# Patient Record
Sex: Female | Born: 1935 | Race: White | Hispanic: No | Smoking: Never smoker
Health system: Southern US, Community
[De-identification: ages and names within clinical notes are randomized; demographics above are authoritative.]

## PROBLEM LIST (undated history)

## (undated) DIAGNOSIS — I8290 Acute embolism and thrombosis of unspecified vein: Secondary | ICD-10-CM

## (undated) DIAGNOSIS — I10 Essential (primary) hypertension: Secondary | ICD-10-CM

## (undated) DIAGNOSIS — T4145XA Adverse effect of unspecified anesthetic, initial encounter: Secondary | ICD-10-CM

## (undated) DIAGNOSIS — R112 Nausea with vomiting, unspecified: Secondary | ICD-10-CM

## (undated) DIAGNOSIS — K219 Gastro-esophageal reflux disease without esophagitis: Secondary | ICD-10-CM

## (undated) DIAGNOSIS — T8859XA Other complications of anesthesia, initial encounter: Secondary | ICD-10-CM

## (undated) DIAGNOSIS — M353 Polymyalgia rheumatica: Secondary | ICD-10-CM

## (undated) DIAGNOSIS — I2510 Atherosclerotic heart disease of native coronary artery without angina pectoris: Secondary | ICD-10-CM

## (undated) DIAGNOSIS — R1310 Dysphagia, unspecified: Secondary | ICD-10-CM

## (undated) DIAGNOSIS — G2581 Restless legs syndrome: Secondary | ICD-10-CM

## (undated) DIAGNOSIS — F0390 Unspecified dementia without behavioral disturbance: Secondary | ICD-10-CM

## (undated) DIAGNOSIS — Z86711 Personal history of pulmonary embolism: Secondary | ICD-10-CM

## (undated) DIAGNOSIS — R001 Bradycardia, unspecified: Secondary | ICD-10-CM

## (undated) DIAGNOSIS — C801 Malignant (primary) neoplasm, unspecified: Secondary | ICD-10-CM

## (undated) DIAGNOSIS — R0600 Dyspnea, unspecified: Secondary | ICD-10-CM

## (undated) DIAGNOSIS — F419 Anxiety disorder, unspecified: Secondary | ICD-10-CM

## (undated) DIAGNOSIS — Z9889 Other specified postprocedural states: Secondary | ICD-10-CM

## (undated) DIAGNOSIS — R29898 Other symptoms and signs involving the musculoskeletal system: Secondary | ICD-10-CM

## (undated) DIAGNOSIS — D649 Anemia, unspecified: Secondary | ICD-10-CM

## (undated) DIAGNOSIS — L02419 Cutaneous abscess of limb, unspecified: Secondary | ICD-10-CM

## (undated) DIAGNOSIS — I200 Unstable angina: Secondary | ICD-10-CM

## (undated) DIAGNOSIS — S32020A Wedge compression fracture of second lumbar vertebra, initial encounter for closed fracture: Secondary | ICD-10-CM

## (undated) HISTORY — DX: Dyspnea, unspecified: R06.00

## (undated) HISTORY — PX: INCISION AND DRAINAGE: SHX5863

## (undated) HISTORY — DX: Dysphagia, unspecified: R13.10

## (undated) HISTORY — DX: Cutaneous abscess of limb, unspecified: L02.419

## (undated) HISTORY — DX: Anemia, unspecified: D64.9

## (undated) HISTORY — DX: Bradycardia, unspecified: R00.1

## (undated) HISTORY — DX: Personal history of pulmonary embolism: Z86.711

## (undated) HISTORY — DX: Unstable angina: I20.0

## (undated) HISTORY — PX: OTHER SURGICAL HISTORY: SHX169

## (undated) HISTORY — PX: COLECTOMY: SHX59

## (undated) HISTORY — DX: Wedge compression fracture of second lumbar vertebra, initial encounter for closed fracture: S32.020A

## (undated) HISTORY — DX: Other symptoms and signs involving the musculoskeletal system: R29.898

## (undated) HISTORY — PX: BREAST LUMPECTOMY: SHX2

---

## 1998-09-23 ENCOUNTER — Ambulatory Visit (HOSPITAL_COMMUNITY): Admission: RE | Admit: 1998-09-23 | Discharge: 1998-09-23 | Payer: Self-pay | Admitting: *Deleted

## 1998-12-24 ENCOUNTER — Ambulatory Visit (HOSPITAL_COMMUNITY): Admission: RE | Admit: 1998-12-24 | Discharge: 1998-12-24 | Payer: Self-pay | Admitting: *Deleted

## 1999-01-10 ENCOUNTER — Emergency Department (HOSPITAL_COMMUNITY): Admission: EM | Admit: 1999-01-10 | Discharge: 1999-01-10 | Payer: Self-pay | Admitting: Emergency Medicine

## 1999-01-10 ENCOUNTER — Encounter: Payer: Self-pay | Admitting: Emergency Medicine

## 1999-11-11 ENCOUNTER — Other Ambulatory Visit: Admission: RE | Admit: 1999-11-11 | Discharge: 1999-11-11 | Payer: Self-pay | Admitting: *Deleted

## 2000-03-25 ENCOUNTER — Encounter: Payer: Self-pay | Admitting: Cardiology

## 2000-03-25 ENCOUNTER — Inpatient Hospital Stay (HOSPITAL_COMMUNITY): Admission: AD | Admit: 2000-03-25 | Discharge: 2000-03-30 | Payer: Self-pay | Admitting: Cardiology

## 2000-05-12 ENCOUNTER — Encounter: Payer: Self-pay | Admitting: Emergency Medicine

## 2000-05-12 ENCOUNTER — Emergency Department (HOSPITAL_COMMUNITY): Admission: EM | Admit: 2000-05-12 | Discharge: 2000-05-12 | Payer: Self-pay | Admitting: Emergency Medicine

## 2000-11-08 ENCOUNTER — Ambulatory Visit (HOSPITAL_COMMUNITY): Admission: RE | Admit: 2000-11-08 | Discharge: 2000-11-08 | Payer: Self-pay | Admitting: *Deleted

## 2000-11-08 ENCOUNTER — Encounter (INDEPENDENT_AMBULATORY_CARE_PROVIDER_SITE_OTHER): Payer: Self-pay | Admitting: *Deleted

## 2001-01-04 ENCOUNTER — Encounter: Payer: Self-pay | Admitting: Emergency Medicine

## 2001-01-04 ENCOUNTER — Inpatient Hospital Stay (HOSPITAL_COMMUNITY): Admission: EM | Admit: 2001-01-04 | Discharge: 2001-01-10 | Payer: Self-pay | Admitting: Emergency Medicine

## 2001-05-08 ENCOUNTER — Observation Stay (HOSPITAL_COMMUNITY): Admission: EM | Admit: 2001-05-08 | Discharge: 2001-05-10 | Payer: Self-pay | Admitting: *Deleted

## 2001-05-09 ENCOUNTER — Encounter: Payer: Self-pay | Admitting: Cardiology

## 2001-08-08 ENCOUNTER — Ambulatory Visit (HOSPITAL_COMMUNITY): Admission: RE | Admit: 2001-08-08 | Discharge: 2001-08-09 | Payer: Self-pay | Admitting: Cardiology

## 2001-08-08 ENCOUNTER — Encounter: Payer: Self-pay | Admitting: Cardiology

## 2003-02-18 ENCOUNTER — Inpatient Hospital Stay (HOSPITAL_COMMUNITY): Admission: AD | Admit: 2003-02-18 | Discharge: 2003-02-20 | Payer: Self-pay | Admitting: Internal Medicine

## 2003-02-18 ENCOUNTER — Encounter: Payer: Self-pay | Admitting: Internal Medicine

## 2003-02-28 ENCOUNTER — Observation Stay (HOSPITAL_COMMUNITY): Admission: AD | Admit: 2003-02-28 | Discharge: 2003-03-01 | Payer: Self-pay | Admitting: Cardiology

## 2003-02-28 ENCOUNTER — Encounter: Payer: Self-pay | Admitting: Cardiology

## 2003-03-01 ENCOUNTER — Encounter: Payer: Self-pay | Admitting: Cardiology

## 2003-03-20 ENCOUNTER — Inpatient Hospital Stay (HOSPITAL_COMMUNITY): Admission: RE | Admit: 2003-03-20 | Discharge: 2003-03-27 | Payer: Self-pay | Admitting: Internal Medicine

## 2003-03-20 ENCOUNTER — Encounter: Payer: Self-pay | Admitting: Internal Medicine

## 2003-10-10 ENCOUNTER — Ambulatory Visit (HOSPITAL_COMMUNITY): Admission: RE | Admit: 2003-10-10 | Discharge: 2003-10-10 | Payer: Self-pay | Admitting: Internal Medicine

## 2003-11-08 ENCOUNTER — Ambulatory Visit (HOSPITAL_COMMUNITY): Admission: RE | Admit: 2003-11-08 | Discharge: 2003-11-08 | Payer: Self-pay | Admitting: Internal Medicine

## 2003-11-26 ENCOUNTER — Ambulatory Visit (HOSPITAL_COMMUNITY): Admission: RE | Admit: 2003-11-26 | Discharge: 2003-11-26 | Payer: Self-pay | Admitting: *Deleted

## 2003-11-26 ENCOUNTER — Encounter (INDEPENDENT_AMBULATORY_CARE_PROVIDER_SITE_OTHER): Payer: Self-pay | Admitting: Specialist

## 2004-01-19 ENCOUNTER — Inpatient Hospital Stay (HOSPITAL_COMMUNITY): Admission: EM | Admit: 2004-01-19 | Discharge: 2004-01-22 | Payer: Self-pay | Admitting: Emergency Medicine

## 2004-03-28 ENCOUNTER — Observation Stay (HOSPITAL_COMMUNITY): Admission: EM | Admit: 2004-03-28 | Discharge: 2004-03-30 | Payer: Self-pay | Admitting: Emergency Medicine

## 2004-09-16 ENCOUNTER — Ambulatory Visit: Payer: Self-pay | Admitting: Cardiology

## 2005-01-06 ENCOUNTER — Encounter: Admission: RE | Admit: 2005-01-06 | Discharge: 2005-01-06 | Payer: Self-pay | Admitting: Surgery

## 2005-01-06 ENCOUNTER — Encounter (INDEPENDENT_AMBULATORY_CARE_PROVIDER_SITE_OTHER): Payer: Self-pay | Admitting: Specialist

## 2005-01-06 ENCOUNTER — Encounter (INDEPENDENT_AMBULATORY_CARE_PROVIDER_SITE_OTHER): Payer: Self-pay | Admitting: Diagnostic Radiology

## 2005-01-20 ENCOUNTER — Encounter: Admission: RE | Admit: 2005-01-20 | Discharge: 2005-01-20 | Payer: Self-pay | Admitting: Obstetrics and Gynecology

## 2005-01-29 ENCOUNTER — Ambulatory Visit: Payer: Self-pay | Admitting: Cardiology

## 2005-02-08 ENCOUNTER — Ambulatory Visit: Payer: Self-pay

## 2005-02-15 ENCOUNTER — Encounter: Admission: RE | Admit: 2005-02-15 | Discharge: 2005-02-15 | Payer: Self-pay | Admitting: Surgery

## 2005-02-15 ENCOUNTER — Ambulatory Visit (HOSPITAL_COMMUNITY): Admission: RE | Admit: 2005-02-15 | Discharge: 2005-02-16 | Payer: Self-pay | Admitting: Surgery

## 2005-02-15 ENCOUNTER — Encounter (INDEPENDENT_AMBULATORY_CARE_PROVIDER_SITE_OTHER): Payer: Self-pay | Admitting: *Deleted

## 2005-02-17 ENCOUNTER — Ambulatory Visit: Payer: Self-pay | Admitting: Oncology

## 2005-02-22 ENCOUNTER — Ambulatory Visit: Payer: Self-pay | Admitting: Cardiology

## 2005-02-24 ENCOUNTER — Emergency Department (HOSPITAL_COMMUNITY): Admission: EM | Admit: 2005-02-24 | Discharge: 2005-02-24 | Payer: Self-pay | Admitting: Emergency Medicine

## 2005-03-08 ENCOUNTER — Ambulatory Visit: Admission: RE | Admit: 2005-03-08 | Discharge: 2005-05-25 | Payer: Self-pay | Admitting: *Deleted

## 2005-04-13 ENCOUNTER — Ambulatory Visit: Payer: Self-pay | Admitting: Oncology

## 2005-07-01 ENCOUNTER — Ambulatory Visit: Payer: Self-pay | Admitting: Oncology

## 2005-07-08 ENCOUNTER — Ambulatory Visit: Payer: Self-pay | Admitting: Internal Medicine

## 2005-09-03 ENCOUNTER — Ambulatory Visit: Payer: Self-pay | Admitting: Cardiology

## 2005-11-10 ENCOUNTER — Ambulatory Visit: Payer: Self-pay | Admitting: *Deleted

## 2005-12-16 ENCOUNTER — Ambulatory Visit: Payer: Self-pay | Admitting: Cardiology

## 2005-12-21 ENCOUNTER — Ambulatory Visit: Payer: Self-pay | Admitting: Oncology

## 2006-02-03 ENCOUNTER — Ambulatory Visit (HOSPITAL_COMMUNITY): Admission: RE | Admit: 2006-02-03 | Discharge: 2006-02-03 | Payer: Self-pay | Admitting: *Deleted

## 2006-02-03 ENCOUNTER — Encounter (INDEPENDENT_AMBULATORY_CARE_PROVIDER_SITE_OTHER): Payer: Self-pay | Admitting: Specialist

## 2006-02-18 ENCOUNTER — Encounter: Payer: Self-pay | Admitting: Vascular Surgery

## 2006-02-18 ENCOUNTER — Ambulatory Visit (HOSPITAL_COMMUNITY): Admission: RE | Admit: 2006-02-18 | Discharge: 2006-02-18 | Payer: Self-pay | Admitting: Internal Medicine

## 2006-02-24 ENCOUNTER — Other Ambulatory Visit: Admission: RE | Admit: 2006-02-24 | Discharge: 2006-02-24 | Payer: Self-pay | Admitting: Cardiology

## 2006-03-01 ENCOUNTER — Emergency Department (HOSPITAL_COMMUNITY): Admission: EM | Admit: 2006-03-01 | Discharge: 2006-03-02 | Payer: Self-pay | Admitting: Emergency Medicine

## 2006-03-04 ENCOUNTER — Encounter: Admission: RE | Admit: 2006-03-04 | Discharge: 2006-03-04 | Payer: Self-pay | Admitting: Surgery

## 2006-04-18 ENCOUNTER — Ambulatory Visit: Payer: Self-pay | Admitting: Oncology

## 2006-04-21 ENCOUNTER — Ambulatory Visit (HOSPITAL_COMMUNITY): Admission: RE | Admit: 2006-04-21 | Discharge: 2006-04-21 | Payer: Self-pay | Admitting: *Deleted

## 2006-04-21 ENCOUNTER — Encounter (INDEPENDENT_AMBULATORY_CARE_PROVIDER_SITE_OTHER): Payer: Self-pay | Admitting: Specialist

## 2006-05-02 ENCOUNTER — Ambulatory Visit: Payer: Self-pay | Admitting: Cardiology

## 2006-05-18 LAB — CBC WITH DIFFERENTIAL/PLATELET
BASO%: 0.6 % (ref 0.0–2.0)
Basophils Absolute: 0 10*3/uL (ref 0.0–0.1)
Eosinophils Absolute: 0.1 10*3/uL (ref 0.0–0.5)
HCT: 39.1 % (ref 34.8–46.6)
HGB: 12.9 g/dL (ref 11.6–15.9)
MCHC: 33 g/dL (ref 32.0–36.0)
MONO#: 0.4 10*3/uL (ref 0.1–0.9)
NEUT#: 4 10*3/uL (ref 1.5–6.5)
NEUT%: 70.7 % (ref 39.6–76.8)
WBC: 5.7 10*3/uL (ref 3.9–10.0)
lymph#: 1.1 10*3/uL (ref 0.9–3.3)

## 2006-05-18 LAB — COMPREHENSIVE METABOLIC PANEL
ALT: 9 U/L (ref 0–40)
CO2: 23 mEq/L (ref 19–32)
Calcium: 9.3 mg/dL (ref 8.4–10.5)
Chloride: 102 mEq/L (ref 96–112)
Creatinine, Ser: 0.77 mg/dL (ref 0.40–1.20)
Total Protein: 6.6 g/dL (ref 6.0–8.3)

## 2006-05-18 LAB — CANCER ANTIGEN 27.29: CA 27.29: 23 U/mL (ref 0–39)

## 2006-07-15 ENCOUNTER — Encounter: Admission: RE | Admit: 2006-07-15 | Discharge: 2006-07-15 | Payer: Self-pay | Admitting: Internal Medicine

## 2006-09-01 ENCOUNTER — Encounter: Admission: RE | Admit: 2006-09-01 | Discharge: 2006-09-01 | Payer: Self-pay | Admitting: Internal Medicine

## 2006-09-12 ENCOUNTER — Ambulatory Visit: Payer: Self-pay | Admitting: Oncology

## 2006-09-14 LAB — CBC WITH DIFFERENTIAL/PLATELET
EOS%: 0.8 % (ref 0.0–7.0)
Eosinophils Absolute: 0.1 10*3/uL (ref 0.0–0.5)
MCV: 85.5 fL (ref 81.0–101.0)
MONO%: 2.7 % (ref 0.0–13.0)
NEUT#: 11.1 10*3/uL — ABNORMAL HIGH (ref 1.5–6.5)
RBC: 4.47 10*6/uL (ref 3.70–5.32)
RDW: 15.2 % — ABNORMAL HIGH (ref 11.3–14.5)
lymph#: 0.4 10*3/uL — ABNORMAL LOW (ref 0.9–3.3)

## 2006-09-20 LAB — ANA: Anti Nuclear Antibody(ANA): NEGATIVE

## 2006-09-20 LAB — COMPREHENSIVE METABOLIC PANEL
Alkaline Phosphatase: 42 U/L (ref 39–117)
BUN: 17 mg/dL (ref 6–23)
Creatinine, Ser: 0.87 mg/dL (ref 0.40–1.20)
Glucose, Bld: 116 mg/dL — ABNORMAL HIGH (ref 70–99)
Total Bilirubin: 0.5 mg/dL (ref 0.3–1.2)

## 2006-09-30 ENCOUNTER — Ambulatory Visit: Payer: Self-pay | Admitting: Cardiology

## 2006-11-28 ENCOUNTER — Encounter: Admission: RE | Admit: 2006-11-28 | Discharge: 2006-11-28 | Payer: Self-pay | Admitting: Internal Medicine

## 2007-01-09 ENCOUNTER — Ambulatory Visit: Payer: Self-pay | Admitting: Oncology

## 2007-01-19 LAB — CBC WITH DIFFERENTIAL/PLATELET
Basophils Absolute: 0 10*3/uL (ref 0.0–0.1)
EOS%: 0.1 % (ref 0.0–7.0)
HCT: 40.5 % (ref 34.8–46.6)
HGB: 13.4 g/dL (ref 11.6–15.9)
LYMPH%: 6.8 % — ABNORMAL LOW (ref 14.0–48.0)
MCH: 28.7 pg (ref 26.0–34.0)
MCHC: 33 g/dL (ref 32.0–36.0)
MONO#: 0.3 10*3/uL (ref 0.1–0.9)
NEUT%: 90.8 % — ABNORMAL HIGH (ref 39.6–76.8)
Platelets: 348 10*3/uL (ref 145–400)
lymph#: 0.8 10*3/uL — ABNORMAL LOW (ref 0.9–3.3)

## 2007-01-19 LAB — COMPREHENSIVE METABOLIC PANEL
BUN: 11 mg/dL (ref 6–23)
CO2: 23 mEq/L (ref 19–32)
Calcium: 9.6 mg/dL (ref 8.4–10.5)
Chloride: 96 mEq/L (ref 96–112)
Creatinine, Ser: 0.85 mg/dL (ref 0.40–1.20)
Total Bilirubin: 0.4 mg/dL (ref 0.3–1.2)

## 2007-01-19 LAB — LACTATE DEHYDROGENASE: LDH: 119 U/L (ref 94–250)

## 2007-04-27 ENCOUNTER — Ambulatory Visit: Payer: Self-pay | Admitting: Cardiology

## 2007-05-16 ENCOUNTER — Ambulatory Visit: Payer: Self-pay | Admitting: Oncology

## 2007-08-24 ENCOUNTER — Ambulatory Visit: Payer: Self-pay | Admitting: Cardiology

## 2007-09-03 ENCOUNTER — Emergency Department (HOSPITAL_COMMUNITY): Admission: EM | Admit: 2007-09-03 | Discharge: 2007-09-03 | Payer: Self-pay | Admitting: Emergency Medicine

## 2007-11-30 ENCOUNTER — Encounter: Admission: RE | Admit: 2007-11-30 | Discharge: 2007-11-30 | Payer: Self-pay | Admitting: Internal Medicine

## 2007-12-01 ENCOUNTER — Ambulatory Visit: Payer: Self-pay | Admitting: Cardiology

## 2007-12-04 ENCOUNTER — Ambulatory Visit: Payer: Self-pay | Admitting: Internal Medicine

## 2007-12-18 ENCOUNTER — Ambulatory Visit: Payer: Self-pay | Admitting: Cardiology

## 2007-12-28 ENCOUNTER — Ambulatory Visit (HOSPITAL_COMMUNITY): Admission: RE | Admit: 2007-12-28 | Discharge: 2007-12-28 | Payer: Self-pay | Admitting: *Deleted

## 2007-12-28 ENCOUNTER — Encounter (INDEPENDENT_AMBULATORY_CARE_PROVIDER_SITE_OTHER): Payer: Self-pay | Admitting: *Deleted

## 2008-04-08 ENCOUNTER — Ambulatory Visit: Payer: Self-pay | Admitting: Oncology

## 2008-04-10 LAB — CBC WITH DIFFERENTIAL/PLATELET
BASO%: 0.3 % (ref 0.0–2.0)
Basophils Absolute: 0 10*3/uL (ref 0.0–0.1)
Eosinophils Absolute: 0.1 10*3/uL (ref 0.0–0.5)
HCT: 41.2 % (ref 34.8–46.6)
HGB: 13.8 g/dL (ref 11.6–15.9)
LYMPH%: 17.9 % (ref 14.0–48.0)
MONO#: 0.6 10*3/uL (ref 0.1–0.9)
NEUT#: 7.2 10*3/uL — ABNORMAL HIGH (ref 1.5–6.5)
NEUT%: 74.6 % (ref 39.6–76.8)
Platelets: 261 10*3/uL (ref 145–400)
WBC: 9.7 10*3/uL (ref 3.9–10.0)
lymph#: 1.7 10*3/uL (ref 0.9–3.3)

## 2008-04-10 LAB — COMPREHENSIVE METABOLIC PANEL
ALT: 12 U/L (ref 0–35)
BUN: 12 mg/dL (ref 6–23)
CO2: 24 mEq/L (ref 19–32)
Calcium: 9.2 mg/dL (ref 8.4–10.5)
Chloride: 102 mEq/L (ref 96–112)
Creatinine, Ser: 0.89 mg/dL (ref 0.40–1.20)
Glucose, Bld: 105 mg/dL — ABNORMAL HIGH (ref 70–99)

## 2008-04-10 LAB — CANCER ANTIGEN 27.29: CA 27.29: 23 U/mL (ref 0–39)

## 2008-04-10 LAB — LACTATE DEHYDROGENASE: LDH: 142 U/L (ref 94–250)

## 2008-04-24 ENCOUNTER — Ambulatory Visit: Payer: Self-pay | Admitting: Cardiology

## 2008-07-30 ENCOUNTER — Ambulatory Visit: Payer: Self-pay | Admitting: Cardiology

## 2008-08-09 ENCOUNTER — Encounter: Admission: RE | Admit: 2008-08-09 | Discharge: 2008-08-09 | Payer: Self-pay | Admitting: Internal Medicine

## 2008-09-12 ENCOUNTER — Ambulatory Visit (HOSPITAL_COMMUNITY): Admission: RE | Admit: 2008-09-12 | Discharge: 2008-09-12 | Payer: Self-pay | Admitting: Internal Medicine

## 2008-10-01 ENCOUNTER — Ambulatory Visit: Payer: Self-pay | Admitting: Oncology

## 2008-11-18 ENCOUNTER — Ambulatory Visit: Payer: Self-pay | Admitting: Cardiology

## 2008-11-18 ENCOUNTER — Observation Stay (HOSPITAL_COMMUNITY): Admission: EM | Admit: 2008-11-18 | Discharge: 2008-11-20 | Payer: Self-pay | Admitting: Emergency Medicine

## 2008-12-11 ENCOUNTER — Ambulatory Visit: Payer: Self-pay | Admitting: Cardiology

## 2008-12-11 DIAGNOSIS — E785 Hyperlipidemia, unspecified: Secondary | ICD-10-CM

## 2008-12-11 DIAGNOSIS — I10 Essential (primary) hypertension: Secondary | ICD-10-CM | POA: Insufficient documentation

## 2008-12-17 ENCOUNTER — Ambulatory Visit: Payer: Self-pay

## 2009-01-01 ENCOUNTER — Ambulatory Visit: Payer: Self-pay | Admitting: Cardiology

## 2009-02-11 ENCOUNTER — Telehealth: Payer: Self-pay | Admitting: Cardiology

## 2009-04-15 ENCOUNTER — Emergency Department (HOSPITAL_COMMUNITY): Admission: EM | Admit: 2009-04-15 | Discharge: 2009-04-15 | Payer: Self-pay | Admitting: Emergency Medicine

## 2009-05-08 ENCOUNTER — Encounter: Admission: RE | Admit: 2009-05-08 | Discharge: 2009-05-08 | Payer: Self-pay | Admitting: Internal Medicine

## 2009-08-22 ENCOUNTER — Encounter (INDEPENDENT_AMBULATORY_CARE_PROVIDER_SITE_OTHER): Payer: Self-pay | Admitting: *Deleted

## 2009-08-27 ENCOUNTER — Ambulatory Visit: Payer: Self-pay | Admitting: Cardiology

## 2009-08-27 DIAGNOSIS — E7800 Pure hypercholesterolemia, unspecified: Secondary | ICD-10-CM | POA: Insufficient documentation

## 2009-09-05 ENCOUNTER — Encounter: Payer: Self-pay | Admitting: Cardiology

## 2010-09-28 ENCOUNTER — Ambulatory Visit: Payer: Self-pay | Admitting: Cardiology

## 2010-10-01 DIAGNOSIS — I8290 Acute embolism and thrombosis of unspecified vein: Secondary | ICD-10-CM

## 2010-10-01 HISTORY — DX: Acute embolism and thrombosis of unspecified vein: I82.90

## 2010-10-05 ENCOUNTER — Encounter: Admission: RE | Admit: 2010-10-05 | Discharge: 2010-10-05 | Payer: Self-pay | Admitting: Internal Medicine

## 2010-10-05 ENCOUNTER — Encounter: Payer: Self-pay | Admitting: Cardiology

## 2010-10-05 ENCOUNTER — Inpatient Hospital Stay (HOSPITAL_COMMUNITY)
Admission: EM | Admit: 2010-10-05 | Discharge: 2010-10-08 | Payer: Self-pay | Source: Home / Self Care | Attending: Internal Medicine | Admitting: Internal Medicine

## 2010-10-06 ENCOUNTER — Encounter (INDEPENDENT_AMBULATORY_CARE_PROVIDER_SITE_OTHER): Payer: Self-pay | Admitting: Internal Medicine

## 2010-11-21 ENCOUNTER — Encounter: Payer: Self-pay | Admitting: Internal Medicine

## 2010-11-22 ENCOUNTER — Encounter: Payer: Self-pay | Admitting: Oncology

## 2010-11-22 ENCOUNTER — Encounter: Payer: Self-pay | Admitting: Internal Medicine

## 2010-11-22 ENCOUNTER — Encounter: Payer: Self-pay | Admitting: *Deleted

## 2010-12-03 NOTE — Letter (Signed)
Summary: [facility]  [facility]   Imported By: Marylou Mccoy 10/09/2010 12:55:52  _____________________________________________________________________  External Attachment:    Type:   Image     Comment:   External Document

## 2010-12-03 NOTE — Assessment & Plan Note (Signed)
Summary: f1y   Visit Type:  Follow-up Primary Monseratt Ledin:  Renne Crigler  CC:  sob.  History of Present Illness: Doing well on cardiac basis. Denies any chest pain whatsoever.  Feels good overall.  Has taken Protonix in the past, but not recently.  Likes nexium   Problems Prior to Update: 1)  Hypercholesterolemia Iia  (ICD-272.0) 2)  Hyperlipidemia-mixed  (ICD-272.4) 3)  Hypertension, Benign  (ICD-401.1) 4)  Cad, Native Vessel  (ICD-414.01)  Current Medications (verified): 1)  Nitroglycerin 0.4 Mg Subl (Nitroglycerin) 2)  Nexium 40 Mg Cpdr (Esomeprazole Magnesium) .... Take 1 Capsule By Mouth Once A Day 3)  Ambien 5 Mg Tabs (Zolpidem Tartrate) .... 1/2 To 1 Tablet At Bedtime 4)  Plavix 75 Mg Tabs (Clopidogrel Bisulfate) .... Take One Tablet By Mouth Daily 5)  Aspirin 81 Mg Tbec (Aspirin) .... Take One Tablet By Mouth Daily 6)  Amlodipine Besylate 10 Mg Tabs (Amlodipine Besylate) .... Take One Daily 7)  Requip 0.5 Mg Tabs (Ropinirole Hcl) .... Take 1 Tab By Mouth At Bedtime 8)  Vitamin D 1000 Unit  Tabs (Cholecalciferol) .... Take 1 Tablet By Mouth Once A Day 9)  Prednisone 1 Mg Tabs (Prednisone) .... Take 1 Tablet By Mouth Two Times A Day 10)  Calcium Carbonate-Vitamin D 600-400 Mg-Unit  Tabs (Calcium Carbonate-Vitamin D) .... Take 1 Tablet By Mouth Once A Day 11)  Tramadol Hcl 50 Mg Tabs (Tramadol Hcl) .... As Needed 12)  Tylenol 325 Mg Tabs (Acetaminophen) .... As Needed 13)  Lipitor 20 Mg Tabs (Atorvastatin Calcium) .... Take One Every Other Night 14)  Losartan Potassium 100 Mg Tabs (Losartan Potassium) .... 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  Alcohol Use - no  Vital Signs:  Patient profile:   77 year old female Height:      61 inches Weight:      133 pounds BMI:     25.22 Pulse rate:   60 / minute Pulse rhythm:   regular BP sitting:   122 / 68  (left arm)  Vitals Entered By: Jacquelin Hawking, CMA (September 28, 2010 10:32 AM)  Physical Exam  General:  Well developed, well nourished, in no acute distress. Head:  normocephalic and atraumatic Eyes:  PERRLA/EOM intact; conjunctiva and lids normal. Lungs:  Clear bilaterally to auscultation and percussion. Heart:  PMI non displaced.  Normal S1 and S2.  Soft SEM.  No DM noted.   Pulses:  pulses normal in all 4 extremities Extremities:  No clubbing or cyanosis. Neurologic:  Alert and oriented x 3.   Impression & Recommendations:  Problem # 1:  CAD, NATIVE VESSEL (ICD-414.01) No symptoms.  Continues to do well.  Will replace Nexium with protonix for a trial given Plavix.  The following medications were removed from the medication list:    Imdur 60 Mg Xr24h-tab (Isosorbide mononitrate) .Marland Kitchen... Take 1 tablet by mouth once a day Her updated medication list for this problem includes:    Nitroglycerin 0.4 Mg Subl (Nitroglycerin)    Plavix 75 Mg Tabs (Clopidogrel bisulfate) .Marland Kitchen... Take one tablet by mouth daily    Aspirin 81 Mg Tbec  (Aspirin) .Marland Kitchen... Take one tablet by mouth daily    Amlodipine Besylate 10 Mg Tabs (Amlodipine besylate) .Marland Kitchen... Take one daily  Problem # 2:  HYPERLIPIDEMIA-MIXED (ICD-272.4) followed by Dr. Renne Crigler The following medications were removed from the medication list:    Zocor 40 Mg Tabs (Simvastatin) .Marland Kitchen... Take 1 tablet by mouth once a day Her updated medication list for this problem includes:    Lipitor 20 Mg Tabs (Atorvastatin calcium) .Marland Kitchen... Take one every other night  Problem # 3:  HYPERTENSION, BENIGN (ICD-401.1) controlled. Her updated medication list for this problem includes:    Aspirin 81 Mg Tbec (Aspirin) .Marland Kitchen... Take one tablet by mouth daily    Amlodipine Besylate 10 Mg Tabs (Amlodipine besylate) .Marland Kitchen... Take one daily    Losartan Potassium 100 Mg Tabs (Losartan potassium) .Marland Kitchen... Take 1/2 daily  Patient Instructions: 1)  Your physician has recommended you make the following change in your medication: STOP Nexium, START Pantoprazole 40mg  one tablet daily 2)  Your physician wants you to follow-up in:   1YEAR. You will receive a reminder letter in the mail two months in advance. If you don't receive a letter, please call our office to schedule the follow-up appointment. Prescriptions: PANTOPRAZOLE SODIUM 40 MG TBEC (PANTOPRAZOLE SODIUM) take one tablet by mouth once daily  #30 x 2   Entered by:   Julieta Gutting, RN, BSN   Authorized by:   Ronaldo Miyamoto, MD, [facility]   Signed by:   Julieta Gutting, RN, BSN on 09/28/2010   Method used:   Electronically to        [facility]  [address] [id number] (retail)       [address]       [place], [place]  [address]       Ph: [phone] or [phone]       Fax: [phone]   RxID:   [id number]

## 2010-12-03 NOTE — Letter (Signed)
Summary: [facility] Office Note  [facility] Office Note   Imported By: Roderic Ovens 04/21/2010 13:48:21  _____________________________________________________________________  External Attachment:    Type:   Image     Comment:   External Document

## 2011-01-12 LAB — CARDIAC PANEL(CRET KIN+CKTOT+MB+TROPI)
Relative Index: INVALID (ref 0.0–2.5)
Relative Index: INVALID (ref 0.0–2.5)
Total CK: 48 U/L (ref 7–177)
Troponin I: 0.03 ng/mL (ref 0.00–0.06)
Troponin I: 0.04 ng/mL (ref 0.00–0.06)

## 2011-01-12 LAB — DIFFERENTIAL
Basophils Absolute: 0 10*3/uL (ref 0.0–0.1)
Basophils Absolute: 0.1 10*3/uL (ref 0.0–0.1)
Eosinophils Relative: 0 % (ref 0–5)
Lymphocytes Relative: 27 % (ref 12–46)
Lymphs Abs: 1.2 10*3/uL (ref 0.7–4.0)
Monocytes Absolute: 0.7 10*3/uL (ref 0.1–1.0)
Monocytes Relative: 8 % (ref 3–12)
Neutro Abs: 5.6 10*3/uL (ref 1.7–7.7)
Neutro Abs: 7.9 10*3/uL — ABNORMAL HIGH (ref 1.7–7.7)

## 2011-01-12 LAB — CBC
HCT: 34.9 % — ABNORMAL LOW (ref 36.0–46.0)
HCT: 35 % — ABNORMAL LOW (ref 36.0–46.0)
HCT: 40.2 % (ref 36.0–46.0)
MCH: 28.7 pg (ref 26.0–34.0)
MCH: 28.9 pg (ref 26.0–34.0)
MCH: 29.6 pg (ref 26.0–34.0)
MCHC: 32.6 g/dL (ref 30.0–36.0)
MCHC: 32.6 g/dL (ref 30.0–36.0)
MCV: 87.5 fL (ref 78.0–100.0)
MCV: 88 fL (ref 78.0–100.0)
MCV: 88.2 fL (ref 78.0–100.0)
MCV: 88.8 fL (ref 78.0–100.0)
Platelets: 235 10*3/uL (ref 150–400)
Platelets: 239 10*3/uL (ref 150–400)
RBC: 3.99 MIL/uL (ref 3.87–5.11)
RBC: 4.09 MIL/uL (ref 3.87–5.11)
RDW: 13.7 % (ref 11.5–15.5)
RDW: 13.8 % (ref 11.5–15.5)
RDW: 13.9 % (ref 11.5–15.5)
WBC: 10 10*3/uL (ref 4.0–10.5)
WBC: 7.9 10*3/uL (ref 4.0–10.5)

## 2011-01-12 LAB — BASIC METABOLIC PANEL
BUN: 7 mg/dL (ref 6–23)
Chloride: 104 mEq/L (ref 96–112)
Creatinine, Ser: 0.86 mg/dL (ref 0.4–1.2)
Glucose, Bld: 110 mg/dL — ABNORMAL HIGH (ref 70–99)
Potassium: 3.8 mEq/L (ref 3.5–5.1)

## 2011-01-12 LAB — PROTHROMBIN GENE MUTATION

## 2011-01-12 LAB — PROTEIN S ACTIVITY: Protein S Activity: 96 % (ref 69–129)

## 2011-01-12 LAB — PROTIME-INR: INR: 1.21 (ref 0.00–1.49)

## 2011-01-12 LAB — COMPREHENSIVE METABOLIC PANEL
Albumin: 3.1 g/dL — ABNORMAL LOW (ref 3.5–5.2)
BUN: 7 mg/dL (ref 6–23)
Chloride: 109 mEq/L (ref 96–112)
Creatinine, Ser: 0.72 mg/dL (ref 0.4–1.2)
GFR calc non Af Amer: >60 mL/min (ref >60)
Total Bilirubin: 0.5 mg/dL (ref 0.3–1.2)

## 2011-01-12 LAB — CK TOTAL AND CKMB (NOT AT ARMC)
CK, MB: 0.9 ng/mL (ref 0.3–4.0)
Relative Index: INVALID (ref 0.0–2.5)
Total CK: 41 U/L (ref 7–177)

## 2011-01-12 LAB — LUPUS ANTICOAGULANT PANEL
PTT Lupus Anticoagulant: 168.2 secs — ABNORMAL HIGH (ref 30.0–45.6)
PTTLA 4:1 Mix: 138.8 secs — ABNORMAL HIGH (ref 30.0–45.6)
PTTLA Confirmation: 0.1 secs (ref <8.0)

## 2011-01-12 LAB — HEPARIN LEVEL (UNFRACTIONATED): Heparin Unfractionated: 0.65 IU/mL (ref 0.30–0.70)

## 2011-01-12 LAB — FACTOR 5 LEIDEN

## 2011-01-12 LAB — HOMOCYSTEINE: Homocysteine: 12.7 umol/L (ref 4.0–15.4)

## 2011-01-12 LAB — PROTEIN C ACTIVITY: Protein C Activity: 199 % — ABNORMAL HIGH (ref 75–133)

## 2011-02-08 LAB — DIFFERENTIAL
Eosinophils Relative: 2 % (ref 0–5)
Lymphocytes Relative: 24 % (ref 12–46)
Lymphs Abs: 1.5 10*3/uL (ref 0.7–4.0)
Monocytes Absolute: 0.4 10*3/uL (ref 0.1–1.0)
Monocytes Relative: 7 % (ref 3–12)
Neutro Abs: 4 10*3/uL (ref 1.7–7.7)

## 2011-02-08 LAB — CBC
HCT: 37.4 % (ref 36.0–46.0)
Hemoglobin: 12.5 g/dL (ref 12.0–15.0)
Platelets: 199 10*3/uL (ref 150–400)
WBC: 6.1 10*3/uL (ref 4.0–10.5)

## 2011-02-08 LAB — COMPREHENSIVE METABOLIC PANEL
AST: 15 U/L (ref 0–37)
Albumin: 3.5 g/dL (ref 3.5–5.2)
BUN: 9 mg/dL (ref 6–23)
Calcium: 8.7 mg/dL (ref 8.4–10.5)
Chloride: 107 mEq/L (ref 96–112)
Creatinine, Ser: 0.84 mg/dL (ref 0.4–1.2)
GFR calc Af Amer: >60 mL/min (ref >60)
Total Bilirubin: 0.4 mg/dL (ref 0.3–1.2)
Total Protein: 6 g/dL (ref 6.0–8.3)

## 2011-02-08 LAB — URINALYSIS, ROUTINE W REFLEX MICROSCOPIC
Glucose, UA: NEGATIVE mg/dL
Nitrite: NEGATIVE
Protein, ur: NEGATIVE mg/dL
pH: 7.5 (ref 5.0–8.0)

## 2011-02-08 LAB — CK TOTAL AND CKMB (NOT AT ARMC)
CK, MB: 0.8 ng/mL (ref 0.3–4.0)
Total CK: 45 U/L (ref 7–177)

## 2011-02-15 LAB — BASIC METABOLIC PANEL
BUN: 11 mg/dL (ref 6–23)
CO2: 23 mEq/L (ref 19–32)
GFR calc non Af Amer: >60 mL/min (ref >60)
Glucose, Bld: 126 mg/dL — ABNORMAL HIGH (ref 70–99)
Potassium: 4.1 mEq/L (ref 3.5–5.1)

## 2011-02-15 LAB — LIPID PANEL
HDL: 58 mg/dL (ref >39)
LDL Cholesterol: 105 mg/dL — ABNORMAL HIGH (ref 0–99)
Triglycerides: 55 mg/dL (ref <150)

## 2011-02-15 LAB — COMPREHENSIVE METABOLIC PANEL
ALT: 15 U/L (ref 0–35)
Albumin: 3.6 g/dL (ref 3.5–5.2)
Alkaline Phosphatase: 42 U/L (ref 39–117)
GFR calc Af Amer: >60 mL/min (ref >60)
Potassium: 3.1 mEq/L — ABNORMAL LOW (ref 3.5–5.1)
Sodium: 137 mEq/L (ref 135–145)
Total Protein: 6.6 g/dL (ref 6.0–8.3)

## 2011-02-15 LAB — CBC
Hemoglobin: 13.9 g/dL (ref 12.0–15.0)
MCHC: 33 g/dL (ref 30.0–36.0)
RBC: 4.9 MIL/uL (ref 3.87–5.11)
WBC: 7.4 10*3/uL (ref 4.0–10.5)

## 2011-02-15 LAB — DIFFERENTIAL
Basophils Relative: 0 % (ref 0–1)
Eosinophils Absolute: 0.1 10*3/uL (ref 0.0–0.7)
Lymphs Abs: 1.5 10*3/uL (ref 0.7–4.0)
Monocytes Absolute: 0.5 10*3/uL (ref 0.1–1.0)
Monocytes Relative: 6 % (ref 3–12)

## 2011-02-15 LAB — CK TOTAL AND CKMB (NOT AT ARMC)
CK, MB: 0.7 ng/mL (ref 0.3–4.0)
Total CK: 33 U/L (ref 7–177)

## 2011-02-15 LAB — CARDIAC PANEL(CRET KIN+CKTOT+MB+TROPI)
CK, MB: 1.1 ng/mL (ref 0.3–4.0)
Relative Index: INVALID (ref 0.0–2.5)
Total CK: 35 U/L (ref 7–177)
Total CK: 36 U/L (ref 7–177)

## 2011-02-15 LAB — APTT: aPTT: 24 seconds (ref 24–37)

## 2011-02-15 LAB — PROTIME-INR: Prothrombin Time: 12.7 seconds (ref 11.6–15.2)

## 2011-02-15 LAB — TROPONIN I: Troponin I: 0.01 ng/mL (ref 0.00–0.06)

## 2011-02-15 LAB — BRAIN NATRIURETIC PEPTIDE: Pro B Natriuretic peptide (BNP): 43 pg/mL (ref 0.0–100.0)

## 2011-02-18 ENCOUNTER — Emergency Department (HOSPITAL_COMMUNITY)
Admission: EM | Admit: 2011-02-18 | Discharge: 2011-02-18 | Disposition: A | Discharge status: Home / Self Care | Payer: Medicare Other | Attending: Emergency Medicine | Admitting: Emergency Medicine

## 2011-02-18 DIAGNOSIS — R110 Nausea: Secondary | ICD-10-CM | POA: Insufficient documentation

## 2011-02-18 DIAGNOSIS — R634 Abnormal weight loss: Secondary | ICD-10-CM | POA: Insufficient documentation

## 2011-02-18 DIAGNOSIS — I2510 Atherosclerotic heart disease of native coronary artery without angina pectoris: Secondary | ICD-10-CM | POA: Insufficient documentation

## 2011-02-18 DIAGNOSIS — I10 Essential (primary) hypertension: Secondary | ICD-10-CM | POA: Insufficient documentation

## 2011-02-18 DIAGNOSIS — I252 Old myocardial infarction: Secondary | ICD-10-CM | POA: Insufficient documentation

## 2011-02-18 DIAGNOSIS — E7800 Pure hypercholesterolemia, unspecified: Secondary | ICD-10-CM | POA: Insufficient documentation

## 2011-02-18 DIAGNOSIS — R5383 Other fatigue: Secondary | ICD-10-CM | POA: Insufficient documentation

## 2011-02-18 DIAGNOSIS — R197 Diarrhea, unspecified: Secondary | ICD-10-CM | POA: Insufficient documentation

## 2011-02-18 DIAGNOSIS — K5900 Constipation, unspecified: Secondary | ICD-10-CM | POA: Insufficient documentation

## 2011-02-18 DIAGNOSIS — R5381 Other malaise: Secondary | ICD-10-CM | POA: Insufficient documentation

## 2011-02-18 LAB — CBC
HCT: 37.6 % (ref 36.0–46.0)
Hemoglobin: 12.7 g/dL (ref 12.0–15.0)
MCH: 28.5 pg (ref 26.0–34.0)
MCHC: 33.8 g/dL (ref 30.0–36.0)
MCV: 84.5 fL (ref 78.0–100.0)
Platelets: 256 10*3/uL (ref 150–400)
RBC: 4.45 MIL/uL (ref 3.87–5.11)
RDW: 13.2 % (ref 11.5–15.5)
WBC: 6.9 10*3/uL (ref 4.0–10.5)

## 2011-02-18 LAB — COMPREHENSIVE METABOLIC PANEL WITH GFR
ALT: 12 U/L (ref 0–35)
AST: 17 U/L (ref 0–37)
Albumin: 3.7 g/dL (ref 3.5–5.2)
Alkaline Phosphatase: 40 U/L (ref 39–117)
BUN: 9 mg/dL (ref 6–23)
CO2: 24 meq/L (ref 19–32)
Calcium: 8.9 mg/dL (ref 8.4–10.5)
Chloride: 102 meq/L (ref 96–112)
Creatinine, Ser: 0.75 mg/dL (ref 0.4–1.2)
GFR calc non Af Amer: >60 mL/min
Glucose, Bld: 105 mg/dL — ABNORMAL HIGH (ref 70–99)
Potassium: 3.8 meq/L (ref 3.5–5.1)
Sodium: 133 meq/L — ABNORMAL LOW (ref 135–145)
Total Bilirubin: 0.4 mg/dL (ref 0.3–1.2)
Total Protein: 6.4 g/dL (ref 6.0–8.3)

## 2011-02-18 LAB — URINALYSIS, ROUTINE W REFLEX MICROSCOPIC
Glucose, UA: NEGATIVE mg/dL
Ketones, ur: NEGATIVE mg/dL
Leukocytes, UA: NEGATIVE
Nitrite: NEGATIVE
Protein, ur: NEGATIVE mg/dL
pH: 6.5 (ref 5.0–8.0)

## 2011-02-18 LAB — DIFFERENTIAL
Basophils Absolute: 0 10*3/uL (ref 0.0–0.1)
Basophils Relative: 1 % (ref 0–1)
Eosinophils Absolute: 0.1 10*3/uL (ref 0.0–0.7)
Eosinophils Relative: 1 % (ref 0–5)
Lymphocytes Relative: 16 % (ref 12–46)
Lymphs Abs: 1.1 10*3/uL (ref 0.7–4.0)
Monocytes Absolute: 0.5 10*3/uL (ref 0.1–1.0)
Monocytes Relative: 7 % (ref 3–12)
Neutro Abs: 5.2 10*3/uL (ref 1.7–7.7)
Neutrophils Relative %: 75 % (ref 43–77)

## 2011-02-18 LAB — URINE MICROSCOPIC-ADD ON

## 2011-02-18 LAB — PROTIME-INR: Prothrombin Time: 30.5 seconds — ABNORMAL HIGH (ref 11.6–15.2)

## 2011-03-16 NOTE — Assessment & Plan Note (Signed)
[facility]                            CARDIOLOGY OFFICE NOTE   PHOENIX, RIESEN                      MRN:          [id number]  DATE:12/11/2008                            DOB:          Mar 05, 1921    PRIMARY CARDIOLOGIST:  Madolyn Frieze. Jens Som, MD, [facility]   PRIMARY CARE Leor Whyte:  Arturo Morton. Riley Kill, MD, [facility]   HISTORY OF PRESENT ILLNESS:  This is a very pleasant 77 year old white  female patient of Dr. Bonnee Quin who was recently hospitalized for  dyspnea.  She had a CT angio for chest, January 18, showing no evidence  of pulmonary emboli or acute abnormality.  A 4% T9 compression fracture  was found without bony retropulsions.  The patient's metoprolol was  stopped because of a slow heart rate and weakness.  She says that has  gotten a little bit better.   Since the patient has been home from the hospital, she continues to have  dyspnea on exertion.  She says while trying to make dinner last night,  she became short of breath, and she has a tightening that begins in her  back and comes around to the front of her chest.  She also has mild  discomfort in her upper chest and left arm.  If she sits down, it goes  away within 5 minutes.  This started when her sister passed away at the  end of 10/19/23 whom she was very close to.  She has been under a lot of  stress, and since that time, this has progressively worsened.  She  denies any rest pain.  She says this occurs only when she tries to do  something.  She says she cannot make her bed, go for walks, or do any  physical exertion at this time.  Since the metoprolol was stopped, she  says the weakness has gotten a little bit better, but she says it did  help her relax when she took it in the evening, and she misses that  feeling because she is quite anxious over her sister's passing.   CURRENT MEDICATIONS:  1. Nexium 40 mg daily.  2. Zocor 40 mg daily.  3. Ambien 5 mg half to one q.h.s.  4. Plavix  75 mg daily.  5. Aspirin 81 mg daily.  6. Norvasc 5 mg daily.  7. Actonel 150 mg monthly.  8. Ropinirole HCL 0.5 mg daily.  9. Vitamin D 1000 mg daily.  10.Prednisone 3 mg daily.   PHYSICAL EXAMINATION:  GENERAL:  This is a pleasant young-looking 82-  year-old white female in no acute distress.  VITAL SIGNS:  Blood pressure 156/77, pulse 60, weight 140.  NECK:  Without any JVD, HJR, bruit, or thyroid enlargement.  LUNGS:  Decreased breath sounds but clear anterior posterior lateral.  HEART:  Regular rate and rhythm at 60 beats per minute.  Normal S1 and  S2.  No murmur, rub, bruit, thrill, or heave noted.  ABDOMEN:  Soft without organomegaly, masses, lesions, or abnormal  tenderness.  EXTREMITIES:  Trace ankle edema on the right, no edema on the left.  EKG:  Normal sinus rhythm.  No acute change.   IMPRESSION:  1. Dyspnea on exertion associated with chest and left arm pain      worrisome for ischemia.  2. Coronary artery disease status post 3-tandem stents in the proximal      and mid-right coronary artery in March 2002 followed by stent to      the ostium of the right coronary artery and mid-right coronary      artery in April 2004.  3. Prior stents to the left anterior descending artery in 1998.  4. History of pulmonary embolus.  Recent CT negative for pulmonary      embolism.  5. Hypertension.  6. Hyperlipidemia.  7. History of breast cancer.  8. Gastroesophageal reflux disease.  9. History of renal calculi.  10.History of colonic tumor in 1943 with ischemic bowel requiring      abdominal surgery.  11.History of nephrolithiasis status post removal of stone many years      ago.   PLAN:  I had a long discussion with the patient and her son concerning  her symptoms.  I am concerned that she is having ischemia and talked to  her about possible cardiac catheterization versus stress testing.  Given  her age, she is reluctant to proceed with admission to the hospital and   cardiac catheterization.  Because of this, I will start her on Imdur 30  mg daily and schedule her for an adenosine Cardiolite to rule out  ischemia.  I have asked her to call if she had any progression of her  symptoms or symptoms at rest.  If she has any prolonged pain, I have  asked her to go to the emergency room.  We will schedule her to see Dr.  Riley Kill back in followup.      Jacolyn Reedy, PA-C  Electronically Signed      Madolyn Frieze. Jens Som, MD, [facility]  Electronically Signed   ML/MedQ  DD: 12/11/2008  DT: 12/11/2008  Job #: [id number]

## 2011-03-16 NOTE — Op Note (Signed)
NAME:  [name] [name], [name] [name]               ACCOUNT NO.:  [id number]   MEDICAL RECORD NO.:  [id number]          PATIENT TYPE:  AMB   LOCATION:  ENDO                         FACILITY:  [facility]   PHYSICIAN:  Georgiana Spinner, M.D.    DATE OF BIRTH:  [birth year]   DATE OF PROCEDURE:  DATE OF DISCHARGE:                               OPERATIVE REPORT   PROCEDURE:  Colonoscopy.   INDICATIONS:  Colon polyps.   ANESTHESIA:  Fentanyl 125 mcg, Versed 12.5 mg.   PROCEDURE:  With the patient mildly sedated in the left lateral  decubitus position, the Pentax videoscopic colonoscope was inserted in  the rectum and passed under direct vision to the cecum identified by the  ileocecal valve and appendiceal orifice both of which were photographed.  In the cecum was a polyp which was photographed and removed using snare  cautery technique setting of 20/150 blended current and was removed  using a Roth retrieval which we then used to bring the polyp with the  endoscope all the way out. We then reinserted the colonoscope and then  again passed it under direct vision to view the cecum and ascending  colon. From this point, the colonoscope was then slowly withdrawn taking  circumferential views of the colonic mucosa stopping at the hepatic  flexure area where a small polyp was seen, photographed and removed  again using snare cautery technique with the same setting of 20/150  blended current. Two to three folds distal to this was another polyp  fairly large, somewhat flat but with some raised portions. This was  photographed and getting once into the correct positioning I was able to  remove most of the polyp using the snare cautery technique and  suctioning the polyp tissue into the scope for retrieval.  We then used  the ERBE argon photocoagulator to eradicate any residual polyp tissue.  The endoscope was then withdrawn all the way to the rectum which  appeared normal on direct and showed hemorrhoids on retroflexed  view.  The endoscope was straightened and withdrawn.  The patient's vital signs  and pulse oximeter remained stable.  The patient tolerated the procedure  well without apparent complications.   FINDINGS:  Multiple polyps involving the hepatic flexure area, cecum and  just distal to the hepatic flexure in the transverse colon.   PLAN:  Await biopsy reports.  The patient will call me for results and  follow-up with me as an outpatient.           ______________________________  Georgiana Spinner, M.D.     GMO/MEDQ  D:  12/28/2007  T:  12/29/2007  Job:  [id number]

## 2011-03-16 NOTE — Assessment & Plan Note (Signed)
[facility]                            CARDIOLOGY OFFICE NOTE   SANDEE, BERNATH                      MRN:          [id number]  DATE:07/30/2008                            DOB:          [birth year]    Ms. Cleere is in for followup.  She has not been having any exertional  chest discomfort.  She is accompanied by her son.  She does state that  she does get sweaty when she gets at a certain level of activity.  She  denies any chest tightness.  Her last catheterization in 2004 did not  demonstrate significant restenosis.  She has not had any other typical  type of chest tightness that she had prior to all of this, however.  She  has also been on prednisone, and has been in the process of tapering  from the prednisone dosing.   Her current medications include Nexium 40 mg daily, Zocor 40 mg daily,  Ambien 5 mg one-half tablet to one tablet nightly, Plavix 75 mg daily,  metoprolol 25 mg one-half daily, aspirin 81 mg daily, Norvasc 5 mg  daily, prednisone 6 mg daily, Actonel 150 mg per month, ropinirole 0.5  mg daily, and vitamin D.   On physical, she is alert and oriented.  The weight is 146, blood  pressure 128/80, the pulse is 52.  The lung fields are clear.  There is  a minimal systolic ejection murmur.  Extremities reveal trace edema  bilaterally with no preference.   The electrocardiogram demonstrates sinus bradycardia, otherwise  unremarkable.   IMPRESSION:  1. Coronary artery disease status post multiple percutaneous coronary      interventions.  2. Hypercholesterolemia, on lipid-lowering therapy.  3. Severe osteoporosis.  4. History of breast cancer.  5. Advanced age.   RECOMMENDATIONS:  We had a discussion regarding her symptoms.  We all  agreed it would not be appropriate at this point in time to pursue an  invasive strategy.  She has not been having any chest tightness, and she  was able to do most of her daily activities.  She does  not believe that  some of these symptoms are necessarily related to (old age).  If they  were to progress, it would be reasonable to consider re-evaluation, but  I would not be inclined to do this unless there would be a progression  in her symptoms at the present time.  I believe, her son agrees with  this strategy as well, as does the patient.  She will continue to follow  up with Dr. Renne Crigler for her laboratories and general medical problems.     Arturo Morton. Riley Kill, MD, [facility]  Electronically Signed   TDS/MedQ  DD: 07/30/2008  DT: 07/30/2008  Job #: [id number]   cc:   Soyla Murphy. Renne Crigler, M.D.

## 2011-03-16 NOTE — Assessment & Plan Note (Signed)
[facility]                            CARDIOLOGY OFFICE NOTE   [name] [name], [name] [name]                      MRN:          [id number]  DATE:04/24/2008                            DOB:          1921-03-12    [name] is in for followup.  She has continued to have some problems  with her back.  She has T12 vertebral collapse.  She also is on chronic  steroids now.  Her sugars have actually been pretty good, and Dr. Renne Crigler  called her and told her that her laboratory studies looked good.  She  denies any ongoing chest pain.  She does have some burning in the  midepigastric area that radiates up into the throat.  She has stopped  her proton pump inhibitor, which is Nexium, and we have told her that  perhaps the best option would be to have her take Protonix if she needs  something; she is concerned about the cost of this.   Today on examination the blood pressure is 160/80, pulse is 61.  The  lung fields are clear, and the cardiac rhythm is regular.   The EKG is entirely within normal limits.   Overall, the patient is stable.  I plan to see her back in followup in  about 3 to 4 months.  With regard to her current situation, she is  approaching her 87th birthday so a conservative course of management is  appropriate.  She will continue followup with Dr. Renne Crigler.     Arturo Morton. Riley Kill, MD, [facility]  Electronically Signed    TDS/MedQ  DD: 04/24/2008  DT: 04/25/2008  Job #: [id number]   cc:   Soyla Murphy. Renne Crigler, M.D.

## 2011-03-16 NOTE — Assessment & Plan Note (Signed)
[facility]                            CARDIOLOGY OFFICE NOTE   NEETU, CARROZZA                      MRN:          [id number]  DATE:08/24/2007                            DOB:          Sep 29, 1921    The patient is in for follow-up.  In general, she has been stable.  She  has not had significant shortness of breath that has been progressive.  She sometimes feels some hot flashes.  Her blood pressures at home have  been running in the 130-140 range.  She has also had her labs done in  Victoria D. Pharr, M.D.'s office and she has been told everything was  fine.   CURRENT MEDICATIONS:  1. Vitamin D 1.25 mg weekly.  2. Nexium 40 mg daily.  3. Zocor 40 mg daily.  4. Ambien 5 mg 1/2 to 1 q.h.s.  5. Plavix 75 mg daily.  6. Metoprolol 25 mg 1/2 tablet daily.  7. Aspirin 81 mg daily.  8. Norvasc 5 mg daily.  9. Prednisone 6 mg daily.   PHYSICAL EXAMINATION:  VITAL SIGNS:  Today blood pressure 170/84.  Both  she and her son assure me that it runs in the 130-140 range at home.  Pulse is 55.  LUNGS:  Lung fields are clear.  HEART:  There is a minimal systolic ejection murmur.  No significant  carotid bruits are noted.   The electrocardiogram demonstrates sinus bradycardia, otherwise within  normal limits.   IMPRESSION:  1. Coronary artery disease with prior percutaneous coronary      intervention, drug-eluting stents.  2. Treated hypertension.  3. Hypercholesterolemia.  4. History of polymyalgia rheumatica on prednisone therapy.  5. Advanced age.   PLAN:  1. Return to clinic in six months.  2. Continue current medical regimen.     Arturo Morton. Riley Kill, MD, [facility]  Electronically Signed   TDS/MedQ  DD: 08/24/2007  DT: 08/25/2007  Job #: [id number]   cc:   Soyla Murphy. Renne Crigler, M.D.

## 2011-03-16 NOTE — Letter (Signed)
December 01, 2007    Soyla Murphy. Renne Crigler, M.D.  [address]  [place], [place] [address]   RE:  [name], [name]  MRN:  [id number]  /  DOB:  July 18, 1921   Dear Dr. Renne Crigler:   I had the pleasure of seeing this patient, [name] [name], in the office  today in follow-up.  As you know, she has been a little short of breath.  The exact etiology of this has not been clear.  She had a thorough exam  in your office with a oxygen saturation that was normal and a chest x-  ray that was subsequently unremarkable.  She also returns here today and  had an EKG that was entirely normal.  She has had some allergies over  the past few days that have kept her from sleeping at night.  However,  over the last couple of nights, she has done better on therapy that she  prescribed.  She said you had told her that some of this could be  anxiety.  She is not been having any typical chest discomfort.  She has  had a little bit of stomach discomfort from time to time, perhaps a  little bit of discomfort in the back.   On her examination today, blood pressure was 134/66 with a pulse 60.  The lungs were entirely clear.  The cardiac rhythm was regular with a minimal systolic ejection murmur.  Extremities reveal no edema, and there is no obvious evidence of DVT   Her electrocardiogram demonstrates normal sinus rhythm that essentially  was within normal limits.   To summarize, there is no obvious clinical reason for her symptoms.  Nonetheless, given her age, there are a variety of things that  potentially could be operative.  As such, we are going to rule out  anemia, check a D-dimer to exclude pulmonary embolus, and also do a  basic metabolic profile.  She is scheduled to see you back in the office  next Tuesday or Wednesday, and hopefully in the interim, we will have  these tests returned.  I will send you a note when that is the case.  If  she were to continue to have symptoms, perhaps we will need to  digoxin  deeper, but given her age, probably less is better at this point in  time.  I did tell her that we would be happy to continue to follow her  closely until either resolution of symptoms or some obvious etiology  becomes apparent.  Thanks for allowing me to share in her care.    Sincerely,      Arturo Morton. Riley Kill, MD, [facility]  Electronically Signed    TDS/MedQ  DD: 12/01/2007  DT: 12/02/2007  Job #: [id number]

## 2011-03-16 NOTE — Letter (Signed)
December 18, 2007    Soyla Murphy. Renne Crigler, M.D.  [address]  [place], [place] [address]   RE:  [name], [name]  MRN:  [id number]  /  DOB:  23-May-1921   Dear Dr. Renne Crigler:   I had the pleasure of seeing [name] [name] in the office today in follow-  up.  As you know, she presented here recently with a fair amount of  moderate dyspnea.  Her oxygen saturation was normal and her chest x-ray  was relatively unremarkable.  She did, however, have an elevated D-  dimer, and she is a high-risk patient for pulmonary embolus with a prior  cancer, relative immobility, and advanced age.  As a result, of multi-  detector CT was done with CT angiography of the chest.  This revealed no  evidence of acute pulmonary thromboembolism.  There was an interval  development of a T12 compression deformity with 20% loss of height and  minimal retropulsion.  There was also a fluid-filled collection in the  left breast with a spiculated density and they recommended a mammogram,  although we are aware that the mammogram was done last month, and our  office has obviously communicated with yours about these findings.   Since last seen, she is getting along really quite well.  She denies any  chest pain or shortness of breath.   MEDICATIONS:  1. Nexium 40 mg daily.  2. Zocor 40 mg daily.  3. Ambien 5 mg one-half to one nightly.  4. Plavix 75 mg daily.  5. Metoprolol 25 mg one-half daily.  6. Aspirin 81 mg daily.  7. Norvasc 5 mg daily.  8. Prednisone 6 mg daily.  9. Ativan p.r.n. t.i.d.  10.Actonel __________ mg a day.  11.Ropinirole, which has recently been started for restless legs.   PHYSICAL:  The blood pressure is 164/74, the pulse is 66.  The lung fields are clear.  The cardiac rhythm is regular.   In reviewing this with her son, he thinks that the treatment for  restless legs and better sleep at night has substantially improved her  overall situation.  She is walking more now and is clearly  less short of  breath.  We will see her back in follow-up as needed, but I will be  happy to see her at any time.  Thanks for allowing me to share in her  care.    Sincerely,      Arturo Morton. Riley Kill, MD, [facility]  Electronically Signed    TDS/MedQ  DD: 12/18/2007  DT: 12/19/2007  Job #: [id number]

## 2011-03-16 NOTE — Discharge Summary (Signed)
[name] [name], [name] [name]               ACCOUNT NO.:  [id number]   MEDICAL RECORD NO.:  [id number]          PATIENT TYPE:  OBV   LOCATION:  3735                         FACILITY:  [facility]   PHYSICIAN:  Arturo Morton. Riley Kill, MD, FACCDATE OF BIRTH:  03[birth year]   DATE OF ADMISSION:  11/18/2008  DATE OF DISCHARGE:  11/20/2008                               DISCHARGE SUMMARY   PRIMARY CARDIOLOGIST:  Maisie Fus D. Riley Kill, MD, [facility]   PRIMARY CARE Scherry Laverne:  Soyla Murphy. Renne Crigler, MD   DISCHARGE DIAGNOSIS:  Dyspnea.   SECONDARY DIAGNOSES:  1. Coronary artery disease, status post previous percutaneous      intervention to the left anterior descending in 1988 and      subsequently to the right coronary artery in 2004.  2. Hyperlipidemia.  3. Hypertension.  4. Osteoporosis.  5. History of breast cancer.  6. History of pulmonary emboli.  7. Gastroesophageal reflux disease.  8. History of renal calculi.  9. 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  10.History of nephrolithiasis, status post stone removal many years      ago.   ALLERGIES:  PENICILLIN, SULFA, DARVOCET, PHENERGAN WITH CODEINE, and  COZAAR.   PROCEDURES:  CT angio of the chest performed November 18, 2008, showing  no evidence of pulmonary emboli or acute abnormality.  A 4% T9  compression fracture without bony retropulsion noted.   HISTORY OF PRESENT ILLNESS:  An 77 year old Caucasian female with prior  history of CAD who presented to the [facility] ED on November 18, 2008,  with a 2-day history of dyspnea on exertion as well as weakness.  She  also reported mild fleeting chest pain occurring on November 17, 2008.  In the emergency department, cardiac markers were normal and the patient  was admitted for evaluation.   HOSPITAL COURSE:  The patient ruled out for MI.  Given her history of  pulmonary embolism as well as an elevated D-dimer noted this admission  at 1.05, a CT of chest  performed and this showed no evidence of  pulmonary embolism or other acute abnormality.  [name] [name] ambulated on  November 19, 2008, as well as this morning and did have some dyspnea;  however, it was overall improved.  Orthostatics were performed and were  normal.  There is no clear objective evidence of ischemia.  Decision at  this time is to continue medical therapy rather than pursue ischemic  evaluation.  If she has recurrent or progressive symptoms, we will  reconsider ischemic evaluation at that time.  [name] [name] will be  discharged home today in good condition.   DISCHARGE LABORATORY DATA:  Hemoglobin 13.9, hematocrit 42.2, WBC 7.4,  platelets 218.  D-dimer 1.05.  Sodium 141, potassium 4.1, chloride 109,  CO2 of 23, BUN 11, creatinine 0.68, glucose 126, total bilirubin 0.7,  alkaline phosphatase 42.  AST 20, ALT 15, total protein 6.6, albumin  3.6, calcium 9.3, magnesium 2.0.  CK 36, MB 1.1, troponin I 0.01.  Total  cholesterol 174, triglycerides 55, HDL 50, LDL  105.   DISPOSITION:  The patient is being discharged home today in good  condition.   FOLLOWUP PLANS AND APPOINTMENTS:  We will arrange for followup with  Herma [name], physician assistant, on December 11, 2008, at 10:50 a.m.  She is asked to follow with Dr. Renne Crigler as previous scheduled.   DISCHARGE MEDICATIONS:  1. Nexium 40 mg daily.  2. Aspirin 81 mg daily.  3. Plavix 75 mg daily.  4. Ambien 10 mg nightly p.r.n.  5. Amlodipine 10 mg daily.  6. Prednisone 1 mg 3 tablets daily.  7. Actonel 150 mg q. month.  8. Vitamin D 1000 mg daily.  9. Calcium 600 mg daily.  10.Simvastatin 40 mg daily.  11.Ropinirole 0.5 mg nightly.  12.Lorazepam 0.5 mg half tablet p.r.n.  13.Tramadol 50 mg half tablet p.r.n.  14.Allegra 180 mg daily p.r.n.  15.Calcitonin-salmon one squirt in the nostril daily.  16.Nitroglycerin 0.4 mg p.r.n. chest pain.   OUTSTANDING LABORATORY STUDIES:  None.   DURATION OF DISCHARGE/ENCOUNTER:  Sixty  minutes including physician  time.      Nicolasa Ducking, ANP      Arturo Morton. Riley Kill, MD, [facility]  Electronically Signed    CB/MEDQ  D:  11/20/2008  T:  11/21/2008  Job:  [id number]   cc:   Soyla Murphy. Renne Crigler, M.D.

## 2011-03-16 NOTE — Letter (Signed)
April 27, 2007    Soyla Murphy. Renne Crigler, M.D.  [address]  [place], [place] [address]   RE:  ZULY, BELKIN  MRN:  [id number]  /  DOB:  Nov 08, 1920   Dear Dr. Renne Crigler:   I had the pleasure of seeing [name] today in the office in followup.  In generally, she has really been quite stable.  She has not been having  any ongoing chest pain.  She does have some sweating spells, but since I  last saw her she has had the diagnosis of polymyalgia rheumatica and  placed on prednisone.  She says her appetite has increased and in fact  her weight has gone from 137 to 143.   EXAM:  Today the blood pressure was 190/80, pulse was 58.  She says that  her blood pressure was normal at home earlier, but she only got 2 hours  of sleep last night.  LUNGS:  The lung fields are clear.  CARDIAC:  Rhythm was regular.  There were no murmurs.   ELECTROCARDIOGRAM:  Reveals sinus bradycardia.  Otherwise, normal.   I have asked her to follow her blood pressures and let you know about  them.  We will continue to recommend the current medical regimen.  I  will see her back in followup in about 3 to 6 months.  Should she have  any problems in the interim, she is to contact me.  It would be  important for her blood pressure to be under control.  Thanks for  allowing me to share in her care.    Sincerely,      Arturo Morton. Riley Kill, MD, [facility]  Electronically Signed    TDS/MedQ  DD: 04/27/2007  DT: 04/27/2007  Job #: [id number]

## 2011-03-16 NOTE — H&P (Signed)
NAME:  [name], [name]               ACCOUNT NO.:  [id number]   MEDICAL RECORD NO.:  [id number]          PATIENT TYPE:  OBV   LOCATION:  3735                         FACILITY:  [facility]   PHYSICIAN:  Rollene Rotunda, MD, FACCDATE OF BIRTH:  11-12-1920   DATE OF ADMISSION:  11/18/2008  DATE OF DISCHARGE:                              HISTORY & PHYSICAL   PRIMARY CARE PHYSICIAN:  Soyla Murphy. Renne Crigler, M.D.   CARDIOLOGIST:  Arturo Morton. Riley Kill, MD, [facility].   REASON FOR PRESENTATION:  Evaluate the patient with weakness and  shortness of breath.   HISTORY OF PRESENT ILLNESS:  The patient is a pleasant 77 year old white  female with a long history of coronary artery disease.  She has had  multiple percutaneous interventions.  She had been doing relatively well  she said for about the last 5 years.  In fact, that is the last time she  had a cardiac catheterization.  She gets around in her house and does  some light chores.  She lives with her son.  However, over the weekend,  she was noticing increasing dyspnea with mild exertion.  She was not  describing PND or orthopnea.  However, she felt like she was having more  trouble getting a deep breath.  She had some mild fleeting chest  discomfort yesterday, but does not report any of the previous substernal  chest discomfort that she had at the time of her coronary interventions.  She has not had any chest pressure, neck or arm discomfort.  She has not  noticed any palpitations, presyncope, or syncope.  However, she has been  weak.  She says she just felt like she has had weakness in her legs and  less exercise tolerance.  She has noticed some labile blood pressures as  well.  She said that one of her readings was actually systolics 190s.  She has not been having any fevers, chills, or cough.  She has not been  noticing any orthostatic symptoms.  Because of all of these, she was  advised to come to the emergency room.   PAST MEDICAL HISTORY:  Coronary  artery disease (PTCA of the LAD in 1988,  PTCA of the right coronary artery in 2004.  In April 2004, the left main  was normal.  The LAD had proximal and mid vessel stents which were  patent, there was 20% stenosis between stents, 30% in-stent restenosis  distal to the stent and also some 30% stenosis distal to the stents,  there was a small high diagonal which was covered by the proximal stent  and had 90% ostial stenosis, the circumflex had 30% proximal stenosis  and 40% stenosis at the takeoff of an obtuse marginal, the right  coronary artery had patent stents), hyperlipidemia, severe osteoporosis,  history of breast cancer, and pulmonary emboli.   PAST SURGICAL HISTORY:  Colonic tumor resected in 1943 and colon  resection and colostomy with reversal apparently for benign tumors.   ALLERGIES:  PENICILLIN, SULFA, DARVOCET, PHENERGAN, CODEINE, and COZAAR  (this causes dizziness).   MEDICATIONS:  Nexium, Zocor 40 mg daily, Ambien, Plavix  75 mg daily,  metoprolol 12.5 mg daily, aspirin 81 mg daily, Norvasc 5 mg daily,  prednisone, vitamins, ropinirole, and Actonel.   SOCIAL HISTORY:  The patient lives in [place] with her son.  She is  retired.  She does not smoke cigarettes or drink alcohol.   FAMILY HISTORY:  Contributory for mother dying of a myocardial function  at 72.  Father died with a stroke at 15.   REVIEW OF SYSTEMS:  Positive for recent GI upset and probably stomach  virus.  She has also been under stress as her sister died recently.  Otherwise, positive for headaches and occasional dizziness.  Negative  for all other systems.   PHYSICAL EXAMINATION:  GENERAL:  The patient is pleasant and in no  distress.  VITAL SIGNS:  Blood pressure 170/84, heart rate 62 and regular,  respiratory rate 16, afebrile, and 99% saturation on room air.  HEENT:  Eyelids unremarkable, pupils equal, round, and reactive to  light, fundi not visualized, oral mucosa unremarkable.  NECK:  No  jugular distention at 45 degrees, carotid upstroke brisk and  symmetric, no bruits, no thyromegaly.  LYMPHATICS:  No cervical, axillary, or inguinal adenopathy.  LUNGS:  Clear to auscultation bilaterally.  BACK:  No costovertebral angle tenderness.  CHEST:  Unremarkable.  HEART:  PMI not displaced or sustained, S1 and S2 within normal limits,  no S3, no S4, no clicks, no rubs, no murmurs.  ABDOMEN:  Obese, positive bowel sounds normal in frequency and pitch, no  bruits, no rebound, no guarding, no midline pulsatile mass,  hepatomegaly, or splenomegaly.  SKIN:  No rashes.  No nodules.  EXTREMITIES:  2+ pulse throughout, no edema, no cyanosis or clubbing.  NEUROLOGIC:  Oriented to person, place, and time, cranial nerves II-XII  grossly intact.   Chest x-ray pending.   Labs pending.   EKG, sinus bradycardia, rate 52, axis within normal limits, intervals  within normal limits, no acute ST-T wave changes.   ASSESSMENT AND PLAN:  1. Shortness of breath.  The patient's predominant complaint seems to      be shortness of breath.  She does seem a little bit dyspneic with      movement in the bed, but she is certainly in no distress.  At this      point, we will check a chest x-ray and a BNP level.  I will check a      D-dimer.  I have a low threshold for an echocardiogram.  Further      treatment and evaluation will be based on these results.  2. Chest pain.  This is not a primary complaint.  We will check      cardiac enzymes and repeat EKGs.  I will review her films with Dr.      Riley Kill.  However, at this point, I am not suggesting invasive      cardiac evaluation unless other findings are abnormal.  3. Weakness.  We will check a CBC, CMET, TSH, and orthostatic blood      pressures.  I will hold her beta-blocker as she is somewhat      bradycardic.  4. Hypertension.  We will continue the current medications with the      exception of the beta-blocker.  We will have to watch for any       labile blood pressures and make further adjustments to her medical      regimen as needed.  We could go up on  her Norvasc.      Rollene Rotunda, MD, [facility]  Electronically Signed     JH/MEDQ  D:  11/18/2008  T:  11/18/2008  Job:  [id number]   cc:   Soyla Murphy. Renne Crigler, M.D.

## 2011-03-19 NOTE — Cardiovascular Report (Signed)
[place]  Patient:    [name] [name], [name] [name]                      MRN: [id number] Proc. Date: 01/09/01 Adm. Date:  [id number] Attending:  Learta Codding CC:         Arturo Morton. Riley Kill, M.D. [facility]  Cardiopulmonary Laboratory   Cardiac Catheterization  CLINICAL HISTORY:  [name] is 77 years old, has had previous stents placed in the LAD in 1998 and was admitted recently with recurrent chest pain.  She was studied a few days ago by Dr. Riley Kill and intervention of the right coronary artery was planned today.  DESCRIPTION OF PROCEDURE:  The procedure was performed via the left femoral artery using an arterial sheath and 6 [place] JR4 guiding catheter with side holes.  We used a special Scimed wire with length markers to size the stent. The patient was given weight-adjusted heparin to prolong the ACT greater than 200 seconds and was given double bolus Integrilin and infusion.  We used We were able to cross the lesion in the ostium and proximal portion of the right coronary artery with the wire without difficulty.  Judging from the wire markers, we were unable to cover the ostium and other three lesions with one stent.  For this reason, we made a decision to balloon the distal lesion and hopefully stent the proximal three with one stent.  We initially went in with a 3.25 x 20 mm Quantum Ranger and performed three inflations of 10 atmospheres up to 30 seconds and crossed all four lesions.  This balloon was somewhat undersized so we went back in with a 3.75 x 20 mm Quantum Ranger and performed two inflations up to 5 atmospheres for 32 and 48 seconds on the distal (fourth) lesion.  This resulted in a small tear so we elected to stent this lesion with plans to cover the ostium in the other two lesions with one stent. We stented this with a 3.5 x 12 mm NIR deploying this with one inflation of 10 atmospheres for 37 seconds.  We then placed a 3.5 x 31 NIR in the  proximal vessel but found that it would not completely cover the ostium in the third lesion.  We elected to place it distal with the distal edge near the distal stent but not overlapping and we deployed this with two inflations at 12 and 10 atmospheres for 32 and 28 seconds.  We then stented the ostial lesion with a 3.5 x 9 mm NIR stent deploying this with one inflation at 15 atmospheres for 33 seconds.  This just barely overlapped the mid stent and was positioned to what we thought we ideally at the ostium.  We then went back in with a 2.75 Quantum Ranger and dilated within the proximal two stents covering the ostium performing three inflations to 15 atmospheres for 25-30 seconds.  Repeat diagnostic studies were then performed with the guiding catheter.  The patient tolerated the procedure well and left the laboratory in satisfactory condition.  RESULTS: Initially the stenosis in the ostium was 80% and then there were three tandem lesions extending from the proximal to the mid vessel of 80, 70, and 70%.  Following stenting each of these lesions improved to 0%.  This was intentional and there was no significant stenosis between the two stents.  CONCLUSIONS:  Successful placement of three tandem stents in the proximal and mid right coronary artery (mid  and distal stent not overlapping) with improvement in the distal stenosis from 70% to 0%, improvement in the proximal stenoses from 70% to 0% and 80% to 0% and improvement in the ostial stenosis from 80% to 0%.  DISPOSITION:  The patient was returned to the postangioplasty unit for further observation. DD:  01/09/01 TD:  01/10/01 Job: [id number]

## 2011-03-19 NOTE — Assessment & Plan Note (Signed)
[facility]                            CARDIOLOGY OFFICE NOTE   [name], [name]                      MRN:          [id number]  DATE:01/01/2009                            DOB:          09/28/1921    [name] [name] is in for followup.  In general, she has been pretty stable.  Her blood pressure has been up a little bit, but she is overall not had  any chest discomfort.  She was in the hospital in January 2010 and then  subsequently with a negative CT scan.  She had evidence of a compression  fracture, but no pulmonary emboli.  She has seen back by the PA, and she  had a normal myocardial perfusion imaging study at that time.  Ejection  fraction was normal.  She is now on some prednisone daily.   CURRENT MEDICATIONS:  1. Nexium 40 mg daily.  2. Zocor 40 mg daily.  3. Ambien at night.  4. Plavix 75 mg daily.  5. Aspirin 81 mg daily.  6. Norvasc 5 mg daily.  7. Actonel 150 monthly.  8. Ropinirole 0.5 daily.  9. Vitamin D.  10.Prednisone 3 mg daily.  11.Imdur 30 mg daily.  12.Calcium 600 mg daily.   Today the blood pressure is elevated at 194/72, when seen in the office  recently was 156/77, pulse is 60.  The lung fields are clear.  Her  cardiac exam is currently unremarkable.   [name] [name] electrocardiogram seen by Marcelino Duster was unremarkable and as  noted, her myocardial perfusion imaging studies were unremarkable as  well.   IMPRESSION:  1. Coronary artery disease, status post multiple percutaneous coronary      interventions.  2. Advanced age.  3. Hypercholesterolemia.  4. Hypertension, labile.  5. History of osteoporosis.  6. History of breast cancer.   PLAN:  I have asked her son to get more blood pressures for [place], so we  can monitor this.  If they do remain elevated, this might need to be  treated.  Otherwise, we would not change her medical  regimen as it has been variable in the past.  They will notify us if the  blood pressures  remain elevated.     Arturo Morton. Riley Kill, MD, [facility]  Electronically Signed    TDS/MedQ  DD: 01/12/2009  DT: 01/13/2009  Job #: [id number]   cc:   Soyla Murphy. Renne Crigler, M.D.

## 2011-03-19 NOTE — Cardiovascular Report (Signed)
NAME:  [name] [name], FATH                         ACCOUNT NO.:  [id number]   MEDICAL RECORD NO.:  [id number]                   PATIENT TYPE:  INP   LOCATION:  2018                                 FACILITY:  [facility]   PHYSICIAN:  Salvadore Farber, M.D.             DATE OF BIRTH:  [birth year]   DATE OF PROCEDURE:  03/01/2003  DATE OF DISCHARGE:  03/01/2003                              CARDIAC CATHETERIZATION   PROCEDURE:  Coronary angiography.   INDICATIONS FOR PROCEDURE:  The patient is a 77 year old lady status post  stenting of her RCA for in-stent restenosis performed by Dr. Juanda Chance on February 19, 2003.  She re-presented to our office yesterday with recurrent chest  discomfort with trivial exertion and subsequently at rest.  She was  hospitalized and ruled out for myocardial infarction.  She was then referred  for diagnostic angiography.   DIAGNOSTIC TECHNIQUE:  Informed consent was obtained.  Under 1% lidocaine  local anesthesia, a 6-French sheath was placed in the left femoral artery  using the modified Seldinger technique.  Diagnostic angiography was  performed using No-Torque right and JL4 catheters.  The patient tolerated  the procedure well and was transferred to the holding room in stable  condition.  Ventriculography was deferred due to recent ventriculography and  no change in coronary anatomy.   COMPLICATIONS:  None.   FINDINGS:  1. Left main:  Angiographically normal.  2. LAD:  The LAD is a moderate-sized vessel.  There are stents in the     proximal and mid vessel.  The proximal stent is widely patent.  There is     a 20% stenosis of the mid vessel between the 2 stents.  There is     approximately 30% in-stent restenosis in the more distal of the 2 stents.     There is a small high diagonal which is jailed by the more proximal stent     and has a 90% ostial stenosis.  This is unchanged compared to the study     of February 19, 2003.  3. Circumflex:  The circumflex is a  moderate-sized vessel, giving rise to 2     obtuse marginal branches.  There is a 30% stenosis of the proximal     vessel.  There is a 40% stenosis after the takeoff of the first obtuse     marginal.  4. RCA:  The RCA is a large dominant vessel.  The proximal and mid vessel     are lined with stents.  These stents are widely patent with TIMI-3 flow     to the distal vessel.    IMPRESSION/RECOMMENDATIONS:  Widely patent right coronary artery stents.  No  change in coronary anatomy compared with February 19, 2003.  I therefore  suspect noncardiac etiology to her chest pain.  Will plan on continued  medical therapy and further outpatient evaluation of her chest pain should  it recur.                                               Salvadore Farber, M.D.    WED/MEDQ  D:  03/01/2003  T:  03/02/2003  Job:  [id number]   cc:   Soyla Murphy. Renne Crigler, M.D.  [address]  [place]  [place] [address]  Fax: [phone]   Arturo Morton. Riley Kill, M.D.

## 2011-03-19 NOTE — Discharge Summary (Signed)
NAME:  [name] [name], [name] [name]                         ACCOUNT NO.:  [id number]   MEDICAL RECORD NO.:  [id number]                   PATIENT TYPE:  INP   LOCATION:  4731                                 FACILITY:  [facility]   PHYSICIAN:  Olga Millers, M.D. [facility]            DATE OF BIRTH:  04/09/1921   DATE OF ADMISSION:  01/19/2004  DATE OF DISCHARGE:  01/22/2004                                 DISCHARGE SUMMARY   PROCEDURES:  Lower extremity Dopplers.   HOSPITAL COURSE:  [name] [name] is an 77 year old female with a history of  coronary artery disease.  Her last percutaneous intervention was in April of  2004.  She had chest pain and abdominal discomfort which started two days  prior to admission after an increased exertion level.  It was on and off and  relieved with Tylenol, as well as Mylanta or Tums.  There was an epigastric  component as well as a chest pressure component.  She was admitted for  further evaluation and treatment.   Her enzymes were negative for MI, and she was evaluated by Dr. Riley Kill.  There was some concern because heart rate was dropping into the 40s on her  current beta blocker dosage and this was decreased.  Additionally, she said  that she felt like she was having increased epigastric symptoms because of  reflux disease and her proton-pump inhibitor was increased to b.i.d.  She  had a history of PE in May 2004 and had taken Coumadin for six months but is  not currently on Coumadin.  Lower extremity Dopplers were negative for DVT  and a D-dimer was within normal limits as well.   [name] [name] was seen by the mobility team and ambulated without any pain.  She  had an O2 saturation of 99% post ambulation of 400 feet with minimal  assistance.   Dr. Riley Kill evaluated [name] [name] on January 22, 2004, and felt like the most  likely etiology of her pain was GI.  There is a possibility of an anginal  component but hopefully the adjustment in her medication will help treat  this as  well.  Because she was ambulating without chest pain or shortness of  breath and her enzymes were negative for MI and her symptoms had resolved,  she was considered stable for discharge on January 22, 2004, with outpatient  followup arranged.   LABORATORY VALUES:  Hemoglobin 12.8, hematocrit 38.1, WBC 7.7, platelets  248.  Sodium 134, potassium 3.8, chloride 107, CO2 of 23, BUN 12, creatinine  0.9, glucose 108.  Other CMET values within normal limits.  Magnesium level  2.4, calcium 8.9.  D-dimer 0.24.  Total cholesterol 161, triglycerides 100,  HDL 43, LDL 98.  Fecal occult blood was negative.   Chest x-ray:  No focal infiltrate or CHF.  COPD with hyperinflation.  Heart  sounds within normal limits.  No active disease.   DISCHARGE CONDITION:  Improved.   DISCHARGE DIAGNOSES:  1. Chest pain, most likely etiology is gastrointestinal plus or minus     anginal symptoms as well.  2. Allergies to CODEINE, PENICILLIN, SULFA, PHENERGAN, and DARVOCET.  3. Bradycardia with a heart rate in the 40s at times, beta blocker     decreased.  4. Status post percutaneous intervention to left anterior descending in     1998.  5. Status post percutaneous intervention to the right coronary artery in     April 2004.  6. Status post percutaneous coronary intervention to right coronary artery     initially on August 08, 2001.  7. A 90% small diagonal __________ by the left anterior descending stent     seen on catheterization, April 2004.  8. Preserved left ventricular function, with an ejection fraction of 60% by     catheterization, 2004.  9. Hyperlipidemia.  10.      History of deep venous thrombosis to her right lower extremity and     pulmonary embolus in May 2004 with six-month administration of Coumadin.  11.      Status post gastroesophageal reflux disease, status post     esophagogastroduodenoscopy and colonoscopy.  12.      History of colon tumor and ischemic bowel, status post removal.  13.       History of nephrolithiasis.  14.      Dysphagia.   DISCHARGE INSTRUCTIONS:  1. Her activity level is to be as tolerated.  2. She is to stick to a low-fat, soft diet.  3. She is to see Dr. Chestine Spore, call for an appointment.  4. She is to follow up with Dr. Riley Kill and his P.A., appointment on April 8     at 11 a.m.   DISCHARGE MEDICATIONS:  1. Coated aspirin 81 mg every day.  2. Plavix 75 mg every day.  3. Metoprolol 12.5 mg b.i.d.  4. Micardis 80 mg every day.  5. Norvasc 10 mg every day.  6. Zocor 40 mg every day.  7. Nexium 40 mg twice daily.  8. Nitroglycerin p.r.n.  9. Ambien q.h.s. p.r.n.      Theodore Demark, P.A. [facility]                  Olga Millers, M.D. [facility]    RB/MEDQ  D:  01/22/2004  T:  01/23/2004  Job:  [id number]   cc:   Awanda Mink, M.D.   Arturo Morton. Riley Kill, M.D. [facility]

## 2011-03-19 NOTE — Consult Note (Signed)
NAME:  [name] [name], [name] [name]                         ACCOUNT NO.:  [id number]   MEDICAL RECORD NO.:  [id number]                   PATIENT TYPE:  INP   LOCATION:  5506                                 FACILITY:  [facility]   PHYSICIAN:  Veneda Melter, M.D.                   DATE OF BIRTH:  [birth year]   DATE OF CONSULTATION:  03/20/2003  DATE OF DISCHARGE:                                   CONSULTATION   CHIEF COMPLAINT:  Chest pain.   HISTORY:  The patient is a 77 year old white female with a known history of  coronary artery disease who has undergone multiple percutaneous  interventions who is admitted to [facility] with a one week history of  right leg discomfort and swelling.  The patient recently has undergone  percutaneous intervention on February 19, 2003 by Charlies Constable, M.D. with  rotational atherectomy, cutting balloon, and stent placement to the right  coronary artery.  This was for treatment of an instent restenosis.  She has  also had prior stent placements in the LAD.  She has had intermittent  episodes of chest pain prior to and following this intervention.  Relook  angiography on April 30 by Salvadore Farber, M.D. showed patency of the  stents and medical therapy was recommended.  She was discharged without  discomfort approximately two weeks ago.  For the past one week, however, she  has complained of right lower extremity discomfort.  This is described as a  cramping pain.  This subsequently developed into lower extremity edema with  mild erythema.  She has also noted some fatigue and dyspnea since Saturday  prior to admission which was four days ago.  She has had mild chest  discomfort which she attributes more to her dyspnea than to an actual pain.   REVIEW OF SYSTEMS:  Otherwise notable for arthralgias and diffuse myalgia  pain, but no constitutional symptoms.  No syncope or presyncope.  No  orthopnea or paroxysmal nocturnal dyspnea.   PAST MEDICAL HISTORY:  1. Coronary  artery disease status post percutaneous stent placement in the     LAD in 1998, percutaneous intervention with stent placement to the right     coronary artery on August 08, 2001.  She has had rotational atherectomy,     cutting balloon angioplasty, and drug eluding stent placements in the     right coronary artery on February 19, 2003 by Charlies Constable, M.D. with relook     angiography as noted 10 days later.  2. She has hypertension.  3. Dyslipidemia.  4. She has history of renal calculi.  5. History of colon tumor 1943.  6. She has had polypectomy.  7. History of gastroesophageal reflux disease.  8. Diverticulitis.   ALLERGIES:  COZAAR, PENICILLIN, DARVOCET, PHENERGAN, CODEINE.   CURRENT MEDICATIONS:  1. Plavix 75 mg daily.  2. Aspirin 325 daily.  3. Lovenox 60  mg q.12h.  4. Metoprolol 50 mg daily.  5. Coumadin.  6. Zocor 40 mg.  7. Ambien 5 mg q.h.s.  8. Nexium p.r.n.  9. Norvasc 5 mg daily.  10.      Micardis 80 mg daily.  11.      Multivitamin.   SOCIAL HISTORY:  She is widowed.  She lives in [place] with her son.  She  is a retired housewife.  She denies alcohol or tobacco use.   FAMILY HISTORY:  Mother died at the age of 29of myocardial infarction.  Father died age 85 with stroke.  She does have sisters with coronary  disease.   PHYSICAL EXAMINATION:  GENERAL:  She is a well-developed, well-nourished  white female in no acute distress.  VITAL SIGNS:  Temperature 97.7, pulse 50, blood pressure 154/75,  respirations 20, O2 saturation 98% on room air.  HEENT:  Pupils are equal, round, and reactive to light.  Extraocular  movements are intact.  Oropharynx shows no lesions.  NECK:  Supple.  No adenopathy.  HEART:  Regular rate without murmurs.  LUNGS:  Clear to auscultation.  ABDOMEN:  Soft, nontender.  EXTREMITIES:  There is 1+ right lower extremity edema.  Peripheral pulses  are diminished, but palpable.  NEUROLOGIC:  Motor strength is 5/5.  Sensory is intact to  touch.   LABORATORY DATA:  BUN 10, creatinine 0.6.  Hemoglobin 13.0, hematocrit 38.8,  white count 7.6.  Initial CK 41.  INR 0.9.   ASSESSMENT/PLAN:  The patient is a 77 year old female with coronary artery  disease, hypertension who presents with right lower extremity deep venous  thrombosis.  This started approximately one week following her  catheterization and temporal association suggests a correlation.  She has  also had some dyspnea of unclear etiology which is unlike her prior cardiac  pain and is worrisome for pulmonary embolus.  We agree with plans to proceed  to imaging of the pulmonary tree.  A spiral CT has been ordered.  Anticoagulation is also indicated with Coumadin.  Will recommend that the  Plavix be continued for an additional five month time due to the placement  of drug eluding stents.  Will also increase patient's Norvasc for improved  control of her blood pressure.  At this point patient's DVT  appears to be small and may be managed medically.  Should she have extensive  pulmonary embolus or propagation of the thromboses, placement of inferior  vena cava filter may be considered.  Thank you for calling.  We will follow  the patient weekly during her hospitalization.                                               Veneda Melter, M.D.    NG/MEDQ  D:  03/20/2003  T:  03/20/2003  Job:  [id number]   cc:   Soyla Murphy. Renne Crigler, M.D.  [address]  [place]  [place] [address]  Fax: [phone]   Arturo Morton. Riley Kill, M.D.

## 2011-03-19 NOTE — Procedures (Signed)
[place]. [facility]  Patient:    [name] [name], [name] [name]                      MRN: [id number] Proc. Date: 11/08/00 Adm. Date:  [id number] Attending:  Sabino Gasser                           Procedure Report  PROCEDURE:  Colonoscopy with polypectomy.  INDICATIONS:  Colon polyps.  ANESTHESIA:  Demerol 75 mg, Versed 7.5 mg.  DESCRIPTION OF PROCEDURE:  With patient mildly sedated in the left lateral decubitus position, the Olympus videoscopic colonoscope was inserted in the rectum and passed under direct vision to the cecum, identified by the ileocecal valve and appendiceal orifice, both of which were photographed.  In the appendiceal orifice was a polyp seen, photographed, and removed using snare cautery technique with a setting of 20/20 blended current.  It was suctioned into the endoscope on withdrawal.  The endoscope was then withdrawn, taking circumferential views of the remaining colonic mucosa until we reached the rectum, which appeared normal on direct view and showed internal hemorrhoids on retroflex view.  The endoscope was straightened and withdrawn. The patients vital signs and pulse oximetry remained stable.  The patient tolerated the procedure well without apparent complications.  FINDINGS:  Polyp of cecum and internal hemorrhoids, otherwise unremarkable examination.  PLAN:  The patient will call me for results of biopsy and follow up with me as an outpatient. DD:  11/08/00 TD:  11/08/00 Job: [id number]

## 2011-03-19 NOTE — Cardiovascular Report (Signed)
[place]. [facility]  Patient:    [name] [name], [name] [name]                      MRN: [id number] Proc. Date: 01/06/01 Adm. Date:  [id number] Attending:  Learta Codding CC:         Cardiac Catheterization Lab  Warrick Parisian, M.D.   Cardiac Catheterization  INDICATIONS:  Ms. Nickolson is a delightful 77 year old white female who I have followed for some time.  She has had hyperlipidemia.  She has also had known coronary disease with stents placed in the LAD.  She recently presented with dizziness, hypertension, and recurrent chest discomfort.  She was admitted and stabilized with negative enzymes.  The current study was done to assess coronary anatomy.  She had last catheterization done in May of last year.  PROCEDURES: 1. Left heart catheterization. 2. Selective coronary arteriography. 3. Selective left ventriculography.  DESCRIPTION OF PROCEDURE:  The procedure was performed from the right femoral artery using 6-French catheters.  She tolerated the procedure without complication.  She was taken to the holding area in satisfactory condition.  HEMODYNAMIC DATA: 1. Central aortic:  182/87. 2. Left ventricular:  176/9. 3. No aortic LV gradient.  COMPLICATIONS:  None.  ANGIOGRAPHIC DATA:  Left ventriculography reveals well-preserved global systolic function.  No segmental abnormalities contraction identified.  Ejection fraction was calculated at 58%.  1. The left main coronary artery was free of critical disease. 2. The left anterior descending artery coursed to the apex.  There was    evidence of 2 stents on plain fluoroscopy.  In the first stent, there was    about 40% narrowing which appeared to be smooth and not substantially    progressed.  In the second stent, there was perhaps about 50% narrowing at    the distal end of the stent.  The distal vessel demonstrated mild luminal    irregularities but no critical stenoses.  The first diagonal branch had  its    takeoff in the origin of the first stent and had about 90% narrowing.  The    second diagonal had a takeoff just prior to the second stent and had about    75% proximal narrowing. 3. The circumflex proper had about 30% narrowing at its ostium and about a    40-50% mid stenosis.  The large obtuse marginal branch had 40% narrowing in    the AV circumflex which supplied a fairly large second marginal branch, had    about 70% segmental plaquing just beyond the origin of the first marginal    branch. 4. The right coronary artery had a calcified ostium.  There is probably about    an 80% stenosis at the ostium.  This was followed by tandem stenoses of    80%, 70%, and 70% extending down to the mid portion of the mid vessel.  The    distal vessel was widely patent with a posterior descending and    posterolateral system, all of which were free of significant disease.  CONCLUSIONS: 1. Preserved left ventricular function. 2. No evidence of high grade in-stent restenosis at the previous sites of    percutaneous intervention. 3. Moderate disease of the circumflex system. 4. High grade disease of the proximal right coronary artery over a long area.  DISPOSITION:  Given her advanced age, I am not inclined to recommend revascularization surgery.  She is also not that symptomatic.  I am concerned about  the proximal right coronary artery, but percutaneous intervention would require a long area of stenting which would raise the risk of target vessel revascularization.  Based on this phenomenon, my concern about this vessel is as described.  I will talk over with her the various options, and we will decide together what the best approach at this point in time is.  I have reviewed the films with her son in the catheterization suite. DD:  01/06/01 TD:  01/06/01 Job: [id number]

## 2011-03-19 NOTE — Discharge Summary (Signed)
[place]. [facility]  Patient:    [name] [name], [name] [name]                      MRN: [id number] Adm. Date:  [id number] Disc. Date: 05/10/01 Attending:  Junious Silk Dictator:   Tereso Newcomer, P.A. CC:         Soyla Murphy. Renne Crigler, M.D.  [phone]   Discharge Summary  DATE OF BIRTH:  June 29, 1921.  DISCHARGE DIAGNOSES:  1. Sudden left upper extremity pain, mild anterior chest pain, and     shortness of breath etiology unclear.  2. Coronary artery disease.  3. Status post stent to the ostial/proximal RCA x 4 in March of 2002.     a. Residual 70% circumflex stenosis, 90% diagonal 1 stenosis and 75%        diagonal stenosis, ejection fraction 59%.  4. Gastroesophageal reflux disease.  5. Asthmatic bronchitis.  6. History of colon tumor in 1943.  7. Nephrolithiasis.  8. Osteopenia.  9. Hyperlipidemia. 10. Hypertension. 11. History of sinus bradycardia.  HOSPITAL COURSE:  This 77 year old female presented to the emergency room on May 08, 2001, with complaints of severe sudden left upper extremity pain after eating supper on the date of admission.  She had originally been seen by Dr. Riley Kill in the office for follow up and reported occasional left shoulder discomfort with exertion but no chest pain.  The patient took four sublingual nitroglycerin with some improvement.  She denied nausea, vomiting or diaphoresis.  She awoke around 3 to 4 in the morning, with persistent left arm pain.  She was seen by her primary care physician and was given nitroglycerin spray and aspirin.  PHYSICAL EXAMINATION:  In the emergency room:  VITAL SIGNS: Blood pressure was 167/84, pulse in the 50s, temperature 98.4, oxygen saturation 99% on room air.  NECK:  Without bruits.  HEART:  Regular rate and rhythm, normal S1, S2.  No murmurs.  LUNGS:  Clear to auscultation bilaterally.  ABDOMEN: Soft, nontender.  EXTREMITIES:  Without edema.  EKG:  Revealed sinus  bradycardia with heart rate 53, normal axis, no acute changes.  The patient was admitted and placed on heparin and IV nitroglycerin. It was felt her left upper extremity pain may be an anginal equivalent.  She subsequently ruled out for MI by enzymes.  She went for adenosine Cardiolite on May 09, 2001.  This was negative for ischemia with normal left ventricular wall motion and an EF of 78%.  Dr. Riley Kill saw the patient and ordered an C-spine x-ray as the patient continued to complain of left shoulder pain.  This revealed normal alignment and no acute abnormality.  She denied any further chest pain and on May 10, 2001, the patient was felt to be stable enough for discharge to home.  Upon admission Altace 2.5 mg q. day was added for better blood pressure control and a history of CAD.  Also she was placed on empiric protonix and this was continued at discharge.  FOLLOWUP:  1. She has been asked to followup with her primary care physician in the next     1-2 weeks.  2. She will come by our office next week for a BMP due to recent initiation     of Altace.  3. She will see Dr. Riley Kill on June 07, 2001, at 10:15 a.m.  LABORATORY DATA:  White blood cell count 5,100, hemoglobin 14.3, hematocrit 43.4, platelet count 236,000.  INR 1.0, sodium  138, potassium 4.1, chloride 103, CO2 23, glucose 98, BUN is 9, creatinine 0.7, total bilirubin 0.9, alk. phos. 56, SGOT 25, SGPT 22, total protein 6.6, albumin 3.7, calcium 9.4, cardiac enzymes negative x 3, total cholesterol 153, triglycerides 82, HDL 55, LDL 82.  DISCHARGE MEDICATIONS:  1. Altace 2.5 mg q.d.  2. Protonix 40 mg q.d.  3. Imdur 30 mg q.d.  4. Coated aspirin 325 mg q.d.  5. Zocor 40 mg q.h.s.  6. Plavix 75 mg q.d.  7. Lopressor 25 mg q.d.  8. Nitroglycerin p.r.n. chest pain.  ACTIVITY:  As tolerated.  DIET:  Low fat, low salt diet.  FOLLOWUP:  The patient as noted above is to come to our office next week for a BMP.  She is to  followup with Dr. Renne Crigler in 1-2 weeks and is to call for an appointment.  She is to see Dr. Riley Kill on June 07, 2001, at 10:15 a.m. DD:  05/10/01 TD:  05/10/01 Job: [id number]

## 2011-03-19 NOTE — H&P (Signed)
NAME:  [name] [name], [name] [name]                         ACCOUNT NO.:  [id number]   MEDICAL RECORD NO.:  [id number]                   PATIENT TYPE:  INP   LOCATION:  6531                                 FACILITY:  [facility]   PHYSICIAN:  Salvadore Farber, M.D.             DATE OF BIRTH:  Aug 29, 1921   DATE OF ADMISSION:  02/28/2003  DATE OF DISCHARGE:                                HISTORY & PHYSICAL   CHIEF COMPLAINT:  Chest pain.   HISTORY OF PRESENT ILLNESS:  The patient is a pleasant 77 year old female  followed in our office by Dr. Dietrich Pates and private patient of Dr. Merri Brunette.  She recently underwent cardiac catheterization on February 19, 2003,  performed by Dr. Charlies Constable, at which time she had two stents placed in  her right coronary artery secondary to in-stent restenosis.  Her ejection  fraction was 50% at that time.  She previously had right coronary artery  stenting x3 performed in 2002.  She also had proximal and mid distal LAD  stents in 1998.   The patient presents to the office today for evaluation of chest pain.  She  states that she developed chest pain yesterday while doing some activities  around the house.  The pain was relieved by rest and came on again with  activity.  It was very similar to her pain that she experienced prior to her  stent.  The pain has been associated with some diaphoretic spells and some  mild shortness of breath, but no nausea or vomiting.  She took one  nitroglycerin earlier today, which provided partial relief.  When in the  office today she still had 8/10 chest pain.  This was at approximately 4  o'clock.  The pain had come on today at around noon.  She had seen Dr. Renne Crigler  in the office today at around 11:30 a.m., at which time an EKG showed some  nonspecific ST changes.  A repeat EKG performed in our office was  essentially normal.  Following discussion with Dr. Samule Ohm a decision was  made to admit the patient to [facility] for  further evaluation of  her symptoms.   PAST MEDICAL HISTORY:  Please see cardiac history as noted above.  1. The patient has a history of elevated lipids.  2. History of hypertension.  3. History of gastroesophageal reflux disease.  4. She has renal calculi.  5. She has preserved LV function.  6. She has had multiple stents in the past.  7. She had anemia during this last admission.  8. She also had a right groin bruit post procedure.  An ultrasound was     negative for pseudoaneurysm or fistula.   ALLERGIES:  1. CODEINE.  2. PHENERGAN.  3. PENICILLIN.  4. DARVOCET.   CURRENT MEDICATIONS:  1. Aspirin 325 mg daily.  2. Zocor 40 mg daily.  3. Plavix 75 mg  daily.  4. Lopressor 25 mg b.i.d.  5. Norvasc 5 mg daily.  6. Micardis 80 mg daily.  7. Nitroglycerin p.r.n.  8. Ambien at bedtime p.r.n.   SOCIAL HISTORY:  The patient lives in [place] with one of her sons.  She  has three children.  She does not use alcohol or tobacco.   FAMILY HISTORY:  Her mother died at age 57 from an MI.  Her father died at  age 84 from a CVA.  She had nine brothers and sisters.  One sister has had  CABG.  One brother has had CABG.   REVIEW OF SYSTEMS:  Essentially negative except for as noted above, as well  as some mild arthritis and mild hearing loss.   PHYSICAL EXAMINATION:  GENERAL:  Reveals a pleasant 77 year old white female  in no acute distress.  VITAL SIGNS:  Blood pressure 142/74, pulse 65, weight approximately 140  pounds.  HEENT:  Unremarkable.  NECK:  Reveals no bruits, no jugular venous distention.  HEART:  Reveals regular rate and rhythm without murmur.  LUNGS:  Clear.  ABDOMEN:  Soft, slightly obese, nontender.  EXTREMITIES:  Reveal pulses intact with no edema.   LABORATORY DATA:  An EKG in the office today shows normal sinus rhythm, rate  65 beats per minute.  An EKG in Dr. Carolee Rota office earlier today showed  normal sinus rhythm, rate 52 beats per minute with slight ST  changes,  inferior as well as laterally.   IMPRESSION:  1. Recurrent chest pain.  2. History of coronary artery disease with recent stents x2 to the right     coronary artery performed February 19, 2003.  3. Preserved left ventricular function, ejection fraction approximately 60%.  4. Previous stents to the right coronary artery performed in 2002.  5. Previous stents to the left anterior descending performed in 1998.  6. History of elevated lipids.  7. History of hypertension.  8. Gastroesophageal reflux disease.  9. History of renal calculi.  10.      History of bradycardia.  11.      History of mild anemia.  12.      Right groin bruit without left groin bruit, no pseudoaneursym or     fistula by ultrasound during her last admission.  13.      History of osteoporosis.  14.      Gastroesophageal reflux disease.    PLAN:  The patient will be admitted to [facility] at this time.  She will be started on IV nitroglycerin and IV Lovenox to try to relieve her  pain.  She will no doubt have to undergo repeat cardiac catheterization to  evaluate for in-stent restenosis.        Delton See, P.A. [facility]                  Salvadore Farber, M.D.    DR/MEDQ  D:  02/28/2003  T:  02/28/2003  Job:  [id number]

## 2011-03-19 NOTE — H&P (Signed)
NAME:  [name] [name], [name] [name]                         ACCOUNT NO.:  [id number]   MEDICAL RECORD NO.:  [id number]                   PATIENT TYPE:  INP   LOCATION:  2019                                 FACILITY:  [facility]   PHYSICIAN:  Veneda Melter, M.D.                   DATE OF BIRTH:  1921-06-28   DATE OF ADMISSION:  03/27/2004  DATE OF DISCHARGE:                                HISTORY & PHYSICAL   Ms. Loud is an 77 year old white female with a known history of coronary  artery disease who presents with midsternal and upper epigastric discomfort  starting today.  The patient did not feel very well yesterday but denied any  discomfort, today has had progressive discomfort in the midsternum with some  radiation around the ribs to the back as well as in the upper mid  epigastrium.  She denies any significant shortness of breath.  She did note  some mild sensation of feeling jittery and has not had any palpitations.  She denies any fevers and complains of subjective chills.  No orthopnea,  paroxysmal nocturnal dyspnea.  She has intermittent lower extremity edema.  No change recently.  Review of symptoms is otherwise noncontributory.   PAST MEDICAL HISTORY:  Known for coronary artery disease, status post  percutaneous intervention of the LAD in 1988 and intervention of the right  coronary artery in April of 2004.  She has a history of DVT and pulmonary  embolus in May of 2005.  She uses Coumadin x6 months at the time she  presented with chest discomfort and shortness of breath.  She has had an EGD  and colonoscopy in January of 2005 with a history of benign polyps.  She had  a history of colon tumor resected in 1943 as well as an ischemic bowel.  She  recently presented to [facility] in March of 2005 with chest discomfort  and at that time cardiac markers were negative and her discomfort was  thought to be GI in source.  She has a history of well preserved LV  function.   ALLERGIES:  1.  PENICILLIN which causes a rash.  2. DARVOCET which causes jitteriness.  3. PHENERGAN causes nausea and vomiting.  4. CODEINE causes a rash.  5. SULFA.  6. An intolerance to COZAAR which caused dizziness.   CURRENT MEDICATIONS:  1. Plavix 75 mg per day.  2. Metoprolol 12.5 mg b.i.d.  3. Aspirin 325 q. day.  4. Micardis 80 mg per day.  5. Norvasc 10 mg q. day.  6. Tylenol as needed.  7. Ambien 5 mg q. day.  8. Nexium 20 mg per day.  9. Simvastatin 40 mg q.h.s.   SOCIAL HISTORY:  The patient lives in [place].  She is a widow.  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   FAMILY HISTORY:  Mother died at the  age of 59of a myocardial infarction.  Father died when he was 64 of a stroke.  She has a sister with coronary  disease.   PHYSICAL EXAMINATION:  GENERAL: A well-developed and well-nourished white  female, in no acute distress.  VITAL SIGNS: Temperature 97.7, pulse 67, respirations 18, blood pressure  116/78.  HEENT: Pupils are equally round and reactive to light.  Extraocular muscles  are intact.  Oropharynx showed no lesions.  NECK: Supple with no adenopathy and no bruits.  HEART EXAM: Reveals a regular rate without murmurs.  LUNGS: Showed dry crackles at the base.  ABDOMEN: Soft and subjectively tender in the mid epigastrium.  EXTREMITIES: No significant edema.  Peripheral pulses are 1+ and equal  bilaterally.  MOTOR: Strength is 5/5.  Sensory is intact to touch.   Chest x-ray shows no acute infiltrates or effusions.  ECG is normal sinus  rhythm at 68 with no ST-T wave changes.   LABORATORY DATA:  White count is 7.2, hemoglobin 12.7, hematocrit 38.1,  platelets 243, sodium is 134, potassium 3.9, chloride 102, bicarb 24, BUN is  9, creatinine 0.8, glucose is 103, troponin-I is less than 0.05, INR is 0.9,  D-dimer is 0.56.   ASSESSMENT/PLAN:  Ms. Terris is an 77 year old female with a known history of  coronary artery disease,  pulmonary embolus and dyspepsia who presents with  midsternal upper epigastric discomfort.  The patient feels that this is  similar to a prior cardiac pain.  Given two presentations for chest  discomfort within the past two months, I believe it would be prudent to  admit to the hospital for medical stabilization and to rule out an acute  myocardial infarction with serial enzymes and EKGs.  It will be prudent to  proceed with cardiac catheterization to reassess her coronary arteries  should she have progression of the disease.  Will plan to control her  hemodynamics by advancing her beta blocker and other antihypertensives as  tolerated.  We will go ahead and start her on an IV nitroglycerin drip.  I  will plan on obtaining a CT scan of the chest to rule out aortic dissection  given her complaints of radiation of pain to the back, though this appears  to be unlikely.  The plan was discussed with the patient in detail who  understands and wishes to proceed.  The risks, benefits and alternatives of  cardiac catheterization were discussed with the patient.                                                Veneda Melter, M.D.    Melton Alar  D:  03/27/2004  T:  03/29/2004  Job:  [id number]   cc:   Arturo Morton. Riley Kill, M.D. [facility]   Zollie Beckers D. Renne Crigler, M.D.  [address]  [place]  [place] [address]  Fax: [phone]

## 2011-03-19 NOTE — Op Note (Signed)
NAME:  NAVEEN, LORUSSO               ACCOUNT NO.:  [id number]   MEDICAL RECORD NO.:  [id number]          PATIENT TYPE:  OIB   LOCATION:  5729                         FACILITY:  [facility]   PHYSICIAN:  Sandria Bales. Ezzard Standing, M.D.  DATE OF BIRTH:  05/02/1921   DATE OF PROCEDURE:  02/15/2005  DATE OF DISCHARGE:                                 OPERATIVE REPORT   PREOPERATIVE DIAGNOSIS:  Left breast carcinoma at the 12 o'clock position.   POSTOPERATIVE DIAGNOSIS:  Left breast carcinoma at the 12 o'clock position  with negative left axillary sentinel lymph node.   PROCEDURE:  Needle localization left partial mastectomy (lumpectomy) with  left sentinel lymph node excision, injection of Isosulfan Blue.   SURGEON:  Sandria Bales. Ezzard Standing, M.D.   ANESTHESIA:  General endotracheal anesthesia.   ESTIMATED BLOOD LOSS:  Minimal.   INDICATIONS FOR PROCEDURE:  Ms. Brierley is an 77 year old white female who is  a patient of Dr. Merri Brunette and Dr. Bonnee Quin who has been found to have  a carcinoma of her left breast at about the 12 o'clock position.  This tumor  measures approximately 7 cm on ultrasound and mammography.  Core biopsies  revealed an invasive carcinoma which was ER 3%, PR 3%, with final pending.  A discussion was carried out with the patient about the options for  treatment which include mastectomy versus lumpectomy and axillary node  dissection.  She has elected to proceed with lumpectomy and will plan a  sentinel lymph node on her.  She has seen Dr. Riley Kill who has cleared her  for surgery.  She had a Doppler of her lower extremities which was reported  as negative for a history of DVT.  The indications and potential  complications of the procedure were explained to the patient.   DESCRIPTION OF PROCEDURE:  The patient had the radioactive sulfa colloid  injected about two hours before the procedure.  She presented to the  operating room where she underwent a general endotracheal anesthetic.   Her  left breast was infiltrated with a little less than 1 mL of isosulfan blue  in the periareolar space.  I marked the left axilla where I thought the  counts were high from a probable positive lymph node.  Her left breast and  axilla were prepped with Betadine solution and sterilely draped.  I draped  the Neoprobe and then identified the lymph node in the left axilla.   I made an incsion in the left axilla at the posterior margin of the  pectoralis major muscle.  I identified the sentinel lymph node with counts  of 590.  It was also blue, so it was a hot, blue lymph node.  The background  was about 10-15.  Reports of this by Dr. Berneta Levins was negative for  malignant cells.  I stopped my axillary dissection at this point.   I then turned my attention to the left breast cancer.  She had a needle wire  coming out of her left breast which had [facility].  This wire came out at  about the 2 o'clock position and was aimed towards the  12 o'clock position.  I took a core of breast tissue about 3-4 cm in  diameter down close to the chest wall.  I sent this for specimen mammogram  which confirmed the tumor had been resected.  I did mark the specimen  cranial, medial, caudal and lateral with markers and skin marked the  anterior surface.  The wound was then irrigated.  The subcutaneous tissues  were closed with interrupted 3-0 Vicryl sutures.  The skin was closed with 5-  0 Vicryl sutures.   The wound was painted with tincture of Benzoin and Steri-Strips applied.  The patient tolerated the procedure well.  Final pathology is pending at the  time of this dictation.      DHN/MEDQ  D:  02/15/2005  T:  02/15/2005  Job:  [id number]   cc:   Soyla Murphy. Renne Crigler, M.D.  [address]  [place]  [place] [address]  Fax: [phone]   Arturo Morton. Riley Kill, M.D. [facility]

## 2011-03-19 NOTE — Cardiovascular Report (Signed)
NAME:  [name] [name], [name] [name]                         ACCOUNT NO.:  [id number]   MEDICAL RECORD NO.:  [id number]                   PATIENT TYPE:  INP   LOCATION:  2024                                 FACILITY:  [facility]   PHYSICIAN:  Charlies Constable, M.D.                  DATE OF BIRTH:  16-Aug-1921   DATE OF PROCEDURE:  02/19/2003  DATE OF DISCHARGE:                              CARDIAC CATHETERIZATION   PROCEDURE:  Cardiac catheterization and percutaneous coronary intervention.   INDICATIONS:  The patient is 77 years old and has had multiple prior  percutaneous interventions, including stents in the ostium and mid left  anterior descending artery, and 4 overlapping stents from the ostium to the  mid right coronary artery.  Her last intervention was cutting balloon  angioplasty and stenting of the ostium of the right coronary artery on  August 08, 2001, with a ZETA stent by Dr. Arturo Morton. Stuckey.  She had an  episode of severe chest and back pain on Easter, and since that time has  felt weak, and has had dyspnea with exertion.  She was seen by Dr. Renne Crigler and  sent to the hospital for admission and further evaluation.   PROCEDURE:  The procedure was performed via the right femoral artery using  an arterial sheath and 6-French preformed coronary catheters.  A front wall  arterial puncture was performed and Omnipaque contrast was used.  After  completion of the diagnostic study, we made the decision to proceed with  intervention on the functionally and totally occluded right coronary artery.   The patient was given Angiomax bolus and infusion, and had already been on  Plavix.  We used a 7-French JR4 guiding catheter with side holes.  We were  able to cross the lesion with a Rota Floppy wire without difficulty.  We  went in first with a 1.5 burr and performed 3 runs at approximately 160,000  RPMs for 10 seconds each.  We then upgraded to a 1.75 burr and performed 3  runs at approximately 160,000  RPMs for 10 seconds each.  We then used a 2.5  x 10-mm cutting balloon and dilated the lesion in the mid vessel which was  within the stents with 2 inflations up to 10 atmospheres for 30 seconds.  We  then dilated the lesion in the ostium of the right coronary artery within  the stent with 3 inflations up to 10 atmospheres for 30 seconds.  We then  deployed a 2.5 x 20-mm Taxus stent in the lesion in the mid vessel which was  within the stent.  The new stent extended out beyond the last stent.  We  deployed this with 1 inflation of 14 atmospheres for 35 seconds.  We then  deployed a 2.5 x 18-mm CYPHER stent, positioning the proximal edge just at  the ostium.  We deployed this with 2 inflations up to  18 atmospheres for 30  seconds.  We the post dilated with a 2.75 x 15-mm Quantum Maverick with the  balloon partially outside the ostium and we inflated this to 18 atmospheres  for 30 seconds.  Repeat diagnostic studies were then performed with the  guiding catheter.  The patient tolerated the procedure well and left the  laboratory in satisfactory condition.   RESULTS:  1. The left main coronary artery:  The left main coronary artery was free of     significant disease.  2. Left anterior descending:  The left anterior descending artery gave rise     to a septal perforator and 3 diagonal branches.  There was zero percent     stenosis at the stent in the ostium and zero percent stenosis at the     stent in the mid vessel.  3. Circumflex artery:  The circumflex artery gave rise to a marginal branch     and a posterolateral branch.  This vessel was free of significant     disease.  4. Right coronary artery:  The right coronary is a moderately large vessel     that had a 99% stenosis at the ostium, and there was TIMI-1 flow     distally.  There was also an 80% narrowing in the distal portion of the 4     overlapping stents.  There were some collaterals to the posterior     stenting and  posterolateral branch from the left coronary system.   LEFT VENTRICULOGRAM:  The left ventriculogram was performed in the RAO  projection and showed good wall motion with no area of hypokinesis.  The  estimated ejection fraction was 60%.   INTERVENTION:  Following rotational atherectomy, cutting balloon  angioplasty, and stenting of the ostial lesion, this improved from 99% to  20%, and the flow improved from TIMI-1 to TIMI-3 flow.  Despite very high  pressures with a noncompliant balloon, despite the use of rotational  atherectomy and cutting balloon angioplasty, it was difficult to expand this  area further due to 2 overlapping stents and residual scar tissue within the  stents.  The lesion in the mid vessel improved from 80% to 10% with cutting  balloon angioplasty and stenting.   PRESSURES:  The aortic pressure was 178/76, with a mean of 116.  Left  ventricular pressure was 178/19.   CONCLUSIONS:  1. Coronary artery disease, status post previous percutaneous coronary     interventions as described above with zero percent stenosis at the stent     sites in the ostium and mid left anterior descending artery, no     significant obstruction in the circumflex artery, 99% in-stent restenosis     at the ostium of the right coronary artery with TIMI-1 flow, and 80% in-     stent restenosis in the mid right coronary artery, with normal left     ventricular function.  2. Successful rotational atherectomy, cutting balloon angioplasty, and     stenting of the lesion in the ostium of the in-stent restenosis in the     ostium of the right coronary artery with improvement in stented narrowing     from 99% to 20% using a CYPHER stent.  3. Successful cutting balloon angioplasty and stenting for in-stent     restenosis for the lesion in the mid right coronary artery with     improvement in stented narrowing from 80% to 10% with a Taxus stent.  DISPOSITION:  The patient  remains for further  observation.                                               Charlies Constable, M.D.    BB/MEDQ  D:  02/19/2003  T:  02/20/2003  Job:  [id number]   cc:   Soyla Murphy. Renne Crigler, M.D.  [address]  [place]  [place] [address]  Fax: [phone]   Cardiopulmonary Lab

## 2011-03-19 NOTE — Assessment & Plan Note (Signed)
[facility]                            CARDIOLOGY OFFICE NOTE   [name], [name]                      MRN:          [id number]  DATE:09/30/2006                            DOB:          07/20/1921    [name] [name] is in for a followup visit.  She is stable.  She has not been  having any chest pain.  Her back has been bothering her a lot with pain  radiating down into the groin.  She has been on doses of steroids for  the past few weeks.  She does not really like taking these.  She denies  chest pain.   EXAMINATION:  The blood pressure is 162/78.  The pulse is 58.  LUNGS:  Fields are clear.  The jugular veins are not distended.  The PMI is not displaced.  There  is a normal 1st and 2nd heart sound without murmur, rub or gallop.  There is no extremity edema noted.   Electrocardiogram demonstrates sinus bradycardia, otherwise within  normal limits.   IMPRESSION:  1. Coronary artery disease status post multivessel percutaneous      coronary intervention.  2. Hypercholesterolemia, on lipid lowering therapy.  3. Hypertension.  4. Advanced age.   PLAN:  1. Return to clinic in 6 months.  2. Continue current medical regimen.   Of note, we examined the patient's groin as well because of the location  of the symptoms, and the pulse was fine.  There was no bruit noted.     Arturo Morton. Riley Kill, MD, [facility]  Electronically Signed    TDS/MedQ  DD: 09/30/2006  DT: 10/01/2006  Job #: [id number]

## 2011-03-19 NOTE — H&P (Signed)
NAME:  [name] [name], [name] [name]                         ACCOUNT NO.:  [id number]   MEDICAL RECORD NO.:  [id number]                   PATIENT TYPE:  OUT   LOCATION:  CARD                                 FACILITY:  [facility]   PHYSICIAN:  Soyla Murphy. Renne Crigler, M.D.               DATE OF BIRTH:  05/21/1921   DATE OF ADMISSION:  03/20/2003  DATE OF DISCHARGE:                                HISTORY & PHYSICAL   CHIEF COMPLAINT:  Blood clot.   HISTORY OF PRESENT ILLNESS:  This 77 year old female patient presented to  the office yesterday to see Dr. Renne Crigler complaining of some pain in her right  lower leg with swelling.  She was sent for a Doppler ultrasound.  The venous  Doppler today was completed at [facility] and showed evidence of a  DVT in the right leg, visualized extending through the popliteal and femoral  veins.  Left lower extremity was normal.  She is here for treatment of this  DVT.  The patient was just recently in the hospital, twice in April until  the care of her cardiologist, Arturo Morton. Riley Kill, M.D.  Initially on April  19, she was admitted for chest pain.  She had PTCA and numeral 2 stents  replaced in her right coronary artery this date.  She was then readmitted on  April 29 and had another catheterization on April 30 for continued chest  pain. Catheterization was normal at that time and she was discharged home.  She reports that she has had some right lower leg pain every since her first  discharge on April 21 from the hospital. This pain has intensified during  that last week and is localized to the right medial thigh, groin, and behind  the right medial knee.  It is aggravated with weightbearing. She also  continues to have primarily exertional episodes of chest heaviness, left-  sided chest discomfort with dyspnea and sweating.  She has no personal  history of a DVT.  Her brother died of a sudden DVT with pulmonary embolism  and her father possibly had a DVT and embolus as  well.  She has a sister who  is on Coumadin for other reasons.   REVIEW OF SYSTEMS:  The patient denies fever, chills, nausea, vomiting,  diarrhea, abdominal pain, hematuria, melena, hematochezia, dysuria, rash, or  unusual joint pains.  She has had some pain at the site of the previous  incision in her abdomen.  She occasionally has some left flank pain  radiating down her lower back into her legs. She has no history of recent  peptic ulcer disease, GI bleed, or stroke.  Review of systems is otherwise  negative.   CURRENT MEDICATIONS:  1. Metoprolol 50 mg daily.  2. Aspirin 325 mg daily.  3. Plavix 75 mg daily.  4. Zocor 40 mg daily.  5. Ambien 5 mg q.h.s.  6. Tylenol as needed.  7. Multivitamin daily.  8. Nexium 40 mg p.r.n.  9. Allegra 180 mg daily.  10.      Norvasc 5 mg daily.  11.      Micardis 80 mg daily.   ALLERGIES:  PENICILLIN causes rash, CODEINE causes rash, PHENERGAN causes  nausea and vomiting, COZAAR causes dizziness, ASPIRIN causes tingling but  she is currently taking this at the present time.   PAST MEDICAL HISTORY:  Recurrent coronary artery disease, hypertension,  hyperlipidemia, osteoarthritis, diverticulosis, bronchitis, GERD, history of  bradycardia, history of mild anemia, osteoporosis.   PAST SURGICAL HISTORY:  Colon resection and colostomy with reversal for a  benign tumor in the 1940's.  2002, three stents in the right coronary  artery.  April of 2004, numeral 2 stents in the right coronary artery for  stent restenosis.  Previous stenting of the left anterior descending artery  in 1998.   CONSULTATIONS:  Arturo Morton. Riley Kill, M.D., cardiology.   SOCIAL HISTORY:  The patient is widowed and lives with one of her sons. She  has three children living.  Nonsmoker.  No use of alcohol.   FAMILY HISTORY:  Mother died at 6 from a myocardial infarction.  Father  died at 61 from a stroke, possible history of PE. She has nine brothers and  sisters, one  sister with bypass and a brother with bypass, also has a  brother who died abruptly of a pulmonary embolism.   PHYSICAL EXAMINATION:  VITAL SIGNS:  Age 77, weight 137.5 pounds, blood  pressure 136/88, temperature 97.4, pulse 80 and regular, oxygen is 98% on  room air.  GENERAL:  Pleasant, alert, elderly female in no acute distress accompanied  by her daughter.  HEENT:  Oropharynx is unremarkable with good dentition, no dentures present  or other appliances.  Uvula is midline.  PERRLA.  Extraocular movements  intact.  Hearing grossly intact.  NECK:  Supple without lymphadenopathy, thyromegaly, or mass.  CHEST:  Clear to auscultation bilaterally. No wheezes, rales, rhonchi, or  crackles.  HEART:  Regular rate and rhythm without murmurs, rubs, or gallops.  ABDOMEN:  Mildly obese and soft, nondistended and nontender without  organomegaly, mass, or rebound. She has a central scar with some  contractures in the central abdomen.  RECTAL:  Normal rectal tone with soft, hemoccult negative stool.  EXTREMITIES:  She has superficial varicosities noted in both lower  extremities.  Distal pulses 2+ bilaterally.  She has mild puffiness of the  right lower extremity with focal tenderness extending from the right medial  popliteal fossa up the course of the right medial groin. No evidence of  superficial phlebitis, erythema, or rash. Homan's sign is negative.  GENITOURINARY:  Deferred as not pertinent to present illness.  NEUROLOGY:  Gait is grossly stable and she is alert and oriented to date,  place, and time with normal and appropriate affect.   ASSESSMENT:  1. Right lower extremity popliteal and femoral deep venous thrombosis -     suspicious for pulmonary embolism with recent chest pain and dyspnea.  2. Coronary artery disease with recent restenting of two previous stenotic     stents in the right coronary artery with catheterization on April 30     being normal.  3. Hypertension. 4.  Hyperlipidemia.  5. Osteoporosis.  6. Gastroesophageal reflux disease.   PLAN:  The patient will be admitted to [facility] to Dr.  Renne Crigler.  Obtain a stat spiral chest CT scan with contrast and initiate  Lovenox per pharmacy protocol after a stat PT and PTT.  Check one set of  cardiac enzymes and troponin.  Place on cardiac monitor.  Chest x-ray, CMP,  and CBC also ordered.  Monitor continuous oxygen saturation.  Elevate lower  extremities with warm compresses to the right thigh and popliteal space. Bed  rest  only.  Dr. Rosalyn Charters office is also notified for their formal consultation  given her continued chest pain.  Of note, EKG in the office was stable and  unchanged since April 29.  Continue current medications.  The patient is  discharged from the office in stable condition under the care of her  daughter for immediate transport to the hospital.     Tarri Fuller, P.A.                     Soyla Murphy. Renne Crigler, M.D.    Dorene Grebe  D:  03/20/2003  T:  03/20/2003  Job:  [id number]

## 2011-03-19 NOTE — Op Note (Signed)
NAME:  [name] [name], [name] [name]                         ACCOUNT NO.:  [id number]   MEDICAL RECORD NO.:  [id number]                   PATIENT TYPE:  AMB   LOCATION:  ENDO                                 FACILITY:  [facility]   PHYSICIAN:  Georgiana Spinner, M.D.                 DATE OF BIRTH:  Apr 08, 1921   DATE OF PROCEDURE:  11/26/2003  DATE OF DISCHARGE:                                 OPERATIVE REPORT   PROCEDURE PERFORMED:  Colonoscopy.   ENDOSCOPIST:  Georgiana Spinner, M.D.   INDICATIONS FOR PROCEDURE:  Colon cancer screening.   ANESTHESIA:  None further given.   DESCRIPTION OF PROCEDURE:  With the patient mildly sedated in the left  lateral decubitus position, the Olympus video colonoscope was inserted in  the rectum and passed under direct vision to the cecum, identified by the  ileocecal valve and appendiceal orifice, both of which were photographed.  From this point the colonoscope was slowly withdrawn taking circumferential  views of the entire colonic mucosa stopping in the rectum which appeared  normal on direct except for a small polyp that was removed using hot biopsy  forceps technique at a setting of 20/200.  Then I placed the endoscope on  retroflex to view the anal canal from above and internal hemorrhoid was seen  and photographed.  The endoscope was straightened and withdrawn.  The  patient's vital signs and pulse oximeter remained stable.  The patient  tolerated the procedure well without apparent complications.   FINDINGS:  Small polyp of rectum, internal hemorrhoids.  Otherwise  unremarkable examination.   PLAN:  Await biopsy report.  Patient will call me for results and follow up  with me as an outpatient.                                               Georgiana Spinner, M.D.    GMO/MEDQ  D:  11/26/2003  T:  11/26/2003  Job:  [id number]

## 2011-03-19 NOTE — Cardiovascular Report (Signed)
[place]  Patient:    [name] [name], [name] [name] Visit Number: [id number] MRN: [id number]          Service Type: CAT Location: CCUB 2901 01 Attending Physician:  Ronaldo Miyamoto Dictated by:   Arturo Morton Riley Kill, M.D. [facility] Proc. Date: 08/08/01 Admit Date:  08/08/2001 Discharge Date: 08/09/2001                          Cardiac Catheterization  INDICATIONS:  [name] [name] is a 77 year old well known to me.  She has a history of prior percutaneous intervention with stenting of the left anterior descending artery and then more recently in March, underwent stenting of the right coronary artery by Dr. Juanda Chance.  This covered an entire ostium all the way down to the mid vessel, and she had been treated conservatively prior to this because of the length of the disease.  She has had some recurrent symptoms recently, so we decided to go ahead and proceed with percutaneous study.  PROCEDURE: 1. Left heart catheterization. 2. Selective coronary arteriography. 3. Selective left ventriculography. 4. PTCA and stenting of the right coronary artery and PTCA of the mid right    coronary.  DESCRIPTION OF PROCEDURE:  The patient was brought to the cath lab and prepped and draped in the usual fashion.  Through an anterior puncture the right femoral artery was easily entered and a 6 French sheath was placed.  Views of the coronary arteries and ventriculography were performed without complication.  Because of the patients hypertension she was given two doses of labetalol 20 mg x 2 and also given intravenous Vasotec 1.25 mg.  She was also given boluses of nitroglycerin, with this she briefly dropped her pressure into the 80s and 90s but this rapidly came back up into the 140 range with some fluids.  With this we then began the procedure.  Heparin was given according to protocol and we elected not to use a IIb/IIIa inhibitor because of its restenosis, and also because the  patient was on Plavix.  A JR4 guiding catheter was utilized and ACT was found to be appropriate.  We tried to control the blood pressure throughout the procedure.  A Luque  wire was passed into the artery and subsequently 2.75 and 3.0 cutting balloons were utilized to dilate not only the mid vessel but also the ostium.  There was a fair amount of elastic recoil at the ostium despite balloon dilatation and we therefore went up with a 3.5 Quantum Ranger in balloon to fairly high pressures.  With this there was very little improvement in the overall appearance of the artery and we elected to stent this vessel.  A 3.5 x 13 Zeta stent was placed at the ostium after careful placement.  There was marked improvement in the appearance of the artery.  We elected not to perform radiation therapy in part because both lesions were fairly focal, and more so also with the placement of a new stent.  She was taken to the holding area in satisfactory condition.  HEMODYNAMIC DATA: 1. Central aortic pressure 186/88 with a mean of 124. 2. LV pressure 202/15 with an LVEDP of 18. 3. No aortic left ventricular gradient was noted.  ANGIOGRAPHIC DATA: 1. The left main coronary artery was free of critical disease. 2. The left anterior descending artery demonstrates about a 30-40% area of    narrowing in the proximal vessel at the proximal stent site.  There is a    tiny little diagonal branch that has 70% narrowing.  There was about 40%    segmental narrowing in the mid vessel at the site of the previous mid    stent.  This was between two diagonal branches.  The second diagonal branch    has about 40% narrowing.  There are multiple areas of mild diffuse    luminal irregularity throughout the LAD, but without critical narrowing. 3. The circumflex provides a marginal and an AV circumflex.  There is about    40% narrowing prior to the bifurcation.  The proximal marginal has about    40% narrowing and the AV  portion 70%. 4. The right coronary artery is a dominant vessel.  It is stented from the    ostium to the mid vessel.  There was a 95% in stent restenosis extending    over about a 10-12 mm area.  This was initially dilated and then    subsequently as noted in the operative note, stented with reduction to 0%    residual, 80% mid stenosis after use of the cutting balloon, was reduced to    about 20% with an excellent angiographic appearance.  The PDA and    posterolateral system had mild luminal irregularities, but without critical    lesions.  LEFT VENTRICULOGRAPHY DATA:  The ventriculogram demonstrates greater than 70% ejection fraction.  No segmental abnormalities or contraction are identified.  CONCLUSIONS: 1. Preserved left ventricular function. 2. Percutaneous repeat dilatation of the ostium and mid portion of the right    coronary arteries at the previous stent sites with subsequent restenting    of the ostium and the right coronary artery.  DISPOSITION:  The patient will be treated medically.  Hopefully her symptoms will be improved.  Will continue to follow her closely. Dictated by:   Arturo Morton Riley Kill, M.D. [facility] Attending Physician:  Ronaldo Miyamoto DD:  08/08/01 TD:  08/08/01 Job: [id number]

## 2011-03-19 NOTE — Discharge Summary (Signed)
[place]  Patient:    [name] [name], [name] [name]                      MRN: [id number] Adm. Date:  [id number] Disc. Date: 01/10/01 Attending:  Learta Codding Dictator:   Delton See, P.A. CC:         Soyla Murphy. Renne Crigler, M.D.   Discharge Summary  HISTORY OF PRESENT ILLNESS:  This is a 77 year old female who was admitted to [facility] with the complaint of chest pain. Her blood pressure was noted to be elevated at the time of admission.  PAST MEDICAL HISTORY: 1. Significant for hypertension. 2. Hyperlipidemia. 3. Coronary artery disease, with a cardiac catheterization in May 2001. 4. A previous PTCA and stent of the LAD in 1998.  ALLERGIES:  PENICILLIN, SULFA, AND DARVOCET, PHENERGAN.  SOCIAL HISTORY:  The patient is widowed.  She lives with her son.  She does not use alcohol or tobacco.  HOSPITAL COURSE:  As noted, this patient was admitted to [facility] through the emergency room with chest pain and severe hypertension, associated with a headache and intermittent blurred vision.  Cardiac enzymes were negative.  As noted, she has known coronary artery disease.  Apparently Dr. Arturo Morton. Stuckey had recommended a PTCA of the RCA in the past, with possible percutaneous intervention on the circumflex coronary artery as well. The patient was scheduled for a cardiac catheterization this admission.  The patients medications were adjusted for her hypertension.  She was also put on a nitroglycerin drip.  On January 06, 2001, the patient underwent a cardiac catheterization performed by Dr. Riley Kill.  The RCA had an 80% proximal lesion, an 80% mid-proximal, 70% mid, and a 70% mid.  The LAD had a 40% proximal lesion.  The first diagonal had a 90% lesion.  The second diagonal had a 75% lesion.  There was a mid-50% lesion.  The circumflex had a 50% lesion, as well as a more distal to mid-70% and 40% lesions.  Please  see Dr. Louretta Shorten complete dictated report for the full details of the  cardiac catheterization.  The ejection fraction was estimated to be 58%.  It was felt that the patient might be a candidate for a coronary artery bypass grafting surgery; however, she apparently was not interested in this option. A decision was made to proceed with percutaneous intervention of the RCA.  On January 09, 2001, the patient underwent a PTCA and stenting of the RCA x 4, reducing two 80% lesions and two 70% lesions to 0%.  The patient tolerated this well.  DISPOSITION:  Arrangements were made to discharge the patient in an improved condition.  Continuous Plavix therapy was recommended.  LABORATORY DATA:  A basic metabolic panel on January 10, 2001, was within normal limits, except for a sodium of 131, BUN 13, creatinine 0.8, potassium 4.0.  A CBC on March 12th revealed a hemoglobin of 11.8, hematocrit 36.2, wbcs 9000, platelets 222,000.  An electrocardiogram showed a normal sinus rhythm, and interpreted as a normal electrocardiogram.  The rate was 63 beats per minute.  The patient had a CT scan of her head without contrast at the time of admission, secondary to severe headache, associated with her hypertension. This showed no acute intracranial abnormalities.  DISCHARGE MEDICATIONS: 1. Plavix 75 mg q.d. 2. Zocor 40 mg q. bedtime. 3. Lopressor 25 mg b.i.d. 4. Enteric-coated aspirin 325 mg q.d. 5. Altace 5  mg b.i.d. 6. Pindolol 5 mg b.i.d. 7. Ambien as previously taken. 8. Imdur 30 mg q.d. 9. Nitroglycerin p.r.n. chest pain.  INSTRUCTIONS:  The patient was told to avoid any strenuous activity or driving for two days.  She is told to call the office if she has any increased pain, swelling, or bleeding from her groin.  DIET:  She is to be on a low-salt, low-fat diet.  FOLLOWUP:  The patient was told to follow up with Dr. Riley Kill on January 23, 2001, at 10 a.m., with Dr. Soyla Murphy. Pharr as needed or as  scheduled.  DISCHARGE DIAGNOSES: 1. Chest pain and hypertension, myocardial infarction ruled out. 2. Hypokalemia, treated and resolved. 3. Coronary artery disease with previous interventions, as noted above. 4. Status post percutaneous transluminal coronary angioplasty and    stenting of the right coronary artery x 4, performed on January 09, 2001. 5. Ejection fraction of 58%. 6. Hypertension. 7. History of supraventricular tachycardia, with an episode on January 05, 2001,    as well as January 08, 2001. DD:  01/10/01 TD:  01/10/01 Job: [id number]

## 2011-03-19 NOTE — Op Note (Signed)
NAME:  [name] [name], [name] [name]               ACCOUNT NO.:  [id number]   MEDICAL RECORD NO.:  [id number]          PATIENT TYPE:  AMB   LOCATION:  ENDO                         FACILITY:  [facility]   PHYSICIAN:  Georgiana Spinner, M.D.    DATE OF BIRTH:  August 03, 1921   DATE OF PROCEDURE:  02/03/2006  DATE OF DISCHARGE:                                 OPERATIVE REPORT   PROCEDURE:  Colonoscopy with polypectomy and eradication of tumor.   ANESTHESIA:  Fentanyl 100 mcg, Versed 10 mg.   PROCEDURE:  With the patient mildly sedated in the left lateral decubitus  position, the Olympus videoscopic colonoscope was inserted into the rectum  and passed under direct vision. With pressure applied, we reached the cecum  identified by the ileocecal valve and appendiceal orifice.  In the cecum was  a polyp that was photographed and subsequently first I tried to remove a  prominent portion of this with a snare, but the snare went right through it.  It was very soft.  There was a small amount of bleeding but it stopped on  its own.  We suctioned this portion of the polyp into the trap and then used  Argon photocoagulator to eradicate the remainder of the polyp which was  fairly extensive but relatively flat.  This went rather easily, we felt and,  therefore, from this point the colonoscope was slowly withdrawn taking  circumferential views of colonic mucosa stopping only in the rectum which  appeared normal in direct and showed hemorrhoids on retroflexed view. The  endoscope was straightened and withdrawn.  The patient's vital signs and  pulse oximeter remained stable.  The patient tolerated procedure well  without apparent complications.   FINDINGS:  Polyp, fairly large, of the cecum eradicated and removed.   PLAN:  Await biopsy reports.  The patient will call me for results and  follow-up with me as an outpatient.  Will probably do colonoscopy in 4-6  weeks just to make sure we have total eradication.     ______________________________  Georgiana Spinner, M.D.     GMO/MEDQ  D:  02/03/2006  T:  02/03/2006  Job:  [id number]

## 2011-03-19 NOTE — Op Note (Signed)
NAME:  [name] [name], [name] [name]               ACCOUNT NO.:  [id number]   MEDICAL RECORD NO.:  [id number]          PATIENT TYPE:  AMB   LOCATION:  ENDO                         FACILITY:  [facility]   PHYSICIAN:  Georgiana Spinner, M.D.    DATE OF BIRTH:  Aug 31, 1921   DATE OF PROCEDURE:  04/21/2006  DATE OF DISCHARGE:                                 OPERATIVE REPORT   PROCEDURE PERFORMED:  Colonoscopy.   INDICATIONS FOR PROCEDURE:  Colon polyp, rectal bleeding.   ANESTHESIA:  Demerol 50 mg, Versed 3 mg.   ENDOSCOPIST:  Georgiana Spinner, M.D.   DESCRIPTION OF PROCEDURE:  With the patient mildly sedated in the left  lateral decubitus position, the Olympus video colonoscope was inserted into  the rectum and passed under direct vision to the cecum identified by  ileocecal valve and appendiceal orifice, and the cecum with the previously  noted polyp that we felt we had eradicated and there had been some regrowth,  so this polyp was snared using snare cautery technique setting 20/200  blended current and the periphery of the polypectomy site was also further  eradicated using hot biopsy forceps just to touch this area.  Once we were  satisfied that there appeared to be no further residual polypoid tissue, the  colonoscope was slowly withdrawn, taking circumferential views of the  remaining colonic mucosa stopping at the hepatic flexure where a polyp  hidden behind a fold was seen, photographed and removed first using a hot  biopsy forceps technique and then eradicated with ERBE argon  photocoagulator.  In retroflex view I could not see any polyp behind this  fold and so we used the argon photocoagulator the remainder of the tissue.  From this point the colonoscope was slowly withdrawn taking circumferential  views of the remaining colonic mucosa.  The patient's vital signs and pulse  oximeter remained stable.  The patient tolerated the procedure well without  apparent complications.   FINDINGS:  Polyps as  described above in the cecum and hepatic flexure area  eradicated hopefully.   PLAN:  Await biopsy report.  The patient will call me for results and follow  up with me as an outpatient.           ______________________________  Georgiana Spinner, M.D.     GMO/MEDQ  D:  04/21/2006  T:  04/21/2006  Job:  [id number]

## 2011-03-19 NOTE — Discharge Summary (Signed)
NAME:  [name] [name], [name] [name]                         ACCOUNT NO.:  [id number]   MEDICAL RECORD NO.:  [id number]                   PATIENT TYPE:  INP   LOCATION:  4711                                 FACILITY:  [facility]   PHYSICIAN:  Soyla Murphy. Renne Crigler, M.D.               DATE OF BIRTH:  1921-07-31   DATE OF ADMISSION:  03/20/2003  DATE OF DISCHARGE:  03/27/2003                                 DISCHARGE SUMMARY   LABORATORY DATA:  EKG sinus bradycardia, rate 53. Radiology report on Mar 20, 2003, chest CT scan with contrast, bilateral segmental pulmonary emboli,  small  pericardial effusion, coronary calcification. Chest x-ray, chronic  obstructive pulmonary disease with suspicion for bronchiectasis, right lower  lobe, cardiomegaly.   On Mar 26, 2003, INR was 2.0. CBC normal. Initial white count was 7.6 with  hemoglobin  13.0. Initial sodium 133, otherwise CMET normal. CK, CK-MB,  troponin I normal.   HOSPITAL COURSE:  Please see admission history and physical for details of  Ms. Six's  presentation. Briefly, she had continue chest pain following a  recent cardiac catheterization as well as pain in her right lower leg with  swelling. She was admitted and found  to have pulmonary emboli and treated  with Lovenox plus Coumadin. Her swelling and pain decreased in her legs. Her  Imdur was discontinued without complications. On Mar 27, 2003, she remained  stable with a therapeutic INR and was discharged to home.   IMPRESSION:  1. Pulmonary embolus, deep venous thrombosis.  2. Coronary artery disease, status post percutaneous transluminal coronary     angioplasty of the left anterior descending artery in 1998, stent     placement to the right coronary artery in October 2002 and drug-eluding     stent placements right coronary artery on February 19, 2003, by Dr. Juanda Chance     with relook angiography 10 days later.  3. Hypertension.  4. Hypercholesteremia.  5. History of renal calculi.  6. Colon  tumor in 1943.  7. Gastroesophageal reflux disease.  8. History of diverticulosis.  9. Osteoporosis.  10.      History of mild anemia.  11.      Mild hyponatremia.   ALLERGIES:  1. PENICILLIN RASH.  2. CODEINE RASH.  3. PHENERGAN NAUSEA AND VOMITING.  4. COZAAR DIZZINESS.  5. ASPIRIN TINGLING (BUT SHE TAKES THAT AND TOLERATES THAT).   DISCHARGE MEDICATIONS:  1. Zocor 40 mg p.o. q. p.m.  2. Multivitamin 1 p.o. every day.  3. Plavix 75 mg p.o. every day.  4. Lopressor 25 mg p.o. b.i.d.  5. Norvasc 10 mg p.o. daily.  6. Protonix 40 mg p.o. every day.  7. Coumadin as directed.  8. Tylenol p.r.n. pain.  9. Micardis 80 mg p.o. every day.   FOLLOW UP:  She will be followed  with the Coumadin clinic at [facility] and see me  in follow up  in 2 weeks in the office.                                               Soyla Murphy. Renne Crigler, M.D.    WDP/MEDQ  D:  04/20/2003  T:  04/22/2003  Job:  [id number]   cc:   Veneda Melter, M.D.    cc:   Veneda Melter, M.D.

## 2011-03-19 NOTE — Discharge Summary (Signed)
[place]. [facility]  Patient:    [name] [name], [name] [name] Visit Number: [id number] MRN: [id number]          Service Type: CAT Location: CCUB 2901 01 Attending Physician:  Ronaldo Miyamoto Dictated by:   Brita Romp, P.A.C. Admit Date:  08/08/2001 Discharge Date: 08/09/2001   CC:         Soyla Murphy. Renne Crigler, M.D.   Discharge Summary  DISCHARGE DIAGNOSES: 1. Coronary artery disease, status post cardiac catheterization with    percutaneous coronary intervention. 2. Hypertension. 3. Hyperlipidemia.  HOSPITAL COURSE:  Ms. Cieslinski is a 77 year old female well-known to Maisie Fus D. Riley Kill, M.D.  She had had two prior coronary interventions. She had been having increasing spells, recurrent substernal chest pain and Dr. Riley Kill felt it was prudent to take the patient back to the cath lab.  CATHETERIZATION RESULTS: 1. Left ventricle:  Ejection fraction greater than 70%, normal wall motion,    no mitral regurgitation. 2. Left anterior descending coronary artery:  There was 30 to 40% restenosis    in the proximal stent and 40% restenosis in the stent in the mid vessel.    There was also approximately 40% stenosis at the takeoff of the second    diagonal. 3. Circumflex system: There was a 40% lesion in the mid vessel.  This was    followed by approximately a 40% lesion just distal to the takeoff of the    obtuse marginal branch. There was also a 70% lesion in the proximal portion    of the OM. 4. Right coronary artery:  There was a 95% lesion in the proximal vessel.    Using a cutting balloon and a stent, this was reduced down to a 0%    residual.  In the mid vessel, there was an 80% lesion which, through the    use of a cutting balloon, was reduced down to less than 20% residual.  On August 09, 2001, the patient was again seen by Dr. Riley Kill.  He noted a blood pressure was 85 systolic.  The IV nitroglycerin was discontinued and he ordered a bolus of 500 mL of  ___________ over an hour.  After the fluid bolus, she was ambulated and did well.  As a result, he felt she was stable for discharge.  DISCHARGE MEDICATIONS: 1. Cozaar 50 mg q.d. 2. Enteric coated aspirin 325 mg q.d. 3. Zocor 40 mg q.h.s. 4. Plavix 75 mg q.d. 5. Lopressor 25 mg q.d. 6. Imdur one half of 30 mg tablet q.d. 7. Sublingual nitroglycerin as needed.  DISCHARGE INSTRUCTIONS: 1. The patient was advised to avoid driving, heavy lifting or tub baths    for two days. 2. She was instructed to follow a low fat, low cholesterol diet. 3. She was advised to watch her cath site for any pain, bleeding or swelling    and to call the [place] for any of these problems. 4. She is to follow up with a P.A. visit for groin check on August 25, 2001,    at 10:15. 5. She is to follow up with Dr. Riley Kill in approximately six weeks, the    office is to call. 6. She is to follow up with Soyla Murphy. Renne Crigler, M.D., as needed or scheduled.  A 2-view chest x-ray showed mildly tortuous thoracic aorta; however, there was no evidence for acute disease.  Electrocardiogram revealed sinus bradycardia at approximately 49, PR interval 136, QRS 78, QTC 408, axis of 36. Dictated by:  [employer], P.A.C. Attending Physician:  Ronaldo Miyamoto DD:  08/09/01 TD:  08/09/01 Job: [id number]

## 2011-03-19 NOTE — Discharge Summary (Signed)
NAME:  [name] [name], [name] [name]                         ACCOUNT NO.:  [id number]   MEDICAL RECORD NO.:  [id number]                   PATIENT TYPE:  INP   LOCATION:  6531                                 FACILITY:  [facility]   PHYSICIAN:  Pricilla Riffle, M.D. [facility]             DATE OF BIRTH:  08/07/1921   DATE OF ADMISSION:  02/18/2003  DATE OF DISCHARGE:  02/20/2003                           DISCHARGE SUMMARY - REFERRING   PROCEDURES:  1. Cardiac catheterization.  2. Coronary arteriogram.  3. Left ventriculogram.  4. Taxus stent x1.  5. CYPHER stent x1.   HOSPITAL COURSE:  [name] [name] is a 77 year old female with a history of  coronary artery disease including a prior stent to the LAD in 1998 and 3  stents to the RCA in 2002.  She was admitted on 02/18/2003 for dyspnea on  exertion as well as a history of chest and back pain at [place].   Her enzymes were negative for MI and she was scheduled for a cardiac  catheterization which was performed on 02/19/2003.  Cardiac catheterization  showed a normal left main, and no instant restenosis in either the proximal  or the mid-distal stent of the LAD.  The circumflex system had no critical  disease.  The RCA, however, had a 99% proximal and 80% midstenosis within  the prior stent.  There was TIMI 1 flow.  Her EF was normal at 60% with no  notable wall motion abnormalities.  She had PTCA and a CYPHER stent as well  as a Taxus stent to the lesions reducing the 99% lesion to 20% and the 80%  lesion to 10%.  Postprocedure there was TIMI 3 flow. She tolerated the  procedure well.   [name] [name] was instructed to continue taking her Plavix for 6 months.  She  had a right groin bruit postprocedure and she had an ultrasound performed to  evaluate for pseudoaneurysm or fistula. There was no evidence of  pseudoaneurysm or AV fistula.   On 02/20/2003 [name] [name] was ambulating without chest pain or shortness of  breath. She was considered stable for discharge on  02/20/2003 with  outpatient follow up arranged.   X-RAY AND LABORATORY DATA:  Sodium 138, potassium 3.8, chloride 107, CO2 24,  BUN 12, creatinine 0.7, glucose 99.  Hemoglobin 11.9, hematocrit 35.3, WBC  7.0, platelets 203.   Chest x-ray:  Chronic changes with no active disease.   DISCHARGE CONDITION:  Improved.   DISCHARGE DIAGNOSES:  1. Unstable anginal pain, status post percutaneous transluminal coronary     angiography and CYPHER stent as well as Taxus stent to the right coronary     artery this admission for in-stent restenosis.  2. History of stent to the right coronary artery x3 in 2002.  3. Status post stents to the proximal and mid-distal left anterior     descending artery in 1998.  4. Preserved left ventricular function with  an ejection fraction of 50% on     catheterization this admission.  5. Anemia secondary to blood loss which is stable.  6. Hyperlipidemia.  7. Right groin bruit without a left groin bruit, no pseudoaneurysm or     fistula by ultrasound.  8. Long standing history of sinus bradycardia, heart rate drops into the 40s     at times.   DISCHARGE INSTRUCTIONS:  1. Her activity level is to include no driving, sexual or strenuous activity     for 2 days.  2. She is to stick to a low fat diet.  3. She is to call the office for problems with the catheterization site.  4. She is to follow up with Dr. Renne Crigler as scheduled.  5. She is to follow up with Dr. Riley Kill on May 20 at 9:45.   DISCHARGE MEDICATIONS:  1. Aspirin 325 mg daily.  2. Zocor 40 mg daily.  3. Plavix 75 mg daily.  4. Imdur is on hold for now.  5. Lopressor 50 mg 1/2 tab b.i.d., patient is to decrease dosage for side     effects including of dizziness or bradycardia.  6. Norvasc 5 mg daily.  7. Micardis 80 mg daily  8. Nitroglycerin p.r.n.  9. Ambien p.r.n. q.h.s.     Lavella Hammock, P.A. [facility]                  Pricilla Riffle, M.D. [facility]    RG/MEDQ  D:  02/20/2003  T:  02/21/2003  Job:   [id number]   cc:   Soyla Murphy. Renne Crigler, M.D.  [address]  [place]  [place] [address]  Fax: [phone]   Arturo Morton. Riley Kill, M.D.

## 2011-03-19 NOTE — H&P (Signed)
NAME:  [name] [name], TURNER                         ACCOUNT NO.:  [id number]   MEDICAL RECORD NO.:  [id number]                   PATIENT TYPE:  INP   LOCATION:  4731                                 FACILITY:  [facility]   PHYSICIAN:  Olga Millers, M.D. [facility]            DATE OF BIRTH:  Jul 11, 1921   DATE OF ADMISSION:  01/19/2004  DATE OF DISCHARGE:                                HISTORY & PHYSICAL   ADDENDUM:   MEDICATIONS:  1. Plavix 75 mg p.o. daily.  2. Metoprolol 25 mg b.i.d.  3. Aspirin 325 mg p.o. daily.  4. Micardis 80 mg p.o. daily.  5. Norvasc 10 mg p.o. daily.  6. Simvastatin 40 mg p.o. q.h.s.  7. Tylenol p.r.n.  8. Ambien 5 mg p.o. q.h.s.  9. Nexium 40 mg p.o. daily.   ALLERGIES:  1. PENICILLIN which causes a rash.  2. DARVOCET which causes jitteriness.  3. PHENERGAN which causes nausea, vomiting.  4. CODEINE which causes a rash.  5. SULFA, unknown allergy.  6. There is also a history of intolerance to COZAAR which caused dizziness,     however, the patient currently denies this and has tolerated Micardis     without difficulty.      [name].                  Olga Millers, M.D. [facility]    CAF/MEDQ  D:  01/19/2004  T:  01/20/2004  Job:  [id number]

## 2011-03-19 NOTE — Discharge Summary (Signed)
NAME:  [name] [name], [name] [name]                         ACCOUNT NO.:  [id number]   MEDICAL RECORD NO.:  [id number]                   PATIENT TYPE:  INP   LOCATION:  2018                                 FACILITY:  [facility]   PHYSICIAN:  Salvadore Farber, M.D.             DATE OF BIRTH:  11-Oct-1921   DATE OF ADMISSION:  02/28/2003  DATE OF DISCHARGE:  03/01/2003                           DISCHARGE SUMMARY - REFERRING   BRIEF HISTORY:  This is a 77 year old female patient of Dr. Tawni Levy  and private patient of Dr. Merri Brunette.  The patient recently underwent a  cardiac catheterization on 02/19/03 performed by Dr. Juanda Chance, and subsequently  had two stents placed in the right coronary artery for InStent restenosis.  Her ejection fraction was 50% at that time.  She previously had right  coronary artery stents x3 performed in 2002.  She also had proximal and mid-  distal LAD stents placed in 1998.   The patient presented to the [facility] cardiology office on 02/28/03 with chest  pain similar to the pain she had experienced prior to her stents.  She was  admitted for further evaluation.   PAST MEDICAL HISTORY:  Please see cardiac history as noted above.  The  patient also has a history of hypertension, elevated lipids,  gastroesophageal reflux disease, renal calculi, and preserved LV function,  multiple stents as noted, history of anemia, and she had a right groin bruit  following her most recent heart catheterization, and ultrasound was negative  for pseudoaneurysm or fistula.   ALLERGIES:  1. CODEINE.  2. PHENERGAN.  3. PENICILLIN.  4. DARVOCET.   SOCIAL HISTORY:  The patient lives in [place] with one of her sons.  She  has three children.  She does not use alcohol or tobacco.   FAMILY HISTORY:  The patient's mother died at age 67 from a myocardial  infarction.  Her father died at age 5 from a CVA.  She has nine brothers  and sisters; one sister with CABG, one brother had CABG.   HOSPITAL COURSE:  As noted, this patient was admitted to [facility]  for further evaluation of chest pain.  Cardiac enzymes were found to be  negative.  The patient underwent cardiac catheterization on 03/01/03 where  stents were found to be widely patent.  There was no other significant  change in her coronary anatomy.  Arrangements were made to discharge the  patient home later that same evening.   LABORATORY DATA:  The CBC on the day of admission revealed hemoglobin of  13.4, hematocrit 40.9, WBC 6.4, platelets 233,000.  A chemistry profile  revealed BUN 11, creatinine 0.7, potassium 4.1, glucose 85, and INR of 0.8.  A PTT was 27, and cardiac enzymes were negative x3.  The chest x-ray showed  no active disease, and EKG on 03/01/03 showed sinus bradycardia at a rate of  57 beats per minute, but  was otherwise interpreted as a normal EKG.   DISCHARGE MEDICATIONS:  The patient was told to continue her same  medications as prior to admission.  This included aspirin 325 mg daily,  Zocor 40 mg daily, Plavix 75 mg daily, Lopressor 25 mg b.i.d., Norvasc 5 mg  daily, Micardis 80 mg daily, nitroglycerin p.r.n., Ambien at bedtime p.r.n.   DISCHARGE INSTRUCTIONS:  The patient was told to avoid any strenuous  activity or driving for two days.  She was told not to lift more than 10  pounds for one week.  She was told to call the [place] office if she had any  increased pain, swelling, or bleeding from her groin.  She was told to call  the [place] office on Monday for a followup appointment with Dr. Riley Kill in  2-3 weeks.  She was to follow up with Dr. Renne Crigler as needed or as scheduled.   DIET:  She is to be on a low-salt, low-fat diet.   PROBLEM LIST AT THE TIME OF DISCHARGE:  1. Recurrent chest pain, myocardial infarction ruled out.  2. Cardiac catheterization performed on 03/01/03 revealing no significant     change in her coronary anatomy.  3. History of stents x2 to the right coronary  artery performed 02/19/03.  4. Preserved left ventricular function with ejection fraction of 60%.  5. Previous stents to the right coronary artery performed in 2002.  6. Previous stents to the left anterior descending performed in 1998.  7. History of elevated lipids.  8. History of hypertension.  9. Gastroesophageal reflux disease.  10.      History of renal calculi.  11.      History of bradycardia.  12.      History of mild anemia.  13.      Right groin bruit with negative ultrasound at her last admission.  14.      Osteoporosis.  15.      Gastroesophageal reflux disease.     Delton See, P.A. [facility]                  Salvadore Farber, M.D.    DR/MEDQ  D:  03/01/2003  T:  03/01/2003  Job:  [id number]   cc:   Soyla Murphy. Renne Crigler, M.D.  [address]  [place]  [place] [address]  Fax: [phone]

## 2011-03-19 NOTE — Op Note (Signed)
NAME:  [name] [name], [name] [name]                         ACCOUNT NO.:  [id number]   MEDICAL RECORD NO.:  [id number]                   PATIENT TYPE:  AMB   LOCATION:  ENDO                                 FACILITY:  [facility]   PHYSICIAN:  Georgiana Spinner, M.D.                 DATE OF BIRTH:  Jul 26, 1921   DATE OF PROCEDURE:  11/26/2003  DATE OF DISCHARGE:                                 OPERATIVE REPORT   PROCEDURE PERFORMED:  Upper endoscopy.   ENDOSCOPIST:  Georgiana Spinner, M.D.   INDICATIONS FOR PROCEDURE:  Abdominal pain.   ANESTHESIA:  Demerol 50 mg, Versed 5 mg.   DESCRIPTION OF PROCEDURE:  With the patient mildly sedated in the left  lateral decubitus position, the Olympus video endoscope was inserted in the  mouth and passed under direct vision through the esophagus which appeared  normal.  We entered into the stomach.  The fundus, body appeared normal.  The antrum showed some erythematous changes in the prepyloric area.  They  were photographed and biopsied. The duodenal bulb and second portion of the  duodenum all appeared normal.  From this point, the endoscope was slowly  withdrawn taking circumferential views of the duodenal mucosa until the  endoscope was pulled back into the stomach and placed on retroflexion to  view the stomach from below.  The endoscope was then straightened and  withdrawn taking circumferential views of the remaining gastric and  esophageal mucosa.  The patient's vital signs and pulse oximeter remained  stable.  The patient tolerated the procedure well without apparent  complications.   FINDINGS:  Erythema of antrum biopsied.  Await biopsy report.  Patient will  call me for results and follow up as an outpatient.  Proceed to colonoscopy.   PLAN:                                               Georgiana Spinner, M.D.    GMO/MEDQ  D:  11/26/2003  T:  11/26/2003  Job:  [id number]

## 2011-03-19 NOTE — Discharge Summary (Signed)
[place]. [facility]  Patient:    [name] [name], [name] [name]                      MRN: [id number] Adm. Date:  [id number] Disc. Date: 03/30/00 Attending:  Ronaldo Miyamoto Dictator:   Leonides Cave, P.A. CC:         Arturo Morton. Riley Kill, M.D. [facility]                           Discharge Summary  DATE OF BIRTH:  [birth year]  DISCHARGE DIAGNOSES: 1. Coronary artery disease without any percutaneous interventions.  Cardiac    catheterization on Mar 28, 2000. 2. Preserved left ventricular function. 3. Hypertension.  BRIEF HISTORY:  The patient is a pleasant 77 year old female who has a history of coronary artery disease.  The patient saw Dr. Bonnee Quin in the [facility] Cardiology office on Mar 25, 2000 for workup of 24 hours of chest discomfort. There was chest discomfort associated with shortness of breath.  She has also recently complained of some pain in her legs.  The patients last catheterization was in 1998.  The patient was admitted from the office to [facility] for set up of cardiac catheterization for recurrent unstable pectoris.  HOSPITAL COURSE:  The patient was also complaining of some bilateral lower extremity pains.  She had a lower extremity venous Doppler done on Mar 25, 2000, which revealed no evidence of thrombosis.  The patient was kept stable over the weekend and was taken to the catheterization lab on Mar 28, 2000 by Dr. Bonnee Quin.  Please see dictated report for full details; however, Dr. Louretta Shorten overall impression was that the LAD stent sites were patent with disease of the first and second diagonal.  The A-V circumflex had progression of disease after a large obtuse marginal branch and there was progressive disease of the RCA.  Dr. Riley Kill originally highly considered PTRA of the RCA with possible PCI of the circumflex artery.  On the following day, Mar 29, 2000, Dr. Riley Kill reviewed the films with Dr. Charlies Constable.  Based on the patients  age, CABG was not felt a good option.  The options included PTRA with long stent with a moderate increase of restenosis or trial medical treatment with the increasing of Norvasc and adding Plavix with her medical regimen.  The doctor talked with the patient and her two sons and the later option was agreed upon.  Therefore, she was discharged home on the following day, Mar 30, 2000, in improved condition.  DISCHARGE MEDICATIONS: 1. Imdur 30 mg q.d. (this is a new medication). 2. Norvasc 10 mg q.d. (this is an increase from 5 mg q.d.). 3. Protonix 40 mg q.d. 4. Zocor 40 mg q.d. 5. Lopressor 25 b.i.d. 6. Coated aspirin 325 q.d. 7. Plavix 75 mg q.d. (this is a new prescription). 8. Nitroglycerin 0.4 p.r.n. for chest pain.  DISCHARGE INSTRUCTIONS:  The patient was instructed to undergo no heavy lifting, driving, or sexual activity for two days.  She was told to adhere to a low fat/low cholesterol diet.  She was told that if bleeding or swelling occurred at the catheterization site she was to call the [facility] Cardiology office immediately.  FOLLOW-UP:  She will have a close follow-up with Dr. Riley Kill and/or his P.A. in the [facility] on Thursday, April 07, 2000 at 4:30 p.m. DD:  03/30/00 TD:  03/30/00 Job: [id number]

## 2011-03-19 NOTE — Discharge Summary (Signed)
NAME:  [name] [name], [name] [name]                         ACCOUNT NO.:  [id number]   MEDICAL RECORD NO.:  [id number]                   PATIENT TYPE:  INP   LOCATION:  2019                                 FACILITY:  [facility]   PHYSICIAN:  Arturo Morton. Riley Kill, M.D. [facility]         DATE OF BIRTH:  1921-08-31   DATE OF ADMISSION:  03/27/2004  DATE OF DISCHARGE:  03/30/2004                           DISCHARGE SUMMARY - REFERRING   DISCHARGE DIAGNOSES:  1. Chest pain felt to be noncardiac.  2. Known coronary artery disease status post percutaneous coronary     intervention to the left anterior descending in 1988, status post     percutaneous coronary intervention to the right coronary artery in 2004.  3. History of pulmonary embolus, deep vein thrombosis; she was kept on     Coumadin for six months.  4. Hyperlipidemia, treated.   HOSPITAL COURSE:  [name] is a 77 year old female who was admitted on Mar 27, 2004, with substernal chest pain.  She does have a known history of  coronary artery disease as mentioned above.  The patient was reluctant to  have cardiac catheterization during this hospitalization and a decision was  made to perform Adenosine Cardiolite.  This test turned out to be normal  with an EF of 68%.  There were no areas of inducible ischemia.   On Mar 30, 2004, the patient was ready for discharge to home.   LABORATORY DATA:  Lab work also revealed cardiac enzymes that were normal.  Potassium 3.9, BUN 6, creatinine 0.8.  Hemoglobin 11.6, hematocrit 34.5 and  platelets of 233,000.  D-diamer was 0.58.  A CT of the chest revealed no  evidence of any acute abnormality with the exception of basilar atelectasis.   DISCHARGE MEDICATIONS:  She was discharged to home on the following  medications:  1. Plavix 75 mg a day.  2. Metoprolol 12.5 mg b.i.d.  3. Aspirin daily.  4. Micardis 80 mg daily.  5. Norvasc 10 mg a day.  6. Tylenol as needed.  7. Ambien 5 mg p.o. q.h.s.  8. Nexium daily.  9.  Zocor 40 mg a day.   ACTIVITY:  As tolerated.   DIET:  Low fat diet.   DISCHARGE INSTRUCTIONS:  Call for questions or concerns at [phone].  The  patient will receive a phone call from my office for followup.      Guy Franco, P.A. [facility]                      Thomas D. Riley Kill, M.D. [facility]    LB/MEDQ  D:  06/02/2004  T:  06/02/2004  Job:  [id number]   cc:   [place] Bing, M.D.

## 2011-03-19 NOTE — Cardiovascular Report (Signed)
[place]. [facility]  Patient:    [name] [name], [name] [name]                        MRN: [id number] Proc. Date: 03/28/00 Attending:  Arturo Morton. Riley Kill, M.D. [facility] CC:         Arturo Morton. Riley Kill, M.D. [facility]             Warrick Parisian, M.D.             CV Laboratory                        Cardiac Catheterization  INDICATIONS:  The patient is a very pleasant 77 year old white female, well known to me with underlying coronary artery disease.  She has had prior stenting of the left anterior descending artery.  She also has known hypercholesterolemia.  She has developed recent recurrent chest discomfort and the current study was done to access coronary anatomy.  PROCEDURES: 1. Left heart catheterization. 2. Selective coronary angiography. 3. Selective left ventriculography. 4. Distal aortography.  DESCRIPTION OF PROCEDURE:  The procedure was performed from the right femoral artery using 6 French catheters.  She tolerated the procedure without complication.  Intracoronary nitroglycerin was administered.  The patient was also given labetalol for hypertension at the completion of the procedure.  HEMODYNAMICS:  The central aortic pressure was 192/79. LV pressure 185/23. There was no gradient on pullback across the aortic valve.  ANGIOGRAPHIC DATA:  The left main coronary artery was free of critical disease.  The left anterior descending artery on plane fluoroscopy demonstrates evidence of two stents.  The first stent has no more than about 30% narrowing to at most 40% in eccentric views.  Likewise, there is about 40% narrowing in the second stent.  In the origin of the first stent is a diagonal branch that has about 70% ostial disease and is moderate in size.  Prior to the second stent is the second diagonal.  This is somewhat small and has about 80-90% narrowing in its opening.  Beyond the second stent is an area of focal stenosis at the distal most aspect of the  stent that measures about 50% in luminal reduction. The distal LAD demonstrates mild luminal irregularity but no critical lesions.  The circumflex provides the origin of two major marginal branches.  At the origin of the circumflex is about 30% narrowing followed by a focal stenosis of about 50% in the midportion prior to the bifurcation.  This appears largely unchanged from the previous study.  It is a modestly calcified lesion.  The origin of the circumflex marginal has about 40% narrowing.  The AV circumflex has about 70% to at most 80% narrowing beyond the origin of the large marginal branch and supplies the second marginal branch.  The right coronary artery has significant calcification at its ostium.  The catheter dampens in the origin and there is probably about 70-80% stenosis. There are second stenoses of 80%, 50% and 70% in tandem fashion involving the proximal to midportion of the right coronary.  The distal vessel provides a posterior descending and several posterolateral branches, all of which are free of critical disease.  LEFT VENTRICULOGRAPHY:  Ventriculography in the RAO projection reveals preserved global systolic function.  No significant mitral regurgitation is noted.  Ejection fraction is 79%.  DISTAL AORTOGRAM:  The distal aortogram demonstrates a relatively smooth appearing distal aorta.  There is bilateral renal arteries  which are widely patent.  The iliacs also appear to be patent.  CONCLUSIONS: 1. Preserved left ventricular function. 2. No evidence of significant restenosis of the previous left anterior    descending stent sites. 3. Involvement of the small diagonals as noted above. 4. Progression of disease at the arteriovenous circumflex. 5. Progression of disease in the right coronary.  DISPOSITION:  The patient will be treated.  We will discuss the options.  My leaning at the present time is to consider percutaneous rotational atherectomy of the right  coronary to involve the ostium and mid vessel.  We will then likely stent this vessel.  It will have at least a moderate chance of restenosis.  The AV circumflex may be approached and I will discuss this with my colleagues. DD:  03/28/00 TD:  03/29/00 Job: [id number]

## 2011-03-19 NOTE — H&P (Signed)
NAME:  [name] [name], [name] [name]                         ACCOUNT NO.:  [id number]   MEDICAL RECORD NO.:  [id number]                   PATIENT TYPE:  INP   LOCATION:  4731                                 FACILITY:  [facility]   PHYSICIAN:  Olga Millers, M.D. [facility]            DATE OF BIRTH:  04-06-1921   DATE OF ADMISSION:  01/19/2004  DATE OF DISCHARGE:                                HISTORY & PHYSICAL   PRIMARY CARDIOLOGIST:  Shawnie Pons, M.D.   CHIEF COMPLAINT:  Chest pain.   HPI:  Patient is an 77 year old female with known coronary disease status  post PCI of LAD in 1988 and PCI of her RCA last on February 19, 2003, with drug  eluding stent x2, now admitted with chest pain.  Patient reports chest pain  and abdominal discomfort since Friday evening beginning after she began  cleaning her bathroom.  She reports it has been off and on for the past  couple of days and occurring every three hours as well.  The pain has been  relieved with Tylenol and Mylanta or Tums.  Her pain is mostly epigastric.  She denies any increasing shortness of breath, no diaphoresis, no  palpitations, no radiation.  No nausea, vomiting or syncope.  Today the pain  worsened to approximately 7/10 and was still located in her epigastric area.  She denies any radiation but she had some nausea but no vomiting.  She also  noted that the pain worsened with exertion.  She took one sublingual  nitroglycerin around 5 p.m. for relief of her pain and then came to the  emergency room for further evaluation.  She reports that this pain is  slightly different from her prior angina in that it does not radiate;  however, her typical angina is mostly epigastric.  Her last episode of pain  prior to this Friday was back in April of 2004.  In the emergency room her  pain recurred two times and she received nitroglycerin on two separate  occasions for relief of her pain.   PAST MEDICAL HISTORY:  1. CAD status post PCI of her LAD in 1998.  2. PCI of her RCA on August 08, 2001.  3. Status post rotational atherectomy with a cutting balloon and drug-     eluting stent x2 on February 19, 2003 with a TAXUS 2.5 x 20 mm and a CYPHER     2.5 x 18 mm stent in her RCA.  She had a re-look cardiac catheterization     on March 01, 2003, which reveals normal left main LAD, a patent stent     with 20% stenosis between the two stents.  She had 30% InStent stenosis     in the distal stent.  There was a small diagonal which was jailed by the     proximal stent with a 90% proximal lesion.  Left circumflex had 30%     proximal  and 40% mid, right coronary artery was noted to be large.  She     had a proximal and mid stent which were widely patent.  Her last EF was     noted to be 60% by a cardiac catheterization on February 19, 2003.  4. Hyperlipidemia.  5. 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  She was     treated with Coumadin for six months.  6. Abdominal pain and gastroesophageal reflux disease status post an EGD on     November 26, 2003, which revealed mild erythema, otherwise normal.     Colonoscopy was done on November 26, 2003, which revealed a small polyp     and internal hemorrhoids, otherwise was normal.  She also has a history     of colon tumor back in 1943 and also had ischemic bowel at that time and     is status post abdominal surgery.  She also has a history of     nephrolithiasis status post stone removal, open surgery, many years ago.   SOCIAL HISTORY:  She lives in [place].  She is widowed.  Occupation:  She  is a retired housewife.  No tobacco, no alcohol, no other drugs, not on  medication.  She eats a soft diet due to some dysphagia and odynophagia.  She does not follow any particular exercise routine.   FAMILY HISTORY:  Mom died at the age of 59 from an MI.  Father died at the  age of 57 from CVA.  She has a sibling, a sister, with CAD.   REVIEW OF SYSTEMS:  No fevers, chills or sweats.  No weight  changes.  No  adenopathy.  She has visual and hearing loss for which she wears glasses.  She had a dental crown that fell out recently.  She denies any new rashes or  skin lesions.  CARDIOPULMONARY:  Per HPI.  She also denies any PND,  orthopnea, no lower extremity edema, no palpitations, no syncope, no cough  or wheezing.  She denies any urinary symptoms, no dysuria or hematuria.  She  has arthralgias of her back and knees, otherwise no joint swelling.  No  vomiting, no diarrhea, no bright red blood per rectum, no melena, no  hematemesis.  She does have dysphagia and odynophagia which has been  ongoing.  Also has GERD and abdominal pain.  She needs to change her diet to  a soft diet and to have the EGD per above.  She denies any polyuria or  dysuria.  All other systems are negative.   PHYSICAL EXAM:  Temperature 97.8, pulse is 71, respiratory rate is 20, blood  pressure 165/85.  Sating 98% on room air.  GENERAL:  She is a pleasant elderly female comfortable, in no acute  distress.  HEENT:  Normocephalic, atraumatic.  Pupils nonreactive to light.  Extraocular muscles intact.  Sclera is white, conjunctiva is pink,  oropharynx is clear.  NECK:  Is supple.  Full range of motion, no bruits.  JVP is approximately 7  cm.  She has no lymphadenopathy.  CARDIOVASCULAR:  Normal S1, split S2.  Regular rate and rhythm.  No S3 or  S4.  No murmurs.  The PMI is nondisplaced.  Pulses are 2+ without bruit.  LUNGS:  Clear bilaterally.  ABDOMEN:  Soft, positive bowel sounds.  No tenderness, no rebound, no  hepatosplenomegaly.  She has multiple scars which are well healed.  RECTAL:  Guaiac  negative.  NEURO:  She is alert and oriented x3.  Cranial nerves are intact except for  hearing being decreased.  Her strength is 5/5 upper extremities and lower  extremities bilaterally.  She has normal sensation.   Chest x-ray shows no acute airspace disease.  EKG:  Rate 61, sinus rhythm, axis is normal, PR interval  is 146, QRS is 88, QTC is 411.  She has no Q-  waves.   LABORATORY DATA:  Creatinine of 0.7, potassium 4.1, total bilirubin 0.9, AST  23, ALT 14, alkaline phosphatase 57, amylase is 27.  __________ cardiac  enzymes with an MB of 1.3, troponin of less than 0.5.  Other labs pending.   ASSESSMENT AND PLAN:  Patient is an 77 year old female with known coronary  disease who is admitted with chest pain and unstable angina.  1. Coronary artery disease.  I will admit her to telemetry, take a look at     cardiac enzymes.  If she is having recurrent pain, I will start her on a     nitroglycerin drip and Lovenox.  I will continue her aspirin, ARB, beta-     blocker, statin and Plavix.  She will likely need a re-look cardiac     catheterization tomorrow.  2. Hypertension.  The blood pressure is elevated.  Will give her home meds     and titrate them as possible.  I am starting her on a nitroglycerin drip     which also will control this.  3. Gastroesophageal reflux disease.  I will continue her on proton pump     inhibitor.  4. Abdominal pain.  I suspect this is her angina equivalent and working up     for coronary disease as above.  I will continue her proton pump inhibitor     for possible gastroesophageal reflux disease, checking amylase and lipase     to evaluate for possible pancreatitis.      [name].                  Olga Millers, M.D. [facility]    CAF/MEDQ  D:  01/19/2004  T:  01/20/2004  Job:  [id number]

## 2011-07-11 ENCOUNTER — Emergency Department (HOSPITAL_COMMUNITY)
Admission: EM | Admit: 2011-07-11 | Discharge: 2011-07-11 | Disposition: A | Discharge status: Home / Self Care | Payer: Medicare Other | Attending: Emergency Medicine | Admitting: Emergency Medicine

## 2011-07-11 ENCOUNTER — Emergency Department (HOSPITAL_COMMUNITY): Payer: Medicare Other

## 2011-07-11 DIAGNOSIS — K219 Gastro-esophageal reflux disease without esophagitis: Secondary | ICD-10-CM | POA: Insufficient documentation

## 2011-07-11 DIAGNOSIS — I2510 Atherosclerotic heart disease of native coronary artery without angina pectoris: Secondary | ICD-10-CM | POA: Insufficient documentation

## 2011-07-11 DIAGNOSIS — Z86718 Personal history of other venous thrombosis and embolism: Secondary | ICD-10-CM | POA: Insufficient documentation

## 2011-07-11 DIAGNOSIS — R1013 Epigastric pain: Secondary | ICD-10-CM | POA: Insufficient documentation

## 2011-07-11 DIAGNOSIS — I10 Essential (primary) hypertension: Secondary | ICD-10-CM | POA: Insufficient documentation

## 2011-07-11 DIAGNOSIS — R110 Nausea: Secondary | ICD-10-CM | POA: Insufficient documentation

## 2011-07-11 DIAGNOSIS — R748 Abnormal levels of other serum enzymes: Secondary | ICD-10-CM | POA: Insufficient documentation

## 2011-07-11 DIAGNOSIS — Z86711 Personal history of pulmonary embolism: Secondary | ICD-10-CM | POA: Insufficient documentation

## 2011-07-11 DIAGNOSIS — E7800 Pure hypercholesterolemia, unspecified: Secondary | ICD-10-CM | POA: Insufficient documentation

## 2011-07-11 DIAGNOSIS — I252 Old myocardial infarction: Secondary | ICD-10-CM | POA: Insufficient documentation

## 2011-07-11 LAB — URINALYSIS, ROUTINE W REFLEX MICROSCOPIC
Bilirubin Urine: NEGATIVE
Glucose, UA: NEGATIVE mg/dL
Ketones, ur: NEGATIVE mg/dL
Leukocytes, UA: NEGATIVE
Protein, ur: NEGATIVE mg/dL
pH: 7 (ref 5.0–8.0)

## 2011-07-11 LAB — DIFFERENTIAL
Basophils Absolute: 0 10*3/uL (ref 0.0–0.1)
Eosinophils Relative: 4 % (ref 0–5)
Lymphocytes Relative: 12 % (ref 12–46)
Lymphs Abs: 0.9 10*3/uL (ref 0.7–4.0)
Neutro Abs: 6.1 10*3/uL (ref 1.7–7.7)

## 2011-07-11 LAB — COMPREHENSIVE METABOLIC PANEL
Albumin: 3.5 g/dL (ref 3.5–5.2)
Alkaline Phosphatase: 170 U/L — ABNORMAL HIGH (ref 39–117)
BUN: 7 mg/dL (ref 6–23)
CO2: 24 mEq/L (ref 19–32)
Chloride: 97 mEq/L (ref 96–112)
Creatinine, Ser: 0.51 mg/dL (ref 0.50–1.10)
GFR calc Af Amer: >60 mL/min (ref >60)
GFR calc non Af Amer: >60 mL/min (ref >60)
Glucose, Bld: 110 mg/dL — ABNORMAL HIGH (ref 70–99)
Potassium: 3.2 mEq/L — ABNORMAL LOW (ref 3.5–5.1)
Total Bilirubin: 0.6 mg/dL (ref 0.3–1.2)

## 2011-07-11 LAB — POCT I-STAT TROPONIN I

## 2011-07-11 LAB — CBC
HCT: 37.7 % (ref 36.0–46.0)
Hemoglobin: 12.6 g/dL (ref 12.0–15.0)
MCV: 81.8 fL (ref 78.0–100.0)
RDW: 13.8 % (ref 11.5–15.5)
WBC: 8.1 10*3/uL (ref 4.0–10.5)

## 2011-07-11 LAB — PROTIME-INR: Prothrombin Time: 19 seconds — ABNORMAL HIGH (ref 11.6–15.2)

## 2011-07-11 LAB — LIPASE, BLOOD: Lipase: 26 U/L (ref 11–59)

## 2011-07-13 LAB — URINE CULTURE: Culture  Setup Time: 201209092007

## 2011-08-10 LAB — URINALYSIS, ROUTINE W REFLEX MICROSCOPIC
Bilirubin Urine: NEGATIVE
Ketones, ur: NEGATIVE
Specific Gravity, Urine: 1.003 — ABNORMAL LOW
Urobilinogen, UA: 0.2

## 2011-08-10 LAB — URINE MICROSCOPIC-ADD ON

## 2011-08-27 ENCOUNTER — Emergency Department (HOSPITAL_COMMUNITY)
Admission: EM | Admit: 2011-08-27 | Discharge: 2011-08-28 | Disposition: A | Discharge status: Home / Self Care | Payer: Medicare Other | Attending: Emergency Medicine | Admitting: Emergency Medicine

## 2011-08-27 ENCOUNTER — Emergency Department (HOSPITAL_COMMUNITY): Payer: Medicare Other

## 2011-08-27 DIAGNOSIS — Z86711 Personal history of pulmonary embolism: Secondary | ICD-10-CM | POA: Insufficient documentation

## 2011-08-27 DIAGNOSIS — I10 Essential (primary) hypertension: Secondary | ICD-10-CM | POA: Insufficient documentation

## 2011-08-27 DIAGNOSIS — I252 Old myocardial infarction: Secondary | ICD-10-CM | POA: Insufficient documentation

## 2011-08-27 DIAGNOSIS — G9389 Other specified disorders of brain: Secondary | ICD-10-CM | POA: Insufficient documentation

## 2011-08-27 DIAGNOSIS — M353 Polymyalgia rheumatica: Secondary | ICD-10-CM | POA: Insufficient documentation

## 2011-08-27 DIAGNOSIS — I2510 Atherosclerotic heart disease of native coronary artery without angina pectoris: Secondary | ICD-10-CM | POA: Insufficient documentation

## 2011-08-27 DIAGNOSIS — R5381 Other malaise: Secondary | ICD-10-CM | POA: Insufficient documentation

## 2011-08-27 DIAGNOSIS — E7800 Pure hypercholesterolemia, unspecified: Secondary | ICD-10-CM | POA: Insufficient documentation

## 2011-08-27 DIAGNOSIS — R5383 Other fatigue: Secondary | ICD-10-CM | POA: Insufficient documentation

## 2011-08-27 DIAGNOSIS — Z86718 Personal history of other venous thrombosis and embolism: Secondary | ICD-10-CM | POA: Insufficient documentation

## 2011-08-27 DIAGNOSIS — G319 Degenerative disease of nervous system, unspecified: Secondary | ICD-10-CM | POA: Insufficient documentation

## 2011-08-27 DIAGNOSIS — R42 Dizziness and giddiness: Secondary | ICD-10-CM | POA: Insufficient documentation

## 2011-08-27 DIAGNOSIS — N390 Urinary tract infection, site not specified: Secondary | ICD-10-CM | POA: Insufficient documentation

## 2011-08-27 LAB — GLUCOSE, CAPILLARY: Glucose-Capillary: 163 mg/dL — ABNORMAL HIGH (ref 70–99)

## 2011-08-27 LAB — URINALYSIS, ROUTINE W REFLEX MICROSCOPIC
Nitrite: POSITIVE — AB
Specific Gravity, Urine: 1.018 (ref 1.005–1.030)
Urobilinogen, UA: 0.2 mg/dL (ref 0.0–1.0)
pH: 6.5 (ref 5.0–8.0)

## 2011-08-27 LAB — URINE MICROSCOPIC-ADD ON

## 2011-08-28 LAB — COMPREHENSIVE METABOLIC PANEL
Alkaline Phosphatase: 75 U/L (ref 39–117)
BUN: 11 mg/dL (ref 6–23)
Calcium: 9.1 mg/dL (ref 8.4–10.5)
Creatinine, Ser: 0.71 mg/dL (ref 0.50–1.10)
GFR calc Af Amer: 87 mL/min — ABNORMAL LOW (ref >90)
Glucose, Bld: 148 mg/dL — ABNORMAL HIGH (ref 70–99)
Potassium: 3.7 mEq/L (ref 3.5–5.1)
Total Protein: 6.1 g/dL (ref 6.0–8.3)

## 2011-08-28 LAB — DIFFERENTIAL
Basophils Relative: 0 % (ref 0–1)
Eosinophils Absolute: 0 10*3/uL (ref 0.0–0.7)
Eosinophils Relative: 0 % (ref 0–5)
Lymphs Abs: 1 10*3/uL (ref 0.7–4.0)
Monocytes Absolute: 1.2 10*3/uL — ABNORMAL HIGH (ref 0.1–1.0)
Monocytes Relative: 7 % (ref 3–12)

## 2011-08-28 LAB — CBC
MCH: 27 pg (ref 26.0–34.0)
MCHC: 33 g/dL (ref 30.0–36.0)
MCV: 81.9 fL (ref 78.0–100.0)
Platelets: 304 10*3/uL (ref 150–400)
RDW: 14.5 % (ref 11.5–15.5)

## 2011-08-28 LAB — CK TOTAL AND CKMB (NOT AT ARMC)
CK, MB: 1.7 ng/mL (ref 0.3–4.0)
Total CK: 81 U/L (ref 7–177)

## 2012-02-02 ENCOUNTER — Emergency Department (HOSPITAL_COMMUNITY): Payer: Medicare Other

## 2012-02-02 ENCOUNTER — Other Ambulatory Visit: Payer: Self-pay

## 2012-02-02 ENCOUNTER — Inpatient Hospital Stay (HOSPITAL_COMMUNITY)
Admission: EM | Admit: 2012-02-02 | Discharge: 2012-02-04 | DRG: 690 | Disposition: A | Discharge status: Home / Self Care | Payer: Medicare Other | Attending: Internal Medicine | Admitting: Internal Medicine

## 2012-02-02 DIAGNOSIS — Z7901 Long term (current) use of anticoagulants: Secondary | ICD-10-CM

## 2012-02-02 DIAGNOSIS — I82509 Chronic embolism and thrombosis of unspecified deep veins of unspecified lower extremity: Secondary | ICD-10-CM | POA: Diagnosis present

## 2012-02-02 DIAGNOSIS — Z9861 Coronary angioplasty status: Secondary | ICD-10-CM

## 2012-02-02 DIAGNOSIS — I10 Essential (primary) hypertension: Secondary | ICD-10-CM

## 2012-02-02 DIAGNOSIS — Z853 Personal history of malignant neoplasm of breast: Secondary | ICD-10-CM

## 2012-02-02 DIAGNOSIS — I2510 Atherosclerotic heart disease of native coronary artery without angina pectoris: Secondary | ICD-10-CM | POA: Diagnosis present

## 2012-02-02 DIAGNOSIS — N390 Urinary tract infection, site not specified: Principal | ICD-10-CM | POA: Diagnosis present

## 2012-02-02 DIAGNOSIS — I8290 Acute embolism and thrombosis of unspecified vein: Secondary | ICD-10-CM | POA: Diagnosis present

## 2012-02-02 DIAGNOSIS — I2782 Chronic pulmonary embolism: Secondary | ICD-10-CM | POA: Diagnosis present

## 2012-02-02 DIAGNOSIS — R0600 Dyspnea, unspecified: Secondary | ICD-10-CM | POA: Diagnosis present

## 2012-02-02 HISTORY — DX: Essential (primary) hypertension: I10

## 2012-02-02 HISTORY — DX: Restless legs syndrome: G25.81

## 2012-02-02 HISTORY — DX: Polymyalgia rheumatica: M35.3

## 2012-02-02 HISTORY — DX: Acute embolism and thrombosis of unspecified vein: I82.90

## 2012-02-02 HISTORY — DX: Atherosclerotic heart disease of native coronary artery without angina pectoris: I25.10

## 2012-02-02 LAB — CBC
MCH: 24.2 pg — ABNORMAL LOW (ref 26.0–34.0)
MCHC: 31.6 g/dL (ref 30.0–36.0)
MCV: 76.7 fL — ABNORMAL LOW (ref 78.0–100.0)
Platelets: 268 10*3/uL (ref 150–400)
RBC: 4.21 MIL/uL (ref 3.87–5.11)

## 2012-02-02 LAB — DIFFERENTIAL
Basophils Relative: 0 % (ref 0–1)
Eosinophils Absolute: 0.1 10*3/uL (ref 0.0–0.7)
Eosinophils Relative: 0 % (ref 0–5)
Lymphs Abs: 1.5 10*3/uL (ref 0.7–4.0)

## 2012-02-02 LAB — COMPREHENSIVE METABOLIC PANEL
ALT: 9 U/L (ref 0–35)
Albumin: 3.5 g/dL (ref 3.5–5.2)
BUN: 13 mg/dL (ref 6–23)
Calcium: 9.1 mg/dL (ref 8.4–10.5)
GFR calc Af Amer: 85 mL/min — ABNORMAL LOW (ref >90)
Glucose, Bld: 91 mg/dL (ref 70–99)
Sodium: 133 mEq/L — ABNORMAL LOW (ref 135–145)
Total Protein: 6.3 g/dL (ref 6.0–8.3)

## 2012-02-02 LAB — URINALYSIS, ROUTINE W REFLEX MICROSCOPIC
Nitrite: POSITIVE — AB
Specific Gravity, Urine: 1.012 (ref 1.005–1.030)
pH: 6 (ref 5.0–8.0)

## 2012-02-02 LAB — PROTIME-INR: INR: 2.45 — ABNORMAL HIGH (ref 0.00–1.49)

## 2012-02-02 LAB — URINE MICROSCOPIC-ADD ON

## 2012-02-02 LAB — APTT: aPTT: 34 seconds (ref 24–37)

## 2012-02-02 MED ORDER — LEVOFLOXACIN IN D5W 500 MG/100ML IV SOLN
500.0000 mg | INTRAVENOUS | Status: AC
  Administered 2012-02-02: 500 mg via INTRAVENOUS
  Filled 2012-02-02: qty 100

## 2012-02-02 MED ORDER — ALBUTEROL SULFATE (5 MG/ML) 0.5% IN NEBU
5.0000 mg | INHALATION_SOLUTION | Freq: Once | RESPIRATORY_TRACT | Status: AC
  Administered 2012-02-02: 5 mg via RESPIRATORY_TRACT
  Filled 2012-02-02: qty 1

## 2012-02-02 MED ORDER — ASPIRIN 325 MG PO TABS: 325.0000 mg | ORAL_TABLET | Freq: Every day | ORAL | Status: DC

## 2012-02-02 MED ORDER — METHYLPREDNISOLONE SODIUM SUCC 125 MG IJ SOLR
125.0000 mg | Freq: Once | INTRAMUSCULAR | Status: AC
  Administered 2012-02-02: 125 mg via INTRAVENOUS
  Filled 2012-02-02: qty 2

## 2012-02-02 MED ORDER — LEVOFLOXACIN IN D5W 500 MG/100ML IV SOLN: 500.0000 mg | INTRAVENOUS | Status: DC

## 2012-02-02 MED ORDER — IOHEXOL 350 MG/ML SOLN
100.0000 mL | Freq: Once | INTRAVENOUS | Status: AC | PRN
  Administered 2012-02-02: 100 mL via INTRAVENOUS

## 2012-02-02 NOTE — ED Provider Notes (Signed)
History     CSN: [id number]  Arrival date & time 02/02/12  1552   First MD Initiated Contact with Patient 02/02/12 1553      Chief Complaint  Patient presents with  . Chest Pain    (Consider location/radiation/quality/duration/timing/severity/associated sxs/prior treatment) HPI Pt p/w chest tightness and sob with wheezing since yesterday. Chest pain tightness has improved significantly. No fever, chills. + bl calf tenderness. Pt is supra therapeutic on coumadin with last INR >5. Has hx of prev PE.   No past medical history on file.  No past surgical history on file.  No family history on file.  History  Substance Use Topics  . Smoking status: Not on file  . Smokeless tobacco: Not on file  . Alcohol Use: Not on file    OB History    No data available      Review of Systems  Constitutional: Positive for fatigue. Negative for fever and chills.  Respiratory: Positive for cough, chest tightness, shortness of breath and wheezing.   Cardiovascular: Positive for chest pain. Negative for palpitations and leg swelling.  Gastrointestinal: Positive for nausea and abdominal pain. Negative for vomiting.  Skin: Negative for rash and wound.  Neurological: Negative for dizziness and headaches.    Allergies  Amoxicillin; Codeine phosphate; Promethazine hcl; Propoxacet-n; and Sulfamethoxazole  Home Medications   Current Outpatient Rx  Name Route Sig Dispense Refill  . AMLODIPINE BESYLATE 10 MG PO TABS Oral Take 10 mg by mouth daily.    . ATORVASTATIN CALCIUM 20 MG PO TABS Oral Take 10 mg by mouth daily.    Marland Kitchen CLOPIDOGREL BISULFATE 75 MG PO TABS Oral Take 75 mg by mouth daily.    . DONEPEZIL HCL 5 MG PO TABS Oral Take 5 mg by mouth every morning.    Marland Kitchen LORAZEPAM 0.5 MG PO TABS Oral Take 0.25 mg by mouth every 8 (eight) hours. For anxiety    . POTASSIUM CHLORIDE CRYS ER 20 MEQ PO TBCR Oral Take 20 mEq by mouth daily.    Marland Kitchen PREDNISONE 1 MG PO TABS Oral Take 2 mg by mouth daily.    Marland Kitchen  ROPINIROLE HCL 1 MG PO TABS Oral Take 1 mg by mouth at bedtime.    Marland Kitchen ZALEPLON 10 MG PO CAPS Oral Take 10 mg by mouth at bedtime.    Marland Kitchen NITROFURANTOIN MACROCRYSTAL 100 MG PO CAPS Oral Take 100 mg by mouth 2 (two) times daily. For seven days starting 01/21/12      BP 164/68  Pulse 62  Temp(Src) 98.6 F (37 C) (Oral)  Resp 20  SpO2 98%  Physical Exam  Nursing note and vitals reviewed. Constitutional: She is oriented to person, place, and time. She appears well-developed and well-nourished. No distress.  HENT:  Head: Normocephalic and atraumatic.  Mouth/Throat: Oropharynx is clear and moist.  Eyes: EOM are normal. Pupils are equal, round, and reactive to light.  Neck: Normal range of motion. Neck supple.  Cardiovascular: Normal rate and regular rhythm.   Pulmonary/Chest: Effort normal. No respiratory distress. She has wheezes (diffuse exp wheezing). She has no rales.  Abdominal: Soft. Bowel sounds are normal. There is no tenderness. There is no rebound and no guarding.  Musculoskeletal: Normal range of motion. She exhibits tenderness (Bl calf tenderness, 2+ DP pulses). She exhibits no edema.  Neurological: She is alert and oriented to person, place, and time.       5/5 motor, sensation intect  Skin: Skin is warm and dry. No rash  noted. No erythema.  Psychiatric: She has a normal mood and affect. Her behavior is normal.    ED Course  Procedures (including critical care time)  Labs Reviewed  CBC - Abnormal; Notable for the following:    WBC 13.4 (*)    Hemoglobin 10.2 (*)    HCT 32.3 (*)    MCV 76.7 (*)    MCH 24.2 (*)    RDW 16.2 (*)    All other components within normal limits  DIFFERENTIAL - Abnormal; Notable for the following:    Neutrophils Relative 81 (*)    Neutro Abs 10.9 (*)    Lymphocytes Relative 11 (*)    All other components within normal limits  COMPREHENSIVE METABOLIC PANEL - Abnormal; Notable for the following:    Sodium 133 (*)    Total Bilirubin 0.2 (*)     GFR calc non Af Amer 73 (*)    GFR calc Af Amer 85 (*)    All other components within normal limits  URINALYSIS, ROUTINE W REFLEX MICROSCOPIC - Abnormal; Notable for the following:    APPearance HAZY (*)    Hgb urine dipstick MODERATE (*)    Nitrite POSITIVE (*)    Leukocytes, UA TRACE (*)    All other components within normal limits  PROTIME-INR - Abnormal; Notable for the following:    Prothrombin Time 27.0 (*)    INR 2.45 (*)    All other components within normal limits  URINE MICROSCOPIC-ADD ON - Abnormal; Notable for the following:    Bacteria, UA MANY (*)    All other components within normal limits  APTT  CARDIAC PANEL(CRET KIN+CKTOT+MB+TROPI)  TROPONIN I  URINE CULTURE   Dg Chest 2 View  02/02/2012  *RADIOLOGY REPORT*  Clinical Data: Mid chest pain radiating to left shoulder left arm, shortness of breath, nausea, history coronary artery disease post stenting  CHEST - 2 VIEW  Comparison: 08/28/2011  Findings: Enlargement of cardiac silhouette. Calcified tortuous aorta. Pulmonary vascularity normal. Chronic elevation of right diaphragm. Minimal right basilar atelectasis. Remaining lungs clear. No pleural effusion or pneumothorax. Bones appear demineralized with chronic compression deformities of mid lower thoracic vertebrae.  IMPRESSION: Enlargement of cardiac silhouette. Chronic elevation of right diaphragm with right basilar atelectasis. No acute abnormalities. Chronic thoracic spine compression deformities.  Original Report Authenticated By: Lollie Marrow, M.D.   Ct Angio Chest W/cm &/or Wo Cm  02/02/2012  *RADIOLOGY REPORT*  Clinical Data: Chest pain, shortness of breath, nausea.  CT ANGIOGRAPHY CHEST  Technique:  Multidetector CT imaging of the chest using the standard protocol during bolus administration of intravenous contrast. Multiplanar reconstructed images including MIPs were obtained and reviewed to evaluate the vascular anatomy.  Contrast:  100 ml Omnipaque 300  Comparison:  11/18/2008  Findings: Technically adequate study with good opacification of the central and segmental pulmonary arteries.  No focal filling defects.  No evidence of significant pulmonary embolus.  Normal caliber thoracic aorta with mild calcification.  Calcification of the coronary arteries.  Visualized portions of the upper abdominal organs are unremarkable.  No pleural effusions.  No significant lymphadenopathy in the chest.  The esophagus is decompressed. Visualization of lung fields is limited due to respiratory motion artifact but there is no evidence of significant consolidation or interstitial disease.  Mild dependent atelectasis in the bases. The airways appear patent.  No pneumothorax.  Degenerative changes in the thoracic spine.  Compression fractures of T9, representing about 50% loss of height and of T12, representing about  25% loss of height.  These are stable since the previous study.  IMPRESSION: No evidence of significant pulmonary embolus.  Original Report Authenticated By: Marlon Pel, M.D.     1. UTI (urinary tract infection)   2. Dyspnea      Date: 02/02/2012  Rate: 60  Rhythm: normal sinus rhythm  QRS Axis: normal  Intervals: normal  ST/T Wave abnormalities: normal  Conduction Disutrbances:none  Narrative Interpretation:   Old EKG Reviewed: unchanged    MDM  Pt state she is breathing easier. No wheezing. VSS. [facility] to admit        Loren Racer, MD 02/02/12 2330

## 2012-02-02 NOTE — ED Notes (Signed)
Pt resting quietly with no s/s of any pain or distress observed. Pt denies any pain or complaints, lights dimmed for comfort measures. Plan of care is updated with verbal understanding, will continue to monitor pt. Pt is awaiting admission stay.

## 2012-02-02 NOTE — ED Notes (Signed)
Pt brought in per [name] c/o CP mid chest non radiating, pt c/o SOB, Nausea, pt denies vomiting, & diarrhea, Pt received 324 ASA & 2 SL nitro in route

## 2012-02-02 NOTE — ED Notes (Signed)
Please call son Gerlene Burdock, son if plan of care changes [phone]

## 2012-02-02 NOTE — ED Notes (Signed)
Pt seen yesterday by PCP, pt was instructed to discontinue Coumadin 2.5mg  tab once daily d/t to INR 5/01, pt has hx of pulmonary emboli last year

## 2012-02-03 ENCOUNTER — Encounter (HOSPITAL_COMMUNITY): Payer: Self-pay | Admitting: Internal Medicine

## 2012-02-03 DIAGNOSIS — I10 Essential (primary) hypertension: Secondary | ICD-10-CM | POA: Diagnosis present

## 2012-02-03 DIAGNOSIS — I2510 Atherosclerotic heart disease of native coronary artery without angina pectoris: Secondary | ICD-10-CM | POA: Diagnosis present

## 2012-02-03 DIAGNOSIS — R0600 Dyspnea, unspecified: Secondary | ICD-10-CM

## 2012-02-03 DIAGNOSIS — N390 Urinary tract infection, site not specified: Secondary | ICD-10-CM | POA: Diagnosis present

## 2012-02-03 HISTORY — DX: Dyspnea, unspecified: R06.00

## 2012-02-03 LAB — GLUCOSE, CAPILLARY
Glucose-Capillary: 177 mg/dL — ABNORMAL HIGH (ref 70–99)
Glucose-Capillary: 103 mg/dL — ABNORMAL HIGH (ref 70–99)

## 2012-02-03 LAB — PROTIME-INR
INR: 1.84 — ABNORMAL HIGH (ref 0.00–1.49)
Prothrombin Time: 21.6 s — ABNORMAL HIGH (ref 11.6–15.2)

## 2012-02-03 LAB — CBC
HCT: 32.7 % — ABNORMAL LOW (ref 36.0–46.0)
Hemoglobin: 10.4 g/dL — ABNORMAL LOW (ref 12.0–15.0)
MCH: 24.3 pg — ABNORMAL LOW (ref 26.0–34.0)
MCV: 76.4 fL — ABNORMAL LOW (ref 78.0–100.0)
RBC: 4.28 MIL/uL (ref 3.87–5.11)

## 2012-02-03 LAB — BASIC METABOLIC PANEL
CO2: 19 mEq/L (ref 19–32)
Glucose, Bld: 145 mg/dL — ABNORMAL HIGH (ref 70–99)
Potassium: 4.1 mEq/L (ref 3.5–5.1)
Sodium: 134 mEq/L — ABNORMAL LOW (ref 135–145)

## 2012-02-03 MED ORDER — DONEPEZIL HCL 5 MG PO TABS
5.0000 mg | ORAL_TABLET | Freq: Every morning | ORAL | Status: DC
  Administered 2012-02-03 – 2012-02-04 (×2): 5 mg via ORAL
  Filled 2012-02-03 (×2): qty 1

## 2012-02-03 MED ORDER — WARFARIN - PHARMACIST DOSING INPATIENT
Freq: Every day | Status: DC
  Administered 2012-02-03: 18:00:00

## 2012-02-03 MED ORDER — DOCUSATE SODIUM 100 MG PO CAPS
100.0000 mg | ORAL_CAPSULE | Freq: Two times a day (BID) | ORAL | Status: DC
  Administered 2012-02-03 – 2012-02-04 (×3): 100 mg via ORAL
  Filled 2012-02-03 (×4): qty 1

## 2012-02-03 MED ORDER — AMLODIPINE BESYLATE 10 MG PO TABS
10.0000 mg | ORAL_TABLET | Freq: Every day | ORAL | Status: DC
  Administered 2012-02-03 – 2012-02-04 (×2): 10 mg via ORAL
  Filled 2012-02-03 (×3): qty 1

## 2012-02-03 MED ORDER — WARFARIN SODIUM 5 MG PO TABS
5.0000 mg | ORAL_TABLET | Freq: Once | ORAL | Status: AC
  Administered 2012-02-03: 5 mg via ORAL
  Filled 2012-02-03: qty 1

## 2012-02-03 MED ORDER — ONDANSETRON HCL 4 MG/2ML IJ SOLN
4.0000 mg | Freq: Four times a day (QID) | INTRAMUSCULAR | Status: DC | PRN
  Filled 2012-02-03: qty 2

## 2012-02-03 MED ORDER — ACETAMINOPHEN 325 MG PO TABS
650.0000 mg | ORAL_TABLET | Freq: Four times a day (QID) | ORAL | Status: DC | PRN
  Administered 2012-02-03 (×2): 650 mg via ORAL
  Filled 2012-02-03 (×2): qty 2

## 2012-02-03 MED ORDER — COUMADIN BOOK
1.0000 | Freq: Once | Status: AC
  Administered 2012-02-03: 1
  Filled 2012-02-03: qty 1

## 2012-02-03 MED ORDER — ACETAMINOPHEN 650 MG RE SUPP: 650.0000 mg | Freq: Four times a day (QID) | RECTAL | Status: DC | PRN

## 2012-02-03 MED ORDER — ONDANSETRON HCL 4 MG PO TABS: 4.0000 mg | ORAL_TABLET | Freq: Four times a day (QID) | ORAL | Status: DC | PRN

## 2012-02-03 MED ORDER — SIMVASTATIN 40 MG PO TABS: 40.0000 mg | ORAL_TABLET | Freq: Every day | ORAL | Status: DC

## 2012-02-03 MED ORDER — WARFARIN VIDEO: 1.0000 | Freq: Once | Status: DC

## 2012-02-03 MED ORDER — AMLODIPINE BESYLATE 10 MG PO TABS
10.0000 mg | ORAL_TABLET | Freq: Every day | ORAL | Status: DC
  Filled 2012-02-03: qty 1

## 2012-02-03 MED ORDER — PREDNISONE 1 MG PO TABS
2.0000 mg | ORAL_TABLET | Freq: Every day | ORAL | Status: DC
  Administered 2012-02-03 – 2012-02-04 (×2): 2 mg via ORAL
  Filled 2012-02-03 (×2): qty 2

## 2012-02-03 MED ORDER — SODIUM CHLORIDE 0.9 % IV SOLN
Freq: Once | INTRAVENOUS | Status: AC
  Administered 2012-02-03: 01:00:00 via INTRAVENOUS

## 2012-02-03 MED ORDER — SENNA 8.6 MG PO TABS
1.0000 | ORAL_TABLET | Freq: Two times a day (BID) | ORAL | Status: DC
  Administered 2012-02-03 – 2012-02-04 (×3): 8.6 mg via ORAL
  Filled 2012-02-03 (×4): qty 1

## 2012-02-03 MED ORDER — ATORVASTATIN CALCIUM 20 MG PO TABS
20.0000 mg | ORAL_TABLET | Freq: Every day | ORAL | Status: DC
  Administered 2012-02-03: 20 mg via ORAL
  Filled 2012-02-03 (×2): qty 1

## 2012-02-03 MED ORDER — CLOPIDOGREL BISULFATE 75 MG PO TABS
75.0000 mg | ORAL_TABLET | Freq: Every day | ORAL | Status: DC
Start: 2012-02-03 — End: 2012-02-04
  Administered 2012-02-03 – 2012-02-04 (×2): 75 mg via ORAL
  Filled 2012-02-03 (×3): qty 1

## 2012-02-03 MED ORDER — CIPROFLOXACIN IN D5W 200 MG/100ML IV SOLN
200.0000 mg | Freq: Two times a day (BID) | INTRAVENOUS | Status: DC
  Administered 2012-02-03 – 2012-02-04 (×4): 200 mg via INTRAVENOUS
  Filled 2012-02-03 (×5): qty 100

## 2012-02-03 MED ORDER — ROPINIROLE HCL 1 MG PO TABS
1.0000 mg | ORAL_TABLET | Freq: Every day | ORAL | Status: DC
  Administered 2012-02-03: 1 mg via ORAL
  Filled 2012-02-03 (×2): qty 1

## 2012-02-03 MED ORDER — TRAZODONE 25 MG HALF TABLET
25.0000 mg | ORAL_TABLET | Freq: Every evening | ORAL | Status: DC | PRN
  Administered 2012-02-03: 25 mg via ORAL
  Filled 2012-02-03: qty 1

## 2012-02-03 NOTE — Evaluation (Addendum)
Physical Therapy Evaluation Patient Details Name: [name] [name] MRN: [id number] DOB: 12-30-1920 Today's Date: 02/03/2012  Problem List:  Patient Active Problem List  Diagnoses  . HYPERCHOLESTEROLEMIA  IIA  . HYPERLIPIDEMIA-MIXED  . HYPERTENSION, BENIGN  . CAD (coronary artery disease)  . VTE (venous thromboembolism)  . HTN (hypertension)  . Dyspnea  . UTI (urinary tract infection)    Past Medical History:  Past Medical History  Diagnosis Date  . CAD (coronary artery disease)     S/p PTCA / stenting, unclear when/where  . 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 Started coumadin  . Breast CA 2005-2006    S/p lumpectomy and radiation  . HTN (hypertension)   . Restless leg syndrome   . PMR (polymyalgia rheumatica)    Past Surgical History: History reviewed. No pertinent past surgical history.  PT Assessment/Plan/Recommendation PT Assessment Clinical Impression Statement: Pt is an 78 y/o female admitted with UTI. Pt' s HR signicantly increases with minimal activity.  O2 sats on Room air greater than 94 throughout session. RR rate incrases with activity.  Pt may benefit from consideration of out-patient cardio-pulmonary rehab progam to address activity tolerance.    PT Recommendation/Assessment: Patient will need skilled PT in the acute care venue PT Problem List: Decreased activity tolerance;Cardiopulmonary status limiting activity;Decreased knowledge of use of DME Barriers to Discharge: None PT Therapy Diagnosis : Difficulty walking PT Plan PT Frequency: Min 2X/week PT Treatment/Interventions: Gait training;Therapeutic activities;DME instruction;Patient/family education PT Recommendation Recommendations for Other Services: Other (comment) (out patient cardio-pulmonary rehab) Follow Up Recommendations: No PT follow up Equipment Recommended: None recommended by PT PT Goals  Acute Rehab PT Goals PT Goal Formulation: With patient Time For Goal Achievement: 7  days Pt will Ambulate: 51 - 150 feet;with modified independence;with least restrictive assistive device PT Goal: Ambulate - Progress: Goal set today  PT Evaluation Precautions/Restrictions  Restrictions Weight Bearing Restrictions: No Prior Functioning  Home Living Lives With: Sheran Spine Help From: Family Type of Home: House Home Layout: One level Home Access: Level entry Bathroom Shower/Tub: Tub/shower unit;Curtain Firefighter: Standard Bathroom Accessibility: Yes How Accessible: Accessible via walker Home Adaptive Equipment: Shower chair with back;Grab bars in shower;Straight cane;Walker - rolling Prior Function Level of Independence: Independent with basic ADLs;Independent with gait;Independent with transfers;Independent with homemaking with ambulation;Requires assistive device for independence Driving: Yes Vocation: Retired Leisure: Hobbies-no Cognition Cognition Arousal/Alertness: Awake/alert Overall Cognitive Status: Appears within functional limits for tasks assessed Sensation/Coordination Sensation Light Touch: Appears Intact Proprioception: Appears Intact Coordination Gross Motor Movements are Fluid and Coordinated: Yes Fine Motor Movements are Fluid and Coordinated: Yes Extremity Assessment RUE Assessment RUE Assessment: Within Functional Limits LUE Assessment LUE Assessment: Within Functional Limits RLE Assessment RLE Assessment: Within Functional Limits LLE Assessment LLE Assessment: Within Functional Limits Mobility (including Balance) Bed Mobility Bed Mobility: Yes Supine to Sit: 7: Independent;HOB flat Sit to Supine: 7: Independent;HOB flat Transfers Transfers: Yes Sit to Stand: 7: Independent;From bed;With upper extremity assist Stand to Sit: 7: Independent;To chair/3-in-1;With upper extremity assist Ambulation/Gait Ambulation/Gait: Yes Ambulation/Gait Assistance: 6: Modified independent (Device/Increase time) Ambulation Distance (Feet):  100 Feet Assistive device: Rolling walker Gait Pattern: Within Functional Limits Gait velocity: WFL Stairs: No Wheelchair Mobility Wheelchair Mobility: No  Posture/Postural Control Posture/Postural Control: No significant limitations Balance Balance Assessed: No Exercise    End of Session PT - End of Session Equipment Utilized During Treatment: Gait belt Activity Tolerance: Patient limited by fatigue (HR increased to from 64-94 with bed mobilty 107 with  gait) Patient left: in chair;with call bell in reach Nurse Communication: Mobility status for transfers;Mobility status for ambulation General Behavior During Session: [facility] for tasks performed Cognition: [facility] for tasks performed  Mahaley Schwering 02/03/2012, 12:56 PM Yaiza Palazzola L. Beonca Gibb DPT [phone]

## 2012-02-03 NOTE — Progress Notes (Signed)
DAILY PROGRESS NOTE                              [facility]  SUBJECTIVE: Feels better, denies shortness of breath. She is on oxygen we'll try to wean her off.  OBJECTIVE: BP 179/74  Pulse 59  Temp(Src) 98.1 F (36.7 C) (Oral)  Resp 18  Ht 5\' 2"  (1.575 m)  Wt 65.9 kg (145 lb 4.5 oz)  BMI 26.57 kg/m2  SpO2 98%  Intake/Output Summary (Last 24 hours) at 02/03/12 0851 Last data filed at 02/03/12 0500  Gross per 24 hour  Intake    220 ml  Output    400 ml  Net   -180 ml                      Weight change:  Physical Exam: General: Alert and awake oriented x3 not in any acute distress. HEENT: anicteric sclera, pupils equal reactive to light and accommodation CVS: S1-S2 heard, no murmur rubs or gallops Chest: clear to auscultation bilaterally, no wheezing rales or rhonchi Abdomen:  normal bowel sounds, soft, nontender, nondistended, no organomegaly Neuro: Cranial nerves II-XII intact, no focal neurological deficits Extremities: no cyanosis, no clubbing or edema noted bilaterally   Lab Results:  Basename 02/03/12 0510 02/02/12 1623  NA 134* 133*  K 4.1 4.2  CL 99 100  CO2 19 23  GLUCOSE 145* 91  BUN 11 13  CREATININE 0.68 0.76  CALCIUM 9.4 9.1  MG -- --  PHOS -- --    Basename 02/02/12 1623  AST 13  ALT 9  ALKPHOS 49  BILITOT 0.2*  PROT 6.3  ALBUMIN 3.5   No results found for this basename: LIPASE:2,AMYLASE:2 in the last 72 hours  Basename 02/03/12 0510 02/02/12 1623  WBC 18.0* 13.4*  NEUTROABS -- 10.9*  HGB 10.4* 10.2*  HCT 32.7* 32.3*  MCV 76.4* 76.7*  PLT 303 268    Basename 02/02/12 2130 02/02/12 1624  CKTOTAL -- 127  CKMB -- 1.4  CKMBINDEX -- --  TROPONINI <0.30 <0.30   Micro Results: No results found for this or any previous visit (from the past 240 hour(s)).  Studies/Results: Dg Chest 2 View  02/02/2012  *RADIOLOGY REPORT*  Clinical Data: Mid chest pain radiating to left shoulder left arm, shortness of  breath, nausea, history coronary artery disease post stenting  CHEST - 2 VIEW  Comparison: 08/28/2011  Findings: Enlargement of cardiac silhouette. Calcified tortuous aorta. Pulmonary vascularity normal. Chronic elevation of right diaphragm. Minimal right basilar atelectasis. Remaining lungs clear. No pleural effusion or pneumothorax. Bones appear demineralized with chronic compression deformities of mid lower thoracic vertebrae.  IMPRESSION: Enlargement of cardiac silhouette. Chronic elevation of right diaphragm with right basilar atelectasis. No acute abnormalities. Chronic thoracic spine compression deformities.  Original Report Authenticated By: Lollie Marrow, M.D.   Ct Angio Chest W/cm &/or Wo Cm  02/02/2012  *RADIOLOGY REPORT*  Clinical Data: Chest pain, shortness of breath, nausea.  CT ANGIOGRAPHY CHEST  Technique:  Multidetector CT imaging of the chest using the standard protocol during bolus administration of intravenous contrast. Multiplanar reconstructed images including MIPs were obtained and reviewed to evaluate the vascular anatomy.  Contrast:  100 ml Omnipaque 300  Comparison: 11/18/2008  Findings: Technically adequate study with good opacification of the central and segmental pulmonary arteries.  No focal filling defects.  No evidence of significant pulmonary embolus.  Normal  caliber thoracic aorta with mild calcification.  Calcification of the coronary arteries.  Visualized portions of the upper abdominal organs are unremarkable.  No pleural effusions.  No significant lymphadenopathy in the chest.  The esophagus is decompressed. Visualization of lung fields is limited due to respiratory motion artifact but there is no evidence of significant consolidation or interstitial disease.  Mild dependent atelectasis in the bases. The airways appear patent.  No pneumothorax.  Degenerative changes in the thoracic spine.  Compression fractures of T9, representing about 50% loss of height and of T12,  representing about 25% loss of height.  These are stable since the previous study.  IMPRESSION: No evidence of significant pulmonary embolus.  Original Report Authenticated By: Marlon Pel, M.D.   Medications: Scheduled Meds:   . sodium chloride   Intravenous Once  . albuterol  5 mg Nebulization Once  . amLODipine  10 mg Oral Daily  . atorvastatin  20 mg Oral q1800  . ciprofloxacin  200 mg Intravenous Q12H  . clopidogrel  75 mg Oral Q breakfast  . docusate sodium  100 mg Oral BID  . donepezil  5 mg Oral q morning - 10a  . levofloxacin (LEVAQUIN) IV  500 mg Intravenous To Major  . methylPREDNISolone (SOLU-MEDROL) injection  125 mg Intravenous Once  . predniSONE  2 mg Oral Daily  . rOPINIRole  1 mg Oral QHS  . senna  1 tablet Oral BID  . Warfarin - Pharmacist Dosing Inpatient   Does not apply q1800  . DISCONTD: amLODipine  10 mg Oral Daily  . DISCONTD: aspirin  325 mg Oral Daily  . DISCONTD: levofloxacin (LEVAQUIN) IV  500 mg Intravenous Q24H  . DISCONTD: simvastatin  40 mg Oral Daily   Continuous Infusions:  PRN Meds:.acetaminophen, acetaminophen, iohexol, ondansetron (ZOFRAN) IV, ondansetron  ASSESSMENT & PLAN: Principal Problem:  *UTI (urinary tract infection) Active Problems:  CAD (coronary artery disease)  VTE (venous thromboembolism)  HTN (hypertension)  Dyspnea   UTI -Urine culture obtained, patient is ciprofloxacin. -Patient was on nitrofurantoin, is clear she has recurrent UTIs as she did not know that. We'll adjust antibiotics according to cultures.  Shortness of breath -No evidence of ACS so far, and chest x-ray and CT angiogram showed no pneumonia or acute PE. -This is might be systemic manifestation of her UTI. Will monitor closely.  PMR -Patient is chronic prednisone, continue. -If patient develops hypertension, she might need stress dose of steroids.  Dementia  -Seems pretty mild continue preadmission Aricept.  HTN/CAD -No active issue  currently, negative cardiac enzymes and EKG. -Continue preadmission amlodipine, Plavix and statins.  History of PE -Continue Coumadin, INR is therapeutic.   LOS: 1 day   Paityn Balsam A 02/03/2012, 8:51 AM

## 2012-02-03 NOTE — Progress Notes (Signed)
ANTICOAGULATION CONSULT NOTE - Follow Up Consult  Pharmacy Consult for : Coumdin Indication: History of PE  Allergies  Allergen Reactions  . Amoxicillin     REACTION: unspecified  . Codeine Phosphate     REACTION: unspecified  . Promethazine Hcl     REACTION: unspecified  . Propoxacet-N     REACTION: unspecified  . Sulfamethoxazole     REACTION: unspecified    Patient Measurements: Height: 5\' 2"  (157.5 cm) Weight: 145 lb 4.5 oz (65.9 kg) IBW/kg (Calculated) : 50.1    Vital Signs: Temp: 97.6 F (36.4 C) (04/04 1436) Temp src: Oral (04/04 1436) BP: 142/73 mmHg (04/04 1436) Pulse Rate: 74  (04/04 1436)  Labs:  Basename 02/03/12 1242 02/03/12 0510 02/02/12 2130 02/02/12 1624 02/02/12 1623  HGB -- 10.4* -- -- 10.2*  HCT -- 32.7* -- -- 32.3*  PLT -- 303 -- -- 268  APTT -- -- -- -- 34  LABPROT 21.6* -- -- -- 27.0*  INR 1.84* -- -- -- 2.45*  HEPARINUNFRC -- -- -- -- --  CREATININE -- 0.68 -- -- 0.76  CKTOTAL -- -- -- 127 --  CKMB -- -- -- 1.4 --  TROPONINI -- -- <0.30 <0.30 --   Estimated Creatinine Clearance: 43.3 ml/min (by C-G formula based on Cr of 0.68).  Medications:     sodium chloride  Intravenous Once  albuterol 5 mg Nebulization Once  amLODipine 10 mg Oral Daily  atorvastatin 20 mg Oral q1800  ciprofloxacin 200 mg Intravenous Q12H  clopidogrel 75 mg Oral Q breakfast  docusate sodium 100 mg Oral BID  donepezil 5 mg Oral q morning - 10a  levofloxacin (LEVAQUIN) IV 500 mg Intravenous To Major  methylPREDNISolone (SOLU-MEDROL) injection 125 mg Intravenous Once  predniSONE 2 mg Oral Daily  rOPINIRole 1 mg Oral QHS  senna 1 tablet Oral BID  Warfarin - Pharmacist Dosing Inpatient  Does not apply q1800  DISCONTD: amLODipine 10 mg Oral Daily  DISCONTD: aspirin 325 mg Oral Daily  DISCONTD: levofloxacin (LEVAQUIN) IV 500 mg Intravenous Q24H  DISCONTD: simvastatin 40 mg Oral Daily    Assessment:  78 yo female with history of PE to resume Coumadin  INR  SUBtherapeutic after no Coumadin yesterday.  Reported that MD told patient to not take Coumadin yesterday "because the INR was ~ 5."  Home dose reported as Coumadin 2.5 mg daily.  Goal of Therapy:   INR 2-3   Plan:   Coumadin 5 mg today.  Daily PT/INR.  Praneel Haisley, Elisha Headland, Pharm.D. 02/03/2012 4:04 PM

## 2012-02-03 NOTE — Progress Notes (Signed)
Utilization review completed. [name] [name] 02/03/2012 

## 2012-02-03 NOTE — Progress Notes (Signed)
   CARE MANAGEMENT NOTE 02/03/2012  Patient:  [name] [name], [name] [name]   Account Number:  [id number]  Date Initiated:  02/03/2012  Documentation initiated by:  Letha Cape  Subjective/Objective Assessment:   dx uti,  admit- lives with son.     Action/Plan:   pt eval- no pt follow up.   Anticipated DC Date:  02/06/2012   Anticipated DC Plan:  HOME/SELF CARE      DC Planning Services  CM consult      Choice offered to / List presented to:             Status of service:  In process, will continue to follow Medicare Important Message given?   (If response is "NO", the following Medicare IM given date fields will be blank) Date Medicare IM given:   Date Additional Medicare IM given:    Discharge Disposition:    Per UR Regulation:    If discussed at Long Length of Stay Meetings, dates discussed:    Comments:  02/03/12 16:25 Letha Cape RN, BSN [phone] patient lives with son, per physical therapy patient has no pt needs, he has rolling walker, straight cane and shower chair at home.  Patient has medication coverage and transportation.

## 2012-02-03 NOTE — H&P (Signed)
PCP:  [name], MD, MD  Dr. Riley Kill, cardiology  Chief Complaint:  Dyspnea   HPI: [phone] with h/o CAD s/p PTCA, h/o DVT/PE, HTN, h/o breast ca s/p lumpectomy and radiation, and PMR  presents with dyspnea on exertion, fatigue and found to have WBC 13, UTI.   Pt last seen in ED 08/2011 with fatigue and lightheadedness, found to have UTI and discharged on  ABx.   Pt is reliable historian, relates that she was in usual state of health until Tuesday  night/Wednesday when she noted that she got very SOB and fatigued while walking to the bathroom  from her bedroom which is quite short a walk and usually not fatiguing to her. Dyspnea was mostly  with exertion and not at rest, and no associated chest pain at all. She relates an episode of  feeling very sweaty but no outright fevers, chills, rigors. She had an episode of nausea over the  past day but no vomiting, no other GI issues of diarrhea, abd pain. She denies any dysuria or any  other urinary issue.   In the ED vitals were stable. Labs showed hypoNa 133 otherwise chem, LFT's normal. Cardiac enzymes  negative x2. WBC elevated to 13.4 with 81% neutros, Hgb stable. INR 2.4. UA with positive  nitrite/trace LE/3-6 WBC/many bacteria, rare squamous. UCx pending. CXR showed chronically  elevated R hemidiaphragm, c'megaly, nothing acute. Followed by CTA chest that was negative for  acute. Pt was given 125 solumedrol, 500 IV levofloxacin, albuterol.   ROS as above, otherwise basically negative.   Past Medical History  Diagnosis Date  . CAD (coronary artery disease)     S/p PTCA / stenting, unclear when/where  . 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 Started coumadin  . Breast CA 2005-2006    S/p lumpectomy and radiation  . HTN (hypertension)   . Restless leg syndrome   . PMR (polymyalgia rheumatica)     No past surgical history on file.  Medications:  HOME MEDS: Pt not able to reconcile meds. She is able  to say she's on coumadin though, which is not on this list.  Prior to Admission medications   Medication Sig Start Date End Date Taking? Authorizing Provider  amLODipine (NORVASC) 10 MG tablet Take 10 mg by mouth daily.   Yes Historical Provider, MD  atorvastatin (LIPITOR) 20 MG tablet Take 10 mg by mouth daily.   Yes Historical Provider, MD  clopidogrel (PLAVIX) 75 MG tablet Take 75 mg by mouth daily.   Yes Historical Provider, MD  donepezil (ARICEPT) 5 MG tablet Take 5 mg by mouth every morning.   Yes Historical Provider, MD  LORazepam (ATIVAN) 0.5 MG tablet Take 0.25 mg by mouth every 8 (eight) hours. For anxiety   Yes Historical Provider, MD  potassium chloride SA (K-DUR,KLOR-CON) 20 MEQ tablet Take 20 mEq by mouth daily.   Yes Historical Provider, MD  predniSONE (DELTASONE) 1 MG tablet Take 2 mg by mouth daily.   Yes Historical Provider, MD  rOPINIRole (REQUIP) 1 MG tablet Take 1 mg by mouth at bedtime.   Yes Historical Provider, MD  zaleplon (SONATA) 10 MG capsule Take 10 mg by mouth at bedtime.   Yes Historical Provider, MD  nitrofurantoin (MACRODANTIN) 100 MG capsule Take 100 mg by mouth 2 (two) times daily. For seven days starting 01/21/12    Historical Provider, MD    Allergies:  Allergies  Allergen Reactions  . Amoxicillin     REACTION:  unspecified  . Codeine Phosphate     REACTION: unspecified  . Promethazine Hcl     REACTION: unspecified  . Propoxacet-N     REACTION: unspecified  . Sulfamethoxazole     REACTION: unspecified    Social History:   does not have a smoking history on file. She does not have any smokeless tobacco history on file. Her alcohol and drug histories not on file. She lives at home with her son and is still ambulatory daily with a cane or walker. She denies any history of smoking.   Family History: No family history on file.  Physical Exam: Filed Vitals:   02/02/12 1813 02/02/12 2314 02/02/12 2345 02/03/12 0040  BP: 167/62 164/68 166/67 159/73    Pulse: 69 62 65 64  Temp: 99 F (37.2 C) 98.6 F (37 C)  98.1 F (36.7 C)  TempSrc: Oral Oral  Oral  Resp: 26 20 22 14   SpO2: 100% 98% 99% 98%   Blood pressure 159/73, pulse 64, temperature 98.1 F (36.7 C), temperature source Oral, resp. rate 14, SpO2 98.00%. Gen: Elderly F, not frail appearing, laying in ED stretcher sleeping and awakens to voice easily,  is alert, attentive, and able to relate history quite well. No distress, breathing comfortably, no  icnreased WOB or accessory muscle use.  HEENT: Pupils round and minimally reactive, constricted ~1-62mm, EOMI, sclera clear. Mouth moist  and overall normal appearing, some missing teeth and some poor dentition.  Lungs: CTAB no w/c/r, good air movement, overall this is a normal exam Heart: Regular, not tachycardic, very slight systolic murmur at BUSB's, very slight Abd: Minimally distended, but soft, not tender, not rigid, not peritoneal, no facial grimacing or  subjective pain to palption even over her bladder Extrem: warm, perfusing well, good bulk/tone, normal exam. No BLE edema noted. Radials palpable Neuro: Alert, attentive, covnersant, approparite. Cn 2-12 intact, no slurred speech or drooping.  moves extremities, can sit up in the bed on her own. Grossly non-focal   Labs & Imaging Results for orders placed during the hospital encounter of 02/02/12 (from the past 48 hour(s))  CBC     Status: Abnormal   Collection Time   02/02/12  4:23 PM      Component Value Range Comment   WBC 13.4 (*) 4.0 - 10.5 (K/uL)    RBC 4.21  3.87 - 5.11 (MIL/uL)    Hemoglobin 10.2 (*) 12.0 - 15.0 (g/dL)    HCT 40.9 (*) 81.1 - 46.0 (%)    MCV 76.7 (*) 78.0 - 100.0 (fL)    MCH 24.2 (*) 26.0 - 34.0 (pg)    MCHC 31.6  30.0 - 36.0 (g/dL)    RDW 91.4 (*) 78.2 - 15.5 (%)    Platelets 268  150 - 400 (K/uL)   DIFFERENTIAL     Status: Abnormal   Collection Time   02/02/12  4:23 PM      Component Value Range Comment   Neutrophils Relative 81 (*) 43 - 77  (%)    Neutro Abs 10.9 (*) 1.7 - 7.7 (K/uL)    Lymphocytes Relative 11 (*) 12 - 46 (%)    Lymphs Abs 1.5  0.7 - 4.0 (K/uL)    Monocytes Relative 7  3 - 12 (%)    Monocytes Absolute 0.9  0.1 - 1.0 (K/uL)    Eosinophils Relative 0  0 - 5 (%)    Eosinophils Absolute 0.1  0.0 - 0.7 (K/uL)    Basophils Relative 0  0 - 1 (%)    Basophils Absolute 0.0  0.0 - 0.1 (K/uL)   COMPREHENSIVE METABOLIC PANEL     Status: Abnormal   Collection Time   02/02/12  4:23 PM      Component Value Range Comment   Sodium 133 (*) 135 - 145 (mEq/L)    Potassium 4.2  3.5 - 5.1 (mEq/L)    Chloride 100  96 - 112 (mEq/L)    CO2 23  19 - 32 (mEq/L)    Glucose, Bld 91  70 - 99 (mg/dL)    BUN 13  6 - 23 (mg/dL)    Creatinine, Ser 1.61  0.50 - 1.10 (mg/dL)    Calcium 9.1  8.4 - 10.5 (mg/dL)    Total Protein 6.3  6.0 - 8.3 (g/dL)    Albumin 3.5  3.5 - 5.2 (g/dL)    AST 13  0 - 37 (U/L)    ALT 9  0 - 35 (U/L)    Alkaline Phosphatase 49  39 - 117 (U/L)    Total Bilirubin 0.2 (*) 0.3 - 1.2 (mg/dL)    GFR calc non Af Amer 73 (*) >90 (mL/min)    GFR calc Af Amer 85 (*) >90 (mL/min)   PROTIME-INR     Status: Abnormal   Collection Time   02/02/12  4:23 PM      Component Value Range Comment   Prothrombin Time 27.0 (*) 11.6 - 15.2 (seconds)    INR 2.45 (*) 0.00 - 1.49    APTT     Status: Normal   Collection Time   02/02/12  4:23 PM      Component Value Range Comment   aPTT 34  24 - 37 (seconds)   CARDIAC PANEL(CRET KIN+CKTOT+MB+TROPI)     Status: Normal   Collection Time   02/02/12  4:24 PM      Component Value Range Comment   Total CK 127  7 - 177 (U/L)    CK, MB 1.4  0.3 - 4.0 (ng/mL)    Troponin I <0.30  <0.30 (ng/mL)    Relative Index 1.1  0.0 - 2.5    URINALYSIS, ROUTINE W REFLEX MICROSCOPIC     Status: Abnormal   Collection Time   02/02/12  5:48 PM      Component Value Range Comment   Color, Urine YELLOW  YELLOW     APPearance HAZY (*) CLEAR     Specific Gravity, Urine 1.012  1.005 - 1.030     pH 6.0  5.0 - 8.0      Glucose, UA NEGATIVE  NEGATIVE (mg/dL)    Hgb urine dipstick MODERATE (*) NEGATIVE     Bilirubin Urine NEGATIVE  NEGATIVE     Ketones, ur NEGATIVE  NEGATIVE (mg/dL)    Protein, ur NEGATIVE  NEGATIVE (mg/dL)    Urobilinogen, UA 0.2  0.0 - 1.0 (mg/dL)    Nitrite POSITIVE (*) NEGATIVE     Leukocytes, UA TRACE (*) NEGATIVE    URINE MICROSCOPIC-ADD ON     Status: Abnormal   Collection Time   02/02/12  5:48 PM      Component Value Range Comment   Squamous Epithelial / LPF RARE  RARE     WBC, UA 3-6  <3 (WBC/hpf)    RBC / HPF 3-6  <3 (RBC/hpf)    Bacteria, UA MANY (*) RARE     Urine-Other RARE YEAST     TROPONIN I     Status: Normal  Collection Time   02/02/12  9:30 PM      Component Value Range Comment   Troponin I <0.30  <0.30 (ng/mL)    Dg Chest 2 View  02/02/2012  *RADIOLOGY REPORT*  Clinical Data: Mid chest pain radiating to left shoulder left arm, shortness of breath, nausea, history coronary artery disease post stenting  CHEST - 2 VIEW  Comparison: 08/28/2011  Findings: Enlargement of cardiac silhouette. Calcified tortuous aorta. Pulmonary vascularity normal. Chronic elevation of right diaphragm. Minimal right basilar atelectasis. Remaining lungs clear. No pleural effusion or pneumothorax. Bones appear demineralized with chronic compression deformities of mid lower thoracic vertebrae.  IMPRESSION: Enlargement of cardiac silhouette. Chronic elevation of right diaphragm with right basilar atelectasis. No acute abnormalities. Chronic thoracic spine compression deformities.  Original Report Authenticated By: Lollie Marrow, M.D.   Ct Angio Chest W/cm &/or Wo Cm  02/02/2012  *RADIOLOGY REPORT*  Clinical Data: Chest pain, shortness of breath, nausea.  CT ANGIOGRAPHY CHEST  Technique:  Multidetector CT imaging of the chest using the standard protocol during bolus administration of intravenous contrast. Multiplanar reconstructed images including MIPs were obtained and reviewed to evaluate the  vascular anatomy.  Contrast:  100 ml Omnipaque 300  Comparison: 11/18/2008  Findings: Technically adequate study with good opacification of the central and segmental pulmonary arteries.  No focal filling defects.  No evidence of significant pulmonary embolus.  Normal caliber thoracic aorta with mild calcification.  Calcification of the coronary arteries.  Visualized portions of the upper abdominal organs are unremarkable.  No pleural effusions.  No significant lymphadenopathy in the chest.  The esophagus is decompressed. Visualization of lung fields is limited due to respiratory motion artifact but there is no evidence of significant consolidation or interstitial disease.  Mild dependent atelectasis in the bases. The airways appear patent.  No pneumothorax.  Degenerative changes in the thoracic spine.  Compression fractures of T9, representing about 50% loss of height and of T12, representing about 25% loss of height.  These are stable since the previous study.  IMPRESSION: No evidence of significant pulmonary embolus.  Original Report Authenticated By: Marlon Pel, M.D.    ECG: NSR 60 bpm, normal axis, normal P and PR, narrow QRS, no Q waves, no ST deviations, normal T  waves. This is a completely normal ECG.     Impression Present on Admission:  .Dyspnea .UTI (urinary tract infection) .CAD (coronary artery disease) .HTN (hypertension) .VTE (venous thromboembolism)  89yoF with h/o CAD s/p PTCA, h/o DVT/PE, HTN, h/o breast ca s/p lumpectomy and radiation, and PMR  presents with dyspnea on exertion, fatigue and found to have WBC 13, UTI.   1. Leukocytosis, UTI: Pt doesn't appear that ill, and hemodynamics are stable. Prior UCx is not  helpful.  - Empiric Cipro for now, follow up culture. Maintenance IVF's.  - Unclear why pt was taking nitrofurantoin (recent Dx UTI? Didn't mention this to me). Holding.   2. SOB: This is not impressive to me and I suspect a non-specific manifestation of  systemic  illness/UTI. CTA negative, ECG very non-ischemic, lungs clear, and asymptomatic at present. Just  monitor for now, will not treat for COPD  3. CAD: Not active issue. Continue home amlodipine, plavix, statin 4. H/o PMR: continue home prednisone 5. Dementia: continue home aricept  6. RLS: continue home requip 7. Needs medication reconciliation, coumadin is not listed. Pt not really able to reconcile that  well.  8. History of DVT / PE: have ordered for continued coumadin  per pharmacy, but need to find dose.   Regular, MC team 3 Full code, discussed with pt   Other plans as per orders.  Niyonna Betsill 02/03/2012, 12:52 AM

## 2012-02-03 NOTE — Progress Notes (Signed)
ANTICOAGULATION CONSULT NOTE - Initial Consult  Pharmacy Consult for Coumadin Indication: h/o PE  Allergies  Allergen Reactions  . Amoxicillin     REACTION: unspecified  . Codeine Phosphate     REACTION: unspecified  . Promethazine Hcl     REACTION: unspecified  . Propoxacet-N     REACTION: unspecified  . Sulfamethoxazole     REACTION: unspecified   Vital Signs: Temp: 98.1 F (36.7 C) (04/04 0040) Temp src: Oral (04/04 0040) BP: 159/73 mmHg (04/04 0040) Pulse Rate: 64  (04/04 0040)  Labs:  Basename 02/02/12 2130 02/02/12 1624 02/02/12 1623  HGB -- -- 10.2*  HCT -- -- 32.3*  PLT -- -- 268  APTT -- -- 34  LABPROT -- -- 27.0*  INR -- -- 2.45*  HEPARINUNFRC -- -- --  CREATININE -- -- 0.76  CKTOTAL -- 127 --  CKMB -- 1.4 --  TROPONINI <0.30 <0.30 --   The CrCl is unknown because both a height and weight (above a minimum accepted value) are required for this calculation.  Medical History: Past Medical History  Diagnosis Date  . CAD (coronary artery disease)     S/p PTCA / stenting, unclear when/where  . 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 Started coumadin  . Breast CA 2005-2006    S/p lumpectomy and radiation  . HTN (hypertension)   . Restless leg syndrome   . PMR (polymyalgia rheumatica)     Medications:  Prescriptions prior to admission  Medication Sig Dispense Refill  . amLODipine (NORVASC) 10 MG tablet Take 10 mg by mouth daily.      Marland Kitchen atorvastatin (LIPITOR) 20 MG tablet Take 10 mg by mouth daily.      . clopidogrel (PLAVIX) 75 MG tablet Take 75 mg by mouth daily.      Marland Kitchen donepezil (ARICEPT) 5 MG tablet Take 5 mg by mouth every morning.      Marland Kitchen LORazepam (ATIVAN) 0.5 MG tablet Take 0.25 mg by mouth every 8 (eight) hours. For anxiety      . potassium chloride SA (K-DUR,KLOR-CON) 20 MEQ tablet Take 20 mEq by mouth daily.      . predniSONE (DELTASONE) 1 MG tablet Take 2 mg by mouth daily.      Marland Kitchen rOPINIRole (REQUIP) 1 MG tablet Take 1 mg by  mouth at bedtime.      . zaleplon (SONATA) 10 MG capsule Take 10 mg by mouth at bedtime.      . nitrofurantoin (MACRODANTIN) 100 MG capsule Take 100 mg by mouth 2 (two) times daily. For seven days starting 01/21/12        Assessment: 78 yo female with history of PE to resume Coumadin.  Current regimen unknown.  Goal of Therapy:  INR 2-3   Plan:  F/U daily INR and home regimen in am.  Eddie Candle 02/03/2012,12:59 AM

## 2012-02-03 NOTE — ED Notes (Signed)
Admitting MD at bedside, pt awaiting inpt beds assignment.  

## 2012-02-04 LAB — URINE CULTURE

## 2012-02-04 LAB — BASIC METABOLIC PANEL
Chloride: 104 mEq/L (ref 96–112)
GFR calc Af Amer: 86 mL/min — ABNORMAL LOW (ref >90)
GFR calc non Af Amer: 74 mL/min — ABNORMAL LOW (ref >90)
Potassium: 3.6 mEq/L (ref 3.5–5.1)
Sodium: 137 mEq/L (ref 135–145)

## 2012-02-04 LAB — PROTIME-INR: Prothrombin Time: 22.2 seconds — ABNORMAL HIGH (ref 11.6–15.2)

## 2012-02-04 MED ORDER — CIPROFLOXACIN HCL 500 MG PO TABS: 500.0000 mg | ORAL_TABLET | Freq: Two times a day (BID) | ORAL | Status: AC

## 2012-02-04 MED ORDER — HYDRALAZINE HCL 20 MG/ML IJ SOLN
10.0000 mg | Freq: Four times a day (QID) | INTRAMUSCULAR | Status: DC | PRN
  Administered 2012-02-04: 10 mg via INTRAVENOUS
  Filled 2012-02-04: qty 0.5

## 2012-02-04 MED ORDER — LORAZEPAM 0.5 MG PO TABS
0.2500 mg | ORAL_TABLET | Freq: Three times a day (TID) | ORAL | Status: DC
  Administered 2012-02-04 (×2): 0.25 mg via ORAL
  Filled 2012-02-04 (×2): qty 1

## 2012-02-04 NOTE — Progress Notes (Signed)
Pt discharged today with son. IV site removed, discharge instructions reviewed and script for cipro given to patient. [name] [name]

## 2012-02-04 NOTE — Discharge Summary (Addendum)
HOSPITAL DISCHARGE SUMMARY  [name] [name]  MRN: [id number]  DOB:10-23-1921  Date of Admission: 02/02/2012 Date of Discharge: 02/04/2012         LOS: 2 days   Attending Physician:  Clydia Llano A  Patient's PCP:  [name], MD, MD  Consults: None  Discharge Diagnosis: UTI  Present on Admission:  .Dyspnea .UTI (urinary tract infection) .CAD (coronary artery disease) .HTN (hypertension) .VTE (venous thromboembolism)   Medication List  As of 02/04/2012 11:20 AM   STOP taking these medications         nitrofurantoin 100 MG capsule         TAKE these medications         amLODipine 10 MG tablet   Commonly known as: NORVASC   Take 10 mg by mouth daily.      atorvastatin 20 MG tablet   Commonly known as: LIPITOR   Take 20 mg by mouth daily.      ciprofloxacin 500 MG tablet   Commonly known as: CIPRO   Take 1 tablet (500 mg total) by mouth 2 (two) times daily.      clopidogrel 75 MG tablet   Commonly known as: PLAVIX   Take 75 mg by mouth daily.      donepezil 5 MG tablet   Commonly known as: ARICEPT   Take 5 mg by mouth every morning.      LORazepam 0.5 MG tablet   Commonly known as: ATIVAN   Take 0.25 mg by mouth every 8 (eight) hours. For anxiety      potassium chloride SA 20 MEQ tablet   Commonly known as: K-DUR,KLOR-CON   Take 20 mEq by mouth daily.      predniSONE 1 MG tablet   Commonly known as: DELTASONE   Take 2 mg by mouth daily.      rOPINIRole 1 MG tablet   Commonly known as: REQUIP   Take 1 mg by mouth at bedtime.      warfarin 2.5 MG tablet   Commonly known as: COUMADIN   Take 2.5 mg by mouth daily.      zaleplon 10 MG capsule   Commonly known as: SONATA   Take 10 mg by mouth at bedtime.             Brief Admission History: 89yoF with h/o CAD s/p PTCA, h/o DVT/PE, HTN, h/o breast ca s/p lumpectomy and radiation, and PMR  presents with dyspnea on exertion, fatigue and found to have WBC 13, UTI.  Pt last seen in ED  08/2011 with fatigue and lightheadedness, found to have UTI and discharged on  ABx.  Pt is reliable historian, relates that she was in usual state of health until Tuesday  night/Wednesday when she noted that she got very SOB and fatigued while walking to the bathroom  from her bedroom which is quite short a walk and usually not fatiguing to her. Dyspnea was mostly  with exertion and not at rest, and no associated chest pain at all. She relates an episode of  feeling very sweaty but no outright fevers, chills, rigors. She had an episode of nausea over the  past day but no vomiting, no other GI issues of diarrhea, abd pain. She denies any dysuria or any  other urinary issue.  In the ED vitals were stable. Labs showed hypoNa 133 otherwise chem, LFT's normal. Cardiac enzymes  negative x2. WBC elevated to 13.4 with 81% neutros, Hgb stable. INR 2.4. UA with positive  nitrite/trace LE/3-6 WBC/many bacteria, rare squamous. UCx pending. CXR showed chronically  elevated R hemidiaphragm, c'megaly, nothing acute. Followed by CTA chest that was negative for  acute. Pt was given 125 solumedrol, 500 IV levofloxacin, albuterol.   Hospital Course: Present on Admission:  .Dyspnea .UTI (urinary tract infection) .CAD (coronary artery disease) .HTN (hypertension) .VTE (venous thromboembolism)  1. UTI: At the time of initial evaluation in the emergency department urinalysis was consistent with UA. Patient started on ciprofloxacin. And soon the culture grew Klebsiella pneumoniae which is susceptible to ciprofloxacin so patient to be discharged on oral Cipro for 5 more days.  2. Shortness of breath: This is might be secondary to systemic effect of her UTI. Patient mentioned that this is especially happens with exertion. In the hospital here patient workup was negative for acute coronary syndrome with 3 sets of cardiac enzymes and negative EKG. Chest x-ray was negative for fluid overload and pneumonia. Because of  patient history of DVT/PE and she came in with subtherapeutic INR, CT angiogram of the chest was ordered and showed no evidence of acute PE. Patient needs followup with primary care physician for further evaluation.  3. History of PE/DVT: Patient is on Coumadin, INR today is 1.9. Patient will continue Coumadin in the same dose. Patient sees her primary care physician in one week.  4. PMR: Patient is on prednisone, preadmission dose is continued throughout the hospital stay.  5. Dementia: Patient lives with her son who called her at home, patient evaluated by PT/OT and they recommended no followup.  6. Hypertension: Her blood pressure was in the high side in the hospital. Patient is on amlodipine 10 mg that was continued. Blood pressure medication were not changed during this hospitalization and had blood pressure , felt to be a reaction to stress and concurrent infection. Patient to followup with her primary care physician within one week.  Dayof Discharge BP 171/70  Pulse 79  Temp(Src) 97.5 F (36.4 C) (Oral)  Resp 20  Ht 5\' 2"  (1.575 m)  Wt 65.9 kg (145 lb 4.5 oz)  BMI 26.57 kg/m2  SpO2 100% Physical Exam: GEN: No acute distress, cooperative with exam PSYCH: alert and oriented x4; does not appear anxious does not appear depressed; affect is normal  HEENT: Mucous membranes pink and anicteric;  Mouth: without oral thrush or lesions Eyes: PERRLA; EOM intact;  Neck: no cervical lymphadenopathy nor thyromegaly or carotid bruit; no JVD;  CHEST WALL: No tenderness, symmetrical to breathing bilaterally CHEST: Normal respiration, clear to auscultation bilaterally  HEART: Regular rate and rhythm; no murmurs, rubs or gallops, S1 and S2 heard  BACK: No kyphosis or scoliosis; no CVA tenderness  ABDOMEN:  soft non-tender; no masses, no organomegaly, normal abdominal bowel sounds; no pannus; no intertriginous candida.  EXTREMITIES: No bone or joint deformity; no edema; no ulcerations.  PULSES: 2+  and symmetric, neurovascularity is intact SKIN: Normal hydration no rash or ulceration, no flushing or suspicious lesions  CNS: Cranial nerves 2-12 grossly intact no focal neurologic deficit, coordination is intact gait not tested    Results for orders placed during the hospital encounter of 02/02/12 (from the past 24 hour(s))  GLUCOSE, CAPILLARY     Status: Abnormal   Collection Time   02/03/12 12:19 PM      Component Value Range   Glucose-Capillary 103 (*) 70 - 99 (mg/dL)  PROTIME-INR     Status: Abnormal   Collection Time   02/03/12 12:42 PM      Component  Value Range   Prothrombin Time 21.6 (*) 11.6 - 15.2 (seconds)   INR 1.84 (*) 0.00 - 1.49   PROTIME-INR     Status: Abnormal   Collection Time   02/04/12  5:22 AM      Component Value Range   Prothrombin Time 22.2 (*) 11.6 - 15.2 (seconds)   INR 1.91 (*) 0.00 - 1.49   BASIC METABOLIC PANEL     Status: Abnormal   Collection Time   02/04/12  5:22 AM      Component Value Range   Sodium 137  135 - 145 (mEq/L)   Potassium 3.6  3.5 - 5.1 (mEq/L)   Chloride 104  96 - 112 (mEq/L)   CO2 22  19 - 32 (mEq/L)   Glucose, Bld 122 (*) 70 - 99 (mg/dL)   BUN 20  6 - 23 (mg/dL)   Creatinine, Ser 1.61  0.50 - 1.10 (mg/dL)   Calcium 8.7  8.4 - 09.6 (mg/dL)   GFR calc non Af Amer 74 (*) >90 (mL/min)   GFR calc Af Amer 86 (*) >90 (mL/min)  GLUCOSE, CAPILLARY     Status: Abnormal   Collection Time   02/04/12  8:11 AM      Component Value Range   Glucose-Capillary 110 (*) 70 - 99 (mg/dL)    Disposition: Home   Follow-up Appts: Discharge Orders    Future Orders Please Complete By Expires   Diet - low sodium heart healthy      Increase activity slowly         Follow-up Information    Follow up with [name], MD. Schedule an appointment as soon as possible for a visit in 1 week.   Contact information:   [address], [address] [facility] [place] [address] [phone]          I  spent 40 minutes completing paperwork and coordinating discharge efforts.  SignedClydia Llano A 02/04/2012, 11:20 AM

## 2012-02-04 NOTE — Progress Notes (Signed)
   CARE MANAGEMENT NOTE 02/04/2012  Patient:  [name] [name], [name] [name]   Account Number:  [id number]  Date Initiated:  02/03/2012  Documentation initiated by:  Letha Cape  Subjective/Objective Assessment:   dx uti,  admit- lives with son.     Action/Plan:   pt eval- no pt follow up.   Anticipated DC Date:  02/04/2012   Anticipated DC Plan:  HOME/SELF CARE      DC Planning Services  CM consult      Choice offered to / List presented to:             Status of service:  Completed, signed off Medicare Important Message given?   (If response is "NO", the following Medicare IM given date fields will be blank) Date Medicare IM given:   Date Additional Medicare IM given:    Discharge Disposition:  HOME/SELF CARE  Per UR Regulation:    If discussed at Long Length of Stay Meetings, dates discussed:    Comments:  02/04/12 14:36 Letha Cape RN, BSN [phone] patient for discharge today.  02/03/12 16:25 Letha Cape RN, BSN [phone] patient lives with son, per physical therapy patient has no pt needs, he has rolling walker, straight cane and shower chair at home.  Patient has medication coverage and transportation.

## 2012-03-02 ENCOUNTER — Observation Stay (HOSPITAL_COMMUNITY)
Admission: EM | Admit: 2012-03-02 | Discharge: 2012-03-05 | DRG: 303 | Disposition: A | Discharge status: Home / Self Care | Payer: Medicare Other | Attending: Cardiology | Admitting: Cardiology

## 2012-03-02 ENCOUNTER — Encounter (HOSPITAL_COMMUNITY): Payer: Self-pay | Admitting: Emergency Medicine

## 2012-03-02 ENCOUNTER — Emergency Department (HOSPITAL_COMMUNITY): Payer: Medicare Other

## 2012-03-02 ENCOUNTER — Ambulatory Visit: Payer: Medicare Other | Admitting: Cardiology

## 2012-03-02 DIAGNOSIS — I200 Unstable angina: Secondary | ICD-10-CM

## 2012-03-02 DIAGNOSIS — I8290 Acute embolism and thrombosis of unspecified vein: Secondary | ICD-10-CM | POA: Diagnosis present

## 2012-03-02 DIAGNOSIS — G2581 Restless legs syndrome: Secondary | ICD-10-CM | POA: Insufficient documentation

## 2012-03-02 DIAGNOSIS — D62 Acute posthemorrhagic anemia: Secondary | ICD-10-CM | POA: Diagnosis present

## 2012-03-02 DIAGNOSIS — I2510 Atherosclerotic heart disease of native coronary artery without angina pectoris: Principal | ICD-10-CM | POA: Insufficient documentation

## 2012-03-02 DIAGNOSIS — R0602 Shortness of breath: Secondary | ICD-10-CM | POA: Insufficient documentation

## 2012-03-02 DIAGNOSIS — D649 Anemia, unspecified: Secondary | ICD-10-CM

## 2012-03-02 DIAGNOSIS — Z9861 Coronary angioplasty status: Secondary | ICD-10-CM | POA: Insufficient documentation

## 2012-03-02 DIAGNOSIS — Z79899 Other long term (current) drug therapy: Secondary | ICD-10-CM | POA: Insufficient documentation

## 2012-03-02 DIAGNOSIS — M353 Polymyalgia rheumatica: Secondary | ICD-10-CM | POA: Insufficient documentation

## 2012-03-02 DIAGNOSIS — Z86718 Personal history of other venous thrombosis and embolism: Secondary | ICD-10-CM | POA: Insufficient documentation

## 2012-03-02 DIAGNOSIS — I10 Essential (primary) hypertension: Secondary | ICD-10-CM | POA: Diagnosis present

## 2012-03-02 DIAGNOSIS — Z7901 Long term (current) use of anticoagulants: Secondary | ICD-10-CM | POA: Insufficient documentation

## 2012-03-02 DIAGNOSIS — E7800 Pure hypercholesterolemia, unspecified: Secondary | ICD-10-CM | POA: Insufficient documentation

## 2012-03-02 DIAGNOSIS — Z853 Personal history of malignant neoplasm of breast: Secondary | ICD-10-CM | POA: Insufficient documentation

## 2012-03-02 HISTORY — DX: Anemia, unspecified: D64.9

## 2012-03-02 HISTORY — DX: Unstable angina: I20.0

## 2012-03-02 LAB — PRO B NATRIURETIC PEPTIDE: Pro B Natriuretic peptide (BNP): 105.4 pg/mL (ref 0–450)

## 2012-03-02 LAB — COMPREHENSIVE METABOLIC PANEL
ALT: 10 U/L (ref 0–35)
AST: 13 U/L (ref 0–37)
Albumin: 3.7 g/dL (ref 3.5–5.2)
Alkaline Phosphatase: 57 U/L (ref 39–117)
Chloride: 104 mEq/L (ref 96–112)
Potassium: 3.9 mEq/L (ref 3.5–5.1)
Sodium: 138 mEq/L (ref 135–145)
Total Protein: 6.6 g/dL (ref 6.0–8.3)

## 2012-03-02 LAB — URINALYSIS, ROUTINE W REFLEX MICROSCOPIC
Glucose, UA: NEGATIVE mg/dL
Protein, ur: NEGATIVE mg/dL
Specific Gravity, Urine: 1.009 (ref 1.005–1.030)
pH: 6 (ref 5.0–8.0)

## 2012-03-02 LAB — POCT I-STAT TROPONIN I
Troponin i, poc: 0 ng/mL (ref 0.00–0.08)
Troponin i, poc: 0.01 ng/mL (ref 0.00–0.08)
Troponin i, poc: 0 ng/mL (ref 0.00–0.08)

## 2012-03-02 LAB — CBC
Hemoglobin: 10 g/dL — ABNORMAL LOW (ref 12.0–15.0)
MCHC: 31 g/dL (ref 30.0–36.0)
RDW: 16.9 % — ABNORMAL HIGH (ref 11.5–15.5)
WBC: 8.7 10*3/uL (ref 4.0–10.5)

## 2012-03-02 LAB — URINE MICROSCOPIC-ADD ON

## 2012-03-02 LAB — CARDIAC PANEL(CRET KIN+CKTOT+MB+TROPI): CK, MB: 2 ng/mL (ref 0.3–4.0)

## 2012-03-02 MED ORDER — POTASSIUM CHLORIDE CRYS ER 20 MEQ PO TBCR
20.0000 meq | EXTENDED_RELEASE_TABLET | Freq: Every day | ORAL | Status: DC
  Administered 2012-03-03 – 2012-03-05 (×3): 20 meq via ORAL
  Filled 2012-03-02 (×4): qty 1

## 2012-03-02 MED ORDER — SODIUM CHLORIDE 0.9 % IJ SOLN
3.0000 mL | Freq: Two times a day (BID) | INTRAMUSCULAR | Status: DC
  Administered 2012-03-03 – 2012-03-05 (×5): 3 mL via INTRAVENOUS

## 2012-03-02 MED ORDER — AMLODIPINE BESYLATE 10 MG PO TABS
10.0000 mg | ORAL_TABLET | Freq: Every day | ORAL | Status: DC
  Administered 2012-03-02 – 2012-03-05 (×4): 10 mg via ORAL
  Filled 2012-03-02 (×4): qty 1

## 2012-03-02 MED ORDER — ATORVASTATIN CALCIUM 20 MG PO TABS
20.0000 mg | ORAL_TABLET | Freq: Every day | ORAL | Status: DC
  Administered 2012-03-02 – 2012-03-05 (×4): 20 mg via ORAL
  Filled 2012-03-02 (×4): qty 1

## 2012-03-02 MED ORDER — SODIUM CHLORIDE 0.9 % IV SOLN: 250.0000 mL | INTRAVENOUS | Status: DC | PRN

## 2012-03-02 MED ORDER — ASPIRIN 300 MG RE SUPP
300.0000 mg | RECTAL | Status: AC
  Filled 2012-03-02: qty 1

## 2012-03-02 MED ORDER — NITROGLYCERIN 0.4 MG SL SUBL: 0.4000 mg | SUBLINGUAL_TABLET | SUBLINGUAL | Status: DC | PRN

## 2012-03-02 MED ORDER — SIMVASTATIN 40 MG PO TABS: 40.0000 mg | ORAL_TABLET | Freq: Every day | ORAL | Status: DC

## 2012-03-02 MED ORDER — ZOLPIDEM TARTRATE 5 MG PO TABS
5.0000 mg | ORAL_TABLET | Freq: Every evening | ORAL | Status: DC | PRN
  Administered 2012-03-02 – 2012-03-04 (×3): 5 mg via ORAL
  Filled 2012-03-02 (×3): qty 1

## 2012-03-02 MED ORDER — ONDANSETRON HCL 4 MG/2ML IJ SOLN: 4.0000 mg | Freq: Four times a day (QID) | INTRAMUSCULAR | Status: DC | PRN

## 2012-03-02 MED ORDER — ADULT MULTIVITAMIN W/MINERALS CH
1.0000 | ORAL_TABLET | Freq: Every day | ORAL | Status: DC
  Administered 2012-03-03 – 2012-03-05 (×3): 1 via ORAL
  Filled 2012-03-02 (×3): qty 1

## 2012-03-02 MED ORDER — LORAZEPAM 0.5 MG PO TABS: 0.2500 mg | ORAL_TABLET | Freq: Three times a day (TID) | ORAL | Status: DC | PRN

## 2012-03-02 MED ORDER — ROPINIROLE HCL 1 MG PO TABS
1.0000 mg | ORAL_TABLET | Freq: Every day | ORAL | Status: DC
  Administered 2012-03-02 – 2012-03-04 (×3): 1 mg via ORAL
  Filled 2012-03-02 (×4): qty 1

## 2012-03-02 MED ORDER — SODIUM CHLORIDE 0.9 % IV SOLN
1000.0000 mL | INTRAVENOUS | Status: DC
  Administered 2012-03-02: 1000 mL via INTRAVENOUS

## 2012-03-02 MED ORDER — CLOPIDOGREL BISULFATE 75 MG PO TABS
75.0000 mg | ORAL_TABLET | Freq: Every day | ORAL | Status: DC
  Administered 2012-03-03 – 2012-03-05 (×3): 75 mg via ORAL
  Filled 2012-03-02 (×3): qty 1

## 2012-03-02 MED ORDER — DONEPEZIL HCL 5 MG PO TABS
5.0000 mg | ORAL_TABLET | Freq: Every day | ORAL | Status: DC
  Administered 2012-03-03 – 2012-03-05 (×3): 5 mg via ORAL
  Filled 2012-03-02 (×3): qty 1

## 2012-03-02 MED ORDER — VITAMIN D3 25 MCG (1000 UNIT) PO TABS
2000.0000 [IU] | ORAL_TABLET | Freq: Every day | ORAL | Status: DC
  Administered 2012-03-03 – 2012-03-05 (×3): 2000 [IU] via ORAL
  Filled 2012-03-02 (×3): qty 2

## 2012-03-02 MED ORDER — ASPIRIN 81 MG PO CHEW
324.0000 mg | CHEWABLE_TABLET | ORAL | Status: AC
  Administered 2012-03-02: 324 mg via ORAL
  Filled 2012-03-02: qty 4

## 2012-03-02 MED ORDER — ACETAMINOPHEN 325 MG PO TABS
650.0000 mg | ORAL_TABLET | ORAL | Status: DC | PRN
  Administered 2012-03-02: 325 mg via ORAL
  Administered 2012-03-03: 650 mg via ORAL
  Filled 2012-03-02: qty 2
  Filled 2012-03-02: qty 1

## 2012-03-02 MED ORDER — PREDNISONE 1 MG PO TABS
2.0000 mg | ORAL_TABLET | Freq: Every day | ORAL | Status: DC
  Administered 2012-03-02 – 2012-03-05 (×4): 2 mg via ORAL
  Filled 2012-03-02 (×4): qty 2

## 2012-03-02 MED ORDER — ALPRAZOLAM 0.25 MG PO TABS: 0.2500 mg | ORAL_TABLET | Freq: Two times a day (BID) | ORAL | Status: DC | PRN

## 2012-03-02 MED ORDER — HEPARIN (PORCINE) IN NACL 100-0.45 UNIT/ML-% IJ SOLN
750.0000 [IU]/h | INTRAMUSCULAR | Status: DC
  Administered 2012-03-02: 750 [IU]/h via INTRAVENOUS
  Filled 2012-03-02: qty 250

## 2012-03-02 MED ORDER — ZOLPIDEM TARTRATE 5 MG PO TABS: 5.0000 mg | ORAL_TABLET | Freq: Every evening | ORAL | Status: DC | PRN

## 2012-03-02 MED ORDER — SODIUM CHLORIDE 0.9 % IJ SOLN: 3.0000 mL | INTRAMUSCULAR | Status: DC | PRN

## 2012-03-02 NOTE — ED Notes (Signed)
Per EMS, pt with onset of SOB last PM.  States pt said episode resolved.  This morning, approx 0700,  SOB began again.  Pt thought would resolve, but didn't.  Pt called EMS.

## 2012-03-02 NOTE — H&P (Signed)
History and Physical  Patient ID: [name] [name] Patient ID: [name] [name] MRN: [id number], DOB/AGE: [birth year] 78 y.o. Date of Encounter: 03/02/2012  Primary Physician: [name], MD, MD Primary Cardiologist:   Chief Complaint: Chest pain  HPI: [name] [name] is an 78 year old female with an extensive cardiac history. [name] was awakened by substernal chest pain at 3 AM. It reached a 7 or 8/10. [name] also had some shortness of breath with it. [name] is not sure about diaphoresis but her son says [name] was pale. [name] denies nausea or vomiting. The pain was at the lower edge of her sternum and mid epigastric area. [name] said it felt like a tonic of weight sitting on her chest. [name] said it was her usual angina. [name] did not have any nitroglycerin at home and took no meds for it. When her symptoms did not resolve, [name] came to the emergency room. In the emergency room, [name] is currently pain-free. [name] does not remember any recent episodes of chest pain. Her son is with her and he states that her activity level has significantly decreased over the last few weeks to months. He says [name] gets dyspnea on exertion with minimal activity. [name] is no longer doing any housework at all. Whenever [name] walks room to room, [name] has to sit down and rest afterwards. Currently in the emergency room on oxygen, [name] is resting comfortably.   Past Medical History  Diagnosis Date  . CAD (coronary artery disease)     S/p PTCA / stenting, unclear when/where, her son, 8 stents total, last one in 2004   . 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 Started coumadin  . Breast CA 2005-2006    S/p lumpectomy and radiation  . HTN (hypertension)   . Restless leg syndrome   . PMR (polymyalgia rheumatica)      Surgical History:  Past Surgical History  Procedure Date  . Cardiac catheterizations  Last one in 2004  . Colon surgery   . Breast surgery      I have reviewed the patient's current medications. Scheduled Meds:    Continuous Infusions:   . sodium chloride 1,000 mL (03/02/12 1540)   PRN Meds:.nitroGLYCERIN  Allergies:  Allergies  Allergen Reactions  . Amoxicillin     REACTION: unspecified  . Codeine Phosphate     REACTION: unspecified  . Promethazine Hcl     REACTION: unspecified  . Propoxyphene-Acetaminophen     REACTION: unspecified  . Sulfamethoxazole     REACTION: unspecified   Current Outpatient Prescriptions on File Prior to Encounter  Medication Sig Dispense Refill  . CALCIUM PO Take 1 tablet by mouth daily.      Marland Kitchen LORazepam (ATIVAN) 0.5 MG tablet Take 0.25 mg by mouth every 8 (eight) hours. For anxiety      . rOPINIRole (REQUIP) 1 MG tablet Take 1 mg by mouth at bedtime.      . zaleplon (SONATA) 10 MG capsule Take 10 mg by mouth at bedtime.      Marland Kitchen amLODipine (NORVASC) 10 MG tablet Take 10 mg by mouth daily.      Marland Kitchen atorvastatin (LIPITOR) 20 MG tablet Take 20 mg by mouth daily.      . clopidogrel (PLAVIX) 75 MG tablet Take 75 mg by mouth daily.      Marland Kitchen donepezil (ARICEPT) 5 MG tablet Take 5 mg by mouth every morning.      . potassium chloride SA (K-DUR,KLOR-CON) 20 MEQ tablet Take  20 mEq by mouth daily.      . predniSONE (DELTASONE) 1 MG tablet Take 2 mg by mouth daily.        History   Social History  . Marital Status: Widowed    Spouse Name: N/A    Number of Children: N/A  . Years of Education: N/A   Occupational History  .  retired    Social History Main Topics  . Smoking status: Never Smoker   . Smokeless tobacco: Never Used  . Alcohol Use:   . Drug Use:   . Sexually Active:    Other Topics Concern  . Not on file   Social History Narrative   [name] lives at home with her son and is still ambulatory daily with a cane or walker. [name] denies any history of smoking.      Family history: Both parents are deceased. Mother died at 86 from a myocardial infarction. Father  died at 10 from a stroke, possible history of PE. [name] [name], one sister  with bypass and a brother with bypass, also has a brother who died abruptly of a pulmonary embolism.   Review of Systems: [name] has significant dyspnea on exertion but no shortness of breath at rest except during the chest pain. [name] denies orthopnea, PND, or lower extremity edema. [name] does not remember any recent chest pain with exertion. [name] denies any reflux symptoms. [name] had a very small amount of bright red blood per rectum with her last bowel movement but states [name] has a history of hemorrhoids. [name] had a recent UTI that was treated but recurrent. After the last round of antibiotics, [name] had a yeast infection which was also treated. These issues finally resolved a week or more ago.  Full 14-point review of systems otherwise negative except as noted above.  Physical Exam: Blood pressure 172/62, pulse 63, temperature 98.6 F (37 C), temperature source Oral, resp. rate 16, height 5' 1.5" (1.562 m), weight 147 lb (66.679 kg), SpO2 100.00%. General: Well developed, well nourished, elderly female in no acute distress. Head: Normocephalic, atraumatic, sclera non-icteric, no xanthomas, nares are without discharge. Dentition: Poor  Neck: No carotid bruits. JVD not elevated. No thyromegally Lungs: Good expansion bilaterally. without wheezes or rhonchi. Few Rales bases Heart: Regular rate and rhythm with S1 S2.  No S3 or S4.  No murmur, no rubs, or gallops appreciated. Abdomen: Soft, non-tender, non-distended with normoactive bowel sounds. No hepatomegaly. No rebound/guarding. No obvious abdominal masses. Msk:  Strength and tone appear normal weak for age. No joint deformities or effusions, no spine or costo-vertebral angle tenderness. Extremities: No clubbing or cyanosis. No edema.  Distal pedal pulses are 2+ in 4 extrem Neuro: Alert and oriented X 3. Moves all extremities spontaneously. No focal deficits noted. Psych:  Responds to questions appropriately with a normal affect. Skin: No rashes or lesions  noted  Labs:   Lab Results  Component Value Date   WBC 8.7 03/02/2012   HGB 10.0* 03/02/2012   HCT 32.3* 03/02/2012   MCV 76.9* 03/02/2012   PLT 267 03/02/2012    Basename 03/02/12 1422  INR 1.97*    Lab 03/02/12 1422  NA 138  K 3.9  CL 104  CO2 23  BUN 13  CREATININE 0.78  CALCIUM 9.4  PROT 6.6  BILITOT 0.3  ALKPHOS 57  ALT 10  AST 13  GLUCOSE 96    Basename 03/02/12 1419  TROPIPOC 0.00    Pro B  Natriuretic peptide (BNP)  Date/Time Value Range Status  03/02/2012  2:23 PM 105.4  0-450 (pg/mL) Final  10/07/2010  5:46 PM <30.0  0.0-100.0 (pg/mL) Final     Radiology/Studies:  Dg Chest Portable 1 View 03/02/2012  *RADIOLOGY REPORT*  Clinical Data: Short of breath.  Chest discomfort.  Coronary artery disease.  PORTABLE CHEST - 1 VIEW  Comparison: 02/02/2012  Findings: Chronic elevation of right hemidiaphragm is stable.  Both lungs are clear.  No evidence of pleural effusion.  Heart size mediastinal contours are within normal limits.  IMPRESSION: No active disease.  Original Report Authenticated By: Danae Orleans, M.D.     Cardiac Cath: 02/19/2003 RESULTS:  1. The left main coronary artery: The left main coronary artery was free of  significant disease.  2. Left anterior descending: The left anterior descending artery gave rise  to a septal perforator and 3 diagonal branches. There was zero percent  stenosis at the stent in the ostium and zero percent stenosis at the  stent in the mid vessel.  3. Circumflex artery: The circumflex artery gave rise to a marginal branch  and a posterolateral branch. This vessel was free of significant disease.  4. Right coronary artery: The right coronary is a moderately large vessel  that had a 99% stenosis at the ostium, and there was TIMI-1 flow  distally. There was also an 80% narrowing in the distal portion of the 4  overlapping stents. There were some collaterals to the posterior  stenting and posterolateral branch from the left coronary  system.  LEFT VENTRICULOGRAM: The left ventriculogram was performed in the RAO  projection and showed good wall motion with no area of hypokinesis. The  estimated ejection fraction was 60%. INTERVENTION: Following rotational atherectomy, cutting balloon  angioplasty, and stenting of the ostial lesion, this improved from 99% to  20%, and the flow improved from TIMI-1 to TIMI-3 flow. Despite very high  pressures with a noncompliant balloon, despite the use of rotational  atherectomy and cutting balloon angioplasty, it was difficult to expand this  area further due to 2 overlapping stents and residual scar tissue within the  stents. The lesion in the mid vessel improved from 80% to 10% with cutting  balloon angioplasty and stenting.  Echo: 10/06/2010 Study Conclusions - Left ventricle: The cavity size was normal. Wall thickness was normal. Systolic function was normal. The estimated ejection fraction was in the range of 60% to 65%. Wall motion was normal; there were no regional wall motion abnormalities. Doppler parameters are consistent with abnormal left ventricular relaxation (grade 1 diastolic dysfunction). - Aortic valve: Trivial regurgitation. - Left atrium: The atrium was mildly dilated. Impressions: - The right ventricular systolic pressure was increased consistent with mild pulmonary hypertension.  ECG: 02-Mar-2012 13:09:34 SINUS RHYTHM ~ normal P axis, V-rate 50- 99 Vent. rate 57 BPM PR interval 152 ms QRS duration 78 ms QT/QTc 436/424 ms P-R-T axes 48 58 64  ASSESSMENT AND PLAN:  Principal Problem:  *Angina pectoris, unstable - admit, cycle cardiac enzymes, add heparin since INR less than 2.0, and IV nitroglycerin if chest pain recurs. Consider cardiac catheterization for further evaluation if [name] rules in for an MI, otherwise per M.D.  Active Problems:  HYPERCHOLESTEROLEMIA  IIA - check a lipid profile and continue Lipitor  HYPERTENSION, BENIGN  - continue home  medications History of DVT/PE - if cardiac cath as needed, hold Coumadin Anemia - her MCV is low, guaiac stools and check an iron profile History of  UTIs - her urine has many bacteria, check a urine culture, antibiotics per M.D.  Signed, Bjorn Loser Barrett PA-C 03/02/2012, 4:57 PM   History reviewed with the patient, no changes to be made. The patient has CAD as described.  [name] has had a slow functional decline.  [name] woke this am with chest pain similar to previous "heart pain".  However, this is very difficult for her to qualify or quantify.  This lasted all morning and went away on its own. Now pain free.  No objective evidence of ischemia. The patient exam reveals Lungs:  Clear, COR:  No rub,  Abd:  Positive bowel sounds, no rebound no guarding.  All available labs, radiology testing, previous records reviewed. Agree with documented assessment and plan.  I have discussed this with the patient and her sons.  [name] would want conservative therapy.   Fayrene Fearing [facility]  7:54 AM 03/02/12

## 2012-03-02 NOTE — ED Provider Notes (Addendum)
History     CSN: [id number]  Arrival date & time 03/02/12  1306   First MD Initiated Contact with Patient 03/02/12 1307      Chief Complaint  Patient presents with  . Shortness of Breath    (Consider location/radiation/quality/duration/timing/severity/associated sxs/prior treatment) HPI Comments: Patient is a 78 year old woman who said that this morning she couldn't breathe. She has hurting in her chest and in the pit of her stomach. She took no medicine for this there is been no fever she's had a slight cough. Review of her prior charts shows she has had prior coronary artery disease with cardiac stents. She's also had pulmonary embolism in December of 2011, 4 cheese on Coumadin. She has a history of breast cancer, status post thrombectomy and radiation. She also has polymyalgia rheumatica.  Patient is a 78 y.o. female presenting with shortness of breath. The history is provided by medical records. No language interpreter was used.  Shortness of Breath  The current episode started today. The problem occurs continuously. The problem has been unchanged. The problem is moderate. The symptoms are relieved by nothing. The symptoms are aggravated by nothing. Associated symptoms include chest pain, cough and shortness of breath. She has had no prior steroid use. She has had prior hospitalizations. Recently, medical care has been given by a specialist and at this facility. Services Performed: Prior treatment for shortness of breath and a UTI and afebrile 2013.    Past Medical History  Diagnosis Date  . CAD (coronary artery disease)     S/p PTCA / stenting, unclear when/where  . 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 Started coumadin  . Breast CA 2005-2006    S/p lumpectomy and radiation  . HTN (hypertension)   . Restless leg syndrome   . PMR (polymyalgia rheumatica)     Past Surgical History  Procedure Date  . Cardiac surgery   . Colon surgery   . Breast surgery     No  family history on file.  History  Substance Use Topics  . Smoking status: Never Smoker   . Smokeless tobacco: Never Used  . Alcohol Use:     OB History    Grav Para Term Preterm Abortions TAB SAB Ect Mult Living                  Review of Systems  Constitutional: Negative.   HENT: Negative.   Eyes: Negative.   Respiratory: Positive for cough and shortness of breath.   Cardiovascular: Positive for chest pain.  Gastrointestinal: Negative.   Genitourinary: Negative.   Musculoskeletal: Negative.   Neurological: Negative.   Psychiatric/Behavioral: Negative.     Allergies  Amoxicillin; Codeine phosphate; Promethazine hcl; Propoxyphene-acetaminophen; and Sulfamethoxazole  Home Medications   Current Outpatient Rx  Name Route Sig Dispense Refill  . AMLODIPINE BESYLATE 10 MG PO TABS Oral Take 10 mg by mouth daily.    . ATORVASTATIN CALCIUM 20 MG PO TABS Oral Take 20 mg by mouth daily.    Marland Kitchen CLOPIDOGREL BISULFATE 75 MG PO TABS Oral Take 75 mg by mouth daily.    . DONEPEZIL HCL 5 MG PO TABS Oral Take 5 mg by mouth every morning.    Marland Kitchen LORAZEPAM 0.5 MG PO TABS Oral Take 0.25 mg by mouth every 8 (eight) hours. For anxiety    . POTASSIUM CHLORIDE CRYS ER 20 MEQ PO TBCR Oral Take 20 mEq by mouth daily.    Marland Kitchen PREDNISONE 1 MG PO TABS  Oral Take 2 mg by mouth daily.    Marland Kitchen ROPINIROLE HCL 1 MG PO TABS Oral Take 1 mg by mouth at bedtime.    . WARFARIN SODIUM 2.5 MG PO TABS Oral Take 2.5 mg by mouth daily.    Marland Kitchen ZALEPLON 10 MG PO CAPS Oral Take 10 mg by mouth at bedtime.      BP 168/67  Pulse 56  Temp(Src) 98.6 F (37 C) (Oral)  Resp 20  SpO2 100%  Physical Exam  Nursing note and vitals reviewed. Constitutional: She is oriented to person, place, and time. She appears well-developed and well-nourished. No distress.  HENT:  Head: Normocephalic and atraumatic.  Right Ear: External ear normal.  Left Ear: External ear normal.  Mouth/Throat: Oropharynx is clear and moist.  Eyes:  Conjunctivae and EOM are normal. Pupils are equal, round, and reactive to light.  Neck: Normal range of motion. Neck supple.  Cardiovascular: Normal rate, regular rhythm and normal heart sounds.   Pulmonary/Chest: Effort normal and breath sounds normal.  Abdominal: Soft. Bowel sounds are normal.  Musculoskeletal: Normal range of motion. She exhibits no edema and no tenderness.  Neurological: She is alert and oriented to person, place, and time.       No sensory or motor deficits.  Skin: Skin is warm and dry.  Psychiatric: She has a normal mood and affect. Her behavior is normal.    ED Course  Procedures (including critical care time)   Labs Reviewed  CBC  COMPREHENSIVE METABOLIC PANEL  PROTIME-INR  APTT  URINALYSIS, ROUTINE W REFLEX MICROSCOPIC  PRO B NATRIURETIC PEPTIDE   1:58 PM Pt seen --> physical exam performed.  Lab workup for chest pain and shortness of breath ordered.  Old Charts reviewed.    4:22 PM Results for orders placed during the hospital encounter of 03/02/12  CBC      Component Value Range   WBC 8.7  4.0 - 10.5 (K/uL)   RBC 4.20  3.87 - 5.11 (MIL/uL)   Hemoglobin 10.0 (*) 12.0 - 15.0 (g/dL)   HCT 45.4 (*) 09.8 - 46.0 (%)   MCV 76.9 (*) 78.0 - 100.0 (fL)   MCH 23.8 (*) 26.0 - 34.0 (pg)   MCHC 31.0  30.0 - 36.0 (g/dL)   RDW 11.9 (*) 14.7 - 15.5 (%)   Platelets 267  150 - 400 (K/uL)  COMPREHENSIVE METABOLIC PANEL      Component Value Range   Sodium 138  135 - 145 (mEq/L)   Potassium 3.9  3.5 - 5.1 (mEq/L)   Chloride 104  96 - 112 (mEq/L)   CO2 23  19 - 32 (mEq/L)   Glucose, Bld 96  70 - 99 (mg/dL)   BUN 13  6 - 23 (mg/dL)   Creatinine, Ser 8.29  0.50 - 1.10 (mg/dL)   Calcium 9.4  8.4 - 56.2 (mg/dL)   Total Protein 6.6  6.0 - 8.3 (g/dL)   Albumin 3.7  3.5 - 5.2 (g/dL)   AST 13  0 - 37 (U/L)   ALT 10  0 - 35 (U/L)   Alkaline Phosphatase 57  39 - 117 (U/L)   Total Bilirubin 0.3  0.3 - 1.2 (mg/dL)   GFR calc non Af Amer 73 (*) >90 (mL/min)   GFR calc  Af Amer 84 (*) >90 (mL/min)  PROTIME-INR      Component Value Range   Prothrombin Time 22.8 (*) 11.6 - 15.2 (seconds)   INR 1.97 (*) 0.00 - 1.49  APTT      Component Value Range   aPTT 31  24 - 37 (seconds)  URINALYSIS, ROUTINE W REFLEX MICROSCOPIC      Component Value Range   Color, Urine YELLOW  YELLOW    APPearance CLEAR  CLEAR    Specific Gravity, Urine 1.009  1.005 - 1.030    pH 6.0  5.0 - 8.0    Glucose, UA NEGATIVE  NEGATIVE (mg/dL)   Hgb urine dipstick SMALL (*) NEGATIVE    Bilirubin Urine NEGATIVE  NEGATIVE    Ketones, ur NEGATIVE  NEGATIVE (mg/dL)   Protein, ur NEGATIVE  NEGATIVE (mg/dL)   Urobilinogen, UA 0.2  0.0 - 1.0 (mg/dL)   Nitrite POSITIVE (*) NEGATIVE    Leukocytes, UA SMALL (*) NEGATIVE   PRO B NATRIURETIC PEPTIDE      Component Value Range   Pro B Natriuretic peptide (BNP) 105.4  0 - 450 (pg/mL)  POCT I-STAT TROPONIN I      Component Value Range   Troponin i, poc 0.00  0.00 - 0.08 (ng/mL)   Comment 3           URINE MICROSCOPIC-ADD ON      Component Value Range   Squamous Epithelial / LPF RARE  RARE    WBC, UA 3-6  <3 (WBC/hpf)   RBC / HPF 0-2  <3 (RBC/hpf)   Bacteria, UA MANY (*) RARE     Dg Chest Portable 1 View  03/02/2012  *RADIOLOGY REPORT*  Clinical Data: Short of breath.  Chest discomfort.  Coronary artery disease.  PORTABLE CHEST - 1 VIEW  Comparison: 02/02/2012  Findings: Chronic elevation of right hemidiaphragm is stable.  Both lungs are clear.  No evidence of pleural effusion.  Heart size mediastinal contours are within normal limits.  IMPRESSION: No active disease.  Original Report Authenticated By: Danae Orleans, M.D.    Lab workup is essentially negative. Call to [facility] to see her, as she has known coronary artery disease with prior stenting, followed by Dr. Shawnie Pons.  5:17 PM Pt seen by Theodore Demark, PA-C for [facility]; spoke to Rollene Rotunda, M.D., who will be down to see pt.  1. Shortness of breath   2.  Chest pain            Carleene Cooper III, MD 03/02/12 1625  Carleene Cooper III, MD 03/02/12 [phone]

## 2012-03-02 NOTE — ED Notes (Signed)
[id number] Ready

## 2012-03-02 NOTE — Progress Notes (Signed)
ANTICOAGULATION CONSULT NOTE - Initial Consult  Pharmacy Consult for Heparin Indication: Hx DVT/PE; [place]; Subtherapeutic INR  Allergies  Allergen Reactions  . Amoxicillin     REACTION: unspecified  . Codeine Phosphate     REACTION: unspecified  . Promethazine Hcl     REACTION: unspecified  . Propoxyphene-Acetaminophen     REACTION: unspecified  . Sulfamethoxazole     REACTION: unspecified    Patient Measurements: Height: 5' 1.5" (156.2 cm) Weight: 147 lb (66.679 kg) IBW/kg (Calculated) : 48.95  Heparin Dosing Weight: 62.9kg  Vital Signs: Temp: 98.6 F (37 C) (05/02 1310) Temp src: Oral (05/02 1310) BP: 187/73 mmHg (05/02 1800) Pulse Rate: 88  (05/02 1800)  Labs:  Basename 03/02/12 1422  HGB 10.0*  HCT 32.3*  PLT 267  APTT 31  LABPROT 22.8*  INR 1.97*  HEPARINUNFRC --  CREATININE 0.78  CKTOTAL --  CKMB --  TROPONINI --   Estimated Creatinine Clearance: 43 ml/min (by C-G formula based on Cr of 0.78).  Medical History: Past Medical History  Diagnosis Date  . CAD (coronary artery disease)     S/p PTCA / stenting, unclear when/where  . 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 Started coumadin  . Breast CA 2005-2006    S/p lumpectomy and radiation  . HTN (hypertension)   . Restless leg syndrome   . PMR (polymyalgia rheumatica)     Medications:  See med rec  Assessment: 89yof to start heparin for hx PE/DVT and [place] with subtherapeutic INR (1.97). Patient is on Coumadin PTA but is currently on hold for possible cath. Patient states she has no current bleeding. With elevated INR, will not give heparin bolus. - Hg 10, Plts 267 - Heparin dosing weight: 62.9 kg  Goal of Therapy:  Heparin level 0.3-0.7 units/ml   Plan:  1. Heparin drip 750 units/hr (7.5 ml/hr) 2. Check heparin level 8 hours after heparin initiation 3. Daily heparin level and CBC 4. Follow-up plans for Coumadin restart  Cleon Dew [phone] 03/02/2012,8:03  PM

## 2012-03-03 DIAGNOSIS — R0602 Shortness of breath: Secondary | ICD-10-CM

## 2012-03-03 DIAGNOSIS — D649 Anemia, unspecified: Secondary | ICD-10-CM

## 2012-03-03 DIAGNOSIS — R079 Chest pain, unspecified: Secondary | ICD-10-CM

## 2012-03-03 LAB — COMPREHENSIVE METABOLIC PANEL
ALT: 10 U/L (ref 0–35)
AST: 16 U/L (ref 0–37)
Albumin: 3.6 g/dL (ref 3.5–5.2)
Alkaline Phosphatase: 58 U/L (ref 39–117)
BUN: 10 mg/dL (ref 6–23)
CO2: 22 mEq/L (ref 19–32)
Calcium: 9 mg/dL (ref 8.4–10.5)
Chloride: 104 mEq/L (ref 96–112)
Creatinine, Ser: 0.76 mg/dL (ref 0.50–1.10)
GFR calc Af Amer: 85 mL/min — ABNORMAL LOW (ref >90)
GFR calc non Af Amer: 73 mL/min — ABNORMAL LOW (ref >90)
Glucose, Bld: 107 mg/dL — ABNORMAL HIGH (ref 70–99)
Potassium: 4.2 mEq/L (ref 3.5–5.1)
Sodium: 138 mEq/L (ref 135–145)
Total Bilirubin: 0.4 mg/dL (ref 0.3–1.2)
Total Protein: 6.5 g/dL (ref 6.0–8.3)

## 2012-03-03 LAB — CBC
Platelets: 264 10*3/uL (ref 150–400)
RDW: 16.8 % — ABNORMAL HIGH (ref 11.5–15.5)
WBC: 7.8 10*3/uL (ref 4.0–10.5)

## 2012-03-03 LAB — CARDIAC PANEL(CRET KIN+CKTOT+MB+TROPI)
CK, MB: 1.9 ng/mL (ref 0.3–4.0)
CK, MB: 2.2 ng/mL (ref 0.3–4.0)
Relative Index: 0.6 (ref 0.0–2.5)
Total CK: 339 U/L — ABNORMAL HIGH (ref 7–177)
Troponin I: <0.3 ng/mL (ref <0.30)

## 2012-03-03 LAB — PROTIME-INR
INR: 1.49 (ref 0.00–1.49)
Prothrombin Time: 18.3 seconds — ABNORMAL HIGH (ref 11.6–15.2)

## 2012-03-03 LAB — IRON AND TIBC
Iron: 28 ug/dL — ABNORMAL LOW (ref 42–135)
Saturation Ratios: 7 % — ABNORMAL LOW (ref 20–55)
TIBC: 386 ug/dL (ref 250–470)
UIBC: 358 ug/dL (ref 125–400)

## 2012-03-03 LAB — HEPARIN LEVEL (UNFRACTIONATED): Heparin Unfractionated: 0.32 IU/mL (ref 0.30–0.70)

## 2012-03-03 LAB — LIPID PANEL
Cholesterol: 165 mg/dL (ref 0–200)
VLDL: 18 mg/dL (ref 0–40)

## 2012-03-03 MED ORDER — WARFARIN SODIUM 2.5 MG PO TABS
2.5000 mg | ORAL_TABLET | Freq: Once | ORAL | Status: AC
  Administered 2012-03-03: 2.5 mg via ORAL
  Filled 2012-03-03: qty 1

## 2012-03-03 MED ORDER — WARFARIN - PHARMACIST DOSING INPATIENT: Freq: Every day | Status: DC

## 2012-03-03 MED ORDER — ENOXAPARIN SODIUM 40 MG/0.4ML ~~LOC~~ SOLN
40.0000 mg | SUBCUTANEOUS | Status: DC
  Administered 2012-03-03 – 2012-03-05 (×3): 40 mg via SUBCUTANEOUS
  Filled 2012-03-03 (×3): qty 0.4

## 2012-03-03 NOTE — Progress Notes (Signed)
Pt was complaining of chest tightness around 2040. An Ekg was done and showed NSR with PACs. VS were done & charted. Pt was given 1 sublingual nitro which relieved her pain. MD on call was notified. wil continue to monitor the pt. [name] [name]

## 2012-03-03 NOTE — Progress Notes (Signed)
Subjective:  No further pain.  Has had some for about two weeks or so.  Had recent CT neg for PE.  No current pain.    Objective:  Vital Signs in the last 24 hours: Temp:  [98.4 F (36.9 C)-98.6 F (37 C)] 98.5 F (36.9 C) (05/03 0500) Pulse Rate:  [56-88] 69  (05/03 0500) Resp:  [12-20] 18  (05/03 0500) BP: (128-190)/(60-80) 158/76 mmHg (05/03 0500) SpO2:  [97 %-100 %] 100 % (05/03 0500) FiO2 (%):  [28 %] 28 % (05/02 2019) Weight:  [143 lb 8 oz (65.091 kg)-147 lb (66.679 kg)] 143 lb 8 oz (65.091 kg) (05/02 2100)  Intake/Output from previous day: 05/02 0701 - 05/03 0700 In: 255 [P.O.:255] Out: 450 [Urine:450]   Physical Exam: General: Well developed, well nourished, in no acute distress. Head:  Normocephalic and atraumatic. Lungs: Clear to auscultation and percussion. Heart: Normal S1 and S2.  No murmur, rubs or gallops.  Pulses: Pulses normal in all 4 extremities. Extremities: No clubbing or cyanosis. No edema. Neurologic: Alert and oriented x 3.    Lab Results:  Basename 03/03/12 0251 03/02/12 1422  WBC 7.8 8.7  HGB 10.3* 10.0*  PLT 264 267    Basename 03/03/12 0251 03/02/12 1422  NA 138 138  K 4.2 3.9  CL 104 104  CO2 22 23  GLUCOSE 107* 96  BUN 10 13  CREATININE 0.76 0.78    Basename 03/03/12 0251 03/02/12 2031  TROPONINI <0.30 <0.30   Hepatic Function Panel  Basename 03/03/12 0251  PROT 6.5  ALBUMIN 3.6  AST 16  ALT 10  ALKPHOS 58  BILITOT 0.4  BILIDIR --  IBILI --    Basename 03/03/12 0251  CHOL 165   No results found for this basename: PROTIME in the last 72 hours  Imaging: Dg Chest Portable 1 View  03/02/2012  *RADIOLOGY REPORT*  Clinical Data: Short of breath.  Chest discomfort.  Coronary artery disease.  PORTABLE CHEST - 1 VIEW  Comparison: 02/02/2012  Findings: Chronic elevation of right hemidiaphragm is stable.  Both lungs are clear.  No evidence of pleural effusion.  Heart size mediastinal contours are within normal limits.   IMPRESSION: No active disease.  Original Report Authenticated By: Danae Orleans, M.D.    EKG:  No acute changes.    Cardiac Studies:  Enzymes neg so far.    Assessment/Plan:  Patient Active Hospital Problem List: Angina pectoris, unstable (03/02/2012)   Assessment: may or may not be, but certainly has the substrate   Plan: with neg enzymes, dc heparin since she has the picture of microcytic anemia.  Monitor for now.  Will discuss next step with her sons.   Anemia (03/02/2012)   Assessment: microcytic indices, has been progressive   Plan: dc heparin, check iron levels, heme all stools.    Would impact plan if stools pos.        [name] Pons, MD, [facility], FSCAI 03/03/2012, 8:49 AM

## 2012-03-03 NOTE — Progress Notes (Signed)
Called MD on call about pt having an order for NS 2 178ml/hr md on call stated that was suppose to be a bolus that was to be given in the ED. Pt currently on Heparin at 7.5 and NS at kvo (58m/hr). Sanda Linger

## 2012-03-03 NOTE — Progress Notes (Signed)
ANTICOAGULATION CONSULT NOTE - Initial Consult  Pharmacy Consult for Heparin Indication: Hx DVT/PE; [place]; Subtherapeutic INR  Allergies  Allergen Reactions  . Amoxicillin     REACTION: unspecified  . Codeine Phosphate     REACTION: unspecified  . Promethazine Hcl     REACTION: unspecified  . Propoxyphene-Acetaminophen     REACTION: unspecified  . Sulfamethoxazole     REACTION: unspecified    Patient Measurements: Height: 5' 1.5" (156.2 cm) Weight: 143 lb 8 oz (65.091 kg) IBW/kg (Calculated) : 48.95  Heparin Dosing Weight: 62.9kg  Vital Signs: Temp: 98.4 F (36.9 C) (05/02 2100) Temp src: Oral (05/02 2035) BP: 142/80 mmHg (05/02 2202) Pulse Rate: 57  (05/02 2100)  Labs:  Basename 03/03/12 0251 03/02/12 2031 03/02/12 1422  HGB 10.3* -- 10.0*  HCT 32.3* -- 32.3*  PLT 264 -- 267  APTT -- -- 31  LABPROT -- -- 22.8*  INR -- -- 1.97*  HEPARINUNFRC 0.32 -- --  CREATININE 0.76 -- 0.78  CKTOTAL 339* 357* --  CKMB 1.9 2.0 --  TROPONINI <0.30 <0.30 --   Estimated Creatinine Clearance: 42.5 ml/min (by C-G formula based on Cr of 0.76).  Assessment: 78 yo female with h/o PE/DVT, chest pain today with subtherapeutic INR for Heparin Goal of Therapy:  Heparin level 0.3-0.7 units/ml   Plan:  Continue Heparin at current rate  Recheck level in 6 hrs to verify F/U plan  Eddie Candle 03/03/2012,4:06 AM

## 2012-03-03 NOTE — Progress Notes (Signed)
ANTICOAGULATION CONSULT NOTE - Follow Up Consult  Pharmacy Consult for Coumadin  Indication: DVT/PE - Hx 2011  Allergies  Allergen Reactions  . Amoxicillin     REACTION: unspecified  . Codeine Phosphate     REACTION: unspecified  . Promethazine Hcl     REACTION: unspecified  . Propoxyphene-Acetaminophen     REACTION: unspecified  . Sulfamethoxazole     REACTION: unspecified    Patient Measurements: Height: 5' 1.5" (156.2 cm) Weight: 143 lb 8 oz (65.091 kg) IBW/kg (Calculated) : 48.95    Vital Signs: Temp: 98.5 F (36.9 C) (05/03 0500) BP: 158/76 mmHg (05/03 0500) Pulse Rate: 69  (05/03 0500)  Labs:  Basename 03/03/12 1050 03/03/12 1028 03/03/12 0251 03/02/12 2031 03/02/12 1422  HGB -- -- 10.3* -- 10.0*  HCT -- -- 32.3* -- 32.3*  PLT -- -- 264 -- 267  APTT -- -- -- -- 31  LABPROT -- 18.3* -- -- 22.8*  INR -- 1.49 -- -- 1.97*  HEPARINUNFRC -- -- 0.32 -- --  CREATININE -- -- 0.76 -- 0.78  CKTOTAL 340* -- 339* 357* --  CKMB 2.2 -- 1.9 2.0 --  TROPONINI <0.30 -- <0.30 <0.30 --   Estimated Creatinine Clearance: 42.5 ml/min (by C-G formula based on Cr of 0.76).  Assessment: 69 yof admitted for CP/SOB with extensive CAD hx and Hx of PE/DVT - admit INR 1.97 recent CT neg PE. Cardiac enzymes negative so far - heparin drip d/c with anemia - changed to dvt px enoxaparin and restart warfarin.  Current INR 1.49 No coumadin given last pm last dose per pt 5/1. No bleeding noted but heme stool to be collected.     Goal of Therapy:  INR 2-3   Plan:  Coumadin 2.5mg  x1  - may need boost but will adjust tomorrow based off heme stool results Daily INR  Leota Sauers Pharm.D. CPP, BCPS Clinical Pharmacist [phone] 03/03/2012 11:52 AM

## 2012-03-04 LAB — BASIC METABOLIC PANEL
Calcium: 9 mg/dL (ref 8.4–10.5)
GFR calc non Af Amer: 59 mL/min — ABNORMAL LOW (ref >90)
Glucose, Bld: 90 mg/dL (ref 70–99)
Sodium: 136 mEq/L (ref 135–145)

## 2012-03-04 LAB — PROTIME-INR: INR: 1.52 — ABNORMAL HIGH (ref 0.00–1.49)

## 2012-03-04 LAB — CBC
Hemoglobin: 9.4 g/dL — ABNORMAL LOW (ref 12.0–15.0)
MCH: 24.3 pg — ABNORMAL LOW (ref 26.0–34.0)
MCHC: 31 g/dL (ref 30.0–36.0)
RDW: 17 % — ABNORMAL HIGH (ref 11.5–15.5)

## 2012-03-04 MED ORDER — WARFARIN SODIUM 4 MG PO TABS
4.0000 mg | ORAL_TABLET | Freq: Once | ORAL | Status: AC
  Administered 2012-03-04: 4 mg via ORAL
  Filled 2012-03-04: qty 1

## 2012-03-04 MED ORDER — SENNOSIDES-DOCUSATE SODIUM 8.6-50 MG PO TABS
1.0000 | ORAL_TABLET | Freq: Two times a day (BID) | ORAL | Status: DC
  Administered 2012-03-04 – 2012-03-05 (×3): 1 via ORAL
  Filled 2012-03-04 (×4): qty 1

## 2012-03-04 NOTE — Progress Notes (Signed)
Patient ID: [name] [name], female   DOB: October 26, 1921, 78 y.o.   MRN: [id number]   Patient Name: [name] [name] Date of Encounter: 03/04/2012    SUBJECTIVE  No CP. Wants to go home. Sitting in chair. No BM yet to check for blood. Hgb down from 10.3 to 9.4 this am. Coumadin dosed today and IV Heparin stopped yesterday.  CURRENT MEDS    . amLODipine  10 mg Oral Daily  . atorvastatin  20 mg Oral q1800  . cholecalciferol  2,000 Units Oral Daily  . clopidogrel  75 mg Oral Daily  . donepezil  5 mg Oral Daily  . enoxaparin (LOVENOX) injection  40 mg Subcutaneous Q24H  . mulitivitamin with minerals  1 tablet Oral Daily  . potassium chloride SA  20 mEq Oral Daily  . predniSONE  2 mg Oral Daily  . rOPINIRole  1 mg Oral QHS  . sodium chloride  3 mL Intravenous Q12H  . warfarin  2.5 mg Oral ONCE-1800  . Warfarin - Pharmacist Dosing Inpatient   Does not apply q1800    OBJECTIVE  Filed Vitals:   03/03/12 0500 03/03/12 1400 03/03/12 2100 03/04/12 0600  BP: 158/76 137/68 151/56 148/63  Pulse: 69 58 64 57  Temp: 98.5 F (36.9 C) 97.8 F (36.6 C) 98.5 F (36.9 C) 98 F (36.7 C)  TempSrc:  Oral    Resp: 18 18 14 16   Height:      Weight:      SpO2: 100% 99% 100% 100%    Intake/Output Summary (Last 24 hours) at 03/04/12 1116 Last data filed at 03/04/12 1000  Gross per 24 hour  Intake    360 ml  Output    250 ml  Net    110 ml   Filed Weights   03/02/12 1434 03/02/12 2100  Weight: 147 lb (66.679 kg) 143 lb 8 oz (65.091 kg)    PHYSICAL EXAM  General: Pleasant, NAD. Neuro: Alert and oriented X 3. Moves all extremities spontaneously. Psych: Normal affect. HEENT:  Normal  Neck: Supple without bruits or JVD. Lungs:  Resp regular and unlabored, CTA. Heart: IRR no s3, s4, or murmurs. Abdomen: Soft, non-tender, non-distended, BS + x 4.  Extremities: No clubbing, cyanosis or edema. DP/PT/Radials 2+ and equal bilaterally.  Accessory Clinical Findings  CBC  Basename 03/04/12  0615 03/03/12 0251  WBC 7.4 7.8  NEUTROABS -- --  HGB 9.4* 10.3*  HCT 30.3* 32.3*  MCV 78.3 77.1*  PLT 278 264   Basic Metabolic Panel  Basename 03/04/12 0615 03/03/12 0251  NA 136 138  K 3.8 4.2  CL 102 104  CO2 24 22  GLUCOSE 90 107*  BUN 21 10  CREATININE 0.85 0.76  CALCIUM 9.0 9.0  MG -- --  PHOS -- --   Liver Function Tests  Basename 03/03/12 0251 03/02/12 1422  AST 16 13  ALT 10 10  ALKPHOS 58 57  BILITOT 0.4 0.3  PROT 6.5 6.6  ALBUMIN 3.6 3.7   No results found for this basename: LIPASE:2,AMYLASE:2 in the last 72 hours Cardiac Enzymes  Basename 03/03/12 1050 03/03/12 0251 03/02/12 2031  CKTOTAL 340* 339* 357*  CKMB 2.2 1.9 2.0  CKMBINDEX -- -- --  TROPONINI <0.30 <0.30 <0.30   BNP No components found with this basename: POCBNP:3 D-Dimer  Basename 03/03/12 1028  DDIMER 0.31   Hemoglobin A1C No results found for this basename: HGBA1C in the last 72 hours Fasting Lipid Panel  Basename 03/03/12  0251  CHOL 165  HDL 68  LDLCALC 79  TRIG 90  CHOLHDL 2.4  LDLDIRECT --   Thyroid Function Tests No results found for this basename: TSH,T4TOTAL,FREET3,T3FREE,THYROIDAB in the last 72 hours  TELE  CAF  ECG    Radiology/Studies  Dg Chest Portable 1 View  03/02/2012  *RADIOLOGY REPORT*  Clinical Data: Short of breath.  Chest discomfort.  Coronary artery disease.  PORTABLE CHEST - 1 VIEW  Comparison: 02/02/2012  Findings: Chronic elevation of right hemidiaphragm is stable.  Both lungs are clear.  No evidence of pleural effusion.  Heart size mediastinal contours are within normal limits.  IMPRESSION: No active disease.  Original Report Authenticated By: Danae Orleans, M.D.    ASSESSMENT AND PLAN  Principal Problem:  *Angina pectoris, unstable Active Problems:  HYPERCHOLESTEROLEMIA  IIA  HYPERTENSION, BENIGN  CAD (coronary artery disease)  VTE (venous thromboembolism)  UTI (urinary tract infection)  Anemia    Will increase activity and check  next stool for blood. Will recheck Hgb in the am. Coumadin restarted. No sure of definitive plan with CAD till we determine if bleeding.  Signed, Valera Castle MD

## 2012-03-04 NOTE — Progress Notes (Signed)
ANTICOAGULATION CONSULT NOTE - Follow Up Consult  Pharmacy Consult for Coumadin  Indication: DVT/PE - Hx 2011  Allergies  Allergen Reactions  . Amoxicillin     REACTION: unspecified  . Codeine Phosphate     REACTION: unspecified  . Promethazine Hcl     REACTION: unspecified  . Propoxyphene-Acetaminophen     REACTION: unspecified  . Sulfamethoxazole     REACTION: unspecified    Patient Measurements: Height: 5' 1.5" (156.2 cm) Weight: 143 lb 8 oz (65.091 kg) IBW/kg (Calculated) : 48.95    Vital Signs: Temp: 98 F (36.7 C) (05/04 0600) BP: 148/63 mmHg (05/04 0600) Pulse Rate: 57  (05/04 0600)  Labs:  Basename 03/04/12 0615 03/03/12 1050 03/03/12 1028 03/03/12 0251 03/02/12 2031 03/02/12 1422  HGB 9.4* -- -- 10.3* -- --  HCT 30.3* -- -- 32.3* -- 32.3*  PLT 278 -- -- 264 -- 267  APTT -- -- -- -- -- 31  LABPROT 18.6* -- 18.3* -- -- 22.8*  INR 1.52* -- 1.49 -- -- 1.97*  HEPARINUNFRC -- -- -- 0.32 -- --  CREATININE 0.85 -- -- 0.76 -- 0.78  CKTOTAL -- 340* -- 339* 357* --  CKMB -- 2.2 -- 1.9 2.0 --  TROPONINI -- <0.30 -- <0.30 <0.30 --   Estimated Creatinine Clearance: 40 ml/min (by C-G formula based on Cr of 0.85).  Assessment: 71 yof admitted for CP/SOB with extensive CAD hx and Hx of PE/DVT - admit INR 1.97 recent CT neg PE. Cardiac enzymes negative so far - heparin drip d/c d/t anemia - changed to dvt px enoxaparin and restart warfarin.  Current INR 1.52. No bleeding noted but heme stool to be collected, slight drop in H/H today.     Goal of Therapy:  INR 2-3   Plan:  Coumadin 4mg  x1   Daily INR  Leota Sauers Pharm.D. CPP, BCPS Clinical Pharmacist [phone] 03/04/2012 10:25 AM

## 2012-03-05 ENCOUNTER — Encounter (HOSPITAL_COMMUNITY): Payer: Self-pay | Admitting: Nurse Practitioner

## 2012-03-05 DIAGNOSIS — G2581 Restless legs syndrome: Secondary | ICD-10-CM | POA: Insufficient documentation

## 2012-03-05 DIAGNOSIS — M353 Polymyalgia rheumatica: Secondary | ICD-10-CM | POA: Insufficient documentation

## 2012-03-05 LAB — CBC
HCT: 30.3 % — ABNORMAL LOW (ref 36.0–46.0)
MCV: 78.9 fL (ref 78.0–100.0)
RBC: 3.84 MIL/uL — ABNORMAL LOW (ref 3.87–5.11)
RDW: 17 % — ABNORMAL HIGH (ref 11.5–15.5)
WBC: 8.8 10*3/uL (ref 4.0–10.5)

## 2012-03-05 MED ORDER — WARFARIN SODIUM 2.5 MG PO TABS: ORAL_TABLET | ORAL | Status: DC

## 2012-03-05 MED ORDER — NITROGLYCERIN 0.4 MG SL SUBL: 0.4000 mg | SUBLINGUAL_TABLET | SUBLINGUAL | Status: DC | PRN

## 2012-03-05 MED ORDER — WARFARIN SODIUM 5 MG PO TABS
5.0000 mg | ORAL_TABLET | Freq: Once | ORAL | Status: AC
  Administered 2012-03-05: 5 mg via ORAL
  Filled 2012-03-05: qty 1

## 2012-03-05 MED ORDER — FERROUS SULFATE 325 (65 FE) MG PO TBEC: 325.0000 mg | DELAYED_RELEASE_TABLET | Freq: Every day | ORAL | Status: DC

## 2012-03-05 NOTE — Progress Notes (Signed)
Patient ID: JAXSYN CATALFAMO, female   DOB: [birth year], 78 y.o.   MRN: [id number]   Patient Name: [name] [name] Date of Encounter: 03/05/2012    SUBJECTIVE  No further chest pain. Wants to go home. Stool was guaiac negative. No CBC this morning. INR subtherapeutic but was on admission.  CURRENT MEDS    . amLODipine  10 mg Oral Daily  . atorvastatin  20 mg Oral q1800  . cholecalciferol  2,000 Units Oral Daily  . clopidogrel  75 mg Oral Daily  . donepezil  5 mg Oral Daily  . enoxaparin (LOVENOX) injection  40 mg Subcutaneous Q24H  . mulitivitamin with minerals  1 tablet Oral Daily  . potassium chloride SA  20 mEq Oral Daily  . predniSONE  2 mg Oral Daily  . rOPINIRole  1 mg Oral QHS  . senna-docusate  1 tablet Oral BID  . sodium chloride  3 mL Intravenous Q12H  . warfarin  4 mg Oral ONCE-1800  . Warfarin - Pharmacist Dosing Inpatient   Does not apply q1800    OBJECTIVE  Filed Vitals:   03/04/12 0600 03/04/12 1400 03/04/12 2100 03/05/12 0600  BP: 148/63 124/64 169/79 160/72  Pulse: 57 58 78 65  Temp: 98 F (36.7 C) 97.8 F (36.6 C) 98.4 F (36.9 C) 98.4 F (36.9 C)  TempSrc:  Oral    Resp: 16 18 16 16   Height:      Weight:    148 lb 2.4 oz (67.2 kg)  SpO2: 100% 99% 96% 98%    Intake/Output Summary (Last 24 hours) at 03/05/12 0917 Last data filed at 03/04/12 2137  Gross per 24 hour  Intake    483 ml  Output    550 ml  Net    -67 ml   Filed Weights   03/02/12 1434 03/02/12 2100 03/05/12 0600  Weight: 147 lb (66.679 kg) 143 lb 8 oz (65.091 kg) 148 lb 2.4 oz (67.2 kg)    PHYSICAL EXAM  General: Pleasant, NAD. Neuro: Alert and oriented X 3. Moves all extremities spontaneously. Psych: Normal affect. HEENT:  Normal  Neck: Supple without bruits or JVD. Lungs:  Resp regular and unlabored, CTA. Heart: RRR no s3, s4, or murmurs. Abdomen: Soft, non-tender, non-distended, BS + x 4.  Extremities: No clubbing, cyanosis or edema. DP/PT/Radials 2+ and equal  bilaterally.  Accessory Clinical Findings  CBC  Basename 03/04/12 0615 03/03/12 0251  WBC 7.4 7.8  NEUTROABS -- --  HGB 9.4* 10.3*  HCT 30.3* 32.3*  MCV 78.3 77.1*  PLT 278 264   Basic Metabolic Panel  Basename 03/04/12 0615 03/03/12 0251  NA 136 138  K 3.8 4.2  CL 102 104  CO2 24 22  GLUCOSE 90 107*  BUN 21 10  CREATININE 0.85 0.76  CALCIUM 9.0 9.0  MG -- --  PHOS -- --   Liver Function Tests  Basename 03/03/12 0251 03/02/12 1422  AST 16 13  ALT 10 10  ALKPHOS 58 57  BILITOT 0.4 0.3  PROT 6.5 6.6  ALBUMIN 3.6 3.7   No results found for this basename: LIPASE:2,AMYLASE:2 in the last 72 hours Cardiac Enzymes  Basename 03/03/12 1050 03/03/12 0251 03/02/12 2031  CKTOTAL 340* 339* 357*  CKMB 2.2 1.9 2.0  CKMBINDEX -- -- --  TROPONINI <0.30 <0.30 <0.30   BNP No components found with this basename: POCBNP:3 D-Dimer  Basename 03/03/12 1028  DDIMER 0.31   Hemoglobin A1C No results found for this basename: HGBA1C  in the last 72 hours Fasting Lipid Panel  Basename 03/03/12 0251  CHOL 165  HDL 68  LDLCALC 79  TRIG 90  CHOLHDL 2.4  LDLDIRECT --   Thyroid Function Tests No results found for this basename: TSH,T4TOTAL,FREET3,T3FREE,THYROIDAB in the last 72 hours  TELE  CAF  ECG    Radiology/Studies  Dg Chest Portable 1 View  03/02/2012  *RADIOLOGY REPORT*  Clinical Data: Short of breath.  Chest discomfort.  Coronary artery disease.  PORTABLE CHEST - 1 VIEW  Comparison: 02/02/2012  Findings: Chronic elevation of right hemidiaphragm is stable.  Both lungs are clear.  No evidence of pleural effusion.  Heart size mediastinal contours are within normal limits.  IMPRESSION: No active disease.  Original Report Authenticated By: Danae Orleans, M.D.    ASSESSMENT AND PLAN  Principal Problem:  *Angina pectoris, unstable Active Problems:  HYPERCHOLESTEROLEMIA  IIA  HYPERTENSION, BENIGN  CAD (coronary artery disease)  VTE (venous thromboembolism)  UTI  (urinary tract infection)  Anemia    She was guaiac-negative. INR is subtherapeutic but was on admission. We'll check CBC and have Dr. Vassie Loll dose her Coumadin. We will try to get her home this afternoon if her hemoglobin is stable. Close followup with Dr. Tedra Senegal. Medical therapy for now.  Signed, Valera Castle MD

## 2012-03-05 NOTE — Progress Notes (Signed)
ANTICOAGULATION CONSULT NOTE - Follow Up Consult  Pharmacy Consult for Coumadin  Indication: DVT/PE - Hx 2011  Allergies  Allergen Reactions  . Amoxicillin     REACTION: unspecified  . Codeine Phosphate     REACTION: unspecified  . Promethazine Hcl     REACTION: unspecified  . Propoxyphene-Acetaminophen     REACTION: unspecified  . Sulfamethoxazole     REACTION: unspecified    Patient Measurements: Height: 5' 1.5" (156.2 cm) Weight: 148 lb 2.4 oz (67.2 kg) IBW/kg (Calculated) : 48.95    Vital Signs: Temp: 98.4 F (36.9 C) (05/05 0600) BP: 145/75 mmHg (05/05 1022) Pulse Rate: 65  (05/05 0600)  Labs:  Basename 03/05/12 1103 03/05/12 0625 03/04/12 0615 03/03/12 1050 03/03/12 1028 03/03/12 0251 03/02/12 2031 03/02/12 1422  HGB 9.3* -- 9.4* -- -- -- -- --  HCT 30.3* -- 30.3* -- -- 32.3* -- --  PLT 263 -- 278 -- -- 264 -- --  APTT -- -- -- -- -- -- -- 31  LABPROT -- 18.5* 18.6* -- 18.3* -- -- --  INR -- 1.51* 1.52* -- 1.49 -- -- --  HEPARINUNFRC -- -- -- -- -- 0.32 -- --  CREATININE -- -- 0.85 -- -- 0.76 -- 0.78  CKTOTAL -- -- -- 340* -- 339* 357* --  CKMB -- -- -- 2.2 -- 1.9 2.0 --  TROPONINI -- -- -- <0.30 -- <0.30 <0.30 --   Estimated Creatinine Clearance: 40.7 ml/min (by C-G formula based on Cr of 0.85).  Assessment: 89 yof admitted for CP/SOB with extensive CAD hx and Hx of PE/DVT - admit INR 1.97 recent CT neg PE. Cardiac enzymes negative so far - heparin drip d/c d/t anemia - changed to dvt px enoxaparin and restart warfarin.  Current INR 1.51. No bleeding noted, heme stool negative, CBC stable.     Goal of Therapy:  INR 2-3   Plan:  Coumadin 5mg  x1   Daily INR  Leota Sauers Pharm.D. CPP, BCPS Clinical Pharmacist [phone] 03/05/2012 12:09 PM

## 2012-03-05 NOTE — Discharge Summary (Signed)
Patient ID: [name] [name],  MRN: [id number], DOB/AGE: February 21, 1921 79 y.o.  Admit date: 03/02/2012 Discharge date: 03/05/2012  Primary Care Provider: [name], MD Primary Cardiologist: T. Riley Kill, MD  Discharge Diagnoses Principal Problem:  *Angina pectoris, unstable Active Problems:  HYPERCHOLESTEROLEMIA  IIA  HYPERTENSION, BENIGN  CAD (coronary artery disease)  VTE (venous thromboembolism)  Anemia  PMR (polymyalgia rheumatica)  Restless leg syndrome   Allergies Allergies  Allergen Reactions  . Amoxicillin     REACTION: unspecified  . Codeine Phosphate     REACTION: unspecified  . Promethazine Hcl     REACTION: unspecified  . Propoxyphene-Acetaminophen     REACTION: unspecified  . Sulfamethoxazole     REACTION: unspecified    Procedures  None  History of Present Illness  78 y/o female with h/o CAD.  She was in her usoh until 3 AM on the day of admission when she was awakened with chest pain and dyspnea similar to her usual anginal episodes.  She presented to the [facility] ED where she became pain free.  ECG showed no acute changes and she was admitted for further evaluation.  Hospital Course  Pt r/o for MI.  She had no further chest pain.  In the absence of objective evidence of ischemia, we are not pursuing ischemic evaluation at this time (pt has expressed her wishes for conservative mgmt).    Her INR was subtherapeutic on admission and initially, she was placed on heparin.  This was subsequently discontinued secondary to normocytic anemia (and negative CE).  Coumadin was continued and dose adjusted.  Iron studies were revealed low Ferritin and serum Iron.  Stools were guaiac negative and hemoglobin has been stable.  We've initiated oral iron therapy and recommend outpt internal medicine follow-up.  She will be discharged home today in good condition.  Discharge Vitals Blood pressure 149/76, pulse 66, temperature 98.6 F (37 C), temperature source Oral,  resp. rate 20, height 5' 1.5" (1.562 m), weight 148 lb 2.4 oz (67.2 kg), SpO2 99.00%.  Filed Weights   03/02/12 1434 03/02/12 2100 03/05/12 0600  Weight: 147 lb (66.679 kg) 143 lb 8 oz (65.091 kg) 148 lb 2.4 oz (67.2 kg)    Labs  CBC  Basename 03/05/12 1103 03/04/12 0615  WBC 8.8 7.4  NEUTROABS -- --  HGB 9.3* 9.4*  HCT 30.3* 30.3*  MCV 78.9 78.3  PLT 263 278   Basic Metabolic Panel  Basename 03/04/12 0615 03/03/12 0251  NA 136 138  K 3.8 4.2  CL 102 104  CO2 24 22  GLUCOSE 90 107*  BUN 21 10  CREATININE 0.85 0.76  CALCIUM 9.0 9.0  MG -- --  PHOS -- --   Liver Function Tests  Basename 03/03/12 0251  AST 16  ALT 10  ALKPHOS 58  BILITOT 0.4  PROT 6.5  ALBUMIN 3.6   Cardiac Enzymes  Basename 03/03/12 1050 03/03/12 0251 03/02/12 2031  CKTOTAL 340* 339* 357*  CKMB 2.2 1.9 2.0  CKMBINDEX -- -- --  TROPONINI <0.30 <0.30 <0.30   D-Dimer  Basename 03/03/12 1028  DDIMER 0.31   Fasting Lipid Panel  Basename 03/03/12 0251  CHOL 165  HDL 68  LDLCALC 79  TRIG 90  CHOLHDL 2.4  LDLDIRECT --   Disposition  Pt is being discharged home today in good condition.  Follow-up Plans & Appointments  Follow-up Information    Follow up with Shawnie Pons, MD in 2 weeks. (we will arrange)    Contact information:  [address] [place] [address] [phone]       Follow up with [name], MD. (as scheduled.  You will need your coumadin level checked in 1 wk.)    Contact information:   [address], [address] [facility] [place] [address] [phone]          Discharge Medications  Medication List  As of 03/05/2012  5:01 PM   TAKE these medications         amLODipine 10 MG tablet   Commonly known as: NORVASC   Take 10 mg by mouth daily.      atorvastatin 20 MG tablet   Commonly known as: LIPITOR   Take 20 mg by mouth daily.      CALCIUM PO     Take 1 tablet by mouth daily.      cholecalciferol 1000 UNITS tablet   Commonly known as: VITAMIN D   Take 2,000 Units by mouth daily.      clopidogrel 75 MG tablet   Commonly known as: PLAVIX   Take 75 mg by mouth daily.      donepezil 5 MG tablet   Commonly known as: ARICEPT   Take 5 mg by mouth every morning.      ferrous sulfate 325 (65 FE) MG EC tablet   Take 1 tablet (325 mg total) by mouth daily with breakfast.      LORazepam 0.5 MG tablet   Commonly known as: ATIVAN   Take 0.25 mg by mouth every 8 (eight) hours as needed. Prn anxiety      mulitivitamin with minerals Tabs   Take 1 tablet by mouth daily.      nitroGLYCERIN 0.4 MG SL tablet   Commonly known as: NITROSTAT   Place 1 tablet (0.4 mg total) under the tongue every 5 (five) minutes x 3 doses as needed for chest pain.      potassium chloride SA 20 MEQ tablet   Commonly known as: K-DUR,KLOR-CON   Take 20 mEq by mouth daily.      predniSONE 1 MG tablet   Commonly known as: DELTASONE   Take 2 mg by mouth daily.      rOPINIRole 1 MG tablet   Commonly known as: REQUIP   Take 1 mg by mouth at bedtime.      warfarin 2.5 MG tablet   Commonly known as: COUMADIN   2 tabs tonight than 1 tab daily - follow-up INR on Wednesday      zaleplon 10 MG capsule   Commonly known as: SONATA   Take 10 mg by mouth at bedtime.           Outstanding Labs/Studies  F/U INR with PCP mid-week.  Duration of Discharge Encounter   Greater than 30 minutes including physician time.  Signed, Nicolasa Ducking NP 03/05/2012, 5:01 PM   Jesse Sans. Daleen Squibb, MD, [facility] Pager:  [phone]

## 2012-03-06 ENCOUNTER — Telehealth: Payer: Self-pay | Admitting: Physician Assistant

## 2012-03-06 NOTE — Telephone Encounter (Signed)
Received a note on abnormal labs, urine culture was positive for > 100,000 colonies of gram negative rods. Called pt home and spoke with son. Pt has had recurrent UTI and has been on abx twice recently. Advised him she had one now and got pharmacy number to call in abx. Pt son requested [facility] [phone]. Called in 7 day supply Cipro.

## 2012-03-07 ENCOUNTER — Encounter: Payer: Self-pay | Admitting: Physician Assistant

## 2012-03-07 LAB — URINE CULTURE: Culture  Setup Time: 201305051707

## 2012-03-12 ENCOUNTER — Emergency Department (HOSPITAL_COMMUNITY): Payer: Medicare Other

## 2012-03-12 ENCOUNTER — Encounter (HOSPITAL_COMMUNITY): Payer: Self-pay | Admitting: Physical Medicine and Rehabilitation

## 2012-03-12 ENCOUNTER — Emergency Department (HOSPITAL_COMMUNITY)
Admission: EM | Admit: 2012-03-12 | Discharge: 2012-03-12 | Disposition: A | Discharge status: Home / Self Care | Payer: Medicare Other | Attending: Emergency Medicine | Admitting: Emergency Medicine

## 2012-03-12 DIAGNOSIS — I2510 Atherosclerotic heart disease of native coronary artery without angina pectoris: Secondary | ICD-10-CM | POA: Insufficient documentation

## 2012-03-12 DIAGNOSIS — I10 Essential (primary) hypertension: Secondary | ICD-10-CM | POA: Insufficient documentation

## 2012-03-12 DIAGNOSIS — Z86718 Personal history of other venous thrombosis and embolism: Secondary | ICD-10-CM | POA: Insufficient documentation

## 2012-03-12 DIAGNOSIS — R079 Chest pain, unspecified: Secondary | ICD-10-CM

## 2012-03-12 DIAGNOSIS — Z853 Personal history of malignant neoplasm of breast: Secondary | ICD-10-CM | POA: Insufficient documentation

## 2012-03-12 DIAGNOSIS — R0602 Shortness of breath: Secondary | ICD-10-CM | POA: Insufficient documentation

## 2012-03-12 DIAGNOSIS — R0600 Dyspnea, unspecified: Secondary | ICD-10-CM | POA: Diagnosis present

## 2012-03-12 LAB — CBC
HCT: 32.5 % — ABNORMAL LOW (ref 36.0–46.0)
Hemoglobin: 10.2 g/dL — ABNORMAL LOW (ref 12.0–15.0)
MCH: 24.7 pg — ABNORMAL LOW (ref 26.0–34.0)
MCHC: 31.4 g/dL (ref 30.0–36.0)
RDW: 17.9 % — ABNORMAL HIGH (ref 11.5–15.5)

## 2012-03-12 LAB — PROTIME-INR
INR: 2.21 — ABNORMAL HIGH (ref 0.00–1.49)
Prothrombin Time: 24.9 seconds — ABNORMAL HIGH (ref 11.6–15.2)

## 2012-03-12 LAB — DIFFERENTIAL
Basophils Absolute: 0 10*3/uL (ref 0.0–0.1)
Basophils Relative: 0 % (ref 0–1)
Eosinophils Absolute: 0.1 10*3/uL (ref 0.0–0.7)
Eosinophils Relative: 1 % (ref 0–5)
Lymphocytes Relative: 15 % (ref 12–46)
Lymphs Abs: 1 10*3/uL (ref 0.7–4.0)
Monocytes Absolute: 0.7 10*3/uL (ref 0.1–1.0)
Monocytes Relative: 9 % (ref 3–12)
Neutro Abs: 5.2 10*3/uL (ref 1.7–7.7)
Neutrophils Relative %: 74 % (ref 43–77)

## 2012-03-12 LAB — COMPREHENSIVE METABOLIC PANEL
ALT: 14 U/L (ref 0–35)
AST: 19 U/L (ref 0–37)
Albumin: 3.3 g/dL — ABNORMAL LOW (ref 3.5–5.2)
Alkaline Phosphatase: 52 U/L (ref 39–117)
BUN: 15 mg/dL (ref 6–23)
CO2: 17 mEq/L — ABNORMAL LOW (ref 19–32)
Calcium: 9 mg/dL (ref 8.4–10.5)
Chloride: 104 mEq/L (ref 96–112)
Creatinine, Ser: 1 mg/dL (ref 0.50–1.10)
GFR calc Af Amer: 57 mL/min — ABNORMAL LOW (ref >90)
GFR calc non Af Amer: 49 mL/min — ABNORMAL LOW (ref >90)
Glucose, Bld: 95 mg/dL (ref 70–99)
Potassium: 4.1 mEq/L (ref 3.5–5.1)
Sodium: 136 mEq/L (ref 135–145)
Total Bilirubin: 0.1 mg/dL — ABNORMAL LOW (ref 0.3–1.2)
Total Protein: 6.4 g/dL (ref 6.0–8.3)

## 2012-03-12 LAB — POCT I-STAT TROPONIN I: Troponin i, poc: 0 ng/mL (ref 0.00–0.08)

## 2012-03-12 LAB — D-DIMER, QUANTITATIVE: D-Dimer, Quant: <0.22 ug/mL-FEU (ref 0.00–0.48)

## 2012-03-12 LAB — PRO B NATRIURETIC PEPTIDE: Pro B Natriuretic peptide (BNP): 101.6 pg/mL (ref 0–450)

## 2012-03-12 MED ORDER — SODIUM CHLORIDE 0.9 % IV SOLN: Freq: Once | INTRAVENOUS | Status: DC

## 2012-03-12 NOTE — ED Provider Notes (Signed)
Cardiology consultation is appreciated. Her cardiologist does not feel her pain is cardiac in origin. They are discharging her with a prescription for nitroglycerin.  [name] Booze, MD 03/12/12 [phone]

## 2012-03-12 NOTE — ED Notes (Signed)
DJ, EMT and myself undressed pt, placed in gown, on monitor, continuous pulse oximetry and blood pressure cuff; warm blankets given

## 2012-03-12 NOTE — ED Provider Notes (Signed)
History     CSN: [id number]  Arrival date & time 03/12/12  1344   First MD Initiated Contact with Patient 03/12/12 1350      Chief Complaint  Patient presents with  . Shortness of Breath    (Consider location/radiation/quality/duration/timing/severity/associated sxs/prior treatment) Patient is a 78 y.o. female presenting with shortness of breath. The history is provided by the patient.  Shortness of Breath  Associated symptoms include shortness of breath.   or shortness of breath and epigastric discomfort which started this morning at rest. Patient recently admitted for anginal symptoms and this is similar. Patient called EMS and was given nitroglycerin and her pain is greatly improved. She denies any nausea or diaphoresis. No substernal chest pressure or heaviness. No recent illnesses.  Past Medical History  Diagnosis Date  . CAD (coronary artery disease)     S/p PTCA / stenting, unclear when/where - on chronic plavix  . 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 Started coumadin  . Breast CA 2005-2006    S/p lumpectomy and radiation  . HTN (hypertension)   . Restless leg syndrome   . PMR (polymyalgia rheumatica)   . Angina pectoris     Past Surgical History  Procedure Date  . Cardiac surgery   . Colon surgery   . Breast surgery     No family history on file.  History  Substance Use Topics  . Smoking status: Never Smoker   . Smokeless tobacco: Never Used  . Alcohol Use: No    OB History    Grav Para Term Preterm Abortions TAB SAB Ect Mult Living                  Review of Systems  Respiratory: Positive for shortness of breath.   All other systems reviewed and are negative.    Allergies  Amoxicillin; Codeine phosphate; Darvocet; Promethazine hcl; Sulfamethoxazole; and Penicillins  Home Medications   Current Outpatient Rx  Name Route Sig Dispense Refill  . AMLODIPINE BESYLATE 10 MG PO TABS Oral Take 10 mg by mouth daily.    . ATORVASTATIN  CALCIUM 20 MG PO TABS Oral Take 20 mg by mouth daily.    Marland Kitchen CALCIUM PO Oral Take 1 tablet by mouth daily.    Marland Kitchen VITAMIN D 1000 UNITS PO TABS Oral Take 2,000 Units by mouth daily.    Marland Kitchen CLOPIDOGREL BISULFATE 75 MG PO TABS Oral Take 75 mg by mouth daily.    . DONEPEZIL HCL 5 MG PO TABS Oral Take 5 mg by mouth every morning.    Marland Kitchen FERROUS SULFATE 325 (65 FE) MG PO TBEC Oral Take 1 tablet (325 mg total) by mouth daily with breakfast. 90 tablet 11  . LORAZEPAM 0.5 MG PO TABS Oral Take 0.25 mg by mouth every 8 (eight) hours as needed. Prn anxiety    . ADULT MULTIVITAMIN W/MINERALS CH Oral Take 1 tablet by mouth daily.    Marland Kitchen NITROGLYCERIN 0.4 MG SL SUBL Sublingual Place 1 tablet (0.4 mg total) under the tongue every 5 (five) minutes x 3 doses as needed for chest pain. 25 tablet 3  . POTASSIUM CHLORIDE CRYS ER 20 MEQ PO TBCR Oral Take 20 mEq by mouth daily.    Marland Kitchen PREDNISONE 1 MG PO TABS Oral Take 2 mg by mouth daily.    Marland Kitchen ROPINIROLE HCL 1 MG PO TABS Oral Take 1 mg by mouth at bedtime.    . WARFARIN SODIUM 2.5 MG PO TABS  2 tabs tonight than 1 tab daily - follow-up INR on Wednesday    . ZALEPLON 10 MG PO CAPS Oral Take 10 mg by mouth at bedtime.      BP 129/66  Pulse 56  Temp(Src) 97.9 F (36.6 C) (Oral)  Resp 16  SpO2 97%  Physical Exam  Nursing note and vitals reviewed. Constitutional: She is oriented to person, place, and time. Vital signs are normal. She appears well-developed and well-nourished.  Non-toxic appearance. No distress.  HENT:  Head: Normocephalic and atraumatic.  Eyes: Conjunctivae, EOM and lids are normal. Pupils are equal, round, and reactive to light.  Neck: Normal range of motion. Neck supple. No tracheal deviation present. No mass present.  Cardiovascular: Normal rate, regular rhythm and normal heart sounds.  Exam reveals no gallop.   No murmur heard. Pulmonary/Chest: Effort normal and breath sounds normal. No stridor. No respiratory distress. She has no decreased breath  sounds. She has no wheezes. She has no rhonchi. She has no rales.  Abdominal: Soft. Normal appearance and bowel sounds are normal. She exhibits no distension. There is no tenderness. There is no rebound and no CVA tenderness.  Musculoskeletal: Normal range of motion. She exhibits no edema and no tenderness.  Neurological: She is alert and oriented to person, place, and time. She has normal strength. No cranial nerve deficit or sensory deficit. GCS eye subscore is 4. GCS verbal subscore is 5. GCS motor subscore is 6.  Skin: Skin is warm and dry. No abrasion and no rash noted.  Psychiatric: She has a normal mood and affect. Her speech is normal and behavior is normal.    ED Course  Procedures (including critical care time)   Labs Reviewed  CBC  DIFFERENTIAL  COMPREHENSIVE METABOLIC PANEL  PRO B NATRIURETIC PEPTIDE  D-DIMER, QUANTITATIVE  PROTIME-INR   No results found.   No diagnosis found.    MDM   Date: 03/12/2012  Rate: 57  Rhythm: normal sinus rhythm  QRS Axis: normal  Intervals: normal  ST/T Wave abnormalities: normal  Conduction Disutrbances:none  Narrative Interpretation:   Old EKG Reviewed: unchanged   Pt to be seen by cardiology       Toy Baker, MD 03/12/12 1601

## 2012-03-12 NOTE — Discharge Instructions (Signed)
Follow the instructions given to you by the cardiologist.

## 2012-03-12 NOTE — ED Notes (Signed)
Unsuccessful piv sticks - iv team paged.

## 2012-03-12 NOTE — ED Notes (Signed)
Pt presents to department via [facility] for evaluation of SOB. Was previously seen for same and diagnosed with angina, prescribed nitro, but pt states she did not take any at home today. Onset of symptoms this morning @ 5:00am. Respirations unlabored upon arrival to ED. She is conscious alert and oriented x4. Received 324 ASA, 1 sublingual nitro per EMS. NSR on EKG. No signs of acute distress at the time.

## 2012-03-12 NOTE — Consult Note (Signed)
CARDIOLOGY CONSULT NOTE  Patient ID: [name] [name] MRN: [id number] DOB/AGE: May 20, 1921 78 y.o.  Admit date: 03/12/2012 Referring Physician: ER physician Primary Glenice Bow, MD, MD Primary Cardiologist: Riley [name] Reason for Consultation:Dyspnea Active Problems:  HYPERTENSION, BENIGN  CAD (coronary artery disease)  Dyspnea  HPI:  78 y/o patient of [name] [name] with know history of CAD, unstable angina, hypertension, DVT on coumadin, who was recently admitted and discharged for similar symptoms one week ago. She was ruled out for MI. She was found to be anemic and initiated on iron therapy. She awoke around 5 am with shortness of breath and stomach pressure. She said her breathing was shallow. No diaphoresis, dizziness or chest pain. She states it lasted about 2 hours and didn't feel better so she called EMS.  They gave her a NTG sublingual and she felt better immediately. We are asked for further recommendations. She states that she did not have NTG at home. Review of labs shows improvement in Hgb to 10.2 from discharge Hgb 9.3. Potassium was normal, Creatinine 1.0. BNP 101.6. CXR negative for CHF or acute abnormality. She is resting comfortably without complaint. She denies sleep apnea or constipation.   Review of systems complete and found to be negative unless listed above   Past Medical History  Diagnosis Date  . CAD (coronary artery disease)     S/p PTCA / stenting, unclear when/where - on chronic plavix  . 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 Started coumadin  . Breast CA 2005-2006    S/p lumpectomy and radiation  . HTN (hypertension)   . Restless leg syndrome   . PMR (polymyalgia rheumatica)   . Angina pectoris     No family history on file.  History   Social History  . Marital Status: Widowed    Spouse Name: N/A    Number of Children: N/A  . Years of Education: N/A   Occupational History  . Not on file.   Social History Main Topics  .  Smoking status: Never Smoker   . Smokeless tobacco: Never Used  . Alcohol Use: No  . Drug Use: No  . Sexually Active:    Other Topics Concern  . Not on file   Social History Narrative   She lives at home with her son and is still ambulatory daily with a cane or walker. She denies any history of smoking. Marland Kitchen.Mother died at 2 from a myocardial infarction.  Father died at 37 from a stroke, possible history of PE. She has nine brothers and sisters, one sister with bypass and a brother with bypass, also has a brother who died abruptly of a pulmonary embolism.    Past Surgical History  Procedure Date  . Cardiac surgery   . Colon surgery   . Breast surgery       Physical Exam: Blood pressure 149/63, pulse 54, temperature 98 F (36.7 C), temperature source Oral, resp. rate 23, SpO2 98.00%.   General: Well developed, well nourished, in no acute distress Head: Eyes PERRLA, No xanthomas.   Normal cephalic and atramatic  Lungs: Clear bilaterally to auscultation and percussion. Heart: HRRR S1 S2, without MRG.  Pulses are 2+ & equal.            No carotid bruit. No JVD.  No abdominal bruits. No femoral bruits. Abdomen: Bowel sounds are positive, abdomen soft and non-tender without masses or  Hernia's noted. Msk:  Back normal, normal gait. Normal strength and tone for age. Extremities: No clubbing, cyanosis or edema.  DP +1 Neuro: Alert and oriented X 3. Psych:  Good affect, responds appropriately  Labs:   Lab Results  Component Value Date   WBC 7.0 03/12/2012   HGB 10.2* 03/12/2012   HCT 32.5* 03/12/2012   MCV 78.7 03/12/2012   PLT 274 03/12/2012    Lab 03/12/12 1355  NA 136  K 4.1  CL 104  CO2 17*  BUN 15  CREATININE 1.00  CALCIUM 9.0  PROT 6.4  BILITOT 0.1*  ALKPHOS 52  ALT 14  AST 19  GLUCOSE 95   Lab Results  Component Value Date   CKTOTAL 340* 03/03/2012   CKMB 2.2 03/03/2012   TROPONINI <0.30 03/03/2012    Lab Results  Component Value Date   CHOL 165  03/03/2012   CHOL  Value: 174        ATP III CLASSIFICATION:  <200     mg/dL   Desirable  161-096  mg/dL   Borderline High  >=045    mg/dL   High        02/07/8118   Lab Results  Component Value Date   HDL 68 03/03/2012   HDL 58 11/19/2008   Lab Results  Component Value Date   LDLCALC 79 03/03/2012   LDLCALC  Value: 105        Total Cholesterol/HDL:CHD Risk Coronary Heart Disease Risk Table                     Men   Women  1/2 Average Risk   3.4   3.3  Average Risk       5.0   4.4  2 X Average Risk   9.6   7.1  3 X Average Risk  23.4   11.0        Use the calculated Patient Ratio above and the CHD Risk Table to determine the patient's CHD Risk.        ATP III CLASSIFICATION (LDL):  <100     mg/dL   Optimal  147-829  mg/dL   Near or Above                    Optimal  130-159  mg/dL   Borderline  562-130  mg/dL   High  >865     mg/dL   Very High* 7/84/6962   Lab Results  Component Value Date   TRIG 90 03/03/2012   TRIG 55 11/19/2008   Lab Results  Component Value Date   CHOLHDL 2.4 03/03/2012   CHOLHDL 3.0 11/19/2008   No results found for this basename: LDLDIRECT   Echocardiogram 10/06/2010 Left ventricle: The cavity size was normal. Wall thickness was normal. Systolic function was normal. The estimated ejection fraction was in the range of 60% to 65%. Wall motion was normal; there were no regional wall motion abnormalities. Doppler parameters are consistent with abnormal left ventricular relaxation (grade 1 diastolic dysfunction). - Aortic valve: Trivial regurgitation. - Left atrium: The atrium was mildly dilated.     Radiology: Dg Chest Port 1 View  03/12/2012  *RADIOLOGY REPORT*  Clinical Data: Chest pain and shortness of breath.  PORTABLE CHEST - 1 VIEW  Comparison: 03/02/2012  Findings: Stable elevation of the right hemidiaphragm.  The lungs are clear.  No edema, pneumothorax, infiltrate or pleural fluid identified.  Heart size is normal.  IMPRESSION: No  active disease.  Original Report  Authenticated By: Reola Calkins, M.D.   EKG:NSR no evidence of acute ischemia heart rate 54 bpm.  ASSESSMENT AND PLAN:  1. Shortness of breath: No evidence of cardiac etiology for symptoms. CXR clear. Complete resolution of symptoms with NTG. She has negative troponin, BNP 101. D-Dimer 0.22  Will provide her with Rx for NTG and have her follow-up with [name] [name] as OP. May need to consider repeating echo as OP as she has not had one since 2011 for reassessment of LV fx.   2. Hypertension: Moderately controlled on amlodipine 10 mg. Continue current medications.  3. Bradycardia: Heart rate in the 50's without rate reducing medications. Will monitor this. Doubt this is etiology of her symptoms.  4. Abdominal pressure: She denies constipation or nausea. Bowel sounds are normal without pain elicited on palpation. She is on iron replacement. Recommend stool softner OTC.   Signed: Bettey Mare. Lyman Bishop NP [name] 03/12/2012, 4:24 PM Co-Sign MD  History reviewed with the patient, no changes to be made. Atypical epigastric discomfort.  Mild SOB.  No objective evidence of syncope.  Currently asymptomatic.   The patient exam reveals Lungs:  Clear,  COR RRR no rub, ABD Positive bowel sounds, no rebound no guarding.  All available labs, radiology testing, previous records reviewed. Agree with documented assessment and plan  OK to discharge to home.  Discussed with son and patient.  They would like conservative therapy if there are no acute changes in her condition.[name] Fearing Kmya [name]  5:37 PM  03/12/2012

## 2012-03-21 ENCOUNTER — Ambulatory Visit (INDEPENDENT_AMBULATORY_CARE_PROVIDER_SITE_OTHER): Payer: Medicare Other | Admitting: Cardiology

## 2012-03-21 ENCOUNTER — Encounter: Payer: Self-pay | Admitting: Cardiology

## 2012-03-21 VITALS — BP 133/76 | HR 60 | Resp 18 | Ht 63.0 in | Wt 152.1 lb

## 2012-03-21 DIAGNOSIS — I2510 Atherosclerotic heart disease of native coronary artery without angina pectoris: Secondary | ICD-10-CM

## 2012-03-21 DIAGNOSIS — E785 Hyperlipidemia, unspecified: Secondary | ICD-10-CM

## 2012-03-21 DIAGNOSIS — I10 Essential (primary) hypertension: Secondary | ICD-10-CM

## 2012-03-21 NOTE — Progress Notes (Signed)
HPI The patient presents for followup after 2 recent hospitalizations. Is admitted with chest pain ruled out. She was managed conservatively. She came back a few days later for an ER visit for chest discomfort and dyspnea. However, this was easily treated with sublingual nitroglycerin and there was no objective evidence of ischemia or heart failure. She was discharged from the emergency room. Since then she has been doing well and she denies any new shortness of breath, PND or orthopnea. She has had no chest pressure, neck or arm discomfort. She has had no palpitations, presyncope or syncope. She ambulates with her walker.  Allergies  Allergen Reactions  . Amoxicillin Other (See Comments)    Doesn't remember reaction  . Codeine Phosphate Nausea And Vomiting  . Darvocet (Propoxyphene-Acetaminophen) Nausea And Vomiting  . Promethazine Hcl Nausea And Vomiting  . Sulfamethoxazole Other (See Comments)    Pt doesn't remember reaction  . Penicillins Rash    Current Outpatient Prescriptions  Medication Sig Dispense Refill  . acetaminophen (TYLENOL) 325 MG tablet Take 650 mg by mouth every 6 (six) hours as needed.      Marland Kitchen amLODipine (NORVASC) 10 MG tablet Take 10 mg by mouth daily.      Marland Kitchen atorvastatin (LIPITOR) 20 MG tablet Take 20 mg by mouth daily.      Marland Kitchen CALCIUM PO Take 1 tablet by mouth daily.      . cholecalciferol (VITAMIN D) 1000 UNITS tablet Take 2,000 Units by mouth daily.      . clopidogrel (PLAVIX) 75 MG tablet Take 75 mg by mouth daily.      Marland Kitchen donepezil (ARICEPT) 5 MG tablet Take 5 mg by mouth every morning.      . ferrous sulfate 325 (65 FE) MG EC tablet Take 1 tablet (325 mg total) by mouth daily with breakfast.  90 tablet  11  . LORazepam (ATIVAN) 0.5 MG tablet Take 0.25 mg by mouth 3 (three) times daily as needed. Prn anxiety      . Multiple Vitamin (MULITIVITAMIN WITH MINERALS) TABS Take 1 tablet by mouth daily.      . nitroGLYCERIN (NITROSTAT) 0.4 MG SL tablet Place 1 tablet  (0.4 mg total) under the tongue every 5 (five) minutes x 3 doses as needed for chest pain.  25 tablet  3  . potassium chloride SA (K-DUR,KLOR-CON) 20 MEQ tablet Take 20 mEq by mouth daily.      . predniSONE (DELTASONE) 1 MG tablet Take 2 mg by mouth daily.      Marland Kitchen rOPINIRole (REQUIP) 1 MG tablet Take 1 mg by mouth at bedtime.      . traMADol (ULTRAM) 50 MG tablet Take 25 mg by mouth every 6 (six) hours as needed.      . warfarin (COUMADIN) 2.5 MG tablet Take 2.5 mg by mouth daily.      . zaleplon (SONATA) 10 MG capsule Take 10 mg by mouth at bedtime.      Marland Kitchen NEXIUM 40 MG capsule         Past Medical History  Diagnosis Date  . CAD (coronary artery disease)     S/p PTCA / stenting (last cath 2004, multiple LAD stents, 2 stents in the right coronary artery all patent)  . 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 Started coumadin  . Breast CA 2005-2006    S/p lumpectomy and radiation  . HTN (hypertension)   . Restless leg syndrome   . PMR (polymyalgia rheumatica)  Past Surgical History  Procedure Date  . Partial gastrectomy   . Colectomy   . Breast lumpectomy     ROS:  As stated in the HPI and negative for all other systems.  PHYSICAL EXAM BP 133/76  Pulse 60  Resp 18  Ht 5\' 3"  (1.6 m)  Wt 152 lb 1.9 oz (69.001 kg)  BMI 26.95 kg/m2 GENERAL:  Well appearing HEENT:  Pupils equal round and reactive, fundi not visualized, oral mucosa unremarkable NECK:  No jugular venous distention, waveform within normal limits, carotid upstroke brisk and symmetric, no bruits, no thyromegaly LYMPHATICS:  No cervical, inguinal adenopathy LUNGS:  Clear to auscultation bilaterally BACK:  No CVA tenderness CHEST:  Unremarkable HEART:  PMI not displaced or sustained,S1 and S2 within normal limits, no S3, no S4, no clicks, no rubs, no murmurs ABD:  Flat, positive bowel sounds normal in frequency in pitch, no bruits, no rebound, no guarding, no midline pulsatile mass, no hepatomegaly, no  splenomegaly EXT:  2 plus pulses throughout, no edema, no cyanosis no clubbing SKIN:  No rashes no nodules NEURO:  Cranial nerves II through XII grossly intact, motor grossly intact throughout PSYCH:  Cognitively intact, oriented to person place and time   ASSESSMENT AND PLAN

## 2012-03-21 NOTE — Assessment & Plan Note (Addendum)
The patient has no new sypmtoms.  No further cardiovascular testing is indicated.  We will continue with aggressive risk reduction and meds as listed. I have extensively reviewed previous records. The patient is new to me and I see that she's been on Coumadin Plavix and aspirin for a long period of time. The Coumadin has been since DVT and pulmonary embolism 2011. This was a recurrent DVT. She has many stents in her coronary arteries. Therefore, I think she needs both Coumadin and Plavix. However, she should discontinue the aspirin.

## 2012-03-21 NOTE — Patient Instructions (Signed)
The current medical regimen is effective;  continue present plan and medications.  Follow up in 4 months with Dr Hochrein 

## 2012-03-21 NOTE — Assessment & Plan Note (Signed)
Her lipids were at target during a recent hospitalization she will continue with current medications.

## 2012-03-21 NOTE — Assessment & Plan Note (Signed)
The blood pressure is at target. No change in medications is indicated. We will continue with therapeutic lifestyle changes (TLC).  

## 2012-05-25 ENCOUNTER — Encounter (HOSPITAL_COMMUNITY): Payer: Self-pay

## 2012-05-25 ENCOUNTER — Emergency Department (HOSPITAL_COMMUNITY)
Admission: EM | Admit: 2012-05-25 | Discharge: 2012-05-25 | Disposition: A | Discharge status: Home Health | Payer: Medicare Other | Attending: Emergency Medicine | Admitting: Emergency Medicine

## 2012-05-25 ENCOUNTER — Emergency Department (HOSPITAL_COMMUNITY): Payer: Medicare Other

## 2012-05-25 DIAGNOSIS — Z7901 Long term (current) use of anticoagulants: Secondary | ICD-10-CM | POA: Insufficient documentation

## 2012-05-25 DIAGNOSIS — I10 Essential (primary) hypertension: Secondary | ICD-10-CM | POA: Insufficient documentation

## 2012-05-25 DIAGNOSIS — I2510 Atherosclerotic heart disease of native coronary artery without angina pectoris: Secondary | ICD-10-CM | POA: Insufficient documentation

## 2012-05-25 DIAGNOSIS — N390 Urinary tract infection, site not specified: Secondary | ICD-10-CM

## 2012-05-25 DIAGNOSIS — Z853 Personal history of malignant neoplasm of breast: Secondary | ICD-10-CM | POA: Insufficient documentation

## 2012-05-25 DIAGNOSIS — Z79899 Other long term (current) drug therapy: Secondary | ICD-10-CM | POA: Insufficient documentation

## 2012-05-25 LAB — URINALYSIS, ROUTINE W REFLEX MICROSCOPIC
Glucose, UA: NEGATIVE mg/dL
Protein, ur: NEGATIVE mg/dL
Specific Gravity, Urine: 1.016 (ref 1.005–1.030)
pH: 5.5 (ref 5.0–8.0)

## 2012-05-25 LAB — CBC WITH DIFFERENTIAL/PLATELET
Basophils Absolute: 0 10*3/uL (ref 0.0–0.1)
HCT: 37.8 % (ref 36.0–46.0)
Lymphocytes Relative: 14 % (ref 12–46)
Monocytes Absolute: 0.6 10*3/uL (ref 0.1–1.0)
Neutro Abs: 6.3 10*3/uL (ref 1.7–7.7)
Platelets: 261 10*3/uL (ref 150–400)
RDW: 16.9 % — ABNORMAL HIGH (ref 11.5–15.5)
WBC: 8.1 10*3/uL (ref 4.0–10.5)

## 2012-05-25 LAB — PROTIME-INR
INR: 3.01 — ABNORMAL HIGH (ref 0.00–1.49)
Prothrombin Time: 31.7 seconds — ABNORMAL HIGH (ref 11.6–15.2)

## 2012-05-25 LAB — COMPREHENSIVE METABOLIC PANEL
Albumin: 3.4 g/dL — ABNORMAL LOW (ref 3.5–5.2)
BUN: 12 mg/dL (ref 6–23)
Calcium: 8.9 mg/dL (ref 8.4–10.5)
Creatinine, Ser: 0.79 mg/dL (ref 0.50–1.10)
Total Bilirubin: 0.1 mg/dL — ABNORMAL LOW (ref 0.3–1.2)
Total Protein: 6.4 g/dL (ref 6.0–8.3)

## 2012-05-25 MED ORDER — SODIUM CHLORIDE 0.9 % IV BOLUS (SEPSIS)
500.0000 mL | Freq: Once | INTRAVENOUS | Status: AC
  Administered 2012-05-25: 500 mL via INTRAVENOUS

## 2012-05-25 MED ORDER — DEXTROSE 5 % IV SOLN
1.0000 g | Freq: Once | INTRAVENOUS | Status: AC
  Administered 2012-05-25: 1 g via INTRAVENOUS
  Filled 2012-05-25: qty 10

## 2012-05-25 MED ORDER — NITROFURANTOIN MONOHYD MACRO 100 MG PO CAPS: 100.0000 mg | ORAL_CAPSULE | Freq: Two times a day (BID) | ORAL | Status: AC

## 2012-05-25 NOTE — ED Notes (Signed)
Pt denies pain.

## 2012-05-25 NOTE — ED Provider Notes (Signed)
History     CSN: [id number]  Arrival date & time 05/25/12  1521   First MD Initiated Contact with Patient 05/25/12 1528      Chief Complaint  Patient presents with  . Weakness     HPI Ambulance was sent for transport with chief complaint of being weakness.  A most reports when arrived patient was found to have a systolic blood pressure in the mid 80s.  Patient denies chest pain that has a significant cardiac history.  Patient currently has no complaints. Past Medical History  Diagnosis Date  . CAD (coronary artery disease)     S/p PTCA / stenting (last cath 2004, multiple LAD stents, 2 stents in the right coronary artery all patent)  . 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 Started coumadin  . Breast CA 2005-2006    S/p lumpectomy and radiation  . HTN (hypertension)   . Restless leg syndrome   . PMR (polymyalgia rheumatica)     Past Surgical History  Procedure Date  . Partial gastrectomy   . Colectomy   . Breast lumpectomy   . Stent     cardiac x 8 stents.    No family history on file.  History  Substance Use Topics  . Smoking status: Never Smoker   . Smokeless tobacco: Never Used  . Alcohol Use: No    OB History    Grav Para Term Preterm Abortions TAB SAB Ect Mult Living                  Review of Systems  All other systems reviewed and are negative.    Allergies  Codeine phosphate; Darvocet; Promethazine hcl; Sulfamethoxazole; Amoxicillin; and Penicillins  Home Medications   Current Outpatient Rx  Name Route Sig Dispense Refill  . ACETAMINOPHEN 500 MG PO TABS Oral Take 500 mg by mouth every 6 (six) hours as needed. For pain    . AMLODIPINE BESYLATE 10 MG PO TABS Oral Take 10 mg by mouth daily.    . ATORVASTATIN CALCIUM 20 MG PO TABS Oral Take 10 mg by mouth at bedtime.     Marland Kitchen VITAMIN D 1000 UNITS PO TABS Oral Take 2,000 Units by mouth daily.    Marland Kitchen CLOPIDOGREL BISULFATE 75 MG PO TABS Oral Take 75 mg by mouth daily.    . DONEPEZIL HCL  10 MG PO TABS Oral Take 10 mg by mouth every morning.    Marland Kitchen FERROUS SULFATE 325 (65 FE) MG PO TBEC Oral Take 325 mg by mouth daily with breakfast.    . LORAZEPAM 0.5 MG PO TABS Oral Take 0.25 mg by mouth 3 (three) times daily as needed. for anxiety    . ADULT MULTIVITAMIN W/MINERALS CH Oral Take 1 tablet by mouth daily.    Marland Kitchen NEXIUM 40 MG PO CPDR Oral Take 40 mg by mouth daily before breakfast.     . NITROGLYCERIN 0.4 MG SL SUBL Sublingual Place 0.4 mg under the tongue every 5 (five) minutes x 3 doses as needed. For chest pain    . POTASSIUM CHLORIDE CRYS ER 20 MEQ PO TBCR Oral Take 20 mEq by mouth daily.    Marland Kitchen PREDNISONE 1 MG PO TABS Oral Take 2 mg by mouth daily.    Marland Kitchen ROPINIROLE HCL 1 MG PO TABS Oral Take 0.5-1 mg by mouth 4 (four) times daily. Takes 1mg  midday, late afternoon take 0.5mg  then 1-3 hours before bedtime take 0.5mg  & then at bedtime take 1mg     .  TRAMADOL HCL 50 MG PO TABS Oral Take 25 mg by mouth every 6 (six) hours as needed. For pain    . WARFARIN SODIUM 2.5 MG PO TABS Oral Take 2.5 mg by mouth every evening.     Marland Kitchen ZALEPLON 10 MG PO CAPS Oral Take 10 mg by mouth at bedtime.    Marland Kitchen NITROFURANTOIN MONOHYD MACRO 100 MG PO CAPS Oral Take 1 capsule (100 mg total) by mouth 2 (two) times daily. 14 capsule 0    BP 143/64  Pulse 55  Temp 97.7 F (36.5 C) (Oral)  Resp 16  SpO2 99%  Physical Exam  Nursing note and vitals reviewed. Constitutional: She is oriented to person, place, and time. She appears well-developed. No distress.  HENT:  Head: Normocephalic and atraumatic.  Eyes: Pupils are equal, round, and reactive to light.  Neck: Normal range of motion.  Cardiovascular: Normal rate and intact distal pulses.        Sinus bradycardia Rate = 51 QTC = 41 Axis = normal No significant change when compared to previous tracing of May 2013  Pulmonary/Chest: No respiratory distress. She has no wheezes. She has no rales.  Abdominal: Normal appearance. She exhibits no distension and no  mass. There is no tenderness. There is no rebound.  Musculoskeletal: Normal range of motion.  Neurological: She is alert and oriented to person, place, and time. No cranial nerve deficit.  Skin: Skin is warm and dry. No rash noted.  Psychiatric: She has a normal mood and affect. Her behavior is normal.    ED Course  Procedures (including critical care time)  Labs Reviewed  COMPREHENSIVE METABOLIC PANEL - Abnormal; Notable for the following:    Glucose, Bld 126 (*)     Albumin 3.4 (*)     Total Bilirubin 0.1 (*)     GFR calc non Af Amer 72 (*)     GFR calc Af Amer 84 (*)     All other components within normal limits  URINALYSIS, ROUTINE W REFLEX MICROSCOPIC - Abnormal; Notable for the following:    APPearance CLOUDY (*)     Hgb urine dipstick MODERATE (*)     Nitrite POSITIVE (*)     Leukocytes, UA MODERATE (*)     All other components within normal limits  CBC WITH DIFFERENTIAL - Abnormal; Notable for the following:    RDW 16.9 (*)     Neutrophils Relative 78 (*)     All other components within normal limits  PROTIME-INR - Abnormal; Notable for the following:    Prothrombin Time 31.7 (*)     INR 3.01 (*)     All other components within normal limits  URINE MICROSCOPIC-ADD ON - Abnormal; Notable for the following:    Bacteria, UA MANY (*)     Casts HYALINE CASTS (*)     All other components within normal limits  URINE CULTURE  LACTIC ACID, PLASMA   No results found.   1. Urinary tract infection       MDM         Nelia Shi, MD 05/28/12 2250

## 2012-05-25 NOTE — ED Notes (Signed)
Patient transported to X-ray 

## 2012-05-25 NOTE — ED Notes (Signed)
Pt urinated in bed pan with assistance, no In/out Cath done

## 2012-05-25 NOTE — ED Notes (Signed)
Per EMS, pt was getting sponged off by niece and started feeling weak. Also reports nausea and loose stools for the past 24 hours. But patient informed EMS she had part of her colon removed. Does have dementia and is poor historian. Unable to pinpoint why she is here. 88 palpated BP, 52 SB on monitor. 24g to the Left Hand with 800 ml NS to count. Possibly lives with family.

## 2012-05-28 LAB — URINE CULTURE: Colony Count: >=100000

## 2012-05-29 NOTE — ED Notes (Signed)
+  Urine. Patient treated with Macrobid. Sensitive to same. Per protocol MD. °

## 2012-06-09 ENCOUNTER — Inpatient Hospital Stay (HOSPITAL_COMMUNITY)
Admission: EM | Admit: 2012-06-09 | Discharge: 2012-06-14 | DRG: 603 | Disposition: A | Discharge status: Skilled Nursing Facility | Payer: Medicare Other | Attending: Internal Medicine | Admitting: Internal Medicine

## 2012-06-09 ENCOUNTER — Inpatient Hospital Stay (HOSPITAL_COMMUNITY): Payer: Medicare Other

## 2012-06-09 ENCOUNTER — Encounter (HOSPITAL_COMMUNITY): Payer: Self-pay | Admitting: *Deleted

## 2012-06-09 ENCOUNTER — Emergency Department (HOSPITAL_COMMUNITY): Payer: Medicare Other

## 2012-06-09 DIAGNOSIS — R109 Unspecified abdominal pain: Secondary | ICD-10-CM

## 2012-06-09 DIAGNOSIS — I2510 Atherosclerotic heart disease of native coronary artery without angina pectoris: Secondary | ICD-10-CM | POA: Diagnosis present

## 2012-06-09 DIAGNOSIS — L0390 Cellulitis, unspecified: Secondary | ICD-10-CM | POA: Diagnosis present

## 2012-06-09 DIAGNOSIS — D649 Anemia, unspecified: Secondary | ICD-10-CM

## 2012-06-09 DIAGNOSIS — L0291 Cutaneous abscess, unspecified: Secondary | ICD-10-CM

## 2012-06-09 DIAGNOSIS — I10 Essential (primary) hypertension: Secondary | ICD-10-CM

## 2012-06-09 DIAGNOSIS — E7800 Pure hypercholesterolemia, unspecified: Secondary | ICD-10-CM

## 2012-06-09 DIAGNOSIS — R079 Chest pain, unspecified: Secondary | ICD-10-CM | POA: Diagnosis present

## 2012-06-09 DIAGNOSIS — IMO0002 Reserved for concepts with insufficient information to code with codable children: Principal | ICD-10-CM

## 2012-06-09 DIAGNOSIS — N390 Urinary tract infection, site not specified: Secondary | ICD-10-CM

## 2012-06-09 DIAGNOSIS — R001 Bradycardia, unspecified: Secondary | ICD-10-CM

## 2012-06-09 DIAGNOSIS — G2581 Restless legs syndrome: Secondary | ICD-10-CM

## 2012-06-09 DIAGNOSIS — R29898 Other symptoms and signs involving the musculoskeletal system: Secondary | ICD-10-CM

## 2012-06-09 DIAGNOSIS — I8290 Acute embolism and thrombosis of unspecified vein: Secondary | ICD-10-CM

## 2012-06-09 DIAGNOSIS — Z9861 Coronary angioplasty status: Secondary | ICD-10-CM

## 2012-06-09 DIAGNOSIS — M353 Polymyalgia rheumatica: Secondary | ICD-10-CM

## 2012-06-09 DIAGNOSIS — F411 Generalized anxiety disorder: Secondary | ICD-10-CM | POA: Diagnosis present

## 2012-06-09 DIAGNOSIS — E785 Hyperlipidemia, unspecified: Secondary | ICD-10-CM

## 2012-06-09 DIAGNOSIS — L03119 Cellulitis of unspecified part of limb: Secondary | ICD-10-CM

## 2012-06-09 DIAGNOSIS — Z853 Personal history of malignant neoplasm of breast: Secondary | ICD-10-CM

## 2012-06-09 DIAGNOSIS — R0600 Dyspnea, unspecified: Secondary | ICD-10-CM

## 2012-06-09 DIAGNOSIS — I498 Other specified cardiac arrhythmias: Secondary | ICD-10-CM | POA: Diagnosis present

## 2012-06-09 DIAGNOSIS — Z86718 Personal history of other venous thrombosis and embolism: Secondary | ICD-10-CM

## 2012-06-09 DIAGNOSIS — I200 Unstable angina: Secondary | ICD-10-CM

## 2012-06-09 HISTORY — DX: Other symptoms and signs involving the musculoskeletal system: R29.898

## 2012-06-09 LAB — CARDIAC PANEL(CRET KIN+CKTOT+MB+TROPI)
CK, MB: 2.1 ng/mL (ref 0.3–4.0)
CK, MB: 1.9 ng/mL (ref 0.3–4.0)
CK, MB: 2.2 ng/mL (ref 0.3–4.0)
Relative Index: 0.7 (ref 0.0–2.5)
Total CK: 298 U/L — ABNORMAL HIGH (ref 7–177)
Troponin I: <0.3 ng/mL (ref <0.30)
Troponin I: <0.3 ng/mL (ref <0.30)

## 2012-06-09 LAB — BASIC METABOLIC PANEL
BUN: 11 mg/dL (ref 6–23)
Calcium: 9 mg/dL (ref 8.4–10.5)
Chloride: 105 mEq/L (ref 96–112)
Creatinine, Ser: 0.81 mg/dL (ref 0.50–1.10)
GFR calc Af Amer: 73 mL/min — ABNORMAL LOW (ref >90)
GFR calc non Af Amer: 63 mL/min — ABNORMAL LOW (ref >90)

## 2012-06-09 LAB — URINE MICROSCOPIC-ADD ON

## 2012-06-09 LAB — URINALYSIS, ROUTINE W REFLEX MICROSCOPIC
Glucose, UA: NEGATIVE mg/dL
Ketones, ur: NEGATIVE mg/dL
pH: 6 (ref 5.0–8.0)

## 2012-06-09 LAB — PROCALCITONIN: Procalcitonin: <0.1 ng/mL

## 2012-06-09 LAB — CBC
HCT: 36 % (ref 36.0–46.0)
MCH: 27.4 pg (ref 26.0–34.0)
MCHC: 32.2 g/dL (ref 30.0–36.0)
MCV: 84.9 fL (ref 78.0–100.0)
RDW: 16.1 % — ABNORMAL HIGH (ref 11.5–15.5)

## 2012-06-09 LAB — POCT I-STAT TROPONIN I: Troponin i, poc: 0 ng/mL (ref 0.00–0.08)

## 2012-06-09 MED ORDER — WARFARIN SODIUM 2.5 MG PO TABS
2.5000 mg | ORAL_TABLET | Freq: Every day | ORAL | Status: DC
  Administered 2012-06-09: 2.5 mg via ORAL
  Filled 2012-06-09: qty 1

## 2012-06-09 MED ORDER — TRAMADOL HCL 50 MG PO TABS
25.0000 mg | ORAL_TABLET | Freq: Four times a day (QID) | ORAL | Status: DC | PRN
  Filled 2012-06-09: qty 1

## 2012-06-09 MED ORDER — FERROUS SULFATE 325 (65 FE) MG PO TBEC: 325.0000 mg | DELAYED_RELEASE_TABLET | Freq: Every day | ORAL | Status: DC

## 2012-06-09 MED ORDER — ACETAMINOPHEN 500 MG PO TABS
500.0000 mg | ORAL_TABLET | Freq: Four times a day (QID) | ORAL | Status: DC | PRN
  Administered 2012-06-09 – 2012-06-12 (×5): 500 mg via ORAL
  Filled 2012-06-09 (×5): qty 1

## 2012-06-09 MED ORDER — SODIUM CHLORIDE 0.9 % IV SOLN
INTRAVENOUS | Status: DC
  Administered 2012-06-09: 13:00:00 via INTRAVENOUS

## 2012-06-09 MED ORDER — NITROGLYCERIN 2 % TD OINT
1.0000 [in_us] | TOPICAL_OINTMENT | Freq: Once | TRANSDERMAL | Status: AC
  Administered 2012-06-09: 1 [in_us] via TOPICAL
  Filled 2012-06-09: qty 1

## 2012-06-09 MED ORDER — ROPINIROLE HCL 1 MG PO TABS
1.0000 mg | ORAL_TABLET | ORAL | Status: DC
  Administered 2012-06-10 – 2012-06-14 (×8): 1 mg via ORAL
  Filled 2012-06-09 (×13): qty 1

## 2012-06-09 MED ORDER — SODIUM CHLORIDE 0.9 % IJ SOLN
3.0000 mL | Freq: Two times a day (BID) | INTRAMUSCULAR | Status: DC
  Administered 2012-06-10 – 2012-06-13 (×5): 3 mL via INTRAVENOUS

## 2012-06-09 MED ORDER — PREDNISONE 1 MG PO TABS
2.0000 mg | ORAL_TABLET | Freq: Every day | ORAL | Status: DC
  Administered 2012-06-10 – 2012-06-14 (×5): 2 mg via ORAL
  Filled 2012-06-09 (×5): qty 2

## 2012-06-09 MED ORDER — ONDANSETRON HCL 4 MG PO TABS: 4.0000 mg | ORAL_TABLET | Freq: Four times a day (QID) | ORAL | Status: DC | PRN

## 2012-06-09 MED ORDER — NITROGLYCERIN 0.4 MG SL SUBL
0.4000 mg | SUBLINGUAL_TABLET | SUBLINGUAL | Status: DC | PRN
  Administered 2012-06-09: 0.4 mg via SUBLINGUAL

## 2012-06-09 MED ORDER — VANCOMYCIN HCL IN DEXTROSE 1-5 GM/200ML-% IV SOLN
1000.0000 mg | Freq: Once | INTRAVENOUS | Status: AC
  Administered 2012-06-09: 1000 mg via INTRAVENOUS
  Filled 2012-06-09: qty 200

## 2012-06-09 MED ORDER — AMLODIPINE BESYLATE 10 MG PO TABS
10.0000 mg | ORAL_TABLET | Freq: Every day | ORAL | Status: DC
  Administered 2012-06-10 – 2012-06-14 (×5): 10 mg via ORAL
  Filled 2012-06-09 (×5): qty 1

## 2012-06-09 MED ORDER — ONDANSETRON HCL 4 MG/2ML IJ SOLN: 4.0000 mg | Freq: Four times a day (QID) | INTRAMUSCULAR | Status: DC | PRN

## 2012-06-09 MED ORDER — DEXTROSE 5 % IV SOLN
1.0000 g | INTRAVENOUS | Status: DC
  Administered 2012-06-09 – 2012-06-11 (×3): 1 g via INTRAVENOUS
  Filled 2012-06-09 (×4): qty 10

## 2012-06-09 MED ORDER — ATORVASTATIN CALCIUM 10 MG PO TABS
10.0000 mg | ORAL_TABLET | Freq: Every day | ORAL | Status: DC
  Administered 2012-06-09 – 2012-06-13 (×6): 10 mg via ORAL
  Filled 2012-06-09 (×7): qty 1

## 2012-06-09 MED ORDER — CLOPIDOGREL BISULFATE 75 MG PO TABS
75.0000 mg | ORAL_TABLET | Freq: Every day | ORAL | Status: DC
  Administered 2012-06-10 – 2012-06-14 (×5): 75 mg via ORAL
  Filled 2012-06-09 (×5): qty 1

## 2012-06-09 MED ORDER — PHYTONADIONE 5 MG PO TABS
5.0000 mg | ORAL_TABLET | Freq: Once | ORAL | Status: AC
  Administered 2012-06-09: 5 mg via ORAL
  Filled 2012-06-09: qty 1

## 2012-06-09 MED ORDER — GI COCKTAIL ~~LOC~~
30.0000 mL | Freq: Once | ORAL | Status: AC
  Administered 2012-06-09: 30 mL via ORAL
  Filled 2012-06-09: qty 30

## 2012-06-09 MED ORDER — LORAZEPAM 0.5 MG PO TABS
0.2500 mg | ORAL_TABLET | Freq: Three times a day (TID) | ORAL | Status: DC | PRN
  Administered 2012-06-10 – 2012-06-14 (×4): 0.25 mg via ORAL
  Filled 2012-06-09 (×4): qty 1

## 2012-06-09 MED ORDER — IOHEXOL 300 MG/ML  SOLN
100.0000 mL | Freq: Once | INTRAMUSCULAR | Status: AC | PRN
  Administered 2012-06-09: 100 mL via INTRAVENOUS

## 2012-06-09 MED ORDER — WARFARIN - PHARMACIST DOSING INPATIENT
Freq: Every day | Status: DC
  Administered 2012-06-12: 18:00:00

## 2012-06-09 MED ORDER — VANCOMYCIN HCL 1000 MG IV SOLR
750.0000 mg | INTRAVENOUS | Status: DC
  Administered 2012-06-10 – 2012-06-12 (×3): 750 mg via INTRAVENOUS
  Filled 2012-06-09 (×4): qty 750

## 2012-06-09 MED ORDER — ROPINIROLE HCL 0.5 MG PO TABS
0.5000 mg | ORAL_TABLET | ORAL | Status: DC
  Administered 2012-06-09 – 2012-06-13 (×8): 0.5 mg via ORAL
  Filled 2012-06-09 (×13): qty 1

## 2012-06-09 MED ORDER — ZOLPIDEM TARTRATE 5 MG PO TABS
5.0000 mg | ORAL_TABLET | Freq: Every evening | ORAL | Status: DC | PRN
  Administered 2012-06-09: 5 mg via ORAL
  Filled 2012-06-09 (×2): qty 1

## 2012-06-09 MED ORDER — DONEPEZIL HCL 10 MG PO TABS
10.0000 mg | ORAL_TABLET | Freq: Every morning | ORAL | Status: DC
  Administered 2012-06-10 – 2012-06-14 (×5): 10 mg via ORAL
  Filled 2012-06-09 (×5): qty 1

## 2012-06-09 MED ORDER — POTASSIUM CHLORIDE CRYS ER 20 MEQ PO TBCR
20.0000 meq | EXTENDED_RELEASE_TABLET | Freq: Every day | ORAL | Status: DC
  Administered 2012-06-10 – 2012-06-14 (×5): 20 meq via ORAL
  Filled 2012-06-09 (×5): qty 1

## 2012-06-09 MED ORDER — FAMOTIDINE IN NACL 20-0.9 MG/50ML-% IV SOLN
20.0000 mg | Freq: Once | INTRAVENOUS | Status: AC
  Administered 2012-06-09: 20 mg via INTRAVENOUS
  Filled 2012-06-09: qty 50

## 2012-06-09 MED ORDER — ROPINIROLE HCL 0.5 MG PO TABS: 0.5000 mg | ORAL_TABLET | Freq: Four times a day (QID) | ORAL | Status: DC

## 2012-06-09 MED ORDER — SODIUM CHLORIDE 0.9 % IV SOLN
INTRAVENOUS | Status: AC
  Administered 2012-06-09: 18:00:00 via INTRAVENOUS

## 2012-06-09 MED ORDER — VITAMIN D3 25 MCG (1000 UNIT) PO TABS
2000.0000 [IU] | ORAL_TABLET | Freq: Every day | ORAL | Status: DC
  Administered 2012-06-10 – 2012-06-14 (×5): 2000 [IU] via ORAL
  Filled 2012-06-09 (×5): qty 2

## 2012-06-09 MED ORDER — ONDANSETRON HCL 4 MG/2ML IJ SOLN: 4.0000 mg | Freq: Three times a day (TID) | INTRAMUSCULAR | Status: AC | PRN

## 2012-06-09 MED ORDER — PANTOPRAZOLE SODIUM 40 MG PO TBEC
40.0000 mg | DELAYED_RELEASE_TABLET | Freq: Every day | ORAL | Status: DC
  Administered 2012-06-10 – 2012-06-14 (×5): 40 mg via ORAL
  Filled 2012-06-09 (×4): qty 1

## 2012-06-09 MED ORDER — GI COCKTAIL ~~LOC~~
30.0000 mL | Freq: Two times a day (BID) | ORAL | Status: DC | PRN
  Administered 2012-06-12: 30 mL via ORAL
  Filled 2012-06-09 (×2): qty 30

## 2012-06-09 MED ORDER — FERROUS SULFATE 325 (65 FE) MG PO TABS
325.0000 mg | ORAL_TABLET | Freq: Every day | ORAL | Status: DC
  Administered 2012-06-10 – 2012-06-14 (×5): 325 mg via ORAL
  Filled 2012-06-09 (×6): qty 1

## 2012-06-09 MED ORDER — ADULT MULTIVITAMIN W/MINERALS CH
1.0000 | ORAL_TABLET | Freq: Every day | ORAL | Status: DC
  Administered 2012-06-10 – 2012-06-14 (×5): 1 via ORAL
  Filled 2012-06-09 (×5): qty 1

## 2012-06-09 NOTE — ED Notes (Signed)
Per EMS pt from home with c/o weakness since yesterday. Also c/o chest pain to left side of chest described as heaviness. Given 4 baby aspirin, 1 nitro, CP relieved 3/10. Also c/o boil under left arm. Dx with UTI- taking doxycycline. Denies nausea/vomiting. IV 20G RAC. VSS. +Cardiac Hx- multiple stent placement. EKG NSR.

## 2012-06-09 NOTE — Progress Notes (Signed)
ANTIBIOTIC CONSULT NOTE - INITIAL  Pharmacy Consult for vanc/coumadin Indication: Cellulitis, DVT/PE  Allergies  Allergen Reactions  . Codeine Phosphate Nausea And Vomiting  . Darvocet (Propoxyphene-Acetaminophen) Nausea And Vomiting  . Promethazine Hcl Nausea And Vomiting  . Sulfamethoxazole Other (See Comments)    Pt doesn't remember reaction  . Amoxicillin Nausea And Vomiting and Rash  . Penicillins Nausea And Vomiting and Rash   Vital Signs: Temp: 98.1 F (36.7 C) (08/09 1036) Temp src: Oral (08/09 1036) BP: 148/53 mmHg (08/09 1345) Pulse Rate: 55  (08/09 1345) Intake/Output from previous day:   Intake/Output from this shift:    Labs:  Basename 06/09/12 1045  WBC 10.0  HGB 11.6*  PLT 230  LABCREA --  CREATININE 0.81   The CrCl is unknown because both a height and weight (above a minimum accepted value) are required for this calculation.  Microbiology: Recent Results (from the past 720 hour(s))  URINE CULTURE     Status: Normal   Collection Time   05/25/12  4:19 PM      Component Value Range Status Comment   Specimen Description URINE, CLEAN CATCH   Final    Special Requests ADDED 05/25/12 1746   Final    Culture  Setup Time 05/26/2012 00:59   Final    Colony Count >=100,000 COLONIES/ML   Final    Culture KLEBSIELLA PNEUMONIAE   Final    Report Status 05/28/2012 FINAL   Final    Organism ID, Bacteria KLEBSIELLA PNEUMONIAE   Final     Medical History: Past Medical History  Diagnosis Date  . CAD (coronary artery disease)     S/p PTCA / stenting (last cath 2004, multiple LAD stents, 2 stents in the right coronary artery all patent)  . 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 Started coumadin  . Breast CA 2005-2006    S/p lumpectomy and radiation  . HTN (hypertension)   . Restless leg syndrome   . PMR (polymyalgia rheumatica)     Medications:  Scheduled:    . sodium chloride   Intravenous STAT  . amLODipine  10 mg Oral Daily  .  atorvastatin  10 mg Oral QHS  . cefTRIAXone (ROCEPHIN)  IV  1 g Intravenous Q24H  . cholecalciferol  2,000 Units Oral Daily  . clopidogrel  75 mg Oral Daily  . donepezil  10 mg Oral q morning - 10a  . famotidine  20 mg Intravenous Once  . ferrous sulfate  325 mg Oral Q breakfast  . gi cocktail  30 mL Oral Once  . multivitamin with minerals  1 tablet Oral Daily  . nitroGLYCERIN  1 inch Topical Once  . pantoprazole  40 mg Oral Daily  . potassium chloride SA  20 mEq Oral Daily  . predniSONE  2 mg Oral Daily  . rOPINIRole  0.5-1 mg Oral QID  . sodium chloride  3 mL Intravenous Q12H  . vancomycin  1,000 mg Intravenous Once  . warfarin  2.5 mg Oral QPM   Assessment: 78 yo who was admitted for CP and cellulitis. She has had long standing CAD and no further workup per cards. In the ED she was also found to have cellulitis in her axillary region. Her INR is therapeutic today.   Goal of Therapy:  Vancomycin trough level 10-15 mcg/ml INR = 2-3  Plan:  Will cont coumadin 2.5mg  PO qday Vanc 750mg  IV q24 Daily INR  Ulyses Southward [place] 06/09/2012,3:31 PM

## 2012-06-09 NOTE — Consult Note (Signed)
[name] [name] [birth year]  [id number].   Primary Care MD: Dr. Merri Brunette Requesting MD: Dr. Marlin Canary  Chief Complaint/Reason for Consult: Left axillary abscess HPI: This is an 79yo WF who was admitted today with chest pain today.  Her complaint was epigastric and sub-xiphoid discomfort.  She was also noted to have some cellulitis and probable abscess in her left axilla.  She said it started earlier this week and has worsened.  She admits to pain tracking down into her left breast as well.  We were asked to see her for further evaluation.  She does have a h/o breast cancer on this left side in 2004.  Review of Systems: Please see HPI, otherwise all other systems are negative.  FH: Noncontributory  Past Medical History  Diagnosis Date  . CAD (coronary artery disease)     S/p PTCA / stenting (last cath 2004, multiple LAD stents, 2 stents in the right coronary artery all patent)  . 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 Started coumadin  . Breast CA 2005-2006    S/p lumpectomy and radiation  . HTN (hypertension)   . Restless leg syndrome   . PMR (polymyalgia rheumatica)     Past Surgical History  Procedure Date  . Colectomy   . Breast lumpectomy   . Stent     cardiac x 8 stents.    Social History:  reports that she has never smoked. She has never used smokeless tobacco. She reports that she does not drink alcohol or use illicit drugs.  Allergies:  Allergies  Allergen Reactions  . Codeine Phosphate Nausea And Vomiting  . Darvocet (Propoxyphene-Acetaminophen) Nausea And Vomiting  . Promethazine Hcl Nausea And Vomiting  . Sulfamethoxazole Other (See Comments)    Pt doesn't remember reaction  . Amoxicillin Nausea And Vomiting and Rash  . Penicillins Nausea And Vomiting and Rash    Medications Prior to Admission  Medication Sig Dispense Refill  . acetaminophen (TYLENOL) 500 MG tablet Take 500 mg by mouth every 6 (six) hours as needed. For pain      .  amLODipine (NORVASC) 10 MG tablet Take 10 mg by mouth daily.      Marland Kitchen atorvastatin (LIPITOR) 20 MG tablet Take 10 mg by mouth at bedtime.       . cholecalciferol (VITAMIN D) 1000 UNITS tablet Take 2,000 Units by mouth daily.      . clopidogrel (PLAVIX) 75 MG tablet Take 75 mg by mouth daily.      Marland Kitchen donepezil (ARICEPT) 10 MG tablet Take 10 mg by mouth every morning.      Marland Kitchen doxycycline (VIBRA-TABS) 100 MG tablet Take 100 mg by mouth daily.      . ferrous sulfate 325 (65 FE) MG EC tablet Take 325 mg by mouth daily with breakfast.      . LORazepam (ATIVAN) 0.5 MG tablet Take 0.25 mg by mouth 3 (three) times daily as needed. for anxiety      . Multiple Vitamin (MULITIVITAMIN WITH MINERALS) TABS Take 1 tablet by mouth daily.      Marland Kitchen NEXIUM 40 MG capsule Take 40 mg by mouth daily before breakfast.       . nitroGLYCERIN (NITROSTAT) 0.4 MG SL tablet Place 0.4 mg under the tongue every 5 (five) minutes x 3 doses as needed. For chest pain      . potassium chloride SA (K-DUR,KLOR-CON) 20 MEQ tablet Take 20 mEq by mouth daily.      Marland Kitchen  predniSONE (DELTASONE) 1 MG tablet Take 2 mg by mouth daily.      Marland Kitchen rOPINIRole (REQUIP) 1 MG tablet Take 0.5-1 mg by mouth 4 (four) times daily. Takes 1mg  midday, late afternoon take 0.5mg  then 1-3 hours before bedtime take 0.5mg  & then at bedtime take 1mg       . traMADol (ULTRAM) 50 MG tablet Take 25 mg by mouth every 6 (six) hours as needed. For pain      . warfarin (COUMADIN) 2.5 MG tablet Take 2.5 mg by mouth every evening.       . zaleplon (SONATA) 10 MG capsule Take 10 mg by mouth at bedtime.        Blood pressure 157/74, pulse 65, temperature 97.8 F (36.6 C), temperature source Oral, resp. rate 18, height 5\' 1"  (1.549 m), weight 148 lb 14.4 oz (67.541 kg), SpO2 96.00%. Physical Exam: General: pleasant, WD, WN white female who is laying in bed in NAD Heart: regular, rate, and rhythm.  Normal s1,s2. No obvious murmurs, gallops, or rubs noted.  Palpable radial and pedal  pulses bilaterally Lungs: CTAB, no wheezes, rhonchi, or rales noted.  Respiratory effort nonlabored  Chest: her left axilla reveals a small area of cellulitis and small amount of fluctuence.  She also has several "lumps" that are large in size noted that are possibly lymph nodes.  No drainage is noted in her axilla.  Her left breast has some deformities secondary to prior lumpectomy as well as aerolar changes.  She has some firmness at the top of her breast, which is tender to palpation, but mildly.  No definite breast masses palpable.  Right breast and axilla are normal. Abd: soft, NT, ND, +BS, no masses, hernias, or organomegaly, she does have multiple scars from prior abdominal surgery. Psych: A&Ox3 with an appropriate affect.    Results for orders placed during the hospital encounter of 06/09/12 (from the past 48 hour(s))  CBC     Status: Abnormal   Collection Time   06/09/12 10:45 AM      Component Value Range Comment   WBC 10.0  4.0 - 10.5 K/uL    RBC 4.24  3.87 - 5.11 MIL/uL    Hemoglobin 11.6 (*) 12.0 - 15.0 g/dL    HCT 16.1  09.6 - 04.5 %    MCV 84.9  78.0 - 100.0 fL    MCH 27.4  26.0 - 34.0 pg    MCHC 32.2  30.0 - 36.0 g/dL    RDW 40.9 (*) 81.1 - 15.5 %    Platelets 230  150 - 400 K/uL   BASIC METABOLIC PANEL     Status: Abnormal   Collection Time   06/09/12 10:45 AM      Component Value Range Comment   Sodium 137  135 - 145 mEq/L    Potassium 3.8  3.5 - 5.1 mEq/L    Chloride 105  96 - 112 mEq/L    CO2 22  19 - 32 mEq/L    Glucose, Bld 120 (*) 70 - 99 mg/dL    BUN 11  6 - 23 mg/dL    Creatinine, Ser 9.14  0.50 - 1.10 mg/dL    Calcium 9.0  8.4 - 78.2 mg/dL    GFR calc non Af Amer 63 (*) >90 mL/min    GFR calc Af Amer 73 (*) >90 mL/min   PROTIME-INR     Status: Abnormal   Collection Time   06/09/12 10:45 AM  Component Value Range Comment   Prothrombin Time 30.3 (*) 11.6 - 15.2 seconds    INR 2.84 (*) 0.00 - 1.49   PRO B NATRIURETIC PEPTIDE     Status: Normal    Collection Time   06/09/12 10:54 AM      Component Value Range Comment   Pro B Natriuretic peptide (BNP) 245.0  0 - 450 pg/mL   LACTIC ACID, PLASMA     Status: Abnormal   Collection Time   06/09/12 10:57 AM      Component Value Range Comment   Lactic Acid, Venous 2.4 (*) 0.5 - 2.2 mmol/L   PROCALCITONIN     Status: Normal   Collection Time   06/09/12 10:58 AM      Component Value Range Comment   Procalcitonin <0.10     POCT I-STAT TROPONIN I     Status: Normal   Collection Time   06/09/12 11:07 AM      Component Value Range Comment   Troponin i, poc 0.00  0.00 - 0.08 ng/mL    Comment 3            URINALYSIS, ROUTINE W REFLEX MICROSCOPIC     Status: Abnormal   Collection Time   06/09/12 12:12 PM      Component Value Range Comment   Color, Urine YELLOW  YELLOW    APPearance CLEAR  CLEAR    Specific Gravity, Urine 1.022  1.005 - 1.030    pH 6.0  5.0 - 8.0    Glucose, UA NEGATIVE  NEGATIVE mg/dL    Hgb urine dipstick MODERATE (*) NEGATIVE    Bilirubin Urine NEGATIVE  NEGATIVE    Ketones, ur NEGATIVE  NEGATIVE mg/dL    Protein, ur NEGATIVE  NEGATIVE mg/dL    Urobilinogen, UA 0.2  0.0 - 1.0 mg/dL    Nitrite NEGATIVE  NEGATIVE    Leukocytes, UA TRACE (*) NEGATIVE   URINE MICROSCOPIC-ADD ON     Status: Abnormal   Collection Time   06/09/12 12:12 PM      Component Value Range Comment   Squamous Epithelial / LPF FEW (*) RARE    WBC, UA 0-2  <3 WBC/hpf    RBC / HPF 0-2  <3 RBC/hpf    Bacteria, UA FEW (*) RARE    Casts HYALINE CASTS (*) NEGATIVE    Crystals CA OXALATE CRYSTALS (*) NEGATIVE    Urine-Other MUCOUS PRESENT     CARDIAC PANEL(CRET KIN+CKTOT+MB+TROPI)     Status: Abnormal   Collection Time   06/09/12  2:18 PM      Component Value Range Comment   Total CK 318 (*) 7 - 177 U/L    CK, MB 2.1  0.3 - 4.0 ng/mL    Troponin I <0.30  <0.30 ng/mL    Relative Index 0.7  0.0 - 2.5    Chest Portable 1 View  06/09/2012  *RADIOLOGY REPORT*  Clinical Data: Chest pain, shortness of breath.   PORTABLE CHEST - 1 VIEW  Comparison: 05/25/2012  Findings: Stable elevation of the right hemidiaphragm.  Heart is borderline in size.  Lungs are clear.  No effusions or acute bony abnormality.  IMPRESSION: No acute cardiopulmonary disease.  Original Report Authenticated By: Cyndie Chime, M.D.    Assessment/Plan 1. Left axillary cellulitis and abscess with possible lymphadenopathy.  I don't think that this is recurrent cancer. 2. Left breast tenderness, likely secondary to # 1 3. Chest pain 4. H/o CAD, on plavix 5.  H/o PE, on coumadin. 6. H/o left breast cancer - lumpectomy 01/2005 by Dr. Algis Downs. [name] [name].  Plan: 1. Will need to d/w Dr. Ezzard Standing.  It does appear that she has an abscess in the posterior portion of her axilla.  She also has possible lymphadenopathy, however, I want to d/w Dr. Ezzard Standing whether she needs a CT scan to distinguish possible abscess vs LAD vs both.  She has a h/o breast cancer on that side with radiation.  The firmness on this side may be secondary to her radiation, or may represent a new finding.   She is also on Plavix and Coumadin.  She will need both of these held and the coumadin reversed prior to any type of surgical procedure.  Thank you for this consultation.  We will follow with you.  OSBORNE,KELLY E 06/09/2012, 4:07 PM   PCP - Dr. Renne Crigler  Cardiology - Dr. Riley Kill I did her left breast lumpectomy and left axillary SLNBx - 02/04/2005.  She can not remember who her treating oncologist were. PT/INR - 30/2.8 - 06/09/2012 7+ cm mass in the left axilla that is tender.  CT scan will clarify some anatomy. I spoke with Dr. Benjamine Mola about reversing the coumandin.  Then she will need an I&D, either at the bedside or in the OR.  Ovidio Kin, MD, [facility] Surgery Pager: [phone] Office phone:  [phone]

## 2012-06-09 NOTE — H&P (Signed)
[facility] History and Physical  [name] [name] ZOX:[id number] DOB: 1920/11/21 DOA: 06/09/2012  Referring physician: er PCP: [name], MD   Chief Complaint: chest pain  HPI:  78 yo female with h/o CAD, VTE, BRCA, Polymyalgia rheumatica. Having recurrent complaints of chest pain vs epigastric pain. [facility] (Dr. Antoine Poche) is very familiar with the patient and has indicated (03/21/12 office visit note) that no further cardiology work up is indicated - she needs to be on both plavix and coumadin for history of many stents in her arteries and DVT/PE in 2011.   The patient reported that this am she had shortness of breath and heaviness in her left chest. When she tried to stand her legs were too weak and she had to sit back down. Chest/epigastric pain relieved with GI cocktail.    Recently found to have UTI and placed on doxy  She was not found to be orthostatic in the ED.   Further the EDP noted cellulitis (not hydradenitis) in her axillary region- patient states it has been worsening over the last few weeks, +chills, no fevers  C/o anxiety   Review of Systems:  All systems reviewed, negative unless stated above   Past Medical History  Diagnosis Date  . CAD (coronary artery disease)     S/p PTCA / stenting (last cath 2004, multiple LAD stents, 2 stents in the right coronary artery all patent)  . 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 Started coumadin  . Breast CA 2005-2006    S/p lumpectomy and radiation  . HTN (hypertension)   . Restless leg syndrome   . PMR (polymyalgia rheumatica)    Past Surgical History  Procedure Date  . Partial gastrectomy   . Colectomy   . Breast lumpectomy   . Stent     cardiac x 8 stents.   Social History:  reports that she has never smoked. She has never used smokeless tobacco. She reports that she does not drink alcohol or use illicit drugs. Lives at home but has a son and niece who check in on  her  Allergies  Allergen Reactions  . Codeine Phosphate Nausea And Vomiting  . Darvocet (Propoxyphene-Acetaminophen) Nausea And Vomiting  . Promethazine Hcl Nausea And Vomiting  . Sulfamethoxazole Other (See Comments)    Pt doesn't remember reaction  . Amoxicillin Nausea And Vomiting and Rash  . Penicillins Nausea And Vomiting and Rash    Family hx of CAD  Prior to Admission medications   Medication Sig Start Date End Date Taking? Authorizing Provider  acetaminophen (TYLENOL) 500 MG tablet Take 500 mg by mouth every 6 (six) hours as needed. For pain   Yes Historical Provider, MD  amLODipine (NORVASC) 10 MG tablet Take 10 mg by mouth daily.   Yes Historical Provider, MD  atorvastatin (LIPITOR) 20 MG tablet Take 10 mg by mouth at bedtime.    Yes Historical Provider, MD  cholecalciferol (VITAMIN D) 1000 UNITS tablet Take 2,000 Units by mouth daily.   Yes Historical Provider, MD  clopidogrel (PLAVIX) 75 MG tablet Take 75 mg by mouth daily.   Yes Historical Provider, MD  donepezil (ARICEPT) 10 MG tablet Take 10 mg by mouth every morning.   Yes Historical Provider, MD  doxycycline (VIBRA-TABS) 100 MG tablet Take 100 mg by mouth daily.   Yes Historical Provider, MD  ferrous sulfate 325 (65 FE) MG EC tablet Take 325 mg by mouth daily with breakfast. 03/05/12 03/05/13 Yes Ok Anis,  NP  LORazepam (ATIVAN) 0.5 MG tablet Take 0.25 mg by mouth 3 (three) times daily as needed. for anxiety   Yes Historical Provider, MD  Multiple Vitamin (MULITIVITAMIN WITH MINERALS) TABS Take 1 tablet by mouth daily.   Yes Historical Provider, MD  NEXIUM 40 MG capsule Take 40 mg by mouth daily before breakfast.  03/05/12  Yes Historical Provider, MD  nitroGLYCERIN (NITROSTAT) 0.4 MG SL tablet Place 0.4 mg under the tongue every 5 (five) minutes x 3 doses as needed. For chest pain 03/05/12 03/05/13 Yes Ok Anis, NP  potassium chloride SA (K-DUR,KLOR-CON) 20 MEQ tablet Take 20 mEq by mouth daily.   Yes  Historical Provider, MD  predniSONE (DELTASONE) 1 MG tablet Take 2 mg by mouth daily.   Yes Historical Provider, MD  rOPINIRole (REQUIP) 1 MG tablet Take 0.5-1 mg by mouth 4 (four) times daily. Takes 1mg  midday, late afternoon take 0.5mg  then 1-3 hours before bedtime take 0.5mg  & then at bedtime take 1mg    Yes Historical Provider, MD  traMADol (ULTRAM) 50 MG tablet Take 25 mg by mouth every 6 (six) hours as needed. For pain   Yes Historical Provider, MD  warfarin (COUMADIN) 2.5 MG tablet Take 2.5 mg by mouth every evening.    Yes Historical Provider, MD  zaleplon (SONATA) 10 MG capsule Take 10 mg by mouth at bedtime.   Yes Historical Provider, MD   Physical Exam: Filed Vitals:   06/09/12 1203 06/09/12 1204 06/09/12 1224 06/09/12 1230  BP: 131/62 134/64  140/53  Pulse: 60 71  53  Temp:      TempSrc:      Resp:    18  SpO2:   100% 100%     General:  A+Ox3, NAD, pleasant and cooperative  Eyes: WNL  ENT: WNL  Neck: supple  Cardiovascular: rrr  Respiratory: clear anterior, no wheezing  Abdomen: +BS, soft, NT/ND  Skin: L axilla abscess/lymph node enlarged with red non-blancing rash, painful to touch; L breast has areas of scar around the nipple causing dentations (patient states she has had surgery there in the past)  Musculoskeletal: generalized lower extremity weakness  Psychiatric: mood and affect normal  Neurologic: no focal deficits  Labs on Admission:  Basic Metabolic Panel:  Lab 06/09/12 4098  NA 137  K 3.8  CL 105  CO2 22  GLUCOSE 120*  BUN 11  CREATININE 0.81  CALCIUM 9.0  MG --  PHOS --   Liver Function Tests: No results found for this basename: AST:5,ALT:5,ALKPHOS:5,BILITOT:5,PROT:5,ALBUMIN:5 in the last 168 hours No results found for this basename: LIPASE:5,AMYLASE:5 in the last 168 hours No results found for this basename: AMMONIA:5 in the last 168 hours CBC:  Lab 06/09/12 1045  WBC 10.0  NEUTROABS --  HGB 11.6*  HCT 36.0  MCV 84.9  PLT 230    Cardiac Enzymes: No results found for this basename: CKTOTAL:5,CKMB:5,CKMBINDEX:5,TROPONINI:5 in the last 168 hours  BNP (last 3 results)  Basename 06/09/12 1054 03/12/12 1351 03/02/12 1423  PROBNP 245.0 101.6 105.4   CBG: No results found for this basename: GLUCAP:5 in the last 168 hours  Radiological Exams on Admission: Chest Portable 1 View  06/09/2012  *RADIOLOGY REPORT*  Clinical Data: Chest pain, shortness of breath.  PORTABLE CHEST - 1 VIEW  Comparison: 05/25/2012  Findings: Stable elevation of the right hemidiaphragm.  Heart is borderline in size.  Lungs are clear.  No effusions or acute bony abnormality.  IMPRESSION: No acute cardiopulmonary disease.  Original Report  Authenticated By: Cyndie Chime, M.D.      Assessment/Plan Active Problems:  VTE (venous thromboembolism)  HTN (hypertension)  Chest pain  Cellulitis  Weakness of both legs   1. UTI- partially treated with doxy- see if culture grows anything 2. Chest pain/epigastric pain- cycle CE, tele, at last visit to cardiologist he noted that there was nothing else to be done, needs to continue coumadin and plavix pain better with GI cockatil 3. Axilla abscess/cellulitis- tender, ? I/D- will ask surgery to see (complicating factor on coumadin), vanc/rocephin- ID dosing 4. Weakness- PT eval- SNF vs home with home health  Code Status: full Family Communication: patient at bedside Disposition Plan: home when better vs SNF  Time spent: 70 min  Ronit Marczak [facility] Pager [phone]  If 7PM-7AM, please contact night-coverage [email or web address] Password [email or web address] 06/09/2012, 2:30 PM

## 2012-06-09 NOTE — ED Notes (Signed)
[name] [name] (son) [phone]

## 2012-06-09 NOTE — Progress Notes (Signed)
Disposition Note  [name] [name], is a 78 y.o. female,   MRN: [id number]  -  DOB - 1921/05/03  Outpatient Primary MD for the patient is [name], MD   Blood pressure 140/53, pulse 53, temperature 98.1 F (36.7 C), temperature source Oral, resp. rate 18, SpO2 100.00%.  Active Problems:  Chest pain  Cellulitis  VTE (venous thromboembolism)  HTN (hypertension)  Weakness of both legs    78 yo female with h/o CAD, VTE, BRCA, Polymyalgia rheumatica.  Having recurrent complaints of chest pain vs epigastric pain.  [facility] (Dr. Antoine Poche) is very familiar with the patient and has indicated (03/21/12 office visit note) that no further cardiology work up is indicated - she needs to be on both plavix and coumadin for history of many stents in her arteries and DVT/PE in 2011.   The patient reported that this am she had shortness of breath and heaviness in her left chest.  When she tried to stand her legs were too weak and she had to sit back down.    She was not found to be orthostatic in the ED.  Further the EDP noted cellulitis (not hydradenitis) in her axillary region.    EKG shows Normal Sinus Huston Foley, I have ordered a set of cardiac enzymes.  I have not examined the patient or her arm pits.  I have requested a tele bed.   Algis Downs, PA-C [facility] Pager: [phone]

## 2012-06-09 NOTE — ED Provider Notes (Signed)
History     CSN: [id number]  Arrival date & time 06/09/12  1024   First MD Initiated Contact with Patient 06/09/12 1056      Chief Complaint  Patient presents with  . Chest Pain  . Weakness     HPI Pt was seen at 1125.  Per pt and her son, c/o gradual onset and improvement of one episode of left sided chest "pain" and SOB that began this morning PTA when she tried to stand from sitting position.  Describes the pain as "heaviness."  Was associated with generalized weakness, described as "my legs felt too weak and I had to sit down again."  Pt was given ASA and ntg SL by EMS en route with improvement in symptoms.  Denies palpitations, no back pain, no N/V/D, no abd pain, no fevers, no fall/injury.    Cards:  Dr. Riley Kill Past Medical History  Diagnosis Date  . CAD (coronary artery disease)     S/p PTCA / stenting (last cath 2004, multiple LAD stents, 2 stents in the right coronary artery all patent)  . 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 Started coumadin  . Breast CA 2005-2006    S/p lumpectomy and radiation  . HTN (hypertension)   . Restless leg syndrome   . PMR (polymyalgia rheumatica)     Past Surgical History  Procedure Date  . Partial gastrectomy   . Colectomy   . Breast lumpectomy   . Stent     cardiac x 8 stents.    History  Substance Use Topics  . Smoking status: Never Smoker   . Smokeless tobacco: Never Used  . Alcohol Use: No    Review of Systems ROS: Statement: All systems negative except as marked or noted in the HPI; Constitutional: Negative for fever and chills. ; ; Eyes: Negative for eye pain, redness and discharge. ; ; ENMT: Negative for ear pain, hoarseness, nasal congestion, sinus pressure and sore throat. ; ; Cardiovascular: +CP, SOB. Negative for palpitations, diaphoresis, and peripheral edema. ; ; Respiratory: Negative for cough, wheezing and stridor. ; ; Gastrointestinal: +nausea. Negative for vomiting, diarrhea, abdominal pain,  blood in stool, hematemesis, jaundice and rectal bleeding. . ; ; Genitourinary: Negative for dysuria, flank pain and hematuria. ; ; Musculoskeletal: Negative for back pain and neck pain. Negative for swelling and trauma.; ; Skin: +rash. Negative for pruritus, abrasions, blisters, bruising and skin lesion.; ; Neuro: +generalized weakness. Negative for headache, lightheadedness and neck stiffness. Negative for altered level of consciousness , altered mental status, extremity weakness, paresthesias, involuntary movement, seizure and syncope.      Allergies  Codeine phosphate; Darvocet; Promethazine hcl; Sulfamethoxazole; Amoxicillin; and Penicillins  Home Medications   Current Outpatient Rx  Name Route Sig Dispense Refill  . ACETAMINOPHEN 500 MG PO TABS Oral Take 500 mg by mouth every 6 (six) hours as needed. For pain    . AMLODIPINE BESYLATE 10 MG PO TABS Oral Take 10 mg by mouth daily.    . ATORVASTATIN CALCIUM 20 MG PO TABS Oral Take 10 mg by mouth at bedtime.     Marland Kitchen VITAMIN D 1000 UNITS PO TABS Oral Take 2,000 Units by mouth daily.    Marland Kitchen CLOPIDOGREL BISULFATE 75 MG PO TABS Oral Take 75 mg by mouth daily.    . DONEPEZIL HCL 10 MG PO TABS Oral Take 10 mg by mouth every morning.    Marland Kitchen FERROUS SULFATE 325 (65 FE) MG PO TBEC Oral Take 325  mg by mouth daily with breakfast.    . LORAZEPAM 0.5 MG PO TABS Oral Take 0.25 mg by mouth 3 (three) times daily as needed. for anxiety    . ADULT MULTIVITAMIN W/MINERALS CH Oral Take 1 tablet by mouth daily.    Marland Kitchen NEXIUM 40 MG PO CPDR Oral Take 40 mg by mouth daily before breakfast.     . NITROGLYCERIN 0.4 MG SL SUBL Sublingual Place 0.4 mg under the tongue every 5 (five) minutes x 3 doses as needed. For chest pain    . POTASSIUM CHLORIDE CRYS ER 20 MEQ PO TBCR Oral Take 20 mEq by mouth daily.    Marland Kitchen PREDNISONE 1 MG PO TABS Oral Take 2 mg by mouth daily.    Marland Kitchen ROPINIROLE HCL 1 MG PO TABS Oral Take 0.5-1 mg by mouth 4 (four) times daily. Takes 1mg  midday, late  afternoon take 0.5mg  then 1-3 hours before bedtime take 0.5mg  & then at bedtime take 1mg     . TRAMADOL HCL 50 MG PO TABS Oral Take 25 mg by mouth every 6 (six) hours as needed. For pain    . WARFARIN SODIUM 2.5 MG PO TABS Oral Take 2.5 mg by mouth every evening.     Marland Kitchen ZALEPLON 10 MG PO CAPS Oral Take 10 mg by mouth at bedtime.      BP 136/57  Pulse 62  Temp 98.1 F (36.7 C) (Oral)  Resp 16  SpO2 100%  Physical Exam 1130: Physical examination:  Nursing notes reviewed; Vital signs and O2 SAT reviewed;  Constitutional: Well developed, Well nourished, Well hydrated, In no acute distress; Head:  Normocephalic, atraumatic; Eyes: EOMI, PERRL, No scleral icterus; ENMT: Mouth and pharynx normal, Mucous membranes moist; Neck: Supple, Full range of motion, No lymphadenopathy; Cardiovascular: Regular rate and rhythm, No gallop; Respiratory: Breath sounds clear & equal bilaterally, No wheezes.  Speaking full sentences with ease, Normal respiratory effort/excursion; Chest: Nontender, Movement normal; Abdomen: Soft, +mild tenderness mid-epigastric area to palp.  No rebound or guarding. Nondistended, Normal bowel sounds;; Extremities: Pulses normal, No tenderness, 1+ bilat pedal edema, No calf asymmetry.; Neuro: AA&Ox3, Major CN grossly intact.  Speech clear. No facial droop. No gross focal motor or sensory deficits in extremities.; Skin: Color normal, Warm, Dry; +bilat axillary areas with scattered areas of erythema and induration of various sizes, left more than right with mild localized TTP, no drainage, no open wounds, no central pointing areas.   ED Course  Procedures  1135:  Pt states her chest pain and SOB are starting to return.  Will dose ntg SL.     1300:  Pt states her CP and SOB are completely resolved after ntg SL.  Pt now c/o mid-epigastric area "hurts."  Pt has had 2 recent hospital admits this year CP symptoms, as well as a recent ED visit CP.  Cards MD was consulted during the ED visit, felt  her symptoms were atypical for cardiac CP (pain located her her mid-epigastric area) and she was discharged.  Will tx pt with GI cocktail and pepcid for abd pain.  Not orthostatic on VS.  Bilat axillas lesions appear cellulitic at this time, no clear abscess right axilla, left post axillary cellulitic area with mild fluctuance; will not attempt I&D in ED due to multiple mitigating factors (hx breast CA with radiation on left side, on coumadin and plavix).  Pt's son now at bedside, states she was eval by her PMD this past week for same, rx doxycycline but is  only taking it once per day.  Will dose IV vancomycin.  Dx testing d/w pt and family.  Questions answered.  Verb understanding, agreeable to admit.    1310:   T/C to [facility] Clerance Lav, case discussed, including:  HPI, pertinent PM/SHx, VS/PE, dx testing, ED course and treatment:  Agreeable to admit, requests to write temporary orders, obtain tele bed to team 10/Dr. Rito Ehrlich.    MDM  MDM Reviewed: previous chart, nursing note and vitals Reviewed previous: ECG Interpretation: ECG, labs and x-ray    Date: 06/09/2012  Rate: 57  Rhythm: normal sinus rhythm  QRS Axis: normal  Intervals: normal  ST/T Wave abnormalities: normal  Conduction Disutrbances:none  Narrative Interpretation:   Old EKG Reviewed: unchanged; no significant changes from previous EKG dated 05/25/2012 ([facility]).    Results for orders placed during the hospital encounter of 06/09/12  CBC      Component Value Range   WBC 10.0  4.0 - 10.5 K/uL   RBC 4.24  3.87 - 5.11 MIL/uL   Hemoglobin 11.6 (*) 12.0 - 15.0 g/dL   HCT 40.9  81.1 - 91.4 %   MCV 84.9  78.0 - 100.0 fL   MCH 27.4  26.0 - 34.0 pg   MCHC 32.2  30.0 - 36.0 g/dL   RDW 78.2 (*) 95.6 - 21.3 %   Platelets 230  150 - 400 K/uL  BASIC METABOLIC PANEL      Component Value Range   Sodium 137  135 - 145 mEq/L   Potassium 3.8  3.5 - 5.1 mEq/L   Chloride 105  96 - 112 mEq/L   CO2 22  19 - 32 mEq/L   Glucose, Bld 120 (*)  70 - 99 mg/dL   BUN 11  6 - 23 mg/dL   Creatinine, Ser 0.86  0.50 - 1.10 mg/dL   Calcium 9.0  8.4 - 57.8 mg/dL   GFR calc non Af Amer 63 (*) >90 mL/min   GFR calc Af Amer 73 (*) >90 mL/min  PRO B NATRIURETIC PEPTIDE      Component Value Range   Pro B Natriuretic peptide (BNP) 245.0  0 - 450 pg/mL  URINALYSIS, ROUTINE W REFLEX MICROSCOPIC      Component Value Range   Color, Urine YELLOW  YELLOW   APPearance CLEAR  CLEAR   Specific Gravity, Urine 1.022  1.005 - 1.030   pH 6.0  5.0 - 8.0   Glucose, UA NEGATIVE  NEGATIVE mg/dL   Hgb urine dipstick MODERATE (*) NEGATIVE   Bilirubin Urine NEGATIVE  NEGATIVE   Ketones, ur NEGATIVE  NEGATIVE mg/dL   Protein, ur NEGATIVE  NEGATIVE mg/dL   Urobilinogen, UA 0.2  0.0 - 1.0 mg/dL   Nitrite NEGATIVE  NEGATIVE   Leukocytes, UA TRACE (*) NEGATIVE  LACTIC ACID, PLASMA      Component Value Range   Lactic Acid, Venous 2.4 (*) 0.5 - 2.2 mmol/L  PROCALCITONIN      Component Value Range   Procalcitonin <0.10    POCT I-STAT TROPONIN I      Component Value Range   Troponin i, poc 0.00  0.00 - 0.08 ng/mL   Comment 3           URINE MICROSCOPIC-ADD ON      Component Value Range   Squamous Epithelial / LPF FEW (*) RARE   WBC, UA 0-2  <3 WBC/hpf   RBC / HPF 0-2  <3 RBC/hpf   Bacteria, UA FEW (*)  RARE   Casts HYALINE CASTS (*) NEGATIVE   Crystals CA OXALATE CRYSTALS (*) NEGATIVE   Urine-Other MUCOUS PRESENT      Chest Portable 1 View 06/09/2012  *RADIOLOGY REPORT*  Clinical Data: Chest pain, shortness of breath.  PORTABLE CHEST - 1 VIEW  Comparison: 05/25/2012  Findings: Stable elevation of the right hemidiaphragm.  Heart is borderline in size.  Lungs are clear.  No effusions or acute bony abnormality.  IMPRESSION: No acute cardiopulmonary disease.  Original Report Authenticated By: Cyndie Chime, M.D.           Laray Anger, DO 06/10/12 1417

## 2012-06-10 LAB — COMPREHENSIVE METABOLIC PANEL
ALT: 10 U/L (ref 0–35)
AST: 14 U/L (ref 0–37)
Albumin: 2.7 g/dL — ABNORMAL LOW (ref 3.5–5.2)
Alkaline Phosphatase: 47 U/L (ref 39–117)
Calcium: 8.6 mg/dL (ref 8.4–10.5)
Potassium: 3.9 mEq/L (ref 3.5–5.1)
Sodium: 142 mEq/L (ref 135–145)
Total Protein: 5.9 g/dL — ABNORMAL LOW (ref 6.0–8.3)

## 2012-06-10 LAB — CARDIAC PANEL(CRET KIN+CKTOT+MB+TROPI)
CK, MB: 1.7 ng/mL (ref 0.3–4.0)
Total CK: 294 U/L — ABNORMAL HIGH (ref 7–177)
Troponin I: <0.3 ng/mL (ref <0.30)

## 2012-06-10 LAB — PROTIME-INR
INR: 1.27 (ref 0.00–1.49)
Prothrombin Time: 16.2 seconds — ABNORMAL HIGH (ref 11.6–15.2)

## 2012-06-10 LAB — ABO/RH: ABO/RH(D): A NEG

## 2012-06-10 MED ORDER — HYDRALAZINE HCL 20 MG/ML IJ SOLN
INTRAMUSCULAR | Status: AC
  Filled 2012-06-10: qty 1

## 2012-06-10 MED ORDER — CHOLESTYRAMINE LIGHT 4 G PO PACK
4.0000 g | PACK | Freq: Two times a day (BID) | ORAL | Status: DC
  Administered 2012-06-10 – 2012-06-13 (×6): 4 g via ORAL
  Filled 2012-06-10 (×9): qty 1

## 2012-06-10 MED ORDER — HYDRALAZINE HCL 20 MG/ML IJ SOLN
10.0000 mg | Freq: Four times a day (QID) | INTRAMUSCULAR | Status: DC | PRN
  Administered 2012-06-10 – 2012-06-12 (×2): 10 mg via INTRAVENOUS
  Filled 2012-06-10: qty 1

## 2012-06-10 MED ORDER — ZALEPLON 10 MG PO CAPS
10.0000 mg | ORAL_CAPSULE | Freq: Every day | ORAL | Status: DC
  Administered 2012-06-10 – 2012-06-12 (×3): 10 mg via ORAL

## 2012-06-10 NOTE — Progress Notes (Signed)
[facility] PROGRESS NOTE  [name] [name] WUJ:[id number] DOB: 02-26-1921 DOA: 06/09/2012 PCP: [name], MD  Assessment/Plan: Active Problems:  VTE (venous thromboembolism)  HTN (hypertension)  Chest pain  Cellulitis  Weakness of both legs  1. UTI- partially treated with doxy- see if culture grows anything (on abx for abscess) 2. Chest pain/epigastric pain- negative CE, tele, at last visit to cardiologist he noted that there was nothing else to be done, needs to continue coumadin and plavix, pain better with GI cockatil 3. Axilla abscess/cellulitis and R labial abscess- tender, ? Need for I/D- appreciate surgery's assistance (complicating factor is that patient is on coumadin- given vit K last PM and FFP this AM, will recheck INR in this PM and AM), vanc/rocephin-  4. Weakness- PT eval- SNF vs home with home health- may improve once infectious process treated    Code Status: full Family Communication: son at bedside Disposition Plan: home vs SNF- await PT eval    HPI/Subjective: Patient c/o right labial abscess as well- says getting bigger No CP, no SOB Son thinks she is de-conditioned  Objective: Filed Vitals:   06/09/12 1345 06/09/12 1400 06/09/12 2100 06/10/12 0500  BP: 148/53 157/74 165/80 152/86  Pulse: 55 65 71 61  Temp:  97.8 F (36.6 C) 98 F (36.7 C) 98.4 F (36.9 C)  TempSrc:   Oral Oral  Resp: 17 18 18 18   Height:  5\' 1"  (1.549 m)    Weight:  67.541 kg (148 lb 14.4 oz)    SpO2: 99% 96% 98% 97%   No intake or output data in the 24 hours ending 06/10/12 0851  Exam:   General:  Pleasant and cooperative  Cardiovascular: rrr  Respiratory: clear anteriorly  Abdomen: +BS, soft, NT/ND  SKin: L arm pit with area of abscess and R labia has smaller area of possible abscess  Data Reviewed: Basic Metabolic Panel:  Lab 06/10/12 9562 06/09/12 1045  NA 142 137  K 3.9 3.8  CL 108 105  CO2 23 22  GLUCOSE 98 120*  BUN 8 11  CREATININE  0.67 0.81  CALCIUM 8.6 9.0  MG -- --  PHOS -- --   Liver Function Tests:  Lab 06/10/12 0535  AST 14  ALT 10  ALKPHOS 47  BILITOT 0.2*  PROT 5.9*  ALBUMIN 2.7*   No results found for this basename: LIPASE:5,AMYLASE:5 in the last 168 hours No results found for this basename: AMMONIA:5 in the last 168 hours CBC:  Lab 06/09/12 1045  WBC 10.0  NEUTROABS --  HGB 11.6*  HCT 36.0  MCV 84.9  PLT 230   Cardiac Enzymes:  Lab 06/10/12 0726 06/09/12 2300 06/09/12 1555 06/09/12 1418  CKTOTAL 294* 298* 306* 318*  CKMB 1.7 2.2 1.9 2.1  CKMBINDEX -- -- -- --  TROPONINI <0.30 <0.30 <0.30 <0.30   BNP (last 3 results)  Basename 06/09/12 1054 03/12/12 1351 03/02/12 1423  PROBNP 245.0 101.6 105.4   CBG: No results found for this basename: GLUCAP:5 in the last 168 hours  No results found for this or any previous visit (from the past 240 hour(s)).   Studies: Dg Chest 2 View  05/25/2012  *RADIOLOGY REPORT*  Clinical Data: Weakness.  Hypotension.  CHEST - 2 VIEW  Comparison: 03/12/2012.  Findings: Coronary artery stent is noted on the lateral view. Linear scarring or atelectasis is present at the left lung base which appears similar to prior.  Elevation of the right hemidiaphragm appears chronic.  There is no  airspace disease.  No effusion.  Cardiopericardial silhouette is within normal limits. Chronic T9 and T12 compression fractures evident on the lateral view.  IMPRESSION: No active cardiopulmonary disease.  No interval change from prior.  Original Report Authenticated By: Andreas Newport, M.D.   Ct Chest W Contrast  06/09/2012  *RADIOLOGY REPORT*  Clinical Data: Chest pain, epigastric pain  CT CHEST WITH CONTRAST  Technique:  Multidetector CT imaging of the chest was performed following the standard protocol during bolus administration of intravenous contrast.  Contrast: OMNIPAQUE IOHEXOL 300 MG/ML  SOLN  Comparison: CT 02/02/2012  Findings: No axillary or supraclavicular  lymphadenopathy.  No mediastinal or hilar lymphadenopathy.  No pericardial fluid. Coronary stents are noted.  Esophagus is normal.  Review of the lung parenchyma demonstrates linear atelectasis at the right lung base.  No pleural fluid, pneumonia, or pneumothorax. Airways are normal.  Limited view of the upper abdomen is unremarkable.  Limited view of the skeleton again demonstrates mild compression fractures of the lower thoracic spine pain, unchanged from prior.  IMPRESSION:  1.  No acute thoracic findings. 2.  Chronic compression fracture lower thoracic spine.  Original Report Authenticated By: Genevive Bi, M.D.   Chest Portable 1 View  06/09/2012  *RADIOLOGY REPORT*  Clinical Data: Chest pain, shortness of breath.  PORTABLE CHEST - 1 VIEW  Comparison: 05/25/2012  Findings: Stable elevation of the right hemidiaphragm.  Heart is borderline in size.  Lungs are clear.  No effusions or acute bony abnormality.  IMPRESSION: No acute cardiopulmonary disease.  Original Report Authenticated By: Cyndie Chime, M.D.    Scheduled Meds:   . sodium chloride   Intravenous STAT  . amLODipine  10 mg Oral Daily  . atorvastatin  10 mg Oral QHS  . cefTRIAXone (ROCEPHIN)  IV  1 g Intravenous Q24H  . cholecalciferol  2,000 Units Oral Daily  . clopidogrel  75 mg Oral Daily  . donepezil  10 mg Oral q morning - 10a  . famotidine  20 mg Intravenous Once  . ferrous sulfate  325 mg Oral Q breakfast  . gi cocktail  30 mL Oral Once  . multivitamin with minerals  1 tablet Oral Daily  . nitroGLYCERIN  1 inch Topical Once  . pantoprazole  40 mg Oral Daily  . phytonadione  5 mg Oral Once  . potassium chloride SA  20 mEq Oral Daily  . predniSONE  2 mg Oral Daily  . rOPINIRole  0.5 mg Oral Custom  . rOPINIRole  1 mg Oral Custom  . sodium chloride  3 mL Intravenous Q12H  . vancomycin  750 mg Intravenous Q24H  . vancomycin  1,000 mg Intravenous Once  . Warfarin - Pharmacist Dosing Inpatient   Does not apply q1800  .  DISCONTD: ferrous sulfate  325 mg Oral Q breakfast  . DISCONTD: rOPINIRole  0.5-1 mg Oral QID  . DISCONTD: warfarin  2.5 mg Oral q1800   Continuous Infusions:   . DISCONTD: sodium chloride 75 mL/hr at 06/09/12 1317    Active Problems:  VTE (venous thromboembolism)  HTN (hypertension)  Chest pain  Cellulitis  Weakness of both legs    Time spent: 35    Marlin Canary  [facility] Pager [phone] 06/10/2012, 8:51 AM  LOS: 1 day

## 2012-06-10 NOTE — Evaluation (Signed)
Physical Therapy Evaluation Patient Details Name: [name] [name] MRN: [id number] DOB: [birth year] Today's Date: 06/10/2012 Time: 6213-0865 PT Time Calculation (min): 21 min  PT Assessment / Plan / Recommendation Clinical Impression  Patient s/p L axillary abscess, UTI and CP.  Does fairly well with the RW.  Able to get up and down from toilet with min guard assist.  Will need HH aide and HHPT f/u.      PT Assessment  Patient needs continued PT services    Follow Up Recommendations  Home health PT;Supervision/Assistance - 24 hour (HH aide)    Barriers to Discharge        Equipment Recommendations  None recommended by PT    Recommendations for Other Services     Frequency Min 3X/week    Precautions / Restrictions Precautions Precautions: Fall Restrictions Weight Bearing Restrictions: No   Pertinent Vitals/Pain VSS,Some pain      Mobility  Bed Mobility Bed Mobility: Rolling Left;Left Sidelying to Sit;Sitting - Scoot to Edge of Bed Rolling Left: 4: Min guard Left Sidelying to Sit: 4: Min assist;HOB elevated Sitting - Scoot to [place] of Bed: 4: Min guard Details for Bed Mobility Assistance: cues for technique Transfers Transfers: Sit to Stand;Stand to Sit Sit to Stand: With upper extremity assist;From bed;4: Min guard Stand to Sit: 4: Min guard;With upper extremity assist;With armrests;To chair/3-in-1 Details for Transfer Assistance: cues for hand placement Ambulation/Gait Ambulation/Gait Assistance: 4: Min guard Ambulation Distance (Feet): 100 Feet Assistive device: Rolling walker Ambulation/Gait Assistance Details: Patient needed occasional cues to stay close to RW and steer RW.  Overall safe with RW.   Gait Pattern: Step-through pattern;Decreased stride length;Shuffle;Trunk flexed Stairs: No Wheelchair Mobility Wheelchair Mobility: No         PT Diagnosis: Generalized weakness  PT Problem List: Decreased activity tolerance;Decreased balance;Decreased  mobility;Decreased safety awareness;Decreased knowledge of use of DME;Decreased knowledge of precautions PT Treatment Interventions: DME instruction;Gait training;Functional mobility training;Therapeutic activities;Therapeutic exercise;Balance training;Patient/family education   PT Goals Acute Rehab PT Goals PT Goal Formulation: With patient Time For Goal Achievement: 06/17/12 Potential to Achieve Goals: Good Pt will go Supine/Side to Sit: Independently PT Goal: Supine/Side to Sit - Progress: Goal set today Pt will Sit at Edge of Bed: Independently;3-5 min;with no upper extremity support PT Goal: Sit at Edge Of Bed - Progress: Goal set today Pt will go Sit to Stand: Independently;with upper extremity assist PT Goal: Sit to Stand - Progress: Goal set today Pt will Transfer Bed to Chair/Chair to Bed: Independently PT Transfer Goal: Bed to Chair/Chair to Bed - Progress: Goal set today Pt will Ambulate: 51 - 150 feet;with modified independence;with least restrictive assistive device PT Goal: Ambulate - Progress: Goal set today Pt will Go Up / Down Stairs: 1-2 stairs;with supervision;with least restrictive assistive device PT Goal: Up/Down Stairs - Progress: Goal set today  Visit Information  Last PT Received On: 06/10/12 Assistance Needed: +1    Subjective Data  Subjective: "I feel better." Patient Stated Goal: To be able to do my housework.   Prior Functioning  Home Living Lives With: Son Available Help at Discharge: Family;Available 24 hours/day (niece cleans) Type of Home: House Home Access: Stairs to enter [employer] of Steps: 1 Home Layout: One level Bathroom Shower/Tub: Engineer, manufacturing systems: Standard Home Adaptive Equipment: Walker - rolling;Hand-held shower hose;Shower chair with back;Bedside commode/3-in-1;Straight cane Prior Function Level of Independence: Independent with assistive device(s);Needs assistance Needs Assistance:  Bathing;Dressing;Feeding;Grooming;Toileting;Meal Prep;Light Housekeeping Bath: Moderate Dressing: Moderate Feeding: Supervision/set-up Grooming: Supervision/set-up  Toileting: Supervision/set-up Meal Prep: Total Light Housekeeping: Total Able to Take Stairs?: No (Needs assist for more than 1) Driving: No Vocation: Retired Musician: No difficulties Dominant Hand: Right    Cognition  Overall Cognitive Status: Appears within functional limits for tasks assessed/performed Arousal/Alertness: Awake/alert Orientation Level: Appears intact for tasks assessed Behavior During Session: [facility] for tasks performed    Extremity/Trunk Assessment Right Upper Extremity Assessment RUE ROM/Strength/Tone: WFL for tasks assessed RUE Sensation: WFL - Light Touch RUE Coordination: WFL - gross/fine motor Left Upper Extremity Assessment LUE ROM/Strength/Tone: WFL for tasks assessed LUE Sensation: WFL - Light Touch LUE Coordination: WFL - gross/fine motor Right Lower Extremity Assessment RLE ROM/Strength/Tone: WFL for tasks assessed RLE Sensation: WFL - Light Touch RLE Coordination: WFL - gross/fine motor Left Lower Extremity Assessment LLE ROM/Strength/Tone: WFL for tasks assessed LLE Sensation: WFL - Light Touch LLE Coordination: WFL - gross/fine motor Trunk Assessment Trunk Assessment: Normal   Balance Static Standing Balance Static Standing - Balance Support: Bilateral upper extremity supported;During functional activity Static Standing - Level of Assistance: 5: Stand by assistance Static Standing - Comment/# of Minutes: 2 minutes High Level Balance High Level Balance Activites: Direction changes;Turns High Level Balance Comments: Needs cues for RW with direction changes and turns  End of Session PT - End of Session Equipment Utilized During Treatment: Gait belt Activity Tolerance: Patient tolerated treatment well Patient left: in chair;with call bell/phone within  reach Nurse Communication: Mobility status       [name],[name] Pfund 06/10/2012, 1:50 PM  [facility] [phone] (pager)

## 2012-06-10 NOTE — Progress Notes (Signed)
  Subjective: No real complaints left arm sore but a little better  Objective: Vital signs in last 24 hours: Temp:  [97.8 F (36.6 C)-98.4 F (36.9 C)] 98.4 F (36.9 C) (08/10 0500) Pulse Rate:  [53-85] 85  (08/10 1020) Resp:  [17-18] 18  (08/10 0500) BP: (131-168)/(53-86) 168/81 mmHg (08/10 1020) SpO2:  [96 %-100 %] 97 % (08/10 0500) Weight:  [148 lb 14.4 oz (67.541 kg)] 148 lb 14.4 oz (67.541 kg) (08/09 1400) Last BM Date: 06/10/12  Intake/Output from previous day:   Intake/Output this shift: Total I/O In: 360 [P.O.:360] Out: -   left axilla with 4x3 cm area of induration, tenderness and erythema  Lab Results:   Basename 06/09/12 1045  WBC 10.0  HGB 11.6*  HCT 36.0  PLT 230   BMET  Basename 06/10/12 0535 06/09/12 1045  NA 142 137  K 3.9 3.8  CL 108 105  CO2 23 22  GLUCOSE 98 120*  BUN 8 11  CREATININE 0.67 0.81  CALCIUM 8.6 9.0   PT/INR  Basename 06/10/12 0535 06/09/12 1045  LABPROT 31.4* 30.3*  INR 2.97* 2.84*     Assessment/Plan: Left axillary abscess  inr 2.97 today, this is localized abscess that I think can likely get drained at bedside when inr better.  She is also on plavix so inr really needs to be more normal.  Will continue to follow Chest ct fine.  This is superficial abscess  LOS: 1 day    [facility] 06/10/2012

## 2012-06-10 NOTE — Progress Notes (Signed)
[name] [name] and son Gerlene Burdock) would both prefer, if at all possible, for [name] [name] to be d/c to SNF.  [name] states that his mother can not do anything for herself.  She MUST have assistance with all ADL's.  He does not think that [facility] [name] [name]/aide will be sufficient enough to get [name] [name] back to baseline.  If [name] [name] d/c home, she will be in the bed 90% of day.  [name] [name] was able to do most ADL;s about 1 yr ago.  Will notify Dr.Vann via text page.

## 2012-06-10 NOTE — Progress Notes (Signed)
ANTIBIOTIC CONSULT NOTE - INITIAL  Pharmacy Consult for vanc/coumadin Indication: Cellulitis, DVT/PE  Allergies  Allergen Reactions  . Codeine Phosphate Nausea And Vomiting  . Darvocet (Propoxyphene-Acetaminophen) Nausea And Vomiting  . Promethazine Hcl Nausea And Vomiting  . Sulfamethoxazole Other (See Comments)    Pt doesn't remember reaction  . Amoxicillin Nausea And Vomiting and Rash  . Penicillins Nausea And Vomiting and Rash   Vital Signs: Temp: 98.4 F (36.9 C) (08/10 0500) Temp src: Oral (08/10 0500) BP: 152/86 mmHg (08/10 0500) Pulse Rate: 61  (08/10 0500) Intake/Output from previous day:   Intake/Output from this shift:    Labs:  Basename 06/10/12 0535 06/09/12 1045  WBC -- 10.0  HGB -- 11.6*  PLT -- 230  LABCREA -- --  CREATININE 0.67 0.81   Estimated Creatinine Clearance: 42.7 ml/min (by C-G formula based on Cr of 0.67).  Microbiology: Recent Results (from the past 720 hour(s))  URINE CULTURE     Status: Normal   Collection Time   05/25/12  4:19 PM      Component Value Range Status Comment   Specimen Description URINE, CLEAN CATCH   Final    Special Requests ADDED 05/25/12 1746   Final    Culture  Setup Time 05/26/2012 00:59   Final    Colony Count >=100,000 COLONIES/ML   Final    Culture KLEBSIELLA PNEUMONIAE   Final    Report Status 05/28/2012 FINAL   Final    Organism ID, Bacteria KLEBSIELLA PNEUMONIAE   Final     Medical History: Past Medical History  Diagnosis Date  . CAD (coronary artery disease)     S/p PTCA / stenting (last cath 2004, multiple LAD stents, 2 stents in the right coronary artery all patent)  . 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 Started coumadin  . Breast CA 2005-2006    S/p lumpectomy and radiation  . HTN (hypertension)   . Restless leg syndrome   . PMR (polymyalgia rheumatica)     Medications:  Scheduled:     . sodium chloride   Intravenous STAT  . amLODipine  10 mg Oral Daily  . atorvastatin  10  mg Oral QHS  . cefTRIAXone (ROCEPHIN)  IV  1 g Intravenous Q24H  . cholecalciferol  2,000 Units Oral Daily  . clopidogrel  75 mg Oral Daily  . donepezil  10 mg Oral q morning - 10a  . famotidine  20 mg Intravenous Once  . ferrous sulfate  325 mg Oral Q breakfast  . gi cocktail  30 mL Oral Once  . multivitamin with minerals  1 tablet Oral Daily  . nitroGLYCERIN  1 inch Topical Once  . pantoprazole  40 mg Oral Daily  . phytonadione  5 mg Oral Once  . potassium chloride SA  20 mEq Oral Daily  . predniSONE  2 mg Oral Daily  . rOPINIRole  0.5 mg Oral Custom  . rOPINIRole  1 mg Oral Custom  . sodium chloride  3 mL Intravenous Q12H  . vancomycin  750 mg Intravenous Q24H  . vancomycin  1,000 mg Intravenous Once  . Warfarin - Pharmacist Dosing Inpatient   Does not apply q1800  . DISCONTD: ferrous sulfate  325 mg Oral Q breakfast  . DISCONTD: rOPINIRole  0.5-1 mg Oral QID  . DISCONTD: warfarin  2.5 mg Oral q1800   Assessment: 78 yo who was admitted for CP and cellulitis. She has had long standing CAD and no further  workup per cards. In the ED she was also found to have cellulitis in her axillary region. Now coumadin is on hold for likely I&D. She did get 5mg  of vit k last night. Expect INR to decrease tomorrow.   Goal of Therapy:  Vancomycin trough level 10-15 mcg/ml INR = 2-3  Plan:  Hold coumadin Vanc 750mg  IV q24 Daily INR  Ulyses Southward Pala 06/10/2012,8:13 AM

## 2012-06-11 DIAGNOSIS — L0291 Cutaneous abscess, unspecified: Secondary | ICD-10-CM

## 2012-06-11 DIAGNOSIS — R29898 Other symptoms and signs involving the musculoskeletal system: Secondary | ICD-10-CM

## 2012-06-11 DIAGNOSIS — I10 Essential (primary) hypertension: Secondary | ICD-10-CM

## 2012-06-11 LAB — PROTIME-INR: Prothrombin Time: 16.2 seconds — ABNORMAL HIGH (ref 11.6–15.2)

## 2012-06-11 LAB — PREPARE FRESH FROZEN PLASMA: Unit division: 0

## 2012-06-11 MED ORDER — HOME MED STORE IN PYXIS: 1.0000 | Freq: Every evening | Status: DC | PRN

## 2012-06-11 NOTE — Progress Notes (Signed)
[facility] PROGRESS NOTE  LESSLY STIGLER ZDG:[id number] DOB: 1920-12-28 DOA: 06/09/2012 PCP: [name], MD  Assessment/Plan: Active Problems:  VTE (venous thromboembolism)  HTN (hypertension)  Chest pain  Cellulitis  Weakness of both legs  1. UTI- partially treated with doxy- see if culture grows anything (on abx for abscess) 2. Chest pain/epigastric pain- negative CE, tele, at last visit to cardiologist he noted that there was nothing else to be done, needs to continue coumadin and plavix, pain better with GI cockatil 3. Axilla abscess/cellulitis and R labial abscess- tender, ? Need for I/D- appreciate surgery's assistance (complicating factor is that patient is on coumadin- given vit K and FFP), vanc/rocephin- I&D tomm in OR 4. Weakness- PT eval- SNF vs home with home health- may improve once infectious process treated    Code Status: full Family Communication: son at bedside Disposition Plan:  Home for SNF    HPI/Subjective: Patient c/o right labial abscess as well- says getting bigger did not get sleeping pill til late last night so tired this AM   Objective: Filed Vitals:   06/10/12 2324 06/10/12 2353 06/11/12 0550 06/11/12 0908  BP: 188/84 130/65 151/84 159/77  Pulse:  84 68 69  Temp:   98.8 F (37.1 C)   TempSrc:   Oral   Resp:   18   Height:      Weight:      SpO2:   96% 99%    Intake/Output Summary (Last 24 hours) at 06/11/12 1223 Last data filed at 06/11/12 0813  Gross per 24 hour  Intake 1390.5 ml  Output      0 ml  Net 1390.5 ml    Exam:   General:  Pleasant and cooperative  Cardiovascular: rrr  Respiratory: clear anteriorly  Abdomen: +BS, soft, NT/ND  SKin: L arm pit with area of abscess and R labia has smaller area of possible abscess  Data Reviewed: Basic Metabolic Panel:  Lab 06/10/12 5956 06/09/12 1045  NA 142 137  K 3.9 3.8  CL 108 105  CO2 23 22  GLUCOSE 98 120*  BUN 8 11  CREATININE 0.67 0.81  CALCIUM  8.6 9.0  MG -- --  PHOS -- --   Liver Function Tests:  Lab 06/10/12 0535  AST 14  ALT 10  ALKPHOS 47  BILITOT 0.2*  PROT 5.9*  ALBUMIN 2.7*   No results found for this basename: LIPASE:5,AMYLASE:5 in the last 168 hours No results found for this basename: AMMONIA:5 in the last 168 hours CBC:  Lab 06/09/12 1045  WBC 10.0  NEUTROABS --  HGB 11.6*  HCT 36.0  MCV 84.9  PLT 230   Cardiac Enzymes:  Lab 06/10/12 0726 06/09/12 2300 06/09/12 1555 06/09/12 1418  CKTOTAL 294* 298* 306* 318*  CKMB 1.7 2.2 1.9 2.1  CKMBINDEX -- -- -- --  TROPONINI <0.30 <0.30 <0.30 <0.30   BNP (last 3 results)  Basename 06/09/12 1054 03/12/12 1351 03/02/12 1423  PROBNP 245.0 101.6 105.4   CBG: No results found for this basename: GLUCAP:5 in the last 168 hours  Recent Results (from the past 240 hour(s))  URINE CULTURE     Status: Normal (Preliminary result)   Collection Time   06/09/12 12:12 PM      Component Value Range Status Comment   Specimen Description URINE, CLEAN CATCH   Final    Special Requests NONE   Final    Culture  Setup Time 06/10/2012 01:58   Final    Colony  Count PENDING   Incomplete    Culture Culture reincubated for better growth   Final    Report Status PENDING   Incomplete   MRSA PCR SCREENING     Status: Normal   Collection Time   06/10/12  8:07 PM      Component Value Range Status Comment   MRSA by PCR NEGATIVE  NEGATIVE Final      Studies: Dg Chest 2 View  05/25/2012  *RADIOLOGY REPORT*  Clinical Data: Weakness.  Hypotension.  CHEST - 2 VIEW  Comparison: 03/12/2012.  Findings: Coronary artery stent is noted on the lateral view. Linear scarring or atelectasis is present at the left lung base which appears similar to prior.  Elevation of the right hemidiaphragm appears chronic.  There is no airspace disease.  No effusion.  Cardiopericardial silhouette is within normal limits. Chronic T9 and T12 compression fractures evident on the lateral view.  IMPRESSION: No active  cardiopulmonary disease.  No interval change from prior.  Original Report Authenticated By: Andreas Newport, M.D.   Ct Chest W Contrast  06/09/2012  *RADIOLOGY REPORT*  Clinical Data: Chest pain, epigastric pain  CT CHEST WITH CONTRAST  Technique:  Multidetector CT imaging of the chest was performed following the standard protocol during bolus administration of intravenous contrast.  Contrast: OMNIPAQUE IOHEXOL 300 MG/ML  SOLN  Comparison: CT 02/02/2012  Findings: No axillary or supraclavicular lymphadenopathy.  No mediastinal or hilar lymphadenopathy.  No pericardial fluid. Coronary stents are noted.  Esophagus is normal.  Review of the lung parenchyma demonstrates linear atelectasis at the right lung base.  No pleural fluid, pneumonia, or pneumothorax. Airways are normal.  Limited view of the upper abdomen is unremarkable.  Limited view of the skeleton again demonstrates mild compression fractures of the lower thoracic spine pain, unchanged from prior.  IMPRESSION:  1.  No acute thoracic findings. 2.  Chronic compression fracture lower thoracic spine.  Original Report Authenticated By: Genevive Bi, M.D.   Chest Portable 1 View  06/09/2012  *RADIOLOGY REPORT*  Clinical Data: Chest pain, shortness of breath.  PORTABLE CHEST - 1 VIEW  Comparison: 05/25/2012  Findings: Stable elevation of the right hemidiaphragm.  Heart is borderline in size.  Lungs are clear.  No effusions or acute bony abnormality.  IMPRESSION: No acute cardiopulmonary disease.  Original Report Authenticated By: Cyndie Chime, M.D.    Scheduled Meds:    . amLODipine  10 mg Oral Daily  . atorvastatin  10 mg Oral QHS  . cefTRIAXone (ROCEPHIN)  IV  1 g Intravenous Q24H  . cholecalciferol  2,000 Units Oral Daily  . cholestyramine light  4 g Oral BID  . clopidogrel  75 mg Oral Daily  . donepezil  10 mg Oral q morning - 10a  . ferrous sulfate  325 mg Oral Q breakfast  . hydrALAZINE      . multivitamin with minerals  1 tablet  Oral Daily  . pantoprazole  40 mg Oral Daily  . potassium chloride SA  20 mEq Oral Daily  . predniSONE  2 mg Oral Daily  . rOPINIRole  0.5 mg Oral Custom  . rOPINIRole  1 mg Oral Custom  . sodium chloride  3 mL Intravenous Q12H  . vancomycin  750 mg Intravenous Q24H  . Warfarin - Pharmacist Dosing Inpatient   Does not apply q1800  . zaleplon  10 mg Oral QHS   Continuous Infusions:   Active Problems:  VTE (venous thromboembolism)  HTN (hypertension)  Chest pain  Cellulitis  Weakness of both legs    Time spent: 35    Marlin Canary  [facility] Pager [phone] 06/11/2012, 12:23 PM  LOS: 2 days

## 2012-06-11 NOTE — Progress Notes (Addendum)
Clinical Social Work Department CLINICAL SOCIAL WORK PLACEMENT NOTE 06/11/2012  Patient:  [name] [name], [name] [name]  Account Number:  [id number] Admit date:  06/09/2012  Clinical Social Worker: Lia Foyer ,  Date/time:  06/11/2012 12:00 M  Clinical Social Work is seeking post-discharge placement for this patient at the following level of care:   SKILLED NURSING   (*CSW will update this form in Epic as items are completed)   06/11/2012  Patient/family provided with [facility] Department of Clinical Social Work's list of facilities offering this level of care within the geographic area requested by the patient (or if unable, by the patient's family).  06/11/2012  Patient/family informed of their freedom to choose among providers that offer the needed level of care, that participate in Medicare, Medicaid or managed care program needed by the patient, have an available bed and are willing to accept the patient.  06/11/2012  Patient/family informed of [facility]' ownership interest in [facility], as well as of the fact that they are under no obligation to receive care at this facility.  08/11/2013PASARR submitted to EDS on  08/11/2013PASARR number received from EDS on   8/11/2013FL2 transmitted to all facilities in geographic area requested by pt/family on   FL2 transmitted to all facilities within larger geographic area on   Patient informed that his/her managed care company has contracts with or will negotiate with  certain facilities, including the following:     Patient/family informed of bed offers received:   Patient chooses bed at  Physician recommends and patient chooses bed at    Patient to be transferred to  on   Patient to be transferred to facility by   The following physician request were entered in Epic: *[facility] in [place] is number 1 choice.  Lia Foyer, LCSWA [facility] Clinical Social Worker Contact #: [phone] (weekend)

## 2012-06-11 NOTE — Progress Notes (Signed)
Pt has no VTE, coumadin on hold for I&D in am.  NP paged to confirm no VTE.  No new orders.  Will continue to monitor.

## 2012-06-11 NOTE — Progress Notes (Signed)
ANTICOAGULATION CONSULT NOTE - Follow Up Consult  Pharmacy Consult for coumadin Indication: DVT/PE  Allergies  Allergen Reactions  . Codeine Phosphate Nausea And Vomiting  . Darvocet (Propoxyphene-Acetaminophen) Nausea And Vomiting  . Promethazine Hcl Nausea And Vomiting  . Sulfamethoxazole Other (See Comments)    Pt doesn't remember reaction  . Amoxicillin Nausea And Vomiting and Rash  . Penicillins Nausea And Vomiting and Rash    Patient Measurements: Height: 5\' 1"  (154.9 cm) Weight: 148 lb 14.4 oz (67.541 kg) IBW/kg (Calculated) : 47.8    Vital Signs: Temp: 98.8 F (37.1 C) (08/11 0550) Temp src: Oral (08/11 0550) BP: 159/77 mmHg (08/11 0908) Pulse Rate: 69  (08/11 0908)  Labs:  [name] [name] 06/11/12 0556 06/10/12 2021 06/10/12 0726 06/10/12 0535 06/09/12 2300 06/09/12 1555 06/09/12 1045  HGB -- -- -- -- -- -- 11.6*  HCT -- -- -- -- -- -- 36.0  PLT -- -- -- -- -- -- 230  APTT -- -- -- -- -- -- --  LABPROT 16.2* 16.2* -- 31.4* -- -- --  INR 1.27 1.27 -- 2.97* -- -- --  HEPARINUNFRC -- -- -- -- -- -- --  CREATININE -- -- -- 0.67 -- -- 0.81  CKTOTAL -- -- 294* -- 298* 306* --  CKMB -- -- 1.7 -- 2.2 1.9 --  TROPONINI -- -- <0.30 -- <0.30 <0.30 --    Estimated Creatinine Clearance: 42.7 ml/min (by C-G formula based on Cr of 0.67).   Medications:  Scheduled:    . amLODipine  10 mg Oral Daily  . atorvastatin  10 mg Oral QHS  . cefTRIAXone (ROCEPHIN)  IV  1 g Intravenous Q24H  . cholecalciferol  2,000 Units Oral Daily  . cholestyramine light  4 g Oral BID  . clopidogrel  75 mg Oral Daily  . donepezil  10 mg Oral q morning - 10a  . ferrous sulfate  325 mg Oral Q breakfast  . hydrALAZINE      . multivitamin with minerals  1 tablet Oral Daily  . pantoprazole  40 mg Oral Daily  . potassium chloride SA  20 mEq Oral Daily  . predniSONE  2 mg Oral Daily  . rOPINIRole  0.5 mg Oral Custom  . rOPINIRole  1 mg Oral Custom  . sodium chloride  3 mL Intravenous Q12H  .  vancomycin  750 mg Intravenous Q24H  . Warfarin - Pharmacist Dosing Inpatient   Does not apply q1800  . zaleplon  10 mg Oral QHS    Assessment:  78 y.o female admitted for CP and found to have cellulitis on coumadin and vancomycin. Coumadin is currently on hold for planned I&D. INR is 1.27 after vit k 5mg  and FFP.   Goal of Therapy:  INR 2-3 Monitor platelets by anticoagulation protocol: Yes   Plan:  Continue to hold coumadin F/u plans for I&D F/u daily INR  Bola A. Wandra Feinstein D Clinical Pharmacist Pager:[phone] Phone [phone] 06/11/2012 9:16 AM

## 2012-06-11 NOTE — Progress Notes (Signed)
Patient ID: [name] [name], female   DOB: 08/11/1921, 78 y.o.   MRN: [id number] Patient is eating lunch INR 1.27 Has some discomfort to palpation in the Left axilla Area of swelling/ induration about 6 cm across  NPO p MN Will plan on I&D in OR tomorrow.  Wilmon Arms. Corliss Skains, MD, [facility] Surgery  06/11/2012 11:56 AM

## 2012-06-11 NOTE — Progress Notes (Signed)
Pt BP 188/84 with HR 80's, INR decreased 1.27 after FFP, NP with [facility] paged and notified.  PRN hydralazine ordered, no new orders received for VTE regarding decreased INR.  Will continue to monitor.

## 2012-06-11 NOTE — Progress Notes (Signed)
Clinical Social Work Department BRIEF PSYCHOSOCIAL ASSESSMENT 06/11/2012  Patient:  [name] [name], [name] [name]     Account Number:  [id number]     Admit date:  06/09/2012  Clinical Social Worker: Lia Foyer, Theresia Majors ,  Date/Time:  06/11/2012 09:45 AM  Referred by:  Physician  Date Referred:  06/10/2012 Referred for  SNF Placement   Interview type:  Family  PSYCHOSOCIAL DATA Living Status:  WITH ADULT CHILDREN   Primary support name:  [name] [name] Primary support relationship to patient:  CHILD, ADULT Degree of support available:   Vested, active in communication with treatment team. Participated in psychosocial assessment.    CURRENT CONCERNS Current Concerns  Post-Acute Placement    SOCIAL WORK ASSESSMENT / PLAN CSW consulted by physician for a skilled nursing placement. CSW contacted the patient's HCPOA, due to patient's memory impairment. CSW provided supportive counseling to the son. The son enaged appropriately and stated he thought the SNF placement was the best decision for the patient. The CSW informed the son of the SNF process and stated that a weekday CSW would follow up with him on bed offers.   Assessment/plan status:  Information/Referral to [facility]  PATIENT'S/FAMILY'S RESPONSE TO PLAN OF CARE: Son was agreeable and thanked CSW for faciliating SNF placement process.    Lia Foyer, LCSWA [facility] Clinical Social Worker Contact #: [phone] (weekend)

## 2012-06-12 ENCOUNTER — Encounter (HOSPITAL_COMMUNITY): Payer: Self-pay | Admitting: Anesthesiology

## 2012-06-12 ENCOUNTER — Inpatient Hospital Stay (HOSPITAL_COMMUNITY): Payer: Medicare Other | Admitting: Anesthesiology

## 2012-06-12 ENCOUNTER — Encounter (HOSPITAL_COMMUNITY)
Admission: EM | Disposition: A | Discharge status: Skilled Nursing Facility | Payer: Self-pay | Source: Home / Self Care | Attending: Internal Medicine

## 2012-06-12 DIAGNOSIS — I2510 Atherosclerotic heart disease of native coronary artery without angina pectoris: Secondary | ICD-10-CM

## 2012-06-12 SURGERY — INCISION AND DRAINAGE, ABSCESS
Anesthesia: General | Wound class: Dirty or Infected

## 2012-06-12 MED ORDER — WARFARIN SODIUM 2.5 MG PO TABS
2.5000 mg | ORAL_TABLET | Freq: Once | ORAL | Status: DC
  Filled 2012-06-12: qty 1

## 2012-06-12 MED ORDER — HYDROMORPHONE HCL PF 1 MG/ML IJ SOLN
INTRAMUSCULAR | Status: AC
  Filled 2012-06-12: qty 1

## 2012-06-12 MED ORDER — PROPOFOL 10 MG/ML IV EMUL
INTRAVENOUS | Status: DC | PRN
  Administered 2012-06-12: 50 mg via INTRAVENOUS

## 2012-06-12 MED ORDER — HYDROMORPHONE HCL PF 1 MG/ML IJ SOLN
INTRAMUSCULAR | Status: AC
  Administered 2012-06-12: 0.5 mg via INTRAVENOUS
  Filled 2012-06-12: qty 1

## 2012-06-12 MED ORDER — FENTANYL CITRATE 0.05 MG/ML IJ SOLN
INTRAMUSCULAR | Status: DC | PRN
  Administered 2012-06-12: 50 ug via INTRAVENOUS

## 2012-06-12 MED ORDER — HYDROMORPHONE HCL PF 1 MG/ML IJ SOLN
0.2500 mg | INTRAMUSCULAR | Status: DC | PRN
  Administered 2012-06-12 (×4): 0.5 mg via INTRAVENOUS

## 2012-06-12 MED ORDER — ONDANSETRON HCL 4 MG/2ML IJ SOLN
INTRAMUSCULAR | Status: DC | PRN
  Administered 2012-06-12: 4 mg via INTRAVENOUS

## 2012-06-12 MED ORDER — WARFARIN SODIUM 5 MG PO TABS
5.0000 mg | ORAL_TABLET | Freq: Once | ORAL | Status: AC
  Administered 2012-06-12: 5 mg via ORAL
  Filled 2012-06-12: qty 1

## 2012-06-12 MED ORDER — LIDOCAINE HCL (CARDIAC) 20 MG/ML IV SOLN
INTRAVENOUS | Status: DC | PRN
  Administered 2012-06-12: 80 mg via INTRAVENOUS

## 2012-06-12 MED ORDER — LACTATED RINGERS IV SOLN
INTRAVENOUS | Status: DC | PRN
  Administered 2012-06-12: 10:00:00 via INTRAVENOUS

## 2012-06-12 MED ORDER — BUPIVACAINE-EPINEPHRINE PF 0.25-1:200000 % IJ SOLN
INTRAMUSCULAR | Status: AC
  Filled 2012-06-12: qty 30

## 2012-06-12 MED ORDER — ONDANSETRON HCL 4 MG/2ML IJ SOLN: 4.0000 mg | Freq: Once | INTRAMUSCULAR | Status: DC | PRN

## 2012-06-12 MED ORDER — HYDRALAZINE HCL 20 MG/ML IJ SOLN: 10.0000 mg | Freq: Once | INTRAMUSCULAR | Status: DC

## 2012-06-12 MED ORDER — BUPIVACAINE-EPINEPHRINE 0.25% -1:200000 IJ SOLN
INTRAMUSCULAR | Status: DC | PRN
  Administered 2012-06-12: 10 mL

## 2012-06-12 SURGICAL SUPPLY — 33 items
BANDAGE GAUZE ELAST BULKY 4 IN (GAUZE/BANDAGES/DRESSINGS) ×1 IMPLANT
CANISTER SUCTION 2500CC (MISCELLANEOUS) ×2 IMPLANT
CLOTH BEACON ORANGE TIMEOUT ST (SAFETY) ×2 IMPLANT
COVER MAYO STAND STRL (DRAPES) ×1 IMPLANT
COVER SURGICAL LIGHT HANDLE (MISCELLANEOUS) ×3 IMPLANT
DRAPE LAPAROSCOPIC ABDOMINAL (DRAPES) ×2 IMPLANT
DRAPE UTILITY 15X26 W/TAPE STR (DRAPE) ×4 IMPLANT
DRSG PAD ABDOMINAL 8X10 ST (GAUZE/BANDAGES/DRESSINGS) IMPLANT
ELECT CAUTERY BLADE 6.4 (BLADE) ×2 IMPLANT
ELECT REM PT RETURN 9FT ADLT (ELECTROSURGICAL) ×2
ELECTRODE REM PT RTRN 9FT ADLT (ELECTROSURGICAL) ×1 IMPLANT
GLOVE EUDERMIC 7 POWDERFREE (GLOVE) ×2 IMPLANT
GOWN STRL NON-REIN LRG LVL3 (GOWN DISPOSABLE) ×2 IMPLANT
GOWN STRL REIN XL XLG (GOWN DISPOSABLE) ×2 IMPLANT
KIT BASIN OR (CUSTOM PROCEDURE TRAY) ×2 IMPLANT
KIT ROOM TURNOVER OR (KITS) ×2 IMPLANT
LEGGING LITHOTOMY PAIR STRL (DRAPES) ×1 IMPLANT
NDL 18GX1X1/2 (RX/OR ONLY) (NEEDLE) IMPLANT
NDL HYPO 25GX1X1/2 BEV (NEEDLE) IMPLANT
NEEDLE 18GX1X1/2 (RX/OR ONLY) (NEEDLE) ×2 IMPLANT
NEEDLE HYPO 25GX1X1/2 BEV (NEEDLE) ×2 IMPLANT
NS IRRIG 1000ML POUR BTL (IV SOLUTION) ×2 IMPLANT
PACK GENERAL/GYN (CUSTOM PROCEDURE TRAY) ×2 IMPLANT
PAD ARMBOARD 7.5X6 YLW CONV (MISCELLANEOUS) ×4 IMPLANT
SPECIMEN JAR SMALL (MISCELLANEOUS) IMPLANT
SPONGE GAUZE 4X4 12PLY (GAUZE/BANDAGES/DRESSINGS) ×1 IMPLANT
SWAB COLLECTION DEVICE MRSA (MISCELLANEOUS) ×1 IMPLANT
SYR CONTROL 10ML LL (SYRINGE) ×1 IMPLANT
SYRINGE 10CC LL (SYRINGE) ×1 IMPLANT
TAPE CLOTH SURG 6X10 WHT LF (GAUZE/BANDAGES/DRESSINGS) ×1 IMPLANT
TOWEL OR 17X24 6PK STRL BLUE (TOWEL DISPOSABLE) ×2 IMPLANT
TOWEL OR 17X26 10 PK STRL BLUE (TOWEL DISPOSABLE) ×2 IMPLANT
TUBE ANAEROBIC SPECIMEN COL (MISCELLANEOUS) ×1 IMPLANT

## 2012-06-12 NOTE — Anesthesia Postprocedure Evaluation (Signed)
  Anesthesia Post-op Note  Patient: [name] [name]  Procedure(s) Performed: Procedure(s) (LRB): INCISION AND DRAINAGE ABSCESS (N/A)  Patient Location: PACU  Anesthesia Type: General  Level of Consciousness: awake, alert  and sedated  Airway and Oxygen Therapy: Patient Spontanous Breathing and Patient connected to nasal cannula oxygen  Post-op Pain: mild  Post-op Assessment: Post-op Vital signs reviewed  Post-op Vital Signs: Reviewed  Complications: No apparent anesthesia complications

## 2012-06-12 NOTE — Anesthesia Preprocedure Evaluation (Addendum)
Anesthesia Evaluation  Patient identified by MRN, date of birth, ID band Patient awake    Reviewed: Allergy & Precautions, H&P , NPO status , Patient's Chart, lab work & pertinent test results  Airway Mallampati: II TM Distance: >3 FB Neck ROM: Full    Dental  (+) Teeth Intact, Dental Advisory Given and Missing   Pulmonary  breath sounds clear to auscultation  Pulmonary exam normal       Cardiovascular hypertension, Pt. on medications + angina with exertion + CAD Rhythm:Regular Rate:Normal  Multiple stents, location unclear.   Neuro/Psych    GI/Hepatic negative GI ROS, Neg liver ROS,   Endo/Other  negative endocrine ROS  Renal/GU negative Renal ROS     Musculoskeletal   Abdominal (+) + obese,  Abdomen: soft.    Peds  Hematology negative hematology ROS (+)   Anesthesia Other Findings   Reproductive/Obstetrics                          Anesthesia Physical Anesthesia Plan  ASA: III  Anesthesia Plan: General   Post-op Pain Management:    Induction: Intravenous  Airway Management Planned: LMA  Additional Equipment:   Intra-op Plan:   Post-operative Plan: Extubation in OR  Informed Consent: I have reviewed the patients History and Physical, chart, labs and discussed the procedure including the risks, benefits and alternatives for the proposed anesthesia with the patient or authorized representative who has indicated his/her understanding and acceptance.   Dental advisory given  Plan Discussed with: CRNA, Anesthesiologist and Surgeon  Anesthesia Plan Comments:        Anesthesia Quick Evaluation

## 2012-06-12 NOTE — Progress Notes (Signed)
  Subjective: No complaints  Objective: Vital signs in last 24 hours: Temp:  [97.5 F (36.4 C)-98.5 F (36.9 C)] 98.5 F (36.9 C) (08/12 0619) Pulse Rate:  [52-69] 52  (08/12 0619) Resp:  [18] 18  (08/12 0619) BP: (128-159)/(73-77) 128/73 mmHg (08/12 0619) SpO2:  [97 %-99 %] 98 % (08/12 0619) Last BM Date: 06/11/12  Intake/Output from previous day: 08/11 0701 - 08/12 0700 In: 630 [P.O.:480; IV Piggyback:150] Out: -  Intake/Output this shift:    Breasts: left axillary abscess  Lab Results:   Basename 06/09/12 1045  WBC 10.0  HGB 11.6*  HCT 36.0  PLT 230   BMET  Basename 06/10/12 0535 06/09/12 1045  NA 142 137  K 3.9 3.8  CL 108 105  CO2 23 22  GLUCOSE 98 120*  BUN 8 11  CREATININE 0.67 0.81  CALCIUM 8.6 9.0   PT/INR  Basename 06/11/12 0556 06/10/12 2021  LABPROT 16.2* 16.2*  INR 1.27 1.27   ABG No results found for this basename: PHART:2,PCO2:2,PO2:2,HCO3:2 in the last 72 hours  Studies/Results: No results found.  Anti-infectives: Anti-infectives     Start     Dose/Rate Route Frequency Ordered Stop   06/10/12 2200   vancomycin (VANCOCIN) 750 mg in sodium chloride 0.9 % 150 mL IVPB        750 mg 150 mL/hr over 60 Minutes Intravenous Every 24 hours 06/09/12 1549     06/09/12 1700   cefTRIAXone (ROCEPHIN) 1 g in dextrose 5 % 50 mL IVPB        1 g 100 mL/hr over 30 Minutes Intravenous Every 24 hours 06/09/12 1525     06/09/12 1345   vancomycin (VANCOCIN) IVPB 1000 mg/200 mL premix        1,000 mg 200 mL/hr over 60 Minutes Intravenous  Once 06/09/12 1338 06/09/12 2303          Assessment/Plan: s/p Procedure(s) (LRB): INCISION AND DRAINAGE ABSCESS (N/A) Plan for I+D today.  Will also look at labial area Risks and benefits of surgery discussed with pt and she understands and wishes to proceed  LOS: 3 days    TOTH III,Hannan Hutmacher S 06/12/2012

## 2012-06-12 NOTE — Progress Notes (Signed)
[facility] PROGRESS NOTE  [name] [name] JYN:[id number] DOB: April 11, 1921 DOA: 06/09/2012 PCP: [name], MD  Assessment/Plan: Active Problems:  VTE (venous thromboembolism)  HTN (hypertension)  Chest pain  Cellulitis  Weakness of both legs  Abscess  1. UTI- partially treated with doxy- see if culture grows anything (on abx for abscess) 2. Chest pain/epigastric pain- negative CE, tele, at last visit to cardiologist he noted that there was nothing else to be done, needs to continue coumadin and plavix, pain better with GI cockatil 3. Axilla abscess/cellulitis and R labial abscess- tender, ? Need for I/D- appreciate surgery's assistance (complicating factor is that patient is on coumadin- given vit K and FFP), vanc for I&D today in OR- hope to start back coumadin very soon after 4. Weakness- PT eval- SNF     Code Status: full Family Communication: son at bedside Disposition Plan:  SNF    HPI/Subjective: Patient with no new c/o today, no SOB, no CP, no fever, no chills Sat up yesterday   Objective: Filed Vitals:   06/11/12 0908 06/11/12 1400 06/11/12 2100 06/12/12 0619  BP: 159/77 150/76 133/76 128/73  Pulse: 69 62 56 52  Temp:  97.5 F (36.4 C) 98.4 F (36.9 C) 98.5 F (36.9 C)  TempSrc:  Oral Oral Oral  Resp:  18 18 18   Height:      Weight:      SpO2: 99% 97% 98% 98%    Intake/Output Summary (Last 24 hours) at 06/12/12 0848 Last data filed at 06/11/12 2123  Gross per 24 hour  Intake    390 ml  Output      0 ml  Net    390 ml    Exam:   General:  Pleasant and cooperative  Cardiovascular: rrr  Respiratory: clear anteriorly  Abdomen: +BS, soft, NT/ND  SKin: L arm pit with area of abscess and R labia has smaller area of possible abscess  Data Reviewed: Basic Metabolic Panel:  Lab 06/10/12 8657 06/09/12 1045  NA 142 137  K 3.9 3.8  CL 108 105  CO2 23 22  GLUCOSE 98 120*  BUN 8 11  CREATININE 0.67 0.81  CALCIUM 8.6 9.0  MG --  --  PHOS -- --   Liver Function Tests:  Lab 06/10/12 0535  AST 14  ALT 10  ALKPHOS 47  BILITOT 0.2*  PROT 5.9*  ALBUMIN 2.7*   No results found for this basename: LIPASE:5,AMYLASE:5 in the last 168 hours No results found for this basename: AMMONIA:5 in the last 168 hours CBC:  Lab 06/09/12 1045  WBC 10.0  NEUTROABS --  HGB 11.6*  HCT 36.0  MCV 84.9  PLT 230   Cardiac Enzymes:  Lab 06/10/12 0726 06/09/12 2300 06/09/12 1555 06/09/12 1418  CKTOTAL 294* 298* 306* 318*  CKMB 1.7 2.2 1.9 2.1  CKMBINDEX -- -- -- --  TROPONINI <0.30 <0.30 <0.30 <0.30   BNP (last 3 results)  Basename 06/09/12 1054 03/12/12 1351 03/02/12 1423  PROBNP 245.0 101.6 105.4   CBG: No results found for this basename: GLUCAP:5 in the last 168 hours  Recent Results (from the past 240 hour(s))  URINE CULTURE     Status: Normal (Preliminary result)   Collection Time   06/09/12 12:12 PM      Component Value Range Status Comment   Specimen Description URINE, CLEAN CATCH   Final    Special Requests NONE   Final    Culture  Setup Time 06/10/2012 01:58  Final    Colony Count PENDING   Incomplete    Culture Culture reincubated for better growth   Final    Report Status PENDING   Incomplete   MRSA PCR SCREENING     Status: Normal   Collection Time   06/10/12  8:07 PM      Component Value Range Status Comment   MRSA by PCR NEGATIVE  NEGATIVE Final      Studies: Dg Chest 2 View  05/25/2012  *RADIOLOGY REPORT*  Clinical Data: Weakness.  Hypotension.  CHEST - 2 VIEW  Comparison: 03/12/2012.  Findings: Coronary artery stent is noted on the lateral view. Linear scarring or atelectasis is present at the left lung base which appears similar to prior.  Elevation of the right hemidiaphragm appears chronic.  There is no airspace disease.  No effusion.  Cardiopericardial silhouette is within normal limits. Chronic T9 and T12 compression fractures evident on the lateral view.  IMPRESSION: No active cardiopulmonary  disease.  No interval change from prior.  Original Report Authenticated By: Andreas Newport, M.D.   Ct Chest W Contrast  06/09/2012  *RADIOLOGY REPORT*  Clinical Data: Chest pain, epigastric pain  CT CHEST WITH CONTRAST  Technique:  Multidetector CT imaging of the chest was performed following the standard protocol during bolus administration of intravenous contrast.  Contrast: OMNIPAQUE IOHEXOL 300 MG/ML  SOLN  Comparison: CT 02/02/2012  Findings: No axillary or supraclavicular lymphadenopathy.  No mediastinal or hilar lymphadenopathy.  No pericardial fluid. Coronary stents are noted.  Esophagus is normal.  Review of the lung parenchyma demonstrates linear atelectasis at the right lung base.  No pleural fluid, pneumonia, or pneumothorax. Airways are normal.  Limited view of the upper abdomen is unremarkable.  Limited view of the skeleton again demonstrates mild compression fractures of the lower thoracic spine pain, unchanged from prior.  IMPRESSION:  1.  No acute thoracic findings. 2.  Chronic compression fracture lower thoracic spine.  Original Report Authenticated By: Genevive Bi, M.D.   Chest Portable 1 View  06/09/2012  *RADIOLOGY REPORT*  Clinical Data: Chest pain, shortness of breath.  PORTABLE CHEST - 1 VIEW  Comparison: 05/25/2012  Findings: Stable elevation of the right hemidiaphragm.  Heart is borderline in size.  Lungs are clear.  No effusions or acute bony abnormality.  IMPRESSION: No acute cardiopulmonary disease.  Original Report Authenticated By: Cyndie Chime, M.D.    Scheduled Meds:    . amLODipine  10 mg Oral Daily  . atorvastatin  10 mg Oral QHS  . cefTRIAXone (ROCEPHIN)  IV  1 g Intravenous Q24H  . cholecalciferol  2,000 Units Oral Daily  . cholestyramine light  4 g Oral BID  . clopidogrel  75 mg Oral Daily  . donepezil  10 mg Oral q morning - 10a  . ferrous sulfate  325 mg Oral Q breakfast  . hydrALAZINE      . multivitamin with minerals  1 tablet Oral Daily  .  pantoprazole  40 mg Oral Daily  . potassium chloride SA  20 mEq Oral Daily  . predniSONE  2 mg Oral Daily  . rOPINIRole  0.5 mg Oral Custom  . rOPINIRole  1 mg Oral Custom  . sodium chloride  3 mL Intravenous Q12H  . vancomycin  750 mg Intravenous Q24H  . Warfarin - Pharmacist Dosing Inpatient   Does not apply q1800  . zaleplon  10 mg Oral QHS   Continuous Infusions:   Active Problems:  VTE (venous  thromboembolism)  HTN (hypertension)  Chest pain  Cellulitis  Weakness of both legs  Abscess    Time spent: 25    Marlin Canary  [facility] Pager [phone] 06/12/2012, 8:48 AM  LOS: 3 days

## 2012-06-12 NOTE — Progress Notes (Signed)
ANTICOAGULATION CONSULT NOTE - Follow Up Consult  Pharmacy Consult for coumadin Indication: DVT/PE  Allergies  Allergen Reactions  . Codeine Phosphate Nausea And Vomiting  . Darvocet (Propoxyphene-Acetaminophen) Nausea And Vomiting  . Promethazine Hcl Nausea And Vomiting  . Sulfamethoxazole Other (See Comments)    Pt doesn't remember reaction  . Amoxicillin Nausea And Vomiting and Rash  . Penicillins Nausea And Vomiting and Rash    Patient Measurements: Height: 5\' 1"  (154.9 cm) Weight: 148 lb 14.4 oz (67.541 kg) IBW/kg (Calculated) : 47.8    Vital Signs: Temp: 97.5 F (36.4 C) (08/12 1340) Temp src: Oral (08/12 0619) BP: 196/77 mmHg (08/12 1609) Pulse Rate: 66  (08/12 1609)  Labs:  Alvira Philips 06/11/12 0556 06/10/12 2021 06/10/12 0726 06/10/12 0535 06/09/12 2300  HGB -- -- -- -- --  HCT -- -- -- -- --  PLT -- -- -- -- --  APTT -- -- -- -- --  LABPROT 16.2* 16.2* -- 31.4* --  INR 1.27 1.27 -- 2.97* --  HEPARINUNFRC -- -- -- -- --  CREATININE -- -- -- 0.67 --  CKTOTAL -- -- 294* -- 298*  CKMB -- -- 1.7 -- 2.2  TROPONINI -- -- <0.30 -- <0.30    Estimated Creatinine Clearance: 42.7 ml/min (by C-G formula based on Cr of 0.67).   Medications:  Scheduled:     . amLODipine  10 mg Oral Daily  . atorvastatin  10 mg Oral QHS  . cholecalciferol  2,000 Units Oral Daily  . cholestyramine light  4 g Oral BID  . clopidogrel  75 mg Oral Daily  . donepezil  10 mg Oral q morning - 10a  . ferrous sulfate  325 mg Oral Q breakfast  . HYDROmorphone      . multivitamin with minerals  1 tablet Oral Daily  . pantoprazole  40 mg Oral Daily  . potassium chloride SA  20 mEq Oral Daily  . predniSONE  2 mg Oral Daily  . rOPINIRole  0.5 mg Oral Custom  . rOPINIRole  1 mg Oral Custom  . sodium chloride  3 mL Intravenous Q12H  . vancomycin  750 mg Intravenous Q24H  . warfarin  2.5 mg Oral ONCE-1800  . Warfarin - Pharmacist Dosing Inpatient   Does not apply q1800  . zaleplon  10 mg  Oral QHS  . DISCONTD: cefTRIAXone (ROCEPHIN)  IV  1 g Intravenous Q24H    Assessment:  78 yo female admitted for CP and found to have cellulitis/abscesses on vancomycin, and ceftriaxone.  On Coumadin for history of PE/DVT in 2011 and was held 8/10-8/11 for I&D today.  Of note, patient received Vit K 5 mg po and FFP for reversal.  INR was 1.27 yesterday.  Now to resume Coumadin post-op.     Goal of Therapy:  INR 2-3 Monitor platelets by anticoagulation protocol: Yes   Plan:  1.  Coumadin 5 mg po x 1 tonight (to try to overcome Vit K resistance), home dose was 2.5 mg po daily (and was therapeutic on it) 2.  Daily PT/INR   Rolland Porter, Pharm.D., BCPS Clinical Pharmacist Pager: [phone] 06/12/2012 4:15 PM

## 2012-06-12 NOTE — Clinical Social Work Placement (Addendum)
     Clinical Social Work Department CLINICAL SOCIAL WORK PLACEMENT NOTE 06/14/2012  Patient:  [name] [name], [name] [name]  Account Number:  [id number] Admit date:  06/09/2012  Clinical Social Worker:  Doree Albee  Date/time:  06/11/2012 04:00 PM  Clinical Social Work is seeking post-discharge placement for this patient at the following level of care:   SKILLED NURSING   (*CSW will update this form in Epic as items are completed)   06/11/2012  Patient/family provided with [facility] Department of Clinical Social Works list of facilities offering this level of care within the geographic area requested by the patient (or if unable, by the patients family).  06/11/2012  Patient/family informed of their freedom to choose among providers that offer the needed level of care, that participate in Medicare, Medicaid or managed care program needed by the patient, have an available bed and are willing to accept the patient.  06/11/2012  Patient/family informed of [facility] ownership interest in [facility], as well as of the fact that they are under no obligation to receive care at this facility.  PASARR submitted to EDS on 06/11/2012 PASARR number received from EDS on 06/11/2012  FL2 transmitted to all facilities in geographic area requested by pt/family on  06/11/2012 FL2 transmitted to all facilities within larger geographic area on   Patient informed that his/her managed care company has contracts with or will negotiate with  certain facilities, including the following:     Patient/family informed of bed offers received:  06/12/2012 Patient chooses bed at [facility] Physician recommends and patient chooses bed at    Patient to be transferred to [facility] on  06/14/2012 Patient to be transferred to facility by [facility]  The following physician request were entered in Epic:   Additional Comments:

## 2012-06-12 NOTE — Progress Notes (Signed)
CSW spoke with [facility] who gave patient authorization for short term rehab. CSW spoke with pt son who agreed with plan for short term rehab. CSW provided pt bed offers and pt son chose [facility]. CSW left message for [facility] to confirm bed availability. .Clinical social worker continuing to follow pt to assist with pt dc plans and further csw needs.   Catha Gosselin, Theresia Majors  [phone] .06/12/2012 1601pm

## 2012-06-12 NOTE — Progress Notes (Signed)
Utilization review completed.  

## 2012-06-12 NOTE — Transfer of Care (Signed)
Immediate Anesthesia Transfer of Care Note  Patient: [name] [name]  Procedure(s) Performed: Procedure(s) (LRB): INCISION AND DRAINAGE ABSCESS (N/A)  Patient Location: PACU  Anesthesia Type: General  Level of Consciousness: awake, alert  and oriented  Airway & Oxygen Therapy: Patient Spontanous Breathing and Patient connected to face mask oxygen  Post-op Assessment: Report given to PACU RN, Post -op Vital signs reviewed and stable, Patient moving all extremities and Patient moving all extremities X 4  Post vital signs: Reviewed and stable  Complications: No apparent anesthesia complications

## 2012-06-12 NOTE — Progress Notes (Signed)
PT CANCELLATION NOTE:  06/12/2012  PT cancelled secondary to pt unavailable (surgery).  Acute PT will follow-up tomorrow if pt still is appropriate.   Najmo Pardue L. Caera Enwright DPT [phone]

## 2012-06-12 NOTE — Plan of Care (Signed)
Problem: Phase I Progression Outcomes Goal: Aspirin unless contraindicated Outcome: Not Applicable Date Met:  06/12/12 On coumadin and plavix

## 2012-06-12 NOTE — Op Note (Signed)
06/09/2012 - 06/12/2012  11:06 AM  PATIENT:  [name] [name]  78 y.o. female  PRE-OPERATIVE DIAGNOSIS:  labial and axillary abscess  POST-OPERATIVE DIAGNOSIS:  labial and axillary abscess  PROCEDURE:  Procedure(s) (LRB): INCISION AND DRAINAGE ABSCESS (N/A)  SURGEON:  Surgeon(s) and Role:    * Robyne Askew, MD - Primary  PHYSICIAN ASSISTANT:   ASSISTANTS: none   ANESTHESIA:   general  EBL:  Total I/O In: 450 [I.V.:450] Out: -   BLOOD ADMINISTERED:none  DRAINS: none   LOCAL MEDICATIONS USED:  MARCAINE     SPECIMEN:  No Specimen  DISPOSITION OF SPECIMEN:  N/A  COUNTS:  YES  TOURNIQUET:  * No tourniquets in log *  DICTATION: .Dragon Dictation After informed consent was obtained the patient was brought to the operating room and placed in the supine position on the operating room table. The left axilla was prepped with ChloraPrep, allowed to dry, and draped in the usual sterile manner. The left axilla was then infiltrated with 4% Marcaine with epinephrine. There was a large area of induration and fullness in the left axilla. An incision was made into this area with a 15 blade knife. The incision was carried through the skin and subcutaneous tissue sharply with the electrocautery until the abscess cavity was entered. Cultures were obtained. The joint fluid was evacuated. Hemostasis was achieved using the Bovie electrocautery. Finger dissection was used to break up any loculations. Once the cavity was completely drained it was then packed with moistened Kerlix gauze. Sterile dressings were applied. The patient tolerated the procedure well. At the end of the case all needle sponge and instrument counts were correct. The patient was then awakened and taken to recovery in stable condition.  PLAN OF CARE: Admit to inpatient   PATIENT DISPOSITION:  PACU - hemodynamically stable.   Delay start of Pharmacological VTE agent (>24hrs) due to surgical blood loss or risk of bleeding:  no

## 2012-06-12 NOTE — Progress Notes (Signed)
Per discussion with pt insurance, pt is under review for skilled nursing authorization for short term rehab. CSW will provide bed offers to pt once we have received authorization for skilled nursing for rehab. .Clinical social worker continuing to follow pt to assist with pt dc plans and further csw needs.   Catha Gosselin, Theresia Majors  [phone] .06/12/2012 1508pm

## 2012-06-13 DIAGNOSIS — I498 Other specified cardiac arrhythmias: Secondary | ICD-10-CM

## 2012-06-13 DIAGNOSIS — R001 Bradycardia, unspecified: Secondary | ICD-10-CM

## 2012-06-13 HISTORY — DX: Bradycardia, unspecified: R00.1

## 2012-06-13 LAB — BASIC METABOLIC PANEL
BUN: 11 mg/dL (ref 6–23)
Calcium: 9.4 mg/dL (ref 8.4–10.5)
GFR calc Af Amer: 89 mL/min — ABNORMAL LOW (ref >90)
GFR calc non Af Amer: 77 mL/min — ABNORMAL LOW (ref >90)
Potassium: 4.2 mEq/L (ref 3.5–5.1)
Sodium: 137 mEq/L (ref 135–145)

## 2012-06-13 MED ORDER — WARFARIN SODIUM 5 MG PO TABS
5.0000 mg | ORAL_TABLET | Freq: Once | ORAL | Status: AC
  Administered 2012-06-13: 5 mg via ORAL
  Filled 2012-06-13: qty 1

## 2012-06-13 MED ORDER — CLINDAMYCIN HCL 300 MG PO CAPS
600.0000 mg | ORAL_CAPSULE | Freq: Three times a day (TID) | ORAL | Status: DC
  Administered 2012-06-13 – 2012-06-14 (×3): 600 mg via ORAL
  Filled 2012-06-13 (×6): qty 2

## 2012-06-13 NOTE — Progress Notes (Addendum)
[facility] PROGRESS NOTE  [name] [name]:[id number] DOB: 12-Sep-1921 DOA: 06/09/2012 PCP: [name], MD  Assessment/Plan: Active Problems:  HTN (hypertension)  Chest pain  Cellulitis  Weakness of both legs  Abscess  Bradycardia  1. UTI-  Strep (has been on doxy (outpatient), rocephin/vanc here 2. Chest pain/epigastric pain- negative CE, tele, at last visit to cardiologist he noted that there was nothing else to be done, needs to continue coumadin and plavix, pain better with GI cockatil 3. Axilla abscess/cellulitis and R labial abscess- tender, s/p I&D, change to PO clinda and monitor overnight, back on coumadin yesterday with goal of 2-3, wound care per surgery 4. Weakness- PT eval- SNF  5. Bradycardia at night- no rate lowering medications, has cardiologist (stuckey)-- if continues to have weakness after rehab may need to follow up for further evaluation 6. Dementia- at baseline 7. PMR- steroids    Code Status: full Family Communication: LM for son [name] Disposition Plan:  SNF (wed?)    HPI/Subjective: Had nausea after surgery- better today Ate breakfast No CP, no SOB   Objective: Filed Vitals:   06/12/12 2020 06/12/12 2100 06/13/12 0434 06/13/12 0500  BP: 136/69 148/63 156/66 154/51  Pulse: 58 57 56 57  Temp: 98.1 F (36.7 C) 97.8 F (36.6 C) 97.9 F (36.6 C) 98.3 F (36.8 C)  TempSrc:  Axillary  Oral  Resp: 18  19   Height:      Weight:      SpO2: 95% 99% 98% 97%    Intake/Output Summary (Last 24 hours) at 06/13/12 1023 Last data filed at 06/12/12 1045  Gross per 24 hour  Intake    450 ml  Output      0 ml  Net    450 ml    Exam:   General:  Pleasant and cooperative  Cardiovascular: rrr  Respiratory: clear anteriorly  Abdomen: +BS, soft, NT/ND  SKin: L arm pit with area of abscess and R labia has smaller area of possible abscess  Data Reviewed: Basic Metabolic Panel:  Lab 06/13/12 8119 06/10/12 0535 06/09/12 1045    NA 137 142 137  K 4.2 3.9 3.8  CL 104 108 105  CO2 25 23 22   GLUCOSE 91 98 120*  BUN 11 8 11   CREATININE 0.66 0.67 0.81  CALCIUM 9.4 8.6 9.0  MG -- -- --  PHOS -- -- --   Liver Function Tests:  Lab 06/10/12 0535  AST 14  ALT 10  ALKPHOS 47  BILITOT 0.2*  PROT 5.9*  ALBUMIN 2.7*   No results found for this basename: LIPASE:5,AMYLASE:5 in the last 168 hours No results found for this basename: AMMONIA:5 in the last 168 hours CBC:  Lab 06/09/12 1045  WBC 10.0  NEUTROABS --  HGB 11.6*  HCT 36.0  MCV 84.9  PLT 230   Cardiac Enzymes:  Lab 06/10/12 0726 06/09/12 2300 06/09/12 1555 06/09/12 1418  CKTOTAL 294* 298* 306* 318*  CKMB 1.7 2.2 1.9 2.1  CKMBINDEX -- -- -- --  TROPONINI <0.30 <0.30 <0.30 <0.30   BNP (last 3 results)  Basename 06/09/12 1054 03/12/12 1351 03/02/12 1423  PROBNP 245.0 101.6 105.4   CBG: No results found for this basename: GLUCAP:5 in the last 168 hours  Recent Results (from the past 240 hour(s))  URINE CULTURE     Status: Normal (Preliminary result)   Collection Time   06/09/12 12:12 PM      Component Value Range Status Comment   Specimen  Description URINE, CLEAN CATCH   Final    Special Requests NONE   Final    Culture  Setup Time 06/10/2012 01:58   Final    Colony Count >=100,000 COLONIES/ML   Final    Culture STREPTOCOCCUS GROUP D;high probability for S.bovis   Final    Report Status PENDING   Incomplete   MRSA PCR SCREENING     Status: Normal   Collection Time   06/10/12  8:07 PM      Component Value Range Status Comment   MRSA by PCR NEGATIVE  NEGATIVE Final   CULTURE, ROUTINE-ABSCESS     Status: Normal (Preliminary result)   Collection Time   06/12/12 10:53 AM      Component Value Range Status Comment   Specimen Description ABSCESS LEFT AXILLA   Final    Special Requests PT ON VANCO FLAGYL AND ROCEPHIN   Final    Gram Stain     Final    Value: NO WBC SEEN     NO SQUAMOUS EPITHELIAL CELLS SEEN     RARE GRAM POSITIVE COCCI IN  PAIRS   Culture PENDING   Incomplete    Report Status PENDING   Incomplete      Studies: Dg Chest 2 View  05/25/2012  *RADIOLOGY REPORT*  Clinical Data: Weakness.  Hypotension.  CHEST - 2 VIEW  Comparison: 03/12/2012.  Findings: Coronary artery stent is noted on the lateral view. Linear scarring or atelectasis is present at the left lung base which appears similar to prior.  Elevation of the right hemidiaphragm appears chronic.  There is no airspace disease.  No effusion.  Cardiopericardial silhouette is within normal limits. Chronic T9 and T12 compression fractures evident on the lateral view.  IMPRESSION: No active cardiopulmonary disease.  No interval change from prior.  Original Report Authenticated By: Andreas Newport, M.D.   Ct Chest W Contrast  06/09/2012  *RADIOLOGY REPORT*  Clinical Data: Chest pain, epigastric pain  CT CHEST WITH CONTRAST  Technique:  Multidetector CT imaging of the chest was performed following the standard protocol during bolus administration of intravenous contrast.  Contrast: OMNIPAQUE IOHEXOL 300 MG/ML  SOLN  Comparison: CT 02/02/2012  Findings: No axillary or supraclavicular lymphadenopathy.  No mediastinal or hilar lymphadenopathy.  No pericardial fluid. Coronary stents are noted.  Esophagus is normal.  Review of the lung parenchyma demonstrates linear atelectasis at the right lung base.  No pleural fluid, pneumonia, or pneumothorax. Airways are normal.  Limited view of the upper abdomen is unremarkable.  Limited view of the skeleton again demonstrates mild compression fractures of the lower thoracic spine pain, unchanged from prior.  IMPRESSION:  1.  No acute thoracic findings. 2.  Chronic compression fracture lower thoracic spine.  Original Report Authenticated By: Genevive Bi, M.D.   Chest Portable 1 View  06/09/2012  *RADIOLOGY REPORT*  Clinical Data: Chest pain, shortness of breath.  PORTABLE CHEST - 1 VIEW  Comparison: 05/25/2012  Findings: Stable elevation  of the right hemidiaphragm.  Heart is borderline in size.  Lungs are clear.  No effusions or acute bony abnormality.  IMPRESSION: No acute cardiopulmonary disease.  Original Report Authenticated By: Cyndie Chime, M.D.    Scheduled Meds:    . amLODipine  10 mg Oral Daily  . atorvastatin  10 mg Oral QHS  . cholecalciferol  2,000 Units Oral Daily  . cholestyramine light  4 g Oral BID  . clindamycin  600 mg Oral Q8H  . clopidogrel  75 mg Oral Daily  . donepezil  10 mg Oral q morning - 10a  . ferrous sulfate  325 mg Oral Q breakfast  . HYDROmorphone      . multivitamin with minerals  1 tablet Oral Daily  . pantoprazole  40 mg Oral Daily  . potassium chloride SA  20 mEq Oral Daily  . predniSONE  2 mg Oral Daily  . rOPINIRole  0.5 mg Oral Custom  . rOPINIRole  1 mg Oral Custom  . sodium chloride  3 mL Intravenous Q12H  . warfarin  5 mg Oral ONCE-1800  . Warfarin - Pharmacist Dosing Inpatient   Does not apply q1800  . zaleplon  10 mg Oral QHS  . DISCONTD: cefTRIAXone (ROCEPHIN)  IV  1 g Intravenous Q24H  . DISCONTD: hydrALAZINE  10 mg Intravenous Once  . DISCONTD: vancomycin  750 mg Intravenous Q24H  . DISCONTD: warfarin  2.5 mg Oral ONCE-1800   Continuous Infusions:   Active Problems:  HTN (hypertension)  Chest pain  Cellulitis  Weakness of both legs  Abscess  Bradycardia    Time spent: 25    Marlin Canary  [facility] Pager [phone] 06/13/2012, 10:23 AM  LOS: 4 days

## 2012-06-13 NOTE — Progress Notes (Signed)
Patient's HR dropping into the 30's in 40's while sleeping. Patient asymptomatic and resting. Vitals T 97.9 P 56 R19 BP 156/66 02 97 2L N/C. EKG showed Sinus bradycardia w/ sinus arrhythmia. Dr. On call notified and made aware. Will continue to monitor patient.

## 2012-06-13 NOTE — Progress Notes (Signed)
ANTICOAGULATION CONSULT NOTE - Follow Up Consult  Pharmacy Consult for coumadin Indication: DVT/PE  Allergies  Allergen Reactions  . Codeine Phosphate Nausea And Vomiting  . Darvocet (Propoxyphene-Acetaminophen) Nausea And Vomiting  . Promethazine Hcl Nausea And Vomiting  . Sulfamethoxazole Other (See Comments)    Pt doesn't remember reaction  . Amoxicillin Nausea And Vomiting and Rash  . Penicillins Nausea And Vomiting and Rash    Patient Measurements: Height: 5\' 1"  (154.9 cm) Weight: 148 lb 14.4 oz (67.541 kg) IBW/kg (Calculated) : 47.8    Vital Signs: Temp: 98.3 F (36.8 C) (08/13 0500) Temp src: Oral (08/13 0500) BP: 154/51 mmHg (08/13 0500) Pulse Rate: 57  (08/13 0500)  Labs:  [name] [name] 06/13/12 0520 06/11/12 0556 06/10/12 2021  HGB -- -- --  HCT -- -- --  PLT -- -- --  APTT -- -- --  LABPROT 15.0 16.2* 16.2*  INR 1.16 1.27 1.27  HEPARINUNFRC -- -- --  CREATININE 0.66 -- --  CKTOTAL -- -- --  CKMB -- -- --  TROPONINI -- -- --    Estimated Creatinine Clearance: 42.7 ml/min (by C-G formula based on Cr of 0.66).   Medications:  Scheduled:     . amLODipine  10 mg Oral Daily  . atorvastatin  10 mg Oral QHS  . cholecalciferol  2,000 Units Oral Daily  . cholestyramine light  4 g Oral BID  . clindamycin  600 mg Oral Q8H  . clopidogrel  75 mg Oral Daily  . donepezil  10 mg Oral q morning - 10a  . ferrous sulfate  325 mg Oral Q breakfast  . HYDROmorphone      . multivitamin with minerals  1 tablet Oral Daily  . pantoprazole  40 mg Oral Daily  . potassium chloride SA  20 mEq Oral Daily  . predniSONE  2 mg Oral Daily  . rOPINIRole  0.5 mg Oral Custom  . rOPINIRole  1 mg Oral Custom  . sodium chloride  3 mL Intravenous Q12H  . warfarin  5 mg Oral ONCE-1800  . Warfarin - Pharmacist Dosing Inpatient   Does not apply q1800  . zaleplon  10 mg Oral QHS  . DISCONTD: cefTRIAXone (ROCEPHIN)  IV  1 g Intravenous Q24H  . DISCONTD: hydrALAZINE  10 mg Intravenous  Once  . DISCONTD: vancomycin  750 mg Intravenous Q24H  . DISCONTD: warfarin  2.5 mg Oral ONCE-1800    Assessment:  78 yo female admitted for CP and found to have cellulitis/abscesses status post I&D on 8/12.  On Coumadin for history of PE/DVT in 2011 and was held 8/10-8/11 for I&D 8/12.  Of note, patient received Vit K 5 mg po and FFP for reversal.  INR sub-therapeutic at 1.16.  Coumadin resumed last PM.  Vancomycin and ceftriaxone discontinued, clindamycin started for MRSA from outpatient culture (in chart).     Goal of Therapy:  INR 2-3 Monitor platelets by anticoagulation protocol: Yes   Plan:  1.  Repeat Coumadin 5 mg po x 1 tonight (to try to overcome Vit K resistance), home dose was 2.5 mg po daily (and was therapeutic on it) 2.  Daily PT/INR   Rolland Porter, Pharm.D., BCPS Clinical Pharmacist Pager: [phone] 06/13/2012 10:28 AM

## 2012-06-13 NOTE — Progress Notes (Signed)
1 Day Post-Op  Subjective: Resting comfortably this morning, states that she feels better this morning, no c/o offered.  Objective: Vital signs in last 24 hours: Temp:  [97.5 F (36.4 C)-98.3 F (36.8 C)] 98.3 F (36.8 C) (08/13 0500) Pulse Rate:  [49-66] 57  (08/13 0500) Resp:  [11-20] 19  (08/13 0434) BP: (136-196)/(51-77) 154/51 mmHg (08/13 0500) SpO2:  [94 %-100 %] 97 % (08/13 0500) Last BM Date: 06/11/12  Intake/Output from previous day: 08/12 0701 - 08/13 0700 In: 450 [I.V.:450] Out: -  Intake/Output this shift:    General appearance: alert, cooperative, appears stated age and no distress Left axilla area: dressing C/D/I minimal serous drainage on bandage, area of erythema appears to be retreating from pre-op, area of induration is reduced compared to pre-op.  VSS, Afebrile, no recent CBC.   Lab Results:  No results found for this basename: WBC:2,HGB:2,HCT:2,PLT:2 in the last 72 hours BMET  [facility] 06/13/12 0520  NA 137  K 4.2  CL 104  CO2 25  GLUCOSE 91  BUN 11  CREATININE 0.66  CALCIUM 9.4   PT/INR  Basename 06/13/12 0520 06/11/12 0556  LABPROT 15.0 16.2*  INR 1.16 1.27   ABG No results found for this basename: PHART:2,PCO2:2,PO2:2,HCO3:2 in the last 72 hours  Studies/Results: No results found.  Anti-infectives: Anti-infectives     Start     Dose/Rate Route Frequency Ordered Stop   06/13/12 1100   clindamycin (CLEOCIN) capsule 600 mg        600 mg Oral 3 times per day 06/13/12 1005     06/10/12 2200   vancomycin (VANCOCIN) 750 mg in sodium chloride 0.9 % 150 mL IVPB  Status:  Discontinued        750 mg 150 mL/hr over 60 Minutes Intravenous Every 24 hours 06/09/12 1549 06/13/12 1007   06/09/12 1700   cefTRIAXone (ROCEPHIN) 1 g in dextrose 5 % 50 mL IVPB  Status:  Discontinued        1 g 100 mL/hr over 30 Minutes Intravenous Every 24 hours 06/09/12 1525 06/12/12 1613   06/09/12 1345   vancomycin (VANCOCIN) IVPB 1000 mg/200 mL premix        1,000 mg 200 mL/hr over 60 Minutes Intravenous  Once 06/09/12 1338 06/09/12 2303          Assessment/Plan: s/p Procedure(s) (LRB): INCISION AND DRAINAGE ABSCESS (N/A)  Continue wound care Continue ABX for now Will recheck CBC in am    LOS: 4 days    [name] [name] 06/13/2012

## 2012-06-13 NOTE — Progress Notes (Signed)
Physical Therapy Treatment Patient Details Name: [name] [name] MRN: [id number] DOB: [birth year] Today's Date: 06/13/2012 Time: 4098-1191 PT Time Calculation (min): 21 min  PT Assessment / Plan / Recommendation Comments on Treatment Session  78 y.o. female admitted to [facility] for ;eft axillary abcess now s/p draining and debridement.  She is progressing well with her ambulation, but doesn't feel that she can take care of herself at home and would like to persue rehab before discharge home.  I asked if she had ever been to a rehab center before and she said "no".      Follow Up Recommendations  Skilled nursing facility    Barriers to Discharge        Equipment Recommendations  None recommended by PT    Recommendations for Other Services    Frequency Min 3X/week   Plan Discharge plan needs to be updated;Frequency remains appropriate    Precautions / Restrictions Precautions Precautions: Fall Restrictions Weight Bearing Restrictions: No   Pertinent Vitals/Pain No reports    Mobility  Bed Mobility Rolling Left: 6: Modified independent (Device/Increase time);With rail Left Sidelying to Sit: 6: Modified independent (Device/Increase time);With rails;HOB flat Sitting - Scoot to Edge of Bed: 6: Modified independent (Device/Increase time);With rail Details for Bed Mobility Assistance: reliance on rail to get to EOB Transfers Sit to Stand: 4: Min guard Stand to Sit: 4: Min guard Details for Transfer Assistance: min guard assist for safety secondary to using momentum to get to standing and uncontrolled descent to sit.   Ambulation/Gait Ambulation/Gait Assistance: 4: Min guard Ambulation Distance (Feet): 200 Feet Assistive device: Rolling walker Ambulation/Gait Assistance Details: min guard assistance due to slow gait speed and quickly fatigued.  Gait Pattern: Step-through pattern;Shuffle;Trunk flexed Gait velocity: 1.51 ft/sec (<1.8 ft/sec puts her at risk for recurrent falls)      Exercises General Exercises - Lower Extremity Long Arc Quad: AROM;Both;10 reps;Seated Hip ABduction/ADduction: AROM;Both;10 reps;Seated (adduction against pillow for resistance) Hip Flexion/Marching: AROM;10 reps;Both;Seated Toe Raises: AROM;Both;10 reps;Seated Heel Raises: AROM;Both;10 reps;Seated    PT Goals Acute Rehab PT Goals PT Goal: Supine/Side to Sit - Progress: Progressing toward goal PT Goal: Sit to Stand - Progress: Progressing toward goal PT Goal: Ambulate - Progress: Progressing toward goal  Visit Information  Last PT Received On: 06/13/12 Assistance Needed: +1    Subjective Data  Subjective: Pt reports that she feels pretty good.     Cognition  Overall Cognitive Status: Appears within functional limits for tasks assessed/performed    Balance     End of Session PT - End of Session Activity Tolerance: Patient limited by fatigue Patient left: in chair;with call bell/phone within reach   GP     Ziah Turvey B. Jeanpierre Thebeau, PT, DPT [phone]   06/13/2012, 5:19 PM

## 2012-06-13 NOTE — Progress Notes (Signed)
Patient complaining of a burning sensation in her chest after eating around 8 pm.  Vital signs T98.1 P 58 R18 BP 136/69 02 95 2L N/C. EKG showed NSR.  Patient given GI cocktail which relieved discomfort. Dr. On call notified and made aware. Will continue to monitor patient.

## 2012-06-14 DIAGNOSIS — R0609 Other forms of dyspnea: Secondary | ICD-10-CM

## 2012-06-14 LAB — PROTIME-INR: Prothrombin Time: 22.6 seconds — ABNORMAL HIGH (ref 11.6–15.2)

## 2012-06-14 LAB — URINE CULTURE: Colony Count: >=100000

## 2012-06-14 LAB — CULTURE, ROUTINE-ABSCESS: Gram Stain: NONE SEEN

## 2012-06-14 LAB — CBC
Hemoglobin: 10.6 g/dL — ABNORMAL LOW (ref 12.0–15.0)
MCH: 27.7 pg (ref 26.0–34.0)
MCHC: 32.1 g/dL (ref 30.0–36.0)
MCV: 86.4 fL (ref 78.0–100.0)

## 2012-06-14 MED ORDER — DOXYCYCLINE HYCLATE 100 MG PO TABS
100.0000 mg | ORAL_TABLET | Freq: Two times a day (BID) | ORAL | Status: DC
  Administered 2012-06-14: 100 mg via ORAL
  Filled 2012-06-14 (×2): qty 1

## 2012-06-14 MED ORDER — CHOLESTYRAMINE LIGHT 4 G PO PACK: 4.0000 g | PACK | Freq: Every day | ORAL | Status: DC | PRN

## 2012-06-14 MED ORDER — FLEET ENEMA 7-19 GM/118ML RE ENEM
1.0000 | ENEMA | Freq: Once | RECTAL | Status: AC
  Administered 2012-06-14: 1 via RECTAL
  Filled 2012-06-14: qty 1

## 2012-06-14 MED ORDER — DOXYCYCLINE HYCLATE 100 MG PO TABS: 100.0000 mg | ORAL_TABLET | Freq: Two times a day (BID) | ORAL | Status: AC

## 2012-06-14 MED ORDER — LORAZEPAM 0.5 MG PO TABS: 0.2500 mg | ORAL_TABLET | Freq: Three times a day (TID) | ORAL | Status: DC | PRN

## 2012-06-14 MED ORDER — ROPINIROLE HCL 0.5 MG PO TABS: 0.5000 mg | ORAL_TABLET | Freq: Two times a day (BID) | ORAL | Status: DC

## 2012-06-14 MED ORDER — ZALEPLON 10 MG PO CAPS: 10.0000 mg | ORAL_CAPSULE | Freq: Every day | ORAL | Status: DC

## 2012-06-14 MED ORDER — WARFARIN SODIUM 2.5 MG PO TABS
2.5000 mg | ORAL_TABLET | Freq: Once | ORAL | Status: DC
  Filled 2012-06-14: qty 1

## 2012-06-14 MED ORDER — CHOLESTYRAMINE LIGHT 4 G PO PACK
4.0000 g | PACK | Freq: Every day | ORAL | Status: DC | PRN
  Filled 2012-06-14: qty 1

## 2012-06-14 MED ORDER — ROPINIROLE HCL 1 MG PO TABS: 1.0000 mg | ORAL_TABLET | Freq: Two times a day (BID) | ORAL | Status: DC

## 2012-06-14 NOTE — Progress Notes (Addendum)
Physical Therapy Treatment Patient Details Name: [name] [name] MRN: [id number] DOB: Mar 08, 1921 Today's Date: 06/14/2012 Time: 4098-1191 PT Time Calculation (min): 29 min  PT Assessment / Plan / Recommendation Comments on Treatment Session  78 y.o. female admitted to [facility] for ;eft axillary abcess now s/p draining and debridement. She is progressing slower today secondary to fatigue from not sleeping well last night.      Follow Up Recommendations  Skilled nursing facility    Barriers to Discharge        Equipment Recommendations  None recommended by PT    Recommendations for Other Services    Frequency Min 3X/week   Plan Frequency remains appropriate;Discharge plan remains appropriate    Precautions / Restrictions Precautions Precautions: Fall   Pertinent Vitals/Pain 2/10 left axilla pain    Mobility  Bed Mobility Left Sidelying to Sit: 6: Modified independent (Device/Increase time);HOB elevated;With rails Sitting - Scoot to Edge of Bed: 6: Modified independent (Device/Increase time);With rail Details for Bed Mobility Assistance: relied on railing again to get to sitting Transfers Transfers: Sit to Stand;Stand to Sit;Stand Pivot Transfers Sit to Stand: 4: Min guard;With armrests;From bed;From chair/3-in-1;With upper extremity assist Stand to Sit: 4: Min guard;With armrests;With upper extremity assist;To chair/3-in-1;To bed Stand Pivot Transfers: 4: Min guard;From elevated surface Details for Transfer Assistance: min guard assist for safety secondary to balance deficits Ambulation/Gait Ambulation/Gait Assistance: 5: Supervision Ambulation Distance (Feet): 200 Feet Assistive device: Rolling walker Ambulation/Gait Assistance Details: supervision for safety secondary to slow gait speed Gait Pattern: Step-through pattern;Shuffle;Trunk flexed    Exercises General Exercises - Lower Extremity Long Arc Quad: AROM;Both;10 reps;Seated Hip ABduction/ADduction: AROM;Both;10  reps;Seated Hip Flexion/Marching: AROM;10 reps;Both;Seated Toe Raises: AROM;Both;10 reps;Seated Heel Raises: AROM;Both;10 reps;Seated     PT Goals Acute Rehab PT Goals PT Goal: Supine/Side to Sit - Progress: Progressing toward goal PT Goal: Sit to Stand - Progress: Progressing toward goal PT Transfer Goal: Bed to Chair/Chair to Bed - Progress: Progressing toward goal PT Goal: Ambulate - Progress: Progressing toward goal  Visit Information  Last PT Received On: 06/14/12 Assistance Needed: +1    Subjective Data  Subjective: PT reports that she did not sleep well last night.  RN reported that she did not get her sleeping pill.     Cognition  Overall Cognitive Status: Appears within functional limits for tasks assessed/performed       End of Session PT - End of Session Activity Tolerance: Patient limited by fatigue Patient left: in bed;with call bell/phone within reach   GP     Jaquail Mclees B. Eriberto Felch, PT, DPT [phone]   06/14/2012, 11:45 AM

## 2012-06-14 NOTE — Progress Notes (Signed)
[name] [name] from [facility] lab called to verify Abscess Culture from 06/12/12 was positive for MRSA.

## 2012-06-14 NOTE — Progress Notes (Signed)
.  Clinical social worker assisted with patient discharge to skilled nursing facility, [facility]. Pt had bm and able to discharge at this time. Pt son present in pt room. .Patient transportation provided by [employer] [facility] with patient chart copy. .No further Clinical Social Work needs, signing off.   Catha Gosselin, Theresia Majors  [phone] .06/14/2012 1516pm

## 2012-06-14 NOTE — Progress Notes (Signed)
Patient ID: [name] [name], female   DOB: 15-Jul-1921, 78 y.o.   MRN: [id number] 2 Days Post-Op  Subjective: Resting comfortably this morning, states that she feels better this morning, no c/o offered.  Objective: Vital signs in last 24 hours: Temp:  [98.4 F (36.9 C)] 98.4 F (36.9 C) (08/14 0500) Pulse Rate:  [59-62] 62  (08/14 0500) Resp:  [18] 18  (08/14 0500) BP: (134-155)/(59-75) 134/75 mmHg (08/14 0500) SpO2:  [97 %-100 %] 97 % (08/14 0500) Last BM Date: 06/13/12  Intake/Output from previous day: 08/13 0701 - 08/14 0700 In: 840 [P.O.:840] Out: -  Intake/Output this shift:    General appearance: alert, cooperative, appears stated age and no distress Left axilla area: dressing C/D/I area of erythema appears to be continuing its retreat from pre-op, area of induration is also reduced compared to pre-op.  VSS, Afebrile, WBC wnl.  Lab Results:   [facility] 06/14/12 0627  WBC 7.0  HGB 10.6*  HCT 33.0*  PLT 291   BMET  Basename 06/13/12 0520  NA 137  K 4.2  CL 104  CO2 25  GLUCOSE 91  BUN 11  CREATININE 0.66  CALCIUM 9.4   PT/INR  Basename 06/14/12 0627 06/13/12 0520  LABPROT 22.6* 15.0  INR 1.95* 1.16   ABG No results found for this basename: PHART:2,PCO2:2,PO2:2,HCO3:2 in the last 72 hours  Studies/Results: No results found.  Anti-infectives: Anti-infectives     Start     Dose/Rate Route Frequency Ordered Stop   06/13/12 1100   clindamycin (CLEOCIN) capsule 600 mg        600 mg Oral 3 times per day 06/13/12 1005     06/10/12 2200   vancomycin (VANCOCIN) 750 mg in sodium chloride 0.9 % 150 mL IVPB  Status:  Discontinued        750 mg 150 mL/hr over 60 Minutes Intravenous Every 24 hours 06/09/12 1549 06/13/12 1007   06/09/12 1700   cefTRIAXone (ROCEPHIN) 1 g in dextrose 5 % 50 mL IVPB  Status:  Discontinued        1 g 100 mL/hr over 30 Minutes Intravenous Every 24 hours 06/09/12 1525 06/12/12 1613   06/09/12 1345   vancomycin (VANCOCIN) IVPB 1000  mg/200 mL premix        1,000 mg 200 mL/hr over 60 Minutes Intravenous  Once 06/09/12 1338 06/09/12 2303          Assessment/Plan: s/p Procedure(s) (LRB): INCISION AND DRAINAGE ABSCESS (N/A)  Continue wound care Continue ABX for now Clear for discharge to SNF from surgical standpoint as they can do daily dressing changes there. She will need f/u with our office in 2 weeks time once she is discharged.   LOS: 5 days    Stevee Valenta 06/14/2012

## 2012-06-14 NOTE — Progress Notes (Signed)
ANTICOAGULATION CONSULT NOTE - Follow Up Consult  Pharmacy Consult for coumadin Indication: DVT/PE  Allergies  Allergen Reactions  . Codeine Phosphate Nausea And Vomiting  . Darvocet (Propoxyphene-Acetaminophen) Nausea And Vomiting  . Promethazine Hcl Nausea And Vomiting  . Sulfamethoxazole Other (See Comments)    Pt doesn't remember reaction  . Amoxicillin Nausea And Vomiting and Rash  . Penicillins Nausea And Vomiting and Rash    Patient Measurements: Height: 5\' 1"  (154.9 cm) Weight: 148 lb 14.4 oz (67.541 kg) IBW/kg (Calculated) : 47.8    Vital Signs: Temp: 98.4 F (36.9 C) (08/14 0500) Temp src: Oral (08/14 0500) BP: 101/59 mmHg (08/14 0912) Pulse Rate: 59  (08/14 0912)  Labs:  Basename 06/14/12 0627 06/13/12 0520  HGB 10.6* --  HCT 33.0* --  PLT 291 --  APTT -- --  LABPROT 22.6* 15.0  INR 1.95* 1.16  HEPARINUNFRC -- --  CREATININE -- 0.66  CKTOTAL -- --  CKMB -- --  TROPONINI -- --    Estimated Creatinine Clearance: 42.7 ml/min (by C-G formula based on Cr of 0.66).   Medications:  Scheduled:     . amLODipine  10 mg Oral Daily  . atorvastatin  10 mg Oral QHS  . cholecalciferol  2,000 Units Oral Daily  . cholestyramine light  4 g Oral BID  . clopidogrel  75 mg Oral Daily  . donepezil  10 mg Oral q morning - 10a  . doxycycline  100 mg Oral Q12H  . ferrous sulfate  325 mg Oral Q breakfast  . multivitamin with minerals  1 tablet Oral Daily  . pantoprazole  40 mg Oral Daily  . potassium chloride SA  20 mEq Oral Daily  . predniSONE  2 mg Oral Daily  . rOPINIRole  0.5 mg Oral Custom  . rOPINIRole  1 mg Oral Custom  . sodium chloride  3 mL Intravenous Q12H  . warfarin  5 mg Oral ONCE-1800  . Warfarin - Pharmacist Dosing Inpatient   Does not apply q1800  . zaleplon  10 mg Oral QHS  . DISCONTD: clindamycin  600 mg Oral Q8H    Assessment:  78 yo female admitted for CP and found to have cellulitis/abscesses status post I&D on 8/12.  On Coumadin for  history of PE/DVT in 2011 and was held 8/10-8/11 for I&D 8/12.  Of note, patient received Vit K 5 mg po and FFP for reversal.  INR remains sub-therapeutic at 1.95 (but jump from 1.16).  Has received double her Coumadin dose x 2 days to overcome Vit K resistance.  Patient now on doxy for MRSA in wound.     Goal of Therapy:  INR 2-3 Monitor platelets by anticoagulation protocol: Yes   Plan:  1.  Decrease Coumadin to 2.5 mg po x 1 tonight (home dose was 2.5 mg po daily and was therapeutic on it) 2.  Daily PT/INR   Rolland Porter, Pharm.D., BCPS Clinical Pharmacist Pager: [phone] 06/14/2012 10:59 AM

## 2012-06-14 NOTE — Progress Notes (Signed)
Pt anticipated to discharge to [facility] today. CSW submitted d/c and avs to facility. Pt chart copy in [place]. Pt dc pending bm after enema. Pt facility and pt son aware. .Clinical social worker continuing to follow pt to assist with pt dc plans and further csw needs.   Catha Gosselin, LCSWA  [phone] 06/14/2012 1428pm

## 2012-06-14 NOTE — Discharge Summary (Signed)
Physician Discharge Summary  Patient ID: [name] [name] MRN: [id number] DOB/AGE: 1921-09-26 78 y.o.  Admit date: 06/09/2012 Discharge date: 06/14/2012  Primary Care Physician:  [name], MD  Discharge Diagnoses:     Axilla abscess/cellulitis and R labial abscess, culture positive for MRSA  .Chest pain   bradycardia  . generalized weakness  . Dementia  . HTN (hypertension)  PMR on steroids Strept  UTI  Consults:  General surgery, Dr Carolynne Edouard   Discharge Medications: Medication List  As of 06/14/2012 11:43 AM   STOP taking these medications         amLODipine 10 MG tablet         TAKE these medications         acetaminophen 500 MG tablet   Commonly known as: TYLENOL   Take 500 mg by mouth every 6 (six) hours as needed. For pain      atorvastatin 20 MG tablet   Commonly known as: LIPITOR   Take 10 mg by mouth at bedtime.      cholecalciferol 1000 UNITS tablet   Commonly known as: VITAMIN D   Take 2,000 Units by mouth daily.      cholestyramine light 4 G packet   Commonly known as: PREVALITE   Take 1 packet (4 g total) by mouth daily as needed (loose stools).      clopidogrel 75 MG tablet   Commonly known as: PLAVIX   Take 75 mg by mouth daily.      donepezil 10 MG tablet   Commonly known as: ARICEPT   Take 10 mg by mouth every morning.      doxycycline 100 MG tablet   Commonly known as: VIBRA-TABS   Take 1 tablet (100 mg total) by mouth every 12 (twelve) hours. X 2 weeks, complete on 06/29/12      ferrous sulfate 325 (65 FE) MG EC tablet   Take 325 mg by mouth daily with breakfast.      LORazepam 0.5 MG tablet   Commonly known as: ATIVAN   Take 0.5 tablets (0.25 mg total) by mouth 3 (three) times daily as needed for anxiety. for anxiety      multivitamin with minerals Tabs   Take 1 tablet by mouth daily.      NEXIUM 40 MG capsule   Generic drug: esomeprazole   Take 40 mg by mouth daily before breakfast.      nitroGLYCERIN 0.4 MG SL tablet     Commonly known as: NITROSTAT   Place 0.4 mg under the tongue every 5 (five) minutes x 3 doses as needed. For chest pain      potassium chloride SA 20 MEQ tablet   Commonly known as: K-DUR,KLOR-CON   Take 20 mEq by mouth daily.      predniSONE 1 MG tablet   Commonly known as: DELTASONE   Take 2 mg by mouth daily.      rOPINIRole 1 MG tablet   Commonly known as: REQUIP   Take 1 tablet (1 mg total) by mouth 2 (two) times daily. AM and QHS      rOPINIRole 0.5 MG tablet   Commonly known as: REQUIP   Take 1 tablet (0.5 mg total) by mouth 2 (two) times daily. Late afternoon and 3 hours before bed time      traMADol 50 MG tablet   Commonly known as: ULTRAM   Take 25 mg by mouth every 6 (six) hours as needed. For pain  warfarin 2.5 MG tablet   Commonly known as: COUMADIN   Take 2.5 mg by mouth every evening.      zaleplon 10 MG capsule   Commonly known as: SONATA   Take 10 mg by mouth at bedtime.             Brief H and P: For complete details please refer to admission H and P, but in brief patient is 78 year old female with history of coronary disease, VTE, PMR presented with recurrent complaints of chest pain versus epigastric pain. [facility] (Dr. Antoine Poche) is very familiar with the patient and has indicated (03/21/12 office visit note) that no further cardiology work up is indicated - she needs to be on both plavix and coumadin for history of many stents in her arteries and DVT/PE in 2011. On the morning of admission, patient reported that she had shortness of breath and heaviness in her left chest. When she tried to stand her legs were too weak and she had to sit back down. Chest and epigastric pain was relieved with GI cocktail. Patient was recently found to have UTI and placed on doxycycline.  She was not found to be orthostatic in the ED.  Further the EDP noted cellulitis (not hydradenitis) in her axillary region- patient states it has been worsening over the last  few weeks, +chills, no fevers   Hospital Course:   1. UTI- Streptococcus, patient received Rocephin IV for 3 days.   2. Chest pain/epigastric pain- patient was admitted to telemetry, serial cardiac enzymes were obtained which were negative. Patient's last visit to cardiologist, Dr Antoine Poche on 03/21/12, recommended no further cardiovascular testing and needs to continue coumadin and plavix. Pain was better with GI cockatil.  3. Axilla abscess/cellulitis and Rt labial abscess-general surgery was consulted and patient underwent I&D, she was placed on IV vancomycin, wound culture showed MRSA. On followup today patient is cleared from surgical standpoint to be discharged. She is to continue daily dressing changes and followup with Dr. Carolynne Edouard in 2 weeks. Per the culture and sensitivities and discussion with infectious disease, Dr. Algis Liming patient was transitioned to oral doxycycline twice a day for 2 weeks.  4. Generalized Weakness- physical therapy evaluation was done and recommended skilled nursing facility  5. Bradycardia at night- patient is not on rate lowering medications, has cardiologist (Dr Riley Kill Dr Antoine Poche) if needs evaluation outpatient  6. Dementia- at baseline 7. PMR- continue steroids   Day of Discharge BP 101/59  Pulse 59  Temp 98.4 F (36.9 C) (Oral)  Resp 18  Ht 5\' 1"  (1.549 m)  Wt 67.541 kg (148 lb 14.4 oz)  BMI 28.13 kg/m2  SpO2 97%  Physical Exam: General: Alert and awake oriented x3 not in any acute distress. HEENT: anicteric sclera, pupils reactive to light and accommodation CVS: S1-S2 clear no murmur rubs or gallops Chest: clear to auscultation bilaterally, no wheezing rales or rhonchi Abdomen: soft nontender, nondistended, normal bowel sounds, no organomegaly Extremities: no cyanosis, clubbing or edema noted bilaterally, dressing in the left axillary area, area of erythema and induration reduced compared to pre-op Neuro: Cranial nerves II-XII intact, no focal  neurological deficits   The results of significant diagnostics from this hospitalization (including imaging, microbiology, ancillary and laboratory) are listed below for reference.    LAB RESULTS: Basic Metabolic Panel:  Lab 06/13/12 1610 06/10/12 0535  NA 137 142  K 4.2 3.9  CL 104 108  CO2 25 23  GLUCOSE 91 98  BUN 11 8  CREATININE 0.66 0.67  CALCIUM 9.4 8.6  MG -- --  PHOS -- --   Liver Function Tests:  Lab 06/10/12 0535  AST 14  ALT 10  ALKPHOS 47  BILITOT 0.2*  PROT 5.9*  ALBUMIN 2.7*   CBC:  Lab 06/14/12 0627 06/09/12 1045  WBC 7.0 10.0  NEUTROABS -- --  HGB 10.6* 11.6*  HCT 33.0* 36.0  MCV 86.4 --  PLT 291 230   Cardiac Enzymes:  Lab 06/10/12 0726 06/09/12 2300  CKTOTAL 294* 298*  CKMB 1.7 2.2  CKMBINDEX -- --  TROPONINI <0.30 <0.30   BNP: No components found with this basename: POCBNP:2 CBG: No results found for this basename: GLUCAP:2 in the last 168 hours  Significant Diagnostic Studies:  Ct Chest W Contrast  06/09/2012  *RADIOLOGY REPORT*  Clinical Data: Chest pain, epigastric pain  CT CHEST WITH CONTRAST  Technique:  Multidetector CT imaging of the chest was performed following the standard protocol during bolus administration of intravenous contrast.  Contrast: OMNIPAQUE IOHEXOL 300 MG/ML  SOLN  Comparison: CT 02/02/2012  Findings: No axillary or supraclavicular lymphadenopathy.  No mediastinal or hilar lymphadenopathy.  No pericardial fluid. Coronary stents are noted.  Esophagus is normal.  Review of the lung parenchyma demonstrates linear atelectasis at the right lung base.  No pleural fluid, pneumonia, or pneumothorax. Airways are normal.  Limited view of the upper abdomen is unremarkable.  Limited view of the skeleton again demonstrates mild compression fractures of the lower thoracic spine pain, unchanged from prior.  IMPRESSION:  1.  No acute thoracic findings. 2.  Chronic compression fracture lower thoracic spine.  Original Report  Authenticated By: Genevive Bi, M.D.   Chest Portable 1 View  06/09/2012  *RADIOLOGY REPORT*  Clinical Data: Chest pain, shortness of breath.  PORTABLE CHEST - 1 VIEW  Comparison: 05/25/2012  Findings: Stable elevation of the right hemidiaphragm.  Heart is borderline in size.  Lungs are clear.  No effusions or acute bony abnormality.  IMPRESSION: No acute cardiopulmonary disease.  Original Report Authenticated By: Cyndie Chime, M.D.     Disposition and Follow-up: Discharge Orders    Future Orders Please Complete By Expires   Diet - low sodium heart healthy      Increase activity slowly      Discharge wound care:      Comments:   Dressing changes daily, Pack with NS kerlix wet to dry.       DISPOSITION: SNF DIET: Heart healthy  ACTIVITY: As tolerated   DISCHARGE FOLLOW-UP Follow-up Information    Follow up with TOTH III,PAUL S, MD in 2 weeks. ( with her axilla wound.)    Contact information:   [address] [place] [address] [phone]       Follow up with [name], MD. Schedule an appointment as soon as possible for a visit in 10 days. (for hospital follow-up)    Contact information:   [address] [place] [address] [phone]          Time spent on Discharge: 45 minutes  Signed:   RAI,RIPUDEEP M.D. [facility] 06/14/2012, 11:43 AM Pager: [phone]  If 7PM-7AM, please contact night-coverage [email or web address] Password [email or web address]

## 2012-06-15 ENCOUNTER — Telehealth (INDEPENDENT_AMBULATORY_CARE_PROVIDER_SITE_OTHER): Payer: Self-pay | Admitting: General Surgery

## 2012-06-15 NOTE — Telephone Encounter (Signed)
Spoke with Atanza and let her know that [name] [name] first PO appt with Dr. Carolynne Edouard will be on 8/27 at 10:00.

## 2012-06-15 NOTE — Telephone Encounter (Signed)
Message copied by Littie Deeds on Thu Jun 15, 2012  8:15 AM ------      Message from: Cathi Roan      Created: Wed Jun 14, 2012  4:13 PM      Regarding: Est.Patient New Problem       [phone] Atanza with [facility]. Dr. Carolynne Edouard saw in ER for Lt Axilla wound and wanted to see her in Office in 2 weeks.

## 2012-06-17 LAB — ANAEROBIC CULTURE

## 2012-06-27 ENCOUNTER — Ambulatory Visit (INDEPENDENT_AMBULATORY_CARE_PROVIDER_SITE_OTHER): Payer: Medicare Other | Admitting: General Surgery

## 2012-06-27 ENCOUNTER — Encounter (INDEPENDENT_AMBULATORY_CARE_PROVIDER_SITE_OTHER): Payer: Self-pay | Admitting: General Surgery

## 2012-06-27 VITALS — BP 158/70 | HR 62 | Temp 97.6°F | Resp 14 | Ht 62.0 in | Wt 147.4 lb

## 2012-06-27 DIAGNOSIS — IMO0002 Reserved for concepts with insufficient information to code with codable children: Secondary | ICD-10-CM

## 2012-06-27 DIAGNOSIS — L02412 Cutaneous abscess of left axilla: Secondary | ICD-10-CM

## 2012-06-27 NOTE — Patient Instructions (Signed)
Remove dressing, shower daily, and replace packing

## 2012-06-27 NOTE — Progress Notes (Signed)
Subjective:     Patient ID: [name] [name], female   DOB: [birth year], 78 y.o.   MRN: [id number]  HPI The patient is an 78 year old white female who is several weeks out from an incision and drainage of a left axillary abscess. She is now to rehabilitation facility. She is getting her dressing changed daily. She denies any pain in the left axilla.  Review of Systems     Objective:   Physical Exam On exam the left axillary wound is very clean with good granulation tissue. It is much more shallow than it was in the hospital. We repacked the wound today and she tolerated this well.    Assessment:     Status post incision and drainage of left axillary abscess    Plan:     At this point I would like him to remove the dressing and get her in the shower daily to scrub the area. I would like him to continue daily dressing changes. We will plan to see her  in one month to check her progress

## 2012-06-30 ENCOUNTER — Encounter (INDEPENDENT_AMBULATORY_CARE_PROVIDER_SITE_OTHER): Payer: Medicare Other | Admitting: General Surgery

## 2012-07-04 ENCOUNTER — Ambulatory Visit: Payer: Medicare Other | Admitting: Cardiology

## 2012-07-25 ENCOUNTER — Ambulatory Visit (INDEPENDENT_AMBULATORY_CARE_PROVIDER_SITE_OTHER): Payer: Medicare Other | Admitting: General Surgery

## 2012-07-25 ENCOUNTER — Encounter (INDEPENDENT_AMBULATORY_CARE_PROVIDER_SITE_OTHER): Payer: Self-pay | Admitting: General Surgery

## 2012-07-25 VITALS — BP 132/78 | HR 68 | Temp 97.1°F | Resp 16 | Ht 62.0 in | Wt 149.2 lb

## 2012-07-25 DIAGNOSIS — L02412 Cutaneous abscess of left axilla: Secondary | ICD-10-CM

## 2012-07-25 DIAGNOSIS — IMO0002 Reserved for concepts with insufficient information to code with codable children: Secondary | ICD-10-CM

## 2012-07-25 NOTE — Progress Notes (Signed)
Subjective:     Patient ID: [name] [name], female   DOB: 1920-12-23, 78 y.o.   MRN: [id number]  HPI The patient is an 78 year old white female who is several weeks out from an incision and drainage of a left axillary abscess. She has no complaints today.  Review of Systems     Objective:   Physical Exam On exam the abscess in the left axilla is completely healed. There is no sign of infection.    Assessment:     Status post incision and drainage of left axillary abscess    Plan:     At this point she can return to all her normal activities without any restrictions. We will plan to see her back on a when necessary basis.

## 2012-07-25 NOTE — Patient Instructions (Signed)
May return to all normal activities 

## 2012-07-31 ENCOUNTER — Encounter: Payer: Self-pay | Admitting: Cardiology

## 2012-07-31 ENCOUNTER — Ambulatory Visit (INDEPENDENT_AMBULATORY_CARE_PROVIDER_SITE_OTHER): Payer: Medicare Other | Admitting: Cardiology

## 2012-07-31 VITALS — BP 115/70 | HR 59 | Ht 62.0 in | Wt 149.0 lb

## 2012-07-31 DIAGNOSIS — I2510 Atherosclerotic heart disease of native coronary artery without angina pectoris: Secondary | ICD-10-CM

## 2012-07-31 NOTE — Progress Notes (Signed)
HPI The patient presents for followup of CAD.  She's not having a particularly good day having not slept well last night. She does report occasional chest discomfort. She has days where she has weakness. She doesn't report needing to take any nitroglycerin however. She was hospitalized with some abscesses recently. She had these treated surgically. She required antibiotics. I reviewed these hospital records and note there was some chest pain. However, this was felt to be GI and we were not called. She denies any PND or orthopnea. She has had no presyncope or syncope. She is weak and gets around with a walker. She lives with her son.  Allergies  Allergen Reactions  . Codeine Phosphate Nausea And Vomiting  . Darvocet (Propoxyphene-Acetaminophen) Nausea And Vomiting  . Promethazine Hcl Nausea And Vomiting  . Sulfamethoxazole Other (See Comments)    Pt doesn't remember reaction  . Amoxicillin Nausea And Vomiting and Rash  . Penicillins Nausea And Vomiting and Rash    Current Outpatient Prescriptions  Medication Sig Dispense Refill  . acetaminophen (TYLENOL) 500 MG tablet Take 500 mg by mouth every 6 (six) hours as needed. For pain      . AMLODIPINE BESYLATE PO Take 10 mg by mouth daily.      Marland Kitchen atorvastatin (LIPITOR) 20 MG tablet Take 10 mg by mouth at bedtime. 1/2 tab daily      . benzonatate (TESSALON) 100 MG capsule Take 100 mg by mouth 3 (three) times daily as needed.      . cholecalciferol (VITAMIN D) 1000 UNITS tablet Take 2,000 Units by mouth daily.      . clopidogrel (PLAVIX) 75 MG tablet Take 75 mg by mouth daily.      Marland Kitchen donepezil (ARICEPT) 10 MG tablet Take 10 mg by mouth every morning.      . ferrous sulfate 325 (65 FE) MG EC tablet Take 325 mg by mouth daily with breakfast.      . LORazepam (ATIVAN) 0.5 MG tablet Take 0.5 tablets (0.25 mg total) by mouth 3 (three) times daily as needed for anxiety. for anxiety  30 tablet  0  . Multiple Vitamin (MULITIVITAMIN WITH MINERALS) TABS  Take 1 tablet by mouth daily.      Marland Kitchen NEXIUM 40 MG capsule Take 40 mg by mouth daily before breakfast.       . nitroGLYCERIN (NITROSTAT) 0.4 MG SL tablet Place 0.4 mg under the tongue every 5 (five) minutes x 3 doses as needed. For chest pain      . potassium chloride SA (K-DUR,KLOR-CON) 20 MEQ tablet Take 20 mEq by mouth daily.      . predniSONE (DELTASONE) 1 MG tablet Take 2 mg by mouth daily.      Marland Kitchen rOPINIRole (REQUIP) 0.5 MG tablet Take 1 tablet (0.5 mg total) by mouth 2 (two) times daily. Late afternoon and 3 hours before bed time      . traMADol (ULTRAM) 50 MG tablet Take 25 mg by mouth every 6 (six) hours as needed. For pain      . warfarin (COUMADIN) 2.5 MG tablet Take 2.5 mg by mouth every evening.       . zaleplon (SONATA) 10 MG capsule Take 1 capsule (10 mg total) by mouth at bedtime. Facility MD to refill  10 capsule  0  . DISCONTD: rOPINIRole (REQUIP) 1 MG tablet Take 1 tablet (1 mg total) by mouth 2 (two) times daily. AM and QHS        Past  Medical History  Diagnosis Date  . CAD (coronary artery disease)     S/p PTCA / stenting (last cath 2004, multiple LAD stents, 2 stents in the right coronary artery all patent)  . 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 Started coumadin  . Breast CA 2005-2006    S/p lumpectomy and radiation  . HTN (hypertension)   . Restless leg syndrome   . PMR (polymyalgia rheumatica)   . Axillary abscess     Past Surgical History  Procedure Date  . Colectomy   . Breast lumpectomy   . Stent     cardiac x 8 stents.    ROS:  As stated in the HPI and negative for all other systems.  PHYSICAL EXAM BP 115/70  Pulse 59  Ht 5\' 2"  (1.575 m)  Wt 67.586 kg (149 lb)  BMI 27.25 kg/m2 PHYSICAL EXAM GEN:  No distress, but frail appearing NECK:  No jugular venous distention at 90 degrees, waveform within normal limits, carotid upstroke brisk and symmetric, no bruits, no thyromegaly LYMPHATICS:  No cervical adenopathy LUNGS:  Clear to  auscultation bilaterally BACK:  No CVA tenderness CHEST:  Unremarkable HEART:  S1 and S2 within normal limits, no S3, no S4, no clicks, no rubs, no murmurs ABD:  Positive bowel sounds normal in frequency in pitch, no bruits, no rebound, no guarding, unable to assess midline mass or bruit with the patient seated. EXT:  2 plus pulses throughout, moderate edema, no cyanosis no clubbing SKIN:  No rashes no nodules NEURO:  Cranial nerves II through XII grossly intact, motor grossly intact throughout PSYCH:  Cognitively intact, oriented to person place and time    ASSESSMENT AND PLAN   CAD (coronary artery disease) -  The patient has no new sypmtoms. She can stop her Plavix. However, no further cardiovascular testing is suggested. Given her age and comorbidities conservative management is su I ggested.   WARFARIN - Had a long discussion with her son about this. I reviewed her hospital notes from 2011 when she had her pulmonary emboli. This was a massive pulmonary emboli by report and so I think I would continue the warfarin indefinitely understanding the risks benefits. However, I will stop the Plavix as above. Her son will let you know if she ever has any bleeding issues.   HYPERTENSION, BENIGN -  The blood pressure is at target. No change in medications is indicated. We will continue with therapeutic lifestyle changes (TLC).

## 2012-07-31 NOTE — Patient Instructions (Addendum)
Please stop your Plavix. Continue all other medications as listed.  Follow up in 1 year with Dr Hochrein.  You will receive a letter in the mail 2 months before you are due.  Please call us when you receive this letter to schedule your follow up appointment.  

## 2012-08-24 ENCOUNTER — Encounter (HOSPITAL_COMMUNITY): Payer: Self-pay | Admitting: *Deleted

## 2012-08-24 ENCOUNTER — Emergency Department (HOSPITAL_COMMUNITY): Payer: Medicare Other

## 2012-08-24 ENCOUNTER — Observation Stay (HOSPITAL_COMMUNITY)
Admission: EM | Admit: 2012-08-24 | Discharge: 2012-08-25 | Disposition: A | Discharge status: Home / Self Care | Payer: Medicare Other | Attending: Internal Medicine | Admitting: Internal Medicine

## 2012-08-24 DIAGNOSIS — N390 Urinary tract infection, site not specified: Secondary | ICD-10-CM

## 2012-08-24 DIAGNOSIS — Z79899 Other long term (current) drug therapy: Secondary | ICD-10-CM | POA: Insufficient documentation

## 2012-08-24 DIAGNOSIS — Z7902 Long term (current) use of antithrombotics/antiplatelets: Secondary | ICD-10-CM | POA: Insufficient documentation

## 2012-08-24 DIAGNOSIS — D649 Anemia, unspecified: Secondary | ICD-10-CM

## 2012-08-24 DIAGNOSIS — I2510 Atherosclerotic heart disease of native coronary artery without angina pectoris: Secondary | ICD-10-CM | POA: Insufficient documentation

## 2012-08-24 DIAGNOSIS — R0600 Dyspnea, unspecified: Secondary | ICD-10-CM

## 2012-08-24 DIAGNOSIS — L0390 Cellulitis, unspecified: Secondary | ICD-10-CM

## 2012-08-24 DIAGNOSIS — R55 Syncope and collapse: Principal | ICD-10-CM | POA: Insufficient documentation

## 2012-08-24 DIAGNOSIS — L0291 Cutaneous abscess, unspecified: Secondary | ICD-10-CM

## 2012-08-24 DIAGNOSIS — Z853 Personal history of malignant neoplasm of breast: Secondary | ICD-10-CM | POA: Insufficient documentation

## 2012-08-24 DIAGNOSIS — R0602 Shortness of breath: Secondary | ICD-10-CM | POA: Insufficient documentation

## 2012-08-24 DIAGNOSIS — R079 Chest pain, unspecified: Secondary | ICD-10-CM

## 2012-08-24 DIAGNOSIS — R29898 Other symptoms and signs involving the musculoskeletal system: Secondary | ICD-10-CM

## 2012-08-24 DIAGNOSIS — I200 Unstable angina: Secondary | ICD-10-CM

## 2012-08-24 DIAGNOSIS — IMO0002 Reserved for concepts with insufficient information to code with codable children: Secondary | ICD-10-CM | POA: Insufficient documentation

## 2012-08-24 DIAGNOSIS — Z86718 Personal history of other venous thrombosis and embolism: Secondary | ICD-10-CM | POA: Insufficient documentation

## 2012-08-24 DIAGNOSIS — M353 Polymyalgia rheumatica: Secondary | ICD-10-CM

## 2012-08-24 DIAGNOSIS — I10 Essential (primary) hypertension: Secondary | ICD-10-CM | POA: Insufficient documentation

## 2012-08-24 DIAGNOSIS — R001 Bradycardia, unspecified: Secondary | ICD-10-CM

## 2012-08-24 DIAGNOSIS — R531 Weakness: Secondary | ICD-10-CM

## 2012-08-24 DIAGNOSIS — L02412 Cutaneous abscess of left axilla: Secondary | ICD-10-CM

## 2012-08-24 DIAGNOSIS — R110 Nausea: Secondary | ICD-10-CM | POA: Insufficient documentation

## 2012-08-24 DIAGNOSIS — R262 Difficulty in walking, not elsewhere classified: Secondary | ICD-10-CM | POA: Insufficient documentation

## 2012-08-24 DIAGNOSIS — E7800 Pure hypercholesterolemia, unspecified: Secondary | ICD-10-CM

## 2012-08-24 DIAGNOSIS — I8290 Acute embolism and thrombosis of unspecified vein: Secondary | ICD-10-CM

## 2012-08-24 DIAGNOSIS — Z9861 Coronary angioplasty status: Secondary | ICD-10-CM | POA: Insufficient documentation

## 2012-08-24 DIAGNOSIS — G2581 Restless legs syndrome: Secondary | ICD-10-CM

## 2012-08-24 DIAGNOSIS — E785 Hyperlipidemia, unspecified: Secondary | ICD-10-CM

## 2012-08-24 DIAGNOSIS — Z923 Personal history of irradiation: Secondary | ICD-10-CM | POA: Insufficient documentation

## 2012-08-24 DIAGNOSIS — Z7901 Long term (current) use of anticoagulants: Secondary | ICD-10-CM | POA: Insufficient documentation

## 2012-08-24 DIAGNOSIS — I959 Hypotension, unspecified: Secondary | ICD-10-CM

## 2012-08-24 LAB — COMPREHENSIVE METABOLIC PANEL
BUN: 16 mg/dL (ref 6–23)
CO2: 22 mEq/L (ref 19–32)
Chloride: 108 mEq/L (ref 96–112)
Creatinine, Ser: 0.86 mg/dL (ref 0.50–1.10)
GFR calc non Af Amer: 58 mL/min — ABNORMAL LOW (ref >90)
Total Bilirubin: 0.2 mg/dL — ABNORMAL LOW (ref 0.3–1.2)

## 2012-08-24 LAB — URINALYSIS, ROUTINE W REFLEX MICROSCOPIC
Bilirubin Urine: NEGATIVE
Specific Gravity, Urine: 1.019 (ref 1.005–1.030)
Urobilinogen, UA: 0.2 mg/dL (ref 0.0–1.0)

## 2012-08-24 LAB — GRAM STAIN

## 2012-08-24 LAB — CBC WITH DIFFERENTIAL/PLATELET
HCT: 38.7 % (ref 36.0–46.0)
Hemoglobin: 12.3 g/dL (ref 12.0–15.0)
Lymphocytes Relative: 13 % (ref 12–46)
Monocytes Absolute: 0.4 10*3/uL (ref 0.1–1.0)
Monocytes Relative: 6 % (ref 3–12)
Neutro Abs: 6 10*3/uL (ref 1.7–7.7)
WBC: 7.4 10*3/uL (ref 4.0–10.5)

## 2012-08-24 LAB — URINE MICROSCOPIC-ADD ON

## 2012-08-24 LAB — PROTIME-INR
INR: 2.51 — ABNORMAL HIGH (ref 0.00–1.49)
Prothrombin Time: 25.9 seconds — ABNORMAL HIGH (ref 11.6–15.2)

## 2012-08-24 MED ORDER — NON FORMULARY: 10.0000 mg | Freq: Every evening | Status: DC | PRN

## 2012-08-24 MED ORDER — ZOLPIDEM TARTRATE 5 MG PO TABS
5.0000 mg | ORAL_TABLET | Freq: Every evening | ORAL | Status: DC | PRN
  Administered 2012-08-24: 5 mg via ORAL
  Filled 2012-08-24: qty 1

## 2012-08-24 MED ORDER — DONEPEZIL HCL 10 MG PO TABS
10.0000 mg | ORAL_TABLET | Freq: Every morning | ORAL | Status: DC
  Administered 2012-08-25: 10 mg via ORAL
  Filled 2012-08-24: qty 1

## 2012-08-24 MED ORDER — DEXTROSE 5 % IV SOLN
1.0000 g | Freq: Once | INTRAVENOUS | Status: AC
  Administered 2012-08-24: 1 g via INTRAVENOUS
  Filled 2012-08-24: qty 10

## 2012-08-24 MED ORDER — ONDANSETRON HCL 4 MG/2ML IJ SOLN: 4.0000 mg | Freq: Four times a day (QID) | INTRAMUSCULAR | Status: DC | PRN

## 2012-08-24 MED ORDER — ONDANSETRON HCL 4 MG PO TABS
4.0000 mg | ORAL_TABLET | Freq: Four times a day (QID) | ORAL | Status: DC | PRN
  Filled 2012-08-24: qty 0.5

## 2012-08-24 MED ORDER — ACETAMINOPHEN 325 MG PO TABS
650.0000 mg | ORAL_TABLET | Freq: Four times a day (QID) | ORAL | Status: DC | PRN
  Administered 2012-08-25: 650 mg via ORAL
  Filled 2012-08-24: qty 2

## 2012-08-24 MED ORDER — SODIUM CHLORIDE 0.9 % IJ SOLN
3.0000 mL | Freq: Two times a day (BID) | INTRAMUSCULAR | Status: DC
  Administered 2012-08-24: 3 mL via INTRAVENOUS

## 2012-08-24 MED ORDER — DEXTROSE 5 % IV SOLN
1.0000 g | INTRAVENOUS | Status: DC
  Filled 2012-08-24: qty 10

## 2012-08-24 MED ORDER — WARFARIN SODIUM 2.5 MG PO TABS
2.5000 mg | ORAL_TABLET | Freq: Every evening | ORAL | Status: DC
  Administered 2012-08-24: 2.5 mg via ORAL
  Filled 2012-08-24 (×2): qty 1

## 2012-08-24 MED ORDER — PANTOPRAZOLE SODIUM 40 MG PO TBEC
40.0000 mg | DELAYED_RELEASE_TABLET | Freq: Every day | ORAL | Status: DC
  Administered 2012-08-24 – 2012-08-25 (×2): 40 mg via ORAL
  Filled 2012-08-24 (×2): qty 1

## 2012-08-24 MED ORDER — WARFARIN - PHYSICIAN DOSING INPATIENT: Freq: Every day | Status: DC

## 2012-08-24 MED ORDER — SODIUM CHLORIDE 0.9 % IV SOLN
INTRAVENOUS | Status: AC
  Administered 2012-08-24: 21:00:00 via INTRAVENOUS

## 2012-08-24 MED ORDER — SODIUM CHLORIDE 0.9 % IV BOLUS (SEPSIS)
500.0000 mL | Freq: Once | INTRAVENOUS | Status: AC
  Administered 2012-08-24: 500 mL via INTRAVENOUS

## 2012-08-24 MED ORDER — SODIUM CHLORIDE 0.9 % IV SOLN
INTRAVENOUS | Status: DC
  Administered 2012-08-24: 21:00:00 via INTRAVENOUS

## 2012-08-24 MED ORDER — ACETAMINOPHEN 650 MG RE SUPP: 650.0000 mg | Freq: Four times a day (QID) | RECTAL | Status: DC | PRN

## 2012-08-24 NOTE — ED Provider Notes (Signed)
History     CSN: [id number]  Arrival date & time 08/24/12  1346   First MD Initiated Contact with Patient 08/24/12 1506      Chief Complaint  Patient presents with  . Weakness  . Nausea    (Consider location/radiation/quality/duration/timing/severity/associated sxs/prior treatment) Patient is a 78 y.o. female presenting with syncope. The history is provided by the patient and a relative. No language interpreter was used.  Loss of Consciousness This is a recurrent problem. The current episode started today. The problem occurs rarely. The problem has been resolved. Associated symptoms include diaphoresis and fatigue. Pertinent negatives include no abdominal pain, arthralgias, chest pain, chills, congestion, coughing, fever, headaches, nausea, neck pain, vertigo, vomiting or weakness. Nothing aggravates the symptoms. She has tried nothing for the symptoms. The treatment provided no relief.    Past Medical History  Diagnosis Date  . CAD (coronary artery disease)     S/p PTCA / stenting (last cath 2004, multiple LAD stents, 2 stents in the right coronary artery all patent)  . 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 Started coumadin  . Breast CA 2005-2006    S/p lumpectomy and radiation  . HTN (hypertension)   . Restless leg syndrome   . PMR (polymyalgia rheumatica)   . Axillary abscess     Past Surgical History  Procedure Date  . Colectomy   . Breast lumpectomy   . Stent     cardiac x 8 stents.    No family history on file.  History  Substance Use Topics  . Smoking status: Never Smoker   . Smokeless tobacco: Never Used  . Alcohol Use: No    OB History    Grav Para Term Preterm Abortions TAB SAB Ect Mult Living                  Review of Systems  Constitutional: Positive for diaphoresis and fatigue. Negative for fever, chills, activity change and appetite change.  HENT: Negative for congestion, rhinorrhea, neck pain, neck stiffness and sinus  pressure.   Eyes: Negative for discharge and visual disturbance.  Respiratory: Positive for shortness of breath. Negative for cough, chest tightness, wheezing and stridor.   Cardiovascular: Positive for syncope. Negative for chest pain and leg swelling.  Gastrointestinal: Negative for nausea, vomiting, abdominal pain, diarrhea and abdominal distention.  Genitourinary: Negative for decreased urine volume and difficulty urinating.  Musculoskeletal: Negative for back pain and arthralgias.  Skin: Positive for pallor. Negative for color change.  Neurological: Positive for syncope and light-headedness. Negative for vertigo, weakness and headaches.  Psychiatric/Behavioral: Negative for behavioral problems and agitation.  All other systems reviewed and are negative.    Allergies  Codeine phosphate; Darvocet; Promethazine hcl; Sulfamethoxazole; Amoxicillin; and Penicillins  Home Medications   Current Outpatient Rx  Name Route Sig Dispense Refill  . ACETAMINOPHEN 500 MG PO TABS Oral Take 500 mg by mouth every 6 (six) hours as needed. For pain    . AMLODIPINE BESYLATE 10 MG PO TABS Oral Take 10 mg by mouth daily.    . ATORVASTATIN CALCIUM 20 MG PO TABS Oral Take 10 mg by mouth at bedtime. 1/2 tab daily    . BENZONATATE 100 MG PO CAPS Oral Take 100 mg by mouth 3 (three) times daily as needed.    Marland Kitchen VITAMIN D 1000 UNITS PO TABS Oral Take 2,000 Units by mouth daily.    . DONEPEZIL HCL 10 MG PO TABS Oral Take 10 mg by mouth  every morning.    Marland Kitchen FERROUS SULFATE 325 (65 FE) MG PO TBEC Oral Take 325 mg by mouth daily with breakfast.    . LORAZEPAM 0.5 MG PO TABS Oral Take 0.5 tablets (0.25 mg total) by mouth 3 (three) times daily as needed for anxiety. for anxiety 30 tablet 0  . LOSARTAN POTASSIUM 50 MG PO TABS Oral Take 50 mg by mouth daily.    Marland Kitchen MELATONIN 3 MG PO TABS Oral Take 3 mg by mouth daily.    . ADULT MULTIVITAMIN W/MINERALS CH Oral Take 1 tablet by mouth daily.    Marland Kitchen NEXIUM 40 MG PO CPDR Oral  Take 40 mg by mouth daily before breakfast.     . NITROFURANTOIN MACROCRYSTAL 100 MG PO CAPS Oral Take 100 mg by mouth 2 (two) times daily.    Marland Kitchen NITROGLYCERIN 0.4 MG SL SUBL Sublingual Place 0.4 mg under the tongue every 5 (five) minutes x 3 doses as needed. For chest pain    . POTASSIUM CHLORIDE CRYS ER 20 MEQ PO TBCR Oral Take 20 mEq by mouth daily.    Marland Kitchen PREDNISONE 1 MG PO TABS Oral Take 2 mg by mouth daily.    Marland Kitchen ROPINIROLE HCL 0.5 MG PO TABS Oral Take 1 tablet (0.5 mg total) by mouth 2 (two) times daily. Late afternoon and 3 hours before bed time    . TRAMADOL HCL 50 MG PO TABS Oral Take 25 mg by mouth every 6 (six) hours as needed. For pain    . WARFARIN SODIUM 2.5 MG PO TABS Oral Take 2.5 mg by mouth every evening.     Marland Kitchen ZALEPLON 10 MG PO CAPS Oral Take 1 capsule (10 mg total) by mouth at bedtime. Facility MD to refill 10 capsule 0    BP 149/54  Temp 98.2 F (36.8 C) (Oral)  Resp 18  SpO2 98%  Physical Exam  Nursing note and vitals reviewed. Constitutional: She is oriented to person, place, and time. She appears well-developed and well-nourished. No distress.  HENT:  Head: Normocephalic and atraumatic.  Mouth/Throat: No oropharyngeal exudate.  Eyes: EOM are normal. Pupils are equal, round, and reactive to light. Right eye exhibits no discharge. Left eye exhibits no discharge.  Neck: Normal range of motion. Neck supple. No JVD present.  Cardiovascular: Normal rate, regular rhythm and normal heart sounds.   Pulmonary/Chest: Effort normal and breath sounds normal. No stridor. No respiratory distress. She exhibits no tenderness.  Abdominal: Soft. Bowel sounds are normal. She exhibits no distension. There is no tenderness. There is no guarding.  Musculoskeletal: Normal range of motion. She exhibits no edema and no tenderness.  Neurological: She is alert and oriented to person, place, and time. No cranial nerve deficit. She exhibits normal muscle tone.  Skin: Skin is warm and dry. No  rash noted. She is not diaphoretic.  Psychiatric: She has a normal mood and affect. Her behavior is normal. Judgment and thought content normal.    ED Course  Procedures (including critical care time)   Labs Reviewed  CBC WITH DIFFERENTIAL  COMPREHENSIVE METABOLIC PANEL  PROTIME-INR  TROPONIN I  URINALYSIS, ROUTINE W REFLEX MICROSCOPIC  GRAM STAIN  URINE CULTURE   No results found.   No diagnosis found.   Stat Gram stain (Final result)   Component (Lab Inquiry)      Result Time  Specimen Description  Special Requests  Gram Stain  Report Status    08/24/12 1747  URINE, CLEAN CATCH  NONE  CYTOSPIN PREP WBC PRESENT,BOTH PMN AND MONONUCLEAR POSITIVE FOR GRAM NEGATIVE RODS NO YEAST OR FUNGAL ELEMENTS SEEN  08/24/2012 FINAL         Urine microscopic-add on (Final result)  Abnormal  Component (Lab Inquiry)      Result Time  Squamous Epithelial / LPF  WBC U  RBC / HPF  BACTERIA    08/24/12 1715  MANY (A)  21-50  0-2  MANY (A)         Urinalysis, Routine w reflex microscopic (Final result)  Abnormal  Component (Lab Inquiry)      Result Time  Color, Urine  APPearance  Specific Gravity, Urine  pH  GLUCOSE U    08/24/12 1714  YELLOW  HAZY (A)  1.019  5.0  NEGATIVE           Result Time  Hgb urine dipstick  BILI UR  Ketones, ur  Protein, ur  Urobilinogen, UA    08/24/12 1714  TRACE (A)  NEGATIVE  NEGATIVE  NEGATIVE  0.2           Result Time  Nitrite  LEUKOCYTES    08/24/12 1714  NEGATIVE  MODERATE (A)         Comprehensive metabolic panel (Final result)  Abnormal  Component (Lab Inquiry)      Result Time  NA  K  CL  CO2  GLUCOSE    08/24/12 1619  140  4.3  108  22  156 (H)           Result Time  BUN  Creatinine, Ser  CALCIUM  PROTEIN  Albumin    08/24/12 1619  16  0.86  9.3  6.6  3.3 (L)           Result Time  AST  ALT  ALK PHOS  BILI TOTL  GFR calc non Af Amer    08/24/12 1619  14  12  54  0.2 (L)  58 (L)           Result Time  GFR calc Af Amer     08/24/12 1619  67 (L) The eGFR has been calculated using the CKD EPI equation. This calculation has not been validated in all clinical situations. eGFR's persistently <90 mL/min signify possible Chronic Kidney Disease.         Troponin I (Final result)   Component (Lab Inquiry)      Result Time  TROPONIN I    08/24/12 1618  <0.30 Due to the release kinetics of cTnI, a negative result within the first hours of the onset of symptoms does not rule out myocardial infarction with certainty. If myocardial infarction is still suspected, repeat the test at appropriate intervals.         Protime-INR (Final result)  Abnormal  Component (Lab Inquiry)      Result Time  Prothrombin Time  INR    08/24/12 1604  25.9 (H)  2.51 (H)         CBC with Differential (Final result)  Abnormal  Component (Lab Inquiry)      Result Time  WBC  RBC  HGB  HCT  MCV    08/24/12 1554  7.4  4.32  12.3  38.7  89.6           Result Time  MCH  MCHC  RDW  PLT  NEUTRO PCT    08/24/12 1554  28.5  31.8  14.5  272  80 (H)  Result Time  AB NEUTRO  LYMPHO PCT  AB LYM  MONO PCT  MONO ABS    08/24/12 1554  6.0  13  1.0  6  0.4           Result Time  EOS PCT  EOSINO ABS  BASOS PCT  BASOS ABS    08/24/12 1554  1  0.1  0  0.0          Imaging Results         DG Chest 2 View (Final result)   Result time:08/24/12 1606    Final result by Rad Results In Interface (08/24/12 16:06:58)    Narrative:   *RADIOLOGY REPORT*  Clinical Data: Shortness of breath, hypotension, weakness, UTI  CHEST - 2 VIEW  Comparison: 06/09/2012; 05/25/2012; chest CT - 06/09/2012  Findings:  Grossly unchanged cardiac silhouette and mediastinal contours. There is persistent mild elevation of the right hemidiaphragm. Linear heterogeneous opacities within the right middle lobe are favored to represent atelectasis. No focal parenchymal opacities. No pleural effusion or pneumothorax. Grossly unchanged bones including mid  and lower thoracic spine compression deformities.  IMPRESSION: No acute cardiopulmonary disease.   Original Report Authenticated By: Waynard Reeds, M.D.      MDM  3:50 PM pt p/w episode of light headedness, 20 min of confusion and decreased responsiveness at home. Son checked BP which was noted to be 59/49 on auto cuff at home. Pt then retunred back to baseline, now has no complaints. Episode of hypotension is concerning in the pt w/ hx CAD w/ stents x8, PE, also on macrobid x2 days for UTI. ddx incl ACS, PE, infectious process such as UTI or PNA, orthostatic hypotension, or vasovagal response. Will cehck labs, EKG, CXR, UA. Await INR to see if she needs PE study. nml VS now.  W/u c/w uti. Admitted to medicine in stable condition        Warrick Parisian, MD 08/24/12 [phone]

## 2012-08-24 NOTE — ED Notes (Signed)
Paged IV Team.  

## 2012-08-24 NOTE — ED Notes (Signed)
PA at bedside.

## 2012-08-24 NOTE — ED Notes (Signed)
Per ems patient with syncopal episode today, patient states weakness at home with family taking bp with resulting 60's systolic reading, reported paleness and weakness, patient now answering questions appropriately, with skin color wnl, patient with bp upon arrival of 149/54

## 2012-08-24 NOTE — ED Notes (Addendum)
IV team at bedside 

## 2012-08-24 NOTE — ED Notes (Addendum)
IV team unable to start line. Md Ambrose Mantle made aware.

## 2012-08-24 NOTE — H&P (Signed)
[facility] History and Physical  [name] [name] ZOX:[id number] DOB: [birth year] DOA: 08/24/2012  Referring physician: Dr. Ambrose Mantle, ER physician PCP: [name], MD  Specialists: None  Chief Complaint: Feels like she was going to pass out  HPI: [name] [name] is a 78 y.o. female  With past medical history of recurrent UTIs and hypertension who for the last day has been feeling a little bit rough. Today she felt very lightheaded and fell she was almost going to pass out. Patient's son who lives with her, had her blood pressure checked and found to be low with a systolic reportedly of 59. EMS was called and patient was transported to the emergency room. Emergency room she was noted to have lower blood pressures in the 80s. Labs were checked and she was found have a normal white blood cell count with shift. She was found to have a large urinary tract infection. Patient was given IV fluids and IV antibiotics and hospitals were called for further evaluation and admission  Review of Systems: Patient was doing okay when I saw her. She complained of fatigue. She denies any headaches, vision changes, dysphasia, chest pain, palpitations, shortness of breath, wheeze, cough, abdominal pain, hematuria, dysuria, constipation, diarrhea, focal extremity numbness or weakness or pain. Patient states she is hungry. She feels generally weak. Otherwise has no complaints.  Past Medical History  Diagnosis Date  . CAD (coronary artery disease)     S/p PTCA / stenting (last cath 2004, multiple LAD stents, 2 stents in the right coronary artery all patent)  . 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 Started coumadin  . Breast CA 2005-2006    S/p lumpectomy and radiation  . HTN (hypertension)   . Restless leg syndrome   . PMR (polymyalgia rheumatica)   . Axillary abscess    Past Surgical History  Procedure Date  . Colectomy   . Breast lumpectomy   . Stent     cardiac x 8 stents.    Social History:  reports that she has never smoked. She has never used smokeless tobacco. She reports that she does not drink alcohol or use illicit drugs. Patient normally lives at home with her son. She is able to Cardizem most activities of daily living  Allergies  Allergen Reactions  . Codeine Phosphate Nausea And Vomiting  . Darvocet (Propoxyphene-Acetaminophen) Nausea And Vomiting  . Promethazine Hcl Nausea And Vomiting  . Sulfamethoxazole Other (See Comments)    Pt doesn't remember reaction  . Amoxicillin Nausea And Vomiting and Rash  . Penicillins Nausea And Vomiting and Rash    Family history: Hypertension  Prior to Admission medications   Medication Sig Start Date End Date Taking? Authorizing Provider  acetaminophen (TYLENOL) 500 MG tablet Take 500 mg by mouth every 6 (six) hours as needed. For pain   Yes Historical Provider, MD  amLODipine (NORVASC) 10 MG tablet Take 10 mg by mouth daily.   Yes Historical Provider, MD  atorvastatin (LIPITOR) 20 MG tablet Take 10 mg by mouth at bedtime. 1/2 tab daily   Yes Historical Provider, MD  benzonatate (TESSALON) 100 MG capsule Take 100 mg by mouth 3 (three) times daily as needed.   Yes Historical Provider, MD  cholecalciferol (VITAMIN D) 1000 UNITS tablet Take 2,000 Units by mouth daily.   Yes Historical Provider, MD  donepezil (ARICEPT) 10 MG tablet Take 10 mg by mouth every morning.   Yes Historical Provider, MD  ferrous sulfate 325 (65 FE)  MG EC tablet Take 325 mg by mouth daily with breakfast. 03/05/12 03/05/13 Yes Ok Anis, NP  LORazepam (ATIVAN) 0.5 MG tablet Take 0.5 tablets (0.25 mg total) by mouth 3 (three) times daily as needed for anxiety. for anxiety 06/14/12  Yes Ripudeep Jenna Luo, MD  losartan (COZAAR) 50 MG tablet Take 50 mg by mouth daily.   Yes Historical Provider, MD  Melatonin 3 MG TABS Take 3 mg by mouth daily.   Yes Historical Provider, MD  Multiple Vitamin (MULITIVITAMIN WITH MINERALS) TABS Take 1 tablet by  mouth daily.   Yes Historical Provider, MD  NEXIUM 40 MG capsule Take 40 mg by mouth daily before breakfast.  03/05/12  Yes Historical Provider, MD  nitrofurantoin (MACRODANTIN) 100 MG capsule Take 100 mg by mouth 2 (two) times daily.   Yes Historical Provider, MD  nitroGLYCERIN (NITROSTAT) 0.4 MG SL tablet Place 0.4 mg under the tongue every 5 (five) minutes x 3 doses as needed. For chest pain 03/05/12 03/05/13 Yes Ok Anis, NP  potassium chloride SA (K-DUR,KLOR-CON) 20 MEQ tablet Take 20 mEq by mouth daily.   Yes Historical Provider, MD  predniSONE (DELTASONE) 1 MG tablet Take 2 mg by mouth daily.   Yes Historical Provider, MD  rOPINIRole (REQUIP) 0.5 MG tablet Take 1 tablet (0.5 mg total) by mouth 2 (two) times daily. Late afternoon and 3 hours before bed time 06/14/12 06/14/13 Yes Ripudeep Jenna Luo, MD  traMADol (ULTRAM) 50 MG tablet Take 25 mg by mouth every 6 (six) hours as needed. For pain   Yes Historical Provider, MD  warfarin (COUMADIN) 2.5 MG tablet Take 2.5 mg by mouth every evening.    Yes Historical Provider, MD  zaleplon (SONATA) 10 MG capsule Take 1 capsule (10 mg total) by mouth at bedtime. Facility MD to refill 06/14/12  Yes Ripudeep Jenna Luo, MD   Physical Exam: Filed Vitals:   08/24/12 1645 08/24/12 1647 08/24/12 1648 08/24/12 1650  BP: 142/62 142/62 118/89 121/62  Pulse: 55 58 66 71  Temp:      TempSrc:      Resp: 17     SpO2: 100% 100%       General:  Alert and oriented x3, no acute distress, fatigued, looks about stated age  Eyes: Sclera nonicteric, extraocular movements are intact  ENT: Normocephalic and atraumatic, mucous members are slightly dry  Neck: Supple, no appreciable thyromegaly  Cardiovascular: Regular rate and rhythm, S1-S2  Respiratory: Clear to auscultation bilaterally  Abdomen: Soft, nontender, nondistended, positive bowel sounds  Skin: No obvious skin breaks, tears or lesions  Musculoskeletal: No clubbing or cyanosis or edema  Psychiatric:  Patient is appropriate, no evidence of psychoses  Neurologic: No obvious focal deficits  Labs on Admission:  Basic Metabolic Panel:  Lab 08/24/12 7654  NA 140  K 4.3  CL 108  CO2 22  GLUCOSE 156*  BUN 16  CREATININE 0.86  CALCIUM 9.3  MG --  PHOS --   Liver Function Tests:  Lab 08/24/12 1545  AST 14  ALT 12  ALKPHOS 54  BILITOT 0.2*  PROT 6.6  ALBUMIN 3.3*   CBC:  Lab 08/24/12 1545  WBC 7.4  NEUTROABS 6.0  HGB 12.3  HCT 38.7  MCV 89.6  PLT 272   Cardiac Enzymes:  Lab 08/24/12 1540  CKTOTAL --  CKMB --  CKMBINDEX --  TROPONINI <0.30    BNP (last 3 results)  Basename 06/09/12 1054 03/12/12 1351 03/02/12 1423  PROBNP 245.0  101.6 105.4    Radiological Exams on Admission: Dg Chest 2 View  08/24/2012  IMPRESSION: No acute cardiopulmonary disease.   Original Report Authenticated By: Waynard Reeds, M.D.     EKG: Independently reviewed. Normal sinus rhythm. No evidence of ST elevations, depressions or T wave inversions  Assessment/Plan Principal Problem:  *Near syncope: Most likely cause is dehydration from urinary tract infection. Nevertheless we'll observe overnight on telemetry. Otherwise looked to be stable. Up with assistance.: Stable.  Active Problems:  HYPERLIPIDEMIA-MIXED: Stable.   CAD (coronary artery disease): Stable.   HTN (hypertension): Holding blood pressure medications until her blood pressure is a bit more steady.   UTI (urinary tract infection): Continue IV Rocephin.   Restless leg syndrome: Holding neuropathic medications given concerns for lower blood pressure.   Hypotension: Secondary to UTI and and treated with IV fluids.  Code Status: Full code  Family Communication: Care plan discussed with patient. Will try to reach out to son by telephone Disposition Plan: Suspect patient can likely go home tomorrow.  Time spent: 30 minutes  Hollice Espy [facility] Pager [phone]  If 7PM-7AM, please contact  night-coverage [email or web address] Password [facility] 08/24/2012, 6:46 PM

## 2012-08-24 NOTE — ED Notes (Addendum)
Son requesting a call when pt transferred to floor. [name]: [phone]

## 2012-08-24 NOTE — ED Notes (Signed)
Patient states weakness at home, patient family states patient got increasingly weak today leading of episode where family had to catch patient.  Patient's family reported home bp machine reading in 50's and 60's systolic.  Patient more alert and able to answer questions appropriately at time of arrival, VSS at time of arrival

## 2012-08-25 DIAGNOSIS — N390 Urinary tract infection, site not specified: Secondary | ICD-10-CM

## 2012-08-25 LAB — CBC
Hemoglobin: 12.2 g/dL (ref 12.0–15.0)
MCHC: 32.2 g/dL (ref 30.0–36.0)
Platelets: 271 10*3/uL (ref 150–400)
RDW: 14.6 % (ref 11.5–15.5)

## 2012-08-25 LAB — BASIC METABOLIC PANEL
BUN: 12 mg/dL (ref 6–23)
Calcium: 8.8 mg/dL (ref 8.4–10.5)
GFR calc Af Amer: 86 mL/min — ABNORMAL LOW (ref >90)
GFR calc non Af Amer: 74 mL/min — ABNORMAL LOW (ref >90)
Potassium: 3.9 mEq/L (ref 3.5–5.1)

## 2012-08-25 LAB — PROTIME-INR
INR: 2.56 — ABNORMAL HIGH (ref 0.00–1.49)
Prothrombin Time: 26.3 seconds — ABNORMAL HIGH (ref 11.6–15.2)

## 2012-08-25 MED ORDER — LORAZEPAM 0.5 MG PO TABS: 0.2500 mg | ORAL_TABLET | Freq: Three times a day (TID) | ORAL | Status: DC | PRN

## 2012-08-25 MED ORDER — TRAMADOL HCL 50 MG PO TABS
25.0000 mg | ORAL_TABLET | Freq: Once | ORAL | Status: DC
  Filled 2012-08-25: qty 1

## 2012-08-25 MED ORDER — PREDNISONE 1 MG PO TABS
2.0000 mg | ORAL_TABLET | Freq: Every day | ORAL | Status: DC
  Filled 2012-08-25: qty 2

## 2012-08-25 MED ORDER — LOSARTAN POTASSIUM 25 MG PO TABS
25.0000 mg | ORAL_TABLET | Freq: Every day | ORAL | Status: DC
  Administered 2012-08-25: 25 mg via ORAL
  Filled 2012-08-25: qty 1

## 2012-08-25 MED ORDER — FERROUS SULFATE 325 (65 FE) MG PO TBEC
325.0000 mg | DELAYED_RELEASE_TABLET | Freq: Every day | ORAL | Status: DC
  Filled 2012-08-25: qty 1

## 2012-08-25 MED ORDER — CIPROFLOXACIN HCL 500 MG PO TABS: 500.0000 mg | ORAL_TABLET | Freq: Two times a day (BID) | ORAL | Status: DC

## 2012-08-25 MED ORDER — ROPINIROLE HCL 0.5 MG PO TABS
0.5000 mg | ORAL_TABLET | Freq: Two times a day (BID) | ORAL | Status: DC
  Administered 2012-08-25: 0.5 mg via ORAL
  Filled 2012-08-25 (×2): qty 1

## 2012-08-25 MED ORDER — AMLODIPINE BESYLATE 5 MG PO TABS
5.0000 mg | ORAL_TABLET | Freq: Every day | ORAL | Status: DC
  Administered 2012-08-25: 5 mg via ORAL
  Filled 2012-08-25: qty 1

## 2012-08-25 NOTE — Progress Notes (Signed)
PATIENT DETAILS Name: [name] [name] Age: 78 y.o. Sex: female Date of Birth: [birth year] Admit Date: 08/24/2012 Admitting Physician Hollice Espy, MD GEX:[id number],LKGMWN DAVIDSON, MD  Subjective: Feels much bette -"when can I go home?". Ambulated in the hallway with a walker  Assessment/Plan: Principal Problem:  *Near syncope -2/2 to transient hypotension -resolved with correction of hypotension  Active Problems: UTI -recurrent issue -Urine cx-pending-however may be sterile-as patient was already on Nitrofurantoin for 2 days as outpatient -c/w Rocephin -possible d/c once family comes later today-will then transition to Cipro  Hypotension -multifactorial-UTI/Anti-hypertensives -resolved with IVF -have restarted anti-hypertensives today-monitor  PMR -on chronic steroids-resume  HTN -restart Amlodipine and Losartan-at half dose-today   HYPERLIPIDEMIA-MIXED -resume Lipitor on d/c  CAD -stable  H/o VTE -INR therapeutic -coumadin dosed by pharmacy  Chronic Diarrhea -at baseline  Restless Leg Syndrome -c/w Requip  Dementia -at baseline  Disposition: Remain inpatient-await arrival of family-possible d/c later today  DVT Prophylaxis: Prophylactic Lovenox or Heparin  Code Status: Full code   Procedures:  None  CONSULTS:  None  PHYSICAL EXAM: Vital signs in last 24 hours: Filed Vitals:   08/24/12 2039 08/25/12 0006 08/25/12 0556 08/25/12 1214  BP: 168/74 146/73 145/66 150/85  Pulse: 60  58   Temp: 97.6 F (36.4 C)  97.7 F (36.5 C)   TempSrc: Oral  Oral   Resp: 18  20   Height: 5\' 2"  (1.575 m)     Weight: 66.9 kg (147 lb 7.8 oz)     SpO2: 97%  98%     Weight change:  Body mass index is 26.98 kg/(m^2).   Gen Exam: Awake and alert with clear speech.   Neck: Supple, No JVD.   Chest: B/L Clear.   CVS: S1 S2 Regular, no murmurs.  Abdomen: soft, BS +, non tender, non distended.  Extremities: no edema, lower extremities warm to  touch. Neurologic: Non Focal.   Skin: No Rash.  Wounds: N/A.    Intake/Output from previous day: No intake or output data in the 24 hours ending 08/25/12 1254   LAB RESULTS: CBC  Lab 08/25/12 0440 08/24/12 1545  WBC 7.3 7.4  HGB 12.2 12.3  HCT 37.9 38.7  PLT 271 272  MCV 89.0 89.6  MCH 28.6 28.5  MCHC 32.2 31.8  RDW 14.6 14.5  LYMPHSABS -- 1.0  MONOABS -- 0.4  EOSABS -- 0.1  BASOSABS -- 0.0  BANDABS -- --    Chemistries   Lab 08/25/12 0440 08/24/12 1545  NA 138 140  K 3.9 4.3  CL 107 108  CO2 21 22  GLUCOSE 83 156*  BUN 12 16  CREATININE 0.72 0.86  CALCIUM 8.8 9.3  MG -- --    CBG: No results found for this basename: GLUCAP:5 in the last 168 hours  GFR Estimated Creatinine Clearance: 42.7 ml/min (by C-G formula based on Cr of 0.72).  Coagulation profile  Lab 08/25/12 0440 08/24/12 1545  INR 2.56* 2.51*  PROTIME -- --    Cardiac Enzymes  Lab 08/24/12 1540  CKMB --  TROPONINI <0.30  MYOGLOBIN --    No components found with this basename: POCBNP:3 No results found for this basename: DDIMER:2 in the last 72 hours No results found for this basename: HGBA1C:2 in the last 72 hours No results found for this basename: CHOL:2,HDL:2,LDLCALC:2,TRIG:2,CHOLHDL:2,LDLDIRECT:2 in the last 72 hours No results found for this basename: TSH,T4TOTAL,FREET3,T3FREE,THYROIDAB in the last 72 hours No results found for this basename: VITAMINB12:2,FOLATE:2,FERRITIN:2,TIBC:2,IRON:2,RETICCTPCT:2 in the last 72  hours No results found for this basename: LIPASE:2,AMYLASE:2 in the last 72 hours  Urine Studies No results found for this basename: UACOL:2,UAPR:2,USPG:2,UPH:2,UTP:2,UGL:2,UKET:2,UBIL:2,UHGB:2,UNIT:2,UROB:2,ULEU:2,UEPI:2,UWBC:2,URBC:2,UBAC:2,CAST:2,CRYS:2,UCOM:2,BILUA:2 in the last 72 hours  MICROBIOLOGY: Recent Results (from the past 240 hour(s))  GRAM STAIN     Status: Normal   Collection Time   08/24/12  4:59 PM      Component Value Range Status Comment    Specimen Description URINE, CLEAN CATCH   Final    Special Requests NONE   Final    Gram Stain     Final    Value: CYTOSPIN PREP     WBC PRESENT,BOTH PMN AND MONONUCLEAR     POSITIVE FOR GRAM NEGATIVE RODS     NO YEAST OR FUNGAL ELEMENTS SEEN   Report Status 08/24/2012 FINAL   Final     RADIOLOGY STUDIES/RESULTS: Dg Chest 2 View  08/24/2012  *RADIOLOGY REPORT*  Clinical Data: Shortness of breath, hypotension, weakness, UTI  CHEST - 2 VIEW  Comparison: 06/09/2012; 05/25/2012; chest CT - 06/09/2012  Findings:  Grossly unchanged cardiac silhouette and mediastinal contours. There is persistent mild elevation of the right hemidiaphragm. Linear heterogeneous opacities within the right middle lobe are favored to represent atelectasis.  No focal parenchymal opacities. No pleural effusion or pneumothorax. Grossly unchanged bones including mid and lower thoracic spine compression deformities.  IMPRESSION: No acute cardiopulmonary disease.   Original Report Authenticated By: Waynard Reeds, M.D.     MEDICATIONS: Scheduled Meds:   . sodium chloride   Intravenous STAT  . amLODipine  5 mg Oral Daily  . cefTRIAXone (ROCEPHIN)  IV  1 g Intravenous Once  . cefTRIAXone (ROCEPHIN)  IV  1 g Intravenous Q24H  . donepezil  10 mg Oral q morning - 10a  . ferrous sulfate  325 mg Oral Q breakfast  . losartan  25 mg Oral Daily  . pantoprazole  40 mg Oral Daily  . predniSONE  2 mg Oral Daily  . rOPINIRole  0.5 mg Oral BID  . sodium chloride  500 mL Intravenous Once  . sodium chloride  3 mL Intravenous Q12H  . traMADol  25 mg Oral Once  . warfarin  2.5 mg Oral QPM  . Warfarin - Physician Dosing Inpatient   Does not apply q1800   Continuous Infusions:   . DISCONTD: sodium chloride 100 mL/hr at 08/24/12 2032   PRN Meds:.acetaminophen, acetaminophen, LORazepam, ondansetron (ZOFRAN) IV, ondansetron, zolpidem, DISCONTD: NON FORMULARY 10 mg  Antibiotics: Anti-infectives     Start     Dose/Rate Route  Frequency Ordered Stop   08/25/12 1730   cefTRIAXone (ROCEPHIN) 1 g in dextrose 5 % 50 mL IVPB        1 g 100 mL/hr over 30 Minutes Intravenous Every 24 hours 08/24/12 2029     08/24/12 1730   cefTRIAXone (ROCEPHIN) 1 g in dextrose 5 % 50 mL IVPB        1 g 100 mL/hr over 30 Minutes Intravenous  Once 08/24/12 1728 08/24/12 2034           Jeoffrey Massed, MD  [facility] Pager:[phone]  If 7PM-7AM, please contact night-coverage [email or web address] Password [email or web address] 08/25/2012, 12:54 PM   LOS: 1 day

## 2012-08-25 NOTE — Progress Notes (Signed)
NURSING PROGRESS NOTE  [name] [name] [id number] Discharge Data: 08/25/2012 4:27 PM Attending Provider: No att. providers found JXB:JYNWG,NFAOZH [name] Palma, MD     [name] [name] to be D/C'd Home per MD order.  Discussed with the patient the After Visit Summary and all questions fully answered. All IV's discontinued with no bleeding noted. All belongings returned to patient for patient to take home.   Last Vital Signs:  Blood pressure 150/85, pulse 58, temperature 97.7 F (36.5 C), temperature source Oral, resp. rate 20, height 5\' 2"  (1.575 m), weight 66.9 kg (147 lb 7.8 oz), SpO2 98.00%.  Discharge Medication List   Medication List     As of 08/25/2012  4:27 PM    STOP taking these medications         nitrofurantoin 100 MG capsule   Commonly known as: MACRODANTIN      TAKE these medications         acetaminophen 500 MG tablet   Commonly known as: TYLENOL   Take 500 mg by mouth every 6 (six) hours as needed. For pain      amLODipine 10 MG tablet   Commonly known as: NORVASC   Take 10 mg by mouth daily.      atorvastatin 20 MG tablet   Commonly known as: LIPITOR   Take 10 mg by mouth at bedtime. 1/2 tab daily      benzonatate 100 MG capsule   Commonly known as: TESSALON   Take 100 mg by mouth 3 (three) times daily as needed.      cholecalciferol 1000 UNITS tablet   Commonly known as: VITAMIN D   Take 2,000 Units by mouth daily.      ciprofloxacin 500 MG tablet   Commonly known as: CIPRO   Take 1 tablet (500 mg total) by mouth 2 (two) times daily.      donepezil 10 MG tablet   Commonly known as: ARICEPT   Take 10 mg by mouth every morning.      ferrous sulfate 325 (65 FE) MG EC tablet   Take 325 mg by mouth daily with breakfast.      LORazepam 0.5 MG tablet   Commonly known as: ATIVAN   Take 0.5 tablets (0.25 mg total) by mouth 3 (three) times daily as needed for anxiety. for anxiety      losartan 50 MG tablet   Commonly known as: COZAAR   Take 50 mg by  mouth daily.      Melatonin 3 MG Tabs   Take 3 mg by mouth daily.      multivitamin with minerals Tabs   Take 1 tablet by mouth daily.      NEXIUM 40 MG capsule   Generic drug: esomeprazole   Take 40 mg by mouth daily before breakfast.      nitroGLYCERIN 0.4 MG SL tablet   Commonly known as: NITROSTAT   Place 0.4 mg under the tongue every 5 (five) minutes x 3 doses as needed. For chest pain      potassium chloride SA 20 MEQ tablet   Commonly known as: K-DUR,KLOR-CON   Take 20 mEq by mouth daily.      predniSONE 1 MG tablet   Commonly known as: DELTASONE   Take 2 mg by mouth daily.      rOPINIRole 0.5 MG tablet   Commonly known as: REQUIP   Take 1 tablet (0.5 mg total) by mouth 2 (two) times daily. Late afternoon and 3 hours  before bed time      traMADol 50 MG tablet   Commonly known as: ULTRAM   Take 25 mg by mouth every 6 (six) hours as needed. For pain      warfarin 2.5 MG tablet   Commonly known as: COUMADIN   Take 2.5 mg by mouth every evening.      zaleplon 10 MG capsule   Commonly known as: SONATA   Take 1 capsule (10 mg total) by mouth at bedtime. Facility MD to refill        Harlon Flor, Elmarie Mainland, RN

## 2012-08-25 NOTE — ED Provider Notes (Signed)
I have supervised the resident on the management of this patient and agree with the note above. I personally interviewed and examined the patient and my addendum is below.   [name] [name] is a 78 y.o. female hx of CAD with stents, HTN, recurrent UTI here with syncope. She was eating lunch and passed out. She was hypotensive as per family at the time. She had recurrent episodes this year and was diagnosed with UTI with the previous episode. Patient's mental status is at baseline and there were no head injury. Neuro exam non focal. Patient is not hypotensive or febrile in the ED. WBC nl, CMP unremarkable. UA + UTI. She was given rocephin and admitted for UTI.    Richardean Canal, MD 08/25/12 [phone]

## 2012-08-25 NOTE — Progress Notes (Signed)
[name] [name] [id number] Code Status: Full Admission Data: 08/25/2012 12:13 AM Attending Provider:  Ghimire HYQ:MVHQI,ONGEXB DAVIDSON, MD Consults/ Treatment Team:    [name] [name] is a 78 y.o. female patient admitted from ED awake, alert - oriented  X 3 - no acute distress noted.  VSS - Blood pressure 168/74, pulse 60, temperature 97.6 F (36.4 C), temperature source Oral, resp. rate 18, height 5\' 2"  (1.575 m), weight 66.9 kg (147 lb 7.8 oz), SpO2 97.00%.  no c/o shortness of breath, no c/o chest pain. Cardiac tele # [phone], in place, cardiac monitor yields:normal sinus rhythm. IV Fluids:  IV in place, occlusive dsg intact without redness, IV cath upper arm right, condition patent and no redness normal saline.  Allergies:   Allergies  Allergen Reactions  . Codeine Phosphate Nausea And Vomiting  . Darvocet (Propoxyphene-Acetaminophen) Nausea And Vomiting  . Promethazine Hcl Nausea And Vomiting  . Sulfamethoxazole Other (See Comments)    Pt doesn't remember reaction  . Amoxicillin Nausea And Vomiting and Rash  . Penicillins Nausea And Vomiting and Rash     Past Medical History  Diagnosis Date  . CAD (coronary artery disease)     S/p PTCA / stenting (last cath 2004, multiple LAD stents, 2 stents in the right coronary artery all patent)  . 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 Started coumadin  . Breast CA 2005-2006    S/p lumpectomy and radiation  . HTN (hypertension)   . Restless leg syndrome   . PMR (polymyalgia rheumatica)   . Axillary abscess    Medications Prior to Admission  Medication Sig Dispense Refill  . acetaminophen (TYLENOL) 500 MG tablet Take 500 mg by mouth every 6 (six) hours as needed. For pain      . amLODipine (NORVASC) 10 MG tablet Take 10 mg by mouth daily.      Marland Kitchen atorvastatin (LIPITOR) 20 MG tablet Take 10 mg by mouth at bedtime. 1/2 tab daily      . benzonatate (TESSALON) 100 MG capsule Take 100 mg by mouth 3 (three) times daily as  needed.      . cholecalciferol (VITAMIN D) 1000 UNITS tablet Take 2,000 Units by mouth daily.      Marland Kitchen donepezil (ARICEPT) 10 MG tablet Take 10 mg by mouth every morning.      . ferrous sulfate 325 (65 FE) MG EC tablet Take 325 mg by mouth daily with breakfast.      . LORazepam (ATIVAN) 0.5 MG tablet Take 0.5 tablets (0.25 mg total) by mouth 3 (three) times daily as needed for anxiety. for anxiety  30 tablet  0  . losartan (COZAAR) 50 MG tablet Take 50 mg by mouth daily.      . Melatonin 3 MG TABS Take 3 mg by mouth daily.      . Multiple Vitamin (MULITIVITAMIN WITH MINERALS) TABS Take 1 tablet by mouth daily.      Marland Kitchen NEXIUM 40 MG capsule Take 40 mg by mouth daily before breakfast.       . nitrofurantoin (MACRODANTIN) 100 MG capsule Take 100 mg by mouth 2 (two) times daily.      . nitroGLYCERIN (NITROSTAT) 0.4 MG SL tablet Place 0.4 mg under the tongue every 5 (five) minutes x 3 doses as needed. For chest pain      . potassium chloride SA (K-DUR,KLOR-CON) 20 MEQ tablet Take 20 mEq by mouth daily.      . predniSONE (DELTASONE) 1 MG  tablet Take 2 mg by mouth daily.      Marland Kitchen rOPINIRole (REQUIP) 0.5 MG tablet Take 1 tablet (0.5 mg total) by mouth 2 (two) times daily. Late afternoon and 3 hours before bed time      . traMADol (ULTRAM) 50 MG tablet Take 25 mg by mouth every 6 (six) hours as needed. For pain      . warfarin (COUMADIN) 2.5 MG tablet Take 2.5 mg by mouth every evening.       . zaleplon (SONATA) 10 MG capsule Take 1 capsule (10 mg total) by mouth at bedtime. Facility MD to refill  10 capsule  0   History:  obtained from the patient. Tobacco/alcohol: denied none  Orientation to room, and floor completed with information packet given to patient/family.  Patient declined safety video at this time.  Admission INP armband ID verified with patient/family, and in place.   SR up x 2, fall assessment complete, with patient and family able to verbalize understanding of risk associated with falls, and  verbalized understanding to call nsg before up out of bed.  Call light within reach, patient able to voice, and demonstrate understanding.  Skin, clean-dry- intact without evidence of skin tears.  Bruising throughout body d/t being on coumadin.  No evidence of skin break down noted on exam.     Will cont to eval and treat per MD orders.  Orvan Seen, RN 08/25/2012 12:13 AM

## 2012-08-25 NOTE — Discharge Summary (Signed)
PNoneNoneATIENT DETAILS Name: [name] [name] Age: 78 y.o. Sex: female Date of Birth: Jul 17, 1921 MRN: [id number]. Admit Date: 08/24/2012 Admitting Physician: Hollice Espy, MD AOZ:HYQMV,HQIONG DAVIDSON, MD  Recommendations for Outpatient Follow-up:  1. Follow up urine culture results 2. Watch INR while on Ciprofloxacin  PRIMARY DISCHARGE DIAGNOSIS:  Principal Problem:  *Near syncope Active Problems:  HYPERLIPIDEMIA-MIXED  CAD (coronary artery disease)  HTN (hypertension)  UTI (urinary tract infection)  Restless leg syndrome  Hypotension      PAST MEDICAL HISTORY: Past Medical History  Diagnosis Date  . CAD (coronary artery disease)     S/p PTCA / stenting (last cath 2004, multiple LAD stents, 2 stents in the right coronary artery all patent)  . 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 Started coumadin  . Breast CA 2005-2006    S/p lumpectomy and radiation  . HTN (hypertension)   . Restless leg syndrome   . PMR (polymyalgia rheumatica)   . Axillary abscess     DISCHARGE MEDICATIONS:   Medication List     As of 08/25/2012  2:42 PM    STOP taking these medications         nitrofurantoin 100 MG capsule   Commonly known as: MACRODANTIN      TAKE these medications         acetaminophen 500 MG tablet   Commonly known as: TYLENOL   Take 500 mg by mouth every 6 (six) hours as needed. For pain      amLODipine 10 MG tablet   Commonly known as: NORVASC   Take 10 mg by mouth daily.      atorvastatin 20 MG tablet   Commonly known as: LIPITOR   Take 10 mg by mouth at bedtime. 1/2 tab daily      benzonatate 100 MG capsule   Commonly known as: TESSALON   Take 100 mg by mouth 3 (three) times daily as needed.      cholecalciferol 1000 UNITS tablet   Commonly known as: VITAMIN D   Take 2,000 Units by mouth daily.      ciprofloxacin 500 MG tablet   Commonly known as: CIPRO   Take 1 tablet (500 mg total) by mouth 2 (two) times daily.     donepezil 10 MG tablet   Commonly known as: ARICEPT   Take 10 mg by mouth every morning.      ferrous sulfate 325 (65 FE) MG EC tablet   Take 325 mg by mouth daily with breakfast.      LORazepam 0.5 MG tablet   Commonly known as: ATIVAN   Take 0.5 tablets (0.25 mg total) by mouth 3 (three) times daily as needed for anxiety. for anxiety      losartan 50 MG tablet   Commonly known as: COZAAR   Take 50 mg by mouth daily.      Melatonin 3 MG Tabs   Take 3 mg by mouth daily.      multivitamin with minerals Tabs   Take 1 tablet by mouth daily.      NEXIUM 40 MG capsule   Generic drug: esomeprazole   Take 40 mg by mouth daily before breakfast.      nitroGLYCERIN 0.4 MG SL tablet   Commonly known as: NITROSTAT   Place 0.4 mg under the tongue every 5 (five) minutes x 3 doses as needed. For chest pain      potassium chloride SA 20 MEQ tablet   Commonly known  as: K-DUR,KLOR-CON   Take 20 mEq by mouth daily.      predniSONE 1 MG tablet   Commonly known as: DELTASONE   Take 2 mg by mouth daily.      rOPINIRole 0.5 MG tablet   Commonly known as: REQUIP   Take 1 tablet (0.5 mg total) by mouth 2 (two) times daily. Late afternoon and 3 hours before bed time      traMADol 50 MG tablet   Commonly known as: ULTRAM   Take 25 mg by mouth every 6 (six) hours as needed. For pain      warfarin 2.5 MG tablet   Commonly known as: COUMADIN   Take 2.5 mg by mouth every evening.      zaleplon 10 MG capsule   Commonly known as: SONATA   Take 1 capsule (10 mg total) by mouth at bedtime. Facility MD to refill         BRIEF HPI:  See H&P, Labs, Consult and Test reports for all details in brief, patient was admitted for a presyncopal episode. She was found to have a systolic blood pressure of 59 by EMS. She was then admitted to the hospital for further evaluation and treatment. For further details please see the history and physical that was done on admission.  CONSULTATIONS:    None  PERTINENT RADIOLOGIC STUDIES: Dg Chest 2 View  08/24/2012  *RADIOLOGY REPORT*  Clinical Data: Shortness of breath, hypotension, weakness, UTI  CHEST - 2 VIEW  Comparison: 06/09/2012; 05/25/2012; chest CT - 06/09/2012  Findings:  Grossly unchanged cardiac silhouette and mediastinal contours. There is persistent mild elevation of the right hemidiaphragm. Linear heterogeneous opacities within the right middle lobe are favored to represent atelectasis.  No focal parenchymal opacities. No pleural effusion or pneumothorax. Grossly unchanged bones including mid and lower thoracic spine compression deformities.  IMPRESSION: No acute cardiopulmonary disease.   Original Report Authenticated By: Waynard Reeds, M.D.      PERTINENT LAB RESULTS: CBC:  Basename 08/25/12 0440 08/24/12 1545  WBC 7.3 7.4  HGB 12.2 12.3  HCT 37.9 38.7  PLT 271 272   CMET CMP     Component Value Date/Time   NA 138 08/25/2012 0440   K 3.9 08/25/2012 0440   CL 107 08/25/2012 0440   CO2 21 08/25/2012 0440   GLUCOSE 83 08/25/2012 0440   BUN 12 08/25/2012 0440   CREATININE 0.72 08/25/2012 0440   CALCIUM 8.8 08/25/2012 0440   PROT 6.6 08/24/2012 1545   ALBUMIN 3.3* 08/24/2012 1545   AST 14 08/24/2012 1545   ALT 12 08/24/2012 1545   ALKPHOS 54 08/24/2012 1545   BILITOT 0.2* 08/24/2012 1545   GFRNONAA 74* 08/25/2012 0440   GFRAA 86* 08/25/2012 0440    GFR Estimated Creatinine Clearance: 42.7 ml/min (by C-G formula based on Cr of 0.72). No results found for this basename: LIPASE:2,AMYLASE:2 in the last 72 hours  Basename 08/24/12 1540  CKTOTAL --  CKMB --  CKMBINDEX --  TROPONINI <0.30   No components found with this basename: POCBNP:3 No results found for this basename: DDIMER:2 in the last 72 hours No results found for this basename: HGBA1C:2 in the last 72 hours No results found for this basename: CHOL:2,HDL:2,LDLCALC:2,TRIG:2,CHOLHDL:2,LDLDIRECT:2 in the last 72 hours No results found for this  basename: TSH,T4TOTAL,FREET3,T3FREE,THYROIDAB in the last 72 hours No results found for this basename: VITAMINB12:2,FOLATE:2,FERRITIN:2,TIBC:2,IRON:2,RETICCTPCT:2 in the last 72 hours Coags:  Basename 08/25/12 0440 08/24/12 1545  INR 2.56* 2.51*  Microbiology: Recent Results (from the past 240 hour(s))  GRAM STAIN     Status: Normal   Collection Time   08/24/12  4:59 PM      Component Value Range Status Comment   Specimen Description URINE, CLEAN CATCH   Final    Special Requests NONE   Final    Gram Stain     Final    Value: CYTOSPIN PREP     WBC PRESENT,BOTH PMN AND MONONUCLEAR     POSITIVE FOR GRAM NEGATIVE RODS     NO YEAST OR FUNGAL ELEMENTS SEEN   Report Status 08/24/2012 FINAL   Final   URINE CULTURE     Status: Normal (Preliminary result)   Collection Time   08/24/12  4:59 PM      Component Value Range Status Comment   Specimen Description URINE, CLEAN CATCH   Final    Special Requests NONE   Final    Culture  Setup Time 08/24/2012 17:13   Final    Colony Count 75,000 COLONIES/ML   Final    Culture GRAM NEGATIVE RODS   Final    Report Status PENDING   Incomplete      BRIEF HOSPITAL COURSE:   Principal Problem: Near syncope  -2/2 to transient hypotension  -resolved with correction of hypotension  Active Problems:   UTI  -recurrent issue  -Urine cx-pending-however may be sterile-as patient was already on Nitrofurantoin for 2 days as outpatient -will discharge on Cipro -patient to follow up Urine cx results at PCP's office on Monday  Hypotension  -multifactorial-UTI/Anti-hypertensives  -resolved with IVF  -have restarted anti-hypertensives today-monitor  PMR  -on chronic steroids-resume   HTN  -restart Amlodipine and Losartan on discharge  HYPERLIPIDEMIA-MIXED  -resume Lipitor on d/c   CAD  -stable   H/o VTE  -INR therapeutic -on coumadin -have asked son Gerlene Burdock to see if patient can get a INR check this coming Monday-10/28-as patient will be  on Cipro which can increase the INR. If INR increased on Monday-then coumadin dosing will need to be adjusted. He claimed understanding  Chronic Diarrhea  -at baseline   Restless Leg Syndrome  -c/w Requip   TODAY-DAY OF DISCHARGE:  Subjective:   Zion Lint today has no headache,no chest abdominal pain,no new weakness tingling or numbness, feels much better wants to go home today.   Objective:   Blood pressure 150/85, pulse 58, temperature 97.7 F (36.5 C), temperature source Oral, resp. rate 20, height 5\' 2"  (1.575 m), weight 66.9 kg (147 lb 7.8 oz), SpO2 98.00%. No intake or output data in the 24 hours ending 08/25/12 1442  Exam Awake Alert, Oriented *3, No new F.N deficits, Normal affect [place].AT,PERRAL Supple Neck,No JVD, No cervical lymphadenopathy appriciated.  Symmetrical Chest wall movement, Good air movement bilaterally, CTAB RRR,No Gallops,Rubs or new Murmurs, No Parasternal Heave +ve B.Sounds, Abd Soft, Non tender, No organomegaly appriciated, No rebound -guarding or rigidity. No Cyanosis, Clubbing or edema, No new Rash or bruise  DISCHARGE CONDITION: Stable  DISPOSITION: SNF ALF  HOME  DISCHARGE INSTRUCTIONS:    Activity:  As tolerated with Full fall precautions use walker/cane & assistance as needed  Diet recommendation: Heart Healthy diet       Follow-up Information    Follow up with [name], MD. Schedule an appointment as soon as possible for a visit in 5 days.   Contact information:   [address] [place] [address] [phone]  Total Time spent on discharge equals 45 minutes.  SignedJeoffrey Massed 08/25/2012 2:42 PM

## 2012-08-25 NOTE — Evaluation (Signed)
Physical Therapy Evaluation Patient Details Name: [name] [name] MRN: [id number] DOB: [birth year] Today's Date: 08/25/2012 Time: 1220-1238 PT Time Calculation (min): 18 min  PT Assessment / Plan / Recommendation Clinical Impression  78 yo adm with hypotension, presyncope and found to have UTI. Pt moving well and states she is at her baseline.    PT Assessment  Patent does not need any further PT services    Follow Up Recommendations  No PT follow up;Supervision for mobility/OOB    Does the patient have the potential to tolerate intense rehabilitation      Barriers to Discharge        Equipment Recommendations  None recommended by PT    Recommendations for Other Services     Frequency      Precautions / Restrictions Precautions Precautions: None Restrictions Weight Bearing Restrictions: No   Pertinent Vitals/Pain Denied pain BP sitting 141/74 BP after walk 152/78      Mobility  Bed Mobility Bed Mobility: Not assessed Transfers Transfers: Sit to Stand;Stand to Sit Sit to Stand: 5: Supervision;With upper extremity assist;With armrests;From chair/3-in-1 Stand to Sit: 5: Supervision;With upper extremity assist;With armrests;To chair/3-in-1 Details for Transfer Assistance: supervision for safe use of RW/hand placement Ambulation/Gait Ambulation/Gait Assistance: 5: Supervision Ambulation Distance (Feet): 100 Feet Assistive device: Rolling walker Ambulation/Gait Assistance Details: steady with RW; slight flexed posture; slow cadence Gait Pattern: Step-through pattern;Trunk flexed    Shoulder Instructions     Exercises     PT Diagnosis:    PT Problem List:   PT Treatment Interventions:     PT Goals    Visit Information  Last PT Received On: 08/25/12 Assistance Needed: +1    Subjective Data  Subjective: Pt reports no dizziness while walking Patient Stated Goal: return home today   Prior Functioning  Home Living Lives With: Son;Other (Comment) (niece  helps 8a-2p ) Available Help at Discharge: Family Type of Home: House Home Access: Stairs to enter Secretary/administrator of Steps: 1 Entrance Stairs-Rails: None Home Layout: One level Bathroom Shower/Tub: Forensic scientist: Standard Bathroom Accessibility: Yes How Accessible: Accessible via walker Home Adaptive Equipment: Grab bars around toilet;Hand-held shower hose;Straight cane;Tub transfer bench;Walker - rolling;Bedside commode/3-in-1 Prior Function Level of Independence: Needs assistance Needs Assistance: Bathing;Meal Prep;Light Housekeeping (stairs, ) Communication Communication: HOH    Cognition  Overall Cognitive Status: Appears within functional limits for tasks assessed/performed Arousal/Alertness: Awake/alert Orientation Level: Oriented X4 / Intact Behavior During Session: [facility] for tasks performed    Extremity/Trunk Assessment Right Lower Extremity Assessment RLE ROM/Strength/Tone: [facility] for tasks assessed Left Lower Extremity Assessment LLE ROM/Strength/Tone: WFL for tasks assessed   Balance    End of Session PT - End of Session Equipment Utilized During Treatment: Gait belt Activity Tolerance: Patient tolerated treatment well Patient left: in chair;with call bell/phone within reach Nurse Communication: Mobility status  GP Functional Assessment Tool Used: clinical judgement Functional Limitation: Mobility: Walking and moving around Mobility: Walking and Moving Around Current Status (W0981): At least 1 percent but less than 20 percent impaired, limited or restricted Mobility: Walking and Moving Around Goal Status [phone]): At least 1 percent but less than 20 percent impaired, limited or restricted Mobility: Walking and Moving Around Discharge Status [phone]): At least 1 percent but less than 20 percent impaired, limited or restricted   [name] [name] 08/25/2012, 12:48 PM  Pager [phone]

## 2012-08-25 NOTE — Care Management Note (Unsigned)
    Page 1 of 1   08/25/2012     12:10:45 PM   CARE MANAGEMENT NOTE 08/25/2012  Patient:  [name] [name], [name] [name]   Account Number:  [id number]  Date Initiated:  08/25/2012  Documentation initiated by:  Letha Cape  Subjective/Objective Assessment:   dx near syncope  admit as observation- lives with son. Patient's neice , [name] [name] is with her from 8am to 2 pm.     Action/Plan:   Anticipated DC Date:  08/25/2012   Anticipated DC Plan:  HOME/SELF CARE      DC Planning Services  CM consult      Choice offered to / List presented to:             Status of service:  Completed, signed off Medicare Important Message given?   (If response is "NO", the following Medicare IM given date fields will be blank) Date Medicare IM given:   Date Additional Medicare IM given:    Discharge Disposition:  HOME/SELF CARE  Per UR Regulation:  Reviewed for med. necessity/level of care/duration of stay  If discussed at Long Length of Stay Meetings, dates discussed:    Comments:  08/25/12 12:04 Letha Cape RN, BSN [phone] patient lives with son, she has a neice, [name] [name] who is with her from 8 am to 2pm and helps her with cooking  and bathing.  Patient states she has a rolling walker that she uses at home and a cane.  Patient has medication coverage and transportation.

## 2012-08-26 LAB — URINE CULTURE

## 2012-09-27 ENCOUNTER — Encounter (HOSPITAL_COMMUNITY): Payer: Self-pay | Admitting: Surgery

## 2012-09-27 ENCOUNTER — Inpatient Hospital Stay (HOSPITAL_COMMUNITY)
Admission: AD | Admit: 2012-09-27 | Discharge: 2012-10-01 | DRG: 603 | Disposition: A | Discharge status: Home Health | Payer: Medicare Other | Source: Ambulatory Visit | Attending: General Surgery | Admitting: General Surgery

## 2012-09-27 ENCOUNTER — Ambulatory Visit (INDEPENDENT_AMBULATORY_CARE_PROVIDER_SITE_OTHER): Payer: Medicare Other | Admitting: Surgery

## 2012-09-27 ENCOUNTER — Encounter (INDEPENDENT_AMBULATORY_CARE_PROVIDER_SITE_OTHER): Payer: Self-pay | Admitting: Surgery

## 2012-09-27 VITALS — BP 130/84 | HR 70 | Temp 97.0°F | Ht 62.0 in | Wt 153.2 lb

## 2012-09-27 DIAGNOSIS — A4902 Methicillin resistant Staphylococcus aureus infection, unspecified site: Secondary | ICD-10-CM | POA: Diagnosis present

## 2012-09-27 DIAGNOSIS — E782 Mixed hyperlipidemia: Secondary | ICD-10-CM | POA: Diagnosis present

## 2012-09-27 DIAGNOSIS — M353 Polymyalgia rheumatica: Secondary | ICD-10-CM | POA: Diagnosis present

## 2012-09-27 DIAGNOSIS — L02412 Cutaneous abscess of left axilla: Secondary | ICD-10-CM

## 2012-09-27 DIAGNOSIS — I2510 Atherosclerotic heart disease of native coronary artery without angina pectoris: Secondary | ICD-10-CM | POA: Diagnosis present

## 2012-09-27 DIAGNOSIS — G2581 Restless legs syndrome: Secondary | ICD-10-CM | POA: Diagnosis present

## 2012-09-27 DIAGNOSIS — I10 Essential (primary) hypertension: Secondary | ICD-10-CM | POA: Diagnosis present

## 2012-09-27 DIAGNOSIS — IMO0002 Reserved for concepts with insufficient information to code with codable children: Principal | ICD-10-CM | POA: Diagnosis present

## 2012-09-27 DIAGNOSIS — L02419 Cutaneous abscess of limb, unspecified: Secondary | ICD-10-CM | POA: Insufficient documentation

## 2012-09-27 DIAGNOSIS — I498 Other specified cardiac arrhythmias: Secondary | ICD-10-CM | POA: Diagnosis not present

## 2012-09-27 DIAGNOSIS — E871 Hypo-osmolality and hyponatremia: Secondary | ICD-10-CM | POA: Diagnosis not present

## 2012-09-27 DIAGNOSIS — R350 Frequency of micturition: Secondary | ICD-10-CM | POA: Diagnosis not present

## 2012-09-27 HISTORY — DX: Anxiety disorder, unspecified: F41.9

## 2012-09-27 HISTORY — DX: Unspecified dementia, unspecified severity, without behavioral disturbance, psychotic disturbance, mood disturbance, and anxiety: F03.90

## 2012-09-27 LAB — CBC
HCT: 38 % (ref 36.0–46.0)
Hemoglobin: 12.1 g/dL (ref 12.0–15.0)
MCHC: 31.8 g/dL (ref 30.0–36.0)
MCV: 88.6 fL (ref 78.0–100.0)

## 2012-09-27 LAB — BASIC METABOLIC PANEL
BUN: 17 mg/dL (ref 6–23)
CO2: 22 mEq/L (ref 19–32)
Chloride: 101 mEq/L (ref 96–112)
Creatinine, Ser: 0.79 mg/dL (ref 0.50–1.10)
GFR calc Af Amer: 83 mL/min — ABNORMAL LOW (ref >90)
Glucose, Bld: 100 mg/dL — ABNORMAL HIGH (ref 70–99)
Potassium: 4.3 mEq/L (ref 3.5–5.1)

## 2012-09-27 LAB — PROTIME-INR: INR: 2.43 — ABNORMAL HIGH (ref 0.00–1.49)

## 2012-09-27 MED ORDER — ONDANSETRON HCL 4 MG/2ML IJ SOLN: 4.0000 mg | Freq: Four times a day (QID) | INTRAMUSCULAR | Status: DC | PRN

## 2012-09-27 MED ORDER — LACTATED RINGERS IV SOLN: INTRAVENOUS | Status: DC

## 2012-09-27 MED ORDER — CIPROFLOXACIN IN D5W 400 MG/200ML IV SOLN: 400.0000 mg | Freq: Two times a day (BID) | INTRAVENOUS | Status: DC

## 2012-09-27 MED ORDER — MUPIROCIN 2 % EX OINT
1.0000 "application " | TOPICAL_OINTMENT | Freq: Two times a day (BID) | CUTANEOUS | Status: DC
  Administered 2012-09-28 – 2012-10-01 (×8): 1 via NASAL
  Filled 2012-09-27: qty 22

## 2012-09-27 MED ORDER — CHLORHEXIDINE GLUCONATE CLOTH 2 % EX PADS
6.0000 | MEDICATED_PAD | Freq: Every day | CUTANEOUS | Status: DC
  Administered 2012-09-28 – 2012-10-01 (×4): 6 via TOPICAL

## 2012-09-27 MED ORDER — CIPROFLOXACIN IN D5W 400 MG/200ML IV SOLN
400.0000 mg | Freq: Two times a day (BID) | INTRAVENOUS | Status: DC
  Administered 2012-09-27 – 2012-10-01 (×8): 400 mg via INTRAVENOUS
  Filled 2012-09-27 (×9): qty 200

## 2012-09-27 NOTE — Progress Notes (Signed)
This is a patient that is status post drainage of a left axillary abscess by Dr. Carolynne Edouard 2 months ago. The patient has a very extensive past medical history and is anticoagulated. She required hospitalization for that surgery.  She presents with several days of worsening swelling in both axilla. The left axilla has a large central abscess with several surrounding smaller abscesses. She has at least 2 spots in the right axilla that are worrisome. She has been on her Coumadin daily. Her last INR was greater than 2.5. The patient remains afebrile. Both areas are fairly uncomfortable. She is accompanied by her son.  Filed Vitals:   09/27/12 1547  BP: 130/84  Pulse: 70  Temp: 97 F (36.1 C)    In the right axilla there is a central 1.5-2 cm protruding abscess. There is some overlying skin necrosis. No drainage at this time. Inferiorly there is a smaller 7 mm subcutaneous abscess that is mildly tender. In the left axilla there is a central 2-2.5 cm protruding abscess with some overlying skin necrosis. There are at least 4 surrounding 1 cm abscesses that are also tender to palpation.  Impression: Multiple bilateral axillary abscesses Multiple medical comorbidities Fully anticoagulated  Plan: Will admit patient directly to the hospital. We will ask triad hospitalist to consult to manage her multiple medical issues. We will check a stat INR. She will need incision and drainage of these abscesses either tonight or tomorrow. This will need to be done under anesthesia in the operating room.  I spoke with Dr. Andrey Campanile who is the [facility] D[facility] this week.  Wilmon Arms. Corliss Skains, MD, [facility]  09/27/2012 5:26 PM

## 2012-09-27 NOTE — H&P (Signed)
**Note [name]-Identified via Obfuscation** DEMICA [name] is an 78 y.o. female.   Chief Complaint: Bilateral axillary abscesses HPI: 78 yo female with extensive PMH with previous left axillary abscesses presents with bilateral axillary abscesses - multiple.  She is fully anticoagulated and has been taking her Coumadin.  These abscesses have been present for several days.  Past Medical History  Diagnosis Date  . CAD (coronary artery disease)     S/p PTCA / stenting (last cath 2004, multiple LAD stents, 2 stents in the right coronary artery all patent)  . 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 Started coumadin  . Breast CA 2005-2006    S/p lumpectomy and radiation  . HTN (hypertension)   . Restless leg syndrome   . PMR (polymyalgia rheumatica)   . Axillary abscess     Past Surgical History  Procedure Date  . Colectomy   . Breast lumpectomy   . Stent     cardiac x 8 stents.    History reviewed. No pertinent family history. Social History:  reports that she has never smoked. She has never used smokeless tobacco. She reports that she does not drink alcohol or use illicit drugs.  Allergies:  Allergies  Allergen Reactions  . Codeine Phosphate Nausea And Vomiting  . Darvocet (Propoxyphene-Acetaminophen) Nausea And Vomiting  . Promethazine Hcl Nausea And Vomiting  . Sulfamethoxazole Other (See Comments)    Pt doesn't remember reaction  . Amoxicillin Nausea And Vomiting and Rash  . Penicillins Nausea And Vomiting and Rash   MEDS:  See chart  ROS  Blood pressure 130/84, pulse 70, temperature 97 F (36.1 C), temperature source Temporal, height 5\' 2"  (1.575 m), weight 153 lb 3.2 oz (69.491 kg), SpO2 96.00%. Physical Exam  Elderly female in NAD Multiple abscesses in left axilla - largest is 2 cm across; several other 1 cm abscesses Right axilla - 1.5 cm abscess;1 cm abscess Assessment/Plan Multiple axillary abscesses Anticoagulated Multiple comorbidities   Admit to [facility] - [facility] Hospital[facility] consult to  manage medical problems Check INR To surgery tonight or tomorrow for I&D of abscesses.   [name] Beaston K. 09/27/2012, 4:08 PM

## 2012-09-27 NOTE — Progress Notes (Signed)
CRITICAL VALUE ALERT  Critical value received: Positive MRSA  Date of notification:  09/27/2012  Time of notification:  2110  Critical value read back:yes  Nurse who received alert:  Marcelyn Bruins  Provider on call was not notified there are already standing orders for MRSA positive swabs. Will continue to monitor.

## 2012-09-28 ENCOUNTER — Encounter (HOSPITAL_COMMUNITY): Payer: Self-pay | Admitting: Certified Registered Nurse Anesthetist

## 2012-09-28 ENCOUNTER — Encounter (HOSPITAL_COMMUNITY): Admission: AD | Disposition: A | Discharge status: Home Health | Payer: Self-pay | Source: Ambulatory Visit

## 2012-09-28 ENCOUNTER — Observation Stay (HOSPITAL_COMMUNITY): Payer: Medicare Other | Admitting: Certified Registered Nurse Anesthetist

## 2012-09-28 DIAGNOSIS — IMO0002 Reserved for concepts with insufficient information to code with codable children: Secondary | ICD-10-CM

## 2012-09-28 HISTORY — PX: INCISION AND DRAINAGE ABSCESS: SHX5864

## 2012-09-28 LAB — SURGICAL PCR SCREEN
MRSA, PCR: POSITIVE — AB
Staphylococcus aureus: POSITIVE — AB

## 2012-09-28 LAB — PROTIME-INR: INR: 2.56 — ABNORMAL HIGH (ref 0.00–1.49)

## 2012-09-28 SURGERY — INCISION AND DRAINAGE, ABSCESS
Anesthesia: General | Site: Axilla | Laterality: Bilateral | Wound class: Dirty or Infected

## 2012-09-28 SURGERY — CANCELLED PROCEDURE

## 2012-09-28 MED ORDER — ONDANSETRON HCL 4 MG/2ML IJ SOLN: 4.0000 mg | Freq: Four times a day (QID) | INTRAMUSCULAR | Status: DC | PRN

## 2012-09-28 MED ORDER — OXYCODONE HCL 5 MG PO TABS
5.0000 mg | ORAL_TABLET | ORAL | Status: DC | PRN
  Administered 2012-09-29: 5 mg via ORAL
  Filled 2012-09-28: qty 1

## 2012-09-28 MED ORDER — BUPIVACAINE-EPINEPHRINE PF 0.25-1:200000 % IJ SOLN
INTRAMUSCULAR | Status: AC
  Filled 2012-09-28: qty 30

## 2012-09-28 MED ORDER — VITAMIN K1 10 MG/ML IJ SOLN
10.0000 mg | Freq: Once | INTRAVENOUS | Status: AC
  Administered 2012-09-28: 10 mg via INTRAVENOUS
  Filled 2012-09-28: qty 1

## 2012-09-28 MED ORDER — VANCOMYCIN HCL IN DEXTROSE 1-5 GM/200ML-% IV SOLN
1000.0000 mg | INTRAVENOUS | Status: DC
  Administered 2012-09-28 – 2012-09-30 (×3): 1000 mg via INTRAVENOUS
  Filled 2012-09-28 (×4): qty 200

## 2012-09-28 MED ORDER — ROPINIROLE HCL 0.5 MG PO TABS
0.5000 mg | ORAL_TABLET | Freq: Two times a day (BID) | ORAL | Status: DC
  Administered 2012-09-28 – 2012-10-01 (×6): 0.5 mg via ORAL
  Filled 2012-09-28 (×7): qty 1

## 2012-09-28 MED ORDER — DONEPEZIL HCL 10 MG PO TABS
10.0000 mg | ORAL_TABLET | Freq: Every morning | ORAL | Status: DC
  Administered 2012-09-29 – 2012-10-01 (×3): 10 mg via ORAL
  Filled 2012-09-28 (×3): qty 1

## 2012-09-28 MED ORDER — GLYCOPYRROLATE 0.2 MG/ML IJ SOLN
INTRAMUSCULAR | Status: DC | PRN
  Administered 2012-09-28: 0.4 mg via INTRAVENOUS

## 2012-09-28 MED ORDER — TRAMADOL HCL 50 MG PO TABS
25.0000 mg | ORAL_TABLET | Freq: Four times a day (QID) | ORAL | Status: DC | PRN
  Administered 2012-09-28: 25 mg via ORAL
  Filled 2012-09-28: qty 1

## 2012-09-28 MED ORDER — LACTATED RINGERS IV SOLN
INTRAVENOUS | Status: DC | PRN
  Administered 2012-09-28: 15:00:00 via INTRAVENOUS

## 2012-09-28 MED ORDER — FENTANYL CITRATE 0.05 MG/ML IJ SOLN: 25.0000 ug | INTRAMUSCULAR | Status: DC | PRN

## 2012-09-28 MED ORDER — FENTANYL CITRATE 0.05 MG/ML IJ SOLN
INTRAMUSCULAR | Status: DC | PRN
  Administered 2012-09-28 (×2): 50 ug via INTRAVENOUS

## 2012-09-28 MED ORDER — BUPIVACAINE-EPINEPHRINE 0.25% -1:200000 IJ SOLN
INTRAMUSCULAR | Status: DC | PRN
  Administered 2012-09-28: 10 mL

## 2012-09-28 MED ORDER — ARTIFICIAL TEARS OP OINT
TOPICAL_OINTMENT | OPHTHALMIC | Status: DC | PRN
  Administered 2012-09-28: 1 via OPHTHALMIC

## 2012-09-28 MED ORDER — ATORVASTATIN CALCIUM 10 MG PO TABS
10.0000 mg | ORAL_TABLET | Freq: Every day | ORAL | Status: DC
  Administered 2012-09-28 – 2012-10-01 (×4): 10 mg via ORAL
  Filled 2012-09-28 (×4): qty 1

## 2012-09-28 MED ORDER — ONDANSETRON HCL 4 MG/2ML IJ SOLN
INTRAMUSCULAR | Status: DC | PRN
  Administered 2012-09-28: 4 mg via INTRAVENOUS

## 2012-09-28 MED ORDER — LABETALOL HCL 5 MG/ML IV SOLN
INTRAVENOUS | Status: DC | PRN
  Administered 2012-09-28: 10 mg via INTRAVENOUS

## 2012-09-28 MED ORDER — PROPOFOL 10 MG/ML IV BOLUS
INTRAVENOUS | Status: DC | PRN
  Administered 2012-09-28: 100 mg via INTRAVENOUS

## 2012-09-28 MED ORDER — LIDOCAINE HCL (CARDIAC) 20 MG/ML IV SOLN
INTRAVENOUS | Status: DC | PRN
  Administered 2012-09-28: 50 mg via INTRAVENOUS

## 2012-09-28 MED ORDER — AMLODIPINE BESYLATE 10 MG PO TABS
10.0000 mg | ORAL_TABLET | Freq: Every day | ORAL | Status: DC
  Administered 2012-09-28 – 2012-10-01 (×4): 10 mg via ORAL
  Filled 2012-09-28 (×4): qty 1

## 2012-09-28 MED ORDER — PREDNISONE 1 MG PO TABS
2.0000 mg | ORAL_TABLET | Freq: Every day | ORAL | Status: DC
  Administered 2012-09-28 – 2012-10-01 (×4): 2 mg via ORAL
  Filled 2012-09-28 (×4): qty 2

## 2012-09-28 MED ORDER — LORAZEPAM 0.5 MG PO TABS
0.2500 mg | ORAL_TABLET | Freq: Three times a day (TID) | ORAL | Status: DC | PRN
  Administered 2012-09-28: 0.25 mg via ORAL
  Filled 2012-09-28 (×2): qty 1

## 2012-09-28 MED ORDER — LOSARTAN POTASSIUM 50 MG PO TABS
50.0000 mg | ORAL_TABLET | Freq: Every day | ORAL | Status: DC
  Administered 2012-09-28 – 2012-10-01 (×4): 50 mg via ORAL
  Filled 2012-09-28 (×4): qty 1

## 2012-09-28 MED ORDER — MIRABEGRON ER 25 MG PO TB24
25.0000 mg | ORAL_TABLET | Freq: Every day | ORAL | Status: DC
  Administered 2012-09-28 – 2012-10-01 (×4): 25 mg via ORAL
  Filled 2012-09-28 (×4): qty 1

## 2012-09-28 MED ORDER — NITROGLYCERIN 0.4 MG SL SUBL: 0.4000 mg | SUBLINGUAL_TABLET | SUBLINGUAL | Status: DC | PRN

## 2012-09-28 MED ORDER — ACETAMINOPHEN 500 MG PO TABS
500.0000 mg | ORAL_TABLET | Freq: Four times a day (QID) | ORAL | Status: DC | PRN
  Filled 2012-09-28: qty 1

## 2012-09-28 MED ORDER — 0.9 % SODIUM CHLORIDE (POUR BTL) OPTIME
TOPICAL | Status: DC | PRN
  Administered 2012-09-28: 1000 mL

## 2012-09-28 MED ORDER — POTASSIUM CHLORIDE CRYS ER 20 MEQ PO TBCR
20.0000 meq | EXTENDED_RELEASE_TABLET | Freq: Every day | ORAL | Status: DC
  Administered 2012-09-28 – 2012-10-01 (×4): 20 meq via ORAL
  Filled 2012-09-28 (×4): qty 1

## 2012-09-28 MED ORDER — ZOLPIDEM TARTRATE 5 MG PO TABS
5.0000 mg | ORAL_TABLET | Freq: Every evening | ORAL | Status: DC | PRN
  Administered 2012-09-29 – 2012-09-30 (×2): 5 mg via ORAL
  Filled 2012-09-28 (×2): qty 1

## 2012-09-28 SURGICAL SUPPLY — 35 items
ADH SKN CLS APL DERMABOND .7 (GAUZE/BANDAGES/DRESSINGS)
CANISTER SUCTION 2500CC (MISCELLANEOUS) ×3 IMPLANT
CHLORAPREP W/TINT 26ML (MISCELLANEOUS) ×1 IMPLANT
CLOTH BEACON ORANGE TIMEOUT ST (SAFETY) ×3 IMPLANT
CONT SPEC 4OZ CLIKSEAL STRL BL (MISCELLANEOUS) IMPLANT
COVER SURGICAL LIGHT HANDLE (MISCELLANEOUS) ×3 IMPLANT
DECANTER SPIKE VIAL GLASS SM (MISCELLANEOUS) ×1 IMPLANT
DERMABOND ADVANCED (GAUZE/BANDAGES/DRESSINGS)
DERMABOND ADVANCED .7 DNX12 (GAUZE/BANDAGES/DRESSINGS) ×1 IMPLANT
DRAPE PED LAPAROTOMY (DRAPES) ×3 IMPLANT
DRAPE UTILITY 15X26 W/TAPE STR (DRAPE) ×10 IMPLANT
ELECT CAUTERY BLADE 6.4 (BLADE) ×5 IMPLANT
ELECT REM PT RETURN 9FT ADLT (ELECTROSURGICAL) ×3
ELECTRODE REM PT RTRN 9FT ADLT (ELECTROSURGICAL) ×2 IMPLANT
GAUZE PACKING IODOFORM 1/4X5 (PACKING) ×2 IMPLANT
GLOVE BIO SURGEON STRL SZ8 (GLOVE) ×3 IMPLANT
GLOVE BIOGEL PI IND STRL 8 (GLOVE) ×2 IMPLANT
GLOVE BIOGEL PI INDICATOR 8 (GLOVE) ×1
GLOVE ECLIPSE 7.0 STRL STRAW (GLOVE) ×2 IMPLANT
GOWN PREVENTION PLUS XLARGE (GOWN DISPOSABLE) ×3 IMPLANT
GOWN STRL NON-REIN LRG LVL3 (GOWN DISPOSABLE) ×3 IMPLANT
KIT BASIN OR (CUSTOM PROCEDURE TRAY) ×3 IMPLANT
KIT ROOM TURNOVER OR (KITS) ×3 IMPLANT
NDL HYPO 25GX1X1/2 BEV (NEEDLE) ×1 IMPLANT
NEEDLE HYPO 25GX1X1/2 BEV (NEEDLE) IMPLANT
NS IRRIG 1000ML POUR BTL (IV SOLUTION) ×3 IMPLANT
PACK GENERAL/GYN (CUSTOM PROCEDURE TRAY) ×3 IMPLANT
PAD ARMBOARD 7.5X6 YLW CONV (MISCELLANEOUS) ×3 IMPLANT
SPONGE GAUZE 4X4 12PLY (GAUZE/BANDAGES/DRESSINGS) ×2 IMPLANT
SUT MNCRL AB 4-0 PS2 18 (SUTURE) ×3 IMPLANT
SUT VIC AB 3-0 SH 27 (SUTURE) ×3
SUT VIC AB 3-0 SH 27X BRD (SUTURE) ×2 IMPLANT
SYR CONTROL 10ML LL (SYRINGE) ×3 IMPLANT
TOWEL OR 17X24 6PK STRL BLUE (TOWEL DISPOSABLE) ×3 IMPLANT
TOWEL OR 17X26 10 PK STRL BLUE (TOWEL DISPOSABLE) ×3 IMPLANT

## 2012-09-28 NOTE — Op Note (Signed)
09/27/2012 - 09/28/2012  3:59 PM  PATIENT:  [name] [name]  78 y.o. female  PRE-OPERATIVE DIAGNOSIS:  bilateral axillary abcesses  POST-OPERATIVE DIAGNOSIS:  bilateral axillary abcesses  PROCEDURE:  Procedure(s): INCISION AND DRAINAGE BILATERAL AXILLARY ABSCESSES  SURGEON:  Surgeon(s): Liz Malady, MD  PHYSICIAN ASSISTANT:   ASSISTANTS: none   ANESTHESIA:   local and general  EBL:  Total I/O In: 636 [Blood:636] Out: -   BLOOD ADMINISTERED:completed Second unit of FFP  DRAINS: none   SPECIMEN:  No Specimen  DISPOSITION OF SPECIMEN:  N/A  COUNTS:  YES  DICTATION: .Dragon Dictation  Patient was admitted with bilateral axillary abscesses yesterday evening. She is on Coumadin. Her anticoagulation was reversed with FFP and vitamin K. She is brought for incision and drainage of bilateral axillary abscesses. Informed consent was obtained from her family. She received intravenous antibiotics. She is brought to the operating room. General anesthesia with laryngeal mask airway was administered by the anesthesia staff. Bilateral axilla were prepped and draped in a sterile fashion. Time out procedure was done. Quarter percent Marcaine with epinephrine was first injected around the abscesses on the left side. She had 4 areas in her left axilla. The largest area was draining. It material. This was sent for culture. The area was excised and loculations were broken up. Cautery was used to get good hemostasis. 3 other areas were incised and drained on the left side. Each one was cleaned out and cauterized for good hemostasis. Attention was directed to the right axilla. Local anesthetic was injected. 2 areas of abscess were present. One was excised and loculations underneath the broken up and cleaned out. The second area was  Incised. Both wounds were cleaned out and cauterized for good hemostasis. Iodoform was packed in the larger areas. Other areas were covered with gauze. All counts were  correct. Patient tolerated procedure well without apparent complication was taken recovery in stable condition.  PATIENT DISPOSITION:  PACU - hemodynamically stable.   Delay start of Pharmacological VTE agent (>24hrs) due to surgical blood loss or risk of bleeding:  no  Violeta Gelinas, MD, MPH, FACS Pager: [phone]  11/28/20133:59 PM

## 2012-09-28 NOTE — Progress Notes (Signed)
Patient to OR

## 2012-09-28 NOTE — Preoperative (Signed)
Beta Blockers   Reason not to administer Beta Blockers:Not Applicable 

## 2012-09-28 NOTE — Transfer of Care (Signed)
Immediate Anesthesia Transfer of Care Note  Patient: [name] [name]  Procedure(s) Performed: Procedure(s) (LRB) with comments: AXILLARY LYMPH NODE DISSECTION (Bilateral) - irrigation and debridement of bilateral axillary abcesses INCISION AND DRAINAGE ABSCESS (Bilateral)  Patient Location: PACU  Anesthesia Type:General  Level of Consciousness: awake and alert   Airway & Oxygen Therapy: Patient Spontanous Breathing and Patient connected to nasal cannula oxygen  Post-op Assessment: Report given to PACU RN, Post -op Vital signs reviewed and stable, Patient moving all extremities and Patient able to stick tongue midline  Post vital signs: Reviewed and stable  Complications: No apparent anesthesia complications

## 2012-09-28 NOTE — Progress Notes (Signed)
Pt asked for her at home,PO, med to take to help her sleep, nurse educated pt on meaning of NPO and possible surgery in the morning.Pt continues to ask. Will continue to monitor.

## 2012-09-28 NOTE — Anesthesia Postprocedure Evaluation (Signed)
  Anesthesia Post-op Note  Patient: [name] [name]  Procedure(s) Performed: Procedure(s) (LRB) with comments: INCISION AND DRAINAGE ABSCESS (Bilateral)  Patient Location: PACU  Anesthesia Type:General  Level of Consciousness: awake and alert   Airway and Oxygen Therapy: Patient Spontanous Breathing and Patient connected to nasal cannula oxygen  Post-op Pain: none  Post-op Assessment: Post-op Vital signs reviewed, Patient's Cardiovascular Status Stable, Respiratory Function Stable, Patent Airway, No signs of Nausea or vomiting and Pain level controlled  Post-op Vital Signs: Reviewed and stable  Complications: No apparent anesthesia complications

## 2012-09-28 NOTE — Progress Notes (Signed)
ANTIBIOTIC CONSULT NOTE - INITIAL  Pharmacy Consult for vancomycin Indication: axillary abscess  Allergies  Allergen Reactions  . Codeine Phosphate Nausea And Vomiting  . Darvocet (Propoxyphene-Acetaminophen) Nausea And Vomiting  . Promethazine Hcl Nausea And Vomiting  . Sulfamethoxazole Other (See Comments)    Pt doesn't remember reaction  . Amoxicillin Nausea And Vomiting and Rash  . Penicillins Nausea And Vomiting and Rash    Patient Measurements: Height: 5\' 2"  (157.5 cm) Weight: 153 lb 3.2 oz (69.491 kg) IBW/kg (Calculated) : 50.1    Vital Signs: Temp: 98 F (36.7 C) (11/28 1200) Temp src: Oral (11/28 1200) BP: 158/62 mmHg (11/28 1200) Pulse Rate: 63  (11/28 1200) Intake/Output from previous day:   Intake/Output from this shift: Total I/O In: 12.5 [Blood:12.5] Out: -   Labs:  [facility] 09/27/12 1759  WBC 8.9  HGB 12.1  PLT 245  LABCREA --  CREATININE 0.79   Estimated Creatinine Clearance: 43.6 ml/min (by C-G formula based on Cr of 0.79). No results found for this basename: VANCOTROUGH:2,VANCOPEAK:2,VANCORANDOM:2,GENTTROUGH:2,GENTPEAK:2,GENTRANDOM:2,TOBRATROUGH:2,TOBRAPEAK:2,TOBRARND:2,AMIKACINPEAK:2,AMIKACINTROU:2,AMIKACIN:2, in the last 72 hours   Microbiology: Recent Results (from the past 720 hour(s))  MRSA PCR SCREENING     Status: Abnormal   Collection Time   09/27/12  7:42 PM      Component Value Range Status Comment   MRSA by PCR POSITIVE (*) NEGATIVE Final   SURGICAL PCR SCREEN     Status: Abnormal   Collection Time   09/28/12  3:09 AM      Component Value Range Status Comment   MRSA, PCR POSITIVE (*) NEGATIVE Final    Staphylococcus aureus POSITIVE (*) NEGATIVE Final     Medical History: Past Medical History  Diagnosis Date  . CAD (coronary artery disease)     S/p PTCA / stenting (last cath 2004, multiple LAD stents, 2 stents in the right coronary artery all patent)  . 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 Started coumadin    . Breast CA 2005-2006    S/p lumpectomy and radiation  . HTN (hypertension)   . Restless leg syndrome   . PMR (polymyalgia rheumatica)   . Axillary abscess   . Anxiety   . Dementia    Assessment: 78 year old female with axillary abscess, started on cipro this morning, orders to add vancomycin. She does have excellent renal function for her age but will need to dose adjust. No fevers noted and wbc is normal at 8.9.  Goal of Therapy:  Vancomycin trough level 10-15 mcg/ml  Plan:  Measure antibiotic drug levels at steady state Follow up culture results Vancomycin 1000mg  IV q24  Severiano Gilbert 09/28/2012,12:58 PM

## 2012-09-28 NOTE — Progress Notes (Signed)
Patient back from OR.

## 2012-09-28 NOTE — Anesthesia Preprocedure Evaluation (Signed)
Anesthesia Evaluation  Patient identified by MRN, date of birth, ID band Patient awake    Reviewed: Allergy & Precautions, H&P , NPO status , Patient's Chart, lab work & pertinent test results  Airway Mallampati: II  Neck ROM: full    Dental   Pulmonary shortness of breath,          Cardiovascular hypertension, + angina + CAD and + Cardiac Stents     Neuro/Psych Anxiety    GI/Hepatic   Endo/Other    Renal/GU      Musculoskeletal   Abdominal   Peds  Hematology   Anesthesia Other Findings   Reproductive/Obstetrics                           Anesthesia Physical Anesthesia Plan  ASA: III  Anesthesia Plan: General   Post-op Pain Management:    Induction: Intravenous  Airway Management Planned: LMA  Additional Equipment:   Intra-op Plan:   Post-operative Plan:   Informed Consent: I have reviewed the patients History and Physical, chart, labs and discussed the procedure including the risks, benefits and alternatives for the proposed anesthesia with the patient or authorized representative who has indicated his/her understanding and acceptance.     Plan Discussed with: CRNA and Surgeon  Anesthesia Plan Comments:         Anesthesia Quick Evaluation

## 2012-09-28 NOTE — Progress Notes (Signed)
  Subjective: thirsty  Objective: Vital signs in last 24 hours: Temp:  [97 F (36.1 C)-98.3 F (36.8 C)] 98.3 F (36.8 C) (11/28 0641) Pulse Rate:  [57-70] 57  (11/28 0641) Resp:  [18] 18  (11/28 0641) BP: (130-153)/(59-84) 136/59 mmHg (11/28 0641) SpO2:  [95 %-97 %] 95 % (11/28 0641) Weight:  [69.491 kg (153 lb 3.2 oz)] 69.491 kg (153 lb 3.2 oz) (11/27 1700) Last BM Date: 09/26/12  Intake/Output from previous day:   Intake/Output this shift:    B axillary abscess L>R  Lab Results:   [facility] 09/27/12 1759  WBC 8.9  HGB 12.1  HCT 38.0  PLT 245   BMET  Basename 09/27/12 1759  NA 135  K 4.3  CL 101  CO2 22  GLUCOSE 100*  BUN 17  CREATININE 0.79  CALCIUM 9.6   PT/INR  Basename 09/28/12 0700 09/27/12 1759  LABPROT 26.3* 25.3*  INR 2.56* 2.43*   ABG No results found for this basename: PHART:2,PCO2:2,PO2:2,HCO3:2 in the last 72 hours  Studies/Results: No results found.  Anti-infectives: Anti-infectives     Start     Dose/Rate Route Frequency Ordered Stop   09/27/12 1800   ciprofloxacin (CIPRO) IVPB 400 mg        400 mg 200 mL/hr over 60 Minutes Intravenous Every 12 hours 09/27/12 1700            Assessment/Plan: s/p * No surgery found * B axillary abscess and anticoagulated - FFP and vitamin K, then will take to OR today for I&D - consent obtained from family  LOS: 1 day    Jearld Hemp E 09/28/2012

## 2012-09-29 LAB — URINALYSIS, ROUTINE W REFLEX MICROSCOPIC
Bilirubin Urine: NEGATIVE
Hgb urine dipstick: NEGATIVE
Ketones, ur: NEGATIVE mg/dL
Nitrite: NEGATIVE
Protein, ur: NEGATIVE mg/dL
Urobilinogen, UA: 0.2 mg/dL (ref 0.0–1.0)

## 2012-09-29 LAB — BASIC METABOLIC PANEL
Chloride: 95 mEq/L — ABNORMAL LOW (ref 96–112)
Creatinine, Ser: 0.85 mg/dL (ref 0.50–1.10)
GFR calc Af Amer: 68 mL/min — ABNORMAL LOW (ref >90)
Potassium: 4 mEq/L (ref 3.5–5.1)
Sodium: 131 mEq/L — ABNORMAL LOW (ref 135–145)

## 2012-09-29 LAB — PREPARE FRESH FROZEN PLASMA: Unit division: 0

## 2012-09-29 LAB — MAGNESIUM: Magnesium: 1.7 mg/dL (ref 1.5–2.5)

## 2012-09-29 MED ORDER — TRAMADOL HCL 50 MG PO TABS
25.0000 mg | ORAL_TABLET | Freq: Four times a day (QID) | ORAL | Status: DC | PRN
  Administered 2012-10-01: 50 mg via ORAL
  Filled 2012-09-29 (×2): qty 1

## 2012-09-29 MED ORDER — WARFARIN SODIUM 4 MG PO TABS
4.0000 mg | ORAL_TABLET | Freq: Once | ORAL | Status: AC
  Administered 2012-09-29: 4 mg via ORAL
  Filled 2012-09-29: qty 1

## 2012-09-29 MED ORDER — WARFARIN - PHARMACIST DOSING INPATIENT
Freq: Every day | Status: DC
Start: 2012-09-29 — End: 2012-10-01

## 2012-09-29 NOTE — Progress Notes (Signed)
Reena Borromeo M. Reghan Thul, MD, FACS General, Bariatric, & Minimally Invasive Surgery [facility]  

## 2012-09-29 NOTE — Progress Notes (Signed)
ANTICOAGULATION CONSULT NOTE - Initial Consult  Pharmacy Consult for warfarin Indication: History of DVT/PE  Allergies  Allergen Reactions  . Codeine Phosphate Nausea And Vomiting  . Darvocet (Propoxyphene-Acetaminophen) Nausea And Vomiting  . Promethazine Hcl Nausea And Vomiting  . Sulfamethoxazole Other (See Comments)    Pt doesn't remember reaction  . Amoxicillin Nausea And Vomiting and Rash  . Penicillins Nausea And Vomiting and Rash    Patient Measurements: Height: 5\' 2"  (157.5 cm) Weight: 153 lb 3.2 oz (69.491 kg) IBW/kg (Calculated) : 50.1    Vital Signs: Temp: 98.7 F (37.1 C) (11/29 0422) Temp src: Oral (11/29 0422) BP: 148/61 mmHg (11/29 0631) Pulse Rate: 54  (11/29 0900)  Labs:  Basename 09/28/12 0700 09/27/12 1759  HGB -- 12.1  HCT -- 38.0  PLT -- 245  APTT -- --  LABPROT 26.3* 25.3*  INR 2.56* 2.43*  HEPARINUNFRC -- --  CREATININE -- 0.79  CKTOTAL -- --  CKMB -- --  TROPONINI -- --    Estimated Creatinine Clearance: 43.6 ml/min (by C-G formula based on Cr of 0.79).   Medical History: Past Medical History  Diagnosis Date  . CAD (coronary artery disease)     S/p PTCA / stenting (last cath 2004, multiple LAD stents, 2 stents in the right coronary artery all patent)  . 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 Started coumadin  . Breast CA 2005-2006    S/p lumpectomy and radiation  . HTN (hypertension)   . Restless leg syndrome   . PMR (polymyalgia rheumatica)   . Axillary abscess   . Anxiety   . Dementia     Medications:  Warfarin 2.5mg  daily pta  Assessment: 78 year old female s/p I&D of bilateral axillary abscesses. 10mg  IV vitamin k given yesterday for INR reversal prior to procedure. No post-op bleeding complications noted. No INR draw this am but more than likely is low given vitamin k administration. Will give slightly larger than home dose tonight and check daily INR starting Saturday.  Goal of Therapy:  INR  2-3 Monitor platelets by anticoagulation protocol: Yes Vancomycin trough 10-20   Plan:  Warfarin 4mg  tonight Daily INR Will continue vancomycin 1g q24 hours and cipro 400mg  bid Follow up change to po abx when appropriate  Severiano Gilbert 09/29/2012,10:46 AM

## 2012-09-29 NOTE — Progress Notes (Signed)
MD, patient is having a lot of urinary frequency and urgency.  There are no orders for U/A or culture.  Please address.

## 2012-09-29 NOTE — Progress Notes (Signed)
1 Day Post-Op  Subjective: Reports some nausea after getting pain pill  Objective: Vital signs in last 24 hours: Temp:  [97.3 F (36.3 C)-99.6 F (37.6 C)] 98.7 F (37.1 C) (11/29 0422) Pulse Rate:  [54-106] 54  (11/29 0900) Resp:  [9-20] 18  (11/29 0422) BP: (148-175)/(47-80) 148/61 mmHg (11/29 0631) SpO2:  [92 %-100 %] 96 % (11/29 0900) Last BM Date: 09/28/12  Intake/Output from previous day: 11/28 0701 - 11/29 0700 In: 1796 [P.O.:240; I.V.:600; Blood:956] Out: 710 [Urine:710] Intake/Output this shift:    Alert, ox3 (nov, 2013) cta  Reg B/l axilla - dressing intact  Lab Results:   [facility] 09/27/12 1759  WBC 8.9  HGB 12.1  HCT 38.0  PLT 245   BMET  Basename 09/27/12 1759  NA 135  K 4.3  CL 101  CO2 22  GLUCOSE 100*  BUN 17  CREATININE 0.79  CALCIUM 9.6   PT/INR  Basename 09/28/12 0700 09/27/12 1759  LABPROT 26.3* 25.3*  INR 2.56* 2.43*   ABG No results found for this basename: PHART:2,PCO2:2,PO2:2,HCO3:2 in the last 72 hours  Studies/Results: No results found.  Anti-infectives: Anti-infectives     Start     Dose/Rate Route Frequency Ordered Stop   09/28/12 1600   vancomycin (VANCOCIN) IVPB 1000 mg/200 mL premix        1,000 mg 200 mL/hr over 60 Minutes Intravenous Every 24 hours 09/28/12 1304     09/27/12 1800   ciprofloxacin (CIPRO) IVPB 400 mg        400 mg 200 mL/hr over 60 Minutes Intravenous Every 12 hours 09/27/12 1700            Assessment/Plan: s/p Procedure(s) (LRB) with comments: INCISION AND DRAINAGE ABSCESS (Bilateral) Restart coumadin Cont iv abx F/u cultures OOB as tolerated  Mary Sella. Andrey Campanile, MD, FACS General, Bariatric, & Minimally Invasive Surgery [facility], [place]   LOS: 2 days    Atilano Ina 09/29/2012

## 2012-09-29 NOTE — Progress Notes (Signed)
1 Day Post-Op  Subjective: Patient feeling okay, felt nauseated and SOB from pain pill, pt requests to not take any narcotics.  Pt eating well.  Pt c/o of frequent urination to there RN overnight.    Objective: Vital signs in last 24 hours: Temp:  [97.3 F (36.3 C)-99.6 F (37.6 C)] 98.7 F (37.1 C) (11/29 0422) Pulse Rate:  [54-106] 54  (11/29 0900) Resp:  [9-20] 18  (11/29 0422) BP: (148-175)/(47-80) 148/61 mmHg (11/29 0631) SpO2:  [92 %-100 %] 96 % (11/29 0900) Last BM Date: 09/28/12  Intake/Output from previous day: 11/28 0701 - 11/29 0700 In: 1796 [P.O.:240; I.V.:600; Blood:956] Out: 710 [Urine:710] Intake/Output this shift:    PE: Gen:  Alert, NAD, pleasant B/L axilla: some gray necrotic tissue debrided at bedside, tissue pink, minimal bleeding, surrounding tissue still quite indurated and erythematous  Lab Results:   [facility] 09/27/12 1759  WBC 8.9  HGB 12.1  HCT 38.0  PLT 245   BMET  Basename 09/27/12 1759  NA 135  K 4.3  CL 101  CO2 22  GLUCOSE 100*  BUN 17  CREATININE 0.79  CALCIUM 9.6   PT/INR  Basename 09/28/12 0700 09/27/12 1759  LABPROT 26.3* 25.3*  INR 2.56* 2.43*   CMP     Component Value Date/Time   NA 135 09/27/2012 1759   K 4.3 09/27/2012 1759   CL 101 09/27/2012 1759   CO2 22 09/27/2012 1759   GLUCOSE 100* 09/27/2012 1759   BUN 17 09/27/2012 1759   CREATININE 0.79 09/27/2012 1759   CALCIUM 9.6 09/27/2012 1759   PROT 6.6 08/24/2012 1545   ALBUMIN 3.3* 08/24/2012 1545   AST 14 08/24/2012 1545   ALT 12 08/24/2012 1545   ALKPHOS 54 08/24/2012 1545   BILITOT 0.2* 08/24/2012 1545   GFRNONAA 72* 09/27/2012 1759   GFRAA 83* 09/27/2012 1759   Lipase     Component Value Date/Time   LIPASE 26 07/11/2011 0741       Studies/Results: No results found.  Anti-infectives: Anti-infectives     Start     Dose/Rate Route Frequency Ordered Stop   09/28/12 1600   vancomycin (VANCOCIN) IVPB 1000 mg/200 mL premix        1,000 mg 200  mL/hr over 60 Minutes Intravenous Every 24 hours 09/28/12 1304     09/27/12 1800   ciprofloxacin (CIPRO) IVPB 400 mg        400 mg 200 mL/hr over 60 Minutes Intravenous Every 12 hours 09/27/12 1700             Assessment/Plan 78 y/o female POD #1 s/p INCISION AND DRAINAGE BILATERAL AXILLARY ABSCESSES 1.  Cont pain control with tylenol and ultram, pt gets SOB with narcotics 2.  BP has been up yesterday, already restarted home meds, may be due to pain, may need medicine consult if not improved 3.  Urinary frequency-will get UA/culture 4.  Cont antibiotics vanc and cipro-MRSA positive 5.  Ambulate OOB and IS 6.  VTE-INR 2.56 coumadin restarted per pharmacy      LOS: 2 days    DORT, [facility] 09/29/2012, 10:27 AM Pager: [phone]

## 2012-09-30 LAB — URINE CULTURE: Culture: NO GROWTH

## 2012-09-30 LAB — BASIC METABOLIC PANEL
BUN: 13 mg/dL (ref 6–23)
Calcium: 9.1 mg/dL (ref 8.4–10.5)
GFR calc Af Amer: 65 mL/min — ABNORMAL LOW (ref >90)
GFR calc non Af Amer: 56 mL/min — ABNORMAL LOW (ref >90)
Glucose, Bld: 112 mg/dL — ABNORMAL HIGH (ref 70–99)
Sodium: 131 mEq/L — ABNORMAL LOW (ref 135–145)

## 2012-09-30 LAB — PROTIME-INR: Prothrombin Time: 13.5 seconds (ref 11.6–15.2)

## 2012-09-30 MED ORDER — WARFARIN SODIUM 4 MG PO TABS
4.0000 mg | ORAL_TABLET | Freq: Once | ORAL | Status: AC
  Administered 2012-09-30: 4 mg via ORAL
  Filled 2012-09-30: qty 1

## 2012-09-30 NOTE — Progress Notes (Signed)
2 Days Post-Op  Subjective: Pt feeling great today.  Pt notes she wasn't feeling well yesterday, but is much better today.  Pt denies any SOB, CP, abdominal pain.  Pain is well controlled in her b/l axilla's.  Pt is tolerating her diet and urinating regularly.  Pt had a BM a couple of days ago, but not recently.    Objective: Vital signs in last 24 hours: Temp:  [97.7 F (36.5 C)-98.6 F (37 C)] 98.5 F (36.9 C) (11/30 1100) Pulse Rate:  [50-60] 60  (11/30 1100) Resp:  [18-19] 18  (11/30 1100) BP: (143-166)/(63-70) 143/65 mmHg (11/30 1100) SpO2:  [93 %-96 %] 95 % (11/30 1100) Weight:  [155 lb 10.3 oz (70.6 kg)] 155 lb 10.3 oz (70.6 kg) (11/30 1100) Last BM Date: 09/28/12  Intake/Output from previous day: 11/29 0701 - 11/30 0700 In: 1600 [IV Piggyback:1600] Out: 850 [Urine:850] Intake/Output this shift: Total I/O In: -  Out: 700 [Urine:700]  PE: Gen:  Alert, NAD, pleasant B/L axilla:  some gray necrotic tissue debrided at bedside, tissue pink, minimal bleeding, surrounding tissue much less indurated and erythematous, no purulent drainage   Lab Results:   [facility] 09/27/12 1759  WBC 8.9  HGB 12.1  HCT 38.0  PLT 245   BMET  Basename 09/29/12 1325 09/27/12 1759  NA 131* 135  K 4.0 4.3  CL 95* 101  CO2 27 22  GLUCOSE 168* 100*  BUN 13 17  CREATININE 0.85 0.79  CALCIUM 9.4 9.6   PT/INR  Basename 09/30/12 0635 09/28/12 0700  LABPROT 13.5 26.3*  INR 1.04 2.56*   CMP     Component Value Date/Time   NA 131* 09/29/2012 1325   K 4.0 09/29/2012 1325   CL 95* 09/29/2012 1325   CO2 27 09/29/2012 1325   GLUCOSE 168* 09/29/2012 1325   BUN 13 09/29/2012 1325   CREATININE 0.85 09/29/2012 1325   CALCIUM 9.4 09/29/2012 1325   PROT 6.6 08/24/2012 1545   ALBUMIN 3.3* 08/24/2012 1545   AST 14 08/24/2012 1545   ALT 12 08/24/2012 1545   ALKPHOS 54 08/24/2012 1545   BILITOT 0.2* 08/24/2012 1545   GFRNONAA 59* 09/29/2012 1325   GFRAA 68* 09/29/2012 1325   Lipase       Component Value Date/Time   LIPASE 26 07/11/2011 0741       Studies/Results: No results found.  Anti-infectives: Anti-infectives     Start     Dose/Rate Route Frequency Ordered Stop   09/28/12 1600   vancomycin (VANCOCIN) IVPB 1000 mg/200 mL premix        1,000 mg 200 mL/hr over 60 Minutes Intravenous Every 24 hours 09/28/12 1304     09/27/12 1800   ciprofloxacin (CIPRO) IVPB 400 mg        400 mg 200 mL/hr over 60 Minutes Intravenous Every 12 hours 09/27/12 1700             Assessment/Plan 78 y/o female POD #1 s/p INCISION AND DRAINAGE BILATERAL AXILLARY ABSCESSES  1. Cont pain control with tylenol and ultram 2. BP and pain improved today, will continue to monitor, low threshold for cardiology consult 3. Urinary frequency-UA normal, pending culture 4. Cont antibiotics vanc and cipro-MRSA positive  5. Ambulate OOB and IS-will order PT/OT given her problems with mobility even at home, may need placement prior to going home. 6.  Hyponatremia-asymptomatic, will reorder for today, not on IVF, will give salt tabs if not improved  Discussed care with son over the  phone    LOS: 3 days    DORT, Elenie Coven 09/30/2012, 11:15 AM Pager: [phone]

## 2012-09-30 NOTE — Progress Notes (Signed)
Feeling much better today Will see how she does with PT Home with son vs SNF Patient examined and I agree with the assessment and plan  Violeta Gelinas, MD, MPH, FACS Pager: [phone]  09/30/2012 2:24 PM

## 2012-09-30 NOTE — Progress Notes (Signed)
ANTICOAGULATION CONSULT NOTE - Follow Up Consult  Pharmacy Consult for coumadin Indication: History of DVT/PE  Allergies  Allergen Reactions  . Codeine Phosphate Nausea And Vomiting  . Darvocet (Propoxyphene-Acetaminophen) Nausea And Vomiting  . Promethazine Hcl Nausea And Vomiting  . Sulfamethoxazole Other (See Comments)    Pt doesn't remember reaction  . Amoxicillin Nausea And Vomiting and Rash  . Penicillins Nausea And Vomiting and Rash    Patient Measurements: Height: 5\' 2"  (157.5 cm) Weight: 153 lb 3.2 oz (69.491 kg) IBW/kg (Calculated) : 50.1    Vital Signs: Temp: 98.2 F (36.8 C) (11/30 0600) BP: 144/66 mmHg (11/30 0600) Pulse Rate: 55  (11/30 0600)  Labs:  Basename 09/30/12 0635 09/29/12 1325 09/28/12 0700 09/27/12 1759  HGB -- -- -- 12.1  HCT -- -- -- 38.0  PLT -- -- -- 245  APTT -- -- -- --  LABPROT 13.5 -- 26.3* 25.3*  INR 1.04 -- 2.56* 2.43*  HEPARINUNFRC -- -- -- --  CREATININE -- 0.85 -- 0.79  CKTOTAL -- -- -- --  CKMB -- -- -- --  TROPONINI -- -- -- --    Estimated Creatinine Clearance: 41 ml/min (by C-G formula based on Cr of 0.85).   Medications:  Scheduled:    . amLODipine  10 mg Oral Daily  . atorvastatin  10 mg Oral Daily  . Chlorhexidine Gluconate Cloth  6 each Topical Q0600  . ciprofloxacin  400 mg Intravenous Q12H  . donepezil  10 mg Oral q morning - 10a  . losartan  50 mg Oral Daily  . mirabegron ER  25 mg Oral Daily  . mupirocin ointment  1 application Nasal BID  . potassium chloride SA  20 mEq Oral Daily  . predniSONE  2 mg Oral Daily  . rOPINIRole  0.5 mg Oral BID  . vancomycin  1,000 mg Intravenous Q24H  . [COMPLETED] warfarin  4 mg Oral ONCE-1800  . Warfarin - Pharmacist Dosing Inpatient   Does not apply q1800    Assessment: 78 year old female s/p I&D 11/28 of bilateral axillary abscesses. 10mg  IV vitamin k and 2 U FFP given 11/28 for INR reversal prior to procedure. Coumadin restarted 11/29,  INR this AM is 1.04. Will  give slightly larger than home dose again tonight due to vitamin K administration on 11/28. PTA coumadin dose was 2.5 mg daily. INR on admit was therapeutic at 2.43.  Goal of Therapy:  INR 2-3 Monitor platelets by anticoagulation protocol: Yes   Plan:  1. Coumadin 4 mg x1 2. F/U INR in AM 3. Monitor for s/sx of bleeding  Bola A. Wandra Feinstein D Clinical Pharmacist Pager:[phone] Phone [phone] 09/30/2012 9:57 AM

## 2012-10-01 LAB — CULTURE, ROUTINE-ABSCESS: Gram Stain: NONE SEEN

## 2012-10-01 MED ORDER — TRAMADOL HCL 50 MG PO TABS: 25.0000 mg | ORAL_TABLET | Freq: Three times a day (TID) | ORAL | Status: DC | PRN

## 2012-10-01 MED ORDER — SODIUM CHLORIDE 0.9 % IV SOLN
750.0000 mg | INTRAVENOUS | Status: DC
  Filled 2012-10-01: qty 750

## 2012-10-01 MED ORDER — CLINDAMYCIN HCL 300 MG PO CAPS
300.0000 mg | ORAL_CAPSULE | Freq: Three times a day (TID) | ORAL | Status: DC
  Administered 2012-10-01: 300 mg via ORAL
  Filled 2012-10-01 (×3): qty 1

## 2012-10-01 MED ORDER — CLINDAMYCIN HCL 300 MG PO CAPS: 300.0000 mg | ORAL_CAPSULE | Freq: Three times a day (TID) | ORAL | Status: DC

## 2012-10-01 MED ORDER — WARFARIN SODIUM 4 MG PO TABS
4.0000 mg | ORAL_TABLET | Freq: Once | ORAL | Status: DC
  Filled 2012-10-01: qty 1

## 2012-10-01 NOTE — Progress Notes (Signed)
Physical Therapy Evaluation Patient Details Name: [name] [name] MRN: [id number] DOB: [birth year] Today's Date: 10/01/2012 Time: 4098-1191 PT Time Calculation (min): 28 min  PT Assessment / Plan / Recommendation Clinical Impression  78 yo female admitted with bil axillary abscesses (+MRSA), now s/p I&Ds bilaterally; Presents overall moving well with use of RW, and cues for technique; Will benefit from likely one more PT visit for stairs if pt remains in hospital    PT Assessment  Patient needs continued PT services    Follow Up Recommendations  Home health PT;Other (comment);Supervision/Assistance - 24 hour (HHRN to monitor wounds/healing)    Does the patient have the potential to tolerate intense rehabilitation      Barriers to Discharge None      Equipment Recommendations  None recommended by PT    Recommendations for Other Services OT consult   Frequency Min 3X/week    Precautions / Restrictions Precautions Precautions: Fall Precaution Comments: Fall risk is minimized when pt uses RW Restrictions Weight Bearing Restrictions: No   Pertinent Vitals/Pain 3-4/10 Left axilla; Reports is sore; RN notified      Mobility  Bed Mobility Bed Mobility: Supine to Sit Supine to Sit: 4: Min guard;With rails (HOB slightly elevated) Details for Bed Mobility Assistance: Pretty smooth transition; No physical assist required Transfers Transfers: Sit to Stand;Stand to Sit Sit to Stand: 4: Min guard;With upper extremity assist;From bed Stand to Sit: 4: Min guard;To chair/3-in-1;With armrests Details for Transfer Assistance: Noted initial difficulty with sit to stand, but with cues to scoot forward and push from seated surface, pt was able to stand without physical assist Ambulation/Gait Ambulation/Gait Assistance: 4: Min guard (without physical contact) Ambulation Distance (Feet): 200 Feet Assistive device: Rolling walker Ambulation/Gait Assistance Details: Cues for RW management and  proximity; Otherwise, able to ambulate household distances well, without noted loss of balance Gait Pattern: Step-through pattern    Shoulder Instructions     Exercises     PT Diagnosis: Generalized weakness;Acute pain  PT Problem List: Decreased strength;Decreased range of motion;Decreased balance;Decreased knowledge of use of DME;Pain PT Treatment Interventions: DME instruction;Gait training;Stair training;Functional mobility training;Therapeutic activities;Therapeutic exercise;Patient/family education   PT Goals Acute Rehab PT Goals PT Goal Formulation: With patient Time For Goal Achievement: 10/15/12 Potential to Achieve Goals: Good Pt will go Supine/Side to Sit: with modified independence PT Goal: Supine/Side to Sit - Progress: Goal set today Pt will go Sit to Supine/Side: with modified independence PT Goal: Sit to Supine/Side - Progress: Goal set today Pt will go Sit to Stand: with modified independence PT Goal: Sit to Stand - Progress: Goal set today Pt will go Stand to Sit: with modified independence PT Goal: Stand to Sit - Progress: Goal set today Pt will Ambulate: >150 feet;with modified independence;with rolling walker PT Goal: Ambulate - Progress: Goal set today Pt will Go Up / Down Stairs: 1-2 stairs;with modified independence;with rolling walker;with least restrictive assistive device PT Goal: Up/Down Stairs - Progress: Goal set today  Visit Information  Last PT Received On: 10/01/12 Assistance Needed: +1    Subjective Data  Subjective: Agreeable to OOB; Would really like to go home Patient Stated Goal: Home   Prior Functioning  Home Living Lives With: Son Available Help at Discharge: Family;Available 24 hours/day Type of Home: House Home Access: Stairs to enter [employer] of Steps: 1 (back door) Entrance Stairs-Rails: None Home Layout: One level Bathroom Shower/Tub: Nurse, adult Accessibility: Yes Home Adaptive Equipment: Walker -  rolling;Shower chair with back;Hand-held shower  hose Prior Function Level of Independence: Independent with assistive device(s) (household) Able to Take Stairs?: Yes Driving: No Comments: reports she and her son help each other Communication Communication: No difficulties;HOH (occasionally having to repeat questions) Dominant Hand: Right    Cognition  Overall Cognitive Status: Appears within functional limits for tasks assessed/performed Arousal/Alertness: Awake/alert Orientation Level: Appears intact for tasks assessed Behavior During Session: [facility] for tasks performed    Extremity/Trunk Assessment Right Upper Extremity Assessment RUE ROM/Strength/Tone: [facility] for tasks assessed (Able to reach to back of her head for combing) Left Upper Extremity Assessment LUE ROM/Strength/Tone: WFL for tasks assessed (Able to reach back of head for combing, but more sore) Right Lower Extremity Assessment RLE ROM/Strength/Tone: Deficits RLE ROM/Strength/Tone Deficits: somewhat generally weak, with dependence and UE support for sit to/from stand Left Lower Extremity Assessment LLE ROM/Strength/Tone: Deficits LLE ROM/Strength/Tone Deficits: somewhat generally weak, with dependence and UE support for sit to/from stand Trunk Assessment Trunk Assessment: Normal   Balance    End of Session PT - End of Session Activity Tolerance: Patient tolerated treatment well Patient left: in chair;with call bell/phone within reach Nurse Communication: Mobility status  GP     Van Clines [facility] [place], [place] [phone]  10/01/2012, 9:50 AM

## 2012-10-01 NOTE — Discharge Summary (Signed)
Physician Discharge Summary  Patient ID: MAHATHI POKORNEY MRN: [id number] DOB/AGE: May 06, 1921 78 y.o.  Admit date: 09/27/2012 Discharge date: 10/01/2012  Admitting Diagnosis: B/L multiple axillary abscesses  Discharge Diagnosis Patient Active Problem List   Diagnosis Date Noted  . Axillary abscess - bilateral, multiple 09/27/2012  . Hypotension 08/24/2012  . Near syncope 08/24/2012  . Abscess of axilla, left 06/27/2012  . Bradycardia 06/13/2012  . Abscess 06/11/2012  . Chest pain 06/09/2012  . Cellulitis 06/09/2012  . Weakness of both legs 06/09/2012  . PMR (polymyalgia rheumatica)   . Restless leg syndrome   . Angina pectoris, unstable 03/02/2012  . Anemia 03/02/2012  . Dyspnea 02/03/2012  . UTI (urinary tract infection) 02/03/2012  . CAD (coronary artery disease)   . HTN (hypertension)   . VTE (venous thromboembolism) 10/01/2010  . HYPERCHOLESTEROLEMIA  IIA 08/27/2009  . HYPERLIPIDEMIA-MIXED 12/11/2008  . HYPERTENSION, BENIGN 12/11/2008    Consultants None  Imaging: No results found.  Procedures Dr. Laurell Josephs Thompson-INCISION AND DRAINAGE BILATERAL AXILLARY ABSCESSES   Hospital Course:  78 y/o female who presented as a direct admission to [facility] by Dr. Corliss Skains.  Workup showed Leukocytosis and b/l axillary abscesses.  The patient had to receive FFP and Vit K in order to reverse her anticoagulation.  Patient was admitted and underwent procedure listed above.  Tolerated procedure well and was transferred to the floor.  She did experience some bradycardia and hypertension on POD #1 which resolved on POD #2.  She was restarted on her coumadin.  Diet was advanced as tolerated.  On POD #3, the patient was voiding well, tolerating diet, ambulating well, pain well controlled, vital signs stable, incisions/packing c/d/i and felt stable for discharge home with home health care for PT and wound care.  Patient will follow up in our office ([facility] clinic) in 2-3 weeks  and knows to call with  questions or concerns.  Physical Exam: General:  Alert, NAD, pleasant, comfortable B/L axilla:  tissue pink, minimal bleeding, surrounding tissue much less indurated and erythematous, no purulent drainage     Medication List     As of 10/01/2012 12:53 PM    TAKE these medications         acetaminophen 500 MG tablet   Commonly known as: TYLENOL   Take 500 mg by mouth every 6 (six) hours as needed. For pain      amLODipine 10 MG tablet   Commonly known as: NORVASC   Take 10 mg by mouth daily.      atorvastatin 20 MG tablet   Commonly known as: LIPITOR   Take 10 mg by mouth daily.      benzonatate 100 MG capsule   Commonly known as: TESSALON   Take 100 mg by mouth 3 (three) times daily as needed. For cough      cholecalciferol 1000 UNITS tablet   Commonly known as: VITAMIN D   Take 2,000 Units by mouth daily.      cholestyramine light 4 G packet   Commonly known as: PREVALITE   Take 4 g by mouth daily as needed. For loose stools      clindamycin 300 MG capsule   Commonly known as: CLEOCIN   Take 1 capsule (300 mg total) by mouth 3 (three) times daily.      donepezil 10 MG tablet   Commonly known as: ARICEPT   Take 10 mg by mouth every morning.      ferrous sulfate 325 (65 FE) MG EC tablet  Take 325 mg by mouth daily with breakfast.      LORazepam 0.5 MG tablet   Commonly known as: ATIVAN   Take 0.5 tablets (0.25 mg total) by mouth 3 (three) times daily as needed for anxiety. for anxiety      losartan 50 MG tablet   Commonly known as: COZAAR   Take 50 mg by mouth daily.      multivitamin with minerals Tabs   Take 1 tablet by mouth daily.      MYRBETRIQ 25 MG Tb24   Generic drug: mirabegron ER   Take 25 mg by mouth daily.      NEXIUM 40 MG capsule   Generic drug: esomeprazole   Take 40 mg by mouth daily before breakfast.      nitroGLYCERIN 0.4 MG SL tablet   Commonly known as: NITROSTAT   Place 0.4 mg under the tongue every 5 (five) minutes x 3 doses  as needed. For chest pain      potassium chloride SA 20 MEQ tablet   Commonly known as: K-DUR,KLOR-CON   Take 20 mEq by mouth daily.      predniSONE 1 MG tablet   Commonly known as: DELTASONE   Take 2 mg by mouth daily.      rOPINIRole 0.5 MG tablet   Commonly known as: REQUIP   Take 1 tablet (0.5 mg total) by mouth 2 (two) times daily. Late afternoon and 3 hours before bed time      traMADol 50 MG tablet   Commonly known as: ULTRAM   Take 25 mg by mouth every 6 (six) hours as needed. For pain      traMADol 50 MG tablet   Commonly known as: ULTRAM   Take 0.5-1 tablets (25-50 mg total) by mouth every 8 (eight) hours as needed (Pain).      warfarin 2.5 MG tablet   Commonly known as: COUMADIN   Take 2.5 mg by mouth every evening.      zaleplon 10 MG capsule   Commonly known as: SONATA   Take 1 capsule (10 mg total) by mouth at bedtime. Facility MD to refill          Follow-up Information    Follow up with [facility]. Schedule an appointment as soon as possible for a visit in 2 months. (call to ask for an appt in 2-3 weeks)    Contact information:   [address]   [place] [address] [phone]       Follow up with [name], MD. (follow up with your primary care regarding your hypertension)    Contact information:   [address] [place] [address] [phone]       Follow up with [facility]. [facility] RN dressings changes)    Contact information:   [phone]         Signed: [facility] [phone]  10/01/2012, 11:05 AM

## 2012-10-01 NOTE — Progress Notes (Signed)
10/01/12 patient to go home today. IV sites removed. Discharge instructions reviewed with patient. Case Manager setting up HomeCare RN and OT.

## 2012-10-01 NOTE — Discharge Summary (Signed)
Ready for discharge

## 2012-10-01 NOTE — Progress Notes (Signed)
   CARE MANAGEMENT NOTE 10/01/2012  Patient:  EVIANA, SIBILIA   Account Number:  [id number]  Date Initiated:  10/01/2012  Documentation initiated by:  [facility]  Subjective/Objective Assessment:   INCISION AND DRAINAGE BILATERAL AXILLARY ABSCESSES     Action/Plan:   lives at home with son   Anticipated DC Date:  10/01/2012   Anticipated DC Plan:  HOME W HOME HEALTH SERVICES      DC Planning Services  CM consult      [facility] Choice  HOME HEALTH   Choice offered to / List presented to:  C-1 Patient        HH arranged  HH-1 RN      [facility] agency  [facility]   Status of service:  Completed, signed off Medicare Important Message given?   (If response is "NO", the following Medicare IM given date fields will be blank) Date Medicare IM given:   Date Additional Medicare IM given:    Discharge Disposition:  HOME W HOME HEALTH SERVICES  Per UR Regulation:    If discussed at Long Length of Stay Meetings, dates discussed:    Comments:  10/01/2012 1245 NCM spoke to pt and offered choice for [facility]. Agreeable to [facility]. NCM contacted [facility] confirming HH RN available for soc tomorrow for dressing changes. Spoke to [facility] rep and they will have [facility] RN soc 12/2 for dressing changes for this pt.   Pt states her son will be there to learn how to do dressing change. NCM spoke to Enaya, Howze POA, # [phone]. States he is her caregiver and will be available to change dressings on the days [facility] does not come out. Explained they will come on 12/2 and Wed/Friday. NCM put [facility] contact info on dc instructions. [name] Donning RN CCM Case Mgmt phone [phone]

## 2012-10-01 NOTE — Progress Notes (Signed)
ANTIBIOTIC CONSULT NOTE - FOLLOW UP  Pharmacy Consult for vancomycin/ coumadin Indication: MRSA+ axillary abcess/ H/o of DVT/PE  Allergies  Allergen Reactions  . Codeine Phosphate Nausea And Vomiting  . Darvocet (Propoxyphene-Acetaminophen) Nausea And Vomiting  . Promethazine Hcl Nausea And Vomiting  . Sulfamethoxazole Other (See Comments)    Pt doesn't remember reaction  . Amoxicillin Nausea And Vomiting and Rash  . Penicillins Nausea And Vomiting and Rash    Patient Measurements: Height: 5\' 2"  (157.5 cm) Weight: 153 lb 14.1 oz (69.8 kg) IBW/kg (Calculated) : 50.1    Vital Signs: Temp: 97.7 F (36.5 C) (12/01 0600) Temp src: Oral (12/01 0600) BP: 144/61 mmHg (12/01 0600) Pulse Rate: 54  (12/01 0600) Intake/Output from previous day: 11/30 0701 - 12/01 0700 In: 600 [IV Piggyback:600] Out: 700 [Urine:700] Intake/Output from this shift:    Labs:  Basename 09/30/12 1134 09/29/12 1325  WBC -- --  HGB -- --  PLT -- --  LABCREA -- --  CREATININE 0.89 0.85   Estimated Creatinine Clearance: 39.2 ml/min (by C-G formula based on Cr of 0.89). No results found for this basename: VANCOTROUGH:2,VANCOPEAK:2,VANCORANDOM:2,GENTTROUGH:2,GENTPEAK:2,GENTRANDOM:2,TOBRATROUGH:2,TOBRAPEAK:2,TOBRARND:2,AMIKACINPEAK:2,AMIKACINTROU:2,AMIKACIN:2, in the last 72 hours   Microbiology: Recent Results (from the past 720 hour(s))  MRSA PCR SCREENING     Status: Abnormal   Collection Time   09/27/12  7:42 PM      Component Value Range Status Comment   MRSA by PCR POSITIVE (*) NEGATIVE Final   SURGICAL PCR SCREEN     Status: Abnormal   Collection Time   09/28/12  3:09 AM      Component Value Range Status Comment   MRSA, PCR POSITIVE (*) NEGATIVE Final    Staphylococcus aureus POSITIVE (*) NEGATIVE Final   CULTURE, ROUTINE-ABSCESS     Status: Normal   Collection Time   09/28/12  3:53 PM      Component Value Range Status Comment   Specimen Description ABSCESS AXILLA LEFT   Final    Special Requests NONE   Final    Gram Stain     Final    Value: NO WBC SEEN     NO SQUAMOUS EPITHELIAL CELLS SEEN     FEW GRAM POSITIVE COCCI IN CLUSTERS     IN PAIRS   Culture     Final    Value: MODERATE METHICILLIN RESISTANT STAPHYLOCOCCUS AUREUS     Note: RIFAMPIN AND GENTAMICIN SHOULD NOT BE USED AS SINGLE DRUGS FOR TREATMENT OF STAPH INFECTIONS. This organism DOES NOT demonstrate inducible Clindamycin resistance in vitro. CRITICAL RESULT CALLED TO, READ BACK BY AND VERIFIED WITH: ROBIN Z 12/01 @      830 BY REAMM   Report Status 10/01/2012 FINAL   Final    Organism ID, Bacteria METHICILLIN RESISTANT STAPHYLOCOCCUS AUREUS   Final   URINE CULTURE     Status: Normal   Collection Time   09/29/12  4:52 PM      Component Value Range Status Comment   Specimen Description URINE, CLEAN CATCH   Final    Special Requests NONE   Final    Culture  Setup Time 09/29/2012 17:35   Final    Colony Count NO GROWTH   Final    Culture NO GROWTH   Final    Report Status 09/30/2012 FINAL   Final     Anti-infectives     Start     Dose/Rate Route Frequency Ordered Stop   09/28/12 1600   vancomycin (VANCOCIN) IVPB 1000 mg/200 mL  premix        1,000 mg 200 mL/hr over 60 Minutes Intravenous Every 24 hours 09/28/12 1304     09/27/12 1800   ciprofloxacin (CIPRO) IVPB 400 mg        400 mg 200 mL/hr over 60 Minutes Intravenous Every 12 hours 09/27/12 1700            Assessment: 78 year old female s/p I&D 11/28 of bilateral axillary abscesses. 10mg  IV vitamin k and 2 U FFP given 11/28 for INR reversal prior to procedure. Coumadin restarted 11/29, INR this AM is 1.20. Will give slightly larger than home dose again tonight due to vitamin K administration on 11/28. PTA coumadin dose was 2.5 mg daily. INR on admit was therapeutic at 2.43.  Axillary abcess MRSA+ (resistant to erythro,penicillin,oxacillin, pan sensitive), WBC 8.9 (11/27)  Afeb.Rx consult for vancomycin. Scr stable, est crcl ~  41ml/min  Goal of Therapy:  Vancomycin trough level 10-15 mcg/ml INR 2-3  Plan:  1. Decrease vancomycin dose to 750 mg Iv q24h based on renal fxn 2. Vancomycin trough at steady state 3. Will give Coumadin 4 mg x 1  2. F/U INR in AM and s/sx  of bleeding   Bola A. Wandra Feinstein D Clinical Pharmacist Pager:[phone] Phone [phone] 10/01/2012 10:37 AM

## 2012-10-02 ENCOUNTER — Encounter (HOSPITAL_COMMUNITY): Payer: Self-pay | Admitting: General Surgery

## 2012-10-02 ENCOUNTER — Emergency Department (HOSPITAL_COMMUNITY)
Admission: EM | Admit: 2012-10-02 | Discharge: 2012-10-03 | Disposition: A | Discharge status: Home / Self Care | Payer: Medicare Other | Attending: Emergency Medicine | Admitting: Emergency Medicine

## 2012-10-02 ENCOUNTER — Emergency Department (HOSPITAL_COMMUNITY): Payer: Medicare Other

## 2012-10-02 DIAGNOSIS — I10 Essential (primary) hypertension: Secondary | ICD-10-CM | POA: Insufficient documentation

## 2012-10-02 DIAGNOSIS — G2581 Restless legs syndrome: Secondary | ICD-10-CM | POA: Insufficient documentation

## 2012-10-02 DIAGNOSIS — Z9889 Other specified postprocedural states: Secondary | ICD-10-CM | POA: Insufficient documentation

## 2012-10-02 DIAGNOSIS — K5900 Constipation, unspecified: Secondary | ICD-10-CM | POA: Insufficient documentation

## 2012-10-02 DIAGNOSIS — Z86718 Personal history of other venous thrombosis and embolism: Secondary | ICD-10-CM | POA: Insufficient documentation

## 2012-10-02 DIAGNOSIS — I2510 Atherosclerotic heart disease of native coronary artery without angina pectoris: Secondary | ICD-10-CM | POA: Insufficient documentation

## 2012-10-02 DIAGNOSIS — Z79899 Other long term (current) drug therapy: Secondary | ICD-10-CM | POA: Insufficient documentation

## 2012-10-02 DIAGNOSIS — Z853 Personal history of malignant neoplasm of breast: Secondary | ICD-10-CM | POA: Insufficient documentation

## 2012-10-02 DIAGNOSIS — F0280 Dementia in other diseases classified elsewhere without behavioral disturbance: Secondary | ICD-10-CM | POA: Insufficient documentation

## 2012-10-02 DIAGNOSIS — Z7901 Long term (current) use of anticoagulants: Secondary | ICD-10-CM | POA: Insufficient documentation

## 2012-10-02 DIAGNOSIS — K6289 Other specified diseases of anus and rectum: Secondary | ICD-10-CM | POA: Insufficient documentation

## 2012-10-02 DIAGNOSIS — F411 Generalized anxiety disorder: Secondary | ICD-10-CM | POA: Insufficient documentation

## 2012-10-02 HISTORY — DX: Malignant (primary) neoplasm, unspecified: C80.1

## 2012-10-02 MED ORDER — BISACODYL 10 MG RE SUPP
10.0000 mg | Freq: Once | RECTAL | Status: AC
  Administered 2012-10-02: 10 mg via RECTAL
  Filled 2012-10-02: qty 1

## 2012-10-02 NOTE — ED Notes (Signed)
Per EMS pt has been constipated x3 days.  Has hx of bowel obstruction. C/o rectal pain. Active bowel sounds. Pt denies CP SOB. Pt has boils under arm + for MRSA. Vital signs  BP 156/80 HR 82 R 18

## 2012-10-02 NOTE — ED Provider Notes (Signed)
History     CSN: [id number]  Arrival date & time 10/02/12  2051   First MD Initiated Contact with Patient 10/02/12 2100      Chief Complaint  Patient presents with  . Constipation  . Rectal Pain    (Consider location/radiation/quality/duration/timing/severity/associated sxs/prior treatment) HPI Comments: [name] HINCHEY is a 78 y.o. Female here for evaluation of abdominal pain, decreased appetite, and no bowel movement for 4 days. She was discharged from the hospital yesterday after an admission for axillary I&D. Her appetite has been poor for several days. Her son, is with her and does most of the history. There's been no noted fever, chills, cough, shortness of breath, or chest pain. No vomiting. She has had a history of constipation and fecal impaction, but none recently. There are no known modifying factors.  Patient is a 78 y.o. female presenting with constipation. The history is provided by the patient.  Constipation     Past Medical History  Diagnosis Date  . CAD (coronary artery disease)     S/p PTCA / stenting (last cath 2004, multiple LAD stents, 2 stents in the right coronary artery all patent)  . 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 Started coumadin  . HTN (hypertension)   . Restless leg syndrome   . PMR (polymyalgia rheumatica)   . Axillary abscess   . Anxiety   . Dementia   . Cancer     Past Surgical History  Procedure Date  . Colectomy   . Breast lumpectomy   . Stent     cardiac x 8 stents.  . Incision and drainage     bilateral axillary, non specific staff  . Incision and drainage abscess 09/28/2012    Procedure: INCISION AND DRAINAGE ABSCESS;  Surgeon: Liz Malady, MD;  Location: [facility] OR;  Service: General;  Laterality: Bilateral;    No family history on file.  History  Substance Use Topics  . Smoking status: Never Smoker   . Smokeless tobacco: Never Used  . Alcohol Use: No    OB History    Grav Para Term Preterm Abortions  TAB SAB Ect Mult Living                  Review of Systems  Gastrointestinal: Positive for constipation.  All other systems reviewed and are negative.    Allergies  Codeine phosphate; Darvocet; Promethazine hcl; Sulfamethoxazole; Amoxicillin; and Penicillins  Home Medications   Current Outpatient Rx  Name  Route  Sig  Dispense  Refill  . ACETAMINOPHEN 500 MG PO TABS   Oral   Take 500 mg by mouth every 6 (six) hours as needed. For pain         . AMLODIPINE BESYLATE 10 MG PO TABS   Oral   Take 10 mg by mouth daily.         . ATORVASTATIN CALCIUM 20 MG PO TABS   Oral   Take 10 mg by mouth daily.         Marland Kitchen BENZONATATE 100 MG PO CAPS   Oral   Take 100 mg by mouth 3 (three) times daily as needed. For cough         . VITAMIN D 1000 UNITS PO TABS   Oral   Take 2,000 Units by mouth daily.         . CHOLESTYRAMINE LIGHT 4 G PO PACK   Oral   Take 4 g by mouth daily as needed.  For loose stools         . CLINDAMYCIN HCL 300 MG PO CAPS   Oral   Take 1 capsule (300 mg total) by mouth 3 (three) times daily.   30 capsule   0   . DONEPEZIL HCL 10 MG PO TABS   Oral   Take 10 mg by mouth every morning.         Marland Kitchen FERROUS SULFATE 325 (65 FE) MG PO TBEC   Oral   Take 325 mg by mouth daily with breakfast.         . LORAZEPAM 0.5 MG PO TABS   Oral   Take 0.5 tablets (0.25 mg total) by mouth 3 (three) times daily as needed for anxiety. for anxiety   30 tablet   0   . LOSARTAN POTASSIUM 50 MG PO TABS   Oral   Take 50 mg by mouth daily.         Marland Kitchen MIRABEGRON ER 25 MG PO TB24   Oral   Take 25 mg by mouth daily.         Marland Kitchen NEXIUM 40 MG PO CPDR   Oral   Take 40 mg by mouth daily before breakfast.          . NITROGLYCERIN 0.4 MG SL SUBL   Sublingual   Place 0.4 mg under the tongue every 5 (five) minutes x 3 doses as needed. For chest pain         . CHILDRENS CHEWABLE MULTI VITS PO   Oral   Take 1 tablet by mouth daily.         Marland Kitchen POTASSIUM  CHLORIDE CRYS ER 20 MEQ PO TBCR   Oral   Take 20 mEq by mouth daily.         Marland Kitchen PREDNISONE 1 MG PO TABS   Oral   Take 2 mg by mouth daily.         Marland Kitchen ROPINIROLE HCL 1 MG PO TABS   Oral   Take 0.5-1 mg by mouth 3 (three) times daily. 1 tab at noon, 1/2 tab late afternoon, 1/2 tab one-three hours before bed         . TRAMADOL HCL 50 MG PO TABS   Oral   Take 25 mg by mouth every 6 (six) hours as needed. For pain         . WARFARIN SODIUM 2.5 MG PO TABS   Oral   Take 2.5 mg by mouth every evening.          Marland Kitchen ZALEPLON 10 MG PO CAPS   Oral   Take 1 capsule (10 mg total) by mouth at bedtime. Facility MD to refill   10 capsule   0     BP 185/81  Pulse 75  Temp 98.1 F (36.7 C) (Oral)  Resp 18  SpO2 97%  Physical Exam  Nursing note and vitals reviewed. Constitutional: She appears well-developed and well-nourished.  HENT:  Head: Normocephalic and atraumatic.  Eyes: Conjunctivae normal and EOM are normal. Pupils are equal, round, and reactive to light.  Neck: Normal range of motion and phonation normal. Neck supple.  Cardiovascular: Normal rate, regular rhythm and intact distal pulses.   Pulmonary/Chest: Effort normal and breath sounds normal. She exhibits no tenderness.  Abdominal: Soft. She exhibits distension. She exhibits no mass. There is tenderness (diffuse, mild). There is no rebound and no guarding.  Genitourinary:       Firm [place] stool in rectum,  no fecal impaction  Musculoskeletal: Normal range of motion.  Neurological: She is alert. She has normal strength. No cranial nerve deficit. She exhibits normal muscle tone. Coordination normal.  Skin: Skin is warm and dry.  Psychiatric: She has a normal mood and affect. Her behavior is normal.    ED Course  Procedures (including critical care time)   Patient's pain worsened while in the ED. She was treated with IV fluids, and IV, morphine. She received a soapsuds enema. After the enema; she had a large volume  stool and felt better.   02:05- abdomen is soft with minimal tenderness. Oral fluid trial  03:00- the patient has had difficulty swallowing for 3 months. Her PCP has considered getting a swallowing evaluation done. The symptoms wax and wane. She is not on a dysphasia diet. She was able to tolerate some oral fluids without vomiting in the emergency department. According to the son, she is at her baseline relative to swallowing, now. The patient recently had a comprehensive evaluation by physical therapy in the hospital. The last note is dated 09/30/12. They recommended outpatient occupational therapy assessment and treatment. The patient is currently getting in home health services. I have written an order for in home physical and occupational therapy assessment and treatment. The son understands, and is in agreement with this plan.    Labs Reviewed  BASIC METABOLIC PANEL - Abnormal; Notable for the following:    Glucose, Bld 110 (*)     GFR calc non Af Amer 58 (*)     GFR calc Af Amer 67 (*)     All other components within normal limits  OCCULT BLOOD, POC DEVICE  CBC WITH DIFFERENTIAL   Dg Abd Acute W/chest  10/03/2012  *RADIOLOGY REPORT*  Clinical Data: Abdominal pain and distention.  Constipation. Anorexia.  ACUTE ABDOMEN SERIES (ABDOMEN 2 VIEW & CHEST 1 VIEW)  Comparison: Chest radiograph on 08/24/2012  Findings: Scattered bowel air fluid levels are seen within both small bowel and colon.  No evidence of dilated bowel loops.  No evidence of free intraperitoneal air.  No radiopaque calculi identified.  Elevation of right hemidiaphragm is stable.  Mild bibasilar scarring also unchanged.  No evidence of acute infiltrate or edema. No evidence of pleural effusion.  Heart size is within normal limits.  No mass or lymphadenopathy identified.  IMPRESSION:  1.  Nonspecific, nonobstructive bowel gas pattern. 2.  No active cardiopulmonary disease.   Original Report Authenticated By: Myles Rosenthal, M.D.       1. Constipation       MDM  Abdominal discomfort, and decreased feeling, secondary to constipation. No evidence for  bowel obstruction. Patient is improved, with treatment in the emergency department.     Plan: Home Medications-  usual plus, Colace; Home Treatments- rest, fluids, and fiber; Recommended follow up- PCP, when necessary         Flint Melter, MD 10/03/12 [phone]

## 2012-10-03 LAB — CBC WITH DIFFERENTIAL/PLATELET
Basophils Absolute: 0 10*3/uL (ref 0.0–0.1)
Basophils Relative: 0 % (ref 0–1)
HCT: 38.9 % (ref 36.0–46.0)
Hemoglobin: 12.8 g/dL (ref 12.0–15.0)
Lymphocytes Relative: 20 % (ref 12–46)
Lymphs Abs: 2.1 10*3/uL (ref 0.7–4.0)
MCH: 28.8 pg (ref 26.0–34.0)
MCV: 87.6 fL (ref 78.0–100.0)
RBC: 4.44 MIL/uL (ref 3.87–5.11)
WBC: 10.5 10*3/uL (ref 4.0–10.5)

## 2012-10-03 LAB — BASIC METABOLIC PANEL
Chloride: 100 mEq/L (ref 96–112)
Creatinine, Ser: 0.86 mg/dL (ref 0.50–1.10)
GFR calc Af Amer: 67 mL/min — ABNORMAL LOW (ref >90)
GFR calc non Af Amer: 58 mL/min — ABNORMAL LOW (ref >90)
Potassium: 4.2 mEq/L (ref 3.5–5.1)

## 2012-10-03 MED ORDER — SODIUM CHLORIDE 0.9 % IV SOLN
INTRAVENOUS | Status: DC
  Administered 2012-10-03: 01:00:00 via INTRAVENOUS

## 2012-10-03 MED ORDER — ONDANSETRON HCL 4 MG/2ML IJ SOLN
4.0000 mg | Freq: Once | INTRAMUSCULAR | Status: AC
  Administered 2012-10-03: 4 mg via INTRAVENOUS
  Filled 2012-10-03: qty 2

## 2012-10-03 MED ORDER — MORPHINE SULFATE 2 MG/ML IJ SOLN
2.0000 mg | Freq: Once | INTRAMUSCULAR | Status: AC
  Administered 2012-10-03: 2 mg via INTRAVENOUS
  Filled 2012-10-03: qty 1

## 2012-10-05 ENCOUNTER — Emergency Department (HOSPITAL_COMMUNITY)
Admission: EM | Admit: 2012-10-05 | Discharge: 2012-10-05 | Disposition: A | Discharge status: Home / Self Care | Payer: Medicare Other | Attending: Emergency Medicine | Admitting: Emergency Medicine

## 2012-10-05 ENCOUNTER — Encounter (HOSPITAL_COMMUNITY): Payer: Self-pay | Admitting: *Deleted

## 2012-10-05 DIAGNOSIS — F0390 Unspecified dementia without behavioral disturbance: Secondary | ICD-10-CM | POA: Insufficient documentation

## 2012-10-05 DIAGNOSIS — I10 Essential (primary) hypertension: Secondary | ICD-10-CM | POA: Insufficient documentation

## 2012-10-05 DIAGNOSIS — Z79899 Other long term (current) drug therapy: Secondary | ICD-10-CM | POA: Insufficient documentation

## 2012-10-05 DIAGNOSIS — R1110 Vomiting, unspecified: Secondary | ICD-10-CM | POA: Insufficient documentation

## 2012-10-05 DIAGNOSIS — R1310 Dysphagia, unspecified: Secondary | ICD-10-CM | POA: Insufficient documentation

## 2012-10-05 DIAGNOSIS — Z859 Personal history of malignant neoplasm, unspecified: Secondary | ICD-10-CM | POA: Insufficient documentation

## 2012-10-05 DIAGNOSIS — Z86718 Personal history of other venous thrombosis and embolism: Secondary | ICD-10-CM | POA: Insufficient documentation

## 2012-10-05 DIAGNOSIS — G2581 Restless legs syndrome: Secondary | ICD-10-CM | POA: Insufficient documentation

## 2012-10-05 DIAGNOSIS — IMO0002 Reserved for concepts with insufficient information to code with codable children: Secondary | ICD-10-CM | POA: Insufficient documentation

## 2012-10-05 DIAGNOSIS — I2510 Atherosclerotic heart disease of native coronary artery without angina pectoris: Secondary | ICD-10-CM | POA: Insufficient documentation

## 2012-10-05 DIAGNOSIS — Z7901 Long term (current) use of anticoagulants: Secondary | ICD-10-CM | POA: Insufficient documentation

## 2012-10-05 DIAGNOSIS — F411 Generalized anxiety disorder: Secondary | ICD-10-CM | POA: Insufficient documentation

## 2012-10-05 DIAGNOSIS — M353 Polymyalgia rheumatica: Secondary | ICD-10-CM | POA: Insufficient documentation

## 2012-10-05 LAB — COMPREHENSIVE METABOLIC PANEL
AST: 18 U/L (ref 0–37)
Albumin: 3.6 g/dL (ref 3.5–5.2)
Chloride: 104 mEq/L (ref 96–112)
Creatinine, Ser: 0.84 mg/dL (ref 0.50–1.10)
Potassium: 4 mEq/L (ref 3.5–5.1)
Total Bilirubin: 0.2 mg/dL — ABNORMAL LOW (ref 0.3–1.2)

## 2012-10-05 LAB — CBC WITH DIFFERENTIAL/PLATELET
Basophils Absolute: 0.1 10*3/uL (ref 0.0–0.1)
Basophils Relative: 1 % (ref 0–1)
MCHC: 32.9 g/dL (ref 30.0–36.0)
Monocytes Absolute: 0.5 10*3/uL (ref 0.1–1.0)
Neutro Abs: 3.9 10*3/uL (ref 1.7–7.7)
Neutrophils Relative %: 59 % (ref 43–77)
Platelets: 256 10*3/uL (ref 150–400)
RDW: 13.9 % (ref 11.5–15.5)

## 2012-10-05 MED ORDER — GLUCAGON HCL (RDNA) 1 MG IJ SOLR
1.0000 mg | Freq: Once | INTRAMUSCULAR | Status: AC
  Administered 2012-10-05: 1 mg via INTRAVENOUS
  Filled 2012-10-05: qty 1

## 2012-10-05 MED ORDER — SODIUM CHLORIDE 0.9 % IV BOLUS (SEPSIS)
250.0000 mL | Freq: Once | INTRAVENOUS | Status: AC
  Administered 2012-10-05: 250 mL via INTRAVENOUS

## 2012-10-05 NOTE — ED Notes (Signed)
MD at bedside. 

## 2012-10-05 NOTE — ED Notes (Signed)
Pt A.O. X 4. NAD. Respirations even and regular. Able to swallow secretions with no difficulty. Denies pain. Denies N/V/D/C. Denies SOB. Son at bed side. Verbalized understanding of need to make a follow up appointment tomorrow morning at 0830 with the GI specialist. No prescriptions  given. No further needs at this time.

## 2012-10-05 NOTE — ED Notes (Signed)
The pt was discussed with dr ghim and due to the lab results from Monday he does not feel that any need to be drawn until she is seen by the edp.

## 2012-10-05 NOTE — ED Notes (Signed)
The pt is handling her saliva with no difficulty.  She has not had her antibiotics today according to the son because she cannot swallow.

## 2012-10-05 NOTE — ED Notes (Signed)
The pt has not been able to swallow for one week.  She has not been eating etc.  She was just here Monday night for abd pain and distention and not swallowing.  The family wants her admitted.  She was also just d-cd from this hospital one week ago

## 2012-10-05 NOTE — ED Provider Notes (Signed)
History     CSN: [id number]  Arrival date & time 10/05/12  1728   First MD Initiated Contact with Patient 10/05/12 2016      Chief Complaint  Patient presents with  . unable to swallow     (Consider location/radiation/quality/duration/timing/severity/associated sxs/prior treatment) Patient is a 78 y.o. female presenting with vomiting. The history is provided by the patient (the pt has been having problems swallowing for one week.  it is getting worse.  she can only swallow liquids). No language interpreter was used.  Emesis  This is a chronic problem. The current episode started more than 2 days ago. The problem occurs continuously. The problem has not changed since onset.Vomiting appearance: nothing. There has been no fever. Pertinent negatives include no abdominal pain, no chills, no cough, no diarrhea and no headaches.    Past Medical History  Diagnosis Date  . CAD (coronary artery disease)     S/p PTCA / stenting (last cath 2004, multiple LAD stents, 2 stents in the right coronary artery all patent)  . 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 Started coumadin  . HTN (hypertension)   . Restless leg syndrome   . PMR (polymyalgia rheumatica)   . Axillary abscess   . Anxiety   . Dementia   . Cancer     Past Surgical History  Procedure Date  . Colectomy   . Breast lumpectomy   . Stent     cardiac x 8 stents.  . Incision and drainage     bilateral axillary, non specific staff  . Incision and drainage abscess 09/28/2012    Procedure: INCISION AND DRAINAGE ABSCESS;  Surgeon: Liz Malady, MD;  Location: [facility] OR;  Service: General;  Laterality: Bilateral;    No family history on file.  History  Substance Use Topics  . Smoking status: Never Smoker   . Smokeless tobacco: Never Used  . Alcohol Use: No    OB History    Grav Para Term Preterm Abortions TAB SAB Ect Mult Living                  Review of Systems  Constitutional: Negative for chills and  fatigue.  HENT: Negative for congestion, sinus pressure and ear discharge.        Problems swallowing  Eyes: Negative for discharge.  Respiratory: Negative for cough.   Cardiovascular: Negative for chest pain.  Gastrointestinal: Positive for vomiting. Negative for abdominal pain and diarrhea.  Genitourinary: Negative for frequency and hematuria.  Musculoskeletal: Negative for back pain.  Skin: Negative for rash.  Neurological: Negative for seizures and headaches.  Hematological: Negative.   Psychiatric/Behavioral: Negative for hallucinations.    Allergies  Codeine phosphate; Darvocet; Promethazine hcl; Sulfamethoxazole; Amoxicillin; and Penicillins  Home Medications   Current Outpatient Rx  Name  Route  Sig  Dispense  Refill  . ACETAMINOPHEN 500 MG PO TABS   Oral   Take 500 mg by mouth every 6 (six) hours as needed. For pain         . AMLODIPINE BESYLATE 10 MG PO TABS   Oral   Take 10 mg by mouth every morning.          . ATORVASTATIN CALCIUM 20 MG PO TABS   Oral   Take 10 mg by mouth every evening.          Marland Kitchen BENZONATATE 100 MG PO CAPS   Oral   Take 100 mg by mouth 3 (three) times  daily as needed. For cough         . VITAMIN D 1000 UNITS PO TABS   Oral   Take 2,000 Units by mouth daily.         . CHOLESTYRAMINE LIGHT 4 G PO PACK   Oral   Take 4 g by mouth daily as needed. For loose stools         . CLINDAMYCIN HCL 300 MG PO CAPS   Oral   Take 1 capsule (300 mg total) by mouth 3 (three) times daily.   30 capsule   0   . DONEPEZIL HCL 10 MG PO TABS   Oral   Take 10 mg by mouth every morning.         Marland Kitchen FERROUS SULFATE 325 (65 FE) MG PO TBEC   Oral   Take 325 mg by mouth daily with breakfast.         . LORAZEPAM 0.5 MG PO TABS   Oral   Take 0.25 mg by mouth 3 (three) times daily as needed. for anxiety         . LOSARTAN POTASSIUM 50 MG PO TABS   Oral   Take 50 mg by mouth at bedtime.          Marland Kitchen MIRABEGRON ER 25 MG PO TB24   Oral    Take 25 mg by mouth every evening.          Marland Kitchen NEXIUM 40 MG PO CPDR   Oral   Take 40 mg by mouth daily before breakfast.          . NITROGLYCERIN 0.4 MG SL SUBL   Sublingual   Place 0.4 mg under the tongue every 5 (five) minutes x 3 doses as needed. For chest pain         . CHILDRENS CHEWABLE MULTI VITS PO   Oral   Take 1 tablet by mouth daily.         Marland Kitchen POTASSIUM CHLORIDE CRYS ER 20 MEQ PO TBCR   Oral   Take 20 mEq by mouth daily.         Marland Kitchen PREDNISONE 1 MG PO TABS   Oral   Take 2 mg by mouth every morning.          Marland Kitchen ROPINIROLE HCL 1 MG PO TABS   Oral   Take 0.5-1 mg by mouth 3 (three) times daily. 1 tab at noon, 1/2 tab late afternoon, 1/2 tab one-three hours before bed         . TRAMADOL HCL 50 MG PO TABS   Oral   Take 25 mg by mouth every 6 (six) hours as needed. For pain         . WARFARIN SODIUM 2.5 MG PO TABS   Oral   Take 2.5 mg by mouth every evening.          Marland Kitchen ZALEPLON 10 MG PO CAPS   Oral   Take 1 capsule (10 mg total) by mouth at bedtime. Facility MD to refill   10 capsule   0     BP 143/52  Pulse 62  Temp 98.2 F (36.8 C) (Oral)  Resp 20  SpO2 98%  Physical Exam  Constitutional: She is oriented to person, place, and time. She appears well-developed.  HENT:  Head: Normocephalic and atraumatic.       Pt can swallow water only.  She has pain swallowing water  Eyes: Conjunctivae normal and EOM are normal.  No scleral icterus.  Neck: Neck supple. No thyromegaly present.  Cardiovascular: Normal rate and regular rhythm.  Exam reveals no gallop and no friction rub.   No murmur heard. Pulmonary/Chest: No stridor. She has no wheezes. She has no rales. She exhibits no tenderness.  Abdominal: She exhibits no distension. There is no tenderness. There is no rebound.  Musculoskeletal: Normal range of motion. She exhibits no edema.  Lymphadenopathy:    She has no cervical adenopathy.  Neurological: She is oriented to person, place, and  time. Coordination normal.  Skin: No rash noted. No erythema.  Psychiatric: She has a normal mood and affect. Her behavior is normal.    ED Course  Procedures (including critical care time)  Labs Reviewed  COMPREHENSIVE METABOLIC PANEL - Abnormal; Notable for the following:    Glucose, Bld 101 (*)     Total Bilirubin 0.2 (*)     GFR calc non Af Amer 60 (*)     GFR calc Af Amer 69 (*)     All other components within normal limits  CBC WITH DIFFERENTIAL   No results found.   1. Swallowing difficulty     I spoke with gi Dr. Marina Goodell.  He stated to have the pts son call the office at 830am and they will try to get her seen tomorrow am in the office  MDM          Benny Lennert, MD 10/05/12 2225

## 2012-10-06 ENCOUNTER — Encounter (HOSPITAL_COMMUNITY): Payer: Self-pay | Admitting: General Practice

## 2012-10-06 ENCOUNTER — Encounter: Payer: Self-pay | Admitting: Nurse Practitioner

## 2012-10-06 ENCOUNTER — Telehealth: Payer: Self-pay | Admitting: Internal Medicine

## 2012-10-06 ENCOUNTER — Inpatient Hospital Stay (HOSPITAL_COMMUNITY)
Admission: AD | Admit: 2012-10-06 | Discharge: 2012-10-11 | DRG: 392 | Disposition: A | Discharge status: Skilled Nursing Facility | Payer: Medicare Other | Source: Ambulatory Visit | Attending: Internal Medicine | Admitting: Internal Medicine

## 2012-10-06 ENCOUNTER — Ambulatory Visit (INDEPENDENT_AMBULATORY_CARE_PROVIDER_SITE_OTHER): Payer: Medicare Other | Admitting: Nurse Practitioner

## 2012-10-06 VITALS — BP 118/62 | HR 64 | Ht 62.0 in | Wt 148.0 lb

## 2012-10-06 DIAGNOSIS — Z881 Allergy status to other antibiotic agents status: Secondary | ICD-10-CM

## 2012-10-06 DIAGNOSIS — R1310 Dysphagia, unspecified: Secondary | ICD-10-CM

## 2012-10-06 DIAGNOSIS — Z792 Long term (current) use of antibiotics: Secondary | ICD-10-CM

## 2012-10-06 DIAGNOSIS — L02412 Cutaneous abscess of left axilla: Secondary | ICD-10-CM

## 2012-10-06 DIAGNOSIS — I10 Essential (primary) hypertension: Secondary | ICD-10-CM

## 2012-10-06 DIAGNOSIS — Z79899 Other long term (current) drug therapy: Secondary | ICD-10-CM

## 2012-10-06 DIAGNOSIS — Z882 Allergy status to sulfonamides status: Secondary | ICD-10-CM

## 2012-10-06 DIAGNOSIS — D649 Anemia, unspecified: Secondary | ICD-10-CM

## 2012-10-06 DIAGNOSIS — I2510 Atherosclerotic heart disease of native coronary artery without angina pectoris: Secondary | ICD-10-CM

## 2012-10-06 DIAGNOSIS — Z86718 Personal history of other venous thrombosis and embolism: Secondary | ICD-10-CM

## 2012-10-06 DIAGNOSIS — Z880 Allergy status to penicillin: Secondary | ICD-10-CM

## 2012-10-06 DIAGNOSIS — F0390 Unspecified dementia without behavioral disturbance: Secondary | ICD-10-CM

## 2012-10-06 DIAGNOSIS — IMO0002 Reserved for concepts with insufficient information to code with codable children: Secondary | ICD-10-CM | POA: Diagnosis present

## 2012-10-06 DIAGNOSIS — I749 Embolism and thrombosis of unspecified artery: Secondary | ICD-10-CM

## 2012-10-06 DIAGNOSIS — E785 Hyperlipidemia, unspecified: Secondary | ICD-10-CM | POA: Diagnosis present

## 2012-10-06 DIAGNOSIS — L0291 Cutaneous abscess, unspecified: Secondary | ICD-10-CM

## 2012-10-06 DIAGNOSIS — Z86711 Personal history of pulmonary embolism: Secondary | ICD-10-CM

## 2012-10-06 DIAGNOSIS — Z888 Allergy status to other drugs, medicaments and biological substances status: Secondary | ICD-10-CM

## 2012-10-06 DIAGNOSIS — Z860100 Personal history of colon polyps, unspecified: Secondary | ICD-10-CM

## 2012-10-06 DIAGNOSIS — M353 Polymyalgia rheumatica: Secondary | ICD-10-CM | POA: Diagnosis present

## 2012-10-06 DIAGNOSIS — Z7901 Long term (current) use of anticoagulants: Secondary | ICD-10-CM

## 2012-10-06 DIAGNOSIS — A4902 Methicillin resistant Staphylococcus aureus infection, unspecified site: Secondary | ICD-10-CM | POA: Diagnosis present

## 2012-10-06 DIAGNOSIS — F411 Generalized anxiety disorder: Secondary | ICD-10-CM | POA: Diagnosis present

## 2012-10-06 DIAGNOSIS — K222 Esophageal obstruction: Principal | ICD-10-CM | POA: Diagnosis present

## 2012-10-06 DIAGNOSIS — I8290 Acute embolism and thrombosis of unspecified vein: Secondary | ICD-10-CM

## 2012-10-06 DIAGNOSIS — K219 Gastro-esophageal reflux disease without esophagitis: Secondary | ICD-10-CM | POA: Diagnosis present

## 2012-10-06 DIAGNOSIS — Z885 Allergy status to narcotic agent status: Secondary | ICD-10-CM

## 2012-10-06 DIAGNOSIS — Z853 Personal history of malignant neoplasm of breast: Secondary | ICD-10-CM

## 2012-10-06 DIAGNOSIS — Z9861 Coronary angioplasty status: Secondary | ICD-10-CM

## 2012-10-06 DIAGNOSIS — G2581 Restless legs syndrome: Secondary | ICD-10-CM | POA: Diagnosis present

## 2012-10-06 DIAGNOSIS — R197 Diarrhea, unspecified: Secondary | ICD-10-CM | POA: Diagnosis present

## 2012-10-06 HISTORY — DX: Other specified postprocedural states: Z98.890

## 2012-10-06 HISTORY — DX: Other complications of anesthesia, initial encounter: T88.59XA

## 2012-10-06 HISTORY — DX: Adverse effect of unspecified anesthetic, initial encounter: T41.45XA

## 2012-10-06 HISTORY — DX: Gastro-esophageal reflux disease without esophagitis: K21.9

## 2012-10-06 HISTORY — DX: Dysphagia, unspecified: R13.10

## 2012-10-06 HISTORY — DX: Nausea with vomiting, unspecified: R11.2

## 2012-10-06 LAB — PROTIME-INR
INR: 1.59 — ABNORMAL HIGH (ref 0.00–1.49)
Prothrombin Time: 18.5 seconds — ABNORMAL HIGH (ref 11.6–15.2)

## 2012-10-06 LAB — CBC
MCH: 28.3 pg (ref 26.0–34.0)
MCV: 86.6 fL (ref 78.0–100.0)
Platelets: 254 10*3/uL (ref 150–400)
RBC: 4.56 MIL/uL (ref 3.87–5.11)

## 2012-10-06 MED ORDER — ZOLPIDEM TARTRATE 5 MG PO TABS
5.0000 mg | ORAL_TABLET | Freq: Every evening | ORAL | Status: DC | PRN
  Administered 2012-10-06 – 2012-10-10 (×5): 5 mg via ORAL
  Filled 2012-10-06 (×5): qty 1

## 2012-10-06 MED ORDER — ENOXAPARIN SODIUM 30 MG/0.3ML ~~LOC~~ SOLN
30.0000 mg | SUBCUTANEOUS | Status: DC
  Filled 2012-10-06: qty 0.3

## 2012-10-06 MED ORDER — SACCHAROMYCES BOULARDII 250 MG PO CAPS
250.0000 mg | ORAL_CAPSULE | Freq: Two times a day (BID) | ORAL | Status: DC
  Administered 2012-10-06 – 2012-10-11 (×10): 250 mg via ORAL
  Filled 2012-10-06 (×11): qty 1

## 2012-10-06 MED ORDER — NITROGLYCERIN 0.4 MG SL SUBL: 0.4000 mg | SUBLINGUAL_TABLET | SUBLINGUAL | Status: DC | PRN

## 2012-10-06 MED ORDER — ROPINIROLE HCL 1 MG PO TABS
1.0000 mg | ORAL_TABLET | Freq: Every day | ORAL | Status: DC
  Administered 2012-10-07 – 2012-10-11 (×4): 1 mg via ORAL
  Filled 2012-10-06 (×5): qty 1

## 2012-10-06 MED ORDER — AMLODIPINE BESYLATE 10 MG PO TABS
10.0000 mg | ORAL_TABLET | Freq: Every morning | ORAL | Status: DC
  Administered 2012-10-07 – 2012-10-11 (×5): 10 mg via ORAL
  Filled 2012-10-06 (×5): qty 1

## 2012-10-06 MED ORDER — ROPINIROLE HCL 0.5 MG PO TABS: 0.5000 mg | ORAL_TABLET | Freq: Three times a day (TID) | ORAL | Status: DC

## 2012-10-06 MED ORDER — ATORVASTATIN CALCIUM 10 MG PO TABS
10.0000 mg | ORAL_TABLET | Freq: Every day | ORAL | Status: DC
  Administered 2012-10-06 – 2012-10-10 (×5): 10 mg via ORAL
  Filled 2012-10-06 (×6): qty 1

## 2012-10-06 MED ORDER — ROPINIROLE HCL 0.5 MG PO TABS
0.5000 mg | ORAL_TABLET | ORAL | Status: DC
  Administered 2012-10-07 – 2012-10-10 (×8): 0.5 mg via ORAL
  Filled 2012-10-06 (×11): qty 1

## 2012-10-06 MED ORDER — ACETAMINOPHEN 500 MG PO TABS
500.0000 mg | ORAL_TABLET | Freq: Four times a day (QID) | ORAL | Status: DC | PRN
  Administered 2012-10-08 (×2): 500 mg via ORAL
  Filled 2012-10-06 (×3): qty 1

## 2012-10-06 MED ORDER — PANTOPRAZOLE SODIUM 40 MG IV SOLR
40.0000 mg | Freq: Every day | INTRAVENOUS | Status: DC
  Administered 2012-10-06 – 2012-10-08 (×3): 40 mg via INTRAVENOUS
  Filled 2012-10-06 (×4): qty 40

## 2012-10-06 MED ORDER — ENOXAPARIN SODIUM 80 MG/0.8ML ~~LOC~~ SOLN
70.0000 mg | Freq: Two times a day (BID) | SUBCUTANEOUS | Status: AC
  Administered 2012-10-06 – 2012-10-09 (×6): 70 mg via SUBCUTANEOUS
  Filled 2012-10-06 (×11): qty 0.8

## 2012-10-06 MED ORDER — DONEPEZIL HCL 10 MG PO TABS
10.0000 mg | ORAL_TABLET | Freq: Every morning | ORAL | Status: DC
  Administered 2012-10-07 – 2012-10-11 (×5): 10 mg via ORAL
  Filled 2012-10-06 (×5): qty 1

## 2012-10-06 MED ORDER — SODIUM CHLORIDE 0.9 % IV SOLN
INTRAVENOUS | Status: DC
  Administered 2012-10-06: 21:00:00 via INTRAVENOUS
  Administered 2012-10-07: 1000 mL via INTRAVENOUS
  Administered 2012-10-08: 500 mL via INTRAVENOUS
  Administered 2012-10-08: 02:00:00 via INTRAVENOUS
  Administered 2012-10-10: 500 mL via INTRAVENOUS

## 2012-10-06 MED ORDER — TRAMADOL HCL 50 MG PO TABS
25.0000 mg | ORAL_TABLET | Freq: Four times a day (QID) | ORAL | Status: DC | PRN
  Filled 2012-10-06: qty 1

## 2012-10-06 MED ORDER — LORAZEPAM 0.5 MG PO TABS: 0.2500 mg | ORAL_TABLET | Freq: Four times a day (QID) | ORAL | Status: DC | PRN

## 2012-10-06 MED ORDER — CLINDAMYCIN PHOSPHATE 300 MG/50ML IV SOLN
300.0000 mg | Freq: Three times a day (TID) | INTRAVENOUS | Status: DC
  Administered 2012-10-06 – 2012-10-09 (×9): 300 mg via INTRAVENOUS
  Filled 2012-10-06 (×11): qty 50

## 2012-10-06 NOTE — Progress Notes (Signed)
1800 Dressing changed to bilateral axillary right arm with 2x1cm and left axilla 1x.5 cm per MD. Patient arrived to floor from home with son. Nontelemetry. Patient watched safety video.

## 2012-10-06 NOTE — Telephone Encounter (Signed)
Dr. Marina Goodell please advise, do you know about this pt? Do you want him to see a midlevel?

## 2012-10-06 NOTE — H&P (Addendum)
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 Through her hospitalization she had worsening of her dysphagia, she was noticed to have trouble swallowing liquids at times, subsequently she was discharged home on Sunday and since then couldn't even take her pills. In addition she developed constipation and abdominal distention and went back to the ER on Tuesday where she was given a suppository and enema followed by a large bowel movement then sent back home. Due to progressive nature of her dysphagia her son consulted with the primary care physician and he brought her back to the emergency room yesterday and the ED P. referred the patient to GI for an appointment today, she was seen by Willette Cluster, NP with [facility], she was subsequently sent to the hospital as a direct admit. In terms of her dysphagia patient feels like fluid and pills and sometimes liquids get stuck in her throat, and now she has no appetite as well, this was originally noticed about 4-5 months ago, cyanosis or little bit of improvement in September but then since November her swallowing has gradually worsened. Patient's son recalls having an endoscopy remotely, for unclear indication   Allergies:   Allergies  Allergen Reactions  . Codeine Phosphate Nausea And Vomiting  . Darvocet (Propoxyphene-Acetaminophen) Nausea And Vomiting  . Promethazine Hcl Nausea And Vomiting  . Sulfamethoxazole Other (See Comments)    Pt doesn't remember reaction  . Amoxicillin Nausea And Vomiting and Rash  . Penicillins Nausea And Vomiting and Rash      Past Medical History  Diagnosis Date  . CAD  (coronary artery disease)     S/p PTCA / stenting (last cath 2004, multiple LAD stents, 2 stents in the right coronary artery all patent)  . 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 Started coumadin  . HTN (hypertension)   . Restless leg syndrome   . PMR (polymyalgia rheumatica)   . Axillary abscess   . Anxiety   . Dementia   . Cancer     Past Surgical History  Procedure Date  . Colectomy   . Breast lumpectomy   . Stent     cardiac x 8 stents.  . Incision and drainage     bilateral axillary, non specific staff  . Incision and drainage abscess 09/28/2012    Procedure: INCISION AND DRAINAGE ABSCESS;  Surgeon: Liz Malady, MD;  Location: [facility] OR;  Service: General;  Laterality: Bilateral;    Prior to Admission medications   Medication Sig Start Date End Date Taking? Authorizing Provider  acetaminophen (TYLENOL) 500 MG tablet Take 500 mg by mouth every 6 (six) hours as needed. For pain    Historical Provider, MD  amLODipine (NORVASC) 10 MG tablet Take 10 mg by mouth every morning.     Historical Provider, MD  atorvastatin (LIPITOR) 20 MG tablet Take 10 mg by mouth every evening.     Historical Provider, MD  cholecalciferol (VITAMIN D) 1000 UNITS tablet Take 2,000 Units by mouth daily.    Historical Provider, MD  clindamycin (CLEOCIN) 300 MG capsule Take 1 capsule (300 mg total) by mouth 3 (three) times  daily. 10/01/12   Megan Dort, PA-C  donepezil (ARICEPT) 10 MG tablet Take 10 mg by mouth every morning.    Historical Provider, MD  ferrous sulfate 325 (65 FE) MG EC tablet Take 325 mg by mouth daily with breakfast. 03/05/12 03/05/13  Ok Anis, NP  LORazepam (ATIVAN) 0.5 MG tablet Take 0.25 mg by mouth 3 (three) times daily as needed. for anxiety 06/14/12   Ripudeep Jenna Luo, MD  losartan (COZAAR) 50 MG tablet Take 50 mg by mouth at bedtime.     Historical Provider, MD  mirabegron ER (MYRBETRIQ) 25 MG TB24 Take 25 mg by mouth every evening.     Historical Provider, MD   NEXIUM 40 MG capsule Take 40 mg by mouth daily before breakfast.  03/05/12   Historical Provider, MD  nitroGLYCERIN (NITROSTAT) 0.4 MG SL tablet Place 0.4 mg under the tongue every 5 (five) minutes x 3 doses as needed. For chest pain 03/05/12 03/05/13  Ok Anis, NP  Pediatric Multiple Vit-C-FA (CHILDRENS CHEWABLE MULTI VITS PO) Take 1 tablet by mouth daily.    Historical Provider, MD  potassium chloride SA (K-DUR,KLOR-CON) 20 MEQ tablet Take 20 mEq by mouth daily.    Historical Provider, MD  predniSONE (DELTASONE) 1 MG tablet Take 2 mg by mouth every morning.     Historical Provider, MD  rOPINIRole (REQUIP) 1 MG tablet Take 0.5-1 mg by mouth 3 (three) times daily. 1 tab at noon, 1/2 tab late afternoon, 1/2 tab one-three hours before bed    Historical Provider, MD  traMADol (ULTRAM) 50 MG tablet Take 25 mg by mouth every 6 (six) hours as needed. For pain    Historical Provider, MD  warfarin (COUMADIN) 2.5 MG tablet Take 2.5 mg by mouth every evening.     Historical Provider, MD  zaleplon (SONATA) 10 MG capsule Take 1 capsule (10 mg total) by mouth at bedtime. Facility MD to refill 06/14/12   Ripudeep Jenna Luo, MD    Social History:  reports that she has never smoked. She has never used smokeless tobacco. She reports that she does not drink alcohol or use illicit drugs.  No family history on file.  Review of Systems:  Constitutional: Denies fever, chills, diaphoresis, appetite change and fatigue.  HEENT: Denies photophobia, eye pain, redness, hearing loss, ear pain, congestion, sore throat, rhinorrhea, sneezing, mouth sores, trouble swallowing, neck pain, neck stiffness and tinnitus.   Respiratory: Denies SOB, DOE, cough, chest tightness,  and wheezing.   Cardiovascular: Denies chest pain, palpitations and leg swelling.  Gastrointestinal: Denies nausea, vomiting, abdominal pain, diarrhea, constipation, blood in stool and abdominal distention.  Genitourinary: Denies dysuria, urgency, frequency,  hematuria, flank pain and difficulty urinating.  Musculoskeletal: Denies myalgias, back pain, joint swelling, arthralgias and gait problem.  Skin: Denies pallor, rash and wound.  Neurological: Denies dizziness, seizures, syncope, weakness, light-headedness, numbness and headaches.  Hematological: Denies adenopathy. Easy bruising, personal or family bleeding history  Psychiatric/Behavioral: Denies suicidal ideation, mood changes, confusion, nervousness, sleep disturbance and agitation   Physical Exam: There were no vitals taken for this visit. General: Pleasant, white female alert awake oriented x3 in no acute distress  Head: Normocephalic and atraumatic  Eyes: sclerae anicteric,conjunctive pale  Ears: Normal auditory acuity  Mouth: No obvious Candida  Neck: Supple, no masses.  Lungs: Clear throughout to auscultation  Heart: Regular rate and rhythm  Abdomen: Limited exam, patient in a wheelchair Soft, nontender, non distended. Normal bowel sounds  Musculoskeletal: Symmetrical with no gross  deformities  Extremities: No edema  Neurological: Alert, grossly nonfocal  Cervical Nodes: No significant cervical adenopathy  Psychological: Alert and cooperative.     Labs on Admission:  Results for orders placed during the hospital encounter of 10/05/12 (from the past 48 hour(s))  CBC WITH DIFFERENTIAL     Status: Normal   Collection Time   10/05/12  8:36 PM      Component Value Range Comment   WBC 6.5  4.0 - 10.5 K/uL    RBC 4.28  3.87 - 5.11 MIL/uL    Hemoglobin 12.4  12.0 - 15.0 g/dL    HCT 40.9  81.1 - 91.4 %    MCV 88.1  78.0 - 100.0 fL    MCH 29.0  26.0 - 34.0 pg    MCHC 32.9  30.0 - 36.0 g/dL    RDW 78.2  95.6 - 21.3 %    Platelets 256  150 - 400 K/uL    Neutrophils Relative 59  43 - 77 %    Neutro Abs 3.9  1.7 - 7.7 K/uL    Lymphocytes Relative 31  12 - 46 %    Lymphs Abs 2.0  0.7 - 4.0 K/uL    Monocytes Relative 7  3 - 12 %    Monocytes Absolute 0.5  0.1 - 1.0 K/uL     Eosinophils Relative 2  0 - 5 %    Eosinophils Absolute 0.1  0.0 - 0.7 K/uL    Basophils Relative 1  0 - 1 %    Basophils Absolute 0.1  0.0 - 0.1 K/uL   COMPREHENSIVE METABOLIC PANEL     Status: Abnormal   Collection Time   10/05/12  8:36 PM      Component Value Range Comment   Sodium 139  135 - 145 mEq/L    Potassium 4.0  3.5 - 5.1 mEq/L    Chloride 104  96 - 112 mEq/L    CO2 23  19 - 32 mEq/L    Glucose, Bld 101 (*) 70 - 99 mg/dL    BUN 7  6 - 23 mg/dL    Creatinine, Ser 0.86  0.50 - 1.10 mg/dL    Calcium 9.5  8.4 - 57.8 mg/dL    Total Protein 6.7  6.0 - 8.3 g/dL    Albumin 3.6  3.5 - 5.2 g/dL    AST 18  0 - 37 U/L    ALT 10  0 - 35 U/L    Alkaline Phosphatase 59  39 - 117 U/L    Total Bilirubin 0.2 (*) 0.3 - 1.2 mg/dL    GFR calc non Af Amer 60 (*) >90 mL/min    GFR calc Af Amer 69 (*) >90 mL/min     Radiological Exams on Admission: No results found.  Assessment/Plan  1. Progressive dysphagia The patient admitted for further workup Will check barium swallow [facility] to see patient in a.m., to make decision regarding EGD Will also get speech therapy evaluation Gentle IV fluids, IV PPI I will keep her on liquids tonight since she tolerates it better I will hold her Coumadin, and put her on full dose Lovenox which can be stopped 12 hours before any planned procedure  2. MRSA  Axillary abscess: continue clindamycin, will change to IV, was supposed to take this for 10 days from discharge, start date 12/1., Wound care  3.  Dementia: Stable  4. history of DVT in 2004 and PE in 2011: We'll stop  Coumadin, start therapeutic Lovenox per pharmacy which will need to be held 12 hours before planned procedures   5. history of CAD: Not active issue at current time  DVT prophylaxis: On therapeutic anticoagulation  CODE STATUS discussed with son full Family communication: Discussed with son [name] [name] and bedside, telephone # [phone] Disposition inpatient   Time Spent  on Admission:  [facility] Pager: [phone] 10/06/2012, 4:44 PM

## 2012-10-06 NOTE — Telephone Encounter (Signed)
Pts son called and is very upset. Pt has been seen in the ER and has been in the hospital several times. They have complained to PCP and ER docs but state her problems swallowing have not been addressed. Pt scheduled to see Willette Cluster NP today at 2pm. Pt aware of appt date and time.

## 2012-10-06 NOTE — Progress Notes (Signed)
Reviewed, I have seen the pt. Will admit for hydration and diagnostic work up.

## 2012-10-06 NOTE — Progress Notes (Addendum)
ANTICOAGULATION CONSULT NOTE - Initial Consult  Pharmacy Consult for Lovenox (while Coumadin on hold) Indication: Hx of DVT (2004) and PE (2011)   Allergies  Allergen Reactions  . Codeine Phosphate Nausea And Vomiting  . Darvocet (Propoxyphene-Acetaminophen) Nausea And Vomiting  . Promethazine Hcl Nausea And Vomiting  . Sulfamethoxazole Other (See Comments)    Pt doesn't remember reaction  . Amoxicillin Nausea And Vomiting and Rash  . Penicillins Nausea And Vomiting and Rash  Patient Measurements: Weight 67.1 kg on 10/06/12 Height 157 cm on 10/06/12 Vital Signs: BP: 118/62 mmHg (12/06 1359) Pulse Rate: 64  (12/06 1359) Labs:  [facility] 10/05/12 2036  HGB 12.4  HCT 37.7  PLT 256  APTT --  LABPROT --  INR --  HEPARINUNFRC --  CREATININE 0.84  CKTOTAL --  CKMB --  TROPONINI --   The CrCl is unknown because both a height and weight (above a minimum accepted value) are required for this calculation.  Medical History: Past Medical History  Diagnosis Date  . CAD (coronary artery disease)     S/p PTCA / stenting (last cath 2004, multiple LAD stents, 2 stents in the right coronary artery all patent)  . 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 Started coumadin  . HTN (hypertension)   . Restless leg syndrome   . PMR (polymyalgia rheumatica)   . Axillary abscess   . Anxiety   . Dementia   . Cancer   . Complication of anesthesia   . PONV (postoperative nausea and vomiting)   . Shortness of breath   . GERD (gastroesophageal reflux disease)    Medications:  Scheduled:    . amLODipine  10 mg Oral q morning - 10a  . atorvastatin  10 mg Oral q1800  . clindamycin (CLEOCIN) IV  300 mg Intravenous Q8H  . donepezil  10 mg Oral q morning - 10a  . enoxaparin (LOVENOX) injection  30 mg Subcutaneous Q24H  . pantoprazole (PROTONIX) IV  40 mg Intravenous QHS  . rOPINIRole  0.5 mg Oral Custom  . rOPINIRole  1 mg Oral Q1200  . saccharomyces boulardii  250 mg Oral BID  .  [DISCONTINUED] rOPINIRole  0.5-1 mg Oral TID    Assessment: 89 YOF on chronic Coumadin for history of DVT in 2004 and PE in 2011 to start treatment dose Lovenox per pharmacy dosing while Coumadin is on hold for possible procedures with plans to hold Lovenox 12 hours prior to any needed procedures. Lovenox 30mg  SQ daily ordered- Called Dr. Jomarie Longs. She gave verbal orders to discontinue and I confirmed with RN that this had not been given.   INR on 10/01/12 was 1.20. Today it is 1.59.  SCr on 12/5 was 0.84. EstCrCl~67mL/min. CBC is wnl.   Goal of Therapy:  Anti-Xa level 0.6-1.2 units/ml 4hrs after LMWH dose given if appropriate Monitor platelets by anticoagulation protocol: Yes   Plan:  1. Lovenox 70mg  SQ q12h.  2. Follow-up renal function and CBC q72hrs.  3. Follow-up plans for procedures- note IM physicians plans to hold 12 hours prior to procedures.   [name] [name] 10/06/2012,5:53 PM

## 2012-10-06 NOTE — Consult Note (Signed)
[facility] Consultation  Referring Provider:  [facility]  Primary Care Physician:  [name], MD Primary Gastroenterologist:  Lina Sar, MD Reason for Consultation:     Dysphagia     10/06/2012  [name] [name]  [id number]  1921-06-29   HISTORY OF PRESENT ILLNESS:  Patient is an 78 year old female, new to this practice, referred for evaluation of dysphasia. Patient was formerly followed by Dr. Sabino Gasser for a history of colon polyps.  Patient has dementia, her son is here and provides the history. The son is an excellent historian and gives a several months history of intermittent solid food dysphagia. Patient was to be referred here for workup but other health issues arose. Patient was hospitalized late last month with bilateral axillary abscesses requiring incision and drainage. According to the son, patient's dysphasia progressed while hospitalized. She then began having problems swallowing liquids. appear She was in the hospital recently for bilateral axillary abscesses requiring surgery. During that admission, her dysphasia progressed. Patient was discharged home but according to the son was unable to swallow even liquids. In addition, she had developed constipation not having had a BM in several days. Patient was discharged home 10/01/12. The following day she developed lower abdominal pain, went back to the ED where she was given IV fluids, IV morphine and a soaps suds enema. Acute abdominal series was negative. Following the enema, patient had a large bowel movement and was discharged home. The patient's dysphasia was discussed in the ED . According to the ED note, patient was able to tolerate some oral fluids in the ED. Patient was taken back to the emergency department yesterday with swallowing difficulty. The emergency room physician spoke to our physician on call last night, patient was made an appointment with me today in the office. Patient has been unable  to swallow her medications, including Coumadin for 2 days. No odynophagia. Food, pills and even liquids feel like they're getting stuck in her throat. Son reports a 5 pound weight loss over the last several days.   Past Medical History   Diagnosis  Date   .  CAD (coronary artery disease)      S/p PTCA / stenting (last cath 2004, multiple LAD stents, 2 stents in the right coronary artery all patent)   .  VTE (venous thromboembolism)  10/2010     DVT and PE. Started coumadin   .  HTN (hypertension)    .  Restless leg syndrome    .  PMR (polymyalgia rheumatica)    .  Axillary abscess    .  Anxiety    .  Dementia    .  Cancer     Past Surgical History   Procedure  Date   .  Colectomy    .  Breast lumpectomy    .  Stent      cardiac x 8 stents.   .  Incision and drainage      bilateral axillary, non specific staff   .  Incision and drainage abscess  09/28/2012     Procedure: INCISION AND DRAINAGE ABSCESS; Surgeon: Liz Malady, MD; Location: [facility] OR; Service: General; Laterality: Bilateral;    reports that she has never smoked. She has never used smokeless tobacco. She reports that she does not drink alcohol or use illicit drugs.  family history is not on file.  Allergies   Allergen  Reactions   .  Codeine Phosphate  Nausea And Vomiting   .  Darvocet (Propoxyphene-Acetaminophen)  Nausea And Vomiting   .  Promethazine Hcl  Nausea And Vomiting   .  Sulfamethoxazole  Other (See Comments)     Pt doesn't remember reaction   .  Amoxicillin  Nausea And Vomiting and Rash   .  Penicillins  Nausea And Vomiting and Rash    Outpatient Encounter Prescriptions as of 10/06/2012   Medication  Sig  Dispense  Refill   .  acetaminophen (TYLENOL) 500 MG tablet  Take 500 mg by mouth every 6 (six) hours as needed. For pain     .  amLODipine (NORVASC) 10 MG tablet  Take 10 mg by mouth every morning.     Marland Kitchen  atorvastatin (LIPITOR) 20 MG tablet  Take 10 mg by mouth every evening.     .  cholecalciferol  (VITAMIN D) 1000 UNITS tablet  Take 2,000 Units by mouth daily.     .  clindamycin (CLEOCIN) 300 MG capsule  Take 1 capsule (300 mg total) by mouth 3 (three) times daily.  30 capsule  0   .  donepezil (ARICEPT) 10 MG tablet  Take 10 mg by mouth every morning.     .  ferrous sulfate 325 (65 FE) MG EC tablet  Take 325 mg by mouth daily with breakfast.     .  LORazepam (ATIVAN) 0.5 MG tablet  Take 0.25 mg by mouth 3 (three) times daily as needed. for anxiety     .  losartan (COZAAR) 50 MG tablet  Take 50 mg by mouth at bedtime.     .  mirabegron ER (MYRBETRIQ) 25 MG TB24  Take 25 mg by mouth every evening.     Marland Kitchen  NEXIUM 40 MG capsule  Take 40 mg by mouth daily before breakfast.     .  nitroGLYCERIN (NITROSTAT) 0.4 MG SL tablet  Place 0.4 mg under the tongue every 5 (five) minutes x 3 doses as needed. For chest pain     .  Pediatric Multiple Vit-C-FA (CHILDRENS CHEWABLE MULTI VITS PO)  Take 1 tablet by mouth daily.     .  potassium chloride SA (K-DUR,KLOR-CON) 20 MEQ tablet  Take 20 mEq by mouth daily.     .  predniSONE (DELTASONE) 1 MG tablet  Take 2 mg by mouth every morning.     Marland Kitchen  rOPINIRole (REQUIP) 1 MG tablet  Take 0.5-1 mg by mouth 3 (three) times daily. 1 tab at noon, 1/2 tab late afternoon, 1/2 tab one-three hours before bed     .  traMADol (ULTRAM) 50 MG tablet  Take 25 mg by mouth every 6 (six) hours as needed. For pain     .  warfarin (COUMADIN) 2.5 MG tablet  Take 2.5 mg by mouth every evening.     .  zaleplon (SONATA) 10 MG capsule  Take 1 capsule (10 mg total) by mouth at bedtime. Facility MD to refill  10 capsule  0   .  [DISCONTINUED] benzonatate (TESSALON) 100 MG capsule  Take 100 mg by mouth 3 (three) times daily as needed. For cough     .  [DISCONTINUED] cholestyramine light (PREVALITE) 4 G packet  Take 4 g by mouth daily as needed. For loose stools      Facility-Administered Encounter Medications as of 10/06/2012   Medication  Dose  Route  Frequency  Provider  Last Rate  Last  Dose   .  [COMPLETED] glucagon (GLUCAGEN) injection 1 mg  1 mg  Intravenous  Once  Benny Lennert, MD   1 mg at 10/05/12 2109   .  [COMPLETED] sodium chloride 0.9 % bolus 250 mL  250 mL  Intravenous  Once  Benny Lennert, MD   250 mL at 10/05/12 2102    REVIEW OF SYSTEMS : Unreliable, patient has dementia. Her son provides history   PHYSICAL EXAM:  BP 118/62  Pulse 64  Ht 5\' 2"  (1.575 m)  Wt 148 lb (67.132 kg)  BMI 27.07 kg/m2  General: Pleasant, white female in a wheelchair in no acute distress  Head: Normocephalic and atraumatic  Eyes: sclerae anicteric,conjunctive pale  Ears: Normal auditory acuity  Mouth: No obvious candida  Neck: Supple, no masses.  Lungs: Clear throughout to auscultation  Heart: Regular rate and rhythm  Abdomen: Limited exam, patient in a wheelchair Soft, nontender, non distended. Normal bowel sounds  Musculoskeletal: Symmetrical with no gross deformities  Extremities: No edema  Neurological: Alert, grossly nonfocal  Cervical Nodes: No significant cervical adenopathy  Psychological: Alert and cooperative.   ASSESSMENT AND PLAN:  1. several month history of solid food dysphagia, progressive over the last several days. Now having difficulty swallowing liquids. Patient has not taken her home medications, including Coumadin, in 2 days. Son concerned about patient's progressive weakness and risk for dehydration secondary to decreased oral intake. Patient should be admitted for further workup and evaluation. I spoke with [facility], Dr. Lavera Guise who was kind enough to admit the patient. He will order a barium swallow today for further evaluation. Further recommendations will be made pending barium swallow results. Ideally, patient could remain off Coumadin in case EGD is warranted Perhaps a shorter acting anticoagulant could be used .   2. History of tubulovillous adenomatous colon polyps previously followed by Dr. Virginia Rochester. It appears her last colonoscopy was February  2009 with findings of multiple polyps involving the hepatic flexure, cecum, and transverse colon. Pathology was compatible with tubular adenomas and tubulovillous adenomatous polyps.   3. CAD, history of cardiac stents. Patient was on Plavix but apparently changed to Coumadin a year ago.   4. recent history of bilateral axillary abscesses, status post incision and drainage. She is on Cleocin.   5. dementia, on Aricept.   6. ? History of colectomy (remote) per son. I don't see any documentation of this and no mention of this in colonoscopy reports  I have seen the pt and spoke with her son. Gradual onset predominantly solid food dysphagia suggestive of a fixed obstruction. Since she is on Coumadin, will order Ba esophagram first then decide if EGD warranted.

## 2012-10-06 NOTE — Progress Notes (Signed)
10/06/2012 [name] [name] [id number] 11-Nov-1920   HISTORY OF PRESENT ILLNESS:  Patient is an 78 year old female, new to this practice, referred for evaluation of dysphasia. Patient was formerly followed by Dr. Sabino Gasser for a history of colon polyps.  Patient has dementia, her son is here and provides the history. The son is an excellent historian and gives a several months history of intermittent solid food dysphagia. Patient was to be referred here for workup but other health issues arose. Patient was hospitalized late last month with bilateral axillary abscesses requiring incision and drainage. According to the son, patient's dysphasia progressed while hospitalized. She then began having problems swallowing liquids. appear She was in the hospital recently for bilateral axillary abscesses requiring surgery. During that admission, her dysphasia progressed. Patient was discharged home but according to the son was unable to swallow even liquids. In addition, she had developed constipation not having had a BM in several days. Patient was discharged home 10/01/12. The following day she developed lower abdominal pain, went back to the ED where she was given IV fluids, IV morphine and a soaps suds enema. Acute abdominal series was negative. Following the enema, patient had a large bowel movement and was discharged home. The patient's dysphasia was discussed in the ED . According to the ED note, patient was able to tolerate some oral fluids in the ED. Patient was taken back to the emergency department yesterday with swallowing difficulty.   The emergency room physician spoke to our physician on call last night, patient was made an appointment with me today in the office.  Patient has been unable to swallow her medications, including Coumadin for 2 days. No odynophagia. Food, pills and even liquids feel like they're getting stuck in her throat. Son reports a 5 pound weight loss over the last several days.                                   Past Medical History  Diagnosis Date  . CAD (coronary artery disease)     S/p PTCA / stenting (last cath 2004, multiple LAD stents, 2 stents in the right coronary artery all patent)  . 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 Started coumadin  . HTN (hypertension)   . Restless leg syndrome   . PMR (polymyalgia rheumatica)   . Axillary abscess   . Anxiety   . Dementia   . Cancer    Past Surgical History  Procedure Date  . Colectomy   . Breast lumpectomy   . Stent     cardiac x 8 stents.  . Incision and drainage     bilateral axillary, non specific staff  . Incision and drainage abscess 09/28/2012    Procedure: INCISION AND DRAINAGE ABSCESS;  Surgeon: Liz Malady, MD;  Location: [facility] OR;  Service: General;  Laterality: Bilateral;    reports that she has never smoked. She has never used smokeless tobacco. She reports that she does not drink alcohol or use illicit drugs. family history is not on file. Allergies  Allergen Reactions  . Codeine Phosphate Nausea And Vomiting  . Darvocet (Propoxyphene-Acetaminophen) Nausea And Vomiting  . Promethazine Hcl Nausea And Vomiting  . Sulfamethoxazole Other (See Comments)    Pt doesn't remember reaction  . Amoxicillin Nausea And Vomiting and Rash  . Penicillins Nausea And Vomiting and Rash      Outpatient Encounter Prescriptions as of  10/06/2012  Medication Sig Dispense Refill  . acetaminophen (TYLENOL) 500 MG tablet Take 500 mg by mouth every 6 (six) hours as needed. For pain      . amLODipine (NORVASC) 10 MG tablet Take 10 mg by mouth every morning.       Marland Kitchen atorvastatin (LIPITOR) 20 MG tablet Take 10 mg by mouth every evening.       . cholecalciferol (VITAMIN D) 1000 UNITS tablet Take 2,000 Units by mouth daily.      . clindamycin (CLEOCIN) 300 MG capsule Take 1 capsule (300 mg total) by mouth 3 (three) times daily.  30 capsule  0  . donepezil (ARICEPT) 10 MG tablet Take 10 mg by mouth  every morning.      . ferrous sulfate 325 (65 FE) MG EC tablet Take 325 mg by mouth daily with breakfast.      . LORazepam (ATIVAN) 0.5 MG tablet Take 0.25 mg by mouth 3 (three) times daily as needed. for anxiety      . losartan (COZAAR) 50 MG tablet Take 50 mg by mouth at bedtime.       . mirabegron ER (MYRBETRIQ) 25 MG TB24 Take 25 mg by mouth every evening.       Marland Kitchen NEXIUM 40 MG capsule Take 40 mg by mouth daily before breakfast.       . nitroGLYCERIN (NITROSTAT) 0.4 MG SL tablet Place 0.4 mg under the tongue every 5 (five) minutes x 3 doses as needed. For chest pain      . Pediatric Multiple Vit-C-FA (CHILDRENS CHEWABLE MULTI VITS PO) Take 1 tablet by mouth daily.      . potassium chloride SA (K-DUR,KLOR-CON) 20 MEQ tablet Take 20 mEq by mouth daily.      . predniSONE (DELTASONE) 1 MG tablet Take 2 mg by mouth every morning.       Marland Kitchen rOPINIRole (REQUIP) 1 MG tablet Take 0.5-1 mg by mouth 3 (three) times daily. 1 tab at noon, 1/2 tab late afternoon, 1/2 tab one-three hours before bed      . traMADol (ULTRAM) 50 MG tablet Take 25 mg by mouth every 6 (six) hours as needed. For pain      . warfarin (COUMADIN) 2.5 MG tablet Take 2.5 mg by mouth every evening.       . zaleplon (SONATA) 10 MG capsule Take 1 capsule (10 mg total) by mouth at bedtime. Facility MD to refill  10 capsule  0  . [DISCONTINUED] benzonatate (TESSALON) 100 MG capsule Take 100 mg by mouth 3 (three) times daily as needed. For cough      . [DISCONTINUED] cholestyramine light (PREVALITE) 4 G packet Take 4 g by mouth daily as needed. For loose stools       Facility-Administered Encounter Medications as of 10/06/2012  Medication Dose Route Frequency Provider Last Rate Last Dose  . [COMPLETED] glucagon (GLUCAGEN) injection 1 mg  1 mg Intravenous Once Benny Lennert, MD   1 mg at 10/05/12 2109  . [COMPLETED] sodium chloride 0.9 % bolus 250 mL  250 mL Intravenous Once Benny Lennert, MD   250 mL at 10/05/12 2102     REVIEW OF  SYSTEMS  : Unreliable, patient has dementia. Her son provides history   PHYSICAL EXAM: BP 118/62  Pulse 64  Ht 5\' 2"  (1.575 m)  Wt 148 lb (67.132 kg)  BMI 27.07 kg/m2 General: Pleasant, white female in a wheelchair in no acute distress Head: Normocephalic and atraumatic Eyes:  sclerae anicteric,conjunctive pale Ears: Normal auditory acuity Mouth: No obvious Candida Neck: Supple, no masses.  Lungs: Clear throughout to auscultation Heart: Regular rate and rhythm Abdomen: Limited exam, patient in a wheelchair Soft, nontender, non distended. Normal bowel sounds Musculoskeletal: Symmetrical with no gross deformities  Extremities: No edema  Neurological: Alert, grossly nonfocal Cervical Nodes:  No significant cervical adenopathy Psychological:  Alert and cooperative.   ASSESSMENT AND PLAN:   1. several month history of solid food dysphagia, progressive over the last several days. Now having difficulty swallowing liquids. Patient has not taken her home medications, including Coumadin, in 2 days. Son concerned about patient's progressive weakness and risk for dehydration secondary to decreased oral intake. Patient should be admitted for further workup and evaluation. I spoke with [facility] hospital[facility], Dr. Lavera Guise who was kind enough to admit the patient. He will order a barium swallow today for further evaluation. Further recommendations will be made pending barium swallow results. Ideally, patient could remain off Coumadin in case EGD is warranted  Perhaps a shorter acting anticoagulant could be used .  2. History of tubulovillous adenomatous colon polyps previously followed by Dr. Virginia Rochester. It appears her last colonoscopy was February 2009 with findings of multiple polyps involving the hepatic flexure, cecum, and transverse colon. Pathology was compatible with tubular adenomas and tubulovillous adenomatous polyps.  3. CAD, history of cardiac stents. Patient was on Plavix but apparently changed to  Coumadin a year ago.  4. recent history of bilateral axillary abscesses, status post incision and drainage. She is on Cleocin.  5. dementia, on Aricept.  6. ? History of colectomy (remote) per son. I don't see any documentation of this and no mention of this in colonoscopy reports.

## 2012-10-06 NOTE — Telephone Encounter (Signed)
Somebody with swallowing problems. Not known to me. See mid-level

## 2012-10-07 DIAGNOSIS — I10 Essential (primary) hypertension: Secondary | ICD-10-CM

## 2012-10-07 DIAGNOSIS — IMO0002 Reserved for concepts with insufficient information to code with codable children: Secondary | ICD-10-CM

## 2012-10-07 DIAGNOSIS — I2510 Atherosclerotic heart disease of native coronary artery without angina pectoris: Secondary | ICD-10-CM

## 2012-10-07 LAB — CBC
HCT: 34.9 % — ABNORMAL LOW (ref 36.0–46.0)
Hemoglobin: 11.1 g/dL — ABNORMAL LOW (ref 12.0–15.0)
MCV: 87.5 fL (ref 78.0–100.0)
RBC: 3.99 MIL/uL (ref 3.87–5.11)
RDW: 14.2 % (ref 11.5–15.5)
WBC: 6 10*3/uL (ref 4.0–10.5)

## 2012-10-07 LAB — COMPREHENSIVE METABOLIC PANEL
BUN: 7 mg/dL (ref 6–23)
CO2: 21 mEq/L (ref 19–32)
Chloride: 104 mEq/L (ref 96–112)
Creatinine, Ser: 0.93 mg/dL (ref 0.50–1.10)
GFR calc Af Amer: 61 mL/min — ABNORMAL LOW (ref >90)
GFR calc non Af Amer: 53 mL/min — ABNORMAL LOW (ref >90)
Glucose, Bld: 83 mg/dL (ref 70–99)
Total Bilirubin: 0.3 mg/dL (ref 0.3–1.2)

## 2012-10-07 LAB — PROTIME-INR
INR: 1.81 — ABNORMAL HIGH (ref 0.00–1.49)
Prothrombin Time: 20.3 seconds — ABNORMAL HIGH (ref 11.6–15.2)

## 2012-10-07 MED ORDER — PREDNISONE 1 MG PO TABS
2.0000 mg | ORAL_TABLET | Freq: Every morning | ORAL | Status: DC
  Administered 2012-10-07 – 2012-10-11 (×5): 2 mg via ORAL
  Filled 2012-10-07 (×5): qty 2

## 2012-10-07 MED ORDER — CHLORHEXIDINE GLUCONATE 0.12 % MT SOLN
15.0000 mL | Freq: Two times a day (BID) | OROMUCOSAL | Status: DC
  Administered 2012-10-07 – 2012-10-11 (×9): 15 mL via OROMUCOSAL
  Filled 2012-10-07 (×14): qty 15

## 2012-10-07 MED ORDER — BIOTENE DRY MOUTH MT LIQD
15.0000 mL | Freq: Two times a day (BID) | OROMUCOSAL | Status: DC
  Administered 2012-10-07 – 2012-10-10 (×6): 15 mL via OROMUCOSAL

## 2012-10-07 NOTE — Progress Notes (Addendum)
PATIENT DETAILS Name: [name] [name] Age: 78 y.o. Sex: female Date of Birth: 12/30/1920 Admit Date: 10/06/2012 Admitting Physician Zannie Cove, MD ZOX:[id number] DAVIDSON, MD  Subjective: Admitted with dysphagia to solids  Assessment/Plan: Active Problems: Dysphagia -mostly to solids -await Barium esophagogram -GI following  MRSA Axillary abscess -recent I&D to b/l axilla by [facility] -was discharged on Clindamycin-will continue with IV Clindamycin as NPO  VTE -on Lovenox-therapeutic -coumadin on hold -monitor INR  history of CAD -stable-Not active issue at current time  HTN -controlled with Amlodipine  PMR  -on chronic steroids-resume  Chronic Diarrhea  -at baseline  Restless Leg Syndrome  -c/w Requip   Dementia -stable -c/w Aricept  Dyslipidemia -c/w Statins  isposition: Remain inpatient  DVT Prophylaxis: On therapeutic Lovenox   Code Status: Full code   Procedures:  None  CONSULTS:  GI  PHYSICAL EXAM: Vital signs in last 24 hours: Filed Vitals:   10/06/12 1832 10/06/12 2220 10/07/12 0614 10/07/12 1007  BP:  139/62 137/81 148/60  Pulse:  80 84   Temp:  98 F (36.7 C) 98.1 F (36.7 C)   TempSrc:   Oral   Resp:  18 20   Height: 5\' 2"  (1.575 m)     Weight: 67.5 kg (148 lb 13 oz)     SpO2:  96% 98%     Weight change:  Body mass index is 27.22 kg/(m^2).   Gen Exam: Awake and alert with clear speech.   Neck: Supple, No JVD.   Chest: B/L Clear.   CVS: S1 S2 Regular, no murmurs.  Abdomen: soft, BS +, non tender, non distended.  Extremities: no edema, lower extremities warm to touch.B/L axillary area-no abscess-dressing dry Neurologic: Non Focal.   Skin: No Rash.   Wounds: N/A.   Intake/Output from previous day:  Intake/Output Summary (Last 24 hours) at 10/07/12 1141 Last data filed at 10/07/12 0900  Gross per 24 hour  Intake 1424.5 ml  Output    600 ml  Net  824.5 ml     LAB RESULTS: CBC  Lab 10/07/12 0555 10/06/12  1805 10/05/12 2036 10/03/12 0055  WBC 6.0 6.7 6.5 10.5  HGB 11.1* 12.9 12.4 12.8  HCT 34.9* 39.5 37.7 38.9  PLT 253 254 256 258  MCV 87.5 86.6 88.1 87.6  MCH 27.8 28.3 29.0 28.8  MCHC 31.8 32.7 32.9 32.9  RDW 14.2 14.0 13.9 13.8  LYMPHSABS -- -- 2.0 2.1  MONOABS -- -- 0.5 0.7  EOSABS -- -- 0.1 0.2  BASOSABS -- -- 0.1 0.0  BANDABS -- -- -- --    Chemistries   Lab 10/07/12 0555 10/06/12 1805 10/05/12 2036 10/03/12 0055  NA 137 -- 139 135  K 3.6 -- 4.0 4.2  CL 104 -- 104 100  CO2 21 -- 23 24  GLUCOSE 83 -- 101* 110*  BUN 7 -- 7 11  CREATININE 0.93 0.88 0.84 0.86  CALCIUM 8.4 -- 9.5 9.4  MG -- -- -- --    CBG: No results found for this basename: GLUCAP:5 in the last 168 hours  GFR Estimated Creatinine Clearance: 37 ml/min (by C-G formula based on Cr of 0.93).  Coagulation profile  Lab 10/07/12 0555 10/06/12 1805 10/01/12 0647  INR 1.81* 1.59* 1.20  PROTIME -- -- --    Cardiac Enzymes No results found for this basename: CK:3,CKMB:3,TROPONINI:3,MYOGLOBIN:3 in the last 168 hours  No components found with this basename: POCBNP:3 No results found for this basename: DDIMER:2 in the last 72 hours No  results found for this basename: HGBA1C:2 in the last 72 hours No results found for this basename: CHOL:2,HDL:2,LDLCALC:2,TRIG:2,CHOLHDL:2,LDLDIRECT:2 in the last 72 hours No results found for this basename: TSH,T4TOTAL,FREET3,T3FREE,THYROIDAB in the last 72 hours No results found for this basename: VITAMINB12:2,FOLATE:2,FERRITIN:2,TIBC:2,IRON:2,RETICCTPCT:2 in the last 72 hours No results found for this basename: LIPASE:2,AMYLASE:2 in the last 72 hours  Urine Studies No results found for this basename: UACOL:2,UAPR:2,USPG:2,UPH:2,UTP:2,UGL:2,UKET:2,UBIL:2,UHGB:2,UNIT:2,UROB:2,ULEU:2,UEPI:2,UWBC:2,URBC:2,UBAC:2,CAST:2,CRYS:2,UCOM:2,BILUA:2 in the last 72 hours  MICROBIOLOGY: Recent Results (from the past 240 hour(s))  MRSA PCR SCREENING     Status: Abnormal   Collection  Time   09/27/12  7:42 PM      Component Value Range Status Comment   MRSA by PCR POSITIVE (*) NEGATIVE Final   SURGICAL PCR SCREEN     Status: Abnormal   Collection Time   09/28/12  3:09 AM      Component Value Range Status Comment   MRSA, PCR POSITIVE (*) NEGATIVE Final    Staphylococcus aureus POSITIVE (*) NEGATIVE Final   CULTURE, ROUTINE-ABSCESS     Status: Normal   Collection Time   09/28/12  3:53 PM      Component Value Range Status Comment   Specimen Description ABSCESS AXILLA LEFT   Final    Special Requests NONE   Final    Gram Stain     Final    Value: NO WBC SEEN     NO SQUAMOUS EPITHELIAL CELLS SEEN     FEW GRAM POSITIVE COCCI IN CLUSTERS     IN PAIRS   Culture     Final    Value: MODERATE METHICILLIN RESISTANT STAPHYLOCOCCUS AUREUS     Note: RIFAMPIN AND GENTAMICIN SHOULD NOT BE USED AS SINGLE DRUGS FOR TREATMENT OF STAPH INFECTIONS. This organism DOES NOT demonstrate inducible Clindamycin resistance in vitro. CRITICAL RESULT CALLED TO, READ BACK BY AND VERIFIED WITH: ROBIN Z 12/01 @      830 BY REAMM   Report Status 10/01/2012 FINAL   Final    Organism ID, Bacteria METHICILLIN RESISTANT STAPHYLOCOCCUS AUREUS   Final   URINE CULTURE     Status: Normal   Collection Time   09/29/12  4:52 PM      Component Value Range Status Comment   Specimen Description URINE, CLEAN CATCH   Final    Special Requests NONE   Final    Culture  Setup Time 09/29/2012 17:35   Final    Colony Count NO GROWTH   Final    Culture NO GROWTH   Final    Report Status 09/30/2012 FINAL   Final     RADIOLOGY STUDIES/RESULTS: Dg Abd Acute W/chest  10/03/2012  *RADIOLOGY REPORT*  Clinical Data: Abdominal pain and distention.  Constipation. Anorexia.  ACUTE ABDOMEN SERIES (ABDOMEN 2 VIEW & CHEST 1 VIEW)  Comparison: Chest radiograph on 08/24/2012  Findings: Scattered bowel air fluid levels are seen within both small bowel and colon.  No evidence of dilated bowel loops.  No evidence of free  intraperitoneal air.  No radiopaque calculi identified.  Elevation of right hemidiaphragm is stable.  Mild bibasilar scarring also unchanged.  No evidence of acute infiltrate or edema. No evidence of pleural effusion.  Heart size is within normal limits.  No mass or lymphadenopathy identified.  IMPRESSION:  1.  Nonspecific, nonobstructive bowel gas pattern. 2.  No active cardiopulmonary disease.   Original Report Authenticated By: Myles Rosenthal, M.D.     MEDICATIONS: Scheduled Meds:   . amLODipine  10 mg Oral q  morning - 10a  . antiseptic oral rinse  15 mL Mouth Rinse q12n4p  . atorvastatin  10 mg Oral q1800  . chlorhexidine  15 mL Mouth Rinse BID  . clindamycin (CLEOCIN) IV  300 mg Intravenous Q8H  . donepezil  10 mg Oral q morning - 10a  . enoxaparin (LOVENOX) injection  70 mg Subcutaneous Q12H  . pantoprazole (PROTONIX) IV  40 mg Intravenous QHS  . rOPINIRole  0.5 mg Oral Custom  . rOPINIRole  1 mg Oral Q1200  . saccharomyces boulardii  250 mg Oral BID  . [DISCONTINUED] enoxaparin (LOVENOX) injection  30 mg Subcutaneous Q24H  . [DISCONTINUED] rOPINIRole  0.5-1 mg Oral TID   Continuous Infusions:   . sodium chloride 75 mL/hr at 10/06/12 2046   PRN Meds:.acetaminophen, LORazepam, nitroGLYCERIN, traMADol, zolpidem  Antibiotics: Anti-infectives     Start     Dose/Rate Route Frequency Ordered Stop   10/06/12 1800   clindamycin (CLEOCIN) IVPB 300 mg        300 mg 100 mL/hr over 30 Minutes Intravenous Every 8 hours 10/06/12 Deanna Artis, MD  [facility] Pager:[phone]  If 7PM-7AM, please contact night-coverage [email or web address] Password [email or web address] 10/07/2012, 11:41 AM   LOS: 1 day

## 2012-10-07 NOTE — Plan of Care (Signed)
Problem: Phase I Progression Outcomes Goal: Initial discharge plan identified Outcome: Completed/Met Date Met:  10/07/12 To return home with son

## 2012-10-07 NOTE — Evaluation (Signed)
Clinical/Bedside Swallow Evaluation Patient Details  Name: [name] [name] MRN: [id number] Date of Birth: 13-Apr-1921  Today's Date: 10/07/2012 Time: 1145-1200 SLP Time Calculation (min): 15 min  Past Medical History:  Past Medical History  Diagnosis Date  . CAD (coronary artery disease)     S/p PTCA / stenting (last cath 2004, multiple LAD stents, 2 stents in the right coronary artery all patent)  . 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 Started coumadin  . HTN (hypertension)   . Restless leg syndrome   . PMR (polymyalgia rheumatica)   . Axillary abscess   . Anxiety   . Dementia   . Cancer   . Complication of anesthesia   . PONV (postoperative nausea and vomiting)   . Shortness of breath   . GERD (gastroesophageal reflux disease)    Past Surgical History:  Past Surgical History  Procedure Date  . Colectomy   . Breast lumpectomy   . Stent     cardiac x 8 stents.  . Incision and drainage     bilateral axillary, non specific staff  . Incision and drainage abscess 09/28/2012    Procedure: INCISION AND DRAINAGE ABSCESS;  Surgeon: Liz Malady, MD;  Location: [facility] OR;  Service: General;  Laterality: Bilateral;   HPI:  Patient is an 78 year old female with h/o dementia whose son reports a several months history of intermittent solid food dysphagia. Patient was hospitalized late last month with bilateral axillary abscesses requiring incision and drainage. According to the son, patient's dysphasia progressed while hospitalized. She then began having problems swallowing liquids.  Patient was discharged home but according to the son was unable to swallow even liquids. In addition, she had developed constipation not having had a BM in several days. Patient was discharged home 10/01/12. The following day she developed lower abdominal pain, went back to the ED where she was given IV fluids, IV morphine and a soaps suds enema. Acute abdominal series was negative. Following the  enema, patient had a large bowel movement and was discharged home. The patient's dysphasia was discussed in the ED . According to the ED note, patient was able to tolerate some oral fluids in the ED. Patient was taken back to the emergency department 12/6 with swallowing difficulty. Patient has been unable to swallow her medications, including Coumadin for 2 days. No odynophagia. Food, pills and even liquids feel like they're getting stuck in her throat. Son reports a 5 pound weight loss over the last several days.    Assessment / Plan / Recommendation Clinical Impression  Patient presents with bedside clinical indication of a primary esophageal dysphagia characterized by globus and consistant belching post swallow despite what appears to be a functional oropharyngeal swallow. Solids NT given current dietary restrictions. Provided patient with general esophageal precautions. Sign placed over head of bed. Current diet appears appropriate at this time. SLP will f/u after barium swallow for results and to determine need for further intervention.  (? mild thrush which may also be impacting function. will f/u)    Aspiration Risk  Mild    Diet Recommendation Thin liquid (clear liquids)   Liquid Administration via: Cup;Straw Medication Administration: Whole meds with liquid (or crushed if needed) Supervision: Patient able to self feed;Intermittent supervision to cue for compensatory strategies Compensations: Slow rate;Small sips/bites Postural Changes and/or Swallow Maneuvers: Seated upright 90 degrees;Upright 30-60 min after meal    Other  Recommendations Oral Care Recommendations: Oral care BID   Follow Up Recommendations  Other (comment) (TBD)    Frequency and Duration min 2x/week  1 week   Pertinent Vitals/Pain n/a    SLP Swallow Goals Patient will utilize recommended strategies during swallow to increase swallowing safety with: Supervision/safety   Swallow Study    General HPI: Patient  is an 78 year old female with h/o dementia whose son reports a several months history of intermittent solid food dysphagia. Patient was hospitalized late last month with bilateral axillary abscesses requiring incision and drainage. According to the son, patient's dysphasia progressed while hospitalized. She then began having problems swallowing liquids.  Patient was discharged home but according to the son was unable to swallow even liquids. In addition, she had developed constipation not having had a BM in several days. Patient was discharged home 10/01/12. The following day she developed lower abdominal pain, went back to the ED where she was given IV fluids, IV morphine and a soaps suds enema. Acute abdominal series was negative. Following the enema, patient had a large bowel movement and was discharged home. The patient's dysphasia was discussed in the ED . According to the ED note, patient was able to tolerate some oral fluids in the ED. Patient was taken back to the emergency department 12/6 with swallowing difficulty. Patient has been unable to swallow her medications, including Coumadin for 2 days. No odynophagia. Food, pills and even liquids feel like they're getting stuck in her throat. Son reports a 5 pound weight loss over the last several days.  Type of Study: Bedside swallow evaluation Previous Swallow Assessment: none Diet Prior to this Study: Thin liquids (clear liquids) Temperature Spikes Noted: No Respiratory Status: Room air History of Recent Intubation: No Behavior/Cognition: Alert;Cooperative;Pleasant mood Oral Cavity - Dentition: Adequate natural dentition Self-Feeding Abilities: Able to feed self Patient Positioning: Upright in bed Baseline Vocal Quality: Clear Volitional Cough: Strong Volitional Swallow: Able to elicit    Oral/Motor/Sensory Function Overall Oral Motor/Sensory Function: Appears within functional limits for tasks assessed (mild white coating noted on tongue; ?  thrush)   Ice Chips Ice chips: Not tested   Thin Liquid Thin Liquid: Impaired Presentation: Self Fed;Straw Other Comments: c/o globus, belching post swallow    Nectar Thick Nectar Thick Liquid: Not tested   Honey Thick Honey Thick Liquid: Not tested   Puree Puree: Not tested   Solid   GO   Marykay Mccleod MA, CCC-SLP [phone]  Solid: Not tested       Rossie Scarfone Meryl 10/07/2012,12:11 PM

## 2012-10-07 NOTE — Progress Notes (Signed)
SLP Cancellation Note  Patient Details Name: [name] [name] MRN: [id number] DOB: May 27, 1921   Cancelled treatment:       Reason Eval/Treat Not Completed: Patient at procedure or test/unavailable (NPO for barium swallow. Will f/u after barium swallow. )   Kinzly Pierrelouis Meryl 10/07/2012, 10:59 AM

## 2012-10-08 ENCOUNTER — Inpatient Hospital Stay (HOSPITAL_COMMUNITY): Payer: Medicare Other

## 2012-10-08 DIAGNOSIS — R1310 Dysphagia, unspecified: Secondary | ICD-10-CM

## 2012-10-08 LAB — CBC
Hemoglobin: 11.8 g/dL — ABNORMAL LOW (ref 12.0–15.0)
MCH: 29.6 pg (ref 26.0–34.0)
MCHC: 33.3 g/dL (ref 30.0–36.0)
Platelets: 247 10*3/uL (ref 150–400)
RDW: 14.3 % (ref 11.5–15.5)

## 2012-10-08 LAB — PROTIME-INR: Prothrombin Time: 22.2 seconds — ABNORMAL HIGH (ref 11.6–15.2)

## 2012-10-08 MED ORDER — NYSTATIN 100000 UNIT/GM EX CREA
TOPICAL_CREAM | Freq: Two times a day (BID) | CUTANEOUS | Status: DC
  Administered 2012-10-08 – 2012-10-11 (×7): via TOPICAL
  Filled 2012-10-08: qty 15

## 2012-10-08 MED ORDER — FLUCONAZOLE 100 MG PO TABS
100.0000 mg | ORAL_TABLET | Freq: Every day | ORAL | Status: AC
  Administered 2012-10-08 – 2012-10-09 (×2): 100 mg via ORAL
  Filled 2012-10-08 (×3): qty 1

## 2012-10-08 NOTE — Progress Notes (Signed)
PATIENT DETAILS Name: [name] [name] Age: 78 y.o. Sex: female Date of Birth: [birth year] Admit Date: 10/06/2012 Admitting Physician Zannie Cove, MD UJW:JXBJY,NWGNFA DAVIDSON, MD  Subjective: Admitted with dysphagia to solids  Assessment/Plan: Active Problems: Dysphagia -mostly to solids -await Barium esophagogram -GI following-d/w Dr Leone Payor today  MRSA Axillary abscess -recent I&D to b/l axilla by [facility] -was discharged on Clindamycin-will continue with IV Clindamycin for now  VTE -on Lovenox-therapeutic -coumadin on hold -monitor INR in am  history of CAD -stable-Not active issue at current time  HTN -controlled with Amlodipine  PMR  -on chronic steroids-resume  Chronic Diarrhea  -at baseline  Restless Leg Syndrome  -c/w Requip   Dementia -stable -c/w Aricept  Dyslipidemia -c/w Statins  Disposition: Remain inpatient  DVT Prophylaxis: On therapeutic Lovenox   Code Status: Full code   Procedures:  None  CONSULTS:  GI  PHYSICAL EXAM: Vital signs in last 24 hours: Filed Vitals:   10/07/12 1007 10/07/12 1324 10/07/12 2128 10/08/12 0557  BP: 148/60 126/57 155/79 141/52  Pulse:  52 55 61  Temp:  97.2 F (36.2 C) 98 F (36.7 C) 98 F (36.7 C)  TempSrc:  Oral Oral Oral  Resp:  20 18 20   Height:      Weight:      SpO2:  97% 96% 98%    Weight change:  Body mass index is 27.22 kg/(m^2).   Gen Exam: Awake and alert with clear speech.   Neck: Supple, No JVD.   Chest: B/L Clear.   CVS: S1 S2 Regular, no murmurs.  Abdomen: soft, BS +, non tender, non distended.  Extremities: no edema, lower extremities warm to touch.B/L axillary area-no abscess-dressing dry Neurologic: Non Focal.   Skin: No Rash.   Wounds: N/A.   Intake/Output from previous day:  Intake/Output Summary (Last 24 hours) at 10/08/12 1132 Last data filed at 10/08/12 0900  Gross per 24 hour  Intake   1287 ml  Output      0 ml  Net   1287 ml     LAB  RESULTS: CBC  Lab 10/07/12 0555 10/06/12 1805 10/05/12 2036 10/03/12 0055  WBC 6.0 6.7 6.5 10.5  HGB 11.1* 12.9 12.4 12.8  HCT 34.9* 39.5 37.7 38.9  PLT 253 254 256 258  MCV 87.5 86.6 88.1 87.6  MCH 27.8 28.3 29.0 28.8  MCHC 31.8 32.7 32.9 32.9  RDW 14.2 14.0 13.9 13.8  LYMPHSABS -- -- 2.0 2.1  MONOABS -- -- 0.5 0.7  EOSABS -- -- 0.1 0.2  BASOSABS -- -- 0.1 0.0  BANDABS -- -- -- --    Chemistries   Lab 10/07/12 0555 10/06/12 1805 10/05/12 2036 10/03/12 0055  NA 137 -- 139 135  K 3.6 -- 4.0 4.2  CL 104 -- 104 100  CO2 21 -- 23 24  GLUCOSE 83 -- 101* 110*  BUN 7 -- 7 11  CREATININE 0.93 0.88 0.84 0.86  CALCIUM 8.4 -- 9.5 9.4  MG -- -- -- --    CBG: No results found for this basename: GLUCAP:5 in the last 168 hours  GFR Estimated Creatinine Clearance: 37 ml/min (by C-G formula based on Cr of 0.93).  Coagulation profile  Lab 10/07/12 0555 10/06/12 1805  INR 1.81* 1.59*  PROTIME -- --    Cardiac Enzymes No results found for this basename: CK:3,CKMB:3,TROPONINI:3,MYOGLOBIN:3 in the last 168 hours  No components found with this basename: POCBNP:3 No results found for this basename: DDIMER:2 in the last 72 hours  No results found for this basename: HGBA1C:2 in the last 72 hours No results found for this basename: CHOL:2,HDL:2,LDLCALC:2,TRIG:2,CHOLHDL:2,LDLDIRECT:2 in the last 72 hours No results found for this basename: TSH,T4TOTAL,FREET3,T3FREE,THYROIDAB in the last 72 hours No results found for this basename: VITAMINB12:2,FOLATE:2,FERRITIN:2,TIBC:2,IRON:2,RETICCTPCT:2 in the last 72 hours No results found for this basename: LIPASE:2,AMYLASE:2 in the last 72 hours  Urine Studies No results found for this basename: UACOL:2,UAPR:2,USPG:2,UPH:2,UTP:2,UGL:2,UKET:2,UBIL:2,UHGB:2,UNIT:2,UROB:2,ULEU:2,UEPI:2,UWBC:2,URBC:2,UBAC:2,CAST:2,CRYS:2,UCOM:2,BILUA:2 in the last 72 hours  MICROBIOLOGY: Recent Results (from the past 240 hour(s))  CULTURE, ROUTINE-ABSCESS      Status: Normal   Collection Time   09/28/12  3:53 PM      Component Value Range Status Comment   Specimen Description ABSCESS AXILLA LEFT   Final    Special Requests NONE   Final    Gram Stain     Final    Value: NO WBC SEEN     NO SQUAMOUS EPITHELIAL CELLS SEEN     FEW GRAM POSITIVE COCCI IN CLUSTERS     IN PAIRS   Culture     Final    Value: MODERATE METHICILLIN RESISTANT STAPHYLOCOCCUS AUREUS     Note: RIFAMPIN AND GENTAMICIN SHOULD NOT BE USED AS SINGLE DRUGS FOR TREATMENT OF STAPH INFECTIONS. This organism DOES NOT demonstrate inducible Clindamycin resistance in vitro. CRITICAL RESULT CALLED TO, READ BACK BY AND VERIFIED WITH: ROBIN Z 12/01 @      830 BY REAMM   Report Status 10/01/2012 FINAL   Final    Organism ID, Bacteria METHICILLIN RESISTANT STAPHYLOCOCCUS AUREUS   Final   URINE CULTURE     Status: Normal   Collection Time   09/29/12  4:52 PM      Component Value Range Status Comment   Specimen Description URINE, CLEAN CATCH   Final    Special Requests NONE   Final    Culture  Setup Time 09/29/2012 17:35   Final    Colony Count NO GROWTH   Final    Culture NO GROWTH   Final    Report Status 09/30/2012 FINAL   Final     RADIOLOGY STUDIES/RESULTS: Dg Abd Acute W/chest  10/03/2012  *RADIOLOGY REPORT*  Clinical Data: Abdominal pain and distention.  Constipation. Anorexia.  ACUTE ABDOMEN SERIES (ABDOMEN 2 VIEW & CHEST 1 VIEW)  Comparison: Chest radiograph on 08/24/2012  Findings: Scattered bowel air fluid levels are seen within both small bowel and colon.  No evidence of dilated bowel loops.  No evidence of free intraperitoneal air.  No radiopaque calculi identified.  Elevation of right hemidiaphragm is stable.  Mild bibasilar scarring also unchanged.  No evidence of acute infiltrate or edema. No evidence of pleural effusion.  Heart size is within normal limits.  No mass or lymphadenopathy identified.  IMPRESSION:  1.  Nonspecific, nonobstructive bowel gas pattern. 2.  No active  cardiopulmonary disease.   Original Report Authenticated By: Myles Rosenthal, M.D.     MEDICATIONS: Scheduled Meds:    . amLODipine  10 mg Oral q morning - 10a  . antiseptic oral rinse  15 mL Mouth Rinse q12n4p  . atorvastatin  10 mg Oral q1800  . chlorhexidine  15 mL Mouth Rinse BID  . clindamycin (CLEOCIN) IV  300 mg Intravenous Q8H  . donepezil  10 mg Oral q morning - 10a  . enoxaparin (LOVENOX) injection  70 mg Subcutaneous Q12H  . pantoprazole (PROTONIX) IV  40 mg Intravenous QHS  . predniSONE  2 mg Oral q morning - 10a  . rOPINIRole  0.5 mg Oral Custom  . rOPINIRole  1 mg Oral Q1200  . saccharomyces boulardii  250 mg Oral BID   Continuous Infusions:    . sodium chloride 75 mL/hr at 10/08/12 0148   PRN Meds:.acetaminophen, LORazepam, nitroGLYCERIN, traMADol, zolpidem  Antibiotics: Anti-infectives     Start     Dose/Rate Route Frequency Ordered Stop   10/06/12 1800   clindamycin (CLEOCIN) IVPB 300 mg        300 mg 100 mL/hr over 30 Minutes Intravenous Every 8 hours 10/06/12 Deanna Artis, MD  [facility] Pager:[phone]  If 7PM-7AM, please contact night-coverage [email or web address] Password [email or web address] 10/08/2012, 11:32 AM   LOS: 2 days

## 2012-10-08 NOTE — Progress Notes (Signed)
     [facility] Daily Rounding Note 10/08/2012, 4:29 PM  SUBJECTIVE: Tolerating liquids OBJECTIVE: General: elderly, pleasant nad  Vital signs in last 24 hours: Temp:  [97.5 F (36.4 C)-98 F (36.7 C)] 97.5 F (36.4 C) (12/08 1414) Pulse Rate:  [55-66] 66  (12/08 1414) Resp:  [18-20] 20  (12/08 1414) BP: (141-166)/(52-79) 166/76 mmHg (12/08 1414) SpO2:  [96 %-98 %] 98 % (12/08 1414) Last BM Date: 10/07/12  Neuro/Psych:  Alert, oriented to place, year and person   Lab Results:  Basename 10/08/12 1148 10/07/12 0555 10/06/12 1805  WBC 8.3 6.0 6.7  HGB 11.8* 11.1* 12.9  HCT 35.4* 34.9* 39.5  PLT 247 253 254   BMET  Basename 10/07/12 0555 10/06/12 1805 10/05/12 2036  NA 137 -- 139  K 3.6 -- 4.0  CL 104 -- 104  CO2 21 -- 23  GLUCOSE 83 -- 101*  BUN 7 -- 7  CREATININE 0.93 0.88 0.84  CALCIUM 8.4 -- 9.5   LFT  Basename 10/07/12 0555  PROT 5.6*  ALBUMIN 2.9*  AST 13  ALT 8  ALKPHOS 49  BILITOT 0.3  BILIDIR --  IBILI --   PT/INR  Basename 10/08/12 1148 10/07/12 0555  LABPROT 22.2* 20.3*  INR 2.04* 1.81*     Studies/Results: Dg Esophagus  10/08/2012  *RADIOLOGY REPORT*  Clinical Data: 78 year old female with solid food dysphagia.  ESOPHOGRAM/BARIUM SWALLOW  Technique:  Single contrast examination was performed using and barium tablet.  The patient could not tolerate the gas crystals for double contrast evaluation.  Fluoroscopy time:  1.5 minutes.  Comparison:  None  Findings:  The pharynx is unremarkable.  Poor primary esophageal contractions are identified.  There are no fixed filling defects, areas of fixed narrowing, or mucosal abnormalities identified within the esophagus.  The barium tablet passed through the esophagus and into the stomach without obstruction.  Gastroesophageal reflux was noted during examination. There is no evidence of hiatal hernia.  IMPRESSION: Gastroesophageal reflux.  Nonspecific esophageal motility disorder.  No other significant  abnormalities identified.   Original Report Authenticated By: Harmon Pier, M.D.    viewed  ASSESMENT: 1) Dysphagia - esophageal. Has esophageal dysmotility at least. Spoke to son and this has been worsening over months. 2) decline in setting of intercurrent illness, dementia   PLAN: 1) NPO after midnight in case INR acceptable to do EGD with possible dilation. The risks and benefits as well as alternatives of endoscopic procedure(s) have been discussed with son and reviewed. All questions answered. We will assess again in AM. It may be that diet modification is best we can do but reasonable to do EGd to exclude infectious esophagitis - she is on prednisone and recent Abx 2) Son interested in placing her in SNF - FYI to hospitalists    LOS: 2 days   Iva Boop, MD, [facility] [phone] (pager) 10/08/2012 4:34 PM

## 2012-10-09 LAB — PROTIME-INR: INR: 2.09 — ABNORMAL HIGH (ref 0.00–1.49)

## 2012-10-09 LAB — BASIC METABOLIC PANEL
Calcium: 8.6 mg/dL (ref 8.4–10.5)
GFR calc Af Amer: 86 mL/min — ABNORMAL LOW (ref >90)
GFR calc non Af Amer: 75 mL/min — ABNORMAL LOW (ref >90)
Glucose, Bld: 86 mg/dL (ref 70–99)
Sodium: 141 mEq/L (ref 135–145)

## 2012-10-09 LAB — CBC
Hemoglobin: 11.4 g/dL — ABNORMAL LOW (ref 12.0–15.0)
MCH: 29.1 pg (ref 26.0–34.0)
MCHC: 32.9 g/dL (ref 30.0–36.0)
RDW: 14.3 % (ref 11.5–15.5)

## 2012-10-09 MED ORDER — PHYTONADIONE 5 MG PO TABS
2.5000 mg | ORAL_TABLET | Freq: Once | ORAL | Status: AC
  Administered 2012-10-09: 2.5 mg via ORAL
  Filled 2012-10-09: qty 1

## 2012-10-09 MED ORDER — PANTOPRAZOLE SODIUM 40 MG PO TBEC
40.0000 mg | DELAYED_RELEASE_TABLET | Freq: Every day | ORAL | Status: DC
  Administered 2012-10-09 – 2012-10-11 (×2): 40 mg via ORAL
  Filled 2012-10-09 (×3): qty 1

## 2012-10-09 MED ORDER — POTASSIUM CHLORIDE CRYS ER 20 MEQ PO TBCR
40.0000 meq | EXTENDED_RELEASE_TABLET | Freq: Once | ORAL | Status: AC
  Administered 2012-10-09: 40 meq via ORAL
  Filled 2012-10-09: qty 2

## 2012-10-09 NOTE — Progress Notes (Signed)
PATIENT DETAILS Name: [name] [name] Age: 78 y.o. Sex: female Date of Birth: 1921/06/10 Admit Date: 10/06/2012 Admitting Physician Zannie Cove, MD UJW:JXBJY,NWGNFA DAVIDSON, MD  Subjective: Admitted with dysphagia to solids-awaiting EGD  Assessment/Plan: Active Problems: Dysphagia -mostly to solids - Barium esophagogram-shows no obvious filling defects -GI following-for EGD when INR is acceptable  MRSA Axillary abscess -recent I&D to b/l axilla by [facility] -will stop Clindamycin  VTE -on Lovenox-therapeutic -coumadin on hold-but still high-likely 2/2 to clindamycin. -monitor INR in am  history of CAD -stable-Not active issue at current time  HTN -controlled with Amlodipine  PMR  -on chronic steroids-resume  Chronic Diarrhea  -at baseline  Restless Leg Syndrome  -c/w Requip   Dementia -stable -c/w Aricept  Dyslipidemia -c/w Statins  Disposition: Remain inpatient  DVT Prophylaxis: On therapeutic Lovenox   Code Status: Full code   Procedures:  None  CONSULTS:  GI  PHYSICAL EXAM: Vital signs in last 24 hours: Filed Vitals:   10/08/12 1153 10/08/12 1414 10/08/12 2200 10/09/12 0607  BP: 162/65 166/76 149/65 141/42  Pulse:  66 59 63  Temp:  97.5 F (36.4 C) 97.7 F (36.5 C) 98.2 F (36.8 C)  TempSrc:  Oral Oral Oral  Resp:  20 20 18   Height:      Weight:      SpO2:  98% 98% 97%    Weight change:  Body mass index is 27.22 kg/(m^2).   Gen Exam: Awake and alert with clear speech.   Neck: Supple, No JVD.   Chest: B/L Clear.   CVS: S1 S2 Regular, no murmurs.  Abdomen: soft, BS +, non tender, non distended.  Extremities: no edema, lower extremities warm to touch.B/L axillary area-no abscess-dressing dry Neurologic: Non Focal.   Skin: No Rash.   Wounds: N/A.   Intake/Output from previous day:  Intake/Output Summary (Last 24 hours) at 10/09/12 1319 Last data filed at 10/09/12 0600  Gross per 24 hour  Intake 1650.83 ml  Output   1900  ml  Net -249.17 ml     LAB RESULTS: CBC  Lab 10/09/12 0520 10/08/12 1148 10/07/12 0555 10/06/12 1805 10/05/12 2036 10/03/12 0055  WBC 6.2 8.3 6.0 6.7 6.5 --  HGB 11.4* 11.8* 11.1* 12.9 12.4 --  HCT 34.6* 35.4* 34.9* 39.5 37.7 --  PLT 262 247 253 254 256 --  MCV 88.3 88.7 87.5 86.6 88.1 --  MCH 29.1 29.6 27.8 28.3 29.0 --  MCHC 32.9 33.3 31.8 32.7 32.9 --  RDW 14.3 14.3 14.2 14.0 13.9 --  LYMPHSABS -- -- -- -- 2.0 2.1  MONOABS -- -- -- -- 0.5 0.7  EOSABS -- -- -- -- 0.1 0.2  BASOSABS -- -- -- -- 0.1 0.0  BANDABS -- -- -- -- -- --    Chemistries   Lab 10/09/12 0520 10/07/12 0555 10/06/12 1805 10/05/12 2036 10/03/12 0055  NA 141 137 -- 139 135  K 3.0* 3.6 -- 4.0 4.2  CL 107 104 -- 104 100  CO2 24 21 -- 23 24  GLUCOSE 86 83 -- 101* 110*  BUN 3* 7 -- 7 11  CREATININE 0.70 0.93 0.88 0.84 0.86  CALCIUM 8.6 8.4 -- 9.5 9.4  MG -- -- -- -- --    CBG: No results found for this basename: GLUCAP:5 in the last 168 hours  GFR Estimated Creatinine Clearance: 43 ml/min (by C-G formula based on Cr of 0.7).  Coagulation profile  Lab 10/09/12 0520 10/08/12 1148 10/07/12 0555 10/06/12 1805  INR  2.09* 2.04* 1.81* 1.59*  PROTIME -- -- -- --    Cardiac Enzymes No results found for this basename: CK:3,CKMB:3,TROPONINI:3,MYOGLOBIN:3 in the last 168 hours  No components found with this basename: POCBNP:3 No results found for this basename: DDIMER:2 in the last 72 hours No results found for this basename: HGBA1C:2 in the last 72 hours No results found for this basename: CHOL:2,HDL:2,LDLCALC:2,TRIG:2,CHOLHDL:2,LDLDIRECT:2 in the last 72 hours No results found for this basename: TSH,T4TOTAL,FREET3,T3FREE,THYROIDAB in the last 72 hours No results found for this basename: VITAMINB12:2,FOLATE:2,FERRITIN:2,TIBC:2,IRON:2,RETICCTPCT:2 in the last 72 hours No results found for this basename: LIPASE:2,AMYLASE:2 in the last 72 hours  Urine Studies No results found for this basename:  UACOL:2,UAPR:2,USPG:2,UPH:2,UTP:2,UGL:2,UKET:2,UBIL:2,UHGB:2,UNIT:2,UROB:2,ULEU:2,UEPI:2,UWBC:2,URBC:2,UBAC:2,CAST:2,CRYS:2,UCOM:2,BILUA:2 in the last 72 hours  MICROBIOLOGY: Recent Results (from the past 240 hour(s))  URINE CULTURE     Status: Normal   Collection Time   09/29/12  4:52 PM      Component Value Range Status Comment   Specimen Description URINE, CLEAN CATCH   Final    Special Requests NONE   Final    Culture  Setup Time 09/29/2012 17:35   Final    Colony Count NO GROWTH   Final    Culture NO GROWTH   Final    Report Status 09/30/2012 FINAL   Final     RADIOLOGY STUDIES/RESULTS: Dg Abd Acute W/chest  10/03/2012  *RADIOLOGY REPORT*  Clinical Data: Abdominal pain and distention.  Constipation. Anorexia.  ACUTE ABDOMEN SERIES (ABDOMEN 2 VIEW & CHEST 1 VIEW)  Comparison: Chest radiograph on 08/24/2012  Findings: Scattered bowel air fluid levels are seen within both small bowel and colon.  No evidence of dilated bowel loops.  No evidence of free intraperitoneal air.  No radiopaque calculi identified.  Elevation of right hemidiaphragm is stable.  Mild bibasilar scarring also unchanged.  No evidence of acute infiltrate or edema. No evidence of pleural effusion.  Heart size is within normal limits.  No mass or lymphadenopathy identified.  IMPRESSION:  1.  Nonspecific, nonobstructive bowel gas pattern. 2.  No active cardiopulmonary disease.   Original Report Authenticated By: Myles Rosenthal, M.D.     MEDICATIONS: Scheduled Meds:    . amLODipine  10 mg Oral q morning - 10a  . antiseptic oral rinse  15 mL Mouth Rinse q12n4p  . atorvastatin  10 mg Oral q1800  . chlorhexidine  15 mL Mouth Rinse BID  . donepezil  10 mg Oral q morning - 10a  . enoxaparin (LOVENOX) injection  70 mg Subcutaneous Q12H  . fluconazole  100 mg Oral Daily  . nystatin cream   Topical BID  . pantoprazole  40 mg Oral Q0600  . [COMPLETED] phytonadione  2.5 mg Oral Once  . potassium chloride  40 mEq Oral Once  .  predniSONE  2 mg Oral q morning - 10a  . rOPINIRole  0.5 mg Oral Custom  . rOPINIRole  1 mg Oral Q1200  . saccharomyces boulardii  250 mg Oral BID  . [DISCONTINUED] clindamycin (CLEOCIN) IV  300 mg Intravenous Q8H  . [DISCONTINUED] pantoprazole (PROTONIX) IV  40 mg Intravenous QHS   Continuous Infusions:    . sodium chloride 300 mL (10/08/12 1310)   PRN Meds:.acetaminophen, LORazepam, nitroGLYCERIN, traMADol, zolpidem  Antibiotics: Anti-infectives     Start     Dose/Rate Route Frequency Ordered Stop   10/08/12 1300   fluconazole (DIFLUCAN) tablet 100 mg        100 mg Oral Daily 10/08/12 1133     10/06/12 1800  clindamycin (CLEOCIN) IVPB 300 mg  Status:  Discontinued        300 mg 100 mL/hr over 30 Minutes Intravenous Every 8 hours 10/06/12 1744 10/09/12 1318           Malcolm Hetz, MD  [facility] Pager:[phone]  If 7PM-7AM, please contact night-coverage [email or web address] Password [email or web address] 10/09/2012, 1:19 PM   LOS: 3 days

## 2012-10-09 NOTE — Progress Notes (Signed)
ANTICOAGULATION CONSULT NOTE - Initial Consult  Pharmacy Consult for Lovenox (while Coumadin on hold) Indication: Hx of DVT (2004) and PE (2011)   Allergies  Allergen Reactions  . Codeine Phosphate Nausea And Vomiting  . Darvocet (Propoxyphene-Acetaminophen) Nausea And Vomiting  . Promethazine Hcl Nausea And Vomiting  . Sulfamethoxazole Other (See Comments)    Pt doesn't remember reaction  . Amoxicillin Nausea And Vomiting and Rash  . Penicillins Nausea And Vomiting and Rash  Patient Measurements: Weight 67.1 kg on 10/06/12 Height 157 cm on 10/06/12 Vital Signs: Temp: 98.2 F (36.8 C) (12/09 0607) Temp src: Oral (12/09 0607) BP: 141/42 mmHg (12/09 0607) Pulse Rate: 63  (12/09 0607) Labs:  Basename 10/09/12 0520 10/08/12 1148 10/07/12 0555 10/06/12 1805  HGB 11.4* 11.8* -- --  HCT 34.6* 35.4* 34.9* --  PLT 262 247 253 --  APTT -- -- -- --  LABPROT 22.6* 22.2* 20.3* --  INR 2.09* 2.04* 1.81* --  HEPARINUNFRC -- -- -- --  CREATININE 0.70 -- 0.93 0.88  CKTOTAL -- -- -- --  CKMB -- -- -- --  TROPONINI -- -- -- --   Estimated Creatinine Clearance: 43 ml/min (by C-G formula based on Cr of 0.7).  Medical History: Past Medical History  Diagnosis Date  . CAD (coronary artery disease)     S/p PTCA / stenting (last cath 2004, multiple LAD stents, 2 stents in the right coronary artery all patent)  . 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 Started coumadin  . HTN (hypertension)   . Restless leg syndrome   . PMR (polymyalgia rheumatica)   . Axillary abscess   . Anxiety   . Dementia   . Cancer   . Complication of anesthesia   . PONV (postoperative nausea and vomiting)   . Shortness of breath   . GERD (gastroesophageal reflux disease)    Medications:  Scheduled:     . amLODipine  10 mg Oral q morning - 10a  . antiseptic oral rinse  15 mL Mouth Rinse q12n4p  . atorvastatin  10 mg Oral q1800  . chlorhexidine  15 mL Mouth Rinse BID  . clindamycin (CLEOCIN) IV   300 mg Intravenous Q8H  . donepezil  10 mg Oral q morning - 10a  . enoxaparin (LOVENOX) injection  70 mg Subcutaneous Q12H  . fluconazole  100 mg Oral Daily  . nystatin cream   Topical BID  . pantoprazole  40 mg Oral Q0600  . [COMPLETED] phytonadione  2.5 mg Oral Once  . predniSONE  2 mg Oral q morning - 10a  . rOPINIRole  0.5 mg Oral Custom  . rOPINIRole  1 mg Oral Q1200  . saccharomyces boulardii  250 mg Oral BID  . [DISCONTINUED] pantoprazole (PROTONIX) IV  40 mg Intravenous QHS    Assessment: 89 YOF on chronic Coumadin for history of DVT in 2004 and PE in 2011 to start treatment dose Lovenox per pharmacy dosing while Coumadin is on hold for possible procedures.  INR rising to 2.09 despite no Coumadin since admit.  MD ordered po vitamin K today.  CBC is wnl.   Goal of Therapy:  Anti-Xa level 0.6-1.2 units/ml 4hrs after LMWH dose given if appropriate Monitor platelets by anticoagulation protocol: Yes   Plan:  1. Continue Lovenox 70 mg q 12 for now.  Noted MD orders to hold Lovenox after midnight tonight. 2. Will f/u after EGD to ensure Lovenox/Coumadin resumed. 3. CBC q 72 hrs while on Lovenox.  Pinkie Manger C 10/09/2012,10:28 AM

## 2012-10-09 NOTE — Evaluation (Signed)
Physical Therapy Evaluation Patient Details Name: [name] [name] MRN: [id number] DOB: January 09, 1921 Today's Date: 10/09/2012 Time: 4696-2952 PT Time Calculation (min): 12 min  PT Assessment / Plan / Recommendation Clinical Impression  Pt adm with dysphagia of solids.  Pt with recent adm and dc.  Pt with generalized weakness and  decr activity tolerance.Recommed ST-SNF.    PT Assessment  Patient needs continued PT services    Follow Up Recommendations  SNF    Does the patient have the potential to tolerate intense rehabilitation      Barriers to Discharge        Equipment Recommendations  None recommended by PT    Recommendations for Other Services     Frequency Min 3X/week    Precautions / Restrictions Precautions Precautions: Fall   Pertinent Vitals/Pain N/A      Mobility  Bed Mobility Supine to Sit: 4: Min guard;With rails;HOB elevated Transfers Sit to Stand: 4: Min assist;With upper extremity assist;From bed Stand to Sit: 4: Min assist;Without upper extremity assist;To chair/3-in-1 Details for Transfer Assistance: Verbal cues for hand placement. Pt sat in chair without reaching for armrests. Ambulation/Gait Ambulation/Gait Assistance: 4: Min assist Ambulation Distance (Feet): 140 Feet Assistive device: Rolling walker Ambulation/Gait Assistance Details: verbal cues to stay closer to walker. Gait Pattern: Step-through pattern    Shoulder Instructions     Exercises     PT Diagnosis: Difficulty walking;Generalized weakness  PT Problem List: Decreased strength;Decreased activity tolerance;Decreased balance;Decreased mobility;Decreased knowledge of use of DME PT Treatment Interventions: DME instruction;Gait training;Stair training;Functional mobility training;Patient/family education;Therapeutic activities;Therapeutic exercise;Balance training   PT Goals Acute Rehab PT Goals PT Goal Formulation: With patient Time For Goal Achievement: 10/16/12 Potential to  Achieve Goals: Good PT Goal: Supine/Side to Sit - Progress: Goal set today PT Goal: Sit to Supine/Side - Progress: Goal set today PT Goal: Sit to Stand - Progress: Goal set today PT Goal: Stand to Sit - Progress: Goal set today PT Goal: Ambulate - Progress: Goal set today PT Goal: Up/Down Stairs - Progress: Goal set today  Visit Information  Last PT Received On: 10/09/12 Assistance Needed: +1    Subjective Data  Subjective: Pt agreeable to amb. Patient Stated Goal: Return home   Prior Functioning  Home Living Lives With: Son Available Help at Discharge: Family;Available 24 hours/day Type of Home: House Home Access: Stairs to enter [employer] of Steps: 1 Entrance Stairs-Rails: None Home Layout: One level Bathroom Shower/Tub: Tub/shower unit Home Adaptive Equipment: Walker - rolling;Shower chair with back;Hand-held shower hose Prior Function Level of Independence: Independent with assistive device(s) Driving: No Communication Communication: HOH Dominant Hand: Right    Cognition       Extremity/Trunk Assessment Right Lower Extremity Assessment RLE ROM/Strength/Tone: Deficits RLE ROM/Strength/Tone Deficits: grossly 4/5 Left Lower Extremity Assessment LLE ROM/Strength/Tone: Deficits LLE ROM/Strength/Tone Deficits: grossly 4/5   Balance Static Standing Balance Static Standing - Balance Support: Bilateral upper extremity supported Static Standing - Level of Assistance: 5: Stand by assistance  End of Session PT - End of Session Equipment Utilized During Treatment: Gait belt Activity Tolerance: Patient tolerated treatment well Patient left: in chair;with call bell/phone within reach  GP     [facility] 10/09/2012, 11:57 AM  [facility] PT [phone]

## 2012-10-09 NOTE — Progress Notes (Signed)
Call doctor to clear up order about giving pt lovenox- order clarified, pt received dose.

## 2012-10-09 NOTE — Plan of Care (Signed)
Problem: Phase II Progression Outcomes Goal: Progress activity as tolerated unless otherwise ordered OOB to chair today tolerated well

## 2012-10-09 NOTE — Progress Notes (Signed)
     [name] [name] Daily Rounding Note 10/09/2012, 8:27 AM  SUBJECTIVE:       No complaints except would like to eat.    OBJECTIVE:         Vital signs in last 24 hours:    Temp:  [97.5 F (36.4 C)-98.2 F (36.8 C)] 98.2 F (36.8 C) (12/09 0607) Pulse Rate:  [59-66] 63  (12/09 0607) Resp:  [18-20] 18  (12/09 0607) BP: (141-166)/(42-76) 141/42 mmHg (12/09 0607) SpO2:  [97 %-98 %] 97 % (12/09 0607) Last BM Date: 10/07/12 General: pleasant, NAD   Heart:  Chest: No resp distress or cough.  Abdomen:   Extremities:  Neuro/Psych:  Cooperative, not agitated, pleasant Did not lay hands on pt to examine pt.   Intake/Output from previous day: 12/08 0701 - 12/09 0700 In: 2362.8 [P.O.:1422; I.V.:940.8] Out: 2350 [Urine:2350]  Intake/Output this shift:    Lab Results:  Basename 10/09/12 0520 10/08/12 1148 10/07/12 0555  WBC 6.2 8.3 6.0  HGB 11.4* 11.8* 11.1*  HCT 34.6* 35.4* 34.9*  PLT 262 247 253   BMET  Basename 10/09/12 0520 10/07/12 0555 10/06/12 1805  NA 141 137 --  K 3.0* 3.6 --  CL 107 104 --  CO2 24 21 --  GLUCOSE 86 83 --  BUN 3* 7 --  CREATININE 0.70 0.93 0.88  CALCIUM 8.6 8.4 --   LFT  Basename 10/07/12 0555  PROT 5.6*  ALBUMIN 2.9*  AST 13  ALT 8  ALKPHOS 49  BILITOT 0.3  BILIDIR --  IBILI --   PT/INR  Basename 10/09/12 0520 10/08/12 1148  LABPROT 22.6* 22.2*  INR 2.09* 2.04*    Studies/Results: Dg Esophagus 10/08/2012  .  IMPRESSION: Gastroesophageal reflux.  Nonspecific esophageal motility disorder.  No other significant abnormalities identified.   Original Report Authenticated By: Jeffrey Hu, M.D.     ASSESMENT: *  Dysphagia.   Esophageal .  Dysmotility on esophagram. INR too high for esoph dilatation.  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  On hold.  Lovenox in place.    PLAN: *  EGD with dilatation when INR at 1.5. Tentatively set for 1300 tomorrow 12/10. *  Dr Ghimire is ordering dose of Vit K .  *   Restart her clear po's.  *  Make her NPO again tonight, in case we can do EGD tomorrow. Also hold Lovenox after tonights dose.  *  Note that if son is thinking SNF, would it be judicious to discuss code status, perhaps offer goals of care meeting to the pt's son?    LOS: 3 days   [name] [name]  10/09/2012, 8:27 AM Pager: [phone]  

## 2012-10-09 NOTE — Progress Notes (Signed)
SNF referral rec'd today- met with patient and called her son, Gerlene Burdock- they are both willing to consider SNF- patient has [facility] and will require auth for SNF.  Will begin this process and advise-   Reece Levy, MSW, Theresia Majors [phone]

## 2012-10-10 ENCOUNTER — Encounter (HOSPITAL_COMMUNITY): Payer: Self-pay

## 2012-10-10 ENCOUNTER — Encounter (HOSPITAL_COMMUNITY)
Admission: AD | Disposition: A | Discharge status: Skilled Nursing Facility | Payer: Self-pay | Source: Ambulatory Visit | Attending: Internal Medicine

## 2012-10-10 DIAGNOSIS — K222 Esophageal obstruction: Principal | ICD-10-CM | POA: Diagnosis present

## 2012-10-10 HISTORY — PX: ESOPHAGOGASTRODUODENOSCOPY (EGD) WITH ESOPHAGEAL DILATION: SHX5812

## 2012-10-10 LAB — BASIC METABOLIC PANEL WITH GFR
BUN: 3 mg/dL — ABNORMAL LOW (ref 6–23)
CO2: 24 meq/L (ref 19–32)
Calcium: 8.5 mg/dL (ref 8.4–10.5)
Chloride: 109 meq/L (ref 96–112)
Creatinine, Ser: 0.82 mg/dL (ref 0.50–1.10)
GFR calc Af Amer: 71 mL/min — ABNORMAL LOW
GFR calc non Af Amer: 62 mL/min — ABNORMAL LOW
Glucose, Bld: 86 mg/dL (ref 70–99)
Potassium: 3.6 meq/L (ref 3.5–5.1)
Sodium: 140 meq/L (ref 135–145)

## 2012-10-10 LAB — PROTIME-INR: INR: 1.19 (ref 0.00–1.49)

## 2012-10-10 SURGERY — ESOPHAGOGASTRODUODENOSCOPY (EGD) WITH ESOPHAGEAL DILATION
Anesthesia: Moderate Sedation

## 2012-10-10 MED ORDER — FENTANYL CITRATE 0.05 MG/ML IJ SOLN
INTRAMUSCULAR | Status: AC
  Filled 2012-10-10: qty 2

## 2012-10-10 MED ORDER — WARFARIN - PHARMACIST DOSING INPATIENT
Freq: Every day | Status: DC
  Administered 2012-10-10: 18:00:00

## 2012-10-10 MED ORDER — MIDAZOLAM HCL 5 MG/ML IJ SOLN
INTRAMUSCULAR | Status: AC
  Filled 2012-10-10: qty 2

## 2012-10-10 MED ORDER — CHLORHEXIDINE GLUCONATE CLOTH 2 % EX PADS: 6.0000 | MEDICATED_PAD | Freq: Every day | CUTANEOUS | Status: DC

## 2012-10-10 MED ORDER — MUPIROCIN 2 % EX OINT
1.0000 "application " | TOPICAL_OINTMENT | Freq: Two times a day (BID) | CUTANEOUS | Status: DC
  Administered 2012-10-10 – 2012-10-11 (×2): 1 via NASAL
  Filled 2012-10-10: qty 22

## 2012-10-10 MED ORDER — WARFARIN SODIUM 4 MG PO TABS
4.0000 mg | ORAL_TABLET | Freq: Once | ORAL | Status: AC
  Administered 2012-10-10: 4 mg via ORAL
  Filled 2012-10-10: qty 1

## 2012-10-10 MED ORDER — ENOXAPARIN SODIUM 80 MG/0.8ML ~~LOC~~ SOLN
70.0000 mg | Freq: Two times a day (BID) | SUBCUTANEOUS | Status: DC
  Administered 2012-10-10 – 2012-10-11 (×2): 70 mg via SUBCUTANEOUS
  Filled 2012-10-10 (×4): qty 0.8

## 2012-10-10 MED ORDER — FENTANYL CITRATE 0.05 MG/ML IJ SOLN
INTRAMUSCULAR | Status: DC | PRN
  Administered 2012-10-10 (×2): 25 ug via INTRAVENOUS
  Administered 2012-10-10: 10 ug via INTRAVENOUS

## 2012-10-10 MED ORDER — BUTAMBEN-TETRACAINE-BENZOCAINE 2-2-14 % EX AERO
INHALATION_SPRAY | CUTANEOUS | Status: DC | PRN
  Administered 2012-10-10 (×2): 1 via TOPICAL

## 2012-10-10 MED ORDER — MIDAZOLAM HCL 10 MG/2ML IJ SOLN
INTRAMUSCULAR | Status: DC | PRN
  Administered 2012-10-10: 2 mg via INTRAVENOUS
  Administered 2012-10-10: 1 mg via INTRAVENOUS

## 2012-10-10 NOTE — Progress Notes (Signed)
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 Codeine Phosphate Nausea And Vomiting  . Darvocet (Propoxyphene-Acetaminophen) Nausea And Vomiting  . Promethazine Hcl Nausea And Vomiting  . Sulfamethoxazole Other (See Comments)    Pt doesn't remember reaction  . Amoxicillin Nausea And Vomiting and Rash  . Penicillins Nausea And Vomiting and Rash    Patient Measurements: Height: 5\' 2"  (157.5 cm) Weight: 148 lb 13 oz (67.5 kg) IBW/kg (Calculated) : 50.1   Vital Signs: Temp: 97.7 F (36.5 C) (12/10 1508) Temp src: Oral (12/10 1508) BP: 174/86 mmHg (12/10 1508) Pulse Rate: 55  (12/10 1508)  Labs:  Basename 10/10/12 9629 10/09/12 0520 10/08/12 1148  HGB -- 11.4* 11.8*  HCT -- 34.6* 35.4*  PLT -- 262 247  APTT -- -- --  LABPROT 14.9 22.6* 22.2*  INR 1.19 2.09* 2.04*  HEPARINUNFRC -- -- --  CREATININE 0.82 0.70 --  CKTOTAL -- -- --  CKMB -- -- --  TROPONINI -- -- --    Estimated Creatinine Clearance: 41.9 ml/min (by C-G formula based on Cr of 0.82).  Assessment: 78 y/o female on chronic Coumadin for hx DVT in 2004 and PE in 2011 to resume Lovenox and Coumadin post EGD. No bleeding noted, CBC appears stable.  EGD showed a possible early esophageal stricture that was dilated.  Goal of Therapy:  INR 2-3 Anti-Xa level 0.6-1.2 units/ml 4hrs after LMWH dose given Monitor platelets by anticoagulation protocol: Yes   Plan:  -Lovenox 70 mg SQ q12h -Coumadin 4 mg po tonight -CBC q72h while on Lovenox, INR daily  [facility], [address].D., BCPS Clinical Pharmacist Pager: [phone] 10/10/2012 3:48 PM

## 2012-10-10 NOTE — H&P (View-Only) (Signed)
     [facility] Daily Rounding Note 10/09/2012, 8:27 AM  SUBJECTIVE:       No complaints except would like to eat.    OBJECTIVE:         Vital signs in last 24 hours:    Temp:  [97.5 F (36.4 C)-98.2 F (36.8 C)] 98.2 F (36.8 C) (12/09 0607) Pulse Rate:  [59-66] 63  (12/09 0607) Resp:  [18-20] 18  (12/09 0607) BP: (141-166)/(42-76) 141/42 mmHg (12/09 0607) SpO2:  [97 %-98 %] 97 % (12/09 0607) Last BM Date: 10/07/12 General: pleasant, NAD   Heart:  Chest: No resp distress or cough.  Abdomen:   Extremities:  Neuro/Psych:  Cooperative, not agitated, pleasant Did not lay hands on pt to examine pt.   Intake/Output from previous day: 12/08 0701 - 12/09 0700 In: 2362.8 [P.O.:1422; I.V.:940.8] Out: 2350 [Urine:2350]  Intake/Output this shift:    Lab Results:  Basename 10/09/12 0520 10/08/12 1148 10/07/12 0555  WBC 6.2 8.3 6.0  HGB 11.4* 11.8* 11.1*  HCT 34.6* 35.4* 34.9*  PLT 262 247 253   BMET  Basename 10/09/12 0520 10/07/12 0555 10/06/12 1805  NA 141 137 --  K 3.0* 3.6 --  CL 107 104 --  CO2 24 21 --  GLUCOSE 86 83 --  BUN 3* 7 --  CREATININE 0.70 0.93 0.88  CALCIUM 8.6 8.4 --   LFT  Basename 10/07/12 0555  PROT 5.6*  ALBUMIN 2.9*  AST 13  ALT 8  ALKPHOS 49  BILITOT 0.3  BILIDIR --  IBILI --   PT/INR  Basename 10/09/12 0520 10/08/12 1148  LABPROT 22.6* 22.2*  INR 2.09* 2.04*    Studies/Results: Dg Esophagus 10/08/2012  .  IMPRESSION: Gastroesophageal reflux.  Nonspecific esophageal motility disorder.  No other significant abnormalities identified.   Original Report Authenticated By: Harmon Pier, M.D.     ASSESMENT: *  Dysphagia.   Esophageal .  Dysmotility on esophagram. INR too high for esoph dilatation.  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  On hold.  Lovenox in place.    PLAN: *  EGD with dilatation when INR at 1.5. Tentatively set for 1300 tomorrow 12/10. *  Dr Jerral Ralph is ordering dose of Vit K .  *   Restart her clear po's.  *  Make her NPO again tonight, in case we can do EGD tomorrow. Also hold Lovenox after tonights dose.  *  Note that if son is thinking SNF, would it be judicious to discuss code status, perhaps offer goals of care meeting to the pt's son?    LOS: 3 days   Jennye Moccasin  10/09/2012, 8:27 AM Pager: [phone]

## 2012-10-10 NOTE — Progress Notes (Signed)
Clinical Social Work Department CLINICAL SOCIAL WORK PLACEMENT NOTE 10/10/2012  Patient:  [name] [name], [name] [name]  Account Number:  [id number] Admit date:  09/06/2012  Clinical Social Worker:  Robin Searing  Date/time:  10/10/2012 10:27 AM  Clinical Social Work is seeking post-discharge placement for this patient at the following level of care:   SKILLED NURSING   (*CSW will update this form in Epic as items are completed)   10/09/2012  Patient/family provided with [facility] Department of Clinical Social Work's list of facilities offering this level of care within the geographic area requested by the patient (or if unable, by the patient's family).  10/09/2012  Patient/family informed of their freedom to choose among providers that offer the needed level of care, that participate in Medicare, Medicaid or managed care program needed by the patient, have an available bed and are willing to accept the patient.  10/09/2012  Patient/family informed of [facility]' ownership interest in [facility], as well as of the fact that they are under no obligation to receive care at this facility.  PASARR submitted to EDS on 10/09/2012 PASARR number received from EDS on 10/09/2012  FL2 transmitted to all facilities in geographic area requested by pt/family on  10/09/2012 FL2 transmitted to all facilities within larger geographic area on   Patient informed that his/her managed care company has contracts with or will negotiate with  certain facilities, including the following:     Patient/family informed of bed offers received:  10/10/2012 Patient chooses bed at [facility] Physician recommends and patient chooses bed at    Patient to be transferred to  on   Patient to be transferred to facility by   The following physician request were entered in Epic:   Additional Comments:  Reece Levy, MSW, Theresia Majors [phone]

## 2012-10-10 NOTE — Progress Notes (Deleted)
Clinical Social Work Department BRIEF PSYCHOSOCIAL ASSESSMENT 10/10/2012  Patient:  [name] [name], [name] [name]     Account Number:  [id number]     Admit date:  09/06/2012  Clinical Social Worker:  Dennison Bulla  Date/Time:  09/07/2012 10:30 AM  Referred by:  Physician  Date Referred:  09/07/2012 Referred for  SNF Placement   Other Referral:   Interview type:  Family Other interview type:    PSYCHOSOCIAL DATA Living Status:  FAMILY Admitted from facility:   Level of care:   Primary support name:  [name] [name] Primary support relationship to patient:  CHILD, ADULT Degree of support available:   Strong    CURRENT CONCERNS Current Concerns  Post-Acute Placement   Other Concerns:    SOCIAL WORK ASSESSMENT / PLAN CSW received referral to assist with SNF placement. CSW reviewed chart and met with two sons [name] [name] and [name] [name]). Patient was sleeping and did not participate in assessment.    CSW introduced myself and explained role. Patient has been living with son [name] [name]) but son has recently had back surgery and is unable to care for patient. Son reports that patient has had several falls and is weak at this time. Sons have already started looking at facilities and are interested in [facility] or [facility]. CSW explained process and provided sons with SNF list. Sons already aware of Medicare benefits and copays. CSW spoke with sons about applying for Medicaid in case patient needs long term placement. Sons agreeable to faxing out information.    CSW submitted pasarr, completed FL2 and faxed out. CSW will follow up with bed offers.   Assessment/plan status:  Psychosocial Support/Ongoing Assessment of Needs Other assessment/ plan:   Information/referral to community resources:   Referral to Department of Social Services  SNF list    PATIENT'S/FAMILY'S RESPONSE TO PLAN OF CARE: Patient did not participate in assessment at this time due to being confused. Sons engaged throughout assessment and  appreciative of CSW consult. Sons agreeable to CSW follow up and has CSW contact information.     Reece Levy, MSW, Theresia Majors [phone]

## 2012-10-10 NOTE — Progress Notes (Signed)
EGD showed a possible early esophageal stricture.  Stricture was dilated.  Will restart diet.

## 2012-10-10 NOTE — Progress Notes (Addendum)
SLP Cancellation Note  Patient Details Name: LURA FALOR MRN: [id number] DOB: 1921/04/19   Cancelled treatment:        Unable to observe with po's due to NPO status for possible EGD if INR is therapeutic.  Reviewed results of esophagram which reported GERD, esophageal motility disorder.  SLP will f/u x 1 after EGD performed to provide further education regarding precautions with GERD and answer any additional questions pt. or family may have regarding swallow status.   Breck Coons [place].Ed [employer] [phone]  10/10/2012

## 2012-10-10 NOTE — Progress Notes (Signed)
[facility]  Patient Status: Active (receiving services up to time of hospitalization)  [facility] is providing the following services: RN, PT and OT  If patient discharges after hours, please call [phone].   [name] [name] 10/10/2012, 5:05 PM

## 2012-10-10 NOTE — Progress Notes (Signed)
PATIENT DETAILS Name: ANAHI BELMAR Age: 78 y.o. Sex: female Date of Birth: 20-Apr-1921 Admit Date: 10/06/2012 Admitting Physician Zannie Cove, MD ZOX:[id number] DAVIDSON, MD  Subjective: No major complaints overnight-awaiting EGD  Assessment/Plan: Active Problems: Dysphagia -mostly to solids - Barium esophagogram-shows no obvious filling defects -EGD today  MRSA Axillary abscess -recent I&D to b/l axilla by [facility] - stopped Clindamycin 12/9  VTE -on Lovenox-therapeutic -coumadin on hold-but still high on 12/9-likely 2/2 to clindamycin-therefore given Vit K to get INR more acceptable for EGD. -INR this am 1.19-will need to resume Lovenox post EGD-at the discretion of GI  history of CAD -stable-Not active issue at current time  HTN -controlled with Amlodipine  PMR  -on chronic steroids-resume  Chronic Diarrhea  -at baseline  Restless Leg Syndrome  -c/w Requip   Dementia -stable -c/w Aricept  Dyslipidemia -c/w Statins  Disposition: Remain inpatient-for SNF placement  DVT Prophylaxis: On therapeutic Lovenox -but on hold currently-as patient due for EGD today  Code Status: Full code   Procedures:  None  CONSULTS:  GI  PHYSICAL EXAM: Vital signs in last 24 hours: Filed Vitals:   10/09/12 0607 10/09/12 1330 10/09/12 2100 10/10/12 0515  BP: 141/42 153/70 159/63 133/76  Pulse: 63 54 52 75  Temp: 98.2 F (36.8 C) 97.3 F (36.3 C) 98.3 F (36.8 C) 98.5 F (36.9 C)  TempSrc: Oral Oral Oral Oral  Resp: 18 20 22 18   Height:      Weight:      SpO2: 97% 97% 96% 97%    Weight change:  Body mass index is 27.22 kg/(m^2).   Gen Exam: Awake and alert with clear speech.   Neck: Supple, No JVD.   Chest: B/L Clear.   CVS: S1 S2 Regular, no murmurs.  Abdomen: soft, BS +, non tender, non distended.  Extremities: no edema, lower extremities warm to touch.B/L axillary area-no abscess-dressing dry Neurologic: Non Focal.   Skin: No Rash.   Wounds:  N/A.   Intake/Output from previous day:  Intake/Output Summary (Last 24 hours) at 10/10/12 1121 Last data filed at 10/10/12 0900  Gross per 24 hour  Intake    180 ml  Output      0 ml  Net    180 ml     LAB RESULTS: CBC  Lab 10/09/12 0520 10/08/12 1148 10/07/12 0555 10/06/12 1805 10/05/12 2036  WBC 6.2 8.3 6.0 6.7 6.5  HGB 11.4* 11.8* 11.1* 12.9 12.4  HCT 34.6* 35.4* 34.9* 39.5 37.7  PLT 262 247 253 254 256  MCV 88.3 88.7 87.5 86.6 88.1  MCH 29.1 29.6 27.8 28.3 29.0  MCHC 32.9 33.3 31.8 32.7 32.9  RDW 14.3 14.3 14.2 14.0 13.9  LYMPHSABS -- -- -- -- 2.0  MONOABS -- -- -- -- 0.5  EOSABS -- -- -- -- 0.1  BASOSABS -- -- -- -- 0.1  BANDABS -- -- -- -- --    Chemistries   Lab 10/10/12 0635 10/09/12 0520 10/07/12 0555 10/06/12 1805 10/05/12 2036  NA 140 141 137 -- 139  K 3.6 3.0* 3.6 -- 4.0  CL 109 107 104 -- 104  CO2 24 24 21  -- 23  GLUCOSE 86 86 83 -- 101*  BUN 3* 3* 7 -- 7  CREATININE 0.82 0.70 0.93 0.88 0.84  CALCIUM 8.5 8.6 8.4 -- 9.5  MG -- -- -- -- --    CBG: No results found for this basename: GLUCAP:5 in the last 168 hours  GFR Estimated Creatinine Clearance: 41.9  ml/min (by C-G formula based on Cr of 0.82).  Coagulation profile  Lab 10/10/12 0635 10/09/12 0520 10/08/12 1148 10/07/12 0555 10/06/12 1805  INR 1.19 2.09* 2.04* 1.81* 1.59*  PROTIME -- -- -- -- --    Cardiac Enzymes No results found for this basename: CK:3,CKMB:3,TROPONINI:3,MYOGLOBIN:3 in the last 168 hours  No components found with this basename: POCBNP:3 No results found for this basename: DDIMER:2 in the last 72 hours No results found for this basename: HGBA1C:2 in the last 72 hours No results found for this basename: CHOL:2,HDL:2,LDLCALC:2,TRIG:2,CHOLHDL:2,LDLDIRECT:2 in the last 72 hours No results found for this basename: TSH,T4TOTAL,FREET3,T3FREE,THYROIDAB in the last 72 hours No results found for this basename: VITAMINB12:2,FOLATE:2,FERRITIN:2,TIBC:2,IRON:2,RETICCTPCT:2 in the  last 72 hours No results found for this basename: LIPASE:2,AMYLASE:2 in the last 72 hours  Urine Studies No results found for this basename: UACOL:2,UAPR:2,USPG:2,UPH:2,UTP:2,UGL:2,UKET:2,UBIL:2,UHGB:2,UNIT:2,UROB:2,ULEU:2,UEPI:2,UWBC:2,URBC:2,UBAC:2,CAST:2,CRYS:2,UCOM:2,BILUA:2 in the last 72 hours  MICROBIOLOGY: No results found for this or any previous visit (from the past 240 hour(s)).  RADIOLOGY STUDIES/RESULTS: Dg Abd Acute W/chest  10/03/2012  *RADIOLOGY REPORT*  Clinical Data: Abdominal pain and distention.  Constipation. Anorexia.  ACUTE ABDOMEN SERIES (ABDOMEN 2 VIEW & CHEST 1 VIEW)  Comparison: Chest radiograph on 08/24/2012  Findings: Scattered bowel air fluid levels are seen within both small bowel and colon.  No evidence of dilated bowel loops.  No evidence of free intraperitoneal air.  No radiopaque calculi identified.  Elevation of right hemidiaphragm is stable.  Mild bibasilar scarring also unchanged.  No evidence of acute infiltrate or edema. No evidence of pleural effusion.  Heart size is within normal limits.  No mass or lymphadenopathy identified.  IMPRESSION:  1.  Nonspecific, nonobstructive bowel gas pattern. 2.  No active cardiopulmonary disease.   Original Report Authenticated By: Myles Rosenthal, M.D.     MEDICATIONS: Scheduled Meds:    . amLODipine  10 mg Oral q morning - 10a  . antiseptic oral rinse  15 mL Mouth Rinse q12n4p  . atorvastatin  10 mg Oral q1800  . chlorhexidine  15 mL Mouth Rinse BID  . donepezil  10 mg Oral q morning - 10a  . [EXPIRED] enoxaparin (LOVENOX) injection  70 mg Subcutaneous Q12H  . fluconazole  100 mg Oral Daily  . nystatin cream   Topical BID  . pantoprazole  40 mg Oral Q0600  . [COMPLETED] potassium chloride  40 mEq Oral Once  . predniSONE  2 mg Oral q morning - 10a  . rOPINIRole  0.5 mg Oral Custom  . rOPINIRole  1 mg Oral Q1200  . saccharomyces boulardii  250 mg Oral BID  . [DISCONTINUED] clindamycin (CLEOCIN) IV  300 mg  Intravenous Q8H   Continuous Infusions:    . sodium chloride 300 mL (10/08/12 1310)   PRN Meds:.acetaminophen, LORazepam, nitroGLYCERIN, traMADol, zolpidem  Antibiotics: Anti-infectives     Start     Dose/Rate Route Frequency Ordered Stop   10/08/12 1300   fluconazole (DIFLUCAN) tablet 100 mg        100 mg Oral Daily 10/08/12 1133 10/10/12 1200   10/06/12 1800   clindamycin (CLEOCIN) IVPB 300 mg  Status:  Discontinued        300 mg 100 mL/hr over 30 Minutes Intravenous Every 8 hours 10/06/12 1744 10/09/12 1318           Cariann Kinnamon, MD  [facility] Pager:[phone]  If 7PM-7AM, please contact night-coverage [email or web address] Password [email or web address] 10/10/2012, 11:21 AM   LOS: 4 days

## 2012-10-10 NOTE — Op Note (Signed)
[facility] [address] [place], [address]   ENDOSCOPY PROCEDURE REPORT  PATIENT: [name] [name], [name] [name]  MR#: [id number] BIRTHDATE: [birth year] , 89  yrs. old GENDER: Female ENDOSCOPIST: Louis Meckel, MD ASSISTANT:   Vita Erm, RN Windell Hummingbird, technician REFERRED XB:[id number] Terri Piedra, M.D.  Lina Sar, M.D. PROCEDURE DATE:  10/10/2012 PROCEDURE:   EGD with balloon dilatation ASA CLASS:   Class III INDICATIONS:dysphagia. MEDICATIONS: These medications were titrated to patient response per physician's verbal order, Versed 3 mg IV, and Fentanyl-Detailed 60 mcg IV TOPICAL ANESTHETIC:   Cetacaine Spray  DESCRIPTION OF PROCEDURE:   After the risks benefits and alternatives of the procedure were thoroughly explained, informed consent was obtained.  The     endoscope was introduced through the mouth  and advanced to the third portion of the duodenum ,      The instrument was slowly withdrawn as the mucosa was carefully examined.      ESOPHAGUS: Marginally narrowed GE junction.  Scope easily traverses distal esophagus into stomach.  Stricture was found at the gastroesophageal junction.  The remainder of the upper endoscopy exam was otherwise normal. Dilation was then performed at the gastroesphageal junction  Dilator:Balloon Size:34mm  Reststance:minimal Heme:none Appearance:satisfactory  COMPLICATIONS: There were no complications. ENDOSCOPIC IMPRESSION: 1. Possible early esophageal stricture - s/p balloon dilitation  RECOMMENDATIONS: repeat diliation prn  eSigned:  Louis Meckel, MD 10/10/2012 1:48 PM  CC:

## 2012-10-10 NOTE — Progress Notes (Cosign Needed)
INR  1.59 Will proceed to EGD set up for today. Lovenox has been d/c'd for now. Consent is signed.  Pt is NPO  Jennye Moccasin PA-C

## 2012-10-10 NOTE — Progress Notes (Addendum)
Clinical Social Work Department CLINICAL SOCIAL WORK PLACEMENT NOTE 10/10/2012  Patient:  [name] [name], [name] [name]  Account Number:  [id number] Admit date:  09/06/2012  Clinical Social Worker:  Robin Searing  Date/time:  10/10/2012 10:27 AM  Clinical Social Work is seeking post-discharge placement for this patient at the following level of care:   SKILLED NURSING   (*CSW will update this form in Epic as items are completed)   10/09/2012  Patient/family provided with [facility] Department of Clinical Social Work's list of facilities offering this level of care within the geographic area requested by the patient (or if unable, by the patient's family).  10/09/2012  Patient/family informed of their freedom to choose among providers that offer the needed level of care, that participate in Medicare, Medicaid or managed care program needed by the patient, have an available bed and are willing to accept the patient.  10/09/2012  Patient/family informed of [facility]' ownership interest in [facility], as well as of the fact that they are under no obligation to receive care at this facility.  PASARR submitted to EDS on 10/09/2012 PASARR number received from EDS on 10/09/2012  FL2 transmitted to all facilities in geographic area requested by pt/family on  10/09/2012 FL2 transmitted to all facilities within larger geographic area on   Patient informed that his/her managed care company has contracts with or will negotiate with  certain facilities, including the following:     Patient/family informed of bed offers received:  10/10/2012 Patient chooses bed at [facility] Physician recommends and patient chooses bed at    Patient to be transferred to [facility] on  10/11/12  (DTC) Patient to be transferred to facility by Ambulance Sharin Mons)  (DTC)  The following physician request were entered in Epic:   Additional Comments:  Reece Levy,  MSW, Theresia Majors [phone]  10/11/12  Notified pt's son of d/c and he will sign admit papers at [facility]. Patient notified as well.  DC plan discussed with pt's nurse and SNF.  No further CSW intervention indicated.  Lorri Frederick. [place]  [phone]

## 2012-10-10 NOTE — Progress Notes (Signed)
SNF bed confirmed and selected at [facility]- clinical sent to [facility] for SNF auth- will update patient and son and advise once SNF auth rec'd. Reece Levy, MSW, Theresia Majors [phone]

## 2012-10-10 NOTE — Interval H&P Note (Signed)
History and Physical Interval Note:  10/10/2012 1:22 PM  [name] [name]  has presented today for surgery, with the diagnosis of dysphagia  The various methods of treatment have been discussed with the patient and family. After consideration of risks, benefits and other options for treatment, the patient has consented to  Procedure(s) (LRB) with comments: ESOPHAGOGASTRODUODENOSCOPY (EGD) WITH ESOPHAGEAL DILATION (N/A) as a surgical intervention .  The patient's history has been reviewed, patient examined, no change in status, stable for surgery.  I have reviewed the patient's chart and labs.  Questions were answered to the patient's satisfaction.    The recent H&P (dated *10/10/12**) was reviewed, the patient was examined and there is no change in the patients condition since that H&P was completed.   [name]  10/10/2012, 1:22 PM    [name]

## 2012-10-11 ENCOUNTER — Encounter (HOSPITAL_COMMUNITY): Payer: Self-pay | Admitting: Gastroenterology

## 2012-10-11 DIAGNOSIS — I10 Essential (primary) hypertension: Secondary | ICD-10-CM

## 2012-10-11 DIAGNOSIS — D649 Anemia, unspecified: Secondary | ICD-10-CM

## 2012-10-11 LAB — PROTIME-INR
INR: 1.23 (ref 0.00–1.49)
Prothrombin Time: 15.3 seconds — ABNORMAL HIGH (ref 11.6–15.2)

## 2012-10-11 MED ORDER — TRAMADOL HCL 50 MG PO TABS: 25.0000 mg | ORAL_TABLET | Freq: Four times a day (QID) | ORAL | Status: DC | PRN

## 2012-10-11 MED ORDER — ENOXAPARIN SODIUM 80 MG/0.8ML ~~LOC~~ SOLN: 80.0000 mg | Freq: Two times a day (BID) | SUBCUTANEOUS | Status: DC

## 2012-10-11 MED ORDER — WARFARIN SODIUM 4 MG PO TABS
4.0000 mg | ORAL_TABLET | Freq: Once | ORAL | Status: DC
  Filled 2012-10-11: qty 1

## 2012-10-11 NOTE — Progress Notes (Signed)
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 Codeine Phosphate Nausea And Vomiting  . Darvocet (Propoxyphene-Acetaminophen) Nausea And Vomiting  . Promethazine Hcl Nausea And Vomiting  . Sulfamethoxazole Other (See Comments)    Pt doesn't remember reaction  . Amoxicillin Nausea And Vomiting and Rash  . Penicillins Nausea And Vomiting and Rash    Patient Measurements: Height: 5\' 2"  (157.5 cm) Weight: 148 lb 13 oz (67.5 kg) IBW/kg (Calculated) : 50.1   Vital Signs: Temp: 98.3 F (36.8 C) (12/11 0500) Temp src: Oral (12/11 0500) BP: 128/69 mmHg (12/11 0500) Pulse Rate: 58  (12/11 0500)  Labs:  Basename 10/11/12 0505 10/10/12 0635 10/09/12 0520 10/08/12 1148  HGB -- -- 11.4* 11.8*  HCT -- -- 34.6* 35.4*  PLT -- -- 262 247  APTT -- -- -- --  LABPROT 15.3* 14.9 22.6* --  INR 1.23 1.19 2.09* --  HEPARINUNFRC -- -- -- --  CREATININE -- 0.82 0.70 --  CKTOTAL -- -- -- --  CKMB -- -- -- --  TROPONINI -- -- -- --    Estimated Creatinine Clearance: 41.9 ml/min (by C-G formula based on Cr of 0.82).  Assessment: 78 y/o female on Lovenox to Coumadin bridge s/p EGD for hx DVT in 2004 and PE in 2011. No bleeding noted, no CBC today.  EGD showed a possible early esophageal stricture that was dilated.  Goal of Therapy:  INR 2-3 Anti-Xa level 0.6-1.2 units/ml 4hrs after LMWH dose given Monitor platelets by anticoagulation protocol: Yes   Plan:  -Lovenox 70 mg SQ q12h -Coumadin 4 mg po tonight if not discharged -CBC q72h while on Lovenox, INR daily  [facility] [place].D., BCPS Clinical Pharmacist Pager: [phone] 10/11/2012 11:00 AM

## 2012-10-11 NOTE — Progress Notes (Signed)
     [facility] Daily Rounding Note 10/11/2012, 10:33 AM  SUBJECTIVE:       Able to swallow melon, which had caused dysphagia previously.  She feels swallowing has improved post esoph dilation yesterday.   OBJECTIVE:         Vital signs in last 24 hours:    Temp:  [97.7 F (36.5 C)-98.6 F (37 C)] 98.3 F (36.8 C) (12/11 0500) Pulse Rate:  [55-63] 58  (12/11 0500) Resp:  [13-55] 16  (12/11 0500) BP: (128-174)/(61-98) 128/69 mmHg (12/11 0500) SpO2:  [95 %-100 %] 97 % (12/11 0500) Last BM Date: 10/09/12 General: Looks well.   Neuro/Psych:  Relaxed, appropriate  Intake/Output from previous day: 12/10 0701 - 12/11 0700 In: 1891.5 [P.O.:478; I.V.:1413.5] Out: 1100 [Urine:1100]  Intake/Output this shift:    Lab Results:  Basename 10/09/12 0520 10/08/12 1148  WBC 6.2 8.3  HGB 11.4* 11.8*  HCT 34.6* 35.4*  PLT 262 247   BMET  Basename 10/10/12 0635 10/09/12 0520  NA 140 141  K 3.6 3.0*  CL 109 107  CO2 24 24  GLUCOSE 86 86  BUN 3* 3*  CREATININE 0.82 0.70  CALCIUM 8.5 8.6  PT/INR  Basename 10/11/12 0505 10/10/12 0635  LABPROT 15.3* 14.9  INR 1.23 1.19    ASSESMENT: * Dysphagia, esophageal . Dysmotility on esophagram. EGD with dilation of questionable early stricture at GE junction.  * Progressive dementia  * Chronic Coumadin, for 10/2010 PE/DVT. Lovenox and Coumadin resumed 10/10/12 in PM   PLAN: *  GI will sign off. Daily PPI.    LOS: 5 days   Jennye Moccasin  10/11/2012, 10:33 AM Pager: [phone]   I have personally taken an interval history, reviewed the chart, and examined the patient.  I agree with the extender's note, impression and recommendations.  Barbette Hair. Arlyce Dice, MD, [facility] [phone]

## 2012-10-11 NOTE — Progress Notes (Signed)
Speech Language Pathology Dysphagia Treatment Patient Details Name: [name] [name] MRN: [id number] DOB: 05-Dec-1920 Today's Date: 10/11/2012 Time: 1191-4782 SLP Time Calculation (min): 8 min  Assessment / Plan / Recommendation Clinical Impression  Pt. seen for dysphagia treatment during lunch meal.  Pt. underwent EGD yesterday which revealed a stricture in lower esophagus which was stretched during the procedure.  Observed with part of regular meal without oral or pharyngeal impairments.  She stated she does not have sensation of po's in esophagus since procedure.  SLP reviewed esophageal precautions with pt. who verbalized understanding. No f/u ST required.  Pt. scheduled to d/c to SNF today.     Diet Recommendation  Continue with Current Diet: Regular;Thin liquid    SLP Plan All goals met;Discharge SLP treatment due to (comment)       Swallowing Goals  SLP Swallowing Goals Patient will utilize recommended strategies during swallow to increase swallowing safety with: Supervision/safety Swallow Study Goal #2 - Progress: Met  General Temperature Spikes Noted: No Respiratory Status: Room air Behavior/Cognition: Alert;Cooperative;Pleasant mood Oral Cavity - Dentition: Adequate natural dentition Patient Positioning: Upright in chair  Oral Cavity - Oral Hygiene Does patient have any of the following "at risk" factors?: None of the above Brush patient's teeth BID with toothbrush (using toothpaste with fluoride): Yes   Dysphagia Treatment Treatment focused on: Skilled observation of diet tolerance Treatment Methods/Modalities: Skilled observation Patient observed directly with PO's: Yes Type of PO's observed: Regular;Thin liquids Feeding: Able to feed self Liquids provided via: Cup Type of cueing: Verbal Amount of cueing: Minimal        Royce Macadamia M.Ed [employer] [phone]  10/11/2012

## 2012-10-11 NOTE — Discharge Summary (Addendum)
Physician Discharge Summary  [name] [name] JYN:[id number] DOB: 07/07/1921 DOA: 10/06/2012  PCP: [name], MD  Admit date: 10/06/2012 Discharge date: 10/11/2012  Time spent: 40 minutes minutes  Recommendations for Outpatient Follow-up:  1. Followup with primary care physician  Discharge Diagnoses:  Active Problems:  Abscess  Dementia  Dysphagia  Stricture and stenosis of esophagus   Discharge Condition: Stable  Diet recommendation: Regular diet  Filed Weights   10/06/12 1832  Weight: 67.5 kg (148 lb 13 oz)    History of present illness:  [name] [name] is an 78 year old white female with history of CAD, DVT PE on Coumadin, mild dementia, was just discharged from [facility] 12/1 after treatment and I&D of bilateral axillary abscesses from the surgical service.  Through her hospitalization she had worsening of her dysphagia, she was noticed to have trouble swallowing liquids at times, subsequently she was discharged home on Sunday and since then couldn't even take her pills.  In addition she developed constipation and abdominal distention and went back to the ER on Tuesday where she was given a suppository and enema followed by a large bowel movement then sent back home.  Due to progressive nature of her dysphagia her son consulted with the primary care physician and he brought her back to the emergency room yesterday and the ED P. referred the patient to GI for an appointment today, she was seen by Willette Cluster, NP with [facility], she was subsequently sent to the hospital as a direct admit.  In terms of her dysphagia patient feels like fluid and pills and sometimes liquids get stuck in her throat, and now she has no appetite as well, this was originally noticed about 4-5 months ago, cyanosis or little bit of improvement in September but then since November her swallowing has gradually worsened. Patient's son recalls having an endoscopy remotely, for unclear  indication   Hospital Course:   1. Dysphagia: Patient presented to the hospital with dysphagia, worse with solid food. After admission to the hospital gastroenterology was consulted, esophagogram was done and showed no filling defects, EGD was done on the 10th and showed early esophageal stricture, status post: Dilatation, patient feels much better today and was tolerating advancement of her diet. Patient is on Nexium she is to continue that, follow as necessary with [facility] gastroenterology.  2. MRSA axillary abscess: Patient was recently treated for MRSA axillary abscess by incision and drainage done by [facility] surgery. Clindamycin was stopped on 10/09/2012. There is no fever, infection seems to be cleared.  3. Venous thromboembolism: Patient had history of PE in 10/05/2010, she is on Coumadin for that., Coumadin were held at the time of discharge explaining procedures during this hospital stay. Her INR is 1.2 today, she'll be discharged on Lovenox bridge for 5 more days or until INR is greater than 2.0.  4. CAD/hypertension: Reasonable control in this hospital stay for her hypertension, amlodipine was continued throughout the hospital stay with no changes to her home medications.  5. Chronic diarrhea: At baseline no changes were done to her medication.  6. Disposition: Patient exhibited generalized weakness, patient was evaluated by PT/OT and they both recommended skilled nursing facility for acute rehabilitation.  Procedures:  EGD done on 10/10/2012 by Dr. Arlyce Dice: Showed possible early esophageal stricture, status post balloon dilatation and recommended to repeat dilatation when necessary.  Consultations:  [facility] gastroenterology  Discharge Exam: Filed Vitals:   10/10/12 1405 10/10/12 1508 10/10/12 2100 10/11/12 0500  BP: 154/65 174/86  159/68 128/69  Pulse:  55 63 58  Temp:  97.7 F (36.5 C) 98.1 F (36.7 C) 98.3 F (36.8 C)  TempSrc:  Oral Oral Oral  Resp: 17 20 18  16   Height:      Weight:      SpO2: 96% 96% 95% 97%   General: Alert and awake, oriented x3, not in any acute distress. HEENT: anicteric sclera, pupils reactive to light and accommodation, EOMI CVS: S1-S2 clear, no murmur rubs or gallops Chest: clear to auscultation bilaterally, no wheezing, rales or rhonchi Abdomen: soft nontender, nondistended, normal bowel sounds, no organomegaly Extremities: no cyanosis, clubbing or edema noted bilaterally Neuro: Cranial nerves II-XII intact, no focal neurological deficits  Discharge Instructions      Discharge Orders    Future Orders Please Complete By Expires   Increase activity slowly          Medication List     As of 10/11/2012 11:00 AM    STOP taking these medications         clindamycin 300 MG capsule   Commonly known as: CLEOCIN      TAKE these medications         acetaminophen 500 MG tablet   Commonly known as: TYLENOL   Take 500 mg by mouth every 6 (six) hours as needed. For pain      amLODipine 10 MG tablet   Commonly known as: NORVASC   Take 10 mg by mouth every morning.      atorvastatin 20 MG tablet   Commonly known as: LIPITOR   Take 10 mg by mouth every evening.      CHILDRENS CHEWABLE MULTI VITS PO   Take 1 tablet by mouth daily.      cholecalciferol 1000 UNITS tablet   Commonly known as: VITAMIN D   Take 2,000 Units by mouth daily.      donepezil 10 MG tablet   Commonly known as: ARICEPT   Take 10 mg by mouth every morning.      enoxaparin 80 MG/0.8ML injection   Commonly known as: LOVENOX   Inject 0.8 mLs (80 mg total) into the skin every 12 (twelve) hours.      ferrous sulfate 325 (65 FE) MG EC tablet   Take 325 mg by mouth daily with breakfast.      LORazepam 0.5 MG tablet   Commonly known as: ATIVAN   Take 0.25 mg by mouth 3 (three) times daily as needed. for anxiety      losartan 50 MG tablet   Commonly known as: COZAAR   Take 50 mg by mouth at bedtime.      MYRBETRIQ 25 MG Tb24    Generic drug: mirabegron ER   Take 25 mg by mouth every evening.      NEXIUM 40 MG capsule   Generic drug: esomeprazole   Take 40 mg by mouth daily before breakfast.      nitroGLYCERIN 0.4 MG SL tablet   Commonly known as: NITROSTAT   Place 0.4 mg under the tongue every 5 (five) minutes x 3 doses as needed. For chest pain      potassium chloride SA 20 MEQ tablet   Commonly known as: K-DUR,KLOR-CON   Take 20 mEq by mouth daily.      predniSONE 1 MG tablet   Commonly known as: DELTASONE   Take 2 mg by mouth every morning.      rOPINIRole 1 MG tablet   Commonly known as:  REQUIP   Take 0.5-1 mg by mouth 3 (three) times daily. 1 tab at noon, 1/2 tab late afternoon, 1/2 tab one-three hours before bed      traMADol 50 MG tablet   Commonly known as: ULTRAM   Take 0.5 tablets (25 mg total) by mouth every 6 (six) hours as needed for pain. For pain      warfarin 2.5 MG tablet   Commonly known as: COUMADIN   Take 2.5 mg by mouth every evening.      zaleplon 10 MG capsule   Commonly known as: SONATA   Take 1 capsule (10 mg total) by mouth at bedtime. Facility MD to refill           The results of significant diagnostics from this hospitalization (including imaging, microbiology, ancillary and laboratory) are listed below for reference.    Significant Diagnostic Studies: Dg Esophagus  10/08/2012  *RADIOLOGY REPORT*  Clinical Data: 78 year old female with solid food dysphagia.  ESOPHOGRAM/BARIUM SWALLOW  Technique:  Single contrast examination was performed using and barium tablet.  The patient could not tolerate the gas crystals for double contrast evaluation.  Fluoroscopy time:  1.5 minutes.  Comparison:  None  Findings:  The pharynx is unremarkable.  Poor primary esophageal contractions are identified.  There are no fixed filling defects, areas of fixed narrowing, or mucosal abnormalities identified within the esophagus.  The barium tablet passed through the esophagus and into the  stomach without obstruction.  Gastroesophageal reflux was noted during examination. There is no evidence of hiatal hernia.  IMPRESSION: Gastroesophageal reflux.  Nonspecific esophageal motility disorder.  No other significant abnormalities identified.   Original Report Authenticated By: Harmon Pier, M.D.    Dg Abd Acute W/chest  10/03/2012  *RADIOLOGY REPORT*  Clinical Data: Abdominal pain and distention.  Constipation. Anorexia.  ACUTE ABDOMEN SERIES (ABDOMEN 2 VIEW & CHEST 1 VIEW)  Comparison: Chest radiograph on 08/24/2012  Findings: Scattered bowel air fluid levels are seen within both small bowel and colon.  No evidence of dilated bowel loops.  No evidence of free intraperitoneal air.  No radiopaque calculi identified.  Elevation of right hemidiaphragm is stable.  Mild bibasilar scarring also unchanged.  No evidence of acute infiltrate or edema. No evidence of pleural effusion.  Heart size is within normal limits.  No mass or lymphadenopathy identified.  IMPRESSION:  1.  Nonspecific, nonobstructive bowel gas pattern. 2.  No active cardiopulmonary disease.   Original Report Authenticated By: Myles Rosenthal, M.D.     Microbiology: No results found for this or any previous visit (from the past 240 hour(s)).   Labs: Basic Metabolic Panel:  Lab 10/10/12 8295 10/09/12 0520 10/07/12 0555 10/06/12 1805 10/05/12 2036  NA 140 141 137 -- 139  K 3.6 3.0* 3.6 -- 4.0  CL 109 107 104 -- 104  CO2 24 24 21  -- 23  GLUCOSE 86 86 83 -- 101*  BUN 3* 3* 7 -- 7  CREATININE 0.82 0.70 0.93 0.88 0.84  CALCIUM 8.5 8.6 8.4 -- 9.5  MG -- -- -- -- --  PHOS -- -- -- -- --   Liver Function Tests:  Lab 10/07/12 0555 10/05/12 2036  AST 13 18  ALT 8 10  ALKPHOS 49 59  BILITOT 0.3 0.2*  PROT 5.6* 6.7  ALBUMIN 2.9* 3.6   No results found for this basename: LIPASE:5,AMYLASE:5 in the last 168 hours No results found for this basename: AMMONIA:5 in the last 168 hours CBC:  Lab 10/09/12 0520  10/08/12 1148 10/07/12 0555  10/06/12 1805 10/05/12 2036  WBC 6.2 8.3 6.0 6.7 6.5  NEUTROABS -- -- -- -- 3.9  HGB 11.4* 11.8* 11.1* 12.9 12.4  HCT 34.6* 35.4* 34.9* 39.5 37.7  MCV 88.3 88.7 87.5 86.6 88.1  PLT 262 247 253 254 256   Cardiac Enzymes: No results found for this basename: CKTOTAL:5,CKMB:5,CKMBINDEX:5,TROPONINI:5 in the last 168 hours BNP: BNP (last 3 results)  Basename 06/09/12 1054 03/12/12 1351 03/02/12 1423  PROBNP 245.0 101.6 105.4   CBG: No results found for this basename: GLUCAP:5 in the last 168 hours     Signed:  Ave Scharnhorst A  [facility] 10/11/2012, 11:00 AM

## 2012-10-11 NOTE — Care Management Note (Signed)
    Page 1 of 1   10/11/2012     2:38:55 PM   CARE MANAGEMENT NOTE 10/11/2012  Patient:  [name] [name], [name] [name]   Account Number:  [id number]  Date Initiated:  10/11/2012  Documentation initiated by:  Letha Cape  Subjective/Objective Assessment:   dx abscess, dysphagia  admit     Action/Plan:   Anticipated DC Date:  10/11/2012   Anticipated DC Plan:  SKILLED NURSING FACILITY  In-house referral  Clinical Social Worker      DC Planning Services  CM consult      Choice offered to / List presented to:             Status of service:  Completed, signed off Medicare Important Message given?   (If response is "NO", the following Medicare IM given date fields will be blank) Date Medicare IM given:   Date Additional Medicare IM given:    Discharge Disposition:  SKILLED NURSING FACILITY  Per UR Regulation:  Reviewed for med. necessity/level of care/duration of stay  If discussed at Long Length of Stay Meetings, dates discussed:    Comments:  10/11/12 14:37 Letha Cape RN, BSN [phone] patient is for dc to [facility] today, CSW following.

## 2012-10-11 NOTE — Progress Notes (Signed)
Patient evaluated for long-term disease management services with [facility] Care Management Program as a benefit of her [employer]. Explained services and that she will likely receive a post discharge transition of care call when she returns from SNF to home. Mrs Iversen reports she plans to go to [facility] at discharge. Left contact and [facility] Care Management information at bedside. Appreciative of visit.      Raiford Noble, [facility] Care Management [facility] Liaison [phone]

## 2012-10-11 NOTE — Progress Notes (Signed)
Physical Therapy Treatment Patient Details Name: [name] [name] MRN: [id number] DOB: [birth year] Today's Date: 10/11/2012 Time: 4098-1191 PT Time Calculation (min): 15 min  PT Assessment / Plan / Recommendation Comments on Treatment Session  Pt adm with dysphagia.  Pt making steady progress.    Follow Up Recommendations  SNF     Does the patient have the potential to tolerate intense rehabilitation     Barriers to Discharge        Equipment Recommendations  None recommended by PT    Recommendations for Other Services    Frequency Min 2X/week   Plan Discharge plan remains appropriate;Frequency needs to be updated    Precautions / Restrictions Precautions Precautions: Fall   Pertinent Vitals/Pain N/A    Mobility  Transfers Sit to Stand: 4: Min assist;With upper extremity assist;With armrests;From chair/3-in-1 Stand to Sit: 4: Min assist;With upper extremity assist;With armrests;To chair/3-in-1 Details for Transfer Assistance: Verbal cues for hand placement Ambulation/Gait Ambulation/Gait Assistance: 4: Min assist Ambulation Distance (Feet): 170 Feet Assistive device: Rolling walker Ambulation/Gait Assistance Details: verbal cues to stand more erect and stay closer to walker. Gait Pattern: Step-through pattern;Decreased stride length;Trunk flexed Gait velocity: decr    Exercises     PT Diagnosis:    PT Problem List:   PT Treatment Interventions:     PT Goals Acute Rehab PT Goals PT Goal: Sit to Stand - Progress: Progressing toward goal PT Goal: Stand to Sit - Progress: Progressing toward goal PT Goal: Ambulate - Progress: Progressing toward goal  Visit Information  Last PT Received On: 10/11/12 Assistance Needed: +1    Subjective Data  Subjective: No c/o's   Cognition  Overall Cognitive Status: Appears within functional limits for tasks assessed/performed Arousal/Alertness: Awake/alert Orientation Level: Appears intact for tasks assessed Behavior During  Session: [facility] for tasks performed    Balance  Static Standing Balance Static Standing - Balance Support: Bilateral upper extremity supported (on walker) Static Standing - Level of Assistance: 5: Stand by assistance  End of Session PT - End of Session Equipment Utilized During Treatment: Gait belt Activity Tolerance: Patient tolerated treatment well Patient left: in chair;with call bell/phone within reach;with family/visitor present Nurse Communication: Mobility status   GP     [name] [name] 10/11/2012, 12:35 PM  [employer] PT [phone]

## 2012-11-16 ENCOUNTER — Encounter (INDEPENDENT_AMBULATORY_CARE_PROVIDER_SITE_OTHER): Payer: Self-pay | Admitting: General Surgery

## 2012-11-16 ENCOUNTER — Ambulatory Visit (INDEPENDENT_AMBULATORY_CARE_PROVIDER_SITE_OTHER): Payer: Medicare Other | Admitting: General Surgery

## 2012-11-16 VITALS — BP 112/72 | HR 60 | Temp 96.5°F | Resp 16 | Ht 62.0 in | Wt 150.8 lb

## 2012-11-16 DIAGNOSIS — L02419 Cutaneous abscess of limb, unspecified: Secondary | ICD-10-CM

## 2012-11-16 DIAGNOSIS — IMO0002 Reserved for concepts with insufficient information to code with codable children: Secondary | ICD-10-CM

## 2012-11-16 MED ORDER — DOXYCYCLINE HYCLATE 100 MG PO TABS: 100.0000 mg | ORAL_TABLET | Freq: Two times a day (BID) | ORAL | Status: DC

## 2012-11-16 NOTE — Patient Instructions (Addendum)
Clean area with Hibiclens soap once a day for 7 days. Rinse area with warm water twice a day.  Applied a dry dressing after each cleaning. Call if the area gets worse. Take the doxycycline for 6 weeks.Have INR checked in 6-7 days.

## 2012-11-16 NOTE — Progress Notes (Signed)
Subjective:     Patient ID: [name] [name], female   DOB: [birth year], 79 y.o.   MRN: [id number]  HPI  She is here because of the onset of a new right axillary abscess. She has a history of bilateral axillary abscesses. She's been operated on twice in the past 4 months for these with the last time being on Thanksgiving day. It is MRSA.  She is on chronic Coumadin. She also takes prednisone.   Review of SystemsNo left axillary bronchus.     Objective:   Physical Exam Gen.-elderly female in no acute distress.  Musculoskeletal-multiple left axillary scars. Right axilla demonstrates a 8 mm fluctuant area with cement duration around it. Besides manipulation led to spontaneous drainage of purulent fluid. It was then cleaned with peroxide.    Assessment:     Recurrent right axillary abscess-she is on steroids which increases her risk of recurrence. I was able to drain this without doing a formal incision and drainage. This is her third episode.    Plan:     Doxycycline for 6 weeks. Clean area with Hibiclens soap daily for one week. Irrigate area with warm water twice a day and apply dry bandage.  Return visit as needed to check on area. I did explain to her and her son that because of the steroids this area can take a long time to heal.

## 2012-11-27 ENCOUNTER — Encounter (INDEPENDENT_AMBULATORY_CARE_PROVIDER_SITE_OTHER): Payer: Medicare Other | Admitting: General Surgery

## 2013-01-22 ENCOUNTER — Encounter (HOSPITAL_COMMUNITY): Payer: Self-pay | Admitting: Cardiology

## 2013-01-22 ENCOUNTER — Emergency Department (HOSPITAL_COMMUNITY)
Admission: EM | Admit: 2013-01-22 | Discharge: 2013-01-23 | Disposition: A | Discharge status: Home / Self Care | Payer: Medicare Other | Attending: Emergency Medicine | Admitting: Emergency Medicine

## 2013-01-22 DIAGNOSIS — R110 Nausea: Secondary | ICD-10-CM | POA: Insufficient documentation

## 2013-01-22 DIAGNOSIS — Z8709 Personal history of other diseases of the respiratory system: Secondary | ICD-10-CM | POA: Insufficient documentation

## 2013-01-22 DIAGNOSIS — Z86711 Personal history of pulmonary embolism: Secondary | ICD-10-CM | POA: Insufficient documentation

## 2013-01-22 DIAGNOSIS — K219 Gastro-esophageal reflux disease without esophagitis: Secondary | ICD-10-CM | POA: Insufficient documentation

## 2013-01-22 DIAGNOSIS — N390 Urinary tract infection, site not specified: Secondary | ICD-10-CM

## 2013-01-22 DIAGNOSIS — R6883 Chills (without fever): Secondary | ICD-10-CM | POA: Insufficient documentation

## 2013-01-22 DIAGNOSIS — Z7901 Long term (current) use of anticoagulants: Secondary | ICD-10-CM | POA: Insufficient documentation

## 2013-01-22 DIAGNOSIS — F0390 Unspecified dementia without behavioral disturbance: Secondary | ICD-10-CM | POA: Insufficient documentation

## 2013-01-22 DIAGNOSIS — IMO0001 Reserved for inherently not codable concepts without codable children: Secondary | ICD-10-CM | POA: Insufficient documentation

## 2013-01-22 DIAGNOSIS — Z79899 Other long term (current) drug therapy: Secondary | ICD-10-CM | POA: Insufficient documentation

## 2013-01-22 DIAGNOSIS — I2510 Atherosclerotic heart disease of native coronary artery without angina pectoris: Secondary | ICD-10-CM | POA: Insufficient documentation

## 2013-01-22 DIAGNOSIS — Z872 Personal history of diseases of the skin and subcutaneous tissue: Secondary | ICD-10-CM | POA: Insufficient documentation

## 2013-01-22 DIAGNOSIS — IMO0002 Reserved for concepts with insufficient information to code with codable children: Secondary | ICD-10-CM | POA: Insufficient documentation

## 2013-01-22 DIAGNOSIS — Z8739 Personal history of other diseases of the musculoskeletal system and connective tissue: Secondary | ICD-10-CM | POA: Insufficient documentation

## 2013-01-22 DIAGNOSIS — Z8719 Personal history of other diseases of the digestive system: Secondary | ICD-10-CM | POA: Insufficient documentation

## 2013-01-22 DIAGNOSIS — Z9861 Coronary angioplasty status: Secondary | ICD-10-CM | POA: Insufficient documentation

## 2013-01-22 DIAGNOSIS — Z86718 Personal history of other venous thrombosis and embolism: Secondary | ICD-10-CM | POA: Insufficient documentation

## 2013-01-22 DIAGNOSIS — F411 Generalized anxiety disorder: Secondary | ICD-10-CM | POA: Insufficient documentation

## 2013-01-22 DIAGNOSIS — Z85038 Personal history of other malignant neoplasm of large intestine: Secondary | ICD-10-CM | POA: Insufficient documentation

## 2013-01-22 DIAGNOSIS — Z8669 Personal history of other diseases of the nervous system and sense organs: Secondary | ICD-10-CM | POA: Insufficient documentation

## 2013-01-22 DIAGNOSIS — I10 Essential (primary) hypertension: Secondary | ICD-10-CM | POA: Insufficient documentation

## 2013-01-22 DIAGNOSIS — R300 Dysuria: Secondary | ICD-10-CM | POA: Insufficient documentation

## 2013-01-22 DIAGNOSIS — R109 Unspecified abdominal pain: Secondary | ICD-10-CM | POA: Insufficient documentation

## 2013-01-22 LAB — CBC WITH DIFFERENTIAL/PLATELET
Basophils Absolute: 0 10*3/uL (ref 0.0–0.1)
Eosinophils Relative: 0 % (ref 0–5)
HCT: 40.3 % (ref 36.0–46.0)
Lymphocytes Relative: 5 % — ABNORMAL LOW (ref 12–46)
MCH: 29.2 pg (ref 26.0–34.0)
MCHC: 33 g/dL (ref 30.0–36.0)
MCV: 88.6 fL (ref 78.0–100.0)
Monocytes Absolute: 1.1 10*3/uL — ABNORMAL HIGH (ref 0.1–1.0)
RDW: 13.8 % (ref 11.5–15.5)

## 2013-01-22 LAB — POCT I-STAT, CHEM 8
BUN: 11 mg/dL (ref 6–23)
Calcium, Ion: 1.18 mmol/L (ref 1.13–1.30)
TCO2: 23 mmol/L (ref 0–100)

## 2013-01-22 MED ORDER — CIPROFLOXACIN IN D5W 400 MG/200ML IV SOLN
400.0000 mg | Freq: Once | INTRAVENOUS | Status: AC
  Administered 2013-01-22: 400 mg via INTRAVENOUS
  Filled 2013-01-22: qty 200

## 2013-01-22 MED ORDER — CIPROFLOXACIN HCL 250 MG PO TABS: 250.0000 mg | ORAL_TABLET | Freq: Two times a day (BID) | ORAL | Status: DC

## 2013-01-22 MED ORDER — SODIUM CHLORIDE 0.9 % IV BOLUS (SEPSIS)
1000.0000 mL | Freq: Once | INTRAVENOUS | Status: AC
  Administered 2013-01-22: 1000 mL via INTRAVENOUS

## 2013-01-22 NOTE — ED Notes (Signed)
Unsuccessful IV starts x 2. IV team paged for IV start

## 2013-01-22 NOTE — ED Provider Notes (Signed)
History     CSN: [id number]  Arrival date & time 01/22/13  1803  Chief Complaint  Patient presents with  . Urinary Tract Infection   HPI  79 y/o female with history as noted below who presents with cc of dysuria, nausea, and chills. The patient states her symptoms began yesterday with dysuria. She states she has had several episodes of dysuria. Today she was diagnosed with a UTI by her PCP by a urine sample. The patient called EMS because she was feeling nauseated and had associated chills. She denies any additional symptoms. She has not gotten her prescription filled yet.   Past Medical History  Diagnosis Date  . CAD (coronary artery disease)     S/p PTCA / stenting (last cath 2004, multiple LAD stents, 2 stents in the right coronary artery all patent)  . 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 Started coumadin  . HTN (hypertension)   . Restless leg syndrome   . PMR (polymyalgia rheumatica)   . Axillary abscess   . Anxiety   . Dementia   . Cancer   . Complication of anesthesia   . PONV (postoperative nausea and vomiting)   . Shortness of breath   . GERD (gastroesophageal reflux disease)     Past Surgical History  Procedure Laterality Date  . Colectomy    . Breast lumpectomy    . Stent      cardiac x 8 stents.  . Incision and drainage      bilateral axillary, non specific staff  . Incision and drainage abscess  09/28/2012    Procedure: INCISION AND DRAINAGE ABSCESS;  Surgeon: Liz Malady, MD;  Location: [facility] OR;  Service: General;  Laterality: Bilateral;  . Esophagogastroduodenoscopy (egd) with esophageal dilation  10/10/2012    Procedure: ESOPHAGOGASTRODUODENOSCOPY (EGD) WITH ESOPHAGEAL DILATION;  Surgeon: Louis Meckel, MD;  Location: [facility] ENDOSCOPY;  Service: Endoscopy;  Laterality: N/A;    History reviewed. No pertinent family history.  History  Substance Use Topics  . Smoking status: Never Smoker   . Smokeless tobacco: Never Used  . Alcohol Use:  No    OB History   Grav Para Term Preterm Abortions TAB SAB Ect Mult Living                 Review of Systems  Constitutional: Positive for chills. Negative for fever.  HENT: Negative for congestion and rhinorrhea.   Respiratory: Negative for cough and shortness of breath.   Cardiovascular: Negative for chest pain.  Gastrointestinal: Positive for nausea. Negative for vomiting, abdominal pain, diarrhea and constipation.  Genitourinary: Positive for dysuria.  Musculoskeletal: Positive for myalgias.  Skin: Negative for rash.  All other systems reviewed and are negative.   Allergies  Codeine phosphate; Darvocet; Promethazine hcl; Sulfamethoxazole; Vicodin; Amoxicillin; and Penicillins  Home Medications   Current Outpatient Rx  Name  Route  Sig  Dispense  Refill  . acetaminophen (TYLENOL) 500 MG tablet   Oral   Take 1,000 mg by mouth 3 (three) times daily as needed for pain. For pain         . amLODipine (NORVASC) 10 MG tablet   Oral   Take 10 mg by mouth every morning.          Marland Kitchen atorvastatin (LIPITOR) 20 MG tablet   Oral   Take 10 mg by mouth every evening.          . cholecalciferol (VITAMIN D) 1000 UNITS tablet  Oral   Take 2,000 Units by mouth daily.         . cholestyramine light (PREVALITE) 4 G packet   Oral   Take 4 g by mouth daily as needed (for loose bowels).         . donepezil (ARICEPT) 10 MG tablet   Oral   Take 10 mg by mouth every morning.         Marland Kitchen esomeprazole (NEXIUM) 40 MG capsule   Oral   Take 40 mg by mouth 2 (two) times daily as needed (for acid reflux).         . ferrous sulfate 325 (65 FE) MG EC tablet   Oral   Take 325 mg by mouth daily with breakfast.         . LORazepam (ATIVAN) 0.5 MG tablet   Oral   Take 0.25 mg by mouth 3 (three) times daily as needed. for anxiety         . losartan (COZAAR) 50 MG tablet   Oral   Take 50 mg by mouth at bedtime.          . mirabegron ER (MYRBETRIQ) 25 MG TB24   Oral    Take 25 mg by mouth every evening.          . nitroGLYCERIN (NITROSTAT) 0.4 MG SL tablet   Sublingual   Place 0.4 mg under the tongue every 5 (five) minutes x 3 doses as needed. For chest pain         . Pediatric Multiple Vit-C-FA (CHILDRENS CHEWABLE MULTI VITS PO)   Oral   Take 1 tablet by mouth daily.         . potassium chloride SA (K-DUR,KLOR-CON) 20 MEQ tablet   Oral   Take 20 mEq by mouth daily.         . predniSONE (DELTASONE) 1 MG tablet   Oral   Take 2 mg by mouth every morning.          Marland Kitchen rOPINIRole (REQUIP) 1 MG tablet   Oral   Take 0.5-1 mg by mouth 3 (three) times daily. 1 tab at noon, 1/2 tab late afternoon, 1/2 tab one-three hours before bed         . warfarin (COUMADIN) 2.5 MG tablet   Oral   Take 2.5 mg by mouth every evening.          . zaleplon (SONATA) 10 MG capsule   Oral   Take 1 capsule (10 mg total) by mouth at bedtime. Facility MD to refill   10 capsule   0   . ciprofloxacin (CIPRO) 250 MG tablet   Oral   Take 1 tablet (250 mg total) by mouth every 12 (twelve) hours.   14 tablet   0     BP 144/63  Pulse 68  Temp(Src) 98.5 F (36.9 C) (Oral)  Resp 25  SpO2 96%  Physical Exam  Nursing note and vitals reviewed. Constitutional: She is oriented to person, place, and time. She appears well-developed and well-nourished. No distress.  HENT:  Head: Normocephalic and atraumatic.  Eyes: Conjunctivae are normal. Pupils are equal, round, and reactive to light.  Neck: Normal range of motion. Neck supple.  Cardiovascular: Normal rate and regular rhythm.  Exam reveals no gallop and no friction rub.   No murmur heard. Pulmonary/Chest: Effort normal and breath sounds normal.  Abdominal: Soft. She exhibits no distension. There is tenderness (mild) in the suprapubic area. There is no CVA  tenderness.  Musculoskeletal: Normal range of motion. She exhibits no edema and no tenderness.  Neurological: She is alert and oriented to person, place,  and time.  Skin: Skin is warm and dry.  Psychiatric: She has a normal mood and affect.    ED Course  Procedures (including critical care time)  Labs Reviewed  CBC WITH DIFFERENTIAL - Abnormal; Notable for the following:    WBC 15.1 (*)    Neutrophils Relative 88 (*)    Neutro Abs 13.2 (*)    Lymphocytes Relative 5 (*)    Monocytes Absolute 1.1 (*)    All other components within normal limits  POCT I-STAT, CHEM 8 - Abnormal; Notable for the following:    Glucose, Bld 104 (*)    All other components within normal limits   No results found.  1. Urinary tract infection    MDM   79 y/o female with history as noted below who presents with cc of dysuria, nausea, and chills. Afebrile. Well appearing. HDS. No evidence of sepsis. Mild leukocytosis of 15.1. Results from UA performed at PCP's office earlier today reviewed and c/w likely UTI. Doubt pyleo. Likely UTI. First dose of cirpo given IV here. The patient was discharged home with instructions to follow up with her pcp in the next 1-2 days if not better. Return precautions including any worsening of symptoms or persistent vomiting given to the patient as well as her son who were both in agreement with the plan.       Shanon Ace, MD 01/23/13 [phone]

## 2013-01-22 NOTE — ED Notes (Signed)
Pt to department from home- pt reports she dx with a UTI today and the MD office did not give her any medications. Denies any urinary symptoms at this time. States her MD office told them to call EMS for transport. No other symptoms. 4mg  Zofran IM. Bp-150/96 HR-72

## 2013-01-26 NOTE — ED Provider Notes (Signed)
  I performed a history and physical examination of [name] [name] and discussed her management with Dr. Dr. Ethelene Browns.  I agree with the history, physical, assessment, and plan of care, with the following exceptions: None  I was present for the following procedures: None Time Spent in Critical Care of the patient: None Time spent in discussions with the patient and family: 2  Clarice Zulauf Corlis Leak, MD 01/26/13 [phone]

## 2013-02-20 ENCOUNTER — Ambulatory Visit (INDEPENDENT_AMBULATORY_CARE_PROVIDER_SITE_OTHER): Payer: Medicare Other | Admitting: General Surgery

## 2013-02-20 ENCOUNTER — Encounter (INDEPENDENT_AMBULATORY_CARE_PROVIDER_SITE_OTHER): Payer: Self-pay | Admitting: General Surgery

## 2013-02-20 VITALS — BP 130/78 | HR 56 | Temp 98.3°F | Resp 18 | Ht 62.0 in | Wt 148.6 lb

## 2013-02-20 DIAGNOSIS — L0291 Cutaneous abscess, unspecified: Secondary | ICD-10-CM

## 2013-02-20 NOTE — Progress Notes (Signed)
Subjective:     Patient ID: [name] [name], female   DOB: 03/22/1921, 79 y.o.   MRN: [id number]  HPI 79 year old Caucasian female comes in with complaints of pain and swelling around her left labia majora. Apparently a boil was noticed late last week. It started bothering her over the weekend. 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 She saw her primary care physician yesterday who put her on doxycycline twice a day as well as the nasal ointment because of her history of MRSA abscesses. She does take Coumadin.. No fevers or chills PMHx, PSHx, SOCHx, FAMHx, ALL reviewed   Review of Systems 6 point ROS performed negative excpet for above.     Objective:   Physical Exam BP 130/78  Pulse 56  Temp(Src) 98.3 F (36.8 C) (Temporal)  Resp 18  Ht 5\' 2"  (1.575 m)  Wt 148 lb 9.6 oz (67.405 kg)  BMI 27.17 kg/m2 Alert, nad; pleasant confused Soft, nt, nd GU - left labia majora - prob about a 3 x2 cm area of induration and cellulitis.TTP. Small opening present. No fluctuance    Assessment:     Left labia majora abscess     Plan:     This is consistent with an abscess. It does not appear to have a lot of purulent fluid however there is significant induration and inflammation. I recommended infiltrating some local around the opening and  opening up the small sinus a little bit larger.   After obtaining verbal consent, 3 cc of 2% Xylocaine with epinephrine was infiltrated around the area of maximal induration. It into a hemostat and split open the small opening further dictate a 2 cm opening. There is minimal bleeding. A dry gauze was placed on top.  I discussed wound care with her caregiver. She is to finish the antibiotics that her primary care physician put her on. I advised him there may be some bloody drainage for the next day or 2. Followup with me in about 2 weeks for wound check.  Mary Sella. Andrey Campanile, MD, FACS General, Bariatric, & Minimally Invasive Surgery [facility]  Surgery, [place]

## 2013-02-20 NOTE — Patient Instructions (Signed)
Expect some bloody drainage for several days Cover with dry gauze or pad. Try to area clean Finish antibiotic as prescribed Can take ultram (tramadol) as prescribed

## 2013-02-23 ENCOUNTER — Encounter (INDEPENDENT_AMBULATORY_CARE_PROVIDER_SITE_OTHER): Payer: Medicare Other | Admitting: General Surgery

## 2013-03-06 ENCOUNTER — Encounter (INDEPENDENT_AMBULATORY_CARE_PROVIDER_SITE_OTHER): Payer: Medicare Other | Admitting: General Surgery

## 2013-04-12 ENCOUNTER — Encounter (INDEPENDENT_AMBULATORY_CARE_PROVIDER_SITE_OTHER): Payer: Self-pay | Admitting: Surgery

## 2013-04-12 ENCOUNTER — Ambulatory Visit (INDEPENDENT_AMBULATORY_CARE_PROVIDER_SITE_OTHER): Payer: Medicare Other | Admitting: Surgery

## 2013-04-12 VITALS — BP 106/72 | HR 64 | Temp 98.0°F | Resp 15 | Ht 61.0 in | Wt 148.0 lb

## 2013-04-12 DIAGNOSIS — L03317 Cellulitis of buttock: Secondary | ICD-10-CM | POA: Insufficient documentation

## 2013-04-12 DIAGNOSIS — L0231 Cutaneous abscess of buttock: Secondary | ICD-10-CM

## 2013-04-12 MED ORDER — DOXYCYCLINE HYCLATE 100 MG PO TABS: 100.0000 mg | ORAL_TABLET | Freq: Two times a day (BID) | ORAL | Status: DC

## 2013-04-12 MED ORDER — ONDANSETRON HCL 4 MG PO TABS: 4.0000 mg | ORAL_TABLET | Freq: Three times a day (TID) | ORAL | Status: DC | PRN

## 2013-04-12 NOTE — Addendum Note (Signed)
Addended by: Ardeth Sportsman on: 04/12/2013 03:57 PM   Modules accepted: Orders

## 2013-04-12 NOTE — Patient Instructions (Addendum)
WOUND CARE  It is important that the wound be kept open.   -Keeping the skin edges apart will allow the wound to gradually heal from the base upwards.   - If the skin edges of the wound close too early, a new fluid pocket can form and infection can occur. -This is the reason to pack deeper wounds with gauze or ribbon -This is why drained wounds cannot be sewed closed right away  A healthy wound should form a lining of bright red "beefy" granulating tissue that will help shrink the wound and help the edges grow new skin into it.   -A little mucus / yellow discharge is normal (the body's natural way to try and form a scab) and should be gently washed off with soap and water with daily dressing changes.  -Green or foul smelling drainage implies bacterial colonization and can slow wound healing - a short course of antibiotic ointment (3-5 days) can help it clear up.  Call the doctor if it does not improve or worsens  -Avoid use of antibiotic ointments for more than a week as they can slow wound healing over time.    -Sometimes other wound care products will be used to reduce need for dressing changes and/or help clean up dirty wounds -Sometimes the surgeon needs to debride the wound in the office to remove dead or infected tissue out of the wound so it can heal more quickly and safely.    Change the dressing at least once a day -Wash the wound with mild soap and water gently every day.  It is good to shower or bathe the wound to help it clean out. -Use clean 4x4 gauze for medium/large wounds or ribbon plain NU-gauze for smaller wounds (it does not need to be sterile, just clean) -Keep the raw wound moist with a little saline or KY (saline) gel on the gauze.  -A dry wound will take longer to heal.  -Keep the skin dry around the wound to prevent breakdown and irritation. -Pack the wound down to the base -The goal is to keep the skin apart, not overpack the wound -Use a Q-tip or blunt-tipped kabob  stick toothpick to push the gauze down to the base in narrow or deep wounds   -Cover with a clean gauze and tape -paper or Medipore tape tend to be gentle on the skin -rotate the orientation of the tape to avoid repeated stress/trauma on the skin -using an ACE or Coban wrap on wounds on arms or legs can be used instead.  Complete all antibiotics through the entire prescription to help the infection heal and prevent new places of infection   Returning the see the surgeon is helpful to follow the healing process and help the wound close as fast as possible.  Managing Pain  Pain after surgery or related to activity is often due to strain/injury to muscle, tendon, nerves and/or incisions.  This pain is usually short-term and will improve in a few months.   Many people find it helpful to do the following things TOGETHER to help speed the process of healing and to get back to regular activity more quickly:  1. Avoid heavy physical activity a.  no lifting greater than 20 pounds b. Do not "push through" the pain.  Listen to your body and avoid positions and maneuvers than reproduce the pain c. Walking is okay as tolerated, but go slowly and stop when getting sore.  d. Remember: If it hurts to do  it, then don't do it! 2. Take Anti-inflammatory medication  a. Take with food/snack around the clock for 1-2 weeks i. This helps the muscle and nerve tissues become less irritable and calm down faster ii. Choose Acetaminophen 500mg  tabs (Tylenol) 1-2 pills with every meal and just before bedtime 3. Use a Heating pad or Ice/Cold Pack a. 4-6 times a day b. May use warm bath/hottub  or showers 4. Try Gentle Massage and/or Stretching  a. at the area of pain many times a day b. stop if you feel pain - do not overdo it  Try these steps together to help you body heal faster and avoid making things get worse.  Doing just one of these things may not be enough.    If you are not getting better after two weeks  or are noticing you are getting worse, contact our office for further advice; we may need to re-evaluate you & see what other things we can do to help.

## 2013-04-12 NOTE — Progress Notes (Signed)
Subjective:     Patient ID: [name] [name], female   DOB: 1921-01-03, 79 y.o.   MRN: [id number]  HPI  [name] [name]  May 22, 1921 [id number]  Patient Care Team: [name], MD as PCP - General  This patient is a 79 y.o.female who presents today for surgical evaluation at the request of Dr. Renne Crigler.   Reason for visit: Left buttock abscess  Elderly woman with a history of MRSA infections.  Fully anticoagulated on warfarin.  On steroids for PMR.   Has had multiple drainages done in the past.  January April of this year.  Now complains of boil on left buttock.  Patient Active Problem List   Diagnosis Date Noted  . Stricture and stenosis of esophagus 10/10/2012  . Dementia 10/06/2012  . Dysphagia 10/06/2012  . Axillary abscess - bilateral, multiple 09/27/2012  . Hypotension 08/24/2012  . Near syncope 08/24/2012  . Abscess of axilla, left 06/27/2012  . Bradycardia 06/13/2012  . Abscess 06/11/2012  . Chest pain 06/09/2012  . Cellulitis 06/09/2012  . Weakness of both legs 06/09/2012  . PMR (polymyalgia rheumatica)   . Restless leg syndrome   . Angina pectoris, unstable 03/02/2012  . Anemia 03/02/2012  . Dyspnea 02/03/2012  . UTI (urinary tract infection) 02/03/2012  . CAD (coronary artery disease)   . HTN (hypertension)   . VTE (venous thromboembolism) 10/01/2010  . HYPERCHOLESTEROLEMIA  IIA 08/27/2009  . HYPERLIPIDEMIA-MIXED 12/11/2008  . HYPERTENSION, BENIGN 12/11/2008    Past Medical History  Diagnosis Date  . CAD (coronary artery disease)     S/p PTCA / stenting (last cath 2004, multiple LAD stents, 2 stents in the right coronary artery all patent)  . 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 Started coumadin  . HTN (hypertension)   . Restless leg syndrome   . PMR (polymyalgia rheumatica)   . Axillary abscess   . Anxiety   . Dementia   . Cancer   . Complication of anesthesia   . PONV (postoperative nausea and vomiting)   . Shortness of  breath   . GERD (gastroesophageal reflux disease)     Past Surgical History  Procedure Laterality Date  . Colectomy    . Breast lumpectomy    . Stent      cardiac x 8 stents.  . Incision and drainage      bilateral axillary, non specific staff  . Incision and drainage abscess  09/28/2012    Procedure: INCISION AND DRAINAGE ABSCESS;  Surgeon: Liz Malady, MD;  Location: [facility] OR;  Service: General;  Laterality: Bilateral;  . Esophagogastroduodenoscopy (egd) with esophageal dilation  10/10/2012    Procedure: ESOPHAGOGASTRODUODENOSCOPY (EGD) WITH ESOPHAGEAL DILATION;  Surgeon: Louis Meckel, MD;  Location: [facility] ENDOSCOPY;  Service: Endoscopy;  Laterality: N/A;    History   Social History  . Marital Status: Widowed    Spouse Name: N/A    Number of Children: N/A  . Years of Education: N/A   Occupational History  . retired    Social History Main Topics  . Smoking status: Never Smoker   . Smokeless tobacco: Never Used  . Alcohol Use: No  . Drug Use: No  . Sexually Active:    Other Topics Concern  . Not on file   Social History Narrative   She lives at home with her son and is still ambulatory daily with a cane or walker. She denies any history of smoking. Marland Kitchen.Mother died at 66 from a  myocardial infarction.  Father    died at 75 from a stroke, possible history of PE. She has nine brothers and    sisters, one sister with bypass and a brother with bypass, also has a    brother who died abruptly of a pulmonary embolism.          History reviewed. No pertinent family history.  Current Outpatient Prescriptions  Medication Sig Dispense Refill  . acetaminophen (TYLENOL) 500 MG tablet Take 1,000 mg by mouth 3 (three) times daily as needed for pain. For pain      . amLODipine (NORVASC) 10 MG tablet Take 10 mg by mouth every morning.       Marland Kitchen atorvastatin (LIPITOR) 20 MG tablet Take 10 mg by mouth every evening.       . calcitonin, salmon, (MIACALCIN/FORTICAL) 200 UNIT/ACT nasal  spray Place 1 spray into the nose daily.      . cholecalciferol (VITAMIN D) 1000 UNITS tablet Take 2,000 Units by mouth daily.      . cholestyramine light (PREVALITE) 4 G packet Take 4 g by mouth daily as needed (for loose bowels).      . donepezil (ARICEPT) 10 MG tablet Take 10 mg by mouth every morning.      Marland Kitchen esomeprazole (NEXIUM) 40 MG capsule Take 40 mg by mouth 2 (two) times daily as needed (for acid reflux).      . LORazepam (ATIVAN) 0.5 MG tablet Take 0.25 mg by mouth 3 (three) times daily as needed. for anxiety      . losartan (COZAAR) 50 MG tablet Take 50 mg by mouth at bedtime.       . mirabegron ER (MYRBETRIQ) 25 MG TB24 Take 25 mg by mouth every evening.       . mupirocin ointment (BACTROBAN) 2 % Apply topically 3 (three) times daily.      . Pediatric Multiple Vit-C-FA (CHILDRENS CHEWABLE MULTI VITS PO) Take 1 tablet by mouth daily.      . potassium chloride SA (K-DUR,KLOR-CON) 20 MEQ tablet Take 20 mEq by mouth daily.      . predniSONE (DELTASONE) 1 MG tablet Take 2 mg by mouth every morning.       Marland Kitchen rOPINIRole (REQUIP) 1 MG tablet Take 0.5-1 mg by mouth 3 (three) times daily. 1 tab at noon, 1/2 tab late afternoon, 1/2 tab one-three hours before bed      . warfarin (COUMADIN) 2.5 MG tablet Take 2.5 mg by mouth every evening.       . zaleplon (SONATA) 10 MG capsule Take 1 capsule (10 mg total) by mouth at bedtime. Facility MD to refill  10 capsule  0  . ferrous sulfate 325 (65 FE) MG EC tablet Take 325 mg by mouth daily with breakfast.      . nitroGLYCERIN (NITROSTAT) 0.4 MG SL tablet Place 0.4 mg under the tongue every 5 (five) minutes x 3 doses as needed. For chest pain       No current facility-administered medications for this visit.     Allergies  Allergen Reactions  . Codeine Phosphate Nausea And Vomiting  . Darvocet (Propoxyphene-Acetaminophen) Nausea And Vomiting  . Promethazine Hcl Nausea And Vomiting  . Sulfamethoxazole Other (See Comments)    Pt doesn't remember  reaction  . Vicodin (Hydrocodone-Acetaminophen) Other (See Comments)    Unknown reaction  . Amoxicillin Nausea And Vomiting and Rash  . Penicillins Nausea And Vomiting and Rash    BP 106/72  Pulse 64  Temp(Src) 98 F (36.7 C) (Temporal)  Resp 15  Ht 5\' 1"  (1.549 m)  Wt 148 lb (67.132 kg)  BMI 27.98 kg/m2  No results found.   Review of Systems  Constitutional: Negative for fever, chills and diaphoresis.  HENT: Positive for hearing loss. Negative for ear pain, sore throat and trouble swallowing.   Eyes: Negative for photophobia and visual disturbance.  Respiratory: Negative for cough and choking.   Cardiovascular: Negative for chest pain and palpitations.  Gastrointestinal: Negative for nausea, vomiting, abdominal pain, diarrhea, constipation, anal bleeding and rectal pain.  Genitourinary: Negative for dysuria, frequency and difficulty urinating.  Musculoskeletal: Negative for myalgias and gait problem.  Skin: Negative for color change, pallor and rash.  Neurological: Positive for weakness. Negative for dizziness, speech difficulty and numbness.  Hematological: Negative for adenopathy.  Psychiatric/Behavioral: Positive for confusion. Negative for agitation. The patient is not nervous/anxious and is not hyperactive.        Objective:   Physical Exam  Constitutional: She is oriented to person, place, and time. She appears well-developed and well-nourished. No distress.  HENT:  Head: Normocephalic.  Mouth/Throat: Oropharynx is clear and moist. No oropharyngeal exudate.  Eyes: Conjunctivae and EOM are normal. Pupils are equal, round, and reactive to light. No scleral icterus.  Neck: Normal range of motion. No tracheal deviation present.  Cardiovascular: Normal rate and intact distal pulses.   Pulmonary/Chest: Effort normal. No respiratory distress. She exhibits no tenderness.  Abdominal: Soft. She exhibits no distension. There is no tenderness. Hernia confirmed negative in the  right inguinal area and confirmed negative in the left inguinal area.  Incisions clean with normal healing ridges.  No hernias  Genitourinary:    No vaginal discharge found.  Musculoskeletal: Normal range of motion. She exhibits no tenderness.  Lymphadenopathy:       Right: No inguinal adenopathy present.       Left: No inguinal adenopathy present.  Neurological: She is alert and oriented to person, place, and time. No cranial nerve deficit. She exhibits normal muscle tone. Coordination normal.  Skin: Skin is warm and dry. No rash noted. She is not diaphoretic.  Psychiatric: She has a normal mood and affect. Her speech is normal and behavior is normal. She is not agitated and not slowed. Cognition and memory are impaired. She exhibits abnormal recent memory.       Assessment:     Left buttock abscess in a patient with recurrent infections and MRSA steroid dependent/immunosuppressed     Plan:     I&D:  The pathophysiology of subcutaneous abscess and differential diagnosis was discussed.  Natural history progression was discussed.  The patient's symptoms are not adequately controlled.  Non-operative treatment has not healed the abscess.  Therefore, I recommended incision & drainage of the abscess to allow the infection to resolve and heal.  Technique, risks, benefits, alternatives discussed.  The patient expressed understanding & wished to proceed.  I placed a field block with local anaesthetic around the left buttock.  .  I incised the skin over the abscess to release the infection.  I excised skin at the wound to have an adequate opening for drainage & prevent skin reclosure.  I packed the wound with ribbon NU-Gauze.  I held pressure for several minutes.  Hemostasis was good  The patient tolerated the procedure.  I explained wound care to the patient's caregiver.  We will have the patient return to clinic for close follow up to make sure the infection heals.  The caregiver is concerned  that she gets nauseated after these.  Has allergies to other medicines.  Prescribed Zofran given her intolerance to promethazine and Reglan., and advanced age  Discussed good pain control.  They have tramadol home.  He thinks that will be enough

## 2013-04-24 ENCOUNTER — Ambulatory Visit (INDEPENDENT_AMBULATORY_CARE_PROVIDER_SITE_OTHER): Payer: Medicare Other | Admitting: Surgery

## 2013-04-24 ENCOUNTER — Encounter (INDEPENDENT_AMBULATORY_CARE_PROVIDER_SITE_OTHER): Payer: Self-pay | Admitting: Surgery

## 2013-04-24 VITALS — BP 106/58 | HR 68 | Temp 97.4°F | Resp 15 | Ht 61.0 in | Wt 149.0 lb

## 2013-04-24 DIAGNOSIS — L0231 Cutaneous abscess of buttock: Secondary | ICD-10-CM

## 2013-04-24 NOTE — Patient Instructions (Addendum)

## 2013-04-24 NOTE — Progress Notes (Signed)
Subjective:     Patient ID: [name] [name], female   DOB: 10-May-1921, 79 y.o.   MRN: [id number]  HPI   [name] [name]  [birth year] [id number]  Patient Care Team: [name], MD as PCP - General  This patient is a 79 y.o.female who presents today for surgical evaluation at the request of Dr. Renne Crigler.   Reason for visit: f/u s/p I&D Left buttock abscess 04/12/2013  Elderly woman with a history of MRSA infections.  Fully anticoagulated on warfarin.  On steroids for PMR.   Has had multiple drainages done in the past.  January April of this year.  I drained a new abscess on left buttock.  Son comes in now.  May have been packing the wound every day.  Using dilute peroxide to help clean it out.  Patient Active Problem List   Diagnosis Date Noted  . Cellulitis and abscess of left buttock 04/12/2013  . Stricture and stenosis of esophagus 10/10/2012  . Dementia 10/06/2012  . Dysphagia 10/06/2012  . Axillary abscess - bilateral, multiple 09/27/2012  . Hypotension 08/24/2012  . Near syncope 08/24/2012  . Abscess of axilla, left 06/27/2012  . Bradycardia 06/13/2012  . Abscess 06/11/2012  . Chest pain 06/09/2012  . Cellulitis 06/09/2012  . Weakness of both legs 06/09/2012  . PMR (polymyalgia rheumatica)   . Restless leg syndrome   . Angina pectoris, unstable 03/02/2012  . Anemia 03/02/2012  . Dyspnea 02/03/2012  . UTI (urinary tract infection) 02/03/2012  . CAD (coronary artery disease)   . HTN (hypertension)   . VTE (venous thromboembolism) 10/01/2010  . HYPERCHOLESTEROLEMIA  IIA 08/27/2009  . HYPERLIPIDEMIA-MIXED 12/11/2008  . HYPERTENSION, BENIGN 12/11/2008    Past Medical History  Diagnosis Date  . CAD (coronary artery disease)     S/p PTCA / stenting (last cath 2004, multiple LAD stents, 2 stents in the right coronary artery all patent)  . 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 Started coumadin  . HTN (hypertension)   . Restless leg syndrome   . PMR  (polymyalgia rheumatica)   . Axillary abscess   . Anxiety   . Dementia   . Cancer   . Complication of anesthesia   . PONV (postoperative nausea and vomiting)   . Shortness of breath   . GERD (gastroesophageal reflux disease)     Past Surgical History  Procedure Laterality Date  . Colectomy    . Breast lumpectomy    . Stent      cardiac x 8 stents.  . Incision and drainage      bilateral axillary, non specific staff  . Incision and drainage abscess  09/28/2012    Procedure: INCISION AND DRAINAGE ABSCESS;  Surgeon: Liz Malady, MD;  Location: [facility] OR;  Service: General;  Laterality: Bilateral;  . Esophagogastroduodenoscopy (egd) with esophageal dilation  10/10/2012    Procedure: ESOPHAGOGASTRODUODENOSCOPY (EGD) WITH ESOPHAGEAL DILATION;  Surgeon: Louis Meckel, MD;  Location: [facility] ENDOSCOPY;  Service: Endoscopy;  Laterality: N/A;    History   Social History  . Marital Status: Widowed    Spouse Name: N/A    Number of Children: N/A  . Years of Education: N/A   Occupational History  . retired    Social History Main Topics  . Smoking status: Never Smoker   . Smokeless tobacco: Never Used  . Alcohol Use: No  . Drug Use: No  . Sexually Active:    Other Topics Concern  . Not  on file   Social History Narrative   She lives at home with her son and is still ambulatory daily with a cane or walker. She denies any history of smoking. Marland Kitchen.Mother died at 61 from a myocardial infarction.  Father    died at 81 from a stroke, possible history of PE. She has nine brothers and    sisters, one sister with bypass and a brother with bypass, also has a    brother who died abruptly of a pulmonary embolism.          History reviewed. No pertinent family history.  Current Outpatient Prescriptions  Medication Sig Dispense Refill  . acetaminophen (TYLENOL) 500 MG tablet Take 1,000 mg by mouth 3 (three) times daily as needed for pain. For pain      . amLODipine (NORVASC) 10 MG tablet  Take 10 mg by mouth every morning.       Marland Kitchen atorvastatin (LIPITOR) 20 MG tablet Take 10 mg by mouth every evening.       . cholecalciferol (VITAMIN D) 1000 UNITS tablet Take 2,000 Units by mouth daily.      . cholestyramine light (PREVALITE) 4 G packet Take 4 g by mouth daily as needed (for loose bowels).      . donepezil (ARICEPT) 10 MG tablet Take 10 mg by mouth every morning.      Marland Kitchen esomeprazole (NEXIUM) 40 MG capsule Take 40 mg by mouth 2 (two) times daily as needed (for acid reflux).      . LORazepam (ATIVAN) 0.5 MG tablet Take 0.25 mg by mouth 3 (three) times daily as needed. for anxiety      . losartan (COZAAR) 50 MG tablet Take 50 mg by mouth at bedtime.       . mirabegron ER (MYRBETRIQ) 25 MG TB24 Take 25 mg by mouth every evening.       . mupirocin ointment (BACTROBAN) 2 % Apply topically 3 (three) times daily.      . ondansetron (ZOFRAN) 4 MG tablet Take 1 tablet (4 mg total) by mouth every 8 (eight) hours as needed for nausea.  15 tablet  2  . Pediatric Multiple Vit-C-FA (CHILDRENS CHEWABLE MULTI VITS PO) Take 1 tablet by mouth daily.      . potassium chloride SA (K-DUR,KLOR-CON) 20 MEQ tablet Take 20 mEq by mouth daily.      . predniSONE (DELTASONE) 1 MG tablet Take 2 mg by mouth every morning.       Marland Kitchen rOPINIRole (REQUIP) 1 MG tablet Take 0.5-1 mg by mouth 3 (three) times daily. 1 tab at noon, 1/2 tab late afternoon, 1/2 tab one-three hours before bed      . traMADol (ULTRAM) 50 MG tablet Take 50 mg by mouth every 6 (six) hours as needed for pain.      Marland Kitchen warfarin (COUMADIN) 2.5 MG tablet Take 2.5 mg by mouth every evening.       . zaleplon (SONATA) 10 MG capsule Take 1 capsule (10 mg total) by mouth at bedtime. Facility MD to refill  10 capsule  0  . ferrous sulfate 325 (65 FE) MG EC tablet Take 325 mg by mouth daily with breakfast.      . nitroGLYCERIN (NITROSTAT) 0.4 MG SL tablet Place 0.4 mg under the tongue every 5 (five) minutes x 3 doses as needed. For chest pain       No  current facility-administered medications for this visit.     Allergies  Allergen Reactions  .  Codeine Phosphate Nausea And Vomiting  . Darvocet (Propoxyphene-Acetaminophen) Nausea And Vomiting  . Promethazine Hcl Nausea And Vomiting  . Sulfamethoxazole Other (See Comments)    Pt doesn't remember reaction  . Vicodin (Hydrocodone-Acetaminophen) Other (See Comments)    Unknown reaction  . Amoxicillin Nausea And Vomiting and Rash  . Penicillins Nausea And Vomiting and Rash    BP 106/58  Pulse 68  Temp(Src) 97.4 F (36.3 C) (Temporal)  Resp 15  Ht 5\' 1"  (1.549 m)  Wt 149 lb (67.586 kg)  BMI 28.17 kg/m2  No results found.   Review of Systems  Constitutional: Negative for fever, chills and diaphoresis.  HENT: Positive for hearing loss. Negative for ear pain, sore throat and trouble swallowing.   Eyes: Negative for photophobia and visual disturbance.  Respiratory: Negative for cough and choking.   Cardiovascular: Negative for chest pain and palpitations.  Gastrointestinal: Negative for nausea, vomiting, abdominal pain, diarrhea, constipation, anal bleeding and rectal pain.  Genitourinary: Negative for dysuria, frequency and difficulty urinating.  Musculoskeletal: Negative for myalgias and gait problem.  Skin: Positive for wound. Negative for color change, pallor and rash.  Neurological: Positive for weakness. Negative for dizziness, speech difficulty and numbness.  Hematological: Negative for adenopathy.  Psychiatric/Behavioral: Positive for confusion. Negative for agitation. The patient is not nervous/anxious and is not hyperactive.        Objective:   Physical Exam  Constitutional: She is oriented to person, place, and time. She appears well-developed and well-nourished. No distress.  HENT:  Head: Normocephalic.  Mouth/Throat: Oropharynx is clear and moist. No oropharyngeal exudate.  Eyes: Conjunctivae and EOM are normal. Pupils are equal, round, and reactive to light. No  scleral icterus.  Neck: Normal range of motion. No tracheal deviation present.  Cardiovascular: Normal rate and intact distal pulses.   Pulmonary/Chest: Effort normal. No respiratory distress. She exhibits no tenderness.  Abdominal: Soft. She exhibits no distension. There is no tenderness. Hernia confirmed negative in the right inguinal area and confirmed negative in the left inguinal area.  Genitourinary:    No vaginal discharge found.  Musculoskeletal: Normal range of motion. She exhibits no tenderness.  Lymphadenopathy:       Right: No inguinal adenopathy present.       Left: No inguinal adenopathy present.  Neurological: She is alert and oriented to person, place, and time. No cranial nerve deficit. She exhibits normal muscle tone. Coordination normal.  Skin: Skin is warm and dry. No rash noted. She is not diaphoretic.  Psychiatric: She has a normal mood and affect. Her speech is normal and behavior is normal. She is not agitated and not slowed. Cognition and memory are impaired. She exhibits abnormal recent memory.       Assessment:     Left buttock abscess in a patient with recurrent infections and MRSA steroid dependent/immunosuppressed s/p I&D, improving slowly    Plan:     Continue dressing care.  Consider switching to quarter inch iodoform Nu Gauze.  Soap and water fine.  Try not to use peroxide to mention although 50% concentration safer  Return to clinic q2-3 weeks until closed.   Instructions discussed.  Followup with primary care physician for other health issues as would normally be done.  Questions answered.  The patient expressed understanding and appreciation

## 2013-04-27 ENCOUNTER — Encounter (HOSPITAL_COMMUNITY): Payer: Self-pay | Admitting: Emergency Medicine

## 2013-04-27 ENCOUNTER — Emergency Department (HOSPITAL_COMMUNITY): Payer: Medicare Other

## 2013-04-27 ENCOUNTER — Emergency Department (HOSPITAL_COMMUNITY)
Admission: EM | Admit: 2013-04-27 | Discharge: 2013-04-27 | Disposition: A | Discharge status: Home / Self Care | Payer: Medicare Other | Attending: Emergency Medicine | Admitting: Emergency Medicine

## 2013-04-27 DIAGNOSIS — R0602 Shortness of breath: Secondary | ICD-10-CM | POA: Insufficient documentation

## 2013-04-27 DIAGNOSIS — N390 Urinary tract infection, site not specified: Secondary | ICD-10-CM

## 2013-04-27 DIAGNOSIS — Z7901 Long term (current) use of anticoagulants: Secondary | ICD-10-CM | POA: Insufficient documentation

## 2013-04-27 DIAGNOSIS — Z86718 Personal history of other venous thrombosis and embolism: Secondary | ICD-10-CM | POA: Insufficient documentation

## 2013-04-27 DIAGNOSIS — Z79899 Other long term (current) drug therapy: Secondary | ICD-10-CM | POA: Insufficient documentation

## 2013-04-27 DIAGNOSIS — K219 Gastro-esophageal reflux disease without esophagitis: Secondary | ICD-10-CM | POA: Insufficient documentation

## 2013-04-27 DIAGNOSIS — R110 Nausea: Secondary | ICD-10-CM | POA: Insufficient documentation

## 2013-04-27 DIAGNOSIS — R61 Generalized hyperhidrosis: Secondary | ICD-10-CM | POA: Insufficient documentation

## 2013-04-27 DIAGNOSIS — R350 Frequency of micturition: Secondary | ICD-10-CM | POA: Insufficient documentation

## 2013-04-27 DIAGNOSIS — R079 Chest pain, unspecified: Secondary | ICD-10-CM | POA: Insufficient documentation

## 2013-04-27 DIAGNOSIS — F0390 Unspecified dementia without behavioral disturbance: Secondary | ICD-10-CM | POA: Insufficient documentation

## 2013-04-27 DIAGNOSIS — R300 Dysuria: Secondary | ICD-10-CM | POA: Insufficient documentation

## 2013-04-27 DIAGNOSIS — I10 Essential (primary) hypertension: Secondary | ICD-10-CM | POA: Insufficient documentation

## 2013-04-27 DIAGNOSIS — Z880 Allergy status to penicillin: Secondary | ICD-10-CM | POA: Insufficient documentation

## 2013-04-27 DIAGNOSIS — Z859 Personal history of malignant neoplasm, unspecified: Secondary | ICD-10-CM | POA: Insufficient documentation

## 2013-04-27 DIAGNOSIS — Z872 Personal history of diseases of the skin and subcutaneous tissue: Secondary | ICD-10-CM | POA: Insufficient documentation

## 2013-04-27 DIAGNOSIS — I2510 Atherosclerotic heart disease of native coronary artery without angina pectoris: Secondary | ICD-10-CM | POA: Insufficient documentation

## 2013-04-27 DIAGNOSIS — Z8739 Personal history of other diseases of the musculoskeletal system and connective tissue: Secondary | ICD-10-CM | POA: Insufficient documentation

## 2013-04-27 DIAGNOSIS — Z8669 Personal history of other diseases of the nervous system and sense organs: Secondary | ICD-10-CM | POA: Insufficient documentation

## 2013-04-27 DIAGNOSIS — F411 Generalized anxiety disorder: Secondary | ICD-10-CM | POA: Insufficient documentation

## 2013-04-27 LAB — CBC WITH DIFFERENTIAL/PLATELET
Basophils Absolute: 0 10*3/uL (ref 0.0–0.1)
Basophils Relative: 0 % (ref 0–1)
Eosinophils Absolute: 0.1 10*3/uL (ref 0.0–0.7)
Eosinophils Relative: 1 % (ref 0–5)
HCT: 38.8 % (ref 36.0–46.0)
MCHC: 32 g/dL (ref 30.0–36.0)
MCV: 89 fL (ref 78.0–100.0)
Monocytes Absolute: 0.5 10*3/uL (ref 0.1–1.0)
RDW: 14 % (ref 11.5–15.5)

## 2013-04-27 LAB — BASIC METABOLIC PANEL
Calcium: 8.9 mg/dL (ref 8.4–10.5)
Creatinine, Ser: 0.83 mg/dL (ref 0.50–1.10)
GFR calc Af Amer: 70 mL/min — ABNORMAL LOW (ref >90)

## 2013-04-27 LAB — URINALYSIS, ROUTINE W REFLEX MICROSCOPIC
Glucose, UA: NEGATIVE mg/dL
Ketones, ur: NEGATIVE mg/dL
Protein, ur: NEGATIVE mg/dL

## 2013-04-27 LAB — TROPONIN I: Troponin I: <0.3 ng/mL (ref <0.30)

## 2013-04-27 MED ORDER — DOXYCYCLINE HYCLATE 100 MG PO TABS
100.0000 mg | ORAL_TABLET | Freq: Once | ORAL | Status: AC
  Administered 2013-04-27: 100 mg via ORAL
  Filled 2013-04-27: qty 1

## 2013-04-27 MED ORDER — CEPHALEXIN 250 MG PO CAPS: 250.0000 mg | ORAL_CAPSULE | Freq: Four times a day (QID) | ORAL | Status: DC

## 2013-04-27 MED ORDER — ONDANSETRON HCL 4 MG/2ML IJ SOLN
4.0000 mg | Freq: Once | INTRAMUSCULAR | Status: DC
  Filled 2013-04-27: qty 2

## 2013-04-27 MED ORDER — CEPHALEXIN 250 MG PO CAPS: 250.0000 mg | ORAL_CAPSULE | Freq: Once | ORAL | Status: DC

## 2013-04-27 MED ORDER — SODIUM CHLORIDE 0.9 % IV BOLUS (SEPSIS): 1000.0000 mL | Freq: Once | INTRAVENOUS | Status: DC

## 2013-04-27 MED ORDER — DOXYCYCLINE HYCLATE 100 MG PO CAPS: 100.0000 mg | ORAL_CAPSULE | Freq: Two times a day (BID) | ORAL | Status: DC

## 2013-04-27 NOTE — ED Notes (Signed)
Pt to ED from home with c/o pain when urinating, loose stool, generalize weakness and back pain onset yesterday. Pt alert and oriented x4. Per EMS BP-149/69, HR 57 regular, RR-18, O2 97% room air.

## 2013-04-27 NOTE — ED Provider Notes (Signed)
History    CSN: [id number] Arrival date & time 04/27/13  1315  None    Chief Complaint  Patient presents with  . Urinary Tract Infection   (Consider location/radiation/quality/duration/timing/severity/associated sxs/prior Treatment) HPI Comments: Patient is an 79 year old female who presents today after 3 weeks of not feeling well. In the past 24 hours she has become diaphoretic with associated shortness of breath and chest heaviness. She states she was nauseous this morning, but did not feel it was severe enough to throw up. She is oriented to person, place, time. She has a history of urinary tract infections and her son feels as though this is a similar presentation. Her son states that yesterday she was a little bit more confused than normal, and she is better today. She states this morning she began to have burning with urination. She has chronic urinary frequency and for which she is medicated. She also has chronic loose stools. She lives with her son and he states that she has been compliant with her medications. She has been eating and drinking well.  The history is provided by the patient and a caregiver. No language interpreter was used.   Past Medical History  Diagnosis Date  . CAD (coronary artery disease)     S/p PTCA / stenting (last cath 2004, multiple LAD stents, 2 stents in the right coronary artery all patent)  . 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 Started coumadin  . HTN (hypertension)   . Restless leg syndrome   . PMR (polymyalgia rheumatica)   . Axillary abscess   . Anxiety   . Dementia   . Cancer   . Complication of anesthesia   . PONV (postoperative nausea and vomiting)   . Shortness of breath   . GERD (gastroesophageal reflux disease)    Past Surgical History  Procedure Laterality Date  . Colectomy    . Breast lumpectomy    . Stent      cardiac x 8 stents.  . Incision and drainage      bilateral axillary, non specific staff  .  Incision and drainage abscess  09/28/2012    Procedure: INCISION AND DRAINAGE ABSCESS;  Surgeon: Liz Malady, MD;  Location: [facility] OR;  Service: General;  Laterality: Bilateral;  . Esophagogastroduodenoscopy (egd) with esophageal dilation  10/10/2012    Procedure: ESOPHAGOGASTRODUODENOSCOPY (EGD) WITH ESOPHAGEAL DILATION;  Surgeon: Louis Meckel, MD;  Location: [facility];  Service: Endoscopy;  Laterality: N/A;   History reviewed. No pertinent family history. History  Substance Use Topics  . Smoking status: Never Smoker   . Smokeless tobacco: Never Used  . Alcohol Use: No   OB History   Grav Para Term Preterm Abortions TAB SAB Ect Mult Living                 Review of Systems  Constitutional: Positive for diaphoresis. Negative for fever, chills and appetite change.  Respiratory: Positive for shortness of breath. Negative for chest tightness.   Cardiovascular: Positive for chest pain (heavy).  Gastrointestinal: Positive for nausea. Negative for vomiting and abdominal pain.  Genitourinary: Positive for dysuria and frequency (chronic).  All other systems reviewed and are negative.    Allergies  Codeine phosphate; Darvocet; Promethazine hcl; Sulfamethoxazole; Vicodin; Amoxicillin; and Penicillins  Home Medications   Current Outpatient Rx  Name  Route  Sig  Dispense  Refill  . acetaminophen (TYLENOL) 500 MG tablet   Oral   Take 1,000 mg by mouth  3 (three) times daily as needed for pain. For pain         . amLODipine (NORVASC) 10 MG tablet   Oral   Take 10 mg by mouth every morning.          Marland Kitchen atorvastatin (LIPITOR) 20 MG tablet   Oral   Take 10 mg by mouth every evening.          . benzonatate (TESSALON) 100 MG capsule   Oral   Take 100 mg by mouth 3 (three) times daily as needed for cough.         . cholecalciferol (VITAMIN D) 1000 UNITS tablet   Oral   Take 2,000 Units by mouth daily.         . cholestyramine light (PREVALITE) 4 G packet   Oral    Take 4 g by mouth daily as needed (for loose bowels).         . donepezil (ARICEPT) 10 MG tablet   Oral   Take 10 mg by mouth every morning.         Marland Kitchen esomeprazole (NEXIUM) 40 MG capsule   Oral   Take 40 mg by mouth 2 (two) times daily as needed (for acid reflux).         . ferrous sulfate 325 (65 FE) MG EC tablet   Oral   Take 325 mg by mouth daily with breakfast.         . LORazepam (ATIVAN) 0.5 MG tablet   Oral   Take 0.25 mg by mouth 3 (three) times daily as needed. for anxiety         . losartan (COZAAR) 50 MG tablet   Oral   Take 50 mg by mouth at bedtime.          . mirabegron ER (MYRBETRIQ) 25 MG TB24   Oral   Take 25 mg by mouth every evening.          . mupirocin ointment (BACTROBAN) 2 %   Topical   Apply topically 3 (three) times daily.         . nitroGLYCERIN (NITROSTAT) 0.4 MG SL tablet   Sublingual   Place 0.4 mg under the tongue every 5 (five) minutes as needed for chest pain.         Marland Kitchen ondansetron (ZOFRAN) 4 MG tablet   Oral   Take 1 tablet (4 mg total) by mouth every 8 (eight) hours as needed for nausea.   15 tablet   2   . Pediatric Multiple Vit-C-FA (CHILDRENS CHEWABLE MULTI VITS PO)   Oral   Take 1 tablet by mouth daily.         . potassium chloride SA (K-DUR,KLOR-CON) 20 MEQ tablet   Oral   Take 20 mEq by mouth daily.         . predniSONE (DELTASONE) 1 MG tablet   Oral   Take 2 mg by mouth every morning.          Marland Kitchen rOPINIRole (REQUIP) 1 MG tablet   Oral   Take 0.5-1 mg by mouth 3 (three) times daily. 1 tab at noon, 1/2 tab late afternoon, 1/2 tab one-three hours before bed         . traMADol (ULTRAM) 50 MG tablet   Oral   Take 25 mg by mouth 3 (three) times daily as needed for pain.          Marland Kitchen warfarin (COUMADIN) 2.5 MG tablet   Oral  Take 2.5 mg by mouth every evening.          . zaleplon (SONATA) 10 MG capsule   Oral   Take 1 capsule (10 mg total) by mouth at bedtime. Facility MD to refill   10  capsule   0    BP 152/58  Pulse 59  Temp(Src) 97.6 F (36.4 C) (Oral)  Resp 18  SpO2 100% Physical Exam  Nursing note and vitals reviewed. Constitutional: She is oriented to person, place, and time. She appears well-developed and well-nourished. No distress.  HENT:  Head: Normocephalic and atraumatic.  Right Ear: External ear normal.  Left Ear: External ear normal.  Nose: Nose normal.  Mouth/Throat: Oropharynx is clear and moist.  Eyes: Conjunctivae are normal.  Neck: Normal range of motion.  Cardiovascular: Normal rate, regular rhythm and normal heart sounds.   Pulmonary/Chest: Effort normal and breath sounds normal. No stridor. No respiratory distress. She has no wheezes. She has no rales.  Abdominal: Soft. She exhibits no distension. There is no tenderness.  Musculoskeletal: Normal range of motion.  Neurological: She is alert and oriented to person, place, and time. She has normal strength. No sensory deficit. Coordination normal.  Skin: Skin is warm and dry. She is not diaphoretic. No erythema.  Psychiatric: She has a normal mood and affect. Her behavior is normal.    ED Course  Procedures (including critical care time)  Labs Reviewed  URINALYSIS, ROUTINE W REFLEX MICROSCOPIC - Abnormal; Notable for the following:    Hgb urine dipstick SMALL (*)    Nitrite POSITIVE (*)    Leukocytes, UA MODERATE (*)    All other components within normal limits  CBC WITH DIFFERENTIAL - Abnormal; Notable for the following:    Neutrophils Relative % 79 (*)    Neutro Abs 7.9 (*)    All other components within normal limits  BASIC METABOLIC PANEL - Abnormal; Notable for the following:    Sodium 132 (*)    Potassium 5.3 (*)    CO2 18 (*)    Glucose, Bld 119 (*)    GFR calc non Af Amer 61 (*)    GFR calc Af Amer 70 (*)    All other components within normal limits  URINE MICROSCOPIC-ADD ON - Abnormal; Notable for the following:    Bacteria, UA MANY (*)    All other components within  normal limits  TROPONIN I    Dg Chest 2 View  04/27/2013   *RADIOLOGY REPORT*  Clinical Data: And, history hypertension, coronary artery disease, hyperlipidemia  CHEST - 2 VIEW  Comparison: 08/24/2012  Findings: Upper normal heart size. Tortuous aorta with atherosclerotic calcifications. Mediastinal contours pulmonary vascularity normal. Eventration right diaphragm unchanged. Minimal right basilar atelectasis. Underlying emphysematous changes. No acute infiltrate, pleural effusion or pneumothorax. Chronic compression deformities of two lower thoracic vertebrae appear unchanged.  IMPRESSION: Emphysematous changes with minimal right basilar atelectasis.   Original Report Authenticated By: Ulyses Southward, M.D.     Date: 04/27/2013  Rate: 59  Rhythm: normal sinus rhythm  QRS Axis: normal  Intervals: normal  ST/T Wave abnormalities: normal  Conduction Disutrbances:none  Narrative Interpretation:   Old EKG Reviewed: unchanged    1. Urinary tract infection     MDM  Pt has been diagnosed with a UTI. Pt is afebrile, no CVA tenderness, normotensive, and denies N/V. Pt to be dc home with antibiotics and instructions to follow up with PCP if symptoms persist. First dose of abx given in department. Attempted  to give fluids, but IV team could not start IV. Dr. Anitra Lauth evaluated patient and agrees with plan.    Medications  doxycycline (VIBRA-TABS) tablet 100 mg (100 mg Oral Given 04/27/13 1722)     Mora Bellman, PA-C 04/28/13 0130

## 2013-04-27 NOTE — ED Notes (Addendum)
IV start attempt x2 with no success. IV team called.

## 2013-04-28 NOTE — ED Provider Notes (Signed)
Medical screening examination/treatment/procedure(s) were conducted as a shared visit with non-physician practitioner(s) and myself.  I personally evaluated the patient during the encounter Pt with multiple vague sx including dysuria and decreased appetite and weakness.  Labs show UTI.  Feel pt can f/u with PCP and do outpt treatment.  Gwyneth Sprout, MD 04/28/13 2306

## 2013-05-01 LAB — URINE CULTURE: Colony Count: >=100000

## 2013-05-08 ENCOUNTER — Ambulatory Visit (INDEPENDENT_AMBULATORY_CARE_PROVIDER_SITE_OTHER): Payer: Medicare Other | Admitting: Surgery

## 2013-05-08 ENCOUNTER — Encounter (INDEPENDENT_AMBULATORY_CARE_PROVIDER_SITE_OTHER): Payer: Self-pay | Admitting: Surgery

## 2013-05-08 VITALS — BP 120/68 | HR 88 | Resp 20 | Ht 62.0 in | Wt 148.2 lb

## 2013-05-08 DIAGNOSIS — L0291 Cutaneous abscess, unspecified: Secondary | ICD-10-CM

## 2013-05-08 DIAGNOSIS — L0390 Cellulitis, unspecified: Secondary | ICD-10-CM

## 2013-05-08 MED ORDER — FLUCONAZOLE 50 MG PO TABS: 100.0000 mg | ORAL_TABLET | Freq: Every day | ORAL | Status: DC

## 2013-05-08 MED ORDER — NYSTATIN 100000 UNIT/GM EX POWD: CUTANEOUS | Status: DC

## 2013-05-08 NOTE — Progress Notes (Signed)
NAME: [name] [name]       DOB: 09/18/1921           DATE: 05/08/2013       WUJ:[id number]  CC:  Chief Complaint  Patient presents with  . 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 She's had them lanced bilaterally in the axillas in the inframammary fold area as well as recently in the left buttock area. These areas tended to have healed up. She has some redness and draining of both groin creases as well as both inframammary folds. She came in to be evaluated to see if there is anything needs to be drained.  EXAM: Vital signs: BP 120/68  Pulse 88  Resp 20  Ht 5\' 2"  (1.575 m)  Wt 148 lb 3.2 oz (67.223 kg)  BMI 27.1 kg/m2  General: Patient alert, oriented, NAD  Breasts: In both inframammary folds. Red and they have a whitish ointment on. She quite moist and this appears to have a fungal-type appearance to me. In addition there multiple superficial what appear to be warts on both sides.  Axillas: no evidence recurrent infection Groin: Bilaterally in the groin creases are areas that are oriented of the skin but no evidence of abscess. Again there are warts this area looks very similar to the inframammary fold area.  Buttock: There is a prior ID is completely healed. IMP: bilateral superficial fungal infections without evidence of active bursa or abscess with associated warts    PLAN: put on some nystatin as well as a few doses of Diflucan. I recommended that they follow up with a dermatologist regarding these warts as I think this is more of a superficial skin problem and not a surgical issue.  Klever Twyford J 05/08/2013

## 2013-05-08 NOTE — Patient Instructions (Signed)
Start using antifungal creme for the rash in the groin and under the breast. Take the antifungal pills. I would recommend seeing a dermatologist about the warts in those areas

## 2013-05-21 ENCOUNTER — Encounter (INDEPENDENT_AMBULATORY_CARE_PROVIDER_SITE_OTHER): Payer: Medicare Other | Admitting: Surgery

## 2013-05-23 ENCOUNTER — Other Ambulatory Visit (HOSPITAL_COMMUNITY): Payer: Self-pay | Admitting: Nurse Practitioner

## 2013-06-01 ENCOUNTER — Other Ambulatory Visit (HOSPITAL_COMMUNITY): Payer: Self-pay | Admitting: Nurse Practitioner

## 2013-12-15 IMAGING — CR DG CHEST 2V
2 series · 2 of 2 positions shown · non-contrast
Comparison: 03/12/2012.

CLINICAL DATA: Weakness.  Hypotension.

CHEST - 2 VIEW

[w chest pa]
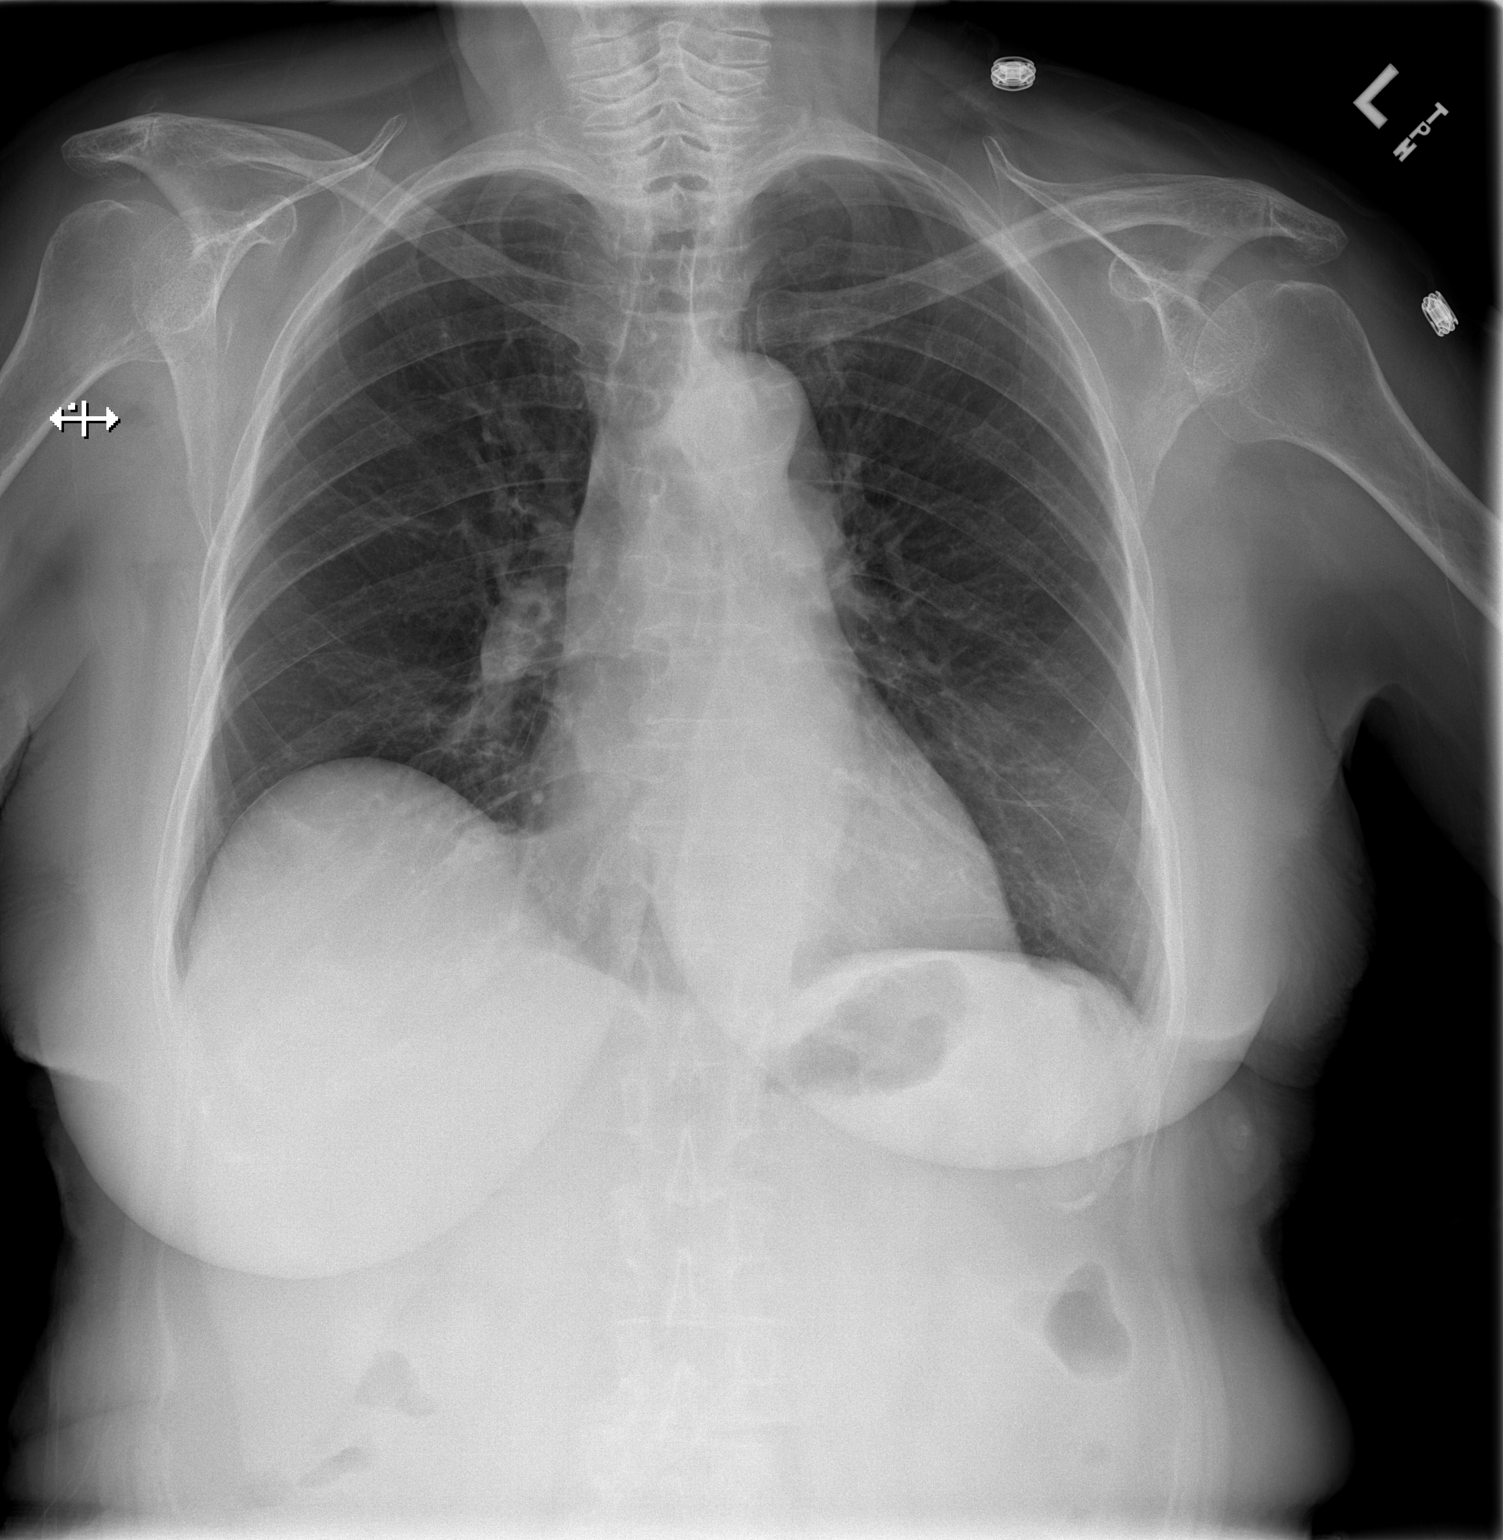

[w chest lat]
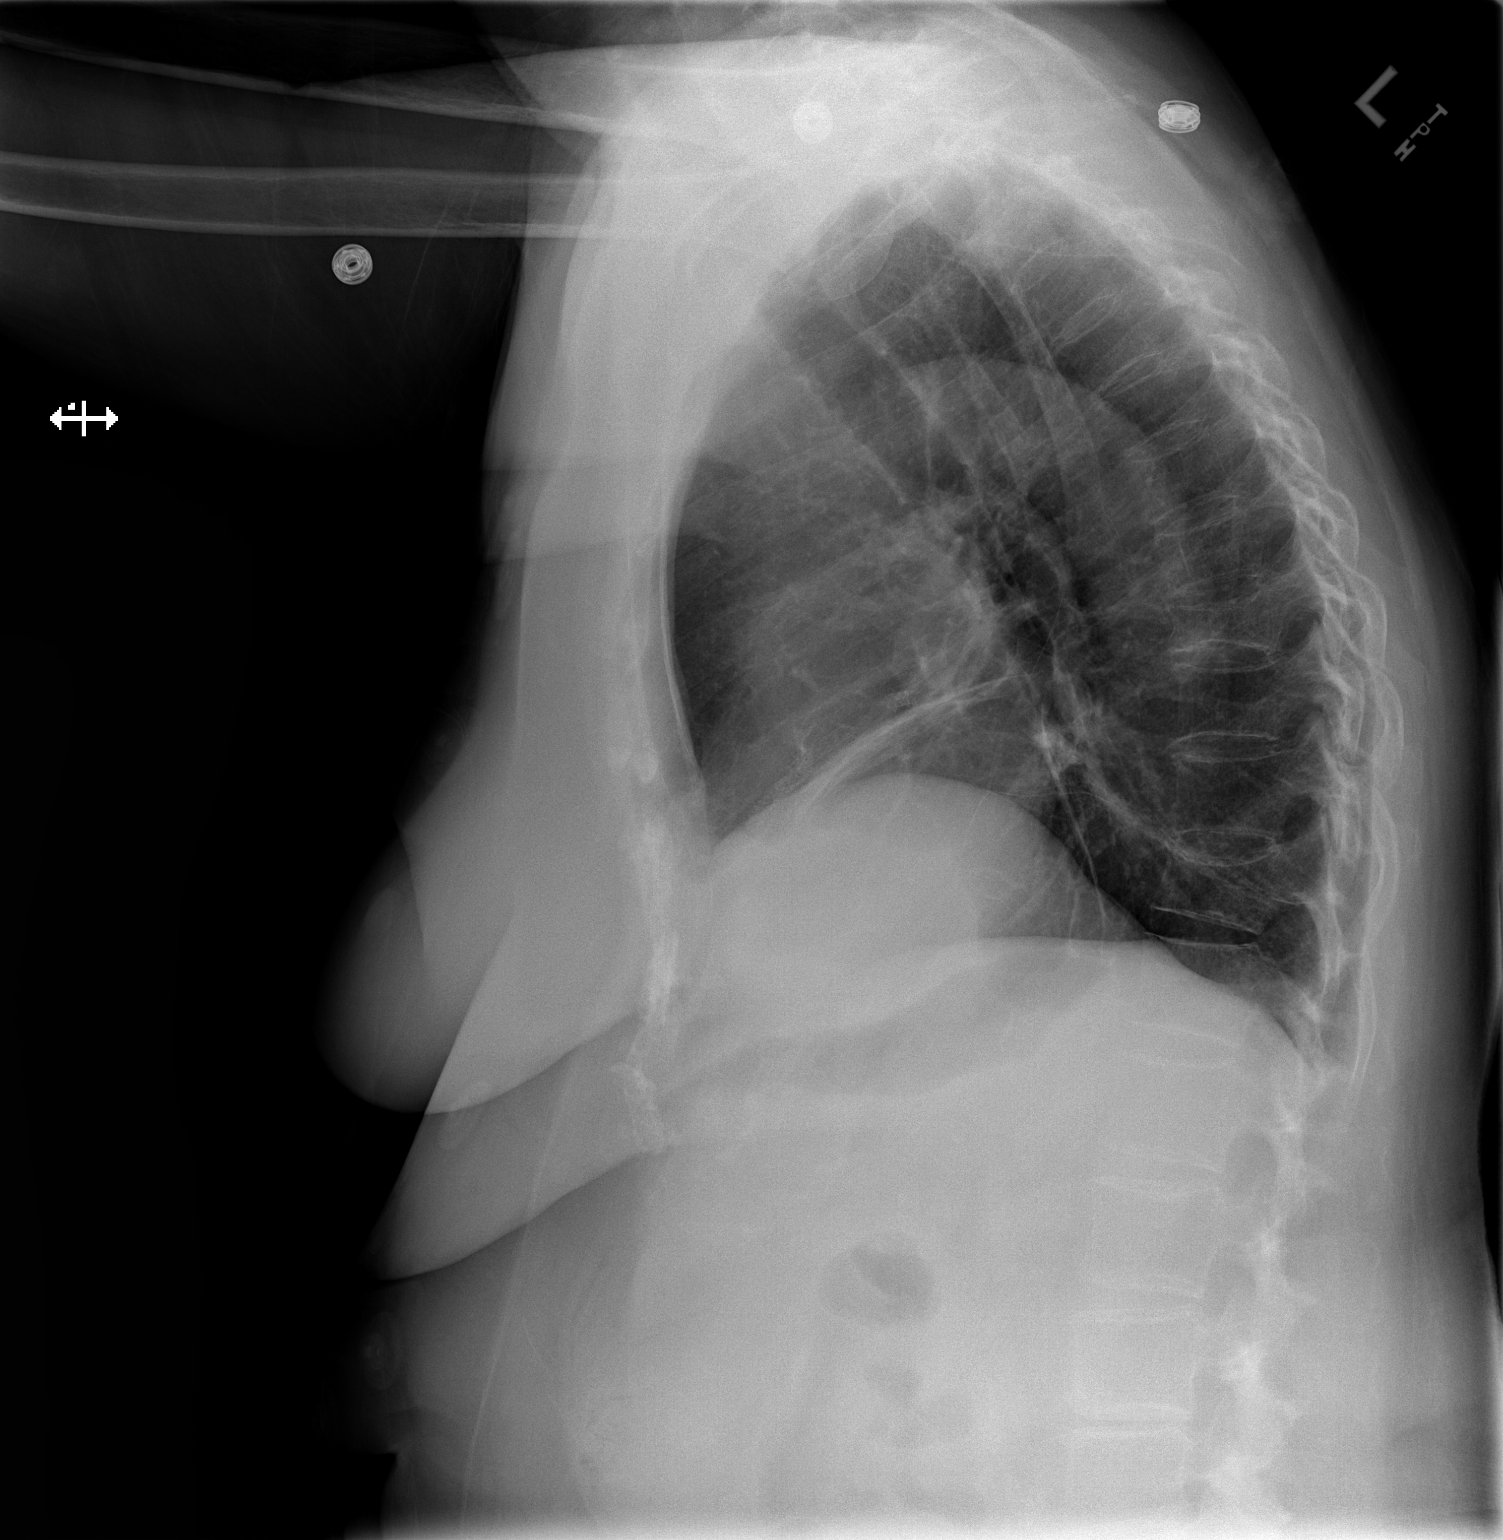

[2 of 2 positions shown; findings below may reference images not displayed]

FINDINGS: Coronary artery stent is noted on the lateral view.
Linear scarring or atelectasis is present at the left lung base
which appears similar to prior.  Elevation of the right
hemidiaphragm appears chronic.  There is no airspace disease.  No
effusion.  Cardiopericardial silhouette is within normal limits.
Chronic T9 and T12 compression fractures evident on the lateral
view.
IMPRESSION: No active cardiopulmonary disease.  No interval change from prior.

## 2014-01-04 ENCOUNTER — Ambulatory Visit: Payer: Medicare Other | Admitting: Cardiology

## 2014-01-20 ENCOUNTER — Emergency Department (HOSPITAL_COMMUNITY): Payer: Medicare Other

## 2014-01-20 ENCOUNTER — Encounter (HOSPITAL_COMMUNITY): Payer: Self-pay | Admitting: Emergency Medicine

## 2014-01-20 ENCOUNTER — Emergency Department (HOSPITAL_COMMUNITY)
Admission: EM | Admit: 2014-01-20 | Discharge: 2014-01-20 | Disposition: A | Discharge status: Home / Self Care | Payer: Medicare Other | Attending: Emergency Medicine | Admitting: Emergency Medicine

## 2014-01-20 DIAGNOSIS — Z79899 Other long term (current) drug therapy: Secondary | ICD-10-CM | POA: Insufficient documentation

## 2014-01-20 DIAGNOSIS — Z8739 Personal history of other diseases of the musculoskeletal system and connective tissue: Secondary | ICD-10-CM | POA: Insufficient documentation

## 2014-01-20 DIAGNOSIS — R0600 Dyspnea, unspecified: Secondary | ICD-10-CM

## 2014-01-20 DIAGNOSIS — R531 Weakness: Secondary | ICD-10-CM

## 2014-01-20 DIAGNOSIS — Z859 Personal history of malignant neoplasm, unspecified: Secondary | ICD-10-CM | POA: Insufficient documentation

## 2014-01-20 DIAGNOSIS — I10 Essential (primary) hypertension: Secondary | ICD-10-CM | POA: Insufficient documentation

## 2014-01-20 DIAGNOSIS — Z792 Long term (current) use of antibiotics: Secondary | ICD-10-CM | POA: Insufficient documentation

## 2014-01-20 DIAGNOSIS — R0609 Other forms of dyspnea: Secondary | ICD-10-CM | POA: Insufficient documentation

## 2014-01-20 DIAGNOSIS — R0989 Other specified symptoms and signs involving the circulatory and respiratory systems: Secondary | ICD-10-CM | POA: Insufficient documentation

## 2014-01-20 DIAGNOSIS — Z7901 Long term (current) use of anticoagulants: Secondary | ICD-10-CM | POA: Insufficient documentation

## 2014-01-20 DIAGNOSIS — R5383 Other fatigue: Secondary | ICD-10-CM

## 2014-01-20 DIAGNOSIS — K219 Gastro-esophageal reflux disease without esophagitis: Secondary | ICD-10-CM | POA: Insufficient documentation

## 2014-01-20 DIAGNOSIS — I2510 Atherosclerotic heart disease of native coronary artery without angina pectoris: Secondary | ICD-10-CM | POA: Insufficient documentation

## 2014-01-20 DIAGNOSIS — Z880 Allergy status to penicillin: Secondary | ICD-10-CM | POA: Insufficient documentation

## 2014-01-20 DIAGNOSIS — N390 Urinary tract infection, site not specified: Secondary | ICD-10-CM | POA: Insufficient documentation

## 2014-01-20 DIAGNOSIS — Z872 Personal history of diseases of the skin and subcutaneous tissue: Secondary | ICD-10-CM | POA: Insufficient documentation

## 2014-01-20 DIAGNOSIS — F411 Generalized anxiety disorder: Secondary | ICD-10-CM | POA: Insufficient documentation

## 2014-01-20 DIAGNOSIS — K5900 Constipation, unspecified: Secondary | ICD-10-CM | POA: Insufficient documentation

## 2014-01-20 DIAGNOSIS — R5381 Other malaise: Secondary | ICD-10-CM | POA: Insufficient documentation

## 2014-01-20 DIAGNOSIS — G2581 Restless legs syndrome: Secondary | ICD-10-CM | POA: Insufficient documentation

## 2014-01-20 DIAGNOSIS — F0390 Unspecified dementia without behavioral disturbance: Secondary | ICD-10-CM | POA: Insufficient documentation

## 2014-01-20 DIAGNOSIS — R0602 Shortness of breath: Secondary | ICD-10-CM | POA: Insufficient documentation

## 2014-01-20 DIAGNOSIS — Z9861 Coronary angioplasty status: Secondary | ICD-10-CM | POA: Insufficient documentation

## 2014-01-20 LAB — HEPATIC FUNCTION PANEL
ALT: 9 U/L (ref 0–35)
AST: 14 U/L (ref 0–37)
Albumin: 3.3 g/dL — ABNORMAL LOW (ref 3.5–5.2)
Alkaline Phosphatase: 46 U/L (ref 39–117)
BILIRUBIN TOTAL: 0.3 mg/dL (ref 0.3–1.2)
Total Protein: 6.6 g/dL (ref 6.0–8.3)

## 2014-01-20 LAB — BASIC METABOLIC PANEL
BUN: 12 mg/dL (ref 6–23)
CHLORIDE: 102 meq/L (ref 96–112)
CO2: 23 mEq/L (ref 19–32)
Calcium: 9.3 mg/dL (ref 8.4–10.5)
Creatinine, Ser: 0.86 mg/dL (ref 0.50–1.10)
GFR calc non Af Amer: 58 mL/min — ABNORMAL LOW (ref >90)
GFR, EST AFRICAN AMERICAN: 67 mL/min — AB (ref >90)
Glucose, Bld: 96 mg/dL (ref 70–99)
POTASSIUM: 4 meq/L (ref 3.7–5.3)
Sodium: 138 mEq/L (ref 137–147)

## 2014-01-20 LAB — URINE MICROSCOPIC-ADD ON

## 2014-01-20 LAB — PROTIME-INR
INR: 1.48 (ref 0.00–1.49)
Prothrombin Time: 17.5 seconds — ABNORMAL HIGH (ref 11.6–15.2)

## 2014-01-20 LAB — CBC
HEMATOCRIT: 39.8 % (ref 36.0–46.0)
Hemoglobin: 13.2 g/dL (ref 12.0–15.0)
MCH: 30 pg (ref 26.0–34.0)
MCHC: 33.2 g/dL (ref 30.0–36.0)
MCV: 90.5 fL (ref 78.0–100.0)
Platelets: 264 10*3/uL (ref 150–400)
RBC: 4.4 MIL/uL (ref 3.87–5.11)
RDW: 14.1 % (ref 11.5–15.5)
WBC: 6.1 10*3/uL (ref 4.0–10.5)

## 2014-01-20 LAB — URINALYSIS, ROUTINE W REFLEX MICROSCOPIC
BILIRUBIN URINE: NEGATIVE
Glucose, UA: NEGATIVE mg/dL
KETONES UR: NEGATIVE mg/dL
NITRITE: NEGATIVE
PROTEIN: NEGATIVE mg/dL
Specific Gravity, Urine: 1.014 (ref 1.005–1.030)
UROBILINOGEN UA: 0.2 mg/dL (ref 0.0–1.0)
pH: 5.5 (ref 5.0–8.0)

## 2014-01-20 LAB — PRO B NATRIURETIC PEPTIDE: PRO B NATRI PEPTIDE: 112.2 pg/mL (ref 0–450)

## 2014-01-20 LAB — TROPONIN I

## 2014-01-20 MED ORDER — CEPHALEXIN 500 MG PO CAPS: 500.0000 mg | ORAL_CAPSULE | Freq: Four times a day (QID) | ORAL | Status: DC

## 2014-01-20 MED ORDER — DEXTROSE 5 % IV SOLN
1.0000 g | Freq: Once | INTRAVENOUS | Status: AC
  Administered 2014-01-20: 1 g via INTRAVENOUS
  Filled 2014-01-20: qty 10

## 2014-01-20 MED ORDER — IOHEXOL 350 MG/ML SOLN
50.0000 mL | Freq: Once | INTRAVENOUS | Status: AC | PRN
  Administered 2014-01-20: 50 mL via INTRAVENOUS

## 2014-01-20 MED ORDER — ACETAMINOPHEN 325 MG PO TABS
650.0000 mg | ORAL_TABLET | Freq: Once | ORAL | Status: AC
  Administered 2014-01-20: 650 mg via ORAL
  Filled 2014-01-20: qty 2

## 2014-01-20 MED ORDER — ONDANSETRON HCL 4 MG/2ML IJ SOLN
4.0000 mg | Freq: Once | INTRAMUSCULAR | Status: AC
  Administered 2014-01-20: 4 mg via INTRAVENOUS
  Filled 2014-01-20: qty 2

## 2014-01-20 NOTE — ED Provider Notes (Signed)
CSN: [id number]     Arrival date & time 01/20/14  [id number] History   First MD Initiated Contact with Patient 01/20/14 [phone]     Chief Complaint  Patient presents with  . Chest Pain     (Consider location/radiation/quality/duration/timing/severity/associated sxs/prior Treatment) HPI  Patient with hx CAD, PE, and dementia p/w SOB, chest heaviness that woke her from sleep today.  States she was sleeping and dreamed she was walking down the street with someone, was very SOB - this woke her from sleep.  She had central chest heaviness at the time that has since resolved, she is unsure of how long this lasted.  The SOB persists.  Associated nausea.  States she has not felt well for the past week, noted as generalized weakness, urinating a lot, cramps in legs.  Does have swelling in her lower legs.  Denies fevers, cough, abdominal pain, focal weakness/numbness.  Past Medical History  Diagnosis Date  . CAD (coronary artery disease)     S/p PTCA / stenting (last cath 2004, multiple LAD stents, 2 stents in the right coronary artery all patent)  . 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 Started coumadin  . HTN (hypertension)   . Restless leg syndrome   . PMR (polymyalgia rheumatica)   . Axillary abscess   . Anxiety   . Dementia   . Complication of anesthesia   . PONV (postoperative nausea and vomiting)   . Shortness of breath   . GERD (gastroesophageal reflux disease)   . Cancer    Past Surgical History  Procedure Laterality Date  . Colectomy    . Stent      cardiac x 8 stents.  . Incision and drainage      bilateral axillary, non specific staff  . Incision and drainage abscess  09/28/2012    Procedure: INCISION AND DRAINAGE ABSCESS;  Surgeon: Zenovia Jarred, MD;  Location: [place];  Service: General;  Laterality: Bilateral;  . Esophagogastroduodenoscopy (egd) with esophageal dilation  10/10/2012    Procedure: ESOPHAGOGASTRODUODENOSCOPY (EGD) WITH ESOPHAGEAL DILATION;  Surgeon:  Inda Castle, MD;  Location: [place];  Service: Endoscopy;  Laterality: N/A;  . Breast lumpectomy    . Heart stents  x 8   No family history on file. History  Substance Use Topics  . Smoking status: Never Smoker   . Smokeless tobacco: Never Used  . Alcohol Use: No   OB History   Grav Para Term Preterm Abortions TAB SAB Ect Mult Living                 Review of Systems  Constitutional: Negative for fever.  Respiratory: Positive for shortness of breath. Negative for cough.   Cardiovascular: Positive for chest pain.  Gastrointestinal: Positive for nausea and constipation. Negative for vomiting, abdominal pain and diarrhea.  Genitourinary: Positive for frequency. Negative for dysuria, urgency, vaginal bleeding and vaginal discharge.  Neurological: Positive for weakness (generalized only).  Hematological: Bruises/bleeds easily (on coumadin).  All other systems reviewed and are negative.      Allergies  Codeine phosphate; Darvocet; Promethazine hcl; Sulfamethoxazole; Vicodin; Amoxicillin; and Penicillins  Home Medications   Current Outpatient Rx  Name  Route  Sig  Dispense  Refill  . acetaminophen (TYLENOL) 500 MG tablet   Oral   Take 1,000 mg by mouth 3 (three) times daily as needed for pain. For pain         . amLODipine (NORVASC) 10 MG tablet  Oral   Take 10 mg by mouth every morning.          Marland Kitchen atorvastatin (LIPITOR) 20 MG tablet   Oral   Take 10 mg by mouth every evening.          . benzonatate (TESSALON) 100 MG capsule   Oral   Take 100 mg by mouth 3 (three) times daily as needed for cough.         . cholecalciferol (VITAMIN D) 1000 UNITS tablet   Oral   Take 2,000 Units by mouth daily.         . cholestyramine light (PREVALITE) 4 G packet   Oral   Take 4 g by mouth daily as needed (for loose bowels).         . donepezil (ARICEPT) 10 MG tablet   Oral   Take 10 mg by mouth every morning.         Marland Kitchen esomeprazole (NEXIUM) 40 MG  capsule   Oral   Take 40 mg by mouth 2 (two) times daily as needed (for acid reflux).         . ferrous sulfate 325 (65 FE) MG EC tablet   Oral   Take 325 mg by mouth daily with breakfast.         . LORazepam (ATIVAN) 0.5 MG tablet   Oral   Take 0.25 mg by mouth 3 (three) times daily as needed. for anxiety         . losartan (COZAAR) 50 MG tablet   Oral   Take 50 mg by mouth at bedtime.          . mirabegron ER (MYRBETRIQ) 25 MG TB24   Oral   Take 25 mg by mouth every evening.          . mupirocin ointment (BACTROBAN) 2 %   Topical   Apply topically 3 (three) times daily.         . nitroGLYCERIN (NITROSTAT) 0.4 MG SL tablet   Sublingual   Place 0.4 mg under the tongue every 5 (five) minutes as needed for chest pain.         Marland Kitchen nystatin (MYCOSTATIN) powder      Apply to affected area 3 times daily   15 g   0   . ondansetron (ZOFRAN) 4 MG tablet   Oral   Take 1 tablet (4 mg total) by mouth every 8 (eight) hours as needed for nausea.   15 tablet   2   . Pediatric Multiple Vit-C-FA (CHILDRENS CHEWABLE MULTI VITS PO)   Oral   Take 1 tablet by mouth daily.         . potassium chloride SA (K-DUR,KLOR-CON) 20 MEQ tablet   Oral   Take 20 mEq by mouth daily.         . predniSONE (DELTASONE) 1 MG tablet   Oral   Take 2 mg by mouth every morning.          Marland Kitchen rOPINIRole (REQUIP) 1 MG tablet   Oral   Take 0.5-1 mg by mouth 3 (three) times daily. 1 tab at noon, 1/2 tab late afternoon, 1/2 tab one-three hours before bed         . traMADol (ULTRAM) 50 MG tablet   Oral   Take 25 mg by mouth 3 (three) times daily as needed for pain.          Marland Kitchen warfarin (COUMADIN) 2.5 MG tablet   Oral  Take 2.5 mg by mouth every evening.          . zaleplon (SONATA) 10 MG capsule   Oral   Take 1 capsule (10 mg total) by mouth at bedtime. Facility MD to refill   10 capsule   0    BP 168/58  Pulse 52  Temp(Src) 97.5 F (36.4 C) (Oral)  Resp 20  Ht 5\' 2"   (1.575 m)  SpO2 99% Physical Exam  Nursing note and vitals reviewed. Constitutional: She appears well-developed and well-nourished. No distress.  HENT:  Head: Normocephalic and atraumatic.  Eyes: Conjunctivae are normal.  Neck: Normal range of motion. Neck supple.  Cardiovascular: Normal rate, regular rhythm and intact distal pulses.   Pulmonary/Chest: Effort normal and breath sounds normal. No stridor. No respiratory distress. She has no wheezes. She has no rales.  Abdominal: Soft. She exhibits no distension. There is no tenderness. There is no rebound and no guarding.  Musculoskeletal: She exhibits edema.  Bilateral lower extremity nonpitting edema, symmetric  Neurological: She is alert. She has normal strength. No sensory deficit. GCS eye subscore is 4. GCS verbal subscore is 5. GCS motor subscore is 6.  Oriented to self, location (hospital, [facility]).  States it is February or March.  Does not know the day of the week/date, or year.   Does know the [place] President is Obama.    Skin: She is not diaphoretic.  Psychiatric: She has a normal mood and affect. Her behavior is normal.    ED Course  Procedures (including critical care time) Labs Review Labs Reviewed  BASIC METABOLIC PANEL - Abnormal; Notable for the following:    GFR calc non Af Amer 58 (*)    GFR calc Af Amer 67 (*)    All other components within normal limits  PROTIME-INR - Abnormal; Notable for the following:    Prothrombin Time 17.5 (*)    All other components within normal limits  URINALYSIS, ROUTINE W REFLEX MICROSCOPIC - Abnormal; Notable for the following:    APPearance CLOUDY (*)    Hgb urine dipstick TRACE (*)    Leukocytes, UA SMALL (*)    All other components within normal limits  HEPATIC FUNCTION PANEL - Abnormal; Notable for the following:    Albumin 3.3 (*)    All other components within normal limits  URINE MICROSCOPIC-ADD ON - Abnormal; Notable for the following:    Bacteria, UA MANY (*)    All  other components within normal limits  CBC  TROPONIN I  PRO B NATRIURETIC PEPTIDE  HEPATIC FUNCTION PANEL   Imaging Review Dg Chest 2 View (if Patient Has Fever And/or Copd)  01/20/2014   CLINICAL DATA:  Chest pain  EXAM: CHEST  2 VIEW  COMPARISON:  DG CHEST 2 VIEW dated 04/27/2013  FINDINGS: Right hemidiaphragm remains chronically elevated. Lungs are clear. Normal heart size. No pneumothorax. Stable thoracic spine.  IMPRESSION: No active cardiopulmonary disease.   Electronically Signed   By: Maryclare Bean M.D.   On: 01/20/2014 10:07   Ct Angio Chest Pe W/cm &/or Wo Cm  01/20/2014   CLINICAL DATA:  Coronary artery disease.  Left chest pain.  EXAM: CT ANGIOGRAPHY CHEST WITH CONTRAST  TECHNIQUE: Multidetector CT imaging of the chest was performed using the standard protocol during bolus administration of intravenous contrast. Multiplanar CT image reconstructions and MIPs were obtained to evaluate the vascular anatomy.  CONTRAST:  55mL OMNIPAQUE IOHEXOL 350 MG/ML SOLN  COMPARISON:  CT CHEST W/CM dated 06/09/2012  FINDINGS: There are no filling defects in the pulmonary arterial tree to suggest acute pulmonary thromboembolism.  No abnormal mediastinal adenopathy or pericardial effusion. Minimal coronary artery calcifications in the LAD and circumflex. Right coronary artery calcifications are also present.  No pneumothorax. No pleural effusion. Bibasilar atelectasis right greater than left.  Stable T9 and T12 thoracic compression deformities.  Review of the MIP images confirms the above findings.  IMPRESSION: No evidence of acute pulmonary thromboembolism.   Electronically Signed   By: Maryclare Bean M.D.   On: 01/20/2014 12:17     EKG Interpretation   Date/Time:  Sunday January 20 2014 08:42:21 EDT Ventricular Rate:  51 PR Interval:  167 QRS Duration: 79 QT Interval:  424 QTC Calculation: 390 R Axis:   51 Text Interpretation:  Sinus rhythm No significant change was found  Confirmed by CAMPOS  MD, Lennette Bihari ([id number])  on 01/20/2014 8:50:15 AM      Discussed pt with Dr Venora Maples.   MDM   Final diagnoses:  UTI (lower urinary tract infection)  Dyspnea  Generalized weakness    Patient with several days of generalized weakness and not feeling well followed by waking up this morning short of breath found to have urinary tract infection. Also a subtherapeutic INR but without pulmonary embolism or evidence of DVT on exam. Per son, patient has these symptoms whenever she has a urinary tract infection, including dyspnea. Patient given Rocephin here and discharged home with Keflex. Recent urinary culture reviewed. INR is subtherapeutic. Discussed Coumadin changes with Dr. Venora Maples. Patient to take 1.5 doses today and resume normal schedule tomorrow with close consultation with primary care provider. I discussed this with the son who agrees with the plan. Both patient and son are comfortable with discharge home with antibiotics. Patient lives at home with his son with in home health care.  Discussed result, findings, treatment, and follow up  with patient.  Pt given return precautions.  Pt verbalizes understanding and agrees with plan.        Clayton Bibles, PA-C 01/20/14 1306

## 2014-01-20 NOTE — ED Provider Notes (Signed)
Medical screening examination/treatment/procedure(s) were conducted as a shared visit with non-physician practitioner(s) and myself.  I personally evaluated the patient during the encounter.   EKG Interpretation   Date/Time:  Sunday January 20 2014 08:42:21 EDT Ventricular Rate:  51 PR Interval:  167 QRS Duration: 79 QT Interval:  424 QTC Calculation: 390 R Axis:   51 Text Interpretation:  Sinus rhythm No significant change was found  Confirmed by Eddie Payette  MD, Quentina Fronek ([id number]) on 01/20/2014 8:50:15 AM      Well appearing. Will tx UTI. Refer back to her team managing her INR dose tonight will be 7.5mg  of coumadin. All questions answered. Dc home in good condition   Hoy Morn, MD 01/20/14 1315

## 2014-01-20 NOTE — Discharge Instructions (Signed)
Read the information below.  Use the prescribed medication as directed.  Please discuss all new medications with your pharmacist.  You may return to the Emergency Department at any time for worsening condition or any new symptoms that concern you.  If you develop high fevers, abdominal pain, vomiting, or are unable to tolerate fluids by mouth, return to the ER for a recheck.  If you develop chest pain, worsening shortness of breath, you pass out, or become weak or dizzy, return to the ER for a recheck.     Take 1 1/2 dose of Coumadin today and call your doctor tomorrow to discuss your coumadin dosage and scheduling to recheck your INR.  Resume your normal dose after today unless instructed otherwise by your primary care provider.    Urinary Tract Infection Urinary tract infections (UTIs) can develop anywhere along your urinary tract. Your urinary tract is your body's drainage system for removing wastes and extra water. Your urinary tract includes two kidneys, two ureters, a bladder, and a urethra. Your kidneys are a pair of bean-shaped organs. Each kidney is about the size of your fist. They are located below your ribs, one on each side of your spine. CAUSES Infections are caused by microbes, which are microscopic organisms, including fungi, viruses, and bacteria. These organisms are so small that they can only be seen through a microscope. Bacteria are the microbes that most commonly cause UTIs. SYMPTOMS  Symptoms of UTIs may vary by age and gender of the patient and by the location of the infection. Symptoms in young women typically include a frequent and intense urge to urinate and a painful, burning feeling in the bladder or urethra during urination. Older women and men are more likely to be tired, shaky, and weak and have muscle aches and abdominal pain. A fever may mean the infection is in your kidneys. Other symptoms of a kidney infection include pain in your back or sides below the ribs, nausea,  and vomiting. DIAGNOSIS To diagnose a UTI, your caregiver will ask you about your symptoms. Your caregiver also will ask to provide a urine sample. The urine sample will be tested for bacteria and white blood cells. White blood cells are made by your body to help fight infection. TREATMENT  Typically, UTIs can be treated with medication. Because most UTIs are caused by a bacterial infection, they usually can be treated with the use of antibiotics. The choice of antibiotic and length of treatment depend on your symptoms and the type of bacteria causing your infection. HOME CARE INSTRUCTIONS  If you were prescribed antibiotics, take them exactly as your caregiver instructs you. Finish the medication even if you feel better after you have only taken some of the medication.  Drink enough water and fluids to keep your urine clear or pale yellow.  Avoid caffeine, tea, and carbonated beverages. They tend to irritate your bladder.  Empty your bladder often. Avoid holding urine for long periods of time.  Empty your bladder before and after sexual intercourse.  After a bowel movement, women should cleanse from front to back. Use each tissue only once. SEEK MEDICAL CARE IF:   You have back pain.  You develop a fever.  Your symptoms do not begin to resolve within 3 days. SEEK IMMEDIATE MEDICAL CARE IF:   You have severe back pain or lower abdominal pain.  You develop chills.  You have nausea or vomiting.  You have continued burning or discomfort with urination. MAKE SURE YOU:  Understand these instructions.  Will watch your condition.  Will get help right away if you are not doing well or get worse. Document Released: 07/28/2005 Document Revised: 04/18/2012 Document Reviewed: 11/26/2011 [facility] Patient Information 2014 [place].  Shortness of Breath Shortness of breath means you have trouble breathing. Shortness of breath may indicate that you have a medical problem. You  should seek immediate medical care for shortness of breath. CAUSES   Not enough oxygen in the air (as with high altitudes or a smoke-filled room).  Short-term (acute) lung disease, including:  Infections, such as pneumonia.  Fluid in the lungs, such as heart failure.  A blood clot in the lungs (pulmonary embolism).  Long-term (chronic) lung diseases.  Heart disease (heart attack, angina, heart failure, and others).  Low red blood cells (anemia).  Poor physical fitness. This can cause shortness of breath when you exercise.  Chest or back injuries or stiffness.  Being overweight.  Smoking.  Anxiety. This can make you feel like you are not getting enough air. DIAGNOSIS  Serious medical problems can usually be found during your physical exam. Tests may also be done to determine why you are having shortness of breath. Tests may include:  Chest X-rays.  Lung function tests.  Blood tests.  Electrocardiography.  Exercise testing.  Echocardiography.  Imaging scans. Your caregiver may not be able to find a cause for your shortness of breath after your exam. In this case, it is important to have a follow-up exam with your caregiver as directed.  TREATMENT  Treatment for shortness of breath depends on the cause of your symptoms and can vary greatly. HOME CARE INSTRUCTIONS   Do not smoke. Smoking is a common cause of shortness of breath. If you smoke, ask for help to quit.  Avoid being around chemicals or things that may bother your breathing, such as paint fumes and dust.  Rest as needed. Slowly resume your usual activities.  If medicines were prescribed, take them as directed for the full length of time directed. This includes oxygen and any inhaled medicines.  Keep all follow-up appointments as directed by your caregiver. SEEK MEDICAL CARE IF:   Your condition does not improve in the time expected.  You have a hard time doing your normal activities even with  rest.  You have any side effects or problems with the medicines prescribed.  You develop any new symptoms. SEEK IMMEDIATE MEDICAL CARE IF:   Your shortness of breath gets worse.  You feel lightheaded, faint, or develop a cough not controlled with medicines.  You start coughing up blood.  You have pain with breathing.  You have chest pain or pain in your arms, shoulders, or abdomen.  You have a fever.  You are unable to walk up stairs or exercise the way you normally do. MAKE SURE YOU:  Understand these instructions.  Will watch your condition.  Will get help right away if you are not doing well or get worse. Document Released: 07/13/2001 Document Revised: 04/18/2012 Document Reviewed: 01/03/2012 [facility] Patient Information 2014 [place].

## 2014-01-20 NOTE — ED Notes (Signed)
Received pt from home with c/o left sided chest pain that radiated to left shoulder onset today upon awakening. Pain was rated 10/10 at onset. Pt took 500mg  of tylenol that relieved chest pain and made pain in left shoulder 2/10. Pt given 324 mg of ASA by EMS. Pt was sinus brady for EMS during transport. Pt also reports generalized weakness and B/L leg swelling x 1 week.

## 2014-02-28 ENCOUNTER — Ambulatory Visit: Payer: Medicare Other | Admitting: Cardiology

## 2014-02-28 ENCOUNTER — Observation Stay (HOSPITAL_COMMUNITY)
Admission: EM | Admit: 2014-02-28 | Discharge: 2014-03-01 | Disposition: A | Discharge status: Home / Self Care | Payer: Medicare Other | Attending: Internal Medicine | Admitting: Internal Medicine

## 2014-02-28 ENCOUNTER — Emergency Department (HOSPITAL_COMMUNITY): Payer: Medicare Other

## 2014-02-28 ENCOUNTER — Encounter (HOSPITAL_COMMUNITY): Payer: Self-pay | Admitting: Emergency Medicine

## 2014-02-28 DIAGNOSIS — Z9861 Coronary angioplasty status: Secondary | ICD-10-CM | POA: Insufficient documentation

## 2014-02-28 DIAGNOSIS — Z79899 Other long term (current) drug therapy: Secondary | ICD-10-CM | POA: Insufficient documentation

## 2014-02-28 DIAGNOSIS — R0789 Other chest pain: Principal | ICD-10-CM | POA: Insufficient documentation

## 2014-02-28 DIAGNOSIS — M353 Polymyalgia rheumatica: Secondary | ICD-10-CM | POA: Insufficient documentation

## 2014-02-28 DIAGNOSIS — R1310 Dysphagia, unspecified: Secondary | ICD-10-CM

## 2014-02-28 DIAGNOSIS — Z872 Personal history of diseases of the skin and subcutaneous tissue: Secondary | ICD-10-CM | POA: Insufficient documentation

## 2014-02-28 DIAGNOSIS — I10 Essential (primary) hypertension: Secondary | ICD-10-CM

## 2014-02-28 DIAGNOSIS — R0602 Shortness of breath: Secondary | ICD-10-CM | POA: Insufficient documentation

## 2014-02-28 DIAGNOSIS — K219 Gastro-esophageal reflux disease without esophagitis: Secondary | ICD-10-CM | POA: Insufficient documentation

## 2014-02-28 DIAGNOSIS — R609 Edema, unspecified: Secondary | ICD-10-CM | POA: Insufficient documentation

## 2014-02-28 DIAGNOSIS — I8290 Acute embolism and thrombosis of unspecified vein: Secondary | ICD-10-CM

## 2014-02-28 DIAGNOSIS — I749 Embolism and thrombosis of unspecified artery: Secondary | ICD-10-CM

## 2014-02-28 DIAGNOSIS — F411 Generalized anxiety disorder: Secondary | ICD-10-CM | POA: Insufficient documentation

## 2014-02-28 DIAGNOSIS — G2581 Restless legs syndrome: Secondary | ICD-10-CM | POA: Insufficient documentation

## 2014-02-28 DIAGNOSIS — Z7901 Long term (current) use of anticoagulants: Secondary | ICD-10-CM | POA: Insufficient documentation

## 2014-02-28 DIAGNOSIS — Z792 Long term (current) use of antibiotics: Secondary | ICD-10-CM | POA: Insufficient documentation

## 2014-02-28 DIAGNOSIS — Z859 Personal history of malignant neoplasm, unspecified: Secondary | ICD-10-CM | POA: Insufficient documentation

## 2014-02-28 DIAGNOSIS — Z880 Allergy status to penicillin: Secondary | ICD-10-CM | POA: Insufficient documentation

## 2014-02-28 DIAGNOSIS — R079 Chest pain, unspecified: Secondary | ICD-10-CM

## 2014-02-28 DIAGNOSIS — I2510 Atherosclerotic heart disease of native coronary artery without angina pectoris: Secondary | ICD-10-CM | POA: Diagnosis present

## 2014-02-28 DIAGNOSIS — K222 Esophageal obstruction: Secondary | ICD-10-CM | POA: Diagnosis present

## 2014-02-28 DIAGNOSIS — F0390 Unspecified dementia without behavioral disturbance: Secondary | ICD-10-CM | POA: Insufficient documentation

## 2014-02-28 LAB — COMPREHENSIVE METABOLIC PANEL WITH GFR
ALT: 11 U/L (ref 0–35)
Alkaline Phosphatase: 41 U/L (ref 39–117)
BUN: 15 mg/dL (ref 6–23)
CO2: 22 meq/L (ref 19–32)
Calcium: 9.2 mg/dL (ref 8.4–10.5)
GFR calc Af Amer: 56 mL/min — ABNORMAL LOW (ref >90)
GFR calc non Af Amer: 49 mL/min — ABNORMAL LOW (ref >90)
Sodium: 140 meq/L (ref 137–147)
Total Protein: 6.2 g/dL (ref 6.0–8.3)

## 2014-02-28 LAB — CBC WITH DIFFERENTIAL/PLATELET
Basophils Absolute: 0 10*3/uL (ref 0.0–0.1)
Basophils Relative: 1 % (ref 0–1)
Eosinophils Absolute: 0.2 10*3/uL (ref 0.0–0.7)
Eosinophils Relative: 2 % (ref 0–5)
HCT: 40.7 % (ref 36.0–46.0)
Hemoglobin: 12.8 g/dL (ref 12.0–15.0)
Lymphocytes Relative: 26 % (ref 12–46)
Lymphs Abs: 1.8 K/uL (ref 0.7–4.0)
MCH: 29 pg (ref 26.0–34.0)
MCHC: 31.4 g/dL (ref 30.0–36.0)
MCV: 92.1 fL (ref 78.0–100.0)
Monocytes Absolute: 0.5 10*3/uL (ref 0.1–1.0)
Monocytes Relative: 8 % (ref 3–12)
Neutro Abs: 4.2 10*3/uL (ref 1.7–7.7)
Neutrophils Relative %: 63 % (ref 43–77)
Platelets: 247 K/uL (ref 150–400)
RBC: 4.42 MIL/uL (ref 3.87–5.11)
RDW: 13.9 % (ref 11.5–15.5)
WBC: 6.7 K/uL (ref 4.0–10.5)

## 2014-02-28 LAB — PROTIME-INR
INR: 2.02 — AB (ref 0.00–1.49)
Prothrombin Time: 22.2 seconds — ABNORMAL HIGH (ref 11.6–15.2)

## 2014-02-28 LAB — COMPREHENSIVE METABOLIC PANEL
AST: 16 U/L (ref 0–37)
Albumin: 3.2 g/dL — ABNORMAL LOW (ref 3.5–5.2)
Chloride: 103 mEq/L (ref 96–112)
Creatinine, Ser: 0.99 mg/dL (ref 0.50–1.10)
Glucose, Bld: 122 mg/dL — ABNORMAL HIGH (ref 70–99)
Potassium: 3.5 mEq/L — ABNORMAL LOW (ref 3.7–5.3)
Total Bilirubin: 0.3 mg/dL (ref 0.3–1.2)

## 2014-02-28 LAB — TROPONIN I
Troponin I: <0.3 ng/mL (ref <0.30)
Troponin I: <0.3 ng/mL (ref <0.30)

## 2014-02-28 LAB — PRO B NATRIURETIC PEPTIDE: Pro B Natriuretic peptide (BNP): 100.5 pg/mL (ref 0–450)

## 2014-02-28 MED ORDER — CHILDRENS CHEWABLE MULTI VITS PO CHEW: 1.0000 | CHEWABLE_TABLET | Freq: Every day | ORAL | Status: DC

## 2014-02-28 MED ORDER — ASPIRIN EC 81 MG PO TBEC
81.0000 mg | DELAYED_RELEASE_TABLET | Freq: Every day | ORAL | Status: DC
  Administered 2014-02-28 – 2014-03-01 (×2): 81 mg via ORAL
  Filled 2014-02-28 (×2): qty 1

## 2014-02-28 MED ORDER — ONDANSETRON HCL 4 MG/2ML IJ SOLN: 4.0000 mg | Freq: Four times a day (QID) | INTRAMUSCULAR | Status: DC | PRN

## 2014-02-28 MED ORDER — FERROUS SULFATE 325 (65 FE) MG PO TABS
325.0000 mg | ORAL_TABLET | Freq: Every day | ORAL | Status: DC
  Administered 2014-03-01: 325 mg via ORAL
  Filled 2014-02-28 (×2): qty 1

## 2014-02-28 MED ORDER — NITROGLYCERIN 0.4 MG SL SUBL: 0.4000 mg | SUBLINGUAL_TABLET | SUBLINGUAL | Status: DC | PRN

## 2014-02-28 MED ORDER — POTASSIUM CHLORIDE CRYS ER 20 MEQ PO TBCR
20.0000 meq | EXTENDED_RELEASE_TABLET | Freq: Every day | ORAL | Status: DC
  Administered 2014-02-28 – 2014-03-01 (×2): 20 meq via ORAL
  Filled 2014-02-28 (×2): qty 1

## 2014-02-28 MED ORDER — WARFARIN - PHARMACIST DOSING INPATIENT: Freq: Every day | Status: DC

## 2014-02-28 MED ORDER — FENTANYL CITRATE 0.05 MG/ML IJ SOLN: 12.5000 ug | INTRAMUSCULAR | Status: DC | PRN

## 2014-02-28 MED ORDER — CHOLESTYRAMINE LIGHT 4 G PO PACK
4.0000 g | PACK | Freq: Every day | ORAL | Status: DC | PRN
  Filled 2014-02-28: qty 1

## 2014-02-28 MED ORDER — TRAMADOL HCL 50 MG PO TABS
25.0000 mg | ORAL_TABLET | Freq: Three times a day (TID) | ORAL | Status: DC | PRN
  Administered 2014-03-01: 25 mg via ORAL
  Filled 2014-02-28: qty 1

## 2014-02-28 MED ORDER — DOXEPIN HCL 6 MG PO TABS
6.0000 mg | ORAL_TABLET | Freq: Every evening | ORAL | Status: DC | PRN
  Administered 2014-02-28: 6 mg via ORAL
  Filled 2014-02-28: qty 1

## 2014-02-28 MED ORDER — PREDNISONE 1 MG PO TABS
2.0000 mg | ORAL_TABLET | Freq: Every day | ORAL | Status: DC
  Administered 2014-03-01: 2 mg via ORAL
  Filled 2014-02-28 (×2): qty 2

## 2014-02-28 MED ORDER — DONEPEZIL HCL 10 MG PO TABS
10.0000 mg | ORAL_TABLET | Freq: Every day | ORAL | Status: DC
  Administered 2014-03-01: 10 mg via ORAL
  Filled 2014-02-28 (×2): qty 1

## 2014-02-28 MED ORDER — VITAMIN D3 25 MCG (1000 UNIT) PO TABS
2000.0000 [IU] | ORAL_TABLET | Freq: Every day | ORAL | Status: DC
  Administered 2014-03-01: 2000 [IU] via ORAL
  Filled 2014-02-28: qty 2

## 2014-02-28 MED ORDER — NITROGLYCERIN 0.4 MG SL SUBL
0.4000 mg | SUBLINGUAL_TABLET | SUBLINGUAL | Status: AC | PRN
  Administered 2014-02-28 (×2): 0.4 mg via SUBLINGUAL
  Filled 2014-02-28: qty 1

## 2014-02-28 MED ORDER — POTASSIUM CHLORIDE CRYS ER 20 MEQ PO TBCR
20.0000 meq | EXTENDED_RELEASE_TABLET | Freq: Once | ORAL | Status: AC
  Administered 2014-02-28: 20 meq via ORAL
  Filled 2014-02-28: qty 1

## 2014-02-28 MED ORDER — NON FORMULARY: 6.0000 mg | Freq: Every evening | Status: DC | PRN

## 2014-02-28 MED ORDER — SENNOSIDES-DOCUSATE SODIUM 8.6-50 MG PO TABS
1.0000 | ORAL_TABLET | Freq: Every evening | ORAL | Status: DC | PRN
  Filled 2014-02-28: qty 1

## 2014-02-28 MED ORDER — ONDANSETRON HCL 4 MG PO TABS: 4.0000 mg | ORAL_TABLET | Freq: Four times a day (QID) | ORAL | Status: DC | PRN

## 2014-02-28 MED ORDER — ROPINIROLE HCL 1 MG PO TABS
1.0000 mg | ORAL_TABLET | Freq: Three times a day (TID) | ORAL | Status: DC
  Administered 2014-02-28 – 2014-03-01 (×4): 1 mg via ORAL
  Filled 2014-02-28 (×5): qty 1

## 2014-02-28 MED ORDER — NITROGLYCERIN 0.4 MG SL SUBL
0.4000 mg | SUBLINGUAL_TABLET | SUBLINGUAL | Status: AC | PRN
  Administered 2014-02-28: 0.4 mg via SUBLINGUAL

## 2014-02-28 MED ORDER — SODIUM CHLORIDE 0.9 % IJ SOLN
3.0000 mL | Freq: Two times a day (BID) | INTRAMUSCULAR | Status: DC
  Administered 2014-02-28 (×2): 3 mL via INTRAVENOUS

## 2014-02-28 MED ORDER — SODIUM CHLORIDE 0.9 % IV SOLN: 250.0000 mL | INTRAVENOUS | Status: DC | PRN

## 2014-02-28 MED ORDER — ADULT MULTIVITAMIN W/MINERALS CH
1.0000 | ORAL_TABLET | Freq: Every day | ORAL | Status: DC
  Administered 2014-03-01: 1 via ORAL
  Filled 2014-02-28: qty 1

## 2014-02-28 MED ORDER — LORAZEPAM 0.5 MG PO TABS
0.2500 mg | ORAL_TABLET | Freq: Every day | ORAL | Status: DC
  Administered 2014-02-28: 0.25 mg via ORAL
  Filled 2014-02-28: qty 1

## 2014-02-28 MED ORDER — SODIUM CHLORIDE 0.9 % IJ SOLN
3.0000 mL | Freq: Two times a day (BID) | INTRAMUSCULAR | Status: DC
  Administered 2014-03-01: 3 mL via INTRAVENOUS

## 2014-02-28 MED ORDER — NYSTATIN 100000 UNIT/GM EX POWD
Freq: Two times a day (BID) | CUTANEOUS | Status: DC
  Administered 2014-02-28: 22:00:00 via TOPICAL
  Filled 2014-02-28: qty 15

## 2014-02-28 MED ORDER — DONEPEZIL HCL 10 MG PO TABS
10.0000 mg | ORAL_TABLET | Freq: Every day | ORAL | Status: DC
  Filled 2014-02-28: qty 1

## 2014-02-28 MED ORDER — PANTOPRAZOLE SODIUM 40 MG PO TBEC
80.0000 mg | DELAYED_RELEASE_TABLET | Freq: Every day | ORAL | Status: DC
  Administered 2014-03-01: 80 mg via ORAL
  Filled 2014-02-28: qty 2

## 2014-02-28 MED ORDER — SODIUM CHLORIDE 0.9 % IJ SOLN: 3.0000 mL | INTRAMUSCULAR | Status: DC | PRN

## 2014-02-28 MED ORDER — ATORVASTATIN CALCIUM 10 MG PO TABS
10.0000 mg | ORAL_TABLET | Freq: Every evening | ORAL | Status: DC
  Administered 2014-02-28 – 2014-03-01 (×2): 10 mg via ORAL
  Filled 2014-02-28 (×2): qty 1

## 2014-02-28 MED ORDER — MIRABEGRON ER 25 MG PO TB24
25.0000 mg | ORAL_TABLET | Freq: Every evening | ORAL | Status: DC
  Administered 2014-02-28 – 2014-03-01 (×2): 25 mg via ORAL
  Filled 2014-02-28 (×2): qty 1

## 2014-02-28 MED ORDER — ACETAMINOPHEN 500 MG PO TABS: 500.0000 mg | ORAL_TABLET | Freq: Three times a day (TID) | ORAL | Status: DC | PRN

## 2014-02-28 MED ORDER — FERROUS SULFATE 325 (65 FE) MG PO TBEC: 325.0000 mg | DELAYED_RELEASE_TABLET | Freq: Every day | ORAL | Status: DC

## 2014-02-28 MED ORDER — NITROGLYCERIN 0.4 MG SL SUBL
SUBLINGUAL_TABLET | SUBLINGUAL | Status: AC
  Filled 2014-02-28: qty 1

## 2014-02-28 MED ORDER — WARFARIN SODIUM 2.5 MG PO TABS
2.5000 mg | ORAL_TABLET | Freq: Once | ORAL | Status: AC
  Administered 2014-02-28: 2.5 mg via ORAL
  Filled 2014-02-28: qty 1

## 2014-02-28 MED ORDER — ALBUTEROL SULFATE (2.5 MG/3ML) 0.083% IN NEBU: 2.5000 mg | INHALATION_SOLUTION | RESPIRATORY_TRACT | Status: DC | PRN

## 2014-02-28 NOTE — ED Notes (Signed)
Pt denies dizziness or lightheadedness. Pt is alert and oriented. Will notify MD of pt's drop in BP after nitro.

## 2014-02-28 NOTE — Progress Notes (Signed)
ANTICOAGULATION CONSULT NOTE - Initial Consult  Pharmacy Consult for Coumadin Indication: hx DVT and PE (2011)  Allergies  Allergen Reactions  . Codeine Phosphate Nausea And Vomiting  . Darvocet [Propoxyphene N-Acetaminophen] Nausea And Vomiting  . Promethazine Hcl Nausea And Vomiting  . Sulfamethoxazole Other (See Comments)    Pt doesn't remember reaction  . Vicodin [Hydrocodone-Acetaminophen] Other (See Comments)    Unknown reaction  . Amoxicillin Nausea And Vomiting and Rash  . Penicillins Nausea And Vomiting and Rash    Patient Measurements: Height: 5\' 2"  (157.5 cm) Weight: 147 lb 12.8 oz (67.042 kg) IBW/kg (Calculated) : 50.1  Vital Signs: Temp: 97.8 F (36.6 C) (04/30 1500) Temp src: Oral (04/30 1500) BP: 133/56 mmHg (04/30 1500) Pulse Rate: 60 (04/30 1500)  Labs:  Recent Labs  02/28/14 1050 02/28/14 1545  HGB 12.8  --   HCT 40.7  --   PLT 247  --   LABPROT  --  22.2*  INR  --  2.02*  CREATININE 0.99  --   TROPONINI <0.30 <0.30    Estimated Creatinine Clearance: 33.9 ml/min (by C-G formula based on Cr of 0.99).   Medical History: Past Medical History  Diagnosis Date  . CAD (coronary artery disease)     S/p PTCA / stenting (last cath 2004, multiple LAD stents, 2 stents in the right coronary artery all patent)  . 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 Started coumadin  . HTN (hypertension)   . Restless leg syndrome   . PMR (polymyalgia rheumatica)   . Axillary abscess   . Anxiety   . Dementia   . Complication of anesthesia   . PONV (postoperative nausea and vomiting)   . Shortness of breath   . GERD (gastroesophageal reflux disease)   . Cancer    Assessment:   80 yr old woman admitted with chest pain and dyspnea.  Has been on Coumadin for hx DVT and PE in 2011.   INR low therapeutic (2.02).  Home regimen:  Coumadin 2.5 mg daily, except 1.25 mg on Saturdays.  Has not taken today's dose yet.  Goal of Therapy:  INR 2-3 Monitor  platelets by anticoagulation protocol: Yes   Plan:   Coumadin 2.5 mg tonight per usual home dose.  Daily PT/INR for now.  Arty Baumgartner, [place] Pager: [phone] 02/28/2014,5:01 PM

## 2014-02-28 NOTE — Progress Notes (Signed)
02/28/14 1600  SLP G-Codes **NOT FOR INPATIENT CLASS**  Functional Assessment Tool Used clinical judgement  Functional Limitations Swallowing  Swallow Current Status (Z6109) CI  Swallow Goal Status (U0454) CI  Swallow Discharge Status (U9811) CI  SLP Evaluations  $ SLP Speech Visit 1 Procedure  SLP Evaluations  $BSS Swallow 1 Procedure  $Swallowing Treatment 1 Procedure

## 2014-02-28 NOTE — Evaluation (Signed)
Clinical/Bedside Swallow Evaluation Patient Details  Name: [name] [name] MRN: [id number] Date of Birth: 01/31/1921  Today's Date: 02/28/2014 Time: 3716-9678 SLP Time Calculation (min): 13 min  Past Medical History:  Past Medical History  Diagnosis Date  . CAD (coronary artery disease)     S/p PTCA / stenting (last cath 2004, multiple LAD stents, 2 stents in the right coronary artery all patent)  . 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 Started coumadin  . HTN (hypertension)   . Restless leg syndrome   . PMR (polymyalgia rheumatica)   . Axillary abscess   . Anxiety   . Dementia   . Complication of anesthesia   . PONV (postoperative nausea and vomiting)   . Shortness of breath   . GERD (gastroesophageal reflux disease)   . Cancer    Past Surgical History:  Past Surgical History  Procedure Laterality Date  . Colectomy    . Stent      cardiac x 8 stents.  . Incision and drainage      bilateral axillary, non specific staff  . Incision and drainage abscess  09/28/2012    Procedure: INCISION AND DRAINAGE ABSCESS;  Surgeon: Zenovia Jarred, MD;  Location: [place];  Service: General;  Laterality: Bilateral;  . Esophagogastroduodenoscopy (egd) with esophageal dilation  10/10/2012    Procedure: ESOPHAGOGASTRODUODENOSCOPY (EGD) WITH ESOPHAGEAL DILATION;  Surgeon: Inda Castle, MD;  Location: [place];  Service: Endoscopy;  Laterality: N/A;  . Breast lumpectomy    . Heart stents  x 8   HPI:  Patient is a 80 y.o. female presenting with shortness of breath and chest pain. There is concern for a PE. CXR does not reveal any significant findings. Pt lives at home with her son. Pt has a history of GERD.    Assessment / Plan / Recommendation Clinical Impression  Pt demonstrates adequate tolerance of regular solids and thin liquids. She reports occasional globus, but no regurgitation, worsening over the past two weeks. She is not aware of a dx of GERD though an  esophagram in 2013 demonstrated GER per the radiologist. She has not been taking any medications. Provided basic strategies to improve esophageal transit. Pt may initiate a regular diet and thin liquids, SLP will sign off.     Aspiration Risk  Mild    Diet Recommendation Regular;Thin liquid   Liquid Administration via: Cup;Straw Medication Administration: Whole meds with liquid Supervision: Patient able to self feed Compensations: Follow solids with liquid Postural Changes and/or Swallow Maneuvers: Seated upright 90 degrees    Other  Recommendations Oral Care Recommendations: Oral care BID   Follow Up Recommendations  None    Frequency and Duration        Pertinent Vitals/Pain NA    SLP Swallow Goals     Swallow Study Prior Functional Status       General HPI: Patient is a 80 y.o. female presenting with shortness of breath and chest pain. There is concern for a PE. CXR does not reveal any significant findings. Pt lives at home with her son. Pt has a history of GERD.  Type of Study: Bedside swallow evaluation Previous Swallow Assessment: Esophagram 2013, GERD Diet Prior to this Study: NPO Temperature Spikes Noted: No Respiratory Status: Room air History of Recent Intubation: No Behavior/Cognition: Alert;Cooperative;Pleasant mood Oral Cavity - Dentition: Poor condition Self-Feeding Abilities: Able to feed self Patient Positioning: Upright in bed Baseline Vocal Quality: Clear Volitional Cough: Strong Volitional Swallow: Able to  elicit    Oral/Motor/Sensory Function Overall Oral Motor/Sensory Function: Appears within functional limits for tasks assessed   Ice Chips     Thin Liquid Thin Liquid: Within functional limits Presentation: Cup;Straw;Self Fed    Nectar Thick Nectar Thick Liquid: Not tested   Honey Thick Honey Thick Liquid: Not tested   Puree Puree: Within functional limits   Solid   GO    Solid: Within functional limits      [facility], [place] CCC-SLP  [place] Marzetta Lanza 02/28/2014,4:37 PM

## 2014-02-28 NOTE — H&P (Signed)
[facility] History and Physical  MERCADIES CO PIR:[id number] DOB: 11/04/1920 DOA: 02/28/2014  Referring physician: Dr Dorna Mai PCP: Horatio Pel, MD   Chief Complaint: chest pain and dyspnea.   HPI: [name] [name] is a 80 y.o. female with PMH significant for CAD S/P stent last Cath 2004, History of PE, DVT, hypertension who presents complaining of SOB and chest pain. Patient relates dyspnea on exertion for last week. She is not even able to walk more than 25 feet without getting SOB. She started to have chest pain this morning, pressure like, 8/10, accompanied by dyspnea, and tremors. She also relates pain in her shoulder blade but that is a different pain.   Patient also has history of dysphagia. She has prior history of esophageal stricture and has had dilation. She has notice worsening dysphagia for last 4 months. Chest pain this morning was not consistent with her chronic dysphagia.    Review of Systems:  Negative, except as per HPI.   Past Medical History  Diagnosis Date  . CAD (coronary artery disease)     S/p PTCA / stenting (last cath 2004, multiple LAD stents, 2 stents in the right coronary artery all patent)  . 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 Started coumadin  . HTN (hypertension)   . Restless leg syndrome   . PMR (polymyalgia rheumatica)   . Axillary abscess   . Anxiety   . Dementia   . Complication of anesthesia   . PONV (postoperative nausea and vomiting)   . Shortness of breath   . GERD (gastroesophageal reflux disease)   . Cancer    Past Surgical History  Procedure Laterality Date  . Colectomy    . Stent      cardiac x 8 stents.  . Incision and drainage      bilateral axillary, non specific staff  . Incision and drainage abscess  09/28/2012    Procedure: INCISION AND DRAINAGE ABSCESS;  Surgeon: Zenovia Jarred, MD;  Location: [place];  Service: General;  Laterality: Bilateral;  . Esophagogastroduodenoscopy (egd) with  esophageal dilation  10/10/2012    Procedure: ESOPHAGOGASTRODUODENOSCOPY (EGD) WITH ESOPHAGEAL DILATION;  Surgeon: Inda Castle, MD;  Location: [place];  Service: Endoscopy;  Laterality: N/A;  . Breast lumpectomy    . Heart stents  x 8   Social History:  reports that she has never smoked. She has never used smokeless tobacco. She reports that she does not drink alcohol or use illicit drugs.  Allergies  Allergen Reactions  . Codeine Phosphate Nausea And Vomiting  . Darvocet [Propoxyphene N-Acetaminophen] Nausea And Vomiting  . Promethazine Hcl Nausea And Vomiting  . Sulfamethoxazole Other (See Comments)    Pt doesn't remember reaction  . Vicodin [Hydrocodone-Acetaminophen] Other (See Comments)    Unknown reaction  . Amoxicillin Nausea And Vomiting and Rash  . Penicillins Nausea And Vomiting and Rash    History reviewed. No pertinent family history.   Prior to Admission medications   Medication Sig Start Date End Date Taking? Authorizing Provider  acetaminophen (TYLENOL) 500 MG tablet Take 500 mg by mouth 3 (three) times daily as needed for pain. For pain    Historical Provider, MD  amLODipine (NORVASC) 10 MG tablet Take 10 mg by mouth every morning.     Historical Provider, MD  atorvastatin (LIPITOR) 20 MG tablet Take 10 mg by mouth every evening.     Historical Provider, MD  cephALEXin (KEFLEX) 500 MG capsule Take 1 capsule (  500 mg total) by mouth 4 (four) times daily. 01/20/14   Clayton Bibles, PA-C  cholecalciferol (VITAMIN D) 1000 UNITS tablet Take 2,000 Units by mouth daily.    Historical Provider, MD  cholestyramine light (PREVALITE) 4 G packet Take 4 g by mouth daily as needed (for loose bowels).    Historical Provider, MD  donepezil (ARICEPT) 10 MG tablet Take 10 mg by mouth every morning.    Historical Provider, MD  esomeprazole (NEXIUM) 40 MG capsule Take 40 mg by mouth 2 (two) times daily as needed (for acid reflux).    Historical Provider, MD  ferrous sulfate 325 (65 FE)  MG EC tablet Take 325 mg by mouth daily with breakfast.    Historical Provider, MD  LORazepam (ATIVAN) 0.5 MG tablet Take 0.25 mg by mouth at bedtime.  06/14/12   Ripudeep Krystal Eaton, MD  losartan (COZAAR) 50 MG tablet Take 50 mg by mouth at bedtime.     Historical Provider, MD  mirabegron ER (MYRBETRIQ) 25 MG TB24 Take 25 mg by mouth every evening.     Historical Provider, MD  mupirocin ointment (BACTROBAN) 2 % Apply 1 application topically daily.     Historical Provider, MD  nitroGLYCERIN (NITROSTAT) 0.4 MG SL tablet Place 0.4 mg under the tongue every 5 (five) minutes as needed for chest pain.    Historical Provider, MD  nystatin (MYCOSTATIN/NYSTOP) 100000 UNIT/GM POWD Apply topically 2 (two) times daily.    Historical Provider, MD  Pediatric Multiple Vit-C-FA (CHILDRENS CHEWABLE MULTI VITS PO) Take 1 tablet by mouth daily.    Historical Provider, MD  potassium chloride SA (K-DUR,KLOR-CON) 20 MEQ tablet Take 20 mEq by mouth daily.    Historical Provider, MD  predniSONE (DELTASONE) 1 MG tablet Take 2 mg by mouth every morning.     Historical Provider, MD  rOPINIRole (REQUIP) 1 MG tablet Take 1 mg by mouth 3 (three) times daily. 1mg  at noon, 1mg  2 hours before bed, then 1mg  at bedtime    Historical Provider, MD  traMADol (ULTRAM) 50 MG tablet Take 25 mg by mouth 3 (three) times daily as needed for pain.     Historical Provider, MD  warfarin (COUMADIN) 2.5 MG tablet Take 1.25-2.5 mg by mouth every evening. Takes 1.25mg  on Tuesday, Thursday, and Saturday. Takes 2.5mg  on all other days    Historical Provider, MD  zaleplon (SONATA) 10 MG capsule Take 10 mg by mouth at bedtime.    Historical Provider, MD   Physical Exam: Filed Vitals:   02/28/14 1500  BP: 133/56  Pulse: 60  Temp: 97.8 F (36.6 C)  Resp: 18    BP 133/56  Pulse 60  Temp(Src) 97.8 F (36.6 C) (Oral)  Resp 18  Ht 5\' 2"  (1.575 m)  Wt 67.042 kg (147 lb 12.8 oz)  BMI 27.03 kg/m2  SpO2 100%  General:  Appears calm and  comfortable Eyes: PERRL, normal lids, irises & conjunctiva ENT: grossly normal hearing, lips & tongue Neck: no LAD, masses or thyromegaly Cardiovascular: RRR, no m/r/g. No LE edema. Telemetry: SR, no arrhythmias  Respiratory: CTA bilaterally, no w/r/r. Normal respiratory effort. Abdomen: soft, ntnd Skin: no rash or induration seen on limited exam Musculoskeletal: grossly normal tone BUE/BLE Neurologic: grossly non-focal.          Labs on Admission:  Basic Metabolic Panel:  Recent Labs Lab 02/28/14 1050  NA 140  K 3.5*  CL 103  CO2 22  GLUCOSE 122*  BUN 15  CREATININE 0.99  CALCIUM 9.2   Liver Function Tests:  Recent Labs Lab 02/28/14 1050  AST 16  ALT 11  ALKPHOS 41  BILITOT 0.3  PROT 6.2  ALBUMIN 3.2*   No results found for this basename: LIPASE, AMYLASE,  in the last 168 hours No results found for this basename: AMMONIA,  in the last 168 hours CBC:  Recent Labs Lab 02/28/14 1050  WBC 6.7  NEUTROABS 4.2  HGB 12.8  HCT 40.7  MCV 92.1  PLT 247   Cardiac Enzymes:  Recent Labs Lab 02/28/14 1050  TROPONINI <0.30    BNP (last 3 results)  Recent Labs  01/20/14 0858 02/28/14 1050  PROBNP 112.2 100.5   CBG: No results found for this basename: GLUCAP,  in the last 168 hours  Radiological Exams on Admission: Dg Chest Port 1 View  02/28/2014   CLINICAL DATA:  Chest pain. Shortness of breath. Coronary artery disease.  EXAM: PORTABLE CHEST - 1 VIEW  COMPARISON:  01/20/2014  FINDINGS: Chronic elevation of right hemidiaphragm and right basilar scarring remains stable. No evidence of acute infiltrate or edema. No evidence of pleural effusion. Heart size is stable and within normal limits. Ectasia thoracic aorta is unchanged.  IMPRESSION: Chronic elevation of right hemidiaphragm and right basilar scarring. No acute findings.   Electronically Signed   By: Earle Gell M.D.   On: 02/28/2014 11:26    EKG: Independently reviewed. No significant changes compare  to prior EKG.   Assessment/Plan Principal Problem:   Chest pain Active Problems:   CAD (coronary artery disease)   HTN (hypertension)   Stricture and stenosis of esophagus   1- Chest Pain, Dyspnea; Patient with multiple risk factors. Admit to telemetry, cycle cardiac enzymes. Need to be careful with nitroglycerin doses patient developed hypotension with nitro. She has prior  history of PE. INR therapeutic, less likely PE. Will order ECHO. Fentanyl PRN for pain. BNP not elevated less likely heart failure, chest x ray with no pulmonary edema. First troponin negative.   2-Dysphagia; will order barium esophagogram. Speech evaluation for diet recommendation. Continue with nexium.  3-Hypertension; hold Coozar and lasix due to transient hypotensive event.  4-Polymyalgia Rheumatica; continue with prednisone.  5-History of PE, DVT; continue with coumadin, pharmacy to dose.   Code Status: partial code, no intubation.  Family Communication: care discussed with son.  Disposition Plan: expect less than 2 nights depending on clinical condition.   Time spent: 75 minutes.   [facility] Pager [phone]

## 2014-02-28 NOTE — ED Notes (Signed)
Pt complaining of chest pressure and shoulder pain along with SOB. EMS EKG shows SR with HR of 61. BP 119/70, sats 100% on 2L [place]. Pt complaining of chest pressure 5/10. Pt denies nausea and lightheaded. Pt states earlier today she was nauseated

## 2014-02-28 NOTE — ED Provider Notes (Signed)
CSN: [id number]     Arrival date & time 02/28/14  1007 History   First MD Initiated Contact with Patient 02/28/14 1008     Chief Complaint  Patient presents with  . Shortness of Breath  . Chest Pain     (Consider location/radiation/quality/duration/timing/severity/associated sxs/prior Treatment) Patient is a 80 y.o. female presenting with shortness of breath and chest pain. The history is provided by the patient and medical records.  Shortness of Breath Severity:  Moderate Onset quality:  Gradual Duration:  1 day Timing:  Constant Progression:  Waxing and waning Chronicity:  Recurrent Relieved by:  Nothing Worsened by:  Nothing tried Ineffective treatments:  Rest Associated symptoms: chest pain   Associated symptoms: no abdominal pain, no cough, no diaphoresis, no fever, no vomiting and no wheezing   Chest Pain Associated symptoms: shortness of breath   Associated symptoms: no abdominal pain, no cough, no diaphoresis, no fever and not vomiting     Past Medical History  Diagnosis Date  . CAD (coronary artery disease)     S/p PTCA / stenting (last cath 2004, multiple LAD stents, 2 stents in the right coronary artery all patent)  . 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 Started coumadin  . HTN (hypertension)   . Restless leg syndrome   . PMR (polymyalgia rheumatica)   . Axillary abscess   . Anxiety   . Dementia   . Complication of anesthesia   . PONV (postoperative nausea and vomiting)   . Shortness of breath   . GERD (gastroesophageal reflux disease)   . Cancer    Past Surgical History  Procedure Laterality Date  . Colectomy    . Stent      cardiac x 8 stents.  . Incision and drainage      bilateral axillary, non specific staff  . Incision and drainage abscess  09/28/2012    Procedure: INCISION AND DRAINAGE ABSCESS;  Surgeon: Zenovia Jarred, MD;  Location: [facility];  Service: General;  Laterality: Bilateral;  . Esophagogastroduodenoscopy (egd) with  esophageal dilation  10/10/2012    Procedure: ESOPHAGOGASTRODUODENOSCOPY (EGD) WITH ESOPHAGEAL DILATION;  Surgeon: Inda Castle, MD;  Location: [facility];  Service: Endoscopy;  Laterality: N/A;  . Breast lumpectomy    . Heart stents  x 8   History reviewed. No pertinent family history. History  Substance Use Topics  . Smoking status: Never Smoker   . Smokeless tobacco: Never Used  . Alcohol Use: No   OB History   Grav Para Term Preterm Abortions TAB SAB Ect Mult Living                 Review of Systems  Constitutional: Negative for fever and diaphoresis.  Respiratory: Positive for shortness of breath. Negative for cough and wheezing.   Cardiovascular: Positive for chest pain.  Gastrointestinal: Negative for vomiting and abdominal pain.  All other systems reviewed and are negative.     Allergies  Codeine phosphate; Darvocet; Promethazine hcl; Sulfamethoxazole; Vicodin; Amoxicillin; and Penicillins  Home Medications   Prior to Admission medications   Medication Sig Start Date End Date Taking? Authorizing Provider  acetaminophen (TYLENOL) 500 MG tablet Take 500 mg by mouth 3 (three) times daily as needed for pain. For pain    Historical Provider, MD  amLODipine (NORVASC) 10 MG tablet Take 10 mg by mouth every morning.     Historical Provider, MD  atorvastatin (LIPITOR) 20 MG tablet Take 10 mg by mouth every evening.  Historical Provider, MD  cephALEXin (KEFLEX) 500 MG capsule Take 1 capsule (500 mg total) by mouth 4 (four) times daily. 01/20/14   Clayton Bibles, PA-C  cholecalciferol (VITAMIN D) 1000 UNITS tablet Take 2,000 Units by mouth daily.    Historical Provider, MD  cholestyramine light (PREVALITE) 4 G packet Take 4 g by mouth daily as needed (for loose bowels).    Historical Provider, MD  donepezil (ARICEPT) 10 MG tablet Take 10 mg by mouth every morning.    Historical Provider, MD  esomeprazole (NEXIUM) 40 MG capsule Take 40 mg by mouth 2 (two) times daily as needed  (for acid reflux).    Historical Provider, MD  ferrous sulfate 325 (65 FE) MG EC tablet Take 325 mg by mouth daily with breakfast.    Historical Provider, MD  LORazepam (ATIVAN) 0.5 MG tablet Take 0.25 mg by mouth at bedtime.  06/14/12   Ripudeep Krystal Eaton, MD  losartan (COZAAR) 50 MG tablet Take 50 mg by mouth at bedtime.     Historical Provider, MD  mirabegron ER (MYRBETRIQ) 25 MG TB24 Take 25 mg by mouth every evening.     Historical Provider, MD  mupirocin ointment (BACTROBAN) 2 % Apply 1 application topically daily.     Historical Provider, MD  nitroGLYCERIN (NITROSTAT) 0.4 MG SL tablet Place 0.4 mg under the tongue every 5 (five) minutes as needed for chest pain.    Historical Provider, MD  nystatin (MYCOSTATIN/NYSTOP) 100000 UNIT/GM POWD Apply topically 2 (two) times daily.    Historical Provider, MD  Pediatric Multiple Vit-C-FA (CHILDRENS CHEWABLE MULTI VITS PO) Take 1 tablet by mouth daily.    Historical Provider, MD  potassium chloride SA (K-DUR,KLOR-CON) 20 MEQ tablet Take 20 mEq by mouth daily.    Historical Provider, MD  predniSONE (DELTASONE) 1 MG tablet Take 2 mg by mouth every morning.     Historical Provider, MD  rOPINIRole (REQUIP) 1 MG tablet Take 1 mg by mouth 3 (three) times daily. 1mg  at noon, 1mg  2 hours before bed, then 1mg  at bedtime    Historical Provider, MD  traMADol (ULTRAM) 50 MG tablet Take 25 mg by mouth 3 (three) times daily as needed for pain.     Historical Provider, MD  warfarin (COUMADIN) 2.5 MG tablet Take 1.25-2.5 mg by mouth every evening. Takes 1.25mg  on Tuesday, Thursday, and Saturday. Takes 2.5mg  on all other days    Historical Provider, MD  zaleplon (SONATA) 10 MG capsule Take 10 mg by mouth at bedtime.    Historical Provider, MD   BP 134/49  Pulse 65  Temp(Src) 98.1 F (36.7 C) (Oral)  Resp 16  Ht 5\' 2"  (1.575 m)  Wt 155 lb (70.308 kg)  BMI 28.34 kg/m2  SpO2 99% Physical Exam  Nursing note and vitals reviewed. Constitutional: She is oriented to  person, place, and time. She appears well-developed and well-nourished.  HENT:  Head: Normocephalic and atraumatic.  Eyes: Conjunctivae are normal. No scleral icterus.  Neck: Neck supple.  Cardiovascular: Normal rate, regular rhythm and intact distal pulses.   Pulmonary/Chest: Effort normal. No respiratory distress. She has no wheezes. She has no rales.  Abdominal: Soft.  Musculoskeletal: She exhibits edema. She exhibits no tenderness.  Neurological: She is alert and oriented to person, place, and time.  Skin: Skin is warm.  Psychiatric: She has a normal mood and affect.    ED Course  Procedures (including critical care time) Labs Review Labs Reviewed  COMPREHENSIVE METABOLIC PANEL - Abnormal; Notable  for the following:    Potassium 3.5 (*)    Glucose, Bld 122 (*)    Albumin 3.2 (*)    GFR calc non Af Amer 49 (*)    GFR calc Af Amer 56 (*)    All other components within normal limits  CBC WITH DIFFERENTIAL  TROPONIN I  PRO B NATRIURETIC PEPTIDE    Imaging Review Dg Chest Port 1 View  02/28/2014   CLINICAL DATA:  Chest pain. Shortness of breath. Coronary artery disease.  EXAM: PORTABLE CHEST - 1 VIEW  COMPARISON:  01/20/2014  FINDINGS: Chronic elevation of right hemidiaphragm and right basilar scarring remains stable. No evidence of acute infiltrate or edema. No evidence of pleural effusion. Heart size is stable and within normal limits. Ectasia thoracic aorta is unchanged.  IMPRESSION: Chronic elevation of right hemidiaphragm and right basilar scarring. No acute findings.   Electronically Signed   By: Earle Gell M.D.   On: 02/28/2014 11:26     EKG Interpretation   Date/Time:  Thursday February 28 2014 10:18:38 EDT Ventricular Rate:  61 PR Interval:  154 QRS Duration: 87 QT Interval:  405 QTC Calculation: 408 R Axis:   22 Text Interpretation:  Sinus rhythm Low voltage, precordial leads Minimal  ST elevation, inferior leads more likely artifact Poor data quality  Borderline  ECG No significant change since last tracing Confirmed by [facility]   MD, MICHEAL ([id number]) on 02/28/2014 10:22:37 AM       RA sat is 100% and I interpret to be normal   Pt's pain improved with SL NTG, but not resolved.  More NTG given.  Transient hypotension earlier may have been from NTG.  Troponin neg.  No new ischemia.  Discsused with [facility] to admit for obs status rule out.    MDM   Final diagnoses:  Chest pain    Pt felt somewhat improved, CP is better after NTG in the ED.  Pt has a h/o CAD, although has not had to see cardiology in quite some time.  Still SOB.  ECG shows no discrete changes, likely more artifact.  Initial troponin is neg.  Will consult with [facility] to see pt in consultation for obs admission.     Saddie Benders. Genaro Bekker, MD 02/28/14 1343

## 2014-03-01 ENCOUNTER — Observation Stay (HOSPITAL_COMMUNITY): Payer: Medicare Other

## 2014-03-01 ENCOUNTER — Encounter: Payer: Self-pay | Admitting: Internal Medicine

## 2014-03-01 DIAGNOSIS — R1310 Dysphagia, unspecified: Secondary | ICD-10-CM

## 2014-03-01 DIAGNOSIS — R079 Chest pain, unspecified: Secondary | ICD-10-CM

## 2014-03-01 DIAGNOSIS — I517 Cardiomegaly: Secondary | ICD-10-CM

## 2014-03-01 LAB — PROTIME-INR
INR: 2.02 — AB (ref 0.00–1.49)
PROTHROMBIN TIME: 22.2 s — AB (ref 11.6–15.2)

## 2014-03-01 LAB — BASIC METABOLIC PANEL
BUN: 14 mg/dL (ref 6–23)
CALCIUM: 8.9 mg/dL (ref 8.4–10.5)
CO2: 24 mEq/L (ref 19–32)
CREATININE: 1 mg/dL (ref 0.50–1.10)
Chloride: 105 mEq/L (ref 96–112)
GFR calc non Af Amer: 48 mL/min — ABNORMAL LOW (ref >90)
GFR, EST AFRICAN AMERICAN: 56 mL/min — AB (ref >90)
Glucose, Bld: 98 mg/dL (ref 70–99)
Potassium: 4.2 mEq/L (ref 3.7–5.3)
Sodium: 142 mEq/L (ref 137–147)

## 2014-03-01 LAB — MRSA PCR SCREENING: MRSA BY PCR: NEGATIVE

## 2014-03-01 LAB — TROPONIN I: Troponin I: <0.3 ng/mL (ref <0.30)

## 2014-03-01 MED ORDER — SODIUM CHLORIDE 0.9 % IJ SOLN
80.0000 mg | INTRAVENOUS | Status: AC
  Administered 2014-03-01: 80 mg via INTRAVENOUS

## 2014-03-01 MED ORDER — WARFARIN SODIUM 2.5 MG PO TABS
2.5000 mg | ORAL_TABLET | Freq: Once | ORAL | Status: AC
  Administered 2014-03-01: 2.5 mg via ORAL
  Filled 2014-03-01: qty 1

## 2014-03-01 MED ORDER — TECHNETIUM TC 99M TETROFOSMIN IV KIT
30.0000 | PACK | Freq: Once | INTRAVENOUS | Status: AC | PRN
  Administered 2014-03-01: 30 via INTRAVENOUS

## 2014-03-01 MED ORDER — ASPIRIN EC 81 MG PO TBEC: 81.0000 mg | DELAYED_RELEASE_TABLET | Freq: Every day | ORAL | Status: DC

## 2014-03-01 MED ORDER — TECHNETIUM TC 99M SESTAMIBI GENERIC - CARDIOLITE
10.0000 | Freq: Once | INTRAVENOUS | Status: AC | PRN
  Administered 2014-03-01: 10 via INTRAVENOUS

## 2014-03-01 MED ORDER — DOXEPIN HCL 6 MG PO TABS: 6.0000 mg | ORAL_TABLET | Freq: Every day | ORAL | Status: DC

## 2014-03-01 MED ORDER — FUROSEMIDE 40 MG PO TABS
40.0000 mg | ORAL_TABLET | Freq: Every day | ORAL | Status: DC | PRN
  Filled 2014-03-01: qty 1

## 2014-03-01 MED ORDER — REGADENOSON 0.4 MG/5ML IV SOLN
INTRAVENOUS | Status: AC
  Administered 2014-03-01: 0.4 mg
  Filled 2014-03-01: qty 5

## 2014-03-01 NOTE — Discharge Summary (Signed)
Physician Discharge Summary  [name] [name] SWF:[id number] DOB: October 23, 1921 DOA: 02/28/2014  PCP: Horatio Pel, MD  Admit date: 02/28/2014 Discharge date: 03/01/2014  Time spent: 35 minutes  Recommendations for Outpatient Follow-up:  1. Needs to follow up with GI for further evaluation of dysphagia.   Discharge Diagnoses:    Chest pain   Dysphagia, dysmotility disorder.    CAD (coronary artery disease)   HTN (hypertension)   History of Stricture and stenosis of esophagus   Discharge Condition: Stable.   Diet recommendation: Heart Healthy  Filed Weights   02/28/14 1018 02/28/14 1500  Weight: 70.308 kg (155 lb) 67.042 kg (147 lb 12.8 oz)    History of present illness:  [name] [name] is a 80 y.o. female with PMH significant for CAD S/P stent last Cath 2004, History of PE, DVT, hypertension who presents complaining of SOB and chest pain. Patient relates dyspnea on exertion for last week. She is not even able to walk more than 25 feet without getting SOB. She started to have chest pain this morning, pressure like, 8/10, accompanied by dyspnea, and tremors. She also relates pain in her shoulder blade but that is a different pain.  Patient also has history of dysphagia. She has prior history of esophageal stricture and has had dilation. She has notice worsening dysphagia for last 4 months. Chest pain this morning was not consistent with her chronic dysphagia.    Hospital Course:  1- Chest Pain, Dyspnea; Patient with multiple risk factors. Admit to telemetry, troponin times 3 negative. Need to be careful with nitroglycerin doses patient developed hypotension with nitro. She has prior history of PE. INR therapeutic, less likely PE.  -ECHO pending.  -BNP not elevated less likely heart failure, chest x ray with no pulmonary edema.  -Myoview; no perfusion defect.  -Patient chest pain free this am. Denies Dyspnea. Pain could be from esophageal spasm. Continue with protonix.    2-Dysphagia;  barium esophagogram consistent with dysmotility disorder. Mild esophageal mucosal thickening, suggestive of esophagitis.Continue with nexium. Speech therapy recommend regular diet. Patient was able to tolerates diner. I have arrange follow up with [facility].   3-Hypertension; resume lasix, norvasc. BP has been stable. No further hypotension.   4-Polymyalgia Rheumatica; continue with prednisone.   5-History of PE, DVT; continue with coumadin, pharmacy to dose.    Procedures:  Myoview: no perfusion defect.   ECHO; pending.   Consultations:  Cardiology  Discharge Exam: Filed Vitals:   03/01/14 1439  BP: 144/60  Pulse: 70  Temp: 98 F (36.7 C)  Resp: 18    General: No distress.  Cardiovascular: S 1, S 2 RRR Respiratory: CTA  Discharge Instructions You were cared for by a hospitalist during your hospital stay. If you have any questions about your discharge medications or the care you received while you were in the hospital after you are discharged, you can call the unit and asked to speak with the hospitalist on call if the hospitalist that took care of you is not available. Once you are discharged, your primary care physician will handle any further medical issues. Please note that NO REFILLS for any discharge medications will be authorized once you are discharged, as it is imperative that you return to your primary care physician (or establish a relationship with a primary care physician if you do not have one) for your aftercare needs so that they can reassess your need for medications and monitor your lab values.   Future Appointments Provider Department  Dept Phone   03/07/2014 10:30 AM Willia Craze, NP [facility] [phone]       Medication List    ASK your doctor about these medications       acetaminophen 500 MG tablet  Commonly known as:  TYLENOL  Take 500 mg by mouth 3 (three) times daily as needed for pain. For pain      amLODipine 10 MG tablet  Commonly known as:  NORVASC  Take 10 mg by mouth every morning.     atorvastatin 20 MG tablet  Commonly known as:  LIPITOR  Take 10 mg by mouth every evening.     CHILDRENS CHEWABLE MULTI VITS PO  Take 1 tablet by mouth daily with lunch.     cholecalciferol 1000 UNITS tablet  Commonly known as:  VITAMIN D  Take 2,000 Units by mouth daily with lunch.     cholestyramine light 4 G packet  Commonly known as:  PREVALITE  Take 4 g by mouth daily as needed (for loose bowels).     donepezil 10 MG tablet  Commonly known as:  ARICEPT  Take 10 mg by mouth every morning.     Doxepin HCl 6 MG Tabs  Take 6 mg by mouth at bedtime.     esomeprazole 40 MG capsule  Commonly known as:  NEXIUM  Take 40 mg by mouth 2 (two) times daily as needed (for acid reflux).     ferrous sulfate 325 (65 FE) MG EC tablet  Take 325 mg by mouth daily with breakfast.     furosemide 40 MG tablet  Commonly known as:  LASIX  Take 40 mg by mouth daily as needed for fluid (Leg swelling).     LORazepam 0.5 MG tablet  Commonly known as:  ATIVAN  Take 0.25 mg by mouth at bedtime.     losartan 100 MG tablet  Commonly known as:  COZAAR  Take 100 mg by mouth at bedtime.     mupirocin ointment 2 %  Commonly known as:  BACTROBAN  Apply 1 application topically daily with lunch.     MYRBETRIQ 25 MG Tb24 tablet  Generic drug:  mirabegron ER  Take 25 mg by mouth every evening.     nitroGLYCERIN 0.4 MG SL tablet  Commonly known as:  NITROSTAT  Place 0.4 mg under the tongue every 5 (five) minutes as needed for chest pain.     nystatin 100000 UNIT/GM Powd  Apply 1 g topically daily.     potassium chloride SA 20 MEQ tablet  Commonly known as:  K-DUR,KLOR-CON  Take 20 mEq by mouth daily with lunch.     predniSONE 1 MG tablet  Commonly known as:  DELTASONE  Take 2 mg by mouth every morning.     rOPINIRole 1 MG tablet  Commonly known as:  REQUIP  Take 1 mg by mouth 3 (three)  times daily. 1mg  at noon, 1mg  2 hours before bed, then 1mg  at bedtime     traMADol 50 MG tablet  Commonly known as:  ULTRAM  Take 25 mg by mouth 3 (three) times daily as needed for pain.     warfarin 2.5 MG tablet  Commonly known as:  COUMADIN  Take 1.25-2.5 mg by mouth every evening. Takes 1.25mg  Saturday. Takes 2.5mg  on all other days       Allergies  Allergen Reactions  . Codeine Phosphate Nausea And Vomiting  . Darvocet [Propoxyphene N-Acetaminophen] Nausea And Vomiting  . Promethazine Hcl  Nausea And Vomiting  . Sulfamethoxazole Other (See Comments)    Pt doesn't remember reaction  . Vicodin [Hydrocodone-Acetaminophen] Other (See Comments)    Unknown reaction  . Amoxicillin Nausea And Vomiting and Rash  . Penicillins Nausea And Vomiting and Rash       Follow-up Information   Follow up with Tye Savoy, NP. (May 7 at 10;30 Am. )    Specialty:  Nurse Practitioner   Contact information:   [address] [place] [address] [phone]        The results of significant diagnostics from this hospitalization (including imaging, microbiology, ancillary and laboratory) are listed below for reference.    Significant Diagnostic Studies: Dg Esophagus  03/01/2014   CLINICAL DATA:  Dysphagia for the past 6 months.  EXAM: ESOPHOGRAM/BARIUM SWALLOW  TECHNIQUE: Single contrast examination was performed using thin barium or water soluble.  FLUOROSCOPY TIME:  1 min and 17 seconds  COMPARISON:  No priors.  FINDINGS: Examination was limited by lack of patient mobility. Accordingly, a single contrast examination was performed with the patient in a supine position. This limited examination demonstrated no esophageal mass, stricture or esophageal ring. No hiatal hernia. Esophageal mucosa. Mildly thickened. Additionally, esophageal motility was grossly abnormal with failure to transmit all of the primary peristaltic waves. Severe tertiary contractions were noted throughout the  examination.  IMPRESSION: 1. Findings, as above, most compatible with esophageal motility disorder, with severe tertiary contractions. 2. Mild esophageal mucosal thickening, suggestive of esophagitis.   Electronically Signed   By: Vinnie Langton M.D.   On: 03/01/2014 10:15   Dg Chest Port 1 View  02/28/2014   CLINICAL DATA:  Chest pain. Shortness of breath. Coronary artery disease.  EXAM: PORTABLE CHEST - 1 VIEW  COMPARISON:  01/20/2014  FINDINGS: Chronic elevation of right hemidiaphragm and right basilar scarring remains stable. No evidence of acute infiltrate or edema. No evidence of pleural effusion. Heart size is stable and within normal limits. Ectasia thoracic aorta is unchanged.  IMPRESSION: Chronic elevation of right hemidiaphragm and right basilar scarring. No acute findings.   Electronically Signed   By: Earle Gell M.D.   On: 02/28/2014 11:26    Microbiology: Recent Results (from the past 240 hour(s))  MRSA PCR SCREENING     Status: None   Collection Time    03/01/14  1:03 AM      Result Value Ref Range Status   MRSA by PCR NEGATIVE  NEGATIVE Final   Comment:            The GeneXpert MRSA Assay (FDA     approved for NASAL specimens     only), is one component of a     comprehensive MRSA colonization     surveillance program. It is not     intended to diagnose MRSA     infection nor to guide or     monitor treatment for     MRSA infections.     Labs: Basic Metabolic Panel:  Recent Labs Lab 02/28/14 1050 03/01/14 1010  NA 140 142  K 3.5* 4.2  CL 103 105  CO2 22 24  GLUCOSE 122* 98  BUN 15 14  CREATININE 0.99 1.00  CALCIUM 9.2 8.9   Liver Function Tests:  Recent Labs Lab 02/28/14 1050  AST 16  ALT 11  ALKPHOS 41  BILITOT 0.3  PROT 6.2  ALBUMIN 3.2*   No results found for this basename: LIPASE, AMYLASE,  in the last 168 hours  No results found for this basename: AMMONIA,  in the last 168 hours CBC:  Recent Labs Lab 02/28/14 1050  WBC 6.7  NEUTROABS  4.2  HGB 12.8  HCT 40.7  MCV 92.1  PLT 247   Cardiac Enzymes:  Recent Labs Lab 02/28/14 1050 02/28/14 1545 02/28/14 2044 03/01/14 0445  TROPONINI <0.30 <0.30 <0.30 <0.30   BNP: BNP (last 3 results)  Recent Labs  01/20/14 0858 02/28/14 1050  PROBNP 112.2 100.5   CBG: No results found for this basename: GLUCAP,  in the last 168 hours     Signed:  Belkys A Regalado  [facility] 03/01/2014, 3:14 PM

## 2014-03-01 NOTE — Progress Notes (Signed)
  Echocardiogram 2D Echocardiogram has been performed.  [name] [name] 03/01/2014, 3:38 PM

## 2014-03-01 NOTE — Progress Notes (Signed)
Pt discharged to home per MD order. Pt and son received and reviewed all discharge instructions and medication information including follow-up appointments and prescriptions. Pt and son verbalized understanding. Pt IV and telemetry box removed prior to discharge. Pt alert and oriented at discharge with no complaints of pain. Pt escorted to private vehicle via wheelchair by nurse tech. Lenna Sciara

## 2014-03-01 NOTE — Progress Notes (Signed)
ANTICOAGULATION CONSULT NOTE - Initial Consult  Pharmacy Consult for Coumadin Indication: hx DVT and PE (2011)  Allergies  Allergen Reactions  . Codeine Phosphate Nausea And Vomiting  . Darvocet [Propoxyphene N-Acetaminophen] Nausea And Vomiting  . Promethazine Hcl Nausea And Vomiting  . Sulfamethoxazole Other (See Comments)    Pt doesn't remember reaction  . Vicodin [Hydrocodone-Acetaminophen] Other (See Comments)    Unknown reaction  . Amoxicillin Nausea And Vomiting and Rash  . Penicillins Nausea And Vomiting and Rash    Patient Measurements: Height: 5\' 2"  (157.5 cm) Weight: 147 lb 12.8 oz (67.042 kg) IBW/kg (Calculated) : 50.1  Vital Signs: Temp: 98 F (36.7 C) (05/01 0700) Temp src: Oral (05/01 0700) BP: 139/58 mmHg (05/01 0700) Pulse Rate: 61 (05/01 0700)  Labs:  Recent Labs  02/28/14 1050 02/28/14 1545 02/28/14 2044 03/01/14 0445 03/01/14 0500  HGB 12.8  --   --   --   --   HCT 40.7  --   --   --   --   PLT 247  --   --   --   --   LABPROT  --  22.2*  --   --  22.2*  INR  --  2.02*  --   --  2.02*  CREATININE 0.99  --   --   --   --   TROPONINI <0.30 <0.30 <0.30 <0.30  --     Estimated Creatinine Clearance: 33.9 ml/min (by C-G formula based on Cr of 0.99).   Medical History: Past Medical History  Diagnosis Date  . CAD (coronary artery disease)     S/p PTCA / stenting (last cath 2004, multiple LAD stents, 2 stents in the right coronary artery all patent)  . 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 Started coumadin  . HTN (hypertension)   . Restless leg syndrome   . PMR (polymyalgia rheumatica)   . Axillary abscess   . Anxiety   . Dementia   . Complication of anesthesia   . PONV (postoperative nausea and vomiting)   . Shortness of breath   . GERD (gastroesophageal reflux disease)   . Cancer    Assessment:   80 yr old woman admitted with chest pain and dyspnea.  Has been on Coumadin for hx DVT and PE in 2011.   INR therapeutic  again. May need to increase dose slightly  Goal of Therapy:  INR 2-3 Monitor platelets by anticoagulation protocol: Yes   Plan:    Coumadin 2.5 mg tonight per usual home dose.  Daily PT/INR for now.

## 2014-03-01 NOTE — Progress Notes (Signed)
Lexiscan myoview completed without complications. + chest pain and SOB but resolved with recovery and aminophylline.  Nuc results to follow.

## 2014-03-01 NOTE — Progress Notes (Signed)
UR Completed.  [name] [name] [name] [phone] 03/01/2014  

## 2014-03-07 ENCOUNTER — Encounter: Payer: Self-pay | Admitting: Nurse Practitioner

## 2014-03-07 ENCOUNTER — Ambulatory Visit (INDEPENDENT_AMBULATORY_CARE_PROVIDER_SITE_OTHER): Payer: Medicare Other | Admitting: Nurse Practitioner

## 2014-03-07 VITALS — BP 104/64 | HR 66 | Ht 62.0 in | Wt 148.4 lb

## 2014-03-07 DIAGNOSIS — R1310 Dysphagia, unspecified: Secondary | ICD-10-CM

## 2014-03-07 NOTE — Patient Instructions (Addendum)
Call if symptoms get worse, can call and ask for [name] Payor, RN, [name] [name]'s nurse and we will arrange EGD with Dilation   We discussed the importance of eating slowly, taking small bites of food, chewing well and consuming adequate amounts of fluid in between bites to avoid food impaction. _____________________________________________________  Dysphagia Swallowing problems (dysphagia) occur when solids and liquids seem to stick in your throat on the way down to your stomach, or the food takes longer to get to the stomach. Other symptoms include regurgitating food, noises coming from the throat, chest discomfort with swallowing, and a feeling of fullness or the feeling of something being stuck in your throat when swallowing. When blockage in your throat is complete it may be associated with drooling. CAUSES  Problems with swallowing may occur because of problems with the muscles. The food cannot be propelled in the usual manner into your stomach. You may have ulcers, scar tissue, or inflammation in the tube down which food travels from your mouth to your stomach (esophagus), which blocks food from passing normally into the stomach. Causes of inflammation include:  Acid reflux from your stomach into your esophagus.  Infection.  Radiation treatment for cancer.  Medicines taken without enough fluids to wash them down into your stomach. You may have nerve problems that prevent signals from being sent to the muscles of your esophagus to contract and move your food down to your stomach. Globus pharyngeus is a relatively common problem in which there is a sense of an obstruction or difficulty in swallowing, without any physical abnormalities of the swallowing passages being found. This problem usually improves over time with reassurance and testing to rule out other causes. DIAGNOSIS Dysphagia can be diagnosed and its cause can be determined by tests in which you swallow a white substance that  helps illuminate the inside of your throat (contrast medium) while X-rays are taken. Sometimes a flexible telescope that is inserted down your throat (endoscopy) to look at your esophagus and stomach is used. TREATMENT   If the dysphagia is caused by acid reflux or infection, medicines may be used.  If the dysphagia is caused by problems with your swallowing muscles, swallowing therapy may be used to help you strengthen your swallowing muscles.  If the dysphagia is caused by a blockage or mass, procedures to remove the blockage may be done. HOME CARE INSTRUCTIONS  Try to eat soft food that is easier to swallow and check your weight on a daily basis to be sure that it is not decreasing.  Be sure to drink liquids when sitting upright (not lying down). SEEK MEDICAL CARE IF:  You are losing weight because you are unable to swallow.  You are coughing when you drink liquids (aspiration).  You are coughing up partially digested food. SEEK IMMEDIATE MEDICAL CARE IF:  You are unable to swallow your own saliva .  You are having shortness of breath or a fever, or both.  You have a hoarse voice along with difficulty swallowing. MAKE SURE YOU:  Understand these instructions.  Will watch your condition.  Will get help right away if you are not doing well or get worse. Document Released: 10/15/2000 Document Revised: 06/20/2013 Document Reviewed: 04/06/2013 [facility] Patient Information 2014 [place].

## 2014-03-08 ENCOUNTER — Encounter: Payer: Self-pay | Admitting: Nurse Practitioner

## 2014-03-08 NOTE — Progress Notes (Signed)
     History of Present Illness:    Patient is a 80 year old female known to Dr. Deatra Ina with multiple medical problems, on multiple medications including warfarin.  Patient was hospitalized late last month for shortness of breath and chest pain. Troponins negative. No perfusion defects onl Myoview. Ejection fraction on echocardiogram 60-65%. She complained of dysphagia at the time.  We did not see the patient during her recent hospital admission but an esophagram revealed esophageal motility disorder, with severe tertiary contractions and mild esophageal mucosal thickening, suggestive of esophagitis. Speech therapy evaluated, recommended a regular diet She was asked to followup with [name] [name] outpatient.  Patient presents with her son today.   We saw patient in 2013 for dysphagia. EGD revealed a possible early esophageal stricture which was balloon dilated. Over the last several months of solid food dysphagia seems to have returned but just on an intermittent basis. Son believes patient does not take adequate time to eat/chew. Patient feels that overall her swallowing problems are not too problematic.  Current Medications, Allergies, Past Medical History, Past Surgical History, Family History and Social History were reviewed in [employer] record.  Studies:   Dg Esophagus  03/01/2014   CLINICAL DATA:  Dysphagia for the past 6 months.  EXAM: ESOPHOGRAM/BARIUM SWALLOW  TECHNIQUE: Single contrast examination was performed using thin barium or water soluble.  FLUOROSCOPY TIME:  1 min and 17 seconds  COMPARISON:  No priors.  FINDINGS: Examination was limited by lack of patient mobility. Accordingly, a single contrast examination was performed with the patient in a supine position. This limited examination demonstrated no esophageal mass, stricture or esophageal ring. No hiatal hernia. Esophageal mucosa. Mildly thickened. Additionally, esophageal motility was grossly abnormal with failure to  transmit all of the primary peristaltic waves. Severe tertiary contractions were noted throughout the examination.  IMPRESSION: 1. Findings, as above, most compatible with esophageal motility disorder, with severe tertiary contractions. 2. Mild esophageal mucosal thickening, suggestive of esophagitis.   Electronically Signed   By: Vinnie Langton M.D.   On: 03/01/2014 10:15   Physical Exam: General: Pleasant, well developed , white female in no acute distress Head: Normocephalic and atraumatic Eyes:  sclerae anicteric, conjunctiva pink  Ears: Normal auditory acuity Lungs: Clear throughout to auscultation Heart: Regular rate and rhythm Abdomen: Did not get on exam table so exam suboptimal but abdomen soft, non distended, non-tender. Normal bowel sounds Musculoskeletal: Symmetrical with no gross deformities  Extremities: No edema  Neurological: Alert oriented x 4, grossly nonfocal Psychological:  Alert and cooperative. Normal mood and affect  Assessment and Recommendations:   80 year old female with several month history of  intermittent solid food dysphagia (recurrent problem). Recent esophogram revealed dysmotility and some mucosal thickening suggestive of esophagitis. She had a mild questionable mild esophageal stricture which was dilated in 2013. Patient and son feel dysphagia is manageable for now as long as patient follows precautionary measures. We discussed the importance of eating slowly, taking small bites of food, chewing well and consuming adequate amounts of fluid in between bites to avoid food impaction. Continue daily PPI. Advised patient and son to call if dysphagia worsens at which time we will need to get clearance to hold coumadin and proceed with EGD.

## 2014-03-08 NOTE — Progress Notes (Signed)
Reviewed and agree with management. Cinda Hara D. Konstantin Lehnen, M.D., FACG  

## 2014-11-14 ENCOUNTER — Encounter (HOSPITAL_COMMUNITY): Payer: Self-pay | Admitting: General Surgery

## 2015-07-20 ENCOUNTER — Emergency Department (HOSPITAL_COMMUNITY): Payer: Medicare Other

## 2015-07-20 ENCOUNTER — Observation Stay (HOSPITAL_COMMUNITY)
Admission: EM | Admit: 2015-07-20 | Discharge: 2015-07-22 | Disposition: A | Discharge status: Skilled Nursing Facility | Payer: Medicare Other | Attending: Internal Medicine | Admitting: Internal Medicine

## 2015-07-20 ENCOUNTER — Encounter (HOSPITAL_COMMUNITY): Payer: Self-pay | Admitting: *Deleted

## 2015-07-20 DIAGNOSIS — E785 Hyperlipidemia, unspecified: Secondary | ICD-10-CM | POA: Insufficient documentation

## 2015-07-20 DIAGNOSIS — R262 Difficulty in walking, not elsewhere classified: Secondary | ICD-10-CM

## 2015-07-20 DIAGNOSIS — Z7982 Long term (current) use of aspirin: Secondary | ICD-10-CM | POA: Diagnosis not present

## 2015-07-20 DIAGNOSIS — I82509 Chronic embolism and thrombosis of unspecified deep veins of unspecified lower extremity: Secondary | ICD-10-CM | POA: Diagnosis not present

## 2015-07-20 DIAGNOSIS — N390 Urinary tract infection, site not specified: Secondary | ICD-10-CM | POA: Insufficient documentation

## 2015-07-20 DIAGNOSIS — M353 Polymyalgia rheumatica: Secondary | ICD-10-CM | POA: Diagnosis not present

## 2015-07-20 DIAGNOSIS — I2782 Chronic pulmonary embolism: Secondary | ICD-10-CM | POA: Diagnosis not present

## 2015-07-20 DIAGNOSIS — M549 Dorsalgia, unspecified: Secondary | ICD-10-CM | POA: Diagnosis not present

## 2015-07-20 DIAGNOSIS — N8320 Unspecified ovarian cysts: Secondary | ICD-10-CM | POA: Insufficient documentation

## 2015-07-20 DIAGNOSIS — I10 Essential (primary) hypertension: Secondary | ICD-10-CM | POA: Insufficient documentation

## 2015-07-20 DIAGNOSIS — I8290 Acute embolism and thrombosis of unspecified vein: Secondary | ICD-10-CM | POA: Diagnosis present

## 2015-07-20 DIAGNOSIS — F419 Anxiety disorder, unspecified: Secondary | ICD-10-CM | POA: Insufficient documentation

## 2015-07-20 DIAGNOSIS — M5136 Other intervertebral disc degeneration, lumbar region: Secondary | ICD-10-CM | POA: Diagnosis not present

## 2015-07-20 DIAGNOSIS — F0390 Unspecified dementia without behavioral disturbance: Secondary | ICD-10-CM | POA: Diagnosis present

## 2015-07-20 DIAGNOSIS — I2510 Atherosclerotic heart disease of native coronary artery without angina pectoris: Secondary | ICD-10-CM | POA: Diagnosis not present

## 2015-07-20 DIAGNOSIS — Z7901 Long term (current) use of anticoagulants: Secondary | ICD-10-CM | POA: Insufficient documentation

## 2015-07-20 LAB — BASIC METABOLIC PANEL
ANION GAP: 7 (ref 5–15)
BUN: 12 mg/dL (ref 6–20)
CALCIUM: 9.1 mg/dL (ref 8.9–10.3)
CHLORIDE: 108 mmol/L (ref 101–111)
CO2: 22 mmol/L (ref 22–32)
Creatinine, Ser: 0.87 mg/dL (ref 0.44–1.00)
GFR calc non Af Amer: 57 mL/min — ABNORMAL LOW (ref >60)
Glucose, Bld: 107 mg/dL — ABNORMAL HIGH (ref 65–99)
Potassium: 4.2 mmol/L (ref 3.5–5.1)
Sodium: 137 mmol/L (ref 135–145)

## 2015-07-20 LAB — CBC WITH DIFFERENTIAL/PLATELET
BASOS ABS: 0 10*3/uL (ref 0.0–0.1)
BASOS PCT: 0 %
Eosinophils Absolute: 0.1 10*3/uL (ref 0.0–0.7)
Eosinophils Relative: 1 %
HEMATOCRIT: 38.7 % (ref 36.0–46.0)
HEMOGLOBIN: 12.2 g/dL (ref 12.0–15.0)
Lymphocytes Relative: 15 %
Lymphs Abs: 1.3 10*3/uL (ref 0.7–4.0)
MCH: 28.4 pg (ref 26.0–34.0)
MCHC: 31.5 g/dL (ref 30.0–36.0)
MCV: 90 fL (ref 78.0–100.0)
Monocytes Absolute: 0.4 10*3/uL (ref 0.1–1.0)
Monocytes Relative: 5 %
NEUTROS ABS: 6.4 10*3/uL (ref 1.7–7.7)
NEUTROS PCT: 79 %
Platelets: 274 10*3/uL (ref 150–400)
RBC: 4.3 MIL/uL (ref 3.87–5.11)
RDW: 14.5 % (ref 11.5–15.5)
WBC: 8.2 10*3/uL (ref 4.0–10.5)

## 2015-07-20 LAB — PROTIME-INR
INR: 3.08 — AB (ref 0.00–1.49)
Prothrombin Time: 31.3 seconds — ABNORMAL HIGH (ref 11.6–15.2)

## 2015-07-20 MED ORDER — KETOROLAC TROMETHAMINE 60 MG/2ML IM SOLN
30.0000 mg | Freq: Once | INTRAMUSCULAR | Status: DC
Start: 2015-07-20 — End: 2015-07-21
  Filled 2015-07-20: qty 2

## 2015-07-20 MED ORDER — CYCLOBENZAPRINE HCL 10 MG PO TABS: 5.0000 mg | ORAL_TABLET | Freq: Three times a day (TID) | ORAL | Status: DC | PRN

## 2015-07-20 MED ORDER — TRAMADOL HCL 50 MG PO TABS
100.0000 mg | ORAL_TABLET | Freq: Once | ORAL | Status: AC
  Administered 2015-07-20: 100 mg via ORAL
  Filled 2015-07-20: qty 2

## 2015-07-20 MED ORDER — TRAMADOL HCL 50 MG PO TABS
50.0000 mg | ORAL_TABLET | Freq: Four times a day (QID) | ORAL | Status: DC | PRN
  Administered 2015-07-21: 50 mg via ORAL
  Filled 2015-07-20: qty 1

## 2015-07-20 MED ORDER — ONDANSETRON HCL 4 MG/2ML IJ SOLN: 4.0000 mg | Freq: Four times a day (QID) | INTRAMUSCULAR | Status: DC | PRN

## 2015-07-20 MED ORDER — MORPHINE SULFATE (PF) 2 MG/ML IV SOLN
2.0000 mg | Freq: Once | INTRAVENOUS | Status: DC
  Filled 2015-07-20: qty 1

## 2015-07-20 MED ORDER — ACETAMINOPHEN 500 MG PO TABS
1000.0000 mg | ORAL_TABLET | Freq: Once | ORAL | Status: AC
  Administered 2015-07-20: 1000 mg via ORAL
  Filled 2015-07-20: qty 2

## 2015-07-20 MED ORDER — ACETAMINOPHEN 325 MG PO TABS: 650.0000 mg | ORAL_TABLET | Freq: Four times a day (QID) | ORAL | Status: DC | PRN

## 2015-07-20 MED ORDER — ONDANSETRON HCL 4 MG/2ML IJ SOLN
4.0000 mg | Freq: Once | INTRAMUSCULAR | Status: DC
  Filled 2015-07-20: qty 2

## 2015-07-20 NOTE — ED Notes (Signed)
Pt was placed on bedpan.

## 2015-07-20 NOTE — Progress Notes (Signed)
Attempted report x 1. Call [phone].

## 2015-07-20 NOTE — ED Notes (Signed)
Assisted MD attempt to get pt up and out of bed.  Pt did not tolerate well.

## 2015-07-20 NOTE — ED Notes (Signed)
Pt back from MRI 

## 2015-07-20 NOTE — ED Notes (Signed)
Pt off unit with MRI, son went with her.

## 2015-07-20 NOTE — ED Notes (Signed)
Pt states she turned over in bed last night and felt a pull/pop in her lower middle back.  Pt states she woke up this AM with pain and called EMS.  Pt denies chest, abd pain, N/V/D.  EMS vitals: 158/70,68,18,98%, CBG 112.  Pt normally ambulates with walker but states it was too painful to standup this AM

## 2015-07-20 NOTE — ED Notes (Signed)
Pt at MRI

## 2015-07-20 NOTE — H&P (Signed)
[facility] History and Physical  [name] [name] OAC:[id number] DOB: 1921/10/19 DOA: 07/20/2015  Referring physician: EDP PCP: Horatio Pel, MD   Chief Complaint: Back pain, cant move  HPI: [name] [name] is a 81 y.o. 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 Patient reports her usual state of health until this morning. She woke up early this morning and had sudden onset of low back pain which moved to her right side subsequently couldn't move or sit up. She lives with her son whom she called, he was unable to get her out of bed as well. Subsequently EMS was called and they presented to the emergency room. Patient denies any falls, or trauma, she denies any fevers or chills. She has intermittent back pain and takes Tylenol or low-dose tramadol for this, she ambulates using a walker. She did not do well in the emergency room tonight when ambulation was attempted, MRI LS spine, showed remote T12 and L3 fractures and degenerative canal stenosis but no acute findings. Per EDP, according to the son patient does not tolerate narcotics well.  Review of Systems: positives bolded Constitutional:  No weight loss, night sweats, Fevers, chills, fatigue.  HEENT:  No headaches, Difficulty swallowing,Tooth/dental problems,Sore throat,  No sneezing, itching, ear ache, nasal congestion, post nasal drip,  Cardio-vascular:  No chest pain, Orthopnea, PND, swelling in lower extremities, anasarca, dizziness, palpitations  GI:  No heartburn, indigestion, abdominal pain, nausea, vomiting, diarrhea, change in bowel habits, loss of appetite  Resp:  No shortness of breath with exertion or at rest. No excess mucus, no productive cough, No non-productive cough, No coughing up of blood.No change in color of mucus.No wheezing.No chest wall  deformity  Skin:  no rash or lesions.  GU:  no dysuria, change in color of urine, no urgency or frequency. No flank pain.  Musculoskeletal:  No joint pain or swelling. No decreased range of motion.  back pain.  Psych:  No change in mood or affect. No depression or anxiety. No memory loss.   Past Medical History  Diagnosis Date  . CAD (coronary artery disease)     S/p PTCA / stenting (last cath 2004, multiple LAD stents, 2 stents in the right coronary artery all patent)  . 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 Started coumadin  . HTN (hypertension)   . Restless leg syndrome   . PMR (polymyalgia rheumatica)   . Axillary abscess   . Anxiety   . Dementia   . Complication of anesthesia   . PONV (postoperative nausea and vomiting)   . GERD (gastroesophageal reflux disease)   . Cancer    Past Surgical History  Procedure Laterality Date  . Colectomy    . Stent      cardiac x 8 stents.  . Incision and drainage      bilateral axillary, non specific staff  . Incision and drainage abscess  09/28/2012    Procedure: INCISION AND DRAINAGE ABSCESS;  Surgeon: Zenovia Jarred, MD;  Location: [place];  Service: General;  Laterality: Bilateral;  . Esophagogastroduodenoscopy (egd) with esophageal dilation  10/10/2012    Procedure: ESOPHAGOGASTRODUODENOSCOPY (EGD) WITH ESOPHAGEAL DILATION;  Surgeon: Inda Castle, MD;  Location: [place];  Service: Endoscopy;  Laterality: N/A;  . Breast lumpectomy    . Heart stents  x 8  Social History:  reports that she has never smoked. She has never used smokeless tobacco. She reports that she does not drink alcohol or use illicit drugs.  Allergies  Allergen Reactions  . Ambien [Zolpidem Tartrate] Nausea And Vomiting  . Codeine Phosphate Nausea And Vomiting  . Darvocet [Propoxyphene N-Acetaminophen] Nausea And Vomiting  . Promethazine Hcl Nausea And Vomiting  . Sulfamethoxazole Other (See Comments)    Pt doesn't remember reaction    . Vicodin [Hydrocodone-Acetaminophen] Other (See Comments)    Unknown reaction  . Amoxicillin Nausea And Vomiting and Rash  . Penicillins Nausea And Vomiting and Rash   family history.  -pt unable to recall due to dementia  Prior to Admission medications   Medication Sig Start Date End Date Taking? Authorizing Provider  acetaminophen (TYLENOL) 500 MG tablet Take 500 mg by mouth 3 (three) times daily as needed for moderate pain (NOT MORE THAN 3,000 MG IN 24 HOUR PERIOD). For pain   Yes Historical Provider, MD  amLODipine (NORVASC) 10 MG tablet Take 10 mg by mouth every morning.    Yes Historical Provider, MD  aspirin EC 81 MG tablet Take 1 tablet (81 mg total) by mouth daily. 03/01/14  Yes Belkys A Regalado, MD  atorvastatin (LIPITOR) 20 MG tablet Take 10 mg by mouth every evening.    Yes Historical Provider, MD  benzonatate (TESSALON) 200 MG capsule Take 200 mg by mouth 3 (three) times daily as needed for cough.   Yes Historical Provider, MD  cholecalciferol (VITAMIN D) 1000 UNITS tablet Take 2,000 Units by mouth daily with lunch.    Yes Historical Provider, MD  cholestyramine light (PREVALITE) 4 G packet Take 4 g by mouth daily as needed (for loose bowels).   Yes Historical Provider, MD  donepezil (ARICEPT) 10 MG tablet Take 10 mg by mouth every morning.   Yes Historical Provider, MD  Doxepin HCl 6 MG TABS Take 6 mg by mouth at bedtime.   Yes Historical Provider, MD  ferrous sulfate 325 (65 FE) MG EC tablet Take 325 mg by mouth daily with breakfast.   Yes Historical Provider, MD  fluticasone (FLONASE) 50 MCG/ACT nasal spray Place 1 spray into both nostrils daily as needed for allergies or rhinitis.   Yes Historical Provider, MD  ipratropium (ATROVENT) 0.03 % nasal spray Place 2 sprays into both nostrils 3 (three) times daily as needed for rhinitis.   Yes Historical Provider, MD  loratadine (CLARITIN) 10 MG tablet Take 10 mg by mouth daily as needed for allergies.   Yes Historical Provider, MD   LORazepam (ATIVAN) 0.5 MG tablet Take 0.25 mg by mouth every 8 (eight) hours as needed for anxiety or sleep.  06/14/12  Yes Ripudeep Krystal Eaton, MD  losartan (COZAAR) 100 MG tablet Take 50 mg by mouth at bedtime.    Yes Historical Provider, MD  mirabegron ER (MYRBETRIQ) 25 MG TB24 Take 25 mg by mouth every evening.    Yes Historical Provider, MD  mupirocin ointment (BACTROBAN) 2 % Apply 1 application topically daily with lunch.    Yes Historical Provider, MD  nitroGLYCERIN (NITROSTAT) 0.4 MG SL tablet Place 0.4 mg under the tongue every 5 (five) minutes as needed for chest pain.   Yes Historical Provider, MD  pantoprazole (PROTONIX) 40 MG tablet Take 40 mg by mouth 2 (two) times daily as needed (for heartburn).   Yes Historical Provider, MD  Pediatric Multiple Vit-C-FA (CHILDRENS CHEWABLE MULTI VITS PO) Take 1 tablet by mouth daily with lunch.  Yes Historical Provider, MD  potassium chloride SA (K-DUR,KLOR-CON) 20 MEQ tablet Take 20 mEq by mouth daily with lunch.    Yes Historical Provider, MD  predniSONE (DELTASONE) 1 MG tablet Take 2 mg by mouth every morning.    Yes Historical Provider, MD  rOPINIRole (REQUIP) 1 MG tablet Take 1 mg by mouth 3 (three) times daily. 64m at noon, 160m2 hours before bed, then 50m76mt bedtime   Yes Historical Provider, MD  sertraline (ZOLOFT) 25 MG tablet Take 25 mg by mouth daily.   Yes Historical Provider, MD  traMADol (ULTRAM) 50 MG tablet Take 25 mg by mouth 3 (three) times daily as needed for pain.    Yes Historical Provider, MD  warfarin (COUMADIN) 2.5 MG tablet Take 2.5 mg by mouth every evening.    Yes Historical Provider, MD   Physical Exam: Filed Vitals:   07/20/15 2145 07/20/15 2153 07/20/15 2200 07/20/15 2315  BP: 189/58 189/58 160/111 158/60  Pulse: 58 66 56 56  Resp: _0 SpO2: 97%  97% 98%    Wt Readings from Last 3 Encounters:  03/07/14 67.314 kg (148 lb 6.4 oz)  02/28/14 67.042 kg (147 lb 12.8 oz)  05/08/13 67.223 kg (148 lb 3.2 oz)     General:  Appears calm and comfortable, no distress, AAOx self, place and partly to time Eyes: PERRL, normal lids, irises & conjunctiva ENT: grossly normal hearing, lips & tongue Neck: no LAD, masses or thyromegaly Cardiovascular: RRR, no m/r/g. No LE edema. Telemetry: SR, no arrhythmias  Respiratory: CTA bilaterally, no w/r/r. Normal respiratory effort. Abdomen: soft, ntnd Skin: no rash or induration seen on limited exam Musculoskeletal: grossly normal tone BUE/BLE Psychiatric: grossly normal mood and affect, speech fluent and appropriate Neurologic: moves all extremities          Labs on Admission:  Basic Metabolic Panel:  Recent Labs Lab 07/20/15 1857  NA 137  K 4.2  CL 108  CO2 22  GLUCOSE 107*  BUN 12  CREATININE 0.87  CALCIUM 9.1   Liver Function Tests: No results for input(s): AST, ALT, ALKPHOS, BILITOT, PROT, ALBUMIN in the last 168 hours. No results for input(s): LIPASE, AMYLASE in the last 168 hours. No results for input(s): AMMONIA in the last 168 hours. CBC:  Recent Labs Lab 07/20/15 1857  WBC 8.2  NEUTROABS 6.4  HGB 12.2  HCT 38.7  MCV 90.0  PLT 274   Cardiac Enzymes: No results for input(s): CKTOTAL, CKMB, CKMBINDEX, TROPONINI in the last 168 hours.  BNP (last 3 results) No results for input(s): BNP in the last 8760 hours.  ProBNP (last 3 results) No results for input(s): PROBNP in the last 8760 hours.  CBG: No results for input(s): GLUCAP in the last 168 hours.  Radiological Exams on Admission: Mr Lumbar Spine Wo Contrast  07/20/2015   CLINICAL DATA:  Low back pain with gait dysfunction, sudden onset after rolling over in bed.  EXAM: MRI LUMBAR SPINE WITHOUT CONTRAST  TECHNIQUE: Multiplanar, multisequence MR imaging of the lumbar spine was performed. No intravenous contrast was administered.  COMPARISON:  None.  FINDINGS: T12 and L3 superior endplate fractures are remote given there is no associated marrow edema (minimal fluid signal  along the L3 superior endplate is likely degenerative). There is notable T2 hyperintensity within the L4-5 disc, but this is considered degenerative rather than infectious given vacuum phenomenon, history, and intact subchondral bone.  Normal conus signal and morphology.  Colonic diverticulosis.  At least 4 cm left ovarian cyst, which has enlarged since abdominal CT in 2008. On axial imaging, there may be septations superiorly. Mild ectasia of the infrarenal aorta.  Numerous renal cysts. 5 mm pancreatic body cyst, stable to minimally enlarged since 2009.  Degenerative changes:  L2-L3: Mild disc bulging and ligament overgrowth. No herniation or stenosis.  L3-L4: Mild annulus bulging and ligament overgrowth. There is crowding of the subarticular recesses without neural compression.  L4-L5: Circumferential disc bulging with facet and ligament overgrowth causes moderate to advanced canal stenosis with minimal subarachnoid space remaining. Stenosis is greater on the left where there is subarticular recess impingement on L5. Patent foramina.  L5-S1:Facet arthropathy with mild bony and ligamentous overgrowth. Mild retrolisthesis. No herniation or impingement.  IMPRESSION: 1. No acute finding. 2. Remote T12 and L3 superior endplate fractures. 3. L4-5 moderate to advanced degenerative canal stenosis. 4. 4 cm partly septated left ovarian cyst which has slowly enlarged since 2008 abdominal CT. If clinically warranted, this could be evaluated with outpatient pelvic sonography.   Electronically Signed   By: Monte Fantasia M.D.   On: 07/20/2015 20:17    Assessment/Plan    Intractable back pain -MRI without acute findings -suspect musculoskeletal in etiology, now improving -try Tylenol and tramadol PRN, also check UA and ESR, since h/o PMR -PT/OT eval -Flexeril PRN    CAD (coronary artery disease) -stable, continue ASA/statin    H/o  VTE (venous thromboembolism) -continue warfarin per pharmacy    PMR  (polymyalgia rheumatica) -on very low dose Prednisone, do not think she needs stress dose steroids, check ESR   HTN -stable, continue home meds  Code Status: Full Code DVT Prophylaxis:on warfarin Family Communication: none at bedside, son just left hospital per staff Disposition Plan: observation  Time spent: 64mn  JOSEPH,PREETHA [facility] Pager [phone]

## 2015-07-20 NOTE — ED Provider Notes (Signed)
CSN: [id number]     Arrival date & time 07/20/15  1729 History   First MD Initiated Contact with Patient 07/20/15 1742     Chief Complaint  Patient presents with  . Back Pain     (Consider location/radiation/quality/duration/timing/severity/associated sxs/prior Treatment) HPI Patient reports that she rolled over in bed last night and felt a pop sensation in her lower mid back. She states it felt like something had moved. At that time she went back to sleep. This morning when she awakened, she had too much pain to be helping get out of her bed. Her son tried to help her to a bedside commode but she was unable to assist with walking or transferring. She reports the pain is in the center of her back is very sharp. It does seem to radiate more into her right leg. With movements or activity the right leg the pain intensifies. At the baseline she walks with a walker and can take care of basic needs in her home. She states she's had some back pain intermittently in the past but never anything like this. She has not recently been ill. She has not recently had abdominal pain difficulty urinating or pain or burning. She was feeling well prior to this episode. Past Medical History  Diagnosis Date  . CAD (coronary artery disease)     S/p PTCA / stenting (last cath 2004, multiple LAD stents, 2 stents in the right coronary artery all patent)  . 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 Started coumadin  . HTN (hypertension)   . Restless leg syndrome   . PMR (polymyalgia rheumatica)   . Axillary abscess   . Anxiety   . Dementia   . Complication of anesthesia   . PONV (postoperative nausea and vomiting)   . GERD (gastroesophageal reflux disease)   . Cancer    Past Surgical History  Procedure Laterality Date  . Colectomy    . Stent      cardiac x 8 stents.  . Incision and drainage      bilateral axillary, non specific staff  . Incision and drainage abscess  09/28/2012    Procedure:  INCISION AND DRAINAGE ABSCESS;  Surgeon: Zenovia Jarred, MD;  Location: [facility];  Service: General;  Laterality: Bilateral;  . Esophagogastroduodenoscopy (egd) with esophageal dilation  10/10/2012    Procedure: ESOPHAGOGASTRODUODENOSCOPY (EGD) WITH ESOPHAGEAL DILATION;  Surgeon: Inda Castle, MD;  Location: [facility];  Service: Endoscopy;  Laterality: N/A;  . Breast lumpectomy    . Heart stents  x 8   History reviewed. No pertinent family history. Social History  Substance Use Topics  . Smoking status: Never Smoker   . Smokeless tobacco: Never Used  . Alcohol Use: No   OB History    No data available     Review of Systems 10 Systems reviewed and are negative for acute change except as noted in the HPI.    Allergies  Ambien; Codeine phosphate; Darvocet; Promethazine hcl; Sulfamethoxazole; Vicodin; Amoxicillin; and Penicillins  Home Medications   Prior to Admission medications   Medication Sig Start Date End Date Taking? Authorizing Provider  acetaminophen (TYLENOL) 500 MG tablet Take 500 mg by mouth 3 (three) times daily as needed for moderate pain (NOT MORE THAN 3,000 MG IN 24 HOUR PERIOD). For pain   Yes Historical Provider, MD  amLODipine (NORVASC) 10 MG tablet Take 10 mg by mouth every morning.    Yes Historical Provider, MD  aspirin  EC 81 MG tablet Take 1 tablet (81 mg total) by mouth daily. 03/01/14  Yes Belkys A Regalado, MD  atorvastatin (LIPITOR) 20 MG tablet Take 10 mg by mouth every evening.    Yes Historical Provider, MD  benzonatate (TESSALON) 200 MG capsule Take 200 mg by mouth 3 (three) times daily as needed for cough.   Yes Historical Provider, MD  cholecalciferol (VITAMIN D) 1000 UNITS tablet Take 2,000 Units by mouth daily with lunch.    Yes Historical Provider, MD  cholestyramine light (PREVALITE) 4 G packet Take 4 g by mouth daily as needed (for loose bowels).   Yes Historical Provider, MD  donepezil (ARICEPT) 10 MG tablet Take 10 mg by mouth every morning.    Yes Historical Provider, MD  Doxepin HCl 6 MG TABS Take 6 mg by mouth at bedtime.   Yes Historical Provider, MD  ferrous sulfate 325 (65 FE) MG EC tablet Take 325 mg by mouth daily with breakfast.   Yes Historical Provider, MD  fluticasone (FLONASE) 50 MCG/ACT nasal spray Place 1 spray into both nostrils daily as needed for allergies or rhinitis.   Yes Historical Provider, MD  ipratropium (ATROVENT) 0.03 % nasal spray Place 2 sprays into both nostrils 3 (three) times daily as needed for rhinitis.   Yes Historical Provider, MD  loratadine (CLARITIN) 10 MG tablet Take 10 mg by mouth daily as needed for allergies.   Yes Historical Provider, MD  LORazepam (ATIVAN) 0.5 MG tablet Take 0.25 mg by mouth every 8 (eight) hours as needed for anxiety or sleep.  06/14/12  Yes Ripudeep Krystal Eaton, MD  losartan (COZAAR) 100 MG tablet Take 50 mg by mouth at bedtime.    Yes Historical Provider, MD  mirabegron ER (MYRBETRIQ) 25 MG TB24 Take 25 mg by mouth every evening.    Yes Historical Provider, MD  mupirocin ointment (BACTROBAN) 2 % Apply 1 application topically daily with lunch.    Yes Historical Provider, MD  nitroGLYCERIN (NITROSTAT) 0.4 MG SL tablet Place 0.4 mg under the tongue every 5 (five) minutes as needed for chest pain.   Yes Historical Provider, MD  pantoprazole (PROTONIX) 40 MG tablet Take 40 mg by mouth 2 (two) times daily as needed (for heartburn).   Yes Historical Provider, MD  Pediatric Multiple Vit-C-FA (CHILDRENS CHEWABLE MULTI VITS PO) Take 1 tablet by mouth daily with lunch.    Yes Historical Provider, MD  potassium chloride SA (K-DUR,KLOR-CON) 20 MEQ tablet Take 20 mEq by mouth daily with lunch.    Yes Historical Provider, MD  predniSONE (DELTASONE) 1 MG tablet Take 2 mg by mouth every morning.    Yes Historical Provider, MD  rOPINIRole (REQUIP) 1 MG tablet Take 1 mg by mouth 3 (three) times daily. 1mg  at noon, 1mg  2 hours before bed, then 1mg  at bedtime   Yes Historical Provider, MD  sertraline  (ZOLOFT) 25 MG tablet Take 25 mg by mouth daily.   Yes Historical Provider, MD  traMADol (ULTRAM) 50 MG tablet Take 25 mg by mouth 3 (three) times daily as needed for pain.    Yes Historical Provider, MD  warfarin (COUMADIN) 2.5 MG tablet Take 2.5 mg by mouth every evening.    Yes Historical Provider, MD   BP 158/60 mmHg  Pulse 56  Resp 17  SpO2 98% Physical Exam  Constitutional: She is oriented to person, place, and time. She appears well-developed and well-nourished.  HENT:  Head: Normocephalic and atraumatic.  Eyes: EOM are normal.  Neck: Neck supple.  Cardiovascular: Normal rate, regular rhythm, normal heart sounds and intact distal pulses.   Pulmonary/Chest: Effort normal and breath sounds normal.  Abdominal: Soft. Bowel sounds are normal. She exhibits no distension. There is no tenderness.  Musculoskeletal: She exhibits no edema or tenderness.  The patient is able to independently elevate each leg off of the bed temporarily. She does have increased pain with elevation of the right lower extremity. She does endorse sensation symmetric to light touch. Patient is able to perform plantar extension and flexion.  Neurological: She is alert and oriented to person, place, and time. She has normal strength. Coordination normal. GCS eye subscore is 4. GCS verbal subscore is 5. GCS motor subscore is 6.  Skin: Skin is warm, dry and intact.  Psychiatric: She has a normal mood and affect.    ED Course  Procedures (including critical care time) Labs Review Labs Reviewed  BASIC METABOLIC PANEL - Abnormal; Notable for the following:    Glucose, Bld 107 (*)    GFR calc non Af Amer 57 (*)    All other components within normal limits  PROTIME-INR - Abnormal; Notable for the following:    Prothrombin Time 31.3 (*)    INR 3.08 (*)    All other components within normal limits  CBC WITH DIFFERENTIAL/PLATELET    Imaging Review Mr Lumbar Spine Wo Contrast  07/20/2015   CLINICAL DATA:  Low back  pain with gait dysfunction, sudden onset after rolling over in bed.  EXAM: MRI LUMBAR SPINE WITHOUT CONTRAST  TECHNIQUE: Multiplanar, multisequence MR imaging of the lumbar spine was performed. No intravenous contrast was administered.  COMPARISON:  None.  FINDINGS: T12 and L3 superior endplate fractures are remote given there is no associated marrow edema (minimal fluid signal along the L3 superior endplate is likely degenerative). There is notable T2 hyperintensity within the L4-5 disc, but this is considered degenerative rather than infectious given vacuum phenomenon, history, and intact subchondral bone.  Normal conus signal and morphology.  Colonic diverticulosis.  At least 4 cm left ovarian cyst, which has enlarged since abdominal CT in 2008. On axial imaging, there may be septations superiorly. Mild ectasia of the infrarenal aorta.  Numerous renal cysts. 5 mm pancreatic body cyst, stable to minimally enlarged since 2009.  Degenerative changes:  L2-L3: Mild disc bulging and ligament overgrowth. No herniation or stenosis.  L3-L4: Mild annulus bulging and ligament overgrowth. There is crowding of the subarticular recesses without neural compression.  L4-L5: Circumferential disc bulging with facet and ligament overgrowth causes moderate to advanced canal stenosis with minimal subarachnoid space remaining. Stenosis is greater on the left where there is subarticular recess impingement on L5. Patent foramina.  L5-S1:Facet arthropathy with mild bony and ligamentous overgrowth. Mild retrolisthesis. No herniation or impingement.  IMPRESSION: 1. No acute finding. 2. Remote T12 and L3 superior endplate fractures. 3. L4-5 moderate to advanced degenerative canal stenosis. 4. 4 cm partly septated left ovarian cyst which has slowly enlarged since 2008 abdominal CT. If clinically warranted, this could be evaluated with outpatient pelvic sonography.   Electronically Signed   By: Monte Fantasia M.D.   On: 07/20/2015 20:17    I have personally reviewed and evaluated these images and lab results as part of my medical decision-making.   EKG Interpretation None      MDM   Final diagnoses:  Intractable back pain  Ambulatory dysfunction   Patient is alert and otherwise well. She has significant back pain which started  with a position change last night. At this point time she has significant difficulty with gait. Patient be admitted for pain with gait dysfunction.    Charlesetta Shanks, MD 07/20/15 [phone]

## 2015-07-21 DIAGNOSIS — I8290 Acute embolism and thrombosis of unspecified vein: Secondary | ICD-10-CM

## 2015-07-21 DIAGNOSIS — M549 Dorsalgia, unspecified: Secondary | ICD-10-CM

## 2015-07-21 DIAGNOSIS — F0390 Unspecified dementia without behavioral disturbance: Secondary | ICD-10-CM | POA: Diagnosis not present

## 2015-07-21 DIAGNOSIS — M545 Low back pain: Secondary | ICD-10-CM | POA: Diagnosis not present

## 2015-07-21 DIAGNOSIS — M353 Polymyalgia rheumatica: Secondary | ICD-10-CM | POA: Diagnosis not present

## 2015-07-21 LAB — URINALYSIS, ROUTINE W REFLEX MICROSCOPIC
BILIRUBIN URINE: NEGATIVE
GLUCOSE, UA: 250 mg/dL — AB
KETONES UR: NEGATIVE mg/dL
Nitrite: POSITIVE — AB
PH: 5.5 (ref 5.0–8.0)
Protein, ur: NEGATIVE mg/dL
SPECIFIC GRAVITY, URINE: 1.014 (ref 1.005–1.030)
Urobilinogen, UA: 0.2 mg/dL (ref 0.0–1.0)

## 2015-07-21 LAB — CBC
HCT: 36.8 % (ref 36.0–46.0)
HEMOGLOBIN: 11.5 g/dL — AB (ref 12.0–15.0)
MCH: 28.4 pg (ref 26.0–34.0)
MCHC: 31.3 g/dL (ref 30.0–36.0)
MCV: 90.9 fL (ref 78.0–100.0)
PLATELETS: 270 10*3/uL (ref 150–400)
RBC: 4.05 MIL/uL (ref 3.87–5.11)
RDW: 14.5 % (ref 11.5–15.5)
WBC: 7.5 10*3/uL (ref 4.0–10.5)

## 2015-07-21 LAB — URINE MICROSCOPIC-ADD ON

## 2015-07-21 LAB — BASIC METABOLIC PANEL
Anion gap: 7 (ref 5–15)
BUN: 9 mg/dL (ref 6–20)
CALCIUM: 8.6 mg/dL — AB (ref 8.9–10.3)
CO2: 22 mmol/L (ref 22–32)
Chloride: 106 mmol/L (ref 101–111)
Creatinine, Ser: 0.87 mg/dL (ref 0.44–1.00)
GFR calc Af Amer: >60 mL/min (ref >60)
GFR, EST NON AFRICAN AMERICAN: 57 mL/min — AB (ref >60)
GLUCOSE: 88 mg/dL (ref 65–99)
Potassium: 3.9 mmol/L (ref 3.5–5.1)
Sodium: 135 mmol/L (ref 135–145)

## 2015-07-21 LAB — PROTIME-INR
INR: 2.77 — ABNORMAL HIGH (ref 0.00–1.49)
PROTHROMBIN TIME: 28.8 s — AB (ref 11.6–15.2)

## 2015-07-21 LAB — SEDIMENTATION RATE: SED RATE: 44 mm/h — AB (ref 0–22)

## 2015-07-21 MED ORDER — ASPIRIN EC 81 MG PO TBEC
81.0000 mg | DELAYED_RELEASE_TABLET | Freq: Every day | ORAL | Status: DC
  Administered 2015-07-21 – 2015-07-22 (×2): 81 mg via ORAL
  Filled 2015-07-21 (×2): qty 1

## 2015-07-21 MED ORDER — FLUTICASONE PROPIONATE 50 MCG/ACT NA SUSP: 1.0000 | Freq: Every day | NASAL | Status: DC | PRN

## 2015-07-21 MED ORDER — ATORVASTATIN CALCIUM 10 MG PO TABS
10.0000 mg | ORAL_TABLET | Freq: Every evening | ORAL | Status: DC
  Administered 2015-07-21: 10 mg via ORAL
  Filled 2015-07-21: qty 1

## 2015-07-21 MED ORDER — ROPINIROLE HCL 1 MG PO TABS
1.0000 mg | ORAL_TABLET | Freq: Three times a day (TID) | ORAL | Status: DC
  Administered 2015-07-21 – 2015-07-22 (×5): 1 mg via ORAL
  Filled 2015-07-21 (×5): qty 1

## 2015-07-21 MED ORDER — SERTRALINE HCL 25 MG PO TABS
25.0000 mg | ORAL_TABLET | Freq: Every day | ORAL | Status: DC
  Administered 2015-07-21 – 2015-07-22 (×2): 25 mg via ORAL
  Filled 2015-07-21 (×2): qty 1

## 2015-07-21 MED ORDER — WARFARIN - PHARMACIST DOSING INPATIENT
Freq: Every day | Status: DC
Start: 2015-07-21 — End: 2015-07-22
  Administered 2015-07-21: 17:00:00

## 2015-07-21 MED ORDER — PREDNISONE 1 MG PO TABS
2.0000 mg | ORAL_TABLET | Freq: Every day | ORAL | Status: DC
  Filled 2015-07-21 (×2): qty 2

## 2015-07-21 MED ORDER — PANTOPRAZOLE SODIUM 40 MG PO TBEC
40.0000 mg | DELAYED_RELEASE_TABLET | Freq: Two times a day (BID) | ORAL | Status: DC | PRN
Start: 2015-07-21 — End: 2015-07-22

## 2015-07-21 MED ORDER — WARFARIN SODIUM 1 MG PO TABS
1.0000 mg | ORAL_TABLET | ORAL | Status: AC
Start: 2015-07-21 — End: 2015-07-21
  Administered 2015-07-21: 1 mg via ORAL
  Filled 2015-07-21: qty 1

## 2015-07-21 MED ORDER — DONEPEZIL HCL 10 MG PO TABS
10.0000 mg | ORAL_TABLET | Freq: Every morning | ORAL | Status: DC
  Administered 2015-07-21 – 2015-07-22 (×2): 10 mg via ORAL
  Filled 2015-07-21 (×2): qty 1

## 2015-07-21 MED ORDER — DOXEPIN HCL 10 MG/ML PO CONC
6.0000 mg | Freq: Every day | ORAL | Status: DC
  Administered 2015-07-21 (×2): 6 mg via ORAL
  Filled 2015-07-21 (×3): qty 0.6

## 2015-07-21 MED ORDER — AMLODIPINE BESYLATE 10 MG PO TABS
10.0000 mg | ORAL_TABLET | Freq: Every morning | ORAL | Status: DC
  Administered 2015-07-21 – 2015-07-22 (×2): 10 mg via ORAL
  Filled 2015-07-21 (×2): qty 1

## 2015-07-21 MED ORDER — LORAZEPAM 0.5 MG PO TABS
0.2500 mg | ORAL_TABLET | Freq: Three times a day (TID) | ORAL | Status: DC | PRN
  Administered 2015-07-21: 0.25 mg via ORAL
  Filled 2015-07-21: qty 1

## 2015-07-21 MED ORDER — LOSARTAN POTASSIUM 50 MG PO TABS
50.0000 mg | ORAL_TABLET | Freq: Every day | ORAL | Status: DC
  Administered 2015-07-21 (×2): 50 mg via ORAL
  Filled 2015-07-21 (×2): qty 1

## 2015-07-21 MED ORDER — CYCLOBENZAPRINE HCL 10 MG PO TABS
5.0000 mg | ORAL_TABLET | Freq: Three times a day (TID) | ORAL | Status: DC
  Administered 2015-07-21 – 2015-07-22 (×4): 5 mg via ORAL
  Filled 2015-07-21 (×4): qty 1

## 2015-07-21 MED ORDER — PREDNISONE 20 MG PO TABS
40.0000 mg | ORAL_TABLET | Freq: Every day | ORAL | Status: DC
  Administered 2015-07-21 – 2015-07-22 (×2): 40 mg via ORAL
  Filled 2015-07-21 (×2): qty 2

## 2015-07-21 MED ORDER — NITROGLYCERIN 0.4 MG SL SUBL: 0.4000 mg | SUBLINGUAL_TABLET | SUBLINGUAL | Status: DC | PRN

## 2015-07-21 MED ORDER — MIRABEGRON ER 25 MG PO TB24
25.0000 mg | ORAL_TABLET | Freq: Every evening | ORAL | Status: DC
  Administered 2015-07-21: 25 mg via ORAL
  Filled 2015-07-21 (×2): qty 1

## 2015-07-21 MED ORDER — WARFARIN SODIUM 1 MG PO TABS
1.0000 mg | ORAL_TABLET | Freq: Once | ORAL | Status: AC
  Administered 2015-07-21: 1 mg via ORAL
  Filled 2015-07-21: qty 1

## 2015-07-21 NOTE — Progress Notes (Signed)
ANTICOAGULATION CONSULT NOTE - Initial Consult  Pharmacy Consult for Warfarin Indication: h/o DVT/PE  Allergies  Allergen Reactions  . Ambien [Zolpidem Tartrate] Nausea And Vomiting  . Codeine Phosphate Nausea And Vomiting  . Darvocet [Propoxyphene N-Acetaminophen] Nausea And Vomiting  . Promethazine Hcl Nausea And Vomiting  . Sulfamethoxazole Other (See Comments)    Pt doesn't remember reaction  . Vicodin [Hydrocodone-Acetaminophen] Other (See Comments)    Unknown reaction  . Amoxicillin Nausea And Vomiting and Rash  . Penicillins Nausea And Vomiting and Rash    Patient Measurements:   Heparin Dosing Weight:   Vital Signs: Temp: 97.6 F (36.4 C) (09/19 0014) Temp Source: Oral (09/19 0014) BP: 111/91 mmHg (09/19 0014) Pulse Rate: 57 (09/19 0014)  Labs:  Recent Labs  07/20/15 1857  HGB 12.2  HCT 38.7  PLT 274  LABPROT 31.3*  INR 3.08*  CREATININE 0.87    CrCl cannot be calculated (Unknown ideal weight.).   Medical History: Past Medical History  Diagnosis Date  . CAD (coronary artery disease)     S/p PTCA / stenting (last cath 2004, multiple LAD stents, 2 stents in the right coronary artery all patent)  . 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 Started coumadin  . HTN (hypertension)   . Restless leg syndrome   . PMR (polymyalgia rheumatica)   . Axillary abscess   . Anxiety   . Dementia   . Complication of anesthesia   . PONV (postoperative nausea and vomiting)   . GERD (gastroesophageal reflux disease)   . Cancer     Medications:  Prescriptions prior to admission  Medication Sig Dispense Refill Last Dose  . acetaminophen (TYLENOL) 500 MG tablet Take 500 mg by mouth 3 (three) times daily as needed for moderate pain (NOT MORE THAN 3,000 MG IN 24 HOUR PERIOD). For pain   Past Month at Unknown time  . amLODipine (NORVASC) 10 MG tablet Take 10 mg by mouth every morning.    07/20/2015 at Unknown time  . aspirin EC 81 MG tablet Take 1 tablet (81  mg total) by mouth daily. 30 tablet 0 07/20/2015 at Unknown time  . atorvastatin (LIPITOR) 20 MG tablet Take 10 mg by mouth every evening.    07/19/2015 at Unknown time  . benzonatate (TESSALON) 200 MG capsule Take 200 mg by mouth 3 (three) times daily as needed for cough.   Past Month at Unknown time  . cholecalciferol (VITAMIN D) 1000 UNITS tablet Take 2,000 Units by mouth daily with lunch.    07/20/2015 at Unknown time  . cholestyramine light (PREVALITE) 4 G packet Take 4 g by mouth daily as needed (for loose bowels).   07/20/2015 at Unknown time  . donepezil (ARICEPT) 10 MG tablet Take 10 mg by mouth every morning.   07/20/2015 at Unknown time  . Doxepin HCl 6 MG TABS Take 6 mg by mouth at bedtime.   07/19/2015 at Unknown time  . ferrous sulfate 325 (65 FE) MG EC tablet Take 325 mg by mouth daily with breakfast.   07/20/2015 at Unknown time  . fluticasone (FLONASE) 50 MCG/ACT nasal spray Place 1 spray into both nostrils daily as needed for allergies or rhinitis.   Past Month at Unknown time  . ipratropium (ATROVENT) 0.03 % nasal spray Place 2 sprays into both nostrils 3 (three) times daily as needed for rhinitis.   Past Month at Unknown time  . loratadine (CLARITIN) 10 MG tablet Take 10 mg by mouth daily  as needed for allergies.   Past Month at Unknown time  . LORazepam (ATIVAN) 0.5 MG tablet Take 0.25 mg by mouth every 8 (eight) hours as needed for anxiety or sleep.    Past Month at Unknown time  . losartan (COZAAR) 100 MG tablet Take 50 mg by mouth at bedtime.    07/19/2015 at Unknown time  . mirabegron ER (MYRBETRIQ) 25 MG TB24 Take 25 mg by mouth every evening.    07/19/2015 at Unknown time  . mupirocin ointment (BACTROBAN) 2 % Apply 1 application topically daily with lunch.    07/20/2015 at Unknown time  . nitroGLYCERIN (NITROSTAT) 0.4 MG SL tablet Place 0.4 mg under the tongue every 5 (five) minutes as needed for chest pain.   not used  . pantoprazole (PROTONIX) 40 MG tablet Take 40 mg by mouth 2  (two) times daily as needed (for heartburn).   Past Month at Unknown time  . Pediatric Multiple Vit-C-FA (CHILDRENS CHEWABLE MULTI VITS PO) Take 1 tablet by mouth daily with lunch.    07/20/2015 at Unknown time  . potassium chloride SA (K-DUR,KLOR-CON) 20 MEQ tablet Take 20 mEq by mouth daily with lunch.    07/20/2015 at Unknown time  . predniSONE (DELTASONE) 1 MG tablet Take 2 mg by mouth every morning.    07/20/2015 at Unknown time  . rOPINIRole (REQUIP) 1 MG tablet Take 1 mg by mouth 3 (three) times daily. 1mg  at noon, 1mg  2 hours before bed, then 1mg  at bedtime   07/20/2015 at Unknown time  . sertraline (ZOLOFT) 25 MG tablet Take 25 mg by mouth daily.   07/20/2015 at Unknown time  . traMADol (ULTRAM) 50 MG tablet Take 25 mg by mouth 3 (three) times daily as needed for pain.    07/20/2015 at Unknown time  . warfarin (COUMADIN) 2.5 MG tablet Take 2.5 mg by mouth every evening.    07/19/2015 at Unknown time   Scheduled:  . amLODipine  10 mg Oral q morning - 10a  . aspirin EC  81 mg Oral Daily  . atorvastatin  10 mg Oral QPM  . donepezil  10 mg Oral q morning - 10a  . Doxepin HCl  6 mg Oral QHS  . losartan  50 mg Oral QHS  . mirabegron ER  25 mg Oral QPM  . predniSONE  2 mg Oral q morning - 10a  . rOPINIRole  1 mg Oral TID  . sertraline  25 mg Oral Daily   Infusions:    Assessment: 81yo female with history of DVT/PE on warfarin, CAD, HTN and polymyalgia rheumatica presents with back pain. Pharmacy is consulted to dose warfarin for history of DVT/PE. INR on presentation is slightly supratherapeutic at 3.08, CBC wnl, sCr 0.87.  PTA Warfarin Dose: 2.5mg /day with last dose 9/17  Goal of Therapy:  INR 2-3 Monitor platelets by anticoagulation protocol: Yes   Plan:  Warfarin 1mg  tonight x1 Daily INR/CBC Monitor s/sx of bleeding  Andrey Cota. Diona Foley, PharmD Clinical Pharmacist Pager [phone] 07/21/2015,12:22 AM

## 2015-07-21 NOTE — Evaluation (Signed)
Occupational Therapy Evaluation Patient Details Name: [name] [name] MRN: [id number] DOB: [birth year] Today's Date: 07/21/2015    History of Present Illness Back pain   Clinical Impression   Pt admitted with above diagnosis resulting in deficits listed below. Pt presenting with mild confusion, oriented to place and person. Pt reports that she had an aide assisting with ADLs PTA. Pt is currently at baseline for ADL activities. In order for pt to safely d/c home, 24 hour supervision is required due to decreased independence and safety with mobility. No family present during evaluation to confirm if 24/7 supervision is available. At this time not recommending skilled OT services due to pt being at baseline with ADLs.    Follow Up Recommendations  Supervision/Assistance - 24 hour;Other (comment) (If family unable to provide 24/7 supervision, recommend SNF)    Equipment Recommendations  None recommended by OT (pt reports having 3 in 1 and tub seat at home)    Recommendations for Other Services       Precautions / Restrictions Precautions Precautions: Back Restrictions Weight Bearing Restrictions: No      Mobility Bed Mobility        General bed mobility comments: pt found in recliner, returned to recliner at end of session  Transfers Overall transfer level: Needs assistance Equipment used: Rolling walker (2 wheeled) Transfers: Sit to/from Stand Sit to Stand: Min assist         General transfer comment: Verbal cues for hand placement, increased time required for sit > stand    Balance Overall balance assessment: Needs assistance      Standing balance support: Bilateral upper extremity supported Standing balance-Leahy Scale: Poor                              ADL Overall ADL's : At baseline                                       General ADL Comments: Pt reports that an aide would come to her house to assist with bathing and dressing      Vision     Perception     Praxis      Pertinent Vitals/Pain Pain Assessment: 0-10 Pain Score: 2  Pain Location: back Pain Descriptors / Indicators: Discomfort;Grimacing Pain Intervention(s): Limited activity within patient's tolerance;Monitored during session     Hand Dominance     Extremity/Trunk Assessment Upper Extremity Assessment Upper Extremity Assessment: Generalized weakness   Lower Extremity Assessment Lower Extremity Assessment: Defer to PT evaluation       Communication Communication Communication: No difficulties   Cognition Arousal/Alertness: Awake/alert Behavior During Therapy: WFL for tasks assessed/performed Overall Cognitive Status: No family/caregiver present to determine baseline cognitive functioning (mild confusion, oriented to person and place )                     General Comments       Exercises       Shoulder Instructions      Home Living Family/patient expects to be discharged to:: Private residence Living Arrangements: Children;Other (Comment) Available Help at Discharge: Family Type of Home: House Home Access: Stairs to enter [employer] of Steps: 2 Entrance Stairs-Rails: Right Home Layout: One level     Bathroom Shower/Tub: Teacher, early years/pre: Standard     Home Equipment: Environmental consultant -  2 wheels;Bedside commode;Other (comment) (tub seat)   Additional Comments: reports using rw for all ambulation.       Prior Functioning/Environment Level of Independence: Needs assistance    ADL's / Homemaking Assistance Needed: pt reports that PTA an aide came to her home to help her bathe and get dressed. Pt unclear about how often and for how long aide is at the home to assist.   Comments: states able to get up with rw at home     OT Diagnosis: Generalized weakness;Acute pain   OT Problem List:     OT Treatment/Interventions:      OT Goals(Current goals can be found in the care plan section)  Acute Rehab OT Goals Patient Stated Goal: go home  OT Frequency:     Barriers to D/C:            Co-evaluation              End of Session Equipment Utilized During Treatment: Gait belt;Rolling walker  Activity Tolerance: Patient tolerated treatment well Patient left: in chair;with call bell/phone within reach   Time: 1050-1110 OT Time Calculation (min): 20 min Charges:  OT General Charges $OT Visit: 1 Procedure OT Evaluation $Initial OT Evaluation Tier I: 1 Procedure G-Codes: OT G-codes **NOT FOR INPATIENT CLASS** Functional Assessment Tool Used: Clinical judgement Functional Limitation: Self care Self Care Current Status (R1165): At least 60 percent but less than 80 percent impaired, limited or restricted Self Care Goal Status (B9038): At least 60 percent but less than 80 percent impaired, limited or restricted Self Care Discharge Status [phone]): At least 60 percent but less than 80 percent impaired, limited or restricted   Binnie Kand M.S., OTR/L  Pager: [phone]  07/21/2015, 2:01 PM

## 2015-07-21 NOTE — Evaluation (Addendum)
Physical Therapy Evaluation Patient Details Name: [name] [name] MRN: [id number] DOB: Aug 16, 1921 Today's Date: 07/21/2015   History of Present Illness  Back pain  Clinical Impression  Based upon the patient's assessed mobility level at this time, 24 hour supervision is recommended for safety and physical assistance. Patient did demonstrate instability with ambulation although no gross loss of balance occurred. Additionally the patient required moderate physical assistance with bed mobility and transfers. Case management reporting that the patient's son has expressed that he is not able to provide physical assistance at home. Based upon this and the amount of physical assistance currently needed by the patient, SNF is recommended.      Follow Up Recommendations SNF, 24 Hour Supervision    Equipment Recommendations  Other (comment);None recommended by PT (patient reports having rw at home)    Recommendations for Other Services       Precautions / Restrictions Precautions Precautions: Back Restrictions Weight Bearing Restrictions: No      Mobility  Bed Mobility Overal bed mobility: Needs Assistance Bed Mobility: Rolling;Sidelying to Sit Rolling: Mod assist (with bed rail) Sidelying to sit: Mod assist       General bed mobility comments: cues needed througout for logroll sequence, occasional difficulty with following commands, manual cues needed.   Transfers Overall transfer level: Needs assistance Equipment used: Rolling walker (2 wheeled) Transfers: Sit to/from Stand Sit to Stand: Mod assist         General transfer comment: Transfers from bed and chair performed. Cues for hand position needed each time and moderate physical assistance needed.   Ambulation/Gait Ambulation/Gait assistance: Min assist Ambulation Distance (Feet): 20 Feet   Gait Pattern/deviations: Step-through pattern;Decreased step length - right;Decreased step length - left;Shuffle Gait velocity:  decreased   General Gait Details: instability noted with ambulation but no frank loss of balance. Needing assistance for safety throughout session.    Stairs            Wheelchair Mobility    Modified Rankin (Stroke Patients Only)       Balance Overall balance assessment: Needs assistance Sitting-balance support: Single extremity supported Sitting balance-Leahy Scale: Poor     Standing balance support: Bilateral upper extremity supported Standing balance-Leahy Scale: Poor                               Pertinent Vitals/Pain Pain Assessment: 0-10 Pain Score: 2  Pain Location: back Pain Descriptors / Indicators: Other (Comment) (pain  - none at rest, pain with movement) Pain Intervention(s): Monitored during session;Limited activity within patient's tolerance    Home Living Family/patient expects to be discharged to:: Private residence Living Arrangements: Children;Other (Comment) (lives with son) Available Help at Discharge: Family;Other (Comment);Available 24 hours/day (reports son is always home) Type of Home: House Home Access: Stairs to enter Entrance Stairs-Rails: Right Entrance Stairs-Number of Steps: 2 Home Layout: One level Home Equipment: Walker - 2 wheels Additional Comments: reports using rw for all ambulation.     Prior Function Level of Independence: Independent with assistive device(s)         Comments: states able to get up with rw at home      Hand Dominance        Extremity/Trunk Assessment               Lower Extremity Assessment: Generalized weakness         Communication   Communication: No difficulties  Cognition Arousal/Alertness: Awake/alert  Behavior During Therapy: WFL for tasks assessed/performed Overall Cognitive Status: No family/caregiver present to determine baseline cognitive functioning (mild confusion, oriented to year)                      General Comments      Exercises         Assessment/Plan    PT Assessment Patient needs continued PT services  PT Diagnosis Difficulty walking;Abnormality of gait;Generalized weakness;Acute pain   PT Problem List Decreased strength;Decreased range of motion;Decreased activity tolerance;Decreased balance;Decreased mobility;Decreased knowledge of use of DME;Decreased safety awareness;Decreased knowledge of precautions;Pain  PT Treatment Interventions Gait training;DME instruction;Stair training;Functional mobility training;Therapeutic activities;Therapeutic exercise;Balance training;Patient/family education   PT Goals (Current goals can be found in the Care Plan section) Acute Rehab PT Goals Patient Stated Goal: go home  PT Goal Formulation: With patient Time For Goal Achievement: 08/04/15 Potential to Achieve Goals: Fair    Frequency Min 3X/week   Barriers to discharge        Co-evaluation               End of Session Equipment Utilized During Treatment: Gait belt Activity Tolerance: Patient limited by fatigue Patient left: in chair;with call bell/phone within reach;with chair alarm set Nurse Communication: Mobility status;Precautions    Functional Assessment Tool Used: clinical judgment Functional Limitation: Mobility: Walking and moving around Mobility: Walking and Moving Around Current Status [id number]): At least 60 percent but less than 80 percent impaired, limited or restricted Mobility: Walking and Moving Around Goal Status [phone]): At least 40 percent but less than 60 percent impaired, limited or restricted    Time: 0932-0959 PT Time Calculation (min) (ACUTE ONLY): 27 min   Charges:   PT Evaluation $Initial PT Evaluation Tier I: 1 Procedure PT Treatments $Therapeutic Activity: 8-22 mins   PT G Codes:   PT G-Codes **NOT FOR INPATIENT CLASS** Functional Assessment Tool Used: clinical judgment Functional Limitation: Mobility: Walking and moving around Mobility: Walking and Moving Around Current  Status (X9371): At least 60 percent but less than 80 percent impaired, limited or restricted Mobility: Walking and Moving Around Goal Status [id number]): At least 40 percent but less than 60 percent impaired, limited or restricted    Cassell Clement, PT, [place] Pager [phone] Office [phone]  07/21/2015, 12:51 PM

## 2015-07-21 NOTE — Care Management Note (Signed)
Case Management Note  Patient Details  Name: [name] [name] MRN: [id number] Date of Birth: 1921-07-13  Subjective/Objective:                 Admitted with intractable back pain   Action/Plan: Spoke with patient and received permission to speak with patient's son [name]. Contacted Mr Megahn Killings to discuss discharge plan, he stated that patient lives with him and that secondary to his back and heart problems he is unable to provide physical assistance.He would be agreeable to patient going to SNF for short term rehab, she has been to [facility] in the past. Spoke with PT, patient does require assistance with ambulation and bed mobility, recommending SNF. Spoke again with patient, she is agreeable to SNF. Made referral to CSW. Will continue to follow for discharge needs.  Expected Discharge Date:                  Expected Discharge Plan:  Skilled Nursing Facility  In-House Referral:  Clinical Social Work  Discharge planning Services  CM Consult  Post Acute Care Choice:    Choice offered to:     DME Arranged:    DME Agency:     HH Arranged:    [place] Agency:     Status of Service:  In process, will continue to follow  Medicare Important Message Given:    Date Medicare IM Given:    Medicare IM give by:    Date Additional Medicare IM Given:    Additional Medicare Important Message give by:     If discussed at [place] of Stay Meetings, dates discussed:    Additional Comments:  Nila Nephew, RN 07/21/2015, 3:10 PM

## 2015-07-21 NOTE — Progress Notes (Signed)
ANTICOAGULATION CONSULT NOTE - Follow Up Consult  Pharmacy Consult for Coumadin Indication: DVT history  Allergies  Allergen Reactions  . Ambien [Zolpidem Tartrate] Nausea And Vomiting  . Codeine Phosphate Nausea And Vomiting  . Darvocet [Propoxyphene N-Acetaminophen] Nausea And Vomiting  . Promethazine Hcl Nausea And Vomiting  . Sulfamethoxazole Other (See Comments)    Pt doesn't remember reaction  . Vicodin [Hydrocodone-Acetaminophen] Other (See Comments)    Unknown reaction  . Amoxicillin Nausea And Vomiting and Rash  . Penicillins Nausea And Vomiting and Rash    Patient Measurements:    Vital Signs: Temp: 97.7 F (36.5 C) (09/19 0403) Temp Source: Oral (09/19 0403) BP: 151/66 mmHg (09/19 0403) Pulse Rate: 61 (09/19 0403)  Labs:  Recent Labs  07/20/15 1857 07/21/15 0455  HGB 12.2 11.5*  HCT 38.7 36.8  PLT 274 270  LABPROT 31.3* 28.8*  INR 3.08* 2.77*  CREATININE 0.87 0.87    CrCl cannot be calculated (Unknown ideal weight.).   Medications:  Prescriptions prior to admission  Medication Sig Dispense Refill Last Dose  . acetaminophen (TYLENOL) 500 MG tablet Take 500 mg by mouth 3 (three) times daily as needed for moderate pain (NOT MORE THAN 3,000 MG IN 24 HOUR PERIOD). For pain   Past Month at Unknown time  . amLODipine (NORVASC) 10 MG tablet Take 10 mg by mouth every morning.    07/20/2015 at Unknown time  . aspirin EC 81 MG tablet Take 1 tablet (81 mg total) by mouth daily. 30 tablet 0 07/20/2015 at Unknown time  . atorvastatin (LIPITOR) 20 MG tablet Take 10 mg by mouth every evening.    07/19/2015 at Unknown time  . benzonatate (TESSALON) 200 MG capsule Take 200 mg by mouth 3 (three) times daily as needed for cough.   Past Month at Unknown time  . cholecalciferol (VITAMIN D) 1000 UNITS tablet Take 2,000 Units by mouth daily with lunch.    07/20/2015 at Unknown time  . cholestyramine light (PREVALITE) 4 G packet Take 4 g by mouth daily as needed (for loose  bowels).   07/20/2015 at Unknown time  . donepezil (ARICEPT) 10 MG tablet Take 10 mg by mouth every morning.   07/20/2015 at Unknown time  . Doxepin HCl 6 MG TABS Take 6 mg by mouth at bedtime.   07/19/2015 at Unknown time  . ferrous sulfate 325 (65 FE) MG EC tablet Take 325 mg by mouth daily with breakfast.   07/20/2015 at Unknown time  . fluticasone (FLONASE) 50 MCG/ACT nasal spray Place 1 spray into both nostrils daily as needed for allergies or rhinitis.   Past Month at Unknown time  . ipratropium (ATROVENT) 0.03 % nasal spray Place 2 sprays into both nostrils 3 (three) times daily as needed for rhinitis.   Past Month at Unknown time  . loratadine (CLARITIN) 10 MG tablet Take 10 mg by mouth daily as needed for allergies.   Past Month at Unknown time  . LORazepam (ATIVAN) 0.5 MG tablet Take 0.25 mg by mouth every 8 (eight) hours as needed for anxiety or sleep.    Past Month at Unknown time  . losartan (COZAAR) 100 MG tablet Take 50 mg by mouth at bedtime.    07/19/2015 at Unknown time  . mirabegron ER (MYRBETRIQ) 25 MG TB24 Take 25 mg by mouth every evening.    07/19/2015 at Unknown time  . mupirocin ointment (BACTROBAN) 2 % Apply 1 application topically daily with lunch.    07/20/2015 at Unknown  time  . nitroGLYCERIN (NITROSTAT) 0.4 MG SL tablet Place 0.4 mg under the tongue every 5 (five) minutes as needed for chest pain.   not used  . pantoprazole (PROTONIX) 40 MG tablet Take 40 mg by mouth 2 (two) times daily as needed (for heartburn).   Past Month at Unknown time  . Pediatric Multiple Vit-C-FA (CHILDRENS CHEWABLE MULTI VITS PO) Take 1 tablet by mouth daily with lunch.    07/20/2015 at Unknown time  . potassium chloride SA (K-DUR,KLOR-CON) 20 MEQ tablet Take 20 mEq by mouth daily with lunch.    07/20/2015 at Unknown time  . predniSONE (DELTASONE) 1 MG tablet Take 2 mg by mouth every morning.    07/20/2015 at Unknown time  . rOPINIRole (REQUIP) 1 MG tablet Take 1 mg by mouth 3 (three) times daily. 1mg  at  noon, 1mg  2 hours before bed, then 1mg  at bedtime   07/20/2015 at Unknown time  . sertraline (ZOLOFT) 25 MG tablet Take 25 mg by mouth daily.   07/20/2015 at Unknown time  . traMADol (ULTRAM) 50 MG tablet Take 25 mg by mouth 3 (three) times daily as needed for pain.    07/20/2015 at Unknown time  . warfarin (COUMADIN) 2.5 MG tablet Take 2.5 mg by mouth every evening.    07/19/2015 at Unknown time    Assessment: 81 yo F presented to the ED with back pain.  She is on Coumadin PTA for hx DVT/PE.  Of note, the Coumadin was ordered at midnight last night and was not given in the ED.  It was given ~ 0700 this morning.  INR is 2.77.  Goal of Therapy:  INR 2-3 Monitor platelets by anticoagulation protocol: Yes   Plan:  Coumadin 1mg  PO x 1 tonight (Reduced dose since pt already had Coumadin this morning) Daily INR  [employer], Pharm.D., BCPS Clinical Pharmacist Pager [phone] 07/21/2015 1:00 PM

## 2015-07-21 NOTE — Progress Notes (Signed)
[facility]                                                                              Patient Demographics  [name] [name], is a 81 y.o. female, DOB - [birth year], UDJ:[id number]  Admit date - 07/20/2015   Admitting Physician [name] Polite, MD  Outpatient Primary MD for the patient is [name] Pel, MD  LOS -    Chief Complaint  Patient presents with  . Back Pain       Brief HPI   [name] [name] is a 81 y.o. 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 Patient reported that she was in her usual state of health until the morning of admission. She woke up early on the morning of admission and had sudden onset of low back pain which moved to her right side subsequently couldn't move or sit up. She lives with her son whom she called, he was unable to get her out of bed as well. Subsequently EMS was called and they presented to the emergency room. Patient denied any falls, or trauma, she denies any fevers or chills. She has intermittent back pain and takes Tylenol or low-dose tramadol for this, she ambulates using a walker. She did not do well in the emergency room tonight when ambulation was attempted, MRI LS spine, showed remote T12 and L3 fractures and degenerative canal stenosis but no acute findings. Per EDP, according to the son patient does not tolerate narcotics well.   Assessment & Plan    Principal Problem:   Intractable back pain - MRI of the back showed no acute finding, remote T12 and L3 superior endplate fractures, advanced degenerative canal stenosis L4-L5 - Currently pain is tolerated until she moves. We will continue pain control, attempt physical therapy. - Placed on higher dose of prednisone with taper and muscle relaxant. Usually takes prednisone 2 mg daily.  -  Continue tramadol as needed for pain  Active Problems:  CAD (coronary artery disease) -stable, continue aspirin, statin   H/o VTE (venous thromboembolism) -continue warfarin per pharmacy, therapeutic   PMR (polymyalgia rheumatica) -on very low dose Prednisone 2mg  daily, placed on prednisone with taper  Essential HTN -stable, continue losartan, amlodipine  Anxiety, dementia - Continue Aricept, doxepin, Ativan as needed  Hyperlipidemia Continue Lipitor  Code Status: Full code  Family Communication: Discussed in detail with the patient, all imaging results, lab results explained to the patient   Disposition Plan: Possibly DC tomorrow if pain is controlled and able to ambulate with physical therapy  Time Spent in minutes 25  minutes  Procedures  MRI of the back  Consults   None  DVT Prophylaxis  Coumadin  Medications  Scheduled Meds: . amLODipine  10 mg Oral q morning - 10a  . aspirin EC  81 mg Oral Daily  . atorvastatin  10 mg Oral QPM  . donepezil  10 mg Oral q morning - 10a  . doxepin  6 mg Oral QHS  .  losartan  50 mg Oral QHS  . mirabegron ER  25 mg Oral QPM  . predniSONE  2 mg Oral Q breakfast  . rOPINIRole  1 mg Oral TID  . sertraline  25 mg Oral Daily  . Warfarin - Pharmacist Dosing Inpatient   Does not apply q1800   Continuous Infusions:  PRN Meds:.acetaminophen, cyclobenzaprine, fluticasone, LORazepam, nitroGLYCERIN, ondansetron (ZOFRAN) IV, pantoprazole, traMADol   Antibiotics   Anti-infectives    None        Subjective:   [name] [name] was seen and examined today. Continues to complain of back pain when moving but tolerable when lying still. Patient denies dizziness, chest pain, shortness of breath, abdominal pain, N/V/D/C, new weakness, numbess, tingling. No acute events overnight.    Objective:   Blood pressure 151/66, pulse 61, temperature 97.7 F (36.5 C), temperature source Oral, resp. rate 17, SpO2 100 %.  Wt Readings from  Last 3 Encounters:  03/07/14 67.314 kg (148 lb 6.4 oz)  02/28/14 67.042 kg (147 lb 12.8 oz)  05/08/13 67.223 kg (148 lb 3.2 oz)    No intake or output data in the 24 hours ending 07/21/15 1024  Exam  General: Alert and oriented x 3, NAD  HEENT:  PERRLA, EOMI, Anicteric Sclera, mucous membranes moist.   Neck: Supple, no JVD, no masses  CVS: S1 S2 auscultated, no rubs, murmurs or gallops. Regular rate and rhythm.  Respiratory: Clear to auscultation bilaterally, no wheezing, rales or rhonchi  Abdomen: Soft, nontender, nondistended, + bowel sounds  Ext: no cyanosis clubbing or edema  Neuro: AAOx3, Cr N's II- XII. Strength 5/5 upper and lower extremities bilaterally  Skin: No rashes  Psych: Normal affect and demeanor, alert and oriented x3    Data Review   Micro Results No results found for this or any previous visit (from the past 240 hour(s)).  Radiology Reports Mr Lumbar Spine Wo Contrast  07/20/2015   CLINICAL DATA:  Low back pain with gait dysfunction, sudden onset after rolling over in bed.  EXAM: MRI LUMBAR SPINE WITHOUT CONTRAST  TECHNIQUE: Multiplanar, multisequence MR imaging of the lumbar spine was performed. No intravenous contrast was administered.  COMPARISON:  None.  FINDINGS: T12 and L3 superior endplate fractures are remote given there is no associated marrow edema (minimal fluid signal along the L3 superior endplate is likely degenerative). There is notable T2 hyperintensity within the L4-5 disc, but this is considered degenerative rather than infectious given vacuum phenomenon, history, and intact subchondral bone.  Normal conus signal and morphology.  Colonic diverticulosis.  At least 4 cm left ovarian cyst, which has enlarged since abdominal CT in 2008. On axial imaging, there may be septations superiorly. Mild ectasia of the infrarenal aorta.  Numerous renal cysts. 5 mm pancreatic body cyst, stable to minimally enlarged since 2009.  Degenerative changes:  L2-L3:  Mild disc bulging and ligament overgrowth. No herniation or stenosis.  L3-L4: Mild annulus bulging and ligament overgrowth. There is crowding of the subarticular recesses without neural compression.  L4-L5: Circumferential disc bulging with facet and ligament overgrowth causes moderate to advanced canal stenosis with minimal subarachnoid space remaining. Stenosis is greater on the left where there is subarticular recess impingement on L5. Patent foramina.  L5-S1:Facet arthropathy with mild bony and ligamentous overgrowth. Mild retrolisthesis. No herniation or impingement.  IMPRESSION: 1. No acute finding. 2. Remote T12 and L3 superior endplate fractures. 3. L4-5 moderate to advanced degenerative canal stenosis. 4. 4 cm partly septated left ovarian cyst which has  slowly enlarged since 2008 abdominal CT. If clinically warranted, this could be evaluated with outpatient pelvic sonography.   Electronically Signed   By: Monte Fantasia M.D.   On: 07/20/2015 20:17    CBC  Recent Labs Lab 07/20/15 1857 07/21/15 0455  WBC 8.2 7.5  HGB 12.2 11.5*  HCT 38.7 36.8  PLT 274 270  MCV 90.0 90.9  MCH 28.4 28.4  MCHC 31.5 31.3  RDW 14.5 14.5  LYMPHSABS 1.3  --   MONOABS 0.4  --   EOSABS 0.1  --   BASOSABS 0.0  --     Chemistries   Recent Labs Lab 07/20/15 1857 07/21/15 0455  NA 137 135  K 4.2 3.9  CL 108 106  CO2 22 22  GLUCOSE 107* 88  BUN 12 9  CREATININE 0.87 0.87  CALCIUM 9.1 8.6*   ------------------------------------------------------------------------------------------------------------------ CrCl cannot be calculated (Unknown ideal weight.). ------------------------------------------------------------------------------------------------------------------ No results for input(s): HGBA1C in the last 72 hours. ------------------------------------------------------------------------------------------------------------------ No results for input(s): CHOL, HDL, LDLCALC, TRIG, CHOLHDL,  LDLDIRECT in the last 72 hours. ------------------------------------------------------------------------------------------------------------------ No results for input(s): TSH, T4TOTAL, T3FREE, THYROIDAB in the last 72 hours.  Invalid input(s): FREET3 ------------------------------------------------------------------------------------------------------------------ No results for input(s): VITAMINB12, FOLATE, FERRITIN, TIBC, IRON, RETICCTPCT in the last 72 hours.  Coagulation profile  Recent Labs Lab 07/20/15 1857 07/21/15 0455  INR 3.08* 2.77*    No results for input(s): DDIMER in the last 72 hours.  Cardiac Enzymes No results for input(s): CKMB, TROPONINI, MYOGLOBIN in the last 168 hours.  Invalid input(s): CK ------------------------------------------------------------------------------------------------------------------ Invalid input(s): POCBNP  No results for input(s): GLUCAP in the last 72 hours.   Tomeko Scoville M.D. [facility] 07/21/2015, 10:24 AM  Pager: [phone] Between 7am to 7pm - call Pager - [phone]  After 7pm go to [email or web address] - password [email or web address]  Call night coverage person covering after 7pm

## 2015-07-22 DIAGNOSIS — M549 Dorsalgia, unspecified: Secondary | ICD-10-CM | POA: Diagnosis not present

## 2015-07-22 DIAGNOSIS — F0390 Unspecified dementia without behavioral disturbance: Secondary | ICD-10-CM | POA: Diagnosis not present

## 2015-07-22 DIAGNOSIS — M353 Polymyalgia rheumatica: Secondary | ICD-10-CM | POA: Diagnosis not present

## 2015-07-22 DIAGNOSIS — M545 Low back pain: Secondary | ICD-10-CM | POA: Diagnosis not present

## 2015-07-22 LAB — PROTIME-INR
INR: 2.55 — ABNORMAL HIGH (ref 0.00–1.49)
PROTHROMBIN TIME: 27.1 s — AB (ref 11.6–15.2)

## 2015-07-22 LAB — CBC
HCT: 36.3 % (ref 36.0–46.0)
Hemoglobin: 11.3 g/dL — ABNORMAL LOW (ref 12.0–15.0)
MCH: 27.8 pg (ref 26.0–34.0)
MCHC: 31.1 g/dL (ref 30.0–36.0)
MCV: 89.4 fL (ref 78.0–100.0)
PLATELETS: 275 10*3/uL (ref 150–400)
RBC: 4.06 MIL/uL (ref 3.87–5.11)
RDW: 14.5 % (ref 11.5–15.5)
WBC: 8.3 10*3/uL (ref 4.0–10.5)

## 2015-07-22 MED ORDER — CIPROFLOXACIN HCL 250 MG PO TABS: 250.0000 mg | ORAL_TABLET | Freq: Two times a day (BID) | ORAL | Status: DC

## 2015-07-22 MED ORDER — TRAMADOL HCL 50 MG PO TABS: 50.0000 mg | ORAL_TABLET | Freq: Four times a day (QID) | ORAL | Status: DC | PRN

## 2015-07-22 MED ORDER — LORAZEPAM 0.5 MG PO TABS: 0.2500 mg | ORAL_TABLET | Freq: Three times a day (TID) | ORAL | Status: DC | PRN

## 2015-07-22 MED ORDER — CIPROFLOXACIN HCL 500 MG PO TABS
250.0000 mg | ORAL_TABLET | Freq: Two times a day (BID) | ORAL | Status: DC
  Administered 2015-07-22: 250 mg via ORAL
  Filled 2015-07-22: qty 1

## 2015-07-22 MED ORDER — PREDNISONE 10 MG PO TABS: ORAL_TABLET | ORAL | Status: DC

## 2015-07-22 MED ORDER — PREDNISONE 1 MG PO TABS: 2.0000 mg | ORAL_TABLET | Freq: Every morning | ORAL | Status: DC

## 2015-07-22 MED ORDER — CYCLOBENZAPRINE HCL 5 MG PO TABS: 5.0000 mg | ORAL_TABLET | Freq: Three times a day (TID) | ORAL | Status: DC

## 2015-07-22 NOTE — Progress Notes (Signed)
Physical Therapy Treatment Patient Details Name: ROSALENA MCCORRY MRN: [id number] DOB: [birth year] Today's Date: 07/22/2015    History of Present Illness Back pain    PT Comments    Patient with improved mobility today but still requiring minimal assistance. Primary difficulty is with supine to sitting using logroll technique (min-mod assist). Sit-stand now with minimal assistance. Continue to recommend 24 hour supervision and physical assistance needed with transfers.   Follow Up Recommendations  Supervision/Assistance - 24 hour     Equipment Recommendations  None recommended by PT    Recommendations for Other Services       Precautions / Restrictions Precautions Precautions: Back Restrictions Weight Bearing Restrictions: No    Mobility  Bed Mobility Overal bed mobility: Needs Assistance Bed Mobility: Rolling;Sidelying to Sit Rolling: Min assist Sidelying to sit: Min assist       General bed mobility comments: cues needed for logroll  Transfers Overall transfer level: Needs assistance Equipment used: Rolling walker (2 wheeled) Transfers: Sit to/from Stand Sit to Stand: Min assist            Ambulation/Gait Ambulation/Gait assistance: Min guard Ambulation Distance (Feet): 125 Feet Assistive device: Rolling walker (2 wheeled) Gait Pattern/deviations: Step-through pattern Gait velocity: decreased   General Gait Details: no instability noted while using rw, occasional assistance to avoid objects near walker.    Stairs            Wheelchair Mobility    Modified Rankin (Stroke Patients Only)       Balance Overall balance assessment: Needs assistance Sitting-balance support: No upper extremity supported Sitting balance-Leahy Scale: Fair     Standing balance support: Bilateral upper extremity supported Standing balance-Leahy Scale: Poor Standing balance comment: using rw                    Cognition Arousal/Alertness:  Awake/alert Behavior During Therapy: WFL for tasks assessed/performed Overall Cognitive Status: No family/caregiver present to determine baseline cognitive functioning                      Exercises      General Comments        Pertinent Vitals/Pain Pain Assessment: 0-10 Pain Score: 2  Pain Location: back Pain Descriptors / Indicators: Sore Pain Intervention(s): Limited activity within patient's tolerance;Monitored during session    Home Living                      Prior Function            PT Goals (current goals can now be found in the care plan section) Acute Rehab PT Goals Patient Stated Goal: go home PT Goal Formulation: With patient Time For Goal Achievement: 08/04/15 Potential to Achieve Goals: Fair Progress towards PT goals: Progressing toward goals    Frequency  Min 3X/week    PT Plan Current plan remains appropriate    Co-evaluation             End of Session Equipment Utilized During Treatment: Gait belt Activity Tolerance: Patient tolerated treatment well Patient left: in chair;with call bell/phone within reach;with chair alarm set     Time: 6967-8938 PT Time Calculation (min) (ACUTE ONLY): 15 min  Charges:  $Gait Training: 8-22 mins                    G Codes:      Cassell Clement, PT, CSCS Pager [phone] Office [phone]  [phone]  07/22/2015, 12:20 PM

## 2015-07-22 NOTE — Discharge Summary (Signed)
Physician Discharge Summary   Patient ID: [name] [name] MRN: [id number] DOB/AGE: [birth year] 79 y.o.  Admit date: 07/20/2015 Discharge date: 07/22/2015  Primary Care Physician:  Horatio Pel, MD  Discharge Diagnoses:    . Intractable back pain . Dementia . PMR (polymyalgia rheumatica) . VTE (venous thromboembolism) . CAD (coronary artery disease)   Urinary tract infection  Consults:  None    Recommendations for Outpatient Follow-up:  Fall precautions, PT OT  Please continue prednisone 2 mg daily after patient has completed the prednisone taper  Follow urine culture and sensitivities  TESTS THAT NEED FOLLOW-UP PT INR 9/21   DIET: Heart healthy diet  Allergies:   Allergies  Allergen Reactions  . Ambien [Zolpidem Tartrate] Nausea And Vomiting  . Codeine Phosphate Nausea And Vomiting  . Darvocet [Propoxyphene N-Acetaminophen] Nausea And Vomiting  . Promethazine Hcl Nausea And Vomiting  . Sulfamethoxazole Other (See Comments)    Pt doesn't remember reaction  . Vicodin [Hydrocodone-Acetaminophen] Other (See Comments)    Unknown reaction  . Amoxicillin Nausea And Vomiting and Rash  . Penicillins Nausea And Vomiting and Rash     Discharge Medications:   Medication List    STOP taking these medications        benzonatate 200 MG capsule  Commonly known as:  TESSALON      TAKE these medications        acetaminophen 500 MG tablet  Commonly known as:  TYLENOL  Take 500 mg by mouth 3 (three) times daily as needed for moderate pain (NOT MORE THAN 3,000 MG IN 24 HOUR PERIOD). For pain     amLODipine 10 MG tablet  Commonly known as:  NORVASC  Take 10 mg by mouth every morning.     aspirin EC 81 MG tablet  Take 1 tablet (81 mg total) by mouth daily.     atorvastatin 20 MG tablet  Commonly known as:  LIPITOR  Take 10 mg by mouth every evening.     CHILDRENS CHEWABLE MULTI VITS PO  Take 1 tablet by mouth daily with lunch.     cholecalciferol 1000  UNITS tablet  Commonly known as:  VITAMIN D  Take 2,000 Units by mouth daily with lunch.     cholestyramine light 4 G packet  Commonly known as:  PREVALITE  Take 4 g by mouth daily as needed (for loose bowels).     ciprofloxacin 250 MG tablet  Commonly known as:  CIPRO  Take 1 tablet (250 mg total) by mouth 2 (two) times daily. X 3 days     cyclobenzaprine 5 MG tablet  Commonly known as:  FLEXERIL  Take 1 tablet (5 mg total) by mouth 3 (three) times daily.     donepezil 10 MG tablet  Commonly known as:  ARICEPT  Take 10 mg by mouth every morning.     Doxepin HCl 6 MG Tabs  Take 6 mg by mouth at bedtime.     ferrous sulfate 325 (65 FE) MG EC tablet  Take 325 mg by mouth daily with breakfast.     fluticasone 50 MCG/ACT nasal spray  Commonly known as:  FLONASE  Place 1 spray into both nostrils daily as needed for allergies or rhinitis.     ipratropium 0.03 % nasal spray  Commonly known as:  ATROVENT  Place 2 sprays into both nostrils 3 (three) times daily as needed for rhinitis.     loratadine 10 MG tablet  Commonly known as:  CLARITIN  Take  10 mg by mouth daily as needed for allergies.     LORazepam 0.5 MG tablet  Commonly known as:  ATIVAN  Take 0.5 tablets (0.25 mg total) by mouth every 8 (eight) hours as needed for anxiety or sleep.     losartan 100 MG tablet  Commonly known as:  COZAAR  Take 50 mg by mouth at bedtime.     mupirocin ointment 2 %  Commonly known as:  BACTROBAN  Apply 1 application topically daily with lunch.     MYRBETRIQ 25 MG Tb24 tablet  Generic drug:  mirabegron ER  Take 25 mg by mouth every evening.     nitroGLYCERIN 0.4 MG SL tablet  Commonly known as:  NITROSTAT  Place 0.4 mg under the tongue every 5 (five) minutes as needed for chest pain.     pantoprazole 40 MG tablet  Commonly known as:  PROTONIX  Take 40 mg by mouth 2 (two) times daily as needed (for heartburn).     potassium chloride SA 20 MEQ tablet  Commonly known as:   K-DUR,KLOR-CON  Take 20 mEq by mouth daily with lunch.     predniSONE 1 MG tablet  Commonly known as:  DELTASONE  Take 2 tablets (2 mg total) by mouth every morning. Restart after completing the prednisone taper     predniSONE 10 MG tablet  Commonly known as:  DELTASONE  Prednisone dosing: Take  Prednisone 40mg  (4 tabs) x 2 days, then taper to 30mg  (3 tabs) x 3 days, then 20mg  (2 tabs) x 3days, then 10mg  (1 tab) x 3days, then resume your maintenance home dose   Dispense:  30 tabs, refills: None     rOPINIRole 1 MG tablet  Commonly known as:  REQUIP  Take 1 mg by mouth 3 (three) times daily. 1mg  at noon, 1mg  2 hours before bed, then 1mg  at bedtime     sertraline 25 MG tablet  Commonly known as:  ZOLOFT  Take 25 mg by mouth daily.     traMADol 50 MG tablet  Commonly known as:  ULTRAM  Take 1 tablet (50 mg total) by mouth every 6 (six) hours as needed for severe pain.     warfarin 2.5 MG tablet  Commonly known as:  COUMADIN  Take 2.5 mg by mouth every evening.         Brief H and P: For complete details please refer to admission H and P, but in brief [name] [name] is a 81 y.o. 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 Patient reported that she was in her usual state of health until the morning of admission. She woke up early on the morning of admission and had sudden onset of low back pain which moved to her right side subsequently couldn't move or sit up. She lives with her son whom she called, he was unable to get her out of bed as well. Subsequently EMS was called and they presented to the emergency room. Patient denied any falls, or trauma, she denies any fevers or chills. She has intermittent back pain and takes Tylenol or low-dose tramadol for this, she ambulates using a walker. She did not do well in  the emergency room tonight when ambulation was attempted, MRI LS spine, showed remote T12 and L3 fractures and degenerative canal stenosis but  no acute findings. Per EDP, according to the son patient does not tolerate narcotics well.  Hospital Course:  Intractable back pain - MRI of the back showed no acute finding, remote T12 and L3 superior endplate fractures, advanced degenerative canal stenosis L4-L5 Patient was placed on pain control with tramadol, prednisone with taper and muscle relaxant. Usually she takes prednisone 2 mg daily for PMR, which will need to be started once the tapered prednisone is completed. Patient is working well with physical therapy, likely needs  short-term rehab.  CAD (coronary artery disease) -stable, continue aspirin, statin   H/o VTE (venous thromboembolism) -continue warfarin , INR 2.55  Urinary tract infection - Urine culture results pending, patient has allergy to penicillin, hence placed on ciprofloxacin 250 mg twice a day for 3 days. Follow urine culture results.   PMR (polymyalgia rheumatica) -on very low dose Prednisone 2mg  daily to be restarted once prednisone with taper is completed  Essential HTN -stable, continue losartan, amlodipine  Anxiety, dementia - Continue Aricept, doxepin, Ativan as needed  Hyperlipidemia Continue Lipitor   Day of Discharge BP 123/50 mmHg  Pulse 65  Temp(Src) 98.4 F (36.9 C) (Oral)  Resp 18  SpO2 98%  Physical Exam: General: Alert and awake oriented x3 not in any acute distress. HEENT: anicteric sclera, pupils reactive to light and accommodation CVS: S1-S2 clear no murmur rubs or gallops Chest: clear to auscultation bilaterally, no wheezing rales or rhonchi Abdomen: soft nontender, nondistended, normal bowel sounds Extremities: no cyanosis, clubbing or edema noted bilaterally Neuro: Cranial nerves II-XII intact, no focal neurological deficits   The results of significant diagnostics from this  hospitalization (including imaging, microbiology, ancillary and laboratory) are listed below for reference.    LAB RESULTS: Basic Metabolic Panel:  Recent Labs Lab 07/20/15 1857 07/21/15 0455  NA 137 135  K 4.2 3.9  CL 108 106  CO2 22 22  GLUCOSE 107* 88  BUN 12 9  CREATININE 0.87 0.87  CALCIUM 9.1 8.6*   Liver Function Tests: No results for input(s): AST, ALT, ALKPHOS, BILITOT, PROT, ALBUMIN in the last 168 hours. No results for input(s): LIPASE, AMYLASE in the last 168 hours. No results for input(s): AMMONIA in the last 168 hours. CBC:  Recent Labs Lab 07/20/15 1857 07/21/15 0455 07/22/15 0555  WBC 8.2 7.5 8.3  NEUTROABS 6.4  --   --   HGB 12.2 11.5* 11.3*  HCT 38.7 36.8 36.3  MCV 90.0 90.9 89.4  PLT 274 270 275   Cardiac Enzymes: No results for input(s): CKTOTAL, CKMB, CKMBINDEX, TROPONINI in the last 168 hours. BNP: Invalid input(s): POCBNP CBG: No results for input(s): GLUCAP in the last 168 hours.  Significant Diagnostic Studies:  Mr Lumbar Spine Wo Contrast  07/20/2015   CLINICAL DATA:  Low back pain with gait dysfunction, sudden onset after rolling over in bed.  EXAM: MRI LUMBAR SPINE WITHOUT CONTRAST  TECHNIQUE: Multiplanar, multisequence MR imaging of the lumbar spine was performed. No intravenous contrast was administered.  COMPARISON:  None.  FINDINGS: T12 and L3 superior endplate fractures are remote given there is no associated marrow edema (minimal fluid signal along the L3 superior endplate is likely degenerative). There is notable T2 hyperintensity within the L4-5 disc, but this is considered degenerative rather than infectious given vacuum phenomenon, history, and intact subchondral bone.  Normal conus signal and morphology.  Colonic diverticulosis.  At least 4 cm left ovarian cyst, which has enlarged since abdominal CT in 2008. On axial imaging, there may  be septations superiorly. Mild ectasia of the infrarenal aorta.  Numerous renal cysts. 5 mm  pancreatic body cyst, stable to minimally enlarged since 2009.  Degenerative changes:  L2-L3: Mild disc bulging and ligament overgrowth. No herniation or stenosis.  L3-L4: Mild annulus bulging and ligament overgrowth. There is crowding of the subarticular recesses without neural compression.  L4-L5: Circumferential disc bulging with facet and ligament overgrowth causes moderate to advanced canal stenosis with minimal subarachnoid space remaining. Stenosis is greater on the left where there is subarticular recess impingement on L5. Patent foramina.  L5-S1:Facet arthropathy with mild bony and ligamentous overgrowth. Mild retrolisthesis. No herniation or impingement.  IMPRESSION: 1. No acute finding. 2. Remote T12 and L3 superior endplate fractures. 3. L4-5 moderate to advanced degenerative canal stenosis. 4. 4 cm partly septated left ovarian cyst which has slowly enlarged since 2008 abdominal CT. If clinically warranted, this could be evaluated with outpatient pelvic sonography.   Electronically Signed   By: Monte Fantasia M.D.   On: 07/20/2015 20:17    2D ECHO:   Disposition and Follow-up:     Discharge Instructions    Diet - low sodium heart healthy    Complete by:  As directed      Increase activity slowly    Complete by:  As directed             DISPOSITION: STR   DISCHARGE FOLLOW-UP Follow-up Information    Follow up with Horatio Pel, MD. Schedule an appointment as soon as possible for a visit in 2 weeks.   Specialty:  Internal Medicine   Why:  for hospital follow-up   Contact information:   [address] [place] [address] [phone]        Time spent on Discharge: 35 minutes  Signed:   RAI,RIPUDEEP M.D. [facility] 07/22/2015, 12:15 PM Pager: [phone]

## 2015-07-22 NOTE — Clinical Social Work Note (Signed)
Clinical Social Work Assessment  Patient Details  Name: [name] [name] MRN: [id number] Date of Birth: [birth year]  Date of referral:  07/22/15               Reason for consult:  Facility Placement, Discharge Planning                Permission sought to share information with:  Chartered certified accountant granted to share information::  Yes, Verbal Permission Granted  Name::        Agency::  [name]  Relationship::     Contact Information:     Housing/Transportation Living arrangements for the past 2 months:  Single Family Home Source of Information:  Patient Patient Interpreter Needed:  None Criminal Activity/Legal Involvement Pertinent to Current Situation/Hospitalization:  No - Comment as needed Significant Relationships:  Adult Children Lives with:  Adult Children Do you feel safe going back to the place where you live?  No (High fall risk.) Need for family participation in patient care:  No (Coment) (Patient able to make own decisions.)  Care giving concerns:  Patient expressed no concerns at this time.   Social Worker assessment / plan:  CSW received referral for possible SNF placement at time of discharge. CSW met with patient to discuss discharge disposition. Per patient, patient has previously completed short-term rehabilitation at [facility] and would prefer to return at time of discharge. CSW to continue to follow and assist with discharge planning needs.  Employment status:  Retired Forensic scientist:  [employer]) PT Recommendations:  [place] / Referral to community resources:  [place]  Patient/Family's Response to care:  Patient understanding and agreeable to CSW plan of care.  Patient/Family's Understanding of and Emotional Response to Diagnosis, Current Treatment, and Prognosis:  Patient understanding and agreeable to CSW plan of care.  Emotional Assessment Appearance:   Appears stated age Attitude/Demeanor/Rapport:  Other (Pleasant.) Affect (typically observed):  Accepting, Appropriate, Pleasant Orientation:  Oriented to Self, Oriented to Place, Oriented to  Time, Oriented to Situation Alcohol / Substance use:  Not Applicable Psych involvement (Current and /or in the community):  No (Comment) (Not appropriate on this admission.)  Discharge Needs  Concerns to be addressed:  No discharge needs identified Readmission within the last 30 days:  No Current discharge risk:  None Barriers to Discharge:  No Barriers Identified   Caroline Sauger, LCSW 07/22/2015, 12:38 PM [phone]

## 2015-07-22 NOTE — Progress Notes (Signed)
Report called to [facility] at [facility]  [phone]. Pt left on stretcher via Ambulance. Ambulance did not notify RN or Network engineer about pt departure. Pt left 15:00?

## 2015-07-22 NOTE — Clinical Social Work Placement (Signed)
   CLINICAL SOCIAL WORK PLACEMENT  NOTE  Date:  07/22/2015  Patient Details  Name: [name] [name] MRN: [id number] Date of Birth: 18-Jan-1921  Clinical Social Work is seeking post-discharge placement for this patient at the [place] level of care (*CSW will initial, date and re-position this form in  chart as items are completed):  Yes   Patient/family provided with [place] Work Department's list of facilities offering this level of care within the geographic area requested by the patient (or if unable, by the patient's family).  Yes   Patient/family informed of their freedom to choose among providers that offer the needed level of care, that participate in Medicare, Medicaid or managed care program needed by the patient, have an available bed and are willing to accept the patient.  Yes   Patient/family informed of [facility]'s ownership interest in [facility] and [facility], as well as of the fact that they are under no obligation to receive care at these facilities.  PASRR submitted to EDS on  (n/a)     PASRR number received on  (n/a)     Existing PASRR number confirmed on 07/22/15     FL2 transmitted to all facilities in geographic area requested by pt/family on 07/22/15     FL2 transmitted to all facilities within larger geographic area on  (n/a)     Patient informed that his/her managed care company has contracts with or will negotiate with certain facilities, including the following:   (yes, [place])     Yes   Patient/family informed of bed offers received.  Patient chooses bed at [facility]     Physician recommends and patient chooses bed at      Patient to be transferred to [facility] on 07/22/15.  Patient to be transferred to facility by [facility]     Patient family notified on 07/22/15 of transfer.  Name of family member notified:  Patient updated at bedside.     PHYSICIAN        Additional Comment:    _______________________________________________ Caroline Sauger, LCSW 07/22/2015, 12:41 PM

## 2015-07-22 NOTE — Discharge Planning (Signed)
Patient to be discharged to [facility]. Patient updated at bedside.  [facility] Medicare authorization received on 07/22/2015  Facility: [facility] RN report number: [phone] Transportation: EMS  Lubertha Sayres, [place] [phone]) and Surgical [phone])

## 2015-07-23 ENCOUNTER — Non-Acute Institutional Stay (SKILLED_NURSING_FACILITY): Payer: Medicare Other | Admitting: Internal Medicine

## 2015-07-23 DIAGNOSIS — I2510 Atherosclerotic heart disease of native coronary artery without angina pectoris: Secondary | ICD-10-CM | POA: Diagnosis not present

## 2015-07-23 DIAGNOSIS — M353 Polymyalgia rheumatica: Secondary | ICD-10-CM

## 2015-07-23 DIAGNOSIS — E785 Hyperlipidemia, unspecified: Secondary | ICD-10-CM | POA: Diagnosis not present

## 2015-07-23 DIAGNOSIS — I8290 Acute embolism and thrombosis of unspecified vein: Secondary | ICD-10-CM | POA: Diagnosis not present

## 2015-07-23 DIAGNOSIS — I10 Essential (primary) hypertension: Secondary | ICD-10-CM | POA: Diagnosis not present

## 2015-07-23 DIAGNOSIS — M549 Dorsalgia, unspecified: Secondary | ICD-10-CM

## 2015-07-23 DIAGNOSIS — N3000 Acute cystitis without hematuria: Secondary | ICD-10-CM | POA: Diagnosis not present

## 2015-07-23 DIAGNOSIS — F0390 Unspecified dementia without behavioral disturbance: Secondary | ICD-10-CM

## 2015-07-23 NOTE — Progress Notes (Signed)
MRN: [id number] Name: [name] [name]  Sex: female Age: 81 y.o. DOB: May 07, 1921  [facility] [place] #: [facility] Facility/Room:515 Level Of Care: SNF Provider: Inocencio Homes D Emergency Contacts: Extended Emergency Contact Information Primary Emergency Contact: Thielen,[name] Address: [place], [place] [address] [place] Phone: [phone] Mobile Phone: [phone] Relation: Son Secondary Emergency Contact: [place] Phone: [phone] Relation: Daughter  Code Status:   Allergies: Ambien; Codeine phosphate; Darvocet; Promethazine hcl; Sulfamethoxazole; Vicodin; Amoxicillin; and Penicillins  Chief Complaint  Patient presents with  . New Admit To SNF    HPI: Patient is 81 y.o. 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 Pt was admitted to hospital from 9/18-20 with inability to ambulate and no new MRI findings. Stay complicated by presumed UTI and abx were started. Pt is admitted to SNF for OT/PT. While at SNF pt will be followed forHTN, tx with norvasc and losartan, dementia, tx with aricept and HLD, tx with lipitor.  Past Medical History  Diagnosis Date  . CAD (coronary artery disease)     S/p PTCA / stenting (last cath 2004, multiple LAD stents, 2 stents in the right coronary artery all patent)  . 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 Started coumadin  . HTN (hypertension)   . Restless leg syndrome   . PMR (polymyalgia rheumatica)   . Axillary abscess   . Anxiety   . Dementia   . Complication of anesthesia   . PONV (postoperative nausea and vomiting)   . GERD (gastroesophageal reflux disease)   . Cancer     Past Surgical History  Procedure Laterality Date  . Colectomy    . Stent      cardiac x 8 stents.  . Incision and drainage       bilateral axillary, non specific staff  . Incision and drainage abscess  09/28/2012    Procedure: INCISION AND DRAINAGE ABSCESS;  Surgeon: Zenovia Jarred, MD;  Location: [facility];  Service: General;  Laterality: Bilateral;  . Esophagogastroduodenoscopy (egd) with esophageal dilation  10/10/2012    Procedure: ESOPHAGOGASTRODUODENOSCOPY (EGD) WITH ESOPHAGEAL DILATION;  Surgeon: Inda Castle, MD;  Location: [facility];  Service: Endoscopy;  Laterality: N/A;  . Breast lumpectomy    . Heart stents  x 8      Medication List       This list is accurate as of: 07/23/15 11:59 PM.  Always use your most recent med list.               acetaminophen 500 MG tablet  Commonly known as:  TYLENOL  Take 500 mg by mouth 3 (three) times daily as needed for moderate pain (NOT MORE THAN 3,000 MG IN 24 HOUR PERIOD). For pain     amLODipine 10 MG tablet  Commonly known as:  NORVASC  Take 10 mg by mouth every morning.     aspirin EC 81 MG tablet  Take 1 tablet (81 mg total) by mouth daily.     atorvastatin 20 MG tablet  Commonly known as:  LIPITOR  Take 10 mg by mouth every evening.     CHILDRENS CHEWABLE MULTI VITS PO  Take 1 tablet by mouth daily with lunch.     cholecalciferol  1000 UNITS tablet  Commonly known as:  VITAMIN D  Take 2,000 Units by mouth daily with lunch.     cholestyramine light 4 G packet  Commonly known as:  PREVALITE  Take 4 g by mouth daily as needed (for loose bowels).     ciprofloxacin 250 MG tablet  Commonly known as:  CIPRO  Take 1 tablet (250 mg total) by mouth 2 (two) times daily. X 3 days     cyclobenzaprine 5 MG tablet  Commonly known as:  FLEXERIL  Take 1 tablet (5 mg total) by mouth 3 (three) times daily.     donepezil 10 MG tablet  Commonly known as:  ARICEPT  Take 10 mg by mouth every morning.     Doxepin HCl 6 MG Tabs  Take 6 mg by mouth at bedtime.     ferrous sulfate 325 (65 FE) MG EC tablet  Take 325 mg by mouth daily with breakfast.      fluticasone 50 MCG/ACT nasal spray  Commonly known as:  FLONASE  Place 1 spray into both nostrils daily as needed for allergies or rhinitis.     ipratropium 0.03 % nasal spray  Commonly known as:  ATROVENT  Place 2 sprays into both nostrils 3 (three) times daily as needed for rhinitis.     loratadine 10 MG tablet  Commonly known as:  CLARITIN  Take 10 mg by mouth daily as needed for allergies.     LORazepam 0.5 MG tablet  Commonly known as:  ATIVAN  Take 0.5 tablets (0.25 mg total) by mouth every 8 (eight) hours as needed for anxiety or sleep.     losartan 100 MG tablet  Commonly known as:  COZAAR  Take 50 mg by mouth at bedtime.     mupirocin ointment 2 %  Commonly known as:  BACTROBAN  Apply 1 application topically daily with lunch.     MYRBETRIQ 25 MG Tb24 tablet  Generic drug:  mirabegron ER  Take 25 mg by mouth every evening.     nitroGLYCERIN 0.4 MG SL tablet  Commonly known as:  NITROSTAT  Place 0.4 mg under the tongue every 5 (five) minutes as needed for chest pain.     pantoprazole 40 MG tablet  Commonly known as:  PROTONIX  Take 40 mg by mouth 2 (two) times daily as needed (for heartburn).     potassium chloride SA 20 MEQ tablet  Commonly known as:  K-DUR,KLOR-CON  Take 20 mEq by mouth daily with lunch.     predniSONE 1 MG tablet  Commonly known as:  DELTASONE  Take 2 tablets (2 mg total) by mouth every morning. Restart after completing the prednisone taper     predniSONE 10 MG tablet  Commonly known as:  DELTASONE  Prednisone dosing: Take  Prednisone 40mg  (4 tabs) x 2 days, then taper to 30mg  (3 tabs) x 3 days, then 20mg  (2 tabs) x 3days, then 10mg  (1 tab) x 3days, then resume your maintenance home dose   Dispense:  30 tabs, refills: None     rOPINIRole 1 MG tablet  Commonly known as:  REQUIP  Take 1 mg by mouth 3 (three) times daily. 1mg  at noon, 1mg  2 hours before bed, then 1mg  at bedtime     sertraline 25 MG tablet  Commonly known as:  ZOLOFT   Take 25 mg by mouth daily.     traMADol 50 MG tablet  Commonly known as:  ULTRAM  Take 1 tablet (  50 mg total) by mouth every 6 (six) hours as needed for severe pain.     warfarin 2.5 MG tablet  Commonly known as:  COUMADIN  Take 2.5 mg by mouth every evening.        No orders of the defined types were placed in this encounter.     There is no immunization history on file for this patient.  Social History  Substance Use Topics  . Smoking status: Never Smoker   . Smokeless tobacco: Never Used  . Alcohol Use: No    Family history is + dementia   Review of Systems  DATA OBTAINED: from patient, nurse GENERAL:  no fevers, fatigue, appetite changes SKIN: No itching, rash or wounds EYES: No eye pain, redness, discharge EARS: No earache, tinnitus, change in hearing NOSE: No congestion, drainage or bleeding  MOUTH/THROAT: No mouth or tooth pain, No sore throat RESPIRATORY: No cough, wheezing, SOB CARDIAC: No chest pain, palpitations, lower extremity edema  GI: No abdominal pain, No N/V/D or constipation, No heartburn or reflux  GU: No dysuria, frequency or urgency, or incontinence  MUSCULOSKELETAL: back pain improving NEUROLOGIC: No headache, dizziness or focal weakness PSYCHIATRIC: No c/o anxiety or sadness   Filed Vitals:   07/26/15 1612  BP: 123/67  Pulse: 69  Temp: 98 F (36.7 C)  Resp: 18    SpO2 Readings from Last 1 Encounters:  07/22/15 98%        Physical Exam  GENERAL APPEARANCE: Alert, conversant,  No acute distress.  SKIN: No diaphoresis rash HEAD: Normocephalic, atraumatic  EYES: Conjunctiva/lids clear. Pupils round, reactive. EOMs intact.  EARS: External exam WNL, canals clear. Hearing grossly normal.  NOSE: No deformity or discharge.  MOUTH/THROAT: Lips w/o lesions  RESPIRATORY: Breathing is even, unlabored. Lung sounds are clear   CARDIOVASCULAR: Heart RRR no murmurs, rubs or gallops. No peripheral edema.   GASTROINTESTINAL: Abdomen is  soft, non-tender, not distended w/ normal bowel sounds. GENITOURINARY: Bladder non tender, not distended  MUSCULOSKELETAL: No abnormal joints or musculature NEUROLOGIC:  Cranial nerves 2-12 grossly intact. Moves all extremities  PSYCHIATRIC: Mood and affect appropriate to situation with dementia, no behavioral issues  Patient Active Problem List   Diagnosis Date Noted  . Intractable back pain 07/20/2015  . Back pain 07/20/2015  . Cellulitis and abscess of left buttock 04/12/2013  . Stricture and stenosis of esophagus 10/10/2012  . Dementia 10/06/2012  . Dysphagia 10/06/2012  . Axillary abscess - bilateral, multiple 09/27/2012  . Hypotension 08/24/2012  . Near syncope 08/24/2012  . Abscess of axilla, left 06/27/2012  . Bradycardia 06/13/2012  . Abscess 06/11/2012  . Chest pain 06/09/2012  . Cellulitis 06/09/2012  . Weakness of both legs 06/09/2012  . PMR (polymyalgia rheumatica)   . Restless leg syndrome   . Angina pectoris, unstable 03/02/2012  . Anemia 03/02/2012  . Dyspnea 02/03/2012  . UTI (urinary tract infection) 02/03/2012  . CAD (coronary artery disease)   . HTN (hypertension)   . VTE (venous thromboembolism) 10/01/2010  . HYPERCHOLESTEROLEMIA  IIA 08/27/2009  . Hyperlipidemia 12/11/2008  . HYPERTENSION, BENIGN 12/11/2008    CBC    Component Value Date/Time   WBC 8.3 07/22/2015 0555   WBC 9.7 04/10/2008 1100   RBC 4.06 07/22/2015 0555   RBC 4.76 04/10/2008 1100   HGB 11.3* 07/22/2015 0555   HGB 13.8 04/10/2008 1100   HCT 36.3 07/22/2015 0555   HCT 41.2 04/10/2008 1100   PLT 275 07/22/2015 0555   PLT 261 04/10/2008  1100   MCV 89.4 07/22/2015 0555   MCV 86.6 04/10/2008 1100   LYMPHSABS 1.3 07/20/2015 1857   LYMPHSABS 1.7 04/10/2008 1100   MONOABS 0.4 07/20/2015 1857   MONOABS 0.6 04/10/2008 1100   EOSABS 0.1 07/20/2015 1857   EOSABS 0.1 04/10/2008 1100   BASOSABS 0.0 07/20/2015 1857   BASOSABS 0.0 04/10/2008 1100    CMP     Component Value  Date/Time   NA 135 07/21/2015 0455   K 3.9 07/21/2015 0455   CL 106 07/21/2015 0455   CO2 22 07/21/2015 0455   GLUCOSE 88 07/21/2015 0455   BUN 9 07/21/2015 0455   CREATININE 0.87 07/21/2015 0455   CALCIUM 8.6* 07/21/2015 0455   PROT 6.2 02/28/2014 1050   ALBUMIN 3.2* 02/28/2014 1050   AST 16 02/28/2014 1050   ALT 11 02/28/2014 1050   ALKPHOS 41 02/28/2014 1050   BILITOT 0.3 02/28/2014 1050   GFRNONAA 57* 07/21/2015 0455   GFRAA >60 07/21/2015 0455    No results found for: HGBA1C   Mr Lumbar Spine Wo Contrast  07/20/2015   CLINICAL DATA:  Low back pain with gait dysfunction, sudden onset after rolling over in bed.  EXAM: MRI LUMBAR SPINE WITHOUT CONTRAST  TECHNIQUE: Multiplanar, multisequence MR imaging of the lumbar spine was performed. No intravenous contrast was administered.  COMPARISON:  None.  FINDINGS: T12 and L3 superior endplate fractures are remote given there is no associated marrow edema (minimal fluid signal along the L3 superior endplate is likely degenerative). There is notable T2 hyperintensity within the L4-5 disc, but this is considered degenerative rather than infectious given vacuum phenomenon, history, and intact subchondral bone.  Normal conus signal and morphology.  Colonic diverticulosis.  At least 4 cm left ovarian cyst, which has enlarged since abdominal CT in 2008. On axial imaging, there may be septations superiorly. Mild ectasia of the infrarenal aorta.  Numerous renal cysts. 5 mm pancreatic body cyst, stable to minimally enlarged since 2009.  Degenerative changes:  L2-L3: Mild disc bulging and ligament overgrowth. No herniation or stenosis.  L3-L4: Mild annulus bulging and ligament overgrowth. There is crowding of the subarticular recesses without neural compression.  L4-L5: Circumferential disc bulging with facet and ligament overgrowth causes moderate to advanced canal stenosis with minimal subarachnoid space remaining. Stenosis is greater on the left where there  is subarticular recess impingement on L5. Patent foramina.  L5-S1:Facet arthropathy with mild bony and ligamentous overgrowth. Mild retrolisthesis. No herniation or impingement.  IMPRESSION: 1. No acute finding. 2. Remote T12 and L3 superior endplate fractures. 3. L4-5 moderate to advanced degenerative canal stenosis. 4. 4 cm partly septated left ovarian cyst which has slowly enlarged since 2008 abdominal CT. If clinically warranted, this could be evaluated with outpatient pelvic sonography.   Electronically Signed   By: Monte Fantasia M.D.   On: 07/20/2015 20:17    Not all labs, radiology exams or other studies done during hospitalization come through on my EPIC note; however they are reviewed by me.    Assessment and Plan  Intractable back pain MRI of the back showed no acute finding, remote T12 and L3 superior endplate fractures, advanced degenerative canal stenosis L4-L5 Patient was placed on pain control with tramadol, prednisone with taper and muscle relaxant. SNF - prednisone 2 mg daily for PMR, to bee started once the tapered prednisone is completed : OT/PT  CAD (coronary artery disease) -stable; SNF -  continue aspirin, statin  VTE (venous thromboembolism) SNF - continue warfarin ,  INR 2.55 on d/c  UTI (urinary tract infection) patient has allergy to penicillin, hence placed on ciprofloxacin 250 mg twice a day for 3 days.SNF - Cx > 100,000 E coli and klebsiella sensitive to Cipro; will continue cipro for 5 more days  PMR (polymyalgia rheumatica) SNF - cont prednisone 2 mg daily once taper is complete  Hyperlipidemia SNF - cont lipitor  Dementia SNF - stable, cont aricept  HYPERTENSION, BENIGN Stable ;SNF - cont cozaar and norvasc   Time spent 35 min;> 50% of time with patient was spent reviewing records, labs, tests and studies, counseling and developing plan of care  Hennie Duos, MD

## 2015-07-26 ENCOUNTER — Encounter: Payer: Self-pay | Admitting: Internal Medicine

## 2015-07-26 LAB — URINE CULTURE

## 2015-07-26 NOTE — Assessment & Plan Note (Signed)
-  stable; SNF -  continue aspirin, statin

## 2015-07-26 NOTE — Assessment & Plan Note (Signed)
SNF - continue warfarin , INR 2.55 on d/c

## 2015-07-26 NOTE — Assessment & Plan Note (Signed)
SNF - cont prednisone 2 mg daily once taper is complete

## 2015-07-26 NOTE — Assessment & Plan Note (Signed)
patient has allergy to penicillin, hence placed on ciprofloxacin 250 mg twice a day for 3 days.SNF - Cx > 100,000 E coli and klebsiella sensitive to Cipro; will continue cipro for 5 more days

## 2015-07-26 NOTE — Assessment & Plan Note (Signed)
MRI of the back showed no acute finding, remote T12 and L3 superior endplate fractures, advanced degenerative canal stenosis L4-L5 Patient was placed on pain control with tramadol, prednisone with taper and muscle relaxant. SNF - prednisone 2 mg daily for PMR, to bee started once the tapered prednisone is completed : OT/PT

## 2015-07-26 NOTE — Assessment & Plan Note (Signed)
SNF - stable, cont aricept

## 2015-07-26 NOTE — Assessment & Plan Note (Signed)
SNF - cont lipitor

## 2015-07-26 NOTE — Assessment & Plan Note (Signed)
Stable ;SNF - cont cozaar and norvasc

## 2015-07-28 ENCOUNTER — Encounter: Payer: Self-pay | Admitting: Internal Medicine

## 2015-07-28 ENCOUNTER — Non-Acute Institutional Stay (SKILLED_NURSING_FACILITY): Payer: Medicare Other | Admitting: Internal Medicine

## 2015-07-28 DIAGNOSIS — N10 Acute tubulo-interstitial nephritis: Secondary | ICD-10-CM

## 2015-07-28 DIAGNOSIS — J209 Acute bronchitis, unspecified: Secondary | ICD-10-CM

## 2015-07-28 DIAGNOSIS — Z7901 Long term (current) use of anticoagulants: Secondary | ICD-10-CM

## 2015-07-28 NOTE — Progress Notes (Signed)
Patient ID: [name] [name], female   DOB: September 14, 1921, 81 y.o.   MRN: [id number] MRN: [id number] Name: [name] [name]  Sex: female Age: 81 y.o. DOB: [birth year]  [facility] #: [facility] Facility/Room:515 Level Of Care: SNF Provider: Wille Celeste Emergency Contacts: Extended Emergency Contact Information Primary Emergency Contact: Mcphearson,[name] Address: [place], [place] [address] [place] Phone: [phone] Mobile Phone: [phone] Relation: Son Secondary Emergency Contact: [place] Phone: [phone] Relation: Daughter  Code Status:   Allergies: Ambien; Codeine phosphate; Darvocet; Promethazine hcl; Sulfamethoxazole; Vicodin; Amoxicillin; and Penicillins  Chief Complaint  Patient presents with  . 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 Pt was admitted to hospital from 9/18-20 with inability to ambulate and no new MRI findings. Stay complicated by presumed UTI and abx were started. Pt was admitted to SNF for OT/PT. While at [facility] e followed forHTN, tx with norvasc and losartan, dementia, tx with aricept and HLD, tx with lipitor. Over the weekend apparently she developed a cough with some possible chest congestion-x-ray showed bronchitis bilateral-she is on duo nebs as needed every 6 hours Robitussin is also been started-she was started empirically on doxycycline for bronchitis this is complicated with her history of numerous antibiotic allergies and being on Coumadin.  I Today I notew she is also on Cipro for UTI multiple organisms including Escherichia coli and Klebsiella-I have reviewed the cultures and  Cipro is effective against both of these she will be on this for a couple more days  Do not believe she will need the doxycycline since she is already on Cipro--this was discussed with Dr. [name] via phone.   She is on Coumadin which was recently increased to 3 mg a day secondary to a subtherapeutic INR of 1.5 on September 23 however this has risen fairly rapidly to 2.5 today-   Past Medical History  Diagnosis Date  . CAD (coronary artery disease)     S/p PTCA / stenting (last cath 2004, multiple LAD stents, 2 stents in the right coronary artery all patent)  . 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 Started coumadin  . HTN (hypertension)   . Restless leg syndrome   . PMR (polymyalgia rheumatica)   . Axillary abscess   . Anxiety   . Dementia   . Complication of anesthesia   . PONV (postoperative nausea and vomiting)   . GERD (gastroesophageal reflux disease)   . Cancer     Past Surgical History  Procedure Laterality Date  . Colectomy    . Stent      cardiac x 8 stents.  . Incision and drainage      bilateral axillary, non specific staff  . Incision and drainage abscess  09/28/2012    Procedure: INCISION AND DRAINAGE ABSCESS;  Surgeon: Zenovia Jarred, MD;  Location: [name];  Service: General;  Laterality: Bilateral;  . Esophagogastroduodenoscopy (egd) with esophageal dilation  10/10/2012    Procedure: ESOPHAGOGASTRODUODENOSCOPY (EGD) WITH ESOPHAGEAL DILATION;  Surgeon: Inda Castle, MD;  Location: [facility];  Service: Endoscopy;  Laterality: N/A;  . Breast lumpectomy    .  Heart stents  x 8      Medication List       This list is accurate as of: 07/28/15 10:32 PM.  Always use your most recent med list.               acetaminophen 500 MG tablet  Commonly known as:  TYLENOL  Take 500 mg by mouth 3 (three) times daily as needed for moderate pain (NOT MORE THAN 3,000 MG IN 24 HOUR PERIOD). For pain     amLODipine 10 MG tablet  Commonly known as:   NORVASC  Take 10 mg by mouth every morning.     aspirin EC 81 MG tablet  Take 1 tablet (81 mg total) by mouth daily.     atorvastatin 20 MG tablet  Commonly known as:  LIPITOR  Take 10 mg by mouth every evening.     CHILDRENS CHEWABLE MULTI VITS PO  Take 1 tablet by mouth daily with lunch.     cholecalciferol 1000 UNITS tablet  Commonly known as:  VITAMIN D  Take 2,000 Units by mouth daily with lunch.     cholestyramine light 4 G packet  Commonly known as:  PREVALITE  Take 4 g by mouth daily as needed (for loose bowels).     ciprofloxacin 250 MG tablet  Commonly known as:  CIPRO  Take 1 tablet (250 mg total) by mouth 2 (two) times daily. X 3 days     cyclobenzaprine 5 MG tablet  Commonly known as:  FLEXERIL  Take 1 tablet (5 mg total) by mouth 3 (three) times daily.     donepezil 10 MG tablet  Commonly known as:  ARICEPT  Take 10 mg by mouth every morning.     Doxepin HCl 6 MG Tabs  Take 6 mg by mouth at bedtime.     ferrous sulfate 325 (65 FE) MG EC tablet  Take 325 mg by mouth daily with breakfast.     fluticasone 50 MCG/ACT nasal spray  Commonly known as:  FLONASE  Place 1 spray into both nostrils daily as needed for allergies or rhinitis.     ipratropium 0.03 % nasal spray  Commonly known as:  ATROVENT  Place 2 sprays into both nostrils 3 (three) times daily as needed for rhinitis.     loratadine 10 MG tablet  Commonly known as:  CLARITIN  Take 10 mg by mouth daily as needed for allergies.     LORazepam 0.5 MG tablet  Commonly known as:  ATIVAN  Take 0.5 tablets (0.25 mg total) by mouth every 8 (eight) hours as needed for anxiety or sleep.     losartan 100 MG tablet  Commonly known as:  COZAAR  Take 50 mg by mouth at bedtime.     mupirocin ointment 2 %  Commonly known as:  BACTROBAN  Apply 1 application topically daily with lunch.     MYRBETRIQ 25 MG Tb24 tablet  Generic drug:  mirabegron ER  Take 25 mg by mouth every evening.     nitroGLYCERIN  0.4 MG SL tablet  Commonly known as:  NITROSTAT  Place 0.4 mg under the tongue every 5 (five) minutes as needed for chest pain.     pantoprazole 40 MG tablet  Commonly known as:  PROTONIX  Take 40 mg by mouth 2 (two) times daily as needed (for heartburn).     potassium chloride SA 20 MEQ tablet  Commonly known as:  K-DUR,KLOR-CON  Take 20 mEq by  mouth daily with lunch.     predniSONE 1 MG tablet  Commonly known as:  DELTASONE  Take 2 tablets (2 mg total) by mouth every morning. Restart after completing the prednisone taper     predniSONE 10 MG tablet  Commonly known as:  DELTASONE  Prednisone dosing: Take  Prednisone 40mg  (4 tabs) x 2 days, then taper to 30mg  (3 tabs) x 3 days, then 20mg  (2 tabs) x 3days, then 10mg  (1 tab) x 3days, then resume your maintenance home dose   Dispense:  30 tabs, refills: None     rOPINIRole 1 MG tablet  Commonly known as:  REQUIP  Take 1 mg by mouth 3 (three) times daily. 1mg  at noon, 1mg  2 hours before bed, then 1mg  at bedtime     sertraline 25 MG tablet  Commonly known as:  ZOLOFT  Take 25 mg by mouth daily.     traMADol 50 MG tablet  Commonly known as:  ULTRAM  Take 1 tablet (50 mg total) by mouth every 6 (six) hours as needed for severe pain.     warfarin 2.5 MG tablet  Commonly known as:  COUMADIN  Take 2.5 mg by mouth every evening.        No orders of the defined types were placed in this encounter.     There is no immunization history on file for this patient.  Social History  Substance Use Topics  . Smoking status: Never Smoker   . Smokeless tobacco: Never Used  . Alcohol Use: No    Family history is + dementia   Review of Systems  DATA OBTAINED: from patient, nurse GENERAL:  no fevers, fatigue, appetite changes SKIN: No itching, rash or wounds EYES: No eye pain, redness, discharge EARS: No earache, tinnitus, change in hearing NOSE: No congestion, drainage or bleeding  MOUTH/THROAT: No mouth or tooth pain, No sore  throat RESPIRATORY: Says breathing is better today still has occasional cough productive of white phlegm CARDIAC: No chest pain, palpitations, lower extremity edema  GI: No abdominal pain, No N/V/D or constipation, No heartburn or reflux  GU: Treated for UTI but does not really overtly complain of dysuria at this time MUSCULOSKELETAL: back pain improving NEUROLOGIC: No headache, dizziness or focal weakness PSYCHIATRIC: No c/o anxiety or sadness   Filed Vitals:   07/28/15 1405  BP: 140/79  Pulse: 74  Temp: 97.4 F (36.3 C)  Resp: 20    SpO2 Readings from Last 1 Encounters:  07/22/15 98%        Physical Exam  GENERAL APPEARANCE: Alert, conversant,  No acute distress.  SKIN: No diaphoresis rash HEAD: Normocephalic, atraumatic  EYES: Conjunctiva/lids clear. Pupils round, reactive. EOMs intact.  EARS: External exam WNL, canals clear. Hearing grossly normal.  NOSE: No deformity or discharge.  MOUTH/THROAT: Oropharynx clear mucous membranes moist  RESPIRATORY: Breathing is even, unlabored. Does have somewhat diffuse coarse breath sounds on expiration   CARDIOVASCULAR: Heart RRR no murmurs, rubs or gallops. No peripheral edema.   GASTROINTESTINAL: Abdomen is soft, non-tender, not distended w/ normal bowel sounds. GENITOURINARY: Bladder non tender, not distended  MUSCULOSKELETAL: No abnormal joints or musculature NEUROLOGIC:  Cranial nerves 2-12 grossly intact. Moves all extremities  PSYCHIATRIC: Mood and affect appropriate to situation with dementia, no behavioral issues  Patient Active Problem List   Diagnosis Date Noted  . Acute bronchitis 07/28/2015  . Chronic anticoagulation 07/28/2015  . Intractable back pain 07/20/2015  . Back pain 07/20/2015  . Cellulitis and abscess  of left buttock 04/12/2013  . Stricture and stenosis of esophagus 10/10/2012  . Dementia 10/06/2012  . Dysphagia 10/06/2012  . Axillary abscess - bilateral, multiple 09/27/2012  . Hypotension  08/24/2012  . Near syncope 08/24/2012  . Abscess of axilla, left 06/27/2012  . Bradycardia 06/13/2012  . Abscess 06/11/2012  . Chest pain 06/09/2012  . Cellulitis 06/09/2012  . Weakness of both legs 06/09/2012  . PMR (polymyalgia rheumatica)   . Restless leg syndrome   . Angina pectoris, unstable 03/02/2012  . Anemia 03/02/2012  . Dyspnea 02/03/2012  . UTI (urinary tract infection) 02/03/2012  . CAD (coronary artery disease)   . HTN (hypertension)   . VTE (venous thromboembolism) 10/01/2010  . HYPERCHOLESTEROLEMIA  IIA 08/27/2009  . Hyperlipidemia 12/11/2008  . HYPERTENSION, BENIGN 12/11/2008   Labs.  07/28/2015.  INR 2.5-  CBC    Component Value Date/Time   WBC 8.3 07/22/2015 0555   WBC 9.7 04/10/2008 1100   RBC 4.06 07/22/2015 0555   RBC 4.76 04/10/2008 1100   HGB 11.3* 07/22/2015 0555   HGB 13.8 04/10/2008 1100   HCT 36.3 07/22/2015 0555   HCT 41.2 04/10/2008 1100   PLT 275 07/22/2015 0555   PLT 261 04/10/2008 1100   MCV 89.4 07/22/2015 0555   MCV 86.6 04/10/2008 1100   LYMPHSABS 1.3 07/20/2015 1857   LYMPHSABS 1.7 04/10/2008 1100   MONOABS 0.4 07/20/2015 1857   MONOABS 0.6 04/10/2008 1100   EOSABS 0.1 07/20/2015 1857   EOSABS 0.1 04/10/2008 1100   BASOSABS 0.0 07/20/2015 1857   BASOSABS 0.0 04/10/2008 1100    CMP     Component Value Date/Time   NA 135 07/21/2015 0455   K 3.9 07/21/2015 0455   CL 106 07/21/2015 0455   CO2 22 07/21/2015 0455   GLUCOSE 88 07/21/2015 0455   BUN 9 07/21/2015 0455   CREATININE 0.87 07/21/2015 0455   CALCIUM 8.6* 07/21/2015 0455   PROT 6.2 02/28/2014 1050   ALBUMIN 3.2* 02/28/2014 1050   AST 16 02/28/2014 1050   ALT 11 02/28/2014 1050   ALKPHOS 41 02/28/2014 1050   BILITOT 0.3 02/28/2014 1050   GFRNONAA 57* 07/21/2015 0455   GFRAA >60 07/21/2015 0455    No results found for: HGBA1C   Mr Lumbar Spine Wo Contrast  07/20/2015   CLINICAL DATA:  Low back pain with gait dysfunction, sudden onset after rolling over in  bed.  EXAM: MRI LUMBAR SPINE WITHOUT CONTRAST  TECHNIQUE: Multiplanar, multisequence MR imaging of the lumbar spine was performed. No intravenous contrast was administered.  COMPARISON:  None.  FINDINGS: T12 and L3 superior endplate fractures are remote given there is no associated marrow edema (minimal fluid signal along the L3 superior endplate is likely degenerative). There is notable T2 hyperintensity within the L4-5 disc, but this is considered degenerative rather than infectious given vacuum phenomenon, history, and intact subchondral bone.  Normal conus signal and morphology.  Colonic diverticulosis.  At least 4 cm left ovarian cyst, which has enlarged since abdominal CT in 2008. On axial imaging, there may be septations superiorly. Mild ectasia of the infrarenal aorta.  Numerous renal cysts. 5 mm pancreatic body cyst, stable to minimally enlarged since 2009.  Degenerative changes:  L2-L3: Mild disc bulging and ligament overgrowth. No herniation or stenosis.  L3-L4: Mild annulus bulging and ligament overgrowth. There is crowding of the subarticular recesses without neural compression.  L4-L5: Circumferential disc bulging with facet and ligament overgrowth causes moderate to advanced canal stenosis with minimal  subarachnoid space remaining. Stenosis is greater on the left where there is subarticular recess impingement on L5. Patent foramina.  L5-S1:Facet arthropathy with mild bony and ligamentous overgrowth. Mild retrolisthesis. No herniation or impingement.  IMPRESSION: 1. No acute finding. 2. Remote T12 and L3 superior endplate fractures. 3. L4-5 moderate to advanced degenerative canal stenosis. 4. 4 cm partly septated left ovarian cyst which has slowly enlarged since 2008 abdominal CT. If clinically warranted, this could be evaluated with outpatient pelvic sonography.   Electronically Signed   By: Monte Fantasia M.D.   On: 07/20/2015 20:17    Not all labs, radiology exams or other studies done during  hospitalization come through on my EPIC note; however they are reviewed by me.    Assessment and Plan  #1-cough with suspected bronchitis per chest x-ray-she is on Robitussin as needed as well as duo nebs-I did encourage her to use her duo nebs apparently she has uses sporadically today but encourage her to use this on a more consistent basis-she says her breathing is improved today she is still coughing up some white sputum feel she would benefit from the Robitussin as well-continue to monitor vital signs pulse ox every shift for 72 hours to ensure stability.  #2 anticoagulation management on Coumadin-INR has risen fairly rapidly on 3 mg we will reduce her back to 2-1/2 mg a day and recheck this in 2 days to keep an eye on this as I note she is completing course of ciprofloxacin which can elevate  INR.  #3 UTI again she is completing course of Cipro for Escherichia coli and Klebsiella Cipro is effective against both of these per review of sensitivities.  Also update CBC with differential BMP for updated values.  [id number]

## 2015-07-31 ENCOUNTER — Non-Acute Institutional Stay (SKILLED_NURSING_FACILITY): Payer: Medicare Other | Admitting: Internal Medicine

## 2015-07-31 ENCOUNTER — Encounter: Payer: Self-pay | Admitting: Internal Medicine

## 2015-07-31 DIAGNOSIS — N10 Acute tubulo-interstitial nephritis: Secondary | ICD-10-CM | POA: Diagnosis not present

## 2015-07-31 DIAGNOSIS — D72829 Elevated white blood cell count, unspecified: Secondary | ICD-10-CM | POA: Diagnosis not present

## 2015-07-31 NOTE — Progress Notes (Signed)
Patient ID: [name] [name], female   DOB: 05/26/1921, 81 y.o.   MRN: [id number]  MRN: [id number] Name: [name] [name]  Sex: female Age: 81 y.o. DOB: Jun 02, 1921  [place] #: [name] Facility/Room:515 Level Of Care: SNF Provider: Wille Celeste Emergency Contacts: Extended Emergency Contact Information Primary Emergency Contact: Smiddy,[name] Address: [place], [place] [address] [place] Phone: [phone] Mobile Phone: [phone] Relation: Son Secondary Emergency Contact: [place] Phone: [phone] Relation: Daughter  Code Status:   Allergies: Ambien; Codeine phosphate; Darvocet; Promethazine hcl; Sulfamethoxazole; Vicodin; Amoxicillin; and Penicillins  Chief Complaint  Patient presents with  . Acute Visit   Follow-up leukocytosis  HPI: Patient is 81 y.o. 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 Pt was admitted to hospital from 9/18-20 with inability to ambulate and no new MRI findings. Stay complicated by presumed UTI and abx were started. Pt was admitted to SNF for OT/PT. While at [facility] e followed forHTN, tx with norvasc and losartan, dementia, tx with aricept and HLD, tx with lipitor. Over the weekend apparently she developed a cough with some possible chest congestion-x-ray showed bronchitis bilateral-she is on duo nebs as needed every 6 hours Robitussin is also been started-she was started empirically on doxycycline for bronchitis this is complicated with her history of numerous antibiotic allergies and being on Coumadin. Doxycycline Has been discontinued  Ishe is also completing a course of Cipro for UTI multiple organisms including Escherichia coli and Klebsiella-I have reviewed the cultures and Cipro is effective against both of these --he  has been afebrile and denies any fever or chills.  I do note on lab today she does have a mildly elevated white count of 11.8 with elevated absolute granulocytes-I also note she is on a prednisone taper status post hospitalization she does have a history of polymyalgia rheumatica and they increased her prednisone fairly significantly and a tapering it down I suspect white count could be somewhat a residual effect of this  D    Past Medical History  Diagnosis Date  . CAD (coronary artery disease)     S/p PTCA / stenting (last cath 2004, multiple LAD stents, 2 stents in the right coronary artery all patent)  . 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 Started coumadin  . HTN (hypertension)   . Restless leg syndrome   . PMR (polymyalgia rheumatica)   . Axillary abscess   . Anxiety   . Dementia   . Complication of anesthesia   . PONV (postoperative nausea and vomiting)   . GERD (gastroesophageal reflux disease)   . Cancer     Past Surgical History  Procedure Laterality Date  . Colectomy    . Stent      cardiac x 8 stents.  . Incision and drainage      bilateral axillary, non specific staff  . Incision and drainage abscess  09/28/2012    Procedure: INCISION AND DRAINAGE ABSCESS;  Surgeon: Zenovia Jarred, MD;  Location: [facility];  Service: General;  Laterality: Bilateral;  . Esophagogastroduodenoscopy (egd) with esophageal dilation  10/10/2012    Procedure: ESOPHAGOGASTRODUODENOSCOPY (EGD) WITH ESOPHAGEAL DILATION;  Surgeon: Inda Castle, MD;  Location: [place];  Service: Endoscopy;  Laterality: N/A;  .  Breast lumpectomy    . Heart stents  x 8      Medication List       This list is accurate as of: 07/31/15 10:06 PM.  Always use your most recent med list.               acetaminophen 500 MG tablet  Commonly known as:  TYLENOL  Take 500 mg by mouth 3 (three) times daily as needed for moderate pain (NOT MORE THAN 3,000 MG IN 24 HOUR PERIOD). For pain      amLODipine 10 MG tablet  Commonly known as:  NORVASC  Take 10 mg by mouth every morning.     aspirin EC 81 MG tablet  Take 1 tablet (81 mg total) by mouth daily.     atorvastatin 20 MG tablet  Commonly known as:  LIPITOR  Take 10 mg by mouth every evening.     CHILDRENS CHEWABLE MULTI VITS PO  Take 1 tablet by mouth daily with lunch.     cholecalciferol 1000 UNITS tablet  Commonly known as:  VITAMIN D  Take 2,000 Units by mouth daily with lunch.     cholestyramine light 4 G packet  Commonly known as:  PREVALITE  Take 4 g by mouth daily as needed (for loose bowels).     ciprofloxacin 250 MG tablet  Commonly known as:  CIPRO  Take 1 tablet (250 mg total) by mouth 2 (two) times daily. X 3 days     cyclobenzaprine 5 MG tablet  Commonly known as:  FLEXERIL  Take 1 tablet (5 mg total) by mouth 3 (three) times daily.     donepezil 10 MG tablet  Commonly known as:  ARICEPT  Take 10 mg by mouth every morning.     Doxepin HCl 6 MG Tabs  Take 6 mg by mouth at bedtime.     ferrous sulfate 325 (65 FE) MG EC tablet  Take 325 mg by mouth daily with breakfast.     fluticasone 50 MCG/ACT nasal spray  Commonly known as:  FLONASE  Place 1 spray into both nostrils daily as needed for allergies or rhinitis.     ipratropium 0.03 % nasal spray  Commonly known as:  ATROVENT  Place 2 sprays into both nostrils 3 (three) times daily as needed for rhinitis.     loratadine 10 MG tablet  Commonly known as:  CLARITIN  Take 10 mg by mouth daily as needed for allergies.     LORazepam 0.5 MG tablet  Commonly known as:  ATIVAN  Take 0.5 tablets (0.25 mg total) by mouth every 8 (eight) hours as needed for anxiety or sleep.     losartan 100 MG tablet  Commonly known as:  COZAAR  Take 50 mg by mouth at bedtime.     mupirocin ointment 2 %  Commonly known as:  BACTROBAN  Apply 1 application topically daily with lunch.     MYRBETRIQ 25 MG Tb24 tablet  Generic drug:  mirabegron ER  Take 25 mg  by mouth every evening.     nitroGLYCERIN 0.4 MG SL tablet  Commonly known as:  NITROSTAT  Place 0.4 mg under the tongue every 5 (five) minutes as needed for chest pain.     pantoprazole 40 MG tablet  Commonly known as:  PROTONIX  Take 40 mg by mouth 2 (two) times daily as needed (for heartburn).     potassium chloride SA 20 MEQ tablet  Commonly known as:  K-DUR,KLOR-CON  Take 20 mEq by mouth daily with lunch.     predniSONE 1 MG tablet  Commonly known as:  DELTASONE  Take 2 tablets (2 mg total) by mouth every morning. Restart after completing the prednisone taper     predniSONE 10 MG tablet  Commonly known as:  DELTASONE  Prednisone dosing: Take  Prednisone 40mg  (4 tabs) x 2 days, then taper to 30mg  (3 tabs) x 3 days, then 20mg  (2 tabs) x 3days, then 10mg  (1 tab) x 3days, then resume your maintenance home dose   Dispense:  30 tabs, refills: None     rOPINIRole 1 MG tablet  Commonly known as:  REQUIP  Take 1 mg by mouth 3 (three) times daily. 1mg  at noon, 1mg  2 hours before bed, then 1mg  at bedtime     sertraline 25 MG tablet  Commonly known as:  ZOLOFT  Take 25 mg by mouth daily.     traMADol 50 MG tablet  Commonly known as:  ULTRAM  Take 1 tablet (50 mg total) by mouth every 6 (six) hours as needed for severe pain.     warfarin 2.5 MG tablet  Commonly known as:  COUMADIN  Take 2.5 mg by mouth every evening.        No orders of the defined types were placed in this encounter.     There is no immunization history on file for this patient.  Social History  Substance Use Topics  . Smoking status: Never Smoker   . Smokeless tobacco: Never Used  . Alcohol Use: No    Family history is + dementia   Review of Systems  DATA OBTAINED: from patient, nurse GENERAL:  no fevers, fatigue, appetite changes SKIN: No itching, rash or wounds EYES: No eye pain, redness, discharge EARS: No earache, tinnitus, change in hearing NOSE: No congestion, drainage or bleeding   MOUTH/THROAT: No mouth or tooth pain, No sore throat RESPIRATORY: Says breathing is stable still has occasional cough productive of white phlegm CARDIAC: No chest pain, palpitations, lower extremity edema  GI: No abdominal pain, No N/V/D or constipation, No heartburn or reflux  GU: Treated for UTI but does not really overtly complain of dysuria at this time MUSCULOSKELETAL: back pain improving NEUROLOGIC: No headache, dizziness or focal weakness PSYCHIATRIC: No c/o anxiety or sadness   Filed Vitals:   07/31/15 1429  BP: 114/73  Pulse: 64  Temp: 98.4 F (36.9 C)  Resp: 16    SpO2 Readings from Last 1 Encounters:  07/22/15 98%        Physical Exam  GENERAL APPEARANCE: Alert, conversant,  No acute distress. Sitting comfortably in her chair SKIN: No diaphoresis rash HEAD: Normocephalic, atraumatic  EYES: Conjunctiva/lids clear. Pupils round, reactive. EOMs intact.  EARS: External exam WNL, canals clear. Hearing grossly normal.  NOSE: No deformity or discharge.  MOUTH/THROAT: Oropharynx clear mucous membranes moist  RESPIRATORY: Breathing is even, unlabored. Does have somewhat diffuse coarse breath sounds on expiration left greater than right  CARDIOVASCULAR: Heart RRR no murmurs, rubs or gallops. No peripheral edema.   GASTROINTESTINAL: Abdomen is soft, non-tender, not distended w/ normal bowel sounds. GENITOURINARY: Bladder non tender, not distended  MUSCULOSKELETAL: No abnormal joints or musculature NEUROLOGIC:  Cranial nerves 2-12 grossly intact. Moves all extremities  PSYCHIATRIC: Mood and affect appropriate to situation with dementia, no behavioral issues  Patient Active Problem List   Diagnosis Date Noted  . Leukocytosis 07/31/2015  . Acute bronchitis 07/28/2015  . Chronic anticoagulation 07/28/2015  .  Intractable back pain 07/20/2015  . Back pain 07/20/2015  . Cellulitis and abscess of left buttock 04/12/2013  . Stricture and stenosis of esophagus 10/10/2012  .  Dementia 10/06/2012  . Dysphagia 10/06/2012  . Axillary abscess - bilateral, multiple 09/27/2012  . Hypotension 08/24/2012  . Near syncope 08/24/2012  . Abscess of axilla, left 06/27/2012  . Bradycardia 06/13/2012  . Abscess 06/11/2012  . Chest pain 06/09/2012  . Cellulitis 06/09/2012  . Weakness of both legs 06/09/2012  . PMR (polymyalgia rheumatica)   . Restless leg syndrome   . Angina pectoris, unstable 03/02/2012  . Anemia 03/02/2012  . Dyspnea 02/03/2012  . UTI (urinary tract infection) 02/03/2012  . CAD (coronary artery disease)   . HTN (hypertension)   . VTE (venous thromboembolism) 10/01/2010  . HYPERCHOLESTEROLEMIA  IIA 08/27/2009  . Hyperlipidemia 12/11/2008  . HYPERTENSION, BENIGN 12/11/2008   Labs.  07/29/2015.  WBC 11.8 hemoglobin 11.8 platelets 311.  Sodium 139 potassium 4.2 BUN 30 creatinine 1.2.    07/28/2015.  INR 2.5-  CBC    Component Value Date/Time   WBC 8.3 07/22/2015 0555   WBC 9.7 04/10/2008 1100   RBC 4.06 07/22/2015 0555   RBC 4.76 04/10/2008 1100   HGB 11.3* 07/22/2015 0555   HGB 13.8 04/10/2008 1100   HCT 36.3 07/22/2015 0555   HCT 41.2 04/10/2008 1100   PLT 275 07/22/2015 0555   PLT 261 04/10/2008 1100   MCV 89.4 07/22/2015 0555   MCV 86.6 04/10/2008 1100   LYMPHSABS 1.3 07/20/2015 1857   LYMPHSABS 1.7 04/10/2008 1100   MONOABS 0.4 07/20/2015 1857   MONOABS 0.6 04/10/2008 1100   EOSABS 0.1 07/20/2015 1857   EOSABS 0.1 04/10/2008 1100   BASOSABS 0.0 07/20/2015 1857   BASOSABS 0.0 04/10/2008 1100    CMP     Component Value Date/Time   NA 135 07/21/2015 0455   K 3.9 07/21/2015 0455   CL 106 07/21/2015 0455   CO2 22 07/21/2015 0455   GLUCOSE 88 07/21/2015 0455   BUN 9 07/21/2015 0455   CREATININE 0.87 07/21/2015 0455   CALCIUM 8.6* 07/21/2015 0455   PROT 6.2 02/28/2014 1050   ALBUMIN 3.2* 02/28/2014 1050   AST 16 02/28/2014 1050   ALT 11 02/28/2014 1050   ALKPHOS 41 02/28/2014 1050   BILITOT 0.3 02/28/2014 1050    GFRNONAA 57* 07/21/2015 0455   GFRAA >60 07/21/2015 0455    No results found for: HGBA1C   Mr Lumbar Spine Wo Contrast  07/20/2015   CLINICAL DATA:  Low back pain with gait dysfunction, sudden onset after rolling over in bed.  EXAM: MRI LUMBAR SPINE WITHOUT CONTRAST  TECHNIQUE: Multiplanar, multisequence MR imaging of the lumbar spine was performed. No intravenous contrast was administered.  COMPARISON:  None.  FINDINGS: T12 and L3 superior endplate fractures are remote given there is no associated marrow edema (minimal fluid signal along the L3 superior endplate is likely degenerative). There is notable T2 hyperintensity within the L4-5 disc, but this is considered degenerative rather than infectious given vacuum phenomenon, history, and intact subchondral bone.  Normal conus signal and morphology.  Colonic diverticulosis.  At least 4 cm left ovarian cyst, which has enlarged since abdominal CT in 2008. On axial imaging, there may be septations superiorly. Mild ectasia of the infrarenal aorta.  Numerous renal cysts. 5 mm pancreatic body cyst, stable to minimally enlarged since 2009.  Degenerative changes:  L2-L3: Mild disc bulging and ligament overgrowth. No herniation or stenosis.  L3-L4:  Mild annulus bulging and ligament overgrowth. There is crowding of the subarticular recesses without neural compression.  L4-L5: Circumferential disc bulging with facet and ligament overgrowth causes moderate to advanced canal stenosis with minimal subarachnoid space remaining. Stenosis is greater on the left where there is subarticular recess impingement on L5. Patent foramina.  L5-S1:Facet arthropathy with mild bony and ligamentous overgrowth. Mild retrolisthesis. No herniation or impingement.  IMPRESSION: 1. No acute finding. 2. Remote T12 and L3 superior endplate fractures. 3. L4-5 moderate to advanced degenerative canal stenosis. 4. 4 cm partly septated left ovarian cyst which has slowly enlarged since 2008 abdominal  CT. If clinically warranted, this could be evaluated with outpatient pelvic sonography.   Electronically Signed   By: Monte Fantasia M.D.   On: 07/20/2015 20:17    Not all labs, radiology exams or other studies done during hospitalization come through on my EPIC note; however they are reviewed by me.    Assessment and Plan  1 leukocytosis her white count is mildly elevated from the lab done on September 20-again she has been on a prolonged prednisone taper dose which could be contributing to this--she is completing a course of antibiotic for UTI but does not currently complain of dysuria she has been afebrile and does not complain of any chills-at this point continue to monitor and check a CBC with differential tomorrow for follow-up and see where it is trending  #2-cough with suspected bronchitis per chest x-ray-she is on Robitussin as needed as well as duo nebs I did encourage her again to use her nebulizers says her breathing is stable she has been afebrile--does not complain of any increased cough today.     #3 UTI again she is completing course of Cipro for Escherichia coli and Klebsiella Cipro is effective against both of these per review of sensitivities.   .  [id number]

## 2015-08-04 ENCOUNTER — Non-Acute Institutional Stay (SKILLED_NURSING_FACILITY): Payer: Medicare Other | Admitting: Internal Medicine

## 2015-08-04 ENCOUNTER — Encounter: Payer: Self-pay | Admitting: Internal Medicine

## 2015-08-04 DIAGNOSIS — M353 Polymyalgia rheumatica: Secondary | ICD-10-CM

## 2015-08-04 DIAGNOSIS — J208 Acute bronchitis due to other specified organisms: Secondary | ICD-10-CM | POA: Diagnosis not present

## 2015-08-04 DIAGNOSIS — N10 Acute tubulo-interstitial nephritis: Secondary | ICD-10-CM | POA: Diagnosis not present

## 2015-08-04 DIAGNOSIS — R531 Weakness: Secondary | ICD-10-CM | POA: Diagnosis not present

## 2015-08-04 DIAGNOSIS — Z7901 Long term (current) use of anticoagulants: Secondary | ICD-10-CM

## 2015-08-04 DIAGNOSIS — I10 Essential (primary) hypertension: Secondary | ICD-10-CM

## 2015-08-04 DIAGNOSIS — F0390 Unspecified dementia without behavioral disturbance: Secondary | ICD-10-CM | POA: Diagnosis not present

## 2015-08-04 NOTE — Progress Notes (Signed)
Patient ID: [name] [name], female   DOB: [birth year], 81 y.o.   MRN: [id number]   MRN: [id number] Name: [name] [name]  Sex: female Age: 81 y.o. DOB: 14-May-1921  [facility] #: [facility] Facility/Room:515 Level Of Care: SNF Provider: Wille Celeste Emergency Contacts: Extended Emergency Contact Information Primary Emergency Contact: Delmore,[name] Address: [place], [place] [address] [place] Phone: [phone] Mobile Phone: [phone] Relation: Son Secondary Emergency Contact: [place] Phone: [phone] Relation: Daughter  Code Status:   Allergies: Ambien; Codeine phosphate; Darvocet; Promethazine hcl; Sulfamethoxazole; Vicodin; Amoxicillin; and Penicillins  Chief Complaint  Patient presents with  . Discharge Note   Follow-up leukocytosis  HPI: Patient is 81 y.o. 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 Pt was admitted to hospital from 9/18-20 with inability to ambulate and no new MRI findings. Stay complicated by presumed UTI and abx were started. Pt was admitted to SNF for OT/PT. While at [facility] e followed forHTN, tx with norvasc and losartan, dementia, tx with aricept and HLD, tx with lipitor.Recently she developed a cough with some possible chest congestion-x-ray showed bronchitis bilateral- She was treated with nebs as well as Robitussin this appears to have improved--she says her cough has improved  She has completeda course of Cipro for UTI multiple organisms including Escherichia coli and Klebsiella-I have reviewed the cultures and Cipro is effective against both of these --he has been afebrile and denies any fever or chills.   -I also note she is on a prednisone taper status post hospitalization she does have a history of polymyalgia  rheumatica and they increased her prednisone fairly significantly and a tapering it down I suspect white count could be somewhat a residual effect of this  D    Past Medical History  Diagnosis Date  . CAD (coronary artery disease)     S/p PTCA / stenting (last cath 2004, multiple LAD stents, 2 stents in the right coronary artery all patent)  . 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 Started coumadin  . HTN (hypertension)   . Restless leg syndrome   . PMR (polymyalgia rheumatica) (HCC)   . Axillary abscess   . Anxiety   . Dementia   . Complication of anesthesia   . PONV (postoperative nausea and vomiting)   . GERD (gastroesophageal reflux disease)   . Cancer [facility])     Past Surgical History  Procedure Laterality Date  . Colectomy    . Stent      cardiac x 8 stents.  . Incision and drainage      bilateral axillary, non specific staff  . Incision and drainage abscess  09/28/2012    Procedure: INCISION AND DRAINAGE ABSCESS;  Surgeon: Zenovia Jarred, MD;  Location: [place];  Service: General;  Laterality: Bilateral;  . Esophagogastroduodenoscopy (egd) with esophageal dilation  10/10/2012    Procedure: ESOPHAGOGASTRODUODENOSCOPY (EGD) WITH ESOPHAGEAL DILATION;  Surgeon: Inda Castle, MD;  Location: [place];  Service: Endoscopy;  Laterality: N/A;  . Breast lumpectomy    . Heart stents  x 8      Medication List       This list is accurate as of: 08/04/15 11:59 PM.  Always use your most recent med  list.               acetaminophen 500 MG tablet  Commonly known as:  TYLENOL  Take 500 mg by mouth 3 (three) times daily as needed for moderate pain (NOT MORE THAN 3,000 MG IN 24 HOUR PERIOD). For pain     amLODipine 10 MG tablet  Commonly known as:  NORVASC  Take 10 mg by mouth every morning.     aspirin EC 81 MG tablet  Take 1 tablet (81 mg total) by mouth daily.     atorvastatin 20 MG tablet  Commonly known as:  LIPITOR  Take 10 mg by mouth every  evening.     CHILDRENS CHEWABLE MULTI VITS PO  Take 1 tablet by mouth daily with lunch.     cholecalciferol 1000 UNITS tablet  Commonly known as:  VITAMIN D  Take 2,000 Units by mouth daily with lunch.     cholestyramine light 4 G packet  Commonly known as:  PREVALITE  Take 4 g by mouth daily as needed (for loose bowels).     ciprofloxacin 250 MG tablet  Commonly known as:  CIPRO  Take 1 tablet (250 mg total) by mouth 2 (two) times daily. X 3 days     cyclobenzaprine 5 MG tablet  Commonly known as:  FLEXERIL  Take 1 tablet (5 mg total) by mouth 3 (three) times daily.     donepezil 10 MG tablet  Commonly known as:  ARICEPT  Take 10 mg by mouth every morning.     Doxepin HCl 6 MG Tabs  Take 6 mg by mouth at bedtime.     ferrous sulfate 325 (65 FE) MG EC tablet  Take 325 mg by mouth daily with breakfast.     fluticasone 50 MCG/ACT nasal spray  Commonly known as:  FLONASE  Place 1 spray into both nostrils daily as needed for allergies or rhinitis.     ipratropium 0.03 % nasal spray  Commonly known as:  ATROVENT  Place 2 sprays into both nostrils 3 (three) times daily as needed for rhinitis.     loratadine 10 MG tablet  Commonly known as:  CLARITIN  Take 10 mg by mouth daily as needed for allergies.     LORazepam 0.5 MG tablet  Commonly known as:  ATIVAN  Take 0.5 tablets (0.25 mg total) by mouth every 8 (eight) hours as needed for anxiety or sleep.     losartan 100 MG tablet  Commonly known as:  COZAAR  Take 50 mg by mouth at bedtime.     mupirocin ointment 2 %  Commonly known as:  BACTROBAN  Apply 1 application topically daily with lunch.     MYRBETRIQ 25 MG Tb24 tablet  Generic drug:  mirabegron ER  Take 25 mg by mouth every evening.     nitroGLYCERIN 0.4 MG SL tablet  Commonly known as:  NITROSTAT  Place 0.4 mg under the tongue every 5 (five) minutes as needed for chest pain.     pantoprazole 40 MG tablet  Commonly known as:  PROTONIX  Take 40 mg by  mouth 2 (two) times daily as needed (for heartburn).     potassium chloride SA 20 MEQ tablet  Commonly known as:  K-DUR,KLOR-CON  Take 20 mEq by mouth daily with lunch.     predniSONE 1 MG tablet  Commonly known as:  DELTASONE  Take 2 tablets (2 mg total) by mouth every morning. Restart after completing the prednisone  taper     predniSONE 10 MG tablet  Commonly known as:  DELTASONE  Prednisone dosing: Take  Prednisone 40mg  (4 tabs) x 2 days, then taper to 30mg  (3 tabs) x 3 days, then 20mg  (2 tabs) x 3days, then 10mg  (1 tab) x 3days, then resume your maintenance home dose   Dispense:  30 tabs, refills: None     rOPINIRole 1 MG tablet  Commonly known as:  REQUIP  Take 1 mg by mouth 3 (three) times daily. 1mg  at noon, 1mg  2 hours before bed, then 1mg  at bedtime     sertraline 25 MG tablet  Commonly known as:  ZOLOFT  Take 25 mg by mouth daily.     traMADol 50 MG tablet  Commonly known as:  ULTRAM  Take 1 tablet (50 mg total) by mouth every 6 (six) hours as needed for severe pain.     warfarin 2.5 MG tablet  Commonly known as:  COUMADIN  Take 2.5 mg by mouth every evening.        No orders of the defined types were placed in this encounter.     There is no immunization history on file for this patient.  Social History  Substance Use Topics  . Smoking status: Never Smoker   . Smokeless tobacco: Never Used  . Alcohol Use: No    Family history is + dementia   Review of Systems  DATA OBTAINED: from patient, nurse GENERAL:  no fevers, fatigue, appetite changes SKIN: No itching, rash or wounds EYES: No eye pain, redness, discharge EARS: No earache, tinnitus, change in hearing NOSE: No congestion, drainage or bleeding  MOUTH/THROAT: No mouth or tooth pain, No sore throat RESPIRATORY: Says breathing is stable still has occasional cough but this has improved CARDIAC: No chest pain, palpitations, lower extremity edema  GI: No abdominal pain, No N/V/D or constipation, No  heartburn or reflux  GU: Treated for UTI but does not really overtly complain of dysuria at this time MUSCULOSKELETAL: back pain improving NEUROLOGIC: No headache, dizziness or focal weakness PSYCHIATRIC: No c/o anxiety or sadness   Filed Vitals:   08/04/15 2220  BP: 120/78  Pulse: 60  Temp: 97 F (36.1 C)  Resp: 16    SpO2 Readings from Last 1 Encounters:  08/05/15 97%        Physical Exam  GENERAL APPEARANCE: Alert, conversant,  No acute distress. Sitting comfortably in her chair SKIN: No diaphoresis rash HEAD: Normocephalic, atraumatic  EYES: Conjunctiva/lids clear. Pupils round, reactive. EOMs intact.  EARS: External exam WNL, canals clear. Hearing grossly normal.  NOSE: No deformity or discharge.  MOUTH/THROAT: Oropharynx clear mucous membranes moist  RESPIRATORY: Breathing is even, unlabored. I do not really note congestion today  CARDIOVASCULAR: Heart RRR no murmurs, rubs or gallops. No peripheral edema.   GASTROINTESTINAL: Abdomen is soft, non-tender, not distended w/ normal bowel sounds. GENITOURINARY: Bladder non tender, not distended  MUSCULOSKELETAL: No abnormal joints or musculatureshe is able to get up without assistance and ambulate with a walker although still somewhat weak NEUROLOGIC:  Cranial nerves 2-12 grossly intact. Moves all extremities  PSYCHIATRIC: Mood and affect appropriate to situation with dementia, no behavioral issues  Patient Active Problem List   Diagnosis Date Noted  . Leukocytosis 07/31/2015  . Acute bronchitis 07/28/2015  . Chronic anticoagulation 07/28/2015  . Intractable back pain 07/20/2015  . Back pain 07/20/2015  . Cellulitis and abscess of left buttock 04/12/2013  . Stricture and stenosis of esophagus 10/10/2012  .  Dementia 10/06/2012  . Dysphagia 10/06/2012  . Axillary abscess - bilateral, multiple 09/27/2012  . Hypotension 08/24/2012  . Near syncope 08/24/2012  . Abscess of axilla, left 06/27/2012  . Bradycardia  06/13/2012  . Abscess 06/11/2012  . Chest pain 06/09/2012  . Cellulitis 06/09/2012  . Weakness of both legs 06/09/2012  . PMR (polymyalgia rheumatica) (HCC)   . Restless leg syndrome   . Angina pectoris, unstable ([place]) 03/02/2012  . Anemia 03/02/2012  . Dyspnea 02/03/2012  . UTI (urinary tract infection) 02/03/2012  . CAD (coronary artery disease)   . HTN (hypertension)   . VTE (venous thromboembolism) 10/01/2010  . HYPERCHOLESTEROLEMIA  IIA 08/27/2009  . Hyperlipidemia 12/11/2008  . HYPERTENSION, BENIGN 12/11/2008   Labs.  07/29/2015.  WBC 11.8 hemoglobin 11.8 platelets 311.  Sodium 139 potassium 4.2 BUN 30 creatinine 1.2.    07/28/2015.  INR 2.5-  CBC    Component Value Date/Time   WBC 8.3 07/22/2015 0555   WBC 9.7 04/10/2008 1100   RBC 4.06 07/22/2015 0555   RBC 4.76 04/10/2008 1100   HGB 11.3* 07/22/2015 0555   HGB 13.8 04/10/2008 1100   HCT 36.3 07/22/2015 0555   HCT 41.2 04/10/2008 1100   PLT 275 07/22/2015 0555   PLT 261 04/10/2008 1100   MCV 89.4 07/22/2015 0555   MCV 86.6 04/10/2008 1100   LYMPHSABS 1.3 07/20/2015 1857   LYMPHSABS 1.7 04/10/2008 1100   MONOABS 0.4 07/20/2015 1857   MONOABS 0.6 04/10/2008 1100   EOSABS 0.1 07/20/2015 1857   EOSABS 0.1 04/10/2008 1100   BASOSABS 0.0 07/20/2015 1857   BASOSABS 0.0 04/10/2008 1100    CMP     Component Value Date/Time   NA 135 07/21/2015 0455   K 3.9 07/21/2015 0455   CL 106 07/21/2015 0455   CO2 22 07/21/2015 0455   GLUCOSE 88 07/21/2015 0455   BUN 9 07/21/2015 0455   CREATININE 0.87 07/21/2015 0455   CALCIUM 8.6* 07/21/2015 0455   PROT 6.2 02/28/2014 1050   ALBUMIN 3.2* 02/28/2014 1050   AST 16 02/28/2014 1050   ALT 11 02/28/2014 1050   ALKPHOS 41 02/28/2014 1050   BILITOT 0.3 02/28/2014 1050   GFRNONAA 57* 07/21/2015 0455   GFRAA >60 07/21/2015 0455    No results found for: HGBA1C   Mr Lumbar Spine Wo Contrast  07/20/2015   CLINICAL DATA:  Low back pain with gait dysfunction,  sudden onset after rolling over in bed.  EXAM: MRI LUMBAR SPINE WITHOUT CONTRAST  TECHNIQUE: Multiplanar, multisequence MR imaging of the lumbar spine was performed. No intravenous contrast was administered.  COMPARISON:  None.  FINDINGS: T12 and L3 superior endplate fractures are remote given there is no associated marrow edema (minimal fluid signal along the L3 superior endplate is likely degenerative). There is notable T2 hyperintensity within the L4-5 disc, but this is considered degenerative rather than infectious given vacuum phenomenon, history, and intact subchondral bone.  Normal conus signal and morphology.  Colonic diverticulosis.  At least 4 cm left ovarian cyst, which has enlarged since abdominal CT in 2008. On axial imaging, there may be septations superiorly. Mild ectasia of the infrarenal aorta.  Numerous renal cysts. 5 mm pancreatic body cyst, stable to minimally enlarged since 2009.  Degenerative changes:  L2-L3: Mild disc bulging and ligament overgrowth. No herniation or stenosis.  L3-L4: Mild annulus bulging and ligament overgrowth. There is crowding of the subarticular recesses without neural compression.  L4-L5: Circumferential disc bulging with facet and ligament overgrowth  causes moderate to advanced canal stenosis with minimal subarachnoid space remaining. Stenosis is greater on the left where there is subarticular recess impingement on L5. Patent foramina.  L5-S1:Facet arthropathy with mild bony and ligamentous overgrowth. Mild retrolisthesis. No herniation or impingement.  IMPRESSION: 1. No acute finding. 2. Remote T12 and L3 superior endplate fractures. 3. L4-5 moderate to advanced degenerative canal stenosis. 4. 4 cm partly septated left ovarian cyst which has slowly enlarged since 2008 abdominal CT. If clinically warranted, this could be evaluated with outpatient pelvic sonography.   Electronically Signed   By: Monte Fantasia M.D.   On: 07/20/2015 20:17    Not all labs, radiology  exams or other studies done during hospitalization come through on my EPIC note; however they are reviewed by me.    Assessment and Plan \\history  of back pain-MRI of the back showed no acute finding remote T12 and L3 superior endplate fractures advanced degenerative canal stenosis L4-L5-she is being treated with prednisone as well as muscle relaxer and tramadol as appears to be stable.  Coronary artery disease appears stable she is on aspirin and a statin.  History of  DVT she is on Coumadin and INR is supratherapeutic today at 3.5 will hold Coumadin tonight and recheck this tomorrow this will need follow-up obviously tomorrow and by primary care provider when she is discharged.  History of UTIs she's completed antibiotic does not really complain of dysuria will update a WBC to see where we stand although I suspect prednisone would contribute to an elevated white count baseline. History of PMR polymyalgia rheumaticas- continues on prednisone this appears relatively stable she still does have some weakness.  History hyperlipidemia she is on Lipitor will defer aggressive workup to primary care provider.  History of dementia appears to be stable she is pleasant and cooperative she is on Aricept.  And hypertension she is on Cozaar and Norvasc appears to be stable recent blood pressures 120/78-135/80.   bronchitis-again this appears to be improving her cough has improved again we'll see where her white count stands.  History of restless legs she does continue on Requip at this point monitor is not really complaining of leg pain today although I suspect this will be an issue at times  Patient still has some weakness certainly would benefit from continued PT and OT-she also will need home health support-she does have strong family support   [id number]-of note greater than 30 minutes spent preparing this discharge summary-greater than 50% of time spent coordinating plan of care for numerous  diagnoses.

## 2015-08-05 ENCOUNTER — Non-Acute Institutional Stay (SKILLED_NURSING_FACILITY): Payer: Medicare Other | Admitting: Internal Medicine

## 2015-08-05 ENCOUNTER — Encounter (HOSPITAL_COMMUNITY): Payer: Self-pay | Admitting: Emergency Medicine

## 2015-08-05 ENCOUNTER — Emergency Department (HOSPITAL_COMMUNITY)
Admission: EM | Admit: 2015-08-05 | Discharge: 2015-08-05 | Disposition: A | Discharge status: Skilled Nursing Facility | Payer: Medicare Other | Attending: Emergency Medicine | Admitting: Emergency Medicine

## 2015-08-05 DIAGNOSIS — Z872 Personal history of diseases of the skin and subcutaneous tissue: Secondary | ICD-10-CM | POA: Insufficient documentation

## 2015-08-05 DIAGNOSIS — Z859 Personal history of malignant neoplasm, unspecified: Secondary | ICD-10-CM | POA: Diagnosis not present

## 2015-08-05 DIAGNOSIS — Z880 Allergy status to penicillin: Secondary | ICD-10-CM | POA: Diagnosis not present

## 2015-08-05 DIAGNOSIS — Z9861 Coronary angioplasty status: Secondary | ICD-10-CM | POA: Insufficient documentation

## 2015-08-05 DIAGNOSIS — I10 Essential (primary) hypertension: Secondary | ICD-10-CM | POA: Insufficient documentation

## 2015-08-05 DIAGNOSIS — D72829 Elevated white blood cell count, unspecified: Secondary | ICD-10-CM | POA: Diagnosis not present

## 2015-08-05 DIAGNOSIS — G2581 Restless legs syndrome: Secondary | ICD-10-CM | POA: Diagnosis not present

## 2015-08-05 DIAGNOSIS — F0390 Unspecified dementia without behavioral disturbance: Secondary | ICD-10-CM | POA: Insufficient documentation

## 2015-08-05 DIAGNOSIS — Z86718 Personal history of other venous thrombosis and embolism: Secondary | ICD-10-CM | POA: Insufficient documentation

## 2015-08-05 DIAGNOSIS — Z86711 Personal history of pulmonary embolism: Secondary | ICD-10-CM | POA: Diagnosis not present

## 2015-08-05 DIAGNOSIS — Z7901 Long term (current) use of anticoagulants: Secondary | ICD-10-CM | POA: Insufficient documentation

## 2015-08-05 DIAGNOSIS — Z79899 Other long term (current) drug therapy: Secondary | ICD-10-CM | POA: Insufficient documentation

## 2015-08-05 DIAGNOSIS — Z7951 Long term (current) use of inhaled steroids: Secondary | ICD-10-CM | POA: Insufficient documentation

## 2015-08-05 DIAGNOSIS — I2510 Atherosclerotic heart disease of native coronary artery without angina pectoris: Secondary | ICD-10-CM | POA: Diagnosis not present

## 2015-08-05 DIAGNOSIS — K5900 Constipation, unspecified: Secondary | ICD-10-CM | POA: Insufficient documentation

## 2015-08-05 DIAGNOSIS — Z7982 Long term (current) use of aspirin: Secondary | ICD-10-CM | POA: Diagnosis not present

## 2015-08-05 DIAGNOSIS — F419 Anxiety disorder, unspecified: Secondary | ICD-10-CM | POA: Diagnosis not present

## 2015-08-05 DIAGNOSIS — K219 Gastro-esophageal reflux disease without esophagitis: Secondary | ICD-10-CM | POA: Diagnosis not present

## 2015-08-05 DIAGNOSIS — M79604 Pain in right leg: Secondary | ICD-10-CM | POA: Diagnosis present

## 2015-08-05 NOTE — ED Provider Notes (Signed)
CSN: [id number]     Arrival date & time 08/05/15  0136 History  By signing my name below, I, Eustaquio Maize, attest that this documentation has been prepared under the direction and in the presence of Kale Rondeau, MD. Electronically Signed: Eustaquio Maize, ED Scribe. 08/05/2015. 2:02 AM.  Chief Complaint  Patient presents with  . Leg Pain   LEVEL 5 CAVEAT for dementia   Patient is a 81 y.o. female presenting with leg pain. The history is provided by medical records. The history is limited by the condition of the patient. No language interpreter was used.  Leg Pain Location:  Leg Injury: no   Leg location:  L leg and R leg (even patient states this is her restless legs ) Pain details:    Timing:  Constant Chronicity:  Chronic Dislocation: no   Foreign body present:  No foreign bodies Relieved by:  Nothing Associated symptoms: no swelling   Risk factors: no concern for non-accidental trauma      HPI Comments: [name] [name] is a 81 y.o. female brought in by ambulance, with hx dementia and restless leg syndrome who presents to the Emergency Department complaining of bilateral restless legs x 1 day. Pt usually takes 500 Tylenol, Ativan, Tramadol, Flexeril, Requip, and Doxepin for her leg pain. Per medical records from nursing home, pt has not gotten any Tylenol today. Pt was given 0.5 mg Ativan en route. She denies any recent falls.   Past Medical History  Diagnosis Date  . CAD (coronary artery disease)     S/p PTCA / stenting (last cath 2004, multiple LAD stents, 2 stents in the right coronary artery all patent)  . 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 Started coumadin  . HTN (hypertension)   . Restless leg syndrome   . PMR (polymyalgia rheumatica) (HCC)   . Axillary abscess   . Anxiety   . Dementia   . Complication of anesthesia   . PONV (postoperative nausea and vomiting)   . GERD (gastroesophageal reflux disease)   . Cancer [facility])    Past Surgical History   Procedure Laterality Date  . Colectomy    . Stent      cardiac x 8 stents.  . Incision and drainage      bilateral axillary, non specific staff  . Incision and drainage abscess  09/28/2012    Procedure: INCISION AND DRAINAGE ABSCESS;  Surgeon: Zenovia Jarred, MD;  Location: [place];  Service: General;  Laterality: Bilateral;  . Esophagogastroduodenoscopy (egd) with esophageal dilation  10/10/2012    Procedure: ESOPHAGOGASTRODUODENOSCOPY (EGD) WITH ESOPHAGEAL DILATION;  Surgeon: Inda Castle, MD;  Location: [place];  Service: Endoscopy;  Laterality: N/A;  . Breast lumpectomy    . Heart stents  x 8   No family history on file. Social History  Substance Use Topics  . Smoking status: Never Smoker   . Smokeless tobacco: Never Used  . Alcohol Use: No   OB History    No data available     Review of Systems  Unable to perform ROS: Dementia   Allergies  Ambien; Codeine phosphate; Darvocet; Promethazine hcl; Sulfamethoxazole; Vicodin; Amoxicillin; and Penicillins  Home Medications   Prior to Admission medications   Medication Sig Start Date End Date Taking? Authorizing Provider  acetaminophen (TYLENOL) 500 MG tablet Take 500 mg by mouth 3 (three) times daily as needed for moderate pain (NOT MORE THAN 3,000 MG IN 24 HOUR PERIOD). For pain  Historical Provider, MD  amLODipine (NORVASC) 10 MG tablet Take 10 mg by mouth every morning.     Historical Provider, MD  aspirin EC 81 MG tablet Take 1 tablet (81 mg total) by mouth daily. 03/01/14   Belkys A Regalado, MD  atorvastatin (LIPITOR) 20 MG tablet Take 10 mg by mouth every evening.     Historical Provider, MD  cholecalciferol (VITAMIN D) 1000 UNITS tablet Take 2,000 Units by mouth daily with lunch.     Historical Provider, MD  cholestyramine light (PREVALITE) 4 G packet Take 4 g by mouth daily as needed (for loose bowels).    Historical Provider, MD  ciprofloxacin (CIPRO) 250 MG tablet Take 1 tablet (250 mg total) by mouth 2  (two) times daily. X 3 days 07/22/15   Ripudeep Krystal Eaton, MD  cyclobenzaprine (FLEXERIL) 5 MG tablet Take 1 tablet (5 mg total) by mouth 3 (three) times daily. 07/22/15   Ripudeep Krystal Eaton, MD  donepezil (ARICEPT) 10 MG tablet Take 10 mg by mouth every morning.    Historical Provider, MD  Doxepin HCl 6 MG TABS Take 6 mg by mouth at bedtime.    Historical Provider, MD  ferrous sulfate 325 (65 FE) MG EC tablet Take 325 mg by mouth daily with breakfast.    Historical Provider, MD  fluticasone (FLONASE) 50 MCG/ACT nasal spray Place 1 spray into both nostrils daily as needed for allergies or rhinitis.    Historical Provider, MD  ipratropium (ATROVENT) 0.03 % nasal spray Place 2 sprays into both nostrils 3 (three) times daily as needed for rhinitis.    Historical Provider, MD  loratadine (CLARITIN) 10 MG tablet Take 10 mg by mouth daily as needed for allergies.    Historical Provider, MD  LORazepam (ATIVAN) 0.5 MG tablet Take 0.5 tablets (0.25 mg total) by mouth every 8 (eight) hours as needed for anxiety or sleep. 07/22/15   Ripudeep Krystal Eaton, MD  losartan (COZAAR) 100 MG tablet Take 50 mg by mouth at bedtime.     Historical Provider, MD  mirabegron ER (MYRBETRIQ) 25 MG TB24 Take 25 mg by mouth every evening.     Historical Provider, MD  mupirocin ointment (BACTROBAN) 2 % Apply 1 application topically daily with lunch.     Historical Provider, MD  nitroGLYCERIN (NITROSTAT) 0.4 MG SL tablet Place 0.4 mg under the tongue every 5 (five) minutes as needed for chest pain.    Historical Provider, MD  pantoprazole (PROTONIX) 40 MG tablet Take 40 mg by mouth 2 (two) times daily as needed (for heartburn).    Historical Provider, MD  Pediatric Multiple Vit-C-FA (CHILDRENS CHEWABLE MULTI VITS PO) Take 1 tablet by mouth daily with lunch.     Historical Provider, MD  potassium chloride SA (K-DUR,KLOR-CON) 20 MEQ tablet Take 20 mEq by mouth daily with lunch.     Historical Provider, MD  predniSONE (DELTASONE) 1 MG tablet Take 2  tablets (2 mg total) by mouth every morning. Restart after completing the prednisone taper 07/22/15   Ripudeep Krystal Eaton, MD  predniSONE (DELTASONE) 10 MG tablet Prednisone dosing: Take  Prednisone 40mg  (4 tabs) x 2 days, then taper to 30mg  (3 tabs) x 3 days, then 20mg  (2 tabs) x 3days, then 10mg  (1 tab) x 3days, then resume your maintenance home dose   Dispense:  30 tabs, refills: None 07/22/15   Ripudeep K Rai, MD  rOPINIRole (REQUIP) 1 MG tablet Take 1 mg by mouth 3 (three) times daily. 1mg  at noon,  1mg  2 hours before bed, then 1mg  at bedtime    Historical Provider, MD  sertraline (ZOLOFT) 25 MG tablet Take 25 mg by mouth daily.    Historical Provider, MD  traMADol (ULTRAM) 50 MG tablet Take 1 tablet (50 mg total) by mouth every 6 (six) hours as needed for severe pain. 07/22/15   Ripudeep Krystal Eaton, MD  warfarin (COUMADIN) 2.5 MG tablet Take 2.5 mg by mouth every evening.     Historical Provider, MD   Triage Vitals: BP 122/67 mmHg  Pulse 85  Temp(Src) 97.4 F (36.3 C) (Oral)  Resp 18  SpO2 96%   Physical Exam  Constitutional: She appears well-developed and well-nourished. No distress.  HENT:  Head: Normocephalic and atraumatic.  Mouth/Throat: Oropharynx is clear and moist. Mucous membranes are not dry. No oropharyngeal exudate.  Eyes: Conjunctivae and EOM are normal. Pupils are equal, round, and reactive to light.  Neck: Normal range of motion. Neck supple. No tracheal deviation present.  No carotid bruit Trachea is midline  Cardiovascular: Normal rate and regular rhythm.   Pulmonary/Chest: Effort normal and breath sounds normal. No respiratory distress. She has no wheezes. She has no rales.  Abdominal: Bowel sounds are normal. She exhibits no mass. There is no rebound and no guarding.  Constipated  Musculoskeletal: Normal range of motion. She exhibits no edema or tenderness.  All compartments are soft DP pulses intact bilaterally Exam on legs is normal No homan's sign  Neurological: She is  alert. She has normal reflexes.  Full ROM of all extremities  Skin: Skin is warm and dry. No rash noted. No erythema. No pallor.  Psychiatric: She has a normal mood and affect. Her behavior is normal.  Nursing note and vitals reviewed.   ED Course  Procedures (including critical care time)  DIAGNOSTIC STUDIES: Oxygen Saturation is 96% on RA, normal by my interpretation.    COORDINATION OF CARE: 2:01 AM-Discussed treatment plan with pt at bedside and pt agreed to plan.   Labs Review Labs Reviewed - No data to display  Imaging Review No results found.   EKG Interpretation None      MDM   Final diagnoses:  None  Sent in from the nursing home for her RLS for which she is on multiple medications. No trauma, no edema, no pain with palpation,  Pulses are intact.  FROM B.  She clearly has RLS on exam.  She will need to follow up with her PMD for adjustment of these medication.  She is safe for discharge at this time  Ebircs.I, Li Bobo-RASCH,Claudell Wohler K, personally performed the services described in this documentation. All medical record entries made by the scribe were at my direction and in my presence.  I have reviewed the chart and discharge instructions and agree that the record reflects my personal performance and is accurate and complete. Jonmarc Bodkin-RASCH,Berniece Abid K.  08/05/2015. 2:30 AM.     Kaseem Vastine, MD 08/05/15 8937

## 2015-08-05 NOTE — ED Notes (Signed)
Bed: FE07 Expected date:  Expected time:  Means of arrival:  Comments: EMS elderly with restless legs

## 2015-08-05 NOTE — ED Notes (Signed)
Re-checked patient's blood pressure due to her moving.

## 2015-08-05 NOTE — ED Notes (Addendum)
Pt presents from [facility] via EMS for leg pain. Hx of restless leg syndrome. Pt received 0.5mg  Ativan approximately 0030.   Last VS: 148/70, 80hr, [id number].

## 2015-08-06 ENCOUNTER — Encounter: Payer: Self-pay | Admitting: Internal Medicine

## 2015-08-06 NOTE — Progress Notes (Signed)
MRN: [id number] Name: [name] [name]  Sex: female Age: 81 y.o. DOB: Nov 17, 1920  [facility] #: [facility] Facility/Room: Level Of Care: SNF Provider: Inocencio Homes D Emergency Contacts: Extended Emergency Contact Information Primary Emergency Contact: Plato,[name] Address: [place],  [address] [place] Phone: [phone] Mobile Phone: [phone] Relation: Son Secondary Emergency Contact: [place] Phone: [phone] Relation: Daughter  Code Status:   Allergies: Ambien; Codeine phosphate; Darvocet; Promethazine hcl; Sulfamethoxazole; Vicodin; Amoxicillin; and Penicillins  No chief complaint on file.   HPI: Patient is 81 y.o. female with with past medical history of DVT and PE on warfarin, history of CAD, dementia, polymyalgia rheumatica on very low-dose prednisone, hypertension, was brought to SNF due to back pain for OT/PT. Pt is being d/c to day, however, lab drawn today showed WBC 14.7 with a left shift. Pt's departure is being delayed while this problem is worked up here per pt's RP wishes.  Past Medical History  Diagnosis Date  . CAD (coronary artery disease)     S/p PTCA / stenting (last cath 2004, multiple LAD stents, 2 stents in the right coronary artery all patent)  . 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 Started coumadin  . HTN (hypertension)   . Restless leg syndrome   . PMR (polymyalgia rheumatica) (HCC)   . Axillary abscess   . Anxiety   . Dementia   . Complication of anesthesia   . PONV (postoperative nausea and vomiting)   . GERD (gastroesophageal reflux disease)   . Cancer [facility])     Past Surgical History  Procedure Laterality Date  . Colectomy    . Stent      cardiac x 8 stents.  . Incision and drainage      bilateral axillary, non specific staff  . Incision and drainage abscess  09/28/2012    Procedure: INCISION AND DRAINAGE ABSCESS;  Surgeon:  Zenovia Jarred, MD;  Location: [place];  Service: General;  Laterality: Bilateral;  . Esophagogastroduodenoscopy (egd) with esophageal dilation  10/10/2012    Procedure: ESOPHAGOGASTRODUODENOSCOPY (EGD) WITH ESOPHAGEAL DILATION;  Surgeon: Inda Castle, MD;  Location: [place];  Service: Endoscopy;  Laterality: N/A;  . Breast lumpectomy    . Heart stents  x 8      Medication List    Notice    This visit is on the same day as an admission, and a visit start time could not be determined. If the visit took place after discharge, manually review the med list with the patient.     Current Outpatient Prescriptions on File Prior to Visit  Medication Sig Dispense Refill  . acetaminophen (TYLENOL) 500 MG tablet Take 500 mg by mouth 3 (three) times daily as needed for moderate pain (NOT MORE THAN 3,000 MG IN 24 HOUR PERIOD). For pain    . amLODipine (NORVASC) 10 MG tablet Take 10 mg by mouth every morning.     Marland Kitchen aspirin EC 81 MG tablet Take 1 tablet (81 mg total) by mouth daily. 30 tablet 0  . atorvastatin (LIPITOR) 20 MG tablet Take 10 mg by mouth every evening.     . cholecalciferol (VITAMIN D) 1000 UNITS tablet Take 2,000 Units by mouth daily with lunch.     . cholestyramine light (PREVALITE) 4 G packet Take 4 g by mouth daily as needed (for loose bowels).    Marland Kitchen  ciprofloxacin (CIPRO) 250 MG tablet Take 1 tablet (250 mg total) by mouth 2 (two) times daily. X 3 days 6 tablet 0  . cyclobenzaprine (FLEXERIL) 5 MG tablet Take 1 tablet (5 mg total) by mouth 3 (three) times daily. 90 tablet 0  . donepezil (ARICEPT) 10 MG tablet Take 10 mg by mouth every morning.    . Doxepin HCl 6 MG TABS Take 6 mg by mouth at bedtime.    . ferrous sulfate 325 (65 FE) MG EC tablet Take 325 mg by mouth daily with breakfast.    . fluticasone (FLONASE) 50 MCG/ACT nasal spray Place 1 spray into both nostrils daily as needed for allergies or rhinitis.    Marland Kitchen ipratropium (ATROVENT) 0.03 % nasal spray Place 2 sprays into  both nostrils 3 (three) times daily as needed for rhinitis.    Marland Kitchen loratadine (CLARITIN) 10 MG tablet Take 10 mg by mouth daily as needed for allergies.    Marland Kitchen LORazepam (ATIVAN) 0.5 MG tablet Take 0.5 tablets (0.25 mg total) by mouth every 8 (eight) hours as needed for anxiety or sleep. 20 tablet 0  . losartan (COZAAR) 100 MG tablet Take 50 mg by mouth at bedtime.     . mirabegron ER (MYRBETRIQ) 25 MG TB24 Take 25 mg by mouth every evening.     . mupirocin ointment (BACTROBAN) 2 % Apply 1 application topically daily with lunch.     . nitroGLYCERIN (NITROSTAT) 0.4 MG SL tablet Place 0.4 mg under the tongue every 5 (five) minutes as needed for chest pain.    . pantoprazole (PROTONIX) 40 MG tablet Take 40 mg by mouth 2 (two) times daily as needed (for heartburn).    . Pediatric Multiple Vit-C-FA (CHILDRENS CHEWABLE MULTI VITS PO) Take 1 tablet by mouth daily with lunch.     . potassium chloride SA (K-DUR,KLOR-CON) 20 MEQ tablet Take 20 mEq by mouth daily with lunch.     . predniSONE (DELTASONE) 1 MG tablet Take 2 tablets (2 mg total) by mouth every morning. Restart after completing the prednisone taper    . predniSONE (DELTASONE) 10 MG tablet Prednisone dosing: Take  Prednisone 40mg  (4 tabs) x 2 days, then taper to 30mg  (3 tabs) x 3 days, then 20mg  (2 tabs) x 3days, then 10mg  (1 tab) x 3days, then resume your maintenance home dose   Dispense:  30 tabs, refills: None 26 tablet 0  . rOPINIRole (REQUIP) 1 MG tablet Take 1 mg by mouth 3 (three) times daily. 1mg  at noon, 1mg  2 hours before bed, then 1mg  at bedtime    . sertraline (ZOLOFT) 25 MG tablet Take 25 mg by mouth daily.    . traMADol (ULTRAM) 50 MG tablet Take 1 tablet (50 mg total) by mouth every 6 (six) hours as needed for severe pain. 60 tablet 0  . warfarin (COUMADIN) 2.5 MG tablet Take 2.5 mg by mouth every evening.      No current facility-administered medications on file prior to visit.     No orders of the defined types were placed in  this encounter.     There is no immunization history on file for this patient.  Social History  Substance Use Topics  . Smoking status: Never Smoker   . Smokeless tobacco: Never Used  . Alcohol Use: No    Review of Systems  DATA OBTAINED: from nurse, RP GENERAL:  no fevers, fatigue, appetite changes SKIN: No itching, rash HEENT: No complaint RESPIRATORY: + cough and congestion onset  last week, CXR then showed bronchits, no PNA, pt tx sx, no  wheezing,no SOB; sx did not progress CARDIAC: No chest pain, palpitations, lower extremity edema  GI: No abdominal pain, No N/V/D or constipation, No heartburn or reflux  GU: No dysuria, frequency or urgency, or incontinence but hx UTI's when pt wears adult diapers which she has been for several weeks MUSCULOSKELETAL: No unrelieved bone/joint pain NEUROLOGIC: No headache, dizziness  PSYCHIATRIC: No overt anxiety or sadness  Filed Vitals:   08/06/15 1219  BP: 128/78  Pulse: 72  Temp: 98.1 F (36.7 C)  Resp: 18    Physical Exam  GENERAL APPEARANCE: Alert, conversant, No acute distress  SKIN: No diaphoresis rash HEENT: Unremarkable RESPIRATORY: Breathing is even, unlabored. Lung sounds are with some rhonchi, np wheezing or rales   CARDIOVASCULAR: Heart RRR no murmurs, rubs or gallops. No peripheral edema  GASTROINTESTINAL: Abdomen is soft, non-tender, not distended w/ normal bowel sounds.  GENITOURINARY: Bladder non tender, not distended  MUSCULOSKELETAL: No abnormal joints or musculature NEUROLOGIC: Cranial nerves 2-12 grossly intact. Moves all extremities PSYCHIATRIC: Mood and affect appropriate to situation with dementia, no behavioral issues  Patient Active Problem List   Diagnosis Date Noted  . Leukocytosis 07/31/2015  . Acute bronchitis 07/28/2015  . Chronic anticoagulation 07/28/2015  . Intractable back pain 07/20/2015  . Back pain 07/20/2015  . Cellulitis and abscess of left buttock 04/12/2013  . Stricture and  stenosis of esophagus 10/10/2012  . Dementia 10/06/2012  . Dysphagia 10/06/2012  . Axillary abscess - bilateral, multiple 09/27/2012  . Hypotension 08/24/2012  . Near syncope 08/24/2012  . Abscess of axilla, left 06/27/2012  . Bradycardia 06/13/2012  . Abscess 06/11/2012  . Chest pain 06/09/2012  . Cellulitis 06/09/2012  . Weakness of both legs 06/09/2012  . PMR (polymyalgia rheumatica) (HCC)   . Restless leg syndrome   . Angina pectoris, unstable ([place]) 03/02/2012  . Anemia 03/02/2012  . Dyspnea 02/03/2012  . UTI (urinary tract infection) 02/03/2012  . CAD (coronary artery disease)   . HTN (hypertension)   . VTE (venous thromboembolism) 10/01/2010  . HYPERCHOLESTEROLEMIA  IIA 08/27/2009  . Hyperlipidemia 12/11/2008  . HYPERTENSION, BENIGN 12/11/2008    CBC    Component Value Date/Time   WBC 8.3 07/22/2015 0555   WBC 9.7 04/10/2008 1100   RBC 4.06 07/22/2015 0555   RBC 4.76 04/10/2008 1100   HGB 11.3* 07/22/2015 0555   HGB 13.8 04/10/2008 1100   HCT 36.3 07/22/2015 0555   HCT 41.2 04/10/2008 1100   PLT 275 07/22/2015 0555   PLT 261 04/10/2008 1100   MCV 89.4 07/22/2015 0555   MCV 86.6 04/10/2008 1100   LYMPHSABS 1.3 07/20/2015 1857   LYMPHSABS 1.7 04/10/2008 1100   MONOABS 0.4 07/20/2015 1857   MONOABS 0.6 04/10/2008 1100   EOSABS 0.1 07/20/2015 1857   EOSABS 0.1 04/10/2008 1100   BASOSABS 0.0 07/20/2015 1857   BASOSABS 0.0 04/10/2008 1100    CMP     Component Value Date/Time   NA 135 07/21/2015 0455   K 3.9 07/21/2015 0455   CL 106 07/21/2015 0455   CO2 22 07/21/2015 0455   GLUCOSE 88 07/21/2015 0455   BUN 9 07/21/2015 0455   CREATININE 0.87 07/21/2015 0455   CALCIUM 8.6* 07/21/2015 0455   PROT 6.2 02/28/2014 1050   ALBUMIN 3.2* 02/28/2014 1050   AST 16 02/28/2014 1050   ALT 11 02/28/2014 1050   ALKPHOS 41 02/28/2014 1050   BILITOT 0.3 02/28/2014 1050  GFRNONAA 57* 07/21/2015 0455   GFRAA >60 07/21/2015 0455    Assessment and  Plan  Leukocytosis Pt's WBC while in hosp had been elevated, had fallen to 12 on d/c and today was found to be 14.7  With 88 segs. Bronchits possible, occult UTI possible. After some consideration of where and how it was decided to to obtain urine with C and S and to start an abx today. Pt has been on cipro before but to cover for possible bronchitis also will use levaquin for 7 days, renal dosing for calculated CrCl -30. Nursing here will fax results of U/A with MIC, if applicable to pt's PCP for follow up.   Time spent > 45 min;> 50% of time with patient was spent reviewing records, labs, tests and studies, counseling and developing plan of care  Hennie Duos, MD

## 2015-08-06 NOTE — Assessment & Plan Note (Signed)
Pt's WBC while in hosp had been elevated, had fallen to 12 on d/c and today was found to be 14.7  With 88 segs. Bronchits possible, occult UTI possible. After some consideration of where and how it was decided to to obtain urine with C and S and to start an abx today. Pt has been on cipro before but to cover for possible bronchitis also will use levaquin for 7 days, renal dosing for calculated CrCl -30. Nursing here will fax results of U/A with MIC, if applicable to pt's PCP for follow up.

## 2015-08-09 ENCOUNTER — Encounter (HOSPITAL_COMMUNITY): Payer: Self-pay | Admitting: Emergency Medicine

## 2015-08-09 ENCOUNTER — Emergency Department (HOSPITAL_COMMUNITY): Payer: Medicare Other

## 2015-08-09 ENCOUNTER — Emergency Department (HOSPITAL_COMMUNITY)
Admission: EM | Admit: 2015-08-09 | Discharge: 2015-08-10 | Disposition: A | Discharge status: Home / Self Care | Payer: Medicare Other | Attending: Emergency Medicine | Admitting: Emergency Medicine

## 2015-08-09 DIAGNOSIS — I10 Essential (primary) hypertension: Secondary | ICD-10-CM | POA: Diagnosis not present

## 2015-08-09 DIAGNOSIS — Z880 Allergy status to penicillin: Secondary | ICD-10-CM | POA: Insufficient documentation

## 2015-08-09 DIAGNOSIS — F419 Anxiety disorder, unspecified: Secondary | ICD-10-CM | POA: Insufficient documentation

## 2015-08-09 DIAGNOSIS — R011 Cardiac murmur, unspecified: Secondary | ICD-10-CM | POA: Diagnosis not present

## 2015-08-09 DIAGNOSIS — I2510 Atherosclerotic heart disease of native coronary artery without angina pectoris: Secondary | ICD-10-CM | POA: Diagnosis not present

## 2015-08-09 DIAGNOSIS — F0390 Unspecified dementia without behavioral disturbance: Secondary | ICD-10-CM | POA: Insufficient documentation

## 2015-08-09 DIAGNOSIS — Z7951 Long term (current) use of inhaled steroids: Secondary | ICD-10-CM | POA: Diagnosis not present

## 2015-08-09 DIAGNOSIS — Z859 Personal history of malignant neoplasm, unspecified: Secondary | ICD-10-CM | POA: Insufficient documentation

## 2015-08-09 DIAGNOSIS — K219 Gastro-esophageal reflux disease without esophagitis: Secondary | ICD-10-CM | POA: Diagnosis not present

## 2015-08-09 DIAGNOSIS — Z7982 Long term (current) use of aspirin: Secondary | ICD-10-CM | POA: Diagnosis not present

## 2015-08-09 DIAGNOSIS — R1310 Dysphagia, unspecified: Secondary | ICD-10-CM

## 2015-08-09 DIAGNOSIS — Z79899 Other long term (current) drug therapy: Secondary | ICD-10-CM | POA: Insufficient documentation

## 2015-08-09 DIAGNOSIS — N390 Urinary tract infection, site not specified: Secondary | ICD-10-CM

## 2015-08-09 DIAGNOSIS — Z7952 Long term (current) use of systemic steroids: Secondary | ICD-10-CM | POA: Diagnosis not present

## 2015-08-09 DIAGNOSIS — R109 Unspecified abdominal pain: Secondary | ICD-10-CM | POA: Diagnosis present

## 2015-08-09 LAB — COMPREHENSIVE METABOLIC PANEL
ALK PHOS: 45 U/L (ref 38–126)
ALT: 16 U/L (ref 14–54)
AST: 19 U/L (ref 15–41)
Albumin: 3 g/dL — ABNORMAL LOW (ref 3.5–5.0)
Anion gap: 8 (ref 5–15)
BUN: 8 mg/dL (ref 6–20)
CHLORIDE: 103 mmol/L (ref 101–111)
CO2: 23 mmol/L (ref 22–32)
CREATININE: 1.14 mg/dL — AB (ref 0.44–1.00)
Calcium: 8.7 mg/dL — ABNORMAL LOW (ref 8.9–10.3)
GFR calc Af Amer: 47 mL/min — ABNORMAL LOW (ref >60)
GFR, EST NON AFRICAN AMERICAN: 40 mL/min — AB (ref >60)
Glucose, Bld: 117 mg/dL — ABNORMAL HIGH (ref 65–99)
Potassium: 4.2 mmol/L (ref 3.5–5.1)
SODIUM: 134 mmol/L — AB (ref 135–145)
Total Bilirubin: 0.3 mg/dL (ref 0.3–1.2)
Total Protein: 5.8 g/dL — ABNORMAL LOW (ref 6.5–8.1)

## 2015-08-09 LAB — URINALYSIS, ROUTINE W REFLEX MICROSCOPIC
BILIRUBIN URINE: NEGATIVE
GLUCOSE, UA: NEGATIVE mg/dL
KETONES UR: NEGATIVE mg/dL
Nitrite: POSITIVE — AB
PH: 5.5 (ref 5.0–8.0)
Protein, ur: NEGATIVE mg/dL
Specific Gravity, Urine: 1.007 (ref 1.005–1.030)
Urobilinogen, UA: 0.2 mg/dL (ref 0.0–1.0)

## 2015-08-09 LAB — CBC
HCT: 35 % — ABNORMAL LOW (ref 36.0–46.0)
Hemoglobin: 11.1 g/dL — ABNORMAL LOW (ref 12.0–15.0)
MCH: 28.7 pg (ref 26.0–34.0)
MCHC: 31.7 g/dL (ref 30.0–36.0)
MCV: 90.4 fL (ref 78.0–100.0)
PLATELETS: 250 10*3/uL (ref 150–400)
RBC: 3.87 MIL/uL (ref 3.87–5.11)
RDW: 15 % (ref 11.5–15.5)
WBC: 13 10*3/uL — ABNORMAL HIGH (ref 4.0–10.5)

## 2015-08-09 LAB — I-STAT CG4 LACTIC ACID, ED: Lactic Acid, Venous: 1.87 mmol/L (ref 0.5–2.0)

## 2015-08-09 LAB — I-STAT TROPONIN, ED: Troponin i, poc: 0 ng/mL (ref 0.00–0.08)

## 2015-08-09 LAB — URINE MICROSCOPIC-ADD ON

## 2015-08-09 LAB — LIPASE, BLOOD: LIPASE: 21 U/L — AB (ref 22–51)

## 2015-08-09 MED ORDER — CEPHALEXIN 250 MG PO CAPS
500.0000 mg | ORAL_CAPSULE | Freq: Once | ORAL | Status: AC
  Administered 2015-08-10: 500 mg via ORAL
  Filled 2015-08-09: qty 2

## 2015-08-09 MED ORDER — DEXTROSE 5 % IV SOLN: 1.0000 g | Freq: Once | INTRAVENOUS | Status: DC

## 2015-08-09 NOTE — ED Notes (Signed)
Patient comes from home states she has had ABD pain all day and increased tonight. Patient states she had a Bowel obstruction and states just wanted to get checked out. Patient denies any pain on arrival. Patient states  She had a BM today and was black and hard. Patient states she has not been able to eat solid food in a week.

## 2015-08-09 NOTE — ED Notes (Signed)
X-ray sent patient back to room without scan stating she needed to use the restroom and she is required to come back to the department to do so.

## 2015-08-09 NOTE — ED Provider Notes (Signed)
CSN: [id number]     Arrival date & time 08/09/15  2021 History   First MD Initiated Contact with Patient 08/09/15 2033     Chief Complaint  Patient presents with  . Abdominal Pain     (Consider location/radiation/quality/duration/timing/severity/associated sxs/prior Treatment) HPI Comments: The patient is a 81 year old female, very pleasant, history of dementia who complains of having abdominal pain. She reports that this abdominal pain is only present when she tries to swallow and feels as though the food will not go down. She points to her epigastrium in her throat. She denies chest pain, difficulty breathing, swelling, dysuria, diarrhea area and she has been tolerating liquids today. She does not have much in the way of history beyond that she does have dementia. She does not know why she is on antibiotics however according to the medical record she was prescribed Levaquin several days ago because of elevated white blood cell count and according to the notes a "potential bronchitis or occult urinary tract infection". The results of any chest x-ray or urinary testing is not available.  Further review of the medical record shows that the patient had an esophagram approximately one and a half years ago which showed a possible stricture, mucosal thickening likely consistent with esophagitis as well as dysmotility. It was recommended that she take small bites, continue her proton pump inhibitor. She has been balloon dilated in 2013. I discussed care with the patient's son, he is also aware of her prior gastrointestinal abnormalities.   Patient is a 81 y.o. female presenting with abdominal pain. The history is provided by the patient.  Abdominal Pain   Past Medical History  Diagnosis Date  . CAD (coronary artery disease)     S/p PTCA / stenting (last cath 2004, multiple LAD stents, 2 stents in the right coronary artery all patent)  . 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 Started  coumadin  . HTN (hypertension)   . Restless leg syndrome   . PMR (polymyalgia rheumatica) (HCC)   . Axillary abscess   . Anxiety   . Dementia   . Complication of anesthesia   . PONV (postoperative nausea and vomiting)   . GERD (gastroesophageal reflux disease)   . Cancer [facility])    Past Surgical History  Procedure Laterality Date  . Colectomy    . Stent      cardiac x 8 stents.  . Incision and drainage      bilateral axillary, non specific staff  . Incision and drainage abscess  09/28/2012    Procedure: INCISION AND DRAINAGE ABSCESS;  Surgeon: Zenovia Jarred, MD;  Location: [place];  Service: General;  Laterality: Bilateral;  . Esophagogastroduodenoscopy (egd) with esophageal dilation  10/10/2012    Procedure: ESOPHAGOGASTRODUODENOSCOPY (EGD) WITH ESOPHAGEAL DILATION;  Surgeon: Inda Castle, MD;  Location: [place];  Service: Endoscopy;  Laterality: N/A;  . Breast lumpectomy    . Heart stents  x 8   No family history on file. Social History  Substance Use Topics  . Smoking status: Never Smoker   . Smokeless tobacco: Never Used  . Alcohol Use: No   OB History    No data available     Review of Systems  Unable to perform ROS: Dementia  Gastrointestinal: Positive for abdominal pain.      Allergies  Ambien; Codeine phosphate; Darvocet; Promethazine hcl; Sulfamethoxazole; Vicodin; Amoxicillin; and Penicillins  Home Medications   Prior to Admission medications   Medication Sig Start Date  End Date Taking? Authorizing Provider  acetaminophen (TYLENOL) 500 MG tablet Take 500 mg by mouth 3 (three) times daily as needed for moderate pain (NOT MORE THAN 3,000 MG IN 24 HOUR PERIOD). For pain   Yes Historical Provider, MD  amLODipine (NORVASC) 10 MG tablet Take 10 mg by mouth every morning.    Yes Historical Provider, MD  aspirin EC 81 MG tablet Take 1 tablet (81 mg total) by mouth daily. 03/01/14  Yes Belkys A Regalado, MD  atorvastatin (LIPITOR) 20 MG tablet Take 10 mg by  mouth every evening.    Yes Historical Provider, MD  benzonatate (TESSALON PERLES) 100 MG capsule Take 100 mg by mouth 3 (three) times daily as needed for cough.   Yes Historical Provider, MD  cholecalciferol (VITAMIN D) 1000 UNITS tablet Take 2,000 Units by mouth daily with lunch.    Yes Historical Provider, MD  cholestyramine light (PREVALITE) 4 G packet Take 4 g by mouth daily as needed (for loose bowels).   Yes Historical Provider, MD  donepezil (ARICEPT) 10 MG tablet Take 10 mg by mouth every morning.   Yes Historical Provider, MD  ferrous sulfate 325 (65 FE) MG EC tablet Take 325 mg by mouth daily with breakfast.   Yes Historical Provider, MD  fluticasone (FLONASE) 50 MCG/ACT nasal spray Place 1 spray into both nostrils daily as needed for allergies or rhinitis.   Yes Historical Provider, MD  ipratropium (ATROVENT) 0.03 % nasal spray Place 2 sprays into both nostrils 3 (three) times daily as needed for rhinitis.   Yes Historical Provider, MD  loratadine (CLARITIN) 10 MG tablet Take 10 mg by mouth daily as needed for allergies.   Yes Historical Provider, MD  losartan (COZAAR) 100 MG tablet Take 50 mg by mouth at bedtime.    Yes Historical Provider, MD  mirabegron ER (MYRBETRIQ) 25 MG TB24 Take 25 mg by mouth every evening.    Yes Historical Provider, MD  mupirocin ointment (BACTROBAN) 2 % Apply 1 application topically daily with lunch.    Yes Historical Provider, MD  nitroGLYCERIN (NITROSTAT) 0.4 MG SL tablet Place 0.4 mg under the tongue every 5 (five) minutes as needed for chest pain.   Yes Historical Provider, MD  pantoprazole (PROTONIX) 40 MG tablet Take 40 mg by mouth 2 (two) times daily as needed (for heartburn).   Yes Historical Provider, MD  Pediatric Multiple Vit-C-FA (CHILDRENS CHEWABLE MULTI VITS PO) Take 1 tablet by mouth daily with lunch.    Yes Historical Provider, MD  potassium chloride SA (K-DUR,KLOR-CON) 20 MEQ tablet Take 20 mEq by mouth daily with lunch.    Yes Historical  Provider, MD  predniSONE (DELTASONE) 1 MG tablet Take 2 tablets (2 mg total) by mouth every morning. Restart after completing the prednisone taper 07/22/15  Yes Ripudeep K Rai, MD  rOPINIRole (REQUIP) 1 MG tablet Take 1 mg by mouth 3 (three) times daily. 1mg  at noon, 1mg  2 hours before bed, then 1mg  at bedtime   Yes Historical Provider, MD  sertraline (ZOLOFT) 25 MG tablet Take 25 mg by mouth daily.   Yes Historical Provider, MD  traMADol (ULTRAM) 50 MG tablet Take 1 tablet (50 mg total) by mouth every 6 (six) hours as needed for severe pain. 07/22/15  Yes Ripudeep Krystal Eaton, MD  warfarin (COUMADIN) 2.5 MG tablet Take 2.5 mg by mouth every evening.    Yes Historical Provider, MD  cephALEXin (KEFLEX) 500 MG capsule Take 1 capsule (500 mg total) by mouth  4 (four) times daily. 08/09/15   Noemi Chapel, MD  cyclobenzaprine (FLEXERIL) 5 MG tablet Take 1 tablet (5 mg total) by mouth 3 (three) times daily. Patient not taking: Reported on 08/09/2015 07/22/15   Ripudeep Krystal Eaton, MD  LORazepam (ATIVAN) 0.5 MG tablet Take 0.5 tablets (0.25 mg total) by mouth every 8 (eight) hours as needed for anxiety or sleep. Patient not taking: Reported on 08/09/2015 07/22/15   Ripudeep Krystal Eaton, MD  predniSONE (DELTASONE) 10 MG tablet Prednisone dosing: Take  Prednisone 40mg  (4 tabs) x 2 days, then taper to 30mg  (3 tabs) x 3 days, then 20mg  (2 tabs) x 3days, then 10mg  (1 tab) x 3days, then resume your maintenance home dose   Dispense:  30 tabs, refills: None Patient not taking: Reported on 08/09/2015 07/22/15   Ripudeep Krystal Eaton, MD  ranitidine (ZANTAC) 150 MG tablet Take 1 tablet (150 mg total) by mouth 2 (two) times daily. 08/10/15   Noemi Chapel, MD   BP 157/63 mmHg  Pulse 65  Temp(Src) 98.4 F (36.9 C) (Oral)  Resp 22  SpO2 96% Physical Exam  Constitutional: She appears well-developed and well-nourished. No distress.  HENT:  Head: Normocephalic and atraumatic.  Mouth/Throat: Oropharynx is clear and moist. No oropharyngeal exudate.   Eyes: Conjunctivae and EOM are normal. Pupils are equal, round, and reactive to light. Right eye exhibits no discharge. Left eye exhibits no discharge. No scleral icterus.  Neck: Normal range of motion. Neck supple. No JVD present. No thyromegaly present.  Cardiovascular: Normal rate, regular rhythm and intact distal pulses.  Exam reveals no gallop and no friction rub.   Murmur (soft systolic murmur) heard. Pulmonary/Chest: Effort normal and breath sounds normal. No respiratory distress. She has no wheezes. She has no rales.  Abdominal: Soft. Bowel sounds are normal. She exhibits no distension and no mass. There is tenderness (mild periumbilical tenderness, multiple old healed surgical scars, no guarding, no peritoneal signs, no pain in McBurney's point, no Murphy sign).  Musculoskeletal: Normal range of motion. She exhibits no edema or tenderness.  Lymphadenopathy:    She has no cervical adenopathy.  Neurological: She is alert. Coordination normal.  Skin: Skin is warm and dry. No rash noted. No erythema.  Psychiatric: She has a normal mood and affect. Her behavior is normal.  Nursing note and vitals reviewed.   ED Course  Procedures (including critical care time) Labs Review Labs Reviewed  LIPASE, BLOOD - Abnormal; Notable for the following:    Lipase 21 (*)    All other components within normal limits  COMPREHENSIVE METABOLIC PANEL - Abnormal; Notable for the following:    Sodium 134 (*)    Glucose, Bld 117 (*)    Creatinine, Ser 1.14 (*)    Calcium 8.7 (*)    Total Protein 5.8 (*)    Albumin 3.0 (*)    GFR calc non Af Amer 40 (*)    GFR calc Af Amer 47 (*)    All other components within normal limits  CBC - Abnormal; Notable for the following:    WBC 13.0 (*)    Hemoglobin 11.1 (*)    HCT 35.0 (*)    All other components within normal limits  URINALYSIS, ROUTINE W REFLEX MICROSCOPIC (NOT AT [facility]) - Abnormal; Notable for the following:    Hgb urine dipstick TRACE (*)     Nitrite POSITIVE (*)    Leukocytes, UA SMALL (*)    All other components within normal limits  URINE MICROSCOPIC-ADD  ON - Abnormal; Notable for the following:    Squamous Epithelial / LPF FEW (*)    Bacteria, UA FEW (*)    All other components within normal limits  URINE CULTURE  I-STAT CG4 LACTIC ACID, ED  I-STAT TROPOININ, ED  I-STAT CG4 LACTIC ACID, ED    Imaging Review Dg Abd Acute W/chest  08/09/2015   CLINICAL DATA:  Acute onset of generalized abdominal pain and diarrhea. Initial encounter.  EXAM: DG ABDOMEN ACUTE W/ 1V CHEST  COMPARISON:  Chest radiograph performed 05/21/2015, and abdominal radiograph performed 10/02/2012  FINDINGS: The lungs are well-aerated. There is elevation of the right hemidiaphragm. There is no evidence of focal opacification, pleural effusion or pneumothorax. The cardiomediastinal silhouette is within normal limits.  The visualized bowel gas pattern is unremarkable. Scattered fluid and air are seen within the colon; there is no evidence of small bowel dilatation to suggest obstruction. No free intra-abdominal air is identified on the provided upright view.  No acute osseous abnormalities are seen; the sacroiliac joints are unremarkable in appearance.  IMPRESSION: 1. Unremarkable bowel gas pattern; no free intra-abdominal air seen. The colon is partially filled with fluid. 2. Elevation of the right hemidiaphragm. No acute cardiopulmonary process seen.   Electronically Signed   By: Garald Balding M.D.   On: 08/09/2015 22:35   I have personally reviewed and evaluated these images and lab results as part of my medical decision-making.   EKG Interpretation None      MDM   Final diagnoses:  Dysphagia  UTI (lower urinary tract infection)    The patient smells of urine, otherwise she has a benign exam, vital signs are normal, EKG is unremarkable and unchanged, evaluate with labs and a acute abdominal series as the patient states "I'm concerned I have a recurrent  bowel obstruction". She states that she had surgery in the 1940s, I do not have any collateral information to validate this.  ED ECG REPORT  I personally interpreted this EKG   Date: 08/10/2015   Rate: 64  Rhythm: normal sinus rhythm  QRS Axis: normal  Intervals: normal  ST/T Wave abnormalities: normal  Conduction Disutrbances:none  Narrative Interpretation:   Old EKG Reviewed: unchanged   The patient likely has recurrent esophageal abnormalities, currently she is taking pantoprazole, will add Zantac, Keflex for her urinary tract infection and repeat visit with gastroenterology for potential evaluation of recurrent esophageal stricture. This was discussed with the son who is in agreement. The patient has had no abdominal pain since arrival, x-ray show no signs of excessive stool burden or free air or bowel obstruction. We'll place on stool softener. Repeat abdominal exam benign at 11:45 PM  Meds given in ED:  Medications  cephALEXin (KEFLEX) capsule 500 mg (not administered)    New Prescriptions   CEPHALEXIN (KEFLEX) 500 MG CAPSULE    Take 1 capsule (500 mg total) by mouth 4 (four) times daily.   RANITIDINE (ZANTAC) 150 MG TABLET    Take 1 tablet (150 mg total) by mouth 2 (two) times daily.      Noemi Chapel, MD 08/10/15 [phone]

## 2015-08-09 NOTE — ED Notes (Signed)
Patient back in x-ray.

## 2015-08-10 MED ORDER — RANITIDINE HCL 150 MG PO TABS: 150.0000 mg | ORAL_TABLET | Freq: Two times a day (BID) | ORAL | Status: DC

## 2015-08-10 MED ORDER — POLYETHYLENE GLYCOL 3350 17 GM/SCOOP PO POWD: 17.0000 g | Freq: Two times a day (BID) | ORAL | Status: DC

## 2015-08-10 MED ORDER — CEPHALEXIN 500 MG PO CAPS: 500.0000 mg | ORAL_CAPSULE | Freq: Four times a day (QID) | ORAL | Status: DC

## 2015-08-10 NOTE — Discharge Instructions (Signed)
You have been diagnosed with a urinary infection and difficulty swallowing  1.  You must be seen by your GI doctor this week  2.  Start zantac in addition to your pantoprazole for acid reflux  3.  Keflex for urinary infection   4.  Stop Levaquin  5.  Please have your INR (coumadin level rechecked) as these medications may alter the levels of coumadin - this should be checked with in 1 week  Please obtain all of your results from medical records or have your doctors office obtain the results - share them with your doctor - you should be seen at your doctors office in the next 2 days. Call today to arrange your follow up. Take the medications as prescribed. Please review all of the medicines and only take them if you do not have an allergy to them. Please be aware that if you are taking birth control pills, taking other prescriptions, ESPECIALLY ANTIBIOTICS may make the birth control ineffective - if this is the case, either do not engage in sexual activity or use alternative methods of birth control such as condoms until you have finished the medicine and your family doctor says it is OK to restart them. If you are on a blood thinner such as COUMADIN, be aware that any other medicine that you take may cause the coumadin to either work too much, or not enough - you should have your coumadin level rechecked in next 7 days if this is the case.  ?  It is also a possibility that you have an allergic reaction to any of the medicines that you have been prescribed - Everybody reacts differently to medications and while MOST people have no trouble with most medicines, you may have a reaction such as nausea, vomiting, rash, swelling, shortness of breath. If this is the case, please stop taking the medicine immediately and contact your physician.  ?  You should return to the ER if you develop severe or worsening symptoms.

## 2015-08-10 NOTE — ED Notes (Signed)
Pt verbalized understanding of d/c instructions and has no further questions. Pt stable and NAD. Discharged home with son.

## 2015-08-12 LAB — URINE CULTURE: Culture: >=100000

## 2015-08-13 ENCOUNTER — Telehealth (HOSPITAL_BASED_OUTPATIENT_CLINIC_OR_DEPARTMENT_OTHER): Payer: Self-pay | Admitting: Emergency Medicine

## 2015-08-13 NOTE — Telephone Encounter (Signed)
Post ED Visit - Positive Culture Follow-up  Culture report reviewed by antimicrobial stewardship pharmacist:  []  Heide Guile, Pharm.D., BCPS []  Alycia Rossetti, Pharm.D., BCPS []  [place], Pharm.D., BCPS, AAHIVP []  Legrand Como, Pharm.D., BCPS, AAHIVP []  [place], Pharm.D. []  Milus Glazier, [place].D. [place] Alpha PharmD  Positive urine culture Staph Treated with levofloxacin and cephalexin, organism sensitive to the same and no further patient follow-up is required at this time.  Hazle Nordmann 08/13/2015, 9:52 AM

## 2015-11-14 ENCOUNTER — Emergency Department (HOSPITAL_COMMUNITY): Payer: Medicare Other

## 2015-11-14 ENCOUNTER — Encounter (HOSPITAL_COMMUNITY): Payer: Self-pay

## 2015-11-14 ENCOUNTER — Emergency Department (HOSPITAL_COMMUNITY)
Admission: EM | Admit: 2015-11-14 | Discharge: 2015-11-15 | Disposition: A | Discharge status: Home / Self Care | Payer: Medicare Other | Attending: Emergency Medicine | Admitting: Emergency Medicine

## 2015-11-14 DIAGNOSIS — W228XXA Striking against or struck by other objects, initial encounter: Secondary | ICD-10-CM | POA: Diagnosis not present

## 2015-11-14 DIAGNOSIS — Y998 Other external cause status: Secondary | ICD-10-CM | POA: Diagnosis not present

## 2015-11-14 DIAGNOSIS — S8992XA Unspecified injury of left lower leg, initial encounter: Secondary | ICD-10-CM | POA: Diagnosis present

## 2015-11-14 DIAGNOSIS — Z7982 Long term (current) use of aspirin: Secondary | ICD-10-CM | POA: Diagnosis not present

## 2015-11-14 DIAGNOSIS — F0390 Unspecified dementia without behavioral disturbance: Secondary | ICD-10-CM | POA: Insufficient documentation

## 2015-11-14 DIAGNOSIS — F419 Anxiety disorder, unspecified: Secondary | ICD-10-CM | POA: Diagnosis not present

## 2015-11-14 DIAGNOSIS — S60222A Contusion of left hand, initial encounter: Secondary | ICD-10-CM | POA: Diagnosis not present

## 2015-11-14 DIAGNOSIS — K219 Gastro-esophageal reflux disease without esophagitis: Secondary | ICD-10-CM | POA: Diagnosis not present

## 2015-11-14 DIAGNOSIS — Z9861 Coronary angioplasty status: Secondary | ICD-10-CM | POA: Insufficient documentation

## 2015-11-14 DIAGNOSIS — Y9389 Activity, other specified: Secondary | ICD-10-CM | POA: Diagnosis not present

## 2015-11-14 DIAGNOSIS — Z8669 Personal history of other diseases of the nervous system and sense organs: Secondary | ICD-10-CM | POA: Diagnosis not present

## 2015-11-14 DIAGNOSIS — Z8739 Personal history of other diseases of the musculoskeletal system and connective tissue: Secondary | ICD-10-CM | POA: Diagnosis not present

## 2015-11-14 DIAGNOSIS — S20212A Contusion of left front wall of thorax, initial encounter: Secondary | ICD-10-CM | POA: Diagnosis not present

## 2015-11-14 DIAGNOSIS — Z880 Allergy status to penicillin: Secondary | ICD-10-CM | POA: Insufficient documentation

## 2015-11-14 DIAGNOSIS — S8002XA Contusion of left knee, initial encounter: Secondary | ICD-10-CM | POA: Diagnosis not present

## 2015-11-14 DIAGNOSIS — W19XXXA Unspecified fall, initial encounter: Secondary | ICD-10-CM

## 2015-11-14 DIAGNOSIS — Y92000 Kitchen of unspecified non-institutional (private) residence as  the place of occurrence of the external cause: Secondary | ICD-10-CM | POA: Insufficient documentation

## 2015-11-14 DIAGNOSIS — Z7901 Long term (current) use of anticoagulants: Secondary | ICD-10-CM | POA: Insufficient documentation

## 2015-11-14 DIAGNOSIS — Z86711 Personal history of pulmonary embolism: Secondary | ICD-10-CM | POA: Insufficient documentation

## 2015-11-14 DIAGNOSIS — I2510 Atherosclerotic heart disease of native coronary artery without angina pectoris: Secondary | ICD-10-CM | POA: Diagnosis not present

## 2015-11-14 DIAGNOSIS — Z872 Personal history of diseases of the skin and subcutaneous tissue: Secondary | ICD-10-CM | POA: Diagnosis not present

## 2015-11-14 DIAGNOSIS — Z79899 Other long term (current) drug therapy: Secondary | ICD-10-CM | POA: Diagnosis not present

## 2015-11-14 DIAGNOSIS — Z859 Personal history of malignant neoplasm, unspecified: Secondary | ICD-10-CM | POA: Diagnosis not present

## 2015-11-14 DIAGNOSIS — Z86718 Personal history of other venous thrombosis and embolism: Secondary | ICD-10-CM | POA: Diagnosis not present

## 2015-11-14 DIAGNOSIS — Z792 Long term (current) use of antibiotics: Secondary | ICD-10-CM | POA: Insufficient documentation

## 2015-11-14 LAB — CBC WITH DIFFERENTIAL/PLATELET
Basophils Absolute: 0 10*3/uL (ref 0.0–0.1)
Basophils Relative: 1 %
EOS PCT: 1 %
Eosinophils Absolute: 0.1 10*3/uL (ref 0.0–0.7)
HCT: 37.4 % (ref 36.0–46.0)
Hemoglobin: 11.7 g/dL — ABNORMAL LOW (ref 12.0–15.0)
LYMPHS ABS: 1.6 10*3/uL (ref 0.7–4.0)
LYMPHS PCT: 19 %
MCH: 28.4 pg (ref 26.0–34.0)
MCHC: 31.3 g/dL (ref 30.0–36.0)
MCV: 90.8 fL (ref 78.0–100.0)
MONO ABS: 0.6 10*3/uL (ref 0.1–1.0)
MONOS PCT: 7 %
Neutro Abs: 5.9 10*3/uL (ref 1.7–7.7)
Neutrophils Relative %: 72 %
PLATELETS: 251 10*3/uL (ref 150–400)
RBC: 4.12 MIL/uL (ref 3.87–5.11)
RDW: 14.9 % (ref 11.5–15.5)
WBC: 8.2 10*3/uL (ref 4.0–10.5)

## 2015-11-14 LAB — BASIC METABOLIC PANEL
Anion gap: 9 (ref 5–15)
BUN: 18 mg/dL (ref 6–20)
CALCIUM: 9 mg/dL (ref 8.9–10.3)
CO2: 24 mmol/L (ref 22–32)
Chloride: 110 mmol/L (ref 101–111)
Creatinine, Ser: 1.22 mg/dL — ABNORMAL HIGH (ref 0.44–1.00)
GFR calc Af Amer: 43 mL/min — ABNORMAL LOW (ref >60)
GFR, EST NON AFRICAN AMERICAN: 37 mL/min — AB (ref >60)
GLUCOSE: 104 mg/dL — AB (ref 65–99)
POTASSIUM: 4.5 mmol/L (ref 3.5–5.1)
Sodium: 143 mmol/L (ref 135–145)

## 2015-11-14 LAB — PROTIME-INR
INR: 2.14 — ABNORMAL HIGH (ref 0.00–1.49)
Prothrombin Time: 23.7 seconds — ABNORMAL HIGH (ref 11.6–15.2)

## 2015-11-14 NOTE — ED Provider Notes (Signed)
CSN: [id number]     Arrival date & time 11/14/15  1934 History   First MD Initiated Contact with Patient 11/14/15 1943     Chief Complaint  Patient presents with  . Fall     (Consider location/radiation/quality/duration/timing/severity/associated sxs/prior Treatment) HPI   82 y F w PMH CAD, HTN, PE, dementia, on coumadin who lives at home and fell tonight. Patient was at the refridgerator when she turned around and lost her balance causing her to fall.  She fell on her L side and hit her L knee and L hand as well as her L chest.  No head injury, no LOC, no lightheadedness, no chest pain.  She is having pain in the L chest wall, L knee and L hand.  She denies headache, nausea/vomiting.  Past Medical History  Diagnosis Date  . CAD (coronary artery disease)     S/p PTCA / stenting (last cath 2004, multiple LAD stents, 2 stents in the right coronary artery all patent)  . 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 Started coumadin  . HTN (hypertension)   . Restless leg syndrome   . PMR (polymyalgia rheumatica) (HCC)   . Axillary abscess   . Anxiety   . Dementia   . Complication of anesthesia   . PONV (postoperative nausea and vomiting)   . GERD (gastroesophageal reflux disease)   . Cancer [facility])    Past Surgical History  Procedure Laterality Date  . Colectomy    . Stent      cardiac x 8 stents.  . Incision and drainage      bilateral axillary, non specific staff  . Incision and drainage abscess  09/28/2012    Procedure: INCISION AND DRAINAGE ABSCESS;  Surgeon: Zenovia Jarred, MD;  Location: [place];  Service: General;  Laterality: Bilateral;  . Esophagogastroduodenoscopy (egd) with esophageal dilation  10/10/2012    Procedure: ESOPHAGOGASTRODUODENOSCOPY (EGD) WITH ESOPHAGEAL DILATION;  Surgeon: Inda Castle, MD;  Location: [place];  Service: Endoscopy;  Laterality: N/A;  . Breast lumpectomy    . Heart stents  x 8   No family history on file. Social History   Substance Use Topics  . Smoking status: Never Smoker   . Smokeless tobacco: Never Used  . Alcohol Use: No   OB History    No data available     Review of Systems  Constitutional: Negative for fever and chills.  HENT: Negative for nosebleeds.   Eyes: Negative for visual disturbance.  Respiratory: Negative for cough and shortness of breath.   Cardiovascular: Positive for chest pain (L chest wall).  Gastrointestinal: Negative for nausea, vomiting, abdominal pain, diarrhea and constipation.  Genitourinary: Negative for dysuria.  Musculoskeletal:       L knee and L hand pain  Skin: Negative for rash.  Neurological: Negative for weakness.  All other systems reviewed and are negative.     Allergies  Ambien; Codeine phosphate; Darvocet; Promethazine hcl; Sulfamethoxazole; Vicodin; Amoxicillin; and Penicillins  Home Medications   Prior to Admission medications   Medication Sig Start Date End Date Taking? Authorizing Provider  acetaminophen (TYLENOL) 500 MG tablet Take 500 mg by mouth 3 (three) times daily as needed for moderate pain (NOT MORE THAN 3,000 MG IN 24 HOUR PERIOD). For pain   Yes Historical Provider, MD  amLODipine (NORVASC) 10 MG tablet Take 10 mg by mouth every morning.    Yes Historical Provider, MD  aspirin EC 81 MG tablet Take 1 tablet (  81 mg total) by mouth daily. 03/01/14  Yes Belkys A Regalado, MD  atorvastatin (LIPITOR) 20 MG tablet Take 10 mg by mouth every evening.    Yes Historical Provider, MD  benzonatate (TESSALON PERLES) 100 MG capsule Take 100 mg by mouth 3 (three) times daily as needed for cough.   Yes Historical Provider, MD  cholecalciferol (VITAMIN D) 1000 UNITS tablet Take 2,000 Units by mouth daily with lunch.    Yes Historical Provider, MD  cholestyramine light (PREVALITE) 4 G packet Take 4 g by mouth daily as needed (for loose bowels).   Yes Historical Provider, MD  donepezil (ARICEPT) 10 MG tablet Take 10 mg by mouth every morning.   Yes Historical  Provider, MD  ferrous sulfate 325 (65 FE) MG EC tablet Take 325 mg by mouth daily with breakfast.   Yes Historical Provider, MD  fluticasone (FLONASE) 50 MCG/ACT nasal spray Place 1 spray into both nostrils daily as needed for allergies or rhinitis.   Yes Historical Provider, MD  ipratropium (ATROVENT) 0.03 % nasal spray Place 2 sprays into both nostrils 3 (three) times daily as needed for rhinitis.   Yes Historical Provider, MD  loratadine (CLARITIN) 10 MG tablet Take 10 mg by mouth daily as needed for allergies.   Yes Historical Provider, MD  losartan (COZAAR) 100 MG tablet Take 50 mg by mouth at bedtime.    Yes Historical Provider, MD  mirabegron ER (MYRBETRIQ) 25 MG TB24 Take 25 mg by mouth every evening.    Yes Historical Provider, MD  mupirocin ointment (BACTROBAN) 2 % Apply 1 application topically daily with lunch.    Yes Historical Provider, MD  nitroGLYCERIN (NITROSTAT) 0.4 MG SL tablet Place 0.4 mg under the tongue every 5 (five) minutes as needed for chest pain.   Yes Historical Provider, MD  pantoprazole (PROTONIX) 40 MG tablet Take 40 mg by mouth 2 (two) times daily as needed (for heartburn).   Yes Historical Provider, MD  Pediatric Multiple Vit-C-FA (CHILDRENS CHEWABLE MULTI VITS PO) Take 1 tablet by mouth daily with lunch.    Yes Historical Provider, MD  polyethylene glycol powder (GLYCOLAX/MIRALAX) powder Take 17 g by mouth 2 (two) times daily. Until daily soft stools  OTC 08/10/15  Yes Noemi Chapel, MD  potassium chloride SA (K-DUR,KLOR-CON) 20 MEQ tablet Take 20 mEq by mouth daily with lunch.    Yes Historical Provider, MD  ranitidine (ZANTAC) 150 MG tablet Take 1 tablet (150 mg total) by mouth 2 (two) times daily. 08/10/15  Yes Noemi Chapel, MD  rOPINIRole (REQUIP) 1 MG tablet Take 1 mg by mouth 3 (three) times daily. 1mg  at noon, 1mg  2 hours before bed, then 1mg  at bedtime   Yes Historical Provider, MD  sertraline (ZOLOFT) 25 MG tablet Take 25 mg by mouth daily.   Yes Historical  Provider, MD  warfarin (COUMADIN) 2.5 MG tablet Take 2.5 mg by mouth every evening.    Yes Historical Provider, MD   BP 151/91 mmHg  Pulse 72  Temp(Src) 97.5 F (36.4 C) (Oral)  Resp 18  SpO2 99% Physical Exam  Constitutional: She is oriented to person, place, and time. No distress.  HENT:  Head: Normocephalic and atraumatic.  Eyes: EOM are normal. Pupils are equal, round, and reactive to light.  Neck: Normal range of motion. Neck supple.  Cardiovascular: Normal rate and intact distal pulses.   Pulmonary/Chest: No respiratory distress. She exhibits tenderness (L lower chest wall).  Abdominal: Soft. She exhibits no distension. There is  no tenderness.  Musculoskeletal: Normal range of motion.  Contusion around the L knee.  The L foot is NVI L hand has a small hematoma on the posterior surface.  L hand is NVI.  Skin closed.   Mild ttp over the c/t spine.  Patient has normal motor/sensation in bilateral upper/lower extremities.  Neurological: She is alert and oriented to person, place, and time.  Skin: No rash noted. She is not diaphoretic.  Psychiatric: She has a normal mood and affect.    ED Course  Procedures (including critical care time) Labs Review Labs Reviewed  CBC WITH DIFFERENTIAL/PLATELET - Abnormal; Notable for the following:    Hemoglobin 11.7 (*)    All other components within normal limits  BASIC METABOLIC PANEL - Abnormal; Notable for the following:    Glucose, Bld 104 (*)    Creatinine, Ser 1.22 (*)    GFR calc non Af Amer 37 (*)    GFR calc Af Amer 43 (*)    All other components within normal limits  PROTIME-INR - Abnormal; Notable for the following:    Prothrombin Time 23.7 (*)    INR 2.14 (*)    All other components within normal limits    Imaging Review Dg Chest 2 View  11/14/2015  CLINICAL DATA:  82 year old female with fall and left-sided chest pain EXAM: CHEST  2 VIEW COMPARISON:  Radiograph dated 08/09/2015 FINDINGS: Two views of the chest do not  demonstrate a focal consolidation. There is no pleural effusion or pneumothorax. Stable eventration of the right hemidiaphragm. The cardiac silhouette is within normal limits. The osseous structures appear unremarkable. IMPRESSION: No active cardiopulmonary disease. Electronically Signed   By: Anner Crete M.D.   On: 11/14/2015 23:57   Ct Head Wo Contrast  11/14/2015  CLINICAL DATA:  Patient fell and hit the left side of her head and face on the side of the counter. EXAM: CT HEAD WITHOUT CONTRAST CT CERVICAL SPINE WITHOUT CONTRAST TECHNIQUE: Multidetector CT imaging of the head and cervical spine was performed following the standard protocol without intravenous contrast. Multiplanar CT image reconstructions of the cervical spine were also generated. COMPARISON:  CT 08/28/2011 brain MRI 05/08/2009. FINDINGS: CT HEAD FINDINGS No intracranial hemorrhage. No pulmonary parenchymal contusion. No midline shift or mass effect. There is generalized cortical atrophy and proportional ventricular dilatation. Moderate periventricular white matter hypodensities. High-density lesion in the sella turcica suprasellar cistern measuring 8 mm is unchanged prior. No skullbase fracture. No fluid the paranasal sinuses orbits are normal. New CT CERVICAL SPINE FINDINGS No prevertebral soft tissue swelling. Normal alignment of cervical vertebral bodies. No loss of vertebral body height. Normal facet articulation. Normal craniocervical junction. No evidence epidural or paraspinal hematoma. IMPRESSION: 1. No intracranial trauma. 2. Atrophy and microvascular disease. 3. Stable suprasellar lesion compared to prior MRI. 4. No cervical spine fracture. Electronically Signed   By: Suzy Bouchard M.D.   On: 11/14/2015 20:53   Ct Chest Wo Contrast  11/15/2015  CLINICAL DATA:  82 year old female with fall and concern for rib fracture. EXAM: CT CHEST WITHOUT CONTRAST TECHNIQUE: Multidetector CT imaging of the chest was performed following  the standard protocol without IV contrast. COMPARISON:  Radiograph dated 11/14/2015 FINDINGS: Evaluation of this exam is limited in the absence of intravenous contrast. The lungs are clear. Minimal bibasilar linear atelectasis/scarring. There is no focal consolidation, pleural effusion, or pneumothorax. The central airways are patent. There is atherosclerotic calcification of the thoracic aorta. There is prominence of the main pulmonary trunk  compatible with a degree of pulmonary hypertension. There is no cardiomegaly or pericardial effusion. There is coronary vascular calcification. There is hypoattenuation of cardiac blood pool compatible with a degree of anemia. There is no hilar or mediastinal adenopathy. The esophagus appears unremarkable. No thyroid nodule identified. There is no axillary adenopathy. The chest wall soft tissues appear unremarkable. There is osteopenia with degenerative changes of the spine. There are old compression fractures of T9, T12, and L3. The visualized upper abdomen is grossly unremarkable with No acute fracture. IMPRESSION: No acute/ traumatic thoracic pathology. Electronically Signed   By: Anner Crete M.D.   On: 11/15/2015 00:13   Ct Cervical Spine Wo Contrast  11/14/2015  CLINICAL DATA:  Patient fell and hit the left side of her head and face on the side of the counter. EXAM: CT HEAD WITHOUT CONTRAST CT CERVICAL SPINE WITHOUT CONTRAST TECHNIQUE: Multidetector CT imaging of the head and cervical spine was performed following the standard protocol without intravenous contrast. Multiplanar CT image reconstructions of the cervical spine were also generated. COMPARISON:  CT 08/28/2011 brain MRI 05/08/2009. FINDINGS: CT HEAD FINDINGS No intracranial hemorrhage. No pulmonary parenchymal contusion. No midline shift or mass effect. There is generalized cortical atrophy and proportional ventricular dilatation. Moderate periventricular white matter hypodensities. High-density lesion in  the sella turcica suprasellar cistern measuring 8 mm is unchanged prior. No skullbase fracture. No fluid the paranasal sinuses orbits are normal. New CT CERVICAL SPINE FINDINGS No prevertebral soft tissue swelling. Normal alignment of cervical vertebral bodies. No loss of vertebral body height. Normal facet articulation. Normal craniocervical junction. No evidence epidural or paraspinal hematoma. IMPRESSION: 1. No intracranial trauma. 2. Atrophy and microvascular disease. 3. Stable suprasellar lesion compared to prior MRI. 4. No cervical spine fracture. Electronically Signed   By: Suzy Bouchard M.D.   On: 11/14/2015 20:53   Ct Thoracic Spine Wo Contrast  11/14/2015  CLINICAL DATA:  82 year old female with fall. EXAM: CT THORACIC SPINE WITHOUT CONTRAST TECHNIQUE: Multidetector CT imaging of the thoracic spine was performed without intravenous contrast administration. Multiplanar CT image reconstructions were also generated. COMPARISON:  Chest radiograph dated 08/09/2015 and CT dated 01/20/2014 FINDINGS: There is a old T9 compression fracture with approximately 50% loss of vertebral body height. There is an old compression fracture of the superior endplate of the [id number] vertebra. Partially visualized old-appearing compression deformity of the superior endplate of the [id number] is also noted. There is aortoiliac atherosclerotic disease. There is coronary vascular calcification. The visualized lungs are clear. IMPRESSION: No acute/traumatic thoracic spine pathology. Electronically Signed   By: Anner Crete M.D.   On: 11/14/2015 20:58   Dg Knee Complete 4 Views Left  11/14/2015  CLINICAL DATA:  Fall today at home, left knee pain, swelling and bruising. EXAM: LEFT KNEE - COMPLETE 4+ VIEW COMPARISON:  None. FINDINGS: No fracture of the proximal tibia or distal femur. Patella is normal. No joint effusion. IMPRESSION: No fracture or dislocation. Electronically Signed   By: Suzy Bouchard M.D.   On: 11/14/2015 20:57    I have personally reviewed and evaluated these images and lab results as part of my medical decision-making.   EKG Interpretation None      MDM   Final diagnoses:  Fall, initial encounter  Contusion of left knee, initial encounter  Contusion, chest wall, left, initial encounter  Traumatic hematoma of left hand, initial encounter    51 y F w PMH CAD, HTN, PE, dementia, on coumadin who lives at home and fell tonight.  Fall sounds mechanical on hx.  She hit the L knee, L hand and L chest wall.  Pt is on coumadin but has no obvious head trauma.  No concern for syncope or cardiogenic etiology of the fall.  She has no infectious sx.  Will obtain ct head/c spine/t spine and ct chest as well as cxr to eval for traumatic injuries.  Will obtain cbc/bmp and inr given that she is on coumadin.  All imaging neg.  Cbc/bmp with mild aki on ckd since October with creatinine today of 1.22, will have her f/u with pcp to further evaluate.  I have placed a pressure dressing over the hematoma on the dorsum of the L hand and will have her son who accompanies her remove it tomorrow.  Patient able to ambulate in the ED with minimal assistance though she does use a walker at home.  Will try to arrange for outpatient home health and pt referrals and will have her f/u with pcp.    I have discussed the results, Dx and Tx plan with the patient and her son. They expressed understanding and agree with the plan and were told to return to ED with any worsening of condition or concern.    Disposition: Discharge  Condition: Good  Discharge Medication List as of 11/14/2015 10:24 PM      Follow Up: Deland Pretty, MD [place] [address] [phone]      Pt seen in conjunction with Dr. Laruth Bouchard, MD 11/15/15 [id number]  Gareth Morgan, MD 11/15/15 1521

## 2015-11-14 NOTE — Discharge Instructions (Signed)
Fall Prevention in the Home  Falls can cause injuries and can affect people from all age groups. There are many simple things that you can do to make your home safe and to help prevent falls. WHAT CAN I DO ON THE OUTSIDE OF MY HOME?  Regularly repair the edges of walkways and driveways and fix any cracks.  Remove high doorway thresholds.  Trim any shrubbery on the main path into your home.  Use bright outdoor lighting.  Clear walkways of debris and clutter, including tools and rocks.  Regularly check that handrails are securely fastened and in good repair. Both sides of any steps should have handrails.  Install guardrails along the edges of any raised decks or porches.  Have leaves, snow, and ice cleared regularly.  Use sand or salt on walkways during winter months.  In the garage, clean up any spills right away, including grease or oil spills. WHAT CAN I DO IN THE BATHROOM?  Use night lights.  Install grab bars by the toilet and in the tub and shower. Do not use towel bars as grab bars.  Use non-skid mats or decals on the floor of the tub or shower.  If you need to sit down while you are in the shower, use a plastic, non-slip stool..  Keep the floor dry. Immediately clean up any water that spills on the floor.  Remove soap buildup in the tub or shower on a regular basis.  Attach bath mats securely with double-sided non-slip rug tape.  Remove throw rugs and other tripping hazards from the floor. WHAT CAN I DO IN THE BEDROOM?  Use night lights.  Make sure that a bedside light is easy to reach.  Do not use oversized bedding that drapes onto the floor.  Have a firm chair that has side arms to use for getting dressed.  Remove throw rugs and other tripping hazards from the floor. WHAT CAN I DO IN THE KITCHEN?   Clean up any spills right away.  Avoid walking on wet floors.  Place frequently used items in easy-to-reach places.  If you need to reach for something  above you, use a sturdy step stool that has a grab bar.  Keep electrical cables out of the way.  Do not use floor polish or wax that makes floors slippery. If you have to use wax, make sure that it is non-skid floor wax.  Remove throw rugs and other tripping hazards from the floor. WHAT CAN I DO IN THE STAIRWAYS?  Do not leave any items on the stairs.  Make sure that there are handrails on both sides of the stairs. Fix handrails that are broken or loose. Make sure that handrails are as long as the stairways.  Check any carpeting to make sure that it is firmly attached to the stairs. Fix any carpet that is loose or worn.  Avoid having throw rugs at the top or bottom of stairways, or secure the rugs with carpet tape to prevent them from moving.  Make sure that you have a light switch at the top of the stairs and the bottom of the stairs. If you do not have them, have them installed. WHAT ARE SOME OTHER FALL PREVENTION TIPS?  Wear closed-toe shoes that fit well and support your feet. Wear shoes that have rubber soles or low heels.  When you use a stepladder, make sure that it is completely opened and that the sides are firmly locked. Have someone hold the ladder while you   are using it. Do not climb a closed stepladder.  Add color or contrast paint or tape to grab bars and handrails in your home. Place contrasting color strips on the first and last steps.  Use mobility aids as needed, such as canes, walkers, scooters, and crutches.  Turn on lights if it is dark. Replace any light bulbs that burn out.  Set up furniture so that there are clear paths. Keep the furniture in the same spot.  Fix any uneven floor surfaces.  Choose a carpet design that does not hide the edge of steps of a stairway.  Be aware of any and all pets.  Review your medicines with your healthcare provider. Some medicines can cause dizziness or changes in blood pressure, which increase your risk of falling. Talk  with your health care provider about other ways that you can decrease your risk of falls. This may include working with a physical therapist or trainer to improve your strength, balance, and endurance.   This information is not intended to replace advice given to you by your health care provider. Make sure you discuss any questions you have with your health care provider.   Document Released: 10/08/2002 Document Revised: 03/04/2015 Document Reviewed: 11/22/2014 Elsevier Interactive Patient Education 2016 [employer]  

## 2015-11-14 NOTE — ED Notes (Signed)
Pt comes from home via [facility], pt had fall in her kitchen, swelling and bruising to L knee and hematoma to L hand, son reports that pt hit head, no LOC.  Pt is on coumadin.

## 2015-11-14 NOTE — ED Notes (Signed)
Patient transported to X-ray 

## 2016-01-20 DIAGNOSIS — Z86718 Personal history of other venous thrombosis and embolism: Secondary | ICD-10-CM | POA: Diagnosis not present

## 2016-01-20 DIAGNOSIS — Z7901 Long term (current) use of anticoagulants: Secondary | ICD-10-CM | POA: Diagnosis not present

## 2016-02-24 DIAGNOSIS — Z7901 Long term (current) use of anticoagulants: Secondary | ICD-10-CM | POA: Diagnosis not present

## 2016-02-24 DIAGNOSIS — Z86718 Personal history of other venous thrombosis and embolism: Secondary | ICD-10-CM | POA: Diagnosis not present

## 2016-02-24 DIAGNOSIS — N3946 Mixed incontinence: Secondary | ICD-10-CM | POA: Diagnosis not present

## 2016-03-25 DIAGNOSIS — N83202 Unspecified ovarian cyst, left side: Secondary | ICD-10-CM | POA: Diagnosis not present

## 2016-03-30 DIAGNOSIS — Z86718 Personal history of other venous thrombosis and embolism: Secondary | ICD-10-CM | POA: Diagnosis not present

## 2016-03-30 DIAGNOSIS — N3946 Mixed incontinence: Secondary | ICD-10-CM | POA: Diagnosis not present

## 2016-03-30 DIAGNOSIS — J309 Allergic rhinitis, unspecified: Secondary | ICD-10-CM | POA: Diagnosis not present

## 2016-03-30 DIAGNOSIS — Z7901 Long term (current) use of anticoagulants: Secondary | ICD-10-CM | POA: Diagnosis not present

## 2016-04-07 NOTE — Progress Notes (Signed)
Error.  Pt cancelled appt.

## 2016-04-08 ENCOUNTER — Ambulatory Visit: Payer: Medicare Other | Admitting: Gynecologic Oncology

## 2016-04-12 ENCOUNTER — Ambulatory Visit: Payer: Medicare Other | Admitting: Gynecologic Oncology

## 2016-04-26 DIAGNOSIS — Z7901 Long term (current) use of anticoagulants: Secondary | ICD-10-CM | POA: Diagnosis not present

## 2016-04-26 DIAGNOSIS — Z86718 Personal history of other venous thrombosis and embolism: Secondary | ICD-10-CM | POA: Diagnosis not present

## 2016-05-03 ENCOUNTER — Encounter: Payer: Self-pay | Admitting: Gynecologic Oncology

## 2016-05-03 ENCOUNTER — Ambulatory Visit: Payer: Medicare Other | Attending: Gynecologic Oncology | Admitting: Gynecologic Oncology

## 2016-05-03 VITALS — BP 135/62 | HR 89 | Temp 97.6°F | Resp 16 | Ht 62.0 in | Wt 137.4 lb

## 2016-05-03 DIAGNOSIS — G2581 Restless legs syndrome: Secondary | ICD-10-CM | POA: Diagnosis not present

## 2016-05-03 DIAGNOSIS — N83202 Unspecified ovarian cyst, left side: Secondary | ICD-10-CM | POA: Diagnosis not present

## 2016-05-03 DIAGNOSIS — Z7901 Long term (current) use of anticoagulants: Secondary | ICD-10-CM | POA: Diagnosis not present

## 2016-05-03 DIAGNOSIS — K219 Gastro-esophageal reflux disease without esophagitis: Secondary | ICD-10-CM | POA: Diagnosis not present

## 2016-05-03 DIAGNOSIS — I2510 Atherosclerotic heart disease of native coronary artery without angina pectoris: Secondary | ICD-10-CM | POA: Insufficient documentation

## 2016-05-03 DIAGNOSIS — Z885 Allergy status to narcotic agent status: Secondary | ICD-10-CM | POA: Insufficient documentation

## 2016-05-03 DIAGNOSIS — M353 Polymyalgia rheumatica: Secondary | ICD-10-CM | POA: Insufficient documentation

## 2016-05-03 DIAGNOSIS — I10 Essential (primary) hypertension: Secondary | ICD-10-CM | POA: Diagnosis not present

## 2016-05-03 DIAGNOSIS — Z86711 Personal history of pulmonary embolism: Secondary | ICD-10-CM | POA: Diagnosis not present

## 2016-05-03 DIAGNOSIS — Z7982 Long term (current) use of aspirin: Secondary | ICD-10-CM | POA: Diagnosis not present

## 2016-05-03 DIAGNOSIS — N838 Other noninflammatory disorders of ovary, fallopian tube and broad ligament: Secondary | ICD-10-CM | POA: Diagnosis not present

## 2016-05-03 DIAGNOSIS — Z86718 Personal history of other venous thrombosis and embolism: Secondary | ICD-10-CM | POA: Diagnosis not present

## 2016-05-03 DIAGNOSIS — F0390 Unspecified dementia without behavioral disturbance: Secondary | ICD-10-CM | POA: Insufficient documentation

## 2016-05-03 DIAGNOSIS — F419 Anxiety disorder, unspecified: Secondary | ICD-10-CM | POA: Insufficient documentation

## 2016-05-03 NOTE — Progress Notes (Signed)
Consult Note: Gyn-Onc  Consult was requested by Dr. Radene Knee for the evaluation of [name] [name] 82 y.o. female  CC:  Chief Complaint  Patient presents with  . enlarged ovary    New Consultation    Assessment/Plan:  [name]. [name] [name]  is a 82 y.o.  year old with an asymptomatic 6cm benign appearing cyst.  I discussed with [name] [name] and her son that I believe the cyst is benign. It appears that the growth in the cyst over the past year are subtle and not concerning for malignant growth. CA 125 is reassuring and normal. Additionally, at her advanced age and underlying comorbidities (history of CAD, dementia, on anti-coagulation) she poses a very high surgical risk. Therefore surgery should not be entertained unless for symptomatic purposes.  Therefore I am recommending no further follow-up or workup of the cyst. She does not require future US's or CA 125 evaluations unless she develops new severe pelvic pain symptoms such that a surgical intervention would be justified. [name] [name] and her son are not interested in pursuing treatment for ovarian cancer, should one develop, and therefore there is no reason to monitor with surveillance imaging to attempt to detect an earlier stage malignancy.  [name] [name] expressed understanding and agreement with the plan. They will follow-up with me on a prn basis.   HPI: [name] [name] is a 82 year old parous woman who is seen in consultation at the request of Dr Radene Knee for a left ovarian cyst. The patient has dementia and is seen in the presence of her son who assisted with the provision of history.  The patient was undergoing a routine evaluation in 2016 when a pelvic exam was performed an ultrasound which confirmed a 5 cm ovarian cyst. The ultrasound scan on 09/30/2015 showed a normal uterus measuring 4.5 x 2.2 x 3 cm and a left adnexal cyst measuring 5.5 x 5 x 4.4 cm. It contained 1 septation. No fluid was seen in the cul-de-sac or adjacent to the cyst.  A repeat ultrasound was performed on [name] [name] and this confirmed the cyst measured 5.4 x 4.9 x 4.4 cm with a single septation but otherwise simple. It appeared to have remained stable. I repeat ultrasound scan on 05/25/[name] revealed dimensions of the cyst to be 6.1 x 4.9 x 5.5 cm with a single septation, no flow to the cyst. Due to the subtle increase in dimensions from 5.4-6.1 cm Dr. Radene Knee requested the patient be evaluated for possible underlying malignancy. Of note CA-125 which are being drawn in November, 2016 was normal at 14.   The patient has a history of CAD, dementia, she is on coumadin for anticoagulation. 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 She lives with her son. The cyst is asymptomatic. She has no pain.  Current Meds:  Outpatient Encounter Prescriptions as of 7/3/[name]  Medication Sig  . acetaminophen (TYLENOL) 500 MG tablet Take 500 mg by mouth 3 (three) times daily as needed for moderate pain (NOT MORE THAN 3,000 MG IN 24 HOUR PERIOD). For pain  . amLODipine (NORVASC) 10 MG tablet Take 10 mg by mouth every morning.   Marland Kitchen aspirin EC 81 MG tablet Take 1 tablet (81 mg total) by mouth daily.  Marland Kitchen atorvastatin (LIPITOR) 20 MG tablet Take 10 mg by mouth every evening.   . benzonatate (TESSALON PERLES) 100 MG capsule Take 100 mg by mouth 3 (three) times daily  as needed for cough.  . cholecalciferol (VITAMIN D) 1000 UNITS tablet Take 2,000 Units by mouth daily with lunch.   . cholestyramine light (PREVALITE) 4 G packet Take 4 g by mouth daily as needed (for loose bowels).  . donepezil (ARICEPT) 10 MG tablet Take 10 mg by mouth every morning.  . ferrous sulfate 325 (65 FE) MG EC tablet Take 325 mg by mouth daily with breakfast.  . fluticasone (FLONASE) 50 MCG/ACT nasal spray Place 1 spray into both nostrils daily as needed for allergies or rhinitis.  Marland Kitchen losartan (COZAAR) 100 MG tablet Take 50 mg by mouth at bedtime.   .  mupirocin ointment (BACTROBAN) 2 % Apply 1 application topically daily with lunch.   . nitroGLYCERIN (NITROSTAT) 0.4 MG SL tablet Place 0.4 mg under the tongue every 5 (five) minutes as needed for chest pain.  Marland Kitchen oxybutynin (DITROPAN) 5 MG tablet   . pantoprazole (PROTONIX) 40 MG tablet Take 40 mg by mouth 2 (two) times daily as needed (for heartburn).  . Pediatric Multiple Vit-C-FA (CHILDRENS CHEWABLE MULTI VITS PO) Take 1 tablet by mouth daily with lunch.   . potassium chloride SA (K-DUR,KLOR-CON) 20 MEQ tablet Take 20 mEq by mouth daily with lunch.   . predniSONE (DELTASONE) 1 MG tablet   . rOPINIRole (REQUIP) 1 MG tablet Take 1 mg by mouth 3 (three) times daily. 1mg  at noon, 1mg  2 hours before bed, then 1mg  at bedtime  . sertraline (ZOLOFT) 25 MG tablet Take 25 mg by mouth daily.  Marland Kitchen SILENOR 6 MG TABS   . warfarin (COUMADIN) 2.5 MG tablet Take 2.5 mg by mouth every evening.   Marland Kitchen ipratropium (ATROVENT) 0.03 % nasal spray Place 2 sprays into both nostrils 3 (three) times daily as needed for rhinitis.  Marland Kitchen loratadine (CLARITIN) 10 MG tablet Take 10 mg by mouth daily as needed for allergies. Reported on 7/3/[name]  . LORazepam (ATIVAN) 0.5 MG tablet   . losartan (COZAAR) 50 MG tablet Take by mouth.  . mirabegron ER (MYRBETRIQ) 25 MG TB24 Take 25 mg by mouth every evening.   . polyethylene glycol powder (GLYCOLAX/MIRALAX) powder Take 17 g by mouth 2 (two) times daily. Until daily soft stools  OTC  . ranitidine (ZANTAC) 150 MG tablet Take 1 tablet (150 mg total) by mouth 2 (two) times daily. (Patient not taking: Reported on 7/3/[name])   No facility-administered encounter medications on file as of 7/3/[name].    Allergy:  Allergies  Allergen Reactions  . Ambien [Zolpidem Tartrate] Nausea And Vomiting  . Codeine Phosphate Nausea And Vomiting  . Darvocet [Propoxyphene N-Acetaminophen] Nausea And Vomiting  . Promethazine Hcl Nausea And Vomiting  . Sulfamethoxazole Other (See Comments)    Pt doesn't  remember reaction  . Vicodin [Hydrocodone-Acetaminophen] Other (See Comments)    Unknown reaction  . Amoxicillin Nausea And Vomiting and Rash  . Penicillins Nausea And Vomiting and Rash    Social Hx:   Social History   Social History  . Marital Status: Widowed    Spouse Name: N/A  . Number of Children: N/A  . Years of Education: N/A   Occupational History  . retired    Social History Main Topics  . Smoking status: Never Smoker   . Smokeless tobacco: Never Used  . Alcohol Use: No  . Drug Use: No  . Sexual Activity: Not on file   Other Topics Concern  . Not on file   Social History Narrative   She lives at home with  her son and is still ambulatory daily with a cane or walker. She denies any history of smoking. Marland Kitchen.Mother died at 20 from a myocardial infarction.  Father    died at 28 from a stroke, possible history of PE. She has nine brothers and    sisters, one sister with bypass and a brother with bypass, also has a    brother who died abruptly of a pulmonary embolism.          Past Surgical Hx:  Past Surgical History  Procedure Laterality Date  . Colectomy    . Stent      cardiac x 8 stents.  . Incision and drainage      bilateral axillary, non specific staff  . Incision and drainage abscess  09/28/2012    Procedure: INCISION AND DRAINAGE ABSCESS;  Surgeon: Zenovia Jarred, MD;  Location: [place];  Service: General;  Laterality: Bilateral;  . Esophagogastroduodenoscopy (egd) with esophageal dilation  10/10/2012    Procedure: ESOPHAGOGASTRODUODENOSCOPY (EGD) WITH ESOPHAGEAL DILATION;  Surgeon: Inda Castle, MD;  Location: [place];  Service: Endoscopy;  Laterality: N/A;  . Breast lumpectomy    . Heart stents  x 8    Past Medical Hx:  Past Medical History  Diagnosis Date  . CAD (coronary artery disease)     S/p PTCA / stenting (last cath 2004, multiple LAD stents, 2 stents in the right coronary artery all patent)  . VTE (venous thromboembolism) 10/2010     DVT and PE. Started coumadin  . HTN (hypertension)   . Restless leg syndrome   . PMR (polymyalgia rheumatica) (HCC)   . Axillary abscess   . Anxiety   . Dementia   . Complication of anesthesia   . PONV (postoperative nausea and vomiting)   . GERD (gastroesophageal reflux disease)   . Cancer [facility])     Past Gynecological History:   No LMP recorded. Patient is postmenopausal.  Family Hx: History reviewed. No pertinent family history.  Review of Systems:  Constitutional  Feels well,    ENT Hard of hearing Skin/Breast  No rash, sores, jaundice, itching, dryness Cardiovascular  No chest pain, shortness of breath, or edema  Pulmonary  No cough or wheeze.  Gastro Intestinal  No nausea, vomitting, or diarrhoea. No bright red blood per rectum, no abdominal pain, change in bowel movement, or constipation.  Genito Urinary  + urinary incontinence Musculo Skeletal  No myalgia, arthralgia, joint swelling or pain  Neurologic  No weakness, numbness, change in gait,  Psychology  No depression, anxiety, insomnia.   Vitals:  Blood pressure 135/62, pulse 89, temperature 97.6 F (36.4 C), temperature source Oral, resp. rate 16, height 5\' 2"  (1.575 m), weight 137 lb 6.4 oz (62.324 kg), SpO2 98 %.  Physical Exam: WD in NAD Neck  Supple NROM, without any enlargements.  Lymph Node Survey No cervical supraclavicular or inguinal adenopathy Cardiovascular  Pulse normal rate, regularity and rhythm. S1 and S2 normal.  Lungs  Clear to auscultation bilateraly, without wheezes/crackles/rhonchi. Good air movement.  Skin  No rash/lesions/breakdown  Psychiatry  Alert and oriented to person, place, and time  Abdomen  Normoactive bowel sounds, abdomen soft, non-tender and non-obese without evidence of hernia.  Back No CVA tenderness Genito Urinary  Vulva/vagina: deferred Rectal  deferrred Extremities  No bilateral cyanosis, clubbing or edema.   Donaciano Eva, MD  7/3/[name], 1:26  PM

## 2016-05-03 NOTE — Patient Instructions (Addendum)
Follow up with Dr Radene Knee as discussed with Dr Everitt Amber during your consultation visit today . No follow up visits required with Dr Denman George .  Thank you !

## 2016-05-10 NOTE — Progress Notes (Signed)
This encounter was created in error - please disregard.

## 2016-05-24 DIAGNOSIS — Z0000 Encounter for general adult medical examination without abnormal findings: Secondary | ICD-10-CM | POA: Diagnosis not present

## 2016-05-24 DIAGNOSIS — Z13220 Encounter for screening for lipoid disorders: Secondary | ICD-10-CM | POA: Diagnosis not present

## 2016-05-24 DIAGNOSIS — I10 Essential (primary) hypertension: Secondary | ICD-10-CM | POA: Diagnosis not present

## 2016-05-27 DIAGNOSIS — K912 Postsurgical malabsorption, not elsewhere classified: Secondary | ICD-10-CM | POA: Diagnosis not present

## 2016-05-27 DIAGNOSIS — L570 Actinic keratosis: Secondary | ICD-10-CM | POA: Diagnosis not present

## 2016-05-27 DIAGNOSIS — R05 Cough: Secondary | ICD-10-CM | POA: Diagnosis not present

## 2016-05-27 DIAGNOSIS — R350 Frequency of micturition: Secondary | ICD-10-CM | POA: Diagnosis not present

## 2016-05-27 DIAGNOSIS — N3946 Mixed incontinence: Secondary | ICD-10-CM | POA: Diagnosis not present

## 2016-05-27 DIAGNOSIS — Z0000 Encounter for general adult medical examination without abnormal findings: Secondary | ICD-10-CM | POA: Diagnosis not present

## 2016-05-31 DIAGNOSIS — R05 Cough: Secondary | ICD-10-CM | POA: Diagnosis not present

## 2016-05-31 DIAGNOSIS — Z86718 Personal history of other venous thrombosis and embolism: Secondary | ICD-10-CM | POA: Diagnosis not present

## 2016-05-31 DIAGNOSIS — Z7901 Long term (current) use of anticoagulants: Secondary | ICD-10-CM | POA: Diagnosis not present

## 2016-05-31 DIAGNOSIS — Z0000 Encounter for general adult medical examination without abnormal findings: Secondary | ICD-10-CM | POA: Diagnosis not present

## 2016-07-01 DIAGNOSIS — Z86718 Personal history of other venous thrombosis and embolism: Secondary | ICD-10-CM | POA: Diagnosis not present

## 2016-07-01 DIAGNOSIS — Z7901 Long term (current) use of anticoagulants: Secondary | ICD-10-CM | POA: Diagnosis not present

## 2016-07-01 DIAGNOSIS — Z79899 Other long term (current) drug therapy: Secondary | ICD-10-CM | POA: Diagnosis not present

## 2016-07-08 DIAGNOSIS — D649 Anemia, unspecified: Secondary | ICD-10-CM | POA: Diagnosis not present

## 2016-07-08 DIAGNOSIS — Z23 Encounter for immunization: Secondary | ICD-10-CM | POA: Diagnosis not present

## 2016-07-08 DIAGNOSIS — R05 Cough: Secondary | ICD-10-CM | POA: Diagnosis not present

## 2016-07-08 DIAGNOSIS — R159 Full incontinence of feces: Secondary | ICD-10-CM | POA: Diagnosis not present

## 2016-08-02 DIAGNOSIS — Z86718 Personal history of other venous thrombosis and embolism: Secondary | ICD-10-CM | POA: Diagnosis not present

## 2016-08-02 DIAGNOSIS — Z7901 Long term (current) use of anticoagulants: Secondary | ICD-10-CM | POA: Diagnosis not present

## 2016-09-06 DIAGNOSIS — Z7901 Long term (current) use of anticoagulants: Secondary | ICD-10-CM | POA: Diagnosis not present

## 2016-09-06 DIAGNOSIS — Z86718 Personal history of other venous thrombosis and embolism: Secondary | ICD-10-CM | POA: Diagnosis not present

## 2016-10-07 DIAGNOSIS — Z7901 Long term (current) use of anticoagulants: Secondary | ICD-10-CM | POA: Diagnosis not present

## 2016-10-07 DIAGNOSIS — Z86718 Personal history of other venous thrombosis and embolism: Secondary | ICD-10-CM | POA: Diagnosis not present

## 2016-11-22 DIAGNOSIS — Z86718 Personal history of other venous thrombosis and embolism: Secondary | ICD-10-CM | POA: Diagnosis not present

## 2016-11-22 DIAGNOSIS — Z7901 Long term (current) use of anticoagulants: Secondary | ICD-10-CM | POA: Diagnosis not present

## 2016-12-06 DIAGNOSIS — D649 Anemia, unspecified: Secondary | ICD-10-CM | POA: Diagnosis not present

## 2016-12-06 DIAGNOSIS — E7800 Pure hypercholesterolemia, unspecified: Secondary | ICD-10-CM | POA: Diagnosis not present

## 2016-12-20 DIAGNOSIS — Z79899 Other long term (current) drug therapy: Secondary | ICD-10-CM | POA: Diagnosis not present

## 2016-12-20 DIAGNOSIS — I10 Essential (primary) hypertension: Secondary | ICD-10-CM | POA: Diagnosis not present

## 2016-12-20 DIAGNOSIS — R159 Full incontinence of feces: Secondary | ICD-10-CM | POA: Diagnosis not present

## 2016-12-20 DIAGNOSIS — R351 Nocturia: Secondary | ICD-10-CM | POA: Diagnosis not present

## 2016-12-27 DIAGNOSIS — Z86718 Personal history of other venous thrombosis and embolism: Secondary | ICD-10-CM | POA: Diagnosis not present

## 2016-12-27 DIAGNOSIS — Z7901 Long term (current) use of anticoagulants: Secondary | ICD-10-CM | POA: Diagnosis not present

## 2017-01-24 DIAGNOSIS — Z86718 Personal history of other venous thrombosis and embolism: Secondary | ICD-10-CM | POA: Diagnosis not present

## 2017-01-24 DIAGNOSIS — Z7901 Long term (current) use of anticoagulants: Secondary | ICD-10-CM | POA: Diagnosis not present

## 2017-02-21 DIAGNOSIS — Z7901 Long term (current) use of anticoagulants: Secondary | ICD-10-CM | POA: Diagnosis not present

## 2017-02-21 DIAGNOSIS — Z86718 Personal history of other venous thrombosis and embolism: Secondary | ICD-10-CM | POA: Diagnosis not present

## 2017-03-08 DIAGNOSIS — W19XXXA Unspecified fall, initial encounter: Secondary | ICD-10-CM | POA: Diagnosis not present

## 2017-03-08 DIAGNOSIS — M47816 Spondylosis without myelopathy or radiculopathy, lumbar region: Secondary | ICD-10-CM | POA: Diagnosis not present

## 2017-03-08 DIAGNOSIS — M545 Low back pain: Secondary | ICD-10-CM | POA: Diagnosis not present

## 2017-03-11 ENCOUNTER — Emergency Department (HOSPITAL_COMMUNITY): Payer: Medicare Other

## 2017-03-11 ENCOUNTER — Inpatient Hospital Stay (HOSPITAL_COMMUNITY)
Admission: EM | Admit: 2017-03-11 | Discharge: 2017-03-16 | DRG: 552 | Disposition: A | Discharge status: Skilled Nursing Facility | Payer: Medicare Other | Attending: Internal Medicine | Admitting: Internal Medicine

## 2017-03-11 ENCOUNTER — Encounter (HOSPITAL_COMMUNITY): Payer: Self-pay | Admitting: Emergency Medicine

## 2017-03-11 DIAGNOSIS — Z86711 Personal history of pulmonary embolism: Secondary | ICD-10-CM

## 2017-03-11 DIAGNOSIS — M549 Dorsalgia, unspecified: Secondary | ICD-10-CM | POA: Diagnosis present

## 2017-03-11 DIAGNOSIS — N390 Urinary tract infection, site not specified: Secondary | ICD-10-CM | POA: Diagnosis not present

## 2017-03-11 DIAGNOSIS — N179 Acute kidney failure, unspecified: Secondary | ICD-10-CM | POA: Diagnosis not present

## 2017-03-11 DIAGNOSIS — Z955 Presence of coronary angioplasty implant and graft: Secondary | ICD-10-CM

## 2017-03-11 DIAGNOSIS — Z882 Allergy status to sulfonamides status: Secondary | ICD-10-CM

## 2017-03-11 DIAGNOSIS — F329 Major depressive disorder, single episode, unspecified: Secondary | ICD-10-CM | POA: Diagnosis not present

## 2017-03-11 DIAGNOSIS — I2510 Atherosclerotic heart disease of native coronary artery without angina pectoris: Secondary | ICD-10-CM | POA: Diagnosis not present

## 2017-03-11 DIAGNOSIS — F0390 Unspecified dementia without behavioral disturbance: Secondary | ICD-10-CM | POA: Diagnosis not present

## 2017-03-11 DIAGNOSIS — K219 Gastro-esophageal reflux disease without esophagitis: Secondary | ICD-10-CM | POA: Diagnosis present

## 2017-03-11 DIAGNOSIS — Z888 Allergy status to other drugs, medicaments and biological substances status: Secondary | ICD-10-CM

## 2017-03-11 DIAGNOSIS — W1830XA Fall on same level, unspecified, initial encounter: Secondary | ICD-10-CM | POA: Diagnosis present

## 2017-03-11 DIAGNOSIS — M546 Pain in thoracic spine: Secondary | ICD-10-CM | POA: Diagnosis not present

## 2017-03-11 DIAGNOSIS — D649 Anemia, unspecified: Secondary | ICD-10-CM | POA: Diagnosis present

## 2017-03-11 DIAGNOSIS — R791 Abnormal coagulation profile: Secondary | ICD-10-CM | POA: Diagnosis not present

## 2017-03-11 DIAGNOSIS — I10 Essential (primary) hypertension: Secondary | ICD-10-CM | POA: Diagnosis not present

## 2017-03-11 DIAGNOSIS — G2581 Restless legs syndrome: Secondary | ICD-10-CM | POA: Diagnosis not present

## 2017-03-11 DIAGNOSIS — Z853 Personal history of malignant neoplasm of breast: Secondary | ICD-10-CM

## 2017-03-11 DIAGNOSIS — F419 Anxiety disorder, unspecified: Secondary | ICD-10-CM | POA: Diagnosis present

## 2017-03-11 DIAGNOSIS — S32020A Wedge compression fracture of second lumbar vertebra, initial encounter for closed fracture: Secondary | ICD-10-CM

## 2017-03-11 DIAGNOSIS — S299XXA Unspecified injury of thorax, initial encounter: Secondary | ICD-10-CM | POA: Diagnosis not present

## 2017-03-11 DIAGNOSIS — Z881 Allergy status to other antibiotic agents status: Secondary | ICD-10-CM

## 2017-03-11 DIAGNOSIS — Z885 Allergy status to narcotic agent status: Secondary | ICD-10-CM

## 2017-03-11 DIAGNOSIS — I8290 Acute embolism and thrombosis of unspecified vein: Secondary | ICD-10-CM | POA: Diagnosis not present

## 2017-03-11 DIAGNOSIS — S32029A Unspecified fracture of second lumbar vertebra, initial encounter for closed fracture: Principal | ICD-10-CM | POA: Diagnosis present

## 2017-03-11 DIAGNOSIS — M353 Polymyalgia rheumatica: Secondary | ICD-10-CM | POA: Diagnosis present

## 2017-03-11 DIAGNOSIS — L308 Other specified dermatitis: Secondary | ICD-10-CM | POA: Diagnosis present

## 2017-03-11 DIAGNOSIS — E7800 Pure hypercholesterolemia, unspecified: Secondary | ICD-10-CM | POA: Diagnosis not present

## 2017-03-11 DIAGNOSIS — N3000 Acute cystitis without hematuria: Secondary | ICD-10-CM | POA: Diagnosis not present

## 2017-03-11 DIAGNOSIS — T148XXA Other injury of unspecified body region, initial encounter: Secondary | ICD-10-CM | POA: Diagnosis not present

## 2017-03-11 DIAGNOSIS — Z880 Allergy status to penicillin: Secondary | ICD-10-CM

## 2017-03-11 DIAGNOSIS — Z7951 Long term (current) use of inhaled steroids: Secondary | ICD-10-CM

## 2017-03-11 DIAGNOSIS — Z7901 Long term (current) use of anticoagulants: Secondary | ICD-10-CM

## 2017-03-11 DIAGNOSIS — Z86718 Personal history of other venous thrombosis and embolism: Secondary | ICD-10-CM

## 2017-03-11 DIAGNOSIS — D689 Coagulation defect, unspecified: Secondary | ICD-10-CM | POA: Diagnosis not present

## 2017-03-11 DIAGNOSIS — E785 Hyperlipidemia, unspecified: Secondary | ICD-10-CM | POA: Diagnosis present

## 2017-03-11 DIAGNOSIS — M5489 Other dorsalgia: Secondary | ICD-10-CM | POA: Diagnosis not present

## 2017-03-11 DIAGNOSIS — Z7982 Long term (current) use of aspirin: Secondary | ICD-10-CM

## 2017-03-11 LAB — CBC WITH DIFFERENTIAL/PLATELET
BASOS ABS: 0.1 10*3/uL (ref 0.0–0.1)
Basophils Relative: 0 %
Eosinophils Absolute: 0.4 10*3/uL (ref 0.0–0.7)
Eosinophils Relative: 3 %
HEMATOCRIT: 36.6 % (ref 36.0–46.0)
Hemoglobin: 12 g/dL (ref 12.0–15.0)
LYMPHS PCT: 17 %
Lymphs Abs: 1.9 10*3/uL (ref 0.7–4.0)
MCH: 27.8 pg (ref 26.0–34.0)
MCHC: 32.8 g/dL (ref 30.0–36.0)
MCV: 84.7 fL (ref 78.0–100.0)
Monocytes Absolute: 0.7 10*3/uL (ref 0.1–1.0)
Monocytes Relative: 6 %
NEUTROS ABS: 8.2 10*3/uL — AB (ref 1.7–7.7)
Neutrophils Relative %: 74 %
Platelets: 276 10*3/uL (ref 150–400)
RBC: 4.32 MIL/uL (ref 3.87–5.11)
RDW: 14.8 % (ref 11.5–15.5)
WBC: 11.2 10*3/uL — AB (ref 4.0–10.5)

## 2017-03-11 LAB — URINALYSIS, ROUTINE W REFLEX MICROSCOPIC
BILIRUBIN URINE: NEGATIVE
GLUCOSE, UA: NEGATIVE mg/dL
KETONES UR: 5 mg/dL — AB
NITRITE: POSITIVE — AB
PH: 6 (ref 5.0–8.0)
PROTEIN: NEGATIVE mg/dL
Specific Gravity, Urine: 1.008 (ref 1.005–1.030)

## 2017-03-11 LAB — COMPREHENSIVE METABOLIC PANEL
ALT: 14 U/L (ref 14–54)
AST: 17 U/L (ref 15–41)
Albumin: 3.4 g/dL — ABNORMAL LOW (ref 3.5–5.0)
Alkaline Phosphatase: 60 U/L (ref 38–126)
Anion gap: 10 (ref 5–15)
BILIRUBIN TOTAL: 0.5 mg/dL (ref 0.3–1.2)
BUN: 13 mg/dL (ref 6–20)
CO2: 22 mmol/L (ref 22–32)
CREATININE: 0.96 mg/dL (ref 0.44–1.00)
Calcium: 8.8 mg/dL — ABNORMAL LOW (ref 8.9–10.3)
Chloride: 105 mmol/L (ref 101–111)
GFR calc Af Amer: 57 mL/min — ABNORMAL LOW (ref >60)
GFR, EST NON AFRICAN AMERICAN: 49 mL/min — AB (ref >60)
Glucose, Bld: 104 mg/dL — ABNORMAL HIGH (ref 65–99)
Potassium: 3.5 mmol/L (ref 3.5–5.1)
Sodium: 137 mmol/L (ref 135–145)
TOTAL PROTEIN: 6.7 g/dL (ref 6.5–8.1)

## 2017-03-11 LAB — CBC AND DIFFERENTIAL
HEMATOCRIT: 37 % (ref 36–46)
HEMOGLOBIN: 12 g/dL (ref 12.0–16.0)
Platelets: 276 10*3/uL (ref 150–399)
WBC: 11.2 10^3/mL

## 2017-03-11 LAB — HEPATIC FUNCTION PANEL
ALK PHOS: 60 U/L (ref 25–125)
ALT: 14 U/L (ref 7–35)
AST: 17 U/L (ref 13–35)
BILIRUBIN, TOTAL: 0.5 mg/dL

## 2017-03-11 LAB — BASIC METABOLIC PANEL
BUN: 13 mg/dL (ref 4–21)
Creatinine: 1 mg/dL (ref 0.5–1.1)
Glucose: 104 mg/dL
POTASSIUM: 3.5 mmol/L (ref 3.4–5.3)
SODIUM: 137 mmol/L (ref 137–147)

## 2017-03-11 MED ORDER — FENTANYL CITRATE (PF) 100 MCG/2ML IJ SOLN
25.0000 ug | Freq: Once | INTRAMUSCULAR | Status: AC
Start: 2017-03-11 — End: 2017-03-12
  Administered 2017-03-12: 25 ug via INTRAVENOUS
  Filled 2017-03-11: qty 2

## 2017-03-11 MED ORDER — DEXTROSE 5 % IV SOLN
1.0000 g | Freq: Once | INTRAVENOUS | Status: AC
  Administered 2017-03-12: 1 g via INTRAVENOUS
  Filled 2017-03-11: qty 10

## 2017-03-11 MED ORDER — FENTANYL CITRATE (PF) 100 MCG/2ML IJ SOLN
25.0000 ug | Freq: Once | INTRAMUSCULAR | Status: AC
  Administered 2017-03-11: 25 ug via INTRAVENOUS
  Filled 2017-03-11: qty 2

## 2017-03-11 NOTE — ED Triage Notes (Signed)
Per EMS, pt had a fall on Monday. Pt was taken to doctor on Tuesday and had x-rays. No abnormality was found. Pt here today c/o "excrutiating back pain." Pt is alert and oriented.

## 2017-03-11 NOTE — ED Provider Notes (Signed)
[facility] DEPT Provider Note   CSN: [id number] Arrival date & time: 03/11/17  1742     History   Chief Complaint Chief Complaint  Patient presents with  . Back Pain    HPI [name] [name] is a 83 y.o. female.  The history is provided by the patient and a relative.  Back Pain   This is a new problem. The current episode started more than 2 days ago. The problem occurs constantly. The problem has been gradually worsening. The pain is associated with falling. The pain is present in the lumbar spine and thoracic spine. The quality of the pain is described as aching and stabbing. The pain does not radiate. The pain is at a severity of 10/10. The pain is severe. The symptoms are aggravated by bending and twisting. The pain is the same all the time. Associated symptoms include bowel incontinence (at baseline) and bladder incontinence (at baseline). Pertinent negatives include no chest pain, no fever, no numbness, no headaches, no abdominal pain, no perianal numbness, no dysuria, no pelvic pain, no leg pain, no paresthesias, no paresis and no weakness. She has tried nothing for the symptoms. The treatment provided no relief.    Past Medical History:  Diagnosis Date  . Anxiety   . Axillary abscess   . CAD (coronary artery disease)    S/p PTCA / stenting (last cath 2004, multiple LAD stents, 2 stents in the right coronary artery all patent)  . Cancer ([place])   . Complication of anesthesia   . Dementia   . GERD (gastroesophageal reflux disease)   . HTN (hypertension)   . PMR (polymyalgia rheumatica) (HCC)   . PONV (postoperative nausea and vomiting)   . Restless leg syndrome   . 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 Started coumadin    Patient Active Problem List   Diagnosis Date Noted  . Leukocytosis 07/31/2015  . Acute bronchitis 07/28/2015  . Chronic anticoagulation 07/28/2015  . Intractable back pain 07/20/2015  . Back pain 07/20/2015  . Cellulitis and abscess  of left buttock 04/12/2013  . Stricture and stenosis of esophagus 10/10/2012  . Dementia 10/06/2012  . Dysphagia 10/06/2012  . Axillary abscess - bilateral, multiple 09/27/2012  . Hypotension 08/24/2012  . Near syncope 08/24/2012  . Abscess of axilla, left 06/27/2012  . Bradycardia 06/13/2012  . Abscess 06/11/2012  . Chest pain 06/09/2012  . Cellulitis 06/09/2012  . Weakness of both legs 06/09/2012  . PMR (polymyalgia rheumatica) (HCC)   . Restless leg syndrome   . Angina pectoris, unstable ([place]) 03/02/2012  . Anemia 03/02/2012  . Dyspnea 02/03/2012  . UTI (urinary tract infection) 02/03/2012  . CAD (coronary artery disease)   . HTN (hypertension)   . VTE (venous thromboembolism) 10/01/2010  . HYPERCHOLESTEROLEMIA  IIA 08/27/2009  . Hyperlipidemia 12/11/2008  . HYPERTENSION, BENIGN 12/11/2008    Past Surgical History:  Procedure Laterality Date  . BREAST LUMPECTOMY    . COLECTOMY    . ESOPHAGOGASTRODUODENOSCOPY (EGD) WITH ESOPHAGEAL DILATION  10/10/2012   Procedure: ESOPHAGOGASTRODUODENOSCOPY (EGD) WITH ESOPHAGEAL DILATION;  Surgeon: Inda Castle, MD;  Location: [facility];  Service: Endoscopy;  Laterality: N/A;  . heart stents  x 8  . INCISION AND DRAINAGE     bilateral axillary, non specific staff  . INCISION AND DRAINAGE ABSCESS  09/28/2012   Procedure: INCISION AND DRAINAGE ABSCESS;  Surgeon: Zenovia Jarred, MD;  Location: [facility];  Service: General;  Laterality: Bilateral;  . stent  cardiac x 8 stents.    OB History    No data available       Home Medications    Prior to Admission medications   Medication Sig Start Date End Date Taking? Authorizing Provider  acetaminophen (TYLENOL) 500 MG tablet Take 500 mg by mouth 3 (three) times daily as needed for moderate pain (NOT MORE THAN 3,000 MG IN 24 HOUR PERIOD). For pain    [provider]  amLODipine (NORVASC) 10 MG tablet Take 10 mg by mouth every morning.     [provider]  aspirin  EC 81 MG tablet Take 1 tablet (81 mg total) by mouth daily. 03/01/14   Regalado, Belkys A, MD  atorvastatin (LIPITOR) 20 MG tablet Take 10 mg by mouth every evening.     [provider]  benzonatate (TESSALON PERLES) 100 MG capsule Take 100 mg by mouth 3 (three) times daily as needed for cough.    [provider]  cholecalciferol (VITAMIN D) 1000 UNITS tablet Take 2,000 Units by mouth daily with lunch.     [provider]  cholestyramine light (PREVALITE) 4 G packet Take 4 g by mouth daily as needed (for loose bowels).    [provider]  donepezil (ARICEPT) 10 MG tablet Take 10 mg by mouth every morning.    [provider]  ferrous sulfate 325 (65 FE) MG EC tablet Take 325 mg by mouth daily with breakfast.    [provider]  fluticasone (FLONASE) 50 MCG/ACT nasal spray Place 1 spray into both nostrils daily as needed for allergies or rhinitis.    [provider]  ipratropium (ATROVENT) 0.03 % nasal spray Place 2 sprays into both nostrils 3 (three) times daily as needed for rhinitis.    [provider]  loratadine (CLARITIN) 10 MG tablet Take 10 mg by mouth daily as needed for allergies. Reported on 05/03/2016    [provider]  LORazepam (ATIVAN) 0.5 MG tablet  04/19/16   [provider]  losartan (COZAAR) 100 MG tablet Take 50 mg by mouth at bedtime.     [provider]  losartan (COZAAR) 50 MG tablet Take by mouth.    [provider]  mirabegron ER (MYRBETRIQ) 25 MG TB24 Take 25 mg by mouth every evening.     [provider]  mupirocin ointment (BACTROBAN) 2 % Apply 1 application topically daily with lunch.     [provider]  nitroGLYCERIN (NITROSTAT) 0.4 MG SL tablet Place 0.4 mg under the tongue every 5 (five) minutes as needed for chest pain.    [provider]  oxybutynin (DITROPAN) 5 MG tablet  04/15/16   [provider]  pantoprazole (PROTONIX) 40 MG  tablet Take 40 mg by mouth 2 (two) times daily as needed (for heartburn).    [provider]  Pediatric Multiple Vit-C-FA (CHILDRENS CHEWABLE MULTI VITS PO) Take 1 tablet by mouth daily with lunch.     [provider]  polyethylene glycol powder (GLYCOLAX/MIRALAX) powder Take 17 g by mouth 2 (two) times daily. Until daily soft stools  OTC 08/10/15   Noemi Chapel, MD  potassium chloride SA (K-DUR,KLOR-CON) 20 MEQ tablet Take 20 mEq by mouth daily with lunch.     [provider]  predniSONE (DELTASONE) 1 MG tablet  03/21/16   [provider]  ranitidine (ZANTAC) 150 MG tablet Take 1 tablet (150 mg total) by mouth 2 (two) times daily. Patient not taking: Reported on 05/03/2016  08/10/15   Noemi Chapel, MD  rOPINIRole (REQUIP) 1 MG tablet Take 1 mg by mouth 3 (three) times daily. 1mg  at noon, 1mg  2 hours before bed, then 1mg  at bedtime    [provider]  sertraline (ZOLOFT) 25 MG tablet Take 25 mg by mouth daily.    [provider]  SILENOR 6 MG TABS  04/28/16   [provider]  warfarin (COUMADIN) 2.5 MG tablet Take 2.5 mg by mouth every evening.     [provider]    Family History History reviewed. No pertinent family history.  Social History Social History  Substance Use Topics  . Smoking status: Never Smoker  . Smokeless tobacco: Never Used  . Alcohol use No     Allergies   Ambien [zolpidem tartrate]; Codeine phosphate; Darvocet [propoxyphene n-acetaminophen]; Promethazine hcl; Sulfamethoxazole; Vicodin [hydrocodone-acetaminophen]; Amoxicillin; and Penicillins   Review of Systems Review of Systems  Constitutional: Negative for activity change, chills, diaphoresis, fatigue and fever.  HENT: Negative for congestion and rhinorrhea.   Eyes: Negative for visual disturbance.  Respiratory: Negative for cough, chest tightness, shortness of breath and stridor.   Cardiovascular: Negative for chest pain, palpitations and  leg swelling.  Gastrointestinal: Positive for bowel incontinence (at baseline). Negative for abdominal distention, abdominal pain, constipation, diarrhea, nausea and vomiting.  Genitourinary: Positive for bladder incontinence (at baseline). Negative for difficulty urinating, dysuria, flank pain, frequency, hematuria, menstrual problem, pelvic pain, vaginal bleeding and vaginal discharge.  Musculoskeletal: Positive for back pain. Negative for neck pain and neck stiffness.  Skin: Negative for rash and wound.  Neurological: Negative for dizziness, weakness, light-headedness, numbness, headaches and paresthesias.  Psychiatric/Behavioral: Negative for agitation and confusion.  All other systems reviewed and are negative.    Physical Exam Updated Vital Signs BP (!) 156/98 (BP Location: Left Arm)   Pulse 94   Temp 98.6 F (37 C) (Oral)   Resp 18   Ht 5\' 1"  (1.549 m)   Wt 137 lb (62.1 kg)   SpO2 98%   BMI 25.89 kg/m   Physical Exam  Constitutional: She is oriented to person, place, and time. She appears well-developed and well-nourished. No distress.  HENT:  Head: Normocephalic and atraumatic.  Mouth/Throat: Oropharynx is clear and moist. No oropharyngeal exudate.  Eyes: Conjunctivae are normal.  Neck: Normal range of motion. Neck supple.  Cardiovascular: Normal rate and regular rhythm.   No murmur heard. Pulmonary/Chest: Effort normal and breath sounds normal. No stridor. No respiratory distress. She has no wheezes. She exhibits no tenderness.  Abdominal: Soft. There is no tenderness.  Musculoskeletal: She exhibits tenderness. She exhibits no edema.       Lumbar back: She exhibits tenderness and pain.       Back:  Neurological: She is alert and oriented to person, place, and time. She displays normal reflexes. No cranial nerve deficit or sensory deficit. She exhibits normal muscle tone. Coordination normal. GCS eye subscore is 4. GCS verbal subscore is 5. GCS motor subscore is 6.    Symmetric pulse, sensation,And strength in the lower Extremities.  Skin: Skin is warm and dry. No rash noted. No erythema.  Psychiatric: She has a normal mood and affect.  Nursing note and vitals reviewed.    ED Treatments / Results  Labs (all labs ordered are listed, but only abnormal results are displayed) Labs Reviewed  CBC WITH DIFFERENTIAL/PLATELET - Abnormal; Notable for the following:       Result Value   WBC 11.2 (*)  Neutro Abs 8.2 (*)    All other components within normal limits  COMPREHENSIVE METABOLIC PANEL - Abnormal; Notable for the following:    Glucose, Bld 104 (*)    Calcium 8.8 (*)    Albumin 3.4 (*)    GFR calc non Af Amer 49 (*)    GFR calc Af Amer 57 (*)    All other components within normal limits  URINALYSIS, ROUTINE W REFLEX MICROSCOPIC - Abnormal; Notable for the following:    APPearance CLOUDY (*)    Hgb urine dipstick MODERATE (*)    Ketones, ur 5 (*)    Nitrite POSITIVE (*)    Leukocytes, UA LARGE (*)    Bacteria, UA FEW (*)    Squamous Epithelial / LPF 0-5 (*)    All other components within normal limits  PROTIME-INR - Abnormal; Notable for the following:    Prothrombin Time 29.3 (*)    All other components within normal limits  CBC - Abnormal; Notable for the following:    Hemoglobin 10.8 (*)    HCT 33.0 (*)    All other components within normal limits  URINE CULTURE    EKG  EKG Interpretation None       Radiology Dg Chest 2 View  Result Date: 03/11/2017 CLINICAL DATA:  Fall 4 days prior with severe low back pain. History of breast cancer. EXAM: CHEST  2 VIEW COMPARISON:  Frontal and lateral views 05/27/2016 FINDINGS: Unchanged elevation of right hemidiaphragm allowing for differences in technique. Stable heart size and mediastinal contours. Tortuosity atherosclerosis of the thoracic aorta. Minimal bibasilar atelectasis or scarring. No consolidation, pleural effusion, pneumothorax or pulmonary edema. The bones are under  mineralized. No grossly displaced rib fracture. IMPRESSION: No acute abnormality.  Chronic elevation of right hemidiaphragm. Tortuous thoracic aorta with atherosclerosis. Electronically Signed   By: Jeb Levering M.D.   On: 03/11/2017 21:29   Ct Thoracic Spine Wo Contrast  Result Date: 03/11/2017 CLINICAL DATA:  Fall with back pain.  History of breast cancer. EXAM: CT THORACIC AND LUMBAR SPINE WITHOUT CONTRAST TECHNIQUE: Multidetector CT imaging of the thoracic and lumbar spine was performed without contrast. Multiplanar CT image reconstructions were also generated. COMPARISON:  Lumbar spine radiograph 03/08/2017 Thoracic spine CT 11/14/2015 Lumbar spine MRI 07/20/2015 FINDINGS: CT THORACIC SPINE FINDINGS Alignment: Normal Vertebrae: There are compression deformities of the T9 and T12 vertebrae, both of which are chronic and unchanged compared to 11/14/2015. Paraspinal and other soft tissues: Aortic atherosclerosis. Otherwise unremarkable. Disc levels: No bony spinal canal stenosis. Intervertebral disc spaces are maintained. No evidence of traumatic herniation. CT LUMBAR SPINE FINDINGS Segmentation: Normal Alignment: Normal Vertebrae: Compression deformity of the L3 vertebral body is unchanged compared to MRI of 07/20/2015. L2 compression deformity is new compared to this MR and the CT of 11/14/2015, but unchanged compared to the recent radiograph of 03/08/2017. There is no adjacent hematoma or soft tissue abnormality. There is gas in the disc space and underlying the endplate. Paraspinal and other soft tissues: Aortic atherosclerosis. Disc levels: No evidence of traumatic disc herniation. No bony spinal canal stenosis. There is bilateral L4-L5 and L5-S1 neural foraminal narrowing, worst at right L4-5. IMPRESSION: 1. L2 superior endplate compression fracture is age indeterminate. Given the presence of an intravertebral vacuum cleft, this is favored to be a subacute fracture (within 2 weeks). Recommend  correlation with history of trauma). 2. Chronic compression deformities at T9, T12 and L3. 3. No bony spinal canal stenosis or evidence of traumatic disc herniation.  4. Aortic atherosclerosis. Electronically Signed   By: Ulyses Jarred M.D.   On: 03/11/2017 21:29   Ct Lumbar Spine Wo Contrast  Result Date: 03/11/2017 CLINICAL DATA:  Fall with back pain.  History of breast cancer. EXAM: CT THORACIC AND LUMBAR SPINE WITHOUT CONTRAST TECHNIQUE: Multidetector CT imaging of the thoracic and lumbar spine was performed without contrast. Multiplanar CT image reconstructions were also generated. COMPARISON:  Lumbar spine radiograph 03/08/2017 Thoracic spine CT 11/14/2015 Lumbar spine MRI 07/20/2015 FINDINGS: CT THORACIC SPINE FINDINGS Alignment: Normal Vertebrae: There are compression deformities of the T9 and T12 vertebrae, both of which are chronic and unchanged compared to 11/14/2015. Paraspinal and other soft tissues: Aortic atherosclerosis. Otherwise unremarkable. Disc levels: No bony spinal canal stenosis. Intervertebral disc spaces are maintained. No evidence of traumatic herniation. CT LUMBAR SPINE FINDINGS Segmentation: Normal Alignment: Normal Vertebrae: Compression deformity of the L3 vertebral body is unchanged compared to MRI of 07/20/2015. L2 compression deformity is new compared to this MR and the CT of 11/14/2015, but unchanged compared to the recent radiograph of 03/08/2017. There is no adjacent hematoma or soft tissue abnormality. There is gas in the disc space and underlying the endplate. Paraspinal and other soft tissues: Aortic atherosclerosis. Disc levels: No evidence of traumatic disc herniation. No bony spinal canal stenosis. There is bilateral L4-L5 and L5-S1 neural foraminal narrowing, worst at right L4-5. IMPRESSION: 1. L2 superior endplate compression fracture is age indeterminate. Given the presence of an intravertebral vacuum cleft, this is favored to be a subacute fracture (within 2 weeks).  Recommend correlation with history of trauma). 2. Chronic compression deformities at T9, T12 and L3. 3. No bony spinal canal stenosis or evidence of traumatic disc herniation. 4. Aortic atherosclerosis. Electronically Signed   By: Ulyses Jarred M.D.   On: 03/11/2017 21:29    Procedures Procedures (including critical care time)  Medications Ordered in ED Medications  acetaminophen (TYLENOL) tablet 650 mg (not administered)    Or  acetaminophen (TYLENOL) suppository 650 mg (not administered)  ondansetron (ZOFRAN) tablet 4 mg (not administered)    Or  ondansetron (ZOFRAN) injection 4 mg (not administered)  fentaNYL (SUBLIMAZE) injection 25 mcg (25 mcg Intravenous Given 03/12/17 0428)  cefTRIAXone (ROCEPHIN) 1 g in dextrose 5 % 50 mL IVPB (not administered)  traMADol (ULTRAM) tablet 50 mg (not administered)  LORazepam (ATIVAN) tablet 0.5 mg (0.5 mg Oral Given 03/12/17 0953)  oxybutynin (DITROPAN) tablet 5 mg (5 mg Oral Given 03/12/17 0952)  predniSONE (DELTASONE) tablet 1 mg (1 mg Oral Given 03/12/17 0812)  doxepin (SINEQUAN) capsule 10 mg (10 mg Oral Given 03/12/17 0209)  pantoprazole (PROTONIX) EC tablet 40 mg (40 mg Oral Given 03/12/17 0952)  sertraline (ZOLOFT) tablet 25 mg (25 mg Oral Given 03/12/17 0953)  aspirin EC tablet 81 mg (81 mg Oral Given 03/12/17 0953)  losartan (COZAAR) tablet 50 mg (50 mg Oral Given 03/12/17 0155)  rOPINIRole (REQUIP) tablet 1 mg (1 mg Oral Given 03/12/17 1153)  atorvastatin (LIPITOR) tablet 10 mg (not administered)  amLODipine (NORVASC) tablet 10 mg (10 mg Oral Given 03/12/17 0953)  donepezil (ARICEPT) tablet 10 mg (10 mg Oral Given 03/12/17 1019)  potassium chloride SA (K-DUR,KLOR-CON) CR tablet 20 mEq (20 mEq Oral Given 03/12/17 1153)  acetaminophen (TYLENOL) tablet 500 mg (500 mg Oral Given 03/12/17 0953)  Warfarin - Pharmacist Dosing Inpatient ( Does not apply Duplicate 5/80/99 8338)  warfarin (COUMADIN) tablet 2.5 mg (not administered)  fentaNYL (SUBLIMAZE)  injection 25 mcg (25 mcg Intravenous Given 03/11/17  2153)  fentaNYL (SUBLIMAZE) injection 25 mcg (25 mcg Intravenous Given 03/12/17 0057)  cefTRIAXone (ROCEPHIN) 1 g in dextrose 5 % 50 mL IVPB (1 g Intravenous Transfusing/Transfer 03/12/17 0101)     Initial Impression / Assessment and Plan / ED Course  I have reviewed the triage vital signs and the nursing notes.  Pertinent labs & imaging results that were available during my care of the patient were reviewed by me and considered in my medical decision making (see chart for details).     TIFANI DACK is a 83 y.o. female with a past medical history significant for CAD, DVT/PE, breast cancer, restless leg syndrome, hypertension, and hyperlipidemia who presents with back pain after a fall. Patient is accompanied by family who reports that on Monday, 5 days ago, patient had a fall in the kitchen striking her back against the wall and then sliding down to her bottom on the ground. He reports the patient had immediate onset of pain. Pain did not improve with home pain medications and they wanted to see a doctor the following day. At that visit, patient had an x-ray of the spine showing no evidence of acute fracture and was reassured. Family reports they have continued to give patient pain medications but she has had severe decrease in mobility due to the severe pain with any type of movement. They report that she is incontinent of both bowel and bladder at baseline. Patient describes the pain as severe and in her low back. She says it is also in the middle of her back. It does not radiate down her legs and she denies any weakness or numbness in the legs. She has tingling with the restless legs that is unchanged.  On my exam, patient has tenderness in the thoracic and lumbar spine. There is tenderness in both midline and paraspinal areas. Patient was able to move both of her legs and had normal sensation bilaterally. Normal pulses. No discoloration or  significant tenderness of the abdomen. Legs have mild edema bilaterally. On exam, patient's lungs had crackles bilaterally. No chest tenderness. No focal neurologic deficits appreciated.  Given patient's history of cancer in the severe pain in her back after a fall, patient will have CT scans of the thoracic and lumbar spine to look for fractures. Patient will be given a small dose of pain medicine as family reports patient does not do well with strong pain medicines. Patient has green laboratory testing given the crackles in the lungs and reported a dry cough. The patient will have chest x-ray to assess this.  Despite reassessment following workup. Family was very concerned about the level of care needs for the patient is barely able to help pick her up and she is unable to ambulate due to the pain.   Diagnostic testing results are seen above. Patient found to have a subacute/recently new L2 superior in-flight compression fracture. Suspect this is secondary to her recent fall. This is likely the cause of her severe pain. Chronic compression fractures were still visible and were known by family.  Patient also found to have urinary tract infection. Patient given antibiotics.  Patient continued to require IV pain medications. Patient will be admitted secondary to severe pain, decrease in ambulation, and for physical therapy evaluation in a 83 year old new lumbar fracture. Patient and family were very agreeable to plan of admission. Patient admitted in stable condition.     Final Clinical Impressions(s) / ED Diagnoses   Final diagnoses:  Closed compression fracture of  second lumbar vertebra, initial encounter ([place])  Acute cystitis without hematuria    Clinical Impression: 1. Closed compression fracture of second lumbar vertebra, initial encounter ([place])   2. Acute cystitis without hematuria     Disposition: Admit to Hospitalist service    Tegeler, Gwenyth Allegra, MD 03/12/17 1247

## 2017-03-12 ENCOUNTER — Encounter (HOSPITAL_COMMUNITY): Payer: Self-pay | Admitting: *Deleted

## 2017-03-12 DIAGNOSIS — M549 Dorsalgia, unspecified: Secondary | ICD-10-CM | POA: Diagnosis not present

## 2017-03-12 DIAGNOSIS — S32020A Wedge compression fracture of second lumbar vertebra, initial encounter for closed fracture: Secondary | ICD-10-CM

## 2017-03-12 DIAGNOSIS — I10 Essential (primary) hypertension: Secondary | ICD-10-CM | POA: Diagnosis not present

## 2017-03-12 DIAGNOSIS — Z7901 Long term (current) use of anticoagulants: Secondary | ICD-10-CM | POA: Diagnosis not present

## 2017-03-12 DIAGNOSIS — I8290 Acute embolism and thrombosis of unspecified vein: Secondary | ICD-10-CM

## 2017-03-12 DIAGNOSIS — N390 Urinary tract infection, site not specified: Secondary | ICD-10-CM

## 2017-03-12 HISTORY — DX: Wedge compression fracture of second lumbar vertebra, initial encounter for closed fracture: S32.020A

## 2017-03-12 LAB — CBC
HEMATOCRIT: 33 % — AB (ref 36.0–46.0)
Hemoglobin: 10.8 g/dL — ABNORMAL LOW (ref 12.0–15.0)
MCH: 27.6 pg (ref 26.0–34.0)
MCHC: 32.7 g/dL (ref 30.0–36.0)
MCV: 84.4 fL (ref 78.0–100.0)
Platelets: 264 10*3/uL (ref 150–400)
RBC: 3.91 MIL/uL (ref 3.87–5.11)
RDW: 15 % (ref 11.5–15.5)
WBC: 9.5 10*3/uL (ref 4.0–10.5)

## 2017-03-12 LAB — CBC AND DIFFERENTIAL
HCT: 33 % — AB (ref 36–46)
HEMOGLOBIN: 10.8 g/dL — AB (ref 12.0–16.0)
PLATELETS: 264 10*3/uL (ref 150–399)
WBC: 9.5 10*3/mL

## 2017-03-12 LAB — PROTIME-INR
INR: 2.71
PROTHROMBIN TIME: 29.3 s — AB (ref 11.4–15.2)

## 2017-03-12 MED ORDER — WARFARIN - PHARMACIST DOSING INPATIENT: Freq: Every day | Status: DC

## 2017-03-12 MED ORDER — ROPINIROLE HCL 1 MG PO TABS
1.0000 mg | ORAL_TABLET | ORAL | Status: DC
  Administered 2017-03-12 – 2017-03-16 (×14): 1 mg via ORAL
  Filled 2017-03-12 (×13): qty 1

## 2017-03-12 MED ORDER — PANTOPRAZOLE SODIUM 40 MG PO TBEC
40.0000 mg | DELAYED_RELEASE_TABLET | Freq: Every day | ORAL | Status: DC
  Administered 2017-03-12 – 2017-03-16 (×5): 40 mg via ORAL
  Filled 2017-03-12 (×5): qty 1

## 2017-03-12 MED ORDER — TRAMADOL HCL 50 MG PO TABS
50.0000 mg | ORAL_TABLET | Freq: Four times a day (QID) | ORAL | Status: DC | PRN
  Administered 2017-03-13: 50 mg via ORAL
  Filled 2017-03-12: qty 1

## 2017-03-12 MED ORDER — LOSARTAN POTASSIUM 50 MG PO TABS
50.0000 mg | ORAL_TABLET | Freq: Every day | ORAL | Status: DC
Start: 2017-03-12 — End: 2017-03-16
  Administered 2017-03-12 – 2017-03-15 (×5): 50 mg via ORAL
  Filled 2017-03-12 (×5): qty 1

## 2017-03-12 MED ORDER — ATORVASTATIN CALCIUM 10 MG PO TABS
10.0000 mg | ORAL_TABLET | Freq: Every evening | ORAL | Status: DC
  Administered 2017-03-12 – 2017-03-15 (×4): 10 mg via ORAL
  Filled 2017-03-12 (×4): qty 1

## 2017-03-12 MED ORDER — LORAZEPAM 0.5 MG PO TABS
0.5000 mg | ORAL_TABLET | Freq: Two times a day (BID) | ORAL | Status: DC
  Administered 2017-03-12 – 2017-03-16 (×10): 0.5 mg via ORAL
  Filled 2017-03-12 (×10): qty 1

## 2017-03-12 MED ORDER — ACETAMINOPHEN 650 MG RE SUPP: 650.0000 mg | Freq: Four times a day (QID) | RECTAL | Status: DC | PRN

## 2017-03-12 MED ORDER — POTASSIUM CHLORIDE CRYS ER 20 MEQ PO TBCR
20.0000 meq | EXTENDED_RELEASE_TABLET | Freq: Every day | ORAL | Status: DC
  Administered 2017-03-12 – 2017-03-16 (×5): 20 meq via ORAL
  Filled 2017-03-12 (×5): qty 1

## 2017-03-12 MED ORDER — DONEPEZIL HCL 10 MG PO TABS
10.0000 mg | ORAL_TABLET | Freq: Every morning | ORAL | Status: DC
  Administered 2017-03-12 – 2017-03-16 (×5): 10 mg via ORAL
  Filled 2017-03-12 (×3): qty 2
  Filled 2017-03-12: qty 1
  Filled 2017-03-12: qty 2
  Filled 2017-03-12 (×2): qty 1

## 2017-03-12 MED ORDER — OXYBUTYNIN CHLORIDE 5 MG PO TABS
5.0000 mg | ORAL_TABLET | Freq: Two times a day (BID) | ORAL | Status: DC
  Administered 2017-03-12 – 2017-03-16 (×10): 5 mg via ORAL
  Filled 2017-03-12 (×10): qty 1

## 2017-03-12 MED ORDER — PREDNISONE 1 MG PO TABS
1.0000 mg | ORAL_TABLET | Freq: Every day | ORAL | Status: DC
Start: 2017-03-12 — End: 2017-03-16
  Administered 2017-03-12 – 2017-03-16 (×5): 1 mg via ORAL
  Filled 2017-03-12 (×5): qty 1

## 2017-03-12 MED ORDER — ASPIRIN EC 81 MG PO TBEC
81.0000 mg | DELAYED_RELEASE_TABLET | Freq: Every day | ORAL | Status: DC
  Administered 2017-03-12 – 2017-03-16 (×5): 81 mg via ORAL
  Filled 2017-03-12 (×5): qty 1

## 2017-03-12 MED ORDER — ONDANSETRON HCL 4 MG PO TABS: 4.0000 mg | ORAL_TABLET | Freq: Four times a day (QID) | ORAL | Status: DC | PRN

## 2017-03-12 MED ORDER — SERTRALINE HCL 25 MG PO TABS
25.0000 mg | ORAL_TABLET | Freq: Every day | ORAL | Status: DC
  Administered 2017-03-12 – 2017-03-16 (×5): 25 mg via ORAL
  Filled 2017-03-12 (×5): qty 1

## 2017-03-12 MED ORDER — ACETAMINOPHEN 500 MG PO TABS
500.0000 mg | ORAL_TABLET | Freq: Three times a day (TID) | ORAL | Status: DC
  Administered 2017-03-12 – 2017-03-16 (×13): 500 mg via ORAL
  Filled 2017-03-12 (×13): qty 1

## 2017-03-12 MED ORDER — ONDANSETRON HCL 4 MG/2ML IJ SOLN: 4.0000 mg | Freq: Four times a day (QID) | INTRAMUSCULAR | Status: DC | PRN

## 2017-03-12 MED ORDER — IBUPROFEN 200 MG PO TABS: 200.0000 mg | ORAL_TABLET | ORAL | Status: DC | PRN

## 2017-03-12 MED ORDER — ACETAMINOPHEN 325 MG PO TABS: 650.0000 mg | ORAL_TABLET | Freq: Four times a day (QID) | ORAL | Status: DC | PRN

## 2017-03-12 MED ORDER — DOXEPIN HCL 10 MG PO CAPS
10.0000 mg | ORAL_CAPSULE | Freq: Every day | ORAL | Status: DC
  Administered 2017-03-12 – 2017-03-15 (×5): 10 mg via ORAL
  Filled 2017-03-12 (×5): qty 1

## 2017-03-12 MED ORDER — AMLODIPINE BESYLATE 5 MG PO TABS
10.0000 mg | ORAL_TABLET | Freq: Every morning | ORAL | Status: DC
  Administered 2017-03-12 – 2017-03-16 (×5): 10 mg via ORAL
  Filled 2017-03-12 (×5): qty 2

## 2017-03-12 MED ORDER — FENTANYL CITRATE (PF) 100 MCG/2ML IJ SOLN
25.0000 ug | INTRAMUSCULAR | Status: DC | PRN
  Administered 2017-03-12 – 2017-03-13 (×2): 25 ug via INTRAVENOUS
  Filled 2017-03-12 (×2): qty 2

## 2017-03-12 MED ORDER — DEXTROSE 5 % IV SOLN
1.0000 g | INTRAVENOUS | Status: DC
  Administered 2017-03-12 – 2017-03-15 (×4): 1 g via INTRAVENOUS
  Filled 2017-03-12 (×5): qty 10

## 2017-03-12 MED ORDER — WARFARIN SODIUM 2.5 MG PO TABS
2.5000 mg | ORAL_TABLET | Freq: Once | ORAL | Status: AC
  Administered 2017-03-12: 2.5 mg via ORAL
  Filled 2017-03-12: qty 1

## 2017-03-12 NOTE — Progress Notes (Signed)
Pt alert x 3,  TX from ED. From home with son. No family present at the moment. No C/O pain S/P fall  bruises noted to Lf  Triceps and mid back and knee cap. No personal belongings present at this time. I will continue to monitor.

## 2017-03-12 NOTE — Progress Notes (Addendum)
PROGRESS NOTE    [name] [name]  UYQ:[id number] DOB: 1921-03-20 DOA: 03/11/2017 PCP: Deland Pretty, MD    Brief Narrative:  83 yo female presented with the chief complain of intractable back pain and decreased mobility. 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 Had mechanical fall 6 days prior to admission, then developed severe pain in her mid-back. Worsening symptoms despite po analgesics to the point where patient became not ambulatory and unable to do her activities of daily living including feeding herself. On the initial physical examination patient hemodynamically stable, moist mucous membranes, lungs clear to ascultation, rhythmic S1 and S2, abdomen soft with no lower extremity edema or deformity. Spine CT with new subacute L2 superior endplate compression fracture and chronic compression deformities at T9, T12 and L3.  Patient admitted with the working diagnosis of acute ambulatory dysfunction due L2 subacute compression fracture complicated with urine infection.    Assessment & Plan:   Principal Problem:   Intractable back pain Active Problems:   CAD (coronary artery disease)   VTE (venous thromboembolism)   Chronic anticoagulation   Compression fracture of L2 (HCC)   Acute lower UTI   Accelerated hypertension   1. L2 subacute compression fracture. Will continue pain control with acetaminophen, tramadol and will add ibuprofen, will hold on fentanyl for now. Follow on physical therapy evaluation.   2. Urine infection. Positive UA for infection, will continue antibiotic therapy with ceftriaxone #0. Old records personally reviewed, culture from 08/2015 with e coli sensitive to cephalosporins.    3. Polymyalgia rheumatica. Patient on low dose prednisone, will continue pain control and physical therapy evaluation, out of bed to chair.   4. HTN. Continue blood pressure control with amlodipine and losartan. Blood pressure 130 to 154.   5.  History of massive pulmonary embolism. Old records personally reviewed, 2013 cardiology follow up note, mentioned massive PE in 2011. Decision was made to continue warfarin indefinitely for now due to high risk.   6. Dementia. No agitation, will continue neuro checks per unit protocol, will continue sertraline, lorazepam and donepezil.    DVT prophylaxis: warfarin  Code Status: Full  Family Communication: No family at the bedside  Disposition Plan: Home    Consultants:     Procedures:    Antimicrobials:    ceftriaxone    Subjective: Patient feeling better, no nausea or vomiting, out of bed to chair. Persistent pain at the lower back, moderate in intensity, improved with pain medications, worse with movement. At home uses walker for ambulation.   Objective: Vitals:   03/12/17 0036 03/12/17 0123 03/12/17 0614 03/12/17 0953  BP: (!) 157/64 (!) 180/58 (!) 147/67 133/63  Pulse: (!) 53 (!) 52 61   Resp: 18 16 14    Temp: 98.7 F (37.1 C) 97.8 F (36.6 C) 98.2 F (36.8 C)   TempSrc: Oral Oral Oral   SpO2: 96% 98% 94%   Weight:  66.5 kg (146 lb 11.2 oz)    Height:  5\' 1"  (1.549 m)      Intake/Output Summary (Last 24 hours) at 03/12/17 1218 Last data filed at 03/12/17 1000  Gross per 24 hour  Intake              360 ml  Output                0 ml  Net              360 ml   Filed  Weights   03/11/17 1813 03/12/17 0123  Weight: 62.1 kg (137 lb) 66.5 kg (146 lb 11.2 oz)    Examination:  General exam: deconditioned. E ENT. Mild pallor, no icterus, oral mucosa moist.   Respiratory system: Clear to auscultation. No wheezing, rales or rhonchi. Decreased breath sounds at bases due to poor inspiratory effort.  Cardiovascular system: S1 & S2 heard, RRR. No JVD, murmurs, rubs, gallops or clicks. Trace non pitting edema. Gastrointestinal system: Abdomen is nondistended, soft and nontender. No organomegaly or masses felt. Normal bowel sounds heard. Central nervous system: Alert  and oriented. No focal neurological deficits. Extremities: Symmetric 5 x 5 power. Skin: No rashes, lesions or ulcers  Data Reviewed: I have personally reviewed following labs and imaging studies  CBC:  Recent Labs Lab 03/11/17 2031 03/12/17 0437  WBC 11.2* 9.5  NEUTROABS 8.2*  --   HGB 12.0 10.8*  HCT 36.6 33.0*  MCV 84.7 84.4  PLT 276 195   Basic Metabolic Panel:  Recent Labs Lab 03/11/17 2031  NA 137  K 3.5  CL 105  CO2 22  GLUCOSE 104*  BUN 13  CREATININE 0.96  CALCIUM 8.8*   GFR: Estimated Creatinine Clearance: 32 mL/min (by C-G formula based on SCr of 0.96 mg/dL). Liver Function Tests:  Recent Labs Lab 03/11/17 2031  AST 17  ALT 14  ALKPHOS 60  BILITOT 0.5  PROT 6.7  ALBUMIN 3.4*   No results for input(s): LIPASE, AMYLASE in the last 168 hours. No results for input(s): AMMONIA in the last 168 hours. Coagulation Profile:  Recent Labs Lab 03/12/17 0437  INR 2.71   Cardiac Enzymes: No results for input(s): CKTOTAL, CKMB, CKMBINDEX, TROPONINI in the last 168 hours. BNP (last 3 results) No results for input(s): PROBNP in the last 8760 hours. HbA1C: No results for input(s): HGBA1C in the last 72 hours. CBG: No results for input(s): GLUCAP in the last 168 hours. Lipid Profile: No results for input(s): CHOL, HDL, LDLCALC, TRIG, CHOLHDL, LDLDIRECT in the last 72 hours. Thyroid Function Tests: No results for input(s): TSH, T4TOTAL, FREET4, T3FREE, THYROIDAB in the last 72 hours. Anemia Panel: No results for input(s): VITAMINB12, FOLATE, FERRITIN, TIBC, IRON, RETICCTPCT in the last 72 hours. Sepsis Labs: No results for input(s): PROCALCITON, LATICACIDVEN in the last 168 hours.  No results found for this or any previous visit (from the past 240 hour(s)).       Radiology Studies: Dg Chest 2 View  Result Date: 03/11/2017 CLINICAL DATA:  Fall 4 days prior with severe low back pain. History of breast cancer. EXAM: CHEST  2 VIEW COMPARISON:   Frontal and lateral views 05/27/2016 FINDINGS: Unchanged elevation of right hemidiaphragm allowing for differences in technique. Stable heart size and mediastinal contours. Tortuosity atherosclerosis of the thoracic aorta. Minimal bibasilar atelectasis or scarring. No consolidation, pleural effusion, pneumothorax or pulmonary edema. The bones are under mineralized. No grossly displaced rib fracture. IMPRESSION: No acute abnormality.  Chronic elevation of right hemidiaphragm. Tortuous thoracic aorta with atherosclerosis. Electronically Signed   By: Jeb Levering M.D.   On: 03/11/2017 21:29   Ct Thoracic Spine Wo Contrast  Result Date: 03/11/2017 CLINICAL DATA:  Fall with back pain.  History of breast cancer. EXAM: CT THORACIC AND LUMBAR SPINE WITHOUT CONTRAST TECHNIQUE: Multidetector CT imaging of the thoracic and lumbar spine was performed without contrast. Multiplanar CT image reconstructions were also generated. COMPARISON:  Lumbar spine radiograph 03/08/2017 Thoracic spine CT 11/14/2015 Lumbar spine MRI 07/20/2015 FINDINGS: CT THORACIC SPINE  FINDINGS Alignment: Normal Vertebrae: There are compression deformities of the T9 and T12 vertebrae, both of which are chronic and unchanged compared to 11/14/2015. Paraspinal and other soft tissues: Aortic atherosclerosis. Otherwise unremarkable. Disc levels: No bony spinal canal stenosis. Intervertebral disc spaces are maintained. No evidence of traumatic herniation. CT LUMBAR SPINE FINDINGS Segmentation: Normal Alignment: Normal Vertebrae: Compression deformity of the L3 vertebral body is unchanged compared to MRI of 07/20/2015. L2 compression deformity is new compared to this MR and the CT of 11/14/2015, but unchanged compared to the recent radiograph of 03/08/2017. There is no adjacent hematoma or soft tissue abnormality. There is gas in the disc space and underlying the endplate. Paraspinal and other soft tissues: Aortic atherosclerosis. Disc levels: No evidence  of traumatic disc herniation. No bony spinal canal stenosis. There is bilateral L4-L5 and L5-S1 neural foraminal narrowing, worst at right L4-5. IMPRESSION: 1. L2 superior endplate compression fracture is age indeterminate. Given the presence of an intravertebral vacuum cleft, this is favored to be a subacute fracture (within 2 weeks). Recommend correlation with history of trauma). 2. Chronic compression deformities at T9, T12 and L3. 3. No bony spinal canal stenosis or evidence of traumatic disc herniation. 4. Aortic atherosclerosis. Electronically Signed   By: Ulyses Jarred M.D.   On: 03/11/2017 21:29   Ct Lumbar Spine Wo Contrast  Result Date: 03/11/2017 CLINICAL DATA:  Fall with back pain.  History of breast cancer. EXAM: CT THORACIC AND LUMBAR SPINE WITHOUT CONTRAST TECHNIQUE: Multidetector CT imaging of the thoracic and lumbar spine was performed without contrast. Multiplanar CT image reconstructions were also generated. COMPARISON:  Lumbar spine radiograph 03/08/2017 Thoracic spine CT 11/14/2015 Lumbar spine MRI 07/20/2015 FINDINGS: CT THORACIC SPINE FINDINGS Alignment: Normal Vertebrae: There are compression deformities of the T9 and T12 vertebrae, both of which are chronic and unchanged compared to 11/14/2015. Paraspinal and other soft tissues: Aortic atherosclerosis. Otherwise unremarkable. Disc levels: No bony spinal canal stenosis. Intervertebral disc spaces are maintained. No evidence of traumatic herniation. CT LUMBAR SPINE FINDINGS Segmentation: Normal Alignment: Normal Vertebrae: Compression deformity of the L3 vertebral body is unchanged compared to MRI of 07/20/2015. L2 compression deformity is new compared to this MR and the CT of 11/14/2015, but unchanged compared to the recent radiograph of 03/08/2017. There is no adjacent hematoma or soft tissue abnormality. There is gas in the disc space and underlying the endplate. Paraspinal and other soft tissues: Aortic atherosclerosis. Disc levels: No  evidence of traumatic disc herniation. No bony spinal canal stenosis. There is bilateral L4-L5 and L5-S1 neural foraminal narrowing, worst at right L4-5. IMPRESSION: 1. L2 superior endplate compression fracture is age indeterminate. Given the presence of an intravertebral vacuum cleft, this is favored to be a subacute fracture (within 2 weeks). Recommend correlation with history of trauma). 2. Chronic compression deformities at T9, T12 and L3. 3. No bony spinal canal stenosis or evidence of traumatic disc herniation. 4. Aortic atherosclerosis. Electronically Signed   By: Ulyses Jarred M.D.   On: 03/11/2017 21:29        Scheduled Meds: . acetaminophen  500 mg Oral TID  . amLODipine  10 mg Oral q morning - 10a  . aspirin EC  81 mg Oral Daily  . atorvastatin  10 mg Oral QPM  . donepezil  10 mg Oral q morning - 10a  . doxepin  10 mg Oral QHS  . LORazepam  0.5 mg Oral BID  . losartan  50 mg Oral QHS  . oxybutynin  5  mg Oral BID  . pantoprazole  40 mg Oral Daily  . potassium chloride SA  20 mEq Oral Q lunch  . predniSONE  1 mg Oral Q breakfast  . rOPINIRole  1 mg Oral 3 times per day  . sertraline  25 mg Oral Daily  . warfarin  2.5 mg Oral ONCE-1800  . Warfarin - Pharmacist Dosing Inpatient   Does not apply q1800   Continuous Infusions: . cefTRIAXone (ROCEPHIN)  IV       LOS: 0 days      Mauricio Gerome Apley, MD [facility] Pager [phone]  If 7PM-7AM, please contact night-coverage [email or web address] Password [facility] 03/12/2017, 12:18 PM

## 2017-03-12 NOTE — H&P (Signed)
History and Physical    [name] [name] TSV:[id number] DOB: 1921/05/16 DOA: 03/11/2017  PCP: Deland Pretty, MD   Patient coming from: Home via EMS  Chief Complaint: Intractable back pain, decreased mobility  HPI: [name] [name] is a 83 y.o. woman with a history of VTE (anticoagulated with warfarin), Polymyalgia rheumatica, restless leg syndrome, HTN, GERD, CAD S/P multiple stents, and breast cancer who also has a known history of prior compression fractures.  She had an accidental fall on Monday (witnessed by her son, no loss of consciousness) then developed pain in her mid-back.  She was evaluated by the NP in her PCP's office and referred for xrays, which did not show any acute fractures.  Since then, she has increased her tramadol and lorazepam dosing to TID, and she has been using acetaminophen 325mg  as needed for pain management.  Pain is still uncontrolled with any movement.  She is unable to ambulate in the home.  She is no longer able to feed herself without significant pain.  Pain is 9-10 out of 10 in intensity with any movement; moderate at rest.  No fever.  No headache.  No nausea or vomiting.  No abdominal pain.  No dysuria.  No chest pain or shortness of breath.  ED Course: CT of the thoracic and lumbar spines show subacute L2 superior endplate compression fracture (age indeterminate but likely happened in the past two weeks based on radiologic features) and chronic compression fractures at T9, T12, and L3.  Chest xray negative for acute process.  WBC count 11.  U/A is nitrite positive with large leukocytes, TNTC WBC, and few bacteria.  She has received IV fentanyl and Rocephin in the ED.  Hospitalist asked to admit.  Review of Systems: As per HPI otherwise 10 systems reviewed and negative.   Past Medical History:  Diagnosis Date  . Anxiety   . Axillary abscess   . CAD (coronary artery disease)    S/p PTCA / stenting (last cath 2004, multiple LAD stents, 2 stents in the right  coronary artery all patent)  . Cancer ([place])   . Complication of anesthesia   . Dementia   . GERD (gastroesophageal reflux disease)   . HTN (hypertension)   . PMR (polymyalgia rheumatica) (HCC)   . PONV (postoperative nausea and vomiting)   . Restless leg syndrome   . 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 Started coumadin    Past Surgical History:  Procedure Laterality Date  . BREAST LUMPECTOMY    . COLECTOMY    . ESOPHAGOGASTRODUODENOSCOPY (EGD) WITH ESOPHAGEAL DILATION  10/10/2012   Procedure: ESOPHAGOGASTRODUODENOSCOPY (EGD) WITH ESOPHAGEAL DILATION;  Surgeon: Inda Castle, MD;  Location: [facility];  Service: Endoscopy;  Laterality: N/A;  . heart stents  x 8  . INCISION AND DRAINAGE     bilateral axillary, non specific staff  . INCISION AND DRAINAGE ABSCESS  09/28/2012   Procedure: INCISION AND DRAINAGE ABSCESS;  Surgeon: Zenovia Jarred, MD;  Location: [facility];  Service: General;  Laterality: Bilateral;  . stent     cardiac x 8 stents.     reports that she has never smoked. She has never used smokeless tobacco. She reports that she does not drink alcohol or use drugs.  Allergies  Allergen Reactions  . Ambien [Zolpidem Tartrate] Nausea And Vomiting  . Codeine Phosphate Nausea And Vomiting  . Darvocet [Propoxyphene N-Acetaminophen] Nausea And Vomiting  . Promethazine Hcl Nausea And Vomiting  . Sulfamethoxazole Other (See  Comments)    Pt doesn't remember reaction  . Vicodin [Hydrocodone-Acetaminophen] Other (See Comments)    Unknown reaction  . Amoxicillin Nausea And Vomiting and Rash  . Penicillins Nausea And Vomiting and Rash    History reviewed. No pertinent family history.  Heart disease prevalent.  One sister died of breast cancer in her 38's.   Prior to Admission medications   Medication Sig Start Date End Date Taking? Authorizing Provider  amLODipine (NORVASC) 10 MG tablet Take 10 mg by mouth every morning.    Yes [provider]    atorvastatin (LIPITOR) 20 MG tablet Take 10 mg by mouth every evening.    Yes [provider]  donepezil (ARICEPT) 10 MG tablet Take 10 mg by mouth every morning.   Yes [provider]  LORazepam (ATIVAN) 0.5 MG tablet  04/19/16  Yes [provider]  losartan (COZAAR) 100 MG tablet Take 50 mg by mouth at bedtime.    Yes [provider]  oxybutynin (DITROPAN) 5 MG tablet  04/15/16  Yes [provider]  potassium chloride SA (K-DUR,KLOR-CON) 20 MEQ tablet Take 20 mEq by mouth daily with lunch.    Yes [provider]  predniSONE (DELTASONE) 1 MG tablet  03/21/16  Yes [provider]  rOPINIRole (REQUIP) 1 MG tablet Take 1 mg by mouth 3 (three) times daily. 1mg  at noon, 1mg  2 hours before bed, then 1mg  at bedtime   Yes [provider]  sertraline (ZOLOFT) 25 MG tablet Take 25 mg by mouth daily.   Yes [provider]  SILENOR 6 MG TABS  04/28/16  Yes [provider]  warfarin (COUMADIN) 2.5 MG tablet Take 2.5 mg by mouth every evening.    Yes [provider]  acetaminophen (TYLENOL) 500 MG tablet Take 500 mg by mouth 3 (three) times daily as needed for moderate pain (NOT MORE THAN 3,000 MG IN 24 HOUR PERIOD). For pain    [provider]  aspirin EC 81 MG tablet Take 1 tablet (81 mg total) by mouth daily. 03/01/14   Regalado, Belkys A, MD  benzonatate (TESSALON PERLES) 100 MG capsule Take 100 mg by mouth 3 (three) times daily as needed for cough.    [provider]  cholecalciferol (VITAMIN D) 1000 UNITS tablet Take 2,000 Units by mouth daily with lunch.     [provider]  cholestyramine light (PREVALITE) 4 G packet Take 4 g by mouth daily as needed (for loose bowels).    [provider]  ferrous sulfate 325 (65 FE) MG EC tablet Take 325 mg by mouth daily with breakfast.    [provider]  fluticasone (FLONASE) 50 MCG/ACT nasal spray Place 1 spray into both  nostrils daily as needed for allergies or rhinitis.    [provider]  ipratropium (ATROVENT) 0.03 % nasal spray Place 2 sprays into both nostrils 3 (three) times daily as needed for rhinitis.    [provider]  loratadine (CLARITIN) 10 MG tablet Take 10 mg by mouth daily as needed for allergies. Reported on 05/03/2016    [provider]  losartan (COZAAR) 50 MG tablet Take by mouth.    [provider]  mirabegron ER (MYRBETRIQ) 25 MG TB24 Take 25 mg by mouth every evening.     [provider]  mupirocin ointment (BACTROBAN) 2 % Apply 1 application topically daily with lunch.     [provider]  nitroGLYCERIN (NITROSTAT) 0.4 MG SL tablet Place 0.4  mg under the tongue every 5 (five) minutes as needed for chest pain.    [provider]  pantoprazole (PROTONIX) 40 MG tablet Take 40 mg by mouth 2 (two) times daily as needed (for heartburn).    [provider]  Pediatric Multiple Vit-C-FA (CHILDRENS CHEWABLE MULTI VITS PO) Take 1 tablet by mouth daily with lunch.     [provider]  polyethylene glycol powder (GLYCOLAX/MIRALAX) powder Take 17 g by mouth 2 (two) times daily. Until daily soft stools  OTC 08/10/15   Noemi Chapel, MD  ranitidine (ZANTAC) 150 MG tablet Take 1 tablet (150 mg total) by mouth 2 (two) times daily. Patient not taking: Reported on 05/03/2016 08/10/15   Noemi Chapel, MD    Physical Exam: Vitals:   03/11/17 1805 03/11/17 1812 03/11/17 1813 03/11/17 2156  BP: (!) 173/71 (!) 156/98  (!) 189/66  Pulse: 78 94  67  Resp: 18 18  18   Temp: 99.3 F (37.4 C) 98.6 F (37 C)  98.9 F (37.2 C)  TempSrc: Oral Oral  Oral  SpO2: 98% 98%  97%  Weight:   62.1 kg (137 lb)   Height:   5\' 1"  (1.549 m)       Constitutional: NAD, calm, comfortable as long as she is still Vitals:   03/11/17 1805 03/11/17 1812 03/11/17 1813 03/11/17 2156  BP: (!) 173/71 (!) 156/98  (!) 189/66  Pulse: 78 94  67  Resp: 18 18   18   Temp: 99.3 F (37.4 C) 98.6 F (37 C)  98.9 F (37.2 C)  TempSrc: Oral Oral  Oral  SpO2: 98% 98%  97%  Weight:   62.1 kg (137 lb)   Height:   5\' 1"  (1.549 m)    Eyes: PERRL, lids and conjunctivae normal ENMT: Mucous membranes are moist. Posterior pharynx not completely visualized.  She is missing several teeth. Neck: normal appearance, supple, no masses Respiratory: clear to auscultation listening anteriorly.  Normal respiratory effort. No accessory muscle use.  Cardiovascular: Normal rate, regular rhythm, + murmur.  No extremity edema. 2+ pedal pulses.  GI: abdomen is soft and compressible.  No distention.  No tenderness.  No guarding.  Bowel sounds are present. Musculoskeletal:  No joint deformity in upper and lower extremities. Good ROM, no contractures. Normal muscle tone.  Skin: no rashes, warm and dry Neurologic: Face symmetric.  Moves all four extremities spontaneously.  No focal deficits. Psychiatric: Alert and oriented to person, place. Normal mood.     Labs on Admission: I have personally reviewed following labs and imaging studies  CBC:  Recent Labs Lab 03/11/17 2031  WBC 11.2*  NEUTROABS 8.2*  HGB 12.0  HCT 36.6  MCV 84.7  PLT 295   Basic Metabolic Panel:  Recent Labs Lab 03/11/17 2031  NA 137  K 3.5  CL 105  CO2 22  GLUCOSE 104*  BUN 13  CREATININE 0.96  CALCIUM 8.8*   GFR: Estimated Creatinine Clearance: 30.9 mL/min (by C-G formula based on SCr of 0.96 mg/dL). Liver Function Tests:  Recent Labs Lab 03/11/17 2031  AST 17  ALT 14  ALKPHOS 60  BILITOT 0.5  PROT 6.7  ALBUMIN 3.4*   Urine analysis:    Component Value Date/Time   COLORURINE YELLOW 03/11/2017 2130   APPEARANCEUR CLOUDY (A) 03/11/2017 2130   LABSPEC 1.008 03/11/2017 2130   PHURINE 6.0 03/11/2017 2130   GLUCOSEU NEGATIVE 03/11/2017 2130   HGBUR MODERATE (A) 03/11/2017 2130   [place] NEGATIVE 03/11/2017  2130   KETONESUR 5 (A) 03/11/2017 2130   PROTEINUR NEGATIVE  03/11/2017 2130   UROBILINOGEN 0.2 08/09/2015 2256   NITRITE POSITIVE (A) 03/11/2017 2130   LEUKOCYTESUR LARGE (A) 03/11/2017 2130    Radiological Exams on Admission: Dg Chest 2 View  Result Date: 03/11/2017 CLINICAL DATA:  Fall 4 days prior with severe low back pain. History of breast cancer. EXAM: CHEST  2 VIEW COMPARISON:  Frontal and lateral views 05/27/2016 FINDINGS: Unchanged elevation of right hemidiaphragm allowing for differences in technique. Stable heart size and mediastinal contours. Tortuosity atherosclerosis of the thoracic aorta. Minimal bibasilar atelectasis or scarring. No consolidation, pleural effusion, pneumothorax or pulmonary edema. The bones are under mineralized. No grossly displaced rib fracture. IMPRESSION: No acute abnormality.  Chronic elevation of right hemidiaphragm. Tortuous thoracic aorta with atherosclerosis. Electronically Signed   By: Jeb Levering M.D.   On: 03/11/2017 21:29   Ct Thoracic Spine Wo Contrast  Result Date: 03/11/2017 CLINICAL DATA:  Fall with back pain.  History of breast cancer. EXAM: CT THORACIC AND LUMBAR SPINE WITHOUT CONTRAST TECHNIQUE: Multidetector CT imaging of the thoracic and lumbar spine was performed without contrast. Multiplanar CT image reconstructions were also generated. COMPARISON:  Lumbar spine radiograph 03/08/2017 Thoracic spine CT 11/14/2015 Lumbar spine MRI 07/20/2015 FINDINGS: CT THORACIC SPINE FINDINGS Alignment: Normal Vertebrae: There are compression deformities of the T9 and T12 vertebrae, both of which are chronic and unchanged compared to 11/14/2015. Paraspinal and other soft tissues: Aortic atherosclerosis. Otherwise unremarkable. Disc levels: No bony spinal canal stenosis. Intervertebral disc spaces are maintained. No evidence of traumatic herniation. CT LUMBAR SPINE FINDINGS Segmentation: Normal Alignment: Normal Vertebrae: Compression deformity of the L3 vertebral body is unchanged compared to MRI of 07/20/2015. L2  compression deformity is new compared to this MR and the CT of 11/14/2015, but unchanged compared to the recent radiograph of 03/08/2017. There is no adjacent hematoma or soft tissue abnormality. There is gas in the disc space and underlying the endplate. Paraspinal and other soft tissues: Aortic atherosclerosis. Disc levels: No evidence of traumatic disc herniation. No bony spinal canal stenosis. There is bilateral L4-L5 and L5-S1 neural foraminal narrowing, worst at right L4-5. IMPRESSION: 1. L2 superior endplate compression fracture is age indeterminate. Given the presence of an intravertebral vacuum cleft, this is favored to be a subacute fracture (within 2 weeks). Recommend correlation with history of trauma). 2. Chronic compression deformities at T9, T12 and L3. 3. No bony spinal canal stenosis or evidence of traumatic disc herniation. 4. Aortic atherosclerosis. Electronically Signed   By: Ulyses Jarred M.D.   On: 03/11/2017 21:29   Ct Lumbar Spine Wo Contrast  Result Date: 03/11/2017 CLINICAL DATA:  Fall with back pain.  History of breast cancer. EXAM: CT THORACIC AND LUMBAR SPINE WITHOUT CONTRAST TECHNIQUE: Multidetector CT imaging of the thoracic and lumbar spine was performed without contrast. Multiplanar CT image reconstructions were also generated. COMPARISON:  Lumbar spine radiograph 03/08/2017 Thoracic spine CT 11/14/2015 Lumbar spine MRI 07/20/2015 FINDINGS: CT THORACIC SPINE FINDINGS Alignment: Normal Vertebrae: There are compression deformities of the T9 and T12 vertebrae, both of which are chronic and unchanged compared to 11/14/2015. Paraspinal and other soft tissues: Aortic atherosclerosis. Otherwise unremarkable. Disc levels: No bony spinal canal stenosis. Intervertebral disc spaces are maintained. No evidence of traumatic herniation. CT LUMBAR SPINE FINDINGS Segmentation: Normal Alignment: Normal Vertebrae: Compression deformity of the L3 vertebral body is unchanged compared to MRI of  07/20/2015. L2 compression deformity is new compared to this MR and  the CT of 11/14/2015, but unchanged compared to the recent radiograph of 03/08/2017. There is no adjacent hematoma or soft tissue abnormality. There is gas in the disc space and underlying the endplate. Paraspinal and other soft tissues: Aortic atherosclerosis. Disc levels: No evidence of traumatic disc herniation. No bony spinal canal stenosis. There is bilateral L4-L5 and L5-S1 neural foraminal narrowing, worst at right L4-5. IMPRESSION: 1. L2 superior endplate compression fracture is age indeterminate. Given the presence of an intravertebral vacuum cleft, this is favored to be a subacute fracture (within 2 weeks). Recommend correlation with history of trauma). 2. Chronic compression deformities at T9, T12 and L3. 3. No bony spinal canal stenosis or evidence of traumatic disc herniation. 4. Aortic atherosclerosis. Electronically Signed   By: Ulyses Jarred M.D.   On: 03/11/2017 21:29    Assessment/Plan Principal Problem:   Intractable back pain Active Problems:   CAD (coronary artery disease)   VTE (venous thromboembolism)   Chronic anticoagulation   Compression fracture of L2 (HCC)   Acute lower UTI   Accelerated hypertension      Intractable back pain secondary to subacute L2 compression fracture --IV fentanyl 85mcg q4h prn severe pain --Trial of scheduled acetaminophen 500mg  TID --Tramadol 50mg  q6h prn for moderate pain --Ativan BID scheduled --PT eval and treat --OT eval and treat  UTI --Empiric Rocephin --Urine culture  PMR --Chronic low dose prednisone  Anxiety --Zoloft  HTN, accelerated in the setting of acute pain --Cozaar, norvasc --May need an prn  CAD --Baby aspirin, statin  RLS --Requip TID  Dementia --Aricept  VTE --On warfarin --Pharmacy to manage --Daily INR       DVT prophylaxis: Anticoagulated with warfarin Code Status: FULL Family Communication: Son present in the ED at  time of admission. Disposition Plan: To be determined.  Her son is interested in short term placement for rehab. Consults called: NONE Admission status: Place in observation, med surg   TIME SPENT: 57 minutes   Eber Jones MD [facility] Pager [phone]  If 7PM-7AM, please contact night-coverage [email or web address] Password [facility]  03/12/2017, 12:19 AM

## 2017-03-12 NOTE — Progress Notes (Signed)
PHARMACY NOTE -  ANTIBIOTIC RENAL DOSE ADJUSTMENT   Request received for Pharmacy to assist with antibiotic renal dose adjustment.  Patient has been initiated on Ceftriaxone 1gm iv q24hr   For UTI. SCr 0.96, estimated CrCl 30 ml/min Current dosage is appropriate and need for further dosage adjustment appears unlikely at present. Will sign off at this time.  Please reconsult if a change in clinical status warrants re-evaluation of dosage.

## 2017-03-12 NOTE — Evaluation (Signed)
Physical Therapy Evaluation Patient Details Name: [name] [name] MRN: [id number] DOB: 1920-12-15 Today's Date: 03/12/2017   History of Present Illness  [name] [name] is a 83 y.o. woman with a history of VTE (anticoagulated with warfarin), Polymyalgia rheumatica, restless leg syndrome, HTN, GERD, CAD S/P multiple stents, and breast cancer who also has a known history of prior compression fractures.  She had an accidental fall on Monday . CT shows L2, T9, T12 compression fxz, indeterminant age.  Clinical Impression  The patient tolerated mobility and ambulated x 15'. Pt admitted with above diagnosis. Pt currently with functional limitations due to the deficits listed below (see PT Problem List). Pt will benefit from skilled PT to increase their independence and safety with mobility to allow discharge to the venue listed below.       Follow Up Recommendations SNF    Equipment Recommendations  None recommended by PT    Recommendations for Other Services       Precautions / Restrictions Precautions Precautions: Back      Mobility  Bed Mobility Overal bed mobility: Needs Assistance Bed Mobility: Rolling;Sidelying to Sit Rolling: Mod assist Sidelying to sit: Mod assist       General bed mobility comments: cues to roll, back precautions  Transfers Overall transfer level: Needs assistance Equipment used: Rolling walker (2 wheeled) Transfers: Sit to/from Stand Sit to Stand: Min assist         General transfer comment: cues for hand placement  Ambulation/Gait Ambulation/Gait assistance: Min assist Ambulation Distance (Feet): 15 Feet Assistive device: Rolling walker (2 wheeled) Gait Pattern/deviations: Step-through pattern     General Gait Details: cues for sequence  Stairs            Wheelchair Mobility    Modified Rankin (Stroke Patients Only)       Balance                                             Pertinent Vitals/Pain Pain  Assessment: 0-10 Pain Score: 5  Pain Location: back Pain Descriptors / Indicators: Aching Pain Intervention(s): Monitored during session;Premedicated before session;Repositioned    Home Living Family/patient expects to be discharged to:: Private residence Living Arrangements: Children Available Help at Discharge: Family Type of Home: House Home Access: Stairs to enter Entrance Stairs-Rails: Right Entrance Stairs-Number of Steps: 2 Home Layout: One level Home Equipment: Environmental consultant - 4 wheels      Prior Function                 Hand Dominance        Extremity/Trunk Assessment   Upper Extremity Assessment Upper Extremity Assessment: Defer to OT evaluation    Lower Extremity Assessment Lower Extremity Assessment: Generalized weakness    Cervical / Trunk Assessment Cervical / Trunk Assessment: Kyphotic  Communication      Cognition Arousal/Alertness: Awake/alert Behavior During Therapy: WFL for tasks assessed/performed Overall Cognitive Status: Within Functional Limits for tasks assessed                                        General Comments      Exercises     Assessment/Plan    PT Assessment Patient needs continued PT services  PT Problem List Decreased activity tolerance;Decreased strength;Decreased mobility;Decreased knowledge of precautions;Decreased  safety awareness;Decreased knowledge of use of DME;Pain       PT Treatment Interventions DME instruction;Gait training;Functional mobility training;Therapeutic activities;Patient/family education    PT Goals (Current goals can be found in the Care Plan section)  Acute Rehab PT Goals Patient Stated Goal: to go to [facility] PT Goal Formulation: With patient Time For Goal Achievement: 03/26/17 Potential to Achieve Goals: Good    Frequency Min 3X/week   Barriers to discharge Decreased caregiver support      Co-evaluation               AM-PAC PT "6 Clicks" Daily Activity   Outcome Measure Difficulty turning over in bed (including adjusting bedclothes, sheets and blankets)?: A Little Difficulty moving from lying on back to sitting on the side of the bed? : A Little Difficulty sitting down on and standing up from a chair with arms (e.g., wheelchair, bedside commode, etc,.)?: A Little Help needed moving to and from a bed to chair (including a wheelchair)?: A Little Help needed walking in hospital room?: A Little Help needed climbing 3-5 steps with a railing? : A Lot 6 Click Score: 17    End of Session   Activity Tolerance: Patient tolerated treatment well Patient left: in chair;with call bell/phone within reach;with chair alarm set Nurse Communication: Mobility status PT Visit Diagnosis: History of falling (Z91.81);Difficulty in walking, not elsewhere classified (R26.2)    Time: 0258-5277 PT Time Calculation (min) (ACUTE ONLY): 18 min   Charges:   PT Evaluation $PT Eval Low Complexity: 1 Procedure     PT G Codes:   PT G-Codes **NOT FOR INPATIENT CLASS** Functional Assessment Tool Used: AM-PAC 6 Clicks Basic Mobility;Clinical judgement Functional Limitation: Mobility: Walking and moving around Mobility: Walking and Moving Around Current Status ([id number]): At least 40 percent but less than 60 percent impaired, limited or restricted Mobility: Walking and Moving Around Goal Status [phone]): At least 1 percent but less than 20 percent impaired, limited or restricted    [facility] PT [phone]   Claretha Cooper 03/12/2017, 2:53 PM

## 2017-03-12 NOTE — Progress Notes (Signed)
[place] for Warfarin Indication: Hx of VTE  Allergies  Allergen Reactions  . Ambien [Zolpidem Tartrate] Nausea And Vomiting  . Codeine Phosphate Nausea And Vomiting  . Darvocet [Propoxyphene N-Acetaminophen] Nausea And Vomiting  . Promethazine Hcl Nausea And Vomiting  . Sulfamethoxazole Other (See Comments)    Pt doesn't remember reaction  . Vicodin [Hydrocodone-Acetaminophen] Other (See Comments)    Unknown reaction  . Amoxicillin Nausea And Vomiting and Rash  . Penicillins Nausea And Vomiting and Rash    Patient Measurements: Height: 5\' 1"  (154.9 cm) Weight: 146 lb 11.2 oz (66.5 kg) IBW/kg (Calculated) : 47.8   Vital Signs: Temp: 98.2 F (36.8 C) (05/12 0614) Temp Source: Oral (05/12 0614) BP: 147/67 (05/12 8882) Pulse Rate: 61 (05/12 0614)  Labs:  Recent Labs  03/11/17 2031 03/12/17 0437  HGB 12.0 10.8*  HCT 36.6 33.0*  PLT 276 264  LABPROT  --  29.3*  INR  --  2.71  CREATININE 0.96  --     Estimated Creatinine Clearance: 32 mL/min (by C-G formula based on SCr of 0.96 mg/dL).   Medications:  Scheduled:  . acetaminophen  500 mg Oral TID  . amLODipine  10 mg Oral q morning - 10a  . aspirin EC  81 mg Oral Daily  . atorvastatin  10 mg Oral QPM  . donepezil  10 mg Oral q morning - 10a  . doxepin  10 mg Oral QHS  . LORazepam  0.5 mg Oral BID  . losartan  50 mg Oral QHS  . oxybutynin  5 mg Oral BID  . pantoprazole  40 mg Oral Daily  . potassium chloride SA  20 mEq Oral Q lunch  . predniSONE  1 mg Oral Q breakfast  . rOPINIRole  1 mg Oral 3 times per day  . sertraline  25 mg Oral Daily  . Warfarin - Pharmacist Dosing Inpatient   Does not apply q1800   Infusions:  . cefTRIAXone (ROCEPHIN)  IV     PRN: acetaminophen **OR** acetaminophen, fentaNYL (SUBLIMAZE) injection, ondansetron **OR** ondansetron (ZOFRAN) IV, traMADol  Assessment: 83 y/o F on chronic warfarin anticoagulation for hx of VTE, admitted with  intractable back pain and decreased mobility after a fall.  According to medication history patient's usual warfarin regimen is 2.5 mg every evening except 3.75 mg on Wednesdays, and last dose was taken on 5/10.      INR therapeutic on admission Also noted are: -low dose ASA (as PTA) for CAD -ceftriaxone for UTI; broad-spectrum antibiotic may increase INR on warfarin -heart healthy diet; if dietary intake vitamin K intake is less than patients usual, may increase INR on warfarin   Goal of Therapy:  INR 2-3    Plan:  Warfarin 2.5 mg PO x 1 this PM INR daily while inpatient Follow clinical course, monitor for any reports of bleeding  Clayburn Pert, PharmD, BCPS Pager: [phone] 03/12/2017  8:27 AM

## 2017-03-12 NOTE — Progress Notes (Signed)
ANTICOAGULATION CONSULT NOTE - Initial Consult  Pharmacy Consult for Warfarin Indication: hx of VTE  Allergies  Allergen Reactions  . Ambien [Zolpidem Tartrate] Nausea And Vomiting  . Codeine Phosphate Nausea And Vomiting  . Darvocet [Propoxyphene N-Acetaminophen] Nausea And Vomiting  . Promethazine Hcl Nausea And Vomiting  . Sulfamethoxazole Other (See Comments)    Pt doesn't remember reaction  . Vicodin [Hydrocodone-Acetaminophen] Other (See Comments)    Unknown reaction  . Amoxicillin Nausea And Vomiting and Rash  . Penicillins Nausea And Vomiting and Rash    Patient Measurements: Height: 5\' 1"  (154.9 cm) Weight: 146 lb 11.2 oz (66.5 kg) IBW/kg (Calculated) : 47.8 Heparin Dosing Weight:   Vital Signs: Temp: 97.8 F (36.6 C) (05/12 0123) Temp Source: Oral (05/12 0123) BP: 180/58 (05/12 0123) Pulse Rate: 52 (05/12 0123)  Labs:  Recent Labs  03/11/17 2031  HGB 12.0  HCT 36.6  PLT 276  CREATININE 0.96    Estimated Creatinine Clearance: 32 mL/min (by C-G formula based on SCr of 0.96 mg/dL).   Medical History: Past Medical History:  Diagnosis Date  . Anxiety   . Axillary abscess   . CAD (coronary artery disease)    S/p PTCA / stenting (last cath 2004, multiple LAD stents, 2 stents in the right coronary artery all patent)  . Cancer ([place])   . Complication of anesthesia   . Dementia   . GERD (gastroesophageal reflux disease)   . HTN (hypertension)   . PMR (polymyalgia rheumatica) (HCC)   . PONV (postoperative nausea and vomiting)   . Restless leg syndrome   . 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 Started coumadin    Medications:  Prescriptions Prior to Admission  Medication Sig Dispense Refill Last Dose  . acetaminophen (TYLENOL) 500 MG tablet Take 500 mg by mouth 3 (three) times daily as needed for moderate pain (NOT MORE THAN 3,000 MG IN 24 HOUR PERIOD). For pain   unk  . amLODipine (NORVASC) 10 MG tablet Take 10 mg by mouth every morning.     03/11/2017 at 0830  . aspirin EC 81 MG tablet Take 1 tablet (81 mg total) by mouth daily. (Patient taking differently: Take 81 mg by mouth every morning. ) 30 tablet 0 03/12/2017 at 0830  . atorvastatin (LIPITOR) 20 MG tablet Take 10 mg by mouth every evening.    03/10/2017  . cholecalciferol (VITAMIN D) 1000 UNITS tablet Take 2,000 Units by mouth daily with lunch.    03/10/2017  . cholestyramine light (PREVALITE) 4 G packet Take 4 g by mouth daily as needed (for loose bowels).   03/10/2017  . donepezil (ARICEPT) 10 MG tablet Take 10 mg by mouth every morning.   03/11/2017 at 0830  . fluticasone (FLONASE) 50 MCG/ACT nasal spray Place 1 spray into both nostrils daily as needed for allergies or rhinitis.   unk  . loratadine (CLARITIN) 10 MG tablet Take 10 mg by mouth daily as needed for allergies. Reported on 05/03/2016   unk  . LORazepam (ATIVAN) 0.5 MG tablet Take 0.25-0.5 mg by mouth 3 (three) times daily as needed for anxiety.   0 03/10/2017  . losartan (COZAAR) 100 MG tablet Take 50 mg by mouth at bedtime.    03/10/2017  . nitroGLYCERIN (NITROSTAT) 0.4 MG SL tablet Place 0.4 mg under the tongue every 5 (five) minutes as needed for chest pain.   unk  . nystatin (NYSTATIN) powder Apply 1 g topically daily as needed (irrittation).   unk  .  oxybutynin (DITROPAN) 5 MG tablet Take 5 mg by mouth 2 (two) times daily.   0 03/11/2017 at Unknown time  . pantoprazole (PROTONIX) 40 MG tablet Take 40 mg by mouth 2 (two) times daily as needed (for heartburn).   03/11/2017 at 0830  . Pediatric Multiple Vit-C-FA (CHILDRENS CHEWABLE MULTI VITS PO) Take 1 tablet by mouth daily with lunch.    03/10/2017  . potassium chloride SA (K-DUR,KLOR-CON) 20 MEQ tablet Take 20 mEq by mouth daily with lunch.    03/10/2017 at Unknown time  . predniSONE (DELTASONE) 1 MG tablet Take 1 mg by mouth daily with breakfast.   0 03/11/2017 at 0830  . rOPINIRole (REQUIP) 1 MG tablet Take 1 mg by mouth 3 (three) times daily. 1mg  at noon, 1mg  2 hours  before bed, then 1mg  at bedtime   03/03/2017 at Unknown time  . sertraline (ZOLOFT) 25 MG tablet Take 25 mg by mouth every morning.    03/11/2017 at 0830  . SILENOR 6 MG TABS Take 6 mg by mouth at bedtime.   1 03/10/2017  . traMADol (ULTRAM) 50 MG tablet Take 25-50 mg by mouth 3 (three) times daily as needed for moderate pain or severe pain.   03/10/2017 at Unknown time  . warfarin (COUMADIN) 2.5 MG tablet Take 2.5-3.75 mg by mouth every evening. Take 2.5 mg every day except on Wednesday take 3.75 mg   03/10/2017 at 1930   Scheduled:  . acetaminophen  500 mg Oral TID  . amLODipine  10 mg Oral q morning - 10a  . aspirin EC  81 mg Oral Daily  . atorvastatin  10 mg Oral QPM  . donepezil  10 mg Oral q morning - 10a  . doxepin  10 mg Oral QHS  . LORazepam  0.5 mg Oral BID  . losartan  50 mg Oral QHS  . oxybutynin  5 mg Oral BID  . pantoprazole  40 mg Oral Daily  . potassium chloride SA  20 mEq Oral Q lunch  . predniSONE  1 mg Oral Q breakfast  . rOPINIRole  1 mg Oral 3 times per day  . sertraline  25 mg Oral Daily    Assessment: Patient on chronic warfarin for hx of VTE.  INR still not drawn for this admission.    Goal of Therapy:  INR 2-3 Monitor platelets by anticoagulation protocol: Yes   Plan:  Daily INR f/u INR in AM and dose warfarin if needed.  Tyler Deis, Shea Stakes Crowford 03/12/2017,4:18 AM

## 2017-03-12 NOTE — Progress Notes (Signed)
OT Cancellation Note  Patient Details Name: [name] [name] MRN: [id number] DOB: [birth year]   Cancelled Treatment:    Reason Eval/Treat Not Completed: Fatigue/lethargy limiting ability to participate.  Will check back in the am  Xia Stohr 03/12/2017, 3:58 PM  Lesle Chris, OTR/L [phone] 03/12/2017

## 2017-03-12 NOTE — Progress Notes (Signed)
OT Cancellation Note  Patient Details Name: [name] [name] MRN: [id number] DOB: 1921/08/31   Cancelled Treatment:    Reason Eval/Treat Not Completed: Other (comment).  Pt just got back to bed; requests that I check back in a little while.  [name] 03/12/2017, 2:17 PM  Lesle Chris, OTR/L [phone] 03/12/2017

## 2017-03-13 ENCOUNTER — Other Ambulatory Visit: Payer: Self-pay

## 2017-03-13 DIAGNOSIS — S32029D Unspecified fracture of second lumbar vertebra, subsequent encounter for fracture with routine healing: Secondary | ICD-10-CM | POA: Diagnosis not present

## 2017-03-13 DIAGNOSIS — S32029A Unspecified fracture of second lumbar vertebra, initial encounter for closed fracture: Secondary | ICD-10-CM | POA: Diagnosis not present

## 2017-03-13 DIAGNOSIS — N3000 Acute cystitis without hematuria: Secondary | ICD-10-CM

## 2017-03-13 DIAGNOSIS — Z86711 Personal history of pulmonary embolism: Secondary | ICD-10-CM | POA: Diagnosis not present

## 2017-03-13 DIAGNOSIS — R791 Abnormal coagulation profile: Secondary | ICD-10-CM | POA: Diagnosis not present

## 2017-03-13 DIAGNOSIS — K219 Gastro-esophageal reflux disease without esophagitis: Secondary | ICD-10-CM | POA: Diagnosis present

## 2017-03-13 DIAGNOSIS — Z885 Allergy status to narcotic agent status: Secondary | ICD-10-CM | POA: Diagnosis not present

## 2017-03-13 DIAGNOSIS — Z7951 Long term (current) use of inhaled steroids: Secondary | ICD-10-CM | POA: Diagnosis not present

## 2017-03-13 DIAGNOSIS — Z955 Presence of coronary angioplasty implant and graft: Secondary | ICD-10-CM | POA: Diagnosis not present

## 2017-03-13 DIAGNOSIS — M5489 Other dorsalgia: Secondary | ICD-10-CM | POA: Diagnosis not present

## 2017-03-13 DIAGNOSIS — Z853 Personal history of malignant neoplasm of breast: Secondary | ICD-10-CM | POA: Diagnosis not present

## 2017-03-13 DIAGNOSIS — I10 Essential (primary) hypertension: Secondary | ICD-10-CM | POA: Diagnosis not present

## 2017-03-13 DIAGNOSIS — Z7982 Long term (current) use of aspirin: Secondary | ICD-10-CM | POA: Diagnosis not present

## 2017-03-13 DIAGNOSIS — N179 Acute kidney failure, unspecified: Secondary | ICD-10-CM | POA: Diagnosis not present

## 2017-03-13 DIAGNOSIS — G2581 Restless legs syndrome: Secondary | ICD-10-CM | POA: Diagnosis present

## 2017-03-13 DIAGNOSIS — Z86718 Personal history of other venous thrombosis and embolism: Secondary | ICD-10-CM | POA: Diagnosis not present

## 2017-03-13 DIAGNOSIS — F329 Major depressive disorder, single episode, unspecified: Secondary | ICD-10-CM | POA: Diagnosis not present

## 2017-03-13 DIAGNOSIS — R262 Difficulty in walking, not elsewhere classified: Secondary | ICD-10-CM | POA: Diagnosis not present

## 2017-03-13 DIAGNOSIS — Z882 Allergy status to sulfonamides status: Secondary | ICD-10-CM | POA: Diagnosis not present

## 2017-03-13 DIAGNOSIS — I2510 Atherosclerotic heart disease of native coronary artery without angina pectoris: Secondary | ICD-10-CM | POA: Diagnosis present

## 2017-03-13 DIAGNOSIS — M353 Polymyalgia rheumatica: Secondary | ICD-10-CM | POA: Diagnosis present

## 2017-03-13 DIAGNOSIS — E7800 Pure hypercholesterolemia, unspecified: Secondary | ICD-10-CM | POA: Diagnosis present

## 2017-03-13 DIAGNOSIS — Z881 Allergy status to other antibiotic agents status: Secondary | ICD-10-CM | POA: Diagnosis not present

## 2017-03-13 DIAGNOSIS — E785 Hyperlipidemia, unspecified: Secondary | ICD-10-CM | POA: Diagnosis present

## 2017-03-13 DIAGNOSIS — S3992XA Unspecified injury of lower back, initial encounter: Secondary | ICD-10-CM | POA: Diagnosis not present

## 2017-03-13 DIAGNOSIS — F419 Anxiety disorder, unspecified: Secondary | ICD-10-CM | POA: Diagnosis present

## 2017-03-13 DIAGNOSIS — I8290 Acute embolism and thrombosis of unspecified vein: Secondary | ICD-10-CM | POA: Diagnosis not present

## 2017-03-13 DIAGNOSIS — M549 Dorsalgia, unspecified: Secondary | ICD-10-CM | POA: Diagnosis not present

## 2017-03-13 DIAGNOSIS — N390 Urinary tract infection, site not specified: Secondary | ICD-10-CM | POA: Diagnosis not present

## 2017-03-13 DIAGNOSIS — D689 Coagulation defect, unspecified: Secondary | ICD-10-CM | POA: Diagnosis not present

## 2017-03-13 DIAGNOSIS — R488 Other symbolic dysfunctions: Secondary | ICD-10-CM | POA: Diagnosis not present

## 2017-03-13 DIAGNOSIS — Z9181 History of falling: Secondary | ICD-10-CM | POA: Diagnosis not present

## 2017-03-13 DIAGNOSIS — Z880 Allergy status to penicillin: Secondary | ICD-10-CM | POA: Diagnosis not present

## 2017-03-13 DIAGNOSIS — D649 Anemia, unspecified: Secondary | ICD-10-CM | POA: Diagnosis not present

## 2017-03-13 DIAGNOSIS — M6281 Muscle weakness (generalized): Secondary | ICD-10-CM | POA: Diagnosis not present

## 2017-03-13 DIAGNOSIS — S32020A Wedge compression fracture of second lumbar vertebra, initial encounter for closed fracture: Secondary | ICD-10-CM | POA: Diagnosis not present

## 2017-03-13 DIAGNOSIS — Z888 Allergy status to other drugs, medicaments and biological substances status: Secondary | ICD-10-CM | POA: Diagnosis not present

## 2017-03-13 DIAGNOSIS — F0390 Unspecified dementia without behavioral disturbance: Secondary | ICD-10-CM | POA: Diagnosis not present

## 2017-03-13 DIAGNOSIS — Z7901 Long term (current) use of anticoagulants: Secondary | ICD-10-CM | POA: Diagnosis not present

## 2017-03-13 DIAGNOSIS — W1830XA Fall on same level, unspecified, initial encounter: Secondary | ICD-10-CM | POA: Diagnosis present

## 2017-03-13 LAB — BASIC METABOLIC PANEL
Anion gap: 10 (ref 5–15)
BUN: 14 mg/dL (ref 4–21)
BUN: 14 mg/dL (ref 6–20)
CHLORIDE: 109 mmol/L (ref 101–111)
CO2: 21 mmol/L — AB (ref 22–32)
CREATININE: 1 mg/dL (ref 0.5–1.1)
Calcium: 9 mg/dL (ref 8.9–10.3)
Creatinine, Ser: 0.99 mg/dL (ref 0.44–1.00)
GFR calc non Af Amer: 48 mL/min — ABNORMAL LOW (ref >60)
GFR, EST AFRICAN AMERICAN: 55 mL/min — AB (ref >60)
Glucose, Bld: 118 mg/dL — ABNORMAL HIGH (ref 65–99)
Glucose: 118 mg/dL
POTASSIUM: 3.5 mmol/L (ref 3.4–5.3)
POTASSIUM: 3.5 mmol/L (ref 3.5–5.1)
SODIUM: 140 mmol/L (ref 135–145)
Sodium: 140 mmol/L (ref 137–147)

## 2017-03-13 LAB — CBC WITH DIFFERENTIAL/PLATELET
Basophils Absolute: 0.1 10*3/uL (ref 0.0–0.1)
Basophils Relative: 1 %
EOS ABS: 0.8 10*3/uL — AB (ref 0.0–0.7)
Eosinophils Relative: 7 %
HEMATOCRIT: 38.6 % (ref 36.0–46.0)
HEMOGLOBIN: 12.7 g/dL (ref 12.0–15.0)
LYMPHS ABS: 1.9 10*3/uL (ref 0.7–4.0)
LYMPHS PCT: 19 %
MCH: 27.8 pg (ref 26.0–34.0)
MCHC: 32.9 g/dL (ref 30.0–36.0)
MCV: 84.5 fL (ref 78.0–100.0)
MONOS PCT: 8 %
Monocytes Absolute: 0.9 10*3/uL (ref 0.1–1.0)
NEUTROS ABS: 6.5 10*3/uL (ref 1.7–7.7)
NEUTROS PCT: 65 %
Platelets: 299 10*3/uL (ref 150–400)
RBC: 4.57 MIL/uL (ref 3.87–5.11)
RDW: 15 % (ref 11.5–15.5)
WBC: 10.1 10*3/uL (ref 4.0–10.5)

## 2017-03-13 LAB — TROPONIN I: TROPONIN I: 0.03 ng/mL — AB (ref <0.03)

## 2017-03-13 LAB — PROTIME-INR
INR: 2.64
Prothrombin Time: 28.7 seconds — ABNORMAL HIGH (ref 11.4–15.2)

## 2017-03-13 LAB — CBC AND DIFFERENTIAL
HCT: 39 % (ref 36–46)
Hemoglobin: 12.7 g/dL (ref 12.0–16.0)
Platelets: 299 10*3/uL (ref 150–399)
WBC: 10.1 10*3/mL

## 2017-03-13 MED ORDER — FENTANYL CITRATE (PF) 100 MCG/2ML IJ SOLN: 12.5000 ug | INTRAMUSCULAR | Status: DC | PRN

## 2017-03-13 MED ORDER — POLYETHYLENE GLYCOL 3350 17 G PO PACK
17.0000 g | PACK | Freq: Two times a day (BID) | ORAL | Status: DC
  Administered 2017-03-13 – 2017-03-15 (×2): 17 g via ORAL
  Filled 2017-03-13 (×3): qty 1

## 2017-03-13 MED ORDER — WARFARIN SODIUM 2.5 MG PO TABS
2.5000 mg | ORAL_TABLET | Freq: Once | ORAL | Status: AC
  Administered 2017-03-13: 2.5 mg via ORAL
  Filled 2017-03-13: qty 1

## 2017-03-13 MED ORDER — HYDRALAZINE HCL 25 MG PO TABS
25.0000 mg | ORAL_TABLET | Freq: Three times a day (TID) | ORAL | Status: DC
  Administered 2017-03-13 – 2017-03-16 (×8): 25 mg via ORAL
  Filled 2017-03-13 (×9): qty 1

## 2017-03-13 MED ORDER — HYDRALAZINE HCL 20 MG/ML IJ SOLN
10.0000 mg | Freq: Once | INTRAMUSCULAR | Status: AC
  Administered 2017-03-13: 10 mg via INTRAVENOUS
  Filled 2017-03-13: qty 1

## 2017-03-13 MED ORDER — BISACODYL 5 MG PO TBEC
5.0000 mg | DELAYED_RELEASE_TABLET | Freq: Every day | ORAL | Status: AC
  Administered 2017-03-13 – 2017-03-15 (×3): 5 mg via ORAL
  Filled 2017-03-13 (×3): qty 1

## 2017-03-13 NOTE — Progress Notes (Signed)
Paged MD and made aware of troponin.Pt asleep.wbb

## 2017-03-13 NOTE — Progress Notes (Signed)
Nursing Note: EKG nl.Rapid response nurse is at the bedside.wbb

## 2017-03-13 NOTE — Significant Event (Signed)
Rapid Response Event Note  Overview: Time Called: 0200 Arrival Time: 0204 Event Type: Cardiac Called by Bedside RN in regards to patient having some discomfort in chest "chest feels full ". Patient's BP has been elevated 340B-524E systolic (At 185 909/31). Upon arrival, bedside RN had called MD and MD ordered STAT EKG and Troponin to be drawn. Initial Focused Assessment: Patient awake upon entering the room. Neuro: Patient alert and oriented x 4. Pupils are equal, round, intact and respond to light. Able to follow commands, moves extremities with ease. Pain: Patient having lower back pain with movement.  Respiratory: Lung fields bilaterally clear and diminished, Respirations 17-18, chest rise not labored and symmetrical. Cardiac: S1 and S2 sounds present and regular. Patient NSR at this time. Pulses 1-2+ in all extremities bilaterally  Patient is HTN and has history see Vitals. Interventions: Notified MD of Hypertension. EKG obtained and results appear Within Normal Limits, no abnormality seen. Ultram 50mg  given for lower back pain. Troponin still to be obtained.  Plan of Care (if not transferred): For HTN give Hydralazine 10mg  IV once for BP. Call MD if BP still remains elevated. Monitor Troponin and report elevation to MD. Will communicate this to bedside RN.   Event Summary:   at   [facility] MD [place] in regards to elevated BP.          Kysa Calais C

## 2017-03-13 NOTE — Evaluation (Signed)
Occupational Therapy Evaluation Patient Details Name: [name] [name] MRN: [id number] DOB: 1920-12-11 Today's Date: 03/13/2017    History of Present Illness [name] [name] is a 83 y.o. woman with a history of VTE (anticoagulated with warfarin), Polymyalgia rheumatica, restless leg syndrome, HTN, GERD, CAD S/P multiple stents, and breast cancer who also has a known history of prior compression fractures.  She had an accidental fall on Monday . CT shows L2, T9, T12 compression fxz, indeterminant age.   Clinical Impression   Pt admitted with above. She demonstrates the below listed deficits and will benefit from continued OT to maximize safety and independence with BADLs.  Pt requires mod - max A for ADLs and min A for functional mobility.  She required assist for bathing and dressing PTA, but was able to ambulate to BR and perform toileting with supervision.   Feel she would benefit from SNF level rehab to allow her to maximize safety and independence with ADLs to reduce risk of falls.  Will follow acutely.       Follow Up Recommendations  SNF    Equipment Recommendations  None recommended by OT    Recommendations for Other Services       Precautions / Restrictions Precautions Precautions: Back Precaution Comments: Pt requires max cues for precautions       Mobility Bed Mobility Overal bed mobility: Needs Assistance Bed Mobility: Rolling;Sidelying to Sit;Sit to Sidelying Rolling: Mod assist Sidelying to sit: Mod assist     Sit to sidelying: Mod assist General bed mobility comments: cues to log roll and assist for LEs and trunk   Transfers Overall transfer level: Needs assistance Equipment used: Rolling walker (2 wheeled) Transfers: Sit to/from [facility] Sit to Stand: Min assist Stand pivot transfers: Min assist       General transfer comment: cues for hand placement and safety     Balance                                            ADL either performed or assessed with clinical judgement   ADL Overall ADL's : Needs assistance/impaired Eating/Feeding: Independent   Grooming: Wash/dry hands;Wash/dry face;Oral care;Brushing hair;Minimal assistance;Sitting   Upper Body Bathing: Minimal assistance;Sitting   Lower Body Bathing: Maximal assistance;Sit to/from stand   Upper Body Dressing : Moderate assistance;Sitting   Lower Body Dressing: Total assistance;Sit to/from stand   Toilet Transfer: Minimal assistance;Ambulation;BSC;RW   Toileting- Clothing Manipulation and Hygiene: Maximal assistance;Sit to/from stand       Functional mobility during ADLs: Minimal assistance;Rolling walker       Vision         Perception     Praxis      Pertinent Vitals/Pain Pain Assessment: Faces Faces Pain Scale: Hurts little more Pain Location: back Pain Descriptors / Indicators: Aching;Grimacing Pain Intervention(s): Monitored during session;Premedicated before session     Hand Dominance Right   Extremity/Trunk Assessment Upper Extremity Assessment Upper Extremity Assessment: Generalized weakness   Lower Extremity Assessment Lower Extremity Assessment: Defer to PT evaluation   Cervical / Trunk Assessment Cervical / Trunk Assessment: Kyphotic   Communication Communication Communication: No difficulties   Cognition Arousal/Alertness: Awake/alert Behavior During Therapy: WFL for tasks assessed/performed Overall Cognitive Status: Within Functional Limits for tasks assessed  General Comments       Exercises     Shoulder Instructions      Home Living Family/patient expects to be discharged to:: Skilled nursing facility Living Arrangements: Children Available Help at Discharge: Family Type of Home: House Home Access: Stairs to enter Technical brewer of Steps: 2 Entrance Stairs-Rails: Right Home Layout: One level               Home  Equipment: Walker - 4 wheels          Prior Functioning/Environment Level of Independence: Needs assistance  Gait / Transfers Assistance Needed: Pt reports she uses RW ADL's / Homemaking Assistance Needed: Pt reports she has an aide that assists with all ADLs.  She toilets mod I.             OT Problem List: Decreased strength;Decreased activity tolerance;Impaired balance (sitting and/or standing);Decreased knowledge of use of DME or AE;Pain      OT Treatment/Interventions: Self-care/ADL training;DME and/or AE instruction;Therapeutic activities;Patient/family education;Balance training    OT Goals(Current goals can be found in the care plan section) Acute Rehab OT Goals Patient Stated Goal: to go to [facility] OT Goal Formulation: With patient Time For Goal Achievement: 03/27/17 Potential to Achieve Goals: Good ADL Goals Pt Will Perform Grooming: with min guard assist;standing Pt Will Transfer to Toilet: with min guard assist;ambulating;regular height toilet;bedside commode;grab bars Pt Will Perform Toileting - Clothing Manipulation and hygiene: with min guard assist;sit to/from stand  OT Frequency: Min 2X/week   Barriers to D/C: Decreased caregiver support          Co-evaluation              AM-PAC PT "6 Clicks" Daily Activity     Outcome Measure Help from another person eating meals?: None Help from another person taking care of personal grooming?: A Little Help from another person toileting, which includes using toliet, bedpan, or urinal?: A Lot Help from another person bathing (including washing, rinsing, drying)?: A Lot Help from another person to put on and taking off regular upper body clothing?: A Lot Help from another person to put on and taking off regular lower body clothing?: A Lot 6 Click Score: 15   End of Session Equipment Utilized During Treatment: Rolling walker Nurse Communication: Mobility status  Activity Tolerance: Patient limited by  pain;Patient limited by fatigue Patient left: in bed;with call bell/phone within reach;with bed alarm set  OT Visit Diagnosis: Unsteadiness on feet (R26.81);Pain Pain - part of body:  (back)                Time: 1212-1232 OT Time Calculation (min): 20 min Charges:  OT General Charges $OT Visit: 1 Procedure OT Evaluation $OT Eval Moderate Complexity: 1 Procedure G-Codes: OT G-codes **NOT FOR INPATIENT CLASS** Functional Assessment Tool Used: AM-PAC 6 Clicks Daily Activity Functional Limitation: Self care Self Care Current Status (Z6109): At least 40 percent but less than 60 percent impaired, limited or restricted Self Care Goal Status (U0454): At least 40 percent but less than 60 percent impaired, limited or restricted   [facility], OTR/L [phone]   Lucille Passy M 03/13/2017, 12:41 PM

## 2017-03-13 NOTE — Care Management Obs Status (Signed)
[place] NOTIFICATION   Patient Details  Name: [name] [name] MRN: [id number] Date of Birth: 1920-12-15   Medicare Observation Status Notification Given:  Yes    Erenest Rasher, RN 03/13/2017, 10:46 AM

## 2017-03-13 NOTE — Progress Notes (Signed)
Nursing Note: Obtained order for EKG and troponin.wbb

## 2017-03-13 NOTE — NC FL2 (Addendum)
[place] LEVEL OF CARE SCREENING TOOL     IDENTIFICATION  Patient Name: [name] [name] Birthdate: 02-Feb-1921 Sex: female Admission Date (Current Location): 03/11/2017  [facility] and [place] Number:  Herbalist and Address:  [facility],  [address] [place], [place]      Provider Number: [id number]  Attending Physician Name and Address:  Tawni Millers  Relative Name and Phone Number:       Current Level of Care: Hospital Recommended Level of Care: [place] Prior Approval Number:    Date Approved/Denied:   PASRR Number: [id number]  Discharge Plan: SNF    Current Diagnoses: Patient Active Problem List   Diagnosis Date Noted  . Compression fracture of L2 ([place]) 03/12/2017  . Acute lower UTI 03/12/2017  . Accelerated hypertension 03/12/2017  . Leukocytosis 07/31/2015  . Acute bronchitis 07/28/2015  . Chronic anticoagulation 07/28/2015  . Intractable back pain 07/20/2015  . Back pain 07/20/2015  . Cellulitis and abscess of left buttock 04/12/2013  . Stricture and stenosis of esophagus 10/10/2012  . Dementia 10/06/2012  . Dysphagia 10/06/2012  . Axillary abscess - bilateral, multiple 09/27/2012  . Hypotension 08/24/2012  . Near syncope 08/24/2012  . Abscess of axilla, left 06/27/2012  . Bradycardia 06/13/2012  . Abscess 06/11/2012  . Chest pain 06/09/2012  . Cellulitis 06/09/2012  . Weakness of both legs 06/09/2012  . PMR (polymyalgia rheumatica) (HCC)   . Restless leg syndrome   . Angina pectoris, unstable ([place]) 03/02/2012  . Anemia 03/02/2012  . Dyspnea 02/03/2012  . UTI (urinary tract infection) 02/03/2012  . CAD (coronary artery disease)   . HTN (hypertension)   . VTE (venous thromboembolism) 10/01/2010  . HYPERCHOLESTEROLEMIA  IIA 08/27/2009  . Hyperlipidemia 12/11/2008  . HYPERTENSION, BENIGN 12/11/2008    Orientation RESPIRATION BLADDER Height & Weight     Self, Time,  Situation, Place  Normal Continent Weight: 146 lb 11.2 oz (66.5 kg) Height:  5\' 1"  (154.9 cm)  BEHAVIORAL SYMPTOMS/MOOD NEUROLOGICAL BOWEL NUTRITION STATUS      Continent Diet (heart healty fluid consistency thin)  AMBULATORY STATUS COMMUNICATION OF NEEDS Skin   Limited Assist Verbally incontinence skin damage Wound type: Moisture associated incontinence dermititis Pressure Injury POA: N/A Measurement:na Wound bed:na Drainage (amount, consistency, odor) na Periwound: intact Dressing procedure/placement/frequency: I have provided nurses with orders for Using critic-aid purple top barrier after incontinence cleanser after each incontinence episode.  Allevyn foam dressing applied to sacrum for protection, no wound present.                     Personal Care Assistance Level of Assistance  Bathing, Dressing Bathing Assistance: Limited assistance   Dressing Assistance: Limited assistance     Functional Limitations Info             SPECIAL CARE FACTORS FREQUENCY  PT (By licensed PT), OT (By licensed OT)     PT Frequency: 5 days week OT Frequency: 5 days week            Contractures      Additional Factors Info  Code Status, Allergies (full code) Code Status Info: full code Allergies Info: Ambien Zolpidem Tartrate, Codeine Phosphate, Darvocet Propoxyphene N-acetaminophen, Promethazine Hcl, Sulfamethoxazole, Vicodin Hydrocodone-acetaminophen, Amoxicillin, Penicillins           Current Medications (03/13/2017):  This is the current hospital active medication list Current Facility-Administered Medications  Medication Dose Route Frequency Provider Last Rate Last Dose  .  acetaminophen (TYLENOL) tablet 650 mg  650 mg Oral Q6H PRN Lily Kocher, MD       Or  . acetaminophen (TYLENOL) suppository 650 mg  650 mg Rectal Q6H PRN Lily Kocher, MD      . acetaminophen (TYLENOL) tablet 500 mg  500 mg Oral TID Lily Kocher, MD   500 mg at 03/13/17 0940  . amLODipine (NORVASC)  tablet 10 mg  10 mg Oral q morning - 10a Lily Kocher, MD   10 mg at 03/13/17 3875  . aspirin EC tablet 81 mg  81 mg Oral Daily Lily Kocher, MD   81 mg at 03/13/17 6433  . atorvastatin (LIPITOR) tablet 10 mg  10 mg Oral QPM Lily Kocher, MD   10 mg at 03/12/17 1746  . bisacodyl (DULCOLAX) EC tablet 5 mg  5 mg Oral Daily Arrien, Jimmy Picket, MD   5 mg at 03/13/17 1000  . cefTRIAXone (ROCEPHIN) 1 g in dextrose 5 % 50 mL IVPB  1 g Intravenous Q24H Lily Kocher, MD   Stopped at 03/12/17 2242  . donepezil (ARICEPT) tablet 10 mg  10 mg Oral q morning - 10a Lily Kocher, MD   10 mg at 03/13/17 2951  . doxepin (SINEQUAN) capsule 10 mg  10 mg Oral QHS Lily Kocher, MD   10 mg at 03/12/17 2211  . fentaNYL (SUBLIMAZE) injection 12.5 mcg  12.5 mcg Intravenous Q4H PRN Arrien, Jimmy Picket, MD      . hydrALAZINE (APRESOLINE) tablet 25 mg  25 mg Oral Q8H Arrien, Jimmy Picket, MD      . ibuprofen (ADVIL,MOTRIN) tablet 200 mg  200 mg Oral Q4H PRN Arrien, Jimmy Picket, MD      . LORazepam (ATIVAN) tablet 0.5 mg  0.5 mg Oral BID Lily Kocher, MD   0.5 mg at 03/13/17 0940  . losartan (COZAAR) tablet 50 mg  50 mg Oral QHS Lily Kocher, MD   50 mg at 03/12/17 2210  . ondansetron (ZOFRAN) tablet 4 mg  4 mg Oral Q6H PRN Lily Kocher, MD       Or  . ondansetron [facility]) injection 4 mg  4 mg Intravenous Q6H PRN Lily Kocher, MD      . oxybutynin (DITROPAN) tablet 5 mg  5 mg Oral BID Lily Kocher, MD   5 mg at 03/13/17 8841  . pantoprazole (PROTONIX) EC tablet 40 mg  40 mg Oral Daily Lily Kocher, MD   40 mg at 03/13/17 0939  . polyethylene glycol (MIRALAX / GLYCOLAX) packet 17 g  17 g Oral BID Arrien, Jimmy Picket, MD   17 g at 03/13/17 1000  . potassium chloride SA (K-DUR,KLOR-CON) CR tablet 20 mEq  20 mEq Oral Q lunch Lily Kocher, MD   20 mEq at 03/13/17 1200  . predniSONE (DELTASONE) tablet 1 mg  1 mg Oral Q breakfast Lily Kocher, MD   1 mg at 03/13/17 0844  . rOPINIRole (REQUIP) tablet  1 mg  1 mg Oral 3 times per day Lily Kocher, MD   1 mg at 03/13/17 1200  . sertraline (ZOLOFT) tablet 25 mg  25 mg Oral Daily Lily Kocher, MD   25 mg at 03/13/17 0940  . warfarin (COUMADIN) tablet 2.5 mg  2.5 mg Oral ONCE-1800 Absher, Julieta Bellini, RPH      . Warfarin - Pharmacist Dosing Inpatient   Does not apply q1800 Lily Kocher, MD         Discharge Medications: Please see discharge summary for a  list of discharge medications.  Relevant Imaging Results:  Relevant Lab Results:   Additional Information [id number]  Carlean Jews, LCSW

## 2017-03-13 NOTE — Progress Notes (Signed)
Nursing Note: Pt called for pain med.C/o back pain and fullness in her chest.BP 190/66 P-70 R-16 PO2 96% on r/a:: Pt medicated for pain.Pt denies chest pain but says her chest " feels full"A:: Paged on-call.wbb

## 2017-03-13 NOTE — Progress Notes (Signed)
PROGRESS NOTE    [name] [name]  TMH:[id number] DOB: 11-12-1920 DOA: 03/11/2017 PCP: Deland Pretty, MD    Brief Narrative:  83 yo female presented with the chief complain of intractable back pain and decreased mobility. 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 Had mechanical fall 6 days prior to admission, then developed severe pain in her mid-back. Worsening symptoms despite po analgesics to the point where patient became not ambulatory and unable to do her activities of daily living including feeding herself. On the initial physical examination patient hemodynamically stable, moist mucous membranes, lungs clear to ascultation, rhythmic S1 and S2, abdomen soft with no lower extremity edema or deformity. Spine CT with new subacute L2 superior endplate compression fracture and chronic compression deformities at T9, T12 and L3.  Patient admitted with the working diagnosis of acute ambulatory dysfunction due L2 subacute compression fracture complicated with urine infection.    Assessment & Plan:   Principal Problem:   Intractable back pain Active Problems:   CAD (coronary artery disease)   VTE (venous thromboembolism)   Chronic anticoagulation   Compression fracture of L2 (HCC)   Acute lower UTI   Accelerated hypertension   1. L2 subacute compression fracture. Good pain control with acetaminophen, tramadol and ibuprofen, will continyue low dose of fentanyl, as needed. Patient of tramadol, tolerating well. Patient will need SNF at discharge. .   2. Urine infection. Antibiotic therapy with ceftriaxone #1. In the past urine culture had e coli  3. Polymyalgia rheumatica. Continue low dose prednisone, continue physical therapy.   4. HTN. Uncontrolled with Amlodipine 10 mg and losartan 50 mg. Systolic 921 to 194, will start patient on hydralazine 25 mg po tid.   5. History of massive pulmonary embolism.  Massive PE in 2011. For now warfarin  therapy indefinitely. INR at 2,6.  6. Dementia. On sertraline, lorazepam and donepezil. Patient will need assistance at discharge, will plan for SNF.    DVT prophylaxis: warfarin  Code Status: Full  Family Communication: No family at the bedside  Disposition Plan: Home    Consultants:     Procedures:    Antimicrobials:    ceftriaxone    Subjective: Patient had chest pain last night, rapid response was called. This am patient complains of constipation, moderate to severe, with no associated nausea or vomiting. Positive back pain, dull in nature, moderate in intensity and worse with movement.   Objective: Vitals:   03/13/17 0209 03/13/17 0214 03/13/17 0219 03/13/17 0522  BP: (!) 183/69 (!) 182/68 (!) 187/64 (!) 121/55  Pulse:    65  Resp:    16  Temp:    97.9 F (36.6 C)  TempSrc:    Oral  SpO2: 100% 100% 100% 97%  Weight:      Height:        Intake/Output Summary (Last 24 hours) at 03/13/17 0924 Last data filed at 03/12/17 1942  Gross per 24 hour  Intake             1560 ml  Output                0 ml  Net             1560 ml   Filed Weights   03/11/17 1813 03/12/17 0123  Weight: 62.1 kg (137 lb) 66.5 kg (146 lb 11.2 oz)    Examination:  General exam: deconditioned E ENT: mild pallor, no icterus, oral mucosa moist.  Respiratory system: Clear to auscultation. Respiratory effort normal. Decreased breath sounds at bases due to poor inspiratory effort.  Cardiovascular system: S1 & S2 heard, RRR. No JVD, murmurs, rubs, gallops or clicks. No pedal edema. Gastrointestinal system: Abdomen is nondistended, soft and nontender. No organomegaly or masses felt. Normal bowel sounds heard. Central nervous system: Alert and oriented. No focal neurological deficits. Extremities: Symmetric 5 x 5 power. Skin: No rashes, lesions or ulcers    Data Reviewed: I have personally reviewed following labs and imaging studies  CBC:  Recent Labs Lab 03/11/17 2031  03/12/17 0437 03/13/17 0255  WBC 11.2* 9.5 10.1  NEUTROABS 8.2*  --  6.5  HGB 12.0 10.8* 12.7  HCT 36.6 33.0* 38.6  MCV 84.7 84.4 84.5  PLT 276 264 903   Basic Metabolic Panel:  Recent Labs Lab 03/11/17 2031 03/13/17 0255  NA 137 140  K 3.5 3.5  CL 105 109  CO2 22 21*  GLUCOSE 104* 118*  BUN 13 14  CREATININE 0.96 0.99  CALCIUM 8.8* 9.0   GFR: Estimated Creatinine Clearance: 31 mL/min (by C-G formula based on SCr of 0.99 mg/dL). Liver Function Tests:  Recent Labs Lab 03/11/17 2031  AST 17  ALT 14  ALKPHOS 60  BILITOT 0.5  PROT 6.7  ALBUMIN 3.4*   No results for input(s): LIPASE, AMYLASE in the last 168 hours. No results for input(s): AMMONIA in the last 168 hours. Coagulation Profile:  Recent Labs Lab 03/12/17 0437 03/13/17 0255  INR 2.71 2.64   Cardiac Enzymes:  Recent Labs Lab 03/13/17 0255  TROPONINI 0.03*   BNP (last 3 results) No results for input(s): PROBNP in the last 8760 hours. HbA1C: No results for input(s): HGBA1C in the last 72 hours. CBG: No results for input(s): GLUCAP in the last 168 hours. Lipid Profile: No results for input(s): CHOL, HDL, LDLCALC, TRIG, CHOLHDL, LDLDIRECT in the last 72 hours. Thyroid Function Tests: No results for input(s): TSH, T4TOTAL, FREET4, T3FREE, THYROIDAB in the last 72 hours. Anemia Panel: No results for input(s): VITAMINB12, FOLATE, FERRITIN, TIBC, IRON, RETICCTPCT in the last 72 hours. Sepsis Labs: No results for input(s): PROCALCITON, LATICACIDVEN in the last 168 hours.  Recent Results (from the past 240 hour(s))  Culture, Urine     Status: Abnormal (Preliminary result)   Collection Time: 03/11/17  9:30 PM  Result Value Ref Range Status   Specimen Description URINE, CLEAN CATCH  Final   Special Requests NONE  Final   Culture >=100,000 COLONIES/mL GRAM NEGATIVE RODS (A)  Final   Report Status PENDING  Incomplete         Radiology Studies: Dg Chest 2 View  Result Date:  03/11/2017 CLINICAL DATA:  Fall 4 days prior with severe low back pain. History of breast cancer. EXAM: CHEST  2 VIEW COMPARISON:  Frontal and lateral views 05/27/2016 FINDINGS: Unchanged elevation of right hemidiaphragm allowing for differences in technique. Stable heart size and mediastinal contours. Tortuosity atherosclerosis of the thoracic aorta. Minimal bibasilar atelectasis or scarring. No consolidation, pleural effusion, pneumothorax or pulmonary edema. The bones are under mineralized. No grossly displaced rib fracture. IMPRESSION: No acute abnormality.  Chronic elevation of right hemidiaphragm. Tortuous thoracic aorta with atherosclerosis. Electronically Signed   By: Jeb Levering M.D.   On: 03/11/2017 21:29   Ct Thoracic Spine Wo Contrast  Result Date: 03/11/2017 CLINICAL DATA:  Fall with back pain.  History of breast cancer. EXAM: CT THORACIC AND LUMBAR SPINE WITHOUT CONTRAST TECHNIQUE: Multidetector CT imaging of  the thoracic and lumbar spine was performed without contrast. Multiplanar CT image reconstructions were also generated. COMPARISON:  Lumbar spine radiograph 03/08/2017 Thoracic spine CT 11/14/2015 Lumbar spine MRI 07/20/2015 FINDINGS: CT THORACIC SPINE FINDINGS Alignment: Normal Vertebrae: There are compression deformities of the T9 and T12 vertebrae, both of which are chronic and unchanged compared to 11/14/2015. Paraspinal and other soft tissues: Aortic atherosclerosis. Otherwise unremarkable. Disc levels: No bony spinal canal stenosis. Intervertebral disc spaces are maintained. No evidence of traumatic herniation. CT LUMBAR SPINE FINDINGS Segmentation: Normal Alignment: Normal Vertebrae: Compression deformity of the L3 vertebral body is unchanged compared to MRI of 07/20/2015. L2 compression deformity is new compared to this MR and the CT of 11/14/2015, but unchanged compared to the recent radiograph of 03/08/2017. There is no adjacent hematoma or soft tissue abnormality. There is gas  in the disc space and underlying the endplate. Paraspinal and other soft tissues: Aortic atherosclerosis. Disc levels: No evidence of traumatic disc herniation. No bony spinal canal stenosis. There is bilateral L4-L5 and L5-S1 neural foraminal narrowing, worst at right L4-5. IMPRESSION: 1. L2 superior endplate compression fracture is age indeterminate. Given the presence of an intravertebral vacuum cleft, this is favored to be a subacute fracture (within 2 weeks). Recommend correlation with history of trauma). 2. Chronic compression deformities at T9, T12 and L3. 3. No bony spinal canal stenosis or evidence of traumatic disc herniation. 4. Aortic atherosclerosis. Electronically Signed   By: Ulyses Jarred M.D.   On: 03/11/2017 21:29   Ct Lumbar Spine Wo Contrast  Result Date: 03/11/2017 CLINICAL DATA:  Fall with back pain.  History of breast cancer. EXAM: CT THORACIC AND LUMBAR SPINE WITHOUT CONTRAST TECHNIQUE: Multidetector CT imaging of the thoracic and lumbar spine was performed without contrast. Multiplanar CT image reconstructions were also generated. COMPARISON:  Lumbar spine radiograph 03/08/2017 Thoracic spine CT 11/14/2015 Lumbar spine MRI 07/20/2015 FINDINGS: CT THORACIC SPINE FINDINGS Alignment: Normal Vertebrae: There are compression deformities of the T9 and T12 vertebrae, both of which are chronic and unchanged compared to 11/14/2015. Paraspinal and other soft tissues: Aortic atherosclerosis. Otherwise unremarkable. Disc levels: No bony spinal canal stenosis. Intervertebral disc spaces are maintained. No evidence of traumatic herniation. CT LUMBAR SPINE FINDINGS Segmentation: Normal Alignment: Normal Vertebrae: Compression deformity of the L3 vertebral body is unchanged compared to MRI of 07/20/2015. L2 compression deformity is new compared to this MR and the CT of 11/14/2015, but unchanged compared to the recent radiograph of 03/08/2017. There is no adjacent hematoma or soft tissue abnormality.  There is gas in the disc space and underlying the endplate. Paraspinal and other soft tissues: Aortic atherosclerosis. Disc levels: No evidence of traumatic disc herniation. No bony spinal canal stenosis. There is bilateral L4-L5 and L5-S1 neural foraminal narrowing, worst at right L4-5. IMPRESSION: 1. L2 superior endplate compression fracture is age indeterminate. Given the presence of an intravertebral vacuum cleft, this is favored to be a subacute fracture (within 2 weeks). Recommend correlation with history of trauma). 2. Chronic compression deformities at T9, T12 and L3. 3. No bony spinal canal stenosis or evidence of traumatic disc herniation. 4. Aortic atherosclerosis. Electronically Signed   By: Ulyses Jarred M.D.   On: 03/11/2017 21:29        Scheduled Meds: . acetaminophen  500 mg Oral TID  . amLODipine  10 mg Oral q morning - 10a  . aspirin EC  81 mg Oral Daily  . atorvastatin  10 mg Oral QPM  . donepezil  10 mg  Oral q morning - 10a  . doxepin  10 mg Oral QHS  . LORazepam  0.5 mg Oral BID  . losartan  50 mg Oral QHS  . oxybutynin  5 mg Oral BID  . pantoprazole  40 mg Oral Daily  . potassium chloride SA  20 mEq Oral Q lunch  . predniSONE  1 mg Oral Q breakfast  . rOPINIRole  1 mg Oral 3 times per day  . sertraline  25 mg Oral Daily  . Warfarin - Pharmacist Dosing Inpatient   Does not apply q1800   Continuous Infusions: . cefTRIAXone (ROCEPHIN)  IV Stopped (03/12/17 2242)     LOS: 0 days       Mauricio Gerome Apley, MD [facility] Pager [phone]  If 7PM-7AM, please contact night-coverage [email or web address] Password [facility] 03/13/2017, 9:24 AM

## 2017-03-13 NOTE — Care Management Note (Signed)
Case Management Note  Patient Details  Name: [name] [name] MRN: [id number] Date of Birth: August 18, 1921  Subjective/Objective:    Low back pain, HTN                Action/Plan: Discharge Planning: NCM spoke to pt and lives with son, [name] [name] # [phone]. Pt states she does want to go to rehab. Contacted CSW for SNF-rehab. Has RW at home.   PCP Deland Pretty MD   Expected Discharge Date:  03/15/17               Expected Discharge Plan:  [place]  In-House Referral:  Clinical Social Work  Discharge planning Services  CM Consult  Post Acute Care Choice:  NA Choice offered to:  NA  DME Arranged:  N/A DME Agency:  NA  HH Arranged:  NA HH Agency:  NA  Status of Service:  Completed, signed off  If discussed at [place] of Stay Meetings, dates discussed:    Additional Comments:  Erenest Rasher, RN 03/13/2017, 10:51 AM

## 2017-03-13 NOTE — Clinical Social Work Note (Signed)
Patient choices for rehab bed are as follows: [facility], [facility], and [place]. LCSW sent info through the hub. Possible d/c Monday   .Dede Query, LCSW [facility] Clinical Social Worker - Weekend Coverage cell #: [phone]

## 2017-03-13 NOTE — Clinical Social Work Note (Signed)
Clinical Social Work Assessment  Patient Details  Name: [name] [name] MRN: [id number] Date of Birth: 02/06/1921  Date of referral:  03/13/17               Reason for consult:  Facility Placement                Permission sought to share information with:  Facility Contact Representative, Family Supports Permission granted to share information::  Yes, Verbal Permission Granted  Name::        Agency::     Relationship::     Contact Information:     Housing/Transportation Living arrangements for the past 2 months:  Single Family Home Source of Information:  Adult Children (son richard) Patient Interpreter Needed:  None Criminal Activity/Legal Involvement Pertinent to Current Situation/Hospitalization:    Significant Relationships:  Adult Children Lives with:  Adult Children Do you feel safe going back to the place where you live?  No Need for family participation in patient care:  Yes (Comment)  Care giving concerns:  None reported   Social Worker assessment / plan:  LCSW met with patient and her son at bedside to gather information regarding history and current needs.  LCSW sent patient's information to SNF's and also sent paperwork through the fax to [employer] for authorization.   Employment status:  Retired Insurance information:  Managed Medicare PT Recommendations:  Skilled Nursing Facility Information / Referral to community resources:     Patient/Family's Response to care:  Patient and son hopeful that [facility] has a bed for patient rehab. 2nd choice [facility] and third choice is [facility]  Patient/Family's Understanding of and Emotional Response to Diagnosis, Current Treatment, and Prognosis:  Patient and son realize that patient has become weaker with new fractures and requires 24 hour care.  Patient's son understands that patient may need long term care after rehab.    Emotional Assessment Appearance:  Appears stated age Attitude/Demeanor/Rapport:   Lethargic Affect (typically observed):  Apprehensive Orientation:  Oriented to Self, Oriented to Place, Oriented to  Time, Oriented to Situation Alcohol / Substance use:    Psych involvement (Current and /or in the community):  No (Comment)  Discharge Needs  Concerns to be addressed:    Readmission within the last 30 days:    Current discharge risk:    Barriers to Discharge:  No Barriers Identified    G , LCSW 03/13/2017, 4:31 PM  

## 2017-03-13 NOTE — Clinical Social Work Note (Signed)
LCSW faxed paperwork to [employer] for rehab.  Dede Query, LCSW [facility] Worker - Weekend Coverage cell #: [phone]

## 2017-03-13 NOTE — Progress Notes (Signed)
[place] for Warfarin Indication: Hx of Pulmonary Embolism  Allergies  Allergen Reactions  . Ambien [Zolpidem Tartrate] Nausea And Vomiting  . Codeine Phosphate Nausea And Vomiting  . Darvocet [Propoxyphene N-Acetaminophen] Nausea And Vomiting  . Promethazine Hcl Nausea And Vomiting  . Sulfamethoxazole Other (See Comments)    Pt doesn't remember reaction  . Vicodin [Hydrocodone-Acetaminophen] Other (See Comments)    Unknown reaction  . Amoxicillin Nausea And Vomiting and Rash  . Penicillins Nausea And Vomiting and Rash    Patient Measurements: Height: 5\' 1"  (154.9 cm) Weight: 146 lb 11.2 oz (66.5 kg) IBW/kg (Calculated) : 47.8   Vital Signs: Temp: 97.9 F (36.6 C) (05/13 0522) Temp Source: Oral (05/13 0522) BP: 156/61 (05/13 0939) Pulse Rate: 65 (05/13 0522)  Labs:  Recent Labs  03/11/17 2031 03/12/17 0437 03/13/17 0255  HGB 12.0 10.8* 12.7  HCT 36.6 33.0* 38.6  PLT 276 264 299  LABPROT  --  29.3* 28.7*  INR  --  2.71 2.64  CREATININE 0.96  --  0.99  TROPONINI  --   --  0.03*    Estimated Creatinine Clearance: 31 mL/min (by C-G formula based on SCr of 0.99 mg/dL).   Medications:  Scheduled:  . acetaminophen  500 mg Oral TID  . amLODipine  10 mg Oral q morning - 10a  . aspirin EC  81 mg Oral Daily  . atorvastatin  10 mg Oral QPM  . bisacodyl  5 mg Oral Daily  . donepezil  10 mg Oral q morning - 10a  . doxepin  10 mg Oral QHS  . hydrALAZINE  25 mg Oral Q8H  . LORazepam  0.5 mg Oral BID  . losartan  50 mg Oral QHS  . oxybutynin  5 mg Oral BID  . pantoprazole  40 mg Oral Daily  . polyethylene glycol  17 g Oral BID  . potassium chloride SA  20 mEq Oral Q lunch  . predniSONE  1 mg Oral Q breakfast  . rOPINIRole  1 mg Oral 3 times per day  . sertraline  25 mg Oral Daily  . Warfarin - Pharmacist Dosing Inpatient   Does not apply q1800   Infusions:  . cefTRIAXone (ROCEPHIN)  IV Stopped (03/12/17 2242)   PRN:  acetaminophen **OR** acetaminophen, fentaNYL (SUBLIMAZE) injection, ibuprofen, ondansetron **OR** ondansetron (ZOFRAN) IV  Assessment: 83 y/o F on chronic warfarin anticoagulation for hx of massive pulmonary embolism, admitted with intractable back pain and decreased mobility after a fall.  According to medication history patient's usual warfarin regimen is 2.5 mg every evening except 3.75 mg on Wednesdays, and last dose was taken on 5/10.   INR therapeutic on admission. On low dose ASA (as PTA) for CAD.  Today, 03/13/2017: INR therapeutic, stable. No reports of bleeding. Concomitant ceftriaxone noted - broad-spectrum antibiotic may increase INR response to warfarin. Concomitant ibuprofen noted. Heart healthy diet - charted as eating 75 - 100% of trays.   Goal of Therapy:  INR 2-3    Plan:  Warfarin 2.5 mg PO x 1 this PM as per patient's usual regimen. INR daily while inpatient. Follow clinical course, monitor for any reports of bleeding  Clayburn Pert, PharmD, BCPS Pager: [phone] 03/13/2017  10:01 AM

## 2017-03-13 NOTE — Progress Notes (Signed)
Had one large loose BM.

## 2017-03-14 DIAGNOSIS — D689 Coagulation defect, unspecified: Secondary | ICD-10-CM

## 2017-03-14 DIAGNOSIS — N179 Acute kidney failure, unspecified: Secondary | ICD-10-CM

## 2017-03-14 LAB — BASIC METABOLIC PANEL
ANION GAP: 10 (ref 5–15)
BUN: 16 mg/dL (ref 4–21)
BUN: 16 mg/dL (ref 6–20)
CHLORIDE: 107 mmol/L (ref 101–111)
CO2: 20 mmol/L — ABNORMAL LOW (ref 22–32)
Calcium: 8.3 mg/dL — ABNORMAL LOW (ref 8.9–10.3)
Creatinine, Ser: 1.22 mg/dL — ABNORMAL HIGH (ref 0.44–1.00)
Creatinine: 1.2 mg/dL — AB (ref 0.5–1.1)
GFR calc Af Amer: 43 mL/min — ABNORMAL LOW (ref >60)
GFR calc non Af Amer: 37 mL/min — ABNORMAL LOW (ref >60)
GLUCOSE: 98 mg/dL (ref 65–99)
Glucose: 98 mg/dL
POTASSIUM: 4.1 mmol/L (ref 3.5–5.1)
Potassium: 4.1 mmol/L (ref 3.4–5.3)
Sodium: 137 mmol/L (ref 135–145)
Sodium: 137 mmol/L (ref 137–147)

## 2017-03-14 LAB — URINE CULTURE: Culture: >=100000 — AB

## 2017-03-14 LAB — PROTIME-INR
INR: 4.31
Prothrombin Time: 42.4 seconds — ABNORMAL HIGH (ref 11.4–15.2)

## 2017-03-14 MED ORDER — SODIUM CHLORIDE 0.9 % IV SOLN
INTRAVENOUS | Status: DC
  Administered 2017-03-14 – 2017-03-15 (×2): via INTRAVENOUS

## 2017-03-14 NOTE — Progress Notes (Signed)
Physical Therapy Treatment Patient Details Name: [name] [name] MRN: [id number] DOB: 01-24-1921 Today's Date: 03/14/2017    History of Present Illness [name] is a 83 y.o. woman with a history of VTE (anticoagulated with warfarin), Polymyalgia rheumatica, restless leg syndrome, HTN, GERD, CAD S/P multiple stents, and breast cancer who also has a known history of prior compression fractures.  She had an accidental fall on Monday . CT shows L2, T9, T12 compression fxz, indeterminant age.    PT Comments    Assisted OOB to [facility] then amb a limited distance.  Positioned in recliner.   Follow Up Recommendations  SNF     Equipment Recommendations       Recommendations for Other Services       Precautions / Restrictions Precautions Precautions: Back Precaution Comments: Pt requires max cues for precautions  Restrictions Weight Bearing Restrictions: No    Mobility  Bed Mobility Overal bed mobility: Needs Assistance Bed Mobility: Rolling;Sidelying to Sit;Sit to Sidelying Rolling: Mod assist Sidelying to sit: Mod assist     Sit to sidelying: Mod assist General bed mobility comments: cues to log roll and assist for LEs and trunk   Transfers     Transfers: Sit to/from [employer] Transfers Sit to Stand: Min assist Stand pivot transfers: Min assist       General transfer comment: cues for hand placement and safety   Ambulation/Gait Ambulation/Gait assistance: Min assist Ambulation Distance (Feet): 8 Feet Assistive device: Rolling walker (2 wheeled) Gait Pattern/deviations: Step-through pattern;Trunk flexed Gait velocity: decreased   General Gait Details: cues for sequence   Stairs            Wheelchair Mobility    Modified Rankin (Stroke Patients Only)       Balance                                            Cognition Arousal/Alertness: Awake/alert Behavior During Therapy: WFL for tasks assessed/performed Overall  Cognitive Status: Within Functional Limits for tasks assessed                                        Exercises      General Comments        Pertinent Vitals/Pain Pain Assessment: Faces Faces Pain Scale: Hurts little more Pain Location: back with activity Pain Descriptors / Indicators: Aching;Grimacing Pain Intervention(s): Monitored during session;Repositioned    Home Living                      Prior Function            PT Goals (current goals can now be found in the care plan section) Progress towards PT goals: Progressing toward goals    Frequency    Min 3X/week      PT Plan Current plan remains appropriate    Co-evaluation              AM-PAC PT "6 Clicks" Daily Activity  Outcome Measure  Difficulty turning over in bed (including adjusting bedclothes, sheets and blankets)?: A Little Difficulty moving from lying on back to sitting on the side of the bed? : A Little Difficulty sitting down on and standing up from a chair with arms (e.g., wheelchair, bedside commode, etc,.)?:  A Little Help needed moving to and from a bed to chair (including a wheelchair)?: A Little Help needed walking in hospital room?: A Little Help needed climbing 3-5 steps with a railing? : A Lot 6 Click Score: 17    End of Session Equipment Utilized During Treatment: Gait belt Activity Tolerance: Patient tolerated treatment well Patient left: in chair;with call bell/phone within reach;with chair alarm set Nurse Communication: Mobility status PT Visit Diagnosis: History of falling (Z91.81);Difficulty in walking, not elsewhere classified (R26.2)     Time: 0349-6116 PT Time Calculation (min) (ACUTE ONLY): 18 min  Charges:  $Gait Training: 8-22 mins                    G Codes:       Rica Koyanagi  PTA [facility]  Acute  Rehab Pager      [phone]

## 2017-03-14 NOTE — Progress Notes (Signed)
CSW following for disposition/ DC planning.  Met with pt at bedside to provide SNF bed offers. Pt chooses [facility]. [place] [facility] authorized SNF [phone], next review date 03/17/17, per Apolonio Schneiders). CSW accepted [facility] in the [place] and spoke with admissions Lexine Baton).  Will continue following to assist.  Sharren Bridge, MSW, LCSW Clinical Social Work 03/14/2017 [phone]

## 2017-03-14 NOTE — Progress Notes (Addendum)
[place] for Warfarin Indication: Hx of Pulmonary Embolism  Allergies  Allergen Reactions  . Ambien [Zolpidem Tartrate] Nausea And Vomiting  . Codeine Phosphate Nausea And Vomiting  . Darvocet [Propoxyphene N-Acetaminophen] Nausea And Vomiting  . Promethazine Hcl Nausea And Vomiting  . Sulfamethoxazole Other (See Comments)    Pt doesn't remember reaction  . Vicodin [Hydrocodone-Acetaminophen] Other (See Comments)    Unknown reaction  . Amoxicillin Nausea And Vomiting and Rash  . Penicillins Nausea And Vomiting and Rash    Patient Measurements: Height: 5\' 1"  (154.9 cm) Weight: 146 lb 11.2 oz (66.5 kg) IBW/kg (Calculated) : 47.8   Vital Signs: Temp: 98.6 F (37 C) (05/14 0600) Temp Source: Oral (05/14 0600) BP: 138/51 (05/14 0600) Pulse Rate: 52 (05/14 0600)  Labs:  Recent Labs  03/11/17 2031 03/12/17 0437 03/13/17 0255 03/14/17 0351  HGB 12.0 10.8* 12.7  --   HCT 36.6 33.0* 38.6  --   PLT 276 264 299  --   LABPROT  --  29.3* 28.7* 42.4*  INR  --  2.71 2.64 4.31*  CREATININE 0.96  --  0.99 1.22*  TROPONINI  --   --  0.03*  --     Estimated Creatinine Clearance: 25.2 mL/min (A) (by C-G formula based on SCr of 1.22 mg/dL (H)).   Medications:  Scheduled:  . acetaminophen  500 mg Oral TID  . amLODipine  10 mg Oral q morning - 10a  . aspirin EC  81 mg Oral Daily  . atorvastatin  10 mg Oral QPM  . bisacodyl  5 mg Oral Daily  . donepezil  10 mg Oral q morning - 10a  . doxepin  10 mg Oral QHS  . hydrALAZINE  25 mg Oral Q8H  . LORazepam  0.5 mg Oral BID  . losartan  50 mg Oral QHS  . oxybutynin  5 mg Oral BID  . pantoprazole  40 mg Oral Daily  . polyethylene glycol  17 g Oral BID  . potassium chloride SA  20 mEq Oral Q lunch  . predniSONE  1 mg Oral Q breakfast  . rOPINIRole  1 mg Oral 3 times per day  . sertraline  25 mg Oral Daily  . Warfarin - Pharmacist Dosing Inpatient   Does not apply q1800   Infusions:  .  cefTRIAXone (ROCEPHIN)  IV Stopped (03/13/17 2348)   PRN: fentaNYL (SUBLIMAZE) injection, ibuprofen, ondansetron **OR** ondansetron (ZOFRAN) IV  Assessment: 83 y/o F on chronic warfarin anticoagulation for hx of massive pulmonary embolism, admitted with intractable back pain and decreased mobility after a fall.  According to medication history patient's usual warfarin regimen is 2.5 mg every evening except 3.75 mg on Wednesdays, and last dose was taken on 5/10.   INR therapeutic on admission. On low dose ASA (as PTA) for CAD.  Today, 03/14/2017: INR supratherapeutic today. No bleeding per RN report. Concomitant ceftriaxone noted - broad-spectrum antibiotic may increase INR response to warfarin. Concomitant PRN ibuprofen noted - not using. Heart healthy diet    Goal of Therapy:  INR 2-3    Plan:  No warfarin today.  INR daily while inpatient. Follow clinical course, monitor for any reports of bleeding  Clayburn Pert, PharmD, BCPS Pager: [phone] 03/14/2017  8:46 AM

## 2017-03-14 NOTE — Progress Notes (Addendum)
Nursing Note: Received critical value INR-4.31.A; Paged on-call,then called pharmacy and spoke w/ Rachell Cipro and he says that coumadin will be held today.Spoke w/ Kern Reap and he was made aware that pharmacy has been notified.No signs of bleeding noted.wbb

## 2017-03-14 NOTE — Significant Event (Signed)
CRITICAL VALUE ALERT  Critical value received:  INR-4.31  Date of notification:  03/14/17  Time of notification:  0655  Critical value read back:yes  Nurse who received alert:  Dorothyann Peng Keigan Girten,rn  MD notified (1st page):  Hal Hope Time of first page:  [phone]  MD notified (2nd page):  Time of second page:  Responding MD:  Hal Hope  Time MD responded:  [phone]

## 2017-03-14 NOTE — Progress Notes (Signed)
PROGRESS NOTE    [name] [name]  WIO:[id number] DOB: 1920-11-17 DOA: 03/11/2017 PCP: Deland Pretty, MD     Brief Narrative:  83 yo female presented with the chief complain of intractable back pain and decreased mobility. 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 Had mechanical fall 6 days prior to admission, then developed severe pain in her mid-back. Worsening symptoms despite po analgesics to the point where patient became not ambulatory and unable to do her activities of daily living including feeding herself. On the initial physical examination patient hemodynamically stable, moist mucous membranes, lungs clear to ascultation, rhythmic S1 and S2, abdomen soft with no lower extremity edema or deformity. Spine CT with new subacute L2 superior endplate compression fracture and chronic compression deformities at T9, T12 and L3. Patient admitted with the working diagnosis of acute ambulatory dysfunction due L2 subacute compression fracture complicated with urine infection.    Assessment & Plan:   Principal Problem:   Intractable back pain Active Problems:   CAD (coronary artery disease)   VTE (venous thromboembolism)   Chronic anticoagulation   Compression fracture of L2 (HCC)   Acute lower UTI   Accelerated hypertension    1. L2 subacute compression fracture. Acetaminophen and tramadol, will hold ibuprofen due to elevated serum cr. As needed fentanyl for severe pain, will plan to discharge to snf.  2. Urine infection. Antibiotic therapy with ceftriaxone #2. Will complete 7 days of therapy.   3. Polymyalgia rheumatica. On very low dose prednisone. No signs of exacerbation.    4. HTN. Continue Amlodipine 10 mg and losartan 50 mg plus hydralazine 25 mg po tid, blood pressure systolic 426 to 834, no further chest pain or dyspnea.   5. History of massive pulmonary embolism.  Massive PE in 2011. Long term course of anticoagulation with  warfarin. Noted elevation of INR at 4. With new coagulopathy. Will hold on warfarin and follow on INR in am, patient is a high fall risk.  6. Dementia. Continue sertraline, lorazepam and donepezil. No agitation.  7. AKI. New aki, suspected to be pre-renal, will place patient on IV fluids and will follow on renal panel in am, dc ibuprofen and avoid hypotension. K at 4,1 with serum bicarbonate at 20.   DVT prophylaxis:warfarin  Code Status:Full  Family Communication:No family at the bedside  Disposition Plan:Home    Consultants:    Procedures:    Antimicrobials:   ceftriaxone    Subjective: Back pain has improved, no nausea or vomiting, no chest pain or dyspnea. Has difficulty ambulating, planned for SNF at discharge.   Objective: Vitals:   03/13/17 1336 03/13/17 2025 03/13/17 2129 03/14/17 0600  BP: (!) 149/58 140/60 (!) 150/60 (!) 138/51  Pulse:  63 76 (!) 52  Resp:  18  18  Temp:  97.9 F (36.6 C)  98.6 F (37 C)  TempSrc:  Oral  Oral  SpO2:  97%  96%  Weight:      Height:        Intake/Output Summary (Last 24 hours) at 03/14/17 1305 Last data filed at 03/14/17 1040  Gross per 24 hour  Intake              250 ml  Output                1 ml  Net              249 ml   [employer]  03/11/17 1813 03/12/17 0123  Weight: 62.1 kg (137 lb) 66.5 kg (146 lb 11.2 oz)    Examination:  General exam: Not in pain or dyspnea E ENT: no pallor or icterus, oral mucosa moist.  Respiratory system: Clear to auscultation. Respiratory effort normal. No wheezing, no rhonchi or rales.  Cardiovascular system: S1 & S2 heard, RRR. No JVD, murmurs, rubs, gallops or clicks. No pedal edema. Gastrointestinal system: Abdomen is nondistended, soft and nontender. No organomegaly or masses felt. Normal bowel sounds heard. Central nervous system: Alert and oriented. No focal neurological deficits. Extremities: Symmetric 5 x 5 power. Skin: No rashes, lesions or ulcers .      Data Reviewed: I have personally reviewed following labs and imaging studies  CBC:  Recent Labs Lab 03/11/17 2031 03/12/17 0437 03/13/17 0255  WBC 11.2* 9.5 10.1  NEUTROABS 8.2*  --  6.5  HGB 12.0 10.8* 12.7  HCT 36.6 33.0* 38.6  MCV 84.7 84.4 84.5  PLT 276 264 458   Basic Metabolic Panel:  Recent Labs Lab 03/11/17 2031 03/13/17 0255 03/14/17 0351  NA 137 140 137  K 3.5 3.5 4.1  CL 105 109 107  CO2 22 21* 20*  GLUCOSE 104* 118* 98  BUN 13 14 16   CREATININE 0.96 0.99 1.22*  CALCIUM 8.8* 9.0 8.3*   GFR: Estimated Creatinine Clearance: 25.2 mL/min (A) (by C-G formula based on SCr of 1.22 mg/dL (H)). Liver Function Tests:  Recent Labs Lab 03/11/17 2031  AST 17  ALT 14  ALKPHOS 60  BILITOT 0.5  PROT 6.7  ALBUMIN 3.4*   No results for input(s): LIPASE, AMYLASE in the last 168 hours. No results for input(s): AMMONIA in the last 168 hours. Coagulation Profile:  Recent Labs Lab 03/12/17 0437 03/13/17 0255 03/14/17 0351  INR 2.71 2.64 4.31*   Cardiac Enzymes:  Recent Labs Lab 03/13/17 0255  TROPONINI 0.03*   BNP (last 3 results) No results for input(s): PROBNP in the last 8760 hours. HbA1C: No results for input(s): HGBA1C in the last 72 hours. CBG: No results for input(s): GLUCAP in the last 168 hours. Lipid Profile: No results for input(s): CHOL, HDL, LDLCALC, TRIG, CHOLHDL, LDLDIRECT in the last 72 hours. Thyroid Function Tests: No results for input(s): TSH, T4TOTAL, FREET4, T3FREE, THYROIDAB in the last 72 hours. Anemia Panel: No results for input(s): VITAMINB12, FOLATE, FERRITIN, TIBC, IRON, RETICCTPCT in the last 72 hours. Sepsis Labs: No results for input(s): PROCALCITON, LATICACIDVEN in the last 168 hours.  Recent Results (from the past 240 hour(s))  Culture, Urine     Status: Abnormal   Collection Time: 03/11/17  9:30 PM  Result Value Ref Range Status   Specimen Description URINE, CLEAN CATCH  Final   Special Requests NONE   Final   Culture >=100,000 COLONIES/mL CITROBACTER BRAAKII (A)  Final   Report Status 03/14/2017 FINAL  Final   Organism ID, Bacteria CITROBACTER BRAAKII (A)  Final      Susceptibility   Citrobacter braakii - MIC*    CEFAZOLIN >=64 RESISTANT Resistant     CEFTRIAXONE <=1 SENSITIVE Sensitive     CIPROFLOXACIN <=0.25 SENSITIVE Sensitive     GENTAMICIN <=1 SENSITIVE Sensitive     IMIPENEM 1 SENSITIVE Sensitive     NITROFURANTOIN <=16 SENSITIVE Sensitive     TRIMETH/SULFA <=20 SENSITIVE Sensitive     PIP/TAZO <=4 SENSITIVE Sensitive     * >=100,000 COLONIES/mL CITROBACTER BRAAKII         Radiology Studies: No results found.  Scheduled Meds: . acetaminophen  500 mg Oral TID  . amLODipine  10 mg Oral q morning - 10a  . aspirin EC  81 mg Oral Daily  . atorvastatin  10 mg Oral QPM  . bisacodyl  5 mg Oral Daily  . donepezil  10 mg Oral q morning - 10a  . doxepin  10 mg Oral QHS  . hydrALAZINE  25 mg Oral Q8H  . LORazepam  0.5 mg Oral BID  . losartan  50 mg Oral QHS  . oxybutynin  5 mg Oral BID  . pantoprazole  40 mg Oral Daily  . polyethylene glycol  17 g Oral BID  . potassium chloride SA  20 mEq Oral Q lunch  . predniSONE  1 mg Oral Q breakfast  . rOPINIRole  1 mg Oral 3 times per day  . sertraline  25 mg Oral Daily   Continuous Infusions: . sodium chloride    . cefTRIAXone (ROCEPHIN)  IV Stopped (03/13/17 2348)     LOS: 1 day       Nahima Ales Gerome Apley, MD [facility] Pager [phone]  If 7PM-7AM, please contact night-coverage [email or web address] Password [email or web address] 03/14/2017, 1:05 PM

## 2017-03-15 LAB — CBC WITH DIFFERENTIAL/PLATELET
BASOS ABS: 0.1 10*3/uL (ref 0.0–0.1)
BASOS PCT: 1 %
EOS ABS: 0.8 10*3/uL — AB (ref 0.0–0.7)
EOS PCT: 10 %
HCT: 32.1 % — ABNORMAL LOW (ref 36.0–46.0)
Hemoglobin: 10.3 g/dL — ABNORMAL LOW (ref 12.0–15.0)
Lymphocytes Relative: 25 %
Lymphs Abs: 1.9 10*3/uL (ref 0.7–4.0)
MCH: 27.4 pg (ref 26.0–34.0)
MCHC: 32.1 g/dL (ref 30.0–36.0)
MCV: 85.4 fL (ref 78.0–100.0)
MONO ABS: 0.7 10*3/uL (ref 0.1–1.0)
Monocytes Relative: 9 %
Neutro Abs: 4.2 10*3/uL (ref 1.7–7.7)
Neutrophils Relative %: 55 %
PLATELETS: 290 10*3/uL (ref 150–400)
RBC: 3.76 MIL/uL — AB (ref 3.87–5.11)
RDW: 15.1 % (ref 11.5–15.5)
WBC: 7.7 10*3/uL (ref 4.0–10.5)

## 2017-03-15 LAB — BASIC METABOLIC PANEL
ANION GAP: 8 (ref 5–15)
BUN: 18 mg/dL (ref 4–21)
BUN: 18 mg/dL (ref 6–20)
CALCIUM: 8.4 mg/dL — AB (ref 8.9–10.3)
CO2: 19 mmol/L — ABNORMAL LOW (ref 22–32)
CREATININE: 1.2 mg/dL — AB (ref 0.5–1.1)
Chloride: 110 mmol/L (ref 101–111)
Creatinine, Ser: 1.15 mg/dL — ABNORMAL HIGH (ref 0.44–1.00)
GFR calc Af Amer: 46 mL/min — ABNORMAL LOW (ref >60)
GFR calc non Af Amer: 40 mL/min — ABNORMAL LOW (ref >60)
GLUCOSE: 100 mg/dL — AB (ref 65–99)
Glucose: 100 mg/dL
POTASSIUM: 4.1 mmol/L (ref 3.4–5.3)
Potassium: 4.1 mmol/L (ref 3.5–5.1)
SODIUM: 137 mmol/L (ref 137–147)
SODIUM: 137 mmol/L (ref 135–145)

## 2017-03-15 LAB — PROTIME-INR
INR: 4.69 — AB
PROTHROMBIN TIME: 45.4 s — AB (ref 11.4–15.2)

## 2017-03-15 LAB — CBC AND DIFFERENTIAL
HCT: 32 % — AB (ref 36–46)
HEMOGLOBIN: 10.3 g/dL — AB (ref 12.0–16.0)
Platelets: 29 10*3/uL — AB (ref 150–399)
WBC: 7.7 10*3/mL

## 2017-03-15 NOTE — Progress Notes (Signed)
Physical Therapy Treatment Patient Details Name: [name] [name] MRN: [id number] DOB: [birth year] Today's Date: 03/15/2017    History of Present Illness [name] [name] is a 83 y.o. woman with a history of VTE (anticoagulated with warfarin), Polymyalgia rheumatica, restless leg syndrome, HTN, GERD, CAD S/P multiple stents, and breast cancer who also has a known history of prior compression fractures.  She had an accidental fall on Monday . CT shows L2, T9, T12 compression fxz, indeterminant age.    PT Comments    Assisted OOB to [facility] then amb a limited distance. Pt will need ST Rehab at SNF due to weakness, gait instability and FALL RISK.  Follow Up Recommendations  SNF     Equipment Recommendations       Recommendations for Other Services       Precautions / Restrictions Precautions Precautions: Back Restrictions Weight Bearing Restrictions: No    Mobility  Bed Mobility Overal bed mobility: Needs Assistance Bed Mobility: Rolling;Sidelying to Sit;Sit to Sidelying Rolling: Mod assist Sidelying to sit: Mod assist       General bed mobility comments: cues to log roll and assist for LEs and trunk   Transfers Overall transfer level: Needs assistance Equipment used: Rolling walker (2 wheeled) Transfers: Sit to/from [employer] Transfers Sit to Stand: Min assist;Mod assist Stand pivot transfers: Min assist;Mod assist       General transfer comment: cues for hand placement and safety   assisted OOB to [facility]  Ambulation/Gait Ambulation/Gait assistance: Min assist;Mod assist Ambulation Distance (Feet): 12 Feet Assistive device: Rolling walker (2 wheeled) Gait Pattern/deviations: Step-through pattern;Trunk flexed Gait velocity: decreased   General Gait Details: unsteady gait with limited distance due to back pain.  Recliner following for safety.    Stairs            Wheelchair Mobility    Modified Rankin (Stroke Patients Only)       Balance                                            Cognition Arousal/Alertness: Awake/alert Behavior During Therapy: WFL for tasks assessed/performed Overall Cognitive Status: Within Functional Limits for tasks assessed                                        Exercises      General Comments        Pertinent Vitals/Pain Pain Assessment: 0-10 Pain Score: 5  Pain Location: back with activity Pain Descriptors / Indicators: Discomfort Pain Intervention(s): Monitored during session;Repositioned    Home Living                      Prior Function            PT Goals (current goals can now be found in the care plan section) Progress towards PT goals: Progressing toward goals    Frequency    Min 3X/week      PT Plan Current plan remains appropriate    Co-evaluation              AM-PAC PT "6 Clicks" Daily Activity  Outcome Measure  Difficulty turning over in bed (including adjusting bedclothes, sheets and blankets)?: A Little Difficulty moving from lying on back to sitting on the side of  the bed? : A Little Difficulty sitting down on and standing up from a chair with arms (e.g., wheelchair, bedside commode, etc,.)?: A Little Help needed moving to and from a bed to chair (including a wheelchair)?: A Little Help needed walking in hospital room?: A Little Help needed climbing 3-5 steps with a railing? : A Lot 6 Click Score: 17    End of Session Equipment Utilized During Treatment: Gait belt Activity Tolerance: Patient limited by pain Patient left: in chair;with call bell/phone within reach;with chair alarm set Nurse Communication: Mobility status PT Visit Diagnosis: History of falling (Z91.81);Difficulty in walking, not elsewhere classified (R26.2)     Time: 9022-8406 PT Time Calculation (min) (ACUTE ONLY): 26 min  Charges:  $Gait Training: 8-22 mins $Therapeutic Activity: 8-22 mins                    G Codes:       Rica Koyanagi  PTA [facility]  Acute  Rehab Pager      [phone]

## 2017-03-15 NOTE — Progress Notes (Signed)
OT Cancellation Note  Patient Details Name: DESHAWN SKELLEY MRN: [id number] DOB: 10-02-1921   Cancelled Treatment:    Reason Eval/Treat Not Completed: Other (comment); Pt reports having recently gotten up to use BSC, reporting increased fatigue and SOB. Will check back tomorrow.  Lou Cal, OT Pager [phone] 03/15/2017   Raymondo Band 03/15/2017, 4:27 PM

## 2017-03-15 NOTE — Progress Notes (Signed)
PROGRESS NOTE    [name] [name]  EVO:[id number] DOB: [birth year] DOA: 03/11/2017 PCP: Deland Pretty, MD    Brief Narrative:  83 yo female presented with the chief complain of intractable back pain and decreased mobility. 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 Had mechanical fall 6 days prior to admission, then developed severe pain in her mid-back. Worsening symptoms despite po analgesics to the point where patient became not ambulatory and unable to do her activities of daily living including feeding herself. On the initial physical examination patient hemodynamically stable, moist mucous membranes, lungs clear to ascultation, rhythmic S1 and S2, abdomen soft with no lower extremity edema or deformity. Spine CT with new subacute L2 superior endplate compression fracture and chronic compression deformities at T9, T12 and L3. Patient admitted with the working diagnosis of acute ambulatory dysfunction due L2 subacute compression fracture complicated with urine infection. Responding well to antibiotic therapy. Developed worsening coagulopathy with rising supra-therapeutic INR. Plan to discharge once INR stable.     Assessment & Plan:   Principal Problem:   Intractable back pain Active Problems:   CAD (coronary artery disease)   VTE (venous thromboembolism)   Chronic anticoagulation   Compression fracture of L2 (HCC)   Acute lower UTI   Accelerated hypertension    1. L2 subacute compression fracture. Will continue on acetaminophen and tramadol for pain control. As needed fentanyl for severe pain, will plan to discharge to snf, once INR more stable.   2. Urine infection. Antibiotic therapy with ceftriaxone #3. Will complete 7 days of therapy.   3. Polymyalgia rheumatica.  Continue very low dose prednisone. No signs of exacerbation. May need to reconsider this therapy in the setting of compression fractures.    4. HTN.On Amlodipine 10 mg,  losartan 50 mg and hydralazine 25 mg po tid, blood pressure systolic 299 to 371, tolerating well.  5. History of massive pulmonary embolism. Massive PE in 2011. On long term course of anticoagulation with warfarin. Noted worsening INR elevation at 4.6. Continue to hold on warfarin and follow on INR in am, will defer transfer to SNF due to high fall risk and potential bleeding  6. Dementia. Continue sertraline, lorazepam and donepezil. With good toleration.   7. AKI. Renal function with cr down to 1,15 from 1,2, will follow on renal panel in am, will continue IV fluids for now. Avoid hypotension or nephrotoxic medications.   DVT prophylaxis:warfarin  Code Status:Full  Family Communication:No family at the bedside  Disposition Plan:Home    Consultants:    Procedures:    Antimicrobials:   ceftriaxone     Subjective: Feeling better, back pain controlled, no nausea or vomiting. No chest pain or dyspnea.   Objective: Vitals:   03/14/17 0600 03/14/17 1505 03/14/17 2024 03/15/17 0458  BP: (!) 138/51 134/69 (!) 164/53 (!) 158/57  Pulse: (!) 52 (!) 59 66 82  Resp: 18 17  18   Temp: 98.6 F (37 C) 97.8 F (36.6 C) 97.8 F (36.6 C) 98.6 F (37 C)  TempSrc: Oral Oral Oral Oral  SpO2: 96% 95% 98% 97%  Weight:      Height:        Intake/Output Summary (Last 24 hours) at 03/15/17 1000 Last data filed at 03/15/17 0500  Gross per 24 hour  Intake             1415 ml  Output  0 ml  Net             1415 ml   Filed Weights   03/11/17 1813 03/12/17 0123  Weight: 62.1 kg (137 lb) 66.5 kg (146 lb 11.2 oz)    Examination:  General exam: deconditioned E ENT. Mild pallor, no icterus, oral mucosa moist.  Respiratory system: Clear to auscultation. Respiratory effort normal. No wheezing, rales or rhonchi.  Cardiovascular system: S1 & S2 heard, RRR. No JVD, murmurs, rubs, gallops or clicks. Non pitting pedal edema. Gastrointestinal system: Abdomen is  nondistended, soft and nontender. No organomegaly or masses felt. Normal bowel sounds heard. Central nervous system: Alert and oriented. No focal neurological deficits. Extremities: Symmetric 5 x 5 power. Skin: No rashes, lesions or ulcers     Data Reviewed: I have personally reviewed following labs and imaging studies  CBC:  Recent Labs Lab 03/11/17 2031 03/12/17 0437 03/13/17 0255 03/15/17 0402  WBC 11.2* 9.5 10.1 7.7  NEUTROABS 8.2*  --  6.5 4.2  HGB 12.0 10.8* 12.7 10.3*  HCT 36.6 33.0* 38.6 32.1*  MCV 84.7 84.4 84.5 85.4  PLT 276 264 299 322   Basic Metabolic Panel:  Recent Labs Lab 03/11/17 2031 03/13/17 0255 03/14/17 0351 03/15/17 0402  NA 137 140 137 137  K 3.5 3.5 4.1 4.1  CL 105 109 107 110  CO2 22 21* 20* 19*  GLUCOSE 104* 118* 98 100*  BUN 13 14 16 18   CREATININE 0.96 0.99 1.22* 1.15*  CALCIUM 8.8* 9.0 8.3* 8.4*   GFR: Estimated Creatinine Clearance: 26.7 mL/min (A) (by C-G formula based on SCr of 1.15 mg/dL (H)). Liver Function Tests:  Recent Labs Lab 03/11/17 2031  AST 17  ALT 14  ALKPHOS 60  BILITOT 0.5  PROT 6.7  ALBUMIN 3.4*   No results for input(s): LIPASE, AMYLASE in the last 168 hours. No results for input(s): AMMONIA in the last 168 hours. Coagulation Profile:  Recent Labs Lab 03/12/17 0437 03/13/17 0255 03/14/17 0351 03/15/17 0402  INR 2.71 2.64 4.31* 4.69*   Cardiac Enzymes:  Recent Labs Lab 03/13/17 0255  TROPONINI 0.03*   BNP (last 3 results) No results for input(s): PROBNP in the last 8760 hours. HbA1C: No results for input(s): HGBA1C in the last 72 hours. CBG: No results for input(s): GLUCAP in the last 168 hours. Lipid Profile: No results for input(s): CHOL, HDL, LDLCALC, TRIG, CHOLHDL, LDLDIRECT in the last 72 hours. Thyroid Function Tests: No results for input(s): TSH, T4TOTAL, FREET4, T3FREE, THYROIDAB in the last 72 hours. Anemia Panel: No results for input(s): VITAMINB12, FOLATE, FERRITIN, TIBC,  IRON, RETICCTPCT in the last 72 hours. Sepsis Labs: No results for input(s): PROCALCITON, LATICACIDVEN in the last 168 hours.  Recent Results (from the past 240 hour(s))  Culture, Urine     Status: Abnormal   Collection Time: 03/11/17  9:30 PM  Result Value Ref Range Status   Specimen Description URINE, CLEAN CATCH  Final   Special Requests NONE  Final   Culture >=100,000 COLONIES/mL CITROBACTER BRAAKII (A)  Final   Report Status 03/14/2017 FINAL  Final   Organism ID, Bacteria CITROBACTER BRAAKII (A)  Final      Susceptibility   Citrobacter braakii - MIC*    CEFAZOLIN >=64 RESISTANT Resistant     CEFTRIAXONE <=1 SENSITIVE Sensitive     CIPROFLOXACIN <=0.25 SENSITIVE Sensitive     GENTAMICIN <=1 SENSITIVE Sensitive     IMIPENEM 1 SENSITIVE Sensitive     NITROFURANTOIN <=16 SENSITIVE  Sensitive     TRIMETH/SULFA <=20 SENSITIVE Sensitive     PIP/TAZO <=4 SENSITIVE Sensitive     * >=100,000 COLONIES/mL CITROBACTER BRAAKII         Radiology Studies: No results found.      Scheduled Meds: . acetaminophen  500 mg Oral TID  . amLODipine  10 mg Oral q morning - 10a  . aspirin EC  81 mg Oral Daily  . atorvastatin  10 mg Oral QPM  . bisacodyl  5 mg Oral Daily  . donepezil  10 mg Oral q morning - 10a  . doxepin  10 mg Oral QHS  . hydrALAZINE  25 mg Oral Q8H  . LORazepam  0.5 mg Oral BID  . losartan  50 mg Oral QHS  . oxybutynin  5 mg Oral BID  . pantoprazole  40 mg Oral Daily  . polyethylene glycol  17 g Oral BID  . potassium chloride SA  20 mEq Oral Q lunch  . predniSONE  1 mg Oral Q breakfast  . rOPINIRole  1 mg Oral 3 times per day  . sertraline  25 mg Oral Daily   Continuous Infusions: . sodium chloride 75 mL/hr at 03/15/17 0528  . cefTRIAXone (ROCEPHIN)  IV Stopped (03/14/17 2326)     LOS: 2 days      Mauricio Gerome Apley, MD [facility] Pager [phone]  If 7PM-7AM, please contact night-coverage [email or web address] Password [email or web address] 03/15/2017, 10:00  AM

## 2017-03-15 NOTE — Progress Notes (Signed)
CRITICAL VALUE ALERT  Critical value received:  INR 4.69  Date of notification:  03-15-17  Time of notification:  0650  Critical value read back: yes  Nurse who received alert:  Henrietta Dine, RN  MD notified (1st page):  Baltazar Najjar, NP  Time of first page:  [phone]  MD notified (2nd page):  Time of second page:  Responding MD:   Time MD responded:

## 2017-03-15 NOTE — Progress Notes (Signed)
CSW following for disposition/ DC planning.  Spoke with pt's son [name] [phone] to inform him pt has chosen [facility]. Son agrees.   Spoke with [facility] (obtained authorization [id number] on 03/14/17 from [place]). Was told no updates needed at this time, pt's authorization still valid. Will follow up once DC date known. Updated [facility] admissions.   Will continue following to assist with DC.   Sharren Bridge, MSW, LCSW Clinical Social Work 03/15/2017 [phone]

## 2017-03-16 DIAGNOSIS — R791 Abnormal coagulation profile: Secondary | ICD-10-CM | POA: Diagnosis not present

## 2017-03-16 DIAGNOSIS — Z9181 History of falling: Secondary | ICD-10-CM | POA: Diagnosis not present

## 2017-03-16 DIAGNOSIS — A498 Other bacterial infections of unspecified site: Secondary | ICD-10-CM | POA: Diagnosis not present

## 2017-03-16 DIAGNOSIS — Z86711 Personal history of pulmonary embolism: Secondary | ICD-10-CM

## 2017-03-16 DIAGNOSIS — R488 Other symbolic dysfunctions: Secondary | ICD-10-CM | POA: Diagnosis not present

## 2017-03-16 DIAGNOSIS — N3000 Acute cystitis without hematuria: Secondary | ICD-10-CM | POA: Diagnosis not present

## 2017-03-16 DIAGNOSIS — N179 Acute kidney failure, unspecified: Secondary | ICD-10-CM | POA: Diagnosis not present

## 2017-03-16 DIAGNOSIS — B372 Candidiasis of skin and nail: Secondary | ICD-10-CM | POA: Diagnosis not present

## 2017-03-16 DIAGNOSIS — S32029D Unspecified fracture of second lumbar vertebra, subsequent encounter for fracture with routine healing: Secondary | ICD-10-CM | POA: Diagnosis not present

## 2017-03-16 DIAGNOSIS — G301 Alzheimer's disease with late onset: Secondary | ICD-10-CM | POA: Diagnosis not present

## 2017-03-16 DIAGNOSIS — M5489 Other dorsalgia: Secondary | ICD-10-CM | POA: Diagnosis not present

## 2017-03-16 DIAGNOSIS — F329 Major depressive disorder, single episode, unspecified: Secondary | ICD-10-CM | POA: Diagnosis not present

## 2017-03-16 DIAGNOSIS — F0390 Unspecified dementia without behavioral disturbance: Secondary | ICD-10-CM

## 2017-03-16 DIAGNOSIS — R262 Difficulty in walking, not elsewhere classified: Secondary | ICD-10-CM | POA: Diagnosis not present

## 2017-03-16 DIAGNOSIS — D649 Anemia, unspecified: Secondary | ICD-10-CM | POA: Diagnosis not present

## 2017-03-16 DIAGNOSIS — F0280 Dementia in other diseases classified elsewhere without behavioral disturbance: Secondary | ICD-10-CM | POA: Diagnosis not present

## 2017-03-16 DIAGNOSIS — M353 Polymyalgia rheumatica: Secondary | ICD-10-CM | POA: Diagnosis not present

## 2017-03-16 DIAGNOSIS — I10 Essential (primary) hypertension: Secondary | ICD-10-CM | POA: Diagnosis not present

## 2017-03-16 DIAGNOSIS — N390 Urinary tract infection, site not specified: Secondary | ICD-10-CM | POA: Diagnosis not present

## 2017-03-16 DIAGNOSIS — S32020D Wedge compression fracture of second lumbar vertebra, subsequent encounter for fracture with routine healing: Secondary | ICD-10-CM | POA: Diagnosis not present

## 2017-03-16 DIAGNOSIS — Z7901 Long term (current) use of anticoagulants: Secondary | ICD-10-CM | POA: Diagnosis not present

## 2017-03-16 DIAGNOSIS — M549 Dorsalgia, unspecified: Secondary | ICD-10-CM | POA: Diagnosis not present

## 2017-03-16 DIAGNOSIS — S32020A Wedge compression fracture of second lumbar vertebra, initial encounter for closed fracture: Secondary | ICD-10-CM | POA: Diagnosis not present

## 2017-03-16 DIAGNOSIS — S3992XA Unspecified injury of lower back, initial encounter: Secondary | ICD-10-CM | POA: Diagnosis not present

## 2017-03-16 DIAGNOSIS — M6281 Muscle weakness (generalized): Secondary | ICD-10-CM | POA: Diagnosis not present

## 2017-03-16 LAB — BASIC METABOLIC PANEL
Anion gap: 7 (ref 5–15)
BUN: 17 mg/dL (ref 4–21)
BUN: 17 mg/dL (ref 6–20)
CO2: 21 mmol/L — AB (ref 22–32)
CREATININE: 1 mg/dL (ref 0.5–1.1)
Calcium: 8.7 mg/dL — ABNORMAL LOW (ref 8.9–10.3)
Chloride: 111 mmol/L (ref 101–111)
Creatinine, Ser: 1.03 mg/dL — ABNORMAL HIGH (ref 0.44–1.00)
GFR calc Af Amer: 53 mL/min — ABNORMAL LOW (ref >60)
GFR, EST NON AFRICAN AMERICAN: 45 mL/min — AB (ref >60)
GLUCOSE: 119 mg/dL
GLUCOSE: 119 mg/dL — AB (ref 65–99)
POTASSIUM: 4 mmol/L (ref 3.5–5.1)
Sodium: 139 mmol/L (ref 135–145)
Sodium: 139 mmol/L (ref 137–147)

## 2017-03-16 LAB — PROTIME-INR
INR: 2.81
Prothrombin Time: 30.2 seconds — ABNORMAL HIGH (ref 11.4–15.2)

## 2017-03-16 MED ORDER — TRAMADOL HCL 50 MG PO TABS: 25.0000 mg | ORAL_TABLET | Freq: Three times a day (TID) | ORAL | 0 refills | Status: DC | PRN

## 2017-03-16 MED ORDER — VITAMINS A & D EX OINT
TOPICAL_OINTMENT | CUTANEOUS | Status: AC
  Administered 2017-03-16: 13:00:00
  Filled 2017-03-16: qty 5

## 2017-03-16 MED ORDER — HYDRALAZINE HCL 25 MG PO TABS: 25.0000 mg | ORAL_TABLET | Freq: Three times a day (TID) | ORAL | 0 refills | Status: DC

## 2017-03-16 MED ORDER — WARFARIN SODIUM 2.5 MG PO TABS: 2.5000 mg | ORAL_TABLET | Freq: Every evening | ORAL | Status: DC

## 2017-03-16 MED ORDER — LORAZEPAM 0.5 MG PO TABS: 0.2500 mg | ORAL_TABLET | Freq: Three times a day (TID) | ORAL | 0 refills | Status: DC | PRN

## 2017-03-16 MED ORDER — CEFUROXIME AXETIL 250 MG PO TABS: 250.0000 mg | ORAL_TABLET | Freq: Two times a day (BID) | ORAL | 0 refills | Status: AC

## 2017-03-16 MED ORDER — VITAMINS A & D EX OINT
TOPICAL_OINTMENT | CUTANEOUS | Status: AC
  Administered 2017-03-16: 5
  Filled 2017-03-16: qty 15

## 2017-03-16 NOTE — Discharge Instructions (Signed)
Spinal Compression Fracture A spinal compression fracture is a collapse of the bones that form the spine (vertebrae). With this type of fracture, the vertebrae become squashed (compressed) into a wedge shape. Most compression fractures happen in the middle or lower part of the spine. What are the causes? This condition may be caused by:  Thinning and loss of density in the bones (osteoporosis). This is the most common cause.  A fall.  A car or motorcycle accident.  Cancer.  Trauma, such as a heavy, direct hit to the head. What increases the risk? You may be at greater risk for a spinal compression fracture if you:  Are 50 years old or older.  Have osteoporosis.  Have certain types of cancer, including:  Multiple myeloma.  Lymphoma.  Prostate cancer.  Lung cancer.  Breast cancer. What are the signs or symptoms? Symptoms of this condition include:  Severe pain.  Pain that gets worse over time.  Pain that is worse when you stand, walk, sit, or bend.  Sudden pain that is so bad that it is hard for you to move.  Bending or humping of the spine.  Gradual loss of height.  Numbness, tingling, or weakness in the back and legs.  Trouble walking. Your symptoms will depend on the cause of the fracture and how quickly it develops. For example, fractures that are caused by osteoporosis can cause few symptoms, no symptoms, or symptoms that develop slowly over time. How is this diagnosed? This condition may be diagnosed based on symptoms, medical history, and a physical exam. During the physical exam, your health care provider may tap along the length of your spine to check for tenderness. Tests may be done to confirm the diagnosis. They may include:  A bone density test to check for osteoporosis.  Imaging tests, such as a spine X-ray, a CT scan, or MRI. How is this treated? Treatment for this condition depends on the cause and severity of the condition.Some fractures, such  as those that are caused by osteoporosis, may heal on their own with supportive care. This may include:  Pain medicine.  Rest.  A back brace.  Physical therapy exercises.  Medicine that reduces bone pain.  Calcium and vitamin D supplements. Fractures that cause the back to become misshapen, cause nerve pain or weakness, or do not respond to other treatment may be treated with a surgical procedure, such as:  Vertebroplasty. In this procedure, bone cement is injected into the collapsed vertebrae to stabilize them.  Balloon kyphoplasty. In this procedure, the collapsed vertebrae are expanded with a balloon and then bone cement is injected into them.  Spinal fusion. In this procedure, the collapsed vertebrae are connected (fused) to normal vertebrae. Follow these instructions at home: General instructions  Take medicines only as directed by your health care provider.  Do not drive or operate heavy machinery while taking pain medicine.  If directed, apply ice to the injured area:  Put ice in a plastic bag.  Place a towel between your skin and the bag.  Leave the ice on for 30 minutes every two hours at first. Then apply the ice as needed.  Wear your neck brace or back brace as directed by your health care provider.  Do not drink alcohol. Alcohol can interfere with your treatment.  Keep all follow-up visits as directed by your health care provider. This is important. It can help to prevent permanent injury, disability, and long-lasting (chronic) pain. Activity  Stay in bed (on   bed rest) only as directed by your health care provider. Being on bed rest for too long can make your condition worse.  Return to your normal activities as directed by your health care provider. Ask what activities are safe for you.  Do exercises to improve motion and strength in your back (physical therapy), as recommended by your health care provider.  Exercise regularly as directed by your health  care provider. Contact a health care provider if:  You have a fever.  You develop a cough that makes your pain worse.  Your pain medicine is not helping.  Your pain does not get better over time.  You cannot return to your normal activities as planned or expected. Get help right away if:  Your pain is very bad and it suddenly gets worse.  You are unable to move any body part (paralysis) that is below the level of your injury.  You have numbness, tingling, or weakness in any body part that is below the level of your injury.  You cannot control your bladder or bowels. This information is not intended to replace advice given to you by your health care provider. Make sure you discuss any questions you have with your health care provider. Document Released: 10/18/2005 Document Revised: 06/15/2016 Document Reviewed: 10/22/2014 Elsevier Interactive Patient Education  2017 Elsevier Inc.  

## 2017-03-16 NOTE — Consult Note (Signed)
[facility] Nurse wound consult note Reason for Consult: incontinence skin damage Wound type: Moisture associated incontinence dermititis Pressure Injury POA: N/A Measurement:na Wound bed:na Drainage (amount, consistency, odor) na Periwound: intact Dressing procedure/placement/frequency: I have provided nurses with orders for Using critic-aid purple top barrier after incontinence cleanser after each incontinence episode.  Allevyn foam dressing applied to sacrum for protection, no wound present. We will not follow, but will remain available to this patient, to nursing, and the medical and/or surgical teams.  Please re-consult if we need to assist further.    Fara Olden, RN-C, WTA-C Wound Treatment Associate

## 2017-03-16 NOTE — Clinical Social Work Placement (Addendum)
Pt DC to [facility] for rehab. Transportation will be provided by [facility] completed medical necessity form and called for transport.  Family notified of DC- son [name] [phone], spoke with CSW and is agreeable to plan. Wishes to know when pt leaves for [facility] so he may meet pt there.  All information faxed via the [facility].  Report # for RN: [phone]   No other needs at this time.   Plan: DC to [facility].   CLINICAL SOCIAL WORK PLACEMENT  NOTE  Date:  03/16/2017  Patient Details  Name: ALLETTA MATTOS MRN: [id number] Date of Birth: 02-17-1921  Clinical Social Work is seeking post-discharge placement for this patient at the [place] level of care (*CSW will initial, date and re-position this form in  chart as items are completed):  Yes   Patient/family provided with [facility] Work Department's list of facilities offering this level of care within the geographic area requested by the patient (or if unable, by the patient's family).  Yes   Patient/family informed of their freedom to choose among providers that offer the needed level of care, that participate in Medicare, Medicaid or managed care program needed by the patient, have an available bed and are willing to accept the patient.  Yes   Patient/family informed of [facility]'s ownership interest in [facility] and [facility], as well as of the fact that they are under no obligation to receive care at these facilities.  PASRR submitted to EDS on       PASRR number received on       Existing PASRR number confirmed on 03/13/17     FL2 transmitted to all facilities in geographic area requested by pt/family on 03/13/17     FL2 transmitted to all facilities within larger geographic area on       Patient informed that his/her managed care company has contracts with or will negotiate with certain facilities, including the following:        Yes   Patient/family informed of  bed offers received.  Patient chooses bed at [facility] and [facility] recommends and patient chooses bed at [facility]    Patient to be transferred to [facility] on 03/16/17.  Patient to be transferred to facility by [facility]     Patient family notified on 03/16/17 of transfer.  Name of family member notified:  Son [name]     PHYSICIAN       Additional Comment:    _______________________________________________ Nila Nephew, LCSW 03/16/2017, 12:21 PM  [phone]

## 2017-03-16 NOTE — Progress Notes (Signed)
Pt discharged to [facility] [address] Report called Olu, RN. Pt stable with no c/o pain or distress. Pt clothes and books sent with pt. Pt's son [name] called to update on discharge status.

## 2017-03-16 NOTE — Discharge Summary (Signed)
Physician Discharge Summary  [name] [name] KDX:[id number] DOB: [birth year] DOA: 03/11/2017  PCP: Deland Pretty, MD  Admit date: 03/11/2017 Discharge date: 03/16/2017  Time spent: 45 minutes  Recommendations for Outpatient Follow-up:  Patient will be discharged to skilled nursing facility. Continue physical and occupational therapy.  Patient will need to follow up with primary care provider within one week of discharge, repeat CBC and BMP. Restart Coumadin on 03/17/2017 and recheck INR in 2 days.  Patient should continue medications as prescribed.  Patient should follow a heart healthy diet.   Discharge Diagnoses:  L2 subacute compression fracture Urinary tract infection Polymyalgia rheumatica Essential hypertension Dementia Depression/anxiety Acute kidney injury History of pulmonary embolism/supratherapeutic INR Chronic normocytic anemia Moisture associated dermatitis  Discharge Condition: Stable  Diet recommendation: Heart healthy  Filed Weights   03/11/17 1813 03/12/17 0123  Weight: 62.1 kg (137 lb) 66.5 kg (146 lb 11.2 oz)    History of present illness:  On 03/12/2017 by Dr. Lily [name] [name] [name] is a 83 y.o. woman with a history of VTE (anticoagulated with warfarin), Polymyalgia rheumatica, restless leg syndrome, HTN, GERD, CAD S/P multiple stents, and breast cancer who also has a known history of prior compression fractures.  She had an accidental fall on Monday (witnessed by her son, no loss of consciousness) then developed pain in her mid-back.  She was evaluated by the NP in her PCP's office and referred for xrays, which did not show any acute fractures.  Since then, she has increased her tramadol and lorazepam dosing to TID, and she has been using acetaminophen 325mg  as needed for pain management.  Pain is still uncontrolled with any movement.  She is unable to ambulate in the home.  She is no longer able to feed herself without significant pain.  Pain is 9-10 out  of 10 in intensity with any movement; moderate at rest.  No fever.  No headache.  No nausea or vomiting.  No abdominal pain.  No dysuria.  No chest pain or shortness of breath.  Hospital Course:  L2 subacute compression fracture -patient is on chronic prednisone -Continue pain control -PT consulted and recommended SNF  Urinary tract infection -Patient was placed on ceftriaxone -Will discharge patient with Ceftin 250 mg twice a day  Polymyalgia rheumatica -Continue low-dose prednisone -Should be discussed with primary care physician or prescribing physician regarding fractures and prednisone use  Essential hypertension -Continue amlodipine, losartan, hydralazine  Dementia -Continue Aricept  Depression/anxiety -Continue sertraline, lorazepam  Acute kidney injury -Creatinine up to 1.22, currently 1.03 -Based and creatinine 0.9 -Repeat BMP in 1 week  History of pulmonary embolism/supratherapeutic INR -Patient noted to have massive PE in 2011 and was placed on Coumadin -INR up to 4.6 -Coumadin was held, INR currently 2.81 -Resume on 03/17/2017, recheck INR on 03/18/2017  Chronic normocytic anemia -Baseline hemoglobin approximately 11, currently 10.3  Moisture associated dermatitis -Noted on gluteal cleft -Likely secondary to incontinence -Wound care consulted, recommended Using critic-aid purple top barrier after incontinence cleanser after each incontinence episode.  Allevyn foam dressing applied to sacrum for protection, no wound present  Procedures: None  Consultations: None  Discharge Exam: Vitals:   03/15/17 2256 03/16/17 0524  BP: (!) 156/51 (!) 164/46  Pulse:  63  Resp:  14  Temp:  97.9 F (36.6 C)     General: Well developed, well nourished, NAD, appears stated age  HEENT: NCAT, mucous membranes moist.  Cardiovascular: S1 S2 auscultated, soft SEM, RRR  Respiratory: Clear to auscultation  bilaterally  Abdomen: Soft, nontender, nondistended, + bowel  sounds  Extremities: warm dry without cyanosis clubbing or edema  Neuro: AAOx2, nonfocal  Skin: mild erythema on gluteal cleft  Psych: Normal affect and demeanor   Discharge Instructions Discharge Instructions    Discharge instructions    Complete by:  As directed    Patient will be discharged to skilled nursing facility. Continue physical and occupational therapy.  Patient will need to follow up with primary care provider within one week of discharge, repeat CBC and BMP. Restart Coumadin on 03/17/2017 and recheck INR in 2 days.  Patient should continue medications as prescribed.  Patient should follow a heart healthy diet.     Current Discharge Medication List    START taking these medications   Details  cefUROXime (CEFTIN) 250 MG tablet Take 1 tablet (250 mg total) by mouth 2 (two) times daily. Qty: 8 tablet, Refills: 0    hydrALAZINE (APRESOLINE) 25 MG tablet Take 1 tablet (25 mg total) by mouth every 8 (eight) hours. Qty: 90 tablet, Refills: 0      CONTINUE these medications which have CHANGED   Details  LORazepam (ATIVAN) 0.5 MG tablet Take 0.5 tablets (0.25 mg total) by mouth 3 (three) times daily as needed for anxiety. Qty: 10 tablet, Refills: 0    traMADol (ULTRAM) 50 MG tablet Take 0.5 tablets (25 mg total) by mouth 3 (three) times daily as needed for moderate pain or severe pain. Qty: 20 tablet, Refills: 0    warfarin (COUMADIN) 2.5 MG tablet Take 1-1.5 tablets (2.5-3.75 mg total) by mouth every evening. Take 2.5 mg every day except on Wednesday take 3.75 mg      CONTINUE these medications which have NOT CHANGED   Details  acetaminophen (TYLENOL) 500 MG tablet Take 500 mg by mouth 3 (three) times daily as needed for moderate pain (NOT MORE THAN 3,000 MG IN 24 HOUR PERIOD). For pain    amLODipine (NORVASC) 10 MG tablet Take 10 mg by mouth every morning.     aspirin EC 81 MG tablet Take 1 tablet (81 mg total) by mouth daily. Qty: 30 tablet, Refills: 0      atorvastatin (LIPITOR) 20 MG tablet Take 10 mg by mouth every evening.     cholecalciferol (VITAMIN D) 1000 UNITS tablet Take 2,000 Units by mouth daily with lunch.     cholestyramine light (PREVALITE) 4 G packet Take 4 g by mouth daily as needed (for loose bowels).    donepezil (ARICEPT) 10 MG tablet Take 10 mg by mouth every morning.    fluticasone (FLONASE) 50 MCG/ACT nasal spray Place 1 spray into both nostrils daily as needed for allergies or rhinitis.    loratadine (CLARITIN) 10 MG tablet Take 10 mg by mouth daily as needed for allergies. Reported on 05/03/2016    losartan (COZAAR) 100 MG tablet Take 50 mg by mouth at bedtime.     nitroGLYCERIN (NITROSTAT) 0.4 MG SL tablet Place 0.4 mg under the tongue every 5 (five) minutes as needed for chest pain.    nystatin (NYSTATIN) powder Apply 1 g topically daily as needed (irrittation).    oxybutynin (DITROPAN) 5 MG tablet Take 5 mg by mouth 2 (two) times daily.  Refills: 0    pantoprazole (PROTONIX) 40 MG tablet Take 40 mg by mouth 2 (two) times daily as needed (for heartburn).    Pediatric Multiple Vit-C-FA (CHILDRENS CHEWABLE MULTI VITS PO) Take 1 tablet by mouth daily with lunch.  potassium chloride SA (K-DUR,KLOR-CON) 20 MEQ tablet Take 20 mEq by mouth daily with lunch.     predniSONE (DELTASONE) 1 MG tablet Take 1 mg by mouth daily with breakfast.  Refills: 0    rOPINIRole (REQUIP) 1 MG tablet Take 1 mg by mouth 3 (three) times daily. 1mg  at noon, 1mg  2 hours before bed, then 1mg  at bedtime    sertraline (ZOLOFT) 25 MG tablet Take 25 mg by mouth every morning.     SILENOR 6 MG TABS Take 6 mg by mouth at bedtime.  Refills: 1       Allergies  Allergen Reactions  . Ambien [Zolpidem Tartrate] Nausea And Vomiting  . Codeine Phosphate Nausea And Vomiting  . Darvocet [Propoxyphene N-Acetaminophen] Nausea And Vomiting  . Promethazine Hcl Nausea And Vomiting  . Sulfamethoxazole Other (See Comments)    Pt doesn't remember  reaction  . Vicodin [Hydrocodone-Acetaminophen] Other (See Comments)    Unknown reaction  . Amoxicillin Nausea And Vomiting and Rash  . Penicillins Nausea And Vomiting and Rash    Contact information for follow-up providers    Deland Pretty, MD. Schedule an appointment as soon as possible for a visit in 1 week(s).   Specialty:  Internal Medicine Why:  Hospital follow up Contact information: [address] [place] [address] [phone]            Contact information for after-discharge care    Destination    [name] Follow up.   Specialty:  Skilled Nursing Facility Contact information: [address] [place] [phone]                   The results of significant diagnostics from this hospitalization (including imaging, microbiology, ancillary and laboratory) are listed below for reference.    Significant Diagnostic Studies: Dg Chest 2 View  Result Date: 03/11/2017 CLINICAL DATA:  Fall 4 days prior with severe low back pain. History of breast cancer. EXAM: CHEST  2 VIEW COMPARISON:  Frontal and lateral views 05/27/2016 FINDINGS: Unchanged elevation of right hemidiaphragm allowing for differences in technique. Stable heart size and mediastinal contours. Tortuosity atherosclerosis of the thoracic aorta. Minimal bibasilar atelectasis or scarring. No consolidation, pleural effusion, pneumothorax or pulmonary edema. The bones are under mineralized. No grossly displaced rib fracture. IMPRESSION: No acute abnormality.  Chronic elevation of right hemidiaphragm. Tortuous thoracic aorta with atherosclerosis. Electronically Signed   By: Jeb Levering M.D.   On: 03/11/2017 21:29   Ct Thoracic Spine Wo Contrast  Result Date: 03/11/2017 CLINICAL DATA:  Fall with back pain.  History of breast cancer. EXAM: CT THORACIC AND LUMBAR SPINE WITHOUT CONTRAST TECHNIQUE: Multidetector CT imaging of the thoracic and  lumbar spine was performed without contrast. Multiplanar CT image reconstructions were also generated. COMPARISON:  Lumbar spine radiograph 03/08/2017 Thoracic spine CT 11/14/2015 Lumbar spine MRI 07/20/2015 FINDINGS: CT THORACIC SPINE FINDINGS Alignment: Normal Vertebrae: There are compression deformities of the T9 and T12 vertebrae, both of which are chronic and unchanged compared to 11/14/2015. Paraspinal and other soft tissues: Aortic atherosclerosis. Otherwise unremarkable. Disc levels: No bony spinal canal stenosis. Intervertebral disc spaces are maintained. No evidence of traumatic herniation. CT LUMBAR SPINE FINDINGS Segmentation: Normal Alignment: Normal Vertebrae: Compression deformity of the L3 vertebral body is unchanged compared to MRI of 07/20/2015. L2 compression deformity is new compared to this MR and the CT of 11/14/2015, but unchanged compared to the recent radiograph of 03/08/2017. There is no  adjacent hematoma or soft tissue abnormality. There is gas in the disc space and underlying the endplate. Paraspinal and other soft tissues: Aortic atherosclerosis. Disc levels: No evidence of traumatic disc herniation. No bony spinal canal stenosis. There is bilateral L4-L5 and L5-S1 neural foraminal narrowing, worst at right L4-5. IMPRESSION: 1. L2 superior endplate compression fracture is age indeterminate. Given the presence of an intravertebral vacuum cleft, this is favored to be a subacute fracture (within 2 weeks). Recommend correlation with history of trauma). 2. Chronic compression deformities at T9, T12 and L3. 3. No bony spinal canal stenosis or evidence of traumatic disc herniation. 4. Aortic atherosclerosis. Electronically Signed   By: Ulyses Jarred M.D.   On: 03/11/2017 21:29   Ct Lumbar Spine Wo Contrast  Result Date: 03/11/2017 CLINICAL DATA:  Fall with back pain.  History of breast cancer. EXAM: CT THORACIC AND LUMBAR SPINE WITHOUT CONTRAST TECHNIQUE: Multidetector CT imaging of the  thoracic and lumbar spine was performed without contrast. Multiplanar CT image reconstructions were also generated. COMPARISON:  Lumbar spine radiograph 03/08/2017 Thoracic spine CT 11/14/2015 Lumbar spine MRI 07/20/2015 FINDINGS: CT THORACIC SPINE FINDINGS Alignment: Normal Vertebrae: There are compression deformities of the T9 and T12 vertebrae, both of which are chronic and unchanged compared to 11/14/2015. Paraspinal and other soft tissues: Aortic atherosclerosis. Otherwise unremarkable. Disc levels: No bony spinal canal stenosis. Intervertebral disc spaces are maintained. No evidence of traumatic herniation. CT LUMBAR SPINE FINDINGS Segmentation: Normal Alignment: Normal Vertebrae: Compression deformity of the L3 vertebral body is unchanged compared to MRI of 07/20/2015. L2 compression deformity is new compared to this MR and the CT of 11/14/2015, but unchanged compared to the recent radiograph of 03/08/2017. There is no adjacent hematoma or soft tissue abnormality. There is gas in the disc space and underlying the endplate. Paraspinal and other soft tissues: Aortic atherosclerosis. Disc levels: No evidence of traumatic disc herniation. No bony spinal canal stenosis. There is bilateral L4-L5 and L5-S1 neural foraminal narrowing, worst at right L4-5. IMPRESSION: 1. L2 superior endplate compression fracture is age indeterminate. Given the presence of an intravertebral vacuum cleft, this is favored to be a subacute fracture (within 2 weeks). Recommend correlation with history of trauma). 2. Chronic compression deformities at T9, T12 and L3. 3. No bony spinal canal stenosis or evidence of traumatic disc herniation. 4. Aortic atherosclerosis. Electronically Signed   By: Ulyses Jarred M.D.   On: 03/11/2017 21:29    Microbiology: Recent Results (from the past 240 hour(s))  Culture, Urine     Status: Abnormal   Collection Time: 03/11/17  9:30 PM  Result Value Ref Range Status   Specimen Description URINE, CLEAN  CATCH  Final   Special Requests NONE  Final   Culture >=100,000 COLONIES/mL CITROBACTER BRAAKII (A)  Final   Report Status 03/14/2017 FINAL  Final   Organism ID, Bacteria CITROBACTER BRAAKII (A)  Final      Susceptibility   Citrobacter braakii - MIC*    CEFAZOLIN >=64 RESISTANT Resistant     CEFTRIAXONE <=1 SENSITIVE Sensitive     CIPROFLOXACIN <=0.25 SENSITIVE Sensitive     GENTAMICIN <=1 SENSITIVE Sensitive     IMIPENEM 1 SENSITIVE Sensitive     NITROFURANTOIN <=16 SENSITIVE Sensitive     TRIMETH/SULFA <=20 SENSITIVE Sensitive     PIP/TAZO <=4 SENSITIVE Sensitive     * >=100,000 COLONIES/mL CITROBACTER BRAAKII     Labs: Basic Metabolic Panel:  Recent Labs Lab 03/11/17 2031 03/13/17 0255 03/14/17 0351 03/15/17 0402 03/16/17  0400  NA 137 140 137 137 139  K 3.5 3.5 4.1 4.1 4.0  CL 105 109 107 110 111  CO2 22 21* 20* 19* 21*  GLUCOSE 104* 118* 98 100* 119*  BUN 13 14 16 18 17   CREATININE 0.96 0.99 1.22* 1.15* 1.03*  CALCIUM 8.8* 9.0 8.3* 8.4* 8.7*   Liver Function Tests:  Recent Labs Lab 03/11/17 2031  AST 17  ALT 14  ALKPHOS 60  BILITOT 0.5  PROT 6.7  ALBUMIN 3.4*   No results for input(s): LIPASE, AMYLASE in the last 168 hours. No results for input(s): AMMONIA in the last 168 hours. CBC:  Recent Labs Lab 03/11/17 2031 03/12/17 0437 03/13/17 0255 03/15/17 0402  WBC 11.2* 9.5 10.1 7.7  NEUTROABS 8.2*  --  6.5 4.2  HGB 12.0 10.8* 12.7 10.3*  HCT 36.6 33.0* 38.6 32.1*  MCV 84.7 84.4 84.5 85.4  PLT 276 264 299 290   Cardiac Enzymes:  Recent Labs Lab 03/13/17 0255  TROPONINI 0.03*   BNP: BNP (last 3 results) No results for input(s): BNP in the last 8760 hours.  ProBNP (last 3 results) No results for input(s): PROBNP in the last 8760 hours.  CBG: No results for input(s): GLUCAP in the last 168 hours.     SignedCristal Ford  [facility] 03/16/2017, 1:41 PM

## 2017-03-17 ENCOUNTER — Non-Acute Institutional Stay (SKILLED_NURSING_FACILITY): Payer: Medicare Other | Admitting: Internal Medicine

## 2017-03-17 ENCOUNTER — Encounter: Payer: Self-pay | Admitting: Internal Medicine

## 2017-03-17 DIAGNOSIS — Z86711 Personal history of pulmonary embolism: Secondary | ICD-10-CM

## 2017-03-17 DIAGNOSIS — N390 Urinary tract infection, site not specified: Secondary | ICD-10-CM | POA: Diagnosis not present

## 2017-03-17 DIAGNOSIS — I10 Essential (primary) hypertension: Secondary | ICD-10-CM

## 2017-03-17 DIAGNOSIS — F0280 Dementia in other diseases classified elsewhere without behavioral disturbance: Secondary | ICD-10-CM

## 2017-03-17 DIAGNOSIS — G301 Alzheimer's disease with late onset: Secondary | ICD-10-CM

## 2017-03-17 DIAGNOSIS — A498 Other bacterial infections of unspecified site: Secondary | ICD-10-CM | POA: Diagnosis not present

## 2017-03-17 DIAGNOSIS — N179 Acute kidney failure, unspecified: Secondary | ICD-10-CM

## 2017-03-17 DIAGNOSIS — M353 Polymyalgia rheumatica: Secondary | ICD-10-CM | POA: Diagnosis not present

## 2017-03-17 DIAGNOSIS — B372 Candidiasis of skin and nail: Secondary | ICD-10-CM

## 2017-03-17 DIAGNOSIS — S32020D Wedge compression fracture of second lumbar vertebra, subsequent encounter for fracture with routine healing: Secondary | ICD-10-CM

## 2017-03-17 DIAGNOSIS — R791 Abnormal coagulation profile: Secondary | ICD-10-CM

## 2017-03-17 NOTE — Progress Notes (Signed)
: Provider:  [name], MD Location:  [facility] Room Number: [phone] Place of Service:  SNF [phone])  PCP: Deland Pretty, MD Patient Care Team: Deland Pretty, MD as PCP - General  Extended Emergency Contact Information Primary Emergency Contact: Maqueda,[name] Address: [place], [place] [address] [name] of [place] Phone: [phone] Mobile Phone: [phone] Relation: Son Secondary Emergency Contact: [place] of [place] Phone: [phone] Relation: Daughter     Allergies: Ambien [zolpidem tartrate]; Codeine phosphate; Darvocet [propoxyphene n-acetaminophen]; Promethazine hcl; Sulfamethoxazole; Vicodin [hydrocodone-acetaminophen]; Amoxicillin; and Penicillins  Chief Complaint  Patient presents with  . New Admit To SNF    Admit to Facility    HPI: Patient is 83 y.o. female with a history of VTE (anticoagulated with warfarin), Polymyalgia rheumatica, restless leg syndrome, HTN, GERD, CAD S/P multiple stents, and breast cancer who also has a known history of prior compression fractures.  She had an accidental fall on Monday (witnessed by her son, no loss of consciousness) then developed pain in her mid-back.  She was evaluated by the NP in her PCP's office and referred for xrays, which did not show any acute fractures.  Since then, she has increased her tramadol and lorazepam dosing to TID, and she has been using acetaminophen 325mg  as needed for pain management.  Pain is still uncontrolled with any movement.  She is unable to ambulate in the home.  She is no longer able to feed herself without significant pain.Pain is 9-10 out of 10 in intensity with any movement; moderate at rest.  No fever.  No headache.  No nausea or vomiting.  No abdominal pain.  No dysuria.  No chest pain or shortness of breath.CT of the thoracic and lumbar spines show subacute L2 superior endplate compression fracture  (age indeterminate but likely happened in the past two weeks based on radiologic features) and chronic compression fractures at T9, T12, and L3.  Chest xray negative for acute process.  WBC count 11.  U/A is nitrite positive with large leukocytes, TNTC WBC, and few bacteria.pt was admitted to [facility] from 5/11-16 where she received IV fentanyl and IV ROCEPHIN. Pt was treated for pain and urine grew out citrobacter braakii and pt was transitioned to Keflex. Pt is admitted to SNF for OT/PT. While at SNF pt will be followed for polymyalgia rhematica, tx with prednisone, HTN, tx with norvasc, losartan and hydralazine and dementia, tx with aricept.  Past Medical History:  Diagnosis Date  . Anxiety   . Axillary abscess   . CAD (coronary artery disease)    S/p PTCA / stenting (last cath 2004, multiple LAD stents, 2 stents in the right coronary artery all patent)  . Cancer ([place])   . Complication of anesthesia   . Dementia   . GERD (gastroesophageal reflux disease)   . HTN (hypertension)   . PMR (polymyalgia rheumatica) (HCC)   . PONV (postoperative nausea and vomiting)   . Restless leg syndrome   . 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 Started coumadin    Past Surgical History:  Procedure Laterality Date  . BREAST LUMPECTOMY    . COLECTOMY    . ESOPHAGOGASTRODUODENOSCOPY (EGD) WITH ESOPHAGEAL DILATION  10/10/2012   Procedure: ESOPHAGOGASTRODUODENOSCOPY (EGD) WITH ESOPHAGEAL DILATION;  Surgeon: Inda Castle, MD;  Location: [facility];  Service: Endoscopy;  Laterality: N/A;  . heart stents  x  8  . INCISION AND DRAINAGE     bilateral axillary, non specific staff  . INCISION AND DRAINAGE ABSCESS  09/28/2012   Procedure: INCISION AND DRAINAGE ABSCESS;  Surgeon: Zenovia Jarred, MD;  Location: [facility];  Service: General;  Laterality: Bilateral;  . stent     cardiac x 8 stents.    Allergies as of 03/17/2017      Reactions   Ambien [zolpidem Tartrate] Nausea And Vomiting   Codeine  Phosphate Nausea And Vomiting   Darvocet [propoxyphene N-acetaminophen] Nausea And Vomiting   Promethazine Hcl Nausea And Vomiting   Sulfamethoxazole Other (See Comments)   Pt doesn't remember reaction   Vicodin [hydrocodone-acetaminophen] Other (See Comments)   Unknown reaction   Amoxicillin Nausea And Vomiting, Rash   Penicillins Nausea And Vomiting, Rash      Medication List       Accurate as of 03/17/17 10:23 AM. Always use your most recent med list.          acetaminophen 500 MG tablet Commonly known as:  TYLENOL Take 500 mg by mouth 3 (three) times daily as needed for moderate pain (NOT MORE THAN 3,000 MG IN 24 HOUR PERIOD). For pain   amLODipine 10 MG tablet Commonly known as:  NORVASC Take 10 mg by mouth every morning.   aspirin EC 81 MG tablet Take 1 tablet (81 mg total) by mouth daily.   atorvastatin 20 MG tablet Commonly known as:  LIPITOR Take 10 mg by mouth every evening.   cefUROXime 250 MG tablet Commonly known as:  CEFTIN Take 1 tablet (250 mg total) by mouth 2 (two) times daily.   CHILDRENS CHEWABLE MULTI VITS PO Take 1 tablet by mouth daily with lunch.   cholecalciferol 1000 units tablet Commonly known as:  VITAMIN D Take 2,000 Units by mouth daily with lunch.   cholestyramine light 4 g packet Commonly known as:  PREVALITE Take 4 g by mouth daily as needed (for loose bowels).   donepezil 10 MG tablet Commonly known as:  ARICEPT Take 10 mg by mouth every morning.   fluticasone 50 MCG/ACT nasal spray Commonly known as:  FLONASE Place 1 spray into both nostrils daily as needed for allergies or rhinitis.   hydrALAZINE 25 MG tablet Commonly known as:  APRESOLINE Take 1 tablet (25 mg total) by mouth every 8 (eight) hours.   loratadine 10 MG tablet Commonly known as:  CLARITIN Take 10 mg by mouth daily as needed for allergies. Reported on 05/03/2016   LORazepam 0.5 MG tablet Commonly known as:  ATIVAN Take 0.5 tablets (0.25 mg total) by mouth  3 (three) times daily as needed for anxiety.   losartan 100 MG tablet Commonly known as:  COZAAR Take 50 mg by mouth at bedtime.   nitroGLYCERIN 0.4 MG SL tablet Commonly known as:  NITROSTAT Place 0.4 mg under the tongue every 5 (five) minutes as needed for chest pain.   nystatin powder Generic drug:  nystatin Apply 1 g topically daily as needed (irrittation).   oxybutynin 5 MG tablet Commonly known as:  DITROPAN Take 5 mg by mouth 2 (two) times daily.   pantoprazole 40 MG tablet Commonly known as:  PROTONIX Take 40 mg by mouth 2 (two) times daily as needed (for heartburn).   potassium chloride SA 20 MEQ tablet Commonly known as:  K-DUR,KLOR-CON Take 20 mEq by mouth daily with lunch.   predniSONE 1 MG tablet Commonly known as:  DELTASONE Take 1 mg by mouth  daily with breakfast.   rOPINIRole 1 MG tablet Commonly known as:  REQUIP Take 1 mg by mouth 3 (three) times daily. 1mg  at noon, 1mg  2 hours before bed, then 1mg  at bedtime   sertraline 25 MG tablet Commonly known as:  ZOLOFT Take 25 mg by mouth every morning.   SILENOR 6 MG Tabs Generic drug:  Doxepin HCl Take 6 mg by mouth at bedtime.   traMADol 50 MG tablet Commonly known as:  ULTRAM Take 0.5 tablets (25 mg total) by mouth 3 (three) times daily as needed for moderate pain or severe pain.   warfarin 2.5 MG tablet Commonly known as:  COUMADIN Take 1-1.5 tablets (2.5-3.75 mg total) by mouth every evening. Take 2.5 mg every day except on Wednesday take 3.75 mg       No orders of the defined types were placed in this encounter.    There is no immunization history on file for this patient.  Social History  Substance Use Topics  . Smoking status: Never Smoker  . Smokeless tobacco: Never Used  . Alcohol use No    Family history is   Family History  Problem Relation Age of Onset  . Breast cancer Sister       Review of Systems  DATA OBTAINED: from patient, nurse GENERAL:  no fevers, fatigue,  appetite changes SKIN: No itching, or rash EYES: No eye pain, redness, discharge EARS: No earache, tinnitus, change in hearing NOSE: No congestion, drainage or bleeding  MOUTH/THROAT: No mouth or tooth pain, No sore throat RESPIRATORY: No cough, wheezing, SOB CARDIAC: No chest pain, palpitations, lower extremity edema  GI: No abdominal pain, No N/V/D or constipation, No heartburn or reflux  GU: No dysuria, frequency or urgency, or incontinence  MUSCULOSKELETAL: No unrelieved bone/joint pain NEUROLOGIC: No headache, dizziness or focal weakness PSYCHIATRIC: No c/o anxiety or sadness   Vitals:   03/17/17 0957  BP: 136/73  Pulse: 62  Resp: 18  Temp: 98.2 F (36.8 C)    SpO2 Readings from Last 1 Encounters:  03/17/17 97%   Body mass index is 25.87 kg/m.     Physical Exam  GENERAL APPEARANCE: Alert, conversant,  No acute distress.  SKIN: No diaphoresis rash HEAD: Normocephalic, atraumatic  EYES: Conjunctiva/lids clear. Pupils round, reactive. EOMs intact.  EARS: External exam WNL, canals clear. Hearing grossly normal.  NOSE: No deformity or discharge.  MOUTH/THROAT: Lips w/o lesions  RESPIRATORY: Breathing is even, unlabored. Lung sounds are clear   CARDIOVASCULAR: Heart RRR no murmurs, rubs or gallops. No peripheral edema.   GASTROINTESTINAL: Abdomen is soft, non-tender, not distended w/ normal bowel sounds. GENITOURINARY: Bladder non tender, not distended  MUSCULOSKELETAL: No abnormal joints or musculature NEUROLOGIC:  Cranial nerves 2-12 grossly intact. Moves all extremities  PSYCHIATRIC: Mood and affect appropriate to situation, no behavioral issues  Patient Active Problem List   Diagnosis Date Noted  . Compression fracture of L2 ([place]) 03/12/2017  . Acute lower UTI 03/12/2017  . Accelerated hypertension 03/12/2017  . Leukocytosis 07/31/2015  . Acute bronchitis 07/28/2015  . Chronic anticoagulation 07/28/2015  . Intractable back pain 07/20/2015  . Back pain  07/20/2015  . Cellulitis and abscess of left buttock 04/12/2013  . Stricture and stenosis of esophagus 10/10/2012  . Dementia 10/06/2012  . Dysphagia 10/06/2012  . Axillary abscess - bilateral, multiple 09/27/2012  . Hypotension 08/24/2012  . Near syncope 08/24/2012  . Abscess of axilla, left 06/27/2012  . Bradycardia 06/13/2012  . Abscess 06/11/2012  . Chest  pain 06/09/2012  . Cellulitis 06/09/2012  . Weakness of both legs 06/09/2012  . PMR (polymyalgia rheumatica) (HCC)   . Restless leg syndrome   . Angina pectoris, unstable ([place]) 03/02/2012  . Anemia 03/02/2012  . Dyspnea 02/03/2012  . UTI (urinary tract infection) 02/03/2012  . CAD (coronary artery disease)   . HTN (hypertension)   . VTE (venous thromboembolism) 10/01/2010  . HYPERCHOLESTEROLEMIA  IIA 08/27/2009  . Hyperlipidemia 12/11/2008  . HYPERTENSION, BENIGN 12/11/2008      Labs reviewed: Basic Metabolic Panel:    Component Value Date/Time   NA 139 03/16/2017 0400   NA 139 03/16/2017   K 4.0 03/16/2017 0400   CL 111 03/16/2017 0400   CO2 21 (L) 03/16/2017 0400   GLUCOSE 119 (H) 03/16/2017 0400   BUN 17 03/16/2017 0400   BUN 17 03/16/2017   CREATININE 1.03 (H) 03/16/2017 0400   CALCIUM 8.7 (L) 03/16/2017 0400   PROT 6.7 03/11/2017 2031   ALBUMIN 3.4 (L) 03/11/2017 2031   AST 17 03/11/2017 2031   ALT 14 03/11/2017 2031   ALKPHOS 60 03/11/2017 2031   BILITOT 0.5 03/11/2017 2031   GFRNONAA 45 (L) 03/16/2017 0400   GFRAA 53 (L) 03/16/2017 0400     Recent Labs  03/14/17 0351 03/15/17 03/15/17 0402 03/16/17 03/16/17 0400  NA 137 137 137 139 139  K 4.1 4.1 4.1  --  4.0  CL 107  --  110  --  111  CO2 20*  --  19*  --  21*  GLUCOSE 98  --  100*  --  119*  BUN 16 18 18 17 17   CREATININE 1.22* 1.2* 1.15* 1.0 1.03*  CALCIUM 8.3*  --  8.4*  --  8.7*   Liver Function Tests:  Recent Labs  03/11/17 03/11/17 2031  AST 17 17  ALT 14 14  ALKPHOS 60 60  BILITOT  --  0.5  PROT  --  6.7  ALBUMIN  --   3.4*   No results for input(s): LIPASE, AMYLASE in the last 8760 hours. No results for input(s): AMMONIA in the last 8760 hours. CBC:  Recent Labs  03/11/17 2031  03/12/17 0437  03/13/17 0255 03/15/17 03/15/17 0402  WBC 11.2*  < > 9.5  < > 10.1 7.7 7.7  NEUTROABS 8.2*  --   --   --  6.5  --  4.2  HGB 12.0  < > 10.8*  < > 12.7 10.3* 10.3*  HCT 36.6  < > 33.0*  < > 38.6 32* 32.1*  MCV 84.7  --  84.4  --  84.5  --  85.4  PLT 276  < > 264  < > 299 29* 290  < > = values in this interval not displayed. Lipid No results for input(s): CHOL, HDL, LDLCALC, TRIG in the last 8760 hours.  Cardiac Enzymes:  Recent Labs  03/13/17 0255  TROPONINI 0.03*   BNP: No results for input(s): BNP in the last 8760 hours. No results found for: MICROALBUR No results found for: HGBA1C No results found for: TSH No results found for: VITAMINB12 No results found for: FOLATE Lab Results  Component Value Date   IRON 28 (L) 03/02/2012   TIBC 386 03/02/2012   FERRITIN 8 (L) 03/02/2012    Imaging and Procedures obtained prior to SNF admission: Dg Chest 2 View  Result Date: 03/11/2017 CLINICAL DATA:  Fall 4 days prior with severe low back pain. History of breast cancer. EXAM: CHEST  2 VIEW COMPARISON:  Frontal and lateral views 05/27/2016 FINDINGS: Unchanged elevation of right hemidiaphragm allowing for differences in technique. Stable heart size and mediastinal contours. Tortuosity atherosclerosis of the thoracic aorta. Minimal bibasilar atelectasis or scarring. No consolidation, pleural effusion, pneumothorax or pulmonary edema. The bones are under mineralized. No grossly displaced rib fracture. IMPRESSION: No acute abnormality.  Chronic elevation of right hemidiaphragm. Tortuous thoracic aorta with atherosclerosis. Electronically Signed   By: Jeb Levering M.D.   On: 03/11/2017 21:29   Ct Thoracic Spine Wo Contrast  Result Date: 03/11/2017 CLINICAL DATA:  Fall with back pain.  History of breast  cancer. EXAM: CT THORACIC AND LUMBAR SPINE WITHOUT CONTRAST TECHNIQUE: Multidetector CT imaging of the thoracic and lumbar spine was performed without contrast. Multiplanar CT image reconstructions were also generated. COMPARISON:  Lumbar spine radiograph 03/08/2017 Thoracic spine CT 11/14/2015 Lumbar spine MRI 07/20/2015 FINDINGS: CT THORACIC SPINE FINDINGS Alignment: Normal Vertebrae: There are compression deformities of the T9 and T12 vertebrae, both of which are chronic and unchanged compared to 11/14/2015. Paraspinal and other soft tissues: Aortic atherosclerosis. Otherwise unremarkable. Disc levels: No bony spinal canal stenosis. Intervertebral disc spaces are maintained. No evidence of traumatic herniation. CT LUMBAR SPINE FINDINGS Segmentation: Normal Alignment: Normal Vertebrae: Compression deformity of the L3 vertebral body is unchanged compared to MRI of 07/20/2015. L2 compression deformity is new compared to this MR and the CT of 11/14/2015, but unchanged compared to the recent radiograph of 03/08/2017. There is no adjacent hematoma or soft tissue abnormality. There is gas in the disc space and underlying the endplate. Paraspinal and other soft tissues: Aortic atherosclerosis. Disc levels: No evidence of traumatic disc herniation. No bony spinal canal stenosis. There is bilateral L4-L5 and L5-S1 neural foraminal narrowing, worst at right L4-5. IMPRESSION: 1. L2 superior endplate compression fracture is age indeterminate. Given the presence of an intravertebral vacuum cleft, this is favored to be a subacute fracture (within 2 weeks). Recommend correlation with history of trauma). 2. Chronic compression deformities at T9, T12 and L3. 3. No bony spinal canal stenosis or evidence of traumatic disc herniation. 4. Aortic atherosclerosis. Electronically Signed   By: Ulyses Jarred M.D.   On: 03/11/2017 21:29   Ct Lumbar Spine Wo Contrast  Result Date: 03/11/2017 CLINICAL DATA:  Fall with back pain.  History  of breast cancer. EXAM: CT THORACIC AND LUMBAR SPINE WITHOUT CONTRAST TECHNIQUE: Multidetector CT imaging of the thoracic and lumbar spine was performed without contrast. Multiplanar CT image reconstructions were also generated. COMPARISON:  Lumbar spine radiograph 03/08/2017 Thoracic spine CT 11/14/2015 Lumbar spine MRI 07/20/2015 FINDINGS: CT THORACIC SPINE FINDINGS Alignment: Normal Vertebrae: There are compression deformities of the T9 and T12 vertebrae, both of which are chronic and unchanged compared to 11/14/2015. Paraspinal and other soft tissues: Aortic atherosclerosis. Otherwise unremarkable. Disc levels: No bony spinal canal stenosis. Intervertebral disc spaces are maintained. No evidence of traumatic herniation. CT LUMBAR SPINE FINDINGS Segmentation: Normal Alignment: Normal Vertebrae: Compression deformity of the L3 vertebral body is unchanged compared to MRI of 07/20/2015. L2 compression deformity is new compared to this MR and the CT of 11/14/2015, but unchanged compared to the recent radiograph of 03/08/2017. There is no adjacent hematoma or soft tissue abnormality. There is gas in the disc space and underlying the endplate. Paraspinal and other soft tissues: Aortic atherosclerosis. Disc levels: No evidence of traumatic disc herniation. No bony spinal canal stenosis. There is bilateral L4-L5 and L5-S1 neural foraminal narrowing, worst at right L4-5. IMPRESSION: 1. L2  superior endplate compression fracture is age indeterminate. Given the presence of an intravertebral vacuum cleft, this is favored to be a subacute fracture (within 2 weeks). Recommend correlation with history of trauma). 2. Chronic compression deformities at T9, T12 and L3. 3. No bony spinal canal stenosis or evidence of traumatic disc herniation. 4. Aortic atherosclerosis. Electronically Signed   By: Ulyses Jarred M.D.   On: 03/11/2017 21:29     Not all labs, radiology exams or other studies done during hospitalization come through  on my EPIC note; however they are reviewed by me.    Assessment and   L2 SUBACUTE COMPRESSION FRACTURE- as etiology pt on chronic prednisone;pain control SNF - admitted for OT/PT  CITROBACTER UTI - tx with reocepni then keflex SNF - cont keflex 250 mg BID for 4 more days  POLYMYALGIA RHEUMATICA SNF - cont prednisone 1 mg daily  AKI - Cr was 1.22>>1.03; baseline is 0.9  SNF - f/u BMP  HISTORY OF PE/SUPRATHERAPUTIC INR - pt with massive PE 2011, INR 4.6, coumadin held and INR 2.81 on d/c  SNF - resume coumadin 5/17 and recheck INR; will be activel titrating coumadin  HTN SNF - controlled ; cont hydralzine 25 mg q8, norvasc 10 mg daily and cozaar 50 mg daily  DEMENTIA SNF - chronic and stable;cont aricept 10 mg daily  INTERTRIGENOUS YEAST SNF - gluteal fold, GU and under breat; have started diflucan 100 mg daily for 10 days    Time spent > 45 min;> 50% of time with patient was spent reviewing records, labs, tests and studies, counseling and developing plan of care  Webb Silversmith D. [name], MD

## 2017-03-19 ENCOUNTER — Encounter: Payer: Self-pay | Admitting: Internal Medicine

## 2017-03-19 DIAGNOSIS — Z86711 Personal history of pulmonary embolism: Secondary | ICD-10-CM

## 2017-03-19 DIAGNOSIS — A498 Other bacterial infections of unspecified site: Secondary | ICD-10-CM | POA: Insufficient documentation

## 2017-03-19 DIAGNOSIS — B372 Candidiasis of skin and nail: Secondary | ICD-10-CM | POA: Insufficient documentation

## 2017-03-19 DIAGNOSIS — N179 Acute kidney failure, unspecified: Secondary | ICD-10-CM | POA: Insufficient documentation

## 2017-03-19 DIAGNOSIS — R791 Abnormal coagulation profile: Secondary | ICD-10-CM | POA: Insufficient documentation

## 2017-03-19 HISTORY — DX: Personal history of pulmonary embolism: Z86.711

## 2017-03-21 LAB — BASIC METABOLIC PANEL
BUN: 23 mg/dL — AB (ref 4–21)
CREATININE: 1 mg/dL (ref 0.5–1.1)
Glucose: 83 mg/dL
POTASSIUM: 4.5 mmol/L (ref 3.4–5.3)
SODIUM: 137 mmol/L (ref 137–147)

## 2017-03-21 LAB — CBC AND DIFFERENTIAL
HCT: 34 % — AB (ref 36–46)
HEMOGLOBIN: 10.2 g/dL — AB (ref 12.0–16.0)
Platelets: 309 10*3/uL (ref 150–399)
WBC: 9 10*3/mL

## 2017-04-15 ENCOUNTER — Encounter: Payer: Self-pay | Admitting: Internal Medicine

## 2017-04-15 ENCOUNTER — Non-Acute Institutional Stay (SKILLED_NURSING_FACILITY): Payer: Medicare Other | Admitting: Internal Medicine

## 2017-04-15 DIAGNOSIS — I2510 Atherosclerotic heart disease of native coronary artery without angina pectoris: Secondary | ICD-10-CM | POA: Diagnosis not present

## 2017-04-15 DIAGNOSIS — I8290 Acute embolism and thrombosis of unspecified vein: Secondary | ICD-10-CM | POA: Diagnosis not present

## 2017-04-15 DIAGNOSIS — I10 Essential (primary) hypertension: Secondary | ICD-10-CM | POA: Diagnosis not present

## 2017-04-15 NOTE — Progress Notes (Signed)
Location:  [facility] Room Number: 208D Place of Service:  SNF [phone])   [name] [name]. [name], MD  Patient Care Team: Deland Pretty, MD as PCP - General  Extended Emergency Contact Information Primary Emergency Contact: Tal,[name] Address: [place], [place] [address] [name] of [place] Phone: [phone] Mobile Phone: [phone] Relation: Son Secondary Emergency Contact: [place] of [place] Phone: [phone] Relation: Daughter    Allergies: Ambien [zolpidem tartrate]; Codeine phosphate; Darvocet [propoxyphene n-acetaminophen]; Promethazine hcl; Sulfamethoxazole; Vicodin [hydrocodone-acetaminophen]; Amoxicillin; and Penicillins  Chief Complaint  Patient presents with  . Medical Management of Chronic Issues    Routine Visit    HPI: Patient is 83 y.o. female who is being seen for routine issues of CAD, HTN, h/o PE/DVT.  Past Medical History:  Diagnosis Date  . Anxiety   . Axillary abscess   . CAD (coronary artery disease)    S/p PTCA / stenting (last cath 2004, multiple LAD stents, 2 stents in the right coronary artery all patent)  . Cancer ([place])   . Complication of anesthesia   . Dementia   . GERD (gastroesophageal reflux disease)   . HTN (hypertension)   . PMR (polymyalgia rheumatica) (HCC)   . PONV (postoperative nausea and vomiting)   . Restless leg syndrome   . 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 Started coumadin    Past Surgical History:  Procedure Laterality Date  . BREAST LUMPECTOMY    . COLECTOMY    . ESOPHAGOGASTRODUODENOSCOPY (EGD) WITH ESOPHAGEAL DILATION  10/10/2012   Procedure: ESOPHAGOGASTRODUODENOSCOPY (EGD) WITH ESOPHAGEAL DILATION;  Surgeon: Inda Castle, MD;  Location: [facility];  Service: Endoscopy;  Laterality: N/A;  . heart stents  x 8  . INCISION AND DRAINAGE     bilateral axillary, non specific staff  . INCISION AND  DRAINAGE ABSCESS  09/28/2012   Procedure: INCISION AND DRAINAGE ABSCESS;  Surgeon: Zenovia Jarred, MD;  Location: [facility];  Service: General;  Laterality: Bilateral;  . stent     cardiac x 8 stents.    Allergies as of 04/15/2017      Reactions   Ambien [zolpidem Tartrate] Nausea And Vomiting   Codeine Phosphate Nausea And Vomiting   Darvocet [propoxyphene N-acetaminophen] Nausea And Vomiting   Promethazine Hcl Nausea And Vomiting   Sulfamethoxazole Other (See Comments)   Pt doesn't remember reaction   Vicodin [hydrocodone-acetaminophen] Other (See Comments)   Unknown reaction   Amoxicillin Nausea And Vomiting, Rash   Penicillins Nausea And Vomiting, Rash      Medication List       Accurate as of 04/15/17 11:59 PM. Always use your most recent med list.          acetaminophen 500 MG tablet Commonly known as:  TYLENOL Take 500 mg by mouth 3 (three) times daily as needed for moderate pain (NOT MORE THAN 3,000 MG IN 24 HOUR PERIOD). For pain   ALIVE WOMENS GUMMY PO Take 1 tablet by mouth daily.   amLODipine 10 MG tablet Commonly known as:  NORVASC Take 10 mg by mouth every morning.   aspirin EC 81 MG tablet Take 1 tablet (81 mg total) by mouth daily.   atorvastatin 10 MG tablet Commonly known as:  LIPITOR Take 10 mg by mouth every evening.   cholecalciferol 1000 units tablet Commonly known as:  VITAMIN D Take 2,000 Units by  mouth daily with lunch.   cholestyramine light 4 g packet Commonly known as:  PREVALITE Take 4 g by mouth daily as needed (for loose bowels).   donepezil 10 MG tablet Commonly known as:  ARICEPT Take 10 mg by mouth every morning.   fluticasone 50 MCG/ACT nasal spray Commonly known as:  FLONASE Place 1 spray into both nostrils daily as needed for allergies or rhinitis.   hydrALAZINE 25 MG tablet Commonly known as:  APRESOLINE Take 1 tablet (25 mg total) by mouth every 8 (eight) hours.   loratadine 10 MG tablet Commonly known as:   CLARITIN Take 10 mg by mouth daily as needed for allergies. Reported on 05/03/2016   losartan 50 MG tablet Commonly known as:  COZAAR Take 50 mg by mouth daily.   nitroGLYCERIN 0.4 MG SL tablet Commonly known as:  NITROSTAT Place 0.4 mg under the tongue every 5 (five) minutes as needed for chest pain.   nystatin powder Generic drug:  nystatin Apply 1 g topically daily as needed (irrittation).   oxybutynin 5 MG tablet Commonly known as:  DITROPAN Take 5 mg by mouth 2 (two) times daily.   pantoprazole 40 MG tablet Commonly known as:  PROTONIX Take 40 mg by mouth 2 (two) times daily as needed (for heartburn).   potassium chloride SA 20 MEQ tablet Commonly known as:  K-DUR,KLOR-CON Take 20 mEq by mouth daily with lunch.   predniSONE 1 MG tablet Commonly known as:  DELTASONE Take 1 mg by mouth daily with breakfast.   rOPINIRole 1 MG tablet Commonly known as:  REQUIP Take 1 mg by mouth 3 (three) times daily. 1mg  at noon, 1mg  2 hours before bed, then 1mg  at bedtime   sertraline 25 MG tablet Commonly known as:  ZOLOFT Take 25 mg by mouth every morning.   SILENOR 6 MG Tabs Generic drug:  Doxepin HCl Take 6 mg by mouth at bedtime.   traMADol 50 MG tablet Commonly known as:  ULTRAM Take 0.5 tablets (25 mg total) by mouth 3 (three) times daily as needed for moderate pain or severe pain.   warfarin 3 MG tablet Commonly known as:  COUMADIN Take 3 mg by mouth daily.       Meds ordered this encounter  Medications  . warfarin (COUMADIN) 3 MG tablet    Sig: Take 3 mg by mouth daily.  . Multiple Vitamins-Minerals (ALIVE WOMENS GUMMY PO)    Sig: Take 1 tablet by mouth daily.  Marland Kitchen atorvastatin (LIPITOR) 10 MG tablet    Sig: Take 10 mg by mouth every evening.  Marland Kitchen losartan (COZAAR) 50 MG tablet    Sig: Take 50 mg by mouth daily.    Immunization History  Administered Date(s) Administered  . PPD Test 03/18/2017    Social History  Substance Use Topics  . Smoking status:  Never Smoker  . Smokeless tobacco: Never Used  . Alcohol use No    Review of Systems  DATA OBTAINED: from patient, nurse GENERAL:  no fevers, fatigue, appetite changes SKIN: No itching, rash HEENT: No complaint RESPIRATORY: No cough, wheezing, SOB CARDIAC: No chest pain, palpitations, lower extremity edema  GI: No abdominal pain, No N/V/D or constipation, No heartburn or reflux  GU: No dysuria, frequency or urgency, or incontinence  MUSCULOSKELETAL: No unrelieved bone/joint pain NEUROLOGIC: No headache, dizziness  PSYCHIATRIC: No overt anxiety or sadness  Vitals:   04/15/17 1124  BP: (!) 144/63  Pulse: 66  Resp: 20  Temp: 97.3 F (36.3  C)   Body mass index is 26.6 kg/m. Physical Exam  GENERAL APPEARANCE: Alert, conversant, No acute distress  SKIN: No diaphoresis rash HEENT: Unremarkable RESPIRATORY: Breathing is even, unlabored. Lung sounds are clear   CARDIOVASCULAR: Heart RRR no murmurs, rubs or gallops. No peripheral edema  GASTROINTESTINAL: Abdomen is soft, non-tender, not distended w/ normal bowel sounds.  GENITOURINARY: Bladder non tender, not distended  MUSCULOSKELETAL: No abnormal joints or musculature NEUROLOGIC: Cranial nerves 2-12 grossly intact. Moves all extremities PSYCHIATRIC: Mood and affect with dementia, no behavioral issues  Patient Active Problem List   Diagnosis Date Noted  . Citrobacter infection 03/19/2017  . AKI (acute kidney injury) ([place]) 03/19/2017  . History of pulmonary embolus (PE) 03/19/2017  . Elevated INR 03/19/2017  . Candidal intertrigo 03/19/2017  . Compression fracture of L2 ([place]) 03/12/2017  . Acute lower UTI 03/12/2017  . Accelerated hypertension 03/12/2017  . Leukocytosis 07/31/2015  . Acute bronchitis 07/28/2015  . Chronic anticoagulation 07/28/2015  . Intractable back pain 07/20/2015  . Back pain 07/20/2015  . Cellulitis and abscess of left buttock 04/12/2013  . Stricture and stenosis of esophagus 10/10/2012  .  Dementia 10/06/2012  . Dysphagia 10/06/2012  . Axillary abscess - bilateral, multiple 09/27/2012  . Hypotension 08/24/2012  . Near syncope 08/24/2012  . Abscess of axilla, left 06/27/2012  . Bradycardia 06/13/2012  . Abscess 06/11/2012  . Chest pain 06/09/2012  . Cellulitis 06/09/2012  . Weakness of both legs 06/09/2012  . PMR (polymyalgia rheumatica) (HCC)   . Restless leg syndrome   . Angina pectoris, unstable ([place]) 03/02/2012  . Anemia 03/02/2012  . Dyspnea 02/03/2012  . UTI (urinary tract infection) 02/03/2012  . CAD (coronary artery disease)   . HTN (hypertension)   . VTE (venous thromboembolism) 10/01/2010  . HYPERCHOLESTEROLEMIA  IIA 08/27/2009  . Hyperlipidemia 12/11/2008  . HYPERTENSION, BENIGN 12/11/2008    CMP     Component Value Date/Time   NA 137 03/21/2017   K 4.5 03/21/2017   CL 111 03/16/2017 0400   CO2 21 (L) 03/16/2017 0400   GLUCOSE 119 (H) 03/16/2017 0400   BUN 23 (A) 03/21/2017   CREATININE 1.0 03/21/2017   CREATININE 1.03 (H) 03/16/2017 0400   CALCIUM 8.7 (L) 03/16/2017 0400   PROT 6.7 03/11/2017 2031   ALBUMIN 3.4 (L) 03/11/2017 2031   AST 17 03/11/2017 2031   ALT 14 03/11/2017 2031   ALKPHOS 60 03/11/2017 2031   BILITOT 0.5 03/11/2017 2031   GFRNONAA 45 (L) 03/16/2017 0400   GFRAA 53 (L) 03/16/2017 0400    Recent Labs  03/14/17 0351  03/15/17 0402 03/16/17 03/16/17 0400 03/21/17  NA 137  < > 137 139 139 137  K 4.1  < > 4.1  --  4.0 4.5  CL 107  --  110  --  111  --   CO2 20*  --  19*  --  21*  --   GLUCOSE 98  --  100*  --  119*  --   BUN 16  < > 18 17 17  23*  CREATININE 1.22*  < > 1.15* 1.0 1.03* 1.0  CALCIUM 8.3*  --  8.4*  --  8.7*  --   < > = values in this interval not displayed.  Recent Labs  03/11/17 03/11/17 2031  AST 17 17  ALT 14 14  ALKPHOS 60 60  BILITOT  --  0.5  PROT  --  6.7  ALBUMIN  --  3.4*    Recent  Labs  03/11/17 2031  03/12/17 0437  03/13/17 0255 03/15/17 03/15/17 0402 03/21/17  WBC 11.2*  < >  9.5  < > 10.1 7.7 7.7 9.0  NEUTROABS 8.2*  --   --   --  6.5  --  4.2  --   HGB 12.0  < > 10.8*  < > 12.7 10.3* 10.3* 10.2*  HCT 36.6  < > 33.0*  < > 38.6 32* 32.1* 34*  MCV 84.7  --  84.4  --  84.5  --  85.4  --   PLT 276  < > 264  < > 299 29* 290 309  < > = values in this interval not displayed. No results for input(s): CHOL, LDLCALC, TRIG in the last 8760 hours.  Invalid input(s): HCL No results found for: MICROALBUR No results found for: TSH No results found for: HGBA1C Lab Results  Component Value Date   CHOL 165 03/03/2012   HDL 68 03/03/2012   LDLCALC 79 03/03/2012   TRIG 90 03/03/2012   CHOLHDL 2.4 03/03/2012    Significant Diagnostic Results in last 30 days:  No results found.  Assessment and Plan  CAD (coronary artery disease) No recent reported CP; plan to cont ASA 81 mg daily and statin; with prn NTG SL  HTN (hypertension) Controlled for age; plan to cont norvasc 10 mg daily, hydralazine 25 mg TID and cozaar 50 mg daily  VTE (venous thromboembolism) For prior PE and DVT; plan cont coumadin 3 mg daily but actively titrated    [name] Silversmith D. [name], MD

## 2017-04-17 ENCOUNTER — Encounter: Payer: Self-pay | Admitting: Internal Medicine

## 2017-04-17 NOTE — Assessment & Plan Note (Signed)
For prior PE and DVT; plan cont coumadin 3 mg daily but actively titrated

## 2017-04-17 NOTE — Assessment & Plan Note (Signed)
Controlled for age; plan to cont norvasc 10 mg daily, hydralazine 25 mg TID and cozaar 50 mg daily

## 2017-04-17 NOTE — Assessment & Plan Note (Addendum)
No recent reported CP; plan to cont ASA 81 mg daily and statin; with prn NTG SL

## 2017-05-17 ENCOUNTER — Non-Acute Institutional Stay (SKILLED_NURSING_FACILITY): Payer: Medicare Other | Admitting: Internal Medicine

## 2017-05-17 DIAGNOSIS — Z7901 Long term (current) use of anticoagulants: Secondary | ICD-10-CM | POA: Diagnosis not present

## 2017-05-17 DIAGNOSIS — E785 Hyperlipidemia, unspecified: Secondary | ICD-10-CM

## 2017-05-17 DIAGNOSIS — F419 Anxiety disorder, unspecified: Secondary | ICD-10-CM

## 2017-05-17 NOTE — Progress Notes (Signed)
Location:  [facility] Room Number: 208D Place of Service:  SNF [phone])  Provider: [name], MD  Deland Pretty, MD  Patient Care Team: Deland Pretty, MD as PCP - General  Extended Emergency Contact Information Primary Emergency Contact: Marker,[name] Address: [place], [place] [address] [name] of [place] Phone: [phone] Mobile Phone: [phone] Relation: Son Secondary Emergency Contact: [place] of [place] Phone: [phone] Relation: Daughter    Allergies: Ambien [zolpidem tartrate]; Codeine phosphate; Darvocet [propoxyphene n-acetaminophen]; Promethazine hcl; Sulfamethoxazole; Vicodin [hydrocodone-acetaminophen]; Amoxicillin; and Penicillins  Chief Complaint  Patient presents with  . Medical Management of Chronic Issues    routine visit    HPI: Patient is 83 y.o. female who Is being seen for routine issues of anxiety, chronic anticoagulation therapy, and hyperlipidemia.  Past Medical History:  Diagnosis Date  . Anemia 03/02/2012  . Angina pectoris, unstable ([place]) 03/02/2012  . Anxiety   . Axillary abscess   . Bradycardia 06/13/2012  . CAD (coronary artery disease)    S/p PTCA / stenting (last cath 2004, multiple LAD stents, 2 stents in the right coronary artery all patent)  . Cancer ([facility])   . Complication of anesthesia   . Compression fracture of L2 ([place]) 03/12/2017  . Dementia   . Dysphagia 10/06/2012  . Dyspnea 02/03/2012  . GERD (gastroesophageal reflux disease)   . History of pulmonary embolus (PE) 03/19/2017  . HTN (hypertension)   . PMR (polymyalgia rheumatica) (HCC)   . PONV (postoperative nausea and vomiting)   . Restless leg syndrome   . 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 Started coumadin  . Weakness of both legs 06/09/2012    Past Surgical History:  Procedure Laterality Date  . BREAST LUMPECTOMY    . COLECTOMY    .  ESOPHAGOGASTRODUODENOSCOPY (EGD) WITH ESOPHAGEAL DILATION  10/10/2012   Procedure: ESOPHAGOGASTRODUODENOSCOPY (EGD) WITH ESOPHAGEAL DILATION;  Surgeon: Inda Castle, MD;  Location: [facility];  Service: Endoscopy;  Laterality: N/A;  . heart stents  x 8  . INCISION AND DRAINAGE     bilateral axillary, non specific staff  . INCISION AND DRAINAGE ABSCESS  09/28/2012   Procedure: INCISION AND DRAINAGE ABSCESS;  Surgeon: Zenovia Jarred, MD;  Location: [facility];  Service: General;  Laterality: Bilateral;  . stent     cardiac x 8 stents.    Allergies as of 05/17/2017      Reactions   Ambien [zolpidem Tartrate] Nausea And Vomiting   Codeine Phosphate Nausea And Vomiting   Darvocet [propoxyphene N-acetaminophen] Nausea And Vomiting   Promethazine Hcl Nausea And Vomiting   Sulfamethoxazole Other (See Comments)   Pt doesn't remember reaction   Vicodin [hydrocodone-acetaminophen] Other (See Comments)   Unknown reaction   Amoxicillin Nausea And Vomiting, Rash   Penicillins Nausea And Vomiting, Rash      Medication List       Accurate as of 05/17/17 11:59 PM. Always use your most recent med list.          acetaminophen 500 MG tablet Commonly known as:  TYLENOL Take 500 mg by mouth 3 (three) times daily as needed for moderate pain (NOT MORE THAN 3,000 MG IN 24 HOUR PERIOD). For pain   ALIVE WOMENS GUMMY PO Take 1 tablet by mouth daily.   amLODipine 10 MG tablet Commonly known as:  NORVASC Take 10 mg by mouth  every morning.   aspirin EC 81 MG tablet Take 1 tablet (81 mg total) by mouth daily.   atorvastatin 10 MG tablet Commonly known as:  LIPITOR Take 10 mg by mouth every evening.   cholecalciferol 1000 units tablet Commonly known as:  VITAMIN D Take 2,000 Units by mouth daily with lunch.   cholestyramine light 4 g packet Commonly known as:  PREVALITE Take 4 g by mouth daily as needed (for loose bowels).   donepezil 10 MG tablet Commonly known as:  ARICEPT Take 10 mg  by mouth every morning.   fluticasone 50 MCG/ACT nasal spray Commonly known as:  FLONASE Place 1 spray into both nostrils daily as needed for allergies or rhinitis.   hydrALAZINE 25 MG tablet Commonly known as:  APRESOLINE Take 1 tablet (25 mg total) by mouth every 8 (eight) hours.   loratadine 10 MG tablet Commonly known as:  CLARITIN Take 10 mg by mouth daily as needed for allergies. Reported on 05/03/2016   losartan 50 MG tablet Commonly known as:  COZAAR Take 50 mg by mouth daily.   nitroGLYCERIN 0.4 MG SL tablet Commonly known as:  NITROSTAT Place 0.4 mg under the tongue every 5 (five) minutes as needed for chest pain.   nystatin powder Generic drug:  nystatin Apply 1 g topically daily as needed (irrittation).   oxybutynin 5 MG tablet Commonly known as:  DITROPAN Take 5 mg by mouth 2 (two) times daily.   pantoprazole 40 MG tablet Commonly known as:  PROTONIX Take 40 mg by mouth 2 (two) times daily as needed (for heartburn).   potassium chloride SA 20 MEQ tablet Commonly known as:  K-DUR,KLOR-CON Take 20 mEq by mouth daily with lunch.   predniSONE 1 MG tablet Commonly known as:  DELTASONE Take 1 mg by mouth daily with breakfast.   rOPINIRole 1 MG tablet Commonly known as:  REQUIP Take 1 mg by mouth 3 (three) times daily. 1mg  at noon, 1mg  2 hours before bed, then 1mg  at bedtime   sertraline 25 MG tablet Commonly known as:  ZOLOFT Take 25 mg by mouth every morning.   SILENOR 6 MG Tabs Generic drug:  Doxepin HCl Take 6 mg by mouth at bedtime.   traMADol 50 MG tablet Commonly known as:  ULTRAM Take 0.5 tablets (25 mg total) by mouth 3 (three) times daily as needed for moderate pain or severe pain.   warfarin 2.5 MG tablet Commonly known as:  COUMADIN Take 2 mg by mouth. Take one tablet daily       Meds ordered this encounter  Medications  . DISCONTD: warfarin (COUMADIN) 2.5 MG tablet    Sig: Take 2 mg by mouth. Take one tablet daily      Immunization History  Administered Date(s) Administered  . Influenza-Unspecified 08/01/2016  . PPD Test 03/18/2017    Social History  Substance Use Topics  . Smoking status: Never Smoker  . Smokeless tobacco: Never Used  . Alcohol use No    Review of Systems  DATA OBTAINED: from patient, nurse GENERAL:  no fevers, fatigue, appetite changes SKIN: No itching, rash HEENT: No complaint RESPIRATORY: No cough, wheezing, SOB CARDIAC: No chest pain, palpitations, lower extremity edema  GI: No abdominal pain, No N/V/D or constipation, No heartburn or reflux  GU: No dysuria, frequency or urgency, or incontinence  MUSCULOSKELETAL: No unrelieved bone/joint pain NEUROLOGIC: No headache, dizziness  PSYCHIATRIC: No overt anxiety or sadness  Vitals:   05/17/17 0933  BP: 135/80  Pulse: 69  Resp: 16  Temp: (!) 97 F (36.1 C)   Body mass index is 26.6 kg/m. Physical Exam  GENERAL APPEARANCE: Alert, conversant, No acute distress  SKIN: No diaphoresis rash HEENT: Unremarkable RESPIRATORY: Breathing is even, unlabored. Lung sounds are clear   CARDIOVASCULAR: Heart RRR no murmurs, rubs or gallops. No peripheral edema  GASTROINTESTINAL: Abdomen is soft, non-tender, not distended w/ normal bowel sounds.  GENITOURINARY: Bladder non tender, not distended  MUSCULOSKELETAL: No abnormal joints or musculature NEUROLOGIC: Cranial nerves 2-12 grossly intact. Moves all extremities PSYCHIATRIC: Mood and affect With dementia, no behavioral issues  Patient Active Problem List   Diagnosis Date Noted  . Anxiety 06/16/2017  . Citrobacter infection 03/19/2017  . AKI (acute kidney injury) ([facility]) 03/19/2017  . History of pulmonary embolus (PE) 03/19/2017  . Elevated INR 03/19/2017  . Candidal intertrigo 03/19/2017  . Compression fracture of L2 ([place]) 03/12/2017  . Acute lower UTI 03/12/2017  . Accelerated hypertension 03/12/2017  . Leukocytosis 07/31/2015  . Acute bronchitis 07/28/2015  .  Chronic anticoagulation 07/28/2015  . Intractable back pain 07/20/2015  . Back pain 07/20/2015  . Cellulitis and abscess of left buttock 04/12/2013  . Stricture and stenosis of esophagus 10/10/2012  . Dementia 10/06/2012  . Dysphagia 10/06/2012  . Axillary abscess - bilateral, multiple 09/27/2012  . Hypotension 08/24/2012  . Near syncope 08/24/2012  . Abscess of axilla, left 06/27/2012  . Bradycardia 06/13/2012  . Abscess 06/11/2012  . Chest pain 06/09/2012  . Cellulitis 06/09/2012  . Weakness of both legs 06/09/2012  . PMR (polymyalgia rheumatica) (HCC)   . Restless leg syndrome   . Angina pectoris, unstable ([place]) 03/02/2012  . Anemia 03/02/2012  . Dyspnea 02/03/2012  . UTI (urinary tract infection) 02/03/2012  . CAD (coronary artery disease)   . HTN (hypertension)   . VTE (venous thromboembolism) 10/01/2010  . HYPERCHOLESTEROLEMIA  IIA 08/27/2009  . Hyperlipidemia 12/11/2008  . HYPERTENSION, BENIGN 12/11/2008    CMP     Component Value Date/Time   NA 137 03/21/2017   K 4.5 03/21/2017   CL 111 03/16/2017 0400   CO2 21 (L) 03/16/2017 0400   GLUCOSE 119 (H) 03/16/2017 0400   BUN 23 (A) 03/21/2017   CREATININE 1.0 03/21/2017   CREATININE 1.03 (H) 03/16/2017 0400   CALCIUM 8.7 (L) 03/16/2017 0400   PROT 6.7 03/11/2017 2031   ALBUMIN 3.4 (L) 03/11/2017 2031   AST 17 03/11/2017 2031   ALT 14 03/11/2017 2031   ALKPHOS 60 03/11/2017 2031   BILITOT 0.5 03/11/2017 2031   GFRNONAA 45 (L) 03/16/2017 0400   GFRAA 53 (L) 03/16/2017 0400    Recent Labs  03/14/17 0351  03/15/17 0402 03/16/17 03/16/17 0400 03/21/17  NA 137  < > 137 139 139 137  K 4.1  < > 4.1  --  4.0 4.5  CL 107  --  110  --  111  --   CO2 20*  --  19*  --  21*  --   GLUCOSE 98  --  100*  --  119*  --   BUN 16  < > 18 17 17  23*  CREATININE 1.22*  < > 1.15* 1.0 1.03* 1.0  CALCIUM 8.3*  --  8.4*  --  8.7*  --   < > = values in this interval not displayed.  Recent Labs  03/11/17 03/11/17 2031  AST  17 17  ALT 14 14  ALKPHOS 60 60  BILITOT  --  0.5  PROT  --  6.7  ALBUMIN  --  3.4*    Recent Labs  03/11/17 2031  03/12/17 0437  03/13/17 0255 03/15/17 03/15/17 0402 03/21/17  WBC 11.2*  < > 9.5  < > 10.1 7.7 7.7 9.0  NEUTROABS 8.2*  --   --   --  6.5  --  4.2  --   HGB 12.0  < > 10.8*  < > 12.7 10.3* 10.3* 10.2*  HCT 36.6  < > 33.0*  < > 38.6 32* 32.1* 34*  MCV 84.7  --  84.4  --  84.5  --  85.4  --   PLT 276  < > 264  < > 299 29* 290 309  < > = values in this interval not displayed. No results for input(s): CHOL, LDLCALC, TRIG in the last 8760 hours.  Invalid input(s): HCL No results found for: MICROALBUR No results found for: TSH No results found for: HGBA1C Lab Results  Component Value Date   CHOL 165 03/03/2012   HDL 68 03/03/2012   LDLCALC 79 03/03/2012   TRIG 90 03/03/2012   CHOLHDL 2.4 03/03/2012    Significant Diagnostic Results in last 30 days:  No results found.  Assessment and Plan  Anxiety Appears stable; continue silanor 6 mg by mouth daily at bedtime  Chronic anticoagulation On warfarin for chronic PE; is actively titrated weekly  Hyperlipidemia No recent fasting lipid panel; continue Lipitor 10 mg by mouth daily and obtain new    Amiracle Neises D. [name], MD

## 2017-06-08 ENCOUNTER — Non-Acute Institutional Stay (SKILLED_NURSING_FACILITY): Payer: Medicare Other | Admitting: Internal Medicine

## 2017-06-08 ENCOUNTER — Encounter: Payer: Self-pay | Admitting: Internal Medicine

## 2017-06-08 DIAGNOSIS — R05 Cough: Secondary | ICD-10-CM

## 2017-06-08 DIAGNOSIS — J189 Pneumonia, unspecified organism: Secondary | ICD-10-CM | POA: Diagnosis not present

## 2017-06-08 DIAGNOSIS — R4702 Dysphasia: Secondary | ICD-10-CM

## 2017-06-08 DIAGNOSIS — R059 Cough, unspecified: Secondary | ICD-10-CM

## 2017-06-08 DIAGNOSIS — R062 Wheezing: Secondary | ICD-10-CM | POA: Diagnosis not present

## 2017-06-08 NOTE — Progress Notes (Signed)
Location:  [facility] Room Number: 208D Place of Service:  SNF [phone])  Provider: [name], MD  Deland Pretty, MD  Patient Care Team: Deland Pretty, MD as PCP - General  Extended Emergency Contact Information Primary Emergency Contact: Garza,[name] Address: [place], [place] [address] [name] of [place] Phone: [phone] Mobile Phone: [phone] Relation: Son Secondary Emergency Contact: [place] of [place] Phone: [phone] Relation: Daughter    Allergies: Ambien [zolpidem tartrate]; Codeine phosphate; Darvocet [propoxyphene n-acetaminophen]; Promethazine hcl; Sulfamethoxazole; Vicodin [hydrocodone-acetaminophen]; Amoxicillin; and Penicillins  Chief Complaint  Patient presents with  . Acute Visit    HPI: Patient is 83 y.o. female who  nursing asked me to see for cough and wheezing noted this morning. Nursing has already given patient an albuterol neb treatment. She thinks the treatment helped, she is worse when she is up and walking. Patient was reported to have fever 100 today. Patient denies chest pain or shortness of breath and admitted she had a little cough yesterday. Patient's son is with her and confirms that her mental status is normal for her.  Past Medical History:  Diagnosis Date  . Anemia 03/02/2012  . Angina pectoris, unstable ([place]) 03/02/2012  . Anxiety   . Axillary abscess   . Bradycardia 06/13/2012  . CAD (coronary artery disease)    S/p PTCA / stenting (last cath 2004, multiple LAD stents, 2 stents in the right coronary artery all patent)  . Cancer ([facility])   . Complication of anesthesia   . Compression fracture of L2 ([facility]) 03/12/2017  . Dementia   . Dysphagia 10/06/2012  . Dyspnea 02/03/2012  . GERD (gastroesophageal reflux disease)   . History of pulmonary embolus (PE) 03/19/2017  . HTN (hypertension)   . PMR (polymyalgia rheumatica) (HCC)   . PONV  (postoperative nausea and vomiting)   . Restless leg syndrome   . 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 Started coumadin  . Weakness of both legs 06/09/2012    Past Surgical History:  Procedure Laterality Date  . BREAST LUMPECTOMY    . COLECTOMY    . ESOPHAGOGASTRODUODENOSCOPY (EGD) WITH ESOPHAGEAL DILATION  10/10/2012   Procedure: ESOPHAGOGASTRODUODENOSCOPY (EGD) WITH ESOPHAGEAL DILATION;  Surgeon: Inda Castle, MD;  Location: [facility];  Service: Endoscopy;  Laterality: N/A;  . heart stents  x 8  . INCISION AND DRAINAGE     bilateral axillary, non specific staff  . INCISION AND DRAINAGE ABSCESS  09/28/2012   Procedure: INCISION AND DRAINAGE ABSCESS;  Surgeon: Zenovia Jarred, MD;  Location: [facility];  Service: General;  Laterality: Bilateral;  . stent     cardiac x 8 stents.    Allergies as of 06/08/2017      Reactions   Ambien [zolpidem Tartrate] Nausea And Vomiting   Codeine Phosphate Nausea And Vomiting   Darvocet [propoxyphene N-acetaminophen] Nausea And Vomiting   Promethazine Hcl Nausea And Vomiting   Sulfamethoxazole Other (See Comments)   Pt doesn't remember reaction   Vicodin [hydrocodone-acetaminophen] Other (See Comments)   Unknown reaction   Amoxicillin Nausea And Vomiting, Rash   Penicillins Nausea And Vomiting, Rash      Medication List       Accurate as of 06/08/17  2:21 PM. Always use your most recent med list.          acetaminophen 500 MG tablet Commonly known as:  TYLENOL Take 500 mg by mouth 3 (three) times daily as needed for moderate pain (NOT MORE THAN 3,000 MG IN 24 HOUR PERIOD). For pain   ALIVE WOMENS GUMMY PO Take 1 tablet by mouth daily.   amLODipine 10 MG tablet Commonly known as:  NORVASC Take 10 mg by mouth every morning.   aspirin EC 81 MG tablet Take 1 tablet (81 mg total) by mouth daily.   atorvastatin 10 MG tablet Commonly known as:  LIPITOR Take 10 mg by mouth every evening.   cholecalciferol 1000 units  tablet Commonly known as:  VITAMIN D Take 2,000 Units by mouth daily with lunch.   cholestyramine light 4 g packet Commonly known as:  PREVALITE Take 4 g by mouth daily as needed (for loose bowels).   donepezil 10 MG tablet Commonly known as:  ARICEPT Take 10 mg by mouth every morning.   fluticasone 50 MCG/ACT nasal spray Commonly known as:  FLONASE Place 1 spray into both nostrils daily as needed for allergies or rhinitis.   hydrALAZINE 25 MG tablet Commonly known as:  APRESOLINE Take 1 tablet (25 mg total) by mouth every 8 (eight) hours.   loratadine 10 MG tablet Commonly known as:  CLARITIN Take 10 mg by mouth daily as needed for allergies. Reported on 05/03/2016   losartan 50 MG tablet Commonly known as:  COZAAR Take 50 mg by mouth daily.   nitroGLYCERIN 0.4 MG SL tablet Commonly known as:  NITROSTAT Place 0.4 mg under the tongue every 5 (five) minutes as needed for chest pain.   nystatin powder Generic drug:  nystatin Apply 1 g topically daily as needed (irrittation).   oxybutynin 5 MG tablet Commonly known as:  DITROPAN Take 5 mg by mouth 2 (two) times daily.   pantoprazole 40 MG tablet Commonly known as:  PROTONIX Take 40 mg by mouth 2 (two) times daily as needed (for heartburn).   potassium chloride SA 20 MEQ tablet Commonly known as:  K-DUR,KLOR-CON Take 20 mEq by mouth daily with lunch.   predniSONE 1 MG tablet Commonly known as:  DELTASONE Take 1 mg by mouth daily with breakfast.   rOPINIRole 1 MG tablet Commonly known as:  REQUIP Take 1 mg by mouth 3 (three) times daily. 1mg  at noon, 1mg  2 hours before bed, then 1mg  at bedtime   sertraline 25 MG tablet Commonly known as:  ZOLOFT Take 25 mg by mouth every morning.   SILENOR 6 MG Tabs Generic drug:  Doxepin HCl Take 6 mg by mouth at bedtime.   traMADol 50 MG tablet Commonly known as:  ULTRAM Take 0.5 tablets (25 mg total) by mouth 3 (three) times daily as needed for moderate pain or severe  pain.   warfarin 2 MG tablet Commonly known as:  COUMADIN Take 2 mg by mouth. Take one tablet daily       Meds ordered this encounter  Medications  . warfarin (COUMADIN) 2 MG tablet    Sig: Take 2 mg by mouth. Take one tablet daily    Immunization History  Administered Date(s) Administered  . Influenza-Unspecified 08/01/2016  . PPD Test 03/18/2017    Social History  Substance Use Topics  . Smoking status: Never Smoker  . Smokeless tobacco: Never Used  . Alcohol use No    Review of Systems  DATA OBTAINED: from patient, nurse, family member GENERAL:  + low grade fevers, +fatigue, appetite changes SKIN: No itching, rash HEENT: No complaint RESPIRATORY: + cough,+ wheezing, no SOB  CARDIAC: No chest pain, palpitations, lower extremity edema  GI: No abdominal pain, No N/V/D or constipation, No heartburn or reflux  GU: No dysuria, frequency or urgency, or incontinence  MUSCULOSKELETAL: No unrelieved bone/joint pain NEUROLOGIC: No headache, dizziness  PSYCHIATRIC: No overt anxiety or sadness  Vitals:   06/08/17 1411  BP: 135/80  Pulse: (!) 50  Resp: 18  Temp: (!) 97 F (36.1 C)   Body mass index is 26.83 kg/m. Physical Exam  GENERAL APPEARANCE: Alert, conversant, No acute distress ,In bed SKIN: No diaphoresis rash HEENT: Unremarkable RESPIRATORY: Breathing is even, unlabored. Lung sounds are wheezes at bilateral bases with good airflow; O2 sat room air 92-93% at bedside   CARDIOVASCULAR: Heart RRR no murmurs, rubs or gallops. No peripheral edema  GASTROINTESTINAL: Abdomen is soft, non-tender, not distended w/ normal bowel sounds.  GENITOURINARY: Bladder non tender, not distended  MUSCULOSKELETAL: No abnormal joints or musculature NEUROLOGIC: Cranial nerves 2-12 grossly intact. Moves all extremities PSYCHIATRIC: Mood and affect appropriate to situation with dementia, no behavioral issues  Patient Active Problem List   Diagnosis Date Noted  . Citrobacter  infection 03/19/2017  . AKI (acute kidney injury) ([facility] [place]) 03/19/2017  . History of pulmonary embolus (PE) 03/19/2017  . Elevated INR 03/19/2017  . Candidal intertrigo 03/19/2017  . Compression fracture of L2 ([facility]) 03/12/2017  . Acute lower UTI 03/12/2017  . Accelerated hypertension 03/12/2017  . Leukocytosis 07/31/2015  . Acute bronchitis 07/28/2015  . Chronic anticoagulation 07/28/2015  . Intractable back pain 07/20/2015  . Back pain 07/20/2015  . Cellulitis and abscess of left buttock 04/12/2013  . Stricture and stenosis of esophagus 10/10/2012  . Dementia 10/06/2012  . Dysphagia 10/06/2012  . Axillary abscess - bilateral, multiple 09/27/2012  . Hypotension 08/24/2012  . Near syncope 08/24/2012  . Abscess of axilla, left 06/27/2012  . Bradycardia 06/13/2012  . Abscess 06/11/2012  . Chest pain 06/09/2012  . Cellulitis 06/09/2012  . Weakness of both legs 06/09/2012  . PMR (polymyalgia rheumatica) (HCC)   . Restless leg syndrome   . Angina pectoris, unstable ([facility]) 03/02/2012  . Anemia 03/02/2012  . Dyspnea 02/03/2012  . UTI (urinary tract infection) 02/03/2012  . CAD (coronary artery disease)   . HTN (hypertension)   . VTE (venous thromboembolism) 10/01/2010  . HYPERCHOLESTEROLEMIA  IIA 08/27/2009  . Hyperlipidemia 12/11/2008  . HYPERTENSION, BENIGN 12/11/2008    CMP     Component Value Date/Time   NA 137 03/21/2017   K 4.5 03/21/2017   CL 111 03/16/2017 0400   CO2 21 (L) 03/16/2017 0400   GLUCOSE 119 (H) 03/16/2017 0400   BUN 23 (A) 03/21/2017   CREATININE 1.0 03/21/2017   CREATININE 1.03 (H) 03/16/2017 0400   CALCIUM 8.7 (L) 03/16/2017 0400   PROT 6.7 03/11/2017 2031   ALBUMIN 3.4 (L) 03/11/2017 2031   AST 17 03/11/2017 2031   ALT 14 03/11/2017 2031   ALKPHOS 60 03/11/2017 2031   BILITOT 0.5 03/11/2017 2031   GFRNONAA 45 (L) 03/16/2017 0400   GFRAA 53 (L) 03/16/2017 0400    Recent Labs  03/14/17 0351  03/15/17 0402 03/16/17 03/16/17 0400 03/21/17  NA  137  < > 137 139 139 137  K 4.1  < > 4.1  --  4.0 4.5  CL 107  --  110  --  111  --   CO2 20*  --  19*  --  21*  --   GLUCOSE 98  --  100*  --  119*  --  BUN 16  < > 18 17 17  23*  CREATININE 1.22*  < > 1.15* 1.0 1.03* 1.0  CALCIUM 8.3*  --  8.4*  --  8.7*  --   < > = values in this interval not displayed.  Recent Labs  03/11/17 03/11/17 2031  AST 17 17  ALT 14 14  ALKPHOS 60 60  BILITOT  --  0.5  PROT  --  6.7  ALBUMIN  --  3.4*    Recent Labs  03/11/17 2031  03/12/17 0437  03/13/17 0255 03/15/17 03/15/17 0402 03/21/17  WBC 11.2*  < > 9.5  < > 10.1 7.7 7.7 9.0  NEUTROABS 8.2*  --   --   --  6.5  --  4.2  --   HGB 12.0  < > 10.8*  < > 12.7 10.3* 10.3* 10.2*  HCT 36.6  < > 33.0*  < > 38.6 32* 32.1* 34*  MCV 84.7  --  84.4  --  84.5  --  85.4  --   PLT 276  < > 264  < > 299 29* 290 309  < > = values in this interval not displayed. No results for input(s): CHOL, LDLCALC, TRIG in the last 8760 hours.  Invalid input(s): HCL No results found for: MICROALBUR No results found for: TSH No results found for: HGBA1C Lab Results  Component Value Date   CHOL 165 03/03/2012   HDL 68 03/03/2012   LDLCALC 79 03/03/2012   TRIG 90 03/03/2012   CHOLHDL 2.4 03/03/2012    Significant Diagnostic Results in last 30 days:  No results found.  Assessment and Plan  COUGH/PNEUMONIA/DYSPHAGIA-high suspicion for pneumonia with patient decreased saturations; have chest x-ray ordered; have ordered albuterol nebs scheduled and cough syrup scheduled for the next 7 days; we'll await stat chest x-ray results  Late entry-chest x-ray reading is back showing right lower lobe pneumonia; this is probably an aspiration pneumonia; calculated creatinine clearance is 27.3, was treated with Levaquin 750 mg every 48 hours 4 days; we'll continue to monitor; will add O2 sats fall below 92%      Geddy Boydstun D. [name], MD

## 2017-06-12 ENCOUNTER — Encounter: Payer: Self-pay | Admitting: Internal Medicine

## 2017-06-16 ENCOUNTER — Encounter: Payer: Self-pay | Admitting: Internal Medicine

## 2017-06-16 DIAGNOSIS — F419 Anxiety disorder, unspecified: Secondary | ICD-10-CM | POA: Insufficient documentation

## 2017-06-16 NOTE — Assessment & Plan Note (Signed)
Appears stable; continue silanor 6 mg by mouth daily at bedtime

## 2017-06-16 NOTE — Assessment & Plan Note (Signed)
On warfarin for chronic PE; is actively titrated weekly

## 2017-06-16 NOTE — Assessment & Plan Note (Signed)
No recent fasting lipid panel; continue Lipitor 10 mg by mouth daily and obtain new

## 2017-07-06 ENCOUNTER — Non-Acute Institutional Stay (SKILLED_NURSING_FACILITY): Payer: Medicare Other | Admitting: Internal Medicine

## 2017-07-06 DIAGNOSIS — G2581 Restless legs syndrome: Secondary | ICD-10-CM

## 2017-07-06 DIAGNOSIS — F5101 Primary insomnia: Secondary | ICD-10-CM

## 2017-07-06 DIAGNOSIS — Z86711 Personal history of pulmonary embolism: Secondary | ICD-10-CM

## 2017-07-06 NOTE — Progress Notes (Signed)
Location:  [facility] Room Number: 208D Place of Service:  SNF [phone])  Provider: [name], MD  Deland Pretty, MD  Patient Care Team: Deland Pretty, MD as PCP - General  Extended Emergency Contact Information Primary Emergency Contact: Laur,[name] Address: [place], [place] [address] [name] of [place] Phone: [phone] Mobile Phone: [phone] Relation: Son Secondary Emergency Contact: [place] of [place] Phone: [phone] Relation: Daughter    Allergies: Ambien [zolpidem tartrate]; Codeine phosphate; Darvocet [propoxyphene n-acetaminophen]; Promethazine hcl; Sulfamethoxazole; Vicodin [hydrocodone-acetaminophen]; Amoxicillin; and Penicillins  Chief Complaint  Patient presents with  . Medical Management of Chronic Issues    routine visit    HPI: Patient is 83 y.o. female who Is being seen for routine issues of history of PE, insomnia, and restless leg syndrome.  Past Medical History:  Diagnosis Date  . Anemia 03/02/2012  . Angina pectoris, unstable ([facility]) 03/02/2012  . Anxiety   . Axillary abscess   . Bradycardia 06/13/2012  . CAD (coronary artery disease)    S/p PTCA / stenting (last cath 2004, multiple LAD stents, 2 stents in the right coronary artery all patent)  . Cancer ([facility])   . Complication of anesthesia   . Compression fracture of L2 ([facility]) 03/12/2017  . Dementia   . Dysphagia 10/06/2012  . Dyspnea 02/03/2012  . GERD (gastroesophageal reflux disease)   . History of pulmonary embolus (PE) 03/19/2017  . HTN (hypertension)   . PMR (polymyalgia rheumatica) ([facility])   . PONV (postoperative nausea and vomiting)   . Restless leg syndrome   . 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 Started coumadin  . Weakness of both legs 06/09/2012    Past Surgical History:  Procedure Laterality Date  . BREAST LUMPECTOMY    . COLECTOMY    . ESOPHAGOGASTRODUODENOSCOPY  (EGD) WITH ESOPHAGEAL DILATION  10/10/2012   Procedure: ESOPHAGOGASTRODUODENOSCOPY (EGD) WITH ESOPHAGEAL DILATION;  Surgeon: Inda Castle, MD;  Location: [facility];  Service: Endoscopy;  Laterality: N/A;  . heart stents  x 8  . INCISION AND DRAINAGE     bilateral axillary, non specific staff  . INCISION AND DRAINAGE ABSCESS  09/28/2012   Procedure: INCISION AND DRAINAGE ABSCESS;  Surgeon: Zenovia Jarred, MD;  Location: [facility];  Service: General;  Laterality: Bilateral;  . stent     cardiac x 8 stents.    Allergies as of 07/06/2017      Reactions   Ambien [zolpidem Tartrate] Nausea And Vomiting   Codeine Phosphate Nausea And Vomiting   Darvocet [propoxyphene N-acetaminophen] Nausea And Vomiting   Promethazine Hcl Nausea And Vomiting   Sulfamethoxazole Other (See Comments)   Pt doesn't remember reaction   Vicodin [hydrocodone-acetaminophen] Other (See Comments)   Unknown reaction   Amoxicillin Nausea And Vomiting, Rash   Penicillins Nausea And Vomiting, Rash      Medication List       Accurate as of 07/06/17 11:59 PM. Always use your most recent med list.          acetaminophen 500 MG tablet Commonly known as:  TYLENOL Take 500 mg by mouth 3 (three) times daily as needed for moderate pain (NOT MORE THAN 3,000 MG IN 24 HOUR PERIOD). For pain   amLODipine 10 MG tablet Commonly known as:  NORVASC Take 10 mg by mouth every morning.   aspirin EC 81 MG tablet Take  1 tablet (81 mg total) by mouth daily.   atorvastatin 10 MG tablet Commonly known as:  LIPITOR Take 10 mg by mouth every evening.   cholecalciferol 1000 units tablet Commonly known as:  VITAMIN D Take 2,000 Units by mouth daily with lunch.   cholestyramine light 4 g packet Commonly known as:  PREVALITE Take 4 g by mouth daily as needed (for loose bowels).   donepezil 10 MG tablet Commonly known as:  ARICEPT Take 10 mg by mouth every morning.   fluticasone 50 MCG/ACT nasal spray Commonly known as:   FLONASE Place 1 spray into both nostrils daily as needed for allergies or rhinitis.   hydrALAZINE 25 MG tablet Commonly known as:  APRESOLINE Take 1 tablet (25 mg total) by mouth every 8 (eight) hours.   loratadine 10 MG tablet Commonly known as:  CLARITIN Take 10 mg by mouth daily as needed for allergies. Reported on 05/03/2016   losartan 50 MG tablet Commonly known as:  COZAAR Take 50 mg by mouth daily.   nitroGLYCERIN 0.4 MG SL tablet Commonly known as:  NITROSTAT Place 0.4 mg under the tongue every 5 (five) minutes as needed for chest pain.   nystatin powder Generic drug:  nystatin Apply 1 g topically daily as needed (irrittation).   oxybutynin 5 MG tablet Commonly known as:  DITROPAN Take 5 mg by mouth 2 (two) times daily.   pantoprazole 40 MG tablet Commonly known as:  PROTONIX Take 40 mg by mouth 2 (two) times daily as needed (for heartburn).   potassium chloride SA 20 MEQ tablet Commonly known as:  K-DUR,KLOR-CON Take 20 mEq by mouth daily with lunch.   predniSONE 1 MG tablet Commonly known as:  DELTASONE Take 1 mg by mouth daily with breakfast.   PROSTATE PO Take by mouth. Take 30 ml three times daily   rOPINIRole 1 MG tablet Commonly known as:  REQUIP Take 1 mg by mouth 3 (three) times daily. 1mg  at noon, 1mg  2 hours before bed, then 1mg  at bedtime   sertraline 25 MG tablet Commonly known as:  ZOLOFT Take 25 mg by mouth every morning.   SILENOR 6 MG Tabs Generic drug:  Doxepin HCl Take 6 mg by mouth at bedtime.   traMADol 50 MG tablet Commonly known as:  ULTRAM Take 0.5 tablets (25 mg total) by mouth 3 (three) times daily as needed for moderate pain or severe pain.   warfarin 2 MG tablet Commonly known as:  COUMADIN Take 2 mg by mouth. Take one tablet Mon & Friday. Take 1.5 mg Tues, Wed, Thurs, Sat, and Sun       Meds ordered this encounter  Medications  . Specialty Vitamins Products (PROSTATE PO)    Sig: Take by mouth. Take 30 ml three  times daily    Immunization History  Administered Date(s) Administered  . Influenza-Unspecified 08/01/2016  . PPD Test 03/18/2017    Social History  Substance Use Topics  . Smoking status: Never Smoker  . Smokeless tobacco: Never Used  . Alcohol use No    Review of Systems  DATA OBTAINED: from patient, nurse GENERAL:  no fevers, fatigue, appetite changes SKIN: No itching, rash HEENT: No complaint RESPIRATORY: No cough, wheezing, SOB CARDIAC: No chest pain, palpitations, lower extremity edema  GI: No abdominal pain, No N/V/D or constipation, No heartburn or reflux  GU: No dysuria, frequency or urgency, or incontinence  MUSCULOSKELETAL: No unrelieved bone/joint pain NEUROLOGIC: No headache, dizziness  PSYCHIATRIC: No overt anxiety  or sadness  Vitals:   07/06/17 1143  BP: 135/80  Pulse: 78  Resp: 18  Temp: (!) 97 F (36.1 C)   Body mass index is 25.89 kg/m. Physical Exam  GENERAL APPEARANCE: Alert, conversant, No acute distress  SKIN: No diaphoresis rash HEENT: Unremarkable RESPIRATORY: Breathing is even, unlabored. Lung sounds are clear   CARDIOVASCULAR: Heart RRR no murmurs, rubs or gallops. No peripheral edema  GASTROINTESTINAL: Abdomen is soft, non-tender, not distended w/ normal bowel sounds.  GENITOURINARY: Bladder non tender, not distended  MUSCULOSKELETAL: No abnormal joints or musculature NEUROLOGIC: Cranial nerves 2-12 grossly intact. Moves all extremities PSYCHIATRIC: Mood and affect With dementia, no behavioral issues  Patient's physical exam has not changed since prior visit.  Patient Active Problem List   Diagnosis Date Noted  . Insomnia 07/31/2017  . Anxiety 06/16/2017  . Citrobacter infection 03/19/2017  . AKI (acute kidney injury) ([facility]) 03/19/2017  . History of pulmonary embolus (PE) 03/19/2017  . Elevated INR 03/19/2017  . Candidal intertrigo 03/19/2017  . Compression fracture of L2 ([place] [facility]) 03/12/2017  . Acute lower UTI 03/12/2017  .  Accelerated hypertension 03/12/2017  . Leukocytosis 07/31/2015  . Acute bronchitis 07/28/2015  . Chronic anticoagulation 07/28/2015  . Intractable back pain 07/20/2015  . Back pain 07/20/2015  . Cellulitis and abscess of left buttock 04/12/2013  . Stricture and stenosis of esophagus 10/10/2012  . Dementia 10/06/2012  . Dysphagia 10/06/2012  . Axillary abscess - bilateral, multiple 09/27/2012  . Hypotension 08/24/2012  . Near syncope 08/24/2012  . Abscess of axilla, left 06/27/2012  . Bradycardia 06/13/2012  . Abscess 06/11/2012  . Chest pain 06/09/2012  . Cellulitis 06/09/2012  . Weakness of both legs 06/09/2012  . PMR (polymyalgia rheumatica) (HCC)   . Restless leg syndrome   . Angina pectoris, unstable ([facility]) 03/02/2012  . Anemia 03/02/2012  . Dyspnea 02/03/2012  . UTI (urinary tract infection) 02/03/2012  . CAD (coronary artery disease)   . HTN (hypertension)   . VTE (venous thromboembolism) 10/01/2010  . HYPERCHOLESTEROLEMIA  IIA 08/27/2009  . Hyperlipidemia 12/11/2008  . HYPERTENSION, BENIGN 12/11/2008    CMP     Component Value Date/Time   NA 137 03/21/2017   K 4.5 03/21/2017   CL 111 03/16/2017 0400   CO2 21 (L) 03/16/2017 0400   GLUCOSE 119 (H) 03/16/2017 0400   BUN 23 (A) 03/21/2017   CREATININE 1.0 03/21/2017   CREATININE 1.03 (H) 03/16/2017 0400   CALCIUM 8.7 (L) 03/16/2017 0400   PROT 6.7 03/11/2017 2031   ALBUMIN 3.4 (L) 03/11/2017 2031   AST 17 03/11/2017 2031   ALT 14 03/11/2017 2031   ALKPHOS 60 03/11/2017 2031   BILITOT 0.5 03/11/2017 2031   GFRNONAA 45 (L) 03/16/2017 0400   GFRAA 53 (L) 03/16/2017 0400    Recent Labs  03/14/17 0351  03/15/17 0402 03/16/17 03/16/17 0400 03/21/17  NA 137  < > 137 139 139 137  K 4.1  < > 4.1  --  4.0 4.5  CL 107  --  110  --  111  --   CO2 20*  --  19*  --  21*  --   GLUCOSE 98  --  100*  --  119*  --   BUN 16  < > 18 17 17  23*  CREATININE 1.22*  < > 1.15* 1.0 1.03* 1.0  CALCIUM 8.3*  --  8.4*  --  8.7*   --   < > = values in this interval not  displayed.  Recent Labs  03/11/17 03/11/17 2031  AST 17 17  ALT 14 14  ALKPHOS 60 60  BILITOT  --  0.5  PROT  --  6.7  ALBUMIN  --  3.4*    Recent Labs  03/11/17 2031  03/12/17 0437  03/13/17 0255 03/15/17 03/15/17 0402 03/21/17  WBC 11.2*  < > 9.5  < > 10.1 7.7 7.7 9.0  NEUTROABS 8.2*  --   --   --  6.5  --  4.2  --   HGB 12.0  < > 10.8*  < > 12.7 10.3* 10.3* 10.2*  HCT 36.6  < > 33.0*  < > 38.6 32* 32.1* 34*  MCV 84.7  --  84.4  --  84.5  --  85.4  --   PLT 276  < > 264  < > 299 29* 290 309  < > = values in this interval not displayed. No results for input(s): CHOL, LDLCALC, TRIG in the last 8760 hours.  Invalid input(s): HCL No results found for: MICROALBUR No results found for: TSH No results found for: HGBA1C Lab Results  Component Value Date   CHOL 165 03/03/2012   HDL 68 03/03/2012   LDLCALC 79 03/03/2012   TRIG 90 03/03/2012   CHOLHDL 2.4 03/03/2012    Significant Diagnostic Results in last 30 days:  No results found.  Assessment and Plan  History of pulmonary embolus (PE) Patient is on chronic Coumadin for same; Coumadin is actively titrated  Insomnia No complaint of problems; continue doxepin 6 mg by mouth daily at bedtime  Restless leg syndrome Controlled; continue Requip 1 mg by mouth 3 times a day    Pegah Segel D. [name], MD

## 2017-07-14 DIAGNOSIS — M6281 Muscle weakness (generalized): Secondary | ICD-10-CM | POA: Diagnosis not present

## 2017-07-14 DIAGNOSIS — Z9181 History of falling: Secondary | ICD-10-CM | POA: Diagnosis not present

## 2017-07-20 DIAGNOSIS — M6281 Muscle weakness (generalized): Secondary | ICD-10-CM | POA: Diagnosis not present

## 2017-07-20 DIAGNOSIS — Z9181 History of falling: Secondary | ICD-10-CM | POA: Diagnosis not present

## 2017-07-21 DIAGNOSIS — M6281 Muscle weakness (generalized): Secondary | ICD-10-CM | POA: Diagnosis not present

## 2017-07-21 DIAGNOSIS — Z9181 History of falling: Secondary | ICD-10-CM | POA: Diagnosis not present

## 2017-07-29 DIAGNOSIS — M6281 Muscle weakness (generalized): Secondary | ICD-10-CM | POA: Diagnosis not present

## 2017-07-29 DIAGNOSIS — Z9181 History of falling: Secondary | ICD-10-CM | POA: Diagnosis not present

## 2017-07-31 ENCOUNTER — Encounter: Payer: Self-pay | Admitting: Internal Medicine

## 2017-07-31 DIAGNOSIS — G4700 Insomnia, unspecified: Secondary | ICD-10-CM | POA: Insufficient documentation

## 2017-07-31 NOTE — Assessment & Plan Note (Signed)
No complaint of problems; continue doxepin 6 mg by mouth daily at bedtime

## 2017-07-31 NOTE — Assessment & Plan Note (Signed)
Patient is on chronic Coumadin for same; Coumadin is actively titrated

## 2017-07-31 NOTE — Assessment & Plan Note (Signed)
Controlled; continue Requip 1 mg by mouth 3 times a day

## 2017-08-01 DIAGNOSIS — M6281 Muscle weakness (generalized): Secondary | ICD-10-CM | POA: Diagnosis not present

## 2017-08-01 DIAGNOSIS — Z9181 History of falling: Secondary | ICD-10-CM | POA: Diagnosis not present

## 2017-08-01 DIAGNOSIS — R1312 Dysphagia, oropharyngeal phase: Secondary | ICD-10-CM | POA: Diagnosis not present

## 2017-08-01 DIAGNOSIS — R488 Other symbolic dysfunctions: Secondary | ICD-10-CM | POA: Diagnosis not present

## 2017-08-02 ENCOUNTER — Encounter: Payer: Self-pay | Admitting: Internal Medicine

## 2017-08-02 ENCOUNTER — Non-Acute Institutional Stay (SKILLED_NURSING_FACILITY): Payer: Medicare Other | Admitting: Internal Medicine

## 2017-08-02 DIAGNOSIS — I8290 Acute embolism and thrombosis of unspecified vein: Secondary | ICD-10-CM

## 2017-08-02 DIAGNOSIS — I2510 Atherosclerotic heart disease of native coronary artery without angina pectoris: Secondary | ICD-10-CM | POA: Diagnosis not present

## 2017-08-02 DIAGNOSIS — I10 Essential (primary) hypertension: Secondary | ICD-10-CM

## 2017-08-02 NOTE — Progress Notes (Signed)
Location:  [facility] Room Number: 208D Place of Service:  SNF [phone])  Provider: [name], MD  Deland Pretty, MD  Patient Care Team: Deland Pretty, MD as PCP - General  Extended Emergency Contact Information Primary Emergency Contact: Wayment,[name] Address: [place], [place] [address] [name] of [place] Phone: [phone] Mobile Phone: [phone] Relation: Son Secondary Emergency Contact: [place] of [place] Phone: [phone] Relation: Daughter    Allergies: Ambien [zolpidem tartrate]; Codeine phosphate; Darvocet [propoxyphene n-acetaminophen]; Promethazine hcl; Sulfamethoxazole; Vicodin [hydrocodone-acetaminophen]; Amoxicillin; and Penicillins  Chief Complaint  Patient presents with  . Medical Management of Chronic Issues    routine visit    HPI: Patient is 83 y.o. female who Is being seen for routine issues of coronary artery disease, hypertension, and DVT.  Past Medical History:  Diagnosis Date  . Anemia 03/02/2012  . Angina pectoris, unstable ([place]) 03/02/2012  . Anxiety   . Axillary abscess   . Bradycardia 06/13/2012  . CAD (coronary artery disease)    S/p PTCA / stenting (last cath 2004, multiple LAD stents, 2 stents in the right coronary artery all patent)  . Cancer ([place])   . Complication of anesthesia   . Compression fracture of L2 ([place]) 03/12/2017  . Dementia   . Dysphagia 10/06/2012  . Dyspnea 02/03/2012  . GERD (gastroesophageal reflux disease)   . History of pulmonary embolus (PE) 03/19/2017  . HTN (hypertension)   . PMR (polymyalgia rheumatica) (HCC)   . PONV (postoperative nausea and vomiting)   . Restless leg syndrome   . 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 Started coumadin  . Weakness of both legs 06/09/2012    Past Surgical History:  Procedure Laterality Date  . BREAST LUMPECTOMY    . COLECTOMY    . ESOPHAGOGASTRODUODENOSCOPY  (EGD) WITH ESOPHAGEAL DILATION  10/10/2012   Procedure: ESOPHAGOGASTRODUODENOSCOPY (EGD) WITH ESOPHAGEAL DILATION;  Surgeon: Inda Castle, MD;  Location: [facility];  Service: Endoscopy;  Laterality: N/A;  . heart stents  x 8  . INCISION AND DRAINAGE     bilateral axillary, non specific staff  . INCISION AND DRAINAGE ABSCESS  09/28/2012   Procedure: INCISION AND DRAINAGE ABSCESS;  Surgeon: Zenovia Jarred, MD;  Location: [facility];  Service: General;  Laterality: Bilateral;  . stent     cardiac x 8 stents.    Allergies as of 08/02/2017      Reactions   Ambien [zolpidem Tartrate] Nausea And Vomiting   Codeine Phosphate Nausea And Vomiting   Darvocet [propoxyphene N-acetaminophen] Nausea And Vomiting   Promethazine Hcl Nausea And Vomiting   Sulfamethoxazole Other (See Comments)   Pt doesn't remember reaction   Vicodin [hydrocodone-acetaminophen] Other (See Comments)   Unknown reaction   Amoxicillin Nausea And Vomiting, Rash   Penicillins Nausea And Vomiting, Rash      Medication List       Accurate as of 08/02/17 11:59 PM. Always use your most recent med list.          acetaminophen 500 MG tablet Commonly known as:  TYLENOL Take 500 mg by mouth 3 (three) times daily as needed for moderate pain (NOT MORE THAN 3,000 MG IN 24 HOUR PERIOD). For pain   amLODipine 10 MG tablet Commonly known as:  NORVASC Take 10 mg by mouth every morning.   aspirin EC 81 MG tablet Take 1 tablet (  81 mg total) by mouth daily.   atorvastatin 10 MG tablet Commonly known as:  LIPITOR Take 10 mg by mouth every evening.   cholecalciferol 1000 units tablet Commonly known as:  VITAMIN D Take 2,000 Units by mouth daily with lunch.   cholestyramine light 4 g packet Commonly known as:  PREVALITE Take 4 g by mouth daily as needed (for loose bowels).   donepezil 10 MG tablet Commonly known as:  ARICEPT Take 10 mg by mouth every morning.   fluticasone 50 MCG/ACT nasal spray Commonly known as:   FLONASE Place 1 spray into both nostrils daily as needed for allergies or rhinitis.   hydrALAZINE 25 MG tablet Commonly known as:  APRESOLINE Take 1 tablet (25 mg total) by mouth every 8 (eight) hours.   loratadine 10 MG tablet Commonly known as:  CLARITIN Take 10 mg by mouth daily as needed for allergies. Reported on 05/03/2016   losartan 50 MG tablet Commonly known as:  COZAAR Take 50 mg by mouth daily.   nitroGLYCERIN 0.4 MG SL tablet Commonly known as:  NITROSTAT Place 0.4 mg under the tongue every 5 (five) minutes as needed for chest pain.   nystatin powder Generic drug:  nystatin Apply 1 g topically daily as needed (irrittation).   oxybutynin 5 MG tablet Commonly known as:  DITROPAN Take 5 mg by mouth 2 (two) times daily.   pantoprazole 40 MG tablet Commonly known as:  PROTONIX Take 40 mg by mouth 2 (two) times daily as needed (for heartburn).   potassium chloride SA 20 MEQ tablet Commonly known as:  K-DUR,KLOR-CON Take 20 mEq by mouth daily with lunch.   predniSONE 1 MG tablet Commonly known as:  DELTASONE Take 1 mg by mouth daily with breakfast.   PROSTATE PO Take by mouth. Take 30 ml three times daily   rOPINIRole 1 MG tablet Commonly known as:  REQUIP Take 1 mg by mouth 3 (three) times daily. 1mg  at noon, 1mg  2 hours before bed, then 1mg  at bedtime   sertraline 25 MG tablet Commonly known as:  ZOLOFT Take 25 mg by mouth every morning.   SILENOR 6 MG Tabs Generic drug:  Doxepin HCl Take 6 mg by mouth at bedtime.   traMADol 50 MG tablet Commonly known as:  ULTRAM Take 0.5 tablets (25 mg total) by mouth 3 (three) times daily as needed for moderate pain or severe pain.   warfarin 2 MG tablet Commonly known as:  COUMADIN Take 2 mg by mouth. Take one tablet Mon & Friday. Take 1.5 mg Tues, Wed, Thurs, Sat, and Sun       No orders of the defined types were placed in this encounter.   Immunization History  Administered Date(s) Administered  .  Influenza-Unspecified 08/01/2016  . PPD Test 03/18/2017    Social History  Substance Use Topics  . Smoking status: Never Smoker  . Smokeless tobacco: Never Used  . Alcohol use No    Review of Systems  DATA OBTAINED: from patient, nurse GENERAL:  no fevers, fatigue, appetite changes SKIN: No itching, rash HEENT: No complaint RESPIRATORY: No cough, wheezing, SOB CARDIAC: No chest pain, palpitations, lower extremity edema  GI: No abdominal pain, No N/V/D or constipation, No heartburn or reflux  GU: No dysuria, frequency or urgency, or incontinence  MUSCULOSKELETAL: No unrelieved bone/joint pain NEUROLOGIC: No headache, dizziness  PSYCHIATRIC: No overt anxiety or sadness  Vitals:   08/02/17 1635  BP: 135/80  Pulse: 72  Resp: 18  Temp: (!) 97 F (36.1 C)   Body mass index is 24.71 kg/m. Physical Exam  GENERAL APPEARANCE: Alert, conversant, No acute distress  SKIN: No diaphoresis rash HEENT: Unremarkable RESPIRATORY: Breathing is even, unlabored. Lung sounds are clear   CARDIOVASCULAR: Heart RRR no murmurs, rubs or gallops. No peripheral edema  GASTROINTESTINAL: Abdomen is soft, non-tender, not distended w/ normal bowel sounds.  GENITOURINARY: Bladder non tender, not distended  MUSCULOSKELETAL: No abnormal joints or musculature NEUROLOGIC: Cranial nerves 2-12 grossly intact. Moves all extremities PSYCHIATRIC: Mood and affect appropriate to situationWith dementia, no behavioral issues  Patient Active Problem List   Diagnosis Date Noted  . Insomnia 07/31/2017  . Anxiety 06/16/2017  . Citrobacter infection 03/19/2017  . AKI (acute kidney injury) ([facility]) 03/19/2017  . History of pulmonary embolus (PE) 03/19/2017  . Elevated INR 03/19/2017  . Candidal intertrigo 03/19/2017  . Compression fracture of L2 ([place]) 03/12/2017  . Acute lower UTI 03/12/2017  . Accelerated hypertension 03/12/2017  . Leukocytosis 07/31/2015  . Acute bronchitis 07/28/2015  . Chronic  anticoagulation 07/28/2015  . Intractable back pain 07/20/2015  . Back pain 07/20/2015  . Cellulitis and abscess of left buttock 04/12/2013  . Stricture and stenosis of esophagus 10/10/2012  . Dementia 10/06/2012  . Dysphagia 10/06/2012  . Axillary abscess - bilateral, multiple 09/27/2012  . Hypotension 08/24/2012  . Near syncope 08/24/2012  . Abscess of axilla, left 06/27/2012  . Bradycardia 06/13/2012  . Abscess 06/11/2012  . Chest pain 06/09/2012  . Cellulitis 06/09/2012  . Weakness of both legs 06/09/2012  . PMR (polymyalgia rheumatica) (HCC)   . Restless leg syndrome   . Angina pectoris, unstable ([place]) 03/02/2012  . Anemia 03/02/2012  . Dyspnea 02/03/2012  . UTI (urinary tract infection) 02/03/2012  . CAD (coronary artery disease)   . HTN (hypertension)   . VTE (venous thromboembolism) 10/01/2010  . HYPERCHOLESTEROLEMIA  IIA 08/27/2009  . Hyperlipidemia 12/11/2008  . HYPERTENSION, BENIGN 12/11/2008    CMP     Component Value Date/Time   NA 137 03/21/2017   K 4.5 03/21/2017   CL 111 03/16/2017 0400   CO2 21 (L) 03/16/2017 0400   GLUCOSE 119 (H) 03/16/2017 0400   BUN 23 (A) 03/21/2017   CREATININE 1.0 03/21/2017   CREATININE 1.03 (H) 03/16/2017 0400   CALCIUM 8.7 (L) 03/16/2017 0400   PROT 6.7 03/11/2017 2031   ALBUMIN 3.4 (L) 03/11/2017 2031   AST 17 03/11/2017 2031   ALT 14 03/11/2017 2031   ALKPHOS 60 03/11/2017 2031   BILITOT 0.5 03/11/2017 2031   GFRNONAA 45 (L) 03/16/2017 0400   GFRAA 53 (L) 03/16/2017 0400    Recent Labs  03/14/17 0351  03/15/17 0402 03/16/17 03/16/17 0400 03/21/17  NA 137  < > 137 139 139 137  K 4.1  < > 4.1  --  4.0 4.5  CL 107  --  110  --  111  --   CO2 20*  --  19*  --  21*  --   GLUCOSE 98  --  100*  --  119*  --   BUN 16  < > 18 17 17  23*  CREATININE 1.22*  < > 1.15* 1.0 1.03* 1.0  CALCIUM 8.3*  --  8.4*  --  8.7*  --   < > = values in this interval not displayed.  Recent Labs  03/11/17 03/11/17 2031  AST 17 17    ALT 14 14  ALKPHOS 60 60  BILITOT  --  0.5  PROT  --  6.7  ALBUMIN  --  3.4*    Recent Labs  03/11/17 2031  03/12/17 0437  03/13/17 0255 03/15/17 03/15/17 0402 03/21/17  WBC 11.2*  < > 9.5  < > 10.1 7.7 7.7 9.0  NEUTROABS 8.2*  --   --   --  6.5  --  4.2  --   HGB 12.0  < > 10.8*  < > 12.7 10.3* 10.3* 10.2*  HCT 36.6  < > 33.0*  < > 38.6 32* 32.1* 34*  MCV 84.7  --  84.4  --  84.5  --  85.4  --   PLT 276  < > 264  < > 299 29* 290 309  < > = values in this interval not displayed. No results for input(s): CHOL, LDLCALC, TRIG in the last 8760 hours.  Invalid input(s): HCL No results found for: MICROALBUR No results found for: TSH No results found for: HGBA1C Lab Results  Component Value Date   CHOL 165 03/03/2012   HDL 68 03/03/2012   LDLCALC 79 03/03/2012   TRIG 90 03/03/2012   CHOLHDL 2.4 03/03/2012    Significant Diagnostic Results in last 30 days:  No results found.  Assessment and Plan  CAD (coronary artery disease) No reported chest pain; plan to continue ASA 81 mg by mouth daily, statin, and sublingual nitroglycerin when necessary  HTN (hypertension) Well controlled today; plan to continue 1 Norvasc 10 mg daily, hydralazine 25 mg by mouth 3 times a day and losartan 50 mg by mouth daily  VTE (venous thromboembolism) Chronic; patient continues on Coumadin which is stable on 3 mg daily; actively titrated when INR is not stable    Anne D. [name], MD

## 2017-08-07 ENCOUNTER — Encounter: Payer: Self-pay | Admitting: Internal Medicine

## 2017-08-07 NOTE — Assessment & Plan Note (Signed)
No reported chest pain; plan to continue ASA 81 mg by mouth daily, statin, and sublingual nitroglycerin when necessary

## 2017-08-07 NOTE — Assessment & Plan Note (Signed)
Well controlled today; plan to continue 1 Norvasc 10 mg daily, hydralazine 25 mg by mouth 3 times a day and losartan 50 mg by mouth daily

## 2017-08-07 NOTE — Assessment & Plan Note (Signed)
Chronic; patient continues on Coumadin which is stable on 3 mg daily; actively titrated when INR is not stable

## 2017-08-09 DIAGNOSIS — R1312 Dysphagia, oropharyngeal phase: Secondary | ICD-10-CM | POA: Diagnosis not present

## 2017-08-09 DIAGNOSIS — Z9181 History of falling: Secondary | ICD-10-CM | POA: Diagnosis not present

## 2017-08-09 DIAGNOSIS — R488 Other symbolic dysfunctions: Secondary | ICD-10-CM | POA: Diagnosis not present

## 2017-08-09 DIAGNOSIS — M6281 Muscle weakness (generalized): Secondary | ICD-10-CM | POA: Diagnosis not present

## 2017-08-10 DIAGNOSIS — E119 Type 2 diabetes mellitus without complications: Secondary | ICD-10-CM | POA: Diagnosis not present

## 2017-08-10 DIAGNOSIS — I10 Essential (primary) hypertension: Secondary | ICD-10-CM | POA: Diagnosis not present

## 2017-08-10 DIAGNOSIS — E785 Hyperlipidemia, unspecified: Secondary | ICD-10-CM | POA: Diagnosis not present

## 2017-08-10 DIAGNOSIS — E039 Hypothyroidism, unspecified: Secondary | ICD-10-CM | POA: Diagnosis not present

## 2017-09-12 ENCOUNTER — Non-Acute Institutional Stay (SKILLED_NURSING_FACILITY): Payer: Medicare Other | Admitting: Internal Medicine

## 2017-09-12 ENCOUNTER — Encounter: Payer: Self-pay | Admitting: Internal Medicine

## 2017-09-12 DIAGNOSIS — N3281 Overactive bladder: Secondary | ICD-10-CM

## 2017-09-12 DIAGNOSIS — F419 Anxiety disorder, unspecified: Secondary | ICD-10-CM

## 2017-09-12 DIAGNOSIS — E785 Hyperlipidemia, unspecified: Secondary | ICD-10-CM

## 2017-09-12 NOTE — Progress Notes (Signed)
Location:  [facility] Room Number: 208D Place of Service:  SNF ([id number])  Hennie Duos, MD  Patient Care Team: Hennie Duos, MD as PCP - General (Internal Medicine)  Extended Emergency Contact Information Primary Emergency Contact: Mehler,[name] Address: [address]          [place], [place] [address] [name] of [place] Phone: [phone] Mobile Phone: [phone] Relation: Son Secondary Emergency Contact: [place] of [place] Phone: [phone] Relation: Daughter    Allergies: Ambien [zolpidem tartrate]; Codeine phosphate; Darvocet [propoxyphene n-acetaminophen]; Promethazine hcl; Sulfamethoxazole; Vicodin [hydrocodone-acetaminophen]; Amoxicillin; and Penicillins  Chief Complaint  Patient presents with  . Medical Management of Chronic Issues    routine visit  . Health Maintenance    orders written for Prevnar, Tdap injection, schedule pt for DEXA -history of compression fracture L2    HPI: Patient is 83 y.o. female who is being seen for routine issues of hyperlipidemia, anxiety, and overactive bladder.  Past Medical History:  Diagnosis Date  . Anemia 03/02/2012  . Angina pectoris, unstable ([place]) 03/02/2012  . Anxiety   . Axillary abscess   . Bradycardia 06/13/2012  . CAD (coronary artery disease)    S/p PTCA / stenting (last cath 2004, multiple LAD stents, 2 stents in the right coronary artery all patent)  . Cancer ([facility])   . Complication of anesthesia   . Compression fracture of L2 ([place]) 03/12/2017  . Dementia   . Dysphagia 10/06/2012  . Dyspnea 02/03/2012  . GERD (gastroesophageal reflux disease)   . History of pulmonary embolus (PE) 03/19/2017  . HTN (hypertension)   . PMR (polymyalgia rheumatica) (HCC)   . PONV (postoperative nausea and vomiting)   . Restless leg syndrome   . 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 Started coumadin  . Weakness of both legs 06/09/2012     Past Surgical History:  Procedure Laterality Date  . BREAST LUMPECTOMY    . COLECTOMY    . ESOPHAGOGASTRODUODENOSCOPY (EGD) WITH ESOPHAGEAL DILATION  10/10/2012   Procedure: ESOPHAGOGASTRODUODENOSCOPY (EGD) WITH ESOPHAGEAL DILATION;  Surgeon: Inda Castle, MD;  Location: [facility];  Service: Endoscopy;  Laterality: N/A;  . heart stents  x 8  . INCISION AND DRAINAGE     bilateral axillary, non specific staff  . INCISION AND DRAINAGE ABSCESS  09/28/2012   Procedure: INCISION AND DRAINAGE ABSCESS;  Surgeon: Zenovia Jarred, MD;  Location: [facility];  Service: General;  Laterality: Bilateral;  . stent     cardiac x 8 stents.    Allergies as of 09/12/2017      Reactions   Ambien [zolpidem Tartrate] Nausea And Vomiting   Codeine Phosphate Nausea And Vomiting   Darvocet [propoxyphene N-acetaminophen] Nausea And Vomiting   Promethazine Hcl Nausea And Vomiting   Sulfamethoxazole Other (See Comments)   Pt doesn't remember reaction   Vicodin [hydrocodone-acetaminophen] Other (See Comments)   Unknown reaction   Amoxicillin Nausea And Vomiting, Rash   Penicillins Nausea And Vomiting, Rash      Medication List        Accurate as of 09/12/17 11:59 PM. Always use your most recent med list.          amLODipine 10 MG tablet Commonly known as:  NORVASC Take 10 mg by mouth every morning.   aspirin EC 81 MG tablet Take 1 tablet (81 mg total) by mouth daily.   atorvastatin 10 MG tablet Commonly known as:  LIPITOR Take 10 mg by mouth every evening.   cholecalciferol 1000 units tablet Commonly known as:  VITAMIN D Take 2,000 Units by mouth daily with lunch.   cholestyramine light 4 g packet Commonly known as:  PREVALITE Take 4 g by mouth daily as needed (for loose bowels).   donepezil 10 MG tablet Commonly known as:  ARICEPT Take 10 mg by mouth every morning.   fluticasone 50 MCG/ACT nasal spray Commonly known as:  FLONASE Place 1 spray into both nostrils daily as  needed for allergies or rhinitis.   hydrALAZINE 25 MG tablet Commonly known as:  APRESOLINE Take 1 tablet (25 mg total) by mouth every 8 (eight) hours.   loratadine 10 MG tablet Commonly known as:  CLARITIN Take 10 mg by mouth daily as needed for allergies. Reported on 05/03/2016   losartan 50 MG tablet Commonly known as:  COZAAR Take 50 mg by mouth daily.   mirtazapine 15 MG tablet Commonly known as:  REMERON Take 15 mg by mouth. Take half a tablet nightly for appeite   nitroGLYCERIN 0.4 MG SL tablet Commonly known as:  NITROSTAT Place 0.4 mg under the tongue every 5 (five) minutes as needed for chest pain.   nystatin powder Generic drug:  nystatin Apply 1 g topically daily as needed (irrittation).   ondansetron 4 MG tablet Commonly known as:  ZOFRAN Take 4 mg by mouth. Take one tablet every 4 hours as needed for nausea & vomitine   oxybutynin 5 MG tablet Commonly known as:  DITROPAN Take 5 mg by mouth 2 (two) times daily.   pantoprazole 40 MG tablet Commonly known as:  PROTONIX Take 40 mg by mouth 2 (two) times daily as needed (for heartburn).   potassium chloride SA 20 MEQ tablet Commonly known as:  K-DUR,KLOR-CON Take 20 mEq by mouth daily with lunch.   PROSTATE PO Take by mouth. Take 30 ml three times daily   rOPINIRole 1 MG tablet Commonly known as:  REQUIP Take 1 mg by mouth. 1mg  at noon, 1mg  2 hours before bed, then 1mg  at bedtime   sertraline 25 MG tablet Commonly known as:  ZOLOFT Take 25 mg by mouth every morning.   SILENOR 6 MG Tabs Generic drug:  Doxepin HCl Take 6 mg by mouth at bedtime.   warfarin 2 MG tablet Commonly known as:  COUMADIN Take 2 mg by mouth. Take one tablet daily       No orders of the defined types were placed in this encounter.   Immunization History  Administered Date(s) Administered  . Influenza-Unspecified 08/01/2016, 08/11/2017  . PPD Test 03/18/2017    Social History   Tobacco Use  . Smoking status: Never  Smoker  . Smokeless tobacco: Never Used  Substance Use Topics  . Alcohol use: No    Review of Systems  DATA OBTAINED: from patient, nurse GENERAL:  no fevers, fatigue, appetite changes SKIN: No itching, rash HEENT: No complaint RESPIRATORY: No cough, wheezing, SOB CARDIAC: No chest pain, palpitations, lower extremity edema  GI: No abdominal pain, No N/V/D or constipation, No heartburn or reflux  GU: No dysuria, frequency or urgency, or incontinence  MUSCULOSKELETAL: No unrelieved bone/joint pain NEUROLOGIC: No headache, dizziness  PSYCHIATRIC: No overt anxiety or sadness  Vitals:   09/12/17 1534  BP: 140/74  Pulse: 76  Resp: 20  Temp: (!) 97.1 F (36.2 C)   Body mass index is 24.56 kg/m. Physical Exam  GENERAL APPEARANCE: Alert, conversant, No acute distress  SKIN: No diaphoresis rash HEENT: Unremarkable RESPIRATORY: Breathing is even, unlabored. Lung sounds are clear   CARDIOVASCULAR: Heart RRR no murmurs, rubs or gallops. No peripheral edema  GASTROINTESTINAL: Abdomen is soft, non-tender, not distended w/ normal bowel sounds.  GENITOURINARY: Bladder non tender, not distended  MUSCULOSKELETAL: No abnormal joints or musculature NEUROLOGIC: Cranial nerves 2-12 grossly intact. Moves all extremities PSYCHIATRIC: Mood and affect appropriate to situation with dementia, no behavioral issues  Patient Active Problem List   Diagnosis Date Noted  . Overactive bladder 09/24/2017  . Insomnia 07/31/2017  . Anxiety 06/16/2017  . Citrobacter infection 03/19/2017  . AKI (acute kidney injury) ([facility]) 03/19/2017  . History of pulmonary embolus (PE) 03/19/2017  . Elevated INR 03/19/2017  . Candidal intertrigo 03/19/2017  . Compression fracture of L2 ([place]) 03/12/2017  . Acute lower UTI 03/12/2017  . Accelerated hypertension 03/12/2017  . Leukocytosis 07/31/2015  . Acute bronchitis 07/28/2015  . Chronic anticoagulation 07/28/2015  . Intractable back pain 07/20/2015  . Back pain  07/20/2015  . Cellulitis and abscess of left buttock 04/12/2013  . Stricture and stenosis of esophagus 10/10/2012  . Dementia 10/06/2012  . Dysphagia 10/06/2012  . Axillary abscess - bilateral, multiple 09/27/2012  . Hypotension 08/24/2012  . Near syncope 08/24/2012  . Abscess of axilla, left 06/27/2012  . Bradycardia 06/13/2012  . Abscess 06/11/2012  . Chest pain 06/09/2012  . Cellulitis 06/09/2012  . Weakness of both legs 06/09/2012  . PMR (polymyalgia rheumatica) (HCC)   . Restless leg syndrome   . Angina pectoris, unstable ([place]) 03/02/2012  . Anemia 03/02/2012  . Dyspnea 02/03/2012  . UTI (urinary tract infection) 02/03/2012  . CAD (coronary artery disease)   . HTN (hypertension)   . VTE (venous thromboembolism) 10/01/2010  . HYPERCHOLESTEROLEMIA  IIA 08/27/2009  . Hyperlipidemia 12/11/2008  . HYPERTENSION, BENIGN 12/11/2008    CMP     Component Value Date/Time   NA 137 03/21/2017   K 4.5 03/21/2017   CL 111 03/16/2017 0400   CO2 21 (L) 03/16/2017 0400   GLUCOSE 119 (H) 03/16/2017 0400   BUN 23 (A) 03/21/2017   CREATININE 1.0 03/21/2017   CREATININE 1.03 (H) 03/16/2017 0400   CALCIUM 8.7 (L) 03/16/2017 0400   PROT 6.7 03/11/2017 2031   ALBUMIN 3.4 (L) 03/11/2017 2031   AST 17 03/11/2017 2031   ALT 14 03/11/2017 2031   ALKPHOS 60 03/11/2017 2031   BILITOT 0.5 03/11/2017 2031   GFRNONAA 45 (L) 03/16/2017 0400   GFRAA 53 (L) 03/16/2017 0400   Recent Labs    03/14/17 0351  03/15/17 0402 03/16/17 03/16/17 0400 03/21/17  NA 137   < > 137 139 139 137  K 4.1   < > 4.1  --  4.0 4.5  CL 107  --  110  --  111  --   CO2 20*  --  19*  --  21*  --   GLUCOSE 98  --  100*  --  119*  --   BUN 16   < > 18 17 17  23*  CREATININE 1.22*   < > 1.15* 1.0 1.03* 1.0  CALCIUM 8.3*  --  8.4*  --  8.7*  --    < > = values in this interval not displayed.   Recent Labs    03/11/17 03/11/17 2031  AST 17 17  ALT 14 14  ALKPHOS 60 60  BILITOT  --  0.5  PROT  --  6.7  ALBUMIN   --  3.4*   Recent Labs    03/11/17 2031  03/12/17 0437  03/13/17 0255 03/15/17 03/15/17 0402 03/21/17  WBC 11.2*   < > 9.5   < > 10.1 7.7 7.7 9.0  NEUTROABS 8.2*  --   --   --  6.5  --  4.2  --   HGB 12.0   < > 10.8*   < > 12.7 10.3* 10.3* 10.2*  HCT 36.6   < > 33.0*   < > 38.6 32* 32.1* 34*  MCV 84.7  --  84.4  --  84.5  --  85.4  --   PLT 276   < > 264   < > 299 29* 290 309   < > = values in this interval not displayed.   No results for input(s): CHOL, LDLCALC, TRIG in the last 8760 hours.  Invalid input(s): HCL No results found for: MICROALBUR No results found for: TSH No results found for: HGBA1C Lab Results  Component Value Date   CHOL 165 03/03/2012   HDL 68 03/03/2012   LDLCALC 79 03/03/2012   TRIG 90 03/03/2012   CHOLHDL 2.4 03/03/2012    Significant Diagnostic Results in last 30 days:  No results found.  Assessment and Plan  Hyperlipidemia Almost time for new fasting lipid panel; continue Lipitor 10 mg by mouth daily  Anxiety Continues to be stable; plan to continue doxepin/silenor 6 mg by mouth daily at bedtime  Overactive bladder Chronic and stable without infections; continue Ditropan 5 mg by mouth twice a day    Trashaun Streight D. [name], MD

## 2017-09-24 ENCOUNTER — Encounter: Payer: Self-pay | Admitting: Internal Medicine

## 2017-09-24 DIAGNOSIS — N3281 Overactive bladder: Secondary | ICD-10-CM | POA: Insufficient documentation

## 2017-09-24 NOTE — Assessment & Plan Note (Signed)
Continues to be stable; plan to continue doxepin/silenor 6 mg by mouth daily at bedtime

## 2017-09-24 NOTE — Assessment & Plan Note (Signed)
Chronic and stable without infections; continue Ditropan 5 mg by mouth twice a day

## 2017-09-24 NOTE — Assessment & Plan Note (Signed)
Almost time for new fasting lipid panel; continue Lipitor 10 mg by mouth daily

## 2017-09-27 LAB — CBC AND DIFFERENTIAL
HEMATOCRIT: 32 — AB (ref 36–46)
HEMOGLOBIN: 10 — AB (ref 12.0–16.0)
PLATELETS: 250 (ref 150–399)
WBC: 6.7

## 2017-09-27 LAB — LIPID PANEL
CHOLESTEROL: 129 (ref 0–200)
HDL: 36 (ref 35–70)
LDL CALC: 71
TRIGLYCERIDES: 111 (ref 40–160)

## 2017-09-27 LAB — BASIC METABOLIC PANEL
BUN: 24 — AB (ref 4–21)
Creatinine: 1 (ref 0.5–1.1)
Glucose: 82
Potassium: 4.5 (ref 3.4–5.3)
Sodium: 139 (ref 137–147)

## 2017-09-27 LAB — TSH: TSH: 1.35 (ref 0.41–5.90)

## 2017-09-27 LAB — HEPATIC FUNCTION PANEL
ALK PHOS: 59 (ref 25–125)
ALT: 9 (ref 7–35)
AST: 13 (ref 13–35)
Bilirubin, Total: 0.2

## 2017-09-27 LAB — HEMOGLOBIN A1C: Hemoglobin A1C: 5.5

## 2017-09-27 LAB — VITAMIN D 25 HYDROXY (VIT D DEFICIENCY, FRACTURES): Vit D, 25-Hydroxy: 39.35

## 2017-09-30 ENCOUNTER — Other Ambulatory Visit: Payer: Self-pay

## 2017-10-12 ENCOUNTER — Encounter: Payer: Self-pay | Admitting: Internal Medicine

## 2017-10-12 ENCOUNTER — Non-Acute Institutional Stay (SKILLED_NURSING_FACILITY): Payer: Medicare Other | Admitting: Internal Medicine

## 2017-10-12 DIAGNOSIS — G2581 Restless legs syndrome: Secondary | ICD-10-CM

## 2017-10-12 DIAGNOSIS — M353 Polymyalgia rheumatica: Secondary | ICD-10-CM

## 2017-10-12 DIAGNOSIS — Z86711 Personal history of pulmonary embolism: Secondary | ICD-10-CM

## 2017-10-12 NOTE — Progress Notes (Signed)
Location:  [facility] Room Number: 208D Place of Service:  SNF (31)  Hennie Duos, MD  Patient Care Team: Hennie Duos, MD as PCP - General (Internal Medicine)  Extended Emergency Contact Information Primary Emergency Contact: Dearinger,[name] Address: [address]          [place], [place] [address] [name] of [place] Phone: [phone] Mobile Phone: [phone] Relation: Son Secondary Emergency Contact: [place] of [place] Phone: [phone] Relation: Daughter    Allergies: Ambien [zolpidem tartrate]; Codeine phosphate; Darvocet [propoxyphene n-acetaminophen]; Promethazine hcl; Sulfamethoxazole; Vicodin [hydrocodone-acetaminophen]; Amoxicillin; and Penicillins  Chief Complaint  Patient presents with  . Medical Management of Chronic Issues    routine visit    HPI: Patient is 83 y.o. female who is being seen for routine issues of history of DVT, restless leg syndrome, and polymyalgia rheumatica.  Past Medical History:  Diagnosis Date  . Anemia 03/02/2012  . Angina pectoris, unstable ([place]) 03/02/2012  . Anxiety   . Axillary abscess   . Bradycardia 06/13/2012  . CAD (coronary artery disease)    S/p PTCA / stenting (last cath 2004, multiple LAD stents, 2 stents in the right coronary artery all patent)  . Cancer ([place])   . Complication of anesthesia   . Compression fracture of L2 ([facility] [place]) 03/12/2017  . Dementia   . Dysphagia 10/06/2012  . Dyspnea 02/03/2012  . GERD (gastroesophageal reflux disease)   . History of pulmonary embolus (PE) 03/19/2017  . HTN (hypertension)   . PMR (polymyalgia rheumatica) (HCC)   . PONV (postoperative nausea and vomiting)   . Restless leg syndrome   . 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 Started coumadin  . Weakness of both legs 06/09/2012    Past Surgical History:  Procedure Laterality Date  . BREAST LUMPECTOMY    . COLECTOMY    .  ESOPHAGOGASTRODUODENOSCOPY (EGD) WITH ESOPHAGEAL DILATION  10/10/2012   Procedure: ESOPHAGOGASTRODUODENOSCOPY (EGD) WITH ESOPHAGEAL DILATION;  Surgeon: Inda Castle, MD;  Location: [facility];  Service: Endoscopy;  Laterality: N/A;  . heart stents  x 8  . INCISION AND DRAINAGE     bilateral axillary, non specific staff  . INCISION AND DRAINAGE ABSCESS  09/28/2012   Procedure: INCISION AND DRAINAGE ABSCESS;  Surgeon: Zenovia Jarred, MD;  Location: [facility];  Service: General;  Laterality: Bilateral;  . stent     cardiac x 8 stents.    Allergies as of 10/12/2017      Reactions   Ambien [zolpidem Tartrate] Nausea And Vomiting   Codeine Phosphate Nausea And Vomiting   Darvocet [propoxyphene N-acetaminophen] Nausea And Vomiting   Promethazine Hcl Nausea And Vomiting   Sulfamethoxazole Other (See Comments)   Pt doesn't remember reaction   Vicodin [hydrocodone-acetaminophen] Other (See Comments)   Unknown reaction   Amoxicillin Nausea And Vomiting, Rash   Penicillins Nausea And Vomiting, Rash      Medication List        Accurate as of 10/12/17 11:59 PM. Always use your most recent med list.          ALPRAZolam 0.5 MG tablet Commonly known as:  XANAX Take 0.5 mg by mouth daily.   amLODipine 10 MG tablet Commonly known as:  NORVASC Take 10 mg by mouth every morning.   aspirin EC 81 MG tablet Take 1 tablet (81 mg total) by mouth daily.   atorvastatin 10 MG tablet Commonly known as:  LIPITOR  Take 10 mg by mouth every evening.   cholecalciferol 1000 units tablet Commonly known as:  VITAMIN D Take 2,000 Units by mouth daily with lunch.   donepezil 10 MG tablet Commonly known as:  ARICEPT Take 10 mg by mouth every morning.   fluticasone 50 MCG/ACT nasal spray Commonly known as:  FLONASE Place 1 spray into both nostrils daily as needed for allergies or rhinitis.   hydrALAZINE 25 MG tablet Commonly known as:  APRESOLINE Take 1 tablet (25 mg total) by mouth every 8  (eight) hours.   losartan 50 MG tablet Commonly known as:  COZAAR Take 50 mg by mouth at bedtime.   mirtazapine 15 MG tablet Commonly known as:  REMERON Take 7.5 mg by mouth at bedtime.   nitroGLYCERIN 0.4 MG SL tablet Commonly known as:  NITROSTAT Place 0.4 mg under the tongue every 5 (five) minutes as needed for chest pain.   ondansetron 4 MG tablet Commonly known as:  ZOFRAN Take 4 mg by mouth every 4 (four) hours as needed for nausea.   oxybutynin 5 MG tablet Commonly known as:  DITROPAN Take 5 mg by mouth 2 (two) times daily.   pantoprazole 40 MG tablet Commonly known as:  PROTONIX Take 40 mg by mouth 2 (two) times daily as needed (for heartburn).   potassium chloride SA 20 MEQ tablet Commonly known as:  K-DUR,KLOR-CON Take 20 mEq by mouth daily with lunch.   predniSONE 1 MG tablet Commonly known as:  DELTASONE Take 1 mg by mouth. Take one tablet daily with breakfast   PROSTATE PO Take 30 mLs by mouth 3 (three) times daily. Take 30 ml three times daily   rOPINIRole 1 MG tablet Commonly known as:  REQUIP Take 1 mg by mouth. 1mg  at noon, 1mg  2 hours before bed, then 1mg  at bedtime   sertraline 25 MG tablet Commonly known as:  ZOLOFT Take 25 mg by mouth every morning.   SILENOR 6 MG Tabs Generic drug:  Doxepin HCl Take 6 mg by mouth at bedtime.   warfarin 2 MG tablet Commonly known as:  COUMADIN Take 2 mg by mouth. Take one tablet daily       No orders of the defined types were placed in this encounter.   Immunization History  Administered Date(s) Administered  . Influenza-Unspecified 08/01/2016, 08/11/2017  . PPD Test 03/18/2017  . Pneumococcal Conjugate-13 09/17/2017    Social History   Tobacco Use  . Smoking status: Never Smoker  . Smokeless tobacco: Never Used  Substance Use Topics  . Alcohol use: No    Review of Systems  DATA OBTAINED: from patient, nurse GENERAL:  no fevers, fatigue, appetite changes SKIN: No itching, rash HEENT:  No complaint RESPIRATORY: No cough, wheezing, SOB CARDIAC: No chest pain, palpitations, lower extremity edema  GI: No abdominal pain, No N/V/D or constipation, No heartburn or reflux  GU: No dysuria, frequency or urgency, or incontinence  MUSCULOSKELETAL: No unrelieved bone/joint pain NEUROLOGIC: No headache, dizziness  PSYCHIATRIC: No overt anxiety or sadness  Vitals:   10/12/17 1540  BP: 130/78  Pulse: 78  Resp: 18  Temp: 98 F (36.7 C)  SpO2: 95%   Body mass index is 24.64 kg/m. Physical Exam  GENERAL APPEARANCE: Alert, conversant, No acute distress  SKIN: No diaphoresis rash HEENT: Unremarkable RESPIRATORY: Breathing is even, unlabored. Lung sounds are clear   CARDIOVASCULAR: Heart RRR no murmurs, rubs or gallops. No peripheral edema  GASTROINTESTINAL: Abdomen is soft, non-tender, not distended w/  normal bowel sounds.  GENITOURINARY: Bladder non tender, not distended  MUSCULOSKELETAL: No abnormal joints or musculature NEUROLOGIC: Cranial nerves 2-12 grossly intact. Moves all extremities PSYCHIATRIC: Mood and affect with dementia no behavioral issues  Patient Active Problem List   Diagnosis Date Noted  . Overactive bladder 09/24/2017  . Insomnia 07/31/2017  . Anxiety 06/16/2017  . Citrobacter infection 03/19/2017  . AKI (acute kidney injury) ([facility]) 03/19/2017  . History of pulmonary embolus (PE) 03/19/2017  . Elevated INR 03/19/2017  . Candidal intertrigo 03/19/2017  . Compression fracture of L2 ([facility]) 03/12/2017  . Acute lower UTI 03/12/2017  . Accelerated hypertension 03/12/2017  . Leukocytosis 07/31/2015  . Acute bronchitis 07/28/2015  . Chronic anticoagulation 07/28/2015  . Intractable back pain 07/20/2015  . Back pain 07/20/2015  . Cellulitis and abscess of left buttock 04/12/2013  . Stricture and stenosis of esophagus 10/10/2012  . Dementia 10/06/2012  . Dysphagia 10/06/2012  . Axillary abscess - bilateral, multiple 09/27/2012  . Hypotension 08/24/2012    . Near syncope 08/24/2012  . Abscess of axilla, left 06/27/2012  . Bradycardia 06/13/2012  . Abscess 06/11/2012  . Chest pain 06/09/2012  . Cellulitis 06/09/2012  . Weakness of both legs 06/09/2012  . PMR (polymyalgia rheumatica) (HCC)   . Restless leg syndrome   . Angina pectoris, unstable ([facility]) 03/02/2012  . Anemia 03/02/2012  . Dyspnea 02/03/2012  . UTI (urinary tract infection) 02/03/2012  . CAD (coronary artery disease)   . HTN (hypertension)   . VTE (venous thromboembolism) 10/01/2010  . HYPERCHOLESTEROLEMIA  IIA 08/27/2009  . Hyperlipidemia 12/11/2008  . HYPERTENSION, BENIGN 12/11/2008    CMP     Component Value Date/Time   NA 139 09/27/2017   K 4.5 09/27/2017   CL 111 03/16/2017 0400   CO2 21 (L) 03/16/2017 0400   GLUCOSE 119 (H) 03/16/2017 0400   BUN 24 (A) 09/27/2017   CREATININE 1.0 09/27/2017   CREATININE 1.03 (H) 03/16/2017 0400   CALCIUM 8.7 (L) 03/16/2017 0400   PROT 6.7 03/11/2017 2031   ALBUMIN 3.4 (L) 03/11/2017 2031   AST 13 09/27/2017   ALT 9 09/27/2017   ALKPHOS 59 09/27/2017   BILITOT 0.5 03/11/2017 2031   GFRNONAA 45 (L) 03/16/2017 0400   GFRAA 53 (L) 03/16/2017 0400   Recent Labs    03/14/17 0351  03/15/17 0402  03/16/17 0400 03/21/17 09/27/17  NA 137   < > 137   < > 139 137 139  K 4.1   < > 4.1  --  4.0 4.5 4.5  CL 107  --  110  --  111  --   --   CO2 20*  --  19*  --  21*  --   --   GLUCOSE 98  --  100*  --  119*  --   --   BUN 16   < > 18   < > 17 23* 24*  CREATININE 1.22*   < > 1.15*   < > 1.03* 1.0 1.0  CALCIUM 8.3*  --  8.4*  --  8.7*  --   --    < > = values in this interval not displayed.   Recent Labs    03/11/17 03/11/17 2031 09/27/17  AST 17 17 13   ALT 14 14 9   ALKPHOS 60 60 59  BILITOT  --  0.5  --   PROT  --  6.7  --   ALBUMIN  --  3.4*  --  Recent Labs    03/11/17 2031  03/12/17 0437  03/13/17 0255  03/15/17 0402 03/21/17 09/27/17  WBC 11.2*   < > 9.5   < > 10.1   < > 7.7 9.0 6.7  NEUTROABS 8.2*  --    --   --  6.5  --  4.2  --   --   HGB 12.0   < > 10.8*   < > 12.7   < > 10.3* 10.2* 10.0*  HCT 36.6   < > 33.0*   < > 38.6   < > 32.1* 34* 32*  MCV 84.7  --  84.4  --  84.5  --  85.4  --   --   PLT 276   < > 264   < > 299   < > 290 309 250   < > = values in this interval not displayed.   Recent Labs    09/27/17  CHOL 129  LDLCALC 71  TRIG 111   No results found for: [facility] Lab Results  Component Value Date   TSH 1.35 09/27/2017   Lab Results  Component Value Date   HGBA1C 5.5 09/27/2017   Lab Results  Component Value Date   CHOL 129 09/27/2017   HDL 36 09/27/2017   LDLCALC 71 09/27/2017   TRIG 111 09/27/2017   CHOLHDL 2.4 03/03/2012    Significant Diagnostic Results in last 30 days:  No results found.  Assessment and Plan  History of pulmonary embolus (PE) Stable; patient on Coumadin for prophylaxis; actively titrated  Restless leg syndrome Stable and controlled; continue Requip 1 mg by mouth 3 times a day  PMR (polymyalgia rheumatica) Stable; continue prednisone 1 mg by mouth daily     Melek Pownall D. [name], MD

## 2017-11-09 ENCOUNTER — Non-Acute Institutional Stay (SKILLED_NURSING_FACILITY): Payer: Medicare Other | Admitting: Internal Medicine

## 2017-11-09 ENCOUNTER — Encounter: Payer: Self-pay | Admitting: Internal Medicine

## 2017-11-09 DIAGNOSIS — I2510 Atherosclerotic heart disease of native coronary artery without angina pectoris: Secondary | ICD-10-CM

## 2017-11-09 DIAGNOSIS — I8290 Acute embolism and thrombosis of unspecified vein: Secondary | ICD-10-CM | POA: Diagnosis not present

## 2017-11-09 DIAGNOSIS — I10 Essential (primary) hypertension: Secondary | ICD-10-CM

## 2017-11-09 NOTE — Progress Notes (Signed)
Location:  [facility] Room Number: 204-D Place of Service:  SNF (31)  Hennie Duos, MD  Patient Care Team: Hennie Duos, MD as PCP - General (Internal Medicine)  Extended Emergency Contact Information Primary Emergency Contact: Profit,[name] Address: [address]          [place], [place] [address] [name] of [place] Phone: [phone] Mobile Phone: [phone] Relation: Son Secondary Emergency Contact: [place] of [place] Phone: [phone] Relation: Daughter    Allergies: Ambien [zolpidem tartrate]; Codeine phosphate; Darvocet [propoxyphene n-acetaminophen]; Promethazine hcl; Sulfamethoxazole; Vicodin [hydrocodone-acetaminophen]; Amoxicillin; and Penicillins  Chief Complaint  Patient presents with  . Medical Management of Chronic Issues    Routine Visit     HPI: Patient is 84 y.o. female who is being seen for routine issues of hypertension, coronary artery disease, and history of PE/DVT.  Past Medical History:  Diagnosis Date  . Anemia 03/02/2012  . Angina pectoris, unstable ([facility]) 03/02/2012  . Anxiety   . Axillary abscess   . Bradycardia 06/13/2012  . CAD (coronary artery disease)    S/p PTCA / stenting (last cath 2004, multiple LAD stents, 2 stents in the right coronary artery all patent)  . Cancer ([facility])   . Complication of anesthesia   . Compression fracture of L2 ([facility] [facility]) 03/12/2017  . Dementia   . Dysphagia 10/06/2012  . Dyspnea 02/03/2012  . GERD (gastroesophageal reflux disease)   . History of pulmonary embolus (PE) 03/19/2017  . HTN (hypertension)   . PMR (polymyalgia rheumatica) (HCC)   . PONV (postoperative nausea and vomiting)   . Restless leg syndrome   . 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 Started coumadin  . Weakness of both legs 06/09/2012    Past Surgical History:  Procedure Laterality Date  . BREAST LUMPECTOMY    . COLECTOMY    .  ESOPHAGOGASTRODUODENOSCOPY (EGD) WITH ESOPHAGEAL DILATION  10/10/2012   Procedure: ESOPHAGOGASTRODUODENOSCOPY (EGD) WITH ESOPHAGEAL DILATION;  Surgeon: Inda Castle, MD;  Location: [facility];  Service: Endoscopy;  Laterality: N/A;  . heart stents  x 8  . INCISION AND DRAINAGE     bilateral axillary, non specific staff  . INCISION AND DRAINAGE ABSCESS  09/28/2012   Procedure: INCISION AND DRAINAGE ABSCESS;  Surgeon: Zenovia Jarred, MD;  Location: [facility];  Service: General;  Laterality: Bilateral;  . stent     cardiac x 8 stents.    Allergies as of 11/09/2017      Reactions   Ambien [zolpidem Tartrate] Nausea And Vomiting   Codeine Phosphate Nausea And Vomiting   Darvocet [propoxyphene N-acetaminophen] Nausea And Vomiting   Promethazine Hcl Nausea And Vomiting   Sulfamethoxazole Other (See Comments)   Pt doesn't remember reaction   Vicodin [hydrocodone-acetaminophen] Other (See Comments)   Unknown reaction   Amoxicillin Nausea And Vomiting, Rash   Penicillins Nausea And Vomiting, Rash      Medication List        Accurate as of 11/09/17 11:59 PM. Always use your most recent med list.          ALPRAZolam 0.5 MG tablet Commonly known as:  XANAX Take 0.5 mg by mouth daily.   amLODipine 10 MG tablet Commonly known as:  NORVASC Take 10 mg by mouth every morning.   aspirin EC 81 MG tablet Take 1 tablet (81 mg total) by mouth daily.   atorvastatin 10 MG tablet Commonly known as:  LIPITOR  Take 10 mg by mouth every evening.   cholecalciferol 1000 units tablet Commonly known as:  VITAMIN D Take 2,000 Units by mouth daily with lunch.   donepezil 10 MG tablet Commonly known as:  ARICEPT Take 10 mg by mouth every morning.   feeding supplement (PRO-STAT SUGAR FREE 64) Liqd Take 30 mLs by mouth 3 (three) times daily.   fluticasone 50 MCG/ACT nasal spray Commonly known as:  FLONASE Place 1 spray into both nostrils daily as needed for allergies or rhinitis.   hydrALAZINE  25 MG tablet Commonly known as:  APRESOLINE Take 1 tablet (25 mg total) by mouth every 8 (eight) hours.   losartan 50 MG tablet Commonly known as:  COZAAR Take 50 mg by mouth at bedtime.   mirtazapine 15 MG tablet Commonly known as:  REMERON Take 7.5 mg by mouth at bedtime.   nitroGLYCERIN 0.4 MG SL tablet Commonly known as:  NITROSTAT Place 0.4 mg under the tongue every 5 (five) minutes as needed for chest pain.   ondansetron 4 MG tablet Commonly known as:  ZOFRAN Take 4 mg by mouth every 4 (four) hours as needed for nausea.   oxybutynin 5 MG tablet Commonly known as:  DITROPAN Take 5 mg by mouth 2 (two) times daily.   pantoprazole 40 MG tablet Commonly known as:  PROTONIX Take 40 mg by mouth 2 (two) times daily as needed (for heartburn).   potassium chloride SA 20 MEQ tablet Commonly known as:  K-DUR,KLOR-CON Take 20 mEq by mouth daily with lunch.   predniSONE 1 MG tablet Commonly known as:  DELTASONE Take 1 mg by mouth. Take one tablet daily with breakfast   rOPINIRole 1 MG tablet Commonly known as:  REQUIP Take 1 mg by mouth. 1mg  at noon, 1mg  2 hours before bed, then 1mg  at bedtime   sertraline 25 MG tablet Commonly known as:  ZOLOFT Take 25 mg by mouth every morning.   SILENOR 6 MG Tabs Generic drug:  Doxepin HCl Take 6 mg by mouth at bedtime.   traMADol 50 MG tablet Commonly known as:  ULTRAM Take 50 mg by mouth at bedtime.   warfarin 2.5 MG tablet Commonly known as:  COUMADIN Take 2.5 mg by mouth daily.       No orders of the defined types were placed in this encounter.   Immunization History  Administered Date(s) Administered  . Influenza-Unspecified 08/01/2016, 08/11/2017  . PPD Test 03/18/2017  . Pneumococcal Conjugate-13 09/17/2017    Social History   Tobacco Use  . Smoking status: Never Smoker  . Smokeless tobacco: Never Used  Substance Use Topics  . Alcohol use: No    Review of Systems  DATA OBTAINED: from patient,  nurse GENERAL:  no fevers, fatigue, appetite changes SKIN: No itching, rash HEENT: No complaint RESPIRATORY: No cough, wheezing, SOB CARDIAC: No chest pain, palpitations, lower extremity edema  GI: No abdominal pain, No N/V/D or constipation, No heartburn or reflux  GU: No dysuria, frequency or urgency, or incontinence  MUSCULOSKELETAL: No unrelieved bone/joint pain NEUROLOGIC: No headache, dizziness  PSYCHIATRIC: No overt anxiety or sadness  Vitals:   11/09/17 1201  BP: 107/65  Pulse: 79  Resp: 16  Temp: (!) 97.1 F (36.2 C)  SpO2: 99%   Body mass index is 24.41 kg/m. Physical Exam  GENERAL APPEARANCE: Alert, conversant, No acute distress  SKIN: No diaphoresis rash HEENT: Unremarkable RESPIRATORY: Breathing is even, unlabored. Lung sounds are clear   CARDIOVASCULAR: Heart RRR no murmurs,  rubs or gallops. No peripheral edema  GASTROINTESTINAL: Abdomen is soft, non-tender, not distended w/ normal bowel sounds.  GENITOURINARY: Bladder non tender, not distended  MUSCULOSKELETAL: No abnormal joints or musculature NEUROLOGIC: Cranial nerves 2-12 grossly intact. Moves all extremities PSYCHIATRIC: Mood and affect appropriate to situation with dementia, no behavioral issues  Patient Active Problem List   Diagnosis Date Noted  . Overactive bladder 09/24/2017  . Insomnia 07/31/2017  . Anxiety 06/16/2017  . Citrobacter infection 03/19/2017  . AKI (acute kidney injury) ([place]) 03/19/2017  . History of pulmonary embolus (PE) 03/19/2017  . Elevated INR 03/19/2017  . Candidal intertrigo 03/19/2017  . Compression fracture of L2 ([place]) 03/12/2017  . Acute lower UTI 03/12/2017  . Accelerated hypertension 03/12/2017  . Leukocytosis 07/31/2015  . Acute bronchitis 07/28/2015  . Chronic anticoagulation 07/28/2015  . Intractable back pain 07/20/2015  . Back pain 07/20/2015  . Cellulitis and abscess of left buttock 04/12/2013  . Stricture and stenosis of esophagus 10/10/2012  . Dementia  10/06/2012  . Dysphagia 10/06/2012  . Axillary abscess - bilateral, multiple 09/27/2012  . Hypotension 08/24/2012  . Near syncope 08/24/2012  . Abscess of axilla, left 06/27/2012  . Bradycardia 06/13/2012  . Abscess 06/11/2012  . Chest pain 06/09/2012  . Cellulitis 06/09/2012  . Weakness of both legs 06/09/2012  . PMR (polymyalgia rheumatica) (HCC)   . Restless leg syndrome   . Angina pectoris, unstable ([place]) 03/02/2012  . Anemia 03/02/2012  . Dyspnea 02/03/2012  . UTI (urinary tract infection) 02/03/2012  . CAD (coronary artery disease)   . HTN (hypertension)   . VTE (venous thromboembolism) 10/01/2010  . HYPERCHOLESTEROLEMIA  IIA 08/27/2009  . Hyperlipidemia 12/11/2008  . HYPERTENSION, BENIGN 12/11/2008    CMP     Component Value Date/Time   NA 139 09/27/2017   K 4.5 09/27/2017   CL 111 03/16/2017 0400   CO2 21 (L) 03/16/2017 0400   GLUCOSE 119 (H) 03/16/2017 0400   BUN 24 (A) 09/27/2017   CREATININE 1.0 09/27/2017   CREATININE 1.03 (H) 03/16/2017 0400   CALCIUM 8.7 (L) 03/16/2017 0400   PROT 6.7 03/11/2017 2031   ALBUMIN 3.4 (L) 03/11/2017 2031   AST 13 09/27/2017   ALT 9 09/27/2017   ALKPHOS 59 09/27/2017   BILITOT 0.5 03/11/2017 2031   GFRNONAA 45 (L) 03/16/2017 0400   GFRAA 53 (L) 03/16/2017 0400   Recent Labs    03/14/17 0351  03/15/17 0402  03/16/17 0400 03/21/17 09/27/17  NA 137   < > 137   < > 139 137 139  K 4.1   < > 4.1  --  4.0 4.5 4.5  CL 107  --  110  --  111  --   --   CO2 20*  --  19*  --  21*  --   --   GLUCOSE 98  --  100*  --  119*  --   --   BUN 16   < > 18   < > 17 23* 24*  CREATININE 1.22*   < > 1.15*   < > 1.03* 1.0 1.0  CALCIUM 8.3*  --  8.4*  --  8.7*  --   --    < > = values in this interval not displayed.   Recent Labs    03/11/17 03/11/17 2031 09/27/17  AST 17 17 13   ALT 14 14 9   ALKPHOS 60 60 59  BILITOT  --  0.5  --   PROT  --  6.7  --   ALBUMIN  --  3.4*  --    Recent Labs    03/11/17 2031  03/12/17 0437   03/13/17 0255  03/15/17 0402 03/21/17 09/27/17  WBC 11.2*   < > 9.5   < > 10.1   < > 7.7 9.0 6.7  NEUTROABS 8.2*  --   --   --  6.5  --  4.2  --   --   HGB 12.0   < > 10.8*   < > 12.7   < > 10.3* 10.2* 10.0*  HCT 36.6   < > 33.0*   < > 38.6   < > 32.1* 34* 32*  MCV 84.7  --  84.4  --  84.5  --  85.4  --   --   PLT 276   < > 264   < > 299   < > 290 309 250   < > = values in this interval not displayed.   Recent Labs    09/27/17  CHOL 129  LDLCALC 71  TRIG 111   No results found for: [facility] Lab Results  Component Value Date   TSH 1.35 09/27/2017   Lab Results  Component Value Date   HGBA1C 5.5 09/27/2017   Lab Results  Component Value Date   CHOL 129 09/27/2017   HDL 36 09/27/2017   LDLCALC 71 09/27/2017   TRIG 111 09/27/2017   CHOLHDL 2.4 03/03/2012    Significant Diagnostic Results in last 30 days:  No results found.  Assessment and Plan  CAD (coronary artery disease) No reported chest pain; continue ASA 81 mg by mouth daily, Coumadin, statin, and sublingual nitroglycerin when necessary  HTN (hypertension) Controlled; continue Norvasc 10 mg by mouth daily, hydralazine 25 mg by mouth 3 times a day, and Cozaar 50 mg by mouth daily  VTE (venous thromboembolism) Chronic; patient continues on Coumadin which is actively titrated   Inocencio Homes MD

## 2017-11-19 ENCOUNTER — Encounter: Payer: Self-pay | Admitting: Internal Medicine

## 2017-11-19 NOTE — Assessment & Plan Note (Signed)
Stable and controlled; continue Requip 1 mg by mouth 3 times a day

## 2017-11-19 NOTE — Assessment & Plan Note (Signed)
Stable; continue prednisone 1 mg by mouth daily

## 2017-11-19 NOTE — Assessment & Plan Note (Signed)
Stable; patient on Coumadin for prophylaxis; actively titrated

## 2017-11-24 DIAGNOSIS — F329 Major depressive disorder, single episode, unspecified: Secondary | ICD-10-CM | POA: Diagnosis not present

## 2017-11-24 DIAGNOSIS — F419 Anxiety disorder, unspecified: Secondary | ICD-10-CM | POA: Diagnosis not present

## 2017-11-24 DIAGNOSIS — F0390 Unspecified dementia without behavioral disturbance: Secondary | ICD-10-CM | POA: Diagnosis not present

## 2017-11-26 ENCOUNTER — Encounter: Payer: Self-pay | Admitting: Internal Medicine

## 2017-11-26 NOTE — Assessment & Plan Note (Signed)
Controlled; continue Norvasc 10 mg by mouth daily, hydralazine 25 mg by mouth 3 times a day, and Cozaar 50 mg by mouth daily

## 2017-11-26 NOTE — Assessment & Plan Note (Signed)
Chronic; patient continues on Coumadin which is actively titrated

## 2017-11-26 NOTE — Assessment & Plan Note (Signed)
No reported chest pain; continue ASA 81 mg by mouth daily, Coumadin, statin, and sublingual nitroglycerin when necessary

## 2017-11-30 ENCOUNTER — Encounter: Payer: Self-pay | Admitting: Internal Medicine

## 2017-11-30 NOTE — Progress Notes (Signed)
Opened in error

## 2017-12-05 DIAGNOSIS — M545 Low back pain: Secondary | ICD-10-CM | POA: Diagnosis not present

## 2017-12-07 ENCOUNTER — Non-Acute Institutional Stay (SKILLED_NURSING_FACILITY): Payer: Medicare Other | Admitting: Internal Medicine

## 2017-12-07 ENCOUNTER — Encounter: Payer: Self-pay | Admitting: Internal Medicine

## 2017-12-07 DIAGNOSIS — F5101 Primary insomnia: Secondary | ICD-10-CM | POA: Diagnosis not present

## 2017-12-07 DIAGNOSIS — F0280 Dementia in other diseases classified elsewhere without behavioral disturbance: Secondary | ICD-10-CM

## 2017-12-07 DIAGNOSIS — G301 Alzheimer's disease with late onset: Secondary | ICD-10-CM

## 2017-12-07 DIAGNOSIS — N3281 Overactive bladder: Secondary | ICD-10-CM | POA: Diagnosis not present

## 2017-12-07 NOTE — Progress Notes (Signed)
: Provider:   Location:  [facility] Room Number: 062B Place of Service:  SNF [phone])  PCP: Hennie Duos, MD Patient Care Team: Hennie Duos, MD as PCP - General (Internal Medicine)  Extended Emergency Contact Information Primary Emergency Contact: Depner,[name] Address: [address]          [place], [place] [address] [name] Phone: [phone] Mobile Phone: [phone] Relation: Son Secondary Emergency Contact: [place] Phone: [phone] Relation: Daughter     Allergies: Ambien [zolpidem tartrate]; Codeine phosphate; Darvocet [propoxyphene n-acetaminophen]; Promethazine hcl; Sulfamethoxazole; Vicodin [hydrocodone-acetaminophen]; Amoxicillin; and Penicillins  Chief Complaint  Patient presents with  . Medical Management of Chronic Issues    HPI: Patient is 84 y.o. female who is being seen for routine issues of dementia, overactive bladder, and insomnia.  Past Medical History:  Diagnosis Date  . Anemia 03/02/2012  . Angina pectoris, unstable ([facility]) 03/02/2012  . Anxiety   . Axillary abscess   . Bradycardia 06/13/2012  . CAD (coronary artery disease)    S/p PTCA / stenting (last cath 2004, multiple LAD stents, 2 stents in the right coronary artery all patent)  . Cancer ([place] [facility])   . Complication of anesthesia   . Compression fracture of L2 ([facility]) 03/12/2017  . Dementia   . Dysphagia 10/06/2012  . Dyspnea 02/03/2012  . GERD (gastroesophageal reflux disease)   . History of pulmonary embolus (PE) 03/19/2017  . HTN (hypertension)   . PMR (polymyalgia rheumatica) (HCC)   . PONV (postoperative nausea and vomiting)   . Restless leg syndrome   . 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 Started coumadin  . Weakness of both legs 06/09/2012    Past Surgical History:  Procedure Laterality Date  . BREAST LUMPECTOMY    . COLECTOMY    . ESOPHAGOGASTRODUODENOSCOPY (EGD) WITH  ESOPHAGEAL DILATION  10/10/2012   Procedure: ESOPHAGOGASTRODUODENOSCOPY (EGD) WITH ESOPHAGEAL DILATION;  Surgeon: Inda Castle, MD;  Location: [facility];  Service: Endoscopy;  Laterality: N/A;  . heart stents  x 8  . INCISION AND DRAINAGE     bilateral axillary, non specific staff  . INCISION AND DRAINAGE ABSCESS  09/28/2012   Procedure: INCISION AND DRAINAGE ABSCESS;  Surgeon: Zenovia Jarred, MD;  Location: [facility];  Service: General;  Laterality: Bilateral;  . stent     cardiac x 8 stents.    Allergies as of 12/07/2017      Reactions   Ambien [zolpidem Tartrate] Nausea And Vomiting   Codeine Phosphate Nausea And Vomiting   Darvocet [propoxyphene N-acetaminophen] Nausea And Vomiting   Promethazine Hcl Nausea And Vomiting   Sulfamethoxazole Other (See Comments)   Pt doesn't remember reaction   Vicodin [hydrocodone-acetaminophen] Other (See Comments)   Unknown reaction   Amoxicillin Nausea And Vomiting, Rash   Penicillins Nausea And Vomiting, Rash      Medication List        Accurate as of 12/07/17 11:59 PM. Always use your most recent med list.          ALPRAZolam 0.5 MG tablet Commonly known as:  XANAX Take 0.5 mg by mouth daily.   amLODipine 10 MG tablet Commonly known as:  NORVASC Take 10 mg by mouth every morning.   aspirin EC 81 MG tablet Take 1 tablet (81 mg total) by mouth daily.   atorvastatin 10 MG tablet Commonly known as:  LIPITOR Take 10 mg by  mouth every evening.   cholecalciferol 1000 units tablet Commonly known as:  VITAMIN D Take 2,000 Units by mouth daily with lunch.   donepezil 10 MG tablet Commonly known as:  ARICEPT Take 10 mg by mouth every morning.   feeding supplement (PRO-STAT SUGAR FREE 64) Liqd Take 30 mLs by mouth 3 (three) times daily.   fluticasone 50 MCG/ACT nasal spray Commonly known as:  FLONASE Place 1 spray into both nostrils daily as needed for allergies or rhinitis.   hydrALAZINE 25 MG tablet Commonly known as:   APRESOLINE Take 1 tablet (25 mg total) by mouth every 8 (eight) hours.   losartan 50 MG tablet Commonly known as:  COZAAR Take 50 mg by mouth at bedtime.   mirtazapine 15 MG tablet Commonly known as:  REMERON Take 7.5 mg by mouth at bedtime.   nitroGLYCERIN 0.4 MG SL tablet Commonly known as:  NITROSTAT Place 0.4 mg under the tongue every 5 (five) minutes as needed for chest pain.   ondansetron 4 MG tablet Commonly known as:  ZOFRAN Take 4 mg by mouth every 4 (four) hours as needed for nausea.   oxybutynin 5 MG tablet Commonly known as:  DITROPAN Take 5 mg by mouth 2 (two) times daily.   pantoprazole 40 MG tablet Commonly known as:  PROTONIX Take 40 mg by mouth 2 (two) times daily as needed (for heartburn).   potassium chloride SA 20 MEQ tablet Commonly known as:  K-DUR,KLOR-CON Take 20 mEq by mouth daily with lunch.   predniSONE 1 MG tablet Commonly known as:  DELTASONE Take 1 mg by mouth. Take one tablet daily with breakfast   rOPINIRole 1 MG tablet Commonly known as:  REQUIP Take 1 mg by mouth. 1mg  at noon, 1mg  2 hours before bed, then 1mg  at bedtime   sertraline 25 MG tablet Commonly known as:  ZOLOFT Take 25 mg by mouth every morning.   SILENOR 6 MG Tabs Generic drug:  Doxepin HCl Take 6 mg by mouth at bedtime.   traMADol 50 MG tablet Commonly known as:  ULTRAM Take 50 mg by mouth at bedtime.   warfarin 2 MG tablet Commonly known as:  COUMADIN Take 2 mg by mouth daily.       No orders of the defined types were placed in this encounter.   Immunization History  Administered Date(s) Administered  . Influenza-Unspecified 08/01/2016, 08/11/2017  . PPD Test 03/18/2017  . Pneumococcal Conjugate-13 09/17/2017    Social History   Tobacco Use  . Smoking status: Never Smoker  . Smokeless tobacco: Never Used  Substance Use Topics  . Alcohol use: No    Family history is   Family History  Problem Relation Age of Onset  . Heart failure Mother     . Stroke Father   . Breast cancer Sister   . Heart disease Sister   . Heart disease Brother       Review of Systems  DATA OBTAINED: from patient, nurse GENERAL:  no fevers, fatigue, appetite changes SKIN: No itching, or rash EYES: No eye pain, redness, discharge EARS: No earache, tinnitus, change in hearing NOSE: No congestion, drainage or bleeding  MOUTH/THROAT: No mouth or tooth pain, No sore throat RESPIRATORY: No cough, wheezing, SOB CARDIAC: No chest pain, palpitations, lower extremity edema  GI: No abdominal pain, No N/V/D or constipation, No heartburn or reflux  GU: No dysuria, frequency or urgency, or incontinence  MUSCULOSKELETAL: No unrelieved bone/joint pain NEUROLOGIC: No headache, dizziness or focal  weakness PSYCHIATRIC: No c/o anxiety or sadness   Vitals:   12/07/17 1058  BP: 138/64  Pulse: 64  Resp: 16  Temp: 97.8 F (36.6 C)    SpO2 Readings from Last 1 Encounters:  11/09/17 99%   Body mass index is 24.98 kg/m.     Physical Exam  GENERAL APPEARANCE: Alert, conversant,  No acute distress.  SKIN: No diaphoresis rash HEAD: Normocephalic, atraumatic  EYES: Conjunctiva/lids clear. Pupils round, reactive. EOMs intact.  EARS: External exam WNL, canals clear. Hearing grossly normal.  NOSE: No deformity or discharge.  MOUTH/THROAT: Lips w/o lesions  RESPIRATORY: Breathing is even, unlabored. Lung sounds are clear   CARDIOVASCULAR: Heart RRR no murmurs, rubs or gallops. No peripheral edema.   GASTROINTESTINAL: Abdomen is soft, non-tender, not distended w/ normal bowel sounds. GENITOURINARY: Bladder non tender, not distended  MUSCULOSKELETAL: No abnormal joints or musculature NEUROLOGIC:  Cranial nerves 2-12 grossly intact. Moves all extremities  PSYCHIATRIC: Mood and affect with dementia, no behavioral issues  Patient Active Problem List   Diagnosis Date Noted  . Overactive bladder 09/24/2017  . Insomnia 07/31/2017  . Anxiety 06/16/2017  .  Citrobacter infection 03/19/2017  . AKI (acute kidney injury) ([facility]) 03/19/2017  . History of pulmonary embolus (PE) 03/19/2017  . Elevated INR 03/19/2017  . Candidal intertrigo 03/19/2017  . Compression fracture of L2 ([facility]) 03/12/2017  . Acute lower UTI 03/12/2017  . Accelerated hypertension 03/12/2017  . Leukocytosis 07/31/2015  . Acute bronchitis 07/28/2015  . Chronic anticoagulation 07/28/2015  . Intractable back pain 07/20/2015  . Back pain 07/20/2015  . Cellulitis and abscess of left buttock 04/12/2013  . Stricture and stenosis of esophagus 10/10/2012  . Dementia 10/06/2012  . Dysphagia 10/06/2012  . Axillary abscess - bilateral, multiple 09/27/2012  . Hypotension 08/24/2012  . Near syncope 08/24/2012  . Abscess of axilla, left 06/27/2012  . Bradycardia 06/13/2012  . Abscess 06/11/2012  . Chest pain 06/09/2012  . Cellulitis 06/09/2012  . Weakness of both legs 06/09/2012  . PMR (polymyalgia rheumatica) (HCC)   . Restless leg syndrome   . Angina pectoris, unstable ([facility]) 03/02/2012  . Anemia 03/02/2012  . Dyspnea 02/03/2012  . UTI (urinary tract infection) 02/03/2012  . CAD (coronary artery disease)   . HTN (hypertension)   . VTE (venous thromboembolism) 10/01/2010  . HYPERCHOLESTEROLEMIA  IIA 08/27/2009  . Hyperlipidemia 12/11/2008  . HYPERTENSION, BENIGN 12/11/2008      Labs reviewed: Basic Metabolic Panel:    Component Value Date/Time   NA 139 09/27/2017   K 4.5 09/27/2017   CL 111 03/16/2017 0400   CO2 21 (L) 03/16/2017 0400   GLUCOSE 119 (H) 03/16/2017 0400   BUN 24 (A) 09/27/2017   CREATININE 1.0 09/27/2017   CREATININE 1.03 (H) 03/16/2017 0400   CALCIUM 8.7 (L) 03/16/2017 0400   PROT 6.7 03/11/2017 2031   ALBUMIN 3.4 (L) 03/11/2017 2031   AST 13 09/27/2017   ALT 9 09/27/2017   ALKPHOS 59 09/27/2017   BILITOT 0.5 03/11/2017 2031   GFRNONAA 45 (L) 03/16/2017 0400   GFRAA 53 (L) 03/16/2017 0400    Recent Labs    03/14/17 0351  03/15/17 0402   03/16/17 0400 03/21/17 09/27/17  NA 137   < > 137   < > 139 137 139  K 4.1   < > 4.1  --  4.0 4.5 4.5  CL 107  --  110  --  111  --   --   CO2 20*  --  19*  --  21*  --   --   GLUCOSE 98  --  100*  --  119*  --   --   BUN 16   < > 18   < > 17 23* 24*  CREATININE 1.22*   < > 1.15*   < > 1.03* 1.0 1.0  CALCIUM 8.3*  --  8.4*  --  8.7*  --   --    < > = values in this interval not displayed.   Liver Function Tests: Recent Labs    03/11/17 03/11/17 2031 09/27/17  AST 17 17 13   ALT 14 14 9   ALKPHOS 60 60 59  BILITOT  --  0.5  --   PROT  --  6.7  --   ALBUMIN  --  3.4*  --    No results for input(s): LIPASE, AMYLASE in the last 8760 hours. No results for input(s): AMMONIA in the last 8760 hours. CBC: Recent Labs    03/11/17 2031  03/12/17 0437  03/13/17 0255  03/15/17 0402 03/21/17 09/27/17  WBC 11.2*   < > 9.5   < > 10.1   < > 7.7 9.0 6.7  NEUTROABS 8.2*  --   --   --  6.5  --  4.2  --   --   HGB 12.0   < > 10.8*   < > 12.7   < > 10.3* 10.2* 10.0*  HCT 36.6   < > 33.0*   < > 38.6   < > 32.1* 34* 32*  MCV 84.7  --  84.4  --  84.5  --  85.4  --   --   PLT 276   < > 264   < > 299   < > 290 309 250   < > = values in this interval not displayed.   Lipid Recent Labs    09/27/17  CHOL 129  HDL 36  LDLCALC 71  TRIG 111    Cardiac Enzymes: Recent Labs    03/13/17 0255  TROPONINI 0.03*   BNP: No results for input(s): BNP in the last 8760 hours. No results found for: [facility] Lab Results  Component Value Date   HGBA1C 5.5 09/27/2017   Lab Results  Component Value Date   TSH 1.35 09/27/2017   No results found for: VITAMINB12 No results found for: FOLATE Lab Results  Component Value Date   IRON 28 (L) 03/02/2012   TIBC 386 03/02/2012   FERRITIN 8 (L) 03/02/2012    Imaging and Procedures obtained prior to SNF admission: Dg Chest 2 View  Result Date: 03/11/2017 CLINICAL DATA:  Fall 4 days prior with severe low back pain. History of breast cancer. EXAM:  CHEST  2 VIEW COMPARISON:  Frontal and lateral views 05/27/2016 FINDINGS: Unchanged elevation of right hemidiaphragm allowing for differences in technique. Stable heart size and mediastinal contours. Tortuosity atherosclerosis of the thoracic aorta. Minimal bibasilar atelectasis or scarring. No consolidation, pleural effusion, pneumothorax or pulmonary edema. The bones are under mineralized. No grossly displaced rib fracture. IMPRESSION: No acute abnormality.  Chronic elevation of right hemidiaphragm. Tortuous thoracic aorta with atherosclerosis. Electronically Signed   By: Jeb Levering M.D.   On: 03/11/2017 21:29   Ct Thoracic Spine Wo Contrast  Result Date: 03/11/2017 CLINICAL DATA:  Fall with back pain.  History of breast cancer. EXAM: CT THORACIC AND LUMBAR SPINE WITHOUT CONTRAST TECHNIQUE: Multidetector CT imaging of the thoracic and lumbar spine was performed without contrast.  Multiplanar CT image reconstructions were also generated. COMPARISON:  Lumbar spine radiograph 03/08/2017 Thoracic spine CT 11/14/2015 Lumbar spine MRI 07/20/2015 FINDINGS: CT THORACIC SPINE FINDINGS Alignment: Normal Vertebrae: There are compression deformities of the T9 and T12 vertebrae, both of which are chronic and unchanged compared to 11/14/2015. Paraspinal and other soft tissues: Aortic atherosclerosis. Otherwise unremarkable. Disc levels: No bony spinal canal stenosis. Intervertebral disc spaces are maintained. No evidence of traumatic herniation. CT LUMBAR SPINE FINDINGS Segmentation: Normal Alignment: Normal Vertebrae: Compression deformity of the L3 vertebral body is unchanged compared to MRI of 07/20/2015. L2 compression deformity is new compared to this MR and the CT of 11/14/2015, but unchanged compared to the recent radiograph of 03/08/2017. There is no adjacent hematoma or soft tissue abnormality. There is gas in the disc space and underlying the endplate. Paraspinal and other soft tissues: Aortic  atherosclerosis. Disc levels: No evidence of traumatic disc herniation. No bony spinal canal stenosis. There is bilateral L4-L5 and L5-S1 neural foraminal narrowing, worst at right L4-5. IMPRESSION: 1. L2 superior endplate compression fracture is age indeterminate. Given the presence of an intravertebral vacuum cleft, this is favored to be a subacute fracture (within 2 weeks). Recommend correlation with history of trauma). 2. Chronic compression deformities at T9, T12 and L3. 3. No bony spinal canal stenosis or evidence of traumatic disc herniation. 4. Aortic atherosclerosis. Electronically Signed   By: Ulyses Jarred M.D.   On: 03/11/2017 21:29   Ct Lumbar Spine Wo Contrast  Result Date: 03/11/2017 CLINICAL DATA:  Fall with back pain.  History of breast cancer. EXAM: CT THORACIC AND LUMBAR SPINE WITHOUT CONTRAST TECHNIQUE: Multidetector CT imaging of the thoracic and lumbar spine was performed without contrast. Multiplanar CT image reconstructions were also generated. COMPARISON:  Lumbar spine radiograph 03/08/2017 Thoracic spine CT 11/14/2015 Lumbar spine MRI 07/20/2015 FINDINGS: CT THORACIC SPINE FINDINGS Alignment: Normal Vertebrae: There are compression deformities of the T9 and T12 vertebrae, both of which are chronic and unchanged compared to 11/14/2015. Paraspinal and other soft tissues: Aortic atherosclerosis. Otherwise unremarkable. Disc levels: No bony spinal canal stenosis. Intervertebral disc spaces are maintained. No evidence of traumatic herniation. CT LUMBAR SPINE FINDINGS Segmentation: Normal Alignment: Normal Vertebrae: Compression deformity of the L3 vertebral body is unchanged compared to MRI of 07/20/2015. L2 compression deformity is new compared to this MR and the CT of 11/14/2015, but unchanged compared to the recent radiograph of 03/08/2017. There is no adjacent hematoma or soft tissue abnormality. There is gas in the disc space and underlying the endplate. Paraspinal and other soft  tissues: Aortic atherosclerosis. Disc levels: No evidence of traumatic disc herniation. No bony spinal canal stenosis. There is bilateral L4-L5 and L5-S1 neural foraminal narrowing, worst at right L4-5. IMPRESSION: 1. L2 superior endplate compression fracture is age indeterminate. Given the presence of an intravertebral vacuum cleft, this is favored to be a subacute fracture (within 2 weeks). Recommend correlation with history of trauma). 2. Chronic compression deformities at T9, T12 and L3. 3. No bony spinal canal stenosis or evidence of traumatic disc herniation. 4. Aortic atherosclerosis. Electronically Signed   By: Ulyses Jarred M.D.   On: 03/11/2017 21:29     Not all labs, radiology exams or other studies done during hospitalization come through on my EPIC note; however they are reviewed by me.    Assessment and Plan  Dementia Stable without major declines; continue Aricept 10 mg by mouth daily at bedtime  Overactive bladder Stable without infections; continue Ditropan 5 mg twice  a day  Insomnia No reported problems; continue doxepin 6 mg by mouth daily at bedtime   Webb Silversmith D. [name], MD

## 2017-12-10 ENCOUNTER — Encounter: Payer: Self-pay | Admitting: Internal Medicine

## 2017-12-10 NOTE — Assessment & Plan Note (Signed)
No reported problems; continue doxepin 6 mg by mouth daily at bedtime

## 2017-12-10 NOTE — Assessment & Plan Note (Signed)
Stable without major declines; continue Aricept 10 mg by mouth daily at bedtime

## 2017-12-10 NOTE — Assessment & Plan Note (Signed)
Stable without infections; continue Ditropan 5 mg twice a day

## 2017-12-15 DIAGNOSIS — R262 Difficulty in walking, not elsewhere classified: Secondary | ICD-10-CM | POA: Diagnosis not present

## 2017-12-15 DIAGNOSIS — L603 Nail dystrophy: Secondary | ICD-10-CM | POA: Diagnosis not present

## 2017-12-15 DIAGNOSIS — I739 Peripheral vascular disease, unspecified: Secondary | ICD-10-CM | POA: Diagnosis not present

## 2017-12-20 DIAGNOSIS — F419 Anxiety disorder, unspecified: Secondary | ICD-10-CM | POA: Diagnosis not present

## 2017-12-20 DIAGNOSIS — F329 Major depressive disorder, single episode, unspecified: Secondary | ICD-10-CM | POA: Diagnosis not present

## 2017-12-20 DIAGNOSIS — F0390 Unspecified dementia without behavioral disturbance: Secondary | ICD-10-CM | POA: Diagnosis not present

## 2018-01-04 ENCOUNTER — Non-Acute Institutional Stay (SKILLED_NURSING_FACILITY): Payer: Medicare Other | Admitting: Internal Medicine

## 2018-01-04 ENCOUNTER — Encounter: Payer: Self-pay | Admitting: Internal Medicine

## 2018-01-04 DIAGNOSIS — Z86711 Personal history of pulmonary embolism: Secondary | ICD-10-CM

## 2018-01-04 DIAGNOSIS — M353 Polymyalgia rheumatica: Secondary | ICD-10-CM

## 2018-01-04 DIAGNOSIS — G2581 Restless legs syndrome: Secondary | ICD-10-CM | POA: Diagnosis not present

## 2018-01-04 NOTE — Progress Notes (Signed)
Location:  [facility] Room Number: [phone] Place of Service:  SNF (31)  Hennie Duos, MD  Patient Care Team: Hennie Duos, MD as PCP - General (Internal Medicine)  Extended Emergency Contact Information Primary Emergency Contact: Mcbroom,[name] Address: [address]          [place], [place] [address] [name] Phone: [phone] Mobile Phone: [phone] Relation: Son Secondary Emergency Contact: [place] of [place] Phone: [phone] Relation: Daughter    Allergies: Ambien [zolpidem tartrate]; Codeine phosphate; Darvocet [propoxyphene n-acetaminophen]; Promethazine hcl; Sulfamethoxazole; Vicodin [hydrocodone-acetaminophen]; Amoxicillin; and Penicillins  Chief Complaint  Patient presents with  . Medical Management of Chronic Issues    HPI: Patient is 84 y.o. female who is being seen for routine issues of history of DVT, restless leg syndrome, and polymyalgia rheumatica.  Past Medical History:  Diagnosis Date  . Anemia 03/02/2012  . Angina pectoris, unstable ([place]) 03/02/2012  . Anxiety   . Axillary abscess   . Bradycardia 06/13/2012  . CAD (coronary artery disease)    S/p PTCA / stenting (last cath 2004, multiple LAD stents, 2 stents in the right coronary artery all patent)  . Cancer ([place])   . Complication of anesthesia   . Compression fracture of L2 ([facility]) 03/12/2017  . Dementia   . Dysphagia 10/06/2012  . Dyspnea 02/03/2012  . GERD (gastroesophageal reflux disease)   . History of pulmonary embolus (PE) 03/19/2017  . HTN (hypertension)   . PMR (polymyalgia rheumatica) (HCC)   . PONV (postoperative nausea and vomiting)   . Restless leg syndrome   . 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 Started coumadin  . Weakness of both legs 06/09/2012    Past Surgical History:  Procedure Laterality Date  . BREAST LUMPECTOMY    . COLECTOMY    . ESOPHAGOGASTRODUODENOSCOPY (EGD)  WITH ESOPHAGEAL DILATION  10/10/2012   Procedure: ESOPHAGOGASTRODUODENOSCOPY (EGD) WITH ESOPHAGEAL DILATION;  Surgeon: Inda Castle, MD;  Location: [facility];  Service: Endoscopy;  Laterality: N/A;  . heart stents  x 8  . INCISION AND DRAINAGE     bilateral axillary, non specific staff  . INCISION AND DRAINAGE ABSCESS  09/28/2012   Procedure: INCISION AND DRAINAGE ABSCESS;  Surgeon: Zenovia Jarred, MD;  Location: [facility];  Service: General;  Laterality: Bilateral;  . stent     cardiac x 8 stents.    Allergies as of 01/04/2018      Reactions   Ambien [zolpidem Tartrate] Nausea And Vomiting   Codeine Phosphate Nausea And Vomiting   Darvocet [propoxyphene N-acetaminophen] Nausea And Vomiting   Promethazine Hcl Nausea And Vomiting   Sulfamethoxazole Other (See Comments)   Pt doesn't remember reaction   Vicodin [hydrocodone-acetaminophen] Other (See Comments)   Unknown reaction   Amoxicillin Nausea And Vomiting, Rash   Penicillins Nausea And Vomiting, Rash      Medication List        Accurate as of 01/04/18 11:59 PM. Always use your most recent med list.          ALPRAZolam 0.5 MG tablet Commonly known as:  XANAX Take 0.5 mg by mouth daily.   amLODipine 10 MG tablet Commonly known as:  NORVASC Take 10 mg by mouth every morning.   aspirin EC 81 MG tablet Take 1 tablet (81 mg total) by mouth daily.   atorvastatin 10 MG tablet Commonly known as:  LIPITOR Take 10 mg by mouth  every evening.   cholecalciferol 1000 units tablet Commonly known as:  VITAMIN D Take 2,000 Units by mouth daily with lunch.   donepezil 10 MG tablet Commonly known as:  ARICEPT Take 10 mg by mouth every morning.   feeding supplement (PRO-STAT SUGAR FREE 64) Liqd Take 30 mLs by mouth 3 (three) times daily.   fluticasone 50 MCG/ACT nasal spray Commonly known as:  FLONASE Place 1 spray into both nostrils daily as needed for allergies or rhinitis.   hydrALAZINE 25 MG tablet Commonly known as:   APRESOLINE Take 1 tablet (25 mg total) by mouth every 8 (eight) hours.   losartan 50 MG tablet Commonly known as:  COZAAR Take 50 mg by mouth at bedtime.   mirtazapine 15 MG tablet Commonly known as:  REMERON Take 7.5 mg by mouth at bedtime.   nitroGLYCERIN 0.4 MG SL tablet Commonly known as:  NITROSTAT Place 0.4 mg under the tongue every 5 (five) minutes as needed for chest pain.   ondansetron 4 MG tablet Commonly known as:  ZOFRAN Take 4 mg by mouth every 4 (four) hours as needed for nausea.   oxybutynin 5 MG tablet Commonly known as:  DITROPAN Take 5 mg by mouth 2 (two) times daily.   pantoprazole 40 MG tablet Commonly known as:  PROTONIX Take 40 mg by mouth 2 (two) times daily as needed (for heartburn).   potassium chloride SA 20 MEQ tablet Commonly known as:  K-DUR,KLOR-CON Take 20 mEq by mouth daily with lunch.   predniSONE 1 MG tablet Commonly known as:  DELTASONE Take 1 mg by mouth. Take one tablet daily with breakfast   rOPINIRole 1 MG tablet Commonly known as:  REQUIP Take 1 mg by mouth. 1mg  at noon, 1mg  2 hours before bed, then 1mg  at bedtime   sertraline 25 MG tablet Commonly known as:  ZOLOFT Take 25 mg by mouth every morning.   SILENOR 6 MG Tabs Generic drug:  Doxepin HCl Take 6 mg by mouth at bedtime.   traMADol 50 MG tablet Commonly known as:  ULTRAM Take 50 mg by mouth at bedtime.   warfarin 2 MG tablet Commonly known as:  COUMADIN Take 2 mg by mouth daily.       No orders of the defined types were placed in this encounter.   Immunization History  Administered Date(s) Administered  . Influenza-Unspecified 08/01/2016, 08/11/2017  . PPD Test 03/18/2017  . Pneumococcal Conjugate-13 09/17/2017    Social History   Tobacco Use  . Smoking status: Never Smoker  . Smokeless tobacco: Never Used  Substance Use Topics  . Alcohol use: No    Review of Systems  DATA OBTAINED: from patient, nurse GENERAL:  no fevers, fatigue, appetite  changes SKIN: No itching, rash HEENT: No complaint RESPIRATORY: No cough, wheezing, SOB CARDIAC: No chest pain, palpitations, lower extremity edema  GI: No abdominal pain, No N/V/D or constipation, No heartburn or reflux  GU: No dysuria, frequency or urgency, or incontinence  MUSCULOSKELETAL: No unrelieved bone/joint pain NEUROLOGIC: No headache, dizziness  PSYCHIATRIC: No overt anxiety or sadness  Vitals:   01/04/18 1059  BP: 122/61  Pulse: 63  Resp: 18  Temp: 98.4 F (36.9 C)   Body mass index is 25.13 kg/m. Physical Exam  GENERAL APPEARANCE: Alert, conversant, No acute distress  SKIN: No diaphoresis rash HEENT: Unremarkable RESPIRATORY: Breathing is even, unlabored. Lung sounds are clear   CARDIOVASCULAR: Heart RRR no murmurs, rubs or gallops. No peripheral edema  GASTROINTESTINAL: Abdomen  is soft, non-tender, not distended w/ normal bowel sounds.  GENITOURINARY: Bladder non tender, not distended  MUSCULOSKELETAL: No abnormal joints or musculature NEUROLOGIC: Cranial nerves 2-12 grossly intact. Moves all extremities PSYCHIATRIC: Mood and affect with dementia, no behavioral issues  Patient Active Problem List   Diagnosis Date Noted  . Overactive bladder 09/24/2017  . Insomnia 07/31/2017  . Anxiety 06/16/2017  . Citrobacter infection 03/19/2017  . AKI (acute kidney injury) ([facility]) 03/19/2017  . History of pulmonary embolus (PE) 03/19/2017  . Elevated INR 03/19/2017  . Candidal intertrigo 03/19/2017  . Compression fracture of L2 ([place]) 03/12/2017  . Acute lower UTI 03/12/2017  . Accelerated hypertension 03/12/2017  . Leukocytosis 07/31/2015  . Acute bronchitis 07/28/2015  . Chronic anticoagulation 07/28/2015  . Intractable back pain 07/20/2015  . Back pain 07/20/2015  . Cellulitis and abscess of left buttock 04/12/2013  . Stricture and stenosis of esophagus 10/10/2012  . Dementia 10/06/2012  . Dysphagia 10/06/2012  . Axillary abscess - bilateral, multiple  09/27/2012  . Hypotension 08/24/2012  . Near syncope 08/24/2012  . Abscess of axilla, left 06/27/2012  . Bradycardia 06/13/2012  . Abscess 06/11/2012  . Chest pain 06/09/2012  . Cellulitis 06/09/2012  . Weakness of both legs 06/09/2012  . PMR (polymyalgia rheumatica) (HCC)   . Restless leg syndrome   . Angina pectoris, unstable ([place]) 03/02/2012  . Anemia 03/02/2012  . Dyspnea 02/03/2012  . UTI (urinary tract infection) 02/03/2012  . CAD (coronary artery disease)   . HTN (hypertension)   . VTE (venous thromboembolism) 10/01/2010  . HYPERCHOLESTEROLEMIA  IIA 08/27/2009  . Hyperlipidemia 12/11/2008  . HYPERTENSION, BENIGN 12/11/2008    CMP     Component Value Date/Time   NA 139 09/27/2017   K 4.5 09/27/2017   CL 111 03/16/2017 0400   CO2 21 (L) 03/16/2017 0400   GLUCOSE 119 (H) 03/16/2017 0400   BUN 24 (A) 09/27/2017   CREATININE 1.0 09/27/2017   CREATININE 1.03 (H) 03/16/2017 0400   CALCIUM 8.7 (L) 03/16/2017 0400   PROT 6.7 03/11/2017 2031   ALBUMIN 3.4 (L) 03/11/2017 2031   AST 13 09/27/2017   ALT 9 09/27/2017   ALKPHOS 59 09/27/2017   BILITOT 0.5 03/11/2017 2031   GFRNONAA 45 (L) 03/16/2017 0400   GFRAA 53 (L) 03/16/2017 0400   Recent Labs    03/14/17 0351  03/15/17 0402  03/16/17 0400 03/21/17 09/27/17  NA 137   < > 137   < > 139 137 139  K 4.1   < > 4.1  --  4.0 4.5 4.5  CL 107  --  110  --  111  --   --   CO2 20*  --  19*  --  21*  --   --   GLUCOSE 98  --  100*  --  119*  --   --   BUN 16   < > 18   < > 17 23* 24*  CREATININE 1.22*   < > 1.15*   < > 1.03* 1.0 1.0  CALCIUM 8.3*  --  8.4*  --  8.7*  --   --    < > = values in this interval not displayed.   Recent Labs    03/11/17 03/11/17 2031 09/27/17  AST 17 17 13   ALT 14 14 9   ALKPHOS 60 60 59  BILITOT  --  0.5  --   PROT  --  6.7  --   ALBUMIN  --  3.4*  --  Recent Labs    03/11/17 2031  03/12/17 0437  03/13/17 0255  03/15/17 0402 03/21/17 09/27/17  WBC 11.2*   < > 9.5   < > 10.1   <  > 7.7 9.0 6.7  NEUTROABS 8.2*  --   --   --  6.5  --  4.2  --   --   HGB 12.0   < > 10.8*   < > 12.7   < > 10.3* 10.2* 10.0*  HCT 36.6   < > 33.0*   < > 38.6   < > 32.1* 34* 32*  MCV 84.7  --  84.4  --  84.5  --  85.4  --   --   PLT 276   < > 264   < > 299   < > 290 309 250   < > = values in this interval not displayed.   Recent Labs    09/27/17  CHOL 129  LDLCALC 71  TRIG 111   No results found for: [facility] Lab Results  Component Value Date   TSH 1.35 09/27/2017   Lab Results  Component Value Date   HGBA1C 5.5 09/27/2017   Lab Results  Component Value Date   CHOL 129 09/27/2017   HDL 36 09/27/2017   LDLCALC 71 09/27/2017   TRIG 111 09/27/2017   CHOLHDL 2.4 03/03/2012    Significant Diagnostic Results in last 30 days:  No results found.  Assessment and Plan  History of pulmonary embolus (PE) No problems; continue Coumadin  PMR (polymyalgia rheumatica) No complaints of pain; continue prednisone 1 mg by mouth daily  Restless leg syndrome Controlled; continue Requip 1 mg 3 times a day    Ismael Karge D. [name], MD

## 2018-01-07 ENCOUNTER — Encounter: Payer: Self-pay | Admitting: Internal Medicine

## 2018-01-07 NOTE — Assessment & Plan Note (Signed)
No problems; continue Coumadin

## 2018-01-07 NOTE — Assessment & Plan Note (Signed)
Controlled; continue Requip 1 mg 3 times a day

## 2018-01-07 NOTE — Assessment & Plan Note (Signed)
No complaints of pain; continue prednisone 1 mg by mouth daily

## 2018-01-16 DIAGNOSIS — F0390 Unspecified dementia without behavioral disturbance: Secondary | ICD-10-CM | POA: Diagnosis not present

## 2018-01-16 DIAGNOSIS — F329 Major depressive disorder, single episode, unspecified: Secondary | ICD-10-CM | POA: Diagnosis not present

## 2018-01-16 DIAGNOSIS — F419 Anxiety disorder, unspecified: Secondary | ICD-10-CM | POA: Diagnosis not present

## 2018-02-01 ENCOUNTER — Encounter: Payer: Self-pay | Admitting: Adult Health

## 2018-02-01 ENCOUNTER — Non-Acute Institutional Stay (SKILLED_NURSING_FACILITY): Payer: Medicare Other | Admitting: Adult Health

## 2018-02-01 DIAGNOSIS — Z86711 Personal history of pulmonary embolism: Secondary | ICD-10-CM

## 2018-02-01 DIAGNOSIS — Z7901 Long term (current) use of anticoagulants: Secondary | ICD-10-CM

## 2018-02-01 DIAGNOSIS — I8290 Acute embolism and thrombosis of unspecified vein: Secondary | ICD-10-CM

## 2018-02-01 NOTE — Progress Notes (Signed)
Location:   [facility] Room Number: 7 D Place of Service:  SNF (31)   CODE STATUS: DNR  Allergies  Allergen Reactions  . Ambien [Zolpidem Tartrate] Nausea And Vomiting  . Codeine Phosphate Nausea And Vomiting  . Darvocet [Propoxyphene N-Acetaminophen] Nausea And Vomiting  . Promethazine Hcl Nausea And Vomiting  . Sulfamethoxazole Other (See Comments)    Pt doesn't remember reaction  . Vicodin [Hydrocodone-Acetaminophen] Other (See Comments)    Unknown reaction  . Amoxicillin Nausea And Vomiting and Rash  . Penicillins Nausea And Vomiting and Rash    Chief Complaint  Patient presents with  . Acute Visit    INR Results    HPI:  She is on chronic coumadin therapy or her VTE and history of PE. She is tolerating the coumadin without difficulty; there are no indications of abnormal bleeding. There are no reports of missed doses.  There are no reports of chest pain; shortness of breath; or leg pain. She is unable to fully participate in the hpi or ros. There are on nursing concerns at this time. Her INR today is 1.3 and she is taking coumadin 2 mg daily   Past Medical History:  Diagnosis Date  . Anemia 03/02/2012  . Angina pectoris, unstable ([facility]) 03/02/2012  . Anxiety   . Axillary abscess   . Bradycardia 06/13/2012  . CAD (coronary artery disease)    S/p PTCA / stenting (last cath 2004, multiple LAD stents, 2 stents in the right coronary artery all patent)  . Cancer ([facility])   . Complication of anesthesia   . Compression fracture of L2 ([facility]) 03/12/2017  . Dementia   . Dysphagia 10/06/2012  . Dyspnea 02/03/2012  . GERD (gastroesophageal reflux disease)   . History of pulmonary embolus (PE) 03/19/2017  . HTN (hypertension)   . PMR (polymyalgia rheumatica) (HCC)   . PONV (postoperative nausea and vomiting)   . Restless leg syndrome   . 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 Started coumadin  . Weakness of both legs 06/09/2012    Past  Surgical History:  Procedure Laterality Date  . BREAST LUMPECTOMY    . COLECTOMY    . ESOPHAGOGASTRODUODENOSCOPY (EGD) WITH ESOPHAGEAL DILATION  10/10/2012   Procedure: ESOPHAGOGASTRODUODENOSCOPY (EGD) WITH ESOPHAGEAL DILATION;  Surgeon: Inda Castle, MD;  Location: [facility];  Service: Endoscopy;  Laterality: N/A;  . heart stents  x 8  . INCISION AND DRAINAGE     bilateral axillary, non specific staff  . INCISION AND DRAINAGE ABSCESS  09/28/2012   Procedure: INCISION AND DRAINAGE ABSCESS;  Surgeon: Zenovia Jarred, MD;  Location: [facility];  Service: General;  Laterality: Bilateral;  . stent     cardiac x 8 stents.    Social History   Socioeconomic History  . Marital status: Widowed    Spouse name: Not on file  . Number of children: Not on file  . Years of education: Not on file  . Highest education level: Not on file  Occupational History  . Occupation: housewife  Social Needs  . Financial resource strain: Not on file  . Food insecurity:    Worry: Not on file    Inability: Not on file  . Transportation needs:    Medical: Not on file    Non-medical: Not on file  Tobacco Use  . Smoking status: Never Smoker  . Smokeless tobacco: Never Used  Substance and Sexual Activity  . Alcohol use: No  .  Drug use: No  . Sexual activity: Never  Lifestyle  . Physical activity:    Days per week: Not on file    Minutes per session: Not on file  . Stress: Not on file  Relationships  . Social connections:    Talks on phone: Not on file    Gets together: Not on file    Attends religious service: Not on file    Active member of club or organization: Not on file    Attends meetings of clubs or organizations: Not on file    Relationship status: Not on file  . Intimate partner violence:    Fear of current or ex partner: Not on file    Emotionally abused: Not on file    Physically abused: Not on file    Forced sexual activity: Not on file  Other Topics Concern  . Not on file    Social History Narrative   She lives at home with her son and is still ambulatory daily with a cane or walker. She denies any history of smoking. Marland Kitchen.Mother died at 61 from a myocardial infarction.  Father    died at 33 from a stroke, possible history of PE. She has nine brothers and    sisters, one sister with bypass and a brother with bypass, also has a    brother who died abruptly of a pulmonary embolism.      Admitted to [facility] 03/16/17   Alcohol none   DNR      Family History  Problem Relation Age of Onset  . Heart failure Mother   . Stroke Father   . Breast cancer Sister   . Heart disease Sister   . Heart disease Brother       VITAL SIGNS BP 128/60   Pulse 77   Temp 98 F (36.7 C)   Resp 18   Ht 5\' 1"  (1.549 m)   Wt 127 lb 6.4 oz (57.8 kg)   LMP  (LMP Unknown)   BMI 24.07 kg/m   Outpatient Encounter Medications as of 02/01/2018  Medication Sig  . ALPRAZolam (XANAX) 0.5 MG tablet Take 0.5 mg by mouth daily.   Marland Kitchen amLODipine (NORVASC) 10 MG tablet Take 10 mg by mouth every morning.   Marland Kitchen aspirin EC 81 MG tablet Take 1 tablet (81 mg total) by mouth daily.  Marland Kitchen atorvastatin (LIPITOR) 10 MG tablet Take 10 mg by mouth every evening.  . cholecalciferol (VITAMIN D) 1000 UNITS tablet Take 2,000 Units by mouth daily with lunch.   . donepezil (ARICEPT) 10 MG tablet Take 10 mg by mouth every morning.  . fluticasone (FLONASE) 50 MCG/ACT nasal spray Place 1 spray into both nostrils daily as needed for allergies or rhinitis.  . hydrALAZINE (APRESOLINE) 25 MG tablet Take 1 tablet (25 mg total) by mouth every 8 (eight) hours.  Marland Kitchen losartan (COZAAR) 50 MG tablet Take 50 mg by mouth at bedtime.   . mirtazapine (REMERON) 15 MG tablet Take 7.5 mg by mouth at bedtime.   . nitroGLYCERIN (NITROSTAT) 0.4 MG SL tablet Place 0.4 mg under the tongue every 5 (five) minutes as needed for chest pain.  Marland Kitchen ondansetron (ZOFRAN) 4 MG tablet Take 4 mg by mouth every 4 (four) hours as needed  for nausea.   Marland Kitchen oxybutynin (DITROPAN) 5 MG tablet Take 5 mg by mouth 2 (two) times daily.   . pantoprazole (PROTONIX) 40 MG tablet Take 40 mg by mouth 2 (two) times  daily as needed (for heartburn).  . potassium chloride SA (K-DUR,KLOR-CON) 20 MEQ tablet Take 20 mEq by mouth daily with lunch.   . predniSONE (DELTASONE) 1 MG tablet Take 1 mg by mouth. Take one tablet daily with breakfast  . rOPINIRole (REQUIP) 1 MG tablet Take 1 mg by mouth. 1mg  at noon, 1mg  2 hours before bed, then 1mg  at bedtime  . sertraline (ZOLOFT) 25 MG tablet Take 25 mg by mouth every morning.   Marland Kitchen SILENOR 6 MG TABS Take 6 mg by mouth at bedtime.   . traMADol (ULTRAM) 50 MG tablet Take 50 mg by mouth at bedtime.  Marland Kitchen warfarin (COUMADIN) 2 MG tablet Take 2 mg by mouth daily.  . [DISCONTINUED] Amino Acids-Protein Hydrolys (FEEDING SUPPLEMENT, PRO-STAT SUGAR FREE 64,) LIQD Take 30 mLs by mouth 3 (three) times daily.   No facility-administered encounter medications on file as of 02/01/2018.      SIGNIFICANT DIAGNOSTIC EXAMS  LABS REVIEWED TODAY:   02-01-18: INR 1.3 on 2 mg coumadin.    Review of Systems  Unable to perform ROS: Dementia (confusion )    Physical Exam  Constitutional: No distress.  Neck: No thyromegaly present.  Cardiovascular: Normal rate, regular rhythm, normal heart sounds and intact distal pulses.  Pulmonary/Chest: Effort normal and breath sounds normal. No respiratory distress.  Abdominal: Soft. Bowel sounds are normal. She exhibits no distension. There is no tenderness.  Musculoskeletal: She exhibits no edema.  Lymphadenopathy:    She has no cervical adenopathy.  Neurological: She is alert.  Skin: Skin is dry. She is not diaphoretic.  Psychiatric: She has a normal mood and affect.      ASSESSMENT/ PLAN:  TODAY:   1. VTE:  2. History of pulmonary embolus 3. Chronic anticoagulation   For INR 1.3 will begin coumadin 3 mg nightly and will check INR on 02-06-18.    MD is aware of  resident's narcotic use and is in agreement with current plan of care. We will attempt to wean resident as apropriate   Ok Edwards NP [facility]  Contact [phone] Monday through Friday 8am- 5pm  After hours call [phone]

## 2018-02-06 ENCOUNTER — Non-Acute Institutional Stay (SKILLED_NURSING_FACILITY): Payer: Medicare Other | Admitting: Adult Health

## 2018-02-06 ENCOUNTER — Encounter: Payer: Self-pay | Admitting: Adult Health

## 2018-02-06 DIAGNOSIS — Z7901 Long term (current) use of anticoagulants: Secondary | ICD-10-CM

## 2018-02-06 DIAGNOSIS — I8290 Acute embolism and thrombosis of unspecified vein: Secondary | ICD-10-CM

## 2018-02-06 DIAGNOSIS — Z86711 Personal history of pulmonary embolism: Secondary | ICD-10-CM

## 2018-02-06 NOTE — Progress Notes (Signed)
Location:   [facility] Room Number: 10 D Place of Service:  SNF (31)   CODE STATUS: DNR  Allergies  Allergen Reactions  . Ambien [Zolpidem Tartrate] Nausea And Vomiting  . Codeine Phosphate Nausea And Vomiting  . Darvocet [Propoxyphene N-Acetaminophen] Nausea And Vomiting  . Fish-Derived Products   . Promethazine Hcl Nausea And Vomiting  . Sulfamethoxazole Other (See Comments)    Pt doesn't remember reaction  . Vicodin [Hydrocodone-Acetaminophen] Other (See Comments)    Unknown reaction  . Amoxicillin Nausea And Vomiting and Rash  . Penicillins Nausea And Vomiting and Rash    Chief Complaint  Patient presents with  . 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 There are no reports of missed doses; no reports of abnormal bleeding; no reports of lower extremity edema. She is unable to fully participate in the hpi or ros. There are no nursing concerns at this time.    Past Medical History:  Diagnosis Date  . Anemia 03/02/2012  . Angina pectoris, unstable ([place]) 03/02/2012  . Anxiety   . Axillary abscess   . Bradycardia 06/13/2012  . CAD (coronary artery disease)    S/p PTCA / stenting (last cath 2004, multiple LAD stents, 2 stents in the right coronary artery all patent)  . Cancer ([place])   . Complication of anesthesia   . Compression fracture of L2 ([place]) 03/12/2017  . Dementia   . Dysphagia 10/06/2012  . Dyspnea 02/03/2012  . GERD (gastroesophageal reflux disease)   . History of pulmonary embolus (PE) 03/19/2017  . HTN (hypertension)   . PMR (polymyalgia rheumatica) (HCC)   . PONV (postoperative nausea and vomiting)   . Restless leg syndrome   . 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 Started coumadin  . Weakness of both legs 06/09/2012    Past Surgical History:  Procedure Laterality Date  . BREAST LUMPECTOMY    . COLECTOMY    . ESOPHAGOGASTRODUODENOSCOPY (EGD) WITH  ESOPHAGEAL DILATION  10/10/2012   Procedure: ESOPHAGOGASTRODUODENOSCOPY (EGD) WITH ESOPHAGEAL DILATION;  Surgeon: Inda Castle, MD;  Location: [facility];  Service: Endoscopy;  Laterality: N/A;  . heart stents  x 8  . INCISION AND DRAINAGE     bilateral axillary, non specific staff  . INCISION AND DRAINAGE ABSCESS  09/28/2012   Procedure: INCISION AND DRAINAGE ABSCESS;  Surgeon: Zenovia Jarred, MD;  Location: [facility];  Service: General;  Laterality: Bilateral;  . stent     cardiac x 8 stents.    Social History   Socioeconomic History  . Marital status: Widowed    Spouse name: Not on file  . Number of children: Not on file  . Years of education: Not on file  . Highest education level: Not on file  Occupational History  . Occupation: housewife  Social Needs  . Financial resource strain: Not on file  . Food insecurity:    Worry: Not on file    Inability: Not on file  . Transportation needs:    Medical: Not on file    Non-medical: Not on file  Tobacco Use  . Smoking status: Never Smoker  . Smokeless tobacco: Never Used  Substance and Sexual Activity  . Alcohol use: No  . Drug use: No  . Sexual activity: Never  Lifestyle  . Physical activity:    Days per week: Not on file  Minutes per session: Not on file  . Stress: Not on file  Relationships  . Social connections:    Talks on phone: Not on file    Gets together: Not on file    Attends religious service: Not on file    Active member of club or organization: Not on file    Attends meetings of clubs or organizations: Not on file    Relationship status: Not on file  . Intimate partner violence:    Fear of current or ex partner: Not on file    Emotionally abused: Not on file    Physically abused: Not on file    Forced sexual activity: Not on file  Other Topics Concern  . Not on file  Social History Narrative   She lives at home with her son and is still ambulatory daily with a cane or walker. She denies any  history of smoking. Marland Kitchen.Mother died at 65 from a myocardial infarction.  Father    died at 29 from a stroke, possible history of PE. She has nine brothers and    sisters, one sister with bypass and a brother with bypass, also has a    brother who died abruptly of a pulmonary embolism.      Admitted to [facility] 03/16/17   Alcohol none   DNR      Family History  Problem Relation Age of Onset  . Heart failure Mother   . Stroke Father   . Breast cancer Sister   . Heart disease Sister   . Heart disease Brother       VITAL SIGNS BP (!) 114/57   Pulse 60   Temp (!) 96.7 F (35.9 C)   Resp 16   Ht 5\' 1"  (1.549 m)   Wt 129 lb (58.5 kg)   LMP  (LMP Unknown)   BMI 24.37 kg/m   Outpatient Encounter Medications as of 02/06/2018  Medication Sig  . ALPRAZolam (XANAX) 0.5 MG tablet Take 0.5 mg by mouth daily.   Marland Kitchen amLODipine (NORVASC) 10 MG tablet Take 10 mg by mouth every morning.   Marland Kitchen aspirin EC 81 MG tablet Take 1 tablet (81 mg total) by mouth daily.  Marland Kitchen atorvastatin (LIPITOR) 10 MG tablet Take 10 mg by mouth every evening.  . cholecalciferol (VITAMIN D) 1000 UNITS tablet Take 2,000 Units by mouth daily with lunch.   . donepezil (ARICEPT) 10 MG tablet Take 10 mg by mouth every morning.  . fluticasone (FLONASE) 50 MCG/ACT nasal spray Place 1 spray into both nostrils daily as needed for allergies or rhinitis.  . hydrALAZINE (APRESOLINE) 25 MG tablet Take 1 tablet (25 mg total) by mouth every 8 (eight) hours.  Marland Kitchen losartan (COZAAR) 50 MG tablet Take 50 mg by mouth at bedtime.   . mirtazapine (REMERON) 15 MG tablet Take 7.5 mg by mouth at bedtime.   . nitroGLYCERIN (NITROSTAT) 0.4 MG SL tablet Place 0.4 mg under the tongue every 5 (five) minutes as needed for chest pain.  Marland Kitchen ondansetron (ZOFRAN) 4 MG tablet Take 4 mg by mouth every 4 (four) hours as needed for nausea.   Marland Kitchen oxybutynin (DITROPAN) 5 MG tablet Take 5 mg by mouth 2 (two) times daily.   . pantoprazole (PROTONIX) 40 MG  tablet Take 40 mg by mouth 2 (two) times daily as needed (for heartburn).  . potassium chloride SA (K-DUR,KLOR-CON) 20 MEQ tablet Take 20 mEq by mouth daily with lunch.   . predniSONE (DELTASONE)  1 MG tablet Take 1 mg by mouth. Take one tablet daily with breakfast  . rOPINIRole (REQUIP) 1 MG tablet Take 1 mg by mouth. 1mg  at noon, 1mg  2 hours before bed, then 1mg  at bedtime  . sertraline (ZOLOFT) 25 MG tablet Take 25 mg by mouth every morning.   Marland Kitchen SILENOR 6 MG TABS Take 6 mg by mouth at bedtime.   . traMADol (ULTRAM) 50 MG tablet Take 50 mg by mouth at bedtime.  Marland Kitchen warfarin (COUMADIN) 3 MG tablet Take 3 mg by mouth daily.    No facility-administered encounter medications on file as of 02/06/2018.      SIGNIFICANT DIAGNOSTIC EXAMS   LABS REVIEWED PREVIOUS:   02-01-18: INR 1.3 on 2 mg coumadin.  TODAY:   02-06-18: INR 1.6 on 3 mg coumadin     Review of Systems  Unable to perform ROS: Dementia (confusion )   Physical Exam  Constitutional: No distress.  Neck: No thyromegaly present.  Cardiovascular: Normal rate, regular rhythm, normal heart sounds and intact distal pulses.  No murmur heard. Pulmonary/Chest: Effort normal and breath sounds normal. No respiratory distress.  Abdominal: Soft. Bowel sounds are normal. She exhibits no distension. There is no tenderness.  Musculoskeletal: Normal range of motion. She exhibits no edema.  Lymphadenopathy:    She has no cervical adenopathy.  Neurological: She is alert.  Skin: Skin is warm and dry. She is not diaphoretic.    ASSESSMENT/ PLAN:  TODAY:   1. VTE 2. History of PE 3. Chronic anticoagulation  For INR of 1.6 will begin coumadin 4 mg nightly and will check INR on 02-13-18.    MD is aware of resident's narcotic use and is in agreement with current plan of care. We will attempt to wean resident as apropriate   Ok Edwards NP [facility]  Contact [phone] Monday through Friday 8am- 5pm  After hours call  [phone]

## 2018-02-16 DIAGNOSIS — F329 Major depressive disorder, single episode, unspecified: Secondary | ICD-10-CM | POA: Diagnosis not present

## 2018-02-16 DIAGNOSIS — F0390 Unspecified dementia without behavioral disturbance: Secondary | ICD-10-CM | POA: Diagnosis not present

## 2018-02-16 DIAGNOSIS — F419 Anxiety disorder, unspecified: Secondary | ICD-10-CM | POA: Diagnosis not present

## 2018-02-21 DIAGNOSIS — R262 Difficulty in walking, not elsewhere classified: Secondary | ICD-10-CM | POA: Diagnosis not present

## 2018-02-21 DIAGNOSIS — I739 Peripheral vascular disease, unspecified: Secondary | ICD-10-CM | POA: Diagnosis not present

## 2018-02-21 DIAGNOSIS — B351 Tinea unguium: Secondary | ICD-10-CM | POA: Diagnosis not present

## 2018-02-27 ENCOUNTER — Encounter: Payer: Self-pay | Admitting: Internal Medicine

## 2018-02-27 ENCOUNTER — Non-Acute Institutional Stay (SKILLED_NURSING_FACILITY): Payer: Medicare Other | Admitting: Internal Medicine

## 2018-02-27 DIAGNOSIS — E785 Hyperlipidemia, unspecified: Secondary | ICD-10-CM

## 2018-02-27 DIAGNOSIS — I2510 Atherosclerotic heart disease of native coronary artery without angina pectoris: Secondary | ICD-10-CM

## 2018-02-27 DIAGNOSIS — F419 Anxiety disorder, unspecified: Secondary | ICD-10-CM

## 2018-02-27 NOTE — Progress Notes (Signed)
Location:  [facility] Room Number: [phone] Place of Service:  SNF (31)  Hennie Duos, MD  Patient Care Team: Hennie Duos, MD as PCP - General (Internal Medicine)  Extended Emergency Contact Information Primary Emergency Contact: Luhman,[name] Address: [address]          [place], [place] [address] [name] of [place] Phone: [phone] Mobile Phone: [phone] Relation: Son Secondary Emergency Contact: [name] of [place] Phone: [phone] Relation: Daughter    Allergies: Ambien [zolpidem tartrate]; Codeine phosphate; Darvocet [propoxyphene n-acetaminophen]; Fish-derived products; Promethazine hcl; Sulfamethoxazole; Vicodin [hydrocodone-acetaminophen]; Amoxicillin; and Penicillins  Chief Complaint  Patient presents with  . Medical Management of Chronic Issues    Routine Visit    HPI: Patient is 84 y.o. female who is being seen for routine issues of coronary artery disease, anxiety, and hyperlipidemia.  Past Medical History:  Diagnosis Date  . Anemia 03/02/2012  . Angina pectoris, unstable ([place]) 03/02/2012  . Anxiety   . Axillary abscess   . Bradycardia 06/13/2012  . CAD (coronary artery disease)    S/p PTCA / stenting (last cath 2004, multiple LAD stents, 2 stents in the right coronary artery all patent)  . Cancer ([facility])   . Complication of anesthesia   . Compression fracture of L2 ([facility]) 03/12/2017  . Dementia   . Dysphagia 10/06/2012  . Dyspnea 02/03/2012  . GERD (gastroesophageal reflux disease)   . History of pulmonary embolus (PE) 03/19/2017  . HTN (hypertension)   . PMR (polymyalgia rheumatica) (HCC)   . PONV (postoperative nausea and vomiting)   . Restless leg syndrome   . 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 Started coumadin  . Weakness of both legs 06/09/2012    Past Surgical History:  Procedure Laterality Date  . BREAST LUMPECTOMY    . COLECTOMY    .  ESOPHAGOGASTRODUODENOSCOPY (EGD) WITH ESOPHAGEAL DILATION  10/10/2012   Procedure: ESOPHAGOGASTRODUODENOSCOPY (EGD) WITH ESOPHAGEAL DILATION;  Surgeon: Inda Castle, MD;  Location: [facility];  Service: Endoscopy;  Laterality: N/A;  . heart stents  x 8  . INCISION AND DRAINAGE     bilateral axillary, non specific staff  . INCISION AND DRAINAGE ABSCESS  09/28/2012   Procedure: INCISION AND DRAINAGE ABSCESS;  Surgeon: Zenovia Jarred, MD;  Location: [facility];  Service: General;  Laterality: Bilateral;  . stent     cardiac x 8 stents.    Allergies as of 02/27/2018      Reactions   Ambien [zolpidem Tartrate] Nausea And Vomiting   Codeine Phosphate Nausea And Vomiting   Darvocet [propoxyphene N-acetaminophen] Nausea And Vomiting   Fish-derived Products    Promethazine Hcl Nausea And Vomiting   Sulfamethoxazole Other (See Comments)   Pt doesn't remember reaction   Vicodin [hydrocodone-acetaminophen] Other (See Comments)   Unknown reaction   Amoxicillin Nausea And Vomiting, Rash   Penicillins Nausea And Vomiting, Rash      Medication List        Accurate as of 02/27/18 11:59 PM. Always use your most recent med list.          ALPRAZolam 0.5 MG tablet Commonly known as:  XANAX Take 0.5 mg by mouth daily.   amLODipine 10 MG tablet Commonly known as:  NORVASC Take 10 mg by mouth every morning.   aspirin EC 81 MG tablet Take 1 tablet (81 mg total) by mouth daily.   atorvastatin 10 MG tablet Commonly  known as:  LIPITOR Take 10 mg by mouth every evening.   cholecalciferol 1000 units tablet Commonly known as:  VITAMIN D Take 2,000 Units by mouth daily with lunch. 2 tabs   donepezil 10 MG tablet Commonly known as:  ARICEPT Take 10 mg by mouth every morning.   fluticasone 50 MCG/ACT nasal spray Commonly known as:  FLONASE Place 1 spray into both nostrils daily as needed for allergies or rhinitis.   hydrALAZINE 25 MG tablet Commonly known as:  APRESOLINE Take 1 tablet  (25 mg total) by mouth every 8 (eight) hours.   losartan 50 MG tablet Commonly known as:  COZAAR Take 50 mg by mouth at bedtime.   mirtazapine 15 MG tablet Commonly known as:  REMERON Take 7.5 mg by mouth at bedtime.   nitroGLYCERIN 0.4 MG SL tablet Commonly known as:  NITROSTAT Place 0.4 mg under the tongue every 5 (five) minutes as needed for chest pain.   ondansetron 4 MG tablet Commonly known as:  ZOFRAN Take 4 mg by mouth every 4 (four) hours as needed for nausea.   oxybutynin 5 MG tablet Commonly known as:  DITROPAN Take 5 mg by mouth 2 (two) times daily.   pantoprazole 40 MG tablet Commonly known as:  PROTONIX Take 40 mg by mouth 2 (two) times daily as needed (for heartburn).   potassium chloride SA 20 MEQ tablet Commonly known as:  K-DUR,KLOR-CON Take 20 mEq by mouth daily with lunch.   predniSONE 1 MG tablet Commonly known as:  DELTASONE Take 1 mg by mouth daily with breakfast.   rOPINIRole 1 MG tablet Commonly known as:  REQUIP Take 1 mg by mouth. 1mg  at noon, 1mg  2 hours before bed, then 1mg  at bedtime   sertraline 25 MG tablet Commonly known as:  ZOLOFT Take 25 mg by mouth every morning.   SILENOR 6 MG Tabs Generic drug:  Doxepin HCl Take 6 mg by mouth at bedtime.   traMADol 50 MG tablet Commonly known as:  ULTRAM Take 50 mg by mouth at bedtime.   warfarin 3 MG tablet Commonly known as:  COUMADIN Take 3 mg by mouth daily.       No orders of the defined types were placed in this encounter.   Immunization History  Administered Date(s) Administered  . Influenza-Unspecified 08/01/2016, 08/11/2017  . PPD Test 03/18/2017  . Pneumococcal Conjugate-13 09/17/2017    Social History   Tobacco Use  . Smoking status: Never Smoker  . Smokeless tobacco: Never Used  Substance Use Topics  . Alcohol use: No    Review of Systems  DATA OBTAINED: from patient, nurse GENERAL:  no fevers, fatigue, appetite changes SKIN: No itching, rash HEENT: No  complaint RESPIRATORY: No cough, wheezing, SOB CARDIAC: No chest pain, palpitations, lower extremity edema  GI: No abdominal pain, No N/V/D or constipation, No heartburn or reflux  GU: No dysuria, frequency or urgency, or incontinence  MUSCULOSKELETAL: No unrelieved bone/joint pain NEUROLOGIC: No headache, dizziness  PSYCHIATRIC: No overt anxiety or sadness  Vitals:   02/27/18 1134  BP: 120/67  Pulse: 78  Resp: 18  Temp: 98 F (36.7 C)  SpO2: 99%   Body mass index is 24.37 kg/m. Physical Exam  GENERAL APPEARANCE: Alert, conversant, No acute distress  SKIN: No diaphoresis rash HEENT: Unremarkable RESPIRATORY: Breathing is even, unlabored. Lung sounds are clear   CARDIOVASCULAR: Heart RRR no murmurs, rubs or gallops. No peripheral edema  GASTROINTESTINAL: Abdomen is soft, non-tender, not distended w/ normal  bowel sounds.  GENITOURINARY: Bladder non tender, not distended  MUSCULOSKELETAL: No abnormal joints or musculature NEUROLOGIC: Cranial nerves 2-12 grossly intact. Moves all extremities PSYCHIATRIC: Mood and affect with dementia, no behavioral issues  Patient Active Problem List   Diagnosis Date Noted  . Overactive bladder 09/24/2017  . Insomnia 07/31/2017  . Anxiety 06/16/2017  . Citrobacter infection 03/19/2017  . AKI (acute kidney injury) ([place]) 03/19/2017  . History of pulmonary embolus (PE) 03/19/2017  . Elevated INR 03/19/2017  . Candidal intertrigo 03/19/2017  . Compression fracture of L2 ([place]) 03/12/2017  . Acute lower UTI 03/12/2017  . Accelerated hypertension 03/12/2017  . Leukocytosis 07/31/2015  . Acute bronchitis 07/28/2015  . Chronic anticoagulation 07/28/2015  . Intractable back pain 07/20/2015  . Back pain 07/20/2015  . Cellulitis and abscess of left buttock 04/12/2013  . Stricture and stenosis of esophagus 10/10/2012  . Dementia 10/06/2012  . Dysphagia 10/06/2012  . Axillary abscess - bilateral, multiple 09/27/2012  . Hypotension 08/24/2012  .  Near syncope 08/24/2012  . Abscess of axilla, left 06/27/2012  . Bradycardia 06/13/2012  . Abscess 06/11/2012  . Chest pain 06/09/2012  . Cellulitis 06/09/2012  . Weakness of both legs 06/09/2012  . PMR (polymyalgia rheumatica) (HCC)   . Restless leg syndrome   . Angina pectoris, unstable ([place]) 03/02/2012  . Anemia 03/02/2012  . Dyspnea 02/03/2012  . UTI (urinary tract infection) 02/03/2012  . CAD (coronary artery disease)   . HTN (hypertension)   . VTE (venous thromboembolism) 10/01/2010  . HYPERCHOLESTEROLEMIA  IIA 08/27/2009  . Hyperlipidemia 12/11/2008  . HYPERTENSION, BENIGN 12/11/2008    CMP     Component Value Date/Time   NA 139 09/27/2017   K 4.5 09/27/2017   CL 111 03/16/2017 0400   CO2 21 (L) 03/16/2017 0400   GLUCOSE 119 (H) 03/16/2017 0400   BUN 24 (A) 09/27/2017   CREATININE 1.0 09/27/2017   CREATININE 1.03 (H) 03/16/2017 0400   CALCIUM 8.7 (L) 03/16/2017 0400   PROT 6.7 03/11/2017 2031   ALBUMIN 3.4 (L) 03/11/2017 2031   AST 13 09/27/2017   ALT 9 09/27/2017   ALKPHOS 59 09/27/2017   BILITOT 0.5 03/11/2017 2031   GFRNONAA 45 (L) 03/16/2017 0400   GFRAA 53 (L) 03/16/2017 0400   Recent Labs    09/27/17  NA 139  K 4.5  BUN 24*  CREATININE 1.0   Recent Labs    09/27/17  AST 13  ALT 9  ALKPHOS 59   Recent Labs    09/27/17  WBC 6.7  HGB 10.0*  HCT 32*  PLT 250   Recent Labs    09/27/17  CHOL 129  LDLCALC 71  TRIG 111   No results found for: [facility] Lab Results  Component Value Date   TSH 1.35 09/27/2017   Lab Results  Component Value Date   HGBA1C 5.5 09/27/2017   Lab Results  Component Value Date   CHOL 129 09/27/2017   HDL 36 09/27/2017   LDLCALC 71 09/27/2017   TRIG 111 09/27/2017   CHOLHDL 2.4 03/03/2012    Significant Diagnostic Results in last 30 days:  No results found.  Assessment and Plan  CAD (coronary artery disease) Continues with no reports of problems or chest pain; continue Coumadin for stent status,  ASA 81 mg daily, sublingual nitroglycerin when necessary; patient is on statin  Anxiety Control; continue Xanax 0.25 mg p.o. daily  Hyperlipidemia LDL 71 HDL 36; good control; continue Lipitor 10 mg daily  [name], MD

## 2018-03-14 ENCOUNTER — Non-Acute Institutional Stay (SKILLED_NURSING_FACILITY): Payer: Medicare Other

## 2018-03-14 DIAGNOSIS — F0390 Unspecified dementia without behavioral disturbance: Secondary | ICD-10-CM | POA: Diagnosis not present

## 2018-03-14 DIAGNOSIS — F419 Anxiety disorder, unspecified: Secondary | ICD-10-CM | POA: Diagnosis not present

## 2018-03-14 DIAGNOSIS — F329 Major depressive disorder, single episode, unspecified: Secondary | ICD-10-CM | POA: Diagnosis not present

## 2018-03-14 DIAGNOSIS — Z0000 Encounter for general adult medical examination without abnormal findings: Secondary | ICD-10-CM | POA: Diagnosis not present

## 2018-03-14 NOTE — Patient Instructions (Signed)
[name] , Thank you for taking time to come for your Medicare Wellness Visit. I appreciate your ongoing commitment to your health goals. Please review the following plan we discussed and let me know if I can assist you in the future.   Screening recommendations/referrals: Colonoscopy excluded, over age 82 Mammogram excluded, over age 74 Bone Density due, ordered Recommended yearly ophthalmology/optometry visit for glaucoma screening and checkup Recommended yearly dental visit for hygiene and checkup  Vaccinations: Influenza vaccine up to date, due 2019 fall season Pneumococcal vaccine up to date, 23 due on 09/17/2018 Tdap vaccine due, ordered Shingles vaccine not in past records    Advanced directives: in chart  Conditions/risks identified: none  Next appointment: Dr. [name] makes rounds   Preventive Care 65 Years and Older, Female Preventive care refers to lifestyle choices and visits with your health care provider that can promote health and wellness. What does preventive care include?  A yearly physical exam. This is also called an annual well check.  Dental exams once or twice a year.  Routine eye exams. Ask your health care provider how often you should have your eyes checked.  Personal lifestyle choices, including:  Daily care of your teeth and gums.  Regular physical activity.  Eating a healthy diet.  Avoiding tobacco and drug use.  Limiting alcohol use.  Practicing safe sex.  Taking low-dose aspirin every day.  Taking vitamin and mineral supplements as recommended by your health care provider. What happens during an annual well check? The services and screenings done by your health care provider during your annual well check will depend on your age, overall health, lifestyle risk factors, and family history of disease. Counseling  Your health care provider may ask you questions about your:  Alcohol use.  Tobacco use.  Drug use.  Emotional  well-being.  Home and relationship well-being.  Sexual activity.  Eating habits.  History of falls.  Memory and ability to understand (cognition).  Work and work Statistician.  Reproductive health. Screening  You may have the following tests or measurements:  Height, weight, and BMI.  Blood pressure.  Lipid and cholesterol levels. These may be checked every 5 years, or more frequently if you are over 51 years old.  Skin check.  Lung cancer screening. You may have this screening every year starting at age 34 if you have a 30-pack-year history of smoking and currently smoke or have quit within the past 15 years.  Fecal occult blood test (FOBT) of the stool. You may have this test every year starting at age 10.  Flexible sigmoidoscopy or colonoscopy. You may have a sigmoidoscopy every 5 years or a colonoscopy every 10 years starting at age 3.  Hepatitis C blood test.  Hepatitis B blood test.  Sexually transmitted disease (STD) testing.  Diabetes screening. This is done by checking your blood sugar (glucose) after you have not eaten for a while (fasting). You may have this done every 1-3 years.  Bone density scan. This is done to screen for osteoporosis. You may have this done starting at age 107.  Mammogram. This may be done every 1-2 years. Talk to your health care provider about how often you should have regular mammograms. Talk with your health care provider about your test results, treatment options, and if necessary, the need for more tests. Vaccines  Your health care provider may recommend certain vaccines, such as:  Influenza vaccine. This is recommended every year.  Tetanus, diphtheria, and acellular pertussis (Tdap, Td)  vaccine. You may need a Td booster every 10 years.  Zoster vaccine. You may need this after age 42.  Pneumococcal 13-valent conjugate (PCV13) vaccine. One dose is recommended after age 4.  Pneumococcal polysaccharide (PPSV23) vaccine. One  dose is recommended after age 2. Talk to your health care provider about which screenings and vaccines you need and how often you need them. This information is not intended to replace advice given to you by your health care provider. Make sure you discuss any questions you have with your health care provider. Document Released: 11/14/2015 Document Revised: 07/07/2016 Document Reviewed: 08/19/2015 Elsevier Interactive Patient Education  2017 [employer] Prevention in the Home Falls can cause injuries. They can happen to people of all ages. There are many things you can do to make your home safe and to help prevent falls. What can I do on the outside of my home?  Regularly fix the edges of walkways and driveways and fix any cracks.  Remove anything that might make you trip as you walk through a door, such as a raised step or threshold.  Trim any bushes or trees on the path to your home.  Use bright outdoor lighting.  Clear any walking paths of anything that might make someone trip, such as rocks or tools.  Regularly check to see if handrails are loose or broken. Make sure that both sides of any steps have handrails.  Any raised decks and porches should have guardrails on the edges.  Have any leaves, snow, or ice cleared regularly.  Use sand or salt on walking paths during winter.  Clean up any spills in your garage right away. This includes oil or grease spills. What can I do in the bathroom?  Use night lights.  Install grab bars by the toilet and in the tub and shower. Do not use towel bars as grab bars.  Use non-skid mats or decals in the tub or shower.  If you need to sit down in the shower, use a plastic, non-slip stool.  Keep the floor dry. Clean up any water that spills on the floor as soon as it happens.  Remove soap buildup in the tub or shower regularly.  Attach bath mats securely with double-sided non-slip rug tape.  Do not have throw rugs and other  things on the floor that can make you trip. What can I do in the bedroom?  Use night lights.  Make sure that you have a light by your bed that is easy to reach.  Do not use any sheets or blankets that are too big for your bed. They should not hang down onto the floor.  Have a firm chair that has side arms. You can use this for support while you get dressed.  Do not have throw rugs and other things on the floor that can make you trip. What can I do in the kitchen?  Clean up any spills right away.  Avoid walking on wet floors.  Keep items that you use a lot in easy-to-reach places.  If you need to reach something above you, use a strong step stool that has a grab bar.  Keep electrical cords out of the way.  Do not use floor polish or wax that makes floors slippery. If you must use wax, use non-skid floor wax.  Do not have throw rugs and other things on the floor that can make you trip. What can I do with my stairs?  Do not leave  any items on the stairs.  Make sure that there are handrails on both sides of the stairs and use them. Fix handrails that are broken or loose. Make sure that handrails are as long as the stairways.  Check any carpeting to make sure that it is firmly attached to the stairs. Fix any carpet that is loose or worn.  Avoid having throw rugs at the top or bottom of the stairs. If you do have throw rugs, attach them to the floor with carpet tape.  Make sure that you have a light switch at the top of the stairs and the bottom of the stairs. If you do not have them, ask someone to add them for you. What else can I do to help prevent falls?  Wear shoes that:  Do not have high heels.  Have rubber bottoms.  Are comfortable and fit you well.  Are closed at the toe. Do not wear sandals.  If you use a stepladder:  Make sure that it is fully opened. Do not climb a closed stepladder.  Make sure that both sides of the stepladder are locked into place.  Ask  someone to hold it for you, if possible.  Clearly mark and make sure that you can see:  Any grab bars or handrails.  First and last steps.  Where the edge of each step is.  Use tools that help you move around (mobility aids) if they are needed. These include:  Canes.  Walkers.  Scooters.  Crutches.  Turn on the lights when you go into a dark area. Replace any light bulbs as soon as they burn out.  Set up your furniture so you have a clear path. Avoid moving your furniture around.  If any of your floors are uneven, fix them.  If there are any pets around you, be aware of where they are.  Review your medicines with your doctor. Some medicines can make you feel dizzy. This can increase your chance of falling. Ask your doctor what other things that you can do to help prevent falls. This information is not intended to replace advice given to you by your health care provider. Make sure you discuss any questions you have with your health care provider. Document Released: 08/14/2009 Document Revised: 03/25/2016 Document Reviewed: 11/22/2014 Elsevier Interactive Patient Education  2017 [employer].

## 2018-03-14 NOTE — Progress Notes (Signed)
Subjective:   [name] [name] is a 84 y.o. female who presents for Medicare Annual (Subsequent) preventive examination at [facility]  Last AWV-05/24/2016    Objective:     Vitals: BP 122/68 (BP Location: Right Arm, Patient Position: Sitting)   Pulse 78   Temp 98 F (36.7 C) (Oral)   Ht 5\' 1"  (1.549 m)   Wt 129 lb (58.5 kg)   LMP  (LMP Unknown)   BMI 24.37 kg/m   Body mass index is 24.37 kg/m.  Advanced Directives 03/14/2018 02/27/2018 02/06/2018 02/01/2018 01/04/2018 12/07/2017 11/30/2017  Does Patient Have a Medical Advance Directive? Yes Yes Yes Yes Yes Yes Yes  Type of Paramedic of [place];Living will;Out of facility DNR (pink MOST or yellow form) [place];Living will;Out of facility DNR (pink MOST or yellow form) [place];Out of facility DNR (pink MOST or yellow form);Living will [place];Out of facility DNR (pink MOST or yellow form);Living will [place];Out of facility DNR (pink MOST or yellow form);Living will [place];Out of facility DNR (pink MOST or yellow form);Living will [place];Living will;Out of facility DNR (pink MOST or yellow form)  Does patient want to make changes to medical advance directive? No - Patient declined No - Patient declined No - Patient declined No - Patient declined - - -  Copy of [place] in Chart? Yes Yes Yes Yes Yes Yes Yes  Pre-existing out of facility DNR order (yellow form or pink MOST form) Yellow form placed in chart (order not valid for inpatient use) Yellow form placed in chart (order not valid for inpatient use) Yellow form placed in chart (order not valid for inpatient use) Yellow form placed in chart (order not valid for inpatient use) Yellow form placed in chart (order not valid for inpatient use) Yellow form placed in chart (order not valid for inpatient use) Yellow form  placed in chart (order not valid for inpatient use)    Tobacco Social History   Tobacco Use  Smoking Status Never Smoker  Smokeless Tobacco Never Used     Counseling given: Not Answered   Clinical Intake:  Pre-visit preparation completed: No  Pain : No/denies pain     Nutritional Risks: None Diabetes: No  How often do you need to have someone help you when you read instructions, pamphlets, or other written materials from your doctor or pharmacy?: 3 - Sometimes  Interpreter Needed?: No  Information entered by :: Tyson Dense, RN  Past Medical History:  Diagnosis Date  . Anemia 03/02/2012  . Angina pectoris, unstable ([place]) 03/02/2012  . Anxiety   . Axillary abscess   . Bradycardia 06/13/2012  . CAD (coronary artery disease)    S/p PTCA / stenting (last cath 2004, multiple LAD stents, 2 stents in the right coronary artery all patent)  . Cancer ([place])   . Complication of anesthesia   . Compression fracture of L2 ([place]) 03/12/2017  . Dementia   . Dysphagia 10/06/2012  . Dyspnea 02/03/2012  . GERD (gastroesophageal reflux disease)   . History of pulmonary embolus (PE) 03/19/2017  . HTN (hypertension)   . PMR (polymyalgia rheumatica) (HCC)   . PONV (postoperative nausea and vomiting)   . Restless leg syndrome   . 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 Started coumadin  . Weakness of both legs 06/09/2012   Past Surgical History:  Procedure Laterality  Date  . BREAST LUMPECTOMY    . COLECTOMY    . ESOPHAGOGASTRODUODENOSCOPY (EGD) WITH ESOPHAGEAL DILATION  10/10/2012   Procedure: ESOPHAGOGASTRODUODENOSCOPY (EGD) WITH ESOPHAGEAL DILATION;  Surgeon: Inda Castle, MD;  Location: [facility];  Service: Endoscopy;  Laterality: N/A;  . heart stents  x 8  . INCISION AND DRAINAGE     bilateral axillary, non specific staff  . INCISION AND DRAINAGE ABSCESS  09/28/2012   Procedure: INCISION AND DRAINAGE ABSCESS;  Surgeon: Zenovia Jarred, MD;  Location: [facility];  Service:  General;  Laterality: Bilateral;  . stent     cardiac x 8 stents.   Family History  Problem Relation Age of Onset  . Heart failure Mother   . Stroke Father   . Breast cancer Sister   . Heart disease Sister   . Heart disease Brother    Social History   Socioeconomic History  . Marital status: Widowed    Spouse name: Not on file  . Number of children: Not on file  . Years of education: Not on file  . Highest education level: Not on file  Occupational History  . Occupation: housewife  Social Needs  . Financial resource strain: Not hard at all  . Food insecurity:    Worry: Never true    Inability: Never true  . Transportation needs:    Medical: No    Non-medical: No  Tobacco Use  . Smoking status: Never Smoker  . Smokeless tobacco: Never Used  Substance and Sexual Activity  . Alcohol use: No  . Drug use: No  . Sexual activity: Never  Lifestyle  . Physical activity:    Days per week: 0 days    Minutes per session: 0 min  . Stress: Only a little  Relationships  . Social connections:    Talks on phone: Twice a week    Gets together: Twice a week    Attends religious service: Never    Active member of club or organization: No    Attends meetings of clubs or organizations: Never    Relationship status: Widowed  Other Topics Concern  . Not on file  Social History Narrative   She lives at home with her son and is still ambulatory daily with a cane or walker. She denies any history of smoking. Marland Kitchen.Mother died at 28 from a myocardial infarction.  Father    died at 6 from a stroke, possible history of PE. She has nine brothers and    sisters, one sister with bypass and a brother with bypass, also has a    brother who died abruptly of a pulmonary embolism.      Admitted to [facility] 03/16/17   Alcohol none   DNR       Outpatient Encounter Medications as of 03/14/2018  Medication Sig  . ALPRAZolam (XANAX) 0.5 MG tablet Take 0.5 mg by mouth daily.   Marland Kitchen  amLODipine (NORVASC) 10 MG tablet Take 10 mg by mouth every morning.   Marland Kitchen aspirin EC 81 MG tablet Take 1 tablet (81 mg total) by mouth daily.  Marland Kitchen atorvastatin (LIPITOR) 10 MG tablet Take 10 mg by mouth every evening.  . cholecalciferol (VITAMIN D) 1000 UNITS tablet Take 2,000 Units by mouth daily with lunch. 2 tabs  . donepezil (ARICEPT) 10 MG tablet Take 10 mg by mouth every morning.  . fluticasone (FLONASE) 50 MCG/ACT nasal spray Place 1 spray into both nostrils daily as needed for allergies  or rhinitis.  . hydrALAZINE (APRESOLINE) 25 MG tablet Take 1 tablet (25 mg total) by mouth every 8 (eight) hours.  Marland Kitchen losartan (COZAAR) 50 MG tablet Take 50 mg by mouth at bedtime.   . mirtazapine (REMERON) 15 MG tablet Take 7.5 mg by mouth at bedtime.   . nitroGLYCERIN (NITROSTAT) 0.4 MG SL tablet Place 0.4 mg under the tongue every 5 (five) minutes as needed for chest pain.  Marland Kitchen ondansetron (ZOFRAN) 4 MG tablet Take 4 mg by mouth every 4 (four) hours as needed for nausea.   Marland Kitchen oxybutynin (DITROPAN) 5 MG tablet Take 5 mg by mouth 2 (two) times daily.   . pantoprazole (PROTONIX) 40 MG tablet Take 40 mg by mouth 2 (two) times daily as needed (for heartburn).  . potassium chloride SA (K-DUR,KLOR-CON) 20 MEQ tablet Take 20 mEq by mouth daily with lunch.   . predniSONE (DELTASONE) 1 MG tablet Take 1 mg by mouth daily with breakfast.   . rOPINIRole (REQUIP) 1 MG tablet Take 1 mg by mouth. 1mg  at noon, 1mg  2 hours before bed, then 1mg  at bedtime  . sertraline (ZOLOFT) 25 MG tablet Take 25 mg by mouth every morning.   Marland Kitchen SILENOR 6 MG TABS Take 6 mg by mouth at bedtime.   . traMADol (ULTRAM) 50 MG tablet Take 50 mg by mouth at bedtime.  Marland Kitchen warfarin (COUMADIN) 3 MG tablet Take 3 mg by mouth daily.    No facility-administered encounter medications on file as of 03/14/2018.     Activities of Daily Living In your present state of health, do you have any difficulty performing the following activities: 03/14/2018  Hearing? Y   Vision? N  Difficulty concentrating or making decisions? Y  Walking or climbing stairs? Y  Dressing or bathing? Y  Doing errands, shopping? Y  Preparing Food and eating ? Y  Using the Toilet? Y  In the past six months, have you accidently leaked urine? N  Do you have problems with loss of bowel control? N  Managing your Medications? Y  Managing your Finances? Y  Housekeeping or managing your Housekeeping? Y  Some recent data might be hidden    Patient Care Team: Hennie Duos, MD as PCP - General (Internal Medicine)    Assessment:   This is a routine wellness examination for [place].  Exercise Activities and Dietary recommendations Current Exercise Habits: The patient does not participate in regular exercise at present, Exercise limited by: neurologic condition(s);orthopedic condition(s)  Goals    None      Fall Risk Fall Risk  03/14/2018  Falls in the past year? No   Is the patient's home free of loose throw rugs in walkways, pet beds, electrical cords, etc?   yes      Grab bars in the bathroom? yes      Handrails on the stairs?   yes      Adequate lighting?   yes   Depression Screen PHQ 2/9 Scores 03/14/2018  PHQ - 2 Score 0     Cognitive Function     6CIT Screen 03/14/2018  What Year? 0 points  What month? 0 points  What time? 0 points  Count back from 20 0 points  Months in reverse 4 points  Repeat phrase 2 points  Total Score 6    Immunization History  Administered Date(s) Administered  . Influenza-Unspecified 08/01/2016, 08/11/2017  . PPD Test 03/18/2017  . Pneumococcal Conjugate-13 09/17/2017    Qualifies for Shingles Vaccine? Not  in past records  Screening Tests Health Maintenance  Topic Date Due  . TETANUS/TDAP  09/27/2018 (Originally 07/23/1940)  . DEXA SCAN  10/27/2018 (Originally 07/23/1988)  . INFLUENZA VACCINE  06/01/2018  . PNA vac Low Risk Adult (2 of 2 - PPSV23) 09/17/2018    Cancer Screenings: Lung: Low Dose CT Chest  recommended if Age 32-80 years, 30 pack-year currently smoking OR have quit w/in 15years. Patient does not qualify. Breast:  Up to date on Mammogram? Yes   Up to date of Bone Density/Dexa? No, ordered Colorectal: up to date  Additional Screenings:  Hepatitis C Screening: declined TDAP due-ordered     Plan:    I have personally reviewed and addressed the Medicare Annual Wellness questionnaire and have noted the following in the patient's chart:  A. Medical and social history B. Use of alcohol, tobacco or illicit drugs  C. Current medications and supplements D. Functional ability and status E.  Nutritional status F.  Physical activity G. Advance directives H. List of other physicians I.  Hospitalizations, surgeries, and ER visits in previous 12 months J.  [place] to include hearing, vision, cognitive, depression L. Referrals and appointments - none  In addition, I have reviewed and discussed with patient certain preventive protocols, quality metrics, and best practice recommendations. A written personalized care plan for preventive services as well as general preventive health recommendations were provided to patient.  See attached scanned questionnaire for additional information.   Signed,   Tyson Dense, RN Nurse Health Advisor  Patient Concerns: None

## 2018-03-25 ENCOUNTER — Encounter: Payer: Self-pay | Admitting: Internal Medicine

## 2018-03-25 NOTE — Assessment & Plan Note (Signed)
Control; continue Xanax 0.25 mg p.o. daily

## 2018-03-25 NOTE — Assessment & Plan Note (Signed)
LDL 71 HDL 36; good control; continue Lipitor 10 mg daily

## 2018-03-25 NOTE — Assessment & Plan Note (Signed)
Continues with no reports of problems or chest pain; continue Coumadin for stent status, ASA 81 mg daily, sublingual nitroglycerin when necessary; patient is on statin

## 2018-03-28 ENCOUNTER — Encounter: Payer: Self-pay | Admitting: Internal Medicine

## 2018-03-28 ENCOUNTER — Non-Acute Institutional Stay (SKILLED_NURSING_FACILITY): Payer: Medicare Other | Admitting: Internal Medicine

## 2018-03-28 DIAGNOSIS — I10 Essential (primary) hypertension: Secondary | ICD-10-CM | POA: Diagnosis not present

## 2018-03-28 DIAGNOSIS — G301 Alzheimer's disease with late onset: Secondary | ICD-10-CM | POA: Diagnosis not present

## 2018-03-28 DIAGNOSIS — I8290 Acute embolism and thrombosis of unspecified vein: Secondary | ICD-10-CM | POA: Diagnosis not present

## 2018-03-28 DIAGNOSIS — F0280 Dementia in other diseases classified elsewhere without behavioral disturbance: Secondary | ICD-10-CM | POA: Diagnosis not present

## 2018-03-28 NOTE — Progress Notes (Signed)
Location:  [facility] Room Number: [phone] Place of Service:  SNF (31)  Hennie Duos, MD  Patient Care Team: Hennie Duos, MD as PCP - General (Internal Medicine)  Extended Emergency Contact Information Primary Emergency Contact: Twiggs,[name] Address: [address]          [place], [place] [address] [name] of [place] Phone: [phone] Mobile Phone: [phone] Relation: Son Secondary Emergency Contact: [place] of [place] Phone: [phone] Relation: Daughter    Allergies: Ambien [zolpidem tartrate]; Codeine phosphate; Darvocet [propoxyphene n-acetaminophen]; Fish-derived products; Promethazine hcl; Sulfamethoxazole; Vicodin [hydrocodone-acetaminophen]; Amoxicillin; and Penicillins  Chief Complaint  Patient presents with  . Medical Management of Chronic Issues    Routine Visit    HPI: Patient is 84 y.o. female who is being seen for routine issues of DVT, hypertension, and dementia.  Past Medical History:  Diagnosis Date  . Anemia 03/02/2012  . Angina pectoris, unstable ([place]) 03/02/2012  . Anxiety   . Axillary abscess   . Bradycardia 06/13/2012  . CAD (coronary artery disease)    S/p PTCA / stenting (last cath 2004, multiple LAD stents, 2 stents in the right coronary artery all patent)  . Cancer ([place])   . Complication of anesthesia   . Compression fracture of L2 ([facility]) 03/12/2017  . Dementia   . Dysphagia 10/06/2012  . Dyspnea 02/03/2012  . GERD (gastroesophageal reflux disease)   . History of pulmonary embolus (PE) 03/19/2017  . HTN (hypertension)   . PMR (polymyalgia rheumatica) (HCC)   . PONV (postoperative nausea and vomiting)   . Restless leg syndrome   . 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 Started coumadin  . Weakness of both legs 06/09/2012    Past Surgical History:  Procedure Laterality Date  . BREAST LUMPECTOMY    . COLECTOMY    . ESOPHAGOGASTRODUODENOSCOPY  (EGD) WITH ESOPHAGEAL DILATION  10/10/2012   Procedure: ESOPHAGOGASTRODUODENOSCOPY (EGD) WITH ESOPHAGEAL DILATION;  Surgeon: Inda Castle, MD;  Location: [facility];  Service: Endoscopy;  Laterality: N/A;  . heart stents  x 8  . INCISION AND DRAINAGE     bilateral axillary, non specific staff  . INCISION AND DRAINAGE ABSCESS  09/28/2012   Procedure: INCISION AND DRAINAGE ABSCESS;  Surgeon: Zenovia Jarred, MD;  Location: [facility];  Service: General;  Laterality: Bilateral;  . stent     cardiac x 8 stents.    Allergies as of 03/28/2018      Reactions   Ambien [zolpidem Tartrate] Nausea And Vomiting   Codeine Phosphate Nausea And Vomiting   Darvocet [propoxyphene N-acetaminophen] Nausea And Vomiting   Fish-derived Products    Promethazine Hcl Nausea And Vomiting   Sulfamethoxazole Other (See Comments)   Pt doesn't remember reaction   Vicodin [hydrocodone-acetaminophen] Other (See Comments)   Unknown reaction   Amoxicillin Nausea And Vomiting, Rash   Penicillins Nausea And Vomiting, Rash      Medication List        Accurate as of 03/28/18 11:59 PM. Always use your most recent med list.          ALPRAZolam 0.5 MG tablet Commonly known as:  XANAX Take 0.5 mg by mouth daily.   amLODipine 10 MG tablet Commonly known as:  NORVASC Take 10 mg by mouth every morning.   aspirin EC 81 MG tablet Take 1 tablet (81 mg total) by mouth daily.   atorvastatin 10 MG tablet Commonly known as:  LIPITOR Take 10 mg by mouth every evening.   cholecalciferol 1000 units tablet Commonly known as:  VITAMIN D Take 2,000 Units by mouth daily with lunch. 2 tabs   donepezil 10 MG tablet Commonly known as:  ARICEPT Take 10 mg by mouth every evening.   fluticasone 50 MCG/ACT nasal spray Commonly known as:  FLONASE Place 1 spray into both nostrils daily as needed for allergies or rhinitis.   hydrALAZINE 25 MG tablet Commonly known as:  APRESOLINE Take 1 tablet (25 mg total) by mouth every  8 (eight) hours.   losartan 50 MG tablet Commonly known as:  COZAAR Take 50 mg by mouth at bedtime.   mirtazapine 15 MG tablet Commonly known as:  REMERON Take 7.5 mg by mouth at bedtime.   nitroGLYCERIN 0.4 MG SL tablet Commonly known as:  NITROSTAT Place 0.4 mg under the tongue every 5 (five) minutes as needed for chest pain.   ondansetron 4 MG tablet Commonly known as:  ZOFRAN Take 4 mg by mouth every 4 (four) hours as needed for nausea.   oxybutynin 5 MG tablet Commonly known as:  DITROPAN Take 5 mg by mouth 2 (two) times daily.   potassium chloride SA 20 MEQ tablet Commonly known as:  K-DUR,KLOR-CON Take 20 mEq by mouth daily with lunch.   predniSONE 1 MG tablet Commonly known as:  DELTASONE Take 1 mg by mouth daily with breakfast.   rOPINIRole 1 MG tablet Commonly known as:  REQUIP Take 1 mg by mouth. 1mg  at noon, 1mg  2 hours before bed, then 1mg  at bedtime   sertraline 25 MG tablet Commonly known as:  ZOLOFT Take 25 mg by mouth every morning.   SILENOR 6 MG Tabs Generic drug:  Doxepin HCl Take 6 mg by mouth at bedtime.   traMADol 50 MG tablet Commonly known as:  ULTRAM Take 50 mg by mouth at bedtime.   warfarin 3 MG tablet Commonly known as:  COUMADIN Take 3 mg by mouth daily.       No orders of the defined types were placed in this encounter.   Immunization History  Administered Date(s) Administered  . Influenza-Unspecified 08/01/2016, 08/11/2017  . PPD Test 03/18/2017  . Pneumococcal Conjugate-13 09/17/2017    Social History   Tobacco Use  . Smoking status: Never Smoker  . Smokeless tobacco: Never Used  Substance Use Topics  . Alcohol use: No    Review of Systems  DATA OBTAINED: from patient, nurse GENERAL:  no fevers, fatigue, appetite changes SKIN: No itching, rash HEENT: No complaint RESPIRATORY: No cough, wheezing, SOB CARDIAC: No chest pain, palpitations, lower extremity edema  GI: No abdominal pain, No N/V/D or  constipation, No heartburn or reflux  GU: No dysuria, frequency or urgency, or incontinence  MUSCULOSKELETAL: No unrelieved bone/joint pain NEUROLOGIC: No headache, dizziness  PSYCHIATRIC: No overt anxiety or sadness  Vitals:   03/28/18 1402  BP: 120/68  Pulse: 77  Resp: 18  Temp: 97.9 F (36.6 C)  SpO2: 99%   Body mass index is 23.69 kg/m. Physical Exam  GENERAL APPEARANCE: Alert, conversant, No acute distress  SKIN: No diaphoresis rash HEENT: Unremarkable RESPIRATORY: Breathing is even, unlabored. Lung sounds are clear   CARDIOVASCULAR: Heart RRR no murmurs, rubs or gallops. No peripheral edema  GASTROINTESTINAL: Abdomen is soft, non-tender, not distended w/ normal bowel sounds.  GENITOURINARY: Bladder non tender, not distended  MUSCULOSKELETAL: No abnormal joints or musculature NEUROLOGIC: Cranial nerves 2-12 grossly intact. Moves all extremities PSYCHIATRIC: Mood  and affect with dementia, no behavioral issues  Patient Active Problem List   Diagnosis Date Noted  . Overactive bladder 09/24/2017  . Insomnia 07/31/2017  . Anxiety 06/16/2017  . Citrobacter infection 03/19/2017  . AKI (acute kidney injury) ([facility] [place]) 03/19/2017  . History of pulmonary embolus (PE) 03/19/2017  . Elevated INR 03/19/2017  . Candidal intertrigo 03/19/2017  . Compression fracture of L2 ([facility]) 03/12/2017  . Acute lower UTI 03/12/2017  . Accelerated hypertension 03/12/2017  . Leukocytosis 07/31/2015  . Acute bronchitis 07/28/2015  . Chronic anticoagulation 07/28/2015  . Intractable back pain 07/20/2015  . Back pain 07/20/2015  . Cellulitis and abscess of left buttock 04/12/2013  . Stricture and stenosis of esophagus 10/10/2012  . Dementia 10/06/2012  . Dysphagia 10/06/2012  . Axillary abscess - bilateral, multiple 09/27/2012  . Hypotension 08/24/2012  . Near syncope 08/24/2012  . Abscess of axilla, left 06/27/2012  . Bradycardia 06/13/2012  . Abscess 06/11/2012  . Chest pain 06/09/2012  .  Cellulitis 06/09/2012  . Weakness of both legs 06/09/2012  . PMR (polymyalgia rheumatica) (HCC)   . Restless leg syndrome   . Angina pectoris, unstable ([place]) 03/02/2012  . Anemia 03/02/2012  . Dyspnea 02/03/2012  . UTI (urinary tract infection) 02/03/2012  . CAD (coronary artery disease)   . HTN (hypertension)   . VTE (venous thromboembolism) 10/01/2010  . HYPERCHOLESTEROLEMIA  IIA 08/27/2009  . Hyperlipidemia 12/11/2008  . HYPERTENSION, BENIGN 12/11/2008    CMP     Component Value Date/Time   NA 139 09/27/2017   K 4.5 09/27/2017   CL 111 03/16/2017 0400   CO2 21 (L) 03/16/2017 0400   GLUCOSE 119 (H) 03/16/2017 0400   BUN 24 (A) 09/27/2017   CREATININE 1.0 09/27/2017   CREATININE 1.03 (H) 03/16/2017 0400   CALCIUM 8.7 (L) 03/16/2017 0400   PROT 6.7 03/11/2017 2031   ALBUMIN 3.4 (L) 03/11/2017 2031   AST 13 09/27/2017   ALT 9 09/27/2017   ALKPHOS 59 09/27/2017   BILITOT 0.5 03/11/2017 2031   GFRNONAA 45 (L) 03/16/2017 0400   GFRAA 53 (L) 03/16/2017 0400   Recent Labs    09/27/17  NA 139  K 4.5  BUN 24*  CREATININE 1.0   Recent Labs    09/27/17  AST 13  ALT 9  ALKPHOS 59   Recent Labs    09/27/17  WBC 6.7  HGB 10.0*  HCT 32*  PLT 250   Recent Labs    09/27/17  CHOL 129  LDLCALC 71  TRIG 111   No results found for: [facility] Lab Results  Component Value Date   TSH 1.35 09/27/2017   Lab Results  Component Value Date   HGBA1C 5.5 09/27/2017   Lab Results  Component Value Date   CHOL 129 09/27/2017   HDL 36 09/27/2017   LDLCALC 71 09/27/2017   TRIG 111 09/27/2017   CHOLHDL 2.4 03/03/2012    Significant Diagnostic Results in last 30 days:  No results found.  Assessment and Plan  VTE (venous thromboembolism) Stable, so patient remains on Coumadin which is actively titrated  HTN (hypertension) Controlled; continue Cozaar 50 mg daily, hydralazine 25 mg 3 times daily and Norvasc 10 mg daily  Dementia Continues stable without major  declines; continue Aricept 10 mg daily    Anne D. [name], MD

## 2018-03-30 ENCOUNTER — Other Ambulatory Visit: Payer: Self-pay | Admitting: Internal Medicine

## 2018-03-30 DIAGNOSIS — E2839 Other primary ovarian failure: Secondary | ICD-10-CM

## 2018-04-21 ENCOUNTER — Non-Acute Institutional Stay (SKILLED_NURSING_FACILITY): Payer: Medicare Other | Admitting: Internal Medicine

## 2018-04-21 ENCOUNTER — Encounter: Payer: Self-pay | Admitting: Internal Medicine

## 2018-04-21 DIAGNOSIS — G2581 Restless legs syndrome: Secondary | ICD-10-CM | POA: Diagnosis not present

## 2018-04-21 DIAGNOSIS — M353 Polymyalgia rheumatica: Secondary | ICD-10-CM

## 2018-04-21 DIAGNOSIS — N3281 Overactive bladder: Secondary | ICD-10-CM

## 2018-04-21 NOTE — Progress Notes (Signed)
Location:  [facility] Room Number: [place]:  [id number] [phone])  Hennie Duos, MD  Patient Care Team: Hennie Duos, MD as PCP - General (Internal Medicine)  Extended Emergency Contact Information Primary Emergency Contact: Manges,[name] Address: [address]          [place], [place] [address] [name] of [place] Phone: [phone] Mobile Phone: [phone] Relation: Son Secondary Emergency Contact: [place] of [place] Phone: [phone] Relation: Daughter    Allergies: Ambien [zolpidem tartrate]; Codeine phosphate; Darvocet [propoxyphene n-acetaminophen]; Fish-derived products; Promethazine hcl; Sulfamethoxazole; Vicodin [hydrocodone-acetaminophen]; Amoxicillin; and Penicillins  Chief Complaint  Patient presents with  . Medical Management of Chronic Issues    HPI: Patient is 84 y.o. female who is being seen for routine issues of overactive bladder, restless leg syndrome and polymyalgia rheumatica.  Past Medical History:  Diagnosis Date  . Anemia 03/02/2012  . Angina pectoris, unstable ([place]) 03/02/2012  . Anxiety   . Axillary abscess   . Bradycardia 06/13/2012  . CAD (coronary artery disease)    S/p PTCA / stenting (last cath 2004, multiple LAD stents, 2 stents in the right coronary artery all patent)  . Cancer ([facility])   . Complication of anesthesia   . Compression fracture of L2 ([facility]) 03/12/2017  . Dementia   . Dysphagia 10/06/2012  . Dyspnea 02/03/2012  . GERD (gastroesophageal reflux disease)   . History of pulmonary embolus (PE) 03/19/2017  . HTN (hypertension)   . PMR (polymyalgia rheumatica) (HCC)   . PONV (postoperative nausea and vomiting)   . Restless leg syndrome   . 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 Started coumadin  . Weakness of both legs 06/09/2012    Past Surgical History:  Procedure Laterality Date  . BREAST LUMPECTOMY    . COLECTOMY    .  ESOPHAGOGASTRODUODENOSCOPY (EGD) WITH ESOPHAGEAL DILATION  10/10/2012   Procedure: ESOPHAGOGASTRODUODENOSCOPY (EGD) WITH ESOPHAGEAL DILATION;  Surgeon: Inda Castle, MD;  Location: [facility];  Service: Endoscopy;  Laterality: N/A;  . heart stents  x 8  . INCISION AND DRAINAGE     bilateral axillary, non specific staff  . INCISION AND DRAINAGE ABSCESS  09/28/2012   Procedure: INCISION AND DRAINAGE ABSCESS;  Surgeon: Zenovia Jarred, MD;  Location: [facility];  Service: General;  Laterality: Bilateral;  . stent     cardiac x 8 stents.    Allergies as of 04/21/2018      Reactions   Ambien [zolpidem Tartrate] Nausea And Vomiting   Codeine Phosphate Nausea And Vomiting   Darvocet [propoxyphene N-acetaminophen] Nausea And Vomiting   Fish-derived Products    Promethazine Hcl Nausea And Vomiting   Sulfamethoxazole Other (See Comments)   Pt doesn't remember reaction   Vicodin [hydrocodone-acetaminophen] Other (See Comments)   Unknown reaction   Amoxicillin Nausea And Vomiting, Rash   Penicillins Nausea And Vomiting, Rash      Medication List        Accurate as of 04/21/18 11:59 PM. Always use your most recent med list.          ALPRAZolam 0.5 MG tablet Commonly known as:  XANAX Take 0.5 mg by mouth daily.   amLODipine 10 MG tablet Commonly known as:  NORVASC Take 10 mg by mouth every morning.   aspirin EC 81 MG tablet Take 1 tablet (81 mg total) by mouth daily.   atorvastatin 10 MG tablet Commonly known as:  LIPITOR Take 10 mg by mouth every evening.   cholecalciferol 1000 units tablet Commonly known as:  VITAMIN D Take 2,000 Units by mouth daily with lunch. 2 tabs   donepezil 10 MG tablet Commonly known as:  ARICEPT Take 10 mg by mouth every evening.   fluticasone 50 MCG/ACT nasal spray Commonly known as:  FLONASE Place 1 spray into both nostrils daily as needed for allergies or rhinitis.   hydrALAZINE 25 MG tablet Commonly known as:  APRESOLINE Take 1 tablet  (25 mg total) by mouth every 8 (eight) hours.   losartan 50 MG tablet Commonly known as:  COZAAR Take 50 mg by mouth at bedtime.   mirtazapine 15 MG tablet Commonly known as:  REMERON Take 7.5 mg by mouth at bedtime.   nitroGLYCERIN 0.4 MG SL tablet Commonly known as:  NITROSTAT Place 0.4 mg under the tongue every 5 (five) minutes as needed for chest pain.   ondansetron 4 MG tablet Commonly known as:  ZOFRAN Take 4 mg by mouth every 4 (four) hours as needed for nausea.   oxybutynin 5 MG tablet Commonly known as:  DITROPAN Take 5 mg by mouth 2 (two) times daily.   potassium chloride SA 20 MEQ tablet Commonly known as:  K-DUR,KLOR-CON Take 20 mEq by mouth daily with lunch.   predniSONE 1 MG tablet Commonly known as:  DELTASONE Take 1 mg by mouth daily with breakfast.   rOPINIRole 1 MG tablet Commonly known as:  REQUIP Take 1 mg by mouth. 1mg  at noon, 1mg  2 hours before bed, then 1mg  at bedtime   sertraline 25 MG tablet Commonly known as:  ZOLOFT Take 25 mg by mouth every morning.   SILENOR 6 MG Tabs Generic drug:  Doxepin HCl Take 6 mg by mouth at bedtime.   traMADol 50 MG tablet Commonly known as:  ULTRAM Take 50 mg by mouth at bedtime.   warfarin 3 MG tablet Commonly known as:  COUMADIN Take 3 mg by mouth daily.       No orders of the defined types were placed in this encounter.   Immunization History  Administered Date(s) Administered  . Influenza-Unspecified 08/01/2016, 08/11/2017  . PPD Test 03/18/2017  . Pneumococcal Conjugate-13 09/17/2017    Social History   Tobacco Use  . Smoking status: Never Smoker  . Smokeless tobacco: Never Used  Substance Use Topics  . Alcohol use: No    Review of Systems  DATA OBTAINED: from patient, nurse, medical record, family member GENERAL:  no fevers, fatigue, appetite changes SKIN: No itching, rash HEENT: No complaint RESPIRATORY: No cough, wheezing, SOB CARDIAC: No chest pain, palpitations, lower  extremity edema  GI: No abdominal pain, No N/V/D or constipation, No heartburn or reflux  GU: No dysuria, frequency or urgency, or incontinence  MUSCULOSKELETAL: No unrelieved bone/joint pain NEUROLOGIC: No headache, dizziness  PSYCHIATRIC: No overt anxiety or sadness  Vitals:   04/21/18 1016  BP: 130/75  Pulse: 70  Resp: 18  Temp: (!) 97.1 F (36.2 C)   Body mass index is 23.81 kg/m. Physical Exam  GENERAL APPEARANCE: Alert, conversant, No acute distress  SKIN: No diaphoresis rash HEENT: Unremarkable RESPIRATORY: Breathing is even, unlabored. Lung sounds are clear   CARDIOVASCULAR: Heart RRR no murmurs, rubs or gallops. No peripheral edema  GASTROINTESTINAL: Abdomen is soft, non-tender, not distended w/ normal bowel sounds.  GENITOURINARY: Bladder non tender, not distended  MUSCULOSKELETAL: No abnormal joints or musculature NEUROLOGIC: Cranial nerves 2-12 grossly intact. Moves all extremities  PSYCHIATRIC: Mood and affect appropriate to situation with dementia, no behavioral issues  Patient Active Problem List   Diagnosis Date Noted  . Overactive bladder 09/24/2017  . Insomnia 07/31/2017  . Anxiety 06/16/2017  . Citrobacter infection 03/19/2017  . AKI (acute kidney injury) ([place]) 03/19/2017  . History of pulmonary embolus (PE) 03/19/2017  . Elevated INR 03/19/2017  . Candidal intertrigo 03/19/2017  . Compression fracture of L2 ([place]) 03/12/2017  . Acute lower UTI 03/12/2017  . Accelerated hypertension 03/12/2017  . Leukocytosis 07/31/2015  . Acute bronchitis 07/28/2015  . Chronic anticoagulation 07/28/2015  . Intractable back pain 07/20/2015  . Back pain 07/20/2015  . Cellulitis and abscess of left buttock 04/12/2013  . Stricture and stenosis of esophagus 10/10/2012  . Dementia 10/06/2012  . Dysphagia 10/06/2012  . Axillary abscess - bilateral, multiple 09/27/2012  . Hypotension 08/24/2012  . Near syncope 08/24/2012  . Abscess of axilla, left 06/27/2012  .  Bradycardia 06/13/2012  . Abscess 06/11/2012  . Chest pain 06/09/2012  . Cellulitis 06/09/2012  . Weakness of both legs 06/09/2012  . PMR (polymyalgia rheumatica) (HCC)   . Restless leg syndrome   . Angina pectoris, unstable ([place]) 03/02/2012  . Anemia 03/02/2012  . Dyspnea 02/03/2012  . UTI (urinary tract infection) 02/03/2012  . CAD (coronary artery disease)   . HTN (hypertension)   . VTE (venous thromboembolism) 10/01/2010  . HYPERCHOLESTEROLEMIA  IIA 08/27/2009  . Hyperlipidemia 12/11/2008  . HYPERTENSION, BENIGN 12/11/2008    CMP     Component Value Date/Time   NA 139 09/27/2017   K 4.5 09/27/2017   CL 111 03/16/2017 0400   CO2 21 (L) 03/16/2017 0400   GLUCOSE 119 (H) 03/16/2017 0400   BUN 24 (A) 09/27/2017   CREATININE 1.0 09/27/2017   CREATININE 1.03 (H) 03/16/2017 0400   CALCIUM 8.7 (L) 03/16/2017 0400   PROT 6.7 03/11/2017 2031   ALBUMIN 3.4 (L) 03/11/2017 2031   AST 13 09/27/2017   ALT 9 09/27/2017   ALKPHOS 59 09/27/2017   BILITOT 0.5 03/11/2017 2031   GFRNONAA 45 (L) 03/16/2017 0400   GFRAA 53 (L) 03/16/2017 0400   Recent Labs    09/27/17  NA 139  K 4.5  BUN 24*  CREATININE 1.0   Recent Labs    09/27/17  AST 13  ALT 9  ALKPHOS 59   Recent Labs    09/27/17  WBC 6.7  HGB 10.0*  HCT 32*  PLT 250   Recent Labs    09/27/17  CHOL 129  LDLCALC 71  TRIG 111   No results found for: [facility] Lab Results  Component Value Date   TSH 1.35 09/27/2017   Lab Results  Component Value Date   HGBA1C 5.5 09/27/2017   Lab Results  Component Value Date   CHOL 129 09/27/2017   HDL 36 09/27/2017   LDLCALC 71 09/27/2017   TRIG 111 09/27/2017   CHOLHDL 2.4 03/03/2012    Significant Diagnostic Results in last 30 days:  No results found.  Assessment and Plan  Overactive bladder Stable; continue Ditropan 5 mg twice daily  Restless leg syndrome Controlled; continue Requip 1 mg 3 times daily  PMR (polymyalgia rheumatica) No complaints of  pain; continue prednisone 1 mg daily    Dameshia Seybold D. [name], MD

## 2018-04-23 ENCOUNTER — Encounter: Payer: Self-pay | Admitting: Internal Medicine

## 2018-04-23 NOTE — Assessment & Plan Note (Signed)
Stable, so patient remains on Coumadin which is actively titrated

## 2018-04-23 NOTE — Assessment & Plan Note (Signed)
Controlled; continue Cozaar 50 mg daily, hydralazine 25 mg 3 times daily and Norvasc 10 mg daily

## 2018-04-23 NOTE — Assessment & Plan Note (Signed)
Continues stable without major declines; continue Aricept 10 mg daily

## 2018-05-13 ENCOUNTER — Encounter: Payer: Self-pay | Admitting: Internal Medicine

## 2018-05-13 NOTE — Assessment & Plan Note (Signed)
Controlled; continue Requip 1 mg 3 times daily

## 2018-05-13 NOTE — Assessment & Plan Note (Signed)
No complaints of pain; continue prednisone 1 mg daily

## 2018-05-13 NOTE — Assessment & Plan Note (Signed)
Stable; continue Ditropan 5 mg twice daily

## 2018-05-15 ENCOUNTER — Ambulatory Visit
Admission: RE | Admit: 2018-05-15 | Discharge: 2018-05-15 | Disposition: A | Discharge status: Home / Self Care | Payer: Medicare Other | Source: Ambulatory Visit | Attending: Internal Medicine | Admitting: Internal Medicine

## 2018-05-15 DIAGNOSIS — E2839 Other primary ovarian failure: Secondary | ICD-10-CM

## 2018-05-15 DIAGNOSIS — Z780 Asymptomatic menopausal state: Secondary | ICD-10-CM | POA: Diagnosis not present

## 2018-05-15 DIAGNOSIS — M810 Age-related osteoporosis without current pathological fracture: Secondary | ICD-10-CM | POA: Diagnosis not present

## 2018-05-16 ENCOUNTER — Encounter: Payer: Self-pay | Admitting: Internal Medicine

## 2018-05-16 ENCOUNTER — Non-Acute Institutional Stay (SKILLED_NURSING_FACILITY): Payer: Medicare Other | Admitting: Internal Medicine

## 2018-05-16 DIAGNOSIS — F5101 Primary insomnia: Secondary | ICD-10-CM

## 2018-05-16 DIAGNOSIS — Z86711 Personal history of pulmonary embolism: Secondary | ICD-10-CM

## 2018-05-16 DIAGNOSIS — G301 Alzheimer's disease with late onset: Secondary | ICD-10-CM

## 2018-05-16 DIAGNOSIS — F0280 Dementia in other diseases classified elsewhere without behavioral disturbance: Secondary | ICD-10-CM

## 2018-05-16 NOTE — Progress Notes (Signed)
Location:  [facility] Room Number: [phone] Place of Service:  SNF [phone])  [name] [name]. [name] Coil, MD  Patient Care Team: Hennie Duos, MD as PCP - General (Internal Medicine)  Extended Emergency Contact Information Primary Emergency Contact: Yochim,[name] Address: [address]          [place], [place] [address] [name] of [place] Phone: [phone] Mobile Phone: [phone] Relation: Son Secondary Emergency Contact: [place] Phone: [phone] Relation: Daughter    Allergies: Ambien [zolpidem tartrate]; Codeine phosphate; Darvocet [propoxyphene n-acetaminophen]; Fish-derived products; Promethazine hcl; Sulfamethoxazole; Vicodin [hydrocodone-acetaminophen]; Amoxicillin; and Penicillins  Chief Complaint  Patient presents with  . Medical Management of Chronic Issues    Routine Visit    HPI: Patient is 84 y.o. female who is being seen for routine issues of insomnia, history of PE, and dementia.  Past Medical History:  Diagnosis Date  . Anemia 03/02/2012  . Angina pectoris, unstable ([place]) 03/02/2012  . Anxiety   . Axillary abscess   . Bradycardia 06/13/2012  . CAD (coronary artery disease)    S/p PTCA / stenting (last cath 2004, multiple LAD stents, 2 stents in the right coronary artery all patent)  . Cancer ([place])   . Complication of anesthesia   . Compression fracture of L2 ([place]) 03/12/2017  . Dementia   . Dysphagia 10/06/2012  . Dyspnea 02/03/2012  . GERD (gastroesophageal reflux disease)   . History of pulmonary embolus (PE) 03/19/2017  . HTN (hypertension)   . PMR (polymyalgia rheumatica) (HCC)   . PONV (postoperative nausea and vomiting)   . Restless leg syndrome   . 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 Started coumadin  . Weakness of both legs 06/09/2012    Past Surgical History:  Procedure Laterality Date  . BREAST LUMPECTOMY    . COLECTOMY    .  ESOPHAGOGASTRODUODENOSCOPY (EGD) WITH ESOPHAGEAL DILATION  10/10/2012   Procedure: ESOPHAGOGASTRODUODENOSCOPY (EGD) WITH ESOPHAGEAL DILATION;  Surgeon: Inda Castle, MD;  Location: [facility];  Service: Endoscopy;  Laterality: N/A;  . heart stents  x 8  . INCISION AND DRAINAGE     bilateral axillary, non specific staff  . INCISION AND DRAINAGE ABSCESS  09/28/2012   Procedure: INCISION AND DRAINAGE ABSCESS;  Surgeon: Zenovia Jarred, MD;  Location: [facility];  Service: General;  Laterality: Bilateral;  . stent     cardiac x 8 stents.    Allergies as of 05/16/2018      Reactions   Ambien [zolpidem Tartrate] Nausea And Vomiting   Codeine Phosphate Nausea And Vomiting   Darvocet [propoxyphene N-acetaminophen] Nausea And Vomiting   Fish-derived Products    Promethazine Hcl Nausea And Vomiting   Sulfamethoxazole Other (See Comments)   Pt doesn't remember reaction   Vicodin [hydrocodone-acetaminophen] Other (See Comments)   Unknown reaction   Amoxicillin Nausea And Vomiting, Rash   Penicillins Nausea And Vomiting, Rash      Medication List        Accurate as of 05/16/18 11:59 PM. Always use your most recent med list.          ALPRAZolam 0.5 MG tablet Commonly known as:  XANAX Take 0.5 mg by mouth daily.   amLODipine 10 MG tablet Commonly known as:  NORVASC Take 10 mg by mouth every morning.   aspirin EC 81 MG tablet Take 1 tablet (81 mg total) by mouth daily.   atorvastatin 10 MG tablet  Commonly known as:  LIPITOR Take 10 mg by mouth every evening.   cholecalciferol 1000 units tablet Commonly known as:  VITAMIN D Take 2,000 Units by mouth daily with lunch. 2 tabs   COUMADIN 2.5 MG tablet Generic drug:  warfarin Take 2.5 mg by mouth daily.   donepezil 10 MG tablet Commonly known as:  ARICEPT Take 10 mg by mouth every evening.   fluticasone 50 MCG/ACT nasal spray Commonly known as:  FLONASE Place 1 spray into both nostrils daily as needed for allergies or  rhinitis.   hydrALAZINE 25 MG tablet Commonly known as:  APRESOLINE Take 1 tablet (25 mg total) by mouth every 8 (eight) hours.   losartan 50 MG tablet Commonly known as:  COZAAR Take 50 mg by mouth at bedtime.   mirtazapine 15 MG tablet Commonly known as:  REMERON Take 7.5 mg by mouth at bedtime.   nitroGLYCERIN 0.4 MG SL tablet Commonly known as:  NITROSTAT Place 0.4 mg under the tongue every 5 (five) minutes as needed for chest pain.   ondansetron 4 MG tablet Commonly known as:  ZOFRAN Take 4 mg by mouth every 4 (four) hours as needed for nausea.   oxybutynin 5 MG tablet Commonly known as:  DITROPAN Take 5 mg by mouth 2 (two) times daily.   potassium chloride SA 20 MEQ tablet Commonly known as:  K-DUR,KLOR-CON Take 20 mEq by mouth daily with lunch.   predniSONE 1 MG tablet Commonly known as:  DELTASONE Take 1 mg by mouth daily with breakfast.   rOPINIRole 1 MG tablet Commonly known as:  REQUIP Take 1 mg by mouth. 1mg  at noon, 1mg  2 hours before bed, then 1mg  at bedtime   sertraline 25 MG tablet Commonly known as:  ZOLOFT Take 25 mg by mouth every morning.   SILENOR 6 MG Tabs Generic drug:  Doxepin HCl Take 6 mg by mouth at bedtime.   traMADol 50 MG tablet Commonly known as:  ULTRAM Take 50 mg by mouth at bedtime.       No orders of the defined types were placed in this encounter.   Immunization History  Administered Date(s) Administered  . Influenza-Unspecified 08/01/2016, 08/11/2017  . PPD Test 03/18/2017  . Pneumococcal Conjugate-13 09/17/2017    Social History   Tobacco Use  . Smoking status: Never Smoker  . Smokeless tobacco: Never Used  Substance Use Topics  . Alcohol use: No    Review of Systems  DATA OBTAINED: from patient, nurse GENERAL:  no fevers, fatigue, appetite changes SKIN: No itching, rash HEENT: No complaint RESPIRATORY: No cough, wheezing, SOB CARDIAC: No chest pain, palpitations, lower extremity edema  GI: No  abdominal pain, No N/V/D or constipation, No heartburn or reflux  GU: No dysuria, frequency or urgency, or incontinence  MUSCULOSKELETAL: No unrelieved bone/joint pain NEUROLOGIC: No headache, dizziness  PSYCHIATRIC: No overt anxiety or sadness  Vitals:   05/16/18 1053  BP: 138/60  Pulse: (!) 54  Resp: 18  Temp: (!) 97.2 F (36.2 C)   Body mass index is 22.86 kg/m. Physical Exam  GENERAL APPEARANCE: Alert, conversant, No acute distress  SKIN: No diaphoresis rash HEENT: Unremarkable RESPIRATORY: Breathing is even, unlabored. Lung sounds are clear   CARDIOVASCULAR: Heart RRR no murmurs, rubs or gallops. No peripheral edema  GASTROINTESTINAL: Abdomen is soft, non-tender, not distended w/ normal bowel sounds.  GENITOURINARY: Bladder non tender, not distended  MUSCULOSKELETAL: No abnormal joints or musculature NEUROLOGIC: Cranial nerves 2-12 grossly intact. Moves all extremities  PSYCHIATRIC: Mood and affect dementia, no behavioral issues  Patient Active Problem List   Diagnosis Date Noted  . Overactive bladder 09/24/2017  . Insomnia 07/31/2017  . Anxiety 06/16/2017  . Citrobacter infection 03/19/2017  . AKI (acute kidney injury) ([place]) 03/19/2017  . History of pulmonary embolus (PE) 03/19/2017  . Elevated INR 03/19/2017  . Candidal intertrigo 03/19/2017  . Compression fracture of L2 ([place]) 03/12/2017  . Acute lower UTI 03/12/2017  . Accelerated hypertension 03/12/2017  . Leukocytosis 07/31/2015  . Acute bronchitis 07/28/2015  . Chronic anticoagulation 07/28/2015  . Intractable back pain 07/20/2015  . Back pain 07/20/2015  . Cellulitis and abscess of left buttock 04/12/2013  . Stricture and stenosis of esophagus 10/10/2012  . Dementia 10/06/2012  . Dysphagia 10/06/2012  . Axillary abscess - bilateral, multiple 09/27/2012  . Hypotension 08/24/2012  . Near syncope 08/24/2012  . Abscess of axilla, left 06/27/2012  . Bradycardia 06/13/2012  . Abscess 06/11/2012  . Chest  pain 06/09/2012  . Cellulitis 06/09/2012  . Weakness of both legs 06/09/2012  . PMR (polymyalgia rheumatica) (HCC)   . Restless leg syndrome   . Angina pectoris, unstable ([place]) 03/02/2012  . Anemia 03/02/2012  . Dyspnea 02/03/2012  . UTI (urinary tract infection) 02/03/2012  . CAD (coronary artery disease)   . HTN (hypertension)   . VTE (venous thromboembolism) 10/01/2010  . HYPERCHOLESTEROLEMIA  IIA 08/27/2009  . Hyperlipidemia 12/11/2008  . HYPERTENSION, BENIGN 12/11/2008    CMP     Component Value Date/Time   NA 139 09/27/2017   K 4.5 09/27/2017   CL 111 03/16/2017 0400   CO2 21 (L) 03/16/2017 0400   GLUCOSE 119 (H) 03/16/2017 0400   BUN 24 (A) 09/27/2017   CREATININE 1.0 09/27/2017   CREATININE 1.03 (H) 03/16/2017 0400   CALCIUM 8.7 (L) 03/16/2017 0400   PROT 6.7 03/11/2017 2031   ALBUMIN 3.4 (L) 03/11/2017 2031   AST 13 09/27/2017   ALT 9 09/27/2017   ALKPHOS 59 09/27/2017   BILITOT 0.5 03/11/2017 2031   GFRNONAA 45 (L) 03/16/2017 0400   GFRAA 53 (L) 03/16/2017 0400   Recent Labs    09/27/17  NA 139  K 4.5  BUN 24*  CREATININE 1.0   Recent Labs    09/27/17  AST 13  ALT 9  ALKPHOS 59   Recent Labs    09/27/17  WBC 6.7  HGB 10.0*  HCT 32*  PLT 250   Recent Labs    09/27/17  CHOL 129  LDLCALC 71  TRIG 111   No results found for: [facility] Lab Results  Component Value Date   TSH 1.35 09/27/2017   Lab Results  Component Value Date   HGBA1C 5.5 09/27/2017   Lab Results  Component Value Date   CHOL 129 09/27/2017   HDL 36 09/27/2017   LDLCALC 71 09/27/2017   TRIG 111 09/27/2017   CHOLHDL 2.4 03/03/2012    Significant Diagnostic Results in last 30 days:  Dg Bone Density (dxa)  Result Date: 05/15/2018 EXAM: DUAL X-RAY ABSORPTIOMETRY (DXA) FOR BONE MINERAL DENSITY IMPRESSION: Referring Physician:  Hennie Duos Your patient completed a BMD test using Lunar IDXA DXA system ( analysis version: 16 ) manufactured by [employer].  PATIENT: Name: [name] [name], [name] [name] Patient ID: [id number] Birth Date: 08-09-1921 Height: 58.5 in. Sex: Female Measured: 05/15/2018 Weight: 112.3 lbs. Indications: Advanced Age, Caucasian, Estrogen Deficient, Height Loss (781.91), History of Fracture (Adult) (V15.51), Postmenopausal, Zoloft Fractures: Left wrist, vertebrae, Wrist Treatments: Vitamin  D (E933.5) ASSESSMENT: The BMD measured at Forearm Radius 33% is 0.507 g/cm2 with a T-score of -4.3. This patient is considered osteoporotic according to [facility]) criteria. The scan quality is limited by patient's mobility. Lumbar spine was not utilized due to advanced degenerative changes. Site Region Measured Date Measured Age YA BMD Significant CHANGE T-score Right Forearm Radius 33% 05/15/2018 94.8 -4.3 0.507 g/cm2 DualFemur Total Left 05/15/2018 94.8 -3.3 0.587 g/cm2 DualFemur Total Mean 05/15/2018 94.8 -3.2 0.601 g/cm2 World Health Organization [facility]) criteria for post-menopausal, Caucasian Women: Normal       T-score at or above -1 SD Osteopenia   T-score between -1 and -2.5 SD Osteoporosis T-score at or below -2.5 SD RECOMMENDATION: 1. All patients should optimize calcium and vitamin D intake. 2. Consider FDA approved medical therapies in postmenopausal women and men aged 23 years and older, based on the following: a. A hip or vertebral (clinical or morphometric) fracture b. T- score < or = -2.5 at the femoral neck or spine after appropriate evaluation to exclude secondary causes c. Low bone mass (T-score between -1.0 and -2.5 at the femoral neck or spine) and a 10 year probability of a hip fracture > or = 3% or a 10 year probability of a major osteoporosis-related fracture > or = 20% based on the [place]-adapted WHO algorithm d. Clinician judgment and/or patient preferences may indicate treatment for people with 10-year fracture probabilities above or below these levels FOLLOW-UP: People with diagnosed cases of osteoporosis or at high risk for  fracture should have regular bone mineral density tests. For patients eligible for Medicare, routine testing is allowed once every 2 years. The testing frequency can be increased to one year for patients who have rapidly progressing disease, those who are receiving or discontinuing medical therapy to restore bone mass, or have additional risk factors. I have reviewed this report and agree with the above findings. [facility] Radiology Electronically Signed   By: Earle Gell M.D.   On: 05/15/2018 13:52    Assessment and Plan  Insomnia  no reports of problems; continue doxepin 6 mg p.o. nightly  History of pulmonary embolus (PE) No reported problems; continue Coumadin  Dementia Chronic and stable; continue Aricept 10 mg p.o. daily     Jory Welke D. [name] Coil, MD

## 2018-05-29 DIAGNOSIS — Z9181 History of falling: Secondary | ICD-10-CM | POA: Diagnosis not present

## 2018-05-29 DIAGNOSIS — M6281 Muscle weakness (generalized): Secondary | ICD-10-CM | POA: Diagnosis not present

## 2018-05-30 DIAGNOSIS — Z9181 History of falling: Secondary | ICD-10-CM | POA: Diagnosis not present

## 2018-05-30 DIAGNOSIS — F419 Anxiety disorder, unspecified: Secondary | ICD-10-CM | POA: Diagnosis not present

## 2018-05-30 DIAGNOSIS — F329 Major depressive disorder, single episode, unspecified: Secondary | ICD-10-CM | POA: Diagnosis not present

## 2018-05-30 DIAGNOSIS — M6281 Muscle weakness (generalized): Secondary | ICD-10-CM | POA: Diagnosis not present

## 2018-05-30 DIAGNOSIS — F0390 Unspecified dementia without behavioral disturbance: Secondary | ICD-10-CM | POA: Diagnosis not present

## 2018-06-01 DIAGNOSIS — Z9181 History of falling: Secondary | ICD-10-CM | POA: Diagnosis not present

## 2018-06-01 DIAGNOSIS — M6281 Muscle weakness (generalized): Secondary | ICD-10-CM | POA: Diagnosis not present

## 2018-06-02 DIAGNOSIS — Z9181 History of falling: Secondary | ICD-10-CM | POA: Diagnosis not present

## 2018-06-02 DIAGNOSIS — M6281 Muscle weakness (generalized): Secondary | ICD-10-CM | POA: Diagnosis not present

## 2018-06-05 DIAGNOSIS — M6281 Muscle weakness (generalized): Secondary | ICD-10-CM | POA: Diagnosis not present

## 2018-06-05 DIAGNOSIS — Z9181 History of falling: Secondary | ICD-10-CM | POA: Diagnosis not present

## 2018-06-06 DIAGNOSIS — Z9181 History of falling: Secondary | ICD-10-CM | POA: Diagnosis not present

## 2018-06-06 DIAGNOSIS — M6281 Muscle weakness (generalized): Secondary | ICD-10-CM | POA: Diagnosis not present

## 2018-06-07 DIAGNOSIS — M6281 Muscle weakness (generalized): Secondary | ICD-10-CM | POA: Diagnosis not present

## 2018-06-07 DIAGNOSIS — Z9181 History of falling: Secondary | ICD-10-CM | POA: Diagnosis not present

## 2018-06-08 DIAGNOSIS — Z9181 History of falling: Secondary | ICD-10-CM | POA: Diagnosis not present

## 2018-06-08 DIAGNOSIS — M6281 Muscle weakness (generalized): Secondary | ICD-10-CM | POA: Diagnosis not present

## 2018-06-09 ENCOUNTER — Encounter: Payer: Self-pay | Admitting: Internal Medicine

## 2018-06-09 DIAGNOSIS — Z9181 History of falling: Secondary | ICD-10-CM | POA: Diagnosis not present

## 2018-06-09 NOTE — Assessment & Plan Note (Signed)
no reports of problems; continue doxepin 6 mg p.o. nightly

## 2018-06-09 NOTE — Assessment & Plan Note (Signed)
Chronic and stable; continue Aricept 10 mg p.o. daily

## 2018-06-09 NOTE — Assessment & Plan Note (Signed)
No reported problems; continue Coumadin

## 2018-06-12 DIAGNOSIS — M6281 Muscle weakness (generalized): Secondary | ICD-10-CM | POA: Diagnosis not present

## 2018-06-12 DIAGNOSIS — Z9181 History of falling: Secondary | ICD-10-CM | POA: Diagnosis not present

## 2018-06-21 ENCOUNTER — Encounter: Payer: Self-pay | Admitting: Internal Medicine

## 2018-06-21 ENCOUNTER — Non-Acute Institutional Stay (SKILLED_NURSING_FACILITY): Payer: Medicare Other | Admitting: Internal Medicine

## 2018-06-21 DIAGNOSIS — E785 Hyperlipidemia, unspecified: Secondary | ICD-10-CM | POA: Diagnosis not present

## 2018-06-21 DIAGNOSIS — I10 Essential (primary) hypertension: Secondary | ICD-10-CM

## 2018-06-21 DIAGNOSIS — I2510 Atherosclerotic heart disease of native coronary artery without angina pectoris: Secondary | ICD-10-CM | POA: Diagnosis not present

## 2018-06-21 NOTE — Progress Notes (Signed)
Location:  [facility] Room Number: [id number] Place of Service:  SNF [phone])  [name] [name]. [name] Coil, MD  Patient Care Team: Hennie Duos, MD as PCP - General (Internal Medicine)  Extended Emergency Contact Information Primary Emergency Contact: [place],[name] Address: [address]          [place], [place] [address] [name] of [place] Phone: [phone] Mobile Phone: [phone] Relation: Son Secondary Emergency Contact: [place] of [place] Phone: [phone] Relation: Daughter    Allergies: Ambien [zolpidem tartrate]; Codeine phosphate; Darvocet [propoxyphene n-acetaminophen]; Fish-derived products; Promethazine hcl; Sulfamethoxazole; Vicodin [hydrocodone-acetaminophen]; Amoxicillin; and Penicillins  Chief Complaint  Patient presents with  . Medical Management of Chronic Issues    Routine Visit    HPI: Patient is 84 y.o. female who is being seen for routine issues of hyperlipidemia, coronary artery disease, and hypertension.  Past Medical History:  Diagnosis Date  . Anemia 03/02/2012  . Angina pectoris, unstable ([place]) 03/02/2012  . Anxiety   . Axillary abscess   . Bradycardia 06/13/2012  . CAD (coronary artery disease)    S/p PTCA / stenting (last cath 2004, multiple LAD stents, 2 stents in the right coronary artery all patent)  . Cancer ([place])   . Complication of anesthesia   . Compression fracture of L2 ([place]) 03/12/2017  . Dementia   . Dysphagia 10/06/2012  . Dyspnea 02/03/2012  . GERD (gastroesophageal reflux disease)   . History of pulmonary embolus (PE) 03/19/2017  . HTN (hypertension)   . PMR (polymyalgia rheumatica) (HCC)   . PONV (postoperative nausea and vomiting)   . Restless leg syndrome   . 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 Started coumadin  . Weakness of both legs 06/09/2012    Past Surgical History:  Procedure Laterality Date  . BREAST LUMPECTOMY    . COLECTOMY    .  ESOPHAGOGASTRODUODENOSCOPY (EGD) WITH ESOPHAGEAL DILATION  10/10/2012   Procedure: ESOPHAGOGASTRODUODENOSCOPY (EGD) WITH ESOPHAGEAL DILATION;  Surgeon: Inda Castle, MD;  Location: [facility];  Service: Endoscopy;  Laterality: N/A;  . heart stents  x 8  . INCISION AND DRAINAGE     bilateral axillary, non specific staff  . INCISION AND DRAINAGE ABSCESS  09/28/2012   Procedure: INCISION AND DRAINAGE ABSCESS;  Surgeon: Zenovia Jarred, MD;  Location: [facility];  Service: General;  Laterality: Bilateral;  . stent     cardiac x 8 stents.    Allergies as of 06/21/2018      Reactions   Ambien [zolpidem Tartrate] Nausea And Vomiting   Codeine Phosphate Nausea And Vomiting   Darvocet [propoxyphene N-acetaminophen] Nausea And Vomiting   Fish-derived Products    Promethazine Hcl Nausea And Vomiting   Sulfamethoxazole Other (See Comments)   Pt doesn't remember reaction   Vicodin [hydrocodone-acetaminophen] Other (See Comments)   Unknown reaction   Amoxicillin Nausea And Vomiting, Rash   Penicillins Nausea And Vomiting, Rash      Medication List        Accurate as of 06/21/18 11:59 PM. Always use your most recent med list.          ALPRAZolam 0.5 MG tablet Commonly known as:  XANAX Take 0.5 mg by mouth daily.   amLODipine 10 MG tablet Commonly known as:  NORVASC Take 10 mg by mouth every morning.   aspirin EC 81 MG tablet Take 1 tablet (81 mg total) by mouth daily.   atorvastatin 10 MG tablet  Commonly known as:  LIPITOR Take 10 mg by mouth every evening.   cholecalciferol 1000 units tablet Commonly known as:  VITAMIN D Take 2,000 Units by mouth daily with lunch. 2 tabs   donepezil 10 MG tablet Commonly known as:  ARICEPT Take 10 mg by mouth every evening.   fluticasone 50 MCG/ACT nasal spray Commonly known as:  FLONASE Place 1 spray into both nostrils daily as needed for allergies or rhinitis.   hydrALAZINE 25 MG tablet Commonly known as:  APRESOLINE Take 1 tablet  (25 mg total) by mouth every 8 (eight) hours.   losartan 50 MG tablet Commonly known as:  COZAAR Take 50 mg by mouth at bedtime.   mirtazapine 15 MG tablet Commonly known as:  REMERON Take 7.5 mg by mouth at bedtime.   nitroGLYCERIN 0.4 MG SL tablet Commonly known as:  NITROSTAT Place 0.4 mg under the tongue every 5 (five) minutes as needed for chest pain.   ondansetron 4 MG tablet Commonly known as:  ZOFRAN Take 4 mg by mouth every 4 (four) hours as needed for nausea.   oxybutynin 5 MG tablet Commonly known as:  DITROPAN Take 5 mg by mouth 2 (two) times daily.   polyethylene glycol packet Commonly known as:  MIRALAX / GLYCOLAX Take 17 g by mouth daily as needed for moderate constipation.   potassium chloride SA 20 MEQ tablet Commonly known as:  K-DUR,KLOR-CON Take 20 mEq by mouth daily with lunch.   predniSONE 1 MG tablet Commonly known as:  DELTASONE Take 1 mg by mouth daily with breakfast.   rOPINIRole 1 MG tablet Commonly known as:  REQUIP Take 1 mg by mouth. 1mg  at noon, 1mg  2 hours before bed, then 1mg  at bedtime   sertraline 25 MG tablet Commonly known as:  ZOLOFT Take 25 mg by mouth every morning.   SILENOR 6 MG Tabs Generic drug:  Doxepin HCl Take 6 mg by mouth at bedtime.   traMADol 50 MG tablet Commonly known as:  ULTRAM Take 50 mg by mouth at bedtime.   TUMS 500 MG chewable tablet Generic drug:  calcium carbonate Chew 1 tablet by mouth 3 (three) times daily as needed for indigestion or heartburn.       No orders of the defined types were placed in this encounter.   Immunization History  Administered Date(s) Administered  . Influenza-Unspecified 08/01/2016, 08/11/2017  . PPD Test 03/18/2017  . Pneumococcal Conjugate-13 09/17/2017    Social History   Tobacco Use  . Smoking status: Never Smoker  . Smokeless tobacco: Never Used  Substance Use Topics  . Alcohol use: No    Review of Systems  DATA OBTAINED: from patient,  nurse GENERAL:  no fevers, fatigue, appetite changes SKIN: No itching, rash HEENT: No complaint RESPIRATORY: No cough, wheezing, SOB CARDIAC: No chest pain, palpitations, lower extremity edema  GI: No abdominal pain, No N/V/D or constipation, No heartburn or reflux  GU: No dysuria, frequency or urgency, or incontinence  MUSCULOSKELETAL: No unrelieved bone/joint pain NEUROLOGIC: No headache, dizziness  PSYCHIATRIC: No overt anxiety or sadness  Vitals:   06/21/18 1203  BP: 131/60  Pulse: (!) 54  Resp: 17  Temp: (!) 96.9 F (36.1 C)   Body mass index is 23.62 kg/m. Physical Exam  GENERAL APPEARANCE: Alert, conversant, No acute distress  SKIN: No diaphoresis rash HEENT: Unremarkable RESPIRATORY: Breathing is even, unlabored. Lung sounds are clear   CARDIOVASCULAR: Heart RRR no murmurs, rubs or gallops. No peripheral edema  GASTROINTESTINAL: Abdomen is soft, non-tender, not distended w/ normal bowel sounds.  GENITOURINARY: Bladder non tender, not distended  MUSCULOSKELETAL: No abnormal joints or musculature NEUROLOGIC: Cranial nerves 2-12 grossly intact. Moves all extremities PSYCHIATRIC: Mood and affect with dementia, no behavioral issues  Patient Active Problem List   Diagnosis Date Noted  . Overactive bladder 09/24/2017  . Insomnia 07/31/2017  . Anxiety 06/16/2017  . Citrobacter infection 03/19/2017  . AKI (acute kidney injury) ([place]) 03/19/2017  . History of pulmonary embolus (PE) 03/19/2017  . Elevated INR 03/19/2017  . Candidal intertrigo 03/19/2017  . Compression fracture of L2 ([place]) 03/12/2017  . Acute lower UTI 03/12/2017  . Accelerated hypertension 03/12/2017  . Leukocytosis 07/31/2015  . Acute bronchitis 07/28/2015  . Chronic anticoagulation 07/28/2015  . Intractable back pain 07/20/2015  . Back pain 07/20/2015  . Cellulitis and abscess of left buttock 04/12/2013  . Stricture and stenosis of esophagus 10/10/2012  . Dementia 10/06/2012  . Dysphagia  10/06/2012  . Axillary abscess - bilateral, multiple 09/27/2012  . Hypotension 08/24/2012  . Near syncope 08/24/2012  . Abscess of axilla, left 06/27/2012  . Bradycardia 06/13/2012  . Abscess 06/11/2012  . Chest pain 06/09/2012  . Cellulitis 06/09/2012  . Weakness of both legs 06/09/2012  . PMR (polymyalgia rheumatica) (HCC)   . Restless leg syndrome   . Angina pectoris, unstable ([place]) 03/02/2012  . Anemia 03/02/2012  . Dyspnea 02/03/2012  . UTI (urinary tract infection) 02/03/2012  . CAD (coronary artery disease)   . HTN (hypertension)   . VTE (venous thromboembolism) 10/01/2010  . HYPERCHOLESTEROLEMIA  IIA 08/27/2009  . Hyperlipidemia 12/11/2008  . HYPERTENSION, BENIGN 12/11/2008    CMP     Component Value Date/Time   NA 139 09/27/2017   K 4.5 09/27/2017   CL 111 03/16/2017 0400   CO2 21 (L) 03/16/2017 0400   GLUCOSE 119 (H) 03/16/2017 0400   BUN 24 (A) 09/27/2017   CREATININE 1.0 09/27/2017   CREATININE 1.03 (H) 03/16/2017 0400   CALCIUM 8.7 (L) 03/16/2017 0400   PROT 6.7 03/11/2017 2031   ALBUMIN 3.4 (L) 03/11/2017 2031   AST 13 09/27/2017   ALT 9 09/27/2017   ALKPHOS 59 09/27/2017   BILITOT 0.5 03/11/2017 2031   GFRNONAA 45 (L) 03/16/2017 0400   GFRAA 53 (L) 03/16/2017 0400   Recent Labs    09/27/17  NA 139  K 4.5  BUN 24*  CREATININE 1.0   Recent Labs    09/27/17  AST 13  ALT 9  ALKPHOS 59   Recent Labs    09/27/17  WBC 6.7  HGB 10.0*  HCT 32*  PLT 250   Recent Labs    09/27/17  CHOL 129  LDLCALC 71  TRIG 111   No results found for: [facility] Lab Results  Component Value Date   TSH 1.35 09/27/2017   Lab Results  Component Value Date   HGBA1C 5.5 09/27/2017   Lab Results  Component Value Date   CHOL 129 09/27/2017   HDL 36 09/27/2017   LDLCALC 71 09/27/2017   TRIG 111 09/27/2017   CHOLHDL 2.4 03/03/2012    Significant Diagnostic Results in last 30 days:  No results found.  Assessment and Plan  Hyperlipidemia Good  control; continue Lipitor 10 mg daily  CAD (coronary artery disease) No reports of chest pain; patient no longer on Coumadin secondary to age; continue ASA 81 mg daily and sublingual nitroglycerin as needed; patient is on statin  HTN (hypertension) Controlled; continue  Cozaar 50 mg daily, hydralazine 25 mg every 8 and Norvasc 10 mg daily     [name] Havlicek D. [name] Coil, MD

## 2018-06-24 ENCOUNTER — Encounter: Payer: Self-pay | Admitting: Internal Medicine

## 2018-06-24 NOTE — Assessment & Plan Note (Signed)
Good control; continue Lipitor 10 mg daily 

## 2018-06-24 NOTE — Assessment & Plan Note (Signed)
Controlled; continue Cozaar 50 mg daily, hydralazine 25 mg every 8 and Norvasc 10 mg daily

## 2018-06-24 NOTE — Assessment & Plan Note (Signed)
No reports of chest pain; patient no longer on Coumadin secondary to age; continue ASA 81 mg daily and sublingual nitroglycerin as needed; patient is on statin

## 2018-06-29 DIAGNOSIS — F419 Anxiety disorder, unspecified: Secondary | ICD-10-CM | POA: Diagnosis not present

## 2018-06-29 DIAGNOSIS — F0390 Unspecified dementia without behavioral disturbance: Secondary | ICD-10-CM | POA: Diagnosis not present

## 2018-06-29 DIAGNOSIS — F329 Major depressive disorder, single episode, unspecified: Secondary | ICD-10-CM | POA: Diagnosis not present

## 2018-07-18 ENCOUNTER — Non-Acute Institutional Stay (SKILLED_NURSING_FACILITY): Payer: Medicare Other | Admitting: Internal Medicine

## 2018-07-18 ENCOUNTER — Encounter: Payer: Self-pay | Admitting: Internal Medicine

## 2018-07-18 DIAGNOSIS — I8290 Acute embolism and thrombosis of unspecified vein: Secondary | ICD-10-CM | POA: Diagnosis not present

## 2018-07-18 DIAGNOSIS — N3281 Overactive bladder: Secondary | ICD-10-CM

## 2018-07-18 DIAGNOSIS — F419 Anxiety disorder, unspecified: Secondary | ICD-10-CM | POA: Diagnosis not present

## 2018-07-18 NOTE — Progress Notes (Signed)
Location:  [facility] Room Number: [phone] Place of Service:  SNF [phone])  [name] [name]. [name], MD  Patient Care Team: Hennie Duos, MD as PCP - General (Internal Medicine)  Extended Emergency Contact Information Primary Emergency Contact: [name],[name] Address: [address]          [place], [place] [address] [name] of [place] Phone: [phone] Mobile Phone: [phone] Relation: Son Secondary Emergency Contact: [name] of [place] Phone: [phone] Relation: Daughter    Allergies: Ambien [zolpidem tartrate]; Codeine phosphate; Darvocet [propoxyphene n-acetaminophen]; Fish-derived products; Promethazine hcl; Sulfamethoxazole; Vicodin [hydrocodone-acetaminophen]; Amoxicillin; and Penicillins  Chief Complaint  Patient presents with  . Medical Management of Chronic Issues    Routine Visit    HPI: Patient is 84 y.o. female who is being seen for routine issues of history of DVT, anxiety, and overactive bladder.  Past Medical History:  Diagnosis Date  . Anemia 03/02/2012  . Angina pectoris, unstable ([place]) 03/02/2012  . Anxiety   . Axillary abscess   . Bradycardia 06/13/2012  . CAD (coronary artery disease)    S/p PTCA / stenting (last cath 2004, multiple LAD stents, 2 stents in the right coronary artery all patent)  . Cancer ([place])   . Complication of anesthesia   . Compression fracture of L2 ([place]) 03/12/2017  . Dementia   . Dysphagia 10/06/2012  . Dyspnea 02/03/2012  . GERD (gastroesophageal reflux disease)   . History of pulmonary embolus (PE) 03/19/2017  . HTN (hypertension)   . PMR (polymyalgia rheumatica) (HCC)   . PONV (postoperative nausea and vomiting)   . Restless leg syndrome   . 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 Started coumadin  . Weakness of both legs 06/09/2012    Past Surgical History:  Procedure Laterality Date  . BREAST LUMPECTOMY    . COLECTOMY    .  ESOPHAGOGASTRODUODENOSCOPY (EGD) WITH ESOPHAGEAL DILATION  10/10/2012   Procedure: ESOPHAGOGASTRODUODENOSCOPY (EGD) WITH ESOPHAGEAL DILATION;  Surgeon: Inda Castle, MD;  Location: [facility];  Service: Endoscopy;  Laterality: N/A;  . heart stents  x 8  . INCISION AND DRAINAGE     bilateral axillary, non specific staff  . INCISION AND DRAINAGE ABSCESS  09/28/2012   Procedure: INCISION AND DRAINAGE ABSCESS;  Surgeon: Zenovia Jarred, MD;  Location: [facility];  Service: General;  Laterality: Bilateral;  . stent     cardiac x 8 stents.    Allergies as of 07/18/2018      Reactions   Ambien [zolpidem Tartrate] Nausea And Vomiting   Codeine Phosphate Nausea And Vomiting   Darvocet [propoxyphene N-acetaminophen] Nausea And Vomiting   Fish-derived Products    Promethazine Hcl Nausea And Vomiting   Sulfamethoxazole Other (See Comments)   Pt doesn't remember reaction   Vicodin [hydrocodone-acetaminophen] Other (See Comments)   Unknown reaction   Amoxicillin Nausea And Vomiting, Rash   Penicillins Nausea And Vomiting, Rash      Medication List        Accurate as of 07/18/18 11:59 PM. Always use your most recent med list.          ALPRAZolam 0.5 MG tablet Commonly known as:  XANAX Take 0.5 mg by mouth daily.   amLODipine 10 MG tablet Commonly known as:  NORVASC Take 10 mg by mouth every morning.   aspirin EC 81 MG tablet Take 1 tablet (81 mg total) by mouth daily.   atorvastatin 10 MG  tablet Commonly known as:  LIPITOR Take 10 mg by mouth every evening.   cholecalciferol 1000 units tablet Commonly known as:  VITAMIN D Take 2,000 Units by mouth daily with lunch. 2 tabs   COUMADIN 2.5 MG tablet Generic drug:  warfarin Take 2.5 mg by mouth daily.   donepezil 10 MG tablet Commonly known as:  ARICEPT Take 10 mg by mouth every evening.   fluticasone 50 MCG/ACT nasal spray Commonly known as:  FLONASE Place 1 spray into both nostrils daily as needed for allergies or  rhinitis.   hydrALAZINE 25 MG tablet Commonly known as:  APRESOLINE Take 1 tablet (25 mg total) by mouth every 8 (eight) hours.   losartan 50 MG tablet Commonly known as:  COZAAR Take 50 mg by mouth at bedtime.   mirtazapine 15 MG tablet Commonly known as:  REMERON Take 7.5 mg by mouth at bedtime.   nitroGLYCERIN 0.4 MG SL tablet Commonly known as:  NITROSTAT Place 0.4 mg under the tongue every 5 (five) minutes as needed for chest pain.   ondansetron 4 MG tablet Commonly known as:  ZOFRAN Take 4 mg by mouth every 4 (four) hours as needed for nausea.   oxybutynin 5 MG tablet Commonly known as:  DITROPAN Take 5 mg by mouth 2 (two) times daily.   polyethylene glycol packet Commonly known as:  MIRALAX / GLYCOLAX Take 17 g by mouth daily as needed for moderate constipation.   potassium chloride SA 20 MEQ tablet Commonly known as:  K-DUR,KLOR-CON Take 20 mEq by mouth daily with lunch.   predniSONE 1 MG tablet Commonly known as:  DELTASONE Take 1 mg by mouth daily with breakfast.   rOPINIRole 1 MG tablet Commonly known as:  REQUIP Take 1 mg by mouth. 1mg  at noon, 1mg  2 hours before bed, then 1mg  at bedtime   sertraline 25 MG tablet Commonly known as:  ZOLOFT Take 25 mg by mouth every morning.   SILENOR 6 MG Tabs Generic drug:  Doxepin HCl Take 6 mg by mouth at bedtime.   traMADol 50 MG tablet Commonly known as:  ULTRAM Take 50 mg by mouth at bedtime.   TUMS 500 MG chewable tablet Generic drug:  calcium carbonate Chew 1 tablet by mouth 3 (three) times daily as needed for indigestion or heartburn.       No orders of the defined types were placed in this encounter.   Immunization History  Administered Date(s) Administered  . Influenza-Unspecified 08/01/2016, 08/11/2017  . PPD Test 03/18/2017  . Pneumococcal Conjugate-13 09/17/2017    Social History   Tobacco Use  . Smoking status: Never Smoker  . Smokeless tobacco: Never Used  Substance Use Topics  .  Alcohol use: No    Review of Systems  DATA OBTAINED: from patient, nurse GENERAL:  no fevers, fatigue, appetite changes SKIN: No itching, rash HEENT: No complaint RESPIRATORY: No cough, wheezing, SOB CARDIAC: No chest pain, palpitations, lower extremity edema  GI: No abdominal pain, No N/V/D or constipation, No heartburn or reflux  GU: No dysuria, frequency or urgency, or incontinence  MUSCULOSKELETAL: No unrelieved bone/joint pain NEUROLOGIC: No headache, dizziness  PSYCHIATRIC: No overt anxiety or sadness  Vitals:   07/18/18 1526  BP: 133/72  Pulse: (!) 58  Resp: 17  Temp: 98.9 F (37.2 C)   Body mass index is 22.41 kg/m. Physical Exam  GENERAL APPEARANCE: Alert, conversant, No acute distress  SKIN: No diaphoresis rash HEENT: Unremarkable RESPIRATORY: Breathing is even, unlabored. Lung sounds  are clear   CARDIOVASCULAR: Heart RRR no murmurs, rubs or gallops. No peripheral edema  GASTROINTESTINAL: Abdomen is soft, non-tender, not distended w/ normal bowel sounds.  GENITOURINARY: Bladder non tender, not distended  MUSCULOSKELETAL: No abnormal joints or musculature NEUROLOGIC: Cranial nerves 2-12 grossly intact. Moves all extremities PSYCHIATRIC: Mood and affect with dementia, no behavioral issues  Patient Active Problem List   Diagnosis Date Noted  . Overactive bladder 09/24/2017  . Insomnia 07/31/2017  . Anxiety 06/16/2017  . Citrobacter infection 03/19/2017  . AKI (acute kidney injury) ([place]) 03/19/2017  . History of pulmonary embolus (PE) 03/19/2017  . Elevated INR 03/19/2017  . Candidal intertrigo 03/19/2017  . Compression fracture of L2 ([place]) 03/12/2017  . Acute lower UTI 03/12/2017  . Accelerated hypertension 03/12/2017  . Leukocytosis 07/31/2015  . Acute bronchitis 07/28/2015  . Chronic anticoagulation 07/28/2015  . Intractable back pain 07/20/2015  . Back pain 07/20/2015  . Cellulitis and abscess of left buttock 04/12/2013  . Stricture and stenosis of  esophagus 10/10/2012  . Dementia 10/06/2012  . Dysphagia 10/06/2012  . Axillary abscess - bilateral, multiple 09/27/2012  . Hypotension 08/24/2012  . Near syncope 08/24/2012  . Abscess of axilla, left 06/27/2012  . Bradycardia 06/13/2012  . Abscess 06/11/2012  . Chest pain 06/09/2012  . Cellulitis 06/09/2012  . Weakness of both legs 06/09/2012  . PMR (polymyalgia rheumatica) (HCC)   . Restless leg syndrome   . Angina pectoris, unstable ([place]) 03/02/2012  . Anemia 03/02/2012  . Dyspnea 02/03/2012  . UTI (urinary tract infection) 02/03/2012  . CAD (coronary artery disease)   . HTN (hypertension)   . VTE (venous thromboembolism) 10/01/2010  . HYPERCHOLESTEROLEMIA  IIA 08/27/2009  . Hyperlipidemia 12/11/2008  . HYPERTENSION, BENIGN 12/11/2008    CMP     Component Value Date/Time   NA 139 09/27/2017   K 4.5 09/27/2017   CL 111 03/16/2017 0400   CO2 21 (L) 03/16/2017 0400   GLUCOSE 119 (H) 03/16/2017 0400   BUN 24 (A) 09/27/2017   CREATININE 1.0 09/27/2017   CREATININE 1.03 (H) 03/16/2017 0400   CALCIUM 8.7 (L) 03/16/2017 0400   PROT 6.7 03/11/2017 2031   ALBUMIN 3.4 (L) 03/11/2017 2031   AST 13 09/27/2017   ALT 9 09/27/2017   ALKPHOS 59 09/27/2017   BILITOT 0.5 03/11/2017 2031   GFRNONAA 45 (L) 03/16/2017 0400   GFRAA 53 (L) 03/16/2017 0400   Recent Labs    09/27/17  NA 139  K 4.5  BUN 24*  CREATININE 1.0   Recent Labs    09/27/17  AST 13  ALT 9  ALKPHOS 59   Recent Labs    09/27/17  WBC 6.7  HGB 10.0*  HCT 32*  PLT 250   Recent Labs    09/27/17  CHOL 129  LDLCALC 71  TRIG 111   No results found for: [facility] Lab Results  Component Value Date   TSH 1.35 09/27/2017   Lab Results  Component Value Date   HGBA1C 5.5 09/27/2017   Lab Results  Component Value Date   CHOL 129 09/27/2017   HDL 36 09/27/2017   LDLCALC 71 09/27/2017   TRIG 111 09/27/2017   CHOLHDL 2.4 03/03/2012    Significant Diagnostic Results in last 30 days:  No  results found.  Assessment and Plan  VTE (venous thromboembolism) Stable; continue Coumadin which is actually titrated  Anxiety Controlled; continue Xanax 0.5 mg daily  Overactive bladder Stable; no recent UTIs; continue Ditropan 5 mg  twice daily     [name] [name]. [name], MD

## 2018-07-25 ENCOUNTER — Encounter: Payer: Self-pay | Admitting: Internal Medicine

## 2018-07-25 NOTE — Assessment & Plan Note (Signed)
Stable; continue Coumadin which is actually titrated

## 2018-07-25 NOTE — Assessment & Plan Note (Signed)
Stable; no recent UTIs; continue Ditropan 5 mg twice daily

## 2018-07-25 NOTE — Assessment & Plan Note (Signed)
Controlled; continue Xanax 0.5 mg daily

## 2018-08-01 DIAGNOSIS — F0390 Unspecified dementia without behavioral disturbance: Secondary | ICD-10-CM | POA: Diagnosis not present

## 2018-08-01 DIAGNOSIS — F329 Major depressive disorder, single episode, unspecified: Secondary | ICD-10-CM | POA: Diagnosis not present

## 2018-08-01 DIAGNOSIS — F419 Anxiety disorder, unspecified: Secondary | ICD-10-CM | POA: Diagnosis not present

## 2018-08-01 LAB — TSH: TSH: 1.71 (ref 0.41–5.90)

## 2018-08-01 LAB — HEPATIC FUNCTION PANEL
ALK PHOS: 58 (ref 25–125)
ALT: 7 (ref 7–35)
AST: 9 — AB (ref 13–35)
Bilirubin, Total: 0

## 2018-08-01 LAB — CBC AND DIFFERENTIAL
HCT: 30 — AB (ref 36–46)
HEMOGLOBIN: 9.5 — AB (ref 12.0–16.0)
Platelets: 246 (ref 150–399)
WBC: 6.3

## 2018-08-01 LAB — BASIC METABOLIC PANEL
BUN: 19 (ref 4–21)
CREATININE: 1.3 — AB (ref 0.5–1.1)
Glucose: 84
Potassium: 4.6 (ref 3.4–5.3)
Sodium: 141 (ref 137–147)

## 2018-08-01 LAB — LIPID PANEL
CHOLESTEROL: 140 (ref 0–200)
HDL: 46 (ref 35–70)
LDL Cholesterol: 80
Triglycerides: 82 (ref 40–160)

## 2018-08-01 LAB — HEMOGLOBIN A1C: Hemoglobin A1C: 5.7

## 2018-08-01 LAB — VITAMIN D 25 HYDROXY (VIT D DEFICIENCY, FRACTURES): VIT D 25 HYDROXY: 34.24

## 2018-08-15 ENCOUNTER — Non-Acute Institutional Stay (SKILLED_NURSING_FACILITY): Payer: Medicare Other | Admitting: Internal Medicine

## 2018-08-15 ENCOUNTER — Encounter: Payer: Self-pay | Admitting: Internal Medicine

## 2018-08-15 DIAGNOSIS — G2581 Restless legs syndrome: Secondary | ICD-10-CM | POA: Diagnosis not present

## 2018-08-15 DIAGNOSIS — M353 Polymyalgia rheumatica: Secondary | ICD-10-CM | POA: Diagnosis not present

## 2018-08-15 DIAGNOSIS — F5101 Primary insomnia: Secondary | ICD-10-CM

## 2018-08-15 NOTE — Progress Notes (Signed)
Location:  [facility] Room Number: [phone] Place of Service:  SNF [phone])  [name], MD  Patient Care Team: Hennie Duos, MD as PCP - General (Internal Medicine)  Extended Emergency Contact Information Primary Emergency Contact: Krienke,[name] Address: [address]          [place], [place] [address] [name] of [place] Phone: [phone] Mobile Phone: [phone] Relation: Son Secondary Emergency Contact: [place] of [place] Phone: [phone] Relation: Daughter    Allergies: Ambien [zolpidem tartrate]; Codeine phosphate; Darvocet [propoxyphene n-acetaminophen]; Fish-derived products; Promethazine hcl; Sulfamethoxazole; Vicodin [hydrocodone-acetaminophen]; Amoxicillin; and Penicillins  Chief Complaint  Patient presents with  . Medical Management of Chronic Issues    Routine Visit  . Health Maintenance    Influenza vacc    HPI: Patient is 84 y.o. female who is being seen for routine issues of PMR, RLS, and insomnia.  Past Medical History:  Diagnosis Date  . Anemia 03/02/2012  . Angina pectoris, unstable ([place]) 03/02/2012  . Anxiety   . Axillary abscess   . Bradycardia 06/13/2012  . CAD (coronary artery disease)    S/p PTCA / stenting (last cath 2004, multiple LAD stents, 2 stents in the right coronary artery all patent)  . Cancer ([place])   . Complication of anesthesia   . Compression fracture of L2 ([place]) 03/12/2017  . Dementia ([place])   . Dysphagia 10/06/2012  . Dyspnea 02/03/2012  . GERD (gastroesophageal reflux disease)   . History of pulmonary embolus (PE) 03/19/2017  . HTN (hypertension)   . PMR (polymyalgia rheumatica) (HCC)   . PONV (postoperative nausea and vomiting)   . Restless leg syndrome   . 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 Started coumadin  . Weakness of both legs 06/09/2012    Past Surgical History:  Procedure Laterality Date  . BREAST LUMPECTOMY    .  COLECTOMY    . ESOPHAGOGASTRODUODENOSCOPY (EGD) WITH ESOPHAGEAL DILATION  10/10/2012   Procedure: ESOPHAGOGASTRODUODENOSCOPY (EGD) WITH ESOPHAGEAL DILATION;  Surgeon: Inda Castle, MD;  Location: [facility];  Service: Endoscopy;  Laterality: N/A;  . heart stents  x 8  . INCISION AND DRAINAGE     bilateral axillary, non specific staff  . INCISION AND DRAINAGE ABSCESS  09/28/2012   Procedure: INCISION AND DRAINAGE ABSCESS;  Surgeon: Zenovia Jarred, MD;  Location: [facility];  Service: General;  Laterality: Bilateral;  . stent     cardiac x 8 stents.    Allergies as of 08/15/2018      Reactions   Ambien [zolpidem Tartrate] Nausea And Vomiting   Codeine Phosphate Nausea And Vomiting   Darvocet [propoxyphene N-acetaminophen] Nausea And Vomiting   Fish-derived Products    Promethazine Hcl Nausea And Vomiting   Sulfamethoxazole Other (See Comments)   Pt doesn't remember reaction   Vicodin [hydrocodone-acetaminophen] Other (See Comments)   Unknown reaction   Amoxicillin Nausea And Vomiting, Rash   Penicillins Nausea And Vomiting, Rash      Medication List        Accurate as of 08/15/18 11:59 PM. Always use your most recent med list.          ALPRAZolam 0.5 MG tablet Commonly known as:  XANAX Take 0.5 mg by mouth daily.   amLODipine 10 MG tablet Commonly known as:  NORVASC Take 10 mg by mouth every morning.   aspirin EC 81 MG tablet Take 1 tablet (81 mg total) by  mouth daily.   atorvastatin 10 MG tablet Commonly known as:  LIPITOR Take 10 mg by mouth every evening.   cholecalciferol 1000 units tablet Commonly known as:  VITAMIN D Take 2,000 Units by mouth daily with lunch. 2 tabs   donepezil 10 MG tablet Commonly known as:  ARICEPT Take 10 mg by mouth every evening.   fluticasone 50 MCG/ACT nasal spray Commonly known as:  FLONASE Place 1 spray into both nostrils daily as needed for allergies or rhinitis.   hydrALAZINE 25 MG tablet Commonly known as:   APRESOLINE Take 1 tablet (25 mg total) by mouth every 8 (eight) hours.   losartan 50 MG tablet Commonly known as:  COZAAR Take 50 mg by mouth at bedtime.   mirtazapine 15 MG tablet Commonly known as:  REMERON Take 7.5 mg by mouth at bedtime.   nitroGLYCERIN 0.4 MG SL tablet Commonly known as:  NITROSTAT Place 0.4 mg under the tongue every 5 (five) minutes as needed for chest pain.   ondansetron 4 MG tablet Commonly known as:  ZOFRAN Take 4 mg by mouth every 4 (four) hours as needed for nausea.   oxybutynin 5 MG tablet Commonly known as:  DITROPAN Take 5 mg by mouth 2 (two) times daily.   polyethylene glycol packet Commonly known as:  MIRALAX / GLYCOLAX Take 17 g by mouth daily as needed for moderate constipation.   potassium chloride SA 20 MEQ tablet Commonly known as:  K-DUR,KLOR-CON Take 20 mEq by mouth daily with lunch.   predniSONE 1 MG tablet Commonly known as:  DELTASONE Take 1 mg by mouth daily with breakfast.   rOPINIRole 1 MG tablet Commonly known as:  REQUIP Take 1 mg by mouth. 1mg  at noon, 1mg  2 hours before bed, then 1mg  at bedtime   sertraline 25 MG tablet Commonly known as:  ZOLOFT Take 25 mg by mouth every morning.   SILENOR 6 MG Tabs Generic drug:  Doxepin HCl Take 6 mg by mouth at bedtime.   traMADol 50 MG tablet Commonly known as:  ULTRAM Take 50 mg by mouth at bedtime.   TUMS 500 MG chewable tablet Generic drug:  calcium carbonate Chew 1 tablet by mouth 3 (three) times daily as needed for indigestion or heartburn.   warfarin 2 MG tablet Commonly known as:  COUMADIN Take 2 mg by mouth daily. TAKE 1 TAB PO ON TUE, THURS, SAT SUN   warfarin 2.5 MG tablet Commonly known as:  COUMADIN Take 2.5 mg by mouth daily. TAKE 1 TAB PO ON M,W F       No orders of the defined types were placed in this encounter.   Immunization History  Administered Date(s) Administered  . Influenza-Unspecified 08/01/2016, 08/11/2017  . PPD Test 03/18/2017  .  Pneumococcal Conjugate-13 09/17/2017    Social History   Tobacco Use  . Smoking status: Never Smoker  . Smokeless tobacco: Never Used  Substance Use Topics  . Alcohol use: No    Review of Systems  DATA OBTAINED: from patient, nurse GENERAL:  no fevers, fatigue, appetite changes SKIN: No itching, rash HEENT: No complaint RESPIRATORY: No cough, wheezing, SOB CARDIAC: No chest pain, palpitations, lower extremity edema  GI: No abdominal pain, No N/V/D or constipation, No heartburn or reflux  GU: No dysuria, frequency or urgency, or incontinence  MUSCULOSKELETAL: No unrelieved bone/joint pain NEUROLOGIC: No headache, dizziness  PSYCHIATRIC: No overt anxiety or sadness  Vitals:   08/15/18 1259  BP: 118/67  Pulse: 67  Resp: 18  Temp: 98 F (36.7 C)   Body mass index is 24 kg/m. Physical Exam  GENERAL APPEARANCE: Alert, conversant, No acute distress  SKIN: No diaphoresis rash HEENT: Unremarkable RESPIRATORY: Breathing is even, unlabored. Lung sounds are clear   CARDIOVASCULAR: Heart RRR no murmurs, rubs or gallops. No peripheral edema  GASTROINTESTINAL: Abdomen is soft, non-tender, not distended w/ normal bowel sounds.  GENITOURINARY: Bladder non tender, not distended  MUSCULOSKELETAL: No abnormal joints or musculature NEUROLOGIC: Cranial nerves 2-12 grossly intact. Moves all extremities PSYCHIATRIC: Mood and affect with dementia, no behavioral issues  Patient Active Problem List   Diagnosis Date Noted  . Overactive bladder 09/24/2017  . Insomnia 07/31/2017  . Anxiety 06/16/2017  . Citrobacter infection 03/19/2017  . AKI (acute kidney injury) ([place]) 03/19/2017  . History of pulmonary embolus (PE) 03/19/2017  . Elevated INR 03/19/2017  . Candidal intertrigo 03/19/2017  . Compression fracture of L2 ([place]) 03/12/2017  . Acute lower UTI 03/12/2017  . Accelerated hypertension 03/12/2017  . Leukocytosis 07/31/2015  . Acute bronchitis 07/28/2015  . Chronic  anticoagulation 07/28/2015  . Intractable back pain 07/20/2015  . Back pain 07/20/2015  . Cellulitis and abscess of left buttock 04/12/2013  . Stricture and stenosis of esophagus 10/10/2012  . Dementia ([place]) 10/06/2012  . Dysphagia 10/06/2012  . Axillary abscess - bilateral, multiple 09/27/2012  . Hypotension 08/24/2012  . Near syncope 08/24/2012  . Abscess of axilla, left 06/27/2012  . Bradycardia 06/13/2012  . Abscess 06/11/2012  . Chest pain 06/09/2012  . Cellulitis 06/09/2012  . Weakness of both legs 06/09/2012  . PMR (polymyalgia rheumatica) (HCC)   . Restless leg syndrome   . Angina pectoris, unstable ([place]) 03/02/2012  . Anemia 03/02/2012  . Dyspnea 02/03/2012  . UTI (urinary tract infection) 02/03/2012  . CAD (coronary artery disease)   . HTN (hypertension)   . VTE (venous thromboembolism) 10/01/2010  . HYPERCHOLESTEROLEMIA  IIA 08/27/2009  . Hyperlipidemia 12/11/2008  . HYPERTENSION, BENIGN 12/11/2008    CMP     Component Value Date/Time   NA 141 08/01/2018   K 4.6 08/01/2018   CL 111 03/16/2017 0400   CO2 21 (L) 03/16/2017 0400   GLUCOSE 119 (H) 03/16/2017 0400   BUN 19 08/01/2018   CREATININE 1.3 (A) 08/01/2018   CREATININE 1.03 (H) 03/16/2017 0400   CALCIUM 8.7 (L) 03/16/2017 0400   PROT 6.7 03/11/2017 2031   ALBUMIN 3.4 (L) 03/11/2017 2031   AST 9 (A) 08/01/2018   ALT 7 08/01/2018   ALKPHOS 58 08/01/2018   BILITOT 0.5 03/11/2017 2031   GFRNONAA 45 (L) 03/16/2017 0400   GFRAA 53 (L) 03/16/2017 0400   Recent Labs    09/27/17 08/01/18  NA 139 141  K 4.5 4.6  BUN 24* 19  CREATININE 1.0 1.3*   Recent Labs    09/27/17 08/01/18  AST 13 9*  ALT 9 7  ALKPHOS 59 58   Recent Labs    09/27/17 08/01/18  WBC 6.7 6.3  HGB 10.0* 9.5*  HCT 32* 30*  PLT 250 246   Recent Labs    09/27/17 08/01/18  CHOL 129 140  LDLCALC 71 80  TRIG 111 82   No results found for: [facility] Lab Results  Component Value Date   TSH 1.71 08/01/2018   Lab Results    Component Value Date   HGBA1C 5.7 08/01/2018   Lab Results  Component Value Date   CHOL 140 08/01/2018   HDL 46 08/01/2018  LDLCALC 80 08/01/2018   TRIG 82 08/01/2018   CHOLHDL 2.4 03/03/2012    Significant Diagnostic Results in last 30 days:  No results found.  Assessment and Plan  PMR (polymyalgia rheumatica) No complaints of pain; well-controlled on prednisone 1 mg daily  Restless leg syndrome No complaints; continue Requip 1 mg 3 times daily  Insomnia No complaints; continue doxepin 6 mg nightly    [name] Carthen D. [name], MD

## 2018-09-02 ENCOUNTER — Encounter: Payer: Self-pay | Admitting: Internal Medicine

## 2018-09-02 NOTE — Assessment & Plan Note (Signed)
No complaints of pain; well-controlled on prednisone 1 mg daily

## 2018-09-02 NOTE — Assessment & Plan Note (Signed)
No complaints; continue doxepin 6 mg nightly

## 2018-09-02 NOTE — Assessment & Plan Note (Signed)
No complaints; continue Requip 1 mg 3 times daily

## 2018-09-06 DIAGNOSIS — Z9181 History of falling: Secondary | ICD-10-CM | POA: Diagnosis not present

## 2018-09-06 DIAGNOSIS — I82409 Acute embolism and thrombosis of unspecified deep veins of unspecified lower extremity: Secondary | ICD-10-CM | POA: Diagnosis not present

## 2018-09-06 DIAGNOSIS — M6281 Muscle weakness (generalized): Secondary | ICD-10-CM | POA: Diagnosis not present

## 2018-09-07 DIAGNOSIS — Z9181 History of falling: Secondary | ICD-10-CM | POA: Diagnosis not present

## 2018-09-07 DIAGNOSIS — M6281 Muscle weakness (generalized): Secondary | ICD-10-CM | POA: Diagnosis not present

## 2018-09-07 DIAGNOSIS — I82409 Acute embolism and thrombosis of unspecified deep veins of unspecified lower extremity: Secondary | ICD-10-CM | POA: Diagnosis not present

## 2018-09-07 DIAGNOSIS — I2699 Other pulmonary embolism without acute cor pulmonale: Secondary | ICD-10-CM | POA: Diagnosis not present

## 2018-09-07 DIAGNOSIS — I4891 Unspecified atrial fibrillation: Secondary | ICD-10-CM | POA: Diagnosis not present

## 2018-09-08 DIAGNOSIS — F419 Anxiety disorder, unspecified: Secondary | ICD-10-CM | POA: Diagnosis not present

## 2018-09-08 DIAGNOSIS — F329 Major depressive disorder, single episode, unspecified: Secondary | ICD-10-CM | POA: Diagnosis not present

## 2018-09-08 DIAGNOSIS — M6281 Muscle weakness (generalized): Secondary | ICD-10-CM | POA: Diagnosis not present

## 2018-09-08 DIAGNOSIS — Z9181 History of falling: Secondary | ICD-10-CM | POA: Diagnosis not present

## 2018-09-08 DIAGNOSIS — I82409 Acute embolism and thrombosis of unspecified deep veins of unspecified lower extremity: Secondary | ICD-10-CM | POA: Diagnosis not present

## 2018-09-08 DIAGNOSIS — F0390 Unspecified dementia without behavioral disturbance: Secondary | ICD-10-CM | POA: Diagnosis not present

## 2018-09-09 DIAGNOSIS — I82409 Acute embolism and thrombosis of unspecified deep veins of unspecified lower extremity: Secondary | ICD-10-CM | POA: Diagnosis not present

## 2018-09-09 DIAGNOSIS — Z9181 History of falling: Secondary | ICD-10-CM | POA: Diagnosis not present

## 2018-09-09 DIAGNOSIS — M6281 Muscle weakness (generalized): Secondary | ICD-10-CM | POA: Diagnosis not present

## 2018-09-11 DIAGNOSIS — M6281 Muscle weakness (generalized): Secondary | ICD-10-CM | POA: Diagnosis not present

## 2018-09-11 DIAGNOSIS — Z9181 History of falling: Secondary | ICD-10-CM | POA: Diagnosis not present

## 2018-09-11 DIAGNOSIS — I82409 Acute embolism and thrombosis of unspecified deep veins of unspecified lower extremity: Secondary | ICD-10-CM | POA: Diagnosis not present

## 2018-09-14 DIAGNOSIS — M6281 Muscle weakness (generalized): Secondary | ICD-10-CM | POA: Diagnosis not present

## 2018-09-14 DIAGNOSIS — Z9181 History of falling: Secondary | ICD-10-CM | POA: Diagnosis not present

## 2018-09-14 DIAGNOSIS — I82409 Acute embolism and thrombosis of unspecified deep veins of unspecified lower extremity: Secondary | ICD-10-CM | POA: Diagnosis not present

## 2018-09-18 ENCOUNTER — Encounter: Payer: Self-pay | Admitting: Internal Medicine

## 2018-09-18 ENCOUNTER — Non-Acute Institutional Stay (SKILLED_NURSING_FACILITY): Payer: Medicare Other | Admitting: Internal Medicine

## 2018-09-18 DIAGNOSIS — E785 Hyperlipidemia, unspecified: Secondary | ICD-10-CM

## 2018-09-18 DIAGNOSIS — Z86711 Personal history of pulmonary embolism: Secondary | ICD-10-CM | POA: Diagnosis not present

## 2018-09-18 DIAGNOSIS — M6281 Muscle weakness (generalized): Secondary | ICD-10-CM | POA: Diagnosis not present

## 2018-09-18 DIAGNOSIS — G301 Alzheimer's disease with late onset: Secondary | ICD-10-CM | POA: Diagnosis not present

## 2018-09-18 DIAGNOSIS — I82409 Acute embolism and thrombosis of unspecified deep veins of unspecified lower extremity: Secondary | ICD-10-CM | POA: Diagnosis not present

## 2018-09-18 DIAGNOSIS — F0280 Dementia in other diseases classified elsewhere without behavioral disturbance: Secondary | ICD-10-CM | POA: Diagnosis not present

## 2018-09-18 DIAGNOSIS — Z9181 History of falling: Secondary | ICD-10-CM | POA: Diagnosis not present

## 2018-09-18 NOTE — Progress Notes (Signed)
Location:  (P) [facility] of Service:     [name], MD  Patient Care Team: Hennie Duos, MD as PCP - General (Internal Medicine)  Extended Emergency Contact Information Primary Emergency Contact: Milke,[name] Address: [address]          [place], [place] [address] [name] of [place] Phone: [phone] Mobile Phone: [phone] Relation: Son Secondary Emergency Contact: [place] of [place] Phone: [phone] Relation: Daughter    Allergies: Ambien [zolpidem tartrate]; Codeine phosphate; Darvocet [propoxyphene n-acetaminophen]; Fish-derived products; Promethazine hcl; Sulfamethoxazole; Vicodin [hydrocodone-acetaminophen]; Amoxicillin; and Penicillins  Chief Complaint  Patient presents with  . Medical Management of Chronic Issues    Routine Visit    HPI: Patient is 84 y.o. female who is being seen for routine issues of PE, dementia, and hyperlipidemia.  Past Medical History:  Diagnosis Date  . Anemia 03/02/2012  . Angina pectoris, unstable ([place]) 03/02/2012  . Anxiety   . Axillary abscess   . Bradycardia 06/13/2012  . CAD (coronary artery disease)    S/p PTCA / stenting (last cath 2004, multiple LAD stents, 2 stents in the right coronary artery all patent)  . Cancer ([place])   . Complication of anesthesia   . Compression fracture of L2 ([place]) 03/12/2017  . Dementia ([place])   . Dysphagia 10/06/2012  . Dyspnea 02/03/2012  . GERD (gastroesophageal reflux disease)   . History of pulmonary embolus (PE) 03/19/2017  . HTN (hypertension)   . PMR (polymyalgia rheumatica) (HCC)   . PONV (postoperative nausea and vomiting)   . Restless leg syndrome   . 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 Started coumadin  . Weakness of both legs 06/09/2012    Past Surgical History:  Procedure Laterality Date  . BREAST LUMPECTOMY    . COLECTOMY    . ESOPHAGOGASTRODUODENOSCOPY (EGD) WITH ESOPHAGEAL  DILATION  10/10/2012   Procedure: ESOPHAGOGASTRODUODENOSCOPY (EGD) WITH ESOPHAGEAL DILATION;  Surgeon: Inda Castle, MD;  Location: [facility];  Service: Endoscopy;  Laterality: N/A;  . heart stents  x 8  . INCISION AND DRAINAGE     bilateral axillary, non specific staff  . INCISION AND DRAINAGE ABSCESS  09/28/2012   Procedure: INCISION AND DRAINAGE ABSCESS;  Surgeon: Zenovia Jarred, MD;  Location: [facility];  Service: General;  Laterality: Bilateral;  . stent     cardiac x 8 stents.    Allergies as of 09/18/2018      Reactions   Ambien [zolpidem Tartrate] Nausea And Vomiting   Codeine Phosphate Nausea And Vomiting   Darvocet [propoxyphene N-acetaminophen] Nausea And Vomiting   Fish-derived Products    Promethazine Hcl Nausea And Vomiting   Sulfamethoxazole Other (See Comments)   Pt doesn't remember reaction   Vicodin [hydrocodone-acetaminophen] Other (See Comments)   Unknown reaction   Amoxicillin Nausea And Vomiting, Rash   Penicillins Nausea And Vomiting, Rash      Medication List        Accurate as of 09/18/18 11:59 PM. Always use your most recent med list.          ALPRAZolam 0.5 MG tablet Commonly known as:  XANAX Take 0.5 mg by mouth daily.   amLODipine 10 MG tablet Commonly known as:  NORVASC Take 10 mg by mouth every morning.   aspirin EC 81 MG tablet Take 1 tablet (81 mg total) by mouth daily.   atorvastatin 10 MG tablet Commonly known as:  LIPITOR Take 10 mg by mouth every evening.   cholecalciferol 1000 units tablet Commonly known as:  VITAMIN D Take 2,000 Units by mouth daily with lunch. 2 tabs   donepezil 10 MG tablet Commonly known as:  ARICEPT Take 10 mg by mouth every evening.   fluticasone 50 MCG/ACT nasal spray Commonly known as:  FLONASE Place 1 spray into both nostrils daily as needed for allergies or rhinitis.   hydrALAZINE 25 MG tablet Commonly known as:  APRESOLINE Take 1 tablet (25 mg total) by mouth every 8 (eight) hours.     losartan 50 MG tablet Commonly known as:  COZAAR Take 50 mg by mouth at bedtime.   mirtazapine 15 MG tablet Commonly known as:  REMERON Take 7.5 mg by mouth at bedtime.   nitroGLYCERIN 0.4 MG SL tablet Commonly known as:  NITROSTAT Place 0.4 mg under the tongue every 5 (five) minutes as needed for chest pain.   ondansetron 4 MG tablet Commonly known as:  ZOFRAN Take 4 mg by mouth every 4 (four) hours as needed for nausea.   oxybutynin 5 MG tablet Commonly known as:  DITROPAN Take 5 mg by mouth 2 (two) times daily.   polyethylene glycol packet Commonly known as:  MIRALAX / GLYCOLAX Take 17 g by mouth daily as needed for moderate constipation.   potassium chloride SA 20 MEQ tablet Commonly known as:  K-DUR,KLOR-CON Take 20 mEq by mouth daily with lunch.   predniSONE 1 MG tablet Commonly known as:  DELTASONE Take 1 mg by mouth daily with breakfast.   PROAIR HFA 108 (90 Base) MCG/ACT inhaler Generic drug:  albuterol Inhale 2 puffs into the lungs every 6 (six) hours as needed for wheezing or shortness of breath.   rOPINIRole 1 MG tablet Commonly known as:  REQUIP Take 1 mg by mouth. 1mg  at noon, 1mg  2 hours before bed, then 1mg  at bedtime   sertraline 25 MG tablet Commonly known as:  ZOLOFT Take 25 mg by mouth every morning.   SILENOR 6 MG Tabs Generic drug:  Doxepin HCl Take 6 mg by mouth at bedtime.   traMADol 50 MG tablet Commonly known as:  ULTRAM Take 50 mg by mouth at bedtime.   TUMS 500 MG chewable tablet Generic drug:  calcium carbonate Chew 1 tablet by mouth 3 (three) times daily as needed for indigestion or heartburn.   warfarin 3 MG tablet Commonly known as:  COUMADIN Take 3 mg by mouth daily.       No orders of the defined types were placed in this encounter.   Immunization History  Administered Date(s) Administered  . Influenza-Unspecified 08/01/2016, 08/11/2017  . PPD Test 03/18/2017  . Pneumococcal Conjugate-13 09/17/2017    Social  History   Tobacco Use  . Smoking status: Never Smoker  . Smokeless tobacco: Never Used  Substance Use Topics  . Alcohol use: No    Review of Systems  DATA OBTAINED: from patient, nurse GENERAL:  no fevers, fatigue, appetite changes SKIN: No itching, rash HEENT: No complaint RESPIRATORY: No cough, wheezing, SOB CARDIAC: No chest pain, palpitations, lower extremity edema  GI: No abdominal pain, No N/V/D or constipation, No heartburn or reflux  GU: No dysuria, frequency or urgency, or incontinence  MUSCULOSKELETAL: No unrelieved bone/joint pain NEUROLOGIC: No headache, dizziness  PSYCHIATRIC: No overt anxiety or sadness  Vitals:   09/18/18 1303  BP: 128/68  Pulse: 69  Resp: 18  Temp: 97.8 F (36.6 C)   Body mass index  is 23.85 kg/m. Physical Exam  GENERAL APPEARANCE: Alert, conversant, No acute distress  SKIN: No diaphoresis rash HEENT: Unremarkable RESPIRATORY: Breathing is even, unlabored. Lung sounds are clear   CARDIOVASCULAR: Heart RRR 2/6 murmur, no rubs or gallops. No peripheral edema  GASTROINTESTINAL: Abdomen is soft, non-tender, not distended w/ normal bowel sounds.  GENITOURINARY: Bladder non tender, not distended  MUSCULOSKELETAL: No abnormal joints or musculature NEUROLOGIC: Cranial nerves 2-12 grossly intact. Moves all extremities PSYCHIATRIC: Mood and affect dementia, no behavioral issues  Patient Active Problem List   Diagnosis Date Noted  . Overactive bladder 09/24/2017  . Insomnia 07/31/2017  . Anxiety 06/16/2017  . Citrobacter infection 03/19/2017  . AKI (acute kidney injury) ([facility]) 03/19/2017  . History of pulmonary embolus (PE) 03/19/2017  . Elevated INR 03/19/2017  . Candidal intertrigo 03/19/2017  . Compression fracture of L2 ([facility]) 03/12/2017  . Acute lower UTI 03/12/2017  . Accelerated hypertension 03/12/2017  . Leukocytosis 07/31/2015  . Acute bronchitis 07/28/2015  . Chronic anticoagulation 07/28/2015  . Intractable back pain  07/20/2015  . Back pain 07/20/2015  . Cellulitis and abscess of left buttock 04/12/2013  . Stricture and stenosis of esophagus 10/10/2012  . Dementia ([place]) 10/06/2012  . Dysphagia 10/06/2012  . Axillary abscess - bilateral, multiple 09/27/2012  . Hypotension 08/24/2012  . Near syncope 08/24/2012  . Abscess of axilla, left 06/27/2012  . Bradycardia 06/13/2012  . Abscess 06/11/2012  . Chest pain 06/09/2012  . Cellulitis 06/09/2012  . Weakness of both legs 06/09/2012  . PMR (polymyalgia rheumatica) (HCC)   . Restless leg syndrome   . Angina pectoris, unstable ([place]) 03/02/2012  . Anemia 03/02/2012  . Dyspnea 02/03/2012  . UTI (urinary tract infection) 02/03/2012  . CAD (coronary artery disease)   . HTN (hypertension)   . VTE (venous thromboembolism) 10/01/2010  . HYPERCHOLESTEROLEMIA  IIA 08/27/2009  . Hyperlipidemia 12/11/2008  . HYPERTENSION, BENIGN 12/11/2008    CMP     Component Value Date/Time   NA 141 08/01/2018   K 4.6 08/01/2018   CL 111 03/16/2017 0400   CO2 21 (L) 03/16/2017 0400   GLUCOSE 119 (H) 03/16/2017 0400   BUN 19 08/01/2018   CREATININE 1.3 (A) 08/01/2018   CREATININE 1.03 (H) 03/16/2017 0400   CALCIUM 8.7 (L) 03/16/2017 0400   PROT 6.7 03/11/2017 2031   ALBUMIN 3.4 (L) 03/11/2017 2031   AST 9 (A) 08/01/2018   ALT 7 08/01/2018   ALKPHOS 58 08/01/2018   BILITOT 0.5 03/11/2017 2031   GFRNONAA 45 (L) 03/16/2017 0400   GFRAA 53 (L) 03/16/2017 0400   Recent Labs    09/27/17 08/01/18  NA 139 141  K 4.5 4.6  BUN 24* 19  CREATININE 1.0 1.3*   Recent Labs    09/27/17 08/01/18  AST 13 9*  ALT 9 7  ALKPHOS 59 58   Recent Labs    09/27/17 08/01/18  WBC 6.7 6.3  HGB 10.0* 9.5*  HCT 32* 30*  PLT 250 246   Recent Labs    09/27/17 08/01/18  CHOL 129 140  LDLCALC 71 80  TRIG 111 82   No results found for: MICROALBUR Lab Results  Component Value Date   TSH 1.71 08/01/2018   Lab Results  Component Value Date   HGBA1C 5.7 08/01/2018    Lab Results  Component Value Date   CHOL 140 08/01/2018   HDL 46 08/01/2018   LDLCALC 80 08/01/2018   TRIG 82 08/01/2018   CHOLHDL 2.4 03/03/2012  Significant Diagnostic Results in last 30 days:  No results found.  Assessment and Plan  History of pulmonary embolus (PE) No problems reported; patient on chronic Coumadin which is actively titrated  Dementia Stable, chronic; continue Aricept 10 mg daily  Hyperlipidemia LDL 80, HDL 46, very can control for patient of this age; continue Lipitor 10 mg daily although it may be reasonable to stop tour some point     Winslow D. [name], MD

## 2018-09-19 DIAGNOSIS — M6281 Muscle weakness (generalized): Secondary | ICD-10-CM | POA: Diagnosis not present

## 2018-09-19 DIAGNOSIS — I82409 Acute embolism and thrombosis of unspecified deep veins of unspecified lower extremity: Secondary | ICD-10-CM | POA: Diagnosis not present

## 2018-09-19 DIAGNOSIS — Z9181 History of falling: Secondary | ICD-10-CM | POA: Diagnosis not present

## 2018-09-20 DIAGNOSIS — M6281 Muscle weakness (generalized): Secondary | ICD-10-CM | POA: Diagnosis not present

## 2018-09-20 DIAGNOSIS — Z9181 History of falling: Secondary | ICD-10-CM | POA: Diagnosis not present

## 2018-09-20 DIAGNOSIS — I82409 Acute embolism and thrombosis of unspecified deep veins of unspecified lower extremity: Secondary | ICD-10-CM | POA: Diagnosis not present

## 2018-09-21 DIAGNOSIS — Z9181 History of falling: Secondary | ICD-10-CM | POA: Diagnosis not present

## 2018-09-21 DIAGNOSIS — I82409 Acute embolism and thrombosis of unspecified deep veins of unspecified lower extremity: Secondary | ICD-10-CM | POA: Diagnosis not present

## 2018-09-21 DIAGNOSIS — M6281 Muscle weakness (generalized): Secondary | ICD-10-CM | POA: Diagnosis not present

## 2018-09-24 ENCOUNTER — Encounter: Payer: Self-pay | Admitting: Internal Medicine

## 2018-09-24 NOTE — Assessment & Plan Note (Signed)
Stable, chronic; continue Aricept 10 mg daily

## 2018-09-24 NOTE — Assessment & Plan Note (Signed)
No problems reported; patient on chronic Coumadin which is actively titrated

## 2018-09-24 NOTE — Assessment & Plan Note (Signed)
LDL 80, HDL 46, very can control for patient of this age; continue Lipitor 10 mg daily although it may be reasonable to stop tour some point

## 2018-09-25 DIAGNOSIS — Z9181 History of falling: Secondary | ICD-10-CM | POA: Diagnosis not present

## 2018-09-25 DIAGNOSIS — M6281 Muscle weakness (generalized): Secondary | ICD-10-CM | POA: Diagnosis not present

## 2018-09-25 DIAGNOSIS — I82409 Acute embolism and thrombosis of unspecified deep veins of unspecified lower extremity: Secondary | ICD-10-CM | POA: Diagnosis not present

## 2018-09-27 DIAGNOSIS — Z9181 History of falling: Secondary | ICD-10-CM | POA: Diagnosis not present

## 2018-09-27 DIAGNOSIS — M6281 Muscle weakness (generalized): Secondary | ICD-10-CM | POA: Diagnosis not present

## 2018-09-27 DIAGNOSIS — I82409 Acute embolism and thrombosis of unspecified deep veins of unspecified lower extremity: Secondary | ICD-10-CM | POA: Diagnosis not present

## 2018-09-28 DIAGNOSIS — Z9181 History of falling: Secondary | ICD-10-CM | POA: Diagnosis not present

## 2018-09-28 DIAGNOSIS — I82409 Acute embolism and thrombosis of unspecified deep veins of unspecified lower extremity: Secondary | ICD-10-CM | POA: Diagnosis not present

## 2018-09-28 DIAGNOSIS — M6281 Muscle weakness (generalized): Secondary | ICD-10-CM | POA: Diagnosis not present

## 2018-09-29 DIAGNOSIS — Z9181 History of falling: Secondary | ICD-10-CM | POA: Diagnosis not present

## 2018-09-29 DIAGNOSIS — M6281 Muscle weakness (generalized): Secondary | ICD-10-CM | POA: Diagnosis not present

## 2018-09-29 DIAGNOSIS — I82409 Acute embolism and thrombosis of unspecified deep veins of unspecified lower extremity: Secondary | ICD-10-CM | POA: Diagnosis not present

## 2018-10-02 DIAGNOSIS — Z9181 History of falling: Secondary | ICD-10-CM | POA: Diagnosis not present

## 2018-10-02 DIAGNOSIS — I82409 Acute embolism and thrombosis of unspecified deep veins of unspecified lower extremity: Secondary | ICD-10-CM | POA: Diagnosis not present

## 2018-10-02 DIAGNOSIS — M6281 Muscle weakness (generalized): Secondary | ICD-10-CM | POA: Diagnosis not present

## 2018-10-13 ENCOUNTER — Encounter: Payer: Self-pay | Admitting: Internal Medicine

## 2018-10-13 ENCOUNTER — Non-Acute Institutional Stay (SKILLED_NURSING_FACILITY): Payer: Medicare Other | Admitting: Internal Medicine

## 2018-10-13 DIAGNOSIS — F419 Anxiety disorder, unspecified: Secondary | ICD-10-CM | POA: Diagnosis not present

## 2018-10-13 DIAGNOSIS — Z7901 Long term (current) use of anticoagulants: Secondary | ICD-10-CM

## 2018-10-13 DIAGNOSIS — I10 Essential (primary) hypertension: Secondary | ICD-10-CM

## 2018-10-13 NOTE — Progress Notes (Signed)
Location:  [facility] Room Number: [id number] Place of Service:  SNF [phone])  [name] [name]. [name], MD  Patient Care Team: Hennie Duos, MD as PCP - General (Internal Medicine)  Extended Emergency Contact Information Primary Emergency Contact: Yazdani,[name] Address: [address]          [place], [place] [address] [name] of [place] Phone: [phone] Mobile Phone: [phone] Relation: Son Secondary Emergency Contact: [place] of [place] Phone: [phone] Relation: Daughter    Allergies: Ambien [zolpidem tartrate]; Codeine phosphate; Darvocet [propoxyphene n-acetaminophen]; Fish-derived products; Promethazine hcl; Sulfamethoxazole; Vicodin [hydrocodone-acetaminophen]; Amoxicillin; and Penicillins  Chief Complaint  Patient presents with  . Medical Management of Chronic Issues    Routine Visit    HPI: Patient is 84 y.o. female who is being seen for routine issues of hypertension, anxiety, and chronic anticoagulation.  Past Medical History:  Diagnosis Date  . Anemia 03/02/2012  . Angina pectoris, unstable ([facility]) 03/02/2012  . Anxiety   . Axillary abscess   . Bradycardia 06/13/2012  . CAD (coronary artery disease)    S/p PTCA / stenting (last cath 2004, multiple LAD stents, 2 stents in the right coronary artery all patent)  . Cancer ([facility])   . Complication of anesthesia   . Compression fracture of L2 ([facility]) 03/12/2017  . Dementia ([facility])   . Dysphagia 10/06/2012  . Dyspnea 02/03/2012  . GERD (gastroesophageal reflux disease)   . History of pulmonary embolus (PE) 03/19/2017  . HTN (hypertension)   . PMR (polymyalgia rheumatica) (HCC)   . PONV (postoperative nausea and vomiting)   . Restless leg syndrome   . 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 Started coumadin  . Weakness of both legs 06/09/2012    Past Surgical History:  Procedure Laterality Date  . BREAST LUMPECTOMY    . COLECTOMY    .  ESOPHAGOGASTRODUODENOSCOPY (EGD) WITH ESOPHAGEAL DILATION  10/10/2012   Procedure: ESOPHAGOGASTRODUODENOSCOPY (EGD) WITH ESOPHAGEAL DILATION;  Surgeon: Inda Castle, MD;  Location: [facility];  Service: Endoscopy;  Laterality: N/A;  . heart stents  x 8  . INCISION AND DRAINAGE     bilateral axillary, non specific staff  . INCISION AND DRAINAGE ABSCESS  09/28/2012   Procedure: INCISION AND DRAINAGE ABSCESS;  Surgeon: Zenovia Jarred, MD;  Location: [facility];  Service: General;  Laterality: Bilateral;  . stent     cardiac x 8 stents.    Allergies as of 10/13/2018      Reactions   Ambien [zolpidem Tartrate] Nausea And Vomiting   Codeine Phosphate Nausea And Vomiting   Darvocet [propoxyphene N-acetaminophen] Nausea And Vomiting   Fish-derived Products    Promethazine Hcl Nausea And Vomiting   Sulfamethoxazole Other (See Comments)   Pt doesn't remember reaction   Vicodin [hydrocodone-acetaminophen] Other (See Comments)   Unknown reaction   Amoxicillin Nausea And Vomiting, Rash   Penicillins Nausea And Vomiting, Rash      Medication List       Accurate as of October 13, 2018 11:59 PM. Always use your most recent med list.        ALPRAZolam 0.5 MG tablet Commonly known as:  XANAX Take 0.5 mg by mouth daily.   amLODipine 10 MG tablet Commonly known as:  NORVASC Take 10 mg by mouth every morning.   aspirin EC 81 MG tablet Take 1 tablet (81 mg total) by mouth daily.   atorvastatin 10 MG tablet Commonly  known as:  LIPITOR Take 10 mg by mouth every evening.   cholecalciferol 1000 units tablet Commonly known as:  VITAMIN D Take 2,000 Units by mouth daily with lunch. 2 tabs   donepezil 10 MG tablet Commonly known as:  ARICEPT Take 10 mg by mouth every evening.   fluticasone 50 MCG/ACT nasal spray Commonly known as:  FLONASE Place 1 spray into both nostrils daily as needed for allergies or rhinitis.   hydrALAZINE 25 MG tablet Commonly known as:  APRESOLINE Take 1  tablet (25 mg total) by mouth every 8 (eight) hours.   losartan 50 MG tablet Commonly known as:  COZAAR Take 50 mg by mouth at bedtime.   mirtazapine 15 MG tablet Commonly known as:  REMERON Take 7.5 mg by mouth at bedtime.   nitroGLYCERIN 0.4 MG SL tablet Commonly known as:  NITROSTAT Place 0.4 mg under the tongue every 5 (five) minutes as needed for chest pain.   ondansetron 4 MG tablet Commonly known as:  ZOFRAN Take 4 mg by mouth every 4 (four) hours as needed for nausea.   oxybutynin 5 MG tablet Commonly known as:  DITROPAN Take 5 mg by mouth 2 (two) times daily.   polyethylene glycol packet Commonly known as:  MIRALAX / GLYCOLAX Take 17 g by mouth daily as needed for moderate constipation.   potassium chloride SA 20 MEQ tablet Commonly known as:  K-DUR,KLOR-CON Take 20 mEq by mouth daily with lunch.   predniSONE 1 MG tablet Commonly known as:  DELTASONE Take 1 mg by mouth daily with breakfast.   PROAIR HFA 108 (90 Base) MCG/ACT inhaler Generic drug:  albuterol Inhale 2 puffs into the lungs every 6 (six) hours as needed for wheezing or shortness of breath.   rOPINIRole 1 MG tablet Commonly known as:  REQUIP Take 1 mg by mouth. 1mg  at noon, 1mg  2 hours before bed, then 1mg  at bedtime   sertraline 25 MG tablet Commonly known as:  ZOLOFT Take 12.5 mg by mouth. take 1/2 tab (12.5 mg) po every am for depresson   SILENOR 6 MG Tabs Generic drug:  Doxepin HCl Take 6 mg by mouth at bedtime.   traMADol 50 MG tablet Commonly known as:  ULTRAM Take 50 mg by mouth at bedtime.   TUMS 500 MG chewable tablet Generic drug:  calcium carbonate Chew 1 tablet by mouth 3 (three) times daily as needed for indigestion or heartburn.   warfarin 2.5 MG tablet Commonly known as:  COUMADIN Take 2.5 mg by mouth daily.       No orders of the defined types were placed in this encounter.   Immunization History  Administered Date(s) Administered  . Influenza-Unspecified  08/01/2016, 08/11/2017  . PPD Test 03/18/2017  . Pneumococcal Conjugate-13 09/17/2017    Social History   Tobacco Use  . Smoking status: Never Smoker  . Smokeless tobacco: Never Used  Substance Use Topics  . Alcohol use: No    Review of Systems  DATA OBTAINED: from patient, nurse GENERAL:  no fevers, fatigue, appetite changes SKIN: No itching, rash HEENT: No complaint RESPIRATORY: No cough, wheezing, SOB CARDIAC: No chest pain, palpitations, lower extremity edema  GI: No abdominal pain, No N/V/D or constipation, No heartburn or reflux  GU: No dysuria, frequency or urgency, or incontinence  MUSCULOSKELETAL: No unrelieved bone/joint pain NEUROLOGIC: No headache, dizziness  PSYCHIATRIC: No overt anxiety or sadness  Vitals:   10/13/18 1513  BP: 120/70  Pulse: 78  Resp: 20  Temp: (!) 97 F (36.1 C)   Body mass index is 24.03 kg/m. Physical Exam  GENERAL APPEARANCE: Alert, conversant, No acute distress  SKIN: No diaphoresis rash HEENT: Unremarkable RESPIRATORY: Breathing is even, unlabored. Lung sounds are clear   CARDIOVASCULAR: Heart RRR 2/6 murmur, no rubsor gallops. No peripheral edema  GASTROINTESTINAL: Abdomen is soft, non-tender, not distended w/ normal bowel sounds.  GENITOURINARY: Bladder non tender, not distended  MUSCULOSKELETAL: No abnormal joints or musculature NEUROLOGIC: Cranial nerves 2-12 grossly intact. Moves all extremities PSYCHIATRIC: Mood and affect dementia, no behavioral issues  Patient Active Problem List   Diagnosis Date Noted  . Overactive bladder 09/24/2017  . Insomnia 07/31/2017  . Anxiety 06/16/2017  . Citrobacter infection 03/19/2017  . AKI (acute kidney injury) ([facility]) 03/19/2017  . History of pulmonary embolus (PE) 03/19/2017  . Elevated INR 03/19/2017  . Candidal intertrigo 03/19/2017  . Compression fracture of L2 ([facility]) 03/12/2017  . Acute lower UTI 03/12/2017  . Accelerated hypertension 03/12/2017  . Leukocytosis 07/31/2015    . Acute bronchitis 07/28/2015  . Chronic anticoagulation 07/28/2015  . Intractable back pain 07/20/2015  . Back pain 07/20/2015  . Cellulitis and abscess of left buttock 04/12/2013  . Stricture and stenosis of esophagus 10/10/2012  . Dementia ([facility]) 10/06/2012  . Dysphagia 10/06/2012  . Axillary abscess - bilateral, multiple 09/27/2012  . Hypotension 08/24/2012  . Near syncope 08/24/2012  . Abscess of axilla, left 06/27/2012  . Bradycardia 06/13/2012  . Abscess 06/11/2012  . Chest pain 06/09/2012  . Cellulitis 06/09/2012  . Weakness of both legs 06/09/2012  . PMR (polymyalgia rheumatica) (HCC)   . Restless leg syndrome   . Angina pectoris, unstable ([facility]) 03/02/2012  . Anemia 03/02/2012  . Dyspnea 02/03/2012  . UTI (urinary tract infection) 02/03/2012  . CAD (coronary artery disease)   . HTN (hypertension)   . VTE (venous thromboembolism) 10/01/2010  . HYPERCHOLESTEROLEMIA  IIA 08/27/2009  . Hyperlipidemia 12/11/2008  . HYPERTENSION, BENIGN 12/11/2008    CMP     Component Value Date/Time   NA 141 08/01/2018   K 4.6 08/01/2018   CL 111 03/16/2017 0400   CO2 21 (L) 03/16/2017 0400   GLUCOSE 119 (H) 03/16/2017 0400   BUN 19 08/01/2018   CREATININE 1.3 (A) 08/01/2018   CREATININE 1.03 (H) 03/16/2017 0400   CALCIUM 8.7 (L) 03/16/2017 0400   PROT 6.7 03/11/2017 2031   ALBUMIN 3.4 (L) 03/11/2017 2031   AST 9 (A) 08/01/2018   ALT 7 08/01/2018   ALKPHOS 58 08/01/2018   BILITOT 0.5 03/11/2017 2031   GFRNONAA 45 (L) 03/16/2017 0400   GFRAA 53 (L) 03/16/2017 0400   Recent Labs    08/01/18  NA 141  K 4.6  BUN 19  CREATININE 1.3*   Recent Labs    08/01/18  AST 9*  ALT 7  ALKPHOS 58   Recent Labs    08/01/18  WBC 6.3  HGB 9.5*  HCT 30*  PLT 246   Recent Labs    08/01/18  CHOL 140  LDLCALC 80  TRIG 82   No results found for: [facility] Lab Results  Component Value Date   TSH 1.71 08/01/2018   Lab Results  Component Value Date   HGBA1C 5.7  08/01/2018   Lab Results  Component Value Date   CHOL 140 08/01/2018   HDL 46 08/01/2018   LDLCALC 80 08/01/2018   TRIG 82 08/01/2018   CHOLHDL 2.4 03/03/2012    Significant Diagnostic Results in last  30 days:  No results found.  Assessment and Plan  HTN (hypertension) Controlled; continue current medications which are Norvasc 10 mg daily, hydralazine 25 mg every 8 hours and Cozaar 50 mg daily  Anxiety No problems; continue Xanax 0.5 mg daily  Chronic anticoagulation On warfarin for chronic PE; warfarin is actively titrated weekly    [name] [name]. [name], MD

## 2018-10-21 ENCOUNTER — Encounter: Payer: Self-pay | Admitting: Internal Medicine

## 2018-10-21 NOTE — Assessment & Plan Note (Signed)
No problems; continue Xanax 0.5 mg daily

## 2018-10-21 NOTE — Assessment & Plan Note (Signed)
On warfarin for chronic PE; warfarin is actively titrated weekly

## 2018-10-21 NOTE — Assessment & Plan Note (Signed)
Controlled; continue current medications which are Norvasc 10 mg daily, hydralazine 25 mg every 8 hours and Cozaar 50 mg daily

## 2018-10-23 ENCOUNTER — Other Ambulatory Visit: Payer: Self-pay

## 2018-10-23 MED ORDER — ALPRAZOLAM 0.5 MG PO TABS: 0.5000 mg | ORAL_TABLET | Freq: Every day | ORAL | 0 refills | Status: DC

## 2018-11-03 DIAGNOSIS — F329 Major depressive disorder, single episode, unspecified: Secondary | ICD-10-CM | POA: Diagnosis not present

## 2018-11-03 DIAGNOSIS — F0390 Unspecified dementia without behavioral disturbance: Secondary | ICD-10-CM | POA: Diagnosis not present

## 2018-11-03 DIAGNOSIS — F419 Anxiety disorder, unspecified: Secondary | ICD-10-CM | POA: Diagnosis not present

## 2018-11-14 ENCOUNTER — Encounter: Payer: Self-pay | Admitting: Internal Medicine

## 2018-11-14 NOTE — Progress Notes (Signed)
Location:  [facility] Room Number: [phone] Place of Service:  SNF [phone])  [name] [name]. [name] Coil, MD  Patient Care Team: Hennie Duos, MD as PCP - General (Internal Medicine)  Extended Emergency Contact Information Primary Emergency Contact: [name] [name] Address: [address]          [place], [place] [address] [name] [name] of [place] Phone: [phone] Mobile Phone: [phone] Relation: Son Secondary Emergency Contact: [name] [name] Phone: [phone] Relation: Daughter    Allergies: Ambien [zolpidem tartrate]; Codeine phosphate; Darvocet [propoxyphene n-acetaminophen]; Fish-derived products; Promethazine hcl; Sulfamethoxazole; Vicodin [hydrocodone-acetaminophen]; Amoxicillin; and Penicillins  Chief Complaint  Patient presents with  . Medical Management of Chronic Issues    Routine Visit    HPI: Patient is 85 y.o. female who is being seen for routine issues of hypertension, coronary artery disease and venous thromboembolism/PE.  Past Medical History:  Diagnosis Date  . Anemia 03/02/2012  . Angina pectoris, unstable ([place]) 03/02/2012  . Anxiety   . Axillary abscess   . Bradycardia 06/13/2012  . CAD (coronary artery disease)    S/p PTCA / stenting (last cath 2004, multiple LAD stents, 2 stents in the right coronary artery all patent)  . Cancer ([place])   . Complication of anesthesia   . Compression fracture of L2 ([facility]) 03/12/2017  . Dementia ([facility])   . Dysphagia 10/06/2012  . Dyspnea 02/03/2012  . GERD (gastroesophageal reflux disease)   . History of pulmonary embolus (PE) 03/19/2017  . HTN (hypertension)   . PMR (polymyalgia rheumatica) (HCC)   . PONV (postoperative nausea and vomiting)   . Restless leg syndrome   . 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 Started coumadin  . Weakness of both legs 06/09/2012    Past Surgical History:  Procedure Laterality Date  . BREAST LUMPECTOMY    .  COLECTOMY    . ESOPHAGOGASTRODUODENOSCOPY (EGD) WITH ESOPHAGEAL DILATION  10/10/2012   Procedure: ESOPHAGOGASTRODUODENOSCOPY (EGD) WITH ESOPHAGEAL DILATION;  Surgeon: Inda Castle, MD;  Location: [facility];  Service: Endoscopy;  Laterality: N/A;  . heart stents  x 8  . INCISION AND DRAINAGE     bilateral axillary, non specific staff  . INCISION AND DRAINAGE ABSCESS  09/28/2012   Procedure: INCISION AND DRAINAGE ABSCESS;  Surgeon: Zenovia Jarred, MD;  Location: [facility];  Service: General;  Laterality: Bilateral;  . stent     cardiac x 8 stents.    Allergies as of 11/15/2018      Reactions   Ambien [zolpidem Tartrate] Nausea And Vomiting   Codeine Phosphate Nausea And Vomiting   Darvocet [propoxyphene N-acetaminophen] Nausea And Vomiting   Fish-derived Products    Promethazine Hcl Nausea And Vomiting   Sulfamethoxazole Other (See Comments)   Pt doesn't remember reaction   Vicodin [hydrocodone-acetaminophen] Other (See Comments)   Unknown reaction   Amoxicillin Nausea And Vomiting, Rash   Penicillins Nausea And Vomiting, Rash      Medication List       Accurate as of November 15, 2018 11:59 PM. Always use your most recent med list.        ALPRAZolam 0.5 MG tablet Commonly known as:  XANAX Take 1 tablet (0.5 mg total) by mouth daily.   amLODipine 10 MG tablet Commonly known as:  NORVASC Take 10 mg by mouth every morning.   aspirin EC 81 MG tablet Take 1 tablet (81 mg total) by mouth daily.  atorvastatin 10 MG tablet Commonly known as:  LIPITOR Take 10 mg by mouth every evening.   cholecalciferol 1000 units tablet Commonly known as:  VITAMIN D Take 2,000 Units by mouth daily with lunch. 2 tabs   donepezil 10 MG tablet Commonly known as:  ARICEPT Take 10 mg by mouth every evening.   hydrALAZINE 25 MG tablet Commonly known as:  APRESOLINE Take 1 tablet (25 mg total) by mouth every 8 (eight) hours.   losartan 50 MG tablet Commonly known as:  COZAAR Take 50  mg by mouth at bedtime.   mirtazapine 15 MG tablet Commonly known as:  REMERON Take 7.5 mg by mouth at bedtime.   nitroGLYCERIN 0.4 MG SL tablet Commonly known as:  NITROSTAT Place 0.4 mg under the tongue every 5 (five) minutes as needed for chest pain.   ondansetron 4 MG tablet Commonly known as:  ZOFRAN Take 4 mg by mouth every 4 (four) hours as needed for nausea.   oxybutynin 5 MG tablet Commonly known as:  DITROPAN Take 5 mg by mouth 2 (two) times daily.   polyethylene glycol packet Commonly known as:  MIRALAX / GLYCOLAX Take 17 g by mouth daily as needed for moderate constipation.   potassium chloride SA 20 MEQ tablet Commonly known as:  K-DUR,KLOR-CON Take 20 mEq by mouth daily with lunch.   predniSONE 1 MG tablet Commonly known as:  DELTASONE Take 1 mg by mouth daily with breakfast.   PROAIR HFA 108 (90 Base) MCG/ACT inhaler Generic drug:  albuterol Inhale 2 puffs into the lungs every 6 (six) hours as needed for wheezing or shortness of breath.   rOPINIRole 1 MG tablet Commonly known as:  REQUIP Take 1 mg by mouth. 1mg  at noon, 1mg  2 hours before bed, then 1mg  at bedtime   sertraline 25 MG tablet Commonly known as:  ZOLOFT Take 12.5 mg by mouth. take 1/2 tab (12.5 mg) po every am for depresson   SILENOR 6 MG Tabs Generic drug:  Doxepin HCl Take 6 mg by mouth at bedtime.   traMADol 50 MG tablet Commonly known as:  ULTRAM Take 50 mg by mouth at bedtime.   TUMS 500 MG chewable tablet Generic drug:  calcium carbonate Chew 1 tablet by mouth 3 (three) times daily as needed for indigestion or heartburn.   warfarin 2.5 MG tablet Commonly known as:  COUMADIN Take 2.5 mg by mouth daily.       No orders of the defined types were placed in this encounter.   Immunization History  Administered Date(s) Administered  . Influenza-Unspecified 08/01/2016, 08/11/2017  . PPD Test 03/18/2017  . Pneumococcal Conjugate-13 09/17/2017    Social History   Tobacco  Use  . Smoking status: Never Smoker  . Smokeless tobacco: Never Used  Substance Use Topics  . Alcohol use: No    Review of Systems  DATA OBTAINED: from nurse GENERAL:  no fevers, fatigue, appetite changes SKIN: No itching, rash HEENT: No complaint RESPIRATORY: No cough, wheezing, SOB CARDIAC: No chest pain, palpitations, lower extremity edema  GI: No abdominal pain, No N/V/D or constipation, No heartburn or reflux  GU: No dysuria, frequency or urgency, or incontinence  MUSCULOSKELETAL: No unrelieved bone/joint pain NEUROLOGIC: No headache, dizziness  PSYCHIATRIC: No overt anxiety or sadness  Vitals:   11/14/18 1620  BP: 128/75  Pulse: 81  Resp: 18  Temp: 98.2 F (36.8 C)   Body mass index is 25.32 kg/m. Physical Exam  GENERAL APPEARANCE: Alert, conversant, No  acute distress  SKIN: No diaphoresis rash HEENT: Unremarkable RESPIRATORY: Breathing is even, unlabored. Lung sounds are clear   CARDIOVASCULAR: Heart RRR 2/6 murmur, no rubs or gallops. No peripheral edema  GASTROINTESTINAL: Abdomen is  not distended   GENITOURINARY: Bladder  not distended  MUSCULOSKELETAL: No abnormal joints or musculature NEUROLOGIC: Cranial nerves 2-12 grossly intact. Moves all extremities PSYCHIATRIC: Mood and affect dementia, no behavioral issues  Patient Active Problem List   Diagnosis Date Noted  . Overactive bladder 09/24/2017  . Insomnia 07/31/2017  . Anxiety 06/16/2017  . Citrobacter infection 03/19/2017  . AKI (acute kidney injury) ([facility]) 03/19/2017  . History of pulmonary embolus (PE) 03/19/2017  . Elevated INR 03/19/2017  . Candidal intertrigo 03/19/2017  . Compression fracture of L2 ([facility]) 03/12/2017  . Acute lower UTI 03/12/2017  . Accelerated hypertension 03/12/2017  . Leukocytosis 07/31/2015  . Acute bronchitis 07/28/2015  . Chronic anticoagulation 07/28/2015  . Intractable back pain 07/20/2015  . Back pain 07/20/2015  . Cellulitis and abscess of left buttock  04/12/2013  . Stricture and stenosis of esophagus 10/10/2012  . Dementia ([facility]) 10/06/2012  . Dysphagia 10/06/2012  . Axillary abscess - bilateral, multiple 09/27/2012  . Hypotension 08/24/2012  . Near syncope 08/24/2012  . Abscess of axilla, left 06/27/2012  . Bradycardia 06/13/2012  . Abscess 06/11/2012  . Chest pain 06/09/2012  . Cellulitis 06/09/2012  . Weakness of both legs 06/09/2012  . PMR (polymyalgia rheumatica) ([facility])   . Restless leg syndrome   . Angina pectoris, unstable ([facility]) 03/02/2012  . Anemia 03/02/2012  . Dyspnea 02/03/2012  . UTI (urinary tract infection) 02/03/2012  . CAD (coronary artery disease)   . HTN (hypertension)   . VTE (venous thromboembolism) 10/01/2010  . HYPERCHOLESTEROLEMIA  IIA 08/27/2009  . Hyperlipidemia 12/11/2008  . HYPERTENSION, BENIGN 12/11/2008    CMP     Component Value Date/Time   NA 141 08/01/2018   K 4.6 08/01/2018   CL 111 03/16/2017 0400   CO2 21 (L) 03/16/2017 0400   GLUCOSE 119 (H) 03/16/2017 0400   BUN 19 08/01/2018   CREATININE 1.3 (A) 08/01/2018   CREATININE 1.03 (H) 03/16/2017 0400   CALCIUM 8.7 (L) 03/16/2017 0400   PROT 6.7 03/11/2017 2031   ALBUMIN 3.4 (L) 03/11/2017 2031   AST 9 (A) 08/01/2018   ALT 7 08/01/2018   ALKPHOS 58 08/01/2018   BILITOT 0.5 03/11/2017 2031   GFRNONAA 45 (L) 03/16/2017 0400   GFRAA 53 (L) 03/16/2017 0400   Recent Labs    08/01/18  NA 141  K 4.6  BUN 19  CREATININE 1.3*   Recent Labs    08/01/18  AST 9*  ALT 7  ALKPHOS 58   Recent Labs    08/01/18  WBC 6.3  HGB 9.5*  HCT 30*  PLT 246   Recent Labs    08/01/18  CHOL 140  LDLCALC 80  TRIG 82   No results found for: MICROALBUR Lab Results  Component Value Date   TSH 1.71 08/01/2018   Lab Results  Component Value Date   HGBA1C 5.7 08/01/2018   Lab Results  Component Value Date   CHOL 140 08/01/2018   HDL 46 08/01/2018   LDLCALC 80 08/01/2018   TRIG 82 08/01/2018   CHOLHDL 2.4 03/03/2012    Significant  Diagnostic Results in last 30 days:  No results found.  Assessment and Plan  HYPERTENSION, BENIGN Controlled; continue Norvasc 10 mg daily, hydralazine 25 mg every 8 and losartan 50  mg daily  CAD (coronary artery disease) No reports of chest pain or other; continue ASA 81 mg daily with sublingual nitroglycerin as needed; patient is on statin  VTE (venous thromboembolism) Stable; continue warfarin which is actively titrated    Webb Silversmith D. [name] Coil, MD

## 2018-11-15 ENCOUNTER — Non-Acute Institutional Stay (SKILLED_NURSING_FACILITY): Payer: Medicare Other | Admitting: Internal Medicine

## 2018-11-15 DIAGNOSIS — I8290 Acute embolism and thrombosis of unspecified vein: Secondary | ICD-10-CM | POA: Diagnosis not present

## 2018-11-15 DIAGNOSIS — I10 Essential (primary) hypertension: Secondary | ICD-10-CM

## 2018-11-15 DIAGNOSIS — I2510 Atherosclerotic heart disease of native coronary artery without angina pectoris: Secondary | ICD-10-CM | POA: Diagnosis not present

## 2018-11-17 ENCOUNTER — Encounter: Payer: Self-pay | Admitting: Internal Medicine

## 2018-11-17 NOTE — Assessment & Plan Note (Signed)
No reports of chest pain or other; continue ASA 81 mg daily with sublingual nitroglycerin as needed; patient is on statin

## 2018-11-17 NOTE — Assessment & Plan Note (Signed)
Controlled; continue Norvasc 10 mg daily, hydralazine 25 mg every 8 and losartan 50 mg daily

## 2018-11-17 NOTE — Assessment & Plan Note (Signed)
Stable; continue warfarin which is actively titrated

## 2018-11-27 DIAGNOSIS — F329 Major depressive disorder, single episode, unspecified: Secondary | ICD-10-CM | POA: Diagnosis not present

## 2018-11-27 DIAGNOSIS — F0390 Unspecified dementia without behavioral disturbance: Secondary | ICD-10-CM | POA: Diagnosis not present

## 2018-11-27 DIAGNOSIS — F419 Anxiety disorder, unspecified: Secondary | ICD-10-CM | POA: Diagnosis not present

## 2018-11-30 DIAGNOSIS — M353 Polymyalgia rheumatica: Secondary | ICD-10-CM | POA: Diagnosis not present

## 2018-12-11 ENCOUNTER — Emergency Department (HOSPITAL_COMMUNITY): Payer: Medicare Other

## 2018-12-11 ENCOUNTER — Inpatient Hospital Stay (HOSPITAL_COMMUNITY)
Admission: AD | Admit: 2018-12-11 | Discharge: 2018-12-18 | DRG: 481 | Disposition: A | Discharge status: Skilled Nursing Facility | Payer: Medicare Other | Attending: Family Medicine | Admitting: Family Medicine

## 2018-12-11 ENCOUNTER — Encounter (HOSPITAL_COMMUNITY): Payer: Self-pay | Admitting: Family Medicine

## 2018-12-11 DIAGNOSIS — N183 Chronic kidney disease, stage 3 unspecified: Secondary | ICD-10-CM | POA: Diagnosis present

## 2018-12-11 DIAGNOSIS — S72142A Displaced intertrochanteric fracture of left femur, initial encounter for closed fracture: Secondary | ICD-10-CM | POA: Diagnosis not present

## 2018-12-11 DIAGNOSIS — M353 Polymyalgia rheumatica: Secondary | ICD-10-CM | POA: Diagnosis present

## 2018-12-11 DIAGNOSIS — S72142D Displaced intertrochanteric fracture of left femur, subsequent encounter for closed fracture with routine healing: Secondary | ICD-10-CM | POA: Diagnosis not present

## 2018-12-11 DIAGNOSIS — Z91013 Allergy to seafood: Secondary | ICD-10-CM

## 2018-12-11 DIAGNOSIS — I2510 Atherosclerotic heart disease of native coronary artery without angina pectoris: Secondary | ICD-10-CM | POA: Diagnosis not present

## 2018-12-11 DIAGNOSIS — W19XXXA Unspecified fall, initial encounter: Secondary | ICD-10-CM | POA: Diagnosis not present

## 2018-12-11 DIAGNOSIS — M79662 Pain in left lower leg: Secondary | ICD-10-CM | POA: Diagnosis not present

## 2018-12-11 DIAGNOSIS — R262 Difficulty in walking, not elsewhere classified: Secondary | ICD-10-CM | POA: Diagnosis not present

## 2018-12-11 DIAGNOSIS — Z7952 Long term (current) use of systemic steroids: Secondary | ICD-10-CM | POA: Diagnosis not present

## 2018-12-11 DIAGNOSIS — D649 Anemia, unspecified: Secondary | ICD-10-CM | POA: Diagnosis not present

## 2018-12-11 DIAGNOSIS — R52 Pain, unspecified: Secondary | ICD-10-CM | POA: Diagnosis not present

## 2018-12-11 DIAGNOSIS — Z885 Allergy status to narcotic agent status: Secondary | ICD-10-CM | POA: Diagnosis not present

## 2018-12-11 DIAGNOSIS — M542 Cervicalgia: Secondary | ICD-10-CM | POA: Diagnosis not present

## 2018-12-11 DIAGNOSIS — Z955 Presence of coronary angioplasty implant and graft: Secondary | ICD-10-CM | POA: Diagnosis not present

## 2018-12-11 DIAGNOSIS — Z86711 Personal history of pulmonary embolism: Secondary | ICD-10-CM | POA: Diagnosis present

## 2018-12-11 DIAGNOSIS — Z9181 History of falling: Secondary | ICD-10-CM | POA: Diagnosis not present

## 2018-12-11 DIAGNOSIS — I10 Essential (primary) hypertension: Secondary | ICD-10-CM | POA: Diagnosis not present

## 2018-12-11 DIAGNOSIS — S0990XA Unspecified injury of head, initial encounter: Secondary | ICD-10-CM | POA: Diagnosis not present

## 2018-12-11 DIAGNOSIS — M25552 Pain in left hip: Secondary | ICD-10-CM | POA: Diagnosis not present

## 2018-12-11 DIAGNOSIS — D62 Acute posthemorrhagic anemia: Secondary | ICD-10-CM | POA: Diagnosis not present

## 2018-12-11 DIAGNOSIS — W010XXA Fall on same level from slipping, tripping and stumbling without subsequent striking against object, initial encounter: Secondary | ICD-10-CM | POA: Diagnosis present

## 2018-12-11 DIAGNOSIS — Z882 Allergy status to sulfonamides status: Secondary | ICD-10-CM

## 2018-12-11 DIAGNOSIS — Z79899 Other long term (current) drug therapy: Secondary | ICD-10-CM

## 2018-12-11 DIAGNOSIS — R488 Other symbolic dysfunctions: Secondary | ICD-10-CM | POA: Diagnosis not present

## 2018-12-11 DIAGNOSIS — S199XXA Unspecified injury of neck, initial encounter: Secondary | ICD-10-CM | POA: Diagnosis not present

## 2018-12-11 DIAGNOSIS — I69828 Other speech and language deficits following other cerebrovascular disease: Secondary | ICD-10-CM | POA: Diagnosis not present

## 2018-12-11 DIAGNOSIS — F418 Other specified anxiety disorders: Secondary | ICD-10-CM | POA: Diagnosis not present

## 2018-12-11 DIAGNOSIS — G2581 Restless legs syndrome: Secondary | ICD-10-CM | POA: Diagnosis not present

## 2018-12-11 DIAGNOSIS — Z888 Allergy status to other drugs, medicaments and biological substances status: Secondary | ICD-10-CM

## 2018-12-11 DIAGNOSIS — M79652 Pain in left thigh: Secondary | ICD-10-CM | POA: Diagnosis not present

## 2018-12-11 DIAGNOSIS — L8990 Pressure ulcer of unspecified site, unspecified stage: Secondary | ICD-10-CM

## 2018-12-11 DIAGNOSIS — E440 Moderate protein-calorie malnutrition: Secondary | ICD-10-CM | POA: Diagnosis not present

## 2018-12-11 DIAGNOSIS — R2681 Unsteadiness on feet: Secondary | ICD-10-CM | POA: Diagnosis not present

## 2018-12-11 DIAGNOSIS — Z01818 Encounter for other preprocedural examination: Secondary | ICD-10-CM | POA: Diagnosis not present

## 2018-12-11 DIAGNOSIS — K219 Gastro-esophageal reflux disease without esophagitis: Secondary | ICD-10-CM | POA: Diagnosis present

## 2018-12-11 DIAGNOSIS — F0390 Unspecified dementia without behavioral disturbance: Secondary | ICD-10-CM | POA: Diagnosis present

## 2018-12-11 DIAGNOSIS — K5900 Constipation, unspecified: Secondary | ICD-10-CM | POA: Diagnosis not present

## 2018-12-11 DIAGNOSIS — M2550 Pain in unspecified joint: Secondary | ICD-10-CM | POA: Diagnosis not present

## 2018-12-11 DIAGNOSIS — S72002A Fracture of unspecified part of neck of left femur, initial encounter for closed fracture: Secondary | ICD-10-CM | POA: Diagnosis not present

## 2018-12-11 DIAGNOSIS — M545 Low back pain: Secondary | ICD-10-CM | POA: Diagnosis not present

## 2018-12-11 DIAGNOSIS — Y92129 Unspecified place in nursing home as the place of occurrence of the external cause: Secondary | ICD-10-CM | POA: Diagnosis not present

## 2018-12-11 DIAGNOSIS — Z880 Allergy status to penicillin: Secondary | ICD-10-CM | POA: Diagnosis not present

## 2018-12-11 DIAGNOSIS — I129 Hypertensive chronic kidney disease with stage 1 through stage 4 chronic kidney disease, or unspecified chronic kidney disease: Secondary | ICD-10-CM | POA: Diagnosis present

## 2018-12-11 DIAGNOSIS — R41841 Cognitive communication deficit: Secondary | ICD-10-CM | POA: Diagnosis not present

## 2018-12-11 DIAGNOSIS — Z86718 Personal history of other venous thrombosis and embolism: Secondary | ICD-10-CM

## 2018-12-11 DIAGNOSIS — Z8249 Family history of ischemic heart disease and other diseases of the circulatory system: Secondary | ICD-10-CM

## 2018-12-11 DIAGNOSIS — E785 Hyperlipidemia, unspecified: Secondary | ICD-10-CM | POA: Diagnosis not present

## 2018-12-11 DIAGNOSIS — Z419 Encounter for procedure for purposes other than remedying health state, unspecified: Secondary | ICD-10-CM

## 2018-12-11 DIAGNOSIS — Z7982 Long term (current) use of aspirin: Secondary | ICD-10-CM

## 2018-12-11 DIAGNOSIS — Z66 Do not resuscitate: Secondary | ICD-10-CM | POA: Diagnosis present

## 2018-12-11 DIAGNOSIS — M6281 Muscle weakness (generalized): Secondary | ICD-10-CM | POA: Diagnosis not present

## 2018-12-11 DIAGNOSIS — S72009A Fracture of unspecified part of neck of unspecified femur, initial encounter for closed fracture: Secondary | ICD-10-CM | POA: Diagnosis not present

## 2018-12-11 DIAGNOSIS — Z7401 Bed confinement status: Secondary | ICD-10-CM | POA: Diagnosis not present

## 2018-12-11 DIAGNOSIS — I69891 Dysphagia following other cerebrovascular disease: Secondary | ICD-10-CM | POA: Diagnosis not present

## 2018-12-11 LAB — BASIC METABOLIC PANEL
Anion gap: 10 (ref 5–15)
BUN: 20 mg/dL (ref 8–23)
CO2: 22 mmol/L (ref 22–32)
Calcium: 8.8 mg/dL — ABNORMAL LOW (ref 8.9–10.3)
Chloride: 105 mmol/L (ref 98–111)
Creatinine, Ser: 1.11 mg/dL — ABNORMAL HIGH (ref 0.44–1.00)
GFR calc Af Amer: 49 mL/min — ABNORMAL LOW (ref >60)
GFR calc non Af Amer: 42 mL/min — ABNORMAL LOW (ref >60)
GLUCOSE: 123 mg/dL — AB (ref 70–99)
Potassium: 4.2 mmol/L (ref 3.5–5.1)
Sodium: 137 mmol/L (ref 135–145)

## 2018-12-11 LAB — CBC WITH DIFFERENTIAL/PLATELET
ABS IMMATURE GRANULOCYTES: 0.08 10*3/uL — AB (ref 0.00–0.07)
Basophils Absolute: 0 10*3/uL (ref 0.0–0.1)
Basophils Relative: 0 %
Eosinophils Absolute: 0 10*3/uL (ref 0.0–0.5)
Eosinophils Relative: 0 %
HCT: 36.4 % (ref 36.0–46.0)
Hemoglobin: 11 g/dL — ABNORMAL LOW (ref 12.0–15.0)
Immature Granulocytes: 1 %
Lymphocytes Relative: 7 %
Lymphs Abs: 1 10*3/uL (ref 0.7–4.0)
MCH: 25.3 pg — ABNORMAL LOW (ref 26.0–34.0)
MCHC: 30.2 g/dL (ref 30.0–36.0)
MCV: 83.7 fL (ref 80.0–100.0)
MONOS PCT: 6 %
Monocytes Absolute: 0.8 10*3/uL (ref 0.1–1.0)
Neutro Abs: 12.6 10*3/uL — ABNORMAL HIGH (ref 1.7–7.7)
Neutrophils Relative %: 86 %
PLATELETS: 311 10*3/uL (ref 150–400)
RBC: 4.35 MIL/uL (ref 3.87–5.11)
RDW: 15.7 % — ABNORMAL HIGH (ref 11.5–15.5)
WBC: 14.6 10*3/uL — ABNORMAL HIGH (ref 4.0–10.5)
nRBC: 0 % (ref 0.0–0.2)

## 2018-12-11 LAB — PROTIME-INR
INR: 2.78
Prothrombin Time: 29 seconds — ABNORMAL HIGH (ref 11.4–15.2)

## 2018-12-11 MED ORDER — BISACODYL 10 MG RE SUPP: 10.0000 mg | Freq: Every day | RECTAL | Status: DC | PRN

## 2018-12-11 MED ORDER — ROPINIROLE HCL 1 MG PO TABS
1.0000 mg | ORAL_TABLET | Freq: Every day | ORAL | Status: DC
  Administered 2018-12-12 – 2018-12-17 (×7): 1 mg via ORAL
  Filled 2018-12-11 (×8): qty 1

## 2018-12-11 MED ORDER — PHYTONADIONE 5 MG PO TABS
5.0000 mg | ORAL_TABLET | Freq: Once | ORAL | Status: AC
  Administered 2018-12-12: 5 mg via ORAL
  Filled 2018-12-11: qty 1

## 2018-12-11 MED ORDER — OXYBUTYNIN CHLORIDE 5 MG PO TABS
5.0000 mg | ORAL_TABLET | Freq: Two times a day (BID) | ORAL | Status: DC
  Administered 2018-12-12 – 2018-12-18 (×12): 5 mg via ORAL
  Filled 2018-12-11 (×12): qty 1

## 2018-12-11 MED ORDER — FLEET ENEMA 7-19 GM/118ML RE ENEM: 1.0000 | ENEMA | Freq: Once | RECTAL | Status: DC | PRN

## 2018-12-11 MED ORDER — FENTANYL CITRATE (PF) 100 MCG/2ML IJ SOLN
25.0000 ug | INTRAMUSCULAR | Status: DC | PRN
  Administered 2018-12-12 (×4): 50 ug via INTRAVENOUS
  Filled 2018-12-11 (×5): qty 2

## 2018-12-11 MED ORDER — ALBUTEROL SULFATE HFA 108 (90 BASE) MCG/ACT IN AERS: 2.0000 | INHALATION_SPRAY | Freq: Four times a day (QID) | RESPIRATORY_TRACT | Status: DC | PRN

## 2018-12-11 MED ORDER — AMLODIPINE BESYLATE 10 MG PO TABS
10.0000 mg | ORAL_TABLET | Freq: Every morning | ORAL | Status: DC
  Administered 2018-12-12 – 2018-12-18 (×7): 10 mg via ORAL
  Filled 2018-12-11 (×7): qty 1

## 2018-12-11 MED ORDER — PREDNISONE 1 MG PO TABS
1.0000 mg | ORAL_TABLET | Freq: Every day | ORAL | Status: DC
  Administered 2018-12-12 – 2018-12-18 (×6): 1 mg via ORAL
  Filled 2018-12-11 (×7): qty 1

## 2018-12-11 MED ORDER — HYDRALAZINE HCL 25 MG PO TABS
25.0000 mg | ORAL_TABLET | Freq: Three times a day (TID) | ORAL | Status: DC
  Administered 2018-12-12 – 2018-12-13 (×5): 25 mg via ORAL
  Filled 2018-12-11 (×6): qty 1

## 2018-12-11 MED ORDER — FENTANYL CITRATE (PF) 100 MCG/2ML IJ SOLN
50.0000 ug | INTRAMUSCULAR | Status: AC | PRN
  Administered 2018-12-11 – 2018-12-12 (×2): 50 ug via INTRAVENOUS
  Filled 2018-12-11 (×2): qty 2

## 2018-12-11 MED ORDER — MIRTAZAPINE 15 MG PO TABS
7.5000 mg | ORAL_TABLET | Freq: Every day | ORAL | Status: DC
  Administered 2018-12-12 – 2018-12-17 (×7): 7.5 mg via ORAL
  Filled 2018-12-11 (×7): qty 1

## 2018-12-11 MED ORDER — DONEPEZIL HCL 10 MG PO TABS
10.0000 mg | ORAL_TABLET | Freq: Every evening | ORAL | Status: DC
  Administered 2018-12-12 – 2018-12-18 (×7): 10 mg via ORAL
  Filled 2018-12-11 (×8): qty 1

## 2018-12-11 MED ORDER — POLYETHYLENE GLYCOL 3350 17 G PO PACK: 17.0000 g | PACK | Freq: Every day | ORAL | Status: DC | PRN

## 2018-12-11 MED ORDER — DOXEPIN HCL 10 MG/ML PO CONC
6.0000 mg | Freq: Every day | ORAL | Status: DC
  Administered 2018-12-12 – 2018-12-17 (×7): 6 mg via ORAL
  Filled 2018-12-11 (×8): qty 0.6

## 2018-12-11 MED ORDER — ALPRAZOLAM 0.5 MG PO TABS
0.5000 mg | ORAL_TABLET | Freq: Every day | ORAL | Status: DC
  Administered 2018-12-12 – 2018-12-16 (×4): 0.5 mg via ORAL
  Filled 2018-12-11 (×4): qty 1

## 2018-12-11 MED ORDER — ATORVASTATIN CALCIUM 10 MG PO TABS
10.0000 mg | ORAL_TABLET | Freq: Every evening | ORAL | Status: DC
  Administered 2018-12-12 – 2018-12-18 (×7): 10 mg via ORAL
  Filled 2018-12-11 (×7): qty 1

## 2018-12-11 NOTE — ED Provider Notes (Signed)
[facility] DEPT Provider Note   CSN: [id number] Arrival date & time: 12/11/18  2119     History   Chief Complaint Chief Complaint  Patient presents with  . Fall    HPI Erma Raiche Ivins is a 85 y.o. female.  HPI  85 year old female presents after a fall.  She was trying to stand up and lost her balance.  X-ray at her nursing facility showed a hip fracture.  She is also complaining of acute on chronic low back pain.  She is brought in on a c-collar though she denies significant head or neck injury.  She is on warfarin for remote PE.  Past Medical History:  Diagnosis Date  . Anemia 03/02/2012  . Angina pectoris, unstable ([place]) 03/02/2012  . Anxiety   . Axillary abscess   . Bradycardia 06/13/2012  . CAD (coronary artery disease)    S/p PTCA / stenting (last cath 2004, multiple LAD stents, 2 stents in the right coronary artery all patent)  . Cancer ([place])   . Complication of anesthesia   . Compression fracture of L2 ([place]) 03/12/2017  . Dementia ([place])   . Dysphagia 10/06/2012  . Dyspnea 02/03/2012  . GERD (gastroesophageal reflux disease)   . History of pulmonary embolus (PE) 03/19/2017  . HTN (hypertension)   . PMR (polymyalgia rheumatica) (HCC)   . PONV (postoperative nausea and vomiting)   . Restless leg syndrome   . 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 Started coumadin  . Weakness of both legs 06/09/2012    Patient Active Problem List   Diagnosis Date Noted  . Overactive bladder 09/24/2017  . Insomnia 07/31/2017  . Anxiety 06/16/2017  . Citrobacter infection 03/19/2017  . AKI (acute kidney injury) ([place]) 03/19/2017  . History of pulmonary embolus (PE) 03/19/2017  . Elevated INR 03/19/2017  . Candidal intertrigo 03/19/2017  . Compression fracture of L2 ([place]) 03/12/2017  . Acute lower UTI 03/12/2017  . Accelerated hypertension 03/12/2017  . Leukocytosis 07/31/2015  . Acute bronchitis 07/28/2015  . Chronic anticoagulation  07/28/2015  . Intractable back pain 07/20/2015  . Back pain 07/20/2015  . Cellulitis and abscess of left buttock 04/12/2013  . Stricture and stenosis of esophagus 10/10/2012  . Dementia ([place]) 10/06/2012  . Dysphagia 10/06/2012  . Axillary abscess - bilateral, multiple 09/27/2012  . Hypotension 08/24/2012  . Near syncope 08/24/2012  . Abscess of axilla, left 06/27/2012  . Bradycardia 06/13/2012  . Abscess 06/11/2012  . Chest pain 06/09/2012  . Cellulitis 06/09/2012  . Weakness of both legs 06/09/2012  . PMR (polymyalgia rheumatica) (HCC)   . Restless leg syndrome   . Angina pectoris, unstable ([place]) 03/02/2012  . Anemia 03/02/2012  . Dyspnea 02/03/2012  . UTI (urinary tract infection) 02/03/2012  . CAD (coronary artery disease)   . HTN (hypertension)   . VTE (venous thromboembolism) 10/01/2010  . HYPERCHOLESTEROLEMIA  IIA 08/27/2009  . Hyperlipidemia 12/11/2008  . HYPERTENSION, BENIGN 12/11/2008    Past Surgical History:  Procedure Laterality Date  . BREAST LUMPECTOMY    . COLECTOMY    . ESOPHAGOGASTRODUODENOSCOPY (EGD) WITH ESOPHAGEAL DILATION  10/10/2012   Procedure: ESOPHAGOGASTRODUODENOSCOPY (EGD) WITH ESOPHAGEAL DILATION;  Surgeon: Inda Castle, MD;  Location: [facility];  Service: Endoscopy;  Laterality: N/A;  . heart stents  x 8  . INCISION AND DRAINAGE     bilateral axillary, non specific staff  . INCISION AND DRAINAGE ABSCESS  09/28/2012   Procedure: INCISION AND DRAINAGE ABSCESS;  Surgeon: Zenovia Jarred, MD;  Location: [facility];  Service: General;  Laterality: Bilateral;  . stent     cardiac x 8 stents.     OB History   No obstetric history on file.      Home Medications    Prior to Admission medications   Medication Sig Start Date End Date Taking? Authorizing Provider  ALPRAZolam Duanne Moron) 0.5 MG tablet Take 1 tablet (0.5 mg total) by mouth daily. 10/23/18  Yes Hennie Duos, MD  amLODipine (NORVASC) 10 MG tablet Take 10 mg by mouth every  morning.    Yes [provider]  aspirin EC 81 MG tablet Take 1 tablet (81 mg total) by mouth daily. 03/01/14  Yes Regalado, Belkys A, MD  atorvastatin (LIPITOR) 10 MG tablet Take 10 mg by mouth every evening.   Yes [provider]  bisacodyl (DULCOLAX) 10 MG suppository Place 10 mg rectally as needed for moderate constipation.   Yes [provider]  cholecalciferol (VITAMIN D) 1000 UNITS tablet Take 2,000 Units by mouth daily with lunch. 2 tabs   Yes [provider]  donepezil (ARICEPT) 10 MG tablet Take 10 mg by mouth every evening.    Yes [provider]  hydrALAZINE (APRESOLINE) 25 MG tablet Take 1 tablet (25 mg total) by mouth every 8 (eight) hours. 03/16/17  Yes Mikhail, Maryann, DO  losartan (COZAAR) 50 MG tablet Take 50 mg by mouth at bedtime.    Yes [provider]  magnesium hydroxide (MILK OF MAGNESIA) 400 MG/5ML suspension Take 30 mLs by mouth daily as needed for mild constipation.   Yes [provider]  mirtazapine (REMERON) 15 MG tablet Take 7.5 mg by mouth at bedtime.    Yes [provider]  oxybutynin (DITROPAN) 5 MG tablet Take 5 mg by mouth 2 (two) times daily.  04/15/16  Yes [provider]  polyethylene glycol (MIRALAX / GLYCOLAX) packet Take 17 g by mouth daily as needed for moderate constipation.   Yes [provider]  potassium chloride SA (K-DUR,KLOR-CON) 20 MEQ tablet Take 20 mEq by mouth daily with lunch.    Yes [provider]  predniSONE (DELTASONE) 1 MG tablet Take 1 mg by mouth daily with breakfast.    Yes [provider]  rOPINIRole (REQUIP) 1 MG tablet Take 1 mg by mouth at bedtime. 1mg  at noon, 1mg  2 hours before bed, then 1mg  at bedtime   Yes [provider]  SILENOR 6 MG TABS Take 6 mg by mouth at bedtime.  04/28/16  Yes [provider]  Sodium Phosphates (ENEMA RE) Place 1 enema rectally daily as needed (constipation).   Yes [provider]  traMADol (ULTRAM) 50 MG tablet Take 50 mg by mouth at bedtime.   Yes [provider]  warfarin (COUMADIN) 3 MG tablet Take 3 mg by mouth daily.    Yes [provider]  albuterol (PROAIR HFA) 108 (90 Base) MCG/ACT inhaler Inhale 2 puffs into the lungs every 6 (six) hours as needed for wheezing or shortness of breath.    [provider]  calcium carbonate (TUMS) 500 MG chewable tablet Chew 1 tablet by mouth 3 (three) times daily as needed for indigestion or heartburn.    [provider]  nitroGLYCERIN (NITROSTAT) 0.4 MG SL tablet Place 0.4 mg under the tongue every 5 (five) minutes as needed for chest pain.    [provider]  ondansetron (ZOFRAN) 4 MG tablet Take 4 mg by  mouth every 4 (four) hours as needed for nausea.     [provider]    Family History Family History  Problem Relation Age of Onset  . Heart failure Mother   . Stroke Father   . Breast cancer Sister   . Heart disease Sister   . Heart disease Brother     Social History Social History   Tobacco Use  . Smoking status: Never Smoker  . Smokeless tobacco: Never Used  Substance Use Topics  . Alcohol use: No  . Drug use: No     Allergies   Ambien [zolpidem tartrate]; Codeine phosphate; Darvocet [propoxyphene n-acetaminophen]; Fish-derived products; Promethazine hcl; Sulfamethoxazole; Vicodin [hydrocodone-acetaminophen]; Amoxicillin; and Penicillins   Review of Systems Review of Systems  Gastrointestinal: Negative for abdominal pain.  Musculoskeletal: Positive for arthralgias and back pain. Negative for neck pain.  Neurological: Negative for weakness, numbness and headaches.  All other systems reviewed and are negative.    Physical Exam Updated Vital Signs BP (!) 182/61   Pulse 64   Temp 98.2 F (36.8 C) (Oral)   Resp 17   Ht 5\' 2"  (1.575 m)   Wt 57.2 kg   LMP  (LMP Unknown)   SpO2 95%   BMI 23.05 kg/m   Physical Exam Vitals signs and nursing  note reviewed.  Constitutional:      General: She is not in acute distress.    Appearance: She is well-developed.     Interventions: Cervical collar in place.  HENT:     Head: Normocephalic and atraumatic.     Right Ear: External ear normal.     Left Ear: External ear normal.     Nose: Nose normal.  Eyes:     General:        Right eye: No discharge.        Left eye: No discharge.  Cardiovascular:     Rate and Rhythm: Normal rate and regular rhythm.     Pulses:          Dorsalis pedis pulses are 2+ on the left side.     Heart sounds: Normal heart sounds.  Pulmonary:     Effort: Pulmonary effort is normal.     Breath sounds: Normal breath sounds.  Abdominal:     Palpations: Abdomen is soft.     Tenderness: There is no abdominal tenderness.  Musculoskeletal:     Left hip: She exhibits tenderness, swelling and deformity.     Comments: Left leg is shortened, externally rotated. Mild tenderness over left iliac crest Left foot warm, well perfused. Normal strength/sensation in left foot  Skin:    General: Skin is warm and dry.  Neurological:     Mental Status: She is alert.  Psychiatric:        Mood and Affect: Mood is not anxious.      ED Treatments / Results  Labs (all labs ordered are listed, but only abnormal results are displayed) Labs Reviewed  BASIC METABOLIC PANEL - Abnormal; Notable for the following components:      Result Value   Glucose, Bld 123 (*)    Creatinine, Ser 1.11 (*)    Calcium 8.8 (*)    GFR calc non Af Amer 42 (*)    GFR calc Af Amer 49 (*)    All other components within normal limits  CBC WITH DIFFERENTIAL/PLATELET - Abnormal; Notable for the following components:   WBC 14.6 (*)    Hemoglobin 11.0 (*)  MCH 25.3 (*)    RDW 15.7 (*)    Neutro Abs 12.6 (*)    Abs Immature Granulocytes 0.08 (*)    All other components within normal limits  PROTIME-INR - Abnormal; Notable for the following components:   Prothrombin Time 29.0 (*)    All other  components within normal limits  TYPE AND SCREEN    EKG EKG Interpretation  Date/Time:  Monday December 11 2018 22:41:53 EST Ventricular Rate:  60 PR Interval:    QRS Duration: 92 QT Interval:  443 QTC Calculation: 443 R Axis:   39 Text Interpretation:  Sinus rhythm Probable anteroseptal infarct, old no significant change since 2018 Confirmed by Sherwood Gambler [phone]) on 12/11/2018 10:59:29 PM   Radiology Dg Lumbar Spine Complete  Result Date: 12/11/2018 CLINICAL DATA:  Recent fall with low back pain EXAM: LUMBAR SPINE - COMPLETE 4+ VIEW COMPARISON:  03/08/2017 FINDINGS: Stable compression deformities of L2 and L3 are noted. Degenerative changes are noted worst at L4-5 facet hypertrophic changes are noted. No acute bony abnormality is seen. IMPRESSION: Chronic degenerative changes and compression fractures without acute abnormality. Electronically Signed   By: Inez Catalina M.D.   On: 12/11/2018 22:28   Dg Pelvis 1-2 Views  Result Date: 12/11/2018 CLINICAL DATA:  Recent fall with left leg pain, initial encounter EXAM: PELVIS - 1-2 VIEW COMPARISON:  None. FINDINGS: Pelvic ring is intact. Comminuted intratrochanteric left femoral fracture is noted with impaction and mild angulation at the fracture site. Femoral head is well seated. IMPRESSION: Comminuted intratrochanteric left femoral fracture. Electronically Signed   By: Inez Catalina M.D.   On: 12/11/2018 22:27   Ct Head Wo Contrast  Result Date: 12/11/2018 CLINICAL DATA:  Recent fall EXAM: CT HEAD WITHOUT CONTRAST CT CERVICAL SPINE WITHOUT CONTRAST TECHNIQUE: Multidetector CT imaging of the head and cervical spine was performed following the standard protocol without intravenous contrast. Multiplanar CT image reconstructions of the cervical spine were also generated. COMPARISON:  11/14/15 FINDINGS: CT HEAD FINDINGS Brain: Chronic white matter ischemic changes are noted. No findings to suggest acute hemorrhage, acute infarction or  space-occupying mass lesion are noted. Dense lesion is again identified within the sella stable from the prior exam. Vascular: No hyperdense vessel or unexpected calcification. Skull: Normal. Negative for fracture or focal lesion. Sinuses/Orbits: No acute finding. Other: None CT CERVICAL SPINE FINDINGS Alignment: Within normal limits. Skull base and vertebrae: 7 cervical segments are well visualized. Vertebral body height is well maintained. No acute fracture or acute facet abnormality is noted. Facet hypertrophic changes are seen. No significant neural foraminal narrowing is noted. Soft tissues and spinal canal: Surrounding soft tissues are unremarkable. Upper chest: Visualized upper lung fields are within normal limits. Other: None IMPRESSION: CT of the head: No change from the prior exam. No acute abnormality noted. CT of the cervical spine: Multilevel degenerative change without acute abnormality. Electronically Signed   By: Inez Catalina M.D.   On: 12/11/2018 22:16   Ct Cervical Spine Wo Contrast  Result Date: 12/11/2018 CLINICAL DATA:  Recent fall EXAM: CT HEAD WITHOUT CONTRAST CT CERVICAL SPINE WITHOUT CONTRAST TECHNIQUE: Multidetector CT imaging of the head and cervical spine was performed following the standard protocol without intravenous contrast. Multiplanar CT image reconstructions of the cervical spine were also generated. COMPARISON:  11/14/15 FINDINGS: CT HEAD FINDINGS Brain: Chronic white matter ischemic changes are noted. No findings to suggest acute hemorrhage, acute infarction or space-occupying mass lesion are noted. Dense lesion is again identified within the sella  stable from the prior exam. Vascular: No hyperdense vessel or unexpected calcification. Skull: Normal. Negative for fracture or focal lesion. Sinuses/Orbits: No acute finding. Other: None CT CERVICAL SPINE FINDINGS Alignment: Within normal limits. Skull base and vertebrae: 7 cervical segments are well visualized. Vertebral body  height is well maintained. No acute fracture or acute facet abnormality is noted. Facet hypertrophic changes are seen. No significant neural foraminal narrowing is noted. Soft tissues and spinal canal: Surrounding soft tissues are unremarkable. Upper chest: Visualized upper lung fields are within normal limits. Other: None IMPRESSION: CT of the head: No change from the prior exam. No acute abnormality noted. CT of the cervical spine: Multilevel degenerative change without acute abnormality. Electronically Signed   By: Inez Catalina M.D.   On: 12/11/2018 22:16   Dg Femur Min 2 Views Left  Result Date: 12/11/2018 CLINICAL DATA:  Recent fall with left leg pain, initial encounter EXAM: LEFT FEMUR 2 VIEWS COMPARISON:  None. FINDINGS: Comminuted left intratrochanteric femoral fracture is noted. Some impaction and angulation at the fracture site is noted. No other fracture is seen. IMPRESSION: Left intratrochanteric femoral fracture. Electronically Signed   By: Inez Catalina M.D.   On: 12/11/2018 22:29    Procedures Procedures (including critical care time)  Medications Ordered in ED Medications  fentaNYL (SUBLIMAZE) injection 50 mcg (50 mcg Intravenous Given 12/11/18 2309)     Initial Impression / Assessment and Plan / ED Course  I have reviewed the triage vital signs and the nursing notes.  Pertinent labs & imaging results that were available during my care of the patient were reviewed by me and considered in my medical decision making (see chart for details).     I discussed with Dr. Alvan Dame, orthopedics will see tomorrow.  N.p.o. after midnight.  Given the fall while on warfarin, CT head/C-spine obtained and are negative. Dr Myna Hidalgo to admit.  Final Clinical Impressions(s) / ED Diagnoses   Final diagnoses:  Closed displaced intertrochanteric fracture of left femur, initial encounter [facility])    ED Discharge Orders    None       Sherwood Gambler, MD 12/11/18 2349

## 2018-12-11 NOTE — H&P (Signed)
History and Physical    SYANN CUPPLES WCH:[id number] DOB: Apr 08, 1921 DOA: 12/11/2018  PCP: Hennie Duos, MD   Patient coming from: SNF   Chief Complaint: Fall, left hip pain   HPI: RODERICA CATHELL is a 85 y.o. female with medical history significant for depression with anxiety, coronary artery disease, polymyalgia rheumatica, dementia, and hypertension, now presenting to the emergency department from her SNF for evaluation of severe left hip pain after a fall.  Patient reports that she was in her usual state of health when she fell onto her left side this evening and experienced immediate and severe pain at the left hip.  She denies any recent chest pain, cough, shortness of breath, fevers, or chills.  She does not remember exactly why she fell, but denies hitting her head or losing consciousness.  She denies any melena or hematochezia.  ED Course: Upon arrival to the ED, patient is found to be afebrile, saturating well on room air, and with vitals otherwise stable.  EKG features a sinus rhythm.  Noncontrast head CT is negative for acute intracranial abnormality and cervical spine CT is negative for acute pathology.  Plain radiographs reveal a left intertrochanteric femoral fracture and radiographs of the lumbar spine are negative for acute process.  Chemistry panel is notable for a creatinine 1.11, similar to priors.  CBC is notable for leukocytosis to 14,600 and a mild normocytic anemia.  INR is 2.78.  Patient was given 50 mcg of fentanyl in the ED and orthopedic surgery was consulted by the ED physician.  Review of Systems:  All other systems reviewed and apart from HPI, are negative.  Past Medical History:  Diagnosis Date  . Anemia 03/02/2012  . Angina pectoris, unstable ([place]) 03/02/2012  . Anxiety   . Axillary abscess   . Bradycardia 06/13/2012  . CAD (coronary artery disease)    S/p PTCA / stenting (last cath 2004, multiple LAD stents, 2 stents in the right coronary artery all  patent)  . Cancer ([place])   . Complication of anesthesia   . Compression fracture of L2 ([place]) 03/12/2017  . Dementia ([place])   . Dysphagia 10/06/2012  . Dyspnea 02/03/2012  . GERD (gastroesophageal reflux disease)   . History of pulmonary embolus (PE) 03/19/2017  . HTN (hypertension)   . PMR (polymyalgia rheumatica) ([facility])   . PONV (postoperative nausea and vomiting)   . Restless leg syndrome   . 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 Started coumadin  . Weakness of both legs 06/09/2012    Past Surgical History:  Procedure Laterality Date  . BREAST LUMPECTOMY    . COLECTOMY    . ESOPHAGOGASTRODUODENOSCOPY (EGD) WITH ESOPHAGEAL DILATION  10/10/2012   Procedure: ESOPHAGOGASTRODUODENOSCOPY (EGD) WITH ESOPHAGEAL DILATION;  Surgeon: Inda Castle, MD;  Location: [facility];  Service: Endoscopy;  Laterality: N/A;  . heart stents  x 8  . INCISION AND DRAINAGE     bilateral axillary, non specific staff  . INCISION AND DRAINAGE ABSCESS  09/28/2012   Procedure: INCISION AND DRAINAGE ABSCESS;  Surgeon: Zenovia Jarred, MD;  Location: [facility];  Service: General;  Laterality: Bilateral;  . stent     cardiac x 8 stents.     reports that she has never smoked. She has never used smokeless tobacco. She reports that she does not drink alcohol or use drugs.  Allergies  Allergen Reactions  . Ambien [Zolpidem Tartrate] Nausea And Vomiting  . Codeine Phosphate Nausea And Vomiting  .  Darvocet [Propoxyphene N-Acetaminophen] Nausea And Vomiting  . Fish-Derived Products   . Promethazine Hcl Nausea And Vomiting  . Sulfamethoxazole Other (See Comments)    Pt doesn't remember reaction  . Vicodin [Hydrocodone-Acetaminophen] Other (See Comments)    Unknown reaction  . Amoxicillin Nausea And Vomiting and Rash  . Penicillins Nausea And Vomiting and Rash    Family History  Problem Relation Age of Onset  . Heart failure Mother   . Stroke Father   . Breast cancer Sister   . Heart disease Sister    . Heart disease Brother      Prior to Admission medications   Medication Sig Start Date End Date Taking? Authorizing Provider  ALPRAZolam Duanne Moron) 0.5 MG tablet Take 1 tablet (0.5 mg total) by mouth daily. 10/23/18  Yes Hennie Duos, MD  amLODipine (NORVASC) 10 MG tablet Take 10 mg by mouth every morning.    Yes [provider]  aspirin EC 81 MG tablet Take 1 tablet (81 mg total) by mouth daily. 03/01/14  Yes Regalado, Belkys A, MD  atorvastatin (LIPITOR) 10 MG tablet Take 10 mg by mouth every evening.   Yes [provider]  bisacodyl (DULCOLAX) 10 MG suppository Place 10 mg rectally as needed for moderate constipation.   Yes [provider]  cholecalciferol (VITAMIN D) 1000 UNITS tablet Take 2,000 Units by mouth daily with lunch. 2 tabs   Yes [provider]  donepezil (ARICEPT) 10 MG tablet Take 10 mg by mouth every evening.    Yes [provider]  hydrALAZINE (APRESOLINE) 25 MG tablet Take 1 tablet (25 mg total) by mouth every 8 (eight) hours. 03/16/17  Yes Mikhail, Maryann, DO  losartan (COZAAR) 50 MG tablet Take 50 mg by mouth at bedtime.    Yes [provider]  magnesium hydroxide (MILK OF MAGNESIA) 400 MG/5ML suspension Take 30 mLs by mouth daily as needed for mild constipation.   Yes [provider]  mirtazapine (REMERON) 15 MG tablet Take 7.5 mg by mouth at bedtime.    Yes [provider]  oxybutynin (DITROPAN) 5 MG tablet Take 5 mg by mouth 2 (two) times daily.  04/15/16  Yes [provider]  polyethylene glycol (MIRALAX / GLYCOLAX) packet Take 17 g by mouth daily as needed for moderate constipation.   Yes [provider]  potassium chloride SA (K-DUR,KLOR-CON) 20 MEQ tablet Take 20 mEq by mouth daily with lunch.    Yes [provider]  predniSONE (DELTASONE) 1 MG tablet Take 1 mg by mouth daily with breakfast.    Yes [provider]  rOPINIRole (REQUIP) 1 MG tablet Take 1 mg  by mouth at bedtime. 1mg  at noon, 1mg  2 hours before bed, then 1mg  at bedtime   Yes [provider]  SILENOR 6 MG TABS Take 6 mg by mouth at bedtime.  04/28/16  Yes [provider]  Sodium Phosphates (ENEMA RE) Place 1 enema rectally daily as needed (constipation).   Yes [provider]  traMADol (ULTRAM) 50 MG tablet Take 50 mg by mouth at bedtime.   Yes [provider]  warfarin (COUMADIN) 3 MG tablet Take 3 mg by mouth daily.    Yes [provider]  albuterol (PROAIR HFA) 108 (90 Base) MCG/ACT inhaler Inhale 2 puffs into the lungs every 6 (six) hours as needed for wheezing or shortness of breath.    [provider]  calcium carbonate (TUMS) 500 MG chewable tablet Chew 1 tablet by mouth  3 (three) times daily as needed for indigestion or heartburn.    [provider]  nitroGLYCERIN (NITROSTAT) 0.4 MG SL tablet Place 0.4 mg under the tongue every 5 (five) minutes as needed for chest pain.    [provider]  ondansetron (ZOFRAN) 4 MG tablet Take 4 mg by mouth every 4 (four) hours as needed for nausea.     [provider]    Physical Exam: Vitals:   12/11/18 2105 12/11/18 2122 12/11/18 2242 12/11/18 2300  BP:  (!) 178/71  (!) 182/61  Pulse:  67  64  Resp:  14  17  Temp:  98.2 F (36.8 C)    TempSrc:  Oral    SpO2: 98% 97%  95%  Weight:   57.2 kg   Height:   5\' 2"  (1.575 m)     Constitutional: NAD, appears uncomfortable  Eyes: PERTLA, lids and conjunctivae normal ENMT: Mucous membranes are moist. Posterior pharynx clear of any exudate or lesions.   Neck: normal, supple, no masses, no thyromegaly Respiratory: clear to auscultation bilaterally, no wheezing, no crackles. Normal respiratory effort.    Cardiovascular: S1 & S2 heard, regular rate and rhythm. No extremity edema.   Abdomen: No distension, no tenderness, soft. Bowel sounds active.  Musculoskeletal: no clubbing / cyanosis. Left hip tenderness,  neurovascularly intact distally.    Skin: no significant rashes, lesions, ulcers. Warm, dry, well-perfused. Neurologic: CN 2-12 grossly intact. Sensation intact. Moving all extremities.  Psychiatric: Alert and oriented to person, place, and situation. Calm, cooperative.    Labs on Admission: I have personally reviewed following labs and imaging studies  CBC: Recent Labs  Lab 12/11/18 2308  WBC 14.6*  NEUTROABS 12.6*  HGB 11.0*  HCT 36.4  MCV 83.7  PLT 161   Basic Metabolic Panel: Recent Labs  Lab 12/11/18 2308  NA 137  K 4.2  CL 105  CO2 22  GLUCOSE 123*  BUN 20  CREATININE 1.11*  CALCIUM 8.8*   GFR: Estimated Creatinine Clearance: 24 mL/min (A) (by C-G formula based on SCr of 1.11 mg/dL (H)). Liver Function Tests: No results for input(s): AST, ALT, ALKPHOS, BILITOT, PROT, ALBUMIN in the last 168 hours. No results for input(s): LIPASE, AMYLASE in the last 168 hours. No results for input(s): AMMONIA in the last 168 hours. Coagulation Profile: Recent Labs  Lab 12/11/18 2308  INR 2.78   Cardiac Enzymes: No results for input(s): CKTOTAL, CKMB, CKMBINDEX, TROPONINI in the last 168 hours. BNP (last 3 results) No results for input(s): PROBNP in the last 8760 hours. HbA1C: No results for input(s): HGBA1C in the last 72 hours. CBG: No results for input(s): GLUCAP in the last 168 hours. Lipid Profile: No results for input(s): CHOL, HDL, LDLCALC, TRIG, CHOLHDL, LDLDIRECT in the last 72 hours. Thyroid Function Tests: No results for input(s): TSH, T4TOTAL, FREET4, T3FREE, THYROIDAB in the last 72 hours. Anemia Panel: No results for input(s): VITAMINB12, FOLATE, FERRITIN, TIBC, IRON, RETICCTPCT in the last 72 hours. Urine analysis:    Component Value Date/Time   COLORURINE YELLOW 03/11/2017 2130   APPEARANCEUR CLOUDY (A) 03/11/2017 2130   LABSPEC 1.008 03/11/2017 2130   PHURINE 6.0 03/11/2017 2130   GLUCOSEU NEGATIVE 03/11/2017 2130   HGBUR MODERATE (A) 03/11/2017  2130   BILIRUBINUR NEGATIVE 03/11/2017 2130   KETONESUR 5 (A) 03/11/2017 2130   PROTEINUR NEGATIVE 03/11/2017 2130   UROBILINOGEN 0.2 08/09/2015 2256   NITRITE POSITIVE (A) 03/11/2017 2130   LEUKOCYTESUR LARGE (A) 03/11/2017 2130  Sepsis Labs: @LABRCNTIP (procalcitonin:4,lacticidven:4) )No results found for this or any previous visit (from the past 240 hour(s)).   Radiological Exams on Admission: Dg Lumbar Spine Complete  Result Date: 12/11/2018 CLINICAL DATA:  Recent fall with low back pain EXAM: LUMBAR SPINE - COMPLETE 4+ VIEW COMPARISON:  03/08/2017 FINDINGS: Stable compression deformities of L2 and L3 are noted. Degenerative changes are noted worst at L4-5 facet hypertrophic changes are noted. No acute bony abnormality is seen. IMPRESSION: Chronic degenerative changes and compression fractures without acute abnormality. Electronically Signed   By: Inez Catalina M.D.   On: 12/11/2018 22:28   Dg Pelvis 1-2 Views  Result Date: 12/11/2018 CLINICAL DATA:  Recent fall with left leg pain, initial encounter EXAM: PELVIS - 1-2 VIEW COMPARISON:  None. FINDINGS: Pelvic ring is intact. Comminuted intratrochanteric left femoral fracture is noted with impaction and mild angulation at the fracture site. Femoral head is well seated. IMPRESSION: Comminuted intratrochanteric left femoral fracture. Electronically Signed   By: Inez Catalina M.D.   On: 12/11/2018 22:27   Ct Head Wo Contrast  Result Date: 12/11/2018 CLINICAL DATA:  Recent fall EXAM: CT HEAD WITHOUT CONTRAST CT CERVICAL SPINE WITHOUT CONTRAST TECHNIQUE: Multidetector CT imaging of the head and cervical spine was performed following the standard protocol without intravenous contrast. Multiplanar CT image reconstructions of the cervical spine were also generated. COMPARISON:  11/14/15 FINDINGS: CT HEAD FINDINGS Brain: Chronic white matter ischemic changes are noted. No findings to suggest acute hemorrhage, acute infarction or space-occupying mass  lesion are noted. Dense lesion is again identified within the sella stable from the prior exam. Vascular: No hyperdense vessel or unexpected calcification. Skull: Normal. Negative for fracture or focal lesion. Sinuses/Orbits: No acute finding. Other: None CT CERVICAL SPINE FINDINGS Alignment: Within normal limits. Skull base and vertebrae: 7 cervical segments are well visualized. Vertebral body height is well maintained. No acute fracture or acute facet abnormality is noted. Facet hypertrophic changes are seen. No significant neural foraminal narrowing is noted. Soft tissues and spinal canal: Surrounding soft tissues are unremarkable. Upper chest: Visualized upper lung fields are within normal limits. Other: None IMPRESSION: CT of the head: No change from the prior exam. No acute abnormality noted. CT of the cervical spine: Multilevel degenerative change without acute abnormality. Electronically Signed   By: Inez Catalina M.D.   On: 12/11/2018 22:16   Ct Cervical Spine Wo Contrast  Result Date: 12/11/2018 CLINICAL DATA:  Recent fall EXAM: CT HEAD WITHOUT CONTRAST CT CERVICAL SPINE WITHOUT CONTRAST TECHNIQUE: Multidetector CT imaging of the head and cervical spine was performed following the standard protocol without intravenous contrast. Multiplanar CT image reconstructions of the cervical spine were also generated. COMPARISON:  11/14/15 FINDINGS: CT HEAD FINDINGS Brain: Chronic white matter ischemic changes are noted. No findings to suggest acute hemorrhage, acute infarction or space-occupying mass lesion are noted. Dense lesion is again identified within the sella stable from the prior exam. Vascular: No hyperdense vessel or unexpected calcification. Skull: Normal. Negative for fracture or focal lesion. Sinuses/Orbits: No acute finding. Other: None CT CERVICAL SPINE FINDINGS Alignment: Within normal limits. Skull base and vertebrae: 7 cervical segments are well visualized. Vertebral body height is well  maintained. No acute fracture or acute facet abnormality is noted. Facet hypertrophic changes are seen. No significant neural foraminal narrowing is noted. Soft tissues and spinal canal: Surrounding soft tissues are unremarkable. Upper chest: Visualized upper lung fields are within normal limits. Other: None IMPRESSION: CT of the head: No change from the prior  exam. No acute abnormality noted. CT of the cervical spine: Multilevel degenerative change without acute abnormality. Electronically Signed   By: Inez Catalina M.D.   On: 12/11/2018 22:16   Dg Femur Min 2 Views Left  Result Date: 12/11/2018 CLINICAL DATA:  Recent fall with left leg pain, initial encounter EXAM: LEFT FEMUR 2 VIEWS COMPARISON:  None. FINDINGS: Comminuted left intratrochanteric femoral fracture is noted. Some impaction and angulation at the fracture site is noted. No other fracture is seen. IMPRESSION: Left intratrochanteric femoral fracture. Electronically Signed   By: Inez Catalina M.D.   On: 12/11/2018 22:29    EKG: Independently reviewed. Sinus rhythm.   Assessment/Plan   1. Left hip fracture  - Presents with severe left hip pain and deformity after a fall; intertrochanteric femur fracture noted on plain films  - Orthopedic surgery is consulting and much appreciated  - Based on the available data, Ms. Ordoyne presents an estimated 2% risk of perioperative MI or cardiac arrest per Melburn Hake al  - Continue pain-control, continue neurovascular checks, check CXR, give 5 mg oral vit K and repeat INR in am   2. History of PE  - No evidence for acute DVT or PE  - INR is 2.78 in ED  - She will be given 5 mg oral vit K and INR repeated in am    3. CAD  - No anginal complaints  - Hold ASA and ARB prior to surgery, continue statin   4. Hypertension  - BP elevated in ED, pain likely contributing  - Control pain, hold losartan prior to surgery, continue Norvasc and hydralazine   5. PMR  - Stable  - Continue daily low-dose  prednisone   6. Depression, anxiety, dementia  - Continue Aricept, doxepin, Remeron, and Xanax    7. CKD stage III  - SCr is 1.11 on admission, similar to priors  - Renally-dose medications    DVT prophylaxis: SCD's  Code Status: Full code for surgery  Family Communication: Discussed with patient  Consults called: Orthopedic surgery  Admission status: Inpatient     Vianne Bulls, MD [facility] Pager [phone]  If 7PM-7AM, please contact night-coverage [email or web address] Password [email or web address]  12/12/2018, 12:00 AM

## 2018-12-11 NOTE — ED Triage Notes (Signed)
Pt BIB [facility] from [facility]. Pt fell around 1600 today while trying to get out of a chair. Left side affected. Pt was experiencing left hip pain and and x-ray was ordered. Tramadol was doubled in the meantime. X-ray found a femur and hip fracture. Pt then sent here from facility. Pt has positive shortening and rotation. Pt only having pain with movement.

## 2018-12-11 NOTE — ED Notes (Signed)
Bed: KJ17 Expected date:  Expected time:  Means of arrival:  Comments: 85 yo F/ Hip fx SNF

## 2018-12-12 ENCOUNTER — Inpatient Hospital Stay: Payer: Self-pay

## 2018-12-12 ENCOUNTER — Inpatient Hospital Stay (HOSPITAL_COMMUNITY): Payer: Medicare Other

## 2018-12-12 ENCOUNTER — Encounter (HOSPITAL_COMMUNITY): Payer: Self-pay

## 2018-12-12 ENCOUNTER — Other Ambulatory Visit: Payer: Self-pay

## 2018-12-12 LAB — PROTIME-INR
INR: 2.92
INR: 2.46
Prothrombin Time: 30.1 seconds — ABNORMAL HIGH (ref 11.4–15.2)
Prothrombin Time: 26.3 seconds — ABNORMAL HIGH (ref 11.4–15.2)

## 2018-12-12 LAB — CBC
HCT: 37.1 % (ref 36.0–46.0)
Hemoglobin: 11 g/dL — ABNORMAL LOW (ref 12.0–15.0)
MCH: 25 pg — ABNORMAL LOW (ref 26.0–34.0)
MCHC: 29.6 g/dL — ABNORMAL LOW (ref 30.0–36.0)
MCV: 84.3 fL (ref 80.0–100.0)
Platelets: 310 10*3/uL (ref 150–400)
RBC: 4.4 MIL/uL (ref 3.87–5.11)
RDW: 15.8 % — AB (ref 11.5–15.5)
WBC: 13.2 10*3/uL — ABNORMAL HIGH (ref 4.0–10.5)
nRBC: 0 % (ref 0.0–0.2)

## 2018-12-12 LAB — BASIC METABOLIC PANEL
Anion gap: 9 (ref 5–15)
BUN: 18 mg/dL (ref 8–23)
CO2: 22 mmol/L (ref 22–32)
Calcium: 8.7 mg/dL — ABNORMAL LOW (ref 8.9–10.3)
Chloride: 104 mmol/L (ref 98–111)
Creatinine, Ser: 0.89 mg/dL (ref 0.44–1.00)
GFR calc Af Amer: >60 mL/min (ref >60)
GFR calc non Af Amer: 55 mL/min — ABNORMAL LOW (ref >60)
GLUCOSE: 132 mg/dL — AB (ref 70–99)
Potassium: 3.9 mmol/L (ref 3.5–5.1)
Sodium: 135 mmol/L (ref 135–145)

## 2018-12-12 LAB — MRSA PCR SCREENING: MRSA by PCR: NEGATIVE

## 2018-12-12 LAB — MAGNESIUM: Magnesium: 2.2 mg/dL (ref 1.7–2.4)

## 2018-12-12 MED ORDER — ALBUTEROL SULFATE (2.5 MG/3ML) 0.083% IN NEBU: 2.5000 mg | INHALATION_SOLUTION | Freq: Four times a day (QID) | RESPIRATORY_TRACT | Status: DC | PRN

## 2018-12-12 MED ORDER — CEFAZOLIN SODIUM-DEXTROSE 2-4 GM/100ML-% IV SOLN
2.0000 g | INTRAVENOUS | Status: AC
  Administered 2018-12-13: 2 g via INTRAVENOUS
  Filled 2018-12-12: qty 100

## 2018-12-12 MED ORDER — SODIUM CHLORIDE 0.9 % IV SOLN
INTRAVENOUS | Status: DC | PRN
  Administered 2018-12-12: 1000 mL via INTRAVENOUS

## 2018-12-12 MED ORDER — HYDRALAZINE HCL 20 MG/ML IJ SOLN: 10.0000 mg | INTRAMUSCULAR | Status: DC | PRN

## 2018-12-12 MED ORDER — POVIDONE-IODINE 10 % EX SWAB: 2.0000 "application " | CUTANEOUS | Status: DC

## 2018-12-12 MED ORDER — PHYTONADIONE 5 MG PO TABS
5.0000 mg | ORAL_TABLET | Freq: Once | ORAL | Status: AC
  Administered 2018-12-12: 5 mg via ORAL
  Filled 2018-12-12: qty 1

## 2018-12-12 MED ORDER — SODIUM CHLORIDE 0.9% FLUSH: 10.0000 mL | INTRAVENOUS | Status: DC | PRN

## 2018-12-12 MED ORDER — SODIUM CHLORIDE 0.9% FLUSH
10.0000 mL | Freq: Two times a day (BID) | INTRAVENOUS | Status: DC
  Administered 2018-12-12: 10 mL

## 2018-12-12 NOTE — Progress Notes (Signed)
Peripherally Inserted Central Catheter/Midline Placement  The IV Nurse has discussed with the patient and/or persons authorized to consent for the patient, the purpose of this procedure and the potential benefits and risks involved with this procedure.  The benefits include less needle sticks, lab draws from the catheter, and the patient may be discharged home with the catheter. Risks include, but not limited to, infection, bleeding, blood clot (thrombus formation), and puncture of an artery; nerve damage and irregular heartbeat and possibility to perform a PICC exchange if needed/ordered by physician.  Alternatives to this procedure were also discussed.  Bard Power PICC patient education guide, fact sheet on infection prevention and patient information card has been provided to patient /or left at bedside.    PICC/Midline Placement Documentation  PICC Double Lumen 12/12/18 PICC Right Brachial 33 cm 0 cm (Active)  Indication for Insertion or Continuance of Line Limited venous access - need for IV therapy >5 days (PICC only) 12/12/2018  3:14 PM  Exposed Catheter (cm) 0 cm 12/12/2018  3:14 PM  Site Assessment Clean;Dry;Intact 12/12/2018  3:14 PM  Lumen #1 Status Flushed;Blood return noted 12/12/2018  3:14 PM  Lumen #2 Status Flushed;Blood return noted 12/12/2018  3:14 PM  Dressing Type Transparent 12/12/2018  3:14 PM  Dressing Status Clean;Dry;Intact;Antimicrobial disc in place 12/12/2018  3:14 PM  Dressing Intervention New dressing 12/12/2018  3:14 PM  Dressing Change Due 12/19/18 12/12/2018  3:14 PM    POA son consent signed @ bedside   Synthia Innocent 12/12/2018, 3:15 PM

## 2018-12-12 NOTE — Progress Notes (Signed)
PROGRESS NOTE    [name] [name]  CHE:[id number] DOB: 05/02/1921 DOA: 12/11/2018 PCP: Hennie Duos, MD   Brief Narrative:  85 year old with history of anxiety/depression, coronary artery disease, polymyalgia rheumatica, DVT on Coumadin, dementia and essential hypertension came to the hospital from skilled nursing facility with complaints of left severe hip pain.  She was found to have left intertrochanteric femoral neck fracture.  Orthopedic was consulted.  She was given vitamin K due to elevated INR.   Assessment & Plan:   Principal Problem:   Closed left hip fracture, initial encounter ([facility]) Active Problems:   CAD (coronary artery disease)   HTN (hypertension)   PMR (polymyalgia rheumatica) (HCC)   History of pulmonary embolus (PE)   CKD (chronic kidney disease), stage III (HCC)   Depression with anxiety  Left intertrochanteric hip fracture - Patient will need ORIF once INR has trended down below 2.0.  She will get another dose of vitamin K 5 mg orally.  Repeat coags again. -Orthopedic following -Pain control, PT/OT thereafter. - Once cleared by orthopedic, Coumadin can be resumed at that point.  History of DVT/pulmonary embolism -No current evidence of this at this time.  INR is still elevated at 2.9.  Planning on getting another vitamin K today.  Closely monitor this.  Once surgery has been performed and cleared for surgery-resume Coumadin.  History of polymyalgia rheumatica -Currently stable on low-dose prednisone.  We will continue this for now.  May when needed dose of stress dose steroid prior to surgery.  Coronary artery disease -Currently home oral medications on hold.  Holding antihypertensives for now until the surgery.  Patient is currently chest pain-free.  Essential hypertension -Continue Norvasc and hydralazine.  Have also added IV as needed hydralazine as needed.  Blood pressure is reasonably well-controlled for now.  History of depression, anxiety and  dementia -Patient is a high risk of becoming delirious in the hospital.  She is aware of this.  Will use basic delirium precaution if necessary.  In the meantime continue her home medications including Aricept, doxepin, Remeron and Xanax.  Chronic kidney disease stage III -Renal function appears to be stable.  DVT prophylaxis: On Coumadin with high INR, currently on hold in anticipation for ORIF Code Status: Patient is DNR. Family Communication: None at bedside Disposition Plan: Maintain inpatient stay until the surgery  Consultants:   Orthopedic  Procedures:   None thus far  Antimicrobials:   None   Subjective: Patient reports of left hip pain with movement as expected.  Otherwise no other complaints at this time.  She is awaiting for surgery to be done.  Review of Systems Otherwise negative except as per HPI, including: General: Denies fever, chills, night sweats or unintended weight loss. Resp: Denies cough, wheezing, shortness of breath. Cardiac: Denies chest pain, palpitations, orthopnea, paroxysmal nocturnal dyspnea. GI: Denies abdominal pain, nausea, vomiting, diarrhea or constipation GU: Denies dysuria, frequency, hesitancy or incontinence MS: Denies muscle aches, joint pain or swelling Neuro: Denies headache, neurologic deficits (focal weakness, numbness, tingling), abnormal gait Psych: Denies anxiety, depression, SI/HI/AVH Skin: Denies new rashes or lesions ID: Denies sick contacts, exotic exposures, travel  Objective: Vitals:   12/12/18 0047 12/12/18 0543 12/12/18 0543 12/12/18 0600  BP: (!) 181/63 (!) 185/74 (!) 185/74 (!) 185/74  Pulse: 67 80 81 81  Resp: 16   16  Temp: 98.2 F (36.8 C) 98.2 F (36.8 C) 98.2 F (36.8 C) 98.2 F (36.8 C)  TempSrc: Oral Oral Oral Oral  SpO2:  96% 96% 98% 98%  Weight:      Height:        Intake/Output Summary (Last 24 hours) at 12/12/2018 1031 Last data filed at 12/12/2018 0600 Gross per 24 hour  Intake 49.41 ml    Output 200 ml  Net -150.59 ml   Filed Weights   12/11/18 2242  Weight: 57.2 kg    Examination:  General exam: Appears calm and comfortable, elderly frail-appearing. Respiratory system: Clear to auscultation. Respiratory effort normal. Cardiovascular system: S1 & S2 heard, RRR. No JVD, murmurs, rubs, gallops or clicks. No pedal edema. Gastrointestinal system: Abdomen is nondistended, soft and nontender. No organomegaly or masses felt. Normal bowel sounds heard. Central nervous system: Alert and oriented. No focal neurological deficits. Extremities: Symmetric 4 x 5 power.  Limited range of motion of her left lower extremity as expected due to pain. Skin: No rashes, lesions or ulcers Psychiatry: Judgement and insight appear normal. Mood & affect appropriate.     Data Reviewed:   CBC: Recent Labs  Lab 12/11/18 2308 12/12/18 0834  WBC 14.6* 13.2*  NEUTROABS 12.6*  --   HGB 11.0* 11.0*  HCT 36.4 37.1  MCV 83.7 84.3  PLT 311 878   Basic Metabolic Panel: Recent Labs  Lab 12/11/18 2308 12/12/18 0834  NA 137 135  K 4.2 3.9  CL 105 104  CO2 22 22  GLUCOSE 123* 132*  BUN 20 18  CREATININE 1.11* 0.89  CALCIUM 8.8* 8.7*  MG  --  2.2   GFR: Estimated Creatinine Clearance: 29.9 mL/min (by C-G formula based on SCr of 0.89 mg/dL). Liver Function Tests: No results for input(s): AST, ALT, ALKPHOS, BILITOT, PROT, ALBUMIN in the last 168 hours. No results for input(s): LIPASE, AMYLASE in the last 168 hours. No results for input(s): AMMONIA in the last 168 hours. Coagulation Profile: Recent Labs  Lab 12/11/18 2308 12/12/18 0834  INR 2.78 2.92   Cardiac Enzymes: No results for input(s): CKTOTAL, CKMB, CKMBINDEX, TROPONINI in the last 168 hours. BNP (last 3 results) No results for input(s): PROBNP in the last 8760 hours. HbA1C: No results for input(s): HGBA1C in the last 72 hours. CBG: No results for input(s): GLUCAP in the last 168 hours. Lipid Profile: No results  for input(s): CHOL, HDL, LDLCALC, TRIG, CHOLHDL, LDLDIRECT in the last 72 hours. Thyroid Function Tests: No results for input(s): TSH, T4TOTAL, FREET4, T3FREE, THYROIDAB in the last 72 hours. Anemia Panel: No results for input(s): VITAMINB12, FOLATE, FERRITIN, TIBC, IRON, RETICCTPCT in the last 72 hours. Sepsis Labs: No results for input(s): PROCALCITON, LATICACIDVEN in the last 168 hours.  No results found for this or any previous visit (from the past 240 hour(s)).       Radiology Studies: Dg Lumbar Spine Complete  Result Date: 12/11/2018 CLINICAL DATA:  Recent fall with low back pain EXAM: LUMBAR SPINE - COMPLETE 4+ VIEW COMPARISON:  03/08/2017 FINDINGS: Stable compression deformities of L2 and L3 are noted. Degenerative changes are noted worst at L4-5 facet hypertrophic changes are noted. No acute bony abnormality is seen. IMPRESSION: Chronic degenerative changes and compression fractures without acute abnormality. Electronically Signed   By: Inez Catalina M.D.   On: 12/11/2018 22:28   Dg Pelvis 1-2 Views  Result Date: 12/11/2018 CLINICAL DATA:  Recent fall with left leg pain, initial encounter EXAM: PELVIS - 1-2 VIEW COMPARISON:  None. FINDINGS: Pelvic ring is intact. Comminuted intratrochanteric left femoral fracture is noted with impaction and mild angulation at the fracture site. Femoral  head is well seated. IMPRESSION: Comminuted intratrochanteric left femoral fracture. Electronically Signed   By: Inez Catalina M.D.   On: 12/11/2018 22:27   Ct Head Wo Contrast  Result Date: 12/11/2018 CLINICAL DATA:  Recent fall EXAM: CT HEAD WITHOUT CONTRAST CT CERVICAL SPINE WITHOUT CONTRAST TECHNIQUE: Multidetector CT imaging of the head and cervical spine was performed following the standard protocol without intravenous contrast. Multiplanar CT image reconstructions of the cervical spine were also generated. COMPARISON:  11/14/15 FINDINGS: CT HEAD FINDINGS Brain: Chronic white matter ischemic  changes are noted. No findings to suggest acute hemorrhage, acute infarction or space-occupying mass lesion are noted. Dense lesion is again identified within the sella stable from the prior exam. Vascular: No hyperdense vessel or unexpected calcification. Skull: Normal. Negative for fracture or focal lesion. Sinuses/Orbits: No acute finding. Other: None CT CERVICAL SPINE FINDINGS Alignment: Within normal limits. Skull base and vertebrae: 7 cervical segments are well visualized. Vertebral body height is well maintained. No acute fracture or acute facet abnormality is noted. Facet hypertrophic changes are seen. No significant neural foraminal narrowing is noted. Soft tissues and spinal canal: Surrounding soft tissues are unremarkable. Upper chest: Visualized upper lung fields are within normal limits. Other: None IMPRESSION: CT of the head: No change from the prior exam. No acute abnormality noted. CT of the cervical spine: Multilevel degenerative change without acute abnormality. Electronically Signed   By: Inez Catalina M.D.   On: 12/11/2018 22:16   Ct Cervical Spine Wo Contrast  Result Date: 12/11/2018 CLINICAL DATA:  Recent fall EXAM: CT HEAD WITHOUT CONTRAST CT CERVICAL SPINE WITHOUT CONTRAST TECHNIQUE: Multidetector CT imaging of the head and cervical spine was performed following the standard protocol without intravenous contrast. Multiplanar CT image reconstructions of the cervical spine were also generated. COMPARISON:  11/14/15 FINDINGS: CT HEAD FINDINGS Brain: Chronic white matter ischemic changes are noted. No findings to suggest acute hemorrhage, acute infarction or space-occupying mass lesion are noted. Dense lesion is again identified within the sella stable from the prior exam. Vascular: No hyperdense vessel or unexpected calcification. Skull: Normal. Negative for fracture or focal lesion. Sinuses/Orbits: No acute finding. Other: None CT CERVICAL SPINE FINDINGS Alignment: Within normal limits. Skull  base and vertebrae: 7 cervical segments are well visualized. Vertebral body height is well maintained. No acute fracture or acute facet abnormality is noted. Facet hypertrophic changes are seen. No significant neural foraminal narrowing is noted. Soft tissues and spinal canal: Surrounding soft tissues are unremarkable. Upper chest: Visualized upper lung fields are within normal limits. Other: None IMPRESSION: CT of the head: No change from the prior exam. No acute abnormality noted. CT of the cervical spine: Multilevel degenerative change without acute abnormality. Electronically Signed   By: Inez Catalina M.D.   On: 12/11/2018 22:16   Chest Portable 1 View  Result Date: 12/12/2018 CLINICAL DATA:  Preop fractured hip EXAM: PORTABLE CHEST 1 VIEW COMPARISON:  03/11/2017 FINDINGS: Elevation of the right hemidiaphragm. No confluent airspace opacities or effusions. Heart is normal size. No acute bony abnormality. IMPRESSION: Stable chronic elevation of the right hemidiaphragm. No active disease. Electronically Signed   By: Rolm Baptise M.D.   On: 12/12/2018 00:30   Dg Femur Min 2 Views Left  Result Date: 12/11/2018 CLINICAL DATA:  Recent fall with left leg pain, initial encounter EXAM: LEFT FEMUR 2 VIEWS COMPARISON:  None. FINDINGS: Comminuted left intratrochanteric femoral fracture is noted. Some impaction and angulation at the fracture site is noted. No other fracture is seen.  IMPRESSION: Left intratrochanteric femoral fracture. Electronically Signed   By: Inez Catalina M.D.   On: 12/11/2018 22:29        Scheduled Meds: . ALPRAZolam  0.5 mg Oral Daily  . amLODipine  10 mg Oral q morning - 10a  . atorvastatin  10 mg Oral QPM  . donepezil  10 mg Oral QPM  . doxepin  6 mg Oral QHS  . hydrALAZINE  25 mg Oral Q8H  . mirtazapine  7.5 mg Oral QHS  . oxybutynin  5 mg Oral BID  . predniSONE  1 mg Oral Q breakfast  . rOPINIRole  1 mg Oral QHS   Continuous Infusions: . sodium chloride 1,000 mL (12/12/18  0103)     LOS: 1 day   Time spent= 35 mins    Ankit Arsenio Loader, MD [facility]  If 7PM-7AM, please contact night-coverage [email or web address] 12/12/2018, 10:31 AM

## 2018-12-12 NOTE — Progress Notes (Signed)
Initial Nutrition Assessment  INTERVENTION:   Diet advancement per MD Once diet is advanced: recommend Ensure Enlive po BID, each supplement provides 350 kcal and 20 grams of protein  NUTRITION DIAGNOSIS:   Increased nutrient needs related to post-op healing as evidenced by estimated needs.  GOAL:   Patient will meet greater than or equal to 90% of their needs  MONITOR:   Diet advancement, Weight trends, Labs, I & O's  REASON FOR ASSESSMENT:   Consult Hip fracture protocol  ASSESSMENT:   85 y.o. female with medical history significant for depression with anxiety, coronary artery disease, polymyalgia rheumatica, dementia, and hypertension, now presenting to the emergency department from her SNF for evaluation of severe left hip pain after a fall.   Patient sleeping in room with no family at bedside. Pt from SNF. Pt is currently NPO pending surgery either today or tomorrow. Once diet is advanced, pt would benefit from nutritional supplements. Recommend ensure supplements.  Per weight records, pt's weight has been stable over the past year.  Labs reviewed. Medications: Remeron tablet daily  NUTRITION - FOCUSED PHYSICAL EXAM:  Pt with advanced age.    Most Recent Value  Orbital Region  Mild depletion  Upper Arm Region  Mild depletion  Thoracic and Lumbar Region  Unable to assess  Buccal Region  Mild depletion  Temple Region  Mild depletion  Clavicle Bone Region  Mild depletion  Clavicle and Acromion Bone Region  Mild depletion  Scapular Bone Region  Unable to assess  Dorsal Hand  Unable to assess  Patellar Region  Unable to assess  Anterior Thigh Region  Unable to assess  Posterior Calf Region  Unable to assess  Edema (RD Assessment)  None       Diet Order:   Diet Order            Diet NPO time specified Except for: Sips with Meds  Diet effective midnight        Diet NPO time specified  Diet effective now              EDUCATION NEEDS:   Not  appropriate for education at this time  Skin:  Skin Assessment: Reviewed RN Assessment  Last BM:  PTA  Height:   Ht Readings from Last 1 Encounters:  12/11/18 5\' 2"  (1.575 m)    Weight:   Wt Readings from Last 1 Encounters:  12/11/18 57.2 kg    Ideal Body Weight:  50 kg  BMI:  Body mass index is 23.05 kg/m.  Estimated Nutritional Needs:   Kcal:  6010-9323  Protein:  70-80g  Fluid:  1.5L/day   Clayton Bibles, MS, RD, LDN [place] [facility] Pager: [phone] After Hours Pager: [phone]

## 2018-12-12 NOTE — ED Notes (Signed)
ED TO INPATIENT HANDOFF REPORT  Name/Age/Gender [name] [name] 85 y.o. female  Code Status    Code Status Orders  (From admission, onward)         Start     Ordered   12/11/18 2357  Full code  Continuous     12/11/18 2359        Code Status History    Date Active Date Inactive Code Status Order ID Comments User Context   03/12/2017 0018 03/16/2017 1807 Full Code [id number]  Lily Kocher, MD ED   07/21/2015 0015 07/22/2015 1827 Full Code [id number]  Domenic Polite, MD Inpatient   02/28/2014 1450 03/01/2014 2158 Partial Code [id number]  Elmarie Shiley, MD Inpatient   06/09/2012 1525 06/14/2012 1833 Full Code [id number]  Sanda Linger, RN Inpatient   02/03/2012 0053 02/04/2012 1821 Full Code [id number]  Gracy Bruins, RN Inpatient    Advance Directive Documentation     Most Recent Value  Type of Advance Directive  Out of facility DNR (pink MOST or yellow form)  Pre-existing out of facility DNR order (yellow form or pink MOST form)  Yellow form placed in chart (order not valid for inpatient use)  "MOST" Form in Place?  -      Home/SNF/Other Rehab  Chief Complaint fall  Level of Care/Admitting Diagnosis ED Disposition    ED Disposition Condition [place]: [place] [[id number]]  Level of Care: Med-Surg [16]  Diagnosis: Closed left hip fracture, initial encounter [facility]) [[id number]]  Admitting Physician: Vianne Bulls [[id number]]  Attending Physician: Vianne Bulls [[id number]]  Estimated length of stay: past midnight tomorrow  Certification:: I certify this patient will need inpatient services for at least 2 midnights  PT Class (Do Not Modify): Inpatient [101]  PT Acc Code (Do Not Modify): Private [1]       Medical History Past Medical History:  Diagnosis Date  . Anemia 03/02/2012  . Angina pectoris, unstable ([place]) 03/02/2012  . Anxiety   . Axillary abscess   . Bradycardia 06/13/2012  . CAD (coronary artery disease)    S/p  PTCA / stenting (last cath 2004, multiple LAD stents, 2 stents in the right coronary artery all patent)  . Cancer ([place])   . Complication of anesthesia   . Compression fracture of L2 ([place]) 03/12/2017  . Dementia ([place])   . Dysphagia 10/06/2012  . Dyspnea 02/03/2012  . GERD (gastroesophageal reflux disease)   . History of pulmonary embolus (PE) 03/19/2017  . HTN (hypertension)   . PMR (polymyalgia rheumatica) (HCC)   . PONV (postoperative nausea and vomiting)   . Restless leg syndrome   . 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 Started coumadin  . Weakness of both legs 06/09/2012    Allergies Allergies  Allergen Reactions  . Ambien [Zolpidem Tartrate] Nausea And Vomiting  . Codeine Phosphate Nausea And Vomiting  . Darvocet [Propoxyphene N-Acetaminophen] Nausea And Vomiting  . Fish-Derived Products   . Promethazine Hcl Nausea And Vomiting  . Sulfamethoxazole Other (See Comments)    Pt doesn't remember reaction  . Vicodin [Hydrocodone-Acetaminophen] Other (See Comments)    Unknown reaction  . Amoxicillin Nausea And Vomiting and Rash  . Penicillins Nausea And Vomiting and Rash    IV Location/Drains/Wounds Patient Lines/Drains/Airways Status   Active Line/Drains/Airways    Name:   Placement date:   Placement time:   Site:   Days:   Peripheral IV 12/11/18 Left;Upper Arm  12/11/18    2305    Arm   1   Incision 06/12/12 Axilla Left   06/12/12    1109     2374   Incision 10/06/12 Axilla Right   10/06/12    -     2258   Wound 10/09/12 Axilla Left   10/09/12    2038    Axilla   2255   Wound 10/10/12 Sacrum Right;Left blanchable reddness om bottom   10/10/12    2047    Sacrum   2254          Labs/Imaging Results for orders placed or performed during the hospital encounter of 12/11/18 (from the past 48 hour(s))  Basic metabolic panel     Status: Abnormal   Collection Time: 12/11/18 11:08 PM  Result Value Ref Range   Sodium 137 135 - 145 mmol/L   Potassium 4.2 3.5 - 5.1  mmol/L   Chloride 105 98 - 111 mmol/L   CO2 22 22 - 32 mmol/L   Glucose, Bld 123 (H) 70 - 99 mg/dL   BUN 20 8 - 23 mg/dL   Creatinine, Ser 1.11 (H) 0.44 - 1.00 mg/dL   Calcium 8.8 (L) 8.9 - 10.3 mg/dL   GFR calc non Af Amer 42 (L) >60 mL/min   GFR calc Af Amer 49 (L) >60 mL/min   Anion gap 10 5 - 15    Comment: Performed at [facility], [place] [address], [place], [place] [address]  CBC WITH DIFFERENTIAL     Status: Abnormal   Collection Time: 12/11/18 11:08 PM  Result Value Ref Range   WBC 14.6 (H) 4.0 - 10.5 K/uL   RBC 4.35 3.87 - 5.11 MIL/uL   Hemoglobin 11.0 (L) 12.0 - 15.0 g/dL   HCT 36.4 36.0 - 46.0 %   MCV 83.7 80.0 - 100.0 fL   MCH 25.3 (L) 26.0 - 34.0 pg   MCHC 30.2 30.0 - 36.0 g/dL   RDW 15.7 (H) 11.5 - 15.5 %   Platelets 311 150 - 400 K/uL   nRBC 0.0 0.0 - 0.2 %   Neutrophils Relative % 86 %   Neutro Abs 12.6 (H) 1.7 - 7.7 K/uL   Lymphocytes Relative 7 %   Lymphs Abs 1.0 0.7 - 4.0 K/uL   Monocytes Relative 6 %   Monocytes Absolute 0.8 0.1 - 1.0 K/uL   Eosinophils Relative 0 %   Eosinophils Absolute 0.0 0.0 - 0.5 K/uL   Basophils Relative 0 %   Basophils Absolute 0.0 0.0 - 0.1 K/uL   Immature Granulocytes 1 %   Abs Immature Granulocytes 0.08 (H) 0.00 - 0.07 K/uL    Comment: Performed at [facility], [place] [address], [place], [place] [address]  Protime-INR     Status: Abnormal   Collection Time: 12/11/18 11:08 PM  Result Value Ref Range   Prothrombin Time 29.0 (H) 11.4 - 15.2 seconds   INR 2.78     Comment: Performed at [facility], [place] [address], [place], [place] [address]  Type and screen [place]     Status: None   Collection Time: 12/11/18 11:08 PM  Result Value Ref Range   ABO/RH(D) A NEG    Antibody Screen POS    Sample Expiration      12/14/2018 Performed at [facility], [place] [address], [place],  [address]    Dg Lumbar Spine Complete  Result  Date: 12/11/2018 CLINICAL  DATA:  Recent fall with low back pain EXAM: LUMBAR SPINE - COMPLETE 4+ VIEW COMPARISON:  03/08/2017 FINDINGS: Stable compression deformities of L2 and L3 are noted. Degenerative changes are noted worst at L4-5 facet hypertrophic changes are noted. No acute bony abnormality is seen. IMPRESSION: Chronic degenerative changes and compression fractures without acute abnormality. Electronically Signed   By: Inez Catalina M.D.   On: 12/11/2018 22:28   Dg Pelvis 1-2 Views  Result Date: 12/11/2018 CLINICAL DATA:  Recent fall with left leg pain, initial encounter EXAM: PELVIS - 1-2 VIEW COMPARISON:  None. FINDINGS: Pelvic ring is intact. Comminuted intratrochanteric left femoral fracture is noted with impaction and mild angulation at the fracture site. Femoral head is well seated. IMPRESSION: Comminuted intratrochanteric left femoral fracture. Electronically Signed   By: Inez Catalina M.D.   On: 12/11/2018 22:27   Ct Head Wo Contrast  Result Date: 12/11/2018 CLINICAL DATA:  Recent fall EXAM: CT HEAD WITHOUT CONTRAST CT CERVICAL SPINE WITHOUT CONTRAST TECHNIQUE: Multidetector CT imaging of the head and cervical spine was performed following the standard protocol without intravenous contrast. Multiplanar CT image reconstructions of the cervical spine were also generated. COMPARISON:  11/14/15 FINDINGS: CT HEAD FINDINGS Brain: Chronic white matter ischemic changes are noted. No findings to suggest acute hemorrhage, acute infarction or space-occupying mass lesion are noted. Dense lesion is again identified within the sella stable from the prior exam. Vascular: No hyperdense vessel or unexpected calcification. Skull: Normal. Negative for fracture or focal lesion. Sinuses/Orbits: No acute finding. Other: None CT CERVICAL SPINE FINDINGS Alignment: Within normal limits. Skull base and vertebrae: 7 cervical segments are well visualized. Vertebral body height is well maintained. No acute fracture or acute  facet abnormality is noted. Facet hypertrophic changes are seen. No significant neural foraminal narrowing is noted. Soft tissues and spinal canal: Surrounding soft tissues are unremarkable. Upper chest: Visualized upper lung fields are within normal limits. Other: None IMPRESSION: CT of the head: No change from the prior exam. No acute abnormality noted. CT of the cervical spine: Multilevel degenerative change without acute abnormality. Electronically Signed   By: Inez Catalina M.D.   On: 12/11/2018 22:16   Ct Cervical Spine Wo Contrast  Result Date: 12/11/2018 CLINICAL DATA:  Recent fall EXAM: CT HEAD WITHOUT CONTRAST CT CERVICAL SPINE WITHOUT CONTRAST TECHNIQUE: Multidetector CT imaging of the head and cervical spine was performed following the standard protocol without intravenous contrast. Multiplanar CT image reconstructions of the cervical spine were also generated. COMPARISON:  11/14/15 FINDINGS: CT HEAD FINDINGS Brain: Chronic white matter ischemic changes are noted. No findings to suggest acute hemorrhage, acute infarction or space-occupying mass lesion are noted. Dense lesion is again identified within the sella stable from the prior exam. Vascular: No hyperdense vessel or unexpected calcification. Skull: Normal. Negative for fracture or focal lesion. Sinuses/Orbits: No acute finding. Other: None CT CERVICAL SPINE FINDINGS Alignment: Within normal limits. Skull base and vertebrae: 7 cervical segments are well visualized. Vertebral body height is well maintained. No acute fracture or acute facet abnormality is noted. Facet hypertrophic changes are seen. No significant neural foraminal narrowing is noted. Soft tissues and spinal canal: Surrounding soft tissues are unremarkable. Upper chest: Visualized upper lung fields are within normal limits. Other: None IMPRESSION: CT of the head: No change from the prior exam. No acute abnormality noted. CT of the cervical spine: Multilevel degenerative change without  acute abnormality. Electronically Signed   By: Inez Catalina M.D.   On: 12/11/2018 22:16  Dg Femur Min 2 Views Left  Result Date: 12/11/2018 CLINICAL DATA:  Recent fall with left leg pain, initial encounter EXAM: LEFT FEMUR 2 VIEWS COMPARISON:  None. FINDINGS: Comminuted left intratrochanteric femoral fracture is noted. Some impaction and angulation at the fracture site is noted. No other fracture is seen. IMPRESSION: Left intratrochanteric femoral fracture. Electronically Signed   By: Inez Catalina M.D.   On: 12/11/2018 22:29   EKG Interpretation  Date/Time:  Monday December 11 2018 22:41:53 EST Ventricular Rate:  60 PR Interval:    QRS Duration: 92 QT Interval:  443 QTC Calculation: 443 R Axis:   39 Text Interpretation:  Sinus rhythm Probable anteroseptal infarct, old no significant change since 2018 Confirmed by Sherwood Gambler [id number]) on 12/11/2018 10:59:29 PM   Pending Labs Unresulted Labs (From admission, onward)    Start     Ordered   12/12/18 0500  Protime-INR  Daily,   R     12/11/18 2359          Vitals/Pain Today's Vitals   12/11/18 2122 12/11/18 2242 12/11/18 2300 12/12/18 0000  BP: (!) 178/71  (!) 182/61 (!) 152/111  Pulse: 67  64 74  Resp: 14  17 (!) 28  Temp: 98.2 F (36.8 C)     TempSrc: Oral     SpO2: 97%  95% 95%  Weight:  57.2 kg    Height:  5\' 2"  (1.575 m)    PainSc:        Isolation Precautions No active isolations  Medications Medications  fentaNYL (SUBLIMAZE) injection 50 mcg (50 mcg Intravenous Given 12/11/18 2309)  amLODipine (NORVASC) tablet 10 mg (has no administration in time range)  atorvastatin (LIPITOR) tablet 10 mg (has no administration in time range)  hydrALAZINE (APRESOLINE) tablet 25 mg (has no administration in time range)  ALPRAZolam (XANAX) tablet 0.5 mg (has no administration in time range)  donepezil (ARICEPT) tablet 10 mg (has no administration in time range)  mirtazapine (REMERON) tablet 7.5 mg (has no administration in  time range)  Doxepin HCl TABS 6 mg (has no administration in time range)  predniSONE (DELTASONE) tablet 1 mg (has no administration in time range)  oxybutynin (DITROPAN) tablet 5 mg (has no administration in time range)  rOPINIRole (REQUIP) tablet 1 mg (has no administration in time range)  polyethylene glycol (MIRALAX / GLYCOLAX) packet 17 g (has no administration in time range)  bisacodyl (DULCOLAX) suppository 10 mg (has no administration in time range)  sodium phosphate (FLEET) 7-19 GM/118ML enema 1 enema (has no administration in time range)  fentaNYL (SUBLIMAZE) injection 25-50 mcg (has no administration in time range)  phytonadione (VITAMIN K) tablet 5 mg (has no administration in time range)  albuterol (PROVENTIL) (2.5 MG/3ML) 0.083% nebulizer solution 2.5 mg (has no administration in time range)    Mobility walks with device

## 2018-12-12 NOTE — Consult Note (Signed)
Reason for Consult: Left hip fracture Referring Physician: Reesa Chew, MD (Hospitalist)  [name] [name] is an 85 y.o. female.  HPI: [name] [name] is a 85 y.o. female with medical history significant for depression with anxiety, coronary artery disease, polymyalgia rheumatica, dementia, and hypertension, now presenting to the emergency department from her SNF for evaluation of severe left hip pain after a fall.  Patient reports that she was in her usual state of health when she fell onto her left side this evening and experienced immediate and severe pain at the left hip.  She denies any recent chest pain, cough, shortness of breath, fevers, or chills.  She does not remember exactly why she fell, but denies hitting her head or losing consciousness.  She denies any melena or hematochezia.  This am she reports pain in left hip and left lower back.  No upper extremity complaints   Past Medical History:  Diagnosis Date  . Anemia 03/02/2012  . Angina pectoris, unstable ([place]) 03/02/2012  . Anxiety   . Axillary abscess   . Bradycardia 06/13/2012  . CAD (coronary artery disease)    S/p PTCA / stenting (last cath 2004, multiple LAD stents, 2 stents in the right coronary artery all patent)  . Cancer ([place])   . Complication of anesthesia   . Compression fracture of L2 ([place]) 03/12/2017  . Dementia ([place])   . Dysphagia 10/06/2012  . Dyspnea 02/03/2012  . GERD (gastroesophageal reflux disease)   . History of pulmonary embolus (PE) 03/19/2017  . HTN (hypertension)   . PMR (polymyalgia rheumatica) (HCC)   . PONV (postoperative nausea and vomiting)   . Restless leg syndrome   . 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 Started coumadin  . Weakness of both legs 06/09/2012    Past Surgical History:  Procedure Laterality Date  . BREAST LUMPECTOMY    . COLECTOMY    . ESOPHAGOGASTRODUODENOSCOPY (EGD) WITH ESOPHAGEAL DILATION  10/10/2012   Procedure: ESOPHAGOGASTRODUODENOSCOPY (EGD) WITH ESOPHAGEAL  DILATION;  Surgeon: Inda Castle, MD;  Location: [facility];  Service: Endoscopy;  Laterality: N/A;  . heart stents  x 8  . INCISION AND DRAINAGE     bilateral axillary, non specific staff  . INCISION AND DRAINAGE ABSCESS  09/28/2012   Procedure: INCISION AND DRAINAGE ABSCESS;  Surgeon: Zenovia Jarred, MD;  Location: [place];  Service: General;  Laterality: Bilateral;  . stent     cardiac x 8 stents.    Family History  Problem Relation Age of Onset  . Heart failure Mother   . Stroke Father   . Breast cancer Sister   . Heart disease Sister   . Heart disease Brother     Social History:  reports that she has never smoked. She has never used smokeless tobacco. She reports that she does not drink alcohol or use drugs.  Allergies:  Allergies  Allergen Reactions  . Ambien [Zolpidem Tartrate] Nausea And Vomiting  . Codeine Phosphate Nausea And Vomiting  . Darvocet [Propoxyphene N-Acetaminophen] Nausea And Vomiting  . Fish-Derived Products   . Promethazine Hcl Nausea And Vomiting  . Sulfamethoxazole Other (See Comments)    Pt doesn't remember reaction  . Vicodin [Hydrocodone-Acetaminophen] Other (See Comments)    Unknown reaction  . Amoxicillin Nausea And Vomiting and Rash  . Penicillins Nausea And Vomiting and Rash    Medications:  I have reviewed the patient's current medications. Scheduled: . ALPRAZolam  0.5 mg Oral Daily  . amLODipine  10  mg Oral q morning - 10a  . atorvastatin  10 mg Oral QPM  . donepezil  10 mg Oral QPM  . doxepin  6 mg Oral QHS  . hydrALAZINE  25 mg Oral Q8H  . mirtazapine  7.5 mg Oral QHS  . oxybutynin  5 mg Oral BID  . predniSONE  1 mg Oral Q breakfast  . rOPINIRole  1 mg Oral QHS    Results for orders placed or performed during the hospital encounter of 12/11/18 (from the past 24 hour(s))  Basic metabolic panel     Status: Abnormal   Collection Time: 12/11/18 11:08 PM  Result Value Ref Range   Sodium 137 135 - 145 mmol/L   Potassium 4.2  3.5 - 5.1 mmol/L   Chloride 105 98 - 111 mmol/L   CO2 22 22 - 32 mmol/L   Glucose, Bld 123 (H) 70 - 99 mg/dL   BUN 20 8 - 23 mg/dL   Creatinine, Ser 1.11 (H) 0.44 - 1.00 mg/dL   Calcium 8.8 (L) 8.9 - 10.3 mg/dL   GFR calc non Af Amer 42 (L) >60 mL/min   GFR calc Af Amer 49 (L) >60 mL/min   Anion gap 10 5 - 15  CBC WITH DIFFERENTIAL     Status: Abnormal   Collection Time: 12/11/18 11:08 PM  Result Value Ref Range   WBC 14.6 (H) 4.0 - 10.5 K/uL   RBC 4.35 3.87 - 5.11 MIL/uL   Hemoglobin 11.0 (L) 12.0 - 15.0 g/dL   HCT 36.4 36.0 - 46.0 %   MCV 83.7 80.0 - 100.0 fL   MCH 25.3 (L) 26.0 - 34.0 pg   MCHC 30.2 30.0 - 36.0 g/dL   RDW 15.7 (H) 11.5 - 15.5 %   Platelets 311 150 - 400 K/uL   nRBC 0.0 0.0 - 0.2 %   Neutrophils Relative % 86 %   Neutro Abs 12.6 (H) 1.7 - 7.7 K/uL   Lymphocytes Relative 7 %   Lymphs Abs 1.0 0.7 - 4.0 K/uL   Monocytes Relative 6 %   Monocytes Absolute 0.8 0.1 - 1.0 K/uL   Eosinophils Relative 0 %   Eosinophils Absolute 0.0 0.0 - 0.5 K/uL   Basophils Relative 0 %   Basophils Absolute 0.0 0.0 - 0.1 K/uL   Immature Granulocytes 1 %   Abs Immature Granulocytes 0.08 (H) 0.00 - 0.07 K/uL  Protime-INR     Status: Abnormal   Collection Time: 12/11/18 11:08 PM  Result Value Ref Range   Prothrombin Time 29.0 (H) 11.4 - 15.2 seconds   INR 2.78   Type and screen [place]     Status: None   Collection Time: 12/11/18 11:08 PM  Result Value Ref Range   ABO/RH(D) A NEG    Antibody Screen POS    Sample Expiration 12/14/2018    Antibody Identification      ANTI D Performed at [facility], [address], [place], [place] [address]   Basic metabolic panel     Status: Abnormal   Collection Time: 12/12/18  8:34 AM  Result Value Ref Range   Sodium 135 135 - 145 mmol/L   Potassium 3.9 3.5 - 5.1 mmol/L   Chloride 104 98 - 111 mmol/L   CO2 22 22 - 32 mmol/L   Glucose, Bld 132 (H) 70 - 99 mg/dL   BUN 18 8 - 23 mg/dL    Creatinine, Ser 0.89 0.44 - 1.00 mg/dL  Calcium 8.7 (L) 8.9 - 10.3 mg/dL   GFR calc non Af Amer 55 (L) >60 mL/min   GFR calc Af Amer >60 >60 mL/min   Anion gap 9 5 - 15  CBC     Status: Abnormal   Collection Time: 12/12/18  8:34 AM  Result Value Ref Range   WBC 13.2 (H) 4.0 - 10.5 K/uL   RBC 4.40 3.87 - 5.11 MIL/uL   Hemoglobin 11.0 (L) 12.0 - 15.0 g/dL   HCT 37.1 36.0 - 46.0 %   MCV 84.3 80.0 - 100.0 fL   MCH 25.0 (L) 26.0 - 34.0 pg   MCHC 29.6 (L) 30.0 - 36.0 g/dL   RDW 15.8 (H) 11.5 - 15.5 %   Platelets 310 150 - 400 K/uL   nRBC 0.0 0.0 - 0.2 %  Magnesium     Status: None   Collection Time: 12/12/18  8:34 AM  Result Value Ref Range   Magnesium 2.2 1.7 - 2.4 mg/dL  Protime-INR     Status: Abnormal   Collection Time: 12/12/18  8:34 AM  Result Value Ref Range   Prothrombin Time 30.1 (H) 11.4 - 15.2 seconds   INR 2.92      X-ray: CLINICAL DATA:  Recent fall with left leg pain, initial encounter  EXAM: PELVIS - 1-2 VIEW  COMPARISON:  None.  FINDINGS: Pelvic ring is intact. Comminuted intratrochanteric left femoral fracture is noted with impaction and mild angulation at the fracture site. Femoral head is well seated.  IMPRESSION: Comminuted intratrochanteric left femoral fracture.  ROS: All other systems reviewed and apart from HPI, are negative.  Blood pressure (!) 185/74, pulse 81, temperature 98.2 F (36.8 C), temperature source Oral, resp. rate 16, height 5\' 2"  (1.575 m), weight 57.2 kg, SpO2 98 %.  Physical Exam  Awake alert LLE shortened and externally rotated, pain with any movement  No upper extremity deformity No right LE pain  General medical exam reviewed for pertinent findings  Assessment/Plan: Assessment: Closed comminuted left intertrochanteric hip fracture  Plan: Recommend ORIF of left hip - discussed now with patient and her son Issue with her INR last checked and close to 3 - this needs to be at least under 2 before proceeding with  surgery.  I plan to recheck one more time today before making decision about postponing until tomorrow NPO for now   Mauri Pole 12/12/2018, 10:02 AM

## 2018-12-12 NOTE — Progress Notes (Signed)
Made Dr.Amin aware of patients loss of IV access. Per IV team, they were unable to get an IV with ultrasound and did not see anything for a possible MIDLINE. Per Dr.Amin to have them place PICC as soon as possible. Patient is NPO and going for surgery at 1700.

## 2018-12-13 ENCOUNTER — Inpatient Hospital Stay (HOSPITAL_COMMUNITY): Payer: Medicare Other | Admitting: Certified Registered Nurse Anesthetist

## 2018-12-13 ENCOUNTER — Encounter (HOSPITAL_COMMUNITY)
Admission: AD | Disposition: A | Discharge status: Skilled Nursing Facility | Payer: Self-pay | Source: Home / Self Care | Attending: Family Medicine

## 2018-12-13 ENCOUNTER — Inpatient Hospital Stay (HOSPITAL_COMMUNITY): Payer: Medicare Other

## 2018-12-13 ENCOUNTER — Encounter (HOSPITAL_COMMUNITY): Payer: Self-pay | Admitting: Certified Registered Nurse Anesthetist

## 2018-12-13 HISTORY — PX: FEMUR IM NAIL: SHX1597

## 2018-12-13 LAB — BASIC METABOLIC PANEL WITH GFR
Anion gap: 9 (ref 5–15)
BUN: 20 mg/dL (ref 8–23)
CO2: 22 mmol/L (ref 22–32)
Calcium: 8.5 mg/dL — ABNORMAL LOW (ref 8.9–10.3)
Chloride: 108 mmol/L (ref 98–111)
Creatinine, Ser: 0.9 mg/dL (ref 0.44–1.00)
GFR calc Af Amer: >60 mL/min
GFR calc non Af Amer: 54 mL/min — ABNORMAL LOW
Glucose, Bld: 118 mg/dL — ABNORMAL HIGH (ref 70–99)
Potassium: 3.8 mmol/L (ref 3.5–5.1)
Sodium: 139 mmol/L (ref 135–145)

## 2018-12-13 LAB — MAGNESIUM: MAGNESIUM: 2.1 mg/dL (ref 1.7–2.4)

## 2018-12-13 LAB — PROTIME-INR
INR: 1.74
Prothrombin Time: 20.1 seconds — ABNORMAL HIGH (ref 11.4–15.2)

## 2018-12-13 LAB — CBC
HCT: 32.9 % — ABNORMAL LOW (ref 36.0–46.0)
Hemoglobin: 10 g/dL — ABNORMAL LOW (ref 12.0–15.0)
MCH: 25.3 pg — ABNORMAL LOW (ref 26.0–34.0)
MCHC: 30.4 g/dL (ref 30.0–36.0)
MCV: 83.1 fL (ref 80.0–100.0)
Platelets: 283 10*3/uL (ref 150–400)
RBC: 3.96 MIL/uL (ref 3.87–5.11)
RDW: 15.7 % — ABNORMAL HIGH (ref 11.5–15.5)
WBC: 13 10*3/uL — ABNORMAL HIGH (ref 4.0–10.5)
nRBC: 0 % (ref 0.0–0.2)

## 2018-12-13 LAB — TYPE AND SCREEN
ABO/RH(D): A NEG
Antibody Screen: POSITIVE

## 2018-12-13 SURGERY — INSERTION, INTRAMEDULLARY ROD, FEMUR
Anesthesia: General | Laterality: Left

## 2018-12-13 MED ORDER — METHOCARBAMOL 500 MG IVPB - SIMPLE MED
INTRAVENOUS | Status: AC
  Administered 2018-12-13: 500 mg via INTRAVENOUS
  Filled 2018-12-13: qty 50

## 2018-12-13 MED ORDER — ONDANSETRON HCL 4 MG/2ML IJ SOLN: 4.0000 mg | Freq: Once | INTRAMUSCULAR | Status: DC | PRN

## 2018-12-13 MED ORDER — DEXAMETHASONE SODIUM PHOSPHATE 10 MG/ML IJ SOLN
INTRAMUSCULAR | Status: AC
  Filled 2018-12-13: qty 1

## 2018-12-13 MED ORDER — EPHEDRINE 5 MG/ML INJ
INTRAVENOUS | Status: AC
  Filled 2018-12-13: qty 10

## 2018-12-13 MED ORDER — 0.9 % SODIUM CHLORIDE (POUR BTL) OPTIME
TOPICAL | Status: DC | PRN
  Administered 2018-12-13: 1000 mL

## 2018-12-13 MED ORDER — ONDANSETRON HCL 4 MG/2ML IJ SOLN
INTRAMUSCULAR | Status: DC | PRN
  Administered 2018-12-13: 4 mg via INTRAVENOUS

## 2018-12-13 MED ORDER — DOCUSATE SODIUM 100 MG PO CAPS
100.0000 mg | ORAL_CAPSULE | Freq: Two times a day (BID) | ORAL | Status: DC
  Administered 2018-12-13 – 2018-12-16 (×6): 100 mg via ORAL
  Filled 2018-12-13 (×6): qty 1

## 2018-12-13 MED ORDER — FENTANYL CITRATE (PF) 100 MCG/2ML IJ SOLN
INTRAMUSCULAR | Status: DC | PRN
  Administered 2018-12-13 (×2): 50 ug via INTRAVENOUS

## 2018-12-13 MED ORDER — LIDOCAINE 2% (20 MG/ML) 5 ML SYRINGE
INTRAMUSCULAR | Status: DC | PRN
  Administered 2018-12-13: 60 mg via INTRAVENOUS

## 2018-12-13 MED ORDER — METOCLOPRAMIDE HCL 5 MG/ML IJ SOLN: 5.0000 mg | Freq: Three times a day (TID) | INTRAMUSCULAR | Status: DC | PRN

## 2018-12-13 MED ORDER — METHOCARBAMOL 500 MG PO TABS
500.0000 mg | ORAL_TABLET | Freq: Four times a day (QID) | ORAL | Status: DC | PRN
  Administered 2018-12-13 – 2018-12-17 (×12): 500 mg via ORAL
  Filled 2018-12-13 (×12): qty 1

## 2018-12-13 MED ORDER — METOCLOPRAMIDE HCL 5 MG PO TABS: 5.0000 mg | ORAL_TABLET | Freq: Three times a day (TID) | ORAL | Status: DC | PRN

## 2018-12-13 MED ORDER — TRAMADOL HCL 50 MG PO TABS
50.0000 mg | ORAL_TABLET | Freq: Four times a day (QID) | ORAL | Status: DC
  Administered 2018-12-13 – 2018-12-18 (×19): 50 mg via ORAL
  Filled 2018-12-13 (×19): qty 1

## 2018-12-13 MED ORDER — ROCURONIUM BROMIDE 50 MG/5ML IV SOSY
PREFILLED_SYRINGE | INTRAVENOUS | Status: DC | PRN
  Administered 2018-12-13: 50 mg via INTRAVENOUS

## 2018-12-13 MED ORDER — PROPOFOL 10 MG/ML IV BOLUS
INTRAVENOUS | Status: DC | PRN
  Administered 2018-12-13: 100 mg via INTRAVENOUS

## 2018-12-13 MED ORDER — SUGAMMADEX SODIUM 200 MG/2ML IV SOLN
INTRAVENOUS | Status: DC | PRN
  Administered 2018-12-13: 200 mg via INTRAVENOUS

## 2018-12-13 MED ORDER — FENTANYL CITRATE (PF) 100 MCG/2ML IJ SOLN
INTRAMUSCULAR | Status: AC
  Filled 2018-12-13: qty 2

## 2018-12-13 MED ORDER — SODIUM CHLORIDE 0.9 % IV SOLN
INTRAVENOUS | Status: DC | PRN
  Administered 2018-12-13: 35 ug/min via INTRAVENOUS

## 2018-12-13 MED ORDER — CEFAZOLIN SODIUM-DEXTROSE 1-4 GM/50ML-% IV SOLN
1.0000 g | Freq: Four times a day (QID) | INTRAVENOUS | Status: AC
  Administered 2018-12-13 – 2018-12-14 (×2): 1 g via INTRAVENOUS
  Filled 2018-12-13 (×2): qty 50

## 2018-12-13 MED ORDER — WARFARIN SODIUM 4 MG PO TABS
4.0000 mg | ORAL_TABLET | Freq: Once | ORAL | Status: AC
  Administered 2018-12-13: 4 mg via ORAL
  Filled 2018-12-13 (×2): qty 1

## 2018-12-13 MED ORDER — SUCCINYLCHOLINE CHLORIDE 200 MG/10ML IV SOSY
PREFILLED_SYRINGE | INTRAVENOUS | Status: AC
  Filled 2018-12-13: qty 10

## 2018-12-13 MED ORDER — ESMOLOL HCL 100 MG/10ML IV SOLN
INTRAVENOUS | Status: AC
  Filled 2018-12-13: qty 20

## 2018-12-13 MED ORDER — SODIUM CHLORIDE 0.9 % IV SOLN
INTRAVENOUS | Status: DC
  Administered 2018-12-13: 16:00:00 via INTRAVENOUS

## 2018-12-13 MED ORDER — HYDROMORPHONE HCL 1 MG/ML IJ SOLN: 0.2500 mg | INTRAMUSCULAR | Status: DC | PRN

## 2018-12-13 MED ORDER — ONDANSETRON HCL 4 MG PO TABS: 4.0000 mg | ORAL_TABLET | Freq: Four times a day (QID) | ORAL | Status: DC | PRN

## 2018-12-13 MED ORDER — MENTHOL 3 MG MT LOZG: 1.0000 | LOZENGE | OROMUCOSAL | Status: DC | PRN

## 2018-12-13 MED ORDER — ONDANSETRON HCL 4 MG/2ML IJ SOLN: 4.0000 mg | Freq: Four times a day (QID) | INTRAMUSCULAR | Status: DC | PRN

## 2018-12-13 MED ORDER — PHENYLEPHRINE HCL-NACL 10-0.9 MG/250ML-% IV SOLN
INTRAVENOUS | Status: AC
  Filled 2018-12-13: qty 250

## 2018-12-13 MED ORDER — ROCURONIUM BROMIDE 100 MG/10ML IV SOLN
INTRAVENOUS | Status: AC
  Filled 2018-12-13: qty 1

## 2018-12-13 MED ORDER — POLYETHYLENE GLYCOL 3350 17 G PO PACK
17.0000 g | PACK | Freq: Every day | ORAL | Status: DC | PRN
  Administered 2018-12-14 – 2018-12-17 (×3): 17 g via ORAL
  Filled 2018-12-13 (×3): qty 1

## 2018-12-13 MED ORDER — MORPHINE SULFATE (PF) 2 MG/ML IV SOLN: 0.5000 mg | INTRAVENOUS | Status: DC | PRN

## 2018-12-13 MED ORDER — ONDANSETRON HCL 4 MG/2ML IJ SOLN
INTRAMUSCULAR | Status: AC
  Filled 2018-12-13: qty 2

## 2018-12-13 MED ORDER — EPHEDRINE SULFATE-NACL 50-0.9 MG/10ML-% IV SOSY
PREFILLED_SYRINGE | INTRAVENOUS | Status: DC | PRN
  Administered 2018-12-13 (×2): 10 mg via INTRAVENOUS

## 2018-12-13 MED ORDER — WARFARIN - PHARMACIST DOSING INPATIENT
Freq: Every day | Status: DC
  Administered 2018-12-14: 19:00:00

## 2018-12-13 MED ORDER — METHOCARBAMOL 500 MG IVPB - SIMPLE MED
500.0000 mg | Freq: Four times a day (QID) | INTRAVENOUS | Status: DC | PRN
  Administered 2018-12-13: 500 mg via INTRAVENOUS
  Filled 2018-12-13: qty 50

## 2018-12-13 MED ORDER — DEXAMETHASONE SODIUM PHOSPHATE 10 MG/ML IJ SOLN
INTRAMUSCULAR | Status: DC | PRN
  Administered 2018-12-13: 10 mg via INTRAVENOUS

## 2018-12-13 MED ORDER — MEPERIDINE HCL 50 MG/ML IJ SOLN: 6.2500 mg | INTRAMUSCULAR | Status: DC | PRN

## 2018-12-13 MED ORDER — LIDOCAINE 2% (20 MG/ML) 5 ML SYRINGE
INTRAMUSCULAR | Status: AC
  Filled 2018-12-13: qty 5

## 2018-12-13 MED ORDER — PHENOL 1.4 % MT LIQD
1.0000 | OROMUCOSAL | Status: DC | PRN
  Filled 2018-12-13: qty 177

## 2018-12-13 MED ORDER — SUGAMMADEX SODIUM 200 MG/2ML IV SOLN
INTRAVENOUS | Status: AC
  Filled 2018-12-13: qty 2

## 2018-12-13 MED ORDER — ACETAMINOPHEN 325 MG PO TABS
325.0000 mg | ORAL_TABLET | Freq: Four times a day (QID) | ORAL | Status: DC | PRN
  Administered 2018-12-15 – 2018-12-18 (×7): 650 mg via ORAL
  Filled 2018-12-13 (×6): qty 2

## 2018-12-13 MED ORDER — PROPOFOL 10 MG/ML IV BOLUS
INTRAVENOUS | Status: AC
  Filled 2018-12-13: qty 20

## 2018-12-13 MED ORDER — LACTATED RINGERS IV SOLN
INTRAVENOUS | Status: DC
  Administered 2018-12-13: 12:00:00 via INTRAVENOUS

## 2018-12-13 SURGICAL SUPPLY — 38 items
ADH SKN CLS APL DERMABOND .7 (GAUZE/BANDAGES/DRESSINGS) ×1
BAG SPEC THK2 15X12 ZIP CLS (MISCELLANEOUS)
BAG ZIPLOCK 12X15 (MISCELLANEOUS) IMPLANT
BIT DRILL CANN LG 4.3MM (BIT) IMPLANT
BNDG GAUZE ELAST 4 BULKY (GAUZE/BANDAGES/DRESSINGS) ×2 IMPLANT
COVER PERINEAL POST (MISCELLANEOUS) ×2 IMPLANT
COVER SURGICAL LIGHT HANDLE (MISCELLANEOUS) ×2 IMPLANT
COVER WAND RF STERILE (DRAPES) IMPLANT
DERMABOND ADVANCED (GAUZE/BANDAGES/DRESSINGS) ×1
DERMABOND ADVANCED .7 DNX12 (GAUZE/BANDAGES/DRESSINGS) IMPLANT
DRAPE STERI IOBAN 125X83 (DRAPES) ×2 IMPLANT
DRILL BIT CANN LG 4.3MM (BIT) ×2
DRSG AQUACEL AG ADV 3.5X 4 (GAUZE/BANDAGES/DRESSINGS) IMPLANT
DRSG AQUACEL AG ADV 3.5X 6 (GAUZE/BANDAGES/DRESSINGS) ×1 IMPLANT
DURAPREP 26ML APPLICATOR (WOUND CARE) ×2 IMPLANT
ELECT REM PT RETURN 15FT ADLT (MISCELLANEOUS) ×2 IMPLANT
GLOVE BIOGEL PI IND STRL 7.5 (GLOVE) ×1 IMPLANT
GLOVE BIOGEL PI IND STRL 8.5 (GLOVE) ×1 IMPLANT
GLOVE BIOGEL PI INDICATOR 7.5 (GLOVE) ×1
GLOVE BIOGEL PI INDICATOR 8.5 (GLOVE) ×1
GLOVE ECLIPSE 8.0 STRL XLNG CF (GLOVE) ×2 IMPLANT
GLOVE ORTHO TXT STRL SZ7.5 (GLOVE) ×2 IMPLANT
GOWN STRL REUS W/TWL LRG LVL3 (GOWN DISPOSABLE) ×2 IMPLANT
GOWN STRL REUS W/TWL XL LVL3 (GOWN DISPOSABLE) ×2 IMPLANT
GUIDEPIN 3.2X17.5 THRD DISP (PIN) ×1 IMPLANT
KIT BASIN OR (CUSTOM PROCEDURE TRAY) ×2 IMPLANT
MANIFOLD NEPTUNE II (INSTRUMENTS) ×2 IMPLANT
NAIL HIP FRACT 130D 11X180 (Screw) ×1 IMPLANT
PACK GENERAL/GYN (CUSTOM PROCEDURE TRAY) ×2 IMPLANT
PROTECTOR NERVE ULNAR (MISCELLANEOUS) ×3 IMPLANT
SCREW BONE CORTICAL 5.0X3 (Screw) ×1 IMPLANT
SCREW LAG HIP NAIL 10.5X95 (Screw) ×1 IMPLANT
SUT MNCRL AB 4-0 PS2 18 (SUTURE) ×1 IMPLANT
SUT VIC AB 1 CT1 27 (SUTURE) ×2
SUT VIC AB 1 CT1 27XBRD ANTBC (SUTURE) ×1 IMPLANT
SUT VIC AB 2-0 CT1 27 (SUTURE) ×2
SUT VIC AB 2-0 CT1 27XBRD (SUTURE) ×1 IMPLANT
TRAY FOLEY CATH 14FRSI W/METER (CATHETERS) ×1 IMPLANT

## 2018-12-13 NOTE — Brief Op Note (Signed)
12/11/2018 - 12/13/2018  12:50 PM  PATIENT:  [name] [name]  85 y.o. female  PRE-OPERATIVE DIAGNOSIS:  Left displaced intertrochanteric hip fracture   POST-OPERATIVE DIAGNOSIS:   Left displaced intertrochanteric hip fracture   PROCEDURE:  Procedure(s): INTRAMEDULLARY (IM) NAIL FEMORAL LEFT (Left)  SURGEON:  Surgeon(s) and Role:    Paralee Cancel, MD - Primary  PHYSICIAN ASSISTANT: Griffith Citron, PA-C  ANESTHESIA:   general  EBL:  <100 cc   BLOOD ADMINISTERED:none  DRAINS: none   LOCAL MEDICATIONS USED:  NONE  SPECIMEN:  No Specimen  DISPOSITION OF SPECIMEN:  N/A  COUNTS:  YES  TOURNIQUET:  * No tourniquets in log *  DICTATION: .Other Dictation: Dictation Number [id number]  PLAN OF CARE: Admit to inpatient   PATIENT DISPOSITION:  PACU - hemodynamically stable.   Delay start of Pharmacological VTE agent (>24hrs) due to surgical blood loss or risk of bleeding: no

## 2018-12-13 NOTE — Op Note (Signed)
NAME: [name] [name], [name] [name] MEDICAL RECORD RK:[id number] ACCOUNT [id number] DATE OF BIRTH:December 09, 1920 FACILITY: [facility] LOCATION: [facility] PHYSICIAN:Lizvette Lightsey [name] Sorrow, MD  OPERATIVE REPORT  DATE OF PROCEDURE:  12/13/2018  PREOPERATIVE DIAGNOSIS:  Comminuted left intertrochanteric femur fracture.  POSTOPERATIVE DIAGNOSIS:  Comminuted left intertrochanteric femur fracture.  PROCEDURE:  Open reduction internal fixation of left intertrochanteric femur fracture utilizing a Biomet Affixus nail, 11 x 180 mm 130 degree lag screw and a distal interlock.  SURGEON:  Paralee Cancel, MD  ASSISTANT:  Griffith Citron PA-C. Note that [name] was present for the procedure and facilitation of the case and primary wound closure.  ANESTHESIA:  General.  ESTIMATED BLOOD LOSS:  Less  than 100 mL.  COMPLICATIONS:  None.  INDICATIONS:  The patient is a 85 year old female with medical comorbidities who had a fall at her nursing facility.  She had immediate onset of pain and inability to bear weight.  Slight deformity.  [facility] where  radiographs revealed a displaced intertrochanteric fracture.  She is admitted to the hospitalist service per protocol.    She was noted to be on Coumadin for previous cardiac issues.  Her INR was elevated.  I was unable to get her INR decreased to a 2.0 level.  Yesterday, her case was canceled and scheduled for today.  This morning, her INR was noted to be 1.75,  I felt  that she was deemed safe to proceed with surgery with minimal risk of increased bleeding.  Risks and benefits of the procedure have been discussed and r reviewed with both the patient and her son who is her power of attorney.  Consent was obtained for  fracture management pain relief and hope for improved function.  Standard risks of infection, DVT, malunion, nonunion, need for future surgery reviewed and discussed.  Consent obtained.  DESCRIPTION OF PROCEDURE:  The patient was  brought to the operative theater.  Once adequate anesthesia, preoperative antibiotics, Ancef administered.  She was positioned supine on the Hana table.  All bony prominences were carefully positioned and  padded.  Her right lower extremity was flexed and abducted out of the way with bony prominences padded, particularly over the peroneal nerve.  Her left foot was placed in a traction boot.  Once positioned appropriately, traction and internal rotation was  applied.  Fluoroscopy was used to confirm a near anatomic reduction.  Once this was done, the left hip was prepped and draped in sterile fashion.  A timeout was performed identifying the patient, the planned procedure and extremity.  Fluoroscopy was  brought back to the field.  The tip of the trochanter was identified under fluoroscopy.  An incision was made proximal to this laterally.  Soft tissue dissection was carried down to the gluteal fascia, which was incised.  The fascia of the guidewire was  then inserted into the tip of the trochanter, confirmed radiographically in AP and lateral planes across the fracture.  The proximal femur was then drilled open for insertion of the nail.  The 11 x 180 mm nail was then passed on the insertion jig to its  appropriate depth.  Using the insertion jig and a guide, a guidewire was positioned into the center of the femoral head in AP and lateral planes.  I measured and selected a 95 mm lag screw.  I drilled for this.  I then placed a lag screw.  Traction was  then let off the lower extremity and I used the compression wheel  to medialize the shaft to the fracture.  I then locked the nail down proximally using the locking bolt and backed it off a quarter turn to allow for compression.  Once this was done, I  placed a distal interlock.  Final radiographs were obtained.  The wounds were irrigated with normal saline solution.  The 2 distal wounds were closed with 2-0 Vicryl followed by cleansing and Dermabond.  The  proximal wound was closed in layers with #1  Vicryl and the gluteal fascia followed by 2-0 Vicryl and Monocryl.  The wounds were cleaned, dried and dressed sterilely using surgical glue and Aquacel dressing.    She was then brought to the recovery room, tolerated the procedure well.  Findings were reviewed with her son and power of attorney.  AN/NUANCE  D:12/13/2018 T:12/13/2018 JOB:[id number]

## 2018-12-13 NOTE — Progress Notes (Signed)
PROGRESS NOTE    [name] [name]  YME:[id number] DOB: 04/24/1921 DOA: 12/11/2018 PCP: Hennie Duos, MD   Brief Narrative:  85 year old  anxiety/depression,  coronary artery disease,  polymyalgia rheumatica,  DVT on Coumadin, dementia and essential hypertension   came to the hospital from skilled nursing facility with complaints of left severe hip pain on 2/10  She was found to have left intertrochanteric femoral neck fracture.  Orthopedic was consulted.  She was given vitamin K due to elevated INR.   Assessment & Plan:   Principal Problem:   Closed left hip fracture, initial encounter ([place]) Active Problems:   CAD (coronary artery disease)   HTN (hypertension)   PMR (polymyalgia rheumatica) (HCC)   History of pulmonary embolus (PE)   CKD (chronic kidney disease), stage III (HCC)   Depression with anxiety  Left intertrochanteric hip fracture -Orthopedic following and performed surgery 2/12 -Pain control, PT/OT thereafter. - cleared by orthopedic, Coumadin resumed  History of DVT/pulmonary embolism -No current evidence of this at this time  History of polymyalgia rheumatica -Currently stable on low-dose prednisone.  We will continue this for now.   Coronary artery disease -Currently home oral medications on hold.  Holding antihypertensives for now until the surgery.  Patient is currently chest pain-free.  Essential hypertension -Continue Norvasc and hydralazine.  Have also added IV as needed hydralazine as needed.  Blood pressure is reasonably well-controlled for now.  History of depression, anxiety and dementia -Patient is a high risk of becoming delirious in the hospital.  She is aware of this.  Will use basic delirium precaution if necessary.  In the meantime continue her home medications including Aricept, doxepin, Remeron and Xanax.  Chronic kidney disease stage III -Renal function appears to be stable.  DVT prophylaxis: coumadin at this time Code Status:  Patient is DNR. Family Communication:  Disposition Plan: Maintain inpatient stay until the surgery  Consultants:   Orthopedic  Procedures:   None thus far  Antimicrobials:   None   Subjective:  Just back from surgery  In nad Hungry Slight HOH Review of Systems Otherwise negative except as per HPI, including: General: Denies fever, chills, night sweats or unintended weight loss.  Objective: Vitals:   12/13/18 1451 12/13/18 1500 12/13/18 1515 12/13/18 1539  BP: (!) 153/67 (!) 151/80 (!) 137/54 138/60  Pulse: 81 85 73 72  Resp: (!) 21 20 15 16   Temp:   97.9 F (36.6 C) 97.9 F (36.6 C)  TempSrc:      SpO2: 99% 96% 97% 99%  Weight:      Height:        Intake/Output Summary (Last 24 hours) at 12/13/2018 1621 Last data filed at 12/13/2018 1515 Gross per 24 hour  Intake 1210 ml  Output 780 ml  Net 430 ml   Filed Weights   12/11/18 2242  Weight: 57.2 kg    Examination:  Awake alert looks about stated age ccta b no adde dsond s1 s 2no m abd soft nt nd no rebound no guard LE not swollen--incision and bandage not disturbed Neuro power intact Sensory intact smile symm Slight forgetfullness    Data Reviewed:   CBC: Recent Labs  Lab 12/11/18 2308 12/12/18 0834 12/13/18 0316  WBC 14.6* 13.2* 13.0*  NEUTROABS 12.6*  --   --   HGB 11.0* 11.0* 10.0*  HCT 36.4 37.1 32.9*  MCV 83.7 84.3 83.1  PLT 311 310 680   Basic Metabolic Panel: Recent Labs  Lab 12/11/18 2308  12/12/18 0834 12/13/18 0316  NA 137 135 139  K 4.2 3.9 3.8  CL 105 104 108  CO2 22 22 22   GLUCOSE 123* 132* 118*  BUN 20 18 20   CREATININE 1.11* 0.89 0.90  CALCIUM 8.8* 8.7* 8.5*  MG  --  2.2 2.1   GFR: Estimated Creatinine Clearance: 29.6 mL/min (by C-G formula based on SCr of 0.9 mg/dL). Liver Function Tests: No results for input(s): AST, ALT, ALKPHOS, BILITOT, PROT, ALBUMIN in the last 168 hours. No results for input(s): LIPASE, AMYLASE in the last 168 hours. No results for  input(s): AMMONIA in the last 168 hours. Coagulation Profile: Recent Labs  Lab 12/11/18 2308 12/12/18 0834 12/12/18 1257 12/13/18 0316  INR 2.78 2.92 2.46 1.74   Cardiac Enzymes: No results for input(s): CKTOTAL, CKMB, CKMBINDEX, TROPONINI in the last 168 hours. BNP (last 3 results) No results for input(s): PROBNP in the last 8760 hours. HbA1C: No results for input(s): HGBA1C in the last 72 hours. CBG: No results for input(s): GLUCAP in the last 168 hours. Lipid Profile: No results for input(s): CHOL, HDL, LDLCALC, TRIG, CHOLHDL, LDLDIRECT in the last 72 hours. Thyroid Function Tests: No results for input(s): TSH, T4TOTAL, FREET4, T3FREE, THYROIDAB in the last 72 hours. Anemia Panel: No results for input(s): VITAMINB12, FOLATE, FERRITIN, TIBC, IRON, RETICCTPCT in the last 72 hours. Sepsis Labs: No results for input(s): PROCALCITON, LATICACIDVEN in the last 168 hours.  Recent Results (from the past 240 hour(s))  MRSA PCR Screening     Status: None   Collection Time: 12/12/18 11:14 AM  Result Value Ref Range Status   MRSA by PCR NEGATIVE NEGATIVE Final    Comment:        The GeneXpert MRSA Assay (FDA approved for NASAL specimens only), is one component of a comprehensive MRSA colonization surveillance program. It is not intended to diagnose MRSA infection nor to guide or monitor treatment for MRSA infections. Performed at [facility], [place] [address], [place], [place] [address]          Radiology Studies: Dg Lumbar Spine Complete  Result Date: 12/11/2018 CLINICAL DATA:  Recent fall with low back pain EXAM: LUMBAR SPINE - COMPLETE 4+ VIEW COMPARISON:  03/08/2017 FINDINGS: Stable compression deformities of L2 and L3 are noted. Degenerative changes are noted worst at L4-5 facet hypertrophic changes are noted. No acute bony abnormality is seen. IMPRESSION: Chronic degenerative changes and compression fractures without acute abnormality. Electronically  Signed   By: Inez Catalina M.D.   On: 12/11/2018 22:28   Dg Pelvis 1-2 Views  Result Date: 12/11/2018 CLINICAL DATA:  Recent fall with left leg pain, initial encounter EXAM: PELVIS - 1-2 VIEW COMPARISON:  None. FINDINGS: Pelvic ring is intact. Comminuted intratrochanteric left femoral fracture is noted with impaction and mild angulation at the fracture site. Femoral head is well seated. IMPRESSION: Comminuted intratrochanteric left femoral fracture. Electronically Signed   By: Inez Catalina M.D.   On: 12/11/2018 22:27   Ct Head Wo Contrast  Result Date: 12/11/2018 CLINICAL DATA:  Recent fall EXAM: CT HEAD WITHOUT CONTRAST CT CERVICAL SPINE WITHOUT CONTRAST TECHNIQUE: Multidetector CT imaging of the head and cervical spine was performed following the standard protocol without intravenous contrast. Multiplanar CT image reconstructions of the cervical spine were also generated. COMPARISON:  11/14/15 FINDINGS: CT HEAD FINDINGS Brain: Chronic white matter ischemic changes are noted. No findings to suggest acute hemorrhage, acute infarction or space-occupying mass lesion are noted. Dense lesion is again identified  within the sella stable from the prior exam. Vascular: No hyperdense vessel or unexpected calcification. Skull: Normal. Negative for fracture or focal lesion. Sinuses/Orbits: No acute finding. Other: None CT CERVICAL SPINE FINDINGS Alignment: Within normal limits. Skull base and vertebrae: 7 cervical segments are well visualized. Vertebral body height is well maintained. No acute fracture or acute facet abnormality is noted. Facet hypertrophic changes are seen. No significant neural foraminal narrowing is noted. Soft tissues and spinal canal: Surrounding soft tissues are unremarkable. Upper chest: Visualized upper lung fields are within normal limits. Other: None IMPRESSION: CT of the head: No change from the prior exam. No acute abnormality noted. CT of the cervical spine: Multilevel degenerative change  without acute abnormality. Electronically Signed   By: Inez Catalina M.D.   On: 12/11/2018 22:16   Ct Cervical Spine Wo Contrast  Result Date: 12/11/2018 CLINICAL DATA:  Recent fall EXAM: CT HEAD WITHOUT CONTRAST CT CERVICAL SPINE WITHOUT CONTRAST TECHNIQUE: Multidetector CT imaging of the head and cervical spine was performed following the standard protocol without intravenous contrast. Multiplanar CT image reconstructions of the cervical spine were also generated. COMPARISON:  11/14/15 FINDINGS: CT HEAD FINDINGS Brain: Chronic white matter ischemic changes are noted. No findings to suggest acute hemorrhage, acute infarction or space-occupying mass lesion are noted. Dense lesion is again identified within the sella stable from the prior exam. Vascular: No hyperdense vessel or unexpected calcification. Skull: Normal. Negative for fracture or focal lesion. Sinuses/Orbits: No acute finding. Other: None CT CERVICAL SPINE FINDINGS Alignment: Within normal limits. Skull base and vertebrae: 7 cervical segments are well visualized. Vertebral body height is well maintained. No acute fracture or acute facet abnormality is noted. Facet hypertrophic changes are seen. No significant neural foraminal narrowing is noted. Soft tissues and spinal canal: Surrounding soft tissues are unremarkable. Upper chest: Visualized upper lung fields are within normal limits. Other: None IMPRESSION: CT of the head: No change from the prior exam. No acute abnormality noted. CT of the cervical spine: Multilevel degenerative change without acute abnormality. Electronically Signed   By: Inez Catalina M.D.   On: 12/11/2018 22:16   Chest Portable 1 View  Result Date: 12/12/2018 CLINICAL DATA:  Preop fractured hip EXAM: PORTABLE CHEST 1 VIEW COMPARISON:  03/11/2017 FINDINGS: Elevation of the right hemidiaphragm. No confluent airspace opacities or effusions. Heart is normal size. No acute bony abnormality. IMPRESSION: Stable chronic elevation of  the right hemidiaphragm. No active disease. Electronically Signed   By: Rolm Baptise M.D.   On: 12/12/2018 00:30   Dg C-arm 1-60 Min-no Report  Result Date: 12/13/2018 Fluoroscopy was utilized by the requesting physician.  No radiographic interpretation.   Dg Femur Min 2 Views Left  Result Date: 12/11/2018 CLINICAL DATA:  Recent fall with left leg pain, initial encounter EXAM: LEFT FEMUR 2 VIEWS COMPARISON:  None. FINDINGS: Comminuted left intratrochanteric femoral fracture is noted. Some impaction and angulation at the fracture site is noted. No other fracture is seen. IMPRESSION: Left intratrochanteric femoral fracture. Electronically Signed   By: Inez Catalina M.D.   On: 12/11/2018 22:29   Dg Femur, Min 2 Views Right  Result Date: 12/13/2018 CLINICAL DATA:  Status post surgical fixation of proximal left femoral fracture. EXAM: RIGHT FEMUR 2 VIEWS Radiation exposure index: 6.5328 mGy. COMPARISON:  Radiograph of December 11, 2018. FINDINGS: Four intraoperative fluoroscopic images demonstrate surgical internal fixation of proximal left femoral inter trochanteric fracture. Good alignment of fracture components is noted. IMPRESSION: Status post surgical internal fixation of proximal  left femoral intertrochanteric fracture. Electronically Signed   By: Marijo Conception, M.D.   On: 12/13/2018 14:59   [place] Ekg Site Rite  Result Date: 12/12/2018 If Site Rite image not attached, placement could not be confirmed due to current cardiac rhythm.       Scheduled Meds: . ALPRAZolam  0.5 mg Oral Daily  . amLODipine  10 mg Oral q morning - 10a  . atorvastatin  10 mg Oral QPM  . docusate sodium  100 mg Oral BID  . donepezil  10 mg Oral QPM  . doxepin  6 mg Oral QHS  . mirtazapine  7.5 mg Oral QHS  . oxybutynin  5 mg Oral BID  . predniSONE  1 mg Oral Q breakfast  . rOPINIRole  1 mg Oral QHS  . traMADol  50 mg Oral Q6H  . warfarin  4 mg Oral ONCE-1800  . Warfarin - Pharmacist Dosing Inpatient   Does not  apply q1800   Continuous Infusions: . sodium chloride 50 mL/hr at 12/13/18 1623  .  ceFAZolin (ANCEF) IV    . methocarbamol (ROBAXIN) IV Stopped (12/13/18 1513)  . phenylephrine       LOS: 2 days   Time spent= 25 mins    Nita Sells, MD [facility]  If 7PM-7AM, please contact night-coverage [email or web address] 12/13/2018, 4:21 PM

## 2018-12-13 NOTE — Anesthesia Procedure Notes (Signed)
Procedure Name: Intubation Date/Time: 12/13/2018 12:57 PM Performed by: Maxwell Caul, CRNA Pre-anesthesia Checklist: Patient identified, Emergency Drugs available, Suction available and Patient being monitored Patient Re-evaluated:Patient Re-evaluated prior to induction Oxygen Delivery Method: Circle system utilized Preoxygenation: Pre-oxygenation with 100% oxygen Induction Type: IV induction Ventilation: Mask ventilation without difficulty Laryngoscope Size: Mac and 3 Grade View: Grade I Tube type: Oral Tube size: 7.0 mm Number of attempts: 1 Airway Equipment and Method: Stylet Placement Confirmation: ETT inserted through vocal cords under direct vision,  positive ETCO2 and breath sounds checked- equal and bilateral Secured at: 21 cm Tube secured with: Tape Dental Injury: Teeth and Oropharynx as per pre-operative assessment

## 2018-12-13 NOTE — Progress Notes (Signed)
ANTICOAGULATION CONSULT NOTE - Initial Consult  Pharmacy Consult for warfarin Indication: pulmonary embolus and VTE prophylaxis  Allergies  Allergen Reactions  . Ambien [Zolpidem Tartrate] Nausea And Vomiting  . Codeine Phosphate Nausea And Vomiting  . Darvocet [Propoxyphene N-Acetaminophen] Nausea And Vomiting  . Fish-Derived Products   . Promethazine Hcl Nausea And Vomiting  . Sulfamethoxazole Other (See Comments)    Pt doesn't remember reaction  . Vicodin [Hydrocodone-Acetaminophen] Other (See Comments)    Unknown reaction  . Amoxicillin Nausea And Vomiting and Rash  . Penicillins Nausea And Vomiting and Rash    Patient Measurements: Height: 5\' 2"  (157.5 cm) Weight: 126 lb (57.2 kg) IBW/kg (Calculated) : 50.1  Vital Signs: Temp: 98.6 F (37 C) (02/12 1123) Temp Source: Oral (02/12 1123) BP: 175/69 (02/12 1123) Pulse Rate: 91 (02/12 1123)  Labs: Recent Labs    12/11/18 2308 12/12/18 0834 12/12/18 1257 12/13/18 0316  HGB 11.0* 11.0*  --  10.0*  HCT 36.4 37.1  --  32.9*  PLT 311 310  --  283  LABPROT 29.0* 30.1* 26.3* 20.1*  INR 2.78 2.92 2.46 1.74  CREATININE 1.11* 0.89  --  0.90    Estimated Creatinine Clearance: 29.6 mL/min (by C-G formula based on SCr of 0.9 mg/dL).   Medical History: Past Medical History:  Diagnosis Date  . Anemia 03/02/2012  . Angina pectoris, unstable ([place]) 03/02/2012  . Anxiety   . Axillary abscess   . Bradycardia 06/13/2012  . CAD (coronary artery disease)    S/p PTCA / stenting (last cath 2004, multiple LAD stents, 2 stents in the right coronary artery all patent)  . Cancer ([place])   . Complication of anesthesia   . Compression fracture of L2 ([place]) 03/12/2017  . Dementia ([place])   . Dysphagia 10/06/2012  . Dyspnea 02/03/2012  . GERD (gastroesophageal reflux disease)   . History of pulmonary embolus (PE) 03/19/2017  . HTN (hypertension)   . PMR (polymyalgia rheumatica) (HCC)   . PONV (postoperative nausea and vomiting)   . Restless  leg syndrome   . 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 Started coumadin  . Weakness of both legs 06/09/2012    Medications:  Scheduled:  . [MAR Hold] ALPRAZolam  0.5 mg Oral Daily  . [MAR Hold] amLODipine  10 mg Oral q morning - 10a  . [MAR Hold] atorvastatin  10 mg Oral QPM  . [MAR Hold] donepezil  10 mg Oral QPM  . [MAR Hold] doxepin  6 mg Oral QHS  . [MAR Hold] hydrALAZINE  25 mg Oral Q8H  . [MAR Hold] mirtazapine  7.5 mg Oral QHS  . [MAR Hold] oxybutynin  5 mg Oral BID  . povidone-iodine  2 application Topical On Call  . [MAR Hold] predniSONE  1 mg Oral Q breakfast  . [MAR Hold] rOPINIRole  1 mg Oral QHS  . [MAR Hold] sodium chloride flush  10-40 mL Intracatheter Q12H   Infusions:  . [MAR Hold] sodium chloride 1,000 mL (12/12/18 0103)  .  ceFAZolin (ANCEF) IV    . lactated ringers 20 mL/hr at 12/13/18 1146  . phenylephrine      Assessment: 85 yo female POD0 ORIF left hip fracture. To resume warfarin post op per orders. Patient on warfarin prior to admission for hx PE at a dose of 3mg  daily. Note that 10mg  vitamin K PO given total yesterday in order to lower INR < 2 in order to have surgery today, so may be element of  warfarin resistant to start out when resuming warfarin this evening. INR 1.74 this AM  Goal of Therapy:  INR 2-3    Plan:  1) Warfarin 4mg  today at 1800 2) Daily INR   Adrian Saran, PharmD, BCPS Pager [phone] 12/13/2018 1:09 PM

## 2018-12-13 NOTE — Progress Notes (Signed)
Patient ID: [name] [name], female   DOB: September 10, 1921, 85 y.o.   MRN: [id number]  Seemingly resting comfortably, no events  AFVSS Arousable, alert  INR 1.75  Left IT femur fracture NPO To OR today for ORIF left hip fracture

## 2018-12-13 NOTE — Transfer of Care (Signed)
Immediate Anesthesia Transfer of Care Note  Patient: [name] [name]  Procedure(s) Performed: INTRAMEDULLARY (IM) NAIL FEMORAL LEFT (Left )  Patient Location: PACU  Anesthesia Type:General  Level of Consciousness: awake, alert  and oriented  Airway & Oxygen Therapy: Patient Spontanous Breathing and Patient connected to face mask oxygen  Post-op Assessment: Report given to RN and Post -op Vital signs reviewed and stable  Post vital signs: Reviewed and stable  Last Vitals:  Vitals Value Taken Time  BP    Temp    Pulse 76 12/13/2018  2:17 PM  Resp 16 12/13/2018  2:17 PM  SpO2 100 % 12/13/2018  2:17 PM  Vitals shown include unvalidated device data.  Last Pain:  Vitals:   12/13/18 1123  TempSrc: Oral  PainSc:       Patients Stated Pain Goal: 5 (76/73/41 9379)  Complications: No apparent anesthesia complications

## 2018-12-13 NOTE — Anesthesia Preprocedure Evaluation (Signed)
Anesthesia Evaluation  Patient identified by MRN, date of birth, ID band Patient awake    Reviewed: Allergy & Precautions, NPO status , Patient's Chart, lab work & pertinent test results  History of Anesthesia Complications (+) PONV  Airway Mallampati: I  TM Distance: >3 FB Neck ROM: Full    Dental   Pulmonary    Pulmonary exam normal        Cardiovascular hypertension, + CAD and + Cardiac Stents  Normal cardiovascular exam     Neuro/Psych Anxiety Depression Dementia    GI/Hepatic GERD  Medicated and Controlled,  Endo/Other    Renal/GU Renal InsufficiencyRenal disease     Musculoskeletal   Abdominal   Peds  Hematology   Anesthesia Other Findings   Reproductive/Obstetrics                             Anesthesia Physical Anesthesia Plan  ASA: III  Anesthesia Plan: General   Post-op Pain Management:    Induction: Intravenous  PONV Risk Score and Plan: 4 or greater and Ondansetron and Treatment may vary due to age or medical condition  Airway Management Planned: Oral ETT  Additional Equipment:   Intra-op Plan:   Post-operative Plan: Extubation in OR  Informed Consent: I have reviewed the patients History and Physical, chart, labs and discussed the procedure including the risks, benefits and alternatives for the proposed anesthesia with the patient or authorized representative who has indicated his/her understanding and acceptance.       Plan Discussed with: CRNA and Surgeon  Anesthesia Plan Comments:         Anesthesia Quick Evaluation

## 2018-12-13 NOTE — Care Management Note (Signed)
Case Management Note  Patient Details  Name: GWENDALYN MCGONAGLE MRN: [id number] Date of Birth: February 23, 1921  Subjective/Objective:    Plan for d/c to SNF, discharge planning per CSW. [phone]                Action/Plan:   Expected Discharge Date:                  Expected Discharge Plan:  [place]  In-House Referral:  Clinical Social Work  Discharge planning Services  CM Consult  Post Acute Care Choice:  NA Choice offered to:  Patient  DME Arranged:  N/A DME Agency:  NA  HH Arranged:  NA HH Agency:  NA  Status of Service:  In process, will continue to follow  If discussed at Long Length of Stay Meetings, dates discussed:    Additional Comments:  Guadalupe Maple, RN 12/13/2018, 9:54 AM

## 2018-12-14 ENCOUNTER — Encounter (HOSPITAL_COMMUNITY): Payer: Self-pay | Admitting: Orthopedic Surgery

## 2018-12-14 LAB — BASIC METABOLIC PANEL
Anion gap: 6 (ref 5–15)
BUN: 25 mg/dL — ABNORMAL HIGH (ref 8–23)
CO2: 22 mmol/L (ref 22–32)
Calcium: 8.3 mg/dL — ABNORMAL LOW (ref 8.9–10.3)
Chloride: 110 mmol/L (ref 98–111)
Creatinine, Ser: 1.13 mg/dL — ABNORMAL HIGH (ref 0.44–1.00)
GFR calc Af Amer: 48 mL/min — ABNORMAL LOW (ref >60)
GFR calc non Af Amer: 41 mL/min — ABNORMAL LOW (ref >60)
Glucose, Bld: 144 mg/dL — ABNORMAL HIGH (ref 70–99)
Potassium: 3.9 mmol/L (ref 3.5–5.1)
Sodium: 138 mmol/L (ref 135–145)

## 2018-12-14 LAB — PROTIME-INR
INR: 1.89
Prothrombin Time: 21.5 seconds — ABNORMAL HIGH (ref 11.4–15.2)

## 2018-12-14 LAB — CBC
HCT: 29.5 % — ABNORMAL LOW (ref 36.0–46.0)
Hemoglobin: 9.1 g/dL — ABNORMAL LOW (ref 12.0–15.0)
MCH: 24.7 pg — AB (ref 26.0–34.0)
MCHC: 30.8 g/dL (ref 30.0–36.0)
MCV: 80.2 fL (ref 80.0–100.0)
Platelets: 220 10*3/uL (ref 150–400)
RBC: 3.68 MIL/uL — AB (ref 3.87–5.11)
RDW: 15.8 % — ABNORMAL HIGH (ref 11.5–15.5)
WBC: 11.5 10*3/uL — ABNORMAL HIGH (ref 4.0–10.5)
nRBC: 0 % (ref 0.0–0.2)

## 2018-12-14 LAB — MAGNESIUM: Magnesium: 2.1 mg/dL (ref 1.7–2.4)

## 2018-12-14 MED ORDER — WARFARIN SODIUM 4 MG PO TABS
4.0000 mg | ORAL_TABLET | Freq: Once | ORAL | Status: AC
  Administered 2018-12-14: 4 mg via ORAL
  Filled 2018-12-14: qty 1

## 2018-12-14 NOTE — Progress Notes (Signed)
PROGRESS NOTE    [name] [name]  IRJ:[id number] DOB: [birth year] DOA: 12/11/2018 PCP: Hennie Duos, MD   Brief Narrative:  85 year old  anxiety/depression,  coronary artery disease,  polymyalgia rheumatica,  DVT on Coumadin, dementia and essential hypertension   came to the hospital from skilled nursing facility with complaints of left severe hip pain on 2/10  She was found to have left intertrochanteric femoral neck fracture.  Orthopedic was consulted.  She was given vitamin K due to elevated INR.   Assessment & Plan:   Principal Problem:   Closed left hip fracture, initial encounter ([place]) Active Problems:   CAD (coronary artery disease)   HTN (hypertension)   PMR (polymyalgia rheumatica) (HCC)   History of pulmonary embolus (PE)   CKD (chronic kidney disease), stage III (HCC)   Depression with anxiety  Left intertrochanteric hip fracture -Orthopedic following and performed surgery 2/12 -Pain control, PT/OT thereafter. - cleared by orthopedic, Coumadin resumed -Await therapy evals and then eventual return to SNF  History of DVT/pulmonary embolism -No current evidence of this at this time  History of polymyalgia rheumatica -Currently stable on low-dose prednisone.  We will continue this for now.   Coronary artery disease -Currently home oral medications on hold.  Holding antihypertensives for now until the surgery.  Patient is currently chest pain-free.  Essential hypertension -Continue Norvasc and hydralazine.  Have also added IV as needed hydralazine as needed.  Blood pressure controlled  History of depression, anxiety and dementia -some risk becoming delirious in the hospital.  She is aware of this.  Will use basic delirium precaution if necessary.  In the meantime continue her home medications including Aricept, doxepin, Remeron and Xanax.  Chronic kidney disease stage III -Renal function appears to be stable. -rpt am labs as not eating much  DVT  prophylaxis: coumadin at this time Code Status: Patient is DNR. Family Communication:  Disposition Plan: Maintain inpatient stay until the surgery  Consultants:   Orthopedic  Procedures:   None thus far  Antimicrobials:   None   Subjective:  Doing fair Some LLE pain as expected Nursing encouraging fluids--patient not really eating much No other issues other than pain   Review of Systems Otherwise negative except as per HPI, including: General: Denies fever, chills, night sweats or unintended weight loss.  Objective: Vitals:   12/14/18 0537 12/14/18 0854 12/14/18 0854 12/14/18 1006  BP: (!) 145/57 (!) 143/56 (!) 143/56 129/60  Pulse: 61 70 72 60  Resp: 15 16  17   Temp: (!) 97.5 F (36.4 C) 97.7 F (36.5 C)  (!) 97.4 F (36.3 C)  TempSrc:  Oral  Oral  SpO2: 99% 99% 100% 95%  Weight:      Height:        Intake/Output Summary (Last 24 hours) at 12/14/2018 1052 Last data filed at 12/14/2018 1004 Gross per 24 hour  Intake 2015.52 ml  Output 905 ml  Net 1110.52 ml   Filed Weights   12/11/18 2242  Weight: 57.2 kg    Examination:  Pleasant awake no ict Pinpoint like pupils but coerent eomi cta b no crackles or rales No le edema ROM intact  SCD on Neuro intact    Data Reviewed:   CBC: Recent Labs  Lab 12/11/18 2308 12/12/18 0834 12/13/18 0316 12/14/18 0536  WBC 14.6* 13.2* 13.0* 11.5*  NEUTROABS 12.6*  --   --   --   HGB 11.0* 11.0* 10.0* 9.1*  HCT 36.4 37.1 32.9* 29.5*  MCV  83.7 84.3 83.1 80.2  PLT 311 310 283 144   Basic Metabolic Panel: Recent Labs  Lab 12/11/18 2308 12/12/18 0834 12/13/18 0316 12/14/18 0536  NA 137 135 139 138  K 4.2 3.9 3.8 3.9  CL 105 104 108 110  CO2 22 22 22 22   GLUCOSE 123* 132* 118* 144*  BUN 20 18 20  25*  CREATININE 1.11* 0.89 0.90 1.13*  CALCIUM 8.8* 8.7* 8.5* 8.3*  MG  --  2.2 2.1 2.1   GFR: Estimated Creatinine Clearance: 23.6 mL/min (A) (by C-G formula based on SCr of 1.13 mg/dL (H)). Liver  Function Tests: No results for input(s): AST, ALT, ALKPHOS, BILITOT, PROT, ALBUMIN in the last 168 hours. No results for input(s): LIPASE, AMYLASE in the last 168 hours. No results for input(s): AMMONIA in the last 168 hours. Coagulation Profile: Recent Labs  Lab 12/11/18 2308 12/12/18 0834 12/12/18 1257 12/13/18 0316 12/14/18 0536  INR 2.78 2.92 2.46 1.74 1.89   Cardiac Enzymes: No results for input(s): CKTOTAL, CKMB, CKMBINDEX, TROPONINI in the last 168 hours. BNP (last 3 results) No results for input(s): PROBNP in the last 8760 hours. HbA1C: No results for input(s): HGBA1C in the last 72 hours. CBG: No results for input(s): GLUCAP in the last 168 hours. Lipid Profile: No results for input(s): CHOL, HDL, LDLCALC, TRIG, CHOLHDL, LDLDIRECT in the last 72 hours. Thyroid Function Tests: No results for input(s): TSH, T4TOTAL, FREET4, T3FREE, THYROIDAB in the last 72 hours. Anemia Panel: No results for input(s): VITAMINB12, FOLATE, FERRITIN, TIBC, IRON, RETICCTPCT in the last 72 hours. Sepsis Labs: No results for input(s): PROCALCITON, LATICACIDVEN in the last 168 hours.  Recent Results (from the past 240 hour(s))  MRSA PCR Screening     Status: None   Collection Time: 12/12/18 11:14 AM  Result Value Ref Range Status   MRSA by PCR NEGATIVE NEGATIVE Final    Comment:        The GeneXpert MRSA Assay (FDA approved for NASAL specimens only), is one component of a comprehensive MRSA colonization surveillance program. It is not intended to diagnose MRSA infection nor to guide or monitor treatment for MRSA infections. Performed at [facility], [place] [address], [place], [place] [address]          Radiology Studies: Dg C-arm 1-60 Min-no Report  Result Date: 12/13/2018 Fluoroscopy was utilized by the requesting physician.  No radiographic interpretation.   Dg Femur, Min 2 Views Right  Result Date: 12/13/2018 CLINICAL DATA:  Status post surgical  fixation of proximal left femoral fracture. EXAM: RIGHT FEMUR 2 VIEWS Radiation exposure index: 6.5328 mGy. COMPARISON:  Radiograph of December 11, 2018. FINDINGS: Four intraoperative fluoroscopic images demonstrate surgical internal fixation of proximal left femoral inter trochanteric fracture. Good alignment of fracture components is noted. IMPRESSION: Status post surgical internal fixation of proximal left femoral intertrochanteric fracture. Electronically Signed   By: Marijo Conception, M.D.   On: 12/13/2018 14:59   [place] Ekg Site Rite  Result Date: 12/12/2018 If Site Rite image not attached, placement could not be confirmed due to current cardiac rhythm.       Scheduled Meds: . ALPRAZolam  0.5 mg Oral Daily  . amLODipine  10 mg Oral q morning - 10a  . atorvastatin  10 mg Oral QPM  . docusate sodium  100 mg Oral BID  . donepezil  10 mg Oral QPM  . doxepin  6 mg Oral QHS  . mirtazapine  7.5 mg Oral QHS  .  oxybutynin  5 mg Oral BID  . predniSONE  1 mg Oral Q breakfast  . rOPINIRole  1 mg Oral QHS  . traMADol  50 mg Oral Q6H  . Warfarin - Pharmacist Dosing Inpatient   Does not apply q1800   Continuous Infusions: . sodium chloride 50 mL/hr at 12/14/18 0600  . methocarbamol (ROBAXIN) IV Stopped (12/13/18 1513)     LOS: 3 days   Time spent= 25 mins    Nita Sells, MD [facility]  If 7PM-7AM, please contact night-coverage [email or web address] 12/14/2018, 10:52 AM

## 2018-12-14 NOTE — Anesthesia Postprocedure Evaluation (Signed)
Anesthesia Post Note  Patient: [name] [name]  Procedure(s) Performed: INTRAMEDULLARY (IM) NAIL FEMORAL LEFT (Left )     Anesthesia Post Evaluation  Last Vitals:  Vitals:   12/14/18 1006 12/14/18 1326  BP: 129/60 (!) 143/65  Pulse: 60 71  Resp: 17 16  Temp: (!) 36.3 C (!) 36.4 C  SpO2: 95% 99%    Last Pain:  Vitals:   12/14/18 1927  TempSrc:   PainSc: Asleep                 Nidhi Jacome

## 2018-12-14 NOTE — Clinical Social Work Note (Signed)
Clinical Social Work Assessment  Patient Details  Name: [name] [name] MRN: [id number] Date of Birth: 09/07/1921  Date of referral:  12/12/18               Reason for consult:  Discharge Planning                Permission sought to share information with:  Chartered certified accountant granted to share information::  Yes, Verbal Permission Granted  Name::        Agency::  [facility].   Relationship::     Contact Information:     Housing/Transportation Living arrangements for the past 2 months:  [place] of Information:  Adult Children Patient Interpreter Needed:  None Criminal Activity/Legal Involvement Pertinent to Current Situation/Hospitalization:  No - Comment as needed Significant Relationships:  Adult Children Lives with:  Facility Resident Do you feel safe going back to the place where you live?  Yes Need for family participation in patient care:  Yes  Care giving concerns:  Patient has depression with anxiety, coronary artery disease, polymyalgia rheumatica, dementia, and hypertension, now presenting to the emergency department from her SNF for evaluation of severe left hip pain after a fall.  Patient reports that she was in her usual state of health when she fell onto her left side this evening and experienced immediate and severe pain at the left hip.   Social Worker assessment / plan:  CSW discussed discharge planning with the patient son. The patient is a long term resident at the [facility] and the patient will return at discharge. Per son prior to the patient admitting the patient used a walker and wheel at baseline. Patient needs assistance with bathing and dressing.  FL2 complete.   Plan: Return to SNF [facility]   Employment status:    Insurance information:  Managed Medicare PT Recommendations:  [place] / Referral to community resources:  [place]  Patient/Family's  Response to care: Agreeable and Responding well to care.   Patient/Family's Understanding of and Emotional Response to Diagnosis, Current Treatment, and Prognosis:  Patient son and facility supports are very involved in the patient care.   Emotional Assessment Appearance:  Appears stated age Attitude/Demeanor/Rapport:    Affect (typically observed):  Calm Orientation:  Oriented to Self, Oriented to Place Alcohol / Substance use:  Not Applicable Psych involvement (Current and /or in the community):  No (Comment)  Discharge Needs  Concerns to be addressed:  Discharge Planning Concerns Readmission within the last 30 days:  No Current discharge risk:  Dependent with Mobility, Physical Impairment Barriers to Discharge:  Continued Medical Work up, [place], LCSW 12/14/2018, 1:34 PM

## 2018-12-14 NOTE — Plan of Care (Signed)
  Problem: Activity: Goal: Risk for activity intolerance will decrease Outcome: Progressing   Problem: Coping: Goal: Level of anxiety will decrease Outcome: Progressing   Problem: Pain Managment: Goal: General experience of comfort will improve Outcome: Progressing   

## 2018-12-14 NOTE — Care Management Important Message (Signed)
Important Message  Patient Details  Name: [name] [name] MRN: [id number] Date of Birth: 1920-11-22   Medicare Important Message Given:  Yes    Kerin Salen 12/14/2018, 10:12 AMImportant Message  Patient Details  Name: [name] [name] MRN: [id number] Date of Birth: 04/24/1921   Medicare Important Message Given:  Yes    Kerin Salen 12/14/2018, 10:12 AM

## 2018-12-14 NOTE — NC FL2 (Signed)
[place] LEVEL OF CARE SCREENING TOOL     IDENTIFICATION  Patient Name: [name] [name] Birthdate: [birth year] Sex: female Admission Date (Current Location): 12/11/2018  [facility] and [place] Number:  Herbalist and Address:  [facility],  [place] [address] [place]      Provider Number: [id number]  Attending Physician Name and Address:  Nita Sells, MD  Relative Name and Phone Number:       Current Level of Care: Hospital Recommended Level of Care: [place] Prior Approval Number:    Date Approved/Denied:   PASRR Number:  [id number]   Discharge Plan: SNF    Current Diagnoses: Patient Active Problem List   Diagnosis Date Noted  . Closed left hip fracture, initial encounter ([place]) 12/11/2018  . CKD (chronic kidney disease), stage III ([place]) 12/11/2018  . Depression with anxiety 12/11/2018  . Overactive bladder 09/24/2017  . Insomnia 07/31/2017  . Anxiety 06/16/2017  . Citrobacter infection 03/19/2017  . AKI (acute kidney injury) ([place]) 03/19/2017  . History of pulmonary embolus (PE) 03/19/2017  . Elevated INR 03/19/2017  . [place] intertrigo 03/19/2017  . Compression fracture of L2 ([place]) 03/12/2017  . Acute lower UTI 03/12/2017  . Accelerated hypertension 03/12/2017  . Leukocytosis 07/31/2015  . Acute bronchitis 07/28/2015  . Chronic anticoagulation 07/28/2015  . Intractable back pain 07/20/2015  . Back pain 07/20/2015  . Cellulitis and abscess of left buttock 04/12/2013  . Stricture and stenosis of esophagus 10/10/2012  . Dementia ([place]) 10/06/2012  . Dysphagia 10/06/2012  . Axillary abscess - bilateral, multiple 09/27/2012  . Hypotension 08/24/2012  . Near syncope 08/24/2012  . Abscess of axilla, left 06/27/2012  . Bradycardia 06/13/2012  . Abscess 06/11/2012  . Chest pain 06/09/2012  . Cellulitis 06/09/2012  . Weakness of both legs 06/09/2012  . PMR (polymyalgia rheumatica) (HCC)   .  Restless leg syndrome   . Angina pectoris, unstable ([place]) 03/02/2012  . Anemia 03/02/2012  . Dyspnea 02/03/2012  . UTI (urinary tract infection) 02/03/2012  . CAD (coronary artery disease)   . HTN (hypertension)   . VTE (venous thromboembolism) 10/01/2010  . HYPERCHOLESTEROLEMIA  IIA 08/27/2009  . Hyperlipidemia 12/11/2008  . HYPERTENSION, BENIGN 12/11/2008    Orientation RESPIRATION BLADDER Height & Weight     Self, Place  Normal External catheter, Continent Weight: 126 lb (57.2 kg) Height:  5\' 2"  (157.5 cm)  BEHAVIORAL SYMPTOMS/MOOD NEUROLOGICAL BOWEL NUTRITION STATUS      Continent Diet(See discharge summary)  AMBULATORY STATUS COMMUNICATION OF NEEDS Skin   Limited Assist Verbally Surgical wounds(Left hip incision)                       Personal Care Assistance Level of Assistance  Bathing, Feeding, Dressing Bathing Assistance: Limited assistance Feeding assistance: Independent Dressing Assistance: Limited assistance     Functional Limitations Info  Hearing   Hearing Info: Impaired      SPECIAL CARE FACTORS FREQUENCY  PT (By licensed PT), OT (By licensed OT)     PT Frequency: 5 OT Frequency: 5            Contractures      Additional Factors Info  Code Status Code Status Info: DNR             Current Medications (12/14/2018):  This is the current hospital active medication list Current Facility-Administered Medications  Medication Dose Route Frequency Provider Last Rate Last Dose  . 0.9 %  sodium chloride infusion   Intravenous Continuous Paralee Cancel, MD 50 mL/hr at 12/14/18 0600    . acetaminophen (TYLENOL) tablet 325-650 mg  325-650 mg Oral Q6H PRN Paralee Cancel, MD      . albuterol (PROVENTIL) (2.5 MG/3ML) 0.083% nebulizer solution 2.5 mg  2.5 mg Nebulization Q6H PRN Paralee Cancel, MD      . ALPRAZolam Duanne Moron) tablet 0.5 mg  0.5 mg Oral Daily Paralee Cancel, MD   0.5 mg at 12/14/18 0857  . amLODipine (NORVASC) tablet 10 mg  10 mg Oral q  morning - 10a Paralee Cancel, MD   10 mg at 12/14/18 0857  . atorvastatin (LIPITOR) tablet 10 mg  10 mg Oral QPM Paralee Cancel, MD   10 mg at 12/13/18 1747  . bisacodyl (DULCOLAX) suppository 10 mg  10 mg Rectal Daily PRN Paralee Cancel, MD      . docusate sodium (COLACE) capsule 100 mg  100 mg Oral BID Paralee Cancel, MD   100 mg at 12/14/18 0857  . donepezil (ARICEPT) tablet 10 mg  10 mg Oral QPM Paralee Cancel, MD   10 mg at 12/13/18 1747  . doxepin (SINEQUAN) 10 MG/ML solution 6 mg  6 mg Oral QHS Paralee Cancel, MD   6 mg at 12/13/18 2147  . hydrALAZINE (APRESOLINE) injection 10 mg  10 mg Intravenous Q4H PRN Paralee Cancel, MD      . menthol-cetylpyridinium (CEPACOL) lozenge 3 mg  1 lozenge Oral PRN Paralee Cancel, MD       Or  . phenol (CHLORASEPTIC) mouth spray 1 spray  1 spray Mouth/Throat PRN Paralee Cancel, MD      . methocarbamol (ROBAXIN) tablet 500 mg  500 mg Oral Q6H PRN Paralee Cancel, MD   500 mg at 12/14/18 0857   Or  . methocarbamol (ROBAXIN) 500 mg in dextrose 5 % 50 mL IVPB  500 mg Intravenous Q6H PRN Paralee Cancel, MD   Stopped at 12/13/18 1513  . metoCLOPramide (REGLAN) tablet 5-10 mg  5-10 mg Oral Q8H PRN Paralee Cancel, MD       Or  . metoCLOPramide (REGLAN) injection 5-10 mg  5-10 mg Intravenous Q8H PRN Paralee Cancel, MD      . mirtazapine (REMERON) tablet 7.5 mg  7.5 mg Oral QHS Paralee Cancel, MD   7.5 mg at 12/13/18 2148  . morphine 2 MG/ML injection 0.5-1 mg  0.5-1 mg Intravenous Q2H PRN Paralee Cancel, MD      . ondansetron [facility]) tablet 4 mg  4 mg Oral Q6H PRN Paralee Cancel, MD       Or  . ondansetron [facility]) injection 4 mg  4 mg Intravenous Q6H PRN Paralee Cancel, MD      . oxybutynin (DITROPAN) tablet 5 mg  5 mg Oral BID Paralee Cancel, MD   5 mg at 12/14/18 0857  . polyethylene glycol (MIRALAX / GLYCOLAX) packet 17 g  17 g Oral Daily PRN Paralee Cancel, MD   17 g at 12/14/18 1039  . predniSONE (DELTASONE) tablet 1 mg  1 mg Oral Q breakfast Paralee Cancel, MD   1 mg at  12/14/18 0857  . rOPINIRole (REQUIP) tablet 1 mg  1 mg Oral QHS Paralee Cancel, MD   1 mg at 12/13/18 2148  . sodium phosphate (FLEET) 7-19 GM/118ML enema 1 enema  1 enema Rectal Once PRN Paralee Cancel, MD      . traMADol Veatrice Bourbon) tablet 50 mg  50 mg Oral Q6H Paralee Cancel, MD   50  mg at 12/14/18 1300  . warfarin (COUMADIN) tablet 4 mg  4 mg Oral ONCE-1800 Nita Sells, MD      . Warfarin - Pharmacist Dosing Inpatient   Does not apply [place], [facility]         Discharge Medications: Please see discharge summary for a list of discharge medications.  Relevant Imaging Results:  Relevant Lab Results:   Additional Information SSN: [id number]  Ollen Barges, LCSWA

## 2018-12-14 NOTE — Progress Notes (Signed)
Patient ID: [name] [name], female   DOB: 1920-12-18, 85 y.o.   MRN: [id number] Subjective: 1 Day Post-Op Procedure(s) (LRB): INTRAMEDULLARY (IM) NAIL FEMORAL LEFT (Left)    Patient reports pain as mild.  Reportedly was having some neck pain as roll under neck this am.  No other events  Objective:   VITALS:   Vitals:   12/14/18 0259 12/14/18 0537  BP: (!) 136/55 (!) 145/57  Pulse: 74 61  Resp: 16 15  Temp: 97.6 F (36.4 C) (!) 97.5 F (36.4 C)  SpO2: 94% 99%    Neurovascular intact Incision: dressing C/D/I  LABS Recent Labs    12/12/18 0834 12/13/18 0316 12/14/18 0536  HGB 11.0* 10.0* 9.1*  HCT 37.1 32.9* 29.5*  WBC 13.2* 13.0* 11.5*  PLT 310 283 220    Recent Labs    12/12/18 0834 12/13/18 0316 12/14/18 0536  NA 135 139 138  K 3.9 3.8 3.9  BUN 18 20 25*  CREATININE 0.89 0.90 1.13*  GLUCOSE 132* 118* 144*    Recent Labs    12/13/18 0316 12/14/18 0536  INR 1.74 1.89     Assessment/Plan: 1 Day Post-Op Procedure(s) (LRB): INTRAMEDULLARY (IM) NAIL FEMORAL LEFT (Left)   Advance diet Up with therapy - WBAT D/C plan to return to SNF when stable, tomorrow versus Saturday RTC in 2 weeks Coumadin for DVT proph as already on it

## 2018-12-14 NOTE — Evaluation (Signed)
Physical Therapy Evaluation Patient Details Name: [name] [name] MRN: [id number] DOB: 06/23/1921 Today's Date: 12/14/2018   History of Present Illness  Pt admitted from SNF s/p fall with L femur fx and with IM nail repair.  Pt with hx of CAD and dementia  Clinical Impression  Pt admitted as above and presenting with functional mobility limitations 2* generalized weakness; decreased L LE strength/ROM, post op pain, and balance deficits.  Pt would benefit from follow up rehab at SNF level.    Follow Up Recommendations SNF    Equipment Recommendations  None recommended by PT    Recommendations for Other Services       Precautions / Restrictions Precautions Precautions: Fall Restrictions Weight Bearing Restrictions: No LLE Weight Bearing: Weight bearing as tolerated      Mobility  Bed Mobility Overal bed mobility: Needs Assistance Bed Mobility: Supine to Sit;Sit to Supine     Supine to sit: Max assist;+2 for physical assistance Sit to supine: Total assist;+2 for physical assistance   General bed mobility comments: Pt assisting slightly to shift in bed and bring trunk to upright but total assist with pad to return to bed  Transfers Overall transfer level: Needs assistance   Transfers: Sit to/from Stand Sit to Stand: Mod assist;+2 physical assistance;+2 safety/equipment;From elevated surface         General transfer comment: cues for use of UEs and LE management; Pt achieved semi- erect standing but with posterior drift and unable to take step with either foot.  Pt stood x 2 and returned to bed 2* fatigue  Ambulation/Gait             General Gait Details: Stood x 2 but unable to step with either foot  Stairs            Wheelchair Mobility    Modified Rankin (Stroke Patients Only)       Balance Overall balance assessment: Needs assistance Sitting-balance support: Feet supported;Bilateral upper extremity supported Sitting balance-Leahy Scale: Poor     Standing balance support: Bilateral upper extremity supported Standing balance-Leahy Scale: Zero                               Pertinent Vitals/Pain Pain Assessment: Faces Faces Pain Scale: Hurts even more Pain Location: L LE with movement Pain Descriptors / Indicators: Moaning;Grimacing;Guarding Pain Intervention(s): Limited activity within patient's tolerance;Monitored during session;Premedicated before session;Ice applied    Home Living Family/patient expects to be discharged to:: Skilled nursing facility                      Prior Function Level of Independence: Needs assistance   Gait / Transfers Assistance Needed: Used RW  ADL's / Homemaking Assistance Needed: SNF resident        Hand Dominance   Dominant Hand: Right    Extremity/Trunk Assessment   Upper Extremity Assessment Upper Extremity Assessment: Generalized weakness    Lower Extremity Assessment Lower Extremity Assessment: Generalized weakness;LLE deficits/detail    Cervical / Trunk Assessment Cervical / Trunk Assessment: Kyphotic  Communication   Communication: HOH  Cognition Arousal/Alertness: Awake/alert Behavior During Therapy: WFL for tasks assessed/performed Overall Cognitive Status: Within Functional Limits for tasks assessed                                        General  Comments      Exercises     Assessment/Plan    PT Assessment Patient needs continued PT services  PT Problem List Decreased strength;Decreased range of motion;Decreased activity tolerance;Decreased balance;Decreased mobility;Decreased cognition;Decreased knowledge of use of DME;Decreased safety awareness;Pain       PT Treatment Interventions DME instruction;Gait training;Stair training;Functional mobility training;Therapeutic activities;Therapeutic exercise;Patient/family education;Balance training    PT Goals (Current goals can be found in the Care Plan section)  Acute  Rehab PT Goals Patient Stated Goal: Back to bed PT Goal Formulation: With patient Time For Goal Achievement: 12/28/18 Potential to Achieve Goals: Fair    Frequency Min 2X/week   Barriers to discharge        Co-evaluation               AM-PAC PT "6 Clicks" Mobility  Outcome Measure Help needed turning from your back to your side while in a flat bed without using bedrails?: A Lot Help needed moving from lying on your back to sitting on the side of a flat bed without using bedrails?: A Lot Help needed moving to and from a bed to a chair (including a wheelchair)?: Total Help needed standing up from a chair using your arms (e.g., wheelchair or bedside chair)?: Total Help needed to walk in hospital room?: Total Help needed climbing 3-5 steps with a railing? : Total 6 Click Score: 8    End of Session Equipment Utilized During Treatment: Gait belt Activity Tolerance: Patient limited by fatigue;Patient limited by pain Patient left: in bed;with call bell/phone within reach;with bed alarm set Nurse Communication: Mobility status PT Visit Diagnosis: Muscle weakness (generalized) (M62.81);History of falling (Z91.81);Difficulty in walking, not elsewhere classified (R26.2);Pain Pain - Right/Left: Left    Time: 6712-4580 PT Time Calculation (min) (ACUTE ONLY): 30 min   Charges:   PT Evaluation $PT Eval Low Complexity: 1 Low PT Treatments $Therapeutic Activity: 8-22 mins        Debe Coder PT Acute Rehabilitation Services Pager [phone] Office [phone]   Traycen Goyer 12/14/2018, 3:32 PM

## 2018-12-14 NOTE — Plan of Care (Signed)

## 2018-12-14 NOTE — Progress Notes (Signed)
ANTICOAGULATION CONSULT NOTE - [place] for warfarin Indication: pulmonary embolus and VTE prophylaxis  Allergies  Allergen Reactions  . Ambien [Zolpidem Tartrate] Nausea And Vomiting  . Codeine Phosphate Nausea And Vomiting  . Darvocet [Propoxyphene N-Acetaminophen] Nausea And Vomiting  . Fish-Derived Products   . Promethazine Hcl Nausea And Vomiting  . Sulfamethoxazole Other (See Comments)    Pt doesn't remember reaction  . Vicodin [Hydrocodone-Acetaminophen] Other (See Comments)    Unknown reaction  . Amoxicillin Nausea And Vomiting and Rash  . Penicillins Nausea And Vomiting and Rash    Patient Measurements: Height: 5\' 2"  (157.5 cm) Weight: 126 lb (57.2 kg) IBW/kg (Calculated) : 50.1  Vital Signs: Temp: 97.4 F (36.3 C) (02/13 1006) Temp Source: Oral (02/13 1006) BP: 129/60 (02/13 1006) Pulse Rate: 60 (02/13 1006)  Labs: Recent Labs    12/12/18 0834 12/12/18 1257 12/13/18 0316 12/14/18 0536  HGB 11.0*  --  10.0* 9.1*  HCT 37.1  --  32.9* 29.5*  PLT 310  --  283 220  LABPROT 30.1* 26.3* 20.1* 21.5*  INR 2.92 2.46 1.74 1.89  CREATININE 0.89  --  0.90 1.13*    Estimated Creatinine Clearance: 23.6 mL/min (A) (by C-G formula based on SCr of 1.13 mg/dL (H)).   Medical History: Past Medical History:  Diagnosis Date  . Anemia 03/02/2012  . Angina pectoris, unstable ([place]) 03/02/2012  . Anxiety   . Axillary abscess   . Bradycardia 06/13/2012  . CAD (coronary artery disease)    S/p PTCA / stenting (last cath 2004, multiple LAD stents, 2 stents in the right coronary artery all patent)  . Cancer ([place])   . Complication of anesthesia   . Compression fracture of L2 ([place]) 03/12/2017  . Dementia ([place])   . Dysphagia 10/06/2012  . Dyspnea 02/03/2012  . GERD (gastroesophageal reflux disease)   . History of pulmonary embolus (PE) 03/19/2017  . HTN (hypertension)   . PMR (polymyalgia rheumatica) (HCC)   . PONV (postoperative nausea and vomiting)    . Restless leg syndrome   . 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 Started coumadin  . Weakness of both legs 06/09/2012    Medications:  Scheduled:  . ALPRAZolam  0.5 mg Oral Daily  . amLODipine  10 mg Oral q morning - 10a  . atorvastatin  10 mg Oral QPM  . docusate sodium  100 mg Oral BID  . donepezil  10 mg Oral QPM  . doxepin  6 mg Oral QHS  . mirtazapine  7.5 mg Oral QHS  . oxybutynin  5 mg Oral BID  . predniSONE  1 mg Oral Q breakfast  . rOPINIRole  1 mg Oral QHS  . traMADol  50 mg Oral Q6H  . Warfarin - Pharmacist Dosing Inpatient   Does not apply q1800   Infusions:  . sodium chloride 50 mL/hr at 12/14/18 0600  . methocarbamol (ROBAXIN) IV Stopped (12/13/18 1513)    Assessment: 85 yo female POD0 ORIF left hip fracture. To resume warfarin post op per orders. Patient on warfarin prior to admission for hx PE at a dose of 3mg  daily. Note that 10mg  vitamin K PO given total yesterday in order to lower INR < 2 in order to have surgery today, so may be element of warfarin resistant to start out when resuming warfarin this evening. INR 1.74 this AM  Today, 12/14/18   INR 1.89, trending up   Hgb 9.1, Plt 220   No  noted bleeding issues   Goal of Therapy:  INR 2-3    Plan:   Warfarin 4 mg PO x1   Daily INR  Monitor for signs and symptoms of bleeding    Royetta Asal, PharmD, BCPS Pager [phone] 12/14/2018 11:57 AM

## 2018-12-14 NOTE — Progress Notes (Signed)
CSW submitted clinicals to [facility] for prior authorization.   Kathrin Greathouse, Marlinda Mike, MSW Clinical Social Worker  [phone] 12/14/2018  3:51 PM

## 2018-12-15 LAB — BASIC METABOLIC PANEL
Anion gap: 7 (ref 5–15)
BUN: 25 mg/dL — ABNORMAL HIGH (ref 8–23)
CALCIUM: 7.8 mg/dL — AB (ref 8.9–10.3)
CO2: 23 mmol/L (ref 22–32)
CREATININE: 0.81 mg/dL (ref 0.44–1.00)
Chloride: 108 mmol/L (ref 98–111)
GFR calc Af Amer: >60 mL/min (ref >60)
GFR calc non Af Amer: >60 mL/min (ref >60)
Glucose, Bld: 85 mg/dL (ref 70–99)
Potassium: 3.4 mmol/L — ABNORMAL LOW (ref 3.5–5.1)
Sodium: 138 mmol/L (ref 135–145)

## 2018-12-15 LAB — CBC
HCT: 26.1 % — ABNORMAL LOW (ref 36.0–46.0)
Hemoglobin: 7.5 g/dL — ABNORMAL LOW (ref 12.0–15.0)
MCH: 25 pg — ABNORMAL LOW (ref 26.0–34.0)
MCHC: 28.7 g/dL — ABNORMAL LOW (ref 30.0–36.0)
MCV: 87 fL (ref 80.0–100.0)
Platelets: 257 10*3/uL (ref 150–400)
RBC: 3 MIL/uL — ABNORMAL LOW (ref 3.87–5.11)
RDW: 15.8 % — ABNORMAL HIGH (ref 11.5–15.5)
WBC: 12 10*3/uL — ABNORMAL HIGH (ref 4.0–10.5)
nRBC: 0 % (ref 0.0–0.2)

## 2018-12-15 LAB — MAGNESIUM: Magnesium: 2.3 mg/dL (ref 1.7–2.4)

## 2018-12-15 LAB — PROTIME-INR
INR: 5.15
Prothrombin Time: 46.7 s — ABNORMAL HIGH (ref 11.4–15.2)

## 2018-12-15 MED ORDER — SILENOR 6 MG PO TABS: 3.0000 mg | ORAL_TABLET | Freq: Every day | ORAL | 0 refills | Status: DC

## 2018-12-15 MED ORDER — ACETAMINOPHEN 325 MG PO TABS: 325.0000 mg | ORAL_TABLET | Freq: Four times a day (QID) | ORAL | Status: DC | PRN

## 2018-12-15 MED ORDER — ALPRAZOLAM 0.5 MG PO TABS: 0.5000 mg | ORAL_TABLET | Freq: Every day | ORAL | 0 refills | Status: DC

## 2018-12-15 MED ORDER — MIRTAZAPINE 15 MG PO TABS: 7.5000 mg | ORAL_TABLET | Freq: Every day | ORAL | 0 refills | Status: DC

## 2018-12-15 NOTE — Discharge Summary (Addendum)
Physician Discharge Summary  [name] [name] EGB:[id number] DOB: Aug 06, 1921 DOA: 12/11/2018  PCP: Hennie Duos, MD  Admit date: 12/11/2018 Discharge date: 12/15/2018  Time spent: 39 minutes  Recommendations for Outpatient Follow-up:  1. Recommend CBC, INR, Chem-12 in 1 week 2. Hold coumadin for 48 hours then resume and check frequently-INR was 5 on d/c 3. Up with therapy and follow-up with Dr. Alvan Dame as an outpatient regarding postop management 2 weeks from time of discharge 4. Careful for sedation with regards to multiple meds with that potential 5. Consider vitamin D levels as an outpatient  Discharge Diagnoses:  Principal Problem:   Closed left hip fracture, initial encounter ([place]) Active Problems:   CAD (coronary artery disease)   HTN (hypertension)   PMR (polymyalgia rheumatica) (HCC)   History of pulmonary embolus (PE)   CKD (chronic kidney disease), stage III (HCC)   Depression with anxiety   Discharge Condition: Improved  Diet recommendation: Heart healthy low-salt  Filed Weights   12/11/18 2242  Weight: 57.2 kg    History of present illness:  85 year old  anxiety/depression,  coronary artery disease,  polymyalgia rheumatica,  DVT on Coumadin, dementia and essential hypertension   came to the hospital from skilled nursing facility with complaints of left severe hip pain on 2/10  She was found to have left intertrochanteric femoral neck fracture.  Orthopedic was consulted.  She was given vitamin K due to elevated INR.  Hospital Course:  Left intertrochanteric hip fracture -Orthopedic following and performed surgery 2/12 -Pain control, PT/OT thereafter. - cleared by orthopedic, Coumadin resumedAnd will need recheck in 4 to 7 days Stable for skilled facility discharge  History of DVT/pulmonary embolism -No current evidence of this at this time -INR subtherapeutic prior to discharge needs recheck in 4 to 7 days  History of polymyalgia  rheumatica -Currently stable on low-dose prednisone.  We will continue this for now.   Coronary artery disease -Currently home oral medications on hold.  Holding antihypertensives for now until the surgery.    Resumed losartan on discharge  Essential hypertension -Continue Norvasc and hydralazine.    History of depression, anxiety and dementia -some risk becoming delirious in the hospital.  She is aware of this.  Will use basic delirium precaution if necessary.  In the meantime continue her home medications including Aricept, doxepin, Remeron and Xanax. -She was clear mentally on time of discharge  Chronic kidney disease stage III -Renal function appears to be stable. -rpt Chem-12 as an outpatient  Consultants:   Orthopedic  Procedures:   IM nail placement 12/13/2018  Discharge Exam: Vitals:   12/14/18 2156 12/15/18 0500  BP: (!) 128/52 (!) 157/58  Pulse: 60 64  Resp:  17  Temp: 97.6 F (36.4 C) (!) 97.5 F (36.4 C)  SpO2: 97% 99%    General: Awake alert coherent 7 some amount of pain otherwise is stable no distress Nursing reports needed catheterization but does have sensation to void Cardiovascular: S1-S2 no murmur rub or gallop Respiratory: Clinically clear no added sound no rales no rhonchi Abdomen soft nontender no rebound no guarding  Discharge Instructions   Discharge Instructions    Diet - low sodium heart healthy   Complete by:  As directed    Increase activity slowly   Complete by:  As directed      Allergies as of 12/15/2018      Reactions   Ambien [zolpidem Tartrate] Nausea And Vomiting   Codeine Phosphate Nausea And Vomiting   Darvocet [  propoxyphene N-acetaminophen] Nausea And Vomiting   Fish-derived Products    Promethazine Hcl Nausea And Vomiting   Sulfamethoxazole Other (See Comments)   Pt doesn't remember reaction   Vicodin [hydrocodone-acetaminophen] Other (See Comments)   Unknown reaction   Amoxicillin Nausea And Vomiting, Rash    Penicillins Nausea And Vomiting, Rash      Medication List    STOP taking these medications   bisacodyl 10 MG suppository Commonly known as:  DULCOLAX   hydrALAZINE 25 MG tablet Commonly known as:  APRESOLINE   potassium chloride SA 20 MEQ tablet Commonly known as:  K-DUR,KLOR-CON     TAKE these medications   acetaminophen 325 MG tablet Commonly known as:  TYLENOL Take 1-2 tablets (325-650 mg total) by mouth every 6 (six) hours as needed for mild pain (pain score 1-3 or temp > 100.5).   ALPRAZolam 0.5 MG tablet Commonly known as:  XANAX Take 1 tablet (0.5 mg total) by mouth daily.   amLODipine 10 MG tablet Commonly known as:  NORVASC Take 10 mg by mouth every morning.   aspirin EC 81 MG tablet Take 1 tablet (81 mg total) by mouth daily.   atorvastatin 10 MG tablet Commonly known as:  LIPITOR Take 10 mg by mouth every evening.   cholecalciferol 1000 units tablet Commonly known as:  VITAMIN D Take 2,000 Units by mouth daily with lunch. 2 tabs   donepezil 10 MG tablet Commonly known as:  ARICEPT Take 10 mg by mouth every evening.   ENEMA RE Place 1 enema rectally daily as needed (constipation).   losartan 50 MG tablet Commonly known as:  COZAAR Take 50 mg by mouth at bedtime.   magnesium hydroxide 400 MG/5ML suspension Commonly known as:  MILK OF MAGNESIA Take 30 mLs by mouth daily as needed for mild constipation.   mirtazapine 15 MG tablet Commonly known as:  REMERON Take 0.5 tablets (7.5 mg total) by mouth at bedtime.   nitroGLYCERIN 0.4 MG SL tablet Commonly known as:  NITROSTAT Place 0.4 mg under the tongue every 5 (five) minutes as needed for chest pain.   ondansetron 4 MG tablet Commonly known as:  ZOFRAN Take 4 mg by mouth every 4 (four) hours as needed for nausea.   oxybutynin 5 MG tablet Commonly known as:  DITROPAN Take 5 mg by mouth 2 (two) times daily.   polyethylene glycol packet Commonly known as:  MIRALAX / GLYCOLAX Take 17 g by  mouth daily as needed for moderate constipation.   predniSONE 1 MG tablet Commonly known as:  DELTASONE Take 1 mg by mouth daily with breakfast.   PROAIR HFA 108 (90 Base) MCG/ACT inhaler Generic drug:  albuterol Inhale 2 puffs into the lungs every 6 (six) hours as needed for wheezing or shortness of breath.   rOPINIRole 1 MG tablet Commonly known as:  REQUIP Take 1 mg by mouth at bedtime. 1mg  at noon, 1mg  2 hours before bed, then 1mg  at bedtime   SILENOR 6 MG Tabs Generic drug:  Doxepin HCl Take 0.5 tablets (3 mg total) by mouth at bedtime. What changed:  how much to take   traMADol 50 MG tablet Commonly known as:  ULTRAM Take 50 mg by mouth at bedtime.   TUMS 500 MG chewable tablet Generic drug:  calcium carbonate Chew 1 tablet by mouth 3 (three) times daily as needed for indigestion or heartburn.   warfarin 3 MG tablet Commonly known as:  COUMADIN Take 3 mg by mouth daily.  Allergies  Allergen Reactions  . Ambien [Zolpidem Tartrate] Nausea And Vomiting  . Codeine Phosphate Nausea And Vomiting  . Darvocet [Propoxyphene N-Acetaminophen] Nausea And Vomiting  . Fish-Derived Products   . Promethazine Hcl Nausea And Vomiting  . Sulfamethoxazole Other (See Comments)    Pt doesn't remember reaction  . Vicodin [Hydrocodone-Acetaminophen] Other (See Comments)    Unknown reaction  . Amoxicillin Nausea And Vomiting and Rash  . Penicillins Nausea And Vomiting and Rash   Follow-up Information    Paralee Cancel, MD Follow up in 2 week(s).   Specialty:  Orthopedic Surgery Why:  for wound check and X-rays Contact information: [address] [place] [address] [phone]            The results of significant diagnostics from this hospitalization (including imaging, microbiology, ancillary and laboratory) are listed below for reference.    Significant Diagnostic Studies: Dg Lumbar Spine Complete  Result Date: 12/11/2018 CLINICAL DATA:  Recent  fall with low back pain EXAM: LUMBAR SPINE - COMPLETE 4+ VIEW COMPARISON:  03/08/2017 FINDINGS: Stable compression deformities of L2 and L3 are noted. Degenerative changes are noted worst at L4-5 facet hypertrophic changes are noted. No acute bony abnormality is seen. IMPRESSION: Chronic degenerative changes and compression fractures without acute abnormality. Electronically Signed   By: Inez Catalina M.D.   On: 12/11/2018 22:28   Dg Pelvis 1-2 Views  Result Date: 12/11/2018 CLINICAL DATA:  Recent fall with left leg pain, initial encounter EXAM: PELVIS - 1-2 VIEW COMPARISON:  None. FINDINGS: Pelvic ring is intact. Comminuted intratrochanteric left femoral fracture is noted with impaction and mild angulation at the fracture site. Femoral head is well seated. IMPRESSION: Comminuted intratrochanteric left femoral fracture. Electronically Signed   By: Inez Catalina M.D.   On: 12/11/2018 22:27   Ct Head Wo Contrast  Result Date: 12/11/2018 CLINICAL DATA:  Recent fall EXAM: CT HEAD WITHOUT CONTRAST CT CERVICAL SPINE WITHOUT CONTRAST TECHNIQUE: Multidetector CT imaging of the head and cervical spine was performed following the standard protocol without intravenous contrast. Multiplanar CT image reconstructions of the cervical spine were also generated. COMPARISON:  11/14/15 FINDINGS: CT HEAD FINDINGS Brain: Chronic white matter ischemic changes are noted. No findings to suggest acute hemorrhage, acute infarction or space-occupying mass lesion are noted. Dense lesion is again identified within the sella stable from the prior exam. Vascular: No hyperdense vessel or unexpected calcification. Skull: Normal. Negative for fracture or focal lesion. Sinuses/Orbits: No acute finding. Other: None CT CERVICAL SPINE FINDINGS Alignment: Within normal limits. Skull base and vertebrae: 7 cervical segments are well visualized. Vertebral body height is well maintained. No acute fracture or acute facet abnormality is noted. Facet  hypertrophic changes are seen. No significant neural foraminal narrowing is noted. Soft tissues and spinal canal: Surrounding soft tissues are unremarkable. Upper chest: Visualized upper lung fields are within normal limits. Other: None IMPRESSION: CT of the head: No change from the prior exam. No acute abnormality noted. CT of the cervical spine: Multilevel degenerative change without acute abnormality. Electronically Signed   By: Inez Catalina M.D.   On: 12/11/2018 22:16   Ct Cervical Spine Wo Contrast  Result Date: 12/11/2018 CLINICAL DATA:  Recent fall EXAM: CT HEAD WITHOUT CONTRAST CT CERVICAL SPINE WITHOUT CONTRAST TECHNIQUE: Multidetector CT imaging of the head and cervical spine was performed following the standard protocol without intravenous contrast. Multiplanar CT image reconstructions of the cervical spine were also generated. COMPARISON:  11/14/15 FINDINGS: CT HEAD FINDINGS Brain:  Chronic white matter ischemic changes are noted. No findings to suggest acute hemorrhage, acute infarction or space-occupying mass lesion are noted. Dense lesion is again identified within the sella stable from the prior exam. Vascular: No hyperdense vessel or unexpected calcification. Skull: Normal. Negative for fracture or focal lesion. Sinuses/Orbits: No acute finding. Other: None CT CERVICAL SPINE FINDINGS Alignment: Within normal limits. Skull base and vertebrae: 7 cervical segments are well visualized. Vertebral body height is well maintained. No acute fracture or acute facet abnormality is noted. Facet hypertrophic changes are seen. No significant neural foraminal narrowing is noted. Soft tissues and spinal canal: Surrounding soft tissues are unremarkable. Upper chest: Visualized upper lung fields are within normal limits. Other: None IMPRESSION: CT of the head: No change from the prior exam. No acute abnormality noted. CT of the cervical spine: Multilevel degenerative change without acute abnormality. Electronically  Signed   By: Inez Catalina M.D.   On: 12/11/2018 22:16   Chest Portable 1 View  Result Date: 12/12/2018 CLINICAL DATA:  Preop fractured hip EXAM: PORTABLE CHEST 1 VIEW COMPARISON:  03/11/2017 FINDINGS: Elevation of the right hemidiaphragm. No confluent airspace opacities or effusions. Heart is normal size. No acute bony abnormality. IMPRESSION: Stable chronic elevation of the right hemidiaphragm. No active disease. Electronically Signed   By: Rolm Baptise M.D.   On: 12/12/2018 00:30   Dg C-arm 1-60 Min-no Report  Result Date: 12/13/2018 Fluoroscopy was utilized by the requesting physician.  No radiographic interpretation.   Dg Femur Min 2 Views Left  Result Date: 12/11/2018 CLINICAL DATA:  Recent fall with left leg pain, initial encounter EXAM: LEFT FEMUR 2 VIEWS COMPARISON:  None. FINDINGS: Comminuted left intratrochanteric femoral fracture is noted. Some impaction and angulation at the fracture site is noted. No other fracture is seen. IMPRESSION: Left intratrochanteric femoral fracture. Electronically Signed   By: Inez Catalina M.D.   On: 12/11/2018 22:29   Dg Femur, Min 2 Views Right  Result Date: 12/13/2018 CLINICAL DATA:  Status post surgical fixation of proximal left femoral fracture. EXAM: RIGHT FEMUR 2 VIEWS Radiation exposure index: 6.5328 mGy. COMPARISON:  Radiograph of December 11, 2018. FINDINGS: Four intraoperative fluoroscopic images demonstrate surgical internal fixation of proximal left femoral inter trochanteric fracture. Good alignment of fracture components is noted. IMPRESSION: Status post surgical internal fixation of proximal left femoral intertrochanteric fracture. Electronically Signed   By: Marijo Conception, M.D.   On: 12/13/2018 14:59   [place] Ekg Site Rite  Result Date: 12/12/2018 If Site Rite image not attached, placement could not be confirmed due to current cardiac rhythm.   Microbiology: Recent Results (from the past 240 hour(s))  MRSA PCR Screening     Status: None    Collection Time: 12/12/18 11:14 AM  Result Value Ref Range Status   MRSA by PCR NEGATIVE NEGATIVE Final    Comment:        The GeneXpert MRSA Assay (FDA approved for NASAL specimens only), is one component of a comprehensive MRSA colonization surveillance program. It is not intended to diagnose MRSA infection nor to guide or monitor treatment for MRSA infections. Performed at [facility], [place] [address], [place], [place] [address]      Labs: Basic Metabolic Panel: Recent Labs  Lab 12/11/18 2308 12/12/18 0834 12/13/18 0316 12/14/18 0536  NA 137 135 139 138  K 4.2 3.9 3.8 3.9  CL 105 104 108 110  CO2 22 22 22 22   GLUCOSE 123* 132* 118* 144*  BUN  20 18 20  25*  CREATININE 1.11* 0.89 0.90 1.13*  CALCIUM 8.8* 8.7* 8.5* 8.3*  MG  --  2.2 2.1 2.1   Liver Function Tests: No results for input(s): AST, ALT, ALKPHOS, BILITOT, PROT, ALBUMIN in the last 168 hours. No results for input(s): LIPASE, AMYLASE in the last 168 hours. No results for input(s): AMMONIA in the last 168 hours. CBC: Recent Labs  Lab 12/11/18 2308 12/12/18 0834 12/13/18 0316 12/14/18 0536  WBC 14.6* 13.2* 13.0* 11.5*  NEUTROABS 12.6*  --   --   --   HGB 11.0* 11.0* 10.0* 9.1*  HCT 36.4 37.1 32.9* 29.5*  MCV 83.7 84.3 83.1 80.2  PLT 311 310 283 220   Cardiac Enzymes: No results for input(s): CKTOTAL, CKMB, CKMBINDEX, TROPONINI in the last 168 hours. BNP: BNP (last 3 results) No results for input(s): BNP in the last 8760 hours.  ProBNP (last 3 results) No results for input(s): PROBNP in the last 8760 hours.  CBG: No results for input(s): GLUCAP in the last 168 hours.     Signed:  Nita Sells MD   [facility] 12/15/2018, 11:39 AM

## 2018-12-15 NOTE — Progress Notes (Addendum)
Patient BC[employer]-Medicare Josem Kaufmann still pending. Weekend CSW will follow up with the SNF in the am.  CSW notified nurse and son [name].   Kathrin Greathouse, Marlinda Mike, MSW Clinical Social Worker  [phone] 12/15/2018  5:04 PM

## 2018-12-16 LAB — CBC
HCT: 24.5 % — ABNORMAL LOW (ref 36.0–46.0)
Hemoglobin: 7.3 g/dL — ABNORMAL LOW (ref 12.0–15.0)
MCH: 24.8 pg — AB (ref 26.0–34.0)
MCHC: 29.8 g/dL — ABNORMAL LOW (ref 30.0–36.0)
MCV: 83.3 fL (ref 80.0–100.0)
Platelets: 282 10*3/uL (ref 150–400)
RBC: 2.94 MIL/uL — ABNORMAL LOW (ref 3.87–5.11)
RDW: 15.7 % — ABNORMAL HIGH (ref 11.5–15.5)
WBC: 7.4 10*3/uL (ref 4.0–10.5)
nRBC: 0.3 % — ABNORMAL HIGH (ref 0.0–0.2)

## 2018-12-16 LAB — BASIC METABOLIC PANEL
Anion gap: 6 (ref 5–15)
BUN: 21 mg/dL (ref 8–23)
CO2: 22 mmol/L (ref 22–32)
CREATININE: 0.86 mg/dL (ref 0.44–1.00)
Calcium: 7.8 mg/dL — ABNORMAL LOW (ref 8.9–10.3)
Chloride: 108 mmol/L (ref 98–111)
GFR calc Af Amer: >60 mL/min (ref >60)
GFR calc non Af Amer: 57 mL/min — ABNORMAL LOW (ref >60)
Glucose, Bld: 96 mg/dL (ref 70–99)
Potassium: 4 mmol/L (ref 3.5–5.1)
Sodium: 136 mmol/L (ref 135–145)

## 2018-12-16 LAB — PROTIME-INR
INR: 6.96
Prothrombin Time: 58.9 seconds — ABNORMAL HIGH (ref 11.4–15.2)

## 2018-12-16 LAB — PREPARE RBC (CROSSMATCH)

## 2018-12-16 LAB — MAGNESIUM: Magnesium: 2.2 mg/dL (ref 1.7–2.4)

## 2018-12-16 MED ORDER — ACETAMINOPHEN 325 MG PO TABS
650.0000 mg | ORAL_TABLET | Freq: Once | ORAL | Status: AC
  Administered 2018-12-16: 650 mg via ORAL
  Filled 2018-12-16: qty 2

## 2018-12-16 MED ORDER — KETOROLAC TROMETHAMINE 15 MG/ML IJ SOLN
15.0000 mg | Freq: Once | INTRAMUSCULAR | Status: AC
  Administered 2018-12-16: 15 mg via INTRAVENOUS
  Filled 2018-12-16: qty 1

## 2018-12-16 MED ORDER — FUROSEMIDE 10 MG/ML IJ SOLN
20.0000 mg | Freq: Once | INTRAMUSCULAR | Status: AC
  Administered 2018-12-16: 20 mg via INTRAVENOUS
  Filled 2018-12-16: qty 2

## 2018-12-16 MED ORDER — PHYTONADIONE 5 MG PO TABS
2.5000 mg | ORAL_TABLET | Freq: Once | ORAL | Status: AC
  Administered 2018-12-16: 2.5 mg via ORAL
  Filled 2018-12-16: qty 1

## 2018-12-16 MED ORDER — DIPHENHYDRAMINE HCL 25 MG PO CAPS
25.0000 mg | ORAL_CAPSULE | Freq: Once | ORAL | Status: AC
  Administered 2018-12-16: 25 mg via ORAL
  Filled 2018-12-16: qty 1

## 2018-12-16 MED ORDER — SODIUM CHLORIDE 0.9% IV SOLUTION
Freq: Once | INTRAVENOUS | Status: AC
  Administered 2018-12-16: 09:00:00 via INTRAVENOUS

## 2018-12-16 MED ORDER — SENNOSIDES-DOCUSATE SODIUM 8.6-50 MG PO TABS
1.0000 | ORAL_TABLET | Freq: Every day | ORAL | Status: DC
  Administered 2018-12-16 – 2018-12-17 (×2): 1 via ORAL
  Filled 2018-12-16 (×2): qty 1

## 2018-12-16 NOTE — Progress Notes (Addendum)
PROGRESS NOTE    [name] [name]  XKG:[id number] DOB: 1920/11/07 DOA: 12/11/2018 PCP: Hennie Duos, MD   Brief Narrative:  85 year old  anxiety/depression,  coronary artery disease,  polymyalgia rheumatica,  DVT on Coumadin, dementia and essential hypertension   came to the hospital from skilled nursing facility with complaints of left severe hip pain on 2/10  She was found to have left intertrochanteric femoral neck fracture.  Orthopedic was consulted.  She was given vitamin K due to elevated INR.   Assessment & Plan:   Principal Problem:   Closed left hip fracture, initial encounter [facility]) Active Problems:   CAD (coronary artery disease)   HTN (hypertension)   PMR (polymyalgia rheumatica) (HCC)   History of pulmonary embolus (PE)   CKD (chronic kidney disease), stage III (HCC)   Depression with anxiety  Left intertrochanteric hip fracture -Orthopedic following and performed surgery 2/12 -Pain control, PT/OT thereafter. - cleared by orthopedic, Coumadin resumed -Can be WBAT per ortho -Pain reasonably controlled taking mainly oral tramadol has as needed morphine  Anemia of expected blood loss -Patient will be transfused 1 unit of packed red blood cells although no overt source of bleeding -She has not had a stool and I doubt that she has lower GI bleed rather she may have some component of dilution We will recheck labs in the morning  History of DVT/pulmonary embolism -No current evidence of this at this time -INR level is somewhat supratherapeutic in the 6 range and I feel it is reasonable to give 2.5 vitamin K despite no bleeding as she has dropped her hematocrit which may be post surgical and possibly dilutional -Recheck a.m. labs  History of polymyalgia rheumatica -Currently stable on low-dose prednisone.  We will continue this for now.   Coronary artery disease -Currently home oral medications on hold.   Essential hypertension -Continue Norvasc and  hydralazine. -Blood pressures have trended up since admission so did resume amlodipine 10  History of depression, anxiety and dementia -Patient is a high risk of becoming delirious in the hospital.  She is aware of this.  She is slightly confused however not significant enough to cause further issues-I will discontinue her Xanax -Continue Sinequan 6 at bedtime Remeron 7.5 daily and Aricept 10 nightly  Chronic kidney disease stage III -Renal function appears to be stable. -Repeat a.m. labs  Constipation -Has not had a stool for the past couple of days continue bowel regimen with MiraLAX discontinue Dulcolax Colace -Adding senna if no resolved with MiraLAX  DVT prophylaxis: coumadin at this time Code Status: Patient is DNR. Family Communication:  Disposition Plan: Maintain inpatient stay until the surgery  Consultants:   Orthopedic  Procedures:   None thus far  Antimicrobials:   None   Subjective:  Just back from surgery  In nad Hung[place] Slight HOH Review of Systems Otherwise negative except as per HPI, including: General: Denies fever, chills, night sweats or unintended weight loss.  Objective: Vitals:   12/15/18 1424 12/15/18 1819 12/15/18 2250 12/16/18 0513  BP: (!) 151/48 (!) 142/60 140/61 (!) 150/60  Pulse: (!) 50 66 65 76  Resp: 16 14 15    Temp:  97.7 F (36.5 C) 97.8 F (36.6 C) 98.9 F (37.2 C)  TempSrc:  Oral Oral Oral  SpO2: 97% 96% 96% 95%  Weight:      Height:        Intake/Output Summary (Last 24 hours) at 12/16/2018 1004 Last data filed at 12/16/2018 0940 Gross per 24 hour  Intake 660 ml  Output 801 ml  Net -141 ml   Filed Weights   12/11/18 2242  Weight: 57.2 kg    Examination:  Awake pleasant slightly disoriented but does correct herself Chest clinically clear no added sound Abdomen soft no rebound no guarding No lower extremity edema but does have some pain in the hip No icterus no pallor No submandibular  lymphadenopathy    Data Reviewed:   CBC: Recent Labs  Lab 12/11/18 2308 12/12/18 0834 12/13/18 0316 12/14/18 0536 12/15/18 0911 12/16/18 0611  WBC 14.6* 13.2* 13.0* 11.5* 12.0* 7.4  NEUTROABS 12.6*  --   --   --   --   --   HGB 11.0* 11.0* 10.0* 9.1* 7.5* 7.3*  HCT 36.4 37.1 32.9* 29.5* 26.1* 24.5*  MCV 83.7 84.3 83.1 80.2 87.0 83.3  PLT 311 310 283 220 257 478   Basic Metabolic Panel: Recent Labs  Lab 12/12/18 0834 12/13/18 0316 12/14/18 0536 12/15/18 0911 12/16/18 0611  NA 135 139 138 138 136  K 3.9 3.8 3.9 3.4* 4.0  CL 104 108 110 108 108  CO2 22 22 22 23 22   GLUCOSE 132* 118* 144* 85 96  BUN 18 20 25* 25* 21  CREATININE 0.89 0.90 1.13* 0.81 0.86  CALCIUM 8.7* 8.5* 8.3* 7.8* 7.8*  MG 2.2 2.1 2.1 2.3 2.2   GFR: Estimated Creatinine Clearance: 30.9 mL/min (by C-G formula based on SCr of 0.86 mg/dL). Liver Function Tests: No results for input(s): AST, ALT, ALKPHOS, BILITOT, PROT, ALBUMIN in the last 168 hours. No results for input(s): LIPASE, AMYLASE in the last 168 hours. No results for input(s): AMMONIA in the last 168 hours. Coagulation Profile: Recent Labs  Lab 12/12/18 1257 12/13/18 0316 12/14/18 0536 12/15/18 0911 12/16/18 0611  INR 2.46 1.74 1.89 5.15* 6.96*   Cardiac Enzymes: No results for input(s): CKTOTAL, CKMB, CKMBINDEX, TROPONINI in the last 168 hours. BNP (last 3 results) No results for input(s): PROBNP in the last 8760 hours. HbA1C: No results for input(s): HGBA1C in the last 72 hours. CBG: No results for input(s): GLUCAP in the last 168 hours. Lipid Profile: No results for input(s): CHOL, HDL, LDLCALC, TRIG, CHOLHDL, LDLDIRECT in the last 72 hours. Thyroid Function Tests: No results for input(s): TSH, T4TOTAL, FREET4, T3FREE, THYROIDAB in the last 72 hours. Anemia Panel: No results for input(s): VITAMINB12, FOLATE, FERRITIN, TIBC, IRON, RETICCTPCT in the last 72 hours. Sepsis Labs: No results for input(s): PROCALCITON,  LATICACIDVEN in the last 168 hours.  Recent Results (from the past 240 hour(s))  MRSA PCR Screening     Status: None   Collection Time: 12/12/18 11:14 AM  Result Value Ref Range Status   MRSA by PCR NEGATIVE NEGATIVE Final    Comment:        The GeneXpert MRSA Assay (FDA approved for NASAL specimens only), is one component of a comprehensive MRSA colonization surveillance program. It is not intended to diagnose MRSA infection nor to guide or monitor treatment for MRSA infections. Performed at [facility], [place] [address], [place], [place] [address]          Radiology Studies: No results found.      Scheduled Meds: . ALPRAZolam  0.5 mg Oral Daily  . amLODipine  10 mg Oral q morning - 10a  . atorvastatin  10 mg Oral QPM  . docusate sodium  100 mg Oral BID  . donepezil  10 mg Oral QPM  . doxepin  6 mg Oral  QHS  . mirtazapine  7.5 mg Oral QHS  . oxybutynin  5 mg Oral BID  . phytonadione  2.5 mg Oral Once  . predniSONE  1 mg Oral Q breakfast  . rOPINIRole  1 mg Oral QHS  . traMADol  50 mg Oral Q6H  . Warfarin - Pharmacist Dosing Inpatient   Does not apply q1800   Continuous Infusions: . sodium chloride Stopped (12/15/18 0909)  . methocarbamol (ROBAXIN) IV Stopped (12/13/18 1513)     LOS: 5 days   Time spent  25 mins    Nita Sells, MD [facility]  If 7PM-7AM, please contact night-coverage [email or web address] 12/16/2018, 10:04 AM

## 2018-12-16 NOTE — Plan of Care (Signed)
  Problem: Activity: Goal: Risk for activity intolerance will decrease Outcome: Progressing   Problem: Elimination: Goal: Will not experience complications related to urinary retention Outcome: Progressing   

## 2018-12-16 NOTE — Progress Notes (Signed)
ANTICOAGULATION CONSULT NOTE - [place] for warfarin Indication: pulmonary embolus and VTE prophylaxis  Allergies  Allergen Reactions  . Ambien [Zolpidem Tartrate] Nausea And Vomiting  . Codeine Phosphate Nausea And Vomiting  . Darvocet [Propoxyphene N-Acetaminophen] Nausea And Vomiting  . Fish-Derived Products   . Promethazine Hcl Nausea And Vomiting  . Sulfamethoxazole Other (See Comments)    Pt doesn't remember reaction  . Vicodin [Hydrocodone-Acetaminophen] Other (See Comments)    Unknown reaction  . Amoxicillin Nausea And Vomiting and Rash  . Penicillins Nausea And Vomiting and Rash    Patient Measurements: Height: 5\' 2"  (157.5 cm) Weight: 126 lb (57.2 kg) IBW/kg (Calculated) : 50.1  Vital Signs: Temp: 98.9 F (37.2 C) (02/15 0513) Temp Source: Oral (02/15 0513) BP: 150/60 (02/15 0513) Pulse Rate: 76 (02/15 0513)  Labs: Recent Labs    12/14/18 0536 12/15/18 0911 12/16/18 0611  HGB 9.1* 7.5* 7.3*  HCT 29.5* 26.1* 24.5*  PLT 220 257 282  LABPROT 21.5* 46.7* 58.9*  INR 1.89 5.15* 6.96*  CREATININE 1.13* 0.81 0.86    Estimated Creatinine Clearance: 30.9 mL/min (by C-G formula based on SCr of 0.86 mg/dL).   Medical History: Past Medical History:  Diagnosis Date  . Anemia 03/02/2012  . Angina pectoris, unstable ([place]) 03/02/2012  . Anxiety   . Axillary abscess   . Bradycardia 06/13/2012  . CAD (coronary artery disease)    S/p PTCA / stenting (last cath 2004, multiple LAD stents, 2 stents in the right coronary artery all patent)  . Cancer ([place])   . Complication of anesthesia   . Compression fracture of L2 ([place]) 03/12/2017  . Dementia ([place])   . Dysphagia 10/06/2012  . Dyspnea 02/03/2012  . GERD (gastroesophageal reflux disease)   . History of pulmonary embolus (PE) 03/19/2017  . HTN (hypertension)   . PMR (polymyalgia rheumatica) (HCC)   . PONV (postoperative nausea and vomiting)   . Restless leg syndrome   . VTE (venous  thromboembolism) 10/2010   DVT and PE. Started coumadin  . Weakness of both legs 06/09/2012    Medications:  Scheduled:  . ALPRAZolam  0.5 mg Oral Daily  . amLODipine  10 mg Oral q morning - 10a  . atorvastatin  10 mg Oral QPM  . docusate sodium  100 mg Oral BID  . donepezil  10 mg Oral QPM  . doxepin  6 mg Oral QHS  . mirtazapine  7.5 mg Oral QHS  . oxybutynin  5 mg Oral BID  . phytonadione  2.5 mg Oral Once  . predniSONE  1 mg Oral Q breakfast  . rOPINIRole  1 mg Oral QHS  . traMADol  50 mg Oral Q6H  . Warfarin - Pharmacist Dosing Inpatient   Does not apply q1800   Infusions:  . sodium chloride Stopped (12/15/18 0909)  . methocarbamol (ROBAXIN) IV Stopped (12/13/18 1513)    Assessment: 85 yo female POD3 ORIF left hip fracture. To resume warfarin post op per orders.  Patient on warfarin prior to admission for hx PE at a dose of 3 mg daily. INR (2.8) was therapeutic on admission.  Significant Events:  2/11: vitamin K 10 mg PO given 2/12: Surgery - intramedullary IM nail left femoral  Today, 12/16/18   INR 6.96 is supratherapeutic. Increased.    Last dose of warfarin given on 2/13  Hgb 7.3 is low. Decreased post-op. Stable compared to yesterday.  Plt (282) - stable, WNL  Per discussion with RN, no signs/symptoms  of bleeding. MD informed of elevated INR.   Goal of Therapy:  INR 2-3   Plan:   Continue to HOLD warfarin  Vitamin K 2.5 mg PO once ordered by MD  Monitor closely for signs/symptoms of bleeding or thrombosis  Continue with daily INR and CBC checks  Lenis Noon, PharmD 12/16/18 9:44 AM

## 2018-12-17 LAB — CBC WITH DIFFERENTIAL/PLATELET
Abs Immature Granulocytes: 0.17 10*3/uL — ABNORMAL HIGH (ref 0.00–0.07)
Basophils Absolute: 0 10*3/uL (ref 0.0–0.1)
Basophils Relative: 0 %
EOS PCT: 5 %
Eosinophils Absolute: 0.4 10*3/uL (ref 0.0–0.5)
HCT: 31.3 % — ABNORMAL LOW (ref 36.0–46.0)
Hemoglobin: 9.4 g/dL — ABNORMAL LOW (ref 12.0–15.0)
Immature Granulocytes: 2 %
Lymphocytes Relative: 20 %
Lymphs Abs: 1.5 10*3/uL (ref 0.7–4.0)
MCH: 25 pg — AB (ref 26.0–34.0)
MCHC: 30 g/dL (ref 30.0–36.0)
MCV: 83.2 fL (ref 80.0–100.0)
MONO ABS: 0.6 10*3/uL (ref 0.1–1.0)
Monocytes Relative: 8 %
Neutro Abs: 4.7 10*3/uL (ref 1.7–7.7)
Neutrophils Relative %: 65 %
PLATELETS: 265 10*3/uL (ref 150–400)
RBC: 3.76 MIL/uL — ABNORMAL LOW (ref 3.87–5.11)
RDW: 15.5 % (ref 11.5–15.5)
WBC: 7.3 10*3/uL (ref 4.0–10.5)
nRBC: 0.3 % — ABNORMAL HIGH (ref 0.0–0.2)

## 2018-12-17 LAB — BASIC METABOLIC PANEL
Anion gap: 8 (ref 5–15)
BUN: 20 mg/dL (ref 8–23)
CO2: 22 mmol/L (ref 22–32)
Calcium: 7.7 mg/dL — ABNORMAL LOW (ref 8.9–10.3)
Chloride: 105 mmol/L (ref 98–111)
Creatinine, Ser: 0.99 mg/dL (ref 0.44–1.00)
GFR calc non Af Amer: 48 mL/min — ABNORMAL LOW (ref >60)
GFR, EST AFRICAN AMERICAN: 56 mL/min — AB (ref >60)
Glucose, Bld: 104 mg/dL — ABNORMAL HIGH (ref 70–99)
Potassium: 4.1 mmol/L (ref 3.5–5.1)
Sodium: 135 mmol/L (ref 135–145)

## 2018-12-17 LAB — PROTIME-INR
INR: 2.22
Prothrombin Time: 24.3 seconds — ABNORMAL HIGH (ref 11.4–15.2)

## 2018-12-17 NOTE — Progress Notes (Signed)
PROGRESS NOTE    [name] [name]  DGL:[id number] DOB: 09-Nov-1920 DOA: 12/11/2018 PCP: Hennie Duos, MD   Brief Narrative:  85 year old  anxiety/depression,  coronary artery disease,  polymyalgia rheumatica,  DVT on Coumadin, dementia and essential hypertension   came to the hospital from skilled nursing facility with complaints of left severe hip pain on 2/10  She was found to have left intertrochanteric femoral neck fracture.  Orthopedic was consulted.  She was given vitamin K due to elevated INR.   Assessment & Plan:   Principal Problem:   Closed left hip fracture, initial encounter ([place]) Active Problems:   CAD (coronary artery disease)   HTN (hypertension)   PMR (polymyalgia rheumatica) (HCC)   History of pulmonary embolus (PE)   CKD (chronic kidney disease), stage III ([place])   Depression with anxiety  Left intertrochanteric hip fracture -Orthopedic following and performed surgery 2/12 -Pain control, PT/OT thereafter. - cleared by orthopedic, Coumadin resumed -Can be WBAT per ortho -Pain reasonably controlled taking mainly oral tramadol has as needed morphine  Anemia of expected blood loss - transfused 1 U PRBC 2/15 -She has not had a stool and I doubt that she has lower GI bleed rather she may have some component of dilution Labs stabilized hemoglobin 7.3-->9.4  History of DVT/pulmonary embolism -No current evidence of this at this time -INR level 6-->Vit K 2.5-->2.22 range  -Recheck a.m. labs  History of polymyalgia rheumatica -Currently stable on low-dose prednisone.  We will continue this for now.   Coronary artery disease -Currently home oral medications on hold.   Essential hypertension -Continue Norvasc and hydralazine. -Blood pressures have trended up since admission so did resume amlodipine 10  History of depression, anxiety and dementia -Patient is a high risk of becoming delirious in the hospital.  She is aware of this.  She is slightly  confused however not significant enough to cause further issues-I will discontinue her Xanax -Continue Sinequan 6 at bedtime Remeron 7.5 daily and Aricept 10 nightly  Chronic kidney disease stage III -Renal function appears to be stable. -Repeat a.m. labs  Constipation -Has not had a stool for the past couple of days continue bowel regimen with MiraLAX discontinue Dulcolax Colace -stable at this time  DVT prophylaxis: coumadin at this time Code Status: Patient is DNR. Family Communication:  Disposition Plan: Maintain inpatient stay until the surgery  Consultants:   Orthopedic  Procedures:   None thus far  Antimicrobials:   None   Subjective:  Overall fair Some pain yesterday so 1 time dose toradol given No cp no fever  Mobility limited 2/2 pain   Objective: Vitals:   12/16/18 1424 12/16/18 1522 12/16/18 2159 12/17/18 0535  BP: (!) 145/48 (!) 140/47 (!) 138/54 (!) 139/59  Pulse: (!) 59 (!) 56 66 (!) 57  Resp: 16 17 16 16   Temp: 97.7 F (36.5 C) (!) 97.4 F (36.3 C) 98.4 F (36.9 C) 98.2 F (36.8 C)  TempSrc: Oral Oral Oral Oral  SpO2: 98% 96% 96% 97%  Weight:      Height:        Intake/Output Summary (Last 24 hours) at 12/17/2018 1232 Last data filed at 12/17/2018 1000 Gross per 24 hour  Intake 1299.33 ml  Output 1550 ml  Net -250.67 ml   Filed Weights   12/11/18 2242  Weight: 57.2 kg    Examination:  Awake more coherent Chest clinically clear no added sound Abdomen soft no rebound no guarding No lower extremity edema but  does have some pain in the hip on L side No icterus no pallor No submandibular lymphadenopathy    Data Reviewed:   CBC: Recent Labs  Lab 12/11/18 2308  12/13/18 0316 12/14/18 0536 12/15/18 0911 12/16/18 0611 12/17/18 0417  WBC 14.6*   < > 13.0* 11.5* 12.0* 7.4 7.3  NEUTROABS 12.6*  --   --   --   --   --  4.7  HGB 11.0*   < > 10.0* 9.1* 7.5* 7.3* 9.4*  HCT 36.4   < > 32.9* 29.5* 26.1* 24.5* 31.3*  MCV 83.7   <  > 83.1 80.2 87.0 83.3 83.2  PLT 311   < > 283 220 257 282 265   < > = values in this interval not displayed.   Basic Metabolic Panel: Recent Labs  Lab 12/12/18 0834 12/13/18 0316 12/14/18 0536 12/15/18 0911 12/16/18 0611 12/17/18 0417  NA 135 139 138 138 136 135  K 3.9 3.8 3.9 3.4* 4.0 4.1  CL 104 108 110 108 108 105  CO2 22 22 22 23 22 22   GLUCOSE 132* 118* 144* 85 96 104*  BUN 18 20 25* 25* 21 20  CREATININE 0.89 0.90 1.13* 0.81 0.86 0.99  CALCIUM 8.7* 8.5* 8.3* 7.8* 7.8* 7.7*  MG 2.2 2.1 2.1 2.3 2.2  --    GFR: Estimated Creatinine Clearance: 26.9 mL/min (by C-G formula based on SCr of 0.99 mg/dL). Liver Function Tests: No results for input(s): AST, ALT, ALKPHOS, BILITOT, PROT, ALBUMIN in the last 168 hours. No results for input(s): LIPASE, AMYLASE in the last 168 hours. No results for input(s): AMMONIA in the last 168 hours. Coagulation Profile: Recent Labs  Lab 12/13/18 0316 12/14/18 0536 12/15/18 0911 12/16/18 0611 12/17/18 0417  INR 1.74 1.89 5.15* 6.96* 2.22   Cardiac Enzymes: No results for input(s): CKTOTAL, CKMB, CKMBINDEX, TROPONINI in the last 168 hours. BNP (last 3 results) No results for input(s): PROBNP in the last 8760 hours. HbA1C: No results for input(s): HGBA1C in the last 72 hours. CBG: No results for input(s): GLUCAP in the last 168 hours. Lipid Profile: No results for input(s): CHOL, HDL, LDLCALC, TRIG, CHOLHDL, LDLDIRECT in the last 72 hours. Thyroid Function Tests: No results for input(s): TSH, T4TOTAL, FREET4, T3FREE, THYROIDAB in the last 72 hours. Anemia Panel: No results for input(s): VITAMINB12, FOLATE, FERRITIN, TIBC, IRON, RETICCTPCT in the last 72 hours. Sepsis Labs: No results for input(s): PROCALCITON, LATICACIDVEN in the last 168 hours.  Recent Results (from the past 240 hour(s))  MRSA PCR Screening     Status: None   Collection Time: 12/12/18 11:14 AM  Result Value Ref Range Status   MRSA by PCR NEGATIVE NEGATIVE Final      Comment:        The GeneXpert MRSA Assay (FDA approved for NASAL specimens only), is one component of a comprehensive MRSA colonization surveillance program. It is not intended to diagnose MRSA infection nor to guide or monitor treatment for MRSA infections. Performed at [facility], [place] [address] [place], [place] [address]          Radiology Studies: No results found.      Scheduled Meds: . amLODipine  10 mg Oral q morning - 10a  . atorvastatin  10 mg Oral QPM  . donepezil  10 mg Oral QPM  . doxepin  6 mg Oral QHS  . mirtazapine  7.5 mg Oral QHS  . oxybutynin  5 mg Oral BID  . predniSONE  1 mg Oral Q breakfast  . rOPINIRole  1 mg Oral QHS  . senna-docusate  1 tablet Oral QHS  . traMADol  50 mg Oral Q6H  . Warfarin - Pharmacist Dosing Inpatient   Does not apply q1800   Continuous Infusions: . sodium chloride Stopped (12/15/18 0909)     LOS: 6 days   Time spent  15 mins    Nita Sells, MD [facility]  If 7PM-7AM, please contact night-coverage [email or web address] 12/17/2018, 12:32 PM

## 2018-12-17 NOTE — Plan of Care (Signed)
  Problem: Clinical Measurements: Goal: Ability to maintain clinical measurements within normal limits will improve Outcome: Progressing Goal: Diagnostic test results will improve Outcome: Progressing   Problem: Activity: Goal: Risk for activity intolerance will decrease Outcome: Progressing   

## 2018-12-17 NOTE — Progress Notes (Signed)
ANTICOAGULATION CONSULT NOTE - [place] for warfarin Indication: pulmonary embolus and VTE prophylaxis  Allergies  Allergen Reactions  . Ambien [Zolpidem Tartrate] Nausea And Vomiting  . Codeine Phosphate Nausea And Vomiting  . Darvocet [Propoxyphene N-Acetaminophen] Nausea And Vomiting  . Fish-Derived Products   . Promethazine Hcl Nausea And Vomiting  . Sulfamethoxazole Other (See Comments)    Pt doesn't remember reaction  . Vicodin [Hydrocodone-Acetaminophen] Other (See Comments)    Unknown reaction  . Amoxicillin Nausea And Vomiting and Rash  . Penicillins Nausea And Vomiting and Rash    Patient Measurements: Height: 5\' 2"  (157.5 cm) Weight: 126 lb (57.2 kg) IBW/kg (Calculated) : 50.1  Vital Signs: Temp: 98.2 F (36.8 C) (02/16 1414) Temp Source: Oral (02/16 1414) BP: 133/49 (02/16 1414) Pulse Rate: 62 (02/16 1414)  Labs: Recent Labs    12/15/18 0911 12/16/18 0611 12/17/18 0417  HGB 7.5* 7.3* 9.4*  HCT 26.1* 24.5* 31.3*  PLT 257 282 265  LABPROT 46.7* 58.9* 24.3*  INR 5.15* 6.96* 2.22  CREATININE 0.81 0.86 0.99    Estimated Creatinine Clearance: 26.9 mL/min (by C-G formula based on SCr of 0.99 mg/dL).   Medical History: Past Medical History:  Diagnosis Date  . Anemia 03/02/2012  . Angina pectoris, unstable ([place]) 03/02/2012  . Anxiety   . Axillary abscess   . Bradycardia 06/13/2012  . CAD (coronary artery disease)    S/p PTCA / stenting (last cath 2004, multiple LAD stents, 2 stents in the right coronary artery all patent)  . Cancer ([place])   . Complication of anesthesia   . Compression fracture of L2 ([place]) 03/12/2017  . Dementia ([place])   . Dysphagia 10/06/2012  . Dyspnea 02/03/2012  . GERD (gastroesophageal reflux disease)   . History of pulmonary embolus (PE) 03/19/2017  . HTN (hypertension)   . PMR (polymyalgia rheumatica) (HCC)   . PONV (postoperative nausea and vomiting)   . Restless leg syndrome   . VTE (venous  thromboembolism) 10/2010   DVT and PE. Started coumadin  . Weakness of both legs 06/09/2012    Medications:  Scheduled:  . amLODipine  10 mg Oral q morning - 10a  . atorvastatin  10 mg Oral QPM  . donepezil  10 mg Oral QPM  . doxepin  6 mg Oral QHS  . mirtazapine  7.5 mg Oral QHS  . oxybutynin  5 mg Oral BID  . predniSONE  1 mg Oral Q breakfast  . rOPINIRole  1 mg Oral QHS  . senna-docusate  1 tablet Oral QHS  . traMADol  50 mg Oral Q6H  . Warfarin - Pharmacist Dosing Inpatient   Does not apply q1800   Infusions:  . sodium chloride Stopped (12/15/18 6160)    Assessment: 85 yo female POD4 ORIF left hip fracture. Warfarin was resumed post-op. Pt was on warfarin PTA for hx PE.   INR (2.8) was therapeutic on admission.  PTA dose: warfarin 3 mg PO daily (confirmed on facility [facility]).  Recent INR check on 12/07/18 = 2.1 per [facility]  Significant Events:  2/11: Vitamin K 10 mg PO given 2/12: Surgery - intramedullary IM nail left femoral 2/12: Warfarin resumed  2/15: INR 7, vitamin K 2.5 mg PO given. Transfused 1 unit PRBC.  Today, 12/17/18   INR 2.22 is therapeutic.   Decreased s/p vitamin K on 2/15  Warfarin doses held on 2/14 & 2/15  Hgb 9.4 improved  Plt (265) - stable, WNL  No signs of bleeding  per discussion with RN  Per discussion with MD, pt not eating much. MD would like to hold warfarin this evening and recheck INR with AM labs tomorrow.  Goal of Therapy:  INR 2-3   Plan:   Hold warfarin this evening per discussion with MD  Monitor closely for signs/symptoms of bleeding or thrombosis  Continue with daily INR and CBC checks  Lenis Noon, PharmD 12/17/18 2:20 PM

## 2018-12-17 NOTE — Progress Notes (Signed)
[employer] insurance auth still pending. CSW confirmed with [employer] that patient is still under review.    Pricilla Holm, MSW, [facility] [facility] [phone]

## 2018-12-18 ENCOUNTER — Other Ambulatory Visit: Payer: Self-pay | Admitting: Internal Medicine

## 2018-12-18 DIAGNOSIS — S72002A Fracture of unspecified part of neck of left femur, initial encounter for closed fracture: Secondary | ICD-10-CM | POA: Diagnosis not present

## 2018-12-18 DIAGNOSIS — S72145D Nondisplaced intertrochanteric fracture of left femur, subsequent encounter for closed fracture with routine healing: Secondary | ICD-10-CM | POA: Diagnosis not present

## 2018-12-18 DIAGNOSIS — D649 Anemia, unspecified: Secondary | ICD-10-CM | POA: Diagnosis not present

## 2018-12-18 DIAGNOSIS — L8990 Pressure ulcer of unspecified site, unspecified stage: Secondary | ICD-10-CM

## 2018-12-18 DIAGNOSIS — J189 Pneumonia, unspecified organism: Secondary | ICD-10-CM | POA: Diagnosis not present

## 2018-12-18 DIAGNOSIS — R488 Other symbolic dysfunctions: Secondary | ICD-10-CM | POA: Diagnosis not present

## 2018-12-18 DIAGNOSIS — M6281 Muscle weakness (generalized): Secondary | ICD-10-CM | POA: Diagnosis not present

## 2018-12-18 DIAGNOSIS — M353 Polymyalgia rheumatica: Secondary | ICD-10-CM | POA: Diagnosis not present

## 2018-12-18 DIAGNOSIS — I69891 Dysphagia following other cerebrovascular disease: Secondary | ICD-10-CM | POA: Diagnosis not present

## 2018-12-18 DIAGNOSIS — I129 Hypertensive chronic kidney disease with stage 1 through stage 4 chronic kidney disease, or unspecified chronic kidney disease: Secondary | ICD-10-CM | POA: Diagnosis not present

## 2018-12-18 DIAGNOSIS — R2681 Unsteadiness on feet: Secondary | ICD-10-CM | POA: Diagnosis not present

## 2018-12-18 DIAGNOSIS — F0390 Unspecified dementia without behavioral disturbance: Secondary | ICD-10-CM | POA: Diagnosis not present

## 2018-12-18 DIAGNOSIS — Z7401 Bed confinement status: Secondary | ICD-10-CM | POA: Diagnosis not present

## 2018-12-18 DIAGNOSIS — R41841 Cognitive communication deficit: Secondary | ICD-10-CM | POA: Diagnosis not present

## 2018-12-18 DIAGNOSIS — F418 Other specified anxiety disorders: Secondary | ICD-10-CM | POA: Diagnosis not present

## 2018-12-18 DIAGNOSIS — I2510 Atherosclerotic heart disease of native coronary artery without angina pectoris: Secondary | ICD-10-CM | POA: Diagnosis not present

## 2018-12-18 DIAGNOSIS — Z5189 Encounter for other specified aftercare: Secondary | ICD-10-CM | POA: Diagnosis not present

## 2018-12-18 DIAGNOSIS — E440 Moderate protein-calorie malnutrition: Secondary | ICD-10-CM | POA: Diagnosis not present

## 2018-12-18 DIAGNOSIS — M2550 Pain in unspecified joint: Secondary | ICD-10-CM | POA: Diagnosis not present

## 2018-12-18 DIAGNOSIS — R262 Difficulty in walking, not elsewhere classified: Secondary | ICD-10-CM | POA: Diagnosis not present

## 2018-12-18 DIAGNOSIS — R918 Other nonspecific abnormal finding of lung field: Secondary | ICD-10-CM | POA: Diagnosis not present

## 2018-12-18 DIAGNOSIS — F331 Major depressive disorder, recurrent, moderate: Secondary | ICD-10-CM | POA: Diagnosis not present

## 2018-12-18 DIAGNOSIS — Z86711 Personal history of pulmonary embolism: Secondary | ICD-10-CM | POA: Diagnosis not present

## 2018-12-18 DIAGNOSIS — Z9181 History of falling: Secondary | ICD-10-CM | POA: Diagnosis not present

## 2018-12-18 DIAGNOSIS — S72142D Displaced intertrochanteric fracture of left femur, subsequent encounter for closed fracture with routine healing: Secondary | ICD-10-CM | POA: Diagnosis not present

## 2018-12-18 DIAGNOSIS — N183 Chronic kidney disease, stage 3 (moderate): Secondary | ICD-10-CM | POA: Diagnosis not present

## 2018-12-18 DIAGNOSIS — I69828 Other speech and language deficits following other cerebrovascular disease: Secondary | ICD-10-CM | POA: Diagnosis not present

## 2018-12-18 DIAGNOSIS — D62 Acute posthemorrhagic anemia: Secondary | ICD-10-CM | POA: Diagnosis not present

## 2018-12-18 DIAGNOSIS — E785 Hyperlipidemia, unspecified: Secondary | ICD-10-CM | POA: Diagnosis not present

## 2018-12-18 LAB — CBC WITH DIFFERENTIAL/PLATELET
Abs Immature Granulocytes: 0.19 10*3/uL — ABNORMAL HIGH (ref 0.00–0.07)
Basophils Absolute: 0 10*3/uL (ref 0.0–0.1)
Basophils Relative: 1 %
EOS ABS: 0.3 10*3/uL (ref 0.0–0.5)
Eosinophils Relative: 4 %
HCT: 31.4 % — ABNORMAL LOW (ref 36.0–46.0)
Hemoglobin: 9.5 g/dL — ABNORMAL LOW (ref 12.0–15.0)
Immature Granulocytes: 2 %
Lymphocytes Relative: 21 %
Lymphs Abs: 1.7 10*3/uL (ref 0.7–4.0)
MCH: 25.2 pg — AB (ref 26.0–34.0)
MCHC: 30.3 g/dL (ref 30.0–36.0)
MCV: 83.3 fL (ref 80.0–100.0)
Monocytes Absolute: 0.7 10*3/uL (ref 0.1–1.0)
Monocytes Relative: 8 %
Neutro Abs: 5.3 10*3/uL (ref 1.7–7.7)
Neutrophils Relative %: 64 %
Platelets: 257 10*3/uL (ref 150–400)
RBC: 3.77 MIL/uL — ABNORMAL LOW (ref 3.87–5.11)
RDW: 15.4 % (ref 11.5–15.5)
WBC: 8.2 10*3/uL (ref 4.0–10.5)
nRBC: 0 % (ref 0.0–0.2)

## 2018-12-18 LAB — TYPE AND SCREEN
ABO/RH(D): A NEG
Antibody Screen: POSITIVE
Unit division: 0

## 2018-12-18 LAB — PROTIME-INR
INR: 1.4
Prothrombin Time: 17 seconds — ABNORMAL HIGH (ref 11.4–15.2)

## 2018-12-18 LAB — BPAM RBC
Blood Product Expiration Date: 202003042359
ISSUE DATE / TIME: 202002151155
Unit Type and Rh: 600

## 2018-12-18 MED ORDER — TRAMADOL HCL 50 MG PO TABS: 50.0000 mg | ORAL_TABLET | Freq: Every day | ORAL | 1 refills | Status: DC

## 2018-12-18 MED ORDER — SENNOSIDES-DOCUSATE SODIUM 8.6-50 MG PO TABS
1.0000 | ORAL_TABLET | Freq: Two times a day (BID) | ORAL | Status: DC
  Administered 2018-12-18: 1 via ORAL
  Filled 2018-12-18 (×2): qty 1

## 2018-12-18 MED ORDER — NYSTATIN 100000 UNIT/GM EX POWD
Freq: Two times a day (BID) | CUTANEOUS | Status: DC
  Administered 2018-12-18: 11:00:00 via TOPICAL
  Filled 2018-12-18: qty 15

## 2018-12-18 MED ORDER — SENNOSIDES-DOCUSATE SODIUM 8.6-50 MG PO TABS: 1.0000 | ORAL_TABLET | Freq: Two times a day (BID) | ORAL | Status: DC

## 2018-12-18 MED ORDER — WARFARIN SODIUM 2 MG PO TABS: 3.0000 mg | ORAL_TABLET | Freq: Every day | ORAL | Status: DC

## 2018-12-18 MED ORDER — WARFARIN SODIUM 2 MG PO TABS
2.0000 mg | ORAL_TABLET | Freq: Once | ORAL | Status: AC
  Administered 2018-12-18: 2 mg via ORAL
  Filled 2018-12-18: qty 1

## 2018-12-18 MED ORDER — ALPRAZOLAM 0.5 MG PO TABS: 0.5000 mg | ORAL_TABLET | Freq: Every day | ORAL | 1 refills | Status: DC

## 2018-12-18 MED ORDER — NYSTATIN 100000 UNIT/GM EX POWD: Freq: Two times a day (BID) | CUTANEOUS | 0 refills | Status: DC

## 2018-12-18 NOTE — Plan of Care (Signed)
  Problem: Clinical Measurements: Goal: Ability to maintain clinical measurements within normal limits will improve Outcome: Progressing Goal: Diagnostic test results will improve Outcome: Progressing   Problem: Activity: Goal: Risk for activity intolerance will decrease Outcome: Progressing   

## 2018-12-18 NOTE — Progress Notes (Addendum)
Physical Therapy Treatment Patient Details Name: [name] [name] MRN: [id number] DOB: Jan 15, 1921 Today's Date: 12/18/2018    History of Present Illness Pt admitted from SNF s/p fall with L femur fx and with IM nail repair.  Pt with hx of CAD and dementia    PT Comments    POD # 5 am session Pt alert and aware she fell and broke her hip.  Assisted OOB to recliner required + 2 assist.  Pt unable to take any functional steps.  Pt will need ST Rehab to regain prior level of mobility.   Follow Up Recommendations  SNF     Equipment Recommendations  None recommended by PT    Recommendations for Other Services       Precautions / Restrictions Precautions Precautions: Fall Restrictions Weight Bearing Restrictions: No LLE Weight Bearing: Weight bearing as tolerated    Mobility  Bed Mobility Overal bed mobility: Needs Assistance Bed Mobility: Supine to Sit     Supine to sit: Max assist;+2 for physical assistance     General bed mobility comments: Pt assisting slightly to shift in bed and bring trunk to upright EOB tolerated static sitting x 4 min at Supervision level  Transfers Overall transfer level: Needs assistance Equipment used: None Transfers: Stand Pivot Transfers           General transfer comment: attempted sit to stand + 2 side by side assist however pt unable to clear hups from bed so performed "Bear Hug" stand pivot 1/4 turn from elevated bed to recliner.  Tolerated transfer well with Mod c/o hip pain.  Ambulation/Gait             General Gait Details: unable at this time due to pain   Stairs             Wheelchair Mobility    Modified Rankin (Stroke Patients Only)       Balance                                            Cognition Arousal/Alertness: Awake/alert Behavior During Therapy: WFL for tasks assessed/performed Overall Cognitive Status: Within Functional Limits for tasks assessed                                  General Comments: AxO x 3 following all commands and aware she fell and broke her hip.  Pleasant.      Exercises      General Comments        Pertinent Vitals/Pain Pain Assessment: Faces Faces Pain Scale: Hurts little more Pain Location: L LE with movement Pain Descriptors / Indicators: Moaning;Grimacing;Guarding Pain Intervention(s): Monitored during session;Repositioned;Ice applied    Home Living                      Prior Function            PT Goals (current goals can now be found in the care plan section) Progress towards PT goals: Progressing toward goals    Frequency    Min 2X/week      PT Plan Current plan remains appropriate    Co-evaluation              AM-PAC PT "6 Clicks" Mobility   Outcome Measure  Help needed turning from  your back to your side while in a flat bed without using bedrails?: Total Help needed moving from lying on your back to sitting on the side of a flat bed without using bedrails?: Total Help needed moving to and from a bed to a chair (including a wheelchair)?: Total Help needed standing up from a chair using your arms (e.g., wheelchair or bedside chair)?: Total Help needed to walk in hospital room?: Total Help needed climbing 3-5 steps with a railing? : Total 6 Click Score: 6    End of Session Equipment Utilized During Treatment: Gait belt Activity Tolerance: Patient limited by fatigue;Patient limited by pain Patient left: in chair;with call bell/phone within reach Nurse Communication: Need for lift equipment PT Visit Diagnosis: Muscle weakness (generalized) (M62.81);History of falling (Z91.81);Difficulty in walking, not elsewhere classified (R26.2);Pain Pain - Right/Left: Left     Time: 2800-3491 PT Time Calculation (min) (ACUTE ONLY): 25 min  Charges:  $Therapeutic Activity: 23-37 mins                     Rica Koyanagi  PTA Acute  Rehabilitation Services Pager       [phone] Office      [phone]

## 2018-12-18 NOTE — Care Management Important Message (Signed)
Important Message  Patient Details  Name: [name] [name] MRN: [id number] Date of Birth: [birth year]   Medicare Important Message Given:  Yes    Kerin Salen 12/18/2018, 3:05 [place] Message  Patient Details  Name: [name] [name] MRN: [id number] Date of Birth: Jun 22, 1921   Medicare Important Message Given:  Yes    Kerin Salen 12/18/2018, 3:05 PM

## 2018-12-18 NOTE — Progress Notes (Addendum)
[employer] Medicare is requesting additional therapy notes that indicate patient level of functioning beyond post op day 1 and patient cognitive ability to follow commands. CSW notified the physical therapist.   Updated clinicals faxed.   Kathrin Greathouse, Marlinda Mike, MSW Clinical Social Worker  [phone] 12/18/2018  9:26 AM

## 2018-12-18 NOTE — Progress Notes (Signed)
ANTICOAGULATION CONSULT NOTE - [place] for warfarin Indication: pulmonary embolus and VTE prophylaxis  Allergies  Allergen Reactions  . Ambien [Zolpidem Tartrate] Nausea And Vomiting  . Codeine Phosphate Nausea And Vomiting  . Darvocet [Propoxyphene N-Acetaminophen] Nausea And Vomiting  . Fish-Derived Products   . Promethazine Hcl Nausea And Vomiting  . Sulfamethoxazole Other (See Comments)    Pt doesn't remember reaction  . Vicodin [Hydrocodone-Acetaminophen] Other (See Comments)    Unknown reaction  . Amoxicillin Nausea And Vomiting and Rash  . Penicillins Nausea And Vomiting and Rash    Patient Measurements: Height: 5\' 2"  (157.5 cm) Weight: 126 lb (57.2 kg) IBW/kg (Calculated) : 50.1  Vital Signs: Temp: 98.5 F (36.9 C) (02/17 0854) Temp Source: Oral (02/17 0854) BP: 156/59 (02/17 0854) Pulse Rate: 70 (02/17 0854)  Labs: Recent Labs    12/16/18 0611 12/17/18 0417 12/18/18 0408  HGB 7.3* 9.4* 9.5*  HCT 24.5* 31.3* 31.4*  PLT 282 265 257  LABPROT 58.9* 24.3* 17.0*  INR 6.96* 2.22 1.40  CREATININE 0.86 0.99  --     Estimated Creatinine Clearance: 26.9 mL/min (by C-G formula based on SCr of 0.99 mg/dL).   Medical History: Past Medical History:  Diagnosis Date  . Anemia 03/02/2012  . Angina pectoris, unstable ([place]) 03/02/2012  . Anxiety   . Axillary abscess   . Bradycardia 06/13/2012  . CAD (coronary artery disease)    S/p PTCA / stenting (last cath 2004, multiple LAD stents, 2 stents in the right coronary artery all patent)  . Cancer ([place])   . Complication of anesthesia   . Compression fracture of L2 ([place]) 03/12/2017  . Dementia ([place])   . Dysphagia 10/06/2012  . Dyspnea 02/03/2012  . GERD (gastroesophageal reflux disease)   . History of pulmonary embolus (PE) 03/19/2017  . HTN (hypertension)   . PMR (polymyalgia rheumatica) (HCC)   . PONV (postoperative nausea and vomiting)   . Restless leg syndrome   . VTE (venous  thromboembolism) 10/2010   DVT and PE. Started coumadin  . Weakness of both legs 06/09/2012    Medications:  Scheduled:  . amLODipine  10 mg Oral q morning - 10a  . atorvastatin  10 mg Oral QPM  . donepezil  10 mg Oral QPM  . doxepin  6 mg Oral QHS  . mirtazapine  7.5 mg Oral QHS  . nystatin   Topical BID  . oxybutynin  5 mg Oral BID  . predniSONE  1 mg Oral Q breakfast  . rOPINIRole  1 mg Oral QHS  . senna-docusate  1 tablet Oral QHS  . traMADol  50 mg Oral Q6H  . warfarin  2 mg Oral ONCE-1800  . Warfarin - Pharmacist Dosing Inpatient   Does not apply q1800   Infusions:  . sodium chloride Stopped (12/15/18 4696)    Assessment: 85 yo female POD4 ORIF left hip fracture. Warfarin was resumed post-op. Pt was on warfarin PTA for hx PE.   INR (2.8) was therapeutic on admission.  PTA dose: warfarin 3 mg PO daily (confirmed on facility [facility]).  Recent INR check on 12/07/18 = 2.1 per [facility]  Significant Events:  2/11: Vitamin K 10 mg PO given 2/12: Surgery - intramedullary IM nail left femoral 2/12: Warfarin resumed  2/15: INR 7, vitamin K 2.5 mg PO given. Transfused 1 unit PRBC.  Today, 12/18/18   INR 1.40 is therapeutic.   Decreased s/p vitamin K on 2/15  Warfarin doses held on 2/14,  2/15, 2/16  Hgb 9.5 improved  Plt (257) - stable, WNL  No signs of bleeding per discussion with RN  Per discussion with MD, MD ordered 2 mg PO x1. Messaged MD, confirmed that this is the dose to be ordered today and then pt to be reevaluated in AM.   Goal of Therapy:  INR 2-3   Plan:   Warfarin 2 mg PO x1 as ordered by MD   Monitor closely for signs/symptoms of bleeding or thrombosis  Continue with daily INR and CBC checks  Royetta Asal, PharmD, BCPS Pager [phone] 12/18/2018 10:28 AM

## 2018-12-18 NOTE — Discharge Summary (Signed)
Physician Discharge Summary  [name] [name] RJJ:[id number] DOB: [birth year] DOA: 12/11/2018  PCP: Hennie Duos, MD  Admit date: 12/11/2018 Discharge date: 12/18/2018 Updated on 2/17   Time spent: 39 minutes  Recommendations for Outpatient Follow-up:  1. Recommend CBC, INR, Chem-12 in 1 week 2. Resume coumadin at low dose 2 mg and reassess with inr in 3 days 3. Up with therapy and follow-up with Dr. Alvan Dame as an outpatient regarding postop management 2 weeks from time of discharge 4. Careful for sedation with regards to multiple meds with that potential 5. Consider vitamin D levels as an outpatient  Discharge Diagnoses:  Principal Problem:   Closed left hip fracture, initial encounter ([place]) Active Problems:   CAD (coronary artery disease)   HTN (hypertension)   PMR (polymyalgia rheumatica) (HCC)   History of pulmonary embolus (PE)   CKD (chronic kidney disease), stage III (HCC)   Depression with anxiety   Pressure injury of skin   Discharge Condition: Improved  Diet recommendation: Heart healthy low-salt  Filed Weights   12/11/18 2242  Weight: 57.2 kg    History of present illness:  85 year old  anxiety/depression,  coronary artery disease,  polymyalgia rheumatica,  DVT on Coumadin, dementia and essential hypertension   came to the hospital from skilled nursing facility with complaints of left severe hip pain on 2/10  She was found to have left intertrochanteric femoral neck fracture.  Orthopedic was consulted.  She was given vitamin K due to elevated INR.  Hospital Course:   Left intertrochanteric hip fracture -Orthopedic following and performed surgery 2/12 -Pain control, PT/OT thereafter. -cleared by orthopedic, Coumadin resumed and will need recheck in 4 to 7 days Stable for skilled facility discharge  History of DVT/pulmonary embolism -No current evidence of this at this time -INR subtherapeutic prior to discharge needs recheck in 4 to 7 days -INR  level 6-->Vit K 2.5-->2.22-->1.4 range  -starting on coumadin slolwy again 2 mg, recheck in 3-4 days  Anemia of expected blood loss - transfused 1 U PRBC 2/15 -She has not had a stool and I doubt that she has lower GI bleed rather she may have some component of dilution Labs stabilized hemoglobin 7.3-->9.4  History of polymyalgia rheumatica -Currently stable on low-dose prednisone.  We will continue this for now.   Coronary artery disease -Currently home oral medications on hold.  Holding antihypertensives for now until the surgery.    Resumed losartan on discharge  Essential hypertension -Continue Norvasc and hydralazine.    History of depression, anxiety and dementia -some risk becoming delirious in the hospital.  She is aware of this.  Will use basic delirium precaution if necessary.  In the meantime continue her home medications including Aricept, doxepin, Remeron and Xanax. -She was clear mentally on time of discharge  Chronic kidney disease stage III -Renal function appears to be stable. -rpt Chem-12 as an outpatient  Consultants:   Orthopedic  Procedures:   IM nail placement 12/13/2018  Discharge Exam: Vitals:   12/18/18 0854 12/18/18 1359  BP: (!) 156/59 (!) 149/56  Pulse: 70 62  Resp: 18 16  Temp: 98.5 F (36.9 C) 98.3 F (36.8 C)  SpO2: 96% 98%    General: Awake alert coherent Cardiovascular: S1-S2 no murmur rub or gallop Respiratory: Clinically clear no added sound no rales no rhonchi Abdomen soft nontender no rebound no guarding  Discharge Instructions   Discharge Instructions    Diet - low sodium heart healthy   Complete by:  As  directed    Increase activity slowly   Complete by:  As directed      Allergies as of 12/18/2018      Reactions   Ambien [zolpidem Tartrate] Nausea And Vomiting   Codeine Phosphate Nausea And Vomiting   Darvocet [propoxyphene N-acetaminophen] Nausea And Vomiting   Fish-derived Products    Promethazine Hcl  Nausea And Vomiting   Sulfamethoxazole Other (See Comments)   Pt doesn't remember reaction   Vicodin [hydrocodone-acetaminophen] Other (See Comments)   Unknown reaction   Amoxicillin Nausea And Vomiting, Rash   Penicillins Nausea And Vomiting, Rash      Medication List    STOP taking these medications   bisacodyl 10 MG suppository Commonly known as:  DULCOLAX   hydrALAZINE 25 MG tablet Commonly known as:  APRESOLINE   potassium chloride SA 20 MEQ tablet Commonly known as:  K-DUR,KLOR-CON     TAKE these medications   acetaminophen 325 MG tablet Commonly known as:  TYLENOL Take 1-2 tablets (325-650 mg total) by mouth every 6 (six) hours as needed for mild pain (pain score 1-3 or temp > 100.5).   ALPRAZolam 0.5 MG tablet Commonly known as:  XANAX Take 1 tablet (0.5 mg total) by mouth daily.   amLODipine 10 MG tablet Commonly known as:  NORVASC Take 10 mg by mouth every morning.   aspirin EC 81 MG tablet Take 1 tablet (81 mg total) by mouth daily.   atorvastatin 10 MG tablet Commonly known as:  LIPITOR Take 10 mg by mouth every evening.   cholecalciferol 1000 units tablet Commonly known as:  VITAMIN D Take 2,000 Units by mouth daily with lunch. 2 tabs   donepezil 10 MG tablet Commonly known as:  ARICEPT Take 10 mg by mouth every evening.   ENEMA RE Place 1 enema rectally daily as needed (constipation).   losartan 50 MG tablet Commonly known as:  COZAAR Take 50 mg by mouth at bedtime.   magnesium hydroxide 400 MG/5ML suspension Commonly known as:  MILK OF MAGNESIA Take 30 mLs by mouth daily as needed for mild constipation.   mirtazapine 15 MG tablet Commonly known as:  REMERON Take 0.5 tablets (7.5 mg total) by mouth at bedtime.   nitroGLYCERIN 0.4 MG SL tablet Commonly known as:  NITROSTAT Place 0.4 mg under the tongue every 5 (five) minutes as needed for chest pain.   nystatin powder Commonly known as:  MYCOSTATIN/NYSTOP Apply topically 2 (two)  times daily.   ondansetron 4 MG tablet Commonly known as:  ZOFRAN Take 4 mg by mouth every 4 (four) hours as needed for nausea.   oxybutynin 5 MG tablet Commonly known as:  DITROPAN Take 5 mg by mouth 2 (two) times daily.   polyethylene glycol packet Commonly known as:  MIRALAX / GLYCOLAX Take 17 g by mouth daily as needed for moderate constipation.   predniSONE 1 MG tablet Commonly known as:  DELTASONE Take 1 mg by mouth daily with breakfast.   PROAIR HFA 108 (90 Base) MCG/ACT inhaler Generic drug:  albuterol Inhale 2 puffs into the lungs every 6 (six) hours as needed for wheezing or shortness of breath.   rOPINIRole 1 MG tablet Commonly known as:  REQUIP Take 1 mg by mouth at bedtime. 1mg  at noon, 1mg  2 hours before bed, then 1mg  at bedtime   senna-docusate 8.6-50 MG tablet Commonly known as:  Senokot-S Take 1 tablet by mouth 2 (two) times daily.   SILENOR 6 MG Tabs Generic drug:  Doxepin HCl Take 0.5 tablets (3 mg total) by mouth at bedtime. What changed:  how much to take   traMADol 50 MG tablet Commonly known as:  ULTRAM Take 50 mg by mouth at bedtime.   TUMS 500 MG chewable tablet Generic drug:  calcium carbonate Chew 1 tablet by mouth 3 (three) times daily as needed for indigestion or heartburn.   warfarin 3 MG tablet Commonly known as:  COUMADIN Take 3 mg by mouth daily.      Allergies  Allergen Reactions  . Ambien [Zolpidem Tartrate] Nausea And Vomiting  . Codeine Phosphate Nausea And Vomiting  . Darvocet [Propoxyphene N-Acetaminophen] Nausea And Vomiting  . Fish-Derived Products   . Promethazine Hcl Nausea And Vomiting  . Sulfamethoxazole Other (See Comments)    Pt doesn't remember reaction  . Vicodin [Hydrocodone-Acetaminophen] Other (See Comments)    Unknown reaction  . Amoxicillin Nausea And Vomiting and Rash  . Penicillins Nausea And Vomiting and Rash   Follow-up Information    Paralee Cancel, MD Follow up in 2 week(s).   Specialty:   Orthopedic Surgery Why:  for wound check and X-rays Contact information: [address] [place] [address] [phone]            The results of significant diagnostics from this hospitalization (including imaging, microbiology, ancillary and laboratory) are listed below for reference.    Significant Diagnostic Studies: Dg Lumbar Spine Complete  Result Date: 12/11/2018 CLINICAL DATA:  Recent fall with low back pain EXAM: LUMBAR SPINE - COMPLETE 4+ VIEW COMPARISON:  03/08/2017 FINDINGS: Stable compression deformities of L2 and L3 are noted. Degenerative changes are noted worst at L4-5 facet hypertrophic changes are noted. No acute bony abnormality is seen. IMPRESSION: Chronic degenerative changes and compression fractures without acute abnormality. Electronically Signed   By: Inez Catalina M.D.   On: 12/11/2018 22:28   Dg Pelvis 1-2 Views  Result Date: 12/11/2018 CLINICAL DATA:  Recent fall with left leg pain, initial encounter EXAM: PELVIS - 1-2 VIEW COMPARISON:  None. FINDINGS: Pelvic ring is intact. Comminuted intratrochanteric left femoral fracture is noted with impaction and mild angulation at the fracture site. Femoral head is well seated. IMPRESSION: Comminuted intratrochanteric left femoral fracture. Electronically Signed   By: Inez Catalina M.D.   On: 12/11/2018 22:27   Ct Head Wo Contrast  Result Date: 12/11/2018 CLINICAL DATA:  Recent fall EXAM: CT HEAD WITHOUT CONTRAST CT CERVICAL SPINE WITHOUT CONTRAST TECHNIQUE: Multidetector CT imaging of the head and cervical spine was performed following the standard protocol without intravenous contrast. Multiplanar CT image reconstructions of the cervical spine were also generated. COMPARISON:  11/14/15 FINDINGS: CT HEAD FINDINGS Brain: Chronic white matter ischemic changes are noted. No findings to suggest acute hemorrhage, acute infarction or space-occupying mass lesion are noted. Dense lesion is again identified within  the sella stable from the prior exam. Vascular: No hyperdense vessel or unexpected calcification. Skull: Normal. Negative for fracture or focal lesion. Sinuses/Orbits: No acute finding. Other: None CT CERVICAL SPINE FINDINGS Alignment: Within normal limits. Skull base and vertebrae: 7 cervical segments are well visualized. Vertebral body height is well maintained. No acute fracture or acute facet abnormality is noted. Facet hypertrophic changes are seen. No significant neural foraminal narrowing is noted. Soft tissues and spinal canal: Surrounding soft tissues are unremarkable. Upper chest: Visualized upper lung fields are within normal limits. Other: None IMPRESSION: CT of the head: No change from the prior exam. No acute abnormality noted. CT of the  cervical spine: Multilevel degenerative change without acute abnormality. Electronically Signed   By: Inez Catalina M.D.   On: 12/11/2018 22:16   Ct Cervical Spine Wo Contrast  Result Date: 12/11/2018 CLINICAL DATA:  Recent fall EXAM: CT HEAD WITHOUT CONTRAST CT CERVICAL SPINE WITHOUT CONTRAST TECHNIQUE: Multidetector CT imaging of the head and cervical spine was performed following the standard protocol without intravenous contrast. Multiplanar CT image reconstructions of the cervical spine were also generated. COMPARISON:  11/14/15 FINDINGS: CT HEAD FINDINGS Brain: Chronic white matter ischemic changes are noted. No findings to suggest acute hemorrhage, acute infarction or space-occupying mass lesion are noted. Dense lesion is again identified within the sella stable from the prior exam. Vascular: No hyperdense vessel or unexpected calcification. Skull: Normal. Negative for fracture or focal lesion. Sinuses/Orbits: No acute finding. Other: None CT CERVICAL SPINE FINDINGS Alignment: Within normal limits. Skull base and vertebrae: 7 cervical segments are well visualized. Vertebral body height is well maintained. No acute fracture or acute facet abnormality is noted.  Facet hypertrophic changes are seen. No significant neural foraminal narrowing is noted. Soft tissues and spinal canal: Surrounding soft tissues are unremarkable. Upper chest: Visualized upper lung fields are within normal limits. Other: None IMPRESSION: CT of the head: No change from the prior exam. No acute abnormality noted. CT of the cervical spine: Multilevel degenerative change without acute abnormality. Electronically Signed   By: Inez Catalina M.D.   On: 12/11/2018 22:16   Chest Portable 1 View  Result Date: 12/12/2018 CLINICAL DATA:  Preop fractured hip EXAM: PORTABLE CHEST 1 VIEW COMPARISON:  03/11/2017 FINDINGS: Elevation of the right hemidiaphragm. No confluent airspace opacities or effusions. Heart is normal size. No acute bony abnormality. IMPRESSION: Stable chronic elevation of the right hemidiaphragm. No active disease. Electronically Signed   By: Rolm Baptise M.D.   On: 12/12/2018 00:30   Dg C-arm 1-60 Min-no Report  Result Date: 12/13/2018 Fluoroscopy was utilized by the requesting physician.  No radiographic interpretation.   Dg Femur Min 2 Views Left  Result Date: 12/11/2018 CLINICAL DATA:  Recent fall with left leg pain, initial encounter EXAM: LEFT FEMUR 2 VIEWS COMPARISON:  None. FINDINGS: Comminuted left intratrochanteric femoral fracture is noted. Some impaction and angulation at the fracture site is noted. No other fracture is seen. IMPRESSION: Left intratrochanteric femoral fracture. Electronically Signed   By: Inez Catalina M.D.   On: 12/11/2018 22:29   Dg Femur, Min 2 Views Right  Result Date: 12/13/2018 CLINICAL DATA:  Status post surgical fixation of proximal left femoral fracture. EXAM: RIGHT FEMUR 2 VIEWS Radiation exposure index: 6.5328 mGy. COMPARISON:  Radiograph of December 11, 2018. FINDINGS: Four intraoperative fluoroscopic images demonstrate surgical internal fixation of proximal left femoral inter trochanteric fracture. Good alignment of fracture components is  noted. IMPRESSION: Status post surgical internal fixation of proximal left femoral intertrochanteric fracture. Electronically Signed   By: Marijo Conception, M.D.   On: 12/13/2018 14:59   [place] Ekg Site Rite  Result Date: 12/12/2018 If Site Rite image not attached, placement could not be confirmed due to current cardiac rhythm.   Microbiology: Recent Results (from the past 240 hour(s))  MRSA PCR Screening     Status: None   Collection Time: 12/12/18 11:14 AM  Result Value Ref Range Status   MRSA by PCR NEGATIVE NEGATIVE Final    Comment:        The GeneXpert MRSA Assay (FDA approved for NASAL specimens only), is one component of a comprehensive MRSA  colonization surveillance program. It is not intended to diagnose MRSA infection nor to guide or monitor treatment for MRSA infections. Performed at [facility], [place] [address] [place], [place] [address]      Labs: Basic Metabolic Panel: Recent Labs  Lab 12/12/18 [phone] 12/13/18 0316 12/14/18 0536 12/15/18 0911 12/16/18 0611 12/17/18 0417  NA 135 139 138 138 136 135  K 3.9 3.8 3.9 3.4* 4.0 4.1  CL 104 108 110 108 108 105  CO2 22 22 22 23 22 22   GLUCOSE 132* 118* 144* 85 96 104*  BUN 18 20 25* 25* 21 20  CREATININE 0.89 0.90 1.13* 0.81 0.86 0.99  CALCIUM 8.7* 8.5* 8.3* 7.8* 7.8* 7.7*  MG 2.2 2.1 2.1 2.3 2.2  --    Liver Function Tests: No results for input(s): AST, ALT, ALKPHOS, BILITOT, PROT, ALBUMIN in the last 168 hours. No results for input(s): LIPASE, AMYLASE in the last 168 hours. No results for input(s): AMMONIA in the last 168 hours. CBC: Recent Labs  Lab 12/11/18 2308  12/14/18 0536 12/15/18 0911 12/16/18 0611 12/17/18 0417 12/18/18 0408  WBC 14.6*   < > 11.5* 12.0* 7.4 7.3 8.2  NEUTROABS 12.6*  --   --   --   --  4.7 5.3  HGB 11.0*   < > 9.1* 7.5* 7.3* 9.4* 9.5*  HCT 36.4   < > 29.5* 26.1* 24.5* 31.3* 31.4*  MCV 83.7   < > 80.2 87.0 83.3 83.2 83.3  PLT 311   < > 220 257 282 265 257    < > = values in this interval not displayed.   Cardiac Enzymes: No results for input(s): CKTOTAL, CKMB, CKMBINDEX, TROPONINI in the last 168 hours. BNP: BNP (last 3 results) No results for input(s): BNP in the last 8760 hours.  ProBNP (last 3 results) No results for input(s): PROBNP in the last 8760 hours.  CBG: No results for input(s): GLUCAP in the last 168 hours.     Signed:  Nita Sells MD   [facility] 12/18/2018, 3:30 PM

## 2018-12-18 NOTE — Care Management Note (Signed)
Case Management Note  Patient Details  Name: [name] [name] MRN: [id number] Date of Birth: Nov 03, 1920  Subjective/Objective:                  Discharge Readiness Return to top of Hip Fracture, Open Repair [facility] - [place]  Discharge readiness is indicated by patient meeting Recovery Milestones, including ALL of the following: ? Hemodynamic stability yes ? No evidence of new neurovascular compromise of lower extremity none ? Pain absent or managed MANAGED ? Mental status at baseline YES ? No evidence of postoperative or surgical site infection NOT AT THIS TIME ? Ambulatory with crutches or walker[L] NO PLAN IS FOR PT TO SEE TODAY [id number] ? Oral hydration, medications, and diet REGULAR  ? DischaGE PLANS-SNF FOR REHAB   Action/Plan: CLINICAL CSW FOLLOWING FOR SNF NEEDS  Expected Discharge Date:  12/15/18               Expected Discharge Plan:  Skilled Nursing Facility  In-House Referral:  Clinical Social Work  Discharge planning Services  CM Consult  Post Acute Care Choice:  NA Choice offered to:  Patient  DME Arranged:  N/A DME Agency:  NA  HH Arranged:  NA HH Agency:  NA  Status of Service:  In process, will continue to follow  If discussed at Long Length of Stay Meetings, dates discussed:    Additional Comments:  Leeroy Cha, RN 12/18/2018, 10:15 AM

## 2018-12-18 NOTE — Progress Notes (Signed)
Patient is a LTC resident at [facility]. Patient is returning under her BCBS-Medicare rehab benefits.  Authorization: [id number] PT/OT-TE Speech-SD Nursing-PDE1 NGA-NF Approved 3 days starting 2/19, next review day 2/19  CSW notified the patient son [name] patient is returning to SNF.   Kathrin Greathouse, Marlinda Mike, MSW Clinical Social Worker  [phone] 12/18/2018  4:01 PM

## 2018-12-18 NOTE — Progress Notes (Signed)
Report called to [facility].  Iv removed, AVS placed in packet.  Pt ready for d/c.

## 2018-12-19 ENCOUNTER — Encounter: Payer: Self-pay | Admitting: Internal Medicine

## 2018-12-19 ENCOUNTER — Non-Acute Institutional Stay (SKILLED_NURSING_FACILITY): Payer: Medicare Other | Admitting: Internal Medicine

## 2018-12-19 DIAGNOSIS — I8290 Acute embolism and thrombosis of unspecified vein: Secondary | ICD-10-CM

## 2018-12-19 DIAGNOSIS — I10 Essential (primary) hypertension: Secondary | ICD-10-CM

## 2018-12-19 DIAGNOSIS — S72145D Nondisplaced intertrochanteric fracture of left femur, subsequent encounter for closed fracture with routine healing: Secondary | ICD-10-CM

## 2018-12-19 DIAGNOSIS — Z86711 Personal history of pulmonary embolism: Secondary | ICD-10-CM

## 2018-12-19 DIAGNOSIS — D62 Acute posthemorrhagic anemia: Secondary | ICD-10-CM | POA: Diagnosis not present

## 2018-12-19 DIAGNOSIS — M353 Polymyalgia rheumatica: Secondary | ICD-10-CM

## 2018-12-19 DIAGNOSIS — S32020D Wedge compression fracture of second lumbar vertebra, subsequent encounter for fracture with routine healing: Secondary | ICD-10-CM

## 2018-12-19 NOTE — Progress Notes (Signed)
:  Location:  (P) [place] of Service:  (P) SNF (31)  Marlyn Tondreau D. [name], MD  Patient Care Team: Hennie Duos, MD as PCP - General (Internal Medicine)  Extended Emergency Contact Information Primary Emergency Contact: Pina,[name] Address: [address]          [place],  [address] [name] of [place] Phone: [phone] Mobile Phone: [phone] Relation: Son Secondary Emergency Contact: [place] of [place] Phone: [phone] Relation: Daughter     Allergies: Ambien [zolpidem tartrate]; Codeine phosphate; Darvocet [propoxyphene n-acetaminophen]; Fish-derived products; Promethazine hcl; Sulfamethoxazole; Vicodin [hydrocodone-acetaminophen]; Amoxicillin; and Penicillins  Chief Complaint  Patient presents with  . New Admit To SNF    Admit to [facility]    HPI: Patient is 85 y.o. female with depression, anxiety, coronary artery disease, polymyalgia rheumatica, dementia, and hypertension who presented to [facility] ED for evaluation of a fall.  Patient reports that she was in her usual state of health when she fell onto her left side the evening of admission and experienced immediate and severe pain in the left hip.  She denied any recent chest pain cough shortness of breath fever chills.  Patient does not member exactly why she fell but denies hitting her head or losing consciousness she denies any melena or hematochezia.  In the ED all vital signs were stable EKG was normal sinus rhythm, CT head and neck were without fracture.  Plain radiograph revealed a left intertrochanteric femoral fracture and radiographs of the lumbar spine were negative for acute process.  WBC was 14.6 and INR 2.78.  Patient was admitted to [facility] from 2/10-17 where she went to surgery on 2/12 12 patient was transfused 1 unit PRBC on 2/15 for hemoglobin of 7.3.  Otherwise hospitalization was unremarkable and patient is  admitted back to skilled nursing facility for OT/PT and residential care.  While at skilled nursing facility patient will be followed for history of DVT/PE treated with Coumadin which became supratherapeutic while in the hospital, polymyalgia rheumatica treated with low-dose prednisone and hypertension treated with losartan Norvasc and hydralazine.  Past Medical History:  Diagnosis Date  . Anemia 03/02/2012  . Angina pectoris, unstable ([place]) 03/02/2012  . Anxiety   . Axillary abscess   . Bradycardia 06/13/2012  . CAD (coronary artery disease)    S/p PTCA / stenting (last cath 2004, multiple LAD stents, 2 stents in the right coronary artery all patent)  . Cancer ([facility])   . Complication of anesthesia   . Compression fracture of L2 ([place]) 03/12/2017  . Dementia ([place])   . Dysphagia 10/06/2012  . Dyspnea 02/03/2012  . GERD (gastroesophageal reflux disease)   . History of pulmonary embolus (PE) 03/19/2017  . HTN (hypertension)   . PMR (polymyalgia rheumatica) (HCC)   . PONV (postoperative nausea and vomiting)   . Restless leg syndrome   . 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 Started coumadin  . Weakness of both legs 06/09/2012    Past Surgical History:  Procedure Laterality Date  . BREAST LUMPECTOMY    . COLECTOMY    . ESOPHAGOGASTRODUODENOSCOPY (EGD) WITH ESOPHAGEAL DILATION  10/10/2012   Procedure: ESOPHAGOGASTRODUODENOSCOPY (EGD) WITH ESOPHAGEAL DILATION;  Surgeon: Inda Castle, MD;  Location: [facility];  Service: Endoscopy;  Laterality: N/A;  . FEMUR IM NAIL Left 12/13/2018   Procedure: INTRAMEDULLARY (IM) NAIL FEMORAL LEFT;  Surgeon: Paralee Cancel, MD;  Location: [facility];  Service: Orthopedics;  Laterality: Left;  . heart stents  x 8  . INCISION AND DRAINAGE     bilateral axillary, non specific staff  . INCISION AND DRAINAGE ABSCESS  09/28/2012   Procedure: INCISION AND DRAINAGE ABSCESS;  Surgeon: Zenovia Jarred, MD;  Location: [facility];  Service: General;  Laterality:  Bilateral;  . stent     cardiac x 8 stents.    Allergies as of 12/19/2018      Reactions   Ambien [zolpidem Tartrate] Nausea And Vomiting   Codeine Phosphate Nausea And Vomiting   Darvocet [propoxyphene N-acetaminophen] Nausea And Vomiting   Fish-derived Products    Promethazine Hcl Nausea And Vomiting   Sulfamethoxazole Other (See Comments)   Pt doesn't remember reaction   Vicodin [hydrocodone-acetaminophen] Other (See Comments)   Unknown reaction   Amoxicillin Nausea And Vomiting, Rash   Penicillins Nausea And Vomiting, Rash      Medication List       Accurate as of December 19, 2018  2:10 PM. Always use your most recent med list.        acetaminophen 325 MG tablet Commonly known as:  TYLENOL Take 1-2 tablets (325-650 mg total) by mouth every 6 (six) hours as needed for mild pain (pain score 1-3 or temp > 100.5).   ALPRAZolam 0.5 MG tablet Commonly known as:  XANAX Take 1 tablet (0.5 mg total) by mouth daily.   amLODipine 10 MG tablet Commonly known as:  NORVASC Take 10 mg by mouth every morning.   aspirin EC 81 MG tablet Take 1 tablet (81 mg total) by mouth daily.   atorvastatin 10 MG tablet Commonly known as:  LIPITOR Take 10 mg by mouth every evening.   cholecalciferol 1000 units tablet Commonly known as:  VITAMIN D Take 2,000 Units by mouth daily with lunch. 2 tabs   donepezil 10 MG tablet Commonly known as:  ARICEPT Take 10 mg by mouth every evening.   ENEMA RE Place 1 enema rectally daily as needed (constipation).   losartan 50 MG tablet Commonly known as:  COZAAR Take 50 mg by mouth at bedtime.   magnesium hydroxide 400 MG/5ML suspension Commonly known as:  MILK OF MAGNESIA Take 30 mLs by mouth daily as needed for mild constipation.   mirtazapine 15 MG tablet Commonly known as:  REMERON Take 0.5 tablets (7.5 mg total) by mouth at bedtime.   nitroGLYCERIN 0.4 MG SL tablet Commonly known as:  NITROSTAT Place 0.4 mg under the tongue every 5  (five) minutes as needed for chest pain.   nystatin powder Commonly known as:  MYCOSTATIN/NYSTOP Apply topically 2 (two) times daily.   ondansetron 4 MG tablet Commonly known as:  ZOFRAN Take 4 mg by mouth every 4 (four) hours as needed for nausea.   oxybutynin 5 MG tablet Commonly known as:  DITROPAN Take 5 mg by mouth 2 (two) times daily.   polyethylene glycol packet Commonly known as:  MIRALAX / GLYCOLAX Take 17 g by mouth daily as needed for moderate constipation.   predniSONE 1 MG tablet Commonly known as:  DELTASONE Take 1 mg by mouth daily with breakfast.   PROAIR HFA 108 (90 Base) MCG/ACT inhaler Generic drug:  albuterol Inhale 2 puffs into the lungs every 6 (six) hours as needed for wheezing or shortness of breath.   rOPINIRole 1 MG tablet Commonly known as:  REQUIP Take 1 mg by mouth at bedtime. 1mg  at noon, 1mg  2 hours before bed, then 1mg  at  bedtime   senna-docusate 8.6-50 MG tablet Commonly known as:  Senokot-S Take 1 tablet by mouth 2 (two) times daily.   SILENOR 6 MG Tabs Generic drug:  Doxepin HCl Take 0.5 tablets (3 mg total) by mouth at bedtime.   traMADol 50 MG tablet Commonly known as:  ULTRAM Take 1 tablet (50 mg total) by mouth at bedtime.   TUMS 500 MG chewable tablet Generic drug:  calcium carbonate Chew 1 tablet by mouth 3 (three) times daily as needed for indigestion or heartburn.   warfarin 2 MG tablet Commonly known as:  COUMADIN Take 1.5 tablets (3 mg total) by mouth daily.       No orders of the defined types were placed in this encounter.   Immunization History  Administered Date(s) Administered  . Influenza-Unspecified 08/01/2016, 08/11/2017  . PPD Test 03/18/2017  . Pneumococcal Conjugate-13 09/17/2017    Social History   Tobacco Use  . Smoking status: Never Smoker  . Smokeless tobacco: Never Used  Substance Use Topics  . Alcohol use: No    Family history is   Family History  Problem Relation Age of Onset  .  Heart failure Mother   . Stroke Father   . Breast cancer Sister   . Heart disease Sister   . Heart disease Brother       Review of Systems  DATA OBTAINED: from patient limited; nursing-no acute concerns GENERAL:  no fevers, fatigue, appetite changes SKIN: No itching, or rash EYES: No eye pain, redness, discharge EARS: No earache, tinnitus, change in hearing NOSE: No congestion, drainage or bleeding  MOUTH/THROAT: No mouth or tooth pain, No sore throat RESPIRATORY: No cough, wheezing, SOB CARDIAC: No chest pain, palpitations, lower extremity edema  GI: No abdominal pain, No N/V/D or constipation, No heartburn or reflux  GU: No dysuria, frequency or urgency, or incontinence  MUSCULOSKELETAL: + unrelieved bone/joint pain NEUROLOGIC: No headache, dizziness or focal weakness PSYCHIATRIC: No c/o anxiety or sadness   Vitals:   12/19/18 1400  BP: (!) 142/70  Pulse: 71  Resp: 16  Temp: 98 F (36.7 C)    SpO2 Readings from Last 1 Encounters:  12/18/18 98%   Body mass index is 23.81 kg/m.     Physical Exam  GENERAL APPEARANCE: Alert, conversant,  No acute distress.  SKIN: Surgical site without excessive swelling or redness with postop dressing in place HEAD: Normocephalic, atraumatic  EYES: Conjunctiva/lids clear. Pupils round, reactive. EOMs intact.  EARS: External exam WNL, canals clear. Hearing grossly normal.  NOSE: No deformity or discharge.  MOUTH/THROAT: Lips w/o lesions  RESPIRATORY: Breathing is even, unlabored. Lung sounds are clear   CARDIOVASCULAR: Heart RRR no murmurs, rubs or gallops. No peripheral edema.   GASTROINTESTINAL: Abdomen is soft, non-tender, not distended w/ normal bowel sounds. GENITOURINARY: Bladder non tender, not distended  MUSCULOSKELETAL: No abnormal joints or musculature NEUROLOGIC:  Cranial nerves 2-12 grossly intact. Moves all extremities  PSYCHIATRIC: Mood and affect with dementia, no behavioral issues  Patient Active Problem List    Diagnosis Date Noted  . Pressure injury of skin 12/18/2018  . Closed left hip fracture, initial encounter ([place]) 12/11/2018  . CKD (chronic kidney disease), stage III ([place]) 12/11/2018  . Depression with anxiety 12/11/2018  . Overactive bladder 09/24/2017  . Insomnia 07/31/2017  . Anxiety 06/16/2017  . Citrobacter infection 03/19/2017  . AKI (acute kidney injury) ([place]) 03/19/2017  . History of pulmonary embolus (PE) 03/19/2017  . Elevated INR 03/19/2017  . Candidal intertrigo 03/19/2017  .  Compression fracture of L2 ([place]) 03/12/2017  . Acute lower UTI 03/12/2017  . Accelerated hypertension 03/12/2017  . Leukocytosis 07/31/2015  . Acute bronchitis 07/28/2015  . Chronic anticoagulation 07/28/2015  . Intractable back pain 07/20/2015  . Back pain 07/20/2015  . Cellulitis and abscess of left buttock 04/12/2013  . Stricture and stenosis of esophagus 10/10/2012  . Dementia ([place]) 10/06/2012  . Dysphagia 10/06/2012  . Axillary abscess - bilateral, multiple 09/27/2012  . Hypotension 08/24/2012  . Near syncope 08/24/2012  . Abscess of axilla, left 06/27/2012  . Bradycardia 06/13/2012  . Abscess 06/11/2012  . Chest pain 06/09/2012  . Cellulitis 06/09/2012  . Weakness of both legs 06/09/2012  . PMR (polymyalgia rheumatica) (HCC)   . Restless leg syndrome   . Angina pectoris, unstable ([place]) 03/02/2012  . Anemia 03/02/2012  . Dyspnea 02/03/2012  . UTI (urinary tract infection) 02/03/2012  . CAD (coronary artery disease)   . HTN (hypertension)   . VTE (venous thromboembolism) 10/01/2010  . HYPERCHOLESTEROLEMIA  IIA 08/27/2009  . Hyperlipidemia 12/11/2008  . HYPERTENSION, BENIGN 12/11/2008      Labs reviewed: Basic Metabolic Panel:    Component Value Date/Time   NA 135 12/17/2018 0417   NA 141 08/01/2018   K 4.1 12/17/2018 0417   CL 105 12/17/2018 0417   CO2 22 12/17/2018 0417   GLUCOSE 104 (H) 12/17/2018 0417   BUN 20 12/17/2018 0417   BUN 19 08/01/2018   CREATININE 0.99  12/17/2018 0417   CALCIUM 7.7 (L) 12/17/2018 0417   PROT 6.7 03/11/2017 2031   ALBUMIN 3.4 (L) 03/11/2017 2031   AST 9 (A) 08/01/2018   ALT 7 08/01/2018   ALKPHOS 58 08/01/2018   BILITOT 0.5 03/11/2017 2031   GFRNONAA 48 (L) 12/17/2018 0417   GFRAA 56 (L) 12/17/2018 0417    Recent Labs    12/14/18 0536 12/15/18 0911 12/16/18 0611 12/17/18 0417  NA 138 138 136 135  K 3.9 3.4* 4.0 4.1  CL 110 108 108 105  CO2 22 23 22 22   GLUCOSE 144* 85 96 104*  BUN 25* 25* 21 20  CREATININE 1.13* 0.81 0.86 0.99  CALCIUM 8.3* 7.8* 7.8* 7.7*  MG 2.1 2.3 2.2  --    Liver Function Tests: Recent Labs    08/01/18  AST 9*  ALT 7  ALKPHOS 58   No results for input(s): LIPASE, AMYLASE in the last 8760 hours. No results for input(s): AMMONIA in the last 8760 hours. CBC: Recent Labs    12/11/18 2308  12/16/18 0611 12/17/18 0417 12/18/18 0408  WBC 14.6*   < > 7.4 7.3 8.2  NEUTROABS 12.6*  --   --  4.7 5.3  HGB 11.0*   < > 7.3* 9.4* 9.5*  HCT 36.4   < > 24.5* 31.3* 31.4*  MCV 83.7   < > 83.3 83.2 83.3  PLT 311   < > 282 265 257   < > = values in this interval not displayed.   Lipid Recent Labs    08/01/18  CHOL 140  HDL 46  LDLCALC 80  TRIG 82    Cardiac Enzymes: No results for input(s): CKTOTAL, CKMB, CKMBINDEX, TROPONINI in the last 8760 hours. BNP: No results for input(s): BNP in the last 8760 hours. No results found for: [facility] Lab Results  Component Value Date   HGBA1C 5.7 08/01/2018   Lab Results  Component Value Date   TSH 1.71 08/01/2018   No results found for: VITAMINB12 No results  found for: FOLATE Lab Results  Component Value Date   IRON 28 (L) 03/02/2012   TIBC 386 03/02/2012   FERRITIN 8 (L) 03/02/2012    Imaging and Procedures obtained prior to SNF admission: Dg Lumbar Spine Complete  Result Date: 12/11/2018 CLINICAL DATA:  Recent fall with low back pain EXAM: LUMBAR SPINE - COMPLETE 4+ VIEW COMPARISON:  03/08/2017 FINDINGS: Stable compression  deformities of L2 and L3 are noted. Degenerative changes are noted worst at L4-5 facet hypertrophic changes are noted. No acute bony abnormality is seen. IMPRESSION: Chronic degenerative changes and compression fractures without acute abnormality. Electronically Signed   By: Inez Catalina M.D.   On: 12/11/2018 22:28   Dg Pelvis 1-2 Views  Result Date: 12/11/2018 CLINICAL DATA:  Recent fall with left leg pain, initial encounter EXAM: PELVIS - 1-2 VIEW COMPARISON:  None. FINDINGS: Pelvic ring is intact. Comminuted intratrochanteric left femoral fracture is noted with impaction and mild angulation at the fracture site. Femoral head is well seated. IMPRESSION: Comminuted intratrochanteric left femoral fracture. Electronically Signed   By: Inez Catalina M.D.   On: 12/11/2018 22:27   Ct Head Wo Contrast  Result Date: 12/11/2018 CLINICAL DATA:  Recent fall EXAM: CT HEAD WITHOUT CONTRAST CT CERVICAL SPINE WITHOUT CONTRAST TECHNIQUE: Multidetector CT imaging of the head and cervical spine was performed following the standard protocol without intravenous contrast. Multiplanar CT image reconstructions of the cervical spine were also generated. COMPARISON:  11/14/15 FINDINGS: CT HEAD FINDINGS Brain: Chronic white matter ischemic changes are noted. No findings to suggest acute hemorrhage, acute infarction or space-occupying mass lesion are noted. Dense lesion is again identified within the sella stable from the prior exam. Vascular: No hyperdense vessel or unexpected calcification. Skull: Normal. Negative for fracture or focal lesion. Sinuses/Orbits: No acute finding. Other: None CT CERVICAL SPINE FINDINGS Alignment: Within normal limits. Skull base and vertebrae: 7 cervical segments are well visualized. Vertebral body height is well maintained. No acute fracture or acute facet abnormality is noted. Facet hypertrophic changes are seen. No significant neural foraminal narrowing is noted. Soft tissues and spinal canal:  Surrounding soft tissues are unremarkable. Upper chest: Visualized upper lung fields are within normal limits. Other: None IMPRESSION: CT of the head: No change from the prior exam. No acute abnormality noted. CT of the cervical spine: Multilevel degenerative change without acute abnormality. Electronically Signed   By: Inez Catalina M.D.   On: 12/11/2018 22:16   Ct Cervical Spine Wo Contrast  Result Date: 12/11/2018 CLINICAL DATA:  Recent fall EXAM: CT HEAD WITHOUT CONTRAST CT CERVICAL SPINE WITHOUT CONTRAST TECHNIQUE: Multidetector CT imaging of the head and cervical spine was performed following the standard protocol without intravenous contrast. Multiplanar CT image reconstructions of the cervical spine were also generated. COMPARISON:  11/14/15 FINDINGS: CT HEAD FINDINGS Brain: Chronic white matter ischemic changes are noted. No findings to suggest acute hemorrhage, acute infarction or space-occupying mass lesion are noted. Dense lesion is again identified within the sella stable from the prior exam. Vascular: No hyperdense vessel or unexpected calcification. Skull: Normal. Negative for fracture or focal lesion. Sinuses/Orbits: No acute finding. Other: None CT CERVICAL SPINE FINDINGS Alignment: Within normal limits. Skull base and vertebrae: 7 cervical segments are well visualized. Vertebral body height is well maintained. No acute fracture or acute facet abnormality is noted. Facet hypertrophic changes are seen. No significant neural foraminal narrowing is noted. Soft tissues and spinal canal: Surrounding soft tissues are unremarkable. Upper chest: Visualized upper lung fields are within  normal limits. Other: None IMPRESSION: CT of the head: No change from the prior exam. No acute abnormality noted. CT of the cervical spine: Multilevel degenerative change without acute abnormality. Electronically Signed   By: Inez Catalina M.D.   On: 12/11/2018 22:16   Chest Portable 1 View  Result Date:  12/12/2018 CLINICAL DATA:  Preop fractured hip EXAM: PORTABLE CHEST 1 VIEW COMPARISON:  03/11/2017 FINDINGS: Elevation of the right hemidiaphragm. No confluent airspace opacities or effusions. Heart is normal size. No acute bony abnormality. IMPRESSION: Stable chronic elevation of the right hemidiaphragm. No active disease. Electronically Signed   By: Rolm Baptise M.D.   On: 12/12/2018 00:30   Dg Femur Min 2 Views Left  Result Date: 12/11/2018 CLINICAL DATA:  Recent fall with left leg pain, initial encounter EXAM: LEFT FEMUR 2 VIEWS COMPARISON:  None. FINDINGS: Comminuted left intratrochanteric femoral fracture is noted. Some impaction and angulation at the fracture site is noted. No other fracture is seen. IMPRESSION: Left intratrochanteric femoral fracture. Electronically Signed   By: Inez Catalina M.D.   On: 12/11/2018 22:29   [place] Ekg Site Rite  Result Date: 12/12/2018 If Site Rite image not attached, placement could not be confirmed due to current cardiac rhythm.    Not all labs, radiology exams or other studies done during hospitalization come through on my EPIC note; however they are reviewed by me.    Assessment and Plan  Left intertrochanteric hip fracture- Ortho performed surgery on 2/12; will be prophylaxed with Coumadin taken patient takes for DVT/PE SNF-admit for OT/PT and for residential care; patient pain is not controlled by tramadol, use oxycodone for patient was sent out, will restart oxycodone 5 mg every 8 scheduled hold for somnolence and scheduled Tylenol 650 every 6  Acute blood loss anemia postop-patient is status post 1 unit PRBC on 2/15 for hemoglobin of 7.3 which came up to 9.4 SNF- follow-up CBC  History DVT/PE- INR became supratherapeutic while in the hospital, patient was given vitamin K and it came back down to 1.4 which is subtherapeutic, starting again at 2 mg daily SNF- start Coumadin at 2 mg daily; will recheck on Thursday to make sure is not out  Polymyalgia  rheumatica SNF- controlled at this time; on prednisone 1 mg daily  Hypertension SNF- controlled; continue losartan 50 mg daily and Norvasc 10 mg; patient's hydralazine was stopped will monitor; will monitor   Time spent greater than 45 minutes;> 50% of time with patient was spent reviewing records, labs, tests and studies, counseling and developing plan of care  Webb Silversmith D. [name], MD

## 2018-12-20 ENCOUNTER — Encounter: Payer: Self-pay | Admitting: Internal Medicine

## 2018-12-20 MED ORDER — OXYCODONE HCL 5 MG PO CAPS: 5.0000 mg | ORAL_CAPSULE | Freq: Three times a day (TID) | ORAL | 0 refills | Status: DC

## 2018-12-26 ENCOUNTER — Non-Acute Institutional Stay (SKILLED_NURSING_FACILITY): Payer: Medicare Other | Admitting: Internal Medicine

## 2018-12-26 DIAGNOSIS — J189 Pneumonia, unspecified organism: Secondary | ICD-10-CM | POA: Diagnosis not present

## 2018-12-26 DIAGNOSIS — F418 Other specified anxiety disorders: Secondary | ICD-10-CM | POA: Diagnosis not present

## 2018-12-26 DIAGNOSIS — F0390 Unspecified dementia without behavioral disturbance: Secondary | ICD-10-CM | POA: Diagnosis not present

## 2018-12-26 DIAGNOSIS — F331 Major depressive disorder, recurrent, moderate: Secondary | ICD-10-CM | POA: Diagnosis not present

## 2018-12-27 LAB — BASIC METABOLIC PANEL
BUN: 32 — AB (ref 4–21)
Creatinine: 0.9 (ref 0.5–1.1)
Glucose: 94
Potassium: 3.6 (ref 3.4–5.3)
Sodium: 142 (ref 137–147)

## 2018-12-27 LAB — CBC AND DIFFERENTIAL
HCT: 28 — AB (ref 36–46)
Hemoglobin: 9 — AB (ref 12.0–16.0)
Platelets: 404 — AB (ref 150–399)
WBC: 12.2

## 2018-12-31 ENCOUNTER — Encounter: Payer: Self-pay | Admitting: Internal Medicine

## 2018-12-31 NOTE — Progress Notes (Signed)
Location:  [facility] Service:  SNF (31)  Hennie Duos, MD  Patient Care Team: Hennie Duos, MD as PCP - General (Internal Medicine)  Extended Emergency Contact Information Primary Emergency Contact: Holbein,[name] Address: [address]          [place], [place] [address] [name] of [place] Phone: [phone] Mobile Phone: [phone] Relation: Son Secondary Emergency Contact: [place] of [place] Phone: [phone] Relation: Daughter    Allergies: Ambien [zolpidem tartrate]; Codeine phosphate; Darvocet [propoxyphene n-acetaminophen]; Fish-derived products; Promethazine hcl; Sulfamethoxazole; Vicodin [hydrocodone-acetaminophen]; Amoxicillin; and Penicillins  Chief Complaint  Patient presents with  . Acute Visit    HPI: Patient is 85 y.o. female who is being seen for pneumonia.  Patient has had a cough for several days and her O2 saturation has been lower than usual, 93% room air.  On exam rhonchi were heard on the left.  A chest x-ray was ordered.  Patient denies chest pain or shortness of breath.  Patient's had no fever  Past Medical History:  Diagnosis Date  . Anemia 03/02/2012  . Angina pectoris, unstable ([place]) 03/02/2012  . Anxiety   . Axillary abscess   . Bradycardia 06/13/2012  . CAD (coronary artery disease)    S/p PTCA / stenting (last cath 2004, multiple LAD stents, 2 stents in the right coronary artery all patent)  . Cancer ([place])   . Complication of anesthesia   . Compression fracture of L2 ([place]) 03/12/2017  . Dementia ([place])   . Dysphagia 10/06/2012  . Dyspnea 02/03/2012  . GERD (gastroesophageal reflux disease)   . History of pulmonary embolus (PE) 03/19/2017  . HTN (hypertension)   . PMR (polymyalgia rheumatica) (HCC)   . PONV (postoperative nausea and vomiting)   . Restless leg syndrome   . 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 Started coumadin  . Weakness of both  legs 06/09/2012    Past Surgical History:  Procedure Laterality Date  . BREAST LUMPECTOMY    . COLECTOMY    . ESOPHAGOGASTRODUODENOSCOPY (EGD) WITH ESOPHAGEAL DILATION  10/10/2012   Procedure: ESOPHAGOGASTRODUODENOSCOPY (EGD) WITH ESOPHAGEAL DILATION;  Surgeon: Inda Castle, MD;  Location: [facility];  Service: Endoscopy;  Laterality: N/A;  . FEMUR IM NAIL Left 12/13/2018   Procedure: INTRAMEDULLARY (IM) NAIL FEMORAL LEFT;  Surgeon: Paralee Cancel, MD;  Location: [facility];  Service: Orthopedics;  Laterality: Left;  . heart stents  x 8  . INCISION AND DRAINAGE     bilateral axillary, non specific staff  . INCISION AND DRAINAGE ABSCESS  09/28/2012   Procedure: INCISION AND DRAINAGE ABSCESS;  Surgeon: Zenovia Jarred, MD;  Location: [facility];  Service: General;  Laterality: Bilateral;  . stent     cardiac x 8 stents.    Allergies as of 12/26/2018      Reactions   Ambien [zolpidem Tartrate] Nausea And Vomiting   Codeine Phosphate Nausea And Vomiting   Darvocet [propoxyphene N-acetaminophen] Nausea And Vomiting   Fish-derived Products    Promethazine Hcl Nausea And Vomiting   Sulfamethoxazole Other (See Comments)   Pt doesn't remember reaction   Vicodin [hydrocodone-acetaminophen] Other (See Comments)   Unknown reaction   Amoxicillin Nausea And Vomiting, Rash   Penicillins Nausea And Vomiting, Rash      Medication List       Accurate as of December 26, 2018 11:59 PM. Always use your most recent med list.  acetaminophen 325 MG tablet Commonly known as:  TYLENOL Take 1-2 tablets (325-650 mg total) by mouth every 6 (six) hours as needed for mild pain (pain score 1-3 or temp > 100.5).   ALPRAZolam 0.5 MG tablet Commonly known as:  XANAX Take 1 tablet (0.5 mg total) by mouth daily.   amLODipine 10 MG tablet Commonly known as:  NORVASC Take 10 mg by mouth every morning.   aspirin EC 81 MG tablet Take 1 tablet (81 mg total) by mouth daily.   atorvastatin 10 MG  tablet Commonly known as:  LIPITOR Take 10 mg by mouth every evening.   cholecalciferol 1000 units tablet Commonly known as:  VITAMIN D Take 2,000 Units by mouth daily with lunch. 2 tabs   donepezil 10 MG tablet Commonly known as:  ARICEPT Take 10 mg by mouth every evening.   ENEMA RE Place 1 enema rectally daily as needed (constipation).   losartan 50 MG tablet Commonly known as:  COZAAR Take 50 mg by mouth at bedtime.   magnesium hydroxide 400 MG/5ML suspension Commonly known as:  MILK OF MAGNESIA Take 30 mLs by mouth daily as needed for mild constipation.   mirtazapine 15 MG tablet Commonly known as:  REMERON Take 0.5 tablets (7.5 mg total) by mouth at bedtime.   nitroGLYCERIN 0.4 MG SL tablet Commonly known as:  NITROSTAT Place 0.4 mg under the tongue every 5 (five) minutes as needed for chest pain.   nystatin powder Commonly known as:  MYCOSTATIN/NYSTOP Apply topically 2 (two) times daily.   ondansetron 4 MG tablet Commonly known as:  ZOFRAN Take 4 mg by mouth every 4 (four) hours as needed for nausea.   oxybutynin 5 MG tablet Commonly known as:  DITROPAN Take 5 mg by mouth 2 (two) times daily.   oxycodone 5 MG capsule Commonly known as:  OXY-IR Take 1 capsule (5 mg total) by mouth every 8 (eight) hours.   polyethylene glycol packet Commonly known as:  MIRALAX / GLYCOLAX Take 17 g by mouth daily as needed for moderate constipation.   predniSONE 1 MG tablet Commonly known as:  DELTASONE Take 1 mg by mouth daily with breakfast.   PROAIR HFA 108 (90 Base) MCG/ACT inhaler Generic drug:  albuterol Inhale 2 puffs into the lungs every 6 (six) hours as needed for wheezing or shortness of breath.   rOPINIRole 1 MG tablet Commonly known as:  REQUIP Take 1 mg by mouth at bedtime. 1mg  at noon, 1mg  2 hours before bed, then 1mg  at bedtime   senna-docusate 8.6-50 MG tablet Commonly known as:  Senokot-S Take 1 tablet by mouth 2 (two) times daily.   SILENOR 6 MG  Tabs Generic drug:  Doxepin HCl Take 0.5 tablets (3 mg total) by mouth at bedtime.   traMADol 50 MG tablet Commonly known as:  ULTRAM Take 1 tablet (50 mg total) by mouth at bedtime.   TUMS 500 MG chewable tablet Generic drug:  calcium carbonate Chew 1 tablet by mouth 3 (three) times daily as needed for indigestion or heartburn.   warfarin 2 MG tablet Commonly known as:  COUMADIN Take 1.5 tablets (3 mg total) by mouth daily.       No orders of the defined types were placed in this encounter.   Immunization History  Administered Date(s) Administered  . Influenza-Unspecified 08/01/2016, 08/11/2017  . PPD Test 03/18/2017  . Pneumococcal Conjugate-13 09/17/2017    Social History   Tobacco Use  . Smoking status: Never Smoker  .  Smokeless tobacco: Never Used  Substance Use Topics  . Alcohol use: No    Review of Systems  DATA OBTAINED: from patient, nurse GENERAL:  no fevers, +fatigue, appetite changes SKIN: No itching, rash HEENT: No complaint RESPIRATORY: + cough, no wheezing, no SOB CARDIAC: No chest pain, palpitations, lower extremity edema  GI: No abdominal pain, No N/V/D or constipation, No heartburn or reflux  GU: No dysuria, frequency or urgency, or incontinence  MUSCULOSKELETAL: No unrelieved bone/joint pain NEUROLOGIC: No headache, dizziness  PSYCHIATRIC: No overt anxiety or sadness  Vitals:   12/31/18 1227  BP: 130/64  Pulse: 77  Resp: 16  Temp: 98.2 F (36.8 C)   Body mass index is 24.58 kg/m. Physical Exam  GENERAL APPEARANCE: Alert, conversant, No acute distress  SKIN: No diaphoresis rash HEENT: Unremarkable RESPIRATORY: Breathing is even, unlabored. Lung sounds are rales at left base CARDIOVASCULAR: Heart RRR no murmurs, rubs or gallops. No peripheral edema  GASTROINTESTINAL: Abdomen is soft, non-tender, not distended w/ normal bowel sounds.  GENITOURINARY: Bladder non tender, not distended  MUSCULOSKELETAL: No abnormal joints or  musculature NEUROLOGIC: Cranial nerves 2-12 grossly intact. Moves all extremities PSYCHIATRIC: Mood and affect with dementia, no behavioral issues  Patient Active Problem List   Diagnosis Date Noted  . Pressure injury of skin 12/18/2018  . Intertrochanteric fracture of left femur ([place]) 12/11/2018  . CKD (chronic kidney disease), stage III ([place]) 12/11/2018  . Depression with anxiety 12/11/2018  . Overactive bladder 09/24/2017  . Insomnia 07/31/2017  . Anxiety 06/16/2017  . Citrobacter infection 03/19/2017  . AKI (acute kidney injury) ([place]) 03/19/2017  . History of pulmonary embolus (PE) 03/19/2017  . Elevated INR 03/19/2017  . Candidal intertrigo 03/19/2017  . Compression fracture of L2 ([place]) 03/12/2017  . Acute lower UTI 03/12/2017  . Accelerated hypertension 03/12/2017  . Leukocytosis 07/31/2015  . Acute bronchitis 07/28/2015  . Chronic anticoagulation 07/28/2015  . Intractable back pain 07/20/2015  . Back pain 07/20/2015  . Cellulitis and abscess of left buttock 04/12/2013  . Stricture and stenosis of esophagus 10/10/2012  . Dementia ([place]) 10/06/2012  . Dysphagia 10/06/2012  . Axillary abscess - bilateral, multiple 09/27/2012  . Hypotension 08/24/2012  . Near syncope 08/24/2012  . Abscess of axilla, left 06/27/2012  . Bradycardia 06/13/2012  . Abscess 06/11/2012  . Chest pain 06/09/2012  . Cellulitis 06/09/2012  . Weakness of both legs 06/09/2012  . PMR (polymyalgia rheumatica) (HCC)   . Restless leg syndrome   . Angina pectoris, unstable ([place]) 03/02/2012  . Acute blood loss as cause of postoperative anemia 03/02/2012  . Dyspnea 02/03/2012  . UTI (urinary tract infection) 02/03/2012  . CAD (coronary artery disease)   . HTN (hypertension)   . VTE (venous thromboembolism) 10/01/2010  . HYPERCHOLESTEROLEMIA  IIA 08/27/2009  . Hyperlipidemia 12/11/2008  . HYPERTENSION, BENIGN 12/11/2008    CMP     Component Value Date/Time   NA 142 12/27/2018   K 3.6 12/27/2018    CL 105 12/17/2018 0417   CO2 22 12/17/2018 0417   GLUCOSE 104 (H) 12/17/2018 0417   BUN 32 (A) 12/27/2018   CREATININE 0.9 12/27/2018   CREATININE 0.99 12/17/2018 0417   CALCIUM 7.7 (L) 12/17/2018 0417   PROT 6.7 03/11/2017 2031   ALBUMIN 3.4 (L) 03/11/2017 2031   AST 9 (A) 08/01/2018   ALT 7 08/01/2018   ALKPHOS 58 08/01/2018   BILITOT 0.5 03/11/2017 2031   GFRNONAA 48 (L) 12/17/2018 0417   GFRAA 56 (L) 12/17/2018 1740  Recent Labs    12/14/18 0536 12/15/18 0911 12/16/18 0611 12/17/18 0417 12/27/18  NA 138 138 136 135 142  K 3.9 3.4* 4.0 4.1 3.6  CL 110 108 108 105  --   CO2 22 23 22 22   --   GLUCOSE 144* 85 96 104*  --   BUN 25* 25* 21 20 32*  CREATININE 1.13* 0.81 0.86 0.99 0.9  CALCIUM 8.3* 7.8* 7.8* 7.7*  --   MG 2.1 2.3 2.2  --   --    Recent Labs    08/01/18  AST 9*  ALT 7  ALKPHOS 58   Recent Labs    12/11/18 2308  12/16/18 0611 12/17/18 0417 12/18/18 0408 12/27/18  WBC 14.6*   < > 7.4 7.3 8.2 12.2  NEUTROABS 12.6*  --   --  4.7 5.3  --   HGB 11.0*   < > 7.3* 9.4* 9.5* 9.0*  HCT 36.4   < > 24.5* 31.3* 31.4* 28*  MCV 83.7   < > 83.3 83.2 83.3  --   PLT 311   < > 282 265 257 404*   < > = values in this interval not displayed.   Recent Labs    08/01/18  CHOL 140  LDLCALC 80  TRIG 82   No results found for: [facility] Lab Results  Component Value Date   TSH 1.71 08/01/2018   Lab Results  Component Value Date   HGBA1C 5.7 08/01/2018   Lab Results  Component Value Date   CHOL 140 08/01/2018   HDL 46 08/01/2018   LDLCALC 80 08/01/2018   TRIG 82 08/01/2018   CHOLHDL 2.4 03/03/2012    Significant Diagnostic Results in last 30 days:  Dg Lumbar Spine Complete  Result Date: 12/11/2018 CLINICAL DATA:  Recent fall with low back pain EXAM: LUMBAR SPINE - COMPLETE 4+ VIEW COMPARISON:  03/08/2017 FINDINGS: Stable compression deformities of L2 and L3 are noted. Degenerative changes are noted worst at L4-5 facet hypertrophic changes are noted.  No acute bony abnormality is seen. IMPRESSION: Chronic degenerative changes and compression fractures without acute abnormality. Electronically Signed   By: Inez Catalina M.D.   On: 12/11/2018 22:28   Dg Pelvis 1-2 Views  Result Date: 12/11/2018 CLINICAL DATA:  Recent fall with left leg pain, initial encounter EXAM: PELVIS - 1-2 VIEW COMPARISON:  None. FINDINGS: Pelvic ring is intact. Comminuted intratrochanteric left femoral fracture is noted with impaction and mild angulation at the fracture site. Femoral head is well seated. IMPRESSION: Comminuted intratrochanteric left femoral fracture. Electronically Signed   By: Inez Catalina M.D.   On: 12/11/2018 22:27   Ct Head Wo Contrast  Result Date: 12/11/2018 CLINICAL DATA:  Recent fall EXAM: CT HEAD WITHOUT CONTRAST CT CERVICAL SPINE WITHOUT CONTRAST TECHNIQUE: Multidetector CT imaging of the head and cervical spine was performed following the standard protocol without intravenous contrast. Multiplanar CT image reconstructions of the cervical spine were also generated. COMPARISON:  11/14/15 FINDINGS: CT HEAD FINDINGS Brain: Chronic white matter ischemic changes are noted. No findings to suggest acute hemorrhage, acute infarction or space-occupying mass lesion are noted. Dense lesion is again identified within the sella stable from the prior exam. Vascular: No hyperdense vessel or unexpected calcification. Skull: Normal. Negative for fracture or focal lesion. Sinuses/Orbits: No acute finding. Other: None CT CERVICAL SPINE FINDINGS Alignment: Within normal limits. Skull base and vertebrae: 7 cervical segments are well visualized. Vertebral body height is well maintained. No acute fracture or acute facet  abnormality is noted. Facet hypertrophic changes are seen. No significant neural foraminal narrowing is noted. Soft tissues and spinal canal: Surrounding soft tissues are unremarkable. Upper chest: Visualized upper lung fields are within normal limits. Other: None  IMPRESSION: CT of the head: No change from the prior exam. No acute abnormality noted. CT of the cervical spine: Multilevel degenerative change without acute abnormality. Electronically Signed   By: Inez Catalina M.D.   On: 12/11/2018 22:16   Ct Cervical Spine Wo Contrast  Result Date: 12/11/2018 CLINICAL DATA:  Recent fall EXAM: CT HEAD WITHOUT CONTRAST CT CERVICAL SPINE WITHOUT CONTRAST TECHNIQUE: Multidetector CT imaging of the head and cervical spine was performed following the standard protocol without intravenous contrast. Multiplanar CT image reconstructions of the cervical spine were also generated. COMPARISON:  11/14/15 FINDINGS: CT HEAD FINDINGS Brain: Chronic white matter ischemic changes are noted. No findings to suggest acute hemorrhage, acute infarction or space-occupying mass lesion are noted. Dense lesion is again identified within the sella stable from the prior exam. Vascular: No hyperdense vessel or unexpected calcification. Skull: Normal. Negative for fracture or focal lesion. Sinuses/Orbits: No acute finding. Other: None CT CERVICAL SPINE FINDINGS Alignment: Within normal limits. Skull base and vertebrae: 7 cervical segments are well visualized. Vertebral body height is well maintained. No acute fracture or acute facet abnormality is noted. Facet hypertrophic changes are seen. No significant neural foraminal narrowing is noted. Soft tissues and spinal canal: Surrounding soft tissues are unremarkable. Upper chest: Visualized upper lung fields are within normal limits. Other: None IMPRESSION: CT of the head: No change from the prior exam. No acute abnormality noted. CT of the cervical spine: Multilevel degenerative change without acute abnormality. Electronically Signed   By: Inez Catalina M.D.   On: 12/11/2018 22:16   Chest Portable 1 View  Result Date: 12/12/2018 CLINICAL DATA:  Preop fractured hip EXAM: PORTABLE CHEST 1 VIEW COMPARISON:  03/11/2017 FINDINGS: Elevation of the right  hemidiaphragm. No confluent airspace opacities or effusions. Heart is normal size. No acute bony abnormality. IMPRESSION: Stable chronic elevation of the right hemidiaphragm. No active disease. Electronically Signed   By: Rolm Baptise M.D.   On: 12/12/2018 00:30   Dg C-arm 1-60 Min-no Report  Result Date: 12/13/2018 Fluoroscopy was utilized by the requesting physician.  No radiographic interpretation.   Dg Femur Min 2 Views Left  Result Date: 12/11/2018 CLINICAL DATA:  Recent fall with left leg pain, initial encounter EXAM: LEFT FEMUR 2 VIEWS COMPARISON:  None. FINDINGS: Comminuted left intratrochanteric femoral fracture is noted. Some impaction and angulation at the fracture site is noted. No other fracture is seen. IMPRESSION: Left intratrochanteric femoral fracture. Electronically Signed   By: Inez Catalina M.D.   On: 12/11/2018 22:29   Dg Femur, Min 2 Views Right  Result Date: 12/13/2018 CLINICAL DATA:  Status post surgical fixation of proximal left femoral fracture. EXAM: RIGHT FEMUR 2 VIEWS Radiation exposure index: 6.5328 mGy. COMPARISON:  Radiograph of December 11, 2018. FINDINGS: Four intraoperative fluoroscopic images demonstrate surgical internal fixation of proximal left femoral inter trochanteric fracture. Good alignment of fracture components is noted. IMPRESSION: Status post surgical internal fixation of proximal left femoral intertrochanteric fracture. Electronically Signed   By: Marijo Conception, M.D.   On: 12/13/2018 14:59   [place] Ekg Site Rite  Result Date: 12/12/2018 If Site Rite image not attached, placement could not be confirmed due to current cardiac rhythm.   Assessment and Plan  HCAP -patient with cough and rales; chest x-ray  was ordered and returned with left lower lobe infiltrate; patient was started on DuoNeb 3 times daily scheduled for 7 days; O2 at night for 7 days and Levaquin 500 mg daily for 7 days    Time spent greater than 35 minutes Inocencio Homes, MD

## 2019-01-03 DIAGNOSIS — S72142D Displaced intertrochanteric fracture of left femur, subsequent encounter for closed fracture with routine healing: Secondary | ICD-10-CM | POA: Diagnosis not present

## 2019-01-03 DIAGNOSIS — Z5189 Encounter for other specified aftercare: Secondary | ICD-10-CM | POA: Diagnosis not present

## 2019-01-08 ENCOUNTER — Encounter: Payer: Self-pay | Admitting: Internal Medicine

## 2019-01-08 DIAGNOSIS — R627 Adult failure to thrive: Secondary | ICD-10-CM | POA: Insufficient documentation

## 2019-01-08 DIAGNOSIS — E440 Moderate protein-calorie malnutrition: Secondary | ICD-10-CM | POA: Insufficient documentation

## 2019-01-12 ENCOUNTER — Other Ambulatory Visit: Payer: Self-pay

## 2019-01-16 ENCOUNTER — Other Ambulatory Visit: Payer: Self-pay

## 2019-01-16 MED ORDER — TRAMADOL HCL 50 MG PO TABS: 50.0000 mg | ORAL_TABLET | Freq: Every day | ORAL | 0 refills | Status: DC

## 2019-01-18 ENCOUNTER — Non-Acute Institutional Stay (SKILLED_NURSING_FACILITY): Payer: Medicare Other | Admitting: Internal Medicine

## 2019-01-18 ENCOUNTER — Encounter: Payer: Self-pay | Admitting: Internal Medicine

## 2019-01-18 DIAGNOSIS — F0280 Dementia in other diseases classified elsewhere without behavioral disturbance: Secondary | ICD-10-CM

## 2019-01-18 DIAGNOSIS — M353 Polymyalgia rheumatica: Secondary | ICD-10-CM | POA: Diagnosis not present

## 2019-01-18 DIAGNOSIS — G301 Alzheimer's disease with late onset: Secondary | ICD-10-CM

## 2019-01-18 DIAGNOSIS — E785 Hyperlipidemia, unspecified: Secondary | ICD-10-CM

## 2019-01-18 NOTE — Progress Notes (Signed)
Location:  [facility] Room Number: [phone] Place of Service:  SNF [phone])  [name] [name]. [name] Coil, MD  Patient Care Team: [name] Duos, MD as PCP - General (Internal Medicine)  Extended Emergency Contact Information Primary Emergency Contact: [name] [name] Address: [address]          [place], [place] [address] [name] [name] of [place] Phone: [phone] Mobile Phone: [phone] Relation: Son Secondary Emergency Contact: [name] [name] of [place] Phone: [phone] Relation: Daughter    Allergies: Ambien [zolpidem tartrate]; Codeine phosphate; Darvocet [propoxyphene n-acetaminophen]; Fish-derived products; Promethazine hcl; Sulfamethoxazole; Vicodin [hydrocodone-acetaminophen]; Amoxicillin; and Penicillins  Chief Complaint  Patient presents with  . Medical Management of Chronic Issues    Routine visit    HPI: Patient is 85 y.o. female who is being seen for routine issues of dementia, hyperlipidemia, and polymyalgia rheumatica.  Past Medical History:  Diagnosis Date  . Anemia 03/02/2012  . Angina pectoris, unstable ([place]) 03/02/2012  . Anxiety   . Axillary abscess   . Bradycardia 06/13/2012  . CAD (coronary artery disease)    S/p PTCA / stenting (last cath 2004, multiple LAD stents, 2 stents in the right coronary artery all patent)  . Cancer ([place])   . Complication of anesthesia   . Compression fracture of L2 ([place]) 03/12/2017  . Dementia ([place])   . Dysphagia 10/06/2012  . Dyspnea 02/03/2012  . GERD (gastroesophageal reflux disease)   . History of pulmonary embolus (PE) 03/19/2017  . HTN (hypertension)   . PMR (polymyalgia rheumatica) (HCC)   . PONV (postoperative nausea and vomiting)   . Restless leg syndrome   . 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 Started coumadin  . Weakness of both legs 06/09/2012    Past Surgical History:  Procedure Laterality Date  . BREAST LUMPECTOMY    . COLECTOMY    .  ESOPHAGOGASTRODUODENOSCOPY (EGD) WITH ESOPHAGEAL DILATION  10/10/2012   Procedure: ESOPHAGOGASTRODUODENOSCOPY (EGD) WITH ESOPHAGEAL DILATION;  Surgeon: Inda Castle, MD;  Location: [facility];  Service: Endoscopy;  Laterality: N/A;  . FEMUR IM NAIL Left 12/13/2018   Procedure: INTRAMEDULLARY (IM) NAIL FEMORAL LEFT;  Surgeon: Paralee Cancel, MD;  Location: [facility];  Service: Orthopedics;  Laterality: Left;  . heart stents  x 8  . INCISION AND DRAINAGE     bilateral axillary, non specific staff  . INCISION AND DRAINAGE ABSCESS  09/28/2012   Procedure: INCISION AND DRAINAGE ABSCESS;  Surgeon: Zenovia Jarred, MD;  Location: [facility];  Service: General;  Laterality: Bilateral;  . stent     cardiac x 8 stents.    Allergies as of 01/18/2019      Reactions   Ambien [zolpidem Tartrate] Nausea And Vomiting   Codeine Phosphate Nausea And Vomiting   Darvocet [propoxyphene N-acetaminophen] Nausea And Vomiting   Fish-derived Products    Promethazine Hcl Nausea And Vomiting   Sulfamethoxazole Other (See Comments)   Pt doesn't remember reaction   Vicodin [hydrocodone-acetaminophen] Other (See Comments)   Unknown reaction   Amoxicillin Nausea And Vomiting, Rash   Penicillins Nausea And Vomiting, Rash      Medication List       Accurate as of January 18, 2019 11:59 PM. Always use your most recent med list.        acetaminophen 325 MG tablet Commonly known as:  TYLENOL Take 1-2 tablets (325-650 mg total) by mouth every 6 (six) hours as needed for mild pain (  pain score 1-3 or temp > 100.5).   ALPRAZolam 0.5 MG tablet Commonly known as:  XANAX Take 1 tablet (0.5 mg total) by mouth daily.   amLODipine 10 MG tablet Commonly known as:  NORVASC Take 10 mg by mouth every morning.   aspirin EC 81 MG tablet Take 1 tablet (81 mg total) by mouth daily.   atorvastatin 10 MG tablet Commonly known as:  LIPITOR Take 10 mg by mouth every evening.   cholecalciferol 1000 units tablet Commonly known  as:  VITAMIN D Take 2,000 Units by mouth daily with lunch. 2 tabs   donepezil 10 MG tablet Commonly known as:  ARICEPT Take 10 mg by mouth every evening.   ENEMA RE Place 1 enema rectally daily as needed (constipation).   losartan 50 MG tablet Commonly known as:  COZAAR Take 50 mg by mouth at bedtime.   magnesium hydroxide 400 MG/5ML suspension Commonly known as:  MILK OF MAGNESIA Take 30 mLs by mouth daily as needed for mild constipation.   mirtazapine 15 MG tablet Commonly known as:  REMERON Take 0.5 tablets (7.5 mg total) by mouth at bedtime.   nitroGLYCERIN 0.4 MG SL tablet Commonly known as:  NITROSTAT Place 0.4 mg under the tongue every 5 (five) minutes as needed for chest pain.   ondansetron 4 MG tablet Commonly known as:  ZOFRAN Take 4 mg by mouth every 4 (four) hours as needed for nausea.   oxybutynin 5 MG tablet Commonly known as:  DITROPAN Take 5 mg by mouth 2 (two) times daily.   polyethylene glycol packet Commonly known as:  MIRALAX / GLYCOLAX Take 17 g by mouth daily as needed for moderate constipation.   predniSONE 1 MG tablet Commonly known as:  DELTASONE Take 1 mg by mouth daily with breakfast.   ProAir HFA 108 (90 Base) MCG/ACT inhaler Generic drug:  albuterol Inhale 2 puffs into the lungs every 6 (six) hours as needed for wheezing or shortness of breath.   rOPINIRole 1 MG tablet Commonly known as:  REQUIP Take 1 mg by mouth at bedtime. 1mg  at noon, 1mg  2 hours before bed, then 1mg  at bedtime   senna-docusate 8.6-50 MG tablet Commonly known as:  Senokot-S Take 1 tablet by mouth 2 (two) times daily.   Silenor 6 MG Tabs Generic drug:  Doxepin HCl Take 0.5 tablets (3 mg total) by mouth at bedtime.   traMADol 50 MG tablet Commonly known as:  ULTRAM Take 1 tablet (50 mg total) by mouth at bedtime.   Tums 500 MG chewable tablet Generic drug:  calcium carbonate Chew 1 tablet by mouth 3 (three) times daily as needed for indigestion or  heartburn.       No orders of the defined types were placed in this encounter.   Immunization History  Administered Date(s) Administered  . Influenza-Unspecified 08/01/2016, 08/11/2017  . PPD Test 03/18/2017  . Pneumococcal Conjugate-13 09/17/2017    Social History   Tobacco Use  . Smoking status: Never Smoker  . Smokeless tobacco: Never Used  Substance Use Topics  . Alcohol use: No    Review of Systems  DATA OBTAINED: from patient, nurse GENERAL:  no fevers, fatigue, appetite changes SKIN: No itching, rash HEENT: No complaint RESPIRATORY: No cough, wheezing, SOB CARDIAC: No chest pain, palpitations, lower extremity edema  GI: No abdominal pain, No N/V/D or constipation, No heartburn or reflux  GU: No dysuria, frequency or urgency, or incontinence  MUSCULOSKELETAL: No unrelieved bone/joint pain NEUROLOGIC: No headache,  dizziness  PSYCHIATRIC: No overt anxiety or sadness  Vitals:   01/18/19 0939  BP: (!) 147/69  Pulse: 60  Resp: 20  Temp: 98.5 F (36.9 C)   Body mass index is 23.73 kg/m. Physical Exam  GENERAL APPEARANCE: Alert, conversant, No acute distress  SKIN: No diaphoresis rash HEENT: Unremarkable RESPIRATORY: Breathing is even, unlabored. Lung sounds are clear   CARDIOVASCULAR: Heart RRR no murmurs, rubs or gallops. No peripheral edema  GASTROINTESTINAL: Abdomen is soft, non-tender, not distended w/ normal bowel sounds.  GENITOURINARY: Bladder non tender, not distended  MUSCULOSKELETAL: No abnormal joints or musculature NEUROLOGIC: Cranial nerves 2-12 grossly intact. Moves all extremities PSYCHIATRIC: Mood and affect dementia  Patient Active Problem List   Diagnosis Date Noted  . Failure to thrive in adult 01/08/2019  . Moderate protein malnutrition ([place]) 01/08/2019  . Pressure injury of skin 12/18/2018  . Intertrochanteric fracture of left femur ([place]) 12/11/2018  . CKD (chronic kidney disease), stage III ([place]) 12/11/2018  . Depression with  anxiety 12/11/2018  . Overactive bladder 09/24/2017  . Insomnia 07/31/2017  . Anxiety 06/16/2017  . Citrobacter infection 03/19/2017  . AKI (acute kidney injury) ([place]) 03/19/2017  . History of pulmonary embolus (PE) 03/19/2017  . Elevated INR 03/19/2017  . Candidal intertrigo 03/19/2017  . Compression fracture of L2 ([place]) 03/12/2017  . Acute lower UTI 03/12/2017  . Accelerated hypertension 03/12/2017  . Leukocytosis 07/31/2015  . Acute bronchitis 07/28/2015  . Chronic anticoagulation 07/28/2015  . Intractable back pain 07/20/2015  . Back pain 07/20/2015  . Cellulitis and abscess of left buttock 04/12/2013  . Stricture and stenosis of esophagus 10/10/2012  . Dementia ([place]) 10/06/2012  . Dysphagia 10/06/2012  . Axillary abscess - bilateral, multiple 09/27/2012  . Hypotension 08/24/2012  . Near syncope 08/24/2012  . Abscess of axilla, left 06/27/2012  . Bradycardia 06/13/2012  . Abscess 06/11/2012  . Chest pain 06/09/2012  . Cellulitis 06/09/2012  . Weakness of both legs 06/09/2012  . PMR (polymyalgia rheumatica) (HCC)   . Restless leg syndrome   . Angina pectoris, unstable ([place]) 03/02/2012  . Acute blood loss as cause of postoperative anemia 03/02/2012  . Dyspnea 02/03/2012  . UTI (urinary tract infection) 02/03/2012  . CAD (coronary artery disease)   . HTN (hypertension)   . VTE (venous thromboembolism) 10/01/2010  . HYPERCHOLESTEROLEMIA  IIA 08/27/2009  . Hyperlipidemia 12/11/2008  . HYPERTENSION, BENIGN 12/11/2008    CMP     Component Value Date/Time   NA 142 12/27/2018   K 3.6 12/27/2018   CL 105 12/17/2018 0417   CO2 22 12/17/2018 0417   GLUCOSE 104 (H) 12/17/2018 0417   BUN 32 (A) 12/27/2018   CREATININE 0.9 12/27/2018   CREATININE 0.99 12/17/2018 0417   CALCIUM 7.7 (L) 12/17/2018 0417   PROT 6.7 03/11/2017 2031   ALBUMIN 3.4 (L) 03/11/2017 2031   AST 9 (A) 08/01/2018   ALT 7 08/01/2018   ALKPHOS 58 08/01/2018   BILITOT 0.5 03/11/2017 2031   GFRNONAA  48 (L) 12/17/2018 0417   GFRAA 56 (L) 12/17/2018 0417   Recent Labs    12/14/18 0536 12/15/18 0911 12/16/18 0611 12/17/18 0417 12/27/18  NA 138 138 136 135 142  K 3.9 3.4* 4.0 4.1 3.6  CL 110 108 108 105  --   CO2 22 23 22 22   --   GLUCOSE 144* 85 96 104*  --   BUN 25* 25* 21 20 32*  CREATININE 1.13* 0.81 0.86 0.99 0.9  CALCIUM 8.3*  7.8* 7.8* 7.7*  --   MG 2.1 2.3 2.2  --   --    Recent Labs    08/01/18  AST 9*  ALT 7  ALKPHOS 58   Recent Labs    12/11/18 2308  12/16/18 0611 12/17/18 0417 12/18/18 0408 12/27/18  WBC 14.6*   < > 7.4 7.3 8.2 12.2  NEUTROABS 12.6*  --   --  4.7 5.3  --   HGB 11.0*   < > 7.3* 9.4* 9.5* 9.0*  HCT 36.4   < > 24.5* 31.3* 31.4* 28*  MCV 83.7   < > 83.3 83.2 83.3  --   PLT 311   < > 282 265 257 404*   < > = values in this interval not displayed.   Recent Labs    08/01/18  CHOL 140  LDLCALC 80  TRIG 82   No results found for: [facility] Lab Results  Component Value Date   TSH 1.71 08/01/2018   Lab Results  Component Value Date   HGBA1C 5.7 08/01/2018   Lab Results  Component Value Date   CHOL 140 08/01/2018   HDL 46 08/01/2018   LDLCALC 80 08/01/2018   TRIG 82 08/01/2018   CHOLHDL 2.4 03/03/2012    Significant Diagnostic Results in last 30 days:  No results found.  Assessment and Plan  Dementia Chronic and stable; continue Aricept 10 mg daily  Hyperlipidemia Controlled; continue Lipitor 10 mg daily  PMR (polymyalgia rheumatica) (HCC) No complaints; well controlled on 1 mg daily prednisone    Jene Huq D. [name] Coil, MD

## 2019-01-23 ENCOUNTER — Other Ambulatory Visit: Payer: Self-pay | Admitting: Adult Health

## 2019-01-23 MED ORDER — TRAMADOL HCL 50 MG PO TABS: 50.0000 mg | ORAL_TABLET | Freq: Three times a day (TID) | ORAL | 0 refills | Status: DC

## 2019-01-24 ENCOUNTER — Other Ambulatory Visit: Payer: Self-pay | Admitting: Adult Health

## 2019-01-24 MED ORDER — ALPRAZOLAM 0.5 MG PO TABS: 0.5000 mg | ORAL_TABLET | Freq: Every day | ORAL | 0 refills | Status: DC

## 2019-01-26 ENCOUNTER — Encounter: Payer: Self-pay | Admitting: Internal Medicine

## 2019-01-26 NOTE — Assessment & Plan Note (Signed)
Controlled; continue Lipitor 10 mg daily

## 2019-01-26 NOTE — Assessment & Plan Note (Signed)
Chronic and stable; continue Aricept 10 mg daily 

## 2019-01-26 NOTE — Assessment & Plan Note (Addendum)
No complaints; well controlled on 1 mg daily prednisone

## 2019-02-15 DIAGNOSIS — R2681 Unsteadiness on feet: Secondary | ICD-10-CM | POA: Diagnosis not present

## 2019-02-15 DIAGNOSIS — M6281 Muscle weakness (generalized): Secondary | ICD-10-CM | POA: Diagnosis not present

## 2019-02-15 DIAGNOSIS — Z9181 History of falling: Secondary | ICD-10-CM | POA: Diagnosis not present

## 2019-02-15 DIAGNOSIS — N183 Chronic kidney disease, stage 3 (moderate): Secondary | ICD-10-CM | POA: Diagnosis not present

## 2019-02-15 DIAGNOSIS — S72142D Displaced intertrochanteric fracture of left femur, subsequent encounter for closed fracture with routine healing: Secondary | ICD-10-CM | POA: Diagnosis not present

## 2019-02-16 DIAGNOSIS — M6281 Muscle weakness (generalized): Secondary | ICD-10-CM | POA: Diagnosis not present

## 2019-02-16 DIAGNOSIS — Z9181 History of falling: Secondary | ICD-10-CM | POA: Diagnosis not present

## 2019-02-16 DIAGNOSIS — S72142D Displaced intertrochanteric fracture of left femur, subsequent encounter for closed fracture with routine healing: Secondary | ICD-10-CM | POA: Diagnosis not present

## 2019-02-16 DIAGNOSIS — R2681 Unsteadiness on feet: Secondary | ICD-10-CM | POA: Diagnosis not present

## 2019-02-16 DIAGNOSIS — N183 Chronic kidney disease, stage 3 (moderate): Secondary | ICD-10-CM | POA: Diagnosis not present

## 2019-02-19 ENCOUNTER — Non-Acute Institutional Stay (SKILLED_NURSING_FACILITY): Payer: Medicare Other | Admitting: Internal Medicine

## 2019-02-19 ENCOUNTER — Encounter: Payer: Self-pay | Admitting: Internal Medicine

## 2019-02-19 DIAGNOSIS — I10 Essential (primary) hypertension: Secondary | ICD-10-CM

## 2019-02-19 DIAGNOSIS — R2681 Unsteadiness on feet: Secondary | ICD-10-CM | POA: Diagnosis not present

## 2019-02-19 DIAGNOSIS — S72142D Displaced intertrochanteric fracture of left femur, subsequent encounter for closed fracture with routine healing: Secondary | ICD-10-CM | POA: Diagnosis not present

## 2019-02-19 DIAGNOSIS — N183 Chronic kidney disease, stage 3 (moderate): Secondary | ICD-10-CM | POA: Diagnosis not present

## 2019-02-19 DIAGNOSIS — I8290 Acute embolism and thrombosis of unspecified vein: Secondary | ICD-10-CM

## 2019-02-19 DIAGNOSIS — I2510 Atherosclerotic heart disease of native coronary artery without angina pectoris: Secondary | ICD-10-CM | POA: Diagnosis not present

## 2019-02-19 DIAGNOSIS — M6281 Muscle weakness (generalized): Secondary | ICD-10-CM | POA: Diagnosis not present

## 2019-02-19 DIAGNOSIS — Z9181 History of falling: Secondary | ICD-10-CM | POA: Diagnosis not present

## 2019-02-19 NOTE — Progress Notes (Signed)
Location:  [facility] Room Number: [phone] Place of Service:  SNF [phone])  [name], MD  Patient Care Team: Hennie Duos, MD as PCP - General (Internal Medicine)  Extended Emergency Contact Information Primary Emergency Contact: Spearman,[name] Address: [address]          [place], [place] [address] [name] of [place] Phone: [phone] Mobile Phone: [phone] Relation: Son Secondary Emergency Contact: [place] of [place] Phone: [phone] Relation: Daughter    Allergies: Ambien [zolpidem tartrate]; Codeine phosphate; Darvocet [propoxyphene n-acetaminophen]; Fish-derived products; Promethazine hcl; Sulfamethoxazole; Vicodin [hydrocodone-acetaminophen]; Amoxicillin; and Penicillins  Chief Complaint  Patient presents with   Medical Management of Chronic Issues    Routine visit    HPI: Patient is 85 y.o. female who is being seen for routine issues of hypertension, CAD, and DVT/PE.  Past Medical History:  Diagnosis Date   Anemia 03/02/2012   Angina pectoris, unstable ([place]) 03/02/2012   Anxiety    Axillary abscess    Bradycardia 06/13/2012   CAD (coronary artery disease)    S/p PTCA / stenting (last cath 2004, multiple LAD stents, 2 stents in the right coronary artery all patent)   Cancer ([place])    Complication of anesthesia    Compression fracture of L2 ([place]) 03/12/2017   Dementia ([place])    Dysphagia 10/06/2012   Dyspnea 02/03/2012   GERD (gastroesophageal reflux disease)    History of pulmonary embolus (PE) 03/19/2017   HTN (hypertension)    PMR (polymyalgia rheumatica) (HCC)    PONV (postoperative nausea and vomiting)    Restless leg syndrome    VTE (venous thromboembolism) 10/2010   DVT and PE. Started coumadin   Weakness of both legs 06/09/2012    Past Surgical History:  Procedure Laterality Date   BREAST LUMPECTOMY     COLECTOMY      ESOPHAGOGASTRODUODENOSCOPY (EGD) WITH ESOPHAGEAL DILATION  10/10/2012   Procedure: ESOPHAGOGASTRODUODENOSCOPY (EGD) WITH ESOPHAGEAL DILATION;  Surgeon: Inda Castle, MD;  Location: [facility];  Service: Endoscopy;  Laterality: N/A;   FEMUR IM NAIL Left 12/13/2018   Procedure: INTRAMEDULLARY (IM) NAIL FEMORAL LEFT;  Surgeon: Paralee Cancel, MD;  [facility];  Service: Orthopedics;  Laterality: Left;   heart stents  x 8   INCISION AND DRAINAGE     bilateral axillary, non specific staff   INCISION AND DRAINAGE ABSCESS  09/28/2012   Procedure: INCISION AND DRAINAGE ABSCESS;  Surgeon: Zenovia Jarred, MD;  Location: [facility];  Service: General;  Laterality: Bilateral;   stent     cardiac x 8 stents.    Allergies as of 02/19/2019      Reactions   Ambien [zolpidem Tartrate] Nausea And Vomiting   Codeine Phosphate Nausea And Vomiting   Darvocet [propoxyphene N-acetaminophen] Nausea And Vomiting   Fish-derived Products    Promethazine Hcl Nausea And Vomiting   Sulfamethoxazole Other (See Comments)   Pt doesn't remember reaction   Vicodin [hydrocodone-acetaminophen] Other (See Comments)   Unknown reaction   Amoxicillin Nausea And Vomiting, Rash   Penicillins Nausea And Vomiting, Rash      Medication List       Accurate as of February 19, 2019 11:59 PM. Always use your most recent med list.        acetaminophen 325 MG tablet Commonly known as:  TYLENOL Take 1-2 tablets (325-650 mg total) by mouth every 6 (six) hours as needed for mild pain (pain  score 1-3 or temp > 100.5).   ALPRAZolam 0.5 MG tablet Commonly known as:  XANAX Take 1 tablet (0.5 mg total) by mouth daily.   amLODipine 10 MG tablet Commonly known as:  NORVASC Take 10 mg by mouth every morning.   aspirin EC 81 MG tablet Take 1 tablet (81 mg total) by mouth daily.   atorvastatin 10 MG tablet Commonly known as:  LIPITOR Take 10 mg by mouth every evening.   cholecalciferol 1000 units tablet Commonly known  as:  VITAMIN D Take 2,000 Units by mouth daily with lunch. 2 tabs   donepezil 10 MG tablet Commonly known as:  ARICEPT Take 10 mg by mouth every evening.   ENEMA RE Place 1 enema rectally daily as needed (constipation).   losartan 50 MG tablet Commonly known as:  COZAAR Take 50 mg by mouth at bedtime.   magnesium hydroxide 400 MG/5ML suspension Commonly known as:  MILK OF MAGNESIA Take 30 mLs by mouth daily as needed for mild constipation.   mirtazapine 15 MG tablet Commonly known as:  REMERON Take 0.5 tablets (7.5 mg total) by mouth at bedtime.   nitroGLYCERIN 0.4 MG SL tablet Commonly known as:  NITROSTAT Place 0.4 mg under the tongue every 5 (five) minutes as needed for chest pain.   ondansetron 4 MG tablet Commonly known as:  ZOFRAN Take 4 mg by mouth every 4 (four) hours as needed for nausea.   oxybutynin 5 MG tablet Commonly known as:  DITROPAN Take 5 mg by mouth 2 (two) times daily.   polyethylene glycol 17 g packet Commonly known as:  MIRALAX / GLYCOLAX Take 17 g by mouth daily as needed for moderate constipation.   predniSONE 1 MG tablet Commonly known as:  DELTASONE Take 1 mg by mouth daily with breakfast.   ProAir HFA 108 (90 Base) MCG/ACT inhaler Generic drug:  albuterol Inhale 2 puffs into the lungs every 6 (six) hours as needed for wheezing or shortness of breath.   rOPINIRole 1 MG tablet Commonly known as:  REQUIP Take 1 mg by mouth at bedtime. 1mg  at noon, 1mg  2 hours before bed, then 1mg  at bedtime   senna-docusate 8.6-50 MG tablet Commonly known as:  Senokot-S Take 1 tablet by mouth 2 (two) times daily.   Silenor 6 MG Tabs Generic drug:  Doxepin HCl Take 0.5 tablets (3 mg total) by mouth at bedtime.   traMADol 50 MG tablet Commonly known as:  ULTRAM Take 1 tablet (50 mg total) by mouth 3 (three) times daily.   Tums 500 MG chewable tablet Generic drug:  calcium carbonate Chew 1 tablet by mouth 3 (three) times daily as needed for  indigestion or heartburn.   warfarin 2.5 MG tablet Commonly known as:  COUMADIN Take 2.5 mg by mouth daily.       No orders of the defined types were placed in this encounter.   Immunization History  Administered Date(s) Administered   Influenza-Unspecified 08/01/2016, 08/11/2017   PPD Test 03/18/2017   Pneumococcal Conjugate-13 09/17/2017    Social History   Tobacco Use   Smoking status: Never Smoker   Smokeless tobacco: Never Used  Substance Use Topics   Alcohol use: No    Review of Systems  DATA OBTAINED: from patient, nurse GENERAL:  no fevers, fatigue, appetite changes SKIN: No itching, rash HEENT: No complaint RESPIRATORY: No cough, wheezing, SOB CARDIAC: No chest pain, palpitations, lower extremity edema  GI: No abdominal pain, No N/V/D or constipation, No heartburn  or reflux  GU: No dysuria, frequency or urgency, or incontinence  MUSCULOSKELETAL: No unrelieved bone/joint pain NEUROLOGIC: No headache, dizziness  PSYCHIATRIC: No overt anxiety or sadness  Vitals:   02/19/19 1447  BP: 125/75  Pulse: 74  Resp: 18  Temp: 98 F (36.7 C)   Body mass index is 23.35 kg/m. Physical Exam  GENERAL APPEARANCE: Alert, conversant, No acute distress  SKIN: No diaphoresis rash HEENT: Unremarkable RESPIRATORY: Breathing is even, unlabored. Lung sounds are clear   CARDIOVASCULAR: Heart RRR 3/6 systolic murmur, norubs or gallops. No peripheral edema  GASTROINTESTINAL: Abdomen is soft, non-tender, not distended w/ normal bowel sounds.  GENITOURINARY: Bladder non tender, not distended  MUSCULOSKELETAL: No abnormal joints or musculature NEUROLOGIC: Cranial nerves 2-12 grossly intact. Moves all extremities PSYCHIATRIC: Mood and affect appropriate to situation, no behavioral issues  Patient Active Problem List   Diagnosis Date Noted   Failure to thrive in adult 01/08/2019   Moderate protein malnutrition ([place]) 01/08/2019   Pressure injury of skin 12/18/2018     Intertrochanteric fracture of left femur ([place]) 12/11/2018   CKD (chronic kidney disease), stage III ([place]) 12/11/2018   Depression with anxiety 12/11/2018   Overactive bladder 09/24/2017   Insomnia 07/31/2017   Anxiety 06/16/2017   Citrobacter infection 03/19/2017   AKI (acute kidney injury) ([place]) 03/19/2017   History of pulmonary embolus (PE) 03/19/2017   Elevated INR 03/19/2017   Candidal intertrigo 03/19/2017   Compression fracture of L2 ([place]) 03/12/2017   Acute lower UTI 03/12/2017   Accelerated hypertension 03/12/2017   Leukocytosis 07/31/2015   Acute bronchitis 07/28/2015   Chronic anticoagulation 07/28/2015   Intractable back pain 07/20/2015   Back pain 07/20/2015   Cellulitis and abscess of left buttock 04/12/2013   Stricture and stenosis of esophagus 10/10/2012   Dementia ([place]) 10/06/2012   Dysphagia 10/06/2012   Axillary abscess - bilateral, multiple 09/27/2012   Hypotension 08/24/2012   Near syncope 08/24/2012   Abscess of axilla, left 06/27/2012   Bradycardia 06/13/2012   Abscess 06/11/2012   Chest pain 06/09/2012   Cellulitis 06/09/2012   Weakness of both legs 06/09/2012   PMR (polymyalgia rheumatica) (HCC)    Restless leg syndrome    Angina pectoris, unstable ([place]) 03/02/2012   Acute blood loss as cause of postoperative anemia 03/02/2012   Dyspnea 02/03/2012   UTI (urinary tract infection) 02/03/2012   CAD (coronary artery disease)    HTN (hypertension)    VTE (venous thromboembolism) 10/01/2010   HYPERCHOLESTEROLEMIA  IIA 08/27/2009   Hyperlipidemia 12/11/2008   HYPERTENSION, BENIGN 12/11/2008    CMP     Component Value Date/Time   NA 142 12/27/2018   K 3.6 12/27/2018   CL 105 12/17/2018 0417   CO2 22 12/17/2018 0417   GLUCOSE 104 (H) 12/17/2018 0417   BUN 32 (A) 12/27/2018   CREATININE 0.9 12/27/2018   CREATININE 0.99 12/17/2018 0417   CALCIUM 7.7 (L) 12/17/2018 0417   PROT 6.7 03/11/2017 2031    ALBUMIN 3.4 (L) 03/11/2017 2031   AST 9 (A) 08/01/2018   ALT 7 08/01/2018   ALKPHOS 58 08/01/2018   BILITOT 0.5 03/11/2017 2031   GFRNONAA 48 (L) 12/17/2018 0417   GFRAA 56 (L) 12/17/2018 0417   Recent Labs    12/14/18 0536 12/15/18 0911 12/16/18 0611 12/17/18 0417 12/27/18  NA 138 138 136 135 142  K 3.9 3.4* 4.0 4.1 3.6  CL 110 108 108 105  --   CO2 22 23 22 22   --  GLUCOSE 144* 85 96 104*  --   BUN 25* 25* 21 20 32*  CREATININE 1.13* 0.81 0.86 0.99 0.9  CALCIUM 8.3* 7.8* 7.8* 7.7*  --   MG 2.1 2.3 2.2  --   --    Recent Labs    08/01/18  AST 9*  ALT 7  ALKPHOS 58   Recent Labs    12/11/18 2308  12/16/18 0611 12/17/18 0417 12/18/18 0408 12/27/18  WBC 14.6*   < > 7.4 7.3 8.2 12.2  NEUTROABS 12.6*  --   --  4.7 5.3  --   HGB 11.0*   < > 7.3* 9.4* 9.5* 9.0*  HCT 36.4   < > 24.5* 31.3* 31.4* 28*  MCV 83.7   < > 83.3 83.2 83.3  --   PLT 311   < > 282 265 257 404*   < > = values in this interval not displayed.   Recent Labs    08/01/18  CHOL 140  LDLCALC 80  TRIG 82   No results found for: [facility] Lab Results  Component Value Date   TSH 1.71 08/01/2018   Lab Results  Component Value Date   HGBA1C 5.7 08/01/2018   Lab Results  Component Value Date   CHOL 140 08/01/2018   HDL 46 08/01/2018   LDLCALC 80 08/01/2018   TRIG 82 08/01/2018   CHOLHDL 2.4 03/03/2012    Significant Diagnostic Results in last 30 days:  No results found.  Assessment and Plan  HYPERTENSION, BENIGN Chronic and stable on multiple medications; continue losartan 50 mg daily, hydralazine 25 mg every 8 and Norvasc 10 mg daily  CAD (coronary artery disease) No reported chest pain; continue ASA 81 mg daily, with sublingual nitroglycerin as needed; patient is on statin  VTE (venous thromboembolism) Chronic and stable; continue Coumadin which is actively titrated     Webb Silversmith D. [name], MD

## 2019-02-20 DIAGNOSIS — R2681 Unsteadiness on feet: Secondary | ICD-10-CM | POA: Diagnosis not present

## 2019-02-20 DIAGNOSIS — Z9181 History of falling: Secondary | ICD-10-CM | POA: Diagnosis not present

## 2019-02-20 DIAGNOSIS — N183 Chronic kidney disease, stage 3 (moderate): Secondary | ICD-10-CM | POA: Diagnosis not present

## 2019-02-20 DIAGNOSIS — M6281 Muscle weakness (generalized): Secondary | ICD-10-CM | POA: Diagnosis not present

## 2019-02-20 DIAGNOSIS — S72142D Displaced intertrochanteric fracture of left femur, subsequent encounter for closed fracture with routine healing: Secondary | ICD-10-CM | POA: Diagnosis not present

## 2019-02-21 ENCOUNTER — Encounter: Payer: Self-pay | Admitting: Internal Medicine

## 2019-02-21 DIAGNOSIS — M6281 Muscle weakness (generalized): Secondary | ICD-10-CM | POA: Diagnosis not present

## 2019-02-21 DIAGNOSIS — N183 Chronic kidney disease, stage 3 (moderate): Secondary | ICD-10-CM | POA: Diagnosis not present

## 2019-02-21 DIAGNOSIS — Z9181 History of falling: Secondary | ICD-10-CM | POA: Diagnosis not present

## 2019-02-21 DIAGNOSIS — R2681 Unsteadiness on feet: Secondary | ICD-10-CM | POA: Diagnosis not present

## 2019-02-21 DIAGNOSIS — S72142D Displaced intertrochanteric fracture of left femur, subsequent encounter for closed fracture with routine healing: Secondary | ICD-10-CM | POA: Diagnosis not present

## 2019-02-21 NOTE — Assessment & Plan Note (Signed)
No reported chest pain; continue ASA 81 mg daily, with sublingual nitroglycerin as needed; patient is on statin

## 2019-02-21 NOTE — Assessment & Plan Note (Signed)
Chronic and stable; continue Coumadin which is actively titrated

## 2019-02-21 NOTE — Assessment & Plan Note (Signed)
Chronic and stable on multiple medications; continue losartan 50 mg daily, hydralazine 25 mg every 8 and Norvasc 10 mg daily

## 2019-02-22 DIAGNOSIS — Z9181 History of falling: Secondary | ICD-10-CM | POA: Diagnosis not present

## 2019-02-22 DIAGNOSIS — N183 Chronic kidney disease, stage 3 (moderate): Secondary | ICD-10-CM | POA: Diagnosis not present

## 2019-02-22 DIAGNOSIS — R2681 Unsteadiness on feet: Secondary | ICD-10-CM | POA: Diagnosis not present

## 2019-02-22 DIAGNOSIS — S72142D Displaced intertrochanteric fracture of left femur, subsequent encounter for closed fracture with routine healing: Secondary | ICD-10-CM | POA: Diagnosis not present

## 2019-02-22 DIAGNOSIS — M6281 Muscle weakness (generalized): Secondary | ICD-10-CM | POA: Diagnosis not present

## 2019-02-23 ENCOUNTER — Other Ambulatory Visit: Payer: Self-pay | Admitting: Internal Medicine

## 2019-02-23 DIAGNOSIS — R2681 Unsteadiness on feet: Secondary | ICD-10-CM | POA: Diagnosis not present

## 2019-02-23 DIAGNOSIS — S72142D Displaced intertrochanteric fracture of left femur, subsequent encounter for closed fracture with routine healing: Secondary | ICD-10-CM | POA: Diagnosis not present

## 2019-02-23 DIAGNOSIS — Z9181 History of falling: Secondary | ICD-10-CM | POA: Diagnosis not present

## 2019-02-23 DIAGNOSIS — M6281 Muscle weakness (generalized): Secondary | ICD-10-CM | POA: Diagnosis not present

## 2019-02-23 DIAGNOSIS — N183 Chronic kidney disease, stage 3 (moderate): Secondary | ICD-10-CM | POA: Diagnosis not present

## 2019-02-23 MED ORDER — ALPRAZOLAM 0.5 MG PO TABS: 0.5000 mg | ORAL_TABLET | Freq: Every day | ORAL | 0 refills | Status: DC

## 2019-02-26 DIAGNOSIS — Z9181 History of falling: Secondary | ICD-10-CM | POA: Diagnosis not present

## 2019-02-26 DIAGNOSIS — R2681 Unsteadiness on feet: Secondary | ICD-10-CM | POA: Diagnosis not present

## 2019-02-26 DIAGNOSIS — N183 Chronic kidney disease, stage 3 (moderate): Secondary | ICD-10-CM | POA: Diagnosis not present

## 2019-02-26 DIAGNOSIS — M6281 Muscle weakness (generalized): Secondary | ICD-10-CM | POA: Diagnosis not present

## 2019-02-26 DIAGNOSIS — S72142D Displaced intertrochanteric fracture of left femur, subsequent encounter for closed fracture with routine healing: Secondary | ICD-10-CM | POA: Diagnosis not present

## 2019-02-27 DIAGNOSIS — S72142D Displaced intertrochanteric fracture of left femur, subsequent encounter for closed fracture with routine healing: Secondary | ICD-10-CM | POA: Diagnosis not present

## 2019-02-27 DIAGNOSIS — M6281 Muscle weakness (generalized): Secondary | ICD-10-CM | POA: Diagnosis not present

## 2019-02-27 DIAGNOSIS — Z9181 History of falling: Secondary | ICD-10-CM | POA: Diagnosis not present

## 2019-02-27 DIAGNOSIS — R2681 Unsteadiness on feet: Secondary | ICD-10-CM | POA: Diagnosis not present

## 2019-02-27 DIAGNOSIS — N183 Chronic kidney disease, stage 3 (moderate): Secondary | ICD-10-CM | POA: Diagnosis not present

## 2019-02-28 DIAGNOSIS — M6281 Muscle weakness (generalized): Secondary | ICD-10-CM | POA: Diagnosis not present

## 2019-02-28 DIAGNOSIS — R2681 Unsteadiness on feet: Secondary | ICD-10-CM | POA: Diagnosis not present

## 2019-02-28 DIAGNOSIS — N183 Chronic kidney disease, stage 3 (moderate): Secondary | ICD-10-CM | POA: Diagnosis not present

## 2019-02-28 DIAGNOSIS — S72142D Displaced intertrochanteric fracture of left femur, subsequent encounter for closed fracture with routine healing: Secondary | ICD-10-CM | POA: Diagnosis not present

## 2019-02-28 DIAGNOSIS — Z9181 History of falling: Secondary | ICD-10-CM | POA: Diagnosis not present

## 2019-03-01 DIAGNOSIS — N183 Chronic kidney disease, stage 3 (moderate): Secondary | ICD-10-CM | POA: Diagnosis not present

## 2019-03-01 DIAGNOSIS — S72142D Displaced intertrochanteric fracture of left femur, subsequent encounter for closed fracture with routine healing: Secondary | ICD-10-CM | POA: Diagnosis not present

## 2019-03-01 DIAGNOSIS — R2681 Unsteadiness on feet: Secondary | ICD-10-CM | POA: Diagnosis not present

## 2019-03-01 DIAGNOSIS — M6281 Muscle weakness (generalized): Secondary | ICD-10-CM | POA: Diagnosis not present

## 2019-03-01 DIAGNOSIS — Z9181 History of falling: Secondary | ICD-10-CM | POA: Diagnosis not present

## 2019-03-02 DIAGNOSIS — M6281 Muscle weakness (generalized): Secondary | ICD-10-CM | POA: Diagnosis not present

## 2019-03-02 DIAGNOSIS — S72142D Displaced intertrochanteric fracture of left femur, subsequent encounter for closed fracture with routine healing: Secondary | ICD-10-CM | POA: Diagnosis not present

## 2019-03-02 DIAGNOSIS — Z9181 History of falling: Secondary | ICD-10-CM | POA: Diagnosis not present

## 2019-03-02 DIAGNOSIS — R2681 Unsteadiness on feet: Secondary | ICD-10-CM | POA: Diagnosis not present

## 2019-03-02 DIAGNOSIS — N183 Chronic kidney disease, stage 3 (moderate): Secondary | ICD-10-CM | POA: Diagnosis not present

## 2019-03-05 DIAGNOSIS — M6281 Muscle weakness (generalized): Secondary | ICD-10-CM | POA: Diagnosis not present

## 2019-03-05 DIAGNOSIS — R2681 Unsteadiness on feet: Secondary | ICD-10-CM | POA: Diagnosis not present

## 2019-03-05 DIAGNOSIS — N183 Chronic kidney disease, stage 3 (moderate): Secondary | ICD-10-CM | POA: Diagnosis not present

## 2019-03-05 DIAGNOSIS — Z9181 History of falling: Secondary | ICD-10-CM | POA: Diagnosis not present

## 2019-03-05 DIAGNOSIS — S72142D Displaced intertrochanteric fracture of left femur, subsequent encounter for closed fracture with routine healing: Secondary | ICD-10-CM | POA: Diagnosis not present

## 2019-03-06 DIAGNOSIS — S72142D Displaced intertrochanteric fracture of left femur, subsequent encounter for closed fracture with routine healing: Secondary | ICD-10-CM | POA: Diagnosis not present

## 2019-03-06 DIAGNOSIS — M6281 Muscle weakness (generalized): Secondary | ICD-10-CM | POA: Diagnosis not present

## 2019-03-06 DIAGNOSIS — Z9181 History of falling: Secondary | ICD-10-CM | POA: Diagnosis not present

## 2019-03-06 DIAGNOSIS — N183 Chronic kidney disease, stage 3 (moderate): Secondary | ICD-10-CM | POA: Diagnosis not present

## 2019-03-06 DIAGNOSIS — R2681 Unsteadiness on feet: Secondary | ICD-10-CM | POA: Diagnosis not present

## 2019-03-07 DIAGNOSIS — R2681 Unsteadiness on feet: Secondary | ICD-10-CM | POA: Diagnosis not present

## 2019-03-07 DIAGNOSIS — N183 Chronic kidney disease, stage 3 (moderate): Secondary | ICD-10-CM | POA: Diagnosis not present

## 2019-03-07 DIAGNOSIS — M6281 Muscle weakness (generalized): Secondary | ICD-10-CM | POA: Diagnosis not present

## 2019-03-07 DIAGNOSIS — S72142D Displaced intertrochanteric fracture of left femur, subsequent encounter for closed fracture with routine healing: Secondary | ICD-10-CM | POA: Diagnosis not present

## 2019-03-07 DIAGNOSIS — Z9181 History of falling: Secondary | ICD-10-CM | POA: Diagnosis not present

## 2019-03-08 ENCOUNTER — Other Ambulatory Visit: Payer: Self-pay | Admitting: Internal Medicine

## 2019-03-08 DIAGNOSIS — M6281 Muscle weakness (generalized): Secondary | ICD-10-CM | POA: Diagnosis not present

## 2019-03-08 DIAGNOSIS — N183 Chronic kidney disease, stage 3 (moderate): Secondary | ICD-10-CM | POA: Diagnosis not present

## 2019-03-08 DIAGNOSIS — S72142D Displaced intertrochanteric fracture of left femur, subsequent encounter for closed fracture with routine healing: Secondary | ICD-10-CM | POA: Diagnosis not present

## 2019-03-08 DIAGNOSIS — R2681 Unsteadiness on feet: Secondary | ICD-10-CM | POA: Diagnosis not present

## 2019-03-08 DIAGNOSIS — Z9181 History of falling: Secondary | ICD-10-CM | POA: Diagnosis not present

## 2019-03-08 MED ORDER — TRAMADOL HCL 50 MG PO TABS: 50.0000 mg | ORAL_TABLET | Freq: Two times a day (BID) | ORAL | 0 refills | Status: DC

## 2019-03-09 DIAGNOSIS — R2681 Unsteadiness on feet: Secondary | ICD-10-CM | POA: Diagnosis not present

## 2019-03-09 DIAGNOSIS — S72142D Displaced intertrochanteric fracture of left femur, subsequent encounter for closed fracture with routine healing: Secondary | ICD-10-CM | POA: Diagnosis not present

## 2019-03-09 DIAGNOSIS — N183 Chronic kidney disease, stage 3 (moderate): Secondary | ICD-10-CM | POA: Diagnosis not present

## 2019-03-09 DIAGNOSIS — Z9181 History of falling: Secondary | ICD-10-CM | POA: Diagnosis not present

## 2019-03-09 DIAGNOSIS — M6281 Muscle weakness (generalized): Secondary | ICD-10-CM | POA: Diagnosis not present

## 2019-03-11 DIAGNOSIS — N183 Chronic kidney disease, stage 3 (moderate): Secondary | ICD-10-CM | POA: Diagnosis not present

## 2019-03-11 DIAGNOSIS — M6281 Muscle weakness (generalized): Secondary | ICD-10-CM | POA: Diagnosis not present

## 2019-03-11 DIAGNOSIS — S72142D Displaced intertrochanteric fracture of left femur, subsequent encounter for closed fracture with routine healing: Secondary | ICD-10-CM | POA: Diagnosis not present

## 2019-03-11 DIAGNOSIS — R2681 Unsteadiness on feet: Secondary | ICD-10-CM | POA: Diagnosis not present

## 2019-03-11 DIAGNOSIS — Z9181 History of falling: Secondary | ICD-10-CM | POA: Diagnosis not present

## 2019-03-12 DIAGNOSIS — S72142D Displaced intertrochanteric fracture of left femur, subsequent encounter for closed fracture with routine healing: Secondary | ICD-10-CM | POA: Diagnosis not present

## 2019-03-12 DIAGNOSIS — N183 Chronic kidney disease, stage 3 (moderate): Secondary | ICD-10-CM | POA: Diagnosis not present

## 2019-03-12 DIAGNOSIS — M6281 Muscle weakness (generalized): Secondary | ICD-10-CM | POA: Diagnosis not present

## 2019-03-12 DIAGNOSIS — R2681 Unsteadiness on feet: Secondary | ICD-10-CM | POA: Diagnosis not present

## 2019-03-12 DIAGNOSIS — Z9181 History of falling: Secondary | ICD-10-CM | POA: Diagnosis not present

## 2019-03-13 DIAGNOSIS — S72142D Displaced intertrochanteric fracture of left femur, subsequent encounter for closed fracture with routine healing: Secondary | ICD-10-CM | POA: Diagnosis not present

## 2019-03-13 DIAGNOSIS — M6281 Muscle weakness (generalized): Secondary | ICD-10-CM | POA: Diagnosis not present

## 2019-03-13 DIAGNOSIS — R2681 Unsteadiness on feet: Secondary | ICD-10-CM | POA: Diagnosis not present

## 2019-03-13 DIAGNOSIS — N183 Chronic kidney disease, stage 3 (moderate): Secondary | ICD-10-CM | POA: Diagnosis not present

## 2019-03-13 DIAGNOSIS — Z9181 History of falling: Secondary | ICD-10-CM | POA: Diagnosis not present

## 2019-03-14 DIAGNOSIS — F0390 Unspecified dementia without behavioral disturbance: Secondary | ICD-10-CM | POA: Diagnosis not present

## 2019-03-14 DIAGNOSIS — R2681 Unsteadiness on feet: Secondary | ICD-10-CM | POA: Diagnosis not present

## 2019-03-14 DIAGNOSIS — N183 Chronic kidney disease, stage 3 (moderate): Secondary | ICD-10-CM | POA: Diagnosis not present

## 2019-03-14 DIAGNOSIS — S72142D Displaced intertrochanteric fracture of left femur, subsequent encounter for closed fracture with routine healing: Secondary | ICD-10-CM | POA: Diagnosis not present

## 2019-03-14 DIAGNOSIS — M6281 Muscle weakness (generalized): Secondary | ICD-10-CM | POA: Diagnosis not present

## 2019-03-14 DIAGNOSIS — Z9181 History of falling: Secondary | ICD-10-CM | POA: Diagnosis not present

## 2019-03-14 DIAGNOSIS — G4700 Insomnia, unspecified: Secondary | ICD-10-CM | POA: Diagnosis not present

## 2019-03-14 DIAGNOSIS — F329 Major depressive disorder, single episode, unspecified: Secondary | ICD-10-CM | POA: Diagnosis not present

## 2019-03-14 DIAGNOSIS — F419 Anxiety disorder, unspecified: Secondary | ICD-10-CM | POA: Diagnosis not present

## 2019-03-15 ENCOUNTER — Encounter: Payer: Self-pay | Admitting: Internal Medicine

## 2019-03-15 ENCOUNTER — Non-Acute Institutional Stay (SKILLED_NURSING_FACILITY): Payer: Medicare Other | Admitting: Internal Medicine

## 2019-03-15 DIAGNOSIS — N183 Chronic kidney disease, stage 3 (moderate): Secondary | ICD-10-CM | POA: Diagnosis not present

## 2019-03-15 DIAGNOSIS — M353 Polymyalgia rheumatica: Secondary | ICD-10-CM | POA: Diagnosis not present

## 2019-03-15 DIAGNOSIS — N3281 Overactive bladder: Secondary | ICD-10-CM | POA: Diagnosis not present

## 2019-03-15 DIAGNOSIS — Z9181 History of falling: Secondary | ICD-10-CM | POA: Diagnosis not present

## 2019-03-15 DIAGNOSIS — R2681 Unsteadiness on feet: Secondary | ICD-10-CM | POA: Diagnosis not present

## 2019-03-15 DIAGNOSIS — S72142D Displaced intertrochanteric fracture of left femur, subsequent encounter for closed fracture with routine healing: Secondary | ICD-10-CM | POA: Diagnosis not present

## 2019-03-15 DIAGNOSIS — G2581 Restless legs syndrome: Secondary | ICD-10-CM | POA: Diagnosis not present

## 2019-03-15 DIAGNOSIS — M6281 Muscle weakness (generalized): Secondary | ICD-10-CM | POA: Diagnosis not present

## 2019-03-15 NOTE — Progress Notes (Signed)
Location:  [facility] Room Number: 204-D Place of Service:  SNF (31)  Hennie Duos, MD  Patient Care Team: Hennie Duos, MD as PCP - General (Internal Medicine)  Extended Emergency Contact Information Primary Emergency Contact: Hipwell,[name] Address: [address]          [place], [place] [address] [name] of [place] Phone: [phone] Mobile Phone: [phone] Relation: Son Secondary Emergency Contact: [place] of [place] Phone: [phone] Relation: Daughter    Allergies: Ambien [zolpidem tartrate]; Codeine phosphate; Darvocet [propoxyphene n-acetaminophen]; Fish-derived products; Promethazine hcl; Sulfamethoxazole; Vicodin [hydrocodone-acetaminophen]; Amoxicillin; and Penicillins  Chief Complaint  Patient presents with  . Medical Management of Chronic Issues    Routine [name] visit    HPI: Patient is 85 y.o. female who is being seen for routine issues of OAB, PMR, and RLS.  Past Medical History:  Diagnosis Date  . Anemia 03/02/2012  . Angina pectoris, unstable ([place]) 03/02/2012  . Anxiety   . Axillary abscess   . Bradycardia 06/13/2012  . CAD (coronary artery disease)    S/p PTCA / stenting (last cath 2004, multiple LAD stents, 2 stents in the right coronary artery all patent)  . Cancer ([place])   . Complication of anesthesia   . Compression fracture of L2 ([place]) 03/12/2017  . Dementia ([place])   . Dysphagia 10/06/2012  . Dyspnea 02/03/2012  . GERD (gastroesophageal reflux disease)   . History of pulmonary embolus (PE) 03/19/2017  . HTN (hypertension)   . PMR (polymyalgia rheumatica) (HCC)   . PONV (postoperative nausea and vomiting)   . Restless leg syndrome   . 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 Started coumadin  . Weakness of both legs 06/09/2012    Past Surgical History:  Procedure Laterality Date  . BREAST LUMPECTOMY    . COLECTOMY    .  ESOPHAGOGASTRODUODENOSCOPY (EGD) WITH ESOPHAGEAL DILATION  10/10/2012   Procedure: ESOPHAGOGASTRODUODENOSCOPY (EGD) WITH ESOPHAGEAL DILATION;  Surgeon: Inda Castle, MD;  Location: [facility];  Service: Endoscopy;  Laterality: N/A;  . FEMUR IM NAIL Left 12/13/2018   Procedure: INTRAMEDULLARY (IM) NAIL FEMORAL LEFT;  Surgeon: Paralee Cancel, MD;  Location: [facility];  Service: Orthopedics;  Laterality: Left;  . heart stents  x 8  . INCISION AND DRAINAGE     bilateral axillary, non specific staff  . INCISION AND DRAINAGE ABSCESS  09/28/2012   Procedure: INCISION AND DRAINAGE ABSCESS;  Surgeon: Zenovia Jarred, MD;  Location: [facility];  Service: General;  Laterality: Bilateral;  . stent     cardiac x 8 stents.    Allergies as of 03/15/2019      Reactions   Ambien [zolpidem Tartrate] Nausea And Vomiting   Codeine Phosphate Nausea And Vomiting   Darvocet [propoxyphene N-acetaminophen] Nausea And Vomiting   Fish-derived Products    Promethazine Hcl Nausea And Vomiting   Sulfamethoxazole Other (See Comments)   Pt doesn't remember reaction   Vicodin [hydrocodone-acetaminophen] Other (See Comments)   Unknown reaction   Amoxicillin Nausea And Vomiting, Rash   Penicillins Nausea And Vomiting, Rash      Medication List       Accurate as of Mar 15, 2019 11:59 PM. If you have any questions, ask your nurse or doctor.        STOP taking these medications   mirtazapine 15 MG tablet Commonly known as:  REMERON Stopped by:  Inocencio Homes, MD  TAKE these medications   acetaminophen 325 MG tablet Commonly known as:  TYLENOL Take 650 mg by mouth every 6 (six) hours as needed for mild pain, moderate pain or fever. What changed:  Another medication with the same name was removed. Continue taking this medication, and follow the directions you see here. Changed by:  Inocencio Homes, MD   acetaminophen 325 MG tablet Commonly known as:  TYLENOL Take 650 mg by mouth every 6 (six) hours. Take  routinely for left hip pain What changed:  Another medication with the same name was removed. Continue taking this medication, and follow the directions you see here. Changed by:  Inocencio Homes, MD   ALPRAZolam 0.5 MG tablet Commonly known as:  XANAX Take 1 tablet (0.5 mg total) by mouth daily.   amLODipine 10 MG tablet Commonly known as:  NORVASC Take 10 mg by mouth every morning.   aspirin EC 81 MG tablet Take 1 tablet (81 mg total) by mouth daily.   atorvastatin 10 MG tablet Commonly known as:  LIPITOR Take 10 mg by mouth every evening.   cholecalciferol 1000 units tablet Commonly known as:  VITAMIN D Take 2,000 Units by mouth daily with lunch.   donepezil 10 MG tablet Commonly known as:  ARICEPT Take 10 mg by mouth every evening.   ENEMA RE Place 1 enema rectally daily as needed (constipation).   losartan 50 MG tablet Commonly known as:  COZAAR Take 50 mg by mouth at bedtime.   magnesium hydroxide 400 MG/5ML suspension Commonly known as:  MILK OF MAGNESIA Take 30 mLs by mouth daily as needed for mild constipation.   multivitamin with minerals tablet Take 1 tablet by mouth daily.   nitroGLYCERIN 0.4 MG SL tablet Commonly known as:  NITROSTAT Place 0.4 mg under the tongue every 5 (five) minutes as needed for chest pain.   Nutritional Supplement Liqd Take 120 mLs by mouth 3 (three) times daily. MedPass   NUTRITIONAL SUPPLEMENT PO Take 1 each by mouth 2 (two) times a day. Magic Cup - take with lunch and dinner   ondansetron 4 MG tablet Commonly known as:  ZOFRAN Take 4 mg by mouth every 4 (four) hours as needed for nausea.   oxybutynin 5 MG tablet Commonly known as:  DITROPAN Take 5 mg by mouth 2 (two) times daily.   polyethylene glycol 17 g packet Commonly known as:  MIRALAX / GLYCOLAX Take 17 g by mouth daily as needed for moderate constipation.   predniSONE 1 MG tablet Commonly known as:  DELTASONE Take 1 mg by mouth daily with breakfast.    ProAir HFA 108 (90 Base) MCG/ACT inhaler Generic drug:  albuterol Inhale 2 puffs into the lungs every 6 (six) hours as needed for wheezing or shortness of breath.   rOPINIRole 1 MG tablet Commonly known as:  REQUIP Take 1 mg by mouth at bedtime. 1mg  at noon, 1mg  2 hours before bed, then 1mg  at bedtime   senna-docusate 8.6-50 MG tablet Commonly known as:  Senokot-S Take 1 tablet by mouth 2 (two) times daily.   Silenor 6 MG Tabs Generic drug:  Doxepin HCl Take 0.5 tablets (3 mg total) by mouth at bedtime.   traMADol 50 MG tablet Commonly known as:  ULTRAM Take 1 tablet (50 mg total) by mouth 2 (two) times daily.   Tums 500 MG chewable tablet Generic drug:  calcium carbonate Chew 1 tablet by mouth 3 (three) times daily as needed for indigestion or heartburn.  warfarin 2.5 MG tablet Commonly known as:  COUMADIN Take 2.5 mg by mouth daily.       No orders of the defined types were placed in this encounter.   Immunization History  Administered Date(s) Administered  . Influenza-Unspecified 08/01/2016, 08/11/2017, 07/02/2018  . PPD Test 03/18/2017  . Pneumococcal Conjugate-13 09/17/2017    Social History   Tobacco Use  . Smoking status: Never Smoker  . Smokeless tobacco: Never Used  Substance Use Topics  . Alcohol use: No    Review of Systems  DATA OBTAINED: from patient, nurse GENERAL:  no fevers, fatigue, appetite changes SKIN: No itching, rash HEENT: No complaint RESPIRATORY: No cough, wheezing, SOB CARDIAC: No chest pain, palpitations, lower extremity edema  GI: No abdominal pain, No N/V/D or constipation, No heartburn or reflux  GU: No dysuria, frequency or urgency, or incontinence  MUSCULOSKELETAL: No unrelieved bone/joint pain NEUROLOGIC: No headache, dizziness  PSYCHIATRIC: No overt anxiety or sadness  Vitals:   03/15/19 1202  BP: 131/61  Pulse: 94  Resp: 20  Temp: (!) 97.1 F (36.2 C)  SpO2: 96%   Body mass index is 23.35 kg/m. Physical  Exam  GENERAL APPEARANCE: Alert, conversant, No acute distress  SKIN: No diaphoresis rash HEENT: Unremarkable RESPIRATORY: Breathing is even, unlabored. Lung sounds are clear   CARDIOVASCULAR: Heart RRR soft 3/6 murmur, no rubs or gallops. No peripheral edema  GASTROINTESTINAL: Abdomen is soft, non-tender, not distended w/ normal bowel sounds.  GENITOURINARY: Bladder non tender, not distended  MUSCULOSKELETAL: No abnormal joints or musculature NEUROLOGIC: Cranial nerves 2-12 grossly intact. Moves all extremities PSYCHIATRIC: Mood and affect with dementia, no behavioral issues  Patient Active Problem List   Diagnosis Date Noted  . Failure to thrive in adult 01/08/2019  . Moderate protein malnutrition ([place]) 01/08/2019  . Pressure injury of skin 12/18/2018  . Intertrochanteric fracture of left femur ([place]) 12/11/2018  . CKD (chronic kidney disease), stage III ([place]) 12/11/2018  . Depression with anxiety 12/11/2018  . Overactive bladder 09/24/2017  . Insomnia 07/31/2017  . Anxiety 06/16/2017  . Citrobacter infection 03/19/2017  . AKI (acute kidney injury) ([place]) 03/19/2017  . History of pulmonary embolus (PE) 03/19/2017  . Elevated INR 03/19/2017  . Candidal intertrigo 03/19/2017  . Compression fracture of L2 ([place]) 03/12/2017  . Acute lower UTI 03/12/2017  . Accelerated hypertension 03/12/2017  . Leukocytosis 07/31/2015  . Acute bronchitis 07/28/2015  . Chronic anticoagulation 07/28/2015  . Intractable back pain 07/20/2015  . Back pain 07/20/2015  . Cellulitis and abscess of left buttock 04/12/2013  . Stricture and stenosis of esophagus 10/10/2012  . Dementia ([place]) 10/06/2012  . Dysphagia 10/06/2012  . Axillary abscess - bilateral, multiple 09/27/2012  . Hypotension 08/24/2012  . Near syncope 08/24/2012  . Abscess of axilla, left 06/27/2012  . Bradycardia 06/13/2012  . Abscess 06/11/2012  . Chest pain 06/09/2012  . Cellulitis 06/09/2012  . Weakness of both legs 06/09/2012   . PMR (polymyalgia rheumatica) (HCC)   . Restless leg syndrome   . Angina pectoris, unstable ([place]) 03/02/2012  . Acute blood loss as cause of postoperative anemia 03/02/2012  . Dyspnea 02/03/2012  . UTI (urinary tract infection) 02/03/2012  . CAD (coronary artery disease)   . HTN (hypertension)   . VTE (venous thromboembolism) 10/01/2010  . HYPERCHOLESTEROLEMIA  IIA 08/27/2009  . Hyperlipidemia 12/11/2008  . HYPERTENSION, BENIGN 12/11/2008    CMP     Component Value Date/Time   NA 142 12/27/2018   K 3.6 12/27/2018  CL 105 12/17/2018 0417   CO2 22 12/17/2018 0417   GLUCOSE 104 (H) 12/17/2018 0417   BUN 32 (A) 12/27/2018   CREATININE 0.9 12/27/2018   CREATININE 0.99 12/17/2018 0417   CALCIUM 7.7 (L) 12/17/2018 0417   PROT 6.7 03/11/2017 2031   ALBUMIN 3.4 (L) 03/11/2017 2031   AST 9 (A) 08/01/2018   ALT 7 08/01/2018   ALKPHOS 58 08/01/2018   BILITOT 0.5 03/11/2017 2031   GFRNONAA 48 (L) 12/17/2018 0417   GFRAA 56 (L) 12/17/2018 0417   Recent Labs    12/14/18 0536 12/15/18 0911 12/16/18 0611 12/17/18 0417 12/27/18  NA 138 138 136 135 142  K 3.9 3.4* 4.0 4.1 3.6  CL 110 108 108 105  --   CO2 22 23 22 22   --   GLUCOSE 144* 85 96 104*  --   BUN 25* 25* 21 20 32*  CREATININE 1.13* 0.81 0.86 0.99 0.9  CALCIUM 8.3* 7.8* 7.8* 7.7*  --   MG 2.1 2.3 2.2  --   --    Recent Labs    08/01/18  AST 9*  ALT 7  ALKPHOS 58   Recent Labs    12/11/18 2308  12/16/18 0611 12/17/18 0417 12/18/18 0408 12/27/18  WBC 14.6*   < > 7.4 7.3 8.2 12.2  NEUTROABS 12.6*  --   --  4.7 5.3  --   HGB 11.0*   < > 7.3* 9.4* 9.5* 9.0*  HCT 36.4   < > 24.5* 31.3* 31.4* 28*  MCV 83.7   < > 83.3 83.2 83.3  --   PLT 311   < > 282 265 257 404*   < > = values in this interval not displayed.   Recent Labs    08/01/18  CHOL 140  LDLCALC 80  TRIG 82   No results found for: [facility] Lab Results  Component Value Date   TSH 1.71 08/01/2018   Lab Results  Component Value Date    HGBA1C 5.7 08/01/2018   Lab Results  Component Value Date   CHOL 140 08/01/2018   HDL 46 08/01/2018   LDLCALC 80 08/01/2018   TRIG 82 08/01/2018   CHOLHDL 2.4 03/03/2012    Significant Diagnostic Results in last 30 days:  No results found.  Assessment and Plan  Overactive bladder No recent reported infection; continue Ditropan 5 mg twice daily  PMR (polymyalgia rheumatica) (HCC) No reported problems; continue prednisone 1 mg daily  Restless leg syndrome No reported complaints; continue Requip 1 mg 3 times daily      Baby Stairs D. [name], MD

## 2019-03-16 DIAGNOSIS — S72142D Displaced intertrochanteric fracture of left femur, subsequent encounter for closed fracture with routine healing: Secondary | ICD-10-CM | POA: Diagnosis not present

## 2019-03-16 DIAGNOSIS — M6281 Muscle weakness (generalized): Secondary | ICD-10-CM | POA: Diagnosis not present

## 2019-03-16 DIAGNOSIS — R2681 Unsteadiness on feet: Secondary | ICD-10-CM | POA: Diagnosis not present

## 2019-03-16 DIAGNOSIS — N183 Chronic kidney disease, stage 3 (moderate): Secondary | ICD-10-CM | POA: Diagnosis not present

## 2019-03-16 DIAGNOSIS — Z9181 History of falling: Secondary | ICD-10-CM | POA: Diagnosis not present

## 2019-03-17 ENCOUNTER — Encounter: Payer: Self-pay | Admitting: Internal Medicine

## 2019-03-17 NOTE — Assessment & Plan Note (Signed)
No reported complaints; continue Requip 1 mg 3 times daily

## 2019-03-17 NOTE — Assessment & Plan Note (Signed)
No recent reported infection; continue Ditropan 5 mg twice daily

## 2019-03-17 NOTE — Assessment & Plan Note (Signed)
No reported problems; continue prednisone 1 mg daily

## 2019-03-18 DIAGNOSIS — R2681 Unsteadiness on feet: Secondary | ICD-10-CM | POA: Diagnosis not present

## 2019-03-18 DIAGNOSIS — Z9181 History of falling: Secondary | ICD-10-CM | POA: Diagnosis not present

## 2019-03-18 DIAGNOSIS — S72142D Displaced intertrochanteric fracture of left femur, subsequent encounter for closed fracture with routine healing: Secondary | ICD-10-CM | POA: Diagnosis not present

## 2019-03-18 DIAGNOSIS — M6281 Muscle weakness (generalized): Secondary | ICD-10-CM | POA: Diagnosis not present

## 2019-03-18 DIAGNOSIS — N183 Chronic kidney disease, stage 3 (moderate): Secondary | ICD-10-CM | POA: Diagnosis not present

## 2019-03-19 DIAGNOSIS — S72142D Displaced intertrochanteric fracture of left femur, subsequent encounter for closed fracture with routine healing: Secondary | ICD-10-CM | POA: Diagnosis not present

## 2019-03-19 DIAGNOSIS — R2681 Unsteadiness on feet: Secondary | ICD-10-CM | POA: Diagnosis not present

## 2019-03-19 DIAGNOSIS — Z9181 History of falling: Secondary | ICD-10-CM | POA: Diagnosis not present

## 2019-03-19 DIAGNOSIS — M6281 Muscle weakness (generalized): Secondary | ICD-10-CM | POA: Diagnosis not present

## 2019-03-19 DIAGNOSIS — N183 Chronic kidney disease, stage 3 (moderate): Secondary | ICD-10-CM | POA: Diagnosis not present

## 2019-03-20 DIAGNOSIS — Z9181 History of falling: Secondary | ICD-10-CM | POA: Diagnosis not present

## 2019-03-20 DIAGNOSIS — M6281 Muscle weakness (generalized): Secondary | ICD-10-CM | POA: Diagnosis not present

## 2019-03-20 DIAGNOSIS — N183 Chronic kidney disease, stage 3 (moderate): Secondary | ICD-10-CM | POA: Diagnosis not present

## 2019-03-20 DIAGNOSIS — S72142D Displaced intertrochanteric fracture of left femur, subsequent encounter for closed fracture with routine healing: Secondary | ICD-10-CM | POA: Diagnosis not present

## 2019-03-20 DIAGNOSIS — R2681 Unsteadiness on feet: Secondary | ICD-10-CM | POA: Diagnosis not present

## 2019-03-21 DIAGNOSIS — N183 Chronic kidney disease, stage 3 (moderate): Secondary | ICD-10-CM | POA: Diagnosis not present

## 2019-03-21 DIAGNOSIS — Z9181 History of falling: Secondary | ICD-10-CM | POA: Diagnosis not present

## 2019-03-21 DIAGNOSIS — R2681 Unsteadiness on feet: Secondary | ICD-10-CM | POA: Diagnosis not present

## 2019-03-21 DIAGNOSIS — M6281 Muscle weakness (generalized): Secondary | ICD-10-CM | POA: Diagnosis not present

## 2019-03-21 DIAGNOSIS — S72142D Displaced intertrochanteric fracture of left femur, subsequent encounter for closed fracture with routine healing: Secondary | ICD-10-CM | POA: Diagnosis not present

## 2019-03-22 DIAGNOSIS — M6281 Muscle weakness (generalized): Secondary | ICD-10-CM | POA: Diagnosis not present

## 2019-03-22 DIAGNOSIS — S72142D Displaced intertrochanteric fracture of left femur, subsequent encounter for closed fracture with routine healing: Secondary | ICD-10-CM | POA: Diagnosis not present

## 2019-03-22 DIAGNOSIS — N183 Chronic kidney disease, stage 3 (moderate): Secondary | ICD-10-CM | POA: Diagnosis not present

## 2019-03-22 DIAGNOSIS — R2681 Unsteadiness on feet: Secondary | ICD-10-CM | POA: Diagnosis not present

## 2019-03-22 DIAGNOSIS — Z9181 History of falling: Secondary | ICD-10-CM | POA: Diagnosis not present

## 2019-03-23 DIAGNOSIS — R2681 Unsteadiness on feet: Secondary | ICD-10-CM | POA: Diagnosis not present

## 2019-03-23 DIAGNOSIS — M6281 Muscle weakness (generalized): Secondary | ICD-10-CM | POA: Diagnosis not present

## 2019-03-23 DIAGNOSIS — S72142D Displaced intertrochanteric fracture of left femur, subsequent encounter for closed fracture with routine healing: Secondary | ICD-10-CM | POA: Diagnosis not present

## 2019-03-23 DIAGNOSIS — N183 Chronic kidney disease, stage 3 (moderate): Secondary | ICD-10-CM | POA: Diagnosis not present

## 2019-03-23 DIAGNOSIS — Z9181 History of falling: Secondary | ICD-10-CM | POA: Diagnosis not present

## 2019-03-26 DIAGNOSIS — M6281 Muscle weakness (generalized): Secondary | ICD-10-CM | POA: Diagnosis not present

## 2019-03-26 DIAGNOSIS — R2681 Unsteadiness on feet: Secondary | ICD-10-CM | POA: Diagnosis not present

## 2019-03-26 DIAGNOSIS — N183 Chronic kidney disease, stage 3 (moderate): Secondary | ICD-10-CM | POA: Diagnosis not present

## 2019-03-26 DIAGNOSIS — S72142D Displaced intertrochanteric fracture of left femur, subsequent encounter for closed fracture with routine healing: Secondary | ICD-10-CM | POA: Diagnosis not present

## 2019-03-26 DIAGNOSIS — Z9181 History of falling: Secondary | ICD-10-CM | POA: Diagnosis not present

## 2019-03-27 DIAGNOSIS — S72142D Displaced intertrochanteric fracture of left femur, subsequent encounter for closed fracture with routine healing: Secondary | ICD-10-CM | POA: Diagnosis not present

## 2019-03-27 DIAGNOSIS — Z9181 History of falling: Secondary | ICD-10-CM | POA: Diagnosis not present

## 2019-03-27 DIAGNOSIS — R2681 Unsteadiness on feet: Secondary | ICD-10-CM | POA: Diagnosis not present

## 2019-03-27 DIAGNOSIS — M6281 Muscle weakness (generalized): Secondary | ICD-10-CM | POA: Diagnosis not present

## 2019-03-27 DIAGNOSIS — N183 Chronic kidney disease, stage 3 (moderate): Secondary | ICD-10-CM | POA: Diagnosis not present

## 2019-04-10 DIAGNOSIS — R41841 Cognitive communication deficit: Secondary | ICD-10-CM | POA: Diagnosis not present

## 2019-04-11 ENCOUNTER — Encounter: Payer: Self-pay | Admitting: Internal Medicine

## 2019-04-11 ENCOUNTER — Non-Acute Institutional Stay (SKILLED_NURSING_FACILITY): Payer: Medicare Other | Admitting: Internal Medicine

## 2019-04-11 DIAGNOSIS — Z86711 Personal history of pulmonary embolism: Secondary | ICD-10-CM

## 2019-04-11 DIAGNOSIS — E785 Hyperlipidemia, unspecified: Secondary | ICD-10-CM | POA: Diagnosis not present

## 2019-04-11 DIAGNOSIS — F0280 Dementia in other diseases classified elsewhere without behavioral disturbance: Secondary | ICD-10-CM | POA: Diagnosis not present

## 2019-04-11 DIAGNOSIS — G301 Alzheimer's disease with late onset: Secondary | ICD-10-CM

## 2019-04-11 NOTE — Progress Notes (Signed)
Location:  [facility] Room Number: [place] of Service:  SNF (31)  Hennie Duos, MD  Patient Care Team: Hennie Duos, MD as PCP - General (Internal Medicine)  Extended Emergency Contact Information Primary Emergency Contact: Brummitt,[name] Address: [address]          [place], [place] [address] [name] of [place] Phone: [phone] Mobile Phone: [phone] Relation: Son Secondary Emergency Contact: [place] of [place] Phone: [phone] Relation: Daughter    Allergies: Ambien [zolpidem tartrate], Codeine phosphate, Darvocet [propoxyphene n-acetaminophen], Fish-derived products, Promethazine hcl, Sulfamethoxazole, Vicodin [hydrocodone-acetaminophen], Amoxicillin, and Penicillins  Chief Complaint  Patient presents with  . Medical Management of Chronic Issues    Routine Visit   . Best Practice Recommendations    Due for PPSV23    HPI: Patient is 85 y.o. female who is being seen for routine issues of dementia, hyperlipidemia, and history of PE.  Past Medical History:  Diagnosis Date  . Anemia 03/02/2012  . Angina pectoris, unstable ([place]) 03/02/2012  . Anxiety   . Axillary abscess   . Bradycardia 06/13/2012  . CAD (coronary artery disease)    S/p PTCA / stenting (last cath 2004, multiple LAD stents, 2 stents in the right coronary artery all patent)  . Cancer ([place])   . Complication of anesthesia   . Compression fracture of L2 ([place]) 03/12/2017  . Dementia ([place])   . Dysphagia 10/06/2012  . Dyspnea 02/03/2012  . GERD (gastroesophageal reflux disease)   . History of pulmonary embolus (PE) 03/19/2017  . HTN (hypertension)   . PMR (polymyalgia rheumatica) (HCC)   . PONV (postoperative nausea and vomiting)   . Restless leg syndrome   . 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 Started coumadin  . Weakness of both legs 06/09/2012    Past Surgical History:  Procedure Laterality Date   . BREAST LUMPECTOMY    . COLECTOMY    . ESOPHAGOGASTRODUODENOSCOPY (EGD) WITH ESOPHAGEAL DILATION  10/10/2012   Procedure: ESOPHAGOGASTRODUODENOSCOPY (EGD) WITH ESOPHAGEAL DILATION;  Surgeon: Inda Castle, MD;  Location: [facility];  Service: Endoscopy;  Laterality: N/A;  . FEMUR IM NAIL Left 12/13/2018   Procedure: INTRAMEDULLARY (IM) NAIL FEMORAL LEFT;  Surgeon: Paralee Cancel, MD;  [facility];  Service: Orthopedics;  Laterality: Left;  . heart stents  x 8  . INCISION AND DRAINAGE     bilateral axillary, non specific staff  . INCISION AND DRAINAGE ABSCESS  09/28/2012   Procedure: INCISION AND DRAINAGE ABSCESS;  Surgeon: Zenovia Jarred, MD;  Location: [facility];  Service: General;  Laterality: Bilateral;  . stent     cardiac x 8 stents.    Allergies as of 04/11/2019      Reactions   Ambien [zolpidem Tartrate] Nausea And Vomiting   Codeine Phosphate Nausea And Vomiting   Darvocet [propoxyphene N-acetaminophen] Nausea And Vomiting   Fish-derived Products    Promethazine Hcl Nausea And Vomiting   Sulfamethoxazole Other (See Comments)   Pt doesn't remember reaction   Vicodin [hydrocodone-acetaminophen] Other (See Comments)   Unknown reaction   Amoxicillin Nausea And Vomiting, Rash   Penicillins Nausea And Vomiting, Rash      Medication List       Accurate as of April 11, 2019 11:59 PM. If you have any questions, ask your nurse or doctor.        STOP taking these medications   ENEMA RE Stopped by:  Inocencio Homes, MD   magnesium hydroxide 400 MG/5ML suspension Commonly known as: MILK OF MAGNESIA Stopped by: Inocencio Homes, MD     TAKE these medications   acetaminophen 325 MG tablet Commonly known as: TYLENOL Take 650 mg by mouth every 6 (six) hours as needed for mild pain, moderate pain or fever.   acetaminophen 325 MG tablet Commonly known as: TYLENOL Take 650 mg by mouth every 6 (six) hours. Take routinely for left hip pain   ALPRAZolam 0.5 MG tablet  Commonly known as: XANAX Take 1 tablet (0.5 mg total) by mouth daily.   amLODipine 10 MG tablet Commonly known as: NORVASC Take 10 mg by mouth every morning.   aspirin EC 81 MG tablet Take 1 tablet (81 mg total) by mouth daily.   atorvastatin 10 MG tablet Commonly known as: LIPITOR Take 10 mg by mouth every evening.   cholecalciferol 1000 units tablet Commonly known as: VITAMIN D Take 2,000 Units by mouth daily with lunch.   donepezil 10 MG tablet Commonly known as: ARICEPT Take 10 mg by mouth every evening.   losartan 50 MG tablet Commonly known as: COZAAR Take 50 mg by mouth at bedtime.   multivitamin with minerals tablet Take 1 tablet by mouth daily.   nitroGLYCERIN 0.4 MG SL tablet Commonly known as: NITROSTAT Place 0.4 mg under the tongue every 5 (five) minutes as needed for chest pain.   NON FORMULARY Diet Type:  Liberalized due to poor appetite to [place] soft   NUTRITIONAL SUPPLEMENT PO Take 1 each by mouth 2 (two) times a day. Magic Cup - take with lunch and dinner What changed: Another medication with the same name was removed. Continue taking this medication, and follow the directions you see here. Changed by: Inocencio Homes, MD   ondansetron 4 MG tablet Commonly known as: ZOFRAN Take 4 mg by mouth every 4 (four) hours as needed for nausea.   oxybutynin 5 MG tablet Commonly known as: DITROPAN Take 5 mg by mouth 2 (two) times daily.   polyethylene glycol 17 g packet Commonly known as: MIRALAX / GLYCOLAX Take 17 g by mouth daily as needed for moderate constipation.   predniSONE 1 MG tablet Commonly known as: DELTASONE Take 1 mg by mouth daily with breakfast.   ProAir HFA 108 (90 Base) MCG/ACT inhaler Generic drug: albuterol Inhale 2 puffs into the lungs every 6 (six) hours as needed for wheezing or shortness of breath.   rOPINIRole 1 MG tablet Commonly known as: REQUIP Take 1 mg by mouth at bedtime. 1mg  at noon, 1mg  2 hours before bed, then 1mg  at  bedtime   senna-docusate 8.6-50 MG tablet Commonly known as: Senokot-S Take 1 tablet by mouth 2 (two) times daily.   Silenor 6 MG Tabs Generic drug: Doxepin HCl Take 0.5 tablets (3 mg total) by mouth at bedtime.   traMADol 50 MG tablet Commonly known as: ULTRAM Take 1 tablet (50 mg total) by mouth 2 (two) times daily.   Tums 500 MG chewable tablet Generic drug: calcium carbonate Chew 1 tablet by mouth 3 (three) times daily as needed for indigestion or heartburn.   warfarin 2 MG tablet Commonly known as: COUMADIN Take 2 mg by mouth daily.       No orders of the defined types were placed in this encounter.   Immunization History  Administered Date(s) Administered  . Influenza-Unspecified 08/01/2016, 08/11/2017, 07/02/2018  . PPD Test 03/18/2017  . Pneumococcal Conjugate-13 09/17/2017    Social History  Tobacco Use  . Smoking status: Never Smoker  . Smokeless tobacco: Never Used  Substance Use Topics  . Alcohol use: No    Review of Systems  DATA OBTAINED: from patient, nurse GENERAL:  no fevers, fatigue, appetite changes SKIN: No itching, rash HEENT: No complaint RESPIRATORY: No cough, wheezing, SOB CARDIAC: No chest pain, palpitations, lower extremity edema  GI: No abdominal pain, No N/V/D or constipation, No heartburn or reflux  GU: No dysuria, frequency or urgency, or incontinence  MUSCULOSKELETAL: No unrelieved bone/joint pain NEUROLOGIC: No headache, dizziness  PSYCHIATRIC: No overt anxiety or sadness  Vitals:   04/11/19 0838  BP: 130/62  Pulse: (!) 52  Resp: 18  Temp: (!) 97.4 F (36.3 C)   Body mass index is 23.35 kg/m. Physical Exam  GENERAL APPEARANCE: Alert, conversant, No acute distress  SKIN: No diaphoresis rash HEENT: Unremarkable RESPIRATORY: Breathing is even, unlabored. Lung sounds are clear   CARDIOVASCULAR: Heart RRR 3/6 systolic murmur, no rubs or gallops. No peripheral edema  GASTROINTESTINAL: Abdomen is soft, non-tender,  not distended w/ normal bowel sounds.  GENITOURINARY: Bladder non tender, not distended  MUSCULOSKELETAL: No abnormal joints or musculature NEUROLOGIC: Cranial nerves 2-12 grossly intact. Moves all extremities PSYCHIATRIC: Mood and affect appropriate to situation, no behavioral issues  Patient Active Problem List   Diagnosis Date Noted  . Failure to thrive in adult 01/08/2019  . Moderate protein malnutrition ([place]) 01/08/2019  . Pressure injury of skin 12/18/2018  . Intertrochanteric fracture of left femur ([place]) 12/11/2018  . CKD (chronic kidney disease), stage III ([place]) 12/11/2018  . Depression with anxiety 12/11/2018  . Overactive bladder 09/24/2017  . Insomnia 07/31/2017  . Anxiety 06/16/2017  . Citrobacter infection 03/19/2017  . AKI (acute kidney injury) ([place]) 03/19/2017  . History of pulmonary embolus (PE) 03/19/2017  . Elevated INR 03/19/2017  . Candidal intertrigo 03/19/2017  . Compression fracture of L2 ([place]) 03/12/2017  . Acute lower UTI 03/12/2017  . Accelerated hypertension 03/12/2017  . Leukocytosis 07/31/2015  . Acute bronchitis 07/28/2015  . Chronic anticoagulation 07/28/2015  . Intractable back pain 07/20/2015  . Back pain 07/20/2015  . Cellulitis and abscess of left buttock 04/12/2013  . Stricture and stenosis of esophagus 10/10/2012  . Dementia ([place]) 10/06/2012  . Dysphagia 10/06/2012  . Axillary abscess - bilateral, multiple 09/27/2012  . Hypotension 08/24/2012  . Near syncope 08/24/2012  . Abscess of axilla, left 06/27/2012  . Bradycardia 06/13/2012  . Abscess 06/11/2012  . Chest pain 06/09/2012  . Cellulitis 06/09/2012  . Weakness of both legs 06/09/2012  . PMR (polymyalgia rheumatica) (HCC)   . Restless leg syndrome   . Angina pectoris, unstable ([place]) 03/02/2012  . Acute blood loss as cause of postoperative anemia 03/02/2012  . Dyspnea 02/03/2012  . UTI (urinary tract infection) 02/03/2012  . CAD (coronary artery disease)   . HTN (hypertension)    . VTE (venous thromboembolism) 10/01/2010  . HYPERCHOLESTEROLEMIA  IIA 08/27/2009  . Hyperlipidemia 12/11/2008  . HYPERTENSION, BENIGN 12/11/2008    CMP     Component Value Date/Time   NA 142 12/27/2018   K 3.6 12/27/2018   CL 105 12/17/2018 0417   CO2 22 12/17/2018 0417   GLUCOSE 104 (H) 12/17/2018 0417   BUN 32 (A) 12/27/2018   CREATININE 0.9 12/27/2018   CREATININE 0.99 12/17/2018 0417   CALCIUM 7.7 (L) 12/17/2018 0417   PROT 6.7 03/11/2017 2031   ALBUMIN 3.4 (L) 03/11/2017 2031   AST 9 (A) 08/01/2018   ALT  7 08/01/2018   ALKPHOS 58 08/01/2018   BILITOT 0.5 03/11/2017 2031   GFRNONAA 48 (L) 12/17/2018 0417   GFRAA 56 (L) 12/17/2018 0417   Recent Labs    12/14/18 0536 12/15/18 0911 12/16/18 0611 12/17/18 0417 12/27/18  NA 138 138 136 135 142  K 3.9 3.4* 4.0 4.1 3.6  CL 110 108 108 105  --   CO2 22 23 22 22   --   GLUCOSE 144* 85 96 104*  --   BUN 25* 25* 21 20 32*  CREATININE 1.13* 0.81 0.86 0.99 0.9  CALCIUM 8.3* 7.8* 7.8* 7.7*  --   MG 2.1 2.3 2.2  --   --    Recent Labs    08/01/18  AST 9*  ALT 7  ALKPHOS 58   Recent Labs    12/11/18 2308  12/16/18 0611 12/17/18 0417 12/18/18 0408 12/27/18  WBC 14.6*   < > 7.4 7.3 8.2 12.2  NEUTROABS 12.6*  --   --  4.7 5.3  --   HGB 11.0*   < > 7.3* 9.4* 9.5* 9.0*  HCT 36.4   < > 24.5* 31.3* 31.4* 28*  MCV 83.7   < > 83.3 83.2 83.3  --   PLT 311   < > 282 265 257 404*   < > = values in this interval not displayed.   Recent Labs    08/01/18  CHOL 140  LDLCALC 80  TRIG 82   No results found for: [facility] Lab Results  Component Value Date   TSH 1.71 08/01/2018   Lab Results  Component Value Date   HGBA1C 5.7 08/01/2018   Lab Results  Component Value Date   CHOL 140 08/01/2018   HDL 46 08/01/2018   LDLCALC 80 08/01/2018   TRIG 82 08/01/2018   CHOLHDL 2.4 03/03/2012    Significant Diagnostic Results in last 30 days:  No results found.  Assessment and Plan  Dementia Chronic and stable;  continue Aricept 10 mg daily  Hyperlipidemia LDL 80, HDL 46  History of pulmonary embolus (PE) No reported problems; continue Coumadin 2 mg daily; actively titrated weekly    Hennie Duos, MD

## 2019-04-14 ENCOUNTER — Encounter: Payer: Self-pay | Admitting: Internal Medicine

## 2019-04-14 NOTE — Assessment & Plan Note (Signed)
No reported problems; continue Coumadin 2 mg daily; actively titrated weekly

## 2019-04-14 NOTE — Assessment & Plan Note (Signed)
LDL 80, HDL 46

## 2019-04-14 NOTE — Assessment & Plan Note (Signed)
Chronic and stable; continue Aricept 10 mg daily

## 2019-04-18 DIAGNOSIS — F0390 Unspecified dementia without behavioral disturbance: Secondary | ICD-10-CM | POA: Diagnosis not present

## 2019-04-18 DIAGNOSIS — G4700 Insomnia, unspecified: Secondary | ICD-10-CM | POA: Diagnosis not present

## 2019-04-18 DIAGNOSIS — F419 Anxiety disorder, unspecified: Secondary | ICD-10-CM | POA: Diagnosis not present

## 2019-04-18 DIAGNOSIS — F329 Major depressive disorder, single episode, unspecified: Secondary | ICD-10-CM | POA: Diagnosis not present

## 2019-05-01 ENCOUNTER — Non-Acute Institutional Stay (SKILLED_NURSING_FACILITY): Payer: Medicare Other | Admitting: Internal Medicine

## 2019-05-01 ENCOUNTER — Encounter: Payer: Self-pay | Admitting: Internal Medicine

## 2019-05-01 DIAGNOSIS — Z86711 Personal history of pulmonary embolism: Secondary | ICD-10-CM

## 2019-05-01 DIAGNOSIS — I8290 Acute embolism and thrombosis of unspecified vein: Secondary | ICD-10-CM | POA: Diagnosis not present

## 2019-05-01 DIAGNOSIS — Z7901 Long term (current) use of anticoagulants: Secondary | ICD-10-CM

## 2019-05-01 NOTE — Progress Notes (Signed)
Location:  [facility] Room Number: 204-D Place of Service:  SNF (31)  Hennie Duos, MD  Patient Care Team: Hennie Duos, MD as PCP - General (Internal Medicine)  Extended Emergency Contact Information Primary Emergency Contact: Weare,[name] Address: [address], [place] [address] [name] of [place] Phone: [phone] Mobile Phone: [phone] Relation: Son Secondary Emergency Contact: [place] Phone: [phone] Relation: Daughter    Allergies: Ambien [zolpidem tartrate], Codeine phosphate, Darvocet [propoxyphene n-acetaminophen], Fish-derived products, Promethazine hcl, Sulfamethoxazole, Vicodin [hydrocodone-acetaminophen], Amoxicillin, and Penicillins  Chief Complaint  Patient presents with  . Acute Visit    INR    HPI: Patient is a 85 y.o. female who is being seen for for an INR check for history of DVT and PE.  Patient's INR today is 1.9 on 2 mg daily there are no complaints  Past Medical History:  Diagnosis Date  . Anemia 03/02/2012  . Angina pectoris, unstable ([place]) 03/02/2012  . Anxiety   . Axillary abscess   . Bradycardia 06/13/2012  . CAD (coronary artery disease)    S/p PTCA / stenting (last cath 2004, multiple LAD stents, 2 stents in the right coronary artery all patent)  . Cancer ([place])   . Complication of anesthesia   . Compression fracture of L2 ([place]) 03/12/2017  . Dementia ([place])   . Dysphagia 10/06/2012  . Dyspnea 02/03/2012  . GERD (gastroesophageal reflux disease)   . History of pulmonary embolus (PE) 03/19/2017  . HTN (hypertension)   . PMR (polymyalgia rheumatica) (HCC)   . PONV (postoperative nausea and vomiting)   . Restless leg syndrome   . 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 Started coumadin  . Weakness of both legs 06/09/2012    Past Surgical History:  Procedure Laterality Date  . BREAST LUMPECTOMY    . COLECTOMY     . ESOPHAGOGASTRODUODENOSCOPY (EGD) WITH ESOPHAGEAL DILATION  10/10/2012   Procedure: ESOPHAGOGASTRODUODENOSCOPY (EGD) WITH ESOPHAGEAL DILATION;  Surgeon: Inda Castle, MD;  Location: [facility];  Service: Endoscopy;  Laterality: N/A;  . FEMUR IM NAIL Left 12/13/2018   Procedure: INTRAMEDULLARY (IM) NAIL FEMORAL LEFT;  Surgeon: Paralee Cancel, MD;  [facility];  Service: Orthopedics;  Laterality: Left;  . heart stents  x 8  . INCISION AND DRAINAGE     bilateral axillary, non specific staff  . INCISION AND DRAINAGE ABSCESS  09/28/2012   Procedure: INCISION AND DRAINAGE ABSCESS;  Surgeon: Zenovia Jarred, MD;  Location: [facility];  Service: General;  Laterality: Bilateral;  . stent     cardiac x 8 stents.    Allergies as of 05/01/2019      Reactions   Ambien [zolpidem Tartrate] Nausea And Vomiting   Codeine Phosphate Nausea And Vomiting   Darvocet [propoxyphene N-acetaminophen] Nausea And Vomiting   Fish-derived Products    Promethazine Hcl Nausea And Vomiting   Sulfamethoxazole Other (See Comments)   Pt doesn't remember reaction   Vicodin [hydrocodone-acetaminophen] Other (See Comments)   Unknown reaction   Amoxicillin Nausea And Vomiting, Rash   Penicillins Nausea And Vomiting, Rash      Medication List       Accurate as of May 01, 2019 11:59 PM. If you have any questions, ask your nurse or doctor.        acetaminophen 325 MG tablet Commonly known as: TYLENOL Take 650 mg  by mouth every 6 (six) hours as needed for mild pain, moderate pain or fever.   acetaminophen 325 MG tablet Commonly known as: TYLENOL Take 650 mg by mouth every 6 (six) hours. Take routinely for left hip pain   ALPRAZolam 0.5 MG tablet Commonly known as: XANAX Take 1 tablet (0.5 mg total) by mouth daily.   amLODipine 10 MG tablet Commonly known as: NORVASC Take 10 mg by mouth every morning.   aspirin EC 81 MG tablet Take 1 tablet (81 mg total) by mouth daily.   atorvastatin 10 MG tablet  Commonly known as: LIPITOR Take 10 mg by mouth every evening.   cholecalciferol 1000 units tablet Commonly known as: VITAMIN D Take 2,000 Units by mouth daily with lunch.   donepezil 10 MG tablet Commonly known as: ARICEPT Take 10 mg by mouth every evening.   losartan 50 MG tablet Commonly known as: COZAAR Take 50 mg by mouth at bedtime.   multivitamin with minerals tablet Take 1 tablet by mouth daily.   nitroGLYCERIN 0.4 MG SL tablet Commonly known as: NITROSTAT Place 0.4 mg under the tongue every 5 (five) minutes as needed for chest pain.   NON FORMULARY Diet Type:  Liberalized due to poor appetite to Mech soft   NUTRITIONAL SUPPLEMENT PO Take 1 each by mouth 2 (two) times a day. Magic Cup - take with lunch and dinner   ondansetron 4 MG tablet Commonly known as: ZOFRAN Take 4 mg by mouth every 4 (four) hours as needed for nausea.   oxybutynin 5 MG tablet Commonly known as: DITROPAN Take 5 mg by mouth 2 (two) times daily.   polyethylene glycol 17 g packet Commonly known as: MIRALAX / GLYCOLAX Take 17 g by mouth daily as needed for moderate constipation.   predniSONE 1 MG tablet Commonly known as: DELTASONE Take 1 mg by mouth daily with breakfast.   ProAir HFA 108 (90 Base) MCG/ACT inhaler Generic drug: albuterol Inhale 2 puffs into the lungs every 6 (six) hours as needed for wheezing or shortness of breath.   rOPINIRole 1 MG tablet Commonly known as: REQUIP Take 1 mg by mouth at bedtime. 1mg  at noon, 1mg  2 hours before bed, then 1mg  at bedtime   senna-docusate 8.6-50 MG tablet Commonly known as: Senokot-S Take 1 tablet by mouth 2 (two) times daily.   Silenor 6 MG Tabs Generic drug: Doxepin HCl Take 0.5 tablets (3 mg total) by mouth at bedtime.   traMADol 50 MG tablet Commonly known as: ULTRAM Take 1 tablet (50 mg total) by mouth 2 (two) times daily.   Tums 500 MG chewable tablet Generic drug: calcium carbonate Chew 1 tablet by mouth 3 (three) times  daily as needed for indigestion or heartburn.   warfarin 2 MG tablet Commonly known as: COUMADIN Take 2 mg by mouth daily.       No orders of the defined types were placed in this encounter.   Immunization History  Administered Date(s) Administered  . Influenza-Unspecified 08/01/2016, 08/11/2017, 07/02/2018  . PPD Test 03/18/2017  . Pneumococcal Conjugate-13 09/17/2017    Social History   Tobacco Use  . Smoking status: Never Smoker  . Smokeless tobacco: Never Used  Substance Use Topics  . Alcohol use: No     Vitals:   05/06/19 1844  BP: 122/69  Pulse: (!) 57  Resp: 19  Temp: 98.2 F (36.8 C)   There is no height or weight on file to calculate BMI.   Patient Active Problem  List   Diagnosis Date Noted  . Failure to thrive in adult 01/08/2019  . Moderate protein malnutrition ([place]) 01/08/2019  . Pressure injury of skin 12/18/2018  . Intertrochanteric fracture of left femur ([place]) 12/11/2018  . CKD (chronic kidney disease), stage III ([place]) 12/11/2018  . Depression with anxiety 12/11/2018  . Overactive bladder 09/24/2017  . Insomnia 07/31/2017  . Anxiety 06/16/2017  . Citrobacter infection 03/19/2017  . AKI (acute kidney injury) ([place]) 03/19/2017  . History of pulmonary embolus (PE) 03/19/2017  . Elevated INR 03/19/2017  . Candidal intertrigo 03/19/2017  . Compression fracture of L2 ([place]) 03/12/2017  . Acute lower UTI 03/12/2017  . Accelerated hypertension 03/12/2017  . Leukocytosis 07/31/2015  . Acute bronchitis 07/28/2015  . Chronic anticoagulation 07/28/2015  . Intractable back pain 07/20/2015  . Back pain 07/20/2015  . Cellulitis and abscess of left buttock 04/12/2013  . Stricture and stenosis of esophagus 10/10/2012  . Dementia ([place]) 10/06/2012  . Dysphagia 10/06/2012  . Axillary abscess - bilateral, multiple 09/27/2012  . Hypotension 08/24/2012  . Near syncope 08/24/2012  . Abscess of axilla, left 06/27/2012  . Bradycardia 06/13/2012  . Abscess  06/11/2012  . Chest pain 06/09/2012  . Cellulitis 06/09/2012  . Weakness of both legs 06/09/2012  . PMR (polymyalgia rheumatica) (HCC)   . Restless leg syndrome   . Angina pectoris, unstable ([place]) 03/02/2012  . Acute blood loss as cause of postoperative anemia 03/02/2012  . Dyspnea 02/03/2012  . UTI (urinary tract infection) 02/03/2012  . CAD (coronary artery disease)   . HTN (hypertension)   . VTE (venous thromboembolism) 10/01/2010  . HYPERCHOLESTEROLEMIA  IIA 08/27/2009  . Hyperlipidemia 12/11/2008  . HYPERTENSION, BENIGN 12/11/2008    CMP     Component Value Date/Time   NA 142 12/27/2018   K 3.6 12/27/2018   CL 105 12/17/2018 0417   CO2 22 12/17/2018 0417   GLUCOSE 104 (H) 12/17/2018 0417   BUN 32 (A) 12/27/2018   CREATININE 0.9 12/27/2018   CREATININE 0.99 12/17/2018 0417   CALCIUM 7.7 (L) 12/17/2018 0417   PROT 6.7 03/11/2017 2031   ALBUMIN 3.4 (L) 03/11/2017 2031   AST 9 (A) 08/01/2018   ALT 7 08/01/2018   ALKPHOS 58 08/01/2018   BILITOT 0.5 03/11/2017 2031   GFRNONAA 48 (L) 12/17/2018 0417   GFRAA 56 (L) 12/17/2018 0417   Recent Labs    12/14/18 0536 12/15/18 0911 12/16/18 0611 12/17/18 0417 12/27/18  NA 138 138 136 135 142  K 3.9 3.4* 4.0 4.1 3.6  CL 110 108 108 105  --   CO2 22 23 22 22   --   GLUCOSE 144* 85 96 104*  --   BUN 25* 25* 21 20 32*  CREATININE 1.13* 0.81 0.86 0.99 0.9  CALCIUM 8.3* 7.8* 7.8* 7.7*  --   MG 2.1 2.3 2.2  --   --    Recent Labs    08/01/18  AST 9*  ALT 7  ALKPHOS 58   Recent Labs    12/11/18 2308  12/16/18 0611 12/17/18 0417 12/18/18 0408 12/27/18  WBC 14.6*   < > 7.4 7.3 8.2 12.2  NEUTROABS 12.6*  --   --  4.7 5.3  --   HGB 11.0*   < > 7.3* 9.4* 9.5* 9.0*  HCT 36.4   < > 24.5* 31.3* 31.4* 28*  MCV 83.7   < > 83.3 83.2 83.3  --   PLT 311   < > 282 265 257 404*   < > =  values in this interval not displayed.   Recent Labs    08/01/18  CHOL 140  LDLCALC 80  TRIG 82   No results found for: [facility]  Results  Component Value Date   TSH 1.71 08/01/2018   Lab Results  Component Value Date   HGBA1C 5.7 08/01/2018   Lab Results  Component Value Date   CHOL 140 08/01/2018   HDL 46 08/01/2018   LDLCALC 80 08/01/2018   TRIG 82 08/01/2018   CHOLHDL 2.4 03/03/2012    Significant Diagnostic Results in last 30 days:  No results found.  Assessment and Plan  History of DVT/history of PE/chronic anticoagulation- patient's INR is 1.9 on 2 mg daily; will give 3 mg today and 2 mg daily after that and repeat INR 1 week 7/7    Hennie Duos, MD

## 2019-05-03 ENCOUNTER — Other Ambulatory Visit: Payer: Self-pay | Admitting: Internal Medicine

## 2019-05-03 MED ORDER — TRAMADOL HCL 50 MG PO TABS: 50.0000 mg | ORAL_TABLET | Freq: Two times a day (BID) | ORAL | 0 refills | Status: DC

## 2019-05-06 ENCOUNTER — Encounter: Payer: Self-pay | Admitting: Internal Medicine

## 2019-05-08 ENCOUNTER — Non-Acute Institutional Stay (SKILLED_NURSING_FACILITY): Payer: Medicare Other | Admitting: Internal Medicine

## 2019-05-08 DIAGNOSIS — Z7901 Long term (current) use of anticoagulants: Secondary | ICD-10-CM

## 2019-05-08 DIAGNOSIS — Z86711 Personal history of pulmonary embolism: Secondary | ICD-10-CM | POA: Diagnosis not present

## 2019-05-09 ENCOUNTER — Non-Acute Institutional Stay (SKILLED_NURSING_FACILITY): Payer: Medicare Other | Admitting: Internal Medicine

## 2019-05-09 ENCOUNTER — Encounter: Payer: Self-pay | Admitting: Internal Medicine

## 2019-05-09 DIAGNOSIS — I8290 Acute embolism and thrombosis of unspecified vein: Secondary | ICD-10-CM

## 2019-05-09 DIAGNOSIS — I10 Essential (primary) hypertension: Secondary | ICD-10-CM | POA: Diagnosis not present

## 2019-05-09 DIAGNOSIS — I2510 Atherosclerotic heart disease of native coronary artery without angina pectoris: Secondary | ICD-10-CM

## 2019-05-09 NOTE — Progress Notes (Signed)
Location:  [place] [facility] [place] Room Number: 204-D Place of Service:  SNF (31)  Hennie Duos, MD  Patient Care Team: Hennie Duos, MD as PCP - General (Internal Medicine)  Extended Emergency Contact Information Primary Emergency Contact: Moen,[name] Address: [address]          [place], [place] [address] [name] of [place] Phone: [phone] Mobile Phone: [phone] Relation: Son Secondary Emergency Contact: [place] of [place] Phone: [phone] Relation: Daughter    Allergies: Ambien [zolpidem tartrate], Codeine phosphate, Darvocet [propoxyphene n-acetaminophen], Fish-derived products, Promethazine hcl, Sulfamethoxazole, Vicodin [hydrocodone-acetaminophen], Amoxicillin, and Penicillins  Chief Complaint  Patient presents with   Medical Management of Chronic Issues    Routine [name] visit   Immunizations    Pneumococcal PPSV-23    HPI: Patient is a 85 y.o. female who Is being seen for routine issues of coronary artery disease, hypertension, and history of DVT.  Past Medical History:  Diagnosis Date   Anemia 03/02/2012   Angina pectoris, unstable ([place]) 03/02/2012   Anxiety    Axillary abscess    Bradycardia 06/13/2012   CAD (coronary artery disease)    S/p PTCA / stenting (last cath 2004, multiple LAD stents, 2 stents in the right coronary artery all patent)   Cancer ([place])    Complication of anesthesia    Compression fracture of L2 ([place]) 03/12/2017   Dementia ([place])    Dysphagia 10/06/2012   Dyspnea 02/03/2012   GERD (gastroesophageal reflux disease)    History of pulmonary embolus (PE) 03/19/2017   HTN (hypertension)    PMR (polymyalgia rheumatica) (HCC)    PONV (postoperative nausea and vomiting)    Restless leg syndrome    VTE (venous thromboembolism) 10/2010   DVT and PE. Started coumadin   Weakness of both legs 06/09/2012    Past Surgical History:    Procedure Laterality Date   BREAST LUMPECTOMY     COLECTOMY     ESOPHAGOGASTRODUODENOSCOPY (EGD) WITH ESOPHAGEAL DILATION  10/10/2012   Procedure: ESOPHAGOGASTRODUODENOSCOPY (EGD) WITH ESOPHAGEAL DILATION;  Surgeon: Inda Castle, MD;  Location: [facility];  Service: Endoscopy;  Laterality: N/A;   FEMUR IM NAIL Left 12/13/2018   Procedure: INTRAMEDULLARY (IM) NAIL FEMORAL LEFT;  Surgeon: Paralee Cancel, MD;  [facility];  Service: Orthopedics;  Laterality: Left;   heart stents  x 8   INCISION AND DRAINAGE     bilateral axillary, non specific staff   INCISION AND DRAINAGE ABSCESS  09/28/2012   Procedure: INCISION AND DRAINAGE ABSCESS;  Surgeon: Zenovia Jarred, MD;  Location: [facility];  Service: General;  Laterality: Bilateral;   stent     cardiac x 8 stents.    Allergies as of 05/09/2019      Reactions   Ambien [zolpidem Tartrate] Nausea And Vomiting   Codeine Phosphate Nausea And Vomiting   Darvocet [propoxyphene N-acetaminophen] Nausea And Vomiting   Fish-derived Products    Promethazine Hcl Nausea And Vomiting   Sulfamethoxazole Other (See Comments)   Pt doesn't remember reaction   Vicodin [hydrocodone-acetaminophen] Other (See Comments)   Unknown reaction   Amoxicillin Nausea And Vomiting, Rash   Penicillins Nausea And Vomiting, Rash      Medication List       Accurate as of May 09, 2019 11:59 PM. If you have any questions, ask your nurse or doctor.        acetaminophen 325 MG tablet Commonly known as: TYLENOL  Take 650 mg by mouth every 6 (six) hours as needed for mild pain, moderate pain or fever.   acetaminophen 325 MG tablet Commonly known as: TYLENOL Take 650 mg by mouth every 6 (six) hours. Take routinely for left hip pain   ALPRAZolam 0.5 MG tablet Commonly known as: XANAX Take 1 tablet (0.5 mg total) by mouth daily.   amLODipine 10 MG tablet Commonly known as: NORVASC Take 10 mg by mouth every morning.   aspirin EC 81 MG tablet Take 1  tablet (81 mg total) by mouth daily.   atorvastatin 10 MG tablet Commonly known as: LIPITOR Take 10 mg by mouth every evening.   cholecalciferol 1000 units tablet Commonly known as: VITAMIN D Take 2,000 Units by mouth daily with lunch.   donepezil 10 MG tablet Commonly known as: ARICEPT Take 10 mg by mouth every evening.   losartan 50 MG tablet Commonly known as: COZAAR Take 50 mg by mouth at bedtime.   multivitamin with minerals tablet Take 1 tablet by mouth daily.   nitroGLYCERIN 0.4 MG SL tablet Commonly known as: NITROSTAT Place 0.4 mg under the tongue every 5 (five) minutes as needed for chest pain.   NON FORMULARY Diet Type:  Liberalized due to poor appetite to Mech soft   NUTRITIONAL SUPPLEMENT PO Take 1 each by mouth 2 (two) times a day. Magic Cup - take with lunch and dinner   ondansetron 4 MG tablet Commonly known as: ZOFRAN Take 4 mg by mouth every 4 (four) hours as needed for nausea.   oxybutynin 5 MG tablet Commonly known as: DITROPAN Take 5 mg by mouth 2 (two) times daily.   polyethylene glycol 17 g packet Commonly known as: MIRALAX / GLYCOLAX Take 17 g by mouth daily as needed for moderate constipation.   predniSONE 1 MG tablet Commonly known as: DELTASONE Take 1 mg by mouth daily with breakfast.   ProAir HFA 108 (90 Base) MCG/ACT inhaler Generic drug: albuterol Inhale 2 puffs into the lungs every 6 (six) hours as needed for wheezing or shortness of breath.   rOPINIRole 1 MG tablet Commonly known as: REQUIP Take 1 mg by mouth See admin instructions. 1mg  at noon, 1mg  2 hours before bed, then 1mg  at bedtime   senna-docusate 8.6-50 MG tablet Commonly known as: Senokot-S Take 1 tablet by mouth 2 (two) times daily.   Silenor 6 MG Tabs Generic drug: Doxepin HCl Take 0.5 tablets (3 mg total) by mouth at bedtime.   traMADol 50 MG tablet Commonly known as: ULTRAM Take 1 tablet (50 mg total) by mouth 2 (two) times daily.   Tums 500 MG chewable  tablet Generic drug: calcium carbonate Chew 1 tablet by mouth 3 (three) times daily as needed for indigestion or heartburn.   warfarin 2 MG tablet Commonly known as: COUMADIN Take 2 mg by mouth daily.       No orders of the defined types were placed in this encounter.   Immunization History  Administered Date(s) Administered   Influenza-Unspecified 08/01/2016, 08/11/2017, 07/02/2018   PPD Test 03/18/2017   Pneumococcal Conjugate-13 09/17/2017    Social History   Tobacco Use   Smoking status: Never Smoker   Smokeless tobacco: Never Used  Substance Use Topics   Alcohol use: No    Review of Systems  DATA OBTAINED: from patient, nurse GENERAL:  no fevers, fatigue, appetite changes SKIN: No itching, rash HEENT: No complaint RESPIRATORY: No cough, wheezing, SOB CARDIAC: No chest pain, palpitations, lower extremity edema  GI: No abdominal pain, No N/V/D or constipation, No heartburn or reflux  GU: No dysuria, frequency or urgency, or incontinence  MUSCULOSKELETAL: No unrelieved bone/joint pain NEUROLOGIC: No headache, dizziness  PSYCHIATRIC: No overt anxiety or sadness  Vitals:   05/09/19 0938  BP: 124/62  Pulse: 80  Resp: 18  Temp: (!) 97.2 F (36.2 C)   Body mass index is 23.35 kg/m. Physical Exam  GENERAL APPEARANCE: Alert, conversant, No acute distress  SKIN: No diaphoresis rash HEENT: Unremarkable RESPIRATORY: Breathing is even, unlabored. Lung sounds are clear   CARDIOVASCULAR: Heart RRR 3/6 soft murmurs, no rubs or gallops. No peripheral edema  GASTROINTESTINAL: Abdomen is soft, non-tender, not distended w/ normal bowel sounds.  GENITOURINARY: Bladder non tender, not distended  MUSCULOSKELETAL: No abnormal joints or musculature NEUROLOGIC: Cranial nerves 2-12 grossly intact. Moves all extremities PSYCHIATRIC: Mood and affect with dementia, no behavioral issues  Patient Active Problem List   Diagnosis Date Noted   Failure to thrive in adult  01/08/2019   Moderate protein malnutrition ([place]) 01/08/2019   Pressure injury of skin 12/18/2018   Intertrochanteric fracture of left femur ([place]) 12/11/2018   CKD (chronic kidney disease), stage III ([place]) 12/11/2018   Depression with anxiety 12/11/2018   Overactive bladder 09/24/2017   Insomnia 07/31/2017   Anxiety 06/16/2017   Citrobacter infection 03/19/2017   AKI (acute kidney injury) ([place]) 03/19/2017   History of pulmonary embolus (PE) 03/19/2017   Elevated INR 03/19/2017   Candidal intertrigo 03/19/2017   Compression fracture of L2 ([place]) 03/12/2017   Acute lower UTI 03/12/2017   Accelerated hypertension 03/12/2017   Leukocytosis 07/31/2015   Acute bronchitis 07/28/2015   Chronic anticoagulation 07/28/2015   Intractable back pain 07/20/2015   Back pain 07/20/2015   Cellulitis and abscess of left buttock 04/12/2013   Stricture and stenosis of esophagus 10/10/2012   Dementia ([place]) 10/06/2012   Dysphagia 10/06/2012   Axillary abscess - bilateral, multiple 09/27/2012   Hypotension 08/24/2012   Near syncope 08/24/2012   Abscess of axilla, left 06/27/2012   Bradycardia 06/13/2012   Abscess 06/11/2012   Chest pain 06/09/2012   Cellulitis 06/09/2012   Weakness of both legs 06/09/2012   PMR (polymyalgia rheumatica) (HCC)    Restless leg syndrome    Angina pectoris, unstable ([place]) 03/02/2012   Acute blood loss as cause of postoperative anemia 03/02/2012   Dyspnea 02/03/2012   UTI (urinary tract infection) 02/03/2012   CAD (coronary artery disease)    HTN (hypertension)    VTE (venous thromboembolism) 10/01/2010   HYPERCHOLESTEROLEMIA  IIA 08/27/2009   Hyperlipidemia 12/11/2008   HYPERTENSION, BENIGN 12/11/2008    CMP     Component Value Date/Time   NA 142 12/27/2018   K 3.6 12/27/2018   CL 105 12/17/2018 0417   CO2 22 12/17/2018 0417   GLUCOSE 104 (H) 12/17/2018 0417   BUN 32 (A) 12/27/2018   CREATININE 0.9 12/27/2018     CREATININE 0.99 12/17/2018 0417   CALCIUM 7.7 (L) 12/17/2018 0417   PROT 6.7 03/11/2017 2031   ALBUMIN 3.4 (L) 03/11/2017 2031   AST 9 (A) 08/01/2018   ALT 7 08/01/2018   ALKPHOS 58 08/01/2018   BILITOT 0.5 03/11/2017 2031   GFRNONAA 48 (L) 12/17/2018 0417   GFRAA 56 (L) 12/17/2018 0417   Recent Labs    12/14/18 0536 12/15/18 0911 12/16/18 0611 12/17/18 0417 12/27/18  NA 138 138 136 135 142  K 3.9 3.4* 4.0 4.1 3.6  CL 110 108 108 105  --  CO2 22 23 22 22   --   GLUCOSE 144* 85 96 104*  --   BUN 25* 25* 21 20 32*  CREATININE 1.13* 0.81 0.86 0.99 0.9  CALCIUM 8.3* 7.8* 7.8* 7.7*  --   MG 2.1 2.3 2.2  --   --    Recent Labs    08/01/18  AST 9*  ALT 7  ALKPHOS 58   Recent Labs    12/11/18 2308  12/16/18 0611 12/17/18 0417 12/18/18 0408 12/27/18  WBC 14.6*   < > 7.4 7.3 8.2 12.2  NEUTROABS 12.6*  --   --  4.7 5.3  --   HGB 11.0*   < > 7.3* 9.4* 9.5* 9.0*  HCT 36.4   < > 24.5* 31.3* 31.4* 28*  MCV 83.7   < > 83.3 83.2 83.3  --   PLT 311   < > 282 265 257 404*   < > = values in this interval not displayed.   Recent Labs    08/01/18  CHOL 140  LDLCALC 80  TRIG 82   No results found for: [facility] Lab Results  Component Value Date   TSH 1.71 08/01/2018   Lab Results  Component Value Date   HGBA1C 5.7 08/01/2018   Lab Results  Component Value Date   CHOL 140 08/01/2018   HDL 46 08/01/2018   LDLCALC 80 08/01/2018   TRIG 82 08/01/2018   CHOLHDL 2.4 03/03/2012    Significant Diagnostic Results in last 30 days:  No results found.  Assessment and Plan  CAD (coronary artery disease) No reported chest pain or equivalent; continue ASA 81 mg daily and PRN nitroglycerin sublingual; patient is on statin  HTN (hypertension) Controlled on current medications; continue Cozaar 50 mg daily, hydralazine 25 mg every 8 hours and Norvasc 10 mg daily  VTE (venous thromboembolism) Chronic and stable; continue Coumadin     Hennie Duos, MD

## 2019-05-12 ENCOUNTER — Encounter: Payer: Self-pay | Admitting: Internal Medicine

## 2019-05-12 NOTE — Assessment & Plan Note (Signed)
No reported chest pain or equivalent; continue ASA 81 mg daily and PRN nitroglycerin sublingual; patient is on statin

## 2019-05-12 NOTE — Progress Notes (Signed)
Location:   [facility]:   SNF  Hennie Duos, MD  Patient Care Team: Hennie Duos, MD as PCP - General (Internal Medicine)  Extended Emergency Contact Information Primary Emergency Contact: Teston,[name] Address: [address]          [place], [place] [address] [name] of [place] Phone: [phone] Mobile Phone: [phone] Relation: Son Secondary Emergency Contact: [place] of [place] Phone: [phone] Relation: Daughter    Allergies: Ambien [zolpidem tartrate], Codeine phosphate, Darvocet [propoxyphene n-acetaminophen], Fish-derived products, Promethazine hcl, Sulfamethoxazole, Vicodin [hydrocodone-acetaminophen], Amoxicillin, and Penicillins  Chief Complaint  Patient presents with  . Acute Visit    HPI: Patient is 85 y.o. female who is being seen for an INR check.  Her INR today is 2.2 on 2 mg daily; per nursing there are no problems.  Past Medical History:  Diagnosis Date  . Anemia 03/02/2012  . Angina pectoris, unstable ([place]) 03/02/2012  . Anxiety   . Axillary abscess   . Bradycardia 06/13/2012  . CAD (coronary artery disease)    S/p PTCA / stenting (last cath 2004, multiple LAD stents, 2 stents in the right coronary artery all patent)  . Cancer ([place])   . Complication of anesthesia   . Compression fracture of L2 ([place]) 03/12/2017  . Dementia ([place])   . Dysphagia 10/06/2012  . Dyspnea 02/03/2012  . GERD (gastroesophageal reflux disease)   . History of pulmonary embolus (PE) 03/19/2017  . HTN (hypertension)   . PMR (polymyalgia rheumatica) (HCC)   . PONV (postoperative nausea and vomiting)   . Restless leg syndrome   . 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 Started coumadin  . Weakness of both legs 06/09/2012    Past Surgical History:  Procedure Laterality Date  . BREAST LUMPECTOMY    . COLECTOMY    . ESOPHAGOGASTRODUODENOSCOPY (EGD) WITH ESOPHAGEAL DILATION  10/10/2012   Procedure: ESOPHAGOGASTRODUODENOSCOPY (EGD) WITH ESOPHAGEAL DILATION;  Surgeon: Inda Castle, MD;  Location: [facility];  Service: Endoscopy;  Laterality: N/A;  . FEMUR IM NAIL Left 12/13/2018   Procedure: INTRAMEDULLARY (IM) NAIL FEMORAL LEFT;  Surgeon: Paralee Cancel, MD;  Location: [facility];  Service: Orthopedics;  Laterality: Left;  . heart stents  x 8  . INCISION AND DRAINAGE     bilateral axillary, non specific staff  . INCISION AND DRAINAGE ABSCESS  09/28/2012   Procedure: INCISION AND DRAINAGE ABSCESS;  Surgeon: Zenovia Jarred, MD;  Location: [facility];  Service: General;  Laterality: Bilateral;  . stent     cardiac x 8 stents.    Allergies as of 05/08/2019      Reactions   Ambien [zolpidem Tartrate] Nausea And Vomiting   Codeine Phosphate Nausea And Vomiting   Darvocet [propoxyphene N-acetaminophen] Nausea And Vomiting   Fish-derived Products    Promethazine Hcl Nausea And Vomiting   Sulfamethoxazole Other (See Comments)   Pt doesn't remember reaction   Vicodin [hydrocodone-acetaminophen] Other (See Comments)   Unknown reaction   Amoxicillin Nausea And Vomiting, Rash   Penicillins Nausea And Vomiting, Rash      Medication List       Accurate as of May 08, 2019 11:59 PM. If you have any questions, ask your nurse or doctor.        acetaminophen 325 MG tablet Commonly known as: TYLENOL Take 650 mg by mouth every 6 (six) hours as needed for mild pain, moderate pain or fever.  acetaminophen 325 MG tablet Commonly known as: TYLENOL Take 650 mg by mouth every 6 (six) hours. Take routinely for left hip pain   ALPRAZolam 0.5 MG tablet Commonly known as: XANAX Take 1 tablet (0.5 mg total) by mouth daily.   amLODipine 10 MG tablet Commonly known as: NORVASC Take 10 mg by mouth every morning.   aspirin EC 81 MG tablet Take 1 tablet (81 mg total) by mouth daily.   atorvastatin 10 MG tablet Commonly known as: LIPITOR Take 10 mg by mouth every evening.    cholecalciferol 1000 units tablet Commonly known as: VITAMIN D Take 2,000 Units by mouth daily with lunch.   donepezil 10 MG tablet Commonly known as: ARICEPT Take 10 mg by mouth every evening.   losartan 50 MG tablet Commonly known as: COZAAR Take 50 mg by mouth at bedtime.   multivitamin with minerals tablet Take 1 tablet by mouth daily.   nitroGLYCERIN 0.4 MG SL tablet Commonly known as: NITROSTAT Place 0.4 mg under the tongue every 5 (five) minutes as needed for chest pain.   NON FORMULARY Diet Type:  Liberalized due to poor appetite to Mech soft   NUTRITIONAL SUPPLEMENT PO Take 1 each by mouth 2 (two) times a day. Magic Cup - take with lunch and dinner   ondansetron 4 MG tablet Commonly known as: ZOFRAN Take 4 mg by mouth every 4 (four) hours as needed for nausea.   oxybutynin 5 MG tablet Commonly known as: DITROPAN Take 5 mg by mouth 2 (two) times daily.   polyethylene glycol 17 g packet Commonly known as: MIRALAX / GLYCOLAX Take 17 g by mouth daily as needed for moderate constipation.   predniSONE 1 MG tablet Commonly known as: DELTASONE Take 1 mg by mouth daily with breakfast.   ProAir HFA 108 (90 Base) MCG/ACT inhaler Generic drug: albuterol Inhale 2 puffs into the lungs every 6 (six) hours as needed for wheezing or shortness of breath.   rOPINIRole 1 MG tablet Commonly known as: REQUIP Take 1 mg by mouth See admin instructions. 1mg  at noon, 1mg  2 hours before bed, then 1mg  at bedtime   senna-docusate 8.6-50 MG tablet Commonly known as: Senokot-S Take 1 tablet by mouth 2 (two) times daily.   Silenor 6 MG Tabs Generic drug: Doxepin HCl Take 0.5 tablets (3 mg total) by mouth at bedtime.   traMADol 50 MG tablet Commonly known as: ULTRAM Take 1 tablet (50 mg total) by mouth 2 (two) times daily.   Tums 500 MG chewable tablet Generic drug: calcium carbonate Chew 1 tablet by mouth 3 (three) times daily as needed for indigestion or heartburn.    warfarin 2 MG tablet Commonly known as: COUMADIN Take 2 mg by mouth daily.       No orders of the defined types were placed in this encounter.   Immunization History  Administered Date(s) Administered  . Influenza-Unspecified 08/01/2016, 08/11/2017, 07/02/2018  . PPD Test 03/18/2017  . Pneumococcal Conjugate-13 09/17/2017    Social History   Tobacco Use  . Smoking status: Never Smoker  . Smokeless tobacco: Never Used  Substance Use Topics  . Alcohol use: No     Vitals:   05/12/19 2222  BP: 122/69  Pulse: (!) 57  Resp: 19  Temp: 98.2 F (36.8 C)   There is no height or weight on file to calculate BMI.   Patient Active Problem List   Diagnosis Date Noted  . Failure to thrive in adult 01/08/2019  .  Moderate protein malnutrition ([place]) 01/08/2019  . Pressure injury of skin 12/18/2018  . Intertrochanteric fracture of left femur ([place]) 12/11/2018  . CKD (chronic kidney disease), stage III ([place]) 12/11/2018  . Depression with anxiety 12/11/2018  . Overactive bladder 09/24/2017  . Insomnia 07/31/2017  . Anxiety 06/16/2017  . Citrobacter infection 03/19/2017  . AKI (acute kidney injury) ([place]) 03/19/2017  . History of pulmonary embolus (PE) 03/19/2017  . Elevated INR 03/19/2017  . Candidal intertrigo 03/19/2017  . Compression fracture of L2 ([place]) 03/12/2017  . Acute lower UTI 03/12/2017  . Accelerated hypertension 03/12/2017  . Leukocytosis 07/31/2015  . Acute bronchitis 07/28/2015  . Chronic anticoagulation 07/28/2015  . Intractable back pain 07/20/2015  . Back pain 07/20/2015  . Cellulitis and abscess of left buttock 04/12/2013  . Stricture and stenosis of esophagus 10/10/2012  . Dementia ([place]) 10/06/2012  . Dysphagia 10/06/2012  . Axillary abscess - bilateral, multiple 09/27/2012  . Hypotension 08/24/2012  . Near syncope 08/24/2012  . Abscess of axilla, left 06/27/2012  . Bradycardia 06/13/2012  . Abscess 06/11/2012  . Chest pain 06/09/2012  .  Cellulitis 06/09/2012  . Weakness of both legs 06/09/2012  . PMR (polymyalgia rheumatica) (HCC)   . Restless leg syndrome   . Angina pectoris, unstable ([place]) 03/02/2012  . Acute blood loss as cause of postoperative anemia 03/02/2012  . Dyspnea 02/03/2012  . UTI (urinary tract infection) 02/03/2012  . CAD (coronary artery disease)   . HTN (hypertension)   . VTE (venous thromboembolism) 10/01/2010  . HYPERCHOLESTEROLEMIA  IIA 08/27/2009  . Hyperlipidemia 12/11/2008  . HYPERTENSION, BENIGN 12/11/2008    CMP     Component Value Date/Time   NA 142 12/27/2018   K 3.6 12/27/2018   CL 105 12/17/2018 0417   CO2 22 12/17/2018 0417   GLUCOSE 104 (H) 12/17/2018 0417   BUN 32 (A) 12/27/2018   CREATININE 0.9 12/27/2018   CREATININE 0.99 12/17/2018 0417   CALCIUM 7.7 (L) 12/17/2018 0417   PROT 6.7 03/11/2017 2031   ALBUMIN 3.4 (L) 03/11/2017 2031   AST 9 (A) 08/01/2018   ALT 7 08/01/2018   ALKPHOS 58 08/01/2018   BILITOT 0.5 03/11/2017 2031   GFRNONAA 48 (L) 12/17/2018 0417   GFRAA 56 (L) 12/17/2018 0417   Recent Labs    12/14/18 0536 12/15/18 0911 12/16/18 0611 12/17/18 0417 12/27/18  NA 138 138 136 135 142  K 3.9 3.4* 4.0 4.1 3.6  CL 110 108 108 105  --   CO2 22 23 22 22   --   GLUCOSE 144* 85 96 104*  --   BUN 25* 25* 21 20 32*  CREATININE 1.13* 0.81 0.86 0.99 0.9  CALCIUM 8.3* 7.8* 7.8* 7.7*  --   MG 2.1 2.3 2.2  --   --    Recent Labs    08/01/18  AST 9*  ALT 7  ALKPHOS 58   Recent Labs    12/11/18 2308  12/16/18 0611 12/17/18 0417 12/18/18 0408 12/27/18  WBC 14.6*   < > 7.4 7.3 8.2 12.2  NEUTROABS 12.6*  --   --  4.7 5.3  --   HGB 11.0*   < > 7.3* 9.4* 9.5* 9.0*  HCT 36.4   < > 24.5* 31.3* 31.4* 28*  MCV 83.7   < > 83.3 83.2 83.3  --   PLT 311   < > 282 265 257 404*   < > = values in this interval not displayed.   Recent Labs  08/01/18  CHOL 140  LDLCALC 80  TRIG 82   No results found for: [facility] Lab Results  Component Value Date   TSH  1.71 08/01/2018   Lab Results  Component Value Date   HGBA1C 5.7 08/01/2018   Lab Results  Component Value Date   CHOL 140 08/01/2018   HDL 46 08/01/2018   LDLCALC 80 08/01/2018   TRIG 82 08/01/2018   CHOLHDL 2.4 03/03/2012    Significant Diagnostic Results in last 30 days:  No results found.  Assessment and Plan  History of PE/chronic anticoagulation- patient is is on Coumadin for history of PE and DVT; her INR today is 2.2 and she is on 2 mg daily and will continue 2 mg daily; next INR check 1 week     Labs/tests ordered: [facility] living by [place] lability   Hennie Duos , MD

## 2019-05-12 NOTE — Assessment & Plan Note (Signed)
Chronic and stable; continue Coumadin

## 2019-05-12 NOTE — Assessment & Plan Note (Signed)
Controlled on current medications; continue Cozaar 50 mg daily, hydralazine 25 mg every 8 hours and Norvasc 10 mg daily

## 2019-05-15 ENCOUNTER — Non-Acute Institutional Stay (SKILLED_NURSING_FACILITY): Payer: Medicare Other | Admitting: Internal Medicine

## 2019-05-15 DIAGNOSIS — Z7901 Long term (current) use of anticoagulants: Secondary | ICD-10-CM

## 2019-05-15 DIAGNOSIS — I8290 Acute embolism and thrombosis of unspecified vein: Secondary | ICD-10-CM | POA: Diagnosis not present

## 2019-05-17 DIAGNOSIS — S72142D Displaced intertrochanteric fracture of left femur, subsequent encounter for closed fracture with routine healing: Secondary | ICD-10-CM | POA: Diagnosis not present

## 2019-05-17 DIAGNOSIS — R2681 Unsteadiness on feet: Secondary | ICD-10-CM | POA: Diagnosis not present

## 2019-05-17 DIAGNOSIS — M6281 Muscle weakness (generalized): Secondary | ICD-10-CM | POA: Diagnosis not present

## 2019-05-18 DIAGNOSIS — M6281 Muscle weakness (generalized): Secondary | ICD-10-CM | POA: Diagnosis not present

## 2019-05-18 DIAGNOSIS — S72142D Displaced intertrochanteric fracture of left femur, subsequent encounter for closed fracture with routine healing: Secondary | ICD-10-CM | POA: Diagnosis not present

## 2019-05-18 DIAGNOSIS — R2681 Unsteadiness on feet: Secondary | ICD-10-CM | POA: Diagnosis not present

## 2019-05-19 ENCOUNTER — Encounter: Payer: Self-pay | Admitting: Internal Medicine

## 2019-05-19 NOTE — Progress Notes (Signed)
Location:  [facility]:   SNF  Hennie Duos, MD  Patient Care Team: Hennie Duos, MD as PCP - General (Internal Medicine)  Extended Emergency Contact Information Primary Emergency Contact: Crompton,[name] Address: [address]          [place], [place] [address] [name] of [place] Phone: [phone] Mobile Phone: [phone] Relation: Son Secondary Emergency Contact: [place] of [place] Phone: [phone] Relation: Daughter    Allergies: Ambien [zolpidem tartrate], Codeine phosphate, Darvocet [propoxyphene n-acetaminophen], Fish-derived products, Promethazine hcl, Sulfamethoxazole, Vicodin [hydrocodone-acetaminophen], Amoxicillin, and Penicillins  Chief Complaint  Patient presents with  . Acute Visit    HPI: Patient is 85 y.o. female who is being seen for INR check.  Patient's INR is 2.0 today on 2 mg daily.  Past Medical History:  Diagnosis Date  . Anemia 03/02/2012  . Angina pectoris, unstable ([place]) 03/02/2012  . Anxiety   . Axillary abscess   . Bradycardia 06/13/2012  . CAD (coronary artery disease)    S/p PTCA / stenting (last cath 2004, multiple LAD stents, 2 stents in the right coronary artery all patent)  . Cancer ([place])   . Complication of anesthesia   . Compression fracture of L2 ([place]) 03/12/2017  . Dementia ([place])   . Dysphagia 10/06/2012  . Dyspnea 02/03/2012  . GERD (gastroesophageal reflux disease)   . History of pulmonary embolus (PE) 03/19/2017  . HTN (hypertension)   . PMR (polymyalgia rheumatica) (HCC)   . PONV (postoperative nausea and vomiting)   . Restless leg syndrome   . 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 Started coumadin  . Weakness of both legs 06/09/2012    Past Surgical History:  Procedure Laterality Date  . BREAST LUMPECTOMY    . COLECTOMY    . ESOPHAGOGASTRODUODENOSCOPY (EGD) WITH ESOPHAGEAL DILATION  10/10/2012   Procedure:  ESOPHAGOGASTRODUODENOSCOPY (EGD) WITH ESOPHAGEAL DILATION;  Surgeon: Inda Castle, MD;  Location: [facility];  Service: Endoscopy;  Laterality: N/A;  . FEMUR IM NAIL Left 12/13/2018   Procedure: INTRAMEDULLARY (IM) NAIL FEMORAL LEFT;  Surgeon: Paralee Cancel, MD;  [facility];  Service: Orthopedics;  Laterality: Left;  . heart stents  x 8  . INCISION AND DRAINAGE     bilateral axillary, non specific staff  . INCISION AND DRAINAGE ABSCESS  09/28/2012   Procedure: INCISION AND DRAINAGE ABSCESS;  Surgeon: Zenovia Jarred, MD;  Location: [facility];  Service: General;  Laterality: Bilateral;  . stent     cardiac x 8 stents.    Allergies as of 05/15/2019      Reactions   Ambien [zolpidem Tartrate] Nausea And Vomiting   Codeine Phosphate Nausea And Vomiting   Darvocet [propoxyphene N-acetaminophen] Nausea And Vomiting   Fish-derived Products    Promethazine Hcl Nausea And Vomiting   Sulfamethoxazole Other (See Comments)   Pt doesn't remember reaction   Vicodin [hydrocodone-acetaminophen] Other (See Comments)   Unknown reaction   Amoxicillin Nausea And Vomiting, Rash   Penicillins Nausea And Vomiting, Rash      Medication List       Accurate as of May 15, 2019 11:59 PM. If you have any questions, ask your nurse or doctor.        acetaminophen 325 MG tablet Commonly known as: TYLENOL Take 650 mg by mouth every 6 (six) hours as needed for mild pain, moderate pain or fever.   acetaminophen 325  MG tablet Commonly known as: TYLENOL Take 650 mg by mouth every 6 (six) hours. Take routinely for left hip pain   ALPRAZolam 0.5 MG tablet Commonly known as: XANAX Take 1 tablet (0.5 mg total) by mouth daily.   amLODipine 10 MG tablet Commonly known as: NORVASC Take 10 mg by mouth every morning.   aspirin EC 81 MG tablet Take 1 tablet (81 mg total) by mouth daily.   atorvastatin 10 MG tablet Commonly known as: LIPITOR Take 10 mg by mouth every evening.   cholecalciferol 1000  units tablet Commonly known as: VITAMIN D Take 2,000 Units by mouth daily with lunch.   donepezil 10 MG tablet Commonly known as: ARICEPT Take 10 mg by mouth every evening.   losartan 50 MG tablet Commonly known as: COZAAR Take 50 mg by mouth at bedtime.   multivitamin with minerals tablet Take 1 tablet by mouth daily.   nitroGLYCERIN 0.4 MG SL tablet Commonly known as: NITROSTAT Place 0.4 mg under the tongue every 5 (five) minutes as needed for chest pain.   NON FORMULARY Diet Type:  Liberalized due to poor appetite to Mech soft   NUTRITIONAL SUPPLEMENT PO Take 1 each by mouth 2 (two) times a day. Magic Cup - take with lunch and dinner   ondansetron 4 MG tablet Commonly known as: ZOFRAN Take 4 mg by mouth every 4 (four) hours as needed for nausea.   oxybutynin 5 MG tablet Commonly known as: DITROPAN Take 5 mg by mouth 2 (two) times daily.   polyethylene glycol 17 g packet Commonly known as: MIRALAX / GLYCOLAX Take 17 g by mouth daily as needed for moderate constipation.   predniSONE 1 MG tablet Commonly known as: DELTASONE Take 1 mg by mouth daily with breakfast.   ProAir HFA 108 (90 Base) MCG/ACT inhaler Generic drug: albuterol Inhale 2 puffs into the lungs every 6 (six) hours as needed for wheezing or shortness of breath.   rOPINIRole 1 MG tablet Commonly known as: REQUIP Take 1 mg by mouth See admin instructions. 1mg  at noon, 1mg  2 hours before bed, then 1mg  at bedtime   senna-docusate 8.6-50 MG tablet Commonly known as: Senokot-S Take 1 tablet by mouth 2 (two) times daily.   Silenor 6 MG Tabs Generic drug: Doxepin HCl Take 0.5 tablets (3 mg total) by mouth at bedtime.   traMADol 50 MG tablet Commonly known as: ULTRAM Take 1 tablet (50 mg total) by mouth 2 (two) times daily.   Tums 500 MG chewable tablet Generic drug: calcium carbonate Chew 1 tablet by mouth 3 (three) times daily as needed for indigestion or heartburn.   warfarin 2 MG tablet  Commonly known as: COUMADIN Take 2 mg by mouth daily.       No orders of the defined types were placed in this encounter.   Immunization History  Administered Date(s) Administered  . Influenza-Unspecified 08/01/2016, 08/11/2017, 07/02/2018  . PPD Test 03/18/2017  . Pneumococcal Conjugate-13 09/17/2017    Social History   Tobacco Use  . Smoking status: Never Smoker  . Smokeless tobacco: Never Used  Substance Use Topics  . Alcohol use: No     Vitals:   05/19/19 1706  BP: (!) 160/70  Pulse: 68  Resp: 17  Temp: (!) 96.6 F (35.9 C)   There is no height or weight on file to calculate BMI.   Patient Active Problem List   Diagnosis Date Noted  . Failure to thrive in adult 01/08/2019  . Moderate  protein malnutrition ([place]) 01/08/2019  . Pressure injury of skin 12/18/2018  . Intertrochanteric fracture of left femur ([place]) 12/11/2018  . CKD (chronic kidney disease), stage III ([place]) 12/11/2018  . Depression with anxiety 12/11/2018  . Overactive bladder 09/24/2017  . Insomnia 07/31/2017  . Anxiety 06/16/2017  . Citrobacter infection 03/19/2017  . AKI (acute kidney injury) ([place]) 03/19/2017  . History of pulmonary embolus (PE) 03/19/2017  . Elevated INR 03/19/2017  . Candidal intertrigo 03/19/2017  . Compression fracture of L2 ([place]) 03/12/2017  . Acute lower UTI 03/12/2017  . Accelerated hypertension 03/12/2017  . Leukocytosis 07/31/2015  . Acute bronchitis 07/28/2015  . Chronic anticoagulation 07/28/2015  . Intractable back pain 07/20/2015  . Back pain 07/20/2015  . Cellulitis and abscess of left buttock 04/12/2013  . Stricture and stenosis of esophagus 10/10/2012  . Dementia ([place]) 10/06/2012  . Dysphagia 10/06/2012  . Axillary abscess - bilateral, multiple 09/27/2012  . Hypotension 08/24/2012  . Near syncope 08/24/2012  . Abscess of axilla, left 06/27/2012  . Bradycardia 06/13/2012  . Abscess 06/11/2012  . Chest pain 06/09/2012  . Cellulitis 06/09/2012  .  Weakness of both legs 06/09/2012  . PMR (polymyalgia rheumatica) (HCC)   . Restless leg syndrome   . Angina pectoris, unstable ([place]) 03/02/2012  . Acute blood loss as cause of postoperative anemia 03/02/2012  . Dyspnea 02/03/2012  . UTI (urinary tract infection) 02/03/2012  . CAD (coronary artery disease)   . HTN (hypertension)   . VTE (venous thromboembolism) 10/01/2010  . HYPERCHOLESTEROLEMIA  IIA 08/27/2009  . Hyperlipidemia 12/11/2008  . HYPERTENSION, BENIGN 12/11/2008    CMP     Component Value Date/Time   NA 142 12/27/2018   K 3.6 12/27/2018   CL 105 12/17/2018 0417   CO2 22 12/17/2018 0417   GLUCOSE 104 (H) 12/17/2018 0417   BUN 32 (A) 12/27/2018   CREATININE 0.9 12/27/2018   CREATININE 0.99 12/17/2018 0417   CALCIUM 7.7 (L) 12/17/2018 0417   PROT 6.7 03/11/2017 2031   ALBUMIN 3.4 (L) 03/11/2017 2031   AST 9 (A) 08/01/2018   ALT 7 08/01/2018   ALKPHOS 58 08/01/2018   BILITOT 0.5 03/11/2017 2031   GFRNONAA 48 (L) 12/17/2018 0417   GFRAA 56 (L) 12/17/2018 0417   Recent Labs    12/14/18 0536 12/15/18 0911 12/16/18 0611 12/17/18 0417 12/27/18  NA 138 138 136 135 142  K 3.9 3.4* 4.0 4.1 3.6  CL 110 108 108 105  --   CO2 22 23 22 22   --   GLUCOSE 144* 85 96 104*  --   BUN 25* 25* 21 20 32*  CREATININE 1.13* 0.81 0.86 0.99 0.9  CALCIUM 8.3* 7.8* 7.8* 7.7*  --   MG 2.1 2.3 2.2  --   --    Recent Labs    08/01/18  AST 9*  ALT 7  ALKPHOS 58   Recent Labs    12/11/18 2308  12/16/18 0611 12/17/18 0417 12/18/18 0408 12/27/18  WBC 14.6*   < > 7.4 7.3 8.2 12.2  NEUTROABS 12.6*  --   --  4.7 5.3  --   HGB 11.0*   < > 7.3* 9.4* 9.5* 9.0*  HCT 36.4   < > 24.5* 31.3* 31.4* 28*  MCV 83.7   < > 83.3 83.2 83.3  --   PLT 311   < > 282 265 257 404*   < > = values in this interval not displayed.   Recent Labs  08/01/18  CHOL 140  LDLCALC 80  TRIG 82   No results found for: [facility] Lab Results  Component Value Date   TSH 1.71 08/01/2018   Lab  Results  Component Value Date   HGBA1C 5.7 08/01/2018   Lab Results  Component Value Date   CHOL 140 08/01/2018   HDL 46 08/01/2018   LDLCALC 80 08/01/2018   TRIG 82 08/01/2018   CHOLHDL 2.4 03/03/2012    Significant Diagnostic Results in last 30 days:  No results found.  Assessment and Plan  Chronic venous thromboembolism/chronic anticoagulation- INR today is 2.0 on 2 mg of Coumadin daily; will continue 2 mg of Coumadin daily; patient is 89 there could be a case made for her to start Coumadin but she is so stable and has had no problems so we will continue Coumadin      Hennie Duos , MD

## 2019-05-21 DIAGNOSIS — R2681 Unsteadiness on feet: Secondary | ICD-10-CM | POA: Diagnosis not present

## 2019-05-21 DIAGNOSIS — M6281 Muscle weakness (generalized): Secondary | ICD-10-CM | POA: Diagnosis not present

## 2019-05-21 DIAGNOSIS — S72142D Displaced intertrochanteric fracture of left femur, subsequent encounter for closed fracture with routine healing: Secondary | ICD-10-CM | POA: Diagnosis not present

## 2019-05-22 ENCOUNTER — Encounter: Payer: Self-pay | Admitting: Internal Medicine

## 2019-05-22 ENCOUNTER — Non-Acute Institutional Stay (SKILLED_NURSING_FACILITY): Payer: Medicare Other | Admitting: Internal Medicine

## 2019-05-22 DIAGNOSIS — Z86711 Personal history of pulmonary embolism: Secondary | ICD-10-CM

## 2019-05-22 DIAGNOSIS — R2681 Unsteadiness on feet: Secondary | ICD-10-CM | POA: Diagnosis not present

## 2019-05-22 DIAGNOSIS — M6281 Muscle weakness (generalized): Secondary | ICD-10-CM | POA: Diagnosis not present

## 2019-05-22 DIAGNOSIS — R627 Adult failure to thrive: Secondary | ICD-10-CM

## 2019-05-22 DIAGNOSIS — S72142D Displaced intertrochanteric fracture of left femur, subsequent encounter for closed fracture with routine healing: Secondary | ICD-10-CM | POA: Diagnosis not present

## 2019-05-22 DIAGNOSIS — Z7901 Long term (current) use of anticoagulants: Secondary | ICD-10-CM

## 2019-05-22 MED ORDER — ALPRAZOLAM 0.5 MG PO TABS: 0.5000 mg | ORAL_TABLET | Freq: Every day | ORAL | 0 refills | Status: DC

## 2019-05-22 NOTE — Progress Notes (Signed)
Location:  [facility] Room Number: 204-D Place of Service:  SNF (31)  Hennie Duos, MD  Patient Care Team: Hennie Duos, MD as PCP - General (Internal Medicine)  Extended Emergency Contact Information Primary Emergency Contact: Dimario,[name] Address: [address]          [place], [place] [address] [name] of [place] Phone: [phone] Mobile Phone: [phone] Relation: Son Secondary Emergency Contact: [place] of [place] Phone: [phone] Relation: Daughter    Allergies: Ambien [zolpidem tartrate], Codeine, Codeine phosphate, Darvocet [propoxyphene n-acetaminophen], Fish allergy, Fish-derived products, Hydrocodone, Promethazine, Promethazine hcl, Sulfamethoxazole, Vicodin [hydrocodone-acetaminophen], Amoxicillin, and Penicillins  Chief Complaint  Patient presents with  . Acute Visit    INR    HPI: Patient is a 85 y.o. female who   Past Medical History:  Diagnosis Date  . Anemia 03/02/2012  . Angina pectoris, unstable ([place]) 03/02/2012  . Anxiety   . Axillary abscess   . Bradycardia 06/13/2012  . CAD (coronary artery disease)    S/p PTCA / stenting (last cath 2004, multiple LAD stents, 2 stents in the right coronary artery all patent)  . Cancer ([place])   . Complication of anesthesia   . Compression fracture of L2 ([place]) 03/12/2017  . Dementia ([place])   . Dysphagia 10/06/2012  . Dyspnea 02/03/2012  . GERD (gastroesophageal reflux disease)   . History of pulmonary embolus (PE) 03/19/2017  . HTN (hypertension)   . PMR (polymyalgia rheumatica) (HCC)   . PONV (postoperative nausea and vomiting)   . Restless leg syndrome   . 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 Started coumadin  . Weakness of both legs 06/09/2012    Past Surgical History:  Procedure Laterality Date  . BREAST LUMPECTOMY    . COLECTOMY    . ESOPHAGOGASTRODUODENOSCOPY (EGD) WITH ESOPHAGEAL DILATION  10/10/2012   Procedure: ESOPHAGOGASTRODUODENOSCOPY (EGD) WITH ESOPHAGEAL DILATION;  Surgeon: Inda Castle, MD;  Location: [facility];  Service: Endoscopy;  Laterality: N/A;  . FEMUR IM NAIL Left 12/13/2018   Procedure: INTRAMEDULLARY (IM) NAIL FEMORAL LEFT;  Surgeon: Paralee Cancel, MD;  Location: [facility];  Service: Orthopedics;  Laterality: Left;  . heart stents  x 8  . INCISION AND DRAINAGE     bilateral axillary, non specific staff  . INCISION AND DRAINAGE ABSCESS  09/28/2012   Procedure: INCISION AND DRAINAGE ABSCESS;  Surgeon: Zenovia Jarred, MD;  Location: [facility];  Service: General;  Laterality: Bilateral;  . stent     cardiac x 8 stents.    Allergies as of 05/22/2019      Reactions   Ambien [zolpidem Tartrate] Nausea And Vomiting   Codeine    Codeine Phosphate Nausea And Vomiting   Darvocet [propoxyphene N-acetaminophen] Nausea And Vomiting   Fish Allergy    Fish-derived Products    Hydrocodone    Promethazine    Promethazine Hcl Nausea And Vomiting   Sulfamethoxazole Other (See Comments)   Pt doesn't remember reaction   Vicodin [hydrocodone-acetaminophen] Other (See Comments)   Unknown reaction   Amoxicillin Nausea And Vomiting, Rash   Penicillins Nausea And Vomiting, Rash      Medication List       Accurate as of May 22, 2019  3:30 PM. If you have any questions, ask your nurse or doctor.        acetaminophen 325 MG tablet Commonly known as: TYLENOL Take 650 mg by mouth every 6 (six)  hours as needed for mild pain, moderate pain or fever.   acetaminophen 325 MG tablet Commonly known as: TYLENOL Take 650 mg by mouth every 6 (six) hours. Take routinely for left hip pain   ALPRAZolam 0.5 MG tablet Commonly known as: XANAX Take 1 tablet (0.5 mg total) by mouth daily.   amLODipine 10 MG tablet Commonly known as: NORVASC Take 10 mg by mouth every morning.   aspirin EC 81 MG tablet Take 1 tablet (81 mg total) by mouth daily.   atorvastatin 10 MG tablet Commonly known  as: LIPITOR Take 10 mg by mouth every evening.   donepezil 10 MG tablet Commonly known as: ARICEPT Take 10 mg by mouth every evening.   losartan 50 MG tablet Commonly known as: COZAAR Take 50 mg by mouth at bedtime.   multivitamin with minerals tablet Take 1 tablet by mouth daily.   nitroGLYCERIN 0.4 MG SL tablet Commonly known as: NITROSTAT Place 0.4 mg under the tongue every 5 (five) minutes as needed for chest pain.   NON FORMULARY Diet Type:  Liberalized due to poor appetite to Mech soft   NUTRITIONAL SUPPLEMENT PO Take 1 each by mouth 2 (two) times a day. Magic Cup - take with lunch and dinner   ondansetron 4 MG tablet Commonly known as: ZOFRAN Take 4 mg by mouth every 4 (four) hours as needed for nausea.   oxybutynin 5 MG tablet Commonly known as: DITROPAN Take 5 mg by mouth 2 (two) times daily.   polyethylene glycol 17 g packet Commonly known as: MIRALAX / GLYCOLAX Take 17 g by mouth daily as needed for moderate constipation.   predniSONE 1 MG tablet Commonly known as: DELTASONE Take 1 mg by mouth daily with breakfast.   ProAir HFA 108 (90 Base) MCG/ACT inhaler Generic drug: albuterol Inhale 2 puffs into the lungs every 6 (six) hours as needed for wheezing or shortness of breath.   rOPINIRole 1 MG tablet Commonly known as: REQUIP Take 1 mg by mouth See admin instructions. 1mg  at noon, 1mg  2 hours before bed, then 1mg  at bedtime   senna-docusate 8.6-50 MG tablet Commonly known as: Senokot-S Take 1 tablet by mouth 2 (two) times daily.   Silenor 6 MG Tabs Generic drug: Doxepin HCl Take 0.5 tablets (3 mg total) by mouth at bedtime.   traMADol 50 MG tablet Commonly known as: ULTRAM Take 1 tablet (50 mg total) by mouth 2 (two) times daily.   Tums 500 MG chewable tablet Generic drug: calcium carbonate Chew 1 tablet by mouth 3 (three) times daily as needed for indigestion or heartburn.   Vitamin D3 1.25 MG (50000 UT) Caps Take 1 capsule by mouth once a  week. What changed: Another medication with the same name was removed. Continue taking this medication, and follow the directions you see here. Changed by: Inocencio Homes, MD   warfarin 2 MG tablet Commonly known as: COUMADIN Take 2 mg by mouth daily.       No orders of the defined types were placed in this encounter.   Immunization History  Administered Date(s) Administered  . Influenza-Unspecified 08/01/2016, 08/11/2017, 07/02/2018  . PPD Test 03/18/2017  . Pneumococcal Conjugate-13 09/17/2017    Social History   Tobacco Use  . Smoking status: Never Smoker  . Smokeless tobacco: Never Used  Substance Use Topics  . Alcohol use: No     Vitals:   05/22/19 1509 05/22/19 1512  BP: (!) 148/62 (!) 146/64  Pulse: (!) 58 (!) 53  Resp: 17   Temp: (!) 96.9 F (36.1 C)    Body mass index is 23.73 kg/m.   Patient Active Problem List   Diagnosis Date Noted  . Failure to thrive in adult 01/08/2019  . Moderate protein malnutrition ([place]) 01/08/2019  . Pressure injury of skin 12/18/2018  . Intertrochanteric fracture of left femur ([employer]) 12/11/2018  . CKD (chronic kidney disease), stage III ([place]) 12/11/2018  . Depression with anxiety 12/11/2018  . Overactive bladder 09/24/2017  . Insomnia 07/31/2017  . Anxiety 06/16/2017  . Citrobacter infection 03/19/2017  . AKI (acute kidney injury) ([place]) 03/19/2017  . History of pulmonary embolus (PE) 03/19/2017  . Elevated INR 03/19/2017  . Candidal intertrigo 03/19/2017  . Compression fracture of L2 ([place]) 03/12/2017  . Acute lower UTI 03/12/2017  . Accelerated hypertension 03/12/2017  . Leukocytosis 07/31/2015  . Acute bronchitis 07/28/2015  . Chronic anticoagulation 07/28/2015  . Intractable back pain 07/20/2015  . Back pain 07/20/2015  . Cellulitis and abscess of left buttock 04/12/2013  . Stricture and stenosis of esophagus 10/10/2012  . Dementia ([place]) 10/06/2012  . Dysphagia 10/06/2012  . Axillary abscess - bilateral,  multiple 09/27/2012  . Hypotension 08/24/2012  . Near syncope 08/24/2012  . Abscess of axilla, left 06/27/2012  . Bradycardia 06/13/2012  . Abscess 06/11/2012  . Chest pain 06/09/2012  . Cellulitis 06/09/2012  . Weakness of both legs 06/09/2012  . PMR (polymyalgia rheumatica) (HCC)   . Restless leg syndrome   . Angina pectoris, unstable ([place]) 03/02/2012  . Acute blood loss as cause of postoperative anemia 03/02/2012  . Dyspnea 02/03/2012  . UTI (urinary tract infection) 02/03/2012  . CAD (coronary artery disease)   . HTN (hypertension)   . VTE (venous thromboembolism) 10/01/2010  . HYPERCHOLESTEROLEMIA  IIA 08/27/2009  . Hyperlipidemia 12/11/2008  . HYPERTENSION, BENIGN 12/11/2008    CMP     Component Value Date/Time   NA 142 12/27/2018   K 3.6 12/27/2018   CL 105 12/17/2018 0417   CO2 22 12/17/2018 0417   GLUCOSE 104 (H) 12/17/2018 0417   BUN 32 (A) 12/27/2018   CREATININE 0.9 12/27/2018   CREATININE 0.99 12/17/2018 0417   CALCIUM 7.7 (L) 12/17/2018 0417   PROT 6.7 03/11/2017 2031   ALBUMIN 3.4 (L) 03/11/2017 2031   AST 9 (A) 08/01/2018   ALT 7 08/01/2018   ALKPHOS 58 08/01/2018   BILITOT 0.5 03/11/2017 2031   GFRNONAA 48 (L) 12/17/2018 0417   GFRAA 56 (L) 12/17/2018 0417   Recent Labs    12/14/18 0536 12/15/18 0911 12/16/18 0611 12/17/18 0417 12/27/18  NA 138 138 136 135 142  K 3.9 3.4* 4.0 4.1 3.6  CL 110 108 108 105  --   CO2 22 23 22 22   --   GLUCOSE 144* 85 96 104*  --   BUN 25* 25* 21 20 32*  CREATININE 1.13* 0.81 0.86 0.99 0.9  CALCIUM 8.3* 7.8* 7.8* 7.7*  --   MG 2.1 2.3 2.2  --   --    Recent Labs    08/01/18  AST 9*  ALT 7  ALKPHOS 58   Recent Labs    12/11/18 2308  12/16/18 0611 12/17/18 0417 12/18/18 0408 12/27/18  WBC 14.6*   < > 7.4 7.3 8.2 12.2  NEUTROABS 12.6*  --   --  4.7 5.3  --   HGB 11.0*   < > 7.3* 9.4* 9.5* 9.0*  HCT 36.4   < > 24.5* 31.3* 31.4* 28*  MCV 83.7   < > 83.3 83.2 83.3  --   PLT 311   < > 282 265 257 404*    < > = values in this interval not displayed.   Recent Labs    08/01/18  CHOL 140  LDLCALC 80  TRIG 82   No results found for: [facility] Lab Results  Component Value Date   TSH 1.71 08/01/2018   Lab Results  Component Value Date   HGBA1C 5.7 08/01/2018   Lab Results  Component Value Date   CHOL 140 08/01/2018   HDL 46 08/01/2018   LDLCALC 80 08/01/2018   TRIG 82 08/01/2018   CHOLHDL 2.4 03/03/2012    Significant Diagnostic Results in last 30 days:  No results found.  Assessment and Plan  No problem-specific Assessment & Plan notes found for this encounter.   Labs/tests ordered:    Hennie Duos , MD

## 2019-05-22 NOTE — Progress Notes (Signed)
Location:  [facility] Room Number: 204-D Place of Service:  SNF (31)  Hennie Duos, MD  Patient Care Team: Hennie Duos, MD as PCP - General (Internal Medicine)  Extended Emergency Contact Information Primary Emergency Contact: Vana,[name] Address: [address]          [place], [place] [address] [name] of [place] Phone: [phone] Mobile Phone: [phone] Relation: Son Secondary Emergency Contact: [place] of [place] Phone: [phone] Relation: Daughter    Allergies: Ambien [zolpidem tartrate], Codeine, Codeine phosphate, Darvocet [propoxyphene n-acetaminophen], Fish allergy, Fish-derived products, Hydrocodone, Promethazine, Promethazine hcl, Sulfamethoxazole, Vicodin [hydrocodone-acetaminophen], Amoxicillin, and Penicillins  Chief Complaint  Patient presents with  . Acute Visit    INR    HPI: Patient is 85 y.o. female with history of chronic PE who is being seen for INR check.  INR is 2.8 on 2 mg daily.  Patient has no complaints.  Past Medical History:  Diagnosis Date  . Anemia 03/02/2012  . Angina pectoris, unstable ([employer]) 03/02/2012  . Anxiety   . Axillary abscess   . Bradycardia 06/13/2012  . CAD (coronary artery disease)    S/p PTCA / stenting (last cath 2004, multiple LAD stents, 2 stents in the right coronary artery all patent)  . Cancer ([place])   . Complication of anesthesia   . Compression fracture of L2 ([place]) 03/12/2017  . Dementia ([place])   . Dysphagia 10/06/2012  . Dyspnea 02/03/2012  . GERD (gastroesophageal reflux disease)   . History of pulmonary embolus (PE) 03/19/2017  . HTN (hypertension)   . PMR (polymyalgia rheumatica) (HCC)   . PONV (postoperative nausea and vomiting)   . Restless leg syndrome   . 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 Started coumadin  . Weakness of both legs 06/09/2012    Past Surgical History:  Procedure Laterality Date  .  BREAST LUMPECTOMY    . COLECTOMY    . ESOPHAGOGASTRODUODENOSCOPY (EGD) WITH ESOPHAGEAL DILATION  10/10/2012   Procedure: ESOPHAGOGASTRODUODENOSCOPY (EGD) WITH ESOPHAGEAL DILATION;  Surgeon: Inda Castle, MD;  Location: [facility];  Service: Endoscopy;  Laterality: N/A;  . FEMUR IM NAIL Left 12/13/2018   Procedure: INTRAMEDULLARY (IM) NAIL FEMORAL LEFT;  Surgeon: Paralee Cancel, MD;  [facility];  Service: Orthopedics;  Laterality: Left;  . heart stents  x 8  . INCISION AND DRAINAGE     bilateral axillary, non specific staff  . INCISION AND DRAINAGE ABSCESS  09/28/2012   Procedure: INCISION AND DRAINAGE ABSCESS;  Surgeon: Zenovia Jarred, MD;  Location: [facility];  Service: General;  Laterality: Bilateral;  . stent     cardiac x 8 stents.    Allergies as of 05/22/2019      Reactions   Ambien [zolpidem Tartrate] Nausea And Vomiting   Codeine    Codeine Phosphate Nausea And Vomiting   Darvocet [propoxyphene N-acetaminophen] Nausea And Vomiting   Fish Allergy    Fish-derived Products    Hydrocodone    Promethazine    Promethazine Hcl Nausea And Vomiting   Sulfamethoxazole Other (See Comments)   Pt doesn't remember reaction   Vicodin [hydrocodone-acetaminophen] Other (See Comments)   Unknown reaction   Amoxicillin Nausea And Vomiting, Rash   Penicillins Nausea And Vomiting, Rash      Medication List       Accurate as of May 22, 2019  3:31 PM. If you have any questions, ask your nurse or doctor.  acetaminophen 325 MG tablet Commonly known as: TYLENOL Take 650 mg by mouth every 6 (six) hours as needed for mild pain, moderate pain or fever.   acetaminophen 325 MG tablet Commonly known as: TYLENOL Take 650 mg by mouth every 6 (six) hours. Take routinely for left hip pain   ALPRAZolam 0.5 MG tablet Commonly known as: XANAX Take 1 tablet (0.5 mg total) by mouth daily.   amLODipine 10 MG tablet Commonly known as: NORVASC Take 10 mg by mouth every morning.    aspirin EC 81 MG tablet Take 1 tablet (81 mg total) by mouth daily.   atorvastatin 10 MG tablet Commonly known as: LIPITOR Take 10 mg by mouth every evening.   donepezil 10 MG tablet Commonly known as: ARICEPT Take 10 mg by mouth every evening.   losartan 50 MG tablet Commonly known as: COZAAR Take 50 mg by mouth at bedtime.   multivitamin with minerals tablet Take 1 tablet by mouth daily.   nitroGLYCERIN 0.4 MG SL tablet Commonly known as: NITROSTAT Place 0.4 mg under the tongue every 5 (five) minutes as needed for chest pain.   NON FORMULARY Diet Type:  Liberalized due to poor appetite to Mech soft   NUTRITIONAL SUPPLEMENT PO Take 1 each by mouth 2 (two) times a day. Magic Cup - take with lunch and dinner   ondansetron 4 MG tablet Commonly known as: ZOFRAN Take 4 mg by mouth every 4 (four) hours as needed for nausea.   oxybutynin 5 MG tablet Commonly known as: DITROPAN Take 5 mg by mouth 2 (two) times daily.   polyethylene glycol 17 g packet Commonly known as: MIRALAX / GLYCOLAX Take 17 g by mouth daily as needed for moderate constipation.   predniSONE 1 MG tablet Commonly known as: DELTASONE Take 1 mg by mouth daily with breakfast.   ProAir HFA 108 (90 Base) MCG/ACT inhaler Generic drug: albuterol Inhale 2 puffs into the lungs every 6 (six) hours as needed for wheezing or shortness of breath.   rOPINIRole 1 MG tablet Commonly known as: REQUIP Take 1 mg by mouth See admin instructions. 1mg  at noon, 1mg  2 hours before bed, then 1mg  at bedtime   senna-docusate 8.6-50 MG tablet Commonly known as: Senokot-S Take 1 tablet by mouth 2 (two) times daily.   Silenor 6 MG Tabs Generic drug: Doxepin HCl Take 0.5 tablets (3 mg total) by mouth at bedtime.   traMADol 50 MG tablet Commonly known as: ULTRAM Take 1 tablet (50 mg total) by mouth 2 (two) times daily.   Tums 500 MG chewable tablet Generic drug: calcium carbonate Chew 1 tablet by mouth 3 (three) times  daily as needed for indigestion or heartburn.   Vitamin D3 1.25 MG (50000 UT) Caps Take 1 capsule by mouth once a week. What changed: Another medication with the same name was removed. Continue taking this medication, and follow the directions you see here. Changed by: Inocencio Homes, MD   warfarin 2 MG tablet Commonly known as: COUMADIN Take 2 mg by mouth daily.       No orders of the defined types were placed in this encounter.   Immunization History  Administered Date(s) Administered  . Influenza-Unspecified 08/01/2016, 08/11/2017, 07/02/2018  . PPD Test 03/18/2017  . Pneumococcal Conjugate-13 09/17/2017    Social History   Tobacco Use  . Smoking status: Never Smoker  . Smokeless tobacco: Never Used  Substance Use Topics  . Alcohol use: No     Vitals:  05/22/19 1509 05/22/19 1512  BP: (!) 148/62 (!) 146/64  Pulse: (!) 58 (!) 53  Resp: 17   Temp: (!) 96.9 F (36.1 C)    Body mass index is 23.73 kg/m.   Patient Active Problem List   Diagnosis Date Noted  . Failure to thrive in adult 01/08/2019  . Moderate protein malnutrition ([place]) 01/08/2019  . Pressure injury of skin 12/18/2018  . Intertrochanteric fracture of left femur ([place]) 12/11/2018  . CKD (chronic kidney disease), stage III ([place]) 12/11/2018  . Depression with anxiety 12/11/2018  . Overactive bladder 09/24/2017  . Insomnia 07/31/2017  . Anxiety 06/16/2017  . Citrobacter infection 03/19/2017  . AKI (acute kidney injury) ([place]) 03/19/2017  . History of pulmonary embolus (PE) 03/19/2017  . Elevated INR 03/19/2017  . Candidal intertrigo 03/19/2017  . Compression fracture of L2 ([place]) 03/12/2017  . Acute lower UTI 03/12/2017  . Accelerated hypertension 03/12/2017  . Leukocytosis 07/31/2015  . Acute bronchitis 07/28/2015  . Chronic anticoagulation 07/28/2015  . Intractable back pain 07/20/2015  . Back pain 07/20/2015  . Cellulitis and abscess of left buttock 04/12/2013  . Stricture and stenosis  of esophagus 10/10/2012  . Dementia ([place]) 10/06/2012  . Dysphagia 10/06/2012  . Axillary abscess - bilateral, multiple 09/27/2012  . Hypotension 08/24/2012  . Near syncope 08/24/2012  . Abscess of axilla, left 06/27/2012  . Bradycardia 06/13/2012  . Abscess 06/11/2012  . Chest pain 06/09/2012  . Cellulitis 06/09/2012  . Weakness of both legs 06/09/2012  . PMR (polymyalgia rheumatica) (HCC)   . Restless leg syndrome   . Angina pectoris, unstable ([place]) 03/02/2012  . Acute blood loss as cause of postoperative anemia 03/02/2012  . Dyspnea 02/03/2012  . UTI (urinary tract infection) 02/03/2012  . CAD (coronary artery disease)   . HTN (hypertension)   . VTE (venous thromboembolism) 10/01/2010  . HYPERCHOLESTEROLEMIA  IIA 08/27/2009  . Hyperlipidemia 12/11/2008  . HYPERTENSION, BENIGN 12/11/2008    CMP     Component Value Date/Time   NA 142 12/27/2018   K 3.6 12/27/2018   CL 105 12/17/2018 0417   CO2 22 12/17/2018 0417   GLUCOSE 104 (H) 12/17/2018 0417   BUN 32 (A) 12/27/2018   CREATININE 0.9 12/27/2018   CREATININE 0.99 12/17/2018 0417   CALCIUM 7.7 (L) 12/17/2018 0417   PROT 6.7 03/11/2017 2031   ALBUMIN 3.4 (L) 03/11/2017 2031   AST 9 (A) 08/01/2018   ALT 7 08/01/2018   ALKPHOS 58 08/01/2018   BILITOT 0.5 03/11/2017 2031   GFRNONAA 48 (L) 12/17/2018 0417   GFRAA 56 (L) 12/17/2018 0417   Recent Labs    12/14/18 0536 12/15/18 0911 12/16/18 0611 12/17/18 0417 12/27/18  NA 138 138 136 135 142  K 3.9 3.4* 4.0 4.1 3.6  CL 110 108 108 105  --   CO2 22 23 22 22   --   GLUCOSE 144* 85 96 104*  --   BUN 25* 25* 21 20 32*  CREATININE 1.13* 0.81 0.86 0.99 0.9  CALCIUM 8.3* 7.8* 7.8* 7.7*  --   MG 2.1 2.3 2.2  --   --    Recent Labs    08/01/18  AST 9*  ALT 7  ALKPHOS 58   Recent Labs    12/11/18 2308  12/16/18 0611 12/17/18 0417 12/18/18 0408 12/27/18  WBC 14.6*   < > 7.4 7.3 8.2 12.2  NEUTROABS 12.6*  --   --  4.7 5.3  --   HGB 11.0*   < >  7.3* 9.4* 9.5*  9.0*  HCT 36.4   < > 24.5* 31.3* 31.4* 28*  MCV 83.7   < > 83.3 83.2 83.3  --   PLT 311   < > 282 265 257 404*   < > = values in this interval not displayed.   Recent Labs    08/01/18  CHOL 140  LDLCALC 80  TRIG 82   No results found for: [facility] Lab Results  Component Value Date   TSH 1.71 08/01/2018   Lab Results  Component Value Date   HGBA1C 5.7 08/01/2018   Lab Results  Component Value Date   CHOL 140 08/01/2018   HDL 46 08/01/2018   LDLCALC 80 08/01/2018   TRIG 82 08/01/2018   CHOLHDL 2.4 03/03/2012    Significant Diagnostic Results in last 30 days:  No results found.  Assessment and Plan  Chronic PE/encounter for Coumadin- patient's INR is 2.8 on 2 mg daily; it is gone up from prior but patient has been stable on 2 mg for a long time so we will continue 2 mg daily and check INR in 1 week.     Hennie Duos , MD

## 2019-05-23 DIAGNOSIS — R2681 Unsteadiness on feet: Secondary | ICD-10-CM | POA: Diagnosis not present

## 2019-05-23 DIAGNOSIS — M6281 Muscle weakness (generalized): Secondary | ICD-10-CM | POA: Diagnosis not present

## 2019-05-23 DIAGNOSIS — S72142D Displaced intertrochanteric fracture of left femur, subsequent encounter for closed fracture with routine healing: Secondary | ICD-10-CM | POA: Diagnosis not present

## 2019-05-24 ENCOUNTER — Encounter: Payer: Self-pay | Admitting: Internal Medicine

## 2019-05-24 DIAGNOSIS — S72142D Displaced intertrochanteric fracture of left femur, subsequent encounter for closed fracture with routine healing: Secondary | ICD-10-CM | POA: Diagnosis not present

## 2019-05-24 DIAGNOSIS — R2681 Unsteadiness on feet: Secondary | ICD-10-CM | POA: Diagnosis not present

## 2019-05-24 DIAGNOSIS — M6281 Muscle weakness (generalized): Secondary | ICD-10-CM | POA: Diagnosis not present

## 2019-05-25 ENCOUNTER — Other Ambulatory Visit: Payer: Self-pay | Admitting: Internal Medicine

## 2019-05-25 DIAGNOSIS — S72142D Displaced intertrochanteric fracture of left femur, subsequent encounter for closed fracture with routine healing: Secondary | ICD-10-CM | POA: Diagnosis not present

## 2019-05-25 DIAGNOSIS — R2681 Unsteadiness on feet: Secondary | ICD-10-CM | POA: Diagnosis not present

## 2019-05-25 DIAGNOSIS — M6281 Muscle weakness (generalized): Secondary | ICD-10-CM | POA: Diagnosis not present

## 2019-05-28 DIAGNOSIS — M6281 Muscle weakness (generalized): Secondary | ICD-10-CM | POA: Diagnosis not present

## 2019-05-28 DIAGNOSIS — S72142D Displaced intertrochanteric fracture of left femur, subsequent encounter for closed fracture with routine healing: Secondary | ICD-10-CM | POA: Diagnosis not present

## 2019-05-28 DIAGNOSIS — R2681 Unsteadiness on feet: Secondary | ICD-10-CM | POA: Diagnosis not present

## 2019-05-29 DIAGNOSIS — M6281 Muscle weakness (generalized): Secondary | ICD-10-CM | POA: Diagnosis not present

## 2019-05-29 DIAGNOSIS — R2681 Unsteadiness on feet: Secondary | ICD-10-CM | POA: Diagnosis not present

## 2019-05-29 DIAGNOSIS — S72142D Displaced intertrochanteric fracture of left femur, subsequent encounter for closed fracture with routine healing: Secondary | ICD-10-CM | POA: Diagnosis not present

## 2019-05-30 ENCOUNTER — Non-Acute Institutional Stay (SKILLED_NURSING_FACILITY): Payer: Medicare Other | Admitting: Internal Medicine

## 2019-05-30 ENCOUNTER — Encounter: Payer: Self-pay | Admitting: Internal Medicine

## 2019-05-30 DIAGNOSIS — G4700 Insomnia, unspecified: Secondary | ICD-10-CM | POA: Diagnosis not present

## 2019-05-30 DIAGNOSIS — R2681 Unsteadiness on feet: Secondary | ICD-10-CM | POA: Diagnosis not present

## 2019-05-30 DIAGNOSIS — F419 Anxiety disorder, unspecified: Secondary | ICD-10-CM | POA: Diagnosis not present

## 2019-05-30 DIAGNOSIS — M6281 Muscle weakness (generalized): Secondary | ICD-10-CM | POA: Diagnosis not present

## 2019-05-30 DIAGNOSIS — F0390 Unspecified dementia without behavioral disturbance: Secondary | ICD-10-CM | POA: Diagnosis not present

## 2019-05-30 DIAGNOSIS — I10 Essential (primary) hypertension: Secondary | ICD-10-CM

## 2019-05-30 DIAGNOSIS — Z86711 Personal history of pulmonary embolism: Secondary | ICD-10-CM | POA: Diagnosis not present

## 2019-05-30 DIAGNOSIS — S72142D Displaced intertrochanteric fracture of left femur, subsequent encounter for closed fracture with routine healing: Secondary | ICD-10-CM | POA: Diagnosis not present

## 2019-05-30 NOTE — Progress Notes (Signed)
Location:  [facility] Room Number: 204-D Place of Service:  SNF [phone]) Provider:  Granville Lewis, PA-C  Hennie Duos, MD  Patient Care Team: Hennie Duos, MD as PCP - General (Internal Medicine)  Extended Emergency Contact Information Primary Emergency Contact: Whitebread,[name] Address: [place], [place] [address] [name] of [place] Phone: [phone] Mobile Phone: [phone] Relation: Son Secondary Emergency Contact: [place] of [place] Phone: [phone] Relation: Daughter  Code Status:  DNR Goals of care: Advanced Directive information Advanced Directives 05/22/2019  Does Patient Have a Medical Advance Directive? Yes  Type of Advance Directive Out of facility DNR (pink MOST or yellow form)  Does patient want to make changes to medical advance directive? No - Patient declined  Copy of [place] in Chart? -  Pre-existing out of facility DNR order (yellow form or pink MOST form) Yellow form placed in chart (order not valid for inpatient use)     Chief Complaint  Patient presents with  . Acute Visit    Patient is seen for anticoagulation management  With a history of a pulmonary embolism  HPI:  Pt is a 85 y.o. female seen today for an acute visit for anticoagulation management.  She does have a history of a chronic pulmonary embolism she is on Coumadin and she has been on 2 mg of Coumadin now for an extended period of time  Last week her INR was 2.8 it is 2.6 today it appears this has beenquite steady on the current dose She does not really have any complaints today- I do not note any increased bruising or bleeding  Past Medical History:  Diagnosis Date  . Anemia 03/02/2012  . Angina pectoris, unstable ([facility]) 03/02/2012  . Anxiety   . Axillary abscess   . Bradycardia 06/13/2012  . CAD (coronary artery disease)    S/p PTCA / stenting (last cath 2004,  multiple LAD stents, 2 stents in the right coronary artery all patent)  . Cancer ([facility])   . Complication of anesthesia   . Compression fracture of L2 ([facility]) 03/12/2017  . Dementia ([facility])   . Dysphagia 10/06/2012  . Dyspnea 02/03/2012  . GERD (gastroesophageal reflux disease)   . History of pulmonary embolus (PE) 03/19/2017  . HTN (hypertension)   . PMR (polymyalgia rheumatica) (HCC)   . PONV (postoperative nausea and vomiting)   . Restless leg syndrome   . 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 Started coumadin  . Weakness of both legs 06/09/2012   Past Surgical History:  Procedure Laterality Date  . BREAST LUMPECTOMY    . COLECTOMY    . ESOPHAGOGASTRODUODENOSCOPY (EGD) WITH ESOPHAGEAL DILATION  10/10/2012   Procedure: ESOPHAGOGASTRODUODENOSCOPY (EGD) WITH ESOPHAGEAL DILATION;  Surgeon: Inda Castle, MD;  Location: [facility];  Service: Endoscopy;  Laterality: N/A;  . FEMUR IM NAIL Left 12/13/2018   Procedure: INTRAMEDULLARY (IM) NAIL FEMORAL LEFT;  Surgeon: Paralee Cancel, MD;  Location[facility];  Service: Orthopedics;  Laterality: Left;  . heart stents  x 8  . INCISION AND DRAINAGE     bilateral axillary, non specific staff  . INCISION AND DRAINAGE ABSCESS  09/28/2012   Procedure: INCISION AND DRAINAGE ABSCESS;  Surgeon: Zenovia Jarred, MD;  Location: [facility];  Service: General;  Laterality: Bilateral;  . stent     cardiac x 8 stents.  Allergies  Allergen Reactions  . Ambien [Zolpidem Tartrate] Nausea And Vomiting  . Codeine   . Codeine Phosphate Nausea And Vomiting  . Darvocet [Propoxyphene N-Acetaminophen] Nausea And Vomiting  . Fish Allergy   . Fish-Derived Products   . Hydrocodone   . Promethazine   . Promethazine Hcl Nausea And Vomiting  . Sulfamethoxazole Other (See Comments)    Pt doesn't remember reaction  . Vicodin [Hydrocodone-Acetaminophen] Other (See Comments)    Unknown reaction  . Amoxicillin Nausea And Vomiting and Rash  . Penicillins Nausea  And Vomiting and Rash    Outpatient Encounter Medications as of 05/30/2019  Medication Sig  . acetaminophen (TYLENOL) 325 MG tablet Take 650 mg by mouth every 6 (six) hours as needed for mild pain, moderate pain or fever.  Marland Kitchen acetaminophen (TYLENOL) 325 MG tablet Take 650 mg by mouth every 6 (six) hours. Take routinely for left hip pain  . albuterol (PROAIR HFA) 108 (90 Base) MCG/ACT inhaler Inhale 2 puffs into the lungs every 6 (six) hours as needed for wheezing or shortness of breath.  . ALPRAZolam (XANAX) 0.5 MG tablet Take 1 tablet (0.5 mg total) by mouth daily.  Marland Kitchen amLODipine (NORVASC) 10 MG tablet Take 10 mg by mouth every morning.   Marland Kitchen aspirin EC 81 MG tablet Take 1 tablet (81 mg total) by mouth daily.  Marland Kitchen atorvastatin (LIPITOR) 10 MG tablet Take 10 mg by mouth every evening.  . calcium carbonate (TUMS) 500 MG chewable tablet Chew 1 tablet by mouth 3 (three) times daily as needed for indigestion or heartburn.  . Cholecalciferol (VITAMIN D3) 1.25 MG (50000 UT) CAPS Take 1 capsule by mouth once a week.  . donepezil (ARICEPT) 10 MG tablet Take 10 mg by mouth every evening.   Marland Kitchen losartan (COZAAR) 50 MG tablet Take 50 mg by mouth at bedtime.   . Multiple Vitamins-Minerals (MULTIVITAMIN WITH MINERALS) tablet Take 1 tablet by mouth daily.  . nitroGLYCERIN (NITROSTAT) 0.4 MG SL tablet Place 0.4 mg under the tongue every 5 (five) minutes as needed for chest pain.  . NON FORMULARY Diet Type:  Liberalized due to poor appetite to Mech soft  . Nutritional Supplements (NUTRITIONAL SUPPLEMENT PO) Take 1 each by mouth 2 (two) times a day. Magic Cup - take with lunch and dinner  . ondansetron (ZOFRAN) 4 MG tablet Take 4 mg by mouth every 4 (four) hours as needed for nausea.   Marland Kitchen oxybutynin (DITROPAN) 5 MG tablet Take 5 mg by mouth 2 (two) times daily.   . polyethylene glycol (MIRALAX / GLYCOLAX) packet Take 17 g by mouth daily as needed for moderate constipation.  . predniSONE (DELTASONE) 1 MG tablet Take 1 mg  by mouth daily with breakfast.   . rOPINIRole (REQUIP) 1 MG tablet Take 1 mg by mouth See admin instructions. 1mg  at noon, 1mg  2 hours before bed, then 1mg  at bedtime  . senna-docusate (SENOKOT-S) 8.6-50 MG tablet Take 1 tablet by mouth 2 (two) times daily.  . traMADol (ULTRAM) 50 MG tablet Take 1 tablet (50 mg total) by mouth 2 (two) times daily.  Marland Kitchen warfarin (COUMADIN) 2 MG tablet Take 2 mg by mouth daily.   Marland Kitchen SILENOR 6 MG TABS Take 0.5 tablets (3 mg total) by mouth at bedtime.   No facility-administered encounter medications on file as of 05/30/2019.     Review of Systems   In general she is not complaining of any fever or chills she does not really have any complaints today  Skin does not complain of rashes or itching or increased bruising.  Head ears eyes nose mouth and throat does not complain of visual changes or sore throat.  Respiratory does not complain of being short of breath or having a cough.  Cardiac is not complaining of chest pain appears to have some mild chronic lower extremity edema.  GI is not complaining of abdominal pain nausea vomiting diarrhea constipation.  Musculoskeletal does not complain of joint pain today.  Neurologic does not complain of dizziness headache or numbness.  Psych does not complain of being anxious or depressed  Immunization History  Administered Date(s) Administered  . Influenza-Unspecified 08/01/2016, 08/11/2017, 07/02/2018  . PPD Test 03/18/2017  . Pneumococcal Conjugate-13 09/17/2017   Pertinent  Health Maintenance Due  Topic Date Due  . PNA vac Low Risk Adult (2 of 2 - PPSV23) 09/17/2018  . INFLUENZA VACCINE  06/02/2019  . DEXA SCAN  Completed   Fall Risk  03/14/2018  Falls in the past year? No   Functional Status Survey:    Vitals:   05/30/19 1131  BP: 136/78  Pulse: 61  Resp: 18  Temp: 97.8 F (36.6 C)  TempSrc: Oral  Weight: 125 lb 9.6 oz (57 kg)  Height: 5\' 1"  (1.549 m)   Body mass index is 23.73 kg/m.  Physical Exam   In general this is a pleasant elderly female in no distress resting comfortably in bed.  Her skin is warm and dry she has some mild slight bruising on her arms that appears to be chronic.  Eyes visual acuity appears to be intact sclera and conjunctive are clear.  Oropharynx is clear mucous membranes moist.  Chest is clear to auscultation could not appreciate any labored breathing.  Heart is regular rate and rhythm with a 2 out of 6 to 3 out of 6 systolic murmur she has mild lower extremity edema.  Abdomen is soft nontender with positive bowel sounds.  Musculoskeletal Limited exam since she is lying in bed but appears able to move all extremities x4  Neurologic is grossly intact her speech is clear could not appreciate lateralizing findings.  Psych she is pleasant and appropriate responds easily to simple verbal commands findings consistent with dementia  Labs reviewed: Recent Labs    12/14/18 0536 12/15/18 0911 12/16/18 0611 12/17/18 0417 12/27/18  NA 138 138 136 135 142  K 3.9 3.4* 4.0 4.1 3.6  CL 110 108 108 105  --   CO2 22 23 22 22   --   GLUCOSE 144* 85 96 104*  --   BUN 25* 25* 21 20 32*  CREATININE 1.13* 0.81 0.86 0.99 0.9  CALCIUM 8.3* 7.8* 7.8* 7.7*  --   MG 2.1 2.3 2.2  --   --    Recent Labs    08/01/18  AST 9*  ALT 7  ALKPHOS 58   Recent Labs    12/11/18 2308  12/16/18 0611 12/17/18 0417 12/18/18 0408 12/27/18  WBC 14.6*   < > 7.4 7.3 8.2 12.2  NEUTROABS 12.6*  --   --  4.7 5.3  --   HGB 11.0*   < > 7.3* 9.4* 9.5* 9.0*  HCT 36.4   < > 24.5* 31.3* 31.4* 28*  MCV 83.7   < > 83.3 83.2 83.3  --   PLT 311   < > 282 265 257 404*   < > = values in this interval not displayed.   Lab Results  Component Value Date  TSH 1.71 08/01/2018   Lab Results  Component Value Date   HGBA1C 5.7 08/01/2018   Lab Results  Component Value Date   CHOL 140 08/01/2018   HDL 46 08/01/2018   LDLCALC 80 08/01/2018   TRIG 82 08/01/2018   CHOLHDL  2.4 03/03/2012    Significant Diagnostic Results in last 30 days:  No results found.  Assessment/Plan  #1 history of pulmonary --embolism chronic on chronic Coumadin--INR continues to be in therapeutic range at 2.6 on 2 mg of Coumadin today--continue current dose and will recheck in 1 week.  2.  Hypertension for review blood pressures appears occasionally she has some systolics above 014 but this does not appear to be consistent considering her age and comorbidities will continue current medications including Cozaar 50 mg a day and Norvasc 10 mg a day-recent blood pressures 155/69-136/78-145/67-109/70- 129/64  [id number]

## 2019-05-31 DIAGNOSIS — S72142D Displaced intertrochanteric fracture of left femur, subsequent encounter for closed fracture with routine healing: Secondary | ICD-10-CM | POA: Diagnosis not present

## 2019-05-31 DIAGNOSIS — R2681 Unsteadiness on feet: Secondary | ICD-10-CM | POA: Diagnosis not present

## 2019-05-31 DIAGNOSIS — M6281 Muscle weakness (generalized): Secondary | ICD-10-CM | POA: Diagnosis not present

## 2019-06-01 DIAGNOSIS — M6281 Muscle weakness (generalized): Secondary | ICD-10-CM | POA: Diagnosis not present

## 2019-06-01 DIAGNOSIS — S72142D Displaced intertrochanteric fracture of left femur, subsequent encounter for closed fracture with routine healing: Secondary | ICD-10-CM | POA: Diagnosis not present

## 2019-06-01 DIAGNOSIS — R2681 Unsteadiness on feet: Secondary | ICD-10-CM | POA: Diagnosis not present

## 2019-06-03 DIAGNOSIS — M6281 Muscle weakness (generalized): Secondary | ICD-10-CM | POA: Diagnosis not present

## 2019-06-03 DIAGNOSIS — R2681 Unsteadiness on feet: Secondary | ICD-10-CM | POA: Diagnosis not present

## 2019-06-03 DIAGNOSIS — S72142D Displaced intertrochanteric fracture of left femur, subsequent encounter for closed fracture with routine healing: Secondary | ICD-10-CM | POA: Diagnosis not present

## 2019-06-04 DIAGNOSIS — R2681 Unsteadiness on feet: Secondary | ICD-10-CM | POA: Diagnosis not present

## 2019-06-04 DIAGNOSIS — M6281 Muscle weakness (generalized): Secondary | ICD-10-CM | POA: Diagnosis not present

## 2019-06-04 DIAGNOSIS — S72142D Displaced intertrochanteric fracture of left femur, subsequent encounter for closed fracture with routine healing: Secondary | ICD-10-CM | POA: Diagnosis not present

## 2019-06-05 DIAGNOSIS — R2681 Unsteadiness on feet: Secondary | ICD-10-CM | POA: Diagnosis not present

## 2019-06-05 DIAGNOSIS — M6281 Muscle weakness (generalized): Secondary | ICD-10-CM | POA: Diagnosis not present

## 2019-06-05 DIAGNOSIS — S72142D Displaced intertrochanteric fracture of left femur, subsequent encounter for closed fracture with routine healing: Secondary | ICD-10-CM | POA: Diagnosis not present

## 2019-06-07 DIAGNOSIS — S72142D Displaced intertrochanteric fracture of left femur, subsequent encounter for closed fracture with routine healing: Secondary | ICD-10-CM | POA: Diagnosis not present

## 2019-06-07 DIAGNOSIS — R2681 Unsteadiness on feet: Secondary | ICD-10-CM | POA: Diagnosis not present

## 2019-06-07 DIAGNOSIS — M6281 Muscle weakness (generalized): Secondary | ICD-10-CM | POA: Diagnosis not present

## 2019-06-09 DIAGNOSIS — R2681 Unsteadiness on feet: Secondary | ICD-10-CM | POA: Diagnosis not present

## 2019-06-09 DIAGNOSIS — S72142D Displaced intertrochanteric fracture of left femur, subsequent encounter for closed fracture with routine healing: Secondary | ICD-10-CM | POA: Diagnosis not present

## 2019-06-09 DIAGNOSIS — M6281 Muscle weakness (generalized): Secondary | ICD-10-CM | POA: Diagnosis not present

## 2019-06-11 ENCOUNTER — Encounter: Payer: Self-pay | Admitting: Internal Medicine

## 2019-06-11 ENCOUNTER — Non-Acute Institutional Stay (SKILLED_NURSING_FACILITY): Payer: Medicare Other | Admitting: Internal Medicine

## 2019-06-11 DIAGNOSIS — Z86711 Personal history of pulmonary embolism: Secondary | ICD-10-CM

## 2019-06-11 DIAGNOSIS — R2681 Unsteadiness on feet: Secondary | ICD-10-CM | POA: Diagnosis not present

## 2019-06-11 DIAGNOSIS — G301 Alzheimer's disease with late onset: Secondary | ICD-10-CM

## 2019-06-11 DIAGNOSIS — M353 Polymyalgia rheumatica: Secondary | ICD-10-CM

## 2019-06-11 DIAGNOSIS — D62 Acute posthemorrhagic anemia: Secondary | ICD-10-CM

## 2019-06-11 DIAGNOSIS — M6281 Muscle weakness (generalized): Secondary | ICD-10-CM | POA: Diagnosis not present

## 2019-06-11 DIAGNOSIS — S72145D Nondisplaced intertrochanteric fracture of left femur, subsequent encounter for closed fracture with routine healing: Secondary | ICD-10-CM | POA: Diagnosis not present

## 2019-06-11 DIAGNOSIS — I10 Essential (primary) hypertension: Secondary | ICD-10-CM

## 2019-06-11 DIAGNOSIS — F0280 Dementia in other diseases classified elsewhere without behavioral disturbance: Secondary | ICD-10-CM

## 2019-06-11 DIAGNOSIS — S72142D Displaced intertrochanteric fracture of left femur, subsequent encounter for closed fracture with routine healing: Secondary | ICD-10-CM | POA: Diagnosis not present

## 2019-06-11 NOTE — Progress Notes (Signed)
Location:  [facility] Room Number: 204-D Place of Service:  SNF [phone]) Provider:  Granville Lewis, PA-C  Hennie Duos, MD  Patient Care Team: Hennie Duos, MD as PCP - General (Internal Medicine)  Extended Emergency Contact Information Primary Emergency Contact: Comins,[name] Address: [place], [place] [address] [name] of [place] Phone: [phone] Mobile Phone: [phone] Relation: Son Secondary Emergency Contact: [place] of [place] Phone: [phone] Relation: Daughter  Code Status:  DNR Goals of care: Advanced Directive information Advanced Directives 05/30/2019  Does Patient Have a Medical Advance Directive? Yes  Type of Advance Directive Out of facility DNR (pink MOST or yellow form)  Does patient want to make changes to medical advance directive? No - Guardian declined  Copy of [place] in Chart? -  Pre-existing out of facility DNR order (yellow form or pink MOST form) Yellow form placed in chart (order not valid for inpatient use)     Chief Complaint  Patient presents with  . Medical Management of Chronic Issues    Routine [name] visit  For medical management of chronic medical conditions including dementia-history of pulmonary embolism on chronic Coumadin- hypertension-coronary artery disease-hyperlipidemia overactive bladder polymyalgia rheumatica as well as restless leg syndrome history of left femoral fracture back in February 2020 as well as constipation  And  anxiety  HPI:  Pt is a 85 y.o. female seen today for medical management of chronic diseases.  As noted above.  Clinically she appears to be doing well nursing does not report any concerns vital signs appear to be stable she is lying in bed pleasantly working on her coloring books.  She does not complain of any pain or discomfort says she has a pretty good appetite.   Talking with staff she eats about 50% of her meals.  His weight is around 125 pounds which appears to be relatively stable compared to last month-her BMI is 23.58.  Regards to hypertension she has somewhat variable systolics but did not see consistent elevations she is on Cozaar 50 mg a day and Norvasc 10 mg a day recent blood pressures 117/64-143/71-139/70-appears to be relatively in this range.  She does have a history of coronary artery disease but appears to be stable in this regard she does have PRN nitro if needed-she is on aspirin 81 mg a day as well as atorvastatin 10 mg a day her LDL was 80 on a lab done in November.  Regards to polymyalgia rheumatica she is on prednisone she does not really have any complaints in that regards she also has a history of a left femoral fracture back in February but pain appears to be controlled on routine tramadol and Tylenol.  She is on chronic Coumadin with a history of pulmonary embolism in the past INR has been therapeutic for some time most recently 2.6 update INR is pending   Past Medical History:  Diagnosis Date  . Anemia 03/02/2012  . Angina pectoris, unstable ([facility]) 03/02/2012  . Anxiety   . Axillary abscess   . Bradycardia 06/13/2012  . CAD (coronary artery disease)    S/p PTCA / stenting (last cath 2004, multiple LAD stents, 2 stents in the right coronary artery all patent)  . Cancer ([facility])   . Complication of anesthesia   . Compression fracture of L2 ([facility]) 03/12/2017  . Dementia ([facility])   .  Dysphagia 10/06/2012  . Dyspnea 02/03/2012  . GERD (gastroesophageal reflux disease)   . History of pulmonary embolus (PE) 03/19/2017  . HTN (hypertension)   . PMR (polymyalgia rheumatica) ([facility])   . PONV (postoperative nausea and vomiting)   . Restless leg syndrome   . 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 Started coumadin  . Weakness of both legs 06/09/2012   Past Surgical History:  Procedure Laterality Date  . BREAST LUMPECTOMY    .  COLECTOMY    . ESOPHAGOGASTRODUODENOSCOPY (EGD) WITH ESOPHAGEAL DILATION  10/10/2012   Procedure: ESOPHAGOGASTRODUODENOSCOPY (EGD) WITH ESOPHAGEAL DILATION;  Surgeon: Inda Castle, MD;  Location: [facility];  Service: Endoscopy;  Laterality: N/A;  . FEMUR IM NAIL Left 12/13/2018   Procedure: INTRAMEDULLARY (IM) NAIL FEMORAL LEFT;  Surgeon: Paralee Cancel, MD;  [facility];  Service: Orthopedics;  Laterality: Left;  . heart stents  x 8  . INCISION AND DRAINAGE     bilateral axillary, non specific staff  . INCISION AND DRAINAGE ABSCESS  09/28/2012   Procedure: INCISION AND DRAINAGE ABSCESS;  Surgeon: Zenovia Jarred, MD;  Location: [facility];  Service: General;  Laterality: Bilateral;  . stent     cardiac x 8 stents.    Allergies  Allergen Reactions  . Ambien [Zolpidem Tartrate] Nausea And Vomiting  . Codeine   . Codeine Phosphate Nausea And Vomiting  . Darvocet [Propoxyphene N-Acetaminophen] Nausea And Vomiting  . Fish Allergy   . Fish-Derived Products   . Hydrocodone   . Promethazine   . Promethazine Hcl Nausea And Vomiting  . Sulfamethoxazole Other (See Comments)    Pt doesn't remember reaction  . Vicodin [Hydrocodone-Acetaminophen] Other (See Comments)    Unknown reaction  . Amoxicillin Nausea And Vomiting and Rash  . Penicillins Nausea And Vomiting and Rash    Outpatient Encounter Medications as of 06/11/2019  Medication Sig  . acetaminophen (TYLENOL) 325 MG tablet Take 650 mg by mouth every 6 (six) hours as needed for mild pain, moderate pain or fever.  Marland Kitchen acetaminophen (TYLENOL) 325 MG tablet Take 650 mg by mouth every 6 (six) hours. Take routinely for left hip pain  . albuterol (PROAIR HFA) 108 (90 Base) MCG/ACT inhaler Inhale 2 puffs into the lungs every 6 (six) hours as needed for wheezing or shortness of breath.  . ALPRAZolam (XANAX) 0.5 MG tablet Take 1 tablet (0.5 mg total) by mouth daily.  Marland Kitchen amLODipine (NORVASC) 10 MG tablet Take 10 mg by mouth every morning.    Marland Kitchen aspirin EC 81 MG tablet Take 1 tablet (81 mg total) by mouth daily.  Marland Kitchen atorvastatin (LIPITOR) 10 MG tablet Take 10 mg by mouth every evening.  . calcium carbonate (TUMS) 500 MG chewable tablet Chew 1 tablet by mouth 3 (three) times daily as needed for indigestion or heartburn.  . Cholecalciferol (VITAMIN D3) 1.25 MG (50000 UT) CAPS Take 1 capsule by mouth once a week.  . donepezil (ARICEPT) 10 MG tablet Take 10 mg by mouth every evening.   Marland Kitchen losartan (COZAAR) 50 MG tablet Take 50 mg by mouth at bedtime.   . Multiple Vitamins-Minerals (MULTIVITAMIN WITH MINERALS) tablet Take 1 tablet by mouth daily.  . nitroGLYCERIN (NITROSTAT) 0.4 MG SL tablet Place 0.4 mg under the tongue every 5 (five) minutes as needed for chest pain.  . NON FORMULARY Diet Type:  Liberalized due to poor appetite to Mech soft  . Nutritional Supplements (NUTRITIONAL SUPPLEMENT PO) Take 1 each by mouth 2 (  two) times a day. Magic Cup - take with lunch and dinner  . ondansetron (ZOFRAN) 4 MG tablet Take 4 mg by mouth every 4 (four) hours as needed for nausea.   Marland Kitchen oxybutynin (DITROPAN) 5 MG tablet Take 5 mg by mouth 2 (two) times daily.   . polyethylene glycol (MIRALAX / GLYCOLAX) packet Take 17 g by mouth daily as needed for moderate constipation.  . predniSONE (DELTASONE) 1 MG tablet Take 1 mg by mouth daily with breakfast.   . rOPINIRole (REQUIP) 1 MG tablet Take 1 mg by mouth See admin instructions. 1mg  at noon, 1mg  2 hours before bed, then 1mg  at bedtime  . senna-docusate (SENOKOT-S) 8.6-50 MG tablet Take 1 tablet by mouth 2 (two) times daily.  Marland Kitchen SILENOR 6 MG TABS Take 0.5 tablets (3 mg total) by mouth at bedtime.  . traMADol (ULTRAM) 50 MG tablet Take 1 tablet (50 mg total) by mouth 2 (two) times daily.  Marland Kitchen warfarin (COUMADIN) 2 MG tablet Take 2 mg by mouth daily.    No facility-administered encounter medications on file as of 06/11/2019.     Review of Systems   This is somewhat limited since patient is a poor historian.   General she is not complaining of any fever or chills.  Skin is not complain of rashes or itching.  Head ears eyes nose mouth and throat is not complaining of any visual changes or sore throat.  Respiratory is not complaining of shortness of breath or cough.  Cardiac no complaints of chest pain or increased edema.  GI denies any abdominal pain nausea vomiting diarrhea does have a history of constipation apparently this is controlled on current meds.  GU is not planing of dysuria she does have a history of overactive bladder  Musculoskeletal she denies any joint pain currently.  Neurologic does not complain of numbness dizziness headache or syncope.  Psych does have a history of dementia with anxiety but this appears to be be stable with supportive care   Immunization History  Administered Date(s) Administered  . Influenza-Unspecified 08/01/2016, 08/11/2017, 07/02/2018  . PPD Test 03/18/2017  . Pneumococcal Conjugate-13 09/17/2017   Pertinent  Health Maintenance Due  Topic Date Due  . PNA vac Low Risk Adult (2 of 2 - PPSV23) 09/17/2018  . INFLUENZA VACCINE  06/02/2019  . DEXA SCAN  Completed   Fall Risk  03/14/2018  Falls in the past year? No   Functional Status Survey:    Vitals:   06/11/19 1044  BP: 117/64  Pulse: 82  Resp: 16  Temp: 98.9 F (37.2 C)  TempSrc: Oral  Weight: 124 lb 12.8 oz (56.6 kg)  Height: 5\' 1"  (1.549 m)   Body mass index is 23.58 kg/m. Physical Exam   In general this is a pleasant elderly female in no distress resting in bed comfortably working on her coloring book.  Her skin is warm and dry.  Eyes visual acuity appears to be intact sclera and conjunctive are clear.  Oropharynx is clear mucous membranes moist she has numerous extractions.  Heart--rhythm is regular with bradycardic rate in the 50s she does not have significant lower extremity edema.  Abdomen is soft nontender with positive bowel sounds.  Musculoskeletal is able  to move all extremities x4 limited exam since she is in bed but appears able to move these at baseline with baseline lower extremity weakness.  Neurologic is grossly intact her speech is clear no lateralizing findings cranial nerves intact.  Psych she is  oriented to self she is pleasant appropriate follow simple verbal commands without difficulty.     Labs reviewed: Recent Labs    12/14/18 0536 12/15/18 0911 12/16/18 0611 12/17/18 0417 12/27/18  NA 138 138 136 135 142  K 3.9 3.4* 4.0 4.1 3.6  CL 110 108 108 105  --   CO2 22 23 22 22   --   GLUCOSE 144* 85 96 104*  --   BUN 25* 25* 21 20 32*  CREATININE 1.13* 0.81 0.86 0.99 0.9  CALCIUM 8.3* 7.8* 7.8* 7.7*  --   MG 2.1 2.3 2.2  --   --    Recent Labs    08/01/18  AST 9*  ALT 7  ALKPHOS 58   Recent Labs    12/11/18 2308  12/16/18 0611 12/17/18 0417 12/18/18 0408 12/27/18  WBC 14.6*   < > 7.4 7.3 8.2 12.2  NEUTROABS 12.6*  --   --  4.7 5.3  --   HGB 11.0*   < > 7.3* 9.4* 9.5* 9.0*  HCT 36.4   < > 24.5* 31.3* 31.4* 28*  MCV 83.7   < > 83.3 83.2 83.3  --   PLT 311   < > 282 265 257 404*   < > = values in this interval not displayed.   Lab Results  Component Value Date   TSH 1.71 08/01/2018   Lab Results  Component Value Date   HGBA1C 5.7 08/01/2018   Lab Results  Component Value Date   CHOL 140 08/01/2018   HDL 46 08/01/2018   LDLCALC 80 08/01/2018   TRIG 82 08/01/2018   CHOLHDL 2.4 03/03/2012    Significant Diagnostic Results in last 30 days:  No results found.  Assessment/Plan  #1 history of pulmonary embolism she is on chronic Coumadin INR has been therapeutic most recently 2.6-update INR is pending at this point she is on Coumadin 2 mg a day and is stable clinically.  2.  History of hypertension this appears to be fairly stable on Cozaar 50 mg a day and Norvasc 10 mg a day as noted above will update a metabolic panel.  3.  History of coronary artery disease this appears to be stable and relatively  asymptomatic she is on aspirin 81 mg a day as well as atorvastatin 10 mg a day she has PRN nitro if needed.  4.-History of dementia this appears to be stable with supportive care weight appears to be relatively stable she is on Aricept 10 mg a day.  5.  History of hyperlipidemia as noted above she is on atorvastatin LDL was 80 on November lab.  6.  History of overactive bladder she is on Ditropan 5 mg twice daily this appears to be relatively stable.  7.  History of polymyalgia rheumatica she continues on prednisone 1 mg a day she does not really complain of pain this morning.  8.  History of restless leg syndrome she is on Requip 1 mg twice daily she is not complaining of symptoms today.  9.  History of left femoral fracture with repair back in February-pain appears to be controlled with the tramadol 50 mg twice daily and Tylenol twice a day as well.  10.  History of insomnia she is on Silenor 3 mg nightly  #11 history of anxiety she is on Xanax once a day 0.5 mg this appears to be well controlled.  12.  History of anemia her last hemoglobin was 9.0 on February lab--it appears in the  hospital.  Status post hip repair-- her hemoglobin was 7.3 and she did get a transfusion of 1 unit- will update this to ensure stability  #13 history of vitamin D deficiency she is on supplementation level was 34.2 [place]  Update lab is pending for later this year.  14.  History of constipation she is on senna as well as MiraLAX apparently this has not been an issue recently.  #15 history of bradycardia appears she frequently does have pulses in the 50s occasionally even high 40s but she is asymptomatic- at this point will monitor she appears to be asymptomatic and I suspect that advanced age would be a questionable candidate for aggressive intervention as long as she remains asymptomatic-this was discussed with Dr. [name]  Again she will be getting a PT/INR scheduled for August 12-also will update her  CBC and CMP for updated values.  [id number] note greater than 40 minutes spent assessing patient-reviewing her chart and labs- and coordinating and formulating a plan of care for numerous diagnoses- of note greater than 50% of time spent coordinating a plan of care with input as noted above

## 2019-06-12 DIAGNOSIS — I2699 Other pulmonary embolism without acute cor pulmonale: Secondary | ICD-10-CM | POA: Diagnosis not present

## 2019-06-12 DIAGNOSIS — E559 Vitamin D deficiency, unspecified: Secondary | ICD-10-CM | POA: Diagnosis not present

## 2019-06-12 DIAGNOSIS — D649 Anemia, unspecified: Secondary | ICD-10-CM | POA: Diagnosis not present

## 2019-06-12 DIAGNOSIS — R2681 Unsteadiness on feet: Secondary | ICD-10-CM | POA: Diagnosis not present

## 2019-06-12 DIAGNOSIS — M6281 Muscle weakness (generalized): Secondary | ICD-10-CM | POA: Diagnosis not present

## 2019-06-12 DIAGNOSIS — S72142D Displaced intertrochanteric fracture of left femur, subsequent encounter for closed fracture with routine healing: Secondary | ICD-10-CM | POA: Diagnosis not present

## 2019-06-12 DIAGNOSIS — E119 Type 2 diabetes mellitus without complications: Secondary | ICD-10-CM | POA: Diagnosis not present

## 2019-06-12 LAB — HEPATIC FUNCTION PANEL
ALT: 12 (ref 7–35)
AST: 14 (ref 13–35)
Alkaline Phosphatase: 49 (ref 25–125)
Bilirubin, Total: 0.2

## 2019-06-12 LAB — BASIC METABOLIC PANEL
BUN: 25 — AB (ref 4–21)
Creatinine: 0.8 (ref 0.5–1.1)
Glucose: 90
Potassium: 4.3 (ref 3.4–5.3)
Sodium: 141 (ref 137–147)

## 2019-06-12 LAB — CBC AND DIFFERENTIAL
HCT: 35 — AB (ref 36–46)
Hemoglobin: 11.1 — AB (ref 12.0–16.0)
Neutrophils Absolute: 3
Platelets: 279 (ref 150–399)
WBC: 7

## 2019-06-13 DIAGNOSIS — S72142D Displaced intertrochanteric fracture of left femur, subsequent encounter for closed fracture with routine healing: Secondary | ICD-10-CM | POA: Diagnosis not present

## 2019-06-13 DIAGNOSIS — R2681 Unsteadiness on feet: Secondary | ICD-10-CM | POA: Diagnosis not present

## 2019-06-13 DIAGNOSIS — M6281 Muscle weakness (generalized): Secondary | ICD-10-CM | POA: Diagnosis not present

## 2019-06-14 DIAGNOSIS — M6281 Muscle weakness (generalized): Secondary | ICD-10-CM | POA: Diagnosis not present

## 2019-06-14 DIAGNOSIS — S72142D Displaced intertrochanteric fracture of left femur, subsequent encounter for closed fracture with routine healing: Secondary | ICD-10-CM | POA: Diagnosis not present

## 2019-06-14 DIAGNOSIS — R2681 Unsteadiness on feet: Secondary | ICD-10-CM | POA: Diagnosis not present

## 2019-06-15 DIAGNOSIS — S72142D Displaced intertrochanteric fracture of left femur, subsequent encounter for closed fracture with routine healing: Secondary | ICD-10-CM | POA: Diagnosis not present

## 2019-06-15 DIAGNOSIS — M6281 Muscle weakness (generalized): Secondary | ICD-10-CM | POA: Diagnosis not present

## 2019-06-15 DIAGNOSIS — R2681 Unsteadiness on feet: Secondary | ICD-10-CM | POA: Diagnosis not present

## 2019-06-17 DIAGNOSIS — M6281 Muscle weakness (generalized): Secondary | ICD-10-CM | POA: Diagnosis not present

## 2019-06-17 DIAGNOSIS — R2681 Unsteadiness on feet: Secondary | ICD-10-CM | POA: Diagnosis not present

## 2019-06-17 DIAGNOSIS — S72142D Displaced intertrochanteric fracture of left femur, subsequent encounter for closed fracture with routine healing: Secondary | ICD-10-CM | POA: Diagnosis not present

## 2019-06-19 DIAGNOSIS — R2681 Unsteadiness on feet: Secondary | ICD-10-CM | POA: Diagnosis not present

## 2019-06-19 DIAGNOSIS — M6281 Muscle weakness (generalized): Secondary | ICD-10-CM | POA: Diagnosis not present

## 2019-06-19 DIAGNOSIS — S72142D Displaced intertrochanteric fracture of left femur, subsequent encounter for closed fracture with routine healing: Secondary | ICD-10-CM | POA: Diagnosis not present

## 2019-06-20 ENCOUNTER — Encounter: Payer: Self-pay | Admitting: Internal Medicine

## 2019-06-20 ENCOUNTER — Non-Acute Institutional Stay (SKILLED_NURSING_FACILITY): Payer: Medicare Other | Admitting: Internal Medicine

## 2019-06-20 DIAGNOSIS — S72142D Displaced intertrochanteric fracture of left femur, subsequent encounter for closed fracture with routine healing: Secondary | ICD-10-CM | POA: Diagnosis not present

## 2019-06-20 DIAGNOSIS — Z86711 Personal history of pulmonary embolism: Secondary | ICD-10-CM

## 2019-06-20 DIAGNOSIS — Z5181 Encounter for therapeutic drug level monitoring: Secondary | ICD-10-CM | POA: Diagnosis not present

## 2019-06-20 DIAGNOSIS — Z7901 Long term (current) use of anticoagulants: Secondary | ICD-10-CM

## 2019-06-20 DIAGNOSIS — I10 Essential (primary) hypertension: Secondary | ICD-10-CM

## 2019-06-20 DIAGNOSIS — D62 Acute posthemorrhagic anemia: Secondary | ICD-10-CM | POA: Diagnosis not present

## 2019-06-20 DIAGNOSIS — R2681 Unsteadiness on feet: Secondary | ICD-10-CM | POA: Diagnosis not present

## 2019-06-20 DIAGNOSIS — M6281 Muscle weakness (generalized): Secondary | ICD-10-CM | POA: Diagnosis not present

## 2019-06-20 NOTE — Progress Notes (Signed)
Location:  [facility] Room Number: 204-D Place of Service:  SNF [phone]) Provider:  Granville Lewis, PA-C  Hennie Duos, MD  Patient Care Team: Hennie Duos, MD as PCP - General (Internal Medicine)  Extended Emergency Contact Information Primary Emergency Contact: Tesoro,[name] Address: [place], [place] [address] [name] of [place] Phone: [phone] Mobile Phone: [phone] Relation: Son Secondary Emergency Contact: [place] of [place] Phone: [phone] Relation: Daughter  Code Status:  DNR Goals of care: Advanced Directive information Advanced Directives 06/11/2019  Does Patient Have a Medical Advance Directive? Yes  Type of Advance Directive Out of facility DNR (pink MOST or yellow form)  Does patient want to make changes to medical advance directive? No - Patient declined  Copy of [place] in Chart? -  Pre-existing out of facility DNR order (yellow form or pink MOST form) Yellow form placed in chart (order not valid for inpatient use)     Chief Complaint  Patient presents with  . Acute Visit    Anticoagulation management with subtherapeutic INR    HPI:  Pt is a 85 y.o. female seen today for anticoagulation management.  Patient is on chronic Coumadin secondary to history of pulmonary embolism in the past.  Her INRs have been quite stable largely in the 2-3 range-today however has gone down slightly down to 1.7.  She has been on 2 mg a day for several weeks now and been quite stable.  Last week's INR was 2.8.  There has been no increased bruising or bleeding she has no complaints today.  She is currently resting comfortably in bed eating her lunch   Past Medical History:  Diagnosis Date  . Anemia 03/02/2012  . Angina pectoris, unstable ([place]) 03/02/2012  . Anxiety   . Axillary abscess   . Bradycardia 06/13/2012  . CAD (coronary artery  disease)    S/p PTCA / stenting (last cath 2004, multiple LAD stents, 2 stents in the right coronary artery all patent)  . Cancer ([place])   . Complication of anesthesia   . Compression fracture of L2 ([place]) 03/12/2017  . Dementia ([place])   . Dysphagia 10/06/2012  . Dyspnea 02/03/2012  . GERD (gastroesophageal reflux disease)   . History of pulmonary embolus (PE) 03/19/2017  . HTN (hypertension)   . PMR (polymyalgia rheumatica) (HCC)   . PONV (postoperative nausea and vomiting)   . Restless leg syndrome   . 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 Started coumadin  . Weakness of both legs 06/09/2012   Past Surgical History:  Procedure Laterality Date  . BREAST LUMPECTOMY    . COLECTOMY    . ESOPHAGOGASTRODUODENOSCOPY (EGD) WITH ESOPHAGEAL DILATION  10/10/2012   Procedure: ESOPHAGOGASTRODUODENOSCOPY (EGD) WITH ESOPHAGEAL DILATION;  Surgeon: Inda Castle, MD;  Location: [facility];  Service: Endoscopy;  Laterality: N/A;  . FEMUR IM NAIL Left 12/13/2018   Procedure: INTRAMEDULLARY (IM) NAIL FEMORAL LEFT;  Surgeon: Paralee Cancel, MD;  [facility];  Service: Orthopedics;  Laterality: Left;  . heart stents  x 8  . INCISION AND DRAINAGE     bilateral axillary, non specific staff  . INCISION AND DRAINAGE ABSCESS  09/28/2012   Procedure: INCISION AND DRAINAGE ABSCESS;  Surgeon: Zenovia Jarred, MD;  Location: [facility];  Service: General;  Laterality: Bilateral;  . stent     cardiac x  8 stents.    Allergies  Allergen Reactions  . Ambien [Zolpidem Tartrate] Nausea And Vomiting  . Codeine   . Codeine Phosphate Nausea And Vomiting  . Darvocet [Propoxyphene N-Acetaminophen] Nausea And Vomiting  . Fish Allergy   . Fish-Derived Products   . Hydrocodone   . Promethazine   . Promethazine Hcl Nausea And Vomiting  . Sulfamethoxazole Other (See Comments)    Pt doesn't remember reaction  . Vicodin [Hydrocodone-Acetaminophen] Other (See Comments)    Unknown reaction  . Amoxicillin  Nausea And Vomiting and Rash  . Penicillins Nausea And Vomiting and Rash    Outpatient Encounter Medications as of 06/20/2019  Medication Sig  . acetaminophen (TYLENOL) 325 MG tablet Take 650 mg by mouth every 6 (six) hours as needed for mild pain, moderate pain or fever.  Marland Kitchen acetaminophen (TYLENOL) 325 MG tablet Take 650 mg by mouth every 6 (six) hours. Take routinely for left hip pain  . albuterol (PROAIR HFA) 108 (90 Base) MCG/ACT inhaler Inhale 2 puffs into the lungs every 6 (six) hours as needed for wheezing or shortness of breath.  . ALPRAZolam (XANAX) 0.5 MG tablet Take 1 tablet (0.5 mg total) by mouth daily.  Marland Kitchen amLODipine (NORVASC) 10 MG tablet Take 10 mg by mouth every morning.   Marland Kitchen aspirin EC 81 MG tablet Take 1 tablet (81 mg total) by mouth daily.  Marland Kitchen atorvastatin (LIPITOR) 10 MG tablet Take 10 mg by mouth every evening.  . calcium carbonate (TUMS) 500 MG chewable tablet Chew 1 tablet by mouth 3 (three) times daily as needed for indigestion or heartburn.  . Cholecalciferol (VITAMIN D3) 1.25 MG (50000 UT) CAPS Take 1 capsule by mouth once a week.  . donepezil (ARICEPT) 10 MG tablet Take 10 mg by mouth every evening.   Marland Kitchen losartan (COZAAR) 50 MG tablet Take 50 mg by mouth at bedtime.   . nitroGLYCERIN (NITROSTAT) 0.4 MG SL tablet Place 0.4 mg under the tongue every 5 (five) minutes as needed for chest pain.  . NON FORMULARY Diet Type:  Liberalized due to poor appetite to Mech soft  . Nutritional Supplements (NUTRITIONAL SUPPLEMENT PO) Take 1 each by mouth 2 (two) times a day. Magic Cup - take with lunch and dinner  . ondansetron (ZOFRAN) 4 MG tablet Take 4 mg by mouth every 4 (four) hours as needed for nausea.   Marland Kitchen oxybutynin (DITROPAN) 5 MG tablet Take 5 mg by mouth 2 (two) times daily.   . polyethylene glycol (MIRALAX / GLYCOLAX) packet Take 17 g by mouth daily as needed for moderate constipation.  . predniSONE (DELTASONE) 1 MG tablet Take 1 mg by mouth daily with breakfast.   .  rOPINIRole (REQUIP) 1 MG tablet Take 1 mg by mouth See admin instructions. 1mg  at noon, 1mg  2 hours before bed, then 1mg  at bedtime  . senna-docusate (SENOKOT-S) 8.6-50 MG tablet Take 1 tablet by mouth 2 (two) times daily.  Marland Kitchen SILENOR 6 MG TABS Take 0.5 tablets (3 mg total) by mouth at bedtime.  . traMADol (ULTRAM) 50 MG tablet Take 1 tablet (50 mg total) by mouth 2 (two) times daily.  Marland Kitchen warfarin (COUMADIN) 2 MG tablet Take 2 mg by mouth daily.   . [DISCONTINUED] Multiple Vitamins-Minerals (MULTIVITAMIN WITH MINERALS) tablet Take 1 tablet by mouth daily.   No facility-administered encounter medications on file as of 06/20/2019.     Review of Systems   This is limited secondary to dementia.  General no complaints of fever chills.  Skin no complaints of rashes itching increased bruising or bleeding.  Head ears eyes nose mouth and throat does not complain of visual changes or sore throat.  Respiratory is not complaining of being short of breath or having a cough.  Cardiac does not complain of chest pain or greatly increased edema.  GI does not complain of abdominal discomfort nausea vomiting diarrhea constipation.  GU no complaints of dysuria.  Musculoskeletal she not complaining of any joint pain.  Neurologic does not complain of feeling dizzy or having a headache or feeling faint.  Psych does have some history of dementia does not complain of being depressed or anxious  Immunization History  Administered Date(s) Administered  . Influenza-Unspecified 08/01/2016, 08/11/2017, 07/02/2018  . PPD Test 03/18/2017  . Pneumococcal Conjugate-13 09/17/2017   Pertinent  Health Maintenance Due  Topic Date Due  . PNA vac Low Risk Adult (2 of 2 - PPSV23) 09/17/2018  . INFLUENZA VACCINE  06/02/2019  . DEXA SCAN  Completed   Fall Risk  03/14/2018  Falls in the past year? No   Functional Status Survey:    Vitals:   06/20/19 1204  BP: 130/62  Pulse: 74  Resp: 17  Temp: (!) 97.1 F  (36.2 C)  TempSrc: Oral  Weight: 124 lb 12.8 oz (56.6 kg)  Height: 5\' 1"  (1.549 m)   Body mass index is 23.58 kg/m. Physical Exam  General this is a pleasant elderly female no distress resting comfortably in bed she is about to eat her lunch.  Her skin is warm and dry I do not note any new bruising or bleeding.  Eyes sclera and conjunctive are clear visual acuity appears to be intact.  Oropharynx is clear mucous membranes moist.  Chest is clear to auscultation there is no labored breathing.  Heart is bradycardic in the 50s-she has minimal lower extremity edema.  Abdomen is soft nontender with positive bowel sounds.  Musculoskeletal continues to be a limited exam since she is in bed but is able to appears to move all her extremities at baseline.  Neurologic is grossly intact without lateralizing findings her speech is clear he does not speak a whole lot.  Psych she is oriented to self he is pleasant and appropriate follow simple verbal commands  Labs reviewed:  June 20, 2019.  INR is 1.7.  August  10/2019.  INR was 2.8 Recent Labs    12/14/18 0536 12/15/18 0911 12/16/18 0611 12/17/18 0417 12/27/18 06/12/19  NA 138 138 136 135 142 141  K 3.9 3.4* 4.0 4.1 3.6 4.3  CL 110 108 108 105  --   --   CO2 22 23 22 22   --   --   GLUCOSE 144* 85 96 104*  --   --   BUN 25* 25* 21 20 32* 25*  CREATININE 1.13* 0.81 0.86 0.99 0.9 0.8  CALCIUM 8.3* 7.8* 7.8* 7.7*  --   --   MG 2.1 2.3 2.2  --   --   --    Recent Labs    08/01/18 06/12/19  AST 9* 14  ALT 7 12  ALKPHOS 58 49   Recent Labs    12/16/18 0611 12/17/18 0417 12/18/18 0408 12/27/18 06/12/19  WBC 7.4 7.3 8.2 12.2 7.0  NEUTROABS  --  4.7 5.3  --  3  HGB 7.3* 9.4* 9.5* 9.0* 11.1*  HCT 24.5* 31.3* 31.4* 28* 35*  MCV 83.3 83.2 83.3  --   --   PLT 282 265  257 404* 279   Lab Results  Component Value Date   TSH 1.71 08/01/2018   Lab Results  Component Value Date   HGBA1C 5.7 08/01/2018   Lab Results   Component Value Date   CHOL 140 08/01/2018   HDL 46 08/01/2018   LDLCALC 80 08/01/2018   TRIG 82 08/01/2018   CHOLHDL 2.4 03/03/2012    Significant Diagnostic Results in last 30 days:  No results found.  Assessment/Plan  #1 anticoagulation management with subtherapeutic INR with history of pulmonary embolism in the past- will slightly increase her Coumadin to 2.5 mg on Monday Wednesday Fridays continue 2 mg all other days and recheck an INR August 24--clinically at this point appears to be stable.   #2 history of anemia hemoglobin actually has shown improvement up to 11.1 it was 9.0 back in February actually was 7.3 status post hip repair in the hospital before that.  3.-  Hypertension this appears to be stable recent blood pressures 134/64-140/56-130/62 considering her advanced age this is acceptable she continues on Cozaar 50 mg a day as well as Norvasc 10 mg a day   [phone]

## 2019-06-23 DIAGNOSIS — M6281 Muscle weakness (generalized): Secondary | ICD-10-CM | POA: Diagnosis not present

## 2019-06-23 DIAGNOSIS — R2681 Unsteadiness on feet: Secondary | ICD-10-CM | POA: Diagnosis not present

## 2019-06-23 DIAGNOSIS — S72142D Displaced intertrochanteric fracture of left femur, subsequent encounter for closed fracture with routine healing: Secondary | ICD-10-CM | POA: Diagnosis not present

## 2019-06-24 DIAGNOSIS — M6281 Muscle weakness (generalized): Secondary | ICD-10-CM | POA: Diagnosis not present

## 2019-06-24 DIAGNOSIS — R2681 Unsteadiness on feet: Secondary | ICD-10-CM | POA: Diagnosis not present

## 2019-06-24 DIAGNOSIS — S72142D Displaced intertrochanteric fracture of left femur, subsequent encounter for closed fracture with routine healing: Secondary | ICD-10-CM | POA: Diagnosis not present

## 2019-06-25 DIAGNOSIS — R2681 Unsteadiness on feet: Secondary | ICD-10-CM | POA: Diagnosis not present

## 2019-06-25 DIAGNOSIS — M6281 Muscle weakness (generalized): Secondary | ICD-10-CM | POA: Diagnosis not present

## 2019-06-25 DIAGNOSIS — S72142D Displaced intertrochanteric fracture of left femur, subsequent encounter for closed fracture with routine healing: Secondary | ICD-10-CM | POA: Diagnosis not present

## 2019-06-26 DIAGNOSIS — S72142D Displaced intertrochanteric fracture of left femur, subsequent encounter for closed fracture with routine healing: Secondary | ICD-10-CM | POA: Diagnosis not present

## 2019-06-26 DIAGNOSIS — R2681 Unsteadiness on feet: Secondary | ICD-10-CM | POA: Diagnosis not present

## 2019-06-26 DIAGNOSIS — M6281 Muscle weakness (generalized): Secondary | ICD-10-CM | POA: Diagnosis not present

## 2019-06-27 DIAGNOSIS — S72142D Displaced intertrochanteric fracture of left femur, subsequent encounter for closed fracture with routine healing: Secondary | ICD-10-CM | POA: Diagnosis not present

## 2019-06-27 DIAGNOSIS — M6281 Muscle weakness (generalized): Secondary | ICD-10-CM | POA: Diagnosis not present

## 2019-06-27 DIAGNOSIS — R2681 Unsteadiness on feet: Secondary | ICD-10-CM | POA: Diagnosis not present

## 2019-07-03 ENCOUNTER — Other Ambulatory Visit: Payer: Self-pay | Admitting: Internal Medicine

## 2019-07-03 MED ORDER — TRAMADOL HCL 50 MG PO TABS: 50.0000 mg | ORAL_TABLET | Freq: Two times a day (BID) | ORAL | 0 refills | Status: DC

## 2019-07-06 ENCOUNTER — Non-Acute Institutional Stay (SKILLED_NURSING_FACILITY): Payer: Medicare Other | Admitting: Internal Medicine

## 2019-07-06 ENCOUNTER — Encounter: Payer: Self-pay | Admitting: Internal Medicine

## 2019-07-06 DIAGNOSIS — Z7901 Long term (current) use of anticoagulants: Secondary | ICD-10-CM | POA: Diagnosis not present

## 2019-07-06 DIAGNOSIS — I8290 Acute embolism and thrombosis of unspecified vein: Secondary | ICD-10-CM

## 2019-07-06 NOTE — Progress Notes (Signed)
Location:  [facility] Room Number: 204-D Place of Service:  SNF (31)  Hennie Duos, MD  Patient Care Team: Hennie Duos, MD as PCP - General (Internal Medicine)  Extended Emergency Contact Information Primary Emergency Contact: Mowrer,[name] Address: [address]          [place], [place] [address] [name] of [place] Phone: [phone] Mobile Phone: [phone] Relation: Son Secondary Emergency Contact: [place] of [place] Phone: [phone] Relation: Daughter    Allergies: Ambien [zolpidem tartrate], Codeine, Codeine phosphate, Darvocet [propoxyphene n-acetaminophen], Fish allergy, Fish-derived products, Hydrocodone, Promethazine, Promethazine hcl, Sulfamethoxazole, Vicodin [hydrocodone-acetaminophen], Amoxicillin, and Penicillins  Chief Complaint  Patient presents with  . Acute Visit    Anticoagulation monitoring    HPI: Patient is a 85 y.o. female who is being seen for Coumadin check.  Patient's INR is 2.6.  Patient is on Coumadin 2.5 mg Monday Wednesday Friday and 2 mg on Tuesday Thursday Saturday Sunday.  Patient has no complaints.  Past Medical History:  Diagnosis Date  . Anemia 03/02/2012  . Angina pectoris, unstable ([place]) 03/02/2012  . Anxiety   . Axillary abscess   . Bradycardia 06/13/2012  . CAD (coronary artery disease)    S/p PTCA / stenting (last cath 2004, multiple LAD stents, 2 stents in the right coronary artery all patent)  . Cancer ([place])   . Complication of anesthesia   . Compression fracture of L2 ([place]) 03/12/2017  . Dementia ([place])   . Dysphagia 10/06/2012  . Dyspnea 02/03/2012  . GERD (gastroesophageal reflux disease)   . History of pulmonary embolus (PE) 03/19/2017  . HTN (hypertension)   . PMR (polymyalgia rheumatica) (HCC)   . PONV (postoperative nausea and vomiting)   . Restless leg syndrome   . 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 Started coumadin   . Weakness of both legs 06/09/2012    Past Surgical History:  Procedure Laterality Date  . BREAST LUMPECTOMY    . COLECTOMY    . ESOPHAGOGASTRODUODENOSCOPY (EGD) WITH ESOPHAGEAL DILATION  10/10/2012   Procedure: ESOPHAGOGASTRODUODENOSCOPY (EGD) WITH ESOPHAGEAL DILATION;  Surgeon: Inda Castle, MD;  Location: [facility];  Service: Endoscopy;  Laterality: N/A;  . FEMUR IM NAIL Left 12/13/2018   Procedure: INTRAMEDULLARY (IM) NAIL FEMORAL LEFT;  Surgeon: Paralee Cancel, MD;  [facility];  Service: Orthopedics;  Laterality: Left;  . heart stents  x 8  . INCISION AND DRAINAGE     bilateral axillary, non specific staff  . INCISION AND DRAINAGE ABSCESS  09/28/2012   Procedure: INCISION AND DRAINAGE ABSCESS;  Surgeon: Zenovia Jarred, MD;  Location: [facility];  Service: General;  Laterality: Bilateral;  . stent     cardiac x 8 stents.    Allergies as of 07/06/2019      Reactions   Ambien [zolpidem Tartrate] Nausea And Vomiting   Codeine    Codeine Phosphate Nausea And Vomiting   Darvocet [propoxyphene N-acetaminophen] Nausea And Vomiting   Fish Allergy    Fish-derived Products    Hydrocodone    Promethazine    Promethazine Hcl Nausea And Vomiting   Sulfamethoxazole Other (See Comments)   Pt doesn't remember reaction   Vicodin [hydrocodone-acetaminophen] Other (See Comments)   Unknown reaction   Amoxicillin Nausea And Vomiting, Rash   Penicillins Nausea And Vomiting, Rash      Medication List       Accurate as of July 06, 2019  1:32 PM. If you have any questions, ask your nurse or doctor.        acetaminophen 325 MG tablet Commonly known as: TYLENOL Take 650 mg by mouth every 6 (six) hours as needed for mild pain, moderate pain or fever.   acetaminophen 325 MG tablet Commonly known as: TYLENOL Take 650 mg by mouth every 6 (six) hours. Take routinely for left hip pain   ALPRAZolam 0.5 MG tablet Commonly known as: XANAX Take 1 tablet (0.5 mg total) by mouth daily.    amLODipine 10 MG tablet Commonly known as: NORVASC Take 10 mg by mouth every morning.   aspirin EC 81 MG tablet Take 1 tablet (81 mg total) by mouth daily.   atorvastatin 10 MG tablet Commonly known as: LIPITOR Take 10 mg by mouth every evening.   donepezil 10 MG tablet Commonly known as: ARICEPT Take 10 mg by mouth every evening.   losartan 50 MG tablet Commonly known as: COZAAR Take 50 mg by mouth at bedtime.   nitroGLYCERIN 0.4 MG SL tablet Commonly known as: NITROSTAT Place 0.4 mg under the tongue every 5 (five) minutes as needed for chest pain.   NON FORMULARY Diet Type:  Liberalized due to poor appetite to Mech soft   NUTRITIONAL SUPPLEMENT PO Take 1 each by mouth 2 (two) times a day. Magic Cup - take with lunch and dinner   ondansetron 4 MG tablet Commonly known as: ZOFRAN Take 4 mg by mouth every 4 (four) hours as needed for nausea.   oxybutynin 5 MG tablet Commonly known as: DITROPAN Take 5 mg by mouth 2 (two) times daily.   polyethylene glycol 17 g packet Commonly known as: MIRALAX / GLYCOLAX Take 17 g by mouth daily as needed for moderate constipation.   predniSONE 1 MG tablet Commonly known as: DELTASONE Take 1 mg by mouth daily with breakfast.   ProAir HFA 108 (90 Base) MCG/ACT inhaler Generic drug: albuterol Inhale 2 puffs into the lungs every 6 (six) hours as needed for wheezing or shortness of breath.   rOPINIRole 1 MG tablet Commonly known as: REQUIP Take 1 mg by mouth See admin instructions. 1mg  at noon, 1mg  2 hours before bed, then 1mg  at bedtime   senna-docusate 8.6-50 MG tablet Commonly known as: Senokot-S Take 1 tablet by mouth 2 (two) times daily.   Silenor 6 MG Tabs Generic drug: Doxepin HCl Take 0.5 tablets (3 mg total) by mouth at bedtime.   traMADol 50 MG tablet Commonly known as: ULTRAM Take 1 tablet (50 mg total) by mouth 2 (two) times daily.   Tums 500 MG chewable tablet Generic drug: calcium carbonate Chew 1 tablet by  mouth 3 (three) times daily as needed for indigestion or heartburn.   Vitamin D3 1.25 MG (50000 UT) Caps Take 1 capsule by mouth once a week.   warfarin 2.5 MG tablet Commonly known as: COUMADIN Take 2.5 mg by mouth every Monday, Wednesday, and Friday.   warfarin 2 MG tablet Commonly known as: COUMADIN Take 2 mg by mouth See admin instructions. Give Tue, Thur, Sat, Sun       No orders of the defined types were placed in this encounter.   Immunization History  Administered Date(s) Administered  . Influenza-Unspecified 08/01/2016, 08/11/2017, 07/02/2018  . PPD Test 03/18/2017  . Pneumococcal Conjugate-13 09/17/2017  . Pneumococcal Polysaccharide-23 04/17/2019    Social History   Tobacco Use  . Smoking status: Never Smoker  . Smokeless tobacco: Never Used  Substance Use  Topics  . Alcohol use: No     Vitals:   07/06/19 1149  BP: 125/66  Pulse: 80  Resp: 20  Temp: (!) 97.4 F (36.3 C)   Body mass index is 23.58 kg/m.   Patient Active Problem List   Diagnosis Date Noted  . Failure to thrive in adult 01/08/2019  . Moderate protein malnutrition ([place]) 01/08/2019  . Pressure injury of skin 12/18/2018  . Intertrochanteric fracture of left femur ([place]) 12/11/2018  . CKD (chronic kidney disease), stage III ([place]) 12/11/2018  . Depression with anxiety 12/11/2018  . Overactive bladder 09/24/2017  . Insomnia 07/31/2017  . Anxiety 06/16/2017  . Citrobacter infection 03/19/2017  . AKI (acute kidney injury) ([place]) 03/19/2017  . History of pulmonary embolus (PE) 03/19/2017  . Elevated INR 03/19/2017  . Candidal intertrigo 03/19/2017  . Compression fracture of L2 ([place]) 03/12/2017  . Acute lower UTI 03/12/2017  . Accelerated hypertension 03/12/2017  . Leukocytosis 07/31/2015  . Acute bronchitis 07/28/2015  . Chronic anticoagulation 07/28/2015  . Intractable back pain 07/20/2015  . Back pain 07/20/2015  . Cellulitis and abscess of left buttock 04/12/2013  . Stricture  and stenosis of esophagus 10/10/2012  . Dementia ([place]) 10/06/2012  . Dysphagia 10/06/2012  . Axillary abscess - bilateral, multiple 09/27/2012  . Hypotension 08/24/2012  . Near syncope 08/24/2012  . Abscess of axilla, left 06/27/2012  . Bradycardia 06/13/2012  . Abscess 06/11/2012  . Chest pain 06/09/2012  . Cellulitis 06/09/2012  . Weakness of both legs 06/09/2012  . PMR (polymyalgia rheumatica) (HCC)   . Restless leg syndrome   . Angina pectoris, unstable ([place]) 03/02/2012  . Acute blood loss as cause of postoperative anemia 03/02/2012  . Dyspnea 02/03/2012  . UTI (urinary tract infection) 02/03/2012  . CAD (coronary artery disease)   . HTN (hypertension)   . VTE (venous thromboembolism) 10/01/2010  . HYPERCHOLESTEROLEMIA  IIA 08/27/2009  . Hyperlipidemia 12/11/2008  . HYPERTENSION, BENIGN 12/11/2008    CMP     Component Value Date/Time   NA 141 06/12/2019   K 4.3 06/12/2019   CL 105 12/17/2018 0417   CO2 22 12/17/2018 0417   GLUCOSE 104 (H) 12/17/2018 0417   BUN 25 (A) 06/12/2019   CREATININE 0.8 06/12/2019   CREATININE 0.99 12/17/2018 0417   CALCIUM 7.7 (L) 12/17/2018 0417   PROT 6.7 03/11/2017 2031   ALBUMIN 3.4 (L) 03/11/2017 2031   AST 14 06/12/2019   ALT 12 06/12/2019   ALKPHOS 49 06/12/2019   BILITOT 0.5 03/11/2017 2031   GFRNONAA 48 (L) 12/17/2018 0417   GFRAA 56 (L) 12/17/2018 0417   Recent Labs    12/14/18 0536 12/15/18 0911 12/16/18 0611 12/17/18 0417 12/27/18 06/12/19  NA 138 138 136 135 142 141  K 3.9 3.4* 4.0 4.1 3.6 4.3  CL 110 108 108 105  --   --   CO2 22 23 22 22   --   --   GLUCOSE 144* 85 96 104*  --   --   BUN 25* 25* 21 20 32* 25*  CREATININE 1.13* 0.81 0.86 0.99 0.9 0.8  CALCIUM 8.3* 7.8* 7.8* 7.7*  --   --   MG 2.1 2.3 2.2  --   --   --    Recent Labs    08/01/18 06/12/19  AST 9* 14  ALT 7 12  ALKPHOS 58 49   Recent Labs    12/16/18 0611 12/17/18 0417 12/18/18 0408 12/27/18 06/12/19  WBC 7.4 7.3 8.2 12.2  7.0  NEUTROABS   --  4.7 5.3  --  3  HGB 7.3* 9.4* 9.5* 9.0* 11.1*  HCT 24.5* 31.3* 31.4* 28* 35*  MCV 83.3 83.2 83.3  --   --   PLT 282 265 257 404* 279   Recent Labs    08/01/18  CHOL 140  LDLCALC 80  TRIG 82   No results found for: [facility] Lab Results  Component Value Date   TSH 1.71 08/01/2018   Lab Results  Component Value Date   HGBA1C 5.7 08/01/2018   Lab Results  Component Value Date   CHOL 140 08/01/2018   HDL 46 08/01/2018   LDLCALC 80 08/01/2018   TRIG 82 08/01/2018   CHOLHDL 2.4 03/03/2012    Significant Diagnostic Results in last 30 days:  No results found.  Assessment and Plan   History of PE/DVT encounter for Coumadin use- patient's INR is 2.6 which is exactly perfect, she is on 2.5 mg Monday Wednesday Friday and 2 mg every other day.  Plan to continue current regimen and recheck in 1 week    Hennie Duos , MD

## 2019-07-06 NOTE — Progress Notes (Deleted)
Location:  [facility] Room Number: 204-D Place of Service:  SNF (31)  Hennie Duos, MD  Patient Care Team: Hennie Duos, MD as PCP - General (Internal Medicine)  Extended Emergency Contact Information Primary Emergency Contact: Westervelt,[name] Address: [address]          [place], [place] [address] [name] of [place] Phone: [phone] Mobile Phone: [phone] Relation: Son Secondary Emergency Contact: Luis M. Cintron of [place] Phone: [phone] Relation: Daughter    Allergies: Ambien [zolpidem tartrate], Codeine, Codeine phosphate, Darvocet [propoxyphene n-acetaminophen], Fish allergy, Fish-derived products, Hydrocodone, Promethazine, Promethazine hcl, Sulfamethoxazole, Vicodin [hydrocodone-acetaminophen], Amoxicillin, and Penicillins  Chief Complaint  Patient presents with  . Medical Management of Chronic Issues    Routine [name] visit    HPI: Patient is a 85 y.o. female who   Past Medical History:  Diagnosis Date  . Anemia 03/02/2012  . Angina pectoris, unstable ([place]) 03/02/2012  . Anxiety   . Axillary abscess   . Bradycardia 06/13/2012  . CAD (coronary artery disease)    S/p PTCA / stenting (last cath 2004, multiple LAD stents, 2 stents in the right coronary artery all patent)  . Cancer ([place])   . Complication of anesthesia   . Compression fracture of L2 ([place]) 03/12/2017  . Dementia ([place])   . Dysphagia 10/06/2012  . Dyspnea 02/03/2012  . GERD (gastroesophageal reflux disease)   . History of pulmonary embolus (PE) 03/19/2017  . HTN (hypertension)   . PMR (polymyalgia rheumatica) (HCC)   . PONV (postoperative nausea and vomiting)   . Restless leg syndrome   . 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 Started coumadin  . Weakness of both legs 06/09/2012    Past Surgical History:  Procedure Laterality Date  . BREAST LUMPECTOMY    . COLECTOMY    .  ESOPHAGOGASTRODUODENOSCOPY (EGD) WITH ESOPHAGEAL DILATION  10/10/2012   Procedure: ESOPHAGOGASTRODUODENOSCOPY (EGD) WITH ESOPHAGEAL DILATION;  Surgeon: Inda Castle, MD;  Location: [facility];  Service: Endoscopy;  Laterality: N/A;  . FEMUR IM NAIL Left 12/13/2018   Procedure: INTRAMEDULLARY (IM) NAIL FEMORAL LEFT;  Surgeon: Paralee Cancel, MD;  [facility];  Service: Orthopedics;  Laterality: Left;  . heart stents  x 8  . INCISION AND DRAINAGE     bilateral axillary, non specific staff  . INCISION AND DRAINAGE ABSCESS  09/28/2012   Procedure: INCISION AND DRAINAGE ABSCESS;  Surgeon: Zenovia Jarred, MD;  Location: [facility];  Service: General;  Laterality: Bilateral;  . stent     cardiac x 8 stents.    Allergies as of 07/06/2019      Reactions   Ambien [zolpidem Tartrate] Nausea And Vomiting   Codeine    Codeine Phosphate Nausea And Vomiting   Darvocet [propoxyphene N-acetaminophen] Nausea And Vomiting   Fish Allergy    Fish-derived Products    Hydrocodone    Promethazine    Promethazine Hcl Nausea And Vomiting   Sulfamethoxazole Other (See Comments)   Pt doesn't remember reaction   Vicodin [hydrocodone-acetaminophen] Other (See Comments)   Unknown reaction   Amoxicillin Nausea And Vomiting, Rash   Penicillins Nausea And Vomiting, Rash      Medication List       Accurate as of July 06, 2019 12:03 PM. If you have any questions, ask your nurse or doctor.        acetaminophen 325 MG tablet Commonly known as: TYLENOL Take  650 mg by mouth every 6 (six) hours as needed for mild pain, moderate pain or fever.   acetaminophen 325 MG tablet Commonly known as: TYLENOL Take 650 mg by mouth every 6 (six) hours. Take routinely for left hip pain   ALPRAZolam 0.5 MG tablet Commonly known as: XANAX Take 1 tablet (0.5 mg total) by mouth daily.   amLODipine 10 MG tablet Commonly known as: NORVASC Take 10 mg by mouth every morning.   aspirin EC 81 MG tablet Take 1 tablet  (81 mg total) by mouth daily.   atorvastatin 10 MG tablet Commonly known as: LIPITOR Take 10 mg by mouth every evening.   donepezil 10 MG tablet Commonly known as: ARICEPT Take 10 mg by mouth every evening.   losartan 50 MG tablet Commonly known as: COZAAR Take 50 mg by mouth at bedtime.   nitroGLYCERIN 0.4 MG SL tablet Commonly known as: NITROSTAT Place 0.4 mg under the tongue every 5 (five) minutes as needed for chest pain.   NON FORMULARY Diet Type:  Liberalized due to poor appetite to Mech soft   NUTRITIONAL SUPPLEMENT PO Take 1 each by mouth 2 (two) times a day. Magic Cup - take with lunch and dinner   ondansetron 4 MG tablet Commonly known as: ZOFRAN Take 4 mg by mouth every 4 (four) hours as needed for nausea.   oxybutynin 5 MG tablet Commonly known as: DITROPAN Take 5 mg by mouth 2 (two) times daily.   polyethylene glycol 17 g packet Commonly known as: MIRALAX / GLYCOLAX Take 17 g by mouth daily as needed for moderate constipation.   predniSONE 1 MG tablet Commonly known as: DELTASONE Take 1 mg by mouth daily with breakfast.   ProAir HFA 108 (90 Base) MCG/ACT inhaler Generic drug: albuterol Inhale 2 puffs into the lungs every 6 (six) hours as needed for wheezing or shortness of breath.   rOPINIRole 1 MG tablet Commonly known as: REQUIP Take 1 mg by mouth See admin instructions. 1mg  at noon, 1mg  2 hours before bed, then 1mg  at bedtime   senna-docusate 8.6-50 MG tablet Commonly known as: Senokot-S Take 1 tablet by mouth 2 (two) times daily.   Silenor 6 MG Tabs Generic drug: Doxepin HCl Take 0.5 tablets (3 mg total) by mouth at bedtime.   traMADol 50 MG tablet Commonly known as: ULTRAM Take 1 tablet (50 mg total) by mouth 2 (two) times daily.   Tums 500 MG chewable tablet Generic drug: calcium carbonate Chew 1 tablet by mouth 3 (three) times daily as needed for indigestion or heartburn.   Vitamin D3 1.25 MG (50000 UT) Caps Take 1 capsule by mouth  once a week.   warfarin 2.5 MG tablet Commonly known as: COUMADIN Take 2.5 mg by mouth every Monday, Wednesday, and Friday.   warfarin 2 MG tablet Commonly known as: COUMADIN Take 2 mg by mouth See admin instructions. Give Tue, Thur, Sat, Sun       No orders of the defined types were placed in this encounter.   Immunization History  Administered Date(s) Administered  . Influenza-Unspecified 08/01/2016, 08/11/2017, 07/02/2018  . PPD Test 03/18/2017  . Pneumococcal Conjugate-13 09/17/2017  . Pneumococcal Polysaccharide-23 04/17/2019    Social History   Tobacco Use  . Smoking status: Never Smoker  . Smokeless tobacco: Never Used  Substance Use Topics  . Alcohol use: No    Review of Systems  DATA OBTAINED: from patient, nurse, medical record, family member GENERAL:  no fevers, fatigue, appetite  changes SKIN: No itching, rash HEENT: No complaint RESPIRATORY: No cough, wheezing, SOB CARDIAC: No chest pain, palpitations, lower extremity edema  GI: No abdominal pain, No N/V/D or constipation, No heartburn or reflux  GU: No dysuria, frequency or urgency, or incontinence  MUSCULOSKELETAL: No unrelieved bone/joint pain NEUROLOGIC: No headache, dizziness  PSYCHIATRIC: No overt anxiety or sadness  Vitals:   07/06/19 1149  BP: 125/66  Pulse: 80  Resp: 20  Temp: (!) 97.4 F (36.3 C)   Body mass index is 23.58 kg/m. Physical Exam  GENERAL APPEARANCE: Alert, conversant, No acute distress  SKIN: No diaphoresis rash HEENT: Unremarkable RESPIRATORY: Breathing is even, unlabored. Lung sounds are clear   CARDIOVASCULAR: Heart RRR no murmurs, rubs or gallops. No peripheral edema  GASTROINTESTINAL: Abdomen is soft, non-tender, not distended w/ normal bowel sounds.  GENITOURINARY: Bladder non tender, not distended  MUSCULOSKELETAL: No abnormal joints or musculature NEUROLOGIC: Cranial nerves 2-12 grossly intact. Moves all extremities PSYCHIATRIC: Mood and affect appropriate  to situation, no behavioral issues  Patient Active Problem List   Diagnosis Date Noted  . Failure to thrive in adult 01/08/2019  . Moderate protein malnutrition ([place]) 01/08/2019  . Pressure injury of skin 12/18/2018  . Intertrochanteric fracture of left femur ([place]) 12/11/2018  . CKD (chronic kidney disease), stage III ([place]) 12/11/2018  . Depression with anxiety 12/11/2018  . Overactive bladder 09/24/2017  . Insomnia 07/31/2017  . Anxiety 06/16/2017  . Citrobacter infection 03/19/2017  . AKI (acute kidney injury) ([place]) 03/19/2017  . History of pulmonary embolus (PE) 03/19/2017  . Elevated INR 03/19/2017  . Candidal intertrigo 03/19/2017  . Compression fracture of L2 ([place]) 03/12/2017  . Acute lower UTI 03/12/2017  . Accelerated hypertension 03/12/2017  . Leukocytosis 07/31/2015  . Acute bronchitis 07/28/2015  . Chronic anticoagulation 07/28/2015  . Intractable back pain 07/20/2015  . Back pain 07/20/2015  . Cellulitis and abscess of left buttock 04/12/2013  . Stricture and stenosis of esophagus 10/10/2012  . Dementia ([place]) 10/06/2012  . Dysphagia 10/06/2012  . Axillary abscess - bilateral, multiple 09/27/2012  . Hypotension 08/24/2012  . Near syncope 08/24/2012  . Abscess of axilla, left 06/27/2012  . Bradycardia 06/13/2012  . Abscess 06/11/2012  . Chest pain 06/09/2012  . Cellulitis 06/09/2012  . Weakness of both legs 06/09/2012  . PMR (polymyalgia rheumatica) (HCC)   . Restless leg syndrome   . Angina pectoris, unstable ([place]) 03/02/2012  . Acute blood loss as cause of postoperative anemia 03/02/2012  . Dyspnea 02/03/2012  . UTI (urinary tract infection) 02/03/2012  . CAD (coronary artery disease)   . HTN (hypertension)   . VTE (venous thromboembolism) 10/01/2010  . HYPERCHOLESTEROLEMIA  IIA 08/27/2009  . Hyperlipidemia 12/11/2008  . HYPERTENSION, BENIGN 12/11/2008    CMP     Component Value Date/Time   NA 141 06/12/2019   K 4.3 06/12/2019   CL 105 12/17/2018  0417   CO2 22 12/17/2018 0417   GLUCOSE 104 (H) 12/17/2018 0417   BUN 25 (A) 06/12/2019   CREATININE 0.8 06/12/2019   CREATININE 0.99 12/17/2018 0417   CALCIUM 7.7 (L) 12/17/2018 0417   PROT 6.7 03/11/2017 2031   ALBUMIN 3.4 (L) 03/11/2017 2031   AST 14 06/12/2019   ALT 12 06/12/2019   ALKPHOS 49 06/12/2019   BILITOT 0.5 03/11/2017 2031   GFRNONAA 48 (L) 12/17/2018 0417   GFRAA 56 (L) 12/17/2018 0417   Recent Labs    12/14/18 0536 12/15/18 0911 12/16/18 [id number] 12/17/18 0417 12/27/18 06/12/19  NA 138 138 136 135 142 141  K 3.9 3.4* 4.0 4.1 3.6 4.3  CL 110 108 108 105  --   --   CO2 22 23 22 22   --   --   GLUCOSE 144* 85 96 104*  --   --   BUN 25* 25* 21 20 32* 25*  CREATININE 1.13* 0.81 0.86 0.99 0.9 0.8  CALCIUM 8.3* 7.8* 7.8* 7.7*  --   --   MG 2.1 2.3 2.2  --   --   --    Recent Labs    08/01/18 06/12/19  AST 9* 14  ALT 7 12  ALKPHOS 58 49   Recent Labs    12/16/18 0611 12/17/18 0417 12/18/18 0408 12/27/18 06/12/19  WBC 7.4 7.3 8.2 12.2 7.0  NEUTROABS  --  4.7 5.3  --  3  HGB 7.3* 9.4* 9.5* 9.0* 11.1*  HCT 24.5* 31.3* 31.4* 28* 35*  MCV 83.3 83.2 83.3  --   --   PLT 282 265 257 404* 279   Recent Labs    08/01/18  CHOL 140  LDLCALC 80  TRIG 82   No results found for: [facility] Lab Results  Component Value Date   TSH 1.71 08/01/2018   Lab Results  Component Value Date   HGBA1C 5.7 08/01/2018   Lab Results  Component Value Date   CHOL 140 08/01/2018   HDL 46 08/01/2018   LDLCALC 80 08/01/2018   TRIG 82 08/01/2018   CHOLHDL 2.4 03/03/2012    Significant Diagnostic Results in last 30 days:  No results found.  Assessment and Plan  No problem-specific Assessment & Plan notes found for this encounter.   Labs/tests ordered:    Hennie Duos, MD

## 2019-07-09 ENCOUNTER — Encounter: Payer: Self-pay | Admitting: Internal Medicine

## 2019-07-13 ENCOUNTER — Non-Acute Institutional Stay (SKILLED_NURSING_FACILITY): Payer: Medicare Other | Admitting: Internal Medicine

## 2019-07-13 DIAGNOSIS — Z7901 Long term (current) use of anticoagulants: Secondary | ICD-10-CM | POA: Diagnosis not present

## 2019-07-13 DIAGNOSIS — I825Y9 Chronic embolism and thrombosis of unspecified deep veins of unspecified proximal lower extremity: Secondary | ICD-10-CM

## 2019-07-13 DIAGNOSIS — Z5181 Encounter for therapeutic drug level monitoring: Secondary | ICD-10-CM | POA: Diagnosis not present

## 2019-07-16 ENCOUNTER — Encounter: Payer: Self-pay | Admitting: Internal Medicine

## 2019-07-16 NOTE — Progress Notes (Signed)
Location:  [facility] Room Number: 204-D Place of Service:  SNF (31)  Hennie Duos, MD  Patient Care Team: Hennie Duos, MD as PCP - General (Internal Medicine)  Extended Emergency Contact Information Primary Emergency Contact: Jaeger,[name] Address: [address]          [place], [place] [address] [name] of [place] Phone: [phone] Mobile Phone: [phone] Relation: Son Secondary Emergency Contact: Daggett of [place] Phone: [phone] Relation: Daughter    Allergies: Ambien [zolpidem tartrate], Codeine, Codeine phosphate, Darvocet [propoxyphene n-acetaminophen], Fish allergy, Fish-derived products, Hydrocodone, Promethazine, Promethazine hcl, Sulfamethoxazole, Vicodin [hydrocodone-acetaminophen], Amoxicillin, and Penicillins  Chief Complaint  Patient presents with  . Acute Visit    Anticoagulation management    HPI: Patient is a 85 y.o. female who who is being seen for chronic Coumadin management.  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  There are no complaints are reported problems.  Past Medical History:  Diagnosis Date  . Anemia 03/02/2012  . Angina pectoris, unstable ([place]) 03/02/2012  . Anxiety   . Axillary abscess   . Bradycardia 06/13/2012  . CAD (coronary artery disease)    S/p PTCA / stenting (last cath 2004, multiple LAD stents, 2 stents in the right coronary artery all patent)  . Cancer ([place])   . Complication of anesthesia   . Compression fracture of L2 ([place]) 03/12/2017  . Dementia ([place])   . Dysphagia 10/06/2012  . Dyspnea 02/03/2012  . GERD (gastroesophageal reflux disease)   . History of pulmonary embolus (PE) 03/19/2017  . HTN (hypertension)   . PMR (polymyalgia rheumatica) (HCC)   . PONV (postoperative nausea and vomiting)   . Restless leg syndrome   . 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 Started coumadin  . Weakness of both legs  06/09/2012    Past Surgical History:  Procedure Laterality Date  . BREAST LUMPECTOMY    . COLECTOMY    . ESOPHAGOGASTRODUODENOSCOPY (EGD) WITH ESOPHAGEAL DILATION  10/10/2012   Procedure: ESOPHAGOGASTRODUODENOSCOPY (EGD) WITH ESOPHAGEAL DILATION;  Surgeon: Inda Castle, MD;  Location: [facility];  Service: Endoscopy;  Laterality: N/A;  . FEMUR IM NAIL Left 12/13/2018   Procedure: INTRAMEDULLARY (IM) NAIL FEMORAL LEFT;  Surgeon: Paralee Cancel, MD;  [facility];  Service: Orthopedics;  Laterality: Left;  . heart stents  x 8  . INCISION AND DRAINAGE     bilateral axillary, non specific staff  . INCISION AND DRAINAGE ABSCESS  09/28/2012   Procedure: INCISION AND DRAINAGE ABSCESS;  Surgeon: Zenovia Jarred, MD;  Location: [facility];  Service: General;  Laterality: Bilateral;  . stent     cardiac x 8 stents.    Allergies as of 07/13/2019      Reactions   Ambien [zolpidem Tartrate] Nausea And Vomiting   Codeine    Codeine Phosphate Nausea And Vomiting   Darvocet [propoxyphene N-acetaminophen] Nausea And Vomiting   Fish Allergy    Fish-derived Products    Hydrocodone    Promethazine    Promethazine Hcl Nausea And Vomiting   Sulfamethoxazole Other (See Comments)   Pt doesn't remember reaction   Vicodin [hydrocodone-acetaminophen] Other (See Comments)   Unknown reaction   Amoxicillin Nausea And Vomiting, Rash   Penicillins Nausea And Vomiting, Rash      Medication List       Accurate as of July 13, 2019 11:59 PM. If you have any questions, ask  your nurse or doctor.        acetaminophen 325 MG tablet Commonly known as: TYLENOL Take 650 mg by mouth every 6 (six) hours as needed for mild pain, moderate pain or fever.   acetaminophen 325 MG tablet Commonly known as: TYLENOL Take 650 mg by mouth every 6 (six) hours. Take routinely for left hip pain   ALPRAZolam 0.5 MG tablet Commonly known as: XANAX Take 1 tablet (0.5 mg total) by mouth daily.   amLODipine 10 MG  tablet Commonly known as: NORVASC Take 10 mg by mouth every morning.   aspirin EC 81 MG tablet Take 1 tablet (81 mg total) by mouth daily.   atorvastatin 10 MG tablet Commonly known as: LIPITOR Take 10 mg by mouth every evening.   donepezil 10 MG tablet Commonly known as: ARICEPT Take 10 mg by mouth every evening.   losartan 50 MG tablet Commonly known as: COZAAR Take 50 mg by mouth at bedtime.   nitroGLYCERIN 0.4 MG SL tablet Commonly known as: NITROSTAT Place 0.4 mg under the tongue every 5 (five) minutes as needed for chest pain.   NON FORMULARY Diet Type:  Liberalized due to poor appetite to Mech soft   NUTRITIONAL SUPPLEMENT PO Take 1 each by mouth 2 (two) times a day. Magic Cup - take with lunch and dinner   ondansetron 4 MG tablet Commonly known as: ZOFRAN Take 4 mg by mouth every 4 (four) hours as needed for nausea.   oxybutynin 5 MG tablet Commonly known as: DITROPAN Take 5 mg by mouth 2 (two) times daily.   polyethylene glycol 17 g packet Commonly known as: MIRALAX / GLYCOLAX Take 17 g by mouth daily as needed for moderate constipation.   predniSONE 1 MG tablet Commonly known as: DELTASONE Take 1 mg by mouth daily with breakfast.   ProAir HFA 108 (90 Base) MCG/ACT inhaler Generic drug: albuterol Inhale 2 puffs into the lungs every 6 (six) hours as needed for wheezing or shortness of breath.   rOPINIRole 1 MG tablet Commonly known as: REQUIP Take 1 mg by mouth See admin instructions. 1mg  at noon, 1mg  2 hours before bed, then 1mg  at bedtime   senna-docusate 8.6-50 MG tablet Commonly known as: Senokot-S Take 1 tablet by mouth 2 (two) times daily.   Silenor 6 MG Tabs Generic drug: Doxepin HCl Take 0.5 tablets (3 mg total) by mouth at bedtime.   traMADol 50 MG tablet Commonly known as: ULTRAM Take 1 tablet (50 mg total) by mouth 2 (two) times daily.   Tums 500 MG chewable tablet Generic drug: calcium carbonate Chew 1 tablet by mouth 3 (three)  times daily as needed for indigestion or heartburn.   Vitamin D3 1.25 MG (50000 UT) Caps Take 1 capsule by mouth once a week.   warfarin 2.5 MG tablet Commonly known as: COUMADIN Take 2.5 mg by mouth every Monday, Wednesday, and Friday.   warfarin 2 MG tablet Commonly known as: COUMADIN Take 2 mg by mouth See admin instructions. Give Tue, Thur, Sat, Sun       No orders of the defined types were placed in this encounter.   Immunization History  Administered Date(s) Administered  . Influenza-Unspecified 08/01/2016, 08/11/2017, 07/02/2018  . PPD Test 03/18/2017  . Pneumococcal Conjugate-13 09/17/2017  . Pneumococcal Polysaccharide-23 04/17/2019    Social History   Tobacco Use  . Smoking status: Never Smoker  . Smokeless tobacco: Never Used  Substance Use Topics  . Alcohol use: No  Vitals:   07/13/19 1144  BP: 128/67  Pulse: 93  Resp: 18  Temp: (!) 97.1 F (36.2 C)   Body mass index is 24.3 kg/m.   Patient Active Problem List   Diagnosis Date Noted  . Failure to thrive in adult 01/08/2019  . Moderate protein malnutrition ([place]) 01/08/2019  . Pressure injury of skin 12/18/2018  . Intertrochanteric fracture of left femur ([place]) 12/11/2018  . CKD (chronic kidney disease), stage III ([place]) 12/11/2018  . Depression with anxiety 12/11/2018  . Overactive bladder 09/24/2017  . Insomnia 07/31/2017  . Anxiety 06/16/2017  . Citrobacter infection 03/19/2017  . AKI (acute kidney injury) ([place]) 03/19/2017  . History of pulmonary embolus (PE) 03/19/2017  . Elevated INR 03/19/2017  . Candidal intertrigo 03/19/2017  . Compression fracture of L2 ([place]) 03/12/2017  . Acute lower UTI 03/12/2017  . Accelerated hypertension 03/12/2017  . Leukocytosis 07/31/2015  . Acute bronchitis 07/28/2015  . Chronic anticoagulation 07/28/2015  . Intractable back pain 07/20/2015  . Back pain 07/20/2015  . Cellulitis and abscess of left buttock 04/12/2013  . Stricture and stenosis of  esophagus 10/10/2012  . Dementia ([place]) 10/06/2012  . Dysphagia 10/06/2012  . Axillary abscess - bilateral, multiple 09/27/2012  . Hypotension 08/24/2012  . Near syncope 08/24/2012  . Abscess of axilla, left 06/27/2012  . Bradycardia 06/13/2012  . Abscess 06/11/2012  . Chest pain 06/09/2012  . Cellulitis 06/09/2012  . Weakness of both legs 06/09/2012  . PMR (polymyalgia rheumatica) (HCC)   . Restless leg syndrome   . Angina pectoris, unstable ([place]) 03/02/2012  . Acute blood loss as cause of postoperative anemia 03/02/2012  . Dyspnea 02/03/2012  . UTI (urinary tract infection) 02/03/2012  . CAD (coronary artery disease)   . HTN (hypertension)   . VTE (venous thromboembolism) 10/01/2010  . HYPERCHOLESTEROLEMIA  IIA 08/27/2009  . Hyperlipidemia 12/11/2008  . HYPERTENSION, BENIGN 12/11/2008    CMP     Component Value Date/Time   NA 141 06/12/2019   K 4.3 06/12/2019   CL 105 12/17/2018 0417   CO2 22 12/17/2018 0417   GLUCOSE 104 (H) 12/17/2018 0417   BUN 25 (A) 06/12/2019   CREATININE 0.8 06/12/2019   CREATININE 0.99 12/17/2018 0417   CALCIUM 7.7 (L) 12/17/2018 0417   PROT 6.7 03/11/2017 2031   ALBUMIN 3.4 (L) 03/11/2017 2031   AST 14 06/12/2019   ALT 12 06/12/2019   ALKPHOS 49 06/12/2019   BILITOT 0.5 03/11/2017 2031   GFRNONAA 48 (L) 12/17/2018 0417   GFRAA 56 (L) 12/17/2018 0417   Recent Labs    12/14/18 0536 12/15/18 0911 12/16/18 0611 12/17/18 0417 12/27/18 06/12/19  NA 138 138 136 135 142 141  K 3.9 3.4* 4.0 4.1 3.6 4.3  CL 110 108 108 105  --   --   CO2 22 23 22 22   --   --   GLUCOSE 144* 85 96 104*  --   --   BUN 25* 25* 21 20 32* 25*  CREATININE 1.13* 0.81 0.86 0.99 0.9 0.8  CALCIUM 8.3* 7.8* 7.8* 7.7*  --   --   MG 2.1 2.3 2.2  --   --   --    Recent Labs    08/01/18 06/12/19  AST 9* 14  ALT 7 12  ALKPHOS 58 49   Recent Labs    12/16/18 0611 12/17/18 0417 12/18/18 0408 12/27/18 06/12/19  WBC 7.4 7.3 8.2 12.2 7.0  NEUTROABS  --  4.7 5.3   --  3  HGB 7.3* 9.4* 9.5* 9.0* 11.1*  HCT 24.5* 31.3* 31.4* 28* 35*  MCV 83.3 83.2 83.3  --   --   PLT 282 265 257 404* 279   Recent Labs    08/01/18  CHOL 140  LDLCALC 80  TRIG 82   No results found for: [facility] Lab Results  Component Value Date   TSH 1.71 08/01/2018   Lab Results  Component Value Date   HGBA1C 5.7 08/01/2018   Lab Results  Component Value Date   CHOL 140 08/01/2018   HDL 46 08/01/2018   LDLCALC 80 08/01/2018   TRIG 82 08/01/2018   CHOLHDL 2.4 03/03/2012    Significant Diagnostic Results in last 30 days:  No results found.  Assessment and Plan  Chronic DVT/chronic Coumadin use- patient's INR is 2 which is therapeutic.  Patient is on 2.5 mg Monday Wednesday Friday and 2 mg on every other day.  We will continue current regimen and repeat in 1 week.    Hennie Duos, MD

## 2019-07-20 ENCOUNTER — Non-Acute Institutional Stay (SKILLED_NURSING_FACILITY): Payer: Medicare Other | Admitting: Internal Medicine

## 2019-07-20 DIAGNOSIS — Z86711 Personal history of pulmonary embolism: Secondary | ICD-10-CM | POA: Diagnosis not present

## 2019-07-20 DIAGNOSIS — R791 Abnormal coagulation profile: Secondary | ICD-10-CM

## 2019-07-23 ENCOUNTER — Encounter: Payer: Self-pay | Admitting: Internal Medicine

## 2019-07-23 NOTE — Patient Instructions (Signed)
Sent by way is some blindness or something

## 2019-07-23 NOTE — Progress Notes (Signed)
Location:  [facility] Room Number: 204-D Place of Service:  SNF ([id number]  Hennie Duos, MD  Patient Care Team: Hennie Duos, MD as PCP - General (Internal Medicine)  Extended Emergency Contact Information Primary Emergency Contact: Bieser,[name] Address: [address]          [place], [place] [address] [name] of [place] Phone: [phone] Mobile Phone: [phone] Relation: Son Secondary Emergency Contact: [place] of [place] Phone: [phone] Relation: Daughter    Allergies: Ambien [zolpidem tartrate], Codeine, Codeine phosphate, Darvocet [propoxyphene n-acetaminophen], Fish allergy, Fish-derived products, Hydrocodone, Promethazine, Promethazine hcl, Sulfamethoxazole, Vicodin [hydrocodone-acetaminophen], Amoxicillin, and Penicillins  Chief Complaint  Patient presents with  . Acute Visit    Patient seen for anticoagulation management    HPI: Patient is 85 y.o. female who is being seen for INR check.  Today INR is 3.8 with patient being on Coumadin 2.5 Monday Wednesday Friday and 2.0 Tuesday Thursday Saturday Sunday.  1 week ago INR was 2.0.  No complaints or signs of bleeding.  Past Medical History:  Diagnosis Date  . Anemia 03/02/2012  . Angina pectoris, unstable ([facility]) 03/02/2012  . Anxiety   . Axillary abscess   . Bradycardia 06/13/2012  . CAD (coronary artery disease)    S/p PTCA / stenting (last cath 2004, multiple LAD stents, 2 stents in the right coronary artery all patent)  . Cancer ([facility])   . Complication of anesthesia   . Compression fracture of L2 ([facility]) 03/12/2017  . Dementia ([place])   . Dysphagia 10/06/2012  . Dyspnea 02/03/2012  . GERD (gastroesophageal reflux disease)   . History of pulmonary embolus (PE) 03/19/2017  . HTN (hypertension)   . PMR (polymyalgia rheumatica) (HCC)   . PONV (postoperative nausea and vomiting)   . Restless leg syndrome   . VTE (venous  thromboembolism) 10/2010   DVT and PE. Started coumadin  . Weakness of both legs 06/09/2012    Past Surgical History:  Procedure Laterality Date  . BREAST LUMPECTOMY    . COLECTOMY    . ESOPHAGOGASTRODUODENOSCOPY (EGD) WITH ESOPHAGEAL DILATION  10/10/2012   Procedure: ESOPHAGOGASTRODUODENOSCOPY (EGD) WITH ESOPHAGEAL DILATION;  Surgeon: Inda Castle, MD;  Location: [facility];  Service: Endoscopy;  Laterality: N/A;  . FEMUR IM NAIL Left 12/13/2018   Procedure: INTRAMEDULLARY (IM) NAIL FEMORAL LEFT;  Surgeon: Paralee Cancel, MD;  Location: [facility];  Service: Orthopedics;  Laterality: Left;  . heart stents  x 8  . INCISION AND DRAINAGE     bilateral axillary, non specific staff  . INCISION AND DRAINAGE ABSCESS  09/28/2012   Procedure: INCISION AND DRAINAGE ABSCESS;  Surgeon: Zenovia Jarred, MD;  Location: [facility];  Service: General;  Laterality: Bilateral;  . stent     cardiac x 8 stents.    Allergies as of 07/20/2019      Reactions   Ambien [zolpidem Tartrate] Nausea And Vomiting   Codeine    Codeine Phosphate Nausea And Vomiting   Darvocet [propoxyphene N-acetaminophen] Nausea And Vomiting   Fish Allergy    Fish-derived Products    Hydrocodone    Promethazine    Promethazine Hcl Nausea And Vomiting   Sulfamethoxazole Other (See Comments)   Pt doesn't remember reaction   Vicodin [hydrocodone-acetaminophen] Other (See Comments)   Unknown reaction   Amoxicillin Nausea And Vomiting, Rash   Penicillins Nausea And Vomiting, Rash      Medication List  Accurate as of July 20, 2019 11:59 PM. If you have any questions, ask your nurse or doctor.        acetaminophen 325 MG tablet Commonly known as: TYLENOL Take 650 mg by mouth every 6 (six) hours as needed for mild pain, moderate pain or fever.   acetaminophen 325 MG tablet Commonly known as: TYLENOL Take 650 mg by mouth every 6 (six) hours. Take routinely for left hip pain   ALPRAZolam 0.5 MG tablet Commonly  known as: XANAX Take 1 tablet (0.5 mg total) by mouth daily.   amLODipine 10 MG tablet Commonly known as: NORVASC Take 10 mg by mouth every morning.   aspirin EC 81 MG tablet Take 1 tablet (81 mg total) by mouth daily.   atorvastatin 10 MG tablet Commonly known as: LIPITOR Take 10 mg by mouth every evening.   donepezil 10 MG tablet Commonly known as: ARICEPT Take 10 mg by mouth every evening.   losartan 50 MG tablet Commonly known as: COZAAR Take 50 mg by mouth at bedtime.   nitroGLYCERIN 0.4 MG SL tablet Commonly known as: NITROSTAT Place 0.4 mg under the tongue every 5 (five) minutes as needed for chest pain.   NON FORMULARY Diet Type:  Liberalized due to poor appetite to Mech soft   NUTRITIONAL SUPPLEMENT PO Take 1 each by mouth 2 (two) times a day. Magic Cup - take with lunch and dinner   ondansetron 4 MG tablet Commonly known as: ZOFRAN Take 4 mg by mouth every 4 (four) hours as needed for nausea.   oxybutynin 5 MG tablet Commonly known as: DITROPAN Take 5 mg by mouth 2 (two) times daily.   polyethylene glycol 17 g packet Commonly known as: MIRALAX / GLYCOLAX Take 17 g by mouth daily as needed for moderate constipation.   predniSONE 1 MG tablet Commonly known as: DELTASONE Take 1 mg by mouth daily with breakfast.   ProAir HFA 108 (90 Base) MCG/ACT inhaler Generic drug: albuterol Inhale 2 puffs into the lungs every 6 (six) hours as needed for wheezing or shortness of breath.   rOPINIRole 1 MG tablet Commonly known as: REQUIP Take 1 mg by mouth See admin instructions. 1mg  at noon, 1mg  2 hours before bed, then 1mg  at bedtime   senna-docusate 8.6-50 MG tablet Commonly known as: Senokot-S Take 1 tablet by mouth 2 (two) times daily.   Silenor 6 MG Tabs Generic drug: Doxepin HCl Take 0.5 tablets (3 mg total) by mouth at bedtime.   traMADol 50 MG tablet Commonly known as: ULTRAM Take 1 tablet (50 mg total) by mouth 2 (two) times daily.   Tums 500 MG  chewable tablet Generic drug: calcium carbonate Chew 1 tablet by mouth 3 (three) times daily as needed for indigestion or heartburn.   Vitamin D3 1.25 MG (50000 UT) Caps Take 1 capsule by mouth once a week.   warfarin 2.5 MG tablet Commonly known as: COUMADIN Take 2.5 mg by mouth every Monday, Wednesday, and Friday.   warfarin 2 MG tablet Commonly known as: COUMADIN Take 2 mg by mouth See admin instructions. Give Tue, Thur, Sat, Sun       No orders of the defined types were placed in this encounter.   Immunization History  Administered Date(s) Administered  . Influenza-Unspecified 08/01/2016, 08/11/2017, 07/02/2018  . PPD Test 03/18/2017  . Pneumococcal Conjugate-13 09/17/2017  . Pneumococcal Polysaccharide-23 04/17/2019    Social History   Tobacco Use  . Smoking status: Never Smoker  . Smokeless  tobacco: Never Used  Substance Use Topics  . Alcohol use: No     Vitals:   07/20/19 0853  BP: 138/82  Pulse: 74  Resp: 18  Temp: 98.7 F (37.1 C)   Body mass index is 24.3 kg/m.   Patient Active Problem List   Diagnosis Date Noted  . Failure to thrive in adult 01/08/2019  . Moderate protein malnutrition ([facility]) 01/08/2019  . Pressure injury of skin 12/18/2018  . Intertrochanteric fracture of left femur ([facility]) 12/11/2018  . CKD (chronic kidney disease), stage III ([facility]) 12/11/2018  . Depression with anxiety 12/11/2018  . Overactive bladder 09/24/2017  . Insomnia 07/31/2017  . Anxiety 06/16/2017  . Citrobacter infection 03/19/2017  . AKI (acute kidney injury) ([facility]) 03/19/2017  . History of pulmonary embolus (PE) 03/19/2017  . Elevated INR 03/19/2017  . Candidal intertrigo 03/19/2017  . Compression fracture of L2 ([facility]) 03/12/2017  . Acute lower UTI 03/12/2017  . Accelerated hypertension 03/12/2017  . Leukocytosis 07/31/2015  . Acute bronchitis 07/28/2015  . Chronic anticoagulation 07/28/2015  . Intractable back pain 07/20/2015  . Back pain 07/20/2015  .  Cellulitis and abscess of left buttock 04/12/2013  . Stricture and stenosis of esophagus 10/10/2012  . Dementia ([facility]) 10/06/2012  . Dysphagia 10/06/2012  . Axillary abscess - bilateral, multiple 09/27/2012  . Hypotension 08/24/2012  . Near syncope 08/24/2012  . Abscess of axilla, left 06/27/2012  . Bradycardia 06/13/2012  . Abscess 06/11/2012  . Chest pain 06/09/2012  . Cellulitis 06/09/2012  . Weakness of both legs 06/09/2012  . PMR (polymyalgia rheumatica) (HCC)   . Restless leg syndrome   . Angina pectoris, unstable ([facility]) 03/02/2012  . Acute blood loss as cause of postoperative anemia 03/02/2012  . Dyspnea 02/03/2012  . UTI (urinary tract infection) 02/03/2012  . CAD (coronary artery disease)   . HTN (hypertension)   . VTE (venous thromboembolism) 10/01/2010  . HYPERCHOLESTEROLEMIA  IIA 08/27/2009  . Hyperlipidemia 12/11/2008  . HYPERTENSION, BENIGN 12/11/2008    CMP     Component Value Date/Time   NA 141 06/12/2019   K 4.3 06/12/2019   CL 105 12/17/2018 0417   CO2 22 12/17/2018 0417   GLUCOSE 104 (H) 12/17/2018 0417   BUN 25 (A) 06/12/2019   CREATININE 0.8 06/12/2019   CREATININE 0.99 12/17/2018 0417   CALCIUM 7.7 (L) 12/17/2018 0417   PROT 6.7 03/11/2017 2031   ALBUMIN 3.4 (L) 03/11/2017 2031   AST 14 06/12/2019   ALT 12 06/12/2019   ALKPHOS 49 06/12/2019   BILITOT 0.5 03/11/2017 2031   GFRNONAA 48 (L) 12/17/2018 0417   GFRAA 56 (L) 12/17/2018 0417   Recent Labs    12/14/18 0536 12/15/18 0911 12/16/18 0611 12/17/18 0417 12/27/18 06/12/19  NA 138 138 136 135 142 141  K 3.9 3.4* 4.0 4.1 3.6 4.3  CL 110 108 108 105  --   --   CO2 22 23 22 22   --   --   GLUCOSE 144* 85 96 104*  --   --   BUN 25* 25* 21 20 32* 25*  CREATININE 1.13* 0.81 0.86 0.99 0.9 0.8  CALCIUM 8.3* 7.8* 7.8* 7.7*  --   --   MG 2.1 2.3 2.2  --   --   --    Recent Labs    08/01/18 06/12/19  AST 9* 14  ALT 7 12  ALKPHOS 58 49   Recent Labs    12/16/18 0611 12/17/18 0417  12/18/18 0408 12/27/18 06/12/19  WBC 7.4 7.3 8.2 12.2 7.0  NEUTROABS  --  4.7 5.3  --  3  HGB 7.3* 9.4* 9.5* 9.0* 11.1*  HCT 24.5* 31.3* 31.4* 28* 35*  MCV 83.3 83.2 83.3  --   --   PLT 282 265 257 404* 279   Recent Labs    08/01/18  CHOL 140  LDLCALC 80  TRIG 82   No results found for: [facility] Lab Results  Component Value Date   TSH 1.71 08/01/2018   Lab Results  Component Value Date   HGBA1C 5.7 08/01/2018   Lab Results  Component Value Date   CHOL 140 08/01/2018   HDL 46 08/01/2018   LDLCALC 80 08/01/2018   TRIG 82 08/01/2018   CHOLHDL 2.4 03/03/2012    Significant Diagnostic Results in last 30 days:  No results found.  Assessment and Plan  History of PE/supratherapeutic INR- patient's INR today is 3.8.  Plan to hold until INR is less than 2.7 then will start a new regimen of 2.5 mg of Coumadin on Monday Wednesday and 2 mg of Coumadin on Tuesday Thursday Friday Saturday and Sunday.  Recheck INR 1 week.    Hennie Duos , MD

## 2019-07-25 ENCOUNTER — Encounter: Payer: Self-pay | Admitting: Internal Medicine

## 2019-07-25 ENCOUNTER — Non-Acute Institutional Stay (SKILLED_NURSING_FACILITY): Payer: Medicare Other | Admitting: Internal Medicine

## 2019-07-25 DIAGNOSIS — F0280 Dementia in other diseases classified elsewhere without behavioral disturbance: Secondary | ICD-10-CM

## 2019-07-25 DIAGNOSIS — Z86711 Personal history of pulmonary embolism: Secondary | ICD-10-CM | POA: Diagnosis not present

## 2019-07-25 DIAGNOSIS — G301 Alzheimer's disease with late onset: Secondary | ICD-10-CM

## 2019-07-25 DIAGNOSIS — E785 Hyperlipidemia, unspecified: Secondary | ICD-10-CM

## 2019-07-25 DIAGNOSIS — M353 Polymyalgia rheumatica: Secondary | ICD-10-CM

## 2019-07-25 DIAGNOSIS — N3281 Overactive bladder: Secondary | ICD-10-CM

## 2019-07-25 DIAGNOSIS — I2510 Atherosclerotic heart disease of native coronary artery without angina pectoris: Secondary | ICD-10-CM | POA: Diagnosis not present

## 2019-07-25 DIAGNOSIS — I10 Essential (primary) hypertension: Secondary | ICD-10-CM

## 2019-07-25 NOTE — Progress Notes (Signed)
Location:  [facility] Room Number: 204-D Place of Service:  SNF [phone]) Provider:  Granville Lewis, PA-C  Hennie Duos, MD  Patient Care Team: Hennie Duos, MD as PCP - General (Internal Medicine)  Extended Emergency Contact Information Primary Emergency Contact: Neises,[name] Address: [address], [place] [address] [name] of [place] Phone: [phone] Mobile Phone: [phone] Relation: Son Secondary Emergency Contact: [place] of [place] Phone: [phone] Relation: Daughter  Code Status:  DNR Goals of care: Advanced Directive information Advanced Directives 07/25/2019  Does Patient Have a Medical Advance Directive? Yes  Type of Advance Directive Out of facility DNR (pink MOST or yellow form)  Does patient want to make changes to medical advance directive? No - Guardian declined  Copy of [place] in Chart? -  Pre-existing out of facility DNR order (yellow form or pink MOST form) Yellow form placed in chart (order not valid for inpatient use)     Chief Complaint  Patient presents with   Medical Management of Chronic Issues    Routine [name] visit  Medical management of chronic medical conditions including history of dementia-coronary artery disease history of pulmonary embolism on chronic Coumadin- as well as hyperlipidemia overactive bladder polymyalgia rheumatica history of left femoral fracture back in February as well as constipation anxiety hypertension insomnia.    HPI:  Pt is a 85 y.o. female seen today for medical management of chronic diseases as noted above.  Nursing has not reported any recent acute concerns and patient does not complain of really any issues either.  Her INR was elevated at 3.8 on September 18 she is on chronic Coumadin with a history of pulmonary embolism and adjustments were made with reducing her Coumadin  currently on 2.5 mg Monday Wednesday and Friday and 2 mg all other days update INR is pending for later this week there is been no increase evidence of bruising or bleeding.  She does have history of dementia this appears to be stable she is on Aricept nursing does not report any recent behaviors her weight is relatively stable at around 128 pounds.  She does have a history of coronary artery disease she is on aspirin 81 mg a day and atorvastatin 10 mg a day she does not really complain of any chest pain says at times it feels like her heart may be racing but does not complain of any pain and actually she does have more of a history of bradycardia- I got a pulse of 60 that was regular on exam today at this point will monitor.  Regards hyperlipidemia she is again on atorvastatin LDL was 80 on most recent lab.  She also has a history of following myalgia rheumatica continues on low-dose prednisone which appears to be helping.  In regards to hypertension she is on Cozaar 50 mg a day as well as Norvasc 10 mg a day blood pressures range 124/70-up to 155/70 I do not see consistent systolic elevations however.  Currently she is lying in bed comfortably working on a word puzzle she does not really have any complaints   Past Medical History:  Diagnosis Date   Anemia 03/02/2012   Angina pectoris, unstable ([facility]) 03/02/2012   Anxiety    Axillary abscess    Bradycardia 06/13/2012   CAD (coronary artery disease)    S/p PTCA / stenting (last cath 2004,  multiple LAD stents, 2 stents in the right coronary artery all patent)   Cancer ([facility])    Complication of anesthesia    Compression fracture of L2 ([facility]) 03/12/2017   Dementia ([facility])    Dysphagia 10/06/2012   Dyspnea 02/03/2012   GERD (gastroesophageal reflux disease)    History of pulmonary embolus (PE) 03/19/2017   HTN (hypertension)    PMR (polymyalgia rheumatica) (HCC)    PONV (postoperative nausea and vomiting)    Restless leg syndrome     VTE (venous thromboembolism) 10/2010   DVT and PE. Started coumadin   Weakness of both legs 06/09/2012   Past Surgical History:  Procedure Laterality Date   BREAST LUMPECTOMY     COLECTOMY     ESOPHAGOGASTRODUODENOSCOPY (EGD) WITH ESOPHAGEAL DILATION  10/10/2012   Procedure: ESOPHAGOGASTRODUODENOSCOPY (EGD) WITH ESOPHAGEAL DILATION;  Surgeon: Inda Castle, MD;  Location: [facility];  Service: Endoscopy;  Laterality: N/A;   FEMUR IM NAIL Left 12/13/2018   Procedure: INTRAMEDULLARY (IM) NAIL FEMORAL LEFT;  Surgeon: Paralee Cancel, MD;  [facility];  Service: Orthopedics;  Laterality: Left;   heart stents  x 8   INCISION AND DRAINAGE     bilateral axillary, non specific staff   INCISION AND DRAINAGE ABSCESS  09/28/2012   Procedure: INCISION AND DRAINAGE ABSCESS;  Surgeon: Zenovia Jarred, MD;  Location: [facility];  Service: General;  Laterality: Bilateral;   stent     cardiac x 8 stents.    Allergies  Allergen Reactions   Ambien [Zolpidem Tartrate] Nausea And Vomiting   Codeine    Codeine Phosphate Nausea And Vomiting   Darvocet [Propoxyphene N-Acetaminophen] Nausea And Vomiting   Fish Allergy    Fish-Derived Products    Hydrocodone    Promethazine    Promethazine Hcl Nausea And Vomiting   Sulfamethoxazole Other (See Comments)    Pt doesn't remember reaction   Vicodin [Hydrocodone-Acetaminophen] Other (See Comments)    Unknown reaction   Amoxicillin Nausea And Vomiting and Rash   Penicillins Nausea And Vomiting and Rash    Outpatient Encounter Medications as of 07/25/2019  Medication Sig   acetaminophen (TYLENOL) 325 MG tablet Take 650 mg by mouth every 6 (six) hours as needed for mild pain, moderate pain or fever.   acetaminophen (TYLENOL) 325 MG tablet Take 650 mg by mouth every 6 (six) hours. Take routinely for left hip pain   albuterol (PROAIR HFA) 108 (90 Base) MCG/ACT inhaler Inhale 2 puffs into the lungs every 6 (six) hours as needed for  wheezing or shortness of breath.   ALPRAZolam (XANAX) 0.5 MG tablet Take 1 tablet (0.5 mg total) by mouth daily.   amLODipine (NORVASC) 10 MG tablet Take 10 mg by mouth every morning.    aspirin EC 81 MG tablet Take 1 tablet (81 mg total) by mouth daily.   atorvastatin (LIPITOR) 10 MG tablet Take 10 mg by mouth every evening.   calcium carbonate (TUMS) 500 MG chewable tablet Chew 1 tablet by mouth 3 (three) times daily as needed for indigestion or heartburn.   Cholecalciferol (VITAMIN D3) 1.25 MG (50000 UT) CAPS Take 1 capsule by mouth once a week.   donepezil (ARICEPT) 10 MG tablet Take 10 mg by mouth every evening.    losartan (COZAAR) 50 MG tablet Take 50 mg by mouth at bedtime.    nitroGLYCERIN (NITROSTAT) 0.4 MG SL tablet Place 0.4 mg under the tongue every 5 (five) minutes as needed for chest pain.   NON FORMULARY  Diet Type:  Liberalized due to poor appetite to Mech soft   Nutritional Supplements (NUTRITIONAL SUPPLEMENT PO) Take 1 each by mouth 2 (two) times a day. Magic Cup - take with lunch and dinner   ondansetron (ZOFRAN) 4 MG tablet Take 4 mg by mouth every 4 (four) hours as needed for nausea.    oxybutynin (DITROPAN) 5 MG tablet Take 5 mg by mouth 2 (two) times daily.    polyethylene glycol (MIRALAX / GLYCOLAX) packet Take 17 g by mouth daily as needed for moderate constipation.   predniSONE (DELTASONE) 1 MG tablet Take 1 mg by mouth daily with breakfast.    rOPINIRole (REQUIP) 1 MG tablet Take 1 mg by mouth See admin instructions. 1mg  at noon, 1mg  2 hours before bed, then 1mg  at bedtime   senna-docusate (SENOKOT-S) 8.6-50 MG tablet Take 1 tablet by mouth 2 (two) times daily.   SILENOR 6 MG TABS Take 0.5 tablets (3 mg total) by mouth at bedtime.   traMADol (ULTRAM) 50 MG tablet Take 1 tablet (50 mg total) by mouth 2 (two) times daily.   warfarin (COUMADIN) 2 MG tablet Take 2 mg by mouth See admin instructions. Give Tue, Thur, Sat, Sun   warfarin (COUMADIN) 2.5 MG  tablet Take 2.5 mg by mouth every Monday, Wednesday, and Friday.   No facility-administered encounter medications on file as of 07/25/2019.     Review of Systems   This is somewhat limited secondary to patient being poor historian please see HPI.  General no complaints of fever chills.  Skin does not complain of rashes itching or increased bruising.  Head ears eyes nose mouth and throat does not complain of visual changes or sore throat.  Respiratory is not complaining of shortness of breath or chest pain.  Cardiac does not complain of chest pain has quite mild lower extremity edema.  GI does not complain of abdominal pain nausea vomiting diarrhea constipation says she has a good appetite.  GU has a history of overactive bladder but is not complaining of dysuria today.  Musculoskeletal does not complain of joint pain does have some weakness lower extremities.  Neurologic positive for weakness she does not complain of joint pain at this time.  Psych does have a history of some cognitive impairment but no behaviors noted by staff she does not complain of being overtly depressed or anxious  Immunization History  Administered Date(s) Administered   Influenza-Unspecified 08/01/2016, 08/11/2017, 07/02/2018   PPD Test 03/18/2017   Pneumococcal Conjugate-13 09/17/2017   Pneumococcal Polysaccharide-23 04/17/2019   Pertinent  Health Maintenance Due  Topic Date Due   INFLUENZA VACCINE  06/02/2019   DEXA SCAN  Completed   PNA vac Low Risk Adult  Completed   Fall Risk  03/14/2018  Falls in the past year? No   Functional Status Survey:    Vitals:   07/25/19 1201  BP: 121/70  Pulse: 79  Resp: 18  Temp: (!) 97 F (36.1 C)  TempSrc: Oral  Weight: 128 lb 9.6 oz (58.3 kg)  Height: 5\' 1"  (1.549 m)   Body mass index is 24.3 kg/m. Physical Exam   In general this is a pleasant elderly female in no distress lying comfortably in bed working on her word puzzle.  Her skin  is warm and dry  Eyes visual acuity appears grossly intact her sclera and conjunctive are clear.  Oropharynx clear mucous membranes moist.  Chest is clear to auscultation with somewhat shallow air entry there is no labored  breathing.  Heart is regular rate and rhythm in the 60s she has quite mild trace lower extremity edema.  Abdomen is somewhat protuberant soft nontender with positive bowel sounds.  Musculoskeletal Limited exam since she is in bed but is able to move all extremities x4 with what appears to be some lower extremity weakness.  Neurologic as noted above cannot really appreciate lateralizing findings her speech is clear cranial nerves appear grossly intact.  Psych she is oriented to self she is pleasant and appropriate.    Labs reviewed: Recent Labs    12/14/18 0536 12/15/18 0911 12/16/18 0611 12/17/18 0417 12/27/18 06/12/19  NA 138 138 136 135 142 141  K 3.9 3.4* 4.0 4.1 3.6 4.3  CL 110 108 108 105  --   --   CO2 22 23 22 22   --   --   GLUCOSE 144* 85 96 104*  --   --   BUN 25* 25* 21 20 32* 25*  CREATININE 1.13* 0.81 0.86 0.99 0.9 0.8  CALCIUM 8.3* 7.8* 7.8* 7.7*  --   --   MG 2.1 2.3 2.2  --   --   --    Recent Labs    08/01/18 06/12/19  AST 9* 14  ALT 7 12  ALKPHOS 58 49   Recent Labs    12/16/18 0611 12/17/18 0417 12/18/18 0408 12/27/18 06/12/19  WBC 7.4 7.3 8.2 12.2 7.0  NEUTROABS  --  4.7 5.3  --  3  HGB 7.3* 9.4* 9.5* 9.0* 11.1*  HCT 24.5* 31.3* 31.4* 28* 35*  MCV 83.3 83.2 83.3  --   --   PLT 282 265 257 404* 279   Lab Results  Component Value Date   TSH 1.71 08/01/2018   Lab Results  Component Value Date   HGBA1C 5.7 08/01/2018   Lab Results  Component Value Date   CHOL 140 08/01/2018   HDL 46 08/01/2018   LDLCALC 80 08/01/2018   TRIG 82 08/01/2018   CHOLHDL 2.4 03/03/2012    Significant Diagnostic Results in last 30 days:  No results found.  Assessment/Plan  #1-history of dementia this appears stable with supportive  care she is on Aricept 10 mg a day weight appears to be stable around 128 pounds she says she has a good appetite nursing does not report any behaviors at this point continue supportive care.  2.  History of coronary artery disease at this point appears to be stable she is on aspirin 81 mg a day and atorvastatin 10 mg a day-she does not complain of any chest pain says at times she feels like her heart is a little fast but she actually has more history of bradycardia rhythm was regular today at this point will monitor.  3.  History of pulmonary embolism on chronic Coumadin Coumadin dose was reduced because of an INR of 3.8 on lab on September 18 update INR is pending 2 days.  4.  History of hyperlipidemia continues on atorvastatin LDL was 80 on most recent lab.  5.  History of polymyalgia rheumatica this appears to be relatively well controlled on prednisone.  6.  History of overactive bladder she continues on Ditropan 5 mg twice daily.  7.  History of left femoral fracture in February 2020 pain appears to be controlled with tramadol and Tylenol she is not really complaining of pain today.  8.  History of hypertension she continues on Cozaar 50 mg a day Norvasc 10 mg a day as noted  above recent blood pressures did not show consistent elevations systolically above [id number] I do not see really any evidence of hypotension at this point will monitor.  9.  History of vitamin D deficiency she is on supplementation level recently was 34.2 update level is pending.  10.  History of anxiety she continues on Xanax 0.5 mg a day.  11.  History of constipation she continues on MiraLAX.  At this point she appears to be stable at this point will monitor and will await update INR.  [id number]     This encounter was created in error - please disregard.

## 2019-08-01 ENCOUNTER — Encounter: Payer: Self-pay | Admitting: Internal Medicine

## 2019-08-01 NOTE — Progress Notes (Signed)
This is a routine visit.  Level of care skilled.  Facility is Doctor, hospital Complaint  Patient presents with  . Medical Management of Chronic Issues    Routine [facility] visit  Medical management of chronic medical conditions including history of dementia-coronary artery disease history of pulmonary embolism on chronic Coumadin- as well as hyperlipidemia overactive bladder polymyalgia rheumatica history of left femoral fracture back in February as well as constipation anxiety hypertension insomnia.   HPI:  Pt is a 85 y.o. female seen today for medical management of chronic diseases as noted above.  Nursing has not reported any recent acute concerns and patient does not complain of really any issues either.  Her INR was elevated at 3.8 on September 18 she is on chronic Coumadin with a history of pulmonary embolism and adjustments were made with reducing her Coumadin currently on 2.5 mg Monday Wednesday and Friday and 2 mg all other days update INR is pending for later this week there is been no increase evidence of bruising or bleeding.  She does have history of dementia this appears to be stable she is on Aricept nursing does not report any recent behaviors her weight is relatively stable at around 128 pounds.  She does have a history of coronary artery disease she is on aspirin 81 mg a day and atorvastatin 10 mg a day she does not really complain of any chest pain says at times it feels like her heart may be racing but does not complain of any pain and actually she does have more of a history of bradycardia- I got a pulse of 60 that was regular on exam today at this point will monitor.  Regards hyperlipidemia she is again on atorvastatin LDL was 80 on most recent lab.  She also has a history of following myalgia rheumatica continues on low-dose prednisone which appears to be helping.  In regards to hypertension she is on Cozaar 50 mg a day as well as Norvasc 10 mg a day blood  pressures range 124/70-up to 155/70 I do not see consistent systolic elevations however.  Currently she is lying in bed comfortably working on a word puzzle she does not really have any complaints      Past Medical History:  Diagnosis Date  . Anemia 03/02/2012  . Angina pectoris, unstable ([place]) 03/02/2012  . Anxiety   . Axillary abscess   . Bradycardia 06/13/2012  . CAD (coronary artery disease)    S/p PTCA / stenting (last cath 2004, multiple LAD stents, 2 stents in the right coronary artery all patent)  . Cancer ([place])   . Complication of anesthesia   . Compression fracture of L2 ([place]) 03/12/2017  . Dementia ([place])   . Dysphagia 10/06/2012  . Dyspnea 02/03/2012  . GERD (gastroesophageal reflux disease)   . History of pulmonary embolus (PE) 03/19/2017  . HTN (hypertension)   . PMR (polymyalgia rheumatica) (HCC)   . PONV (postoperative nausea and vomiting)   . Restless leg syndrome   . 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 Started coumadin  . Weakness of both legs 06/09/2012        Past Surgical History:  Procedure Laterality Date  . BREAST LUMPECTOMY    . COLECTOMY    . ESOPHAGOGASTRODUODENOSCOPY (EGD) WITH ESOPHAGEAL DILATION  10/10/2012   Procedure: ESOPHAGOGASTRODUODENOSCOPY (EGD) WITH ESOPHAGEAL DILATION; Surgeon: Inda Castle, MD; Location: [place]; Service: Endoscopy; Laterality: N/A;  . FEMUR IM NAIL Left 12/13/2018  Procedure: INTRAMEDULLARY (IM) NAIL FEMORAL LEFT; Surgeon: Paralee Cancel, MD; Location: [facility]; Service: Orthopedics; Laterality: Left;  . heart stents  x 8  . INCISION AND DRAINAGE     bilateral axillary, non specific staff  . INCISION AND DRAINAGE ABSCESS  09/28/2012   Procedure: INCISION AND DRAINAGE ABSCESS; Surgeon: Zenovia Jarred, MD; Location: [place]; Service: General; Laterality: Bilateral;  . stent     cardiac x 8 stents.        Allergies  Allergen Reactions  . Ambien [Zolpidem Tartrate] Nausea And Vomiting  . Codeine   . Codeine  Phosphate Nausea And Vomiting  . Darvocet [Propoxyphene N-Acetaminophen] Nausea And Vomiting  . Fish Allergy   . Fish-Derived Products   . Hydrocodone   . Promethazine   . Promethazine Hcl Nausea And Vomiting  . Sulfamethoxazole Other (See Comments)    Pt doesn't remember reaction  . Vicodin [Hydrocodone-Acetaminophen] Other (See Comments)    Unknown reaction  . Amoxicillin Nausea And Vomiting and Rash  . Penicillins Nausea And Vomiting and Rash       Outpatient Encounter Medications as of 07/25/2019  Medication Sig  . acetaminophen (TYLENOL) 325 MG tablet Take 650 mg by mouth every 6 (six) hours as needed for mild pain, moderate pain or fever.  Marland Kitchen acetaminophen (TYLENOL) 325 MG tablet Take 650 mg by mouth every 6 (six) hours. Take routinely for left hip pain  . albuterol (PROAIR HFA) 108 (90 Base) MCG/ACT inhaler Inhale 2 puffs into the lungs every 6 (six) hours as needed for wheezing or shortness of breath.  . ALPRAZolam (XANAX) 0.5 MG tablet Take 1 tablet (0.5 mg total) by mouth daily.  Marland Kitchen amLODipine (NORVASC) 10 MG tablet Take 10 mg by mouth every morning.   Marland Kitchen aspirin EC 81 MG tablet Take 1 tablet (81 mg total) by mouth daily.  Marland Kitchen atorvastatin (LIPITOR) 10 MG tablet Take 10 mg by mouth every evening.  . calcium carbonate (TUMS) 500 MG chewable tablet Chew 1 tablet by mouth 3 (three) times daily as needed for indigestion or heartburn.  . Cholecalciferol (VITAMIN D3) 1.25 MG (50000 UT) CAPS Take 1 capsule by mouth once a week.  . donepezil (ARICEPT) 10 MG tablet Take 10 mg by mouth every evening.   Marland Kitchen losartan (COZAAR) 50 MG tablet Take 50 mg by mouth at bedtime.   . nitroGLYCERIN (NITROSTAT) 0.4 MG SL tablet Place 0.4 mg under the tongue every 5 (five) minutes as needed for chest pain.  . NON FORMULARY Diet Type: Liberalized due to poor appetite to Mech soft  . Nutritional Supplements (NUTRITIONAL SUPPLEMENT PO) Take 1 each by mouth 2 (two) times a day. Magic Cup - take with lunch and  dinner  . ondansetron (ZOFRAN) 4 MG tablet Take 4 mg by mouth every 4 (four) hours as needed for nausea.   Marland Kitchen oxybutynin (DITROPAN) 5 MG tablet Take 5 mg by mouth 2 (two) times daily.   . polyethylene glycol (MIRALAX / GLYCOLAX) packet Take 17 g by mouth daily as needed for moderate constipation.  . predniSONE (DELTASONE) 1 MG tablet Take 1 mg by mouth daily with breakfast.   . rOPINIRole (REQUIP) 1 MG tablet Take 1 mg by mouth See admin instructions. 1mg  at noon, 1mg  2 hours before bed, then 1mg  at bedtime  . senna-docusate (SENOKOT-S) 8.6-50 MG tablet Take 1 tablet by mouth 2 (two) times daily.  Marland Kitchen SILENOR 6 MG TABS Take 0.5 tablets (3 mg total) by mouth at bedtime.  Marland Kitchen  traMADol (ULTRAM) 50 MG tablet Take 1 tablet (50 mg total) by mouth 2 (two) times daily.  Marland Kitchen warfarin (COUMADIN) 2 MG tablet Take 2 mg by mouth See admin instructions. Give Tue, Thur, Sat, Sun  . warfarin (COUMADIN) 2.5 MG tablet Take 2.5 mg by mouth every Monday, Wednesday, and Friday.   No facility-administered encounter medications on file as of 07/25/2019.   Review of Systems  This is somewhat limited secondary to patient being poor historian please see HPI.  General no complaints of fever chills.  Skin does not complain of rashes itching or increased bruising.  Head ears eyes nose mouth and throat does not complain of visual changes or sore throat.  Respiratory is not complaining of shortness of breath or chest pain.  Cardiac does not complain of chest pain has quite mild lower extremity edema.  GI does not complain of abdominal pain nausea vomiting diarrhea constipation says she has a good appetite.  GU has a history of overactive bladder but is not complaining of dysuria today.  Musculoskeletal does not complain of joint pain does have some weakness lower extremities.  Neurologic positive for weakness she does not complain of joint pain at this time.  Psych does have a history of some cognitive impairment but no behaviors  noted by staff she does not complain of being overtly depressed or anxious      Immunization History  Administered Date(s) Administered  . Influenza-Unspecified 08/01/2016, 08/11/2017, 07/02/2018  . PPD Test 03/18/2017  . Pneumococcal Conjugate-13 09/17/2017  . Pneumococcal Polysaccharide-23 04/17/2019       Pertinent Health Maintenance Due  Topic Date Due  . INFLUENZA VACCINE  06/02/2019  . DEXA SCAN  Completed  . PNA vac Low Risk Adult  Completed   Fall Risk  03/14/2018  Falls in the past year? No  Functional Status Survey:     Vitals:   07/25/19 1201  BP: 121/70  Pulse: 79  Resp: 18  Temp: (!) 97 F (36.1 C)  TempSrc: Oral  Weight: 128 lb 9.6 oz (58.3 kg)  Height: 5\' 1"  (1.549 m)  Body mass index is 24.3 kg/m.  Physical Exam  In general this is a pleasant elderly female in no distress lying comfortably in bed working on her word puzzle.  Her skin is warm and dry  Eyes visual acuity appears grossly intact her sclera and conjunctive are clear.  Oropharynx clear mucous membranes moist.  Chest is clear to auscultation with somewhat shallow air entry there is no labored breathing.  Heart is regular rate and rhythm in the 60s she has quite mild trace lower extremity edema.  Abdomen is somewhat protuberant soft nontender with positive bowel sounds.  Musculoskeletal Limited exam since she is in bed but is able to move all extremities x4 with what appears to be some lower extremity weakness.  Neurologic as noted above cannot really appreciate lateralizing findings her speech is clear cranial nerves appear grossly intact.  Psych she is oriented to self she is pleasant and appropriate.  Labs reviewed:  Recent Labs (within last 365 days)          Recent Labs   12/14/18  0536 12/15/18  0911 12/16/18  0611 12/17/18  0417 12/27/18 06/12/19  NA 138 138 136 135 142 141  K 3.9 3.4* 4.0 4.1 3.6 4.3  CL 110 108 108 105 --  --   CO2 22 23 22 22  --  --   GLUCOSE 144* 85 96 104* --   --  BUN 25* 25* 21 20 32* 25*  CREATININE 1.13* 0.81 0.86 0.99 0.9 0.8  CALCIUM 8.3* 7.8* 7.8* 7.7* --  --   MG 2.1 2.3 2.2 --  --  --    Recent Labs (within last 365 days)       Recent Labs    08/01/18 06/12/19   AST 9* 14   ALT 7 12   ALKPHOS 58 49    Recent Labs (within last 365 days)         Recent Labs   12/16/18  0611 12/17/18  0417 12/18/18  0408 12/27/18 06/12/19  WBC 7.4 7.3 8.2 12.2 7.0  NEUTROABS --  4.7 5.3 --  3  HGB 7.3* 9.4* 9.5* 9.0* 11.1*  HCT 24.5* 31.3* 31.4* 28* 35*  MCV 83.3 83.2 83.3 --  --   PLT 282 265 257 404* 279   Recent Labs                       Recent Labs                       Recent Labs                                              Significant Diagnostic Results in last 30 days:  Imaging Results    Assessment/Plan  #1-history of dementia this appears stable with supportive care she is on Aricept 10 mg a day weight appears to be stable around 128 pounds she says she has a good appetite nursing does not report any behaviors at this point continue supportive care.  2. History of coronary artery disease at this point appears to be stable she is on aspirin 81 mg a day and atorvastatin 10 mg a day-she does not complain of any chest pain says at times she feels like her heart is a little fast but she actually has more history of bradycardia rhythm was regular today at this point will monitor.  3. History of pulmonary embolism on chronic Coumadin Coumadin dose was reduced because of an INR of 3.8 on lab on September 18 update INR is pending 2 days.  4. History of hyperlipidemia continues on atorvastatin LDL was 80 on most recent lab.  5. History of polymyalgia rheumatica this appears to be relatively well controlled on prednisone.  6. History of overactive bladder she continues on Ditropan 5 mg twice daily.  7. History of left femoral fracture in February 2020 pain appears to be controlled with tramadol and Tylenol she is not really  complaining of pain today.  8. History of hypertension she continues on Cozaar 50 mg a day Norvasc 10 mg a day as noted above recent blood pressures did not show consistent elevations systolically above [id number] I do not see really any evidence of hypotension at this point will monitor.  9. History of vitamin D deficiency she is on supplementation level recently was 34.2 update level is pending.  10. History of anxiety she continues on Xanax 0.5 mg a day.  11. History of constipation she continues on MiraLAX.  At this point she appears to be stable at this point will monitor and will await update INR.    [id number]

## 2019-08-06 ENCOUNTER — Encounter: Payer: Self-pay | Admitting: Internal Medicine

## 2019-08-06 ENCOUNTER — Non-Acute Institutional Stay (SKILLED_NURSING_FACILITY): Payer: Medicare Other | Admitting: Internal Medicine

## 2019-08-06 DIAGNOSIS — Z86711 Personal history of pulmonary embolism: Secondary | ICD-10-CM

## 2019-08-06 DIAGNOSIS — Z7901 Long term (current) use of anticoagulants: Secondary | ICD-10-CM | POA: Diagnosis not present

## 2019-08-06 NOTE — Progress Notes (Signed)
Location:  [place] Room Number: 204-D Place of Service:  SNF (31)  Hennie Duos, MD  Patient Care Team: Hennie Duos, MD as PCP - General (Internal Medicine)  Extended Emergency Contact Information Primary Emergency Contact: Morozov,[name] Address: [address]          [place], [place] [address] [name] of [place] Phone: [phone] Mobile Phone: [phone] Relation: Son Secondary Emergency Contact: [place] of [place] Phone: [phone] Relation: Daughter    Allergies: Ambien [zolpidem tartrate], Codeine, Codeine phosphate, Darvocet [propoxyphene n-acetaminophen], Fish allergy, Fish-derived products, Hydrocodone, Promethazine, Promethazine hcl, Sulfamethoxazole, Vicodin [hydrocodone-acetaminophen], Amoxicillin, and Penicillins  Chief Complaint  Patient presents with  . Acute Visit    Anticoagulation management    HPI: Patient is a 85 y.o. female with history of chronic PE is being seen for management of her Coumadin.  INR is 1.7 today after being on several days of once daily.  Past Medical History:  Diagnosis Date  . Anemia 03/02/2012  . Angina pectoris, unstable ([place]) 03/02/2012  . Anxiety   . Axillary abscess   . Bradycardia 06/13/2012  . CAD (coronary artery disease)    S/p PTCA / stenting (last cath 2004, multiple LAD stents, 2 stents in the right coronary artery all patent)  . Cancer ([place])   . Complication of anesthesia   . Compression fracture of L2 ([place]) 03/12/2017  . Dementia ([place])   . Dysphagia 10/06/2012  . Dyspnea 02/03/2012  . GERD (gastroesophageal reflux disease)   . History of pulmonary embolus (PE) 03/19/2017  . HTN (hypertension)   . PMR (polymyalgia rheumatica) (HCC)   . PONV (postoperative nausea and vomiting)   . Restless leg syndrome   . 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 Started coumadin  . Weakness of both legs 06/09/2012    Past Surgical  History:  Procedure Laterality Date  . BREAST LUMPECTOMY    . COLECTOMY    . ESOPHAGOGASTRODUODENOSCOPY (EGD) WITH ESOPHAGEAL DILATION  10/10/2012   Procedure: ESOPHAGOGASTRODUODENOSCOPY (EGD) WITH ESOPHAGEAL DILATION;  Surgeon: Inda Castle, MD;  Location: [facility];  Service: Endoscopy;  Laterality: N/A;  . FEMUR IM NAIL Left 12/13/2018   Procedure: INTRAMEDULLARY (IM) NAIL FEMORAL LEFT;  Surgeon: Paralee Cancel, MD;  Location[facility];  Service: Orthopedics;  Laterality: Left;  . heart stents  x 8  . INCISION AND DRAINAGE     bilateral axillary, non specific staff  . INCISION AND DRAINAGE ABSCESS  09/28/2012   Procedure: INCISION AND DRAINAGE ABSCESS;  Surgeon: Zenovia Jarred, MD;  Location: [facility];  Service: General;  Laterality: Bilateral;  . stent     cardiac x 8 stents.    Allergies as of 08/06/2019      Reactions   Ambien [zolpidem Tartrate] Nausea And Vomiting   Codeine    Codeine Phosphate Nausea And Vomiting   Darvocet [propoxyphene N-acetaminophen] Nausea And Vomiting   Fish Allergy    Fish-derived Products    Hydrocodone    Promethazine    Promethazine Hcl Nausea And Vomiting   Sulfamethoxazole Other (See Comments)   Pt doesn't remember reaction   Vicodin [hydrocodone-acetaminophen] Other (See Comments)   Unknown reaction   Amoxicillin Nausea And Vomiting, Rash   Penicillins Nausea And Vomiting, Rash      Medication List       Accurate as of August 06, 2019  3:01 PM. If you have any questions, ask your  nurse or doctor.        acetaminophen 325 MG tablet Commonly known as: TYLENOL Take 650 mg by mouth every 6 (six) hours as needed for mild pain, moderate pain or fever.   acetaminophen 325 MG tablet Commonly known as: TYLENOL Take 650 mg by mouth every 6 (six) hours. Take routinely for left hip pain   ALPRAZolam 0.5 MG tablet Commonly known as: XANAX Take 1 tablet (0.5 mg total) by mouth daily.   amLODipine 10 MG tablet Commonly known as: NORVASC  Take 10 mg by mouth every morning.   aspirin EC 81 MG tablet Take 1 tablet (81 mg total) by mouth daily.   atorvastatin 10 MG tablet Commonly known as: LIPITOR Take 10 mg by mouth every evening.   donepezil 10 MG tablet Commonly known as: ARICEPT Take 10 mg by mouth every evening.   losartan 50 MG tablet Commonly known as: COZAAR Take 50 mg by mouth at bedtime.   nitroGLYCERIN 0.4 MG SL tablet Commonly known as: NITROSTAT Place 0.4 mg under the tongue every 5 (five) minutes as needed for chest pain.   NON FORMULARY Diet Type:  Liberalized due to poor appetite to Mech soft   NUTRITIONAL SUPPLEMENT PO Take 1 each by mouth 2 (two) times a day. Magic Cup - take with lunch and dinner   ondansetron 4 MG tablet Commonly known as: ZOFRAN Take 4 mg by mouth every 4 (four) hours as needed for nausea.   oxybutynin 5 MG tablet Commonly known as: DITROPAN Take 5 mg by mouth 2 (two) times daily.   polyethylene glycol 17 g packet Commonly known as: MIRALAX / GLYCOLAX Take 17 g by mouth daily as needed for moderate constipation.   predniSONE 1 MG tablet Commonly known as: DELTASONE Take 1 mg by mouth daily with breakfast.   ProAir HFA 108 (90 Base) MCG/ACT inhaler Generic drug: albuterol Inhale 2 puffs into the lungs every 6 (six) hours as needed for wheezing or shortness of breath.   rOPINIRole 1 MG tablet Commonly known as: REQUIP Take 1 mg by mouth See admin instructions. 1mg  at noon, 1mg  2 hours before bed, then 1mg  at bedtime   senna-docusate 8.6-50 MG tablet Commonly known as: Senokot-S Take 1 tablet by mouth 2 (two) times daily.   Silenor 6 MG Tabs Generic drug: Doxepin HCl Take 0.5 tablets (3 mg total) by mouth at bedtime.   traMADol 50 MG tablet Commonly known as: ULTRAM Take 1 tablet (50 mg total) by mouth 2 (two) times daily.   Tums 500 MG chewable tablet Generic drug: calcium carbonate Chew 1 tablet by mouth 3 (three) times daily as needed for indigestion  or heartburn.   Vitamin D3 1.25 MG (50000 UT) Caps Take 1 capsule by mouth once a week.   warfarin 2.5 MG tablet Commonly known as: COUMADIN Take 2.5 mg by mouth every Monday, Wednesday, and Friday.   warfarin 2 MG tablet Commonly known as: COUMADIN Take 2 mg by mouth See admin instructions. Give daily except Mondays       No orders of the defined types were placed in this encounter.   Immunization History  Administered Date(s) Administered  . Influenza-Unspecified 08/11/2017, 07/02/2018, 08/02/2019  . PPD Test 03/18/2017  . Pneumococcal Conjugate-13 09/17/2017  . Pneumococcal Polysaccharide-23 04/17/2019    Social History   Tobacco Use  . Smoking status: Never Smoker  . Smokeless tobacco: Never Used  Substance Use Topics  . Alcohol use: No  Vitals:   08/06/19 1450  BP: 127/64  Pulse: (!) 53  Resp: 18  Temp: (!) 96.8 F (36 C)   Body mass index is 24.3 kg/m.   Patient Active Problem List   Diagnosis Date Noted  . Failure to thrive in adult 01/08/2019  . Moderate protein malnutrition ([place]) 01/08/2019  . Pressure injury of skin 12/18/2018  . Intertrochanteric fracture of left femur ([place]) 12/11/2018  . CKD (chronic kidney disease), stage III 12/11/2018  . Depression with anxiety 12/11/2018  . Overactive bladder 09/24/2017  . Insomnia 07/31/2017  . Anxiety 06/16/2017  . Citrobacter infection 03/19/2017  . AKI (acute kidney injury) ([place]) 03/19/2017  . History of pulmonary embolus (PE) 03/19/2017  . Elevated INR 03/19/2017  . Candidal intertrigo 03/19/2017  . Compression fracture of L2 ([place]) 03/12/2017  . Acute lower UTI 03/12/2017  . Accelerated hypertension 03/12/2017  . Leukocytosis 07/31/2015  . Acute bronchitis 07/28/2015  . Chronic anticoagulation 07/28/2015  . Intractable back pain 07/20/2015  . Back pain 07/20/2015  . Cellulitis and abscess of left buttock 04/12/2013  . Stricture and stenosis of esophagus 10/10/2012  . Dementia ([place])  10/06/2012  . Dysphagia 10/06/2012  . Axillary abscess - bilateral, multiple 09/27/2012  . Hypotension 08/24/2012  . Near syncope 08/24/2012  . Abscess of axilla, left 06/27/2012  . Bradycardia 06/13/2012  . Abscess 06/11/2012  . Chest pain 06/09/2012  . Cellulitis 06/09/2012  . Weakness of both legs 06/09/2012  . PMR (polymyalgia rheumatica) (HCC)   . Restless leg syndrome   . Angina pectoris, unstable ([place]) 03/02/2012  . Acute blood loss as cause of postoperative anemia 03/02/2012  . Dyspnea 02/03/2012  . UTI (urinary tract infection) 02/03/2012  . CAD (coronary artery disease)   . HTN (hypertension)   . VTE (venous thromboembolism) 10/01/2010  . HYPERCHOLESTEROLEMIA  IIA 08/27/2009  . Hyperlipidemia 12/11/2008  . HYPERTENSION, BENIGN 12/11/2008    CMP     Component Value Date/Time   NA 141 06/12/2019   K 4.3 06/12/2019   CL 105 12/17/2018 0417   CO2 22 12/17/2018 0417   GLUCOSE 104 (H) 12/17/2018 0417   BUN 25 (A) 06/12/2019   CREATININE 0.8 06/12/2019   CREATININE 0.99 12/17/2018 0417   CALCIUM 7.7 (L) 12/17/2018 0417   PROT 6.7 03/11/2017 2031   ALBUMIN 3.4 (L) 03/11/2017 2031   AST 14 06/12/2019   ALT 12 06/12/2019   ALKPHOS 49 06/12/2019   BILITOT 0.5 03/11/2017 2031   GFRNONAA 48 (L) 12/17/2018 0417   GFRAA 56 (L) 12/17/2018 0417   Recent Labs    12/14/18 0536 12/15/18 0911 12/16/18 0611 12/17/18 0417 12/27/18 06/12/19  NA 138 138 136 135 142 141  K 3.9 3.4* 4.0 4.1 3.6 4.3  CL 110 108 108 105  --   --   CO2 22 23 22 22   --   --   GLUCOSE 144* 85 96 104*  --   --   BUN 25* 25* 21 20 32* 25*  CREATININE 1.13* 0.81 0.86 0.99 0.9 0.8  CALCIUM 8.3* 7.8* 7.8* 7.7*  --   --   MG 2.1 2.3 2.2  --   --   --    Recent Labs    06/12/19  AST 14  ALT 12  ALKPHOS 49   Recent Labs    12/16/18 0611 12/17/18 0417 12/18/18 0408 12/27/18 06/12/19  WBC 7.4 7.3 8.2 12.2 7.0  NEUTROABS  --  4.7 5.3  --  3  HGB  7.3* 9.4* 9.5* 9.0* 11.1*  HCT 24.5* 31.3*  31.4* 28* 35*  MCV 83.3 83.2 83.3  --   --   PLT 282 265 257 404* 279   No results for input(s): CHOL, LDLCALC, TRIG in the last 8760 hours.  Invalid input(s): HCL No results found for: [facility] Lab Results  Component Value Date   TSH 1.71 08/01/2018   Lab Results  Component Value Date   HGBA1C 5.7 08/01/2018   Lab Results  Component Value Date   CHOL 140 08/01/2018   HDL 46 08/01/2018   LDLCALC 80 08/01/2018   TRIG 82 08/01/2018   CHOLHDL 2.4 03/03/2012    Significant Diagnostic Results in last 30 days:  No results found.  Assessment and Plan  Chronic PE/Coumadin management- patient's INR is 1.7 today after being on several days of 1 mg.  Will restart regimen patient was on before her INR went high which is 2 mg daily with 5 mg on Monday.  Repeat INR in 7 days     Hennie Duos , MD

## 2019-08-08 DIAGNOSIS — F419 Anxiety disorder, unspecified: Secondary | ICD-10-CM | POA: Diagnosis not present

## 2019-08-08 DIAGNOSIS — F0390 Unspecified dementia without behavioral disturbance: Secondary | ICD-10-CM | POA: Diagnosis not present

## 2019-08-08 DIAGNOSIS — G4700 Insomnia, unspecified: Secondary | ICD-10-CM | POA: Diagnosis not present

## 2019-08-11 ENCOUNTER — Encounter: Payer: Self-pay | Admitting: Internal Medicine

## 2019-08-13 ENCOUNTER — Non-Acute Institutional Stay (SKILLED_NURSING_FACILITY): Payer: Medicare Other | Admitting: Internal Medicine

## 2019-08-13 DIAGNOSIS — I8290 Acute embolism and thrombosis of unspecified vein: Secondary | ICD-10-CM | POA: Diagnosis not present

## 2019-08-13 DIAGNOSIS — Z86711 Personal history of pulmonary embolism: Secondary | ICD-10-CM | POA: Diagnosis not present

## 2019-08-13 DIAGNOSIS — Z7901 Long term (current) use of anticoagulants: Secondary | ICD-10-CM

## 2019-08-17 ENCOUNTER — Encounter: Payer: Self-pay | Admitting: Internal Medicine

## 2019-08-17 NOTE — Progress Notes (Signed)
Location:  [facility] Room Number: 204-D Place of Service:  SNF (31)  Hennie Duos, MD  Patient Care Team: Hennie Duos, MD as PCP - General (Internal Medicine)  Extended Emergency Contact Information Primary Emergency Contact: Krasinski,[name] Address: [address]          [place], [place] [address] [name] of [place] Phone: [phone] Mobile Phone: [phone] Relation: Son Secondary Emergency Contact: Babbie of [place] Phone: [phone] Relation: Daughter    Allergies: Ambien [zolpidem tartrate], Codeine, Codeine phosphate, Darvocet [propoxyphene n-acetaminophen], Fish allergy, Fish-derived products, Hydrocodone, Promethazine, Promethazine hcl, Sulfamethoxazole, Vicodin [hydrocodone-acetaminophen], Amoxicillin, and Penicillins  Chief Complaint  Patient presents with  . Acute Visit    Anticoagulation management    HPI: Patient is a 85 y.o. female who is being seen for INR check for history of DVT and PE.  Today patient's INR is 2.4, which is therapeutic.  Past Medical History:  Diagnosis Date  . Anemia 03/02/2012  . Angina pectoris, unstable ([place]) 03/02/2012  . Anxiety   . Axillary abscess   . Bradycardia 06/13/2012  . CAD (coronary artery disease)    S/p PTCA / stenting (last cath 2004, multiple LAD stents, 2 stents in the right coronary artery all patent)  . Cancer ([place])   . Complication of anesthesia   . Compression fracture of L2 ([place]) 03/12/2017  . Dementia ([place])   . Dysphagia 10/06/2012  . Dyspnea 02/03/2012  . GERD (gastroesophageal reflux disease)   . History of pulmonary embolus (PE) 03/19/2017  . HTN (hypertension)   . PMR (polymyalgia rheumatica) (HCC)   . PONV (postoperative nausea and vomiting)   . Restless leg syndrome   . 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 Started coumadin  . Weakness of both legs 06/09/2012    Past Surgical History:  Procedure  Laterality Date  . BREAST LUMPECTOMY    . COLECTOMY    . ESOPHAGOGASTRODUODENOSCOPY (EGD) WITH ESOPHAGEAL DILATION  10/10/2012   Procedure: ESOPHAGOGASTRODUODENOSCOPY (EGD) WITH ESOPHAGEAL DILATION;  Surgeon: Inda Castle, MD;  Location: [facility];  Service: Endoscopy;  Laterality: N/A;  . FEMUR IM NAIL Left 12/13/2018   Procedure: INTRAMEDULLARY (IM) NAIL FEMORAL LEFT;  Surgeon: Paralee Cancel, MD;  Location: [facility];  Service: Orthopedics;  Laterality: Left;  . heart stents  x 8  . INCISION AND DRAINAGE     bilateral axillary, non specific staff  . INCISION AND DRAINAGE ABSCESS  09/28/2012   Procedure: INCISION AND DRAINAGE ABSCESS;  Surgeon: Zenovia Jarred, MD;  Location: [facility];  Service: General;  Laterality: Bilateral;  . stent     cardiac x 8 stents.    Allergies as of 08/13/2019      Reactions   Ambien [zolpidem Tartrate] Nausea And Vomiting   Codeine    Codeine Phosphate Nausea And Vomiting   Darvocet [propoxyphene N-acetaminophen] Nausea And Vomiting   Fish Allergy    Fish-derived Products    Hydrocodone    Promethazine    Promethazine Hcl Nausea And Vomiting   Sulfamethoxazole Other (See Comments)   Pt doesn't remember reaction   Vicodin [hydrocodone-acetaminophen] Other (See Comments)   Unknown reaction   Amoxicillin Nausea And Vomiting, Rash   Penicillins Nausea And Vomiting, Rash      Medication List       Accurate as of August 13, 2019 11:59 PM. If you have any questions, ask your nurse or doctor.  acetaminophen 325 MG tablet Commonly known as: TYLENOL Take 650 mg by mouth every 6 (six) hours as needed for mild pain, moderate pain or fever.   acetaminophen 325 MG tablet Commonly known as: TYLENOL Take 650 mg by mouth every 6 (six) hours. Take routinely for left hip pain   ALPRAZolam 0.25 MG tablet Commonly known as: XANAX Take 0.25 mg by mouth daily. What changed: Another medication with the same name was removed. Continue taking this  medication, and follow the directions you see here.   amLODipine 10 MG tablet Commonly known as: NORVASC Take 10 mg by mouth every morning.   aspirin EC 81 MG tablet Take 1 tablet (81 mg total) by mouth daily.   atorvastatin 10 MG tablet Commonly known as: LIPITOR Take 10 mg by mouth every evening.   donepezil 10 MG tablet Commonly known as: ARICEPT Take 10 mg by mouth every evening.   losartan 50 MG tablet Commonly known as: COZAAR Take 50 mg by mouth at bedtime.   nitroGLYCERIN 0.4 MG SL tablet Commonly known as: NITROSTAT Place 0.4 mg under the tongue every 5 (five) minutes as needed for chest pain.   NON FORMULARY Diet Type:  Liberalized due to poor appetite to Mech soft   NUTRITIONAL SUPPLEMENT PO Take 1 each by mouth 2 (two) times a day. Magic Cup - take with lunch and dinner   ondansetron 4 MG tablet Commonly known as: ZOFRAN Take 4 mg by mouth every 4 (four) hours as needed for nausea.   oxybutynin 5 MG tablet Commonly known as: DITROPAN Take 5 mg by mouth 2 (two) times daily.   polyethylene glycol 17 g packet Commonly known as: MIRALAX / GLYCOLAX Take 17 g by mouth daily as needed for moderate constipation.   predniSONE 1 MG tablet Commonly known as: DELTASONE Take 1 mg by mouth daily with breakfast.   ProAir HFA 108 (90 Base) MCG/ACT inhaler Generic drug: albuterol Inhale 2 puffs into the lungs every 6 (six) hours as needed for wheezing or shortness of breath.   rOPINIRole 1 MG tablet Commonly known as: REQUIP Take 1 mg by mouth See admin instructions. 1mg  at noon, 1mg  2 hours before bed, then 1mg  at bedtime   senna-docusate 8.6-50 MG tablet Commonly known as: Senokot-S Take 1 tablet by mouth 2 (two) times daily.   Silenor 6 MG Tabs Generic drug: Doxepin HCl Take 0.5 tablets (3 mg total) by mouth at bedtime.   traMADol 50 MG tablet Commonly known as: ULTRAM Take 1 tablet (50 mg total) by mouth 2 (two) times daily.   Tums 500 MG chewable  tablet Generic drug: calcium carbonate Chew 1 tablet by mouth 3 (three) times daily as needed for indigestion or heartburn.   Vitamin D3 1.25 MG (50000 UT) Caps Take 1 capsule by mouth once a week.   warfarin 2.5 MG tablet Commonly known as: COUMADIN Take 2.5 mg by mouth every Monday.   warfarin 2 MG tablet Commonly known as: COUMADIN Take 2 mg by mouth See admin instructions. Give daily except Mondays       No orders of the defined types were placed in this encounter.   Immunization History  Administered Date(s) Administered  . Influenza-Unspecified 08/11/2017, 07/02/2018, 08/02/2019  . PPD Test 03/18/2017  . Pneumococcal Conjugate-13 09/17/2017  . Pneumococcal Polysaccharide-23 04/17/2019    Social History   Tobacco Use  . Smoking status: Never Smoker  . Smokeless tobacco: Never Used  Substance Use Topics  . Alcohol use:  No     Vitals:   08/17/19 1538  BP: 123/72  Pulse: (!) 56  Resp: 17  Temp: 97.6 F (36.4 C)   Body mass index is 24.3 kg/m.   Patient Active Problem List   Diagnosis Date Noted  . Failure to thrive in adult 01/08/2019  . Moderate protein malnutrition ([facility]) 01/08/2019  . Pressure injury of skin 12/18/2018  . Intertrochanteric fracture of left femur ([place]) 12/11/2018  . CKD (chronic kidney disease), stage III 12/11/2018  . Depression with anxiety 12/11/2018  . Overactive bladder 09/24/2017  . Insomnia 07/31/2017  . Anxiety 06/16/2017  . Citrobacter infection 03/19/2017  . AKI (acute kidney injury) ([place]) 03/19/2017  . History of pulmonary embolus (PE) 03/19/2017  . Elevated INR 03/19/2017  . Candidal intertrigo 03/19/2017  . Compression fracture of L2 ([place]) 03/12/2017  . Acute lower UTI 03/12/2017  . Accelerated hypertension 03/12/2017  . Leukocytosis 07/31/2015  . Acute bronchitis 07/28/2015  . Chronic anticoagulation 07/28/2015  . Intractable back pain 07/20/2015  . Back pain 07/20/2015  . Cellulitis and abscess of left  buttock 04/12/2013  . Stricture and stenosis of esophagus 10/10/2012  . Dementia ([facility]) 10/06/2012  . Dysphagia 10/06/2012  . Axillary abscess - bilateral, multiple 09/27/2012  . Hypotension 08/24/2012  . Near syncope 08/24/2012  . Abscess of axilla, left 06/27/2012  . Bradycardia 06/13/2012  . Abscess 06/11/2012  . Chest pain 06/09/2012  . Cellulitis 06/09/2012  . Weakness of both legs 06/09/2012  . PMR (polymyalgia rheumatica) (HCC)   . Restless leg syndrome   . Angina pectoris, unstable ([place]) 03/02/2012  . Acute blood loss as cause of postoperative anemia 03/02/2012  . Dyspnea 02/03/2012  . UTI (urinary tract infection) 02/03/2012  . CAD (coronary artery disease)   . HTN (hypertension)   . VTE (venous thromboembolism) 10/01/2010  . HYPERCHOLESTEROLEMIA  IIA 08/27/2009  . Hyperlipidemia 12/11/2008  . HYPERTENSION, BENIGN 12/11/2008    CMP     Component Value Date/Time   NA 141 06/12/2019   K 4.3 06/12/2019   CL 105 12/17/2018 0417   CO2 22 12/17/2018 0417   GLUCOSE 104 (H) 12/17/2018 0417   BUN 25 (A) 06/12/2019   CREATININE 0.8 06/12/2019   CREATININE 0.99 12/17/2018 0417   CALCIUM 7.7 (L) 12/17/2018 0417   PROT 6.7 03/11/2017 2031   ALBUMIN 3.4 (L) 03/11/2017 2031   AST 14 06/12/2019   ALT 12 06/12/2019   ALKPHOS 49 06/12/2019   BILITOT 0.5 03/11/2017 2031   GFRNONAA 48 (L) 12/17/2018 0417   GFRAA 56 (L) 12/17/2018 0417   Recent Labs    12/14/18 0536 12/15/18 0911 12/16/18 0611 12/17/18 0417 12/27/18 06/12/19  NA 138 138 136 135 142 141  K 3.9 3.4* 4.0 4.1 3.6 4.3  CL 110 108 108 105  --   --   CO2 22 23 22 22   --   --   GLUCOSE 144* 85 96 104*  --   --   BUN 25* 25* 21 20 32* 25*  CREATININE 1.13* 0.81 0.86 0.99 0.9 0.8  CALCIUM 8.3* 7.8* 7.8* 7.7*  --   --   MG 2.1 2.3 2.2  --   --   --    Recent Labs    06/12/19  AST 14  ALT 12  ALKPHOS 49   Recent Labs    12/16/18 0611 12/17/18 0417 12/18/18 0408 12/27/18 06/12/19  WBC 7.4 7.3 8.2 12.2  7.0  NEUTROABS  --  4.7 5.3  --  3  HGB 7.3* 9.4* 9.5* 9.0* 11.1*  HCT 24.5* 31.3* 31.4* 28* 35*  MCV 83.3 83.2 83.3  --   --   PLT 282 265 257 404* 279   No results for input(s): CHOL, LDLCALC, TRIG in the last 8760 hours.  Invalid input(s): HCL No results found for: [employer] Lab Results  Component Value Date   TSH 1.71 08/01/2018   Lab Results  Component Value Date   HGBA1C 5.7 08/01/2018   Lab Results  Component Value Date   CHOL 140 08/01/2018   HDL 46 08/01/2018   LDLCALC 80 08/01/2018   TRIG 82 08/01/2018   CHOLHDL 2.4 03/03/2012    Significant Diagnostic Results in last 30 days:  No results found.  Assessment and Plan  History of PE/VTE/chronic anticoagulation-patient's INR is 2.4 on Coumadin 2 mg daily except for Coumadin 2.5 mg every Monday.  INR is therapeutic.  No change.  Repeat in 1 week    Hennie Duos , MD

## 2019-08-18 ENCOUNTER — Encounter: Payer: Self-pay | Admitting: Internal Medicine

## 2019-08-20 ENCOUNTER — Encounter: Payer: Self-pay | Admitting: Internal Medicine

## 2019-08-20 ENCOUNTER — Non-Acute Institutional Stay (SKILLED_NURSING_FACILITY): Payer: Medicare Other | Admitting: Internal Medicine

## 2019-08-20 DIAGNOSIS — Z7901 Long term (current) use of anticoagulants: Secondary | ICD-10-CM

## 2019-08-20 DIAGNOSIS — I8290 Acute embolism and thrombosis of unspecified vein: Secondary | ICD-10-CM

## 2019-08-20 DIAGNOSIS — Z86711 Personal history of pulmonary embolism: Secondary | ICD-10-CM

## 2019-08-20 NOTE — Progress Notes (Signed)
Location:  [facility] Room Number: 204-D Place of Service:  SNF (31)  Hennie Duos, MD  Patient Care Team: Hennie Duos, MD as PCP - General (Internal Medicine)  Extended Emergency Contact Information Primary Emergency Contact: Prange,[name] Address: [address]          [place], [place] [address] [name] of [place] Phone: [phone] Mobile Phone: [phone] Relation: Son Secondary Emergency Contact: [place]ab of [place] Phone: [phone] Relation: Daughter    Allergies: Ambien [zolpidem tartrate], Codeine, Codeine phosphate, Darvocet [propoxyphene n-acetaminophen], Fish allergy, Fish-derived products, Hydrocodone, Promethazine, Promethazine hcl, Sulfamethoxazole, Vicodin [hydrocodone-acetaminophen], Amoxicillin, and Penicillins  Chief Complaint  Patient presents with  . Acute Visit    Anticoagulation management    HPI: Patient is a 85 y.o. female who is on Coumadin for chronic PE/DVT who is being seen for an INR check.  Today patient's INR is 2.9 on Coumadin 2 mg daily with 2.5 mg on Monday.  There are no complaints from nurses about patient.  Past Medical History:  Diagnosis Date  . Anemia 03/02/2012  . Angina pectoris, unstable ([facility]) 03/02/2012  . Anxiety   . Axillary abscess   . Bradycardia 06/13/2012  . CAD (coronary artery disease)    S/p PTCA / stenting (last cath 2004, multiple LAD stents, 2 stents in the right coronary artery all patent)  . Cancer ([facility])   . Complication of anesthesia   . Compression fracture of L2 ([facility] [place]) 03/12/2017  . Dementia ([place] [facility])   . Dysphagia 10/06/2012  . Dyspnea 02/03/2012  . GERD (gastroesophageal reflux disease)   . History of pulmonary embolus (PE) 03/19/2017  . HTN (hypertension)   . PMR (polymyalgia rheumatica) (HCC)   . PONV (postoperative nausea and vomiting)   . Restless leg syndrome   . 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 Started  coumadin  . Weakness of both legs 06/09/2012    Past Surgical History:  Procedure Laterality Date  . BREAST LUMPECTOMY    . COLECTOMY    . ESOPHAGOGASTRODUODENOSCOPY (EGD) WITH ESOPHAGEAL DILATION  10/10/2012   Procedure: ESOPHAGOGASTRODUODENOSCOPY (EGD) WITH ESOPHAGEAL DILATION;  Surgeon: Inda Castle, MD;  Location: [facility];  Service: Endoscopy;  Laterality: N/A;  . FEMUR IM NAIL Left 12/13/2018   Procedure: INTRAMEDULLARY (IM) NAIL FEMORAL LEFT;  Surgeon: Paralee Cancel, MD;  [facility];  Service: Orthopedics;  Laterality: Left;  . heart stents  x 8  . INCISION AND DRAINAGE     bilateral axillary, non specific staff  . INCISION AND DRAINAGE ABSCESS  09/28/2012   Procedure: INCISION AND DRAINAGE ABSCESS;  Surgeon: Zenovia Jarred, MD;  Location: [facility];  Service: General;  Laterality: Bilateral;  . stent     cardiac x 8 stents.    Allergies as of 08/20/2019      Reactions   Ambien [zolpidem Tartrate] Nausea And Vomiting   Codeine    Codeine Phosphate Nausea And Vomiting   Darvocet [propoxyphene N-acetaminophen] Nausea And Vomiting   Fish Allergy    Fish-derived Products    Hydrocodone    Promethazine    Promethazine Hcl Nausea And Vomiting   Sulfamethoxazole Other (See Comments)   Pt doesn't remember reaction   Vicodin [hydrocodone-acetaminophen] Other (See Comments)   Unknown reaction   Amoxicillin Nausea And Vomiting, Rash   Penicillins Nausea And Vomiting, Rash      Medication List       Accurate as  of August 20, 2019  4:28 PM. If you have any questions, ask your nurse or doctor.        acetaminophen 325 MG tablet Commonly known as: TYLENOL Take 650 mg by mouth every 6 (six) hours as needed for mild pain, moderate pain or fever.   acetaminophen 325 MG tablet Commonly known as: TYLENOL Take 650 mg by mouth every 6 (six) hours. Take routinely for left hip pain   ALPRAZolam 0.25 MG tablet Commonly known as: XANAX Take 0.25 mg by mouth daily.    amLODipine 10 MG tablet Commonly known as: NORVASC Take 10 mg by mouth every morning.   aspirin EC 81 MG tablet Take 1 tablet (81 mg total) by mouth daily.   atorvastatin 10 MG tablet Commonly known as: LIPITOR Take 10 mg by mouth every evening.   donepezil 10 MG tablet Commonly known as: ARICEPT Take 10 mg by mouth every evening.   losartan 50 MG tablet Commonly known as: COZAAR Take 50 mg by mouth at bedtime.   nitroGLYCERIN 0.4 MG SL tablet Commonly known as: NITROSTAT Place 0.4 mg under the tongue every 5 (five) minutes as needed for chest pain.   NON FORMULARY Diet Type:  Liberalized due to poor appetite to Mech soft   NUTRITIONAL SUPPLEMENT PO Take 1 each by mouth 2 (two) times a day. Magic Cup - take with lunch and dinner   ondansetron 4 MG tablet Commonly known as: ZOFRAN Take 4 mg by mouth every 4 (four) hours as needed for nausea.   oxybutynin 5 MG tablet Commonly known as: DITROPAN Take 5 mg by mouth 2 (two) times daily.   polyethylene glycol 17 g packet Commonly known as: MIRALAX / GLYCOLAX Take 17 g by mouth daily as needed for moderate constipation.   predniSONE 1 MG tablet Commonly known as: DELTASONE Take 1 mg by mouth daily with breakfast.   ProAir HFA 108 (90 Base) MCG/ACT inhaler Generic drug: albuterol Inhale 2 puffs into the lungs every 6 (six) hours as needed for wheezing or shortness of breath.   rOPINIRole 1 MG tablet Commonly known as: REQUIP Take 1 mg by mouth See admin instructions. 1mg  at noon, 1mg  2 hours before bed, then 1mg  at bedtime   senna-docusate 8.6-50 MG tablet Commonly known as: Senokot-S Take 1 tablet by mouth 2 (two) times daily.   Silenor 6 MG Tabs Generic drug: Doxepin HCl Take 0.5 tablets (3 mg total) by mouth at bedtime.   traMADol 50 MG tablet Commonly known as: ULTRAM Take 1 tablet (50 mg total) by mouth 2 (two) times daily.   Tums 500 MG chewable tablet Generic drug: calcium carbonate Chew 1 tablet by  mouth 3 (three) times daily as needed for indigestion or heartburn.   Vitamin D3 1.25 MG (50000 UT) Caps Take 1 capsule by mouth once a week.   warfarin 2 MG tablet Commonly known as: COUMADIN Take 2 mg by mouth daily. What changed: Another medication with the same name was removed. Continue taking this medication, and follow the directions you see here. Changed by: Inocencio Homes, MD       No orders of the defined types were placed in this encounter.   Immunization History  Administered Date(s) Administered  . Influenza-Unspecified 08/11/2017, 07/02/2018, 08/02/2019  . PPD Test 03/18/2017  . Pneumococcal Conjugate-13 09/17/2017  . Pneumococcal Polysaccharide-23 04/17/2019    Social History   Tobacco Use  . Smoking status: Never Smoker  . Smokeless tobacco: Never Used  Substance  Use Topics  . Alcohol use: No     Vitals:   08/20/19 1600  BP: (!) 145/76  Pulse: (!) 56  Resp: 18  Temp: (!) 97.3 F (36.3 C)   Body mass index is 24.81 kg/m.   Patient Active Problem List   Diagnosis Date Noted  . Failure to thrive in adult 01/08/2019  . Moderate protein malnutrition ([place]) 01/08/2019  . Pressure injury of skin 12/18/2018  . Intertrochanteric fracture of left femur ([place]) 12/11/2018  . CKD (chronic kidney disease), stage III 12/11/2018  . Depression with anxiety 12/11/2018  . Overactive bladder 09/24/2017  . Insomnia 07/31/2017  . Anxiety 06/16/2017  . Citrobacter infection 03/19/2017  . AKI (acute kidney injury) ([place]) 03/19/2017  . History of pulmonary embolus (PE) 03/19/2017  . Elevated INR 03/19/2017  . Candidal intertrigo 03/19/2017  . Compression fracture of L2 ([facility]) 03/12/2017  . Acute lower UTI 03/12/2017  . Accelerated hypertension 03/12/2017  . Leukocytosis 07/31/2015  . Acute bronchitis 07/28/2015  . Chronic anticoagulation 07/28/2015  . Intractable back pain 07/20/2015  . Back pain 07/20/2015  . Cellulitis and abscess of left buttock 04/12/2013   . Stricture and stenosis of esophagus 10/10/2012  . Dementia ([facility]) 10/06/2012  . Dysphagia 10/06/2012  . Axillary abscess - bilateral, multiple 09/27/2012  . Hypotension 08/24/2012  . Near syncope 08/24/2012  . Abscess of axilla, left 06/27/2012  . Bradycardia 06/13/2012  . Abscess 06/11/2012  . Chest pain 06/09/2012  . Cellulitis 06/09/2012  . Weakness of both legs 06/09/2012  . PMR (polymyalgia rheumatica) (HCC)   . Restless leg syndrome   . Angina pectoris, unstable ([facility]) 03/02/2012  . Acute blood loss as cause of postoperative anemia 03/02/2012  . Dyspnea 02/03/2012  . UTI (urinary tract infection) 02/03/2012  . CAD (coronary artery disease)   . HTN (hypertension)   . VTE (venous thromboembolism) 10/01/2010  . HYPERCHOLESTEROLEMIA  IIA 08/27/2009  . Hyperlipidemia 12/11/2008  . HYPERTENSION, BENIGN 12/11/2008    CMP     Component Value Date/Time   NA 141 06/12/2019   K 4.3 06/12/2019   CL 105 12/17/2018 0417   CO2 22 12/17/2018 0417   GLUCOSE 104 (H) 12/17/2018 0417   BUN 25 (A) 06/12/2019   CREATININE 0.8 06/12/2019   CREATININE 0.99 12/17/2018 0417   CALCIUM 7.7 (L) 12/17/2018 0417   PROT 6.7 03/11/2017 2031   ALBUMIN 3.4 (L) 03/11/2017 2031   AST 14 06/12/2019   ALT 12 06/12/2019   ALKPHOS 49 06/12/2019   BILITOT 0.5 03/11/2017 2031   GFRNONAA 48 (L) 12/17/2018 0417   GFRAA 56 (L) 12/17/2018 0417   Recent Labs    12/14/18 0536 12/15/18 0911 12/16/18 0611 12/17/18 0417 12/27/18 06/12/19  NA 138 138 136 135 142 141  K 3.9 3.4* 4.0 4.1 3.6 4.3  CL 110 108 108 105  --   --   CO2 22 23 22 22   --   --   GLUCOSE 144* 85 96 104*  --   --   BUN 25* 25* 21 20 32* 25*  CREATININE 1.13* 0.81 0.86 0.99 0.9 0.8  CALCIUM 8.3* 7.8* 7.8* 7.7*  --   --   MG 2.1 2.3 2.2  --   --   --    Recent Labs    06/12/19  AST 14  ALT 12  ALKPHOS 49   Recent Labs    12/16/18 0611 12/17/18 0417 12/18/18 0408 12/27/18 06/12/19  WBC 7.4 7.3 8.2 12.2 7.0  NEUTROABS   --  4.7 5.3  --  3  HGB 7.3* 9.4* 9.5* 9.0* 11.1*  HCT 24.5* 31.3* 31.4* 28* 35*  MCV 83.3 83.2 83.3  --   --   PLT 282 265 257 404* 279   No results for input(s): CHOL, LDLCALC, TRIG in the last 8760 hours.  Invalid input(s): HCL No results found for: [facility] Lab Results  Component Value Date   TSH 1.71 08/01/2018   Lab Results  Component Value Date   HGBA1C 5.7 08/01/2018   Lab Results  Component Value Date   CHOL 140 08/01/2018   HDL 46 08/01/2018   LDLCALC 80 08/01/2018   TRIG 82 08/01/2018   CHOLHDL 2.4 03/03/2012    Significant Diagnostic Results in last 30 days:  No results found.  Assessment and Plan  Chronic PE/DVT/anticoagulated on Coumadin-today patient is INR 2.9 she is a little on the high side for her; will hold Coumadin tonight then start back at two-point milligrams daily repeat INR in 1 week     Hennie Duos , MD

## 2019-08-22 DIAGNOSIS — F419 Anxiety disorder, unspecified: Secondary | ICD-10-CM | POA: Diagnosis not present

## 2019-08-22 DIAGNOSIS — F0390 Unspecified dementia without behavioral disturbance: Secondary | ICD-10-CM | POA: Diagnosis not present

## 2019-08-22 DIAGNOSIS — G4700 Insomnia, unspecified: Secondary | ICD-10-CM | POA: Diagnosis not present

## 2019-08-25 ENCOUNTER — Encounter: Payer: Self-pay | Admitting: Internal Medicine

## 2019-08-27 ENCOUNTER — Non-Acute Institutional Stay (SKILLED_NURSING_FACILITY): Payer: Medicare Other | Admitting: Internal Medicine

## 2019-08-27 ENCOUNTER — Encounter: Payer: Self-pay | Admitting: Internal Medicine

## 2019-08-27 DIAGNOSIS — I8290 Acute embolism and thrombosis of unspecified vein: Secondary | ICD-10-CM | POA: Diagnosis not present

## 2019-08-27 DIAGNOSIS — Z7901 Long term (current) use of anticoagulants: Secondary | ICD-10-CM | POA: Diagnosis not present

## 2019-08-27 DIAGNOSIS — Z86711 Personal history of pulmonary embolism: Secondary | ICD-10-CM | POA: Diagnosis not present

## 2019-08-27 NOTE — Progress Notes (Signed)
Location:  [facility] Room Number: 204/D Place of Service:  SNF (31)  Hennie Duos, MD  Patient Care Team: Hennie Duos, MD as PCP - General (Internal Medicine)  Extended Emergency Contact Information Primary Emergency Contact: Brunn,[name] Address: [address]          [place], [place] [address] [name] of [place] Phone: [phone] Mobile Phone: [phone] Relation: Son Secondary Emergency Contact: [place] of [place] Phone: [phone] Relation: Daughter    Allergies: Ambien [zolpidem tartrate], Codeine, Codeine phosphate, Darvocet [propoxyphene n-acetaminophen], Fish allergy, Fish-derived products, Hydrocodone, Promethazine, Promethazine hcl, Sulfamethoxazole, Vicodin [hydrocodone-acetaminophen], Amoxicillin, and Penicillins  Chief Complaint  Patient presents with  . Acute Visit    HPI: Patient is 85 y.o. female who is being seen for INR check.  Patient's INR today is 2.7.  There are no reported problems.  Patient is on Coumadin for chronic PE/DVT.  Past Medical History:  Diagnosis Date  . Anemia 03/02/2012  . Angina pectoris, unstable ([place]) 03/02/2012  . Anxiety   . Axillary abscess   . Bradycardia 06/13/2012  . CAD (coronary artery disease)    S/p PTCA / stenting (last cath 2004, multiple LAD stents, 2 stents in the right coronary artery all patent)  . Cancer ([place])   . Complication of anesthesia   . Compression fracture of L2 ([place]) 03/12/2017  . Dementia ([place])   . Dysphagia 10/06/2012  . Dyspnea 02/03/2012  . GERD (gastroesophageal reflux disease)   . History of pulmonary embolus (PE) 03/19/2017  . HTN (hypertension)   . PMR (polymyalgia rheumatica) (HCC)   . PONV (postoperative nausea and vomiting)   . Restless leg syndrome   . 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 Started coumadin  . Weakness of both legs 06/09/2012    Past Surgical History:  Procedure  Laterality Date  . BREAST LUMPECTOMY    . COLECTOMY    . ESOPHAGOGASTRODUODENOSCOPY (EGD) WITH ESOPHAGEAL DILATION  10/10/2012   Procedure: ESOPHAGOGASTRODUODENOSCOPY (EGD) WITH ESOPHAGEAL DILATION;  Surgeon: Inda Castle, MD;  Location: [facility];  Service: Endoscopy;  Laterality: N/A;  . FEMUR IM NAIL Left 12/13/2018   Procedure: INTRAMEDULLARY (IM) NAIL FEMORAL LEFT;  Surgeon: Paralee Cancel, MD;  [facility];  Service: Orthopedics;  Laterality: Left;  . heart stents  x 8  . INCISION AND DRAINAGE     bilateral axillary, non specific staff  . INCISION AND DRAINAGE ABSCESS  09/28/2012   Procedure: INCISION AND DRAINAGE ABSCESS;  Surgeon: Zenovia Jarred, MD;  Location: [facility];  Service: General;  Laterality: Bilateral;  . stent     cardiac x 8 stents.    Allergies as of 08/27/2019      Reactions   Ambien [zolpidem Tartrate] Nausea And Vomiting   Codeine    Codeine Phosphate Nausea And Vomiting   Darvocet [propoxyphene N-acetaminophen] Nausea And Vomiting   Fish Allergy    Fish-derived Products    Hydrocodone    Promethazine    Promethazine Hcl Nausea And Vomiting   Sulfamethoxazole Other (See Comments)   Pt doesn't remember reaction   Vicodin [hydrocodone-acetaminophen] Other (See Comments)   Unknown reaction   Amoxicillin Nausea And Vomiting, Rash   Penicillins Nausea And Vomiting, Rash      Medication List       Accurate as of August 27, 2019 11:59 PM. If you have any questions, ask your nurse or doctor.  acetaminophen 325 MG tablet Commonly known as: TYLENOL Take 650 mg by mouth every 6 (six) hours as needed for mild pain, moderate pain or fever.   acetaminophen 325 MG tablet Commonly known as: TYLENOL Take 650 mg by mouth every 6 (six) hours. Take routinely for left hip pain   ALPRAZolam 0.25 MG tablet Commonly known as: XANAX Take 0.25 mg by mouth daily.   amLODipine 10 MG tablet Commonly known as: NORVASC Take 10 mg by mouth every  morning.   aspirin EC 81 MG tablet Take 1 tablet (81 mg total) by mouth daily.   atorvastatin 10 MG tablet Commonly known as: LIPITOR Take 10 mg by mouth every evening.   donepezil 10 MG tablet Commonly known as: ARICEPT Take 10 mg by mouth every evening.   losartan 50 MG tablet Commonly known as: COZAAR Take 50 mg by mouth at bedtime.   nitroGLYCERIN 0.4 MG SL tablet Commonly known as: NITROSTAT Place 0.4 mg under the tongue every 5 (five) minutes as needed for chest pain.   NON FORMULARY Diet Type:  Liberalized due to poor appetite to Mech soft   NUTRITIONAL SUPPLEMENT PO Take 1 each by mouth 2 (two) times a day. Magic Cup - take with lunch and dinner   ondansetron 4 MG tablet Commonly known as: ZOFRAN Take 4 mg by mouth every 4 (four) hours as needed for nausea.   oxybutynin 5 MG tablet Commonly known as: DITROPAN Take 5 mg by mouth 2 (two) times daily.   polyethylene glycol 17 g packet Commonly known as: MIRALAX / GLYCOLAX Take 17 g by mouth daily as needed for moderate constipation.   predniSONE 1 MG tablet Commonly known as: DELTASONE Take 1 mg by mouth daily with breakfast.   ProAir HFA 108 (90 Base) MCG/ACT inhaler Generic drug: albuterol Inhale 2 puffs into the lungs every 6 (six) hours as needed for wheezing or shortness of breath.   rOPINIRole 1 MG tablet Commonly known as: REQUIP Take 1 mg by mouth See admin instructions. 1mg  at noon, 1mg  2 hours before bed, then 1mg  at bedtime   senna-docusate 8.6-50 MG tablet Commonly known as: Senokot-S Take 1 tablet by mouth 2 (two) times daily.   Silenor 6 MG Tabs Generic drug: Doxepin HCl Take 0.5 tablets (3 mg total) by mouth at bedtime.   traMADol 50 MG tablet Commonly known as: ULTRAM Take 1 tablet (50 mg total) by mouth 2 (two) times daily.   Tums 500 MG chewable tablet Generic drug: calcium carbonate Chew 1 tablet by mouth 3 (three) times daily as needed for indigestion or heartburn.   Vitamin  D3 1.25 MG (50000 UT) Caps Take 1 capsule by mouth once a week.   warfarin 2 MG tablet Commonly known as: COUMADIN Take 2 mg by mouth daily.       No orders of the defined types were placed in this encounter.   Immunization History  Administered Date(s) Administered  . Influenza-Unspecified 08/11/2017, 07/02/2018, 08/02/2019  . PPD Test 03/18/2017  . Pneumococcal Conjugate-13 09/17/2017  . Pneumococcal Polysaccharide-23 04/17/2019    Social History   Tobacco Use  . Smoking status: Never Smoker  . Smokeless tobacco: Never Used  Substance Use Topics  . Alcohol use: No     Vitals:   08/27/19 1112  BP: (!) 109/58  Pulse: (!) 48  Resp: 18  Temp: (!) 97.1 F (36.2 C)  SpO2: 96%   Body mass index is 24.81 kg/m.   Patient Active Problem  List   Diagnosis Date Noted  . Failure to thrive in adult 01/08/2019  . Moderate protein malnutrition ([place]) 01/08/2019  . Pressure injury of skin 12/18/2018  . Intertrochanteric fracture of left femur ([place]) 12/11/2018  . CKD (chronic kidney disease), stage III 12/11/2018  . Depression with anxiety 12/11/2018  . Overactive bladder 09/24/2017  . Insomnia 07/31/2017  . Anxiety 06/16/2017  . Citrobacter infection 03/19/2017  . AKI (acute kidney injury) ([place]) 03/19/2017  . History of pulmonary embolus (PE) 03/19/2017  . Elevated INR 03/19/2017  . Candidal intertrigo 03/19/2017  . Compression fracture of L2 ([place]) 03/12/2017  . Acute lower UTI 03/12/2017  . Accelerated hypertension 03/12/2017  . Leukocytosis 07/31/2015  . Acute bronchitis 07/28/2015  . Chronic anticoagulation 07/28/2015  . Intractable back pain 07/20/2015  . Back pain 07/20/2015  . Cellulitis and abscess of left buttock 04/12/2013  . Stricture and stenosis of esophagus 10/10/2012  . Dementia ([place]) 10/06/2012  . Dysphagia 10/06/2012  . Axillary abscess - bilateral, multiple 09/27/2012  . Hypotension 08/24/2012  . Near syncope 08/24/2012  . Abscess of axilla,  left 06/27/2012  . Bradycardia 06/13/2012  . Abscess 06/11/2012  . Chest pain 06/09/2012  . Cellulitis 06/09/2012  . Weakness of both legs 06/09/2012  . PMR (polymyalgia rheumatica) (HCC)   . Restless leg syndrome   . Angina pectoris, unstable ([place]) 03/02/2012  . Acute blood loss as cause of postoperative anemia 03/02/2012  . Dyspnea 02/03/2012  . UTI (urinary tract infection) 02/03/2012  . CAD (coronary artery disease)   . HTN (hypertension)   . VTE (venous thromboembolism) 10/01/2010  . HYPERCHOLESTEROLEMIA  IIA 08/27/2009  . Hyperlipidemia 12/11/2008  . HYPERTENSION, BENIGN 12/11/2008    CMP     Component Value Date/Time   NA 141 06/12/2019   K 4.3 06/12/2019   CL 105 12/17/2018 0417   CO2 22 12/17/2018 0417   GLUCOSE 104 (H) 12/17/2018 0417   BUN 25 (A) 06/12/2019   CREATININE 0.8 06/12/2019   CREATININE 0.99 12/17/2018 0417   CALCIUM 7.7 (L) 12/17/2018 0417   PROT 6.7 03/11/2017 2031   ALBUMIN 3.4 (L) 03/11/2017 2031   AST 14 06/12/2019   ALT 12 06/12/2019   ALKPHOS 49 06/12/2019   BILITOT 0.5 03/11/2017 2031   GFRNONAA 48 (L) 12/17/2018 0417   GFRAA 56 (L) 12/17/2018 0417   Recent Labs    12/14/18 0536 12/15/18 0911 12/16/18 0611 12/17/18 0417 12/27/18 06/12/19  NA 138 138 136 135 142 141  K 3.9 3.4* 4.0 4.1 3.6 4.3  CL 110 108 108 105  --   --   CO2 22 23 22 22   --   --   GLUCOSE 144* 85 96 104*  --   --   BUN 25* 25* 21 20 32* 25*  CREATININE 1.13* 0.81 0.86 0.99 0.9 0.8  CALCIUM 8.3* 7.8* 7.8* 7.7*  --   --   MG 2.1 2.3 2.2  --   --   --    Recent Labs    06/12/19  AST 14  ALT 12  ALKPHOS 49   Recent Labs    12/16/18 0611 12/17/18 0417 12/18/18 0408 12/27/18 06/12/19  WBC 7.4 7.3 8.2 12.2 7.0  NEUTROABS  --  4.7 5.3  --  3  HGB 7.3* 9.4* 9.5* 9.0* 11.1*  HCT 24.5* 31.3* 31.4* 28* 35*  MCV 83.3 83.2 83.3  --   --   PLT 282 265 257 404* 279   No results for  input(s): CHOL, LDLCALC, TRIG in the last 8760 hours.  Invalid input(s): HCL  No results found for: [facility] Lab Results  Component Value Date   TSH 1.71 08/01/2018   Lab Results  Component Value Date   HGBA1C 5.7 08/01/2018   Lab Results  Component Value Date   CHOL 140 08/01/2018   HDL 46 08/01/2018   LDLCALC 80 08/01/2018   TRIG 82 08/01/2018   CHOLHDL 2.4 03/03/2012    Significant Diagnostic Results in last 30 days:  No results found.  Assessment and Plan  Chronic PE/DVT/anticoagulation management-patient's INR today is 2.7 on Coumadin 2 mg daily except for 2.5 mg on Monday; plan to continue current regimen and repeat INR in 1 week    Hennie Duos , MD

## 2019-08-28 ENCOUNTER — Non-Acute Institutional Stay (SKILLED_NURSING_FACILITY): Payer: Medicare Other | Admitting: Internal Medicine

## 2019-08-28 ENCOUNTER — Encounter: Payer: Self-pay | Admitting: Internal Medicine

## 2019-08-28 DIAGNOSIS — I2510 Atherosclerotic heart disease of native coronary artery without angina pectoris: Secondary | ICD-10-CM | POA: Diagnosis not present

## 2019-08-28 DIAGNOSIS — I8290 Acute embolism and thrombosis of unspecified vein: Secondary | ICD-10-CM | POA: Diagnosis not present

## 2019-08-28 DIAGNOSIS — I10 Essential (primary) hypertension: Secondary | ICD-10-CM | POA: Diagnosis not present

## 2019-08-28 NOTE — Progress Notes (Signed)
Location:  [facility] Room Number: 204-D Place of Service:  SNF (31)  Hennie Duos, MD  Patient Care Team: Hennie Duos, MD as PCP - General (Internal Medicine)  Extended Emergency Contact Information Primary Emergency Contact: Longoria,[name] Address: [address]          [place],  [address] [name] of [place] Phone: [phone] Mobile Phone: [phone] Relation: Son Secondary Emergency Contact: [place] of [place] Phone: [phone] Relation: Daughter    Allergies: Ambien [zolpidem tartrate], Codeine, Codeine phosphate, Darvocet [propoxyphene n-acetaminophen], Fish allergy, Fish-derived products, Hydrocodone, Promethazine, Promethazine hcl, Sulfamethoxazole, Vicodin [hydrocodone-acetaminophen], Amoxicillin, and Penicillins  Chief Complaint  Patient presents with  . Medical Management of Chronic Issues    Routine [name] visit    HPI: Patient is a 86 y.o. female who is being seen for routine issues of CAD, hypertension, and VTE.  Past Medical History:  Diagnosis Date  . Anemia 03/02/2012  . Angina pectoris, unstable ([place]) 03/02/2012  . Anxiety   . Axillary abscess   . Bradycardia 06/13/2012  . CAD (coronary artery disease)    S/p PTCA / stenting (last cath 2004, multiple LAD stents, 2 stents in the right coronary artery all patent)  . Cancer ([place])   . Complication of anesthesia   . Compression fracture of L2 ([place]) 03/12/2017  . Dementia ([place])   . Dysphagia 10/06/2012  . Dyspnea 02/03/2012  . GERD (gastroesophageal reflux disease)   . History of pulmonary embolus (PE) 03/19/2017  . HTN (hypertension)   . PMR (polymyalgia rheumatica) (HCC)   . PONV (postoperative nausea and vomiting)   . Restless leg syndrome   . 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 Started coumadin  . Weakness of both legs 06/09/2012    Past Surgical History:  Procedure Laterality Date  .  BREAST LUMPECTOMY    . COLECTOMY    . ESOPHAGOGASTRODUODENOSCOPY (EGD) WITH ESOPHAGEAL DILATION  10/10/2012   Procedure: ESOPHAGOGASTRODUODENOSCOPY (EGD) WITH ESOPHAGEAL DILATION;  Surgeon: Inda Castle, MD;  Location: [facility];  Service: Endoscopy;  Laterality: N/A;  . FEMUR IM NAIL Left 12/13/2018   Procedure: INTRAMEDULLARY (IM) NAIL FEMORAL LEFT;  Surgeon: Paralee Cancel, MD;  [facility];  Service: Orthopedics;  Laterality: Left;  . heart stents  x 8  . INCISION AND DRAINAGE     bilateral axillary, non specific staff  . INCISION AND DRAINAGE ABSCESS  09/28/2012   Procedure: INCISION AND DRAINAGE ABSCESS;  Surgeon: Zenovia Jarred, MD;  Location: [facility];  Service: General;  Laterality: Bilateral;  . stent     cardiac x 8 stents.    Allergies as of 08/28/2019      Reactions   Ambien [zolpidem Tartrate] Nausea And Vomiting   Codeine    Codeine Phosphate Nausea And Vomiting   Darvocet [propoxyphene N-acetaminophen] Nausea And Vomiting   Fish Allergy    Fish-derived Products    Hydrocodone    Promethazine    Promethazine Hcl Nausea And Vomiting   Sulfamethoxazole Other (See Comments)   Pt doesn't remember reaction   Vicodin [hydrocodone-acetaminophen] Other (See Comments)   Unknown reaction   Amoxicillin Nausea And Vomiting, Rash   Penicillins Nausea And Vomiting, Rash      Medication List       Accurate as of August 28, 2019 11:59 PM. If you have any questions, ask your nurse or doctor.  acetaminophen 325 MG tablet Commonly known as: TYLENOL Take 650 mg by mouth every 6 (six) hours as needed for mild pain, moderate pain or fever.   acetaminophen 325 MG tablet Commonly known as: TYLENOL Take 650 mg by mouth every 6 (six) hours. Take routinely for left hip pain   ALPRAZolam 0.25 MG tablet Commonly known as: XANAX Take 0.25 mg by mouth daily.   amLODipine 10 MG tablet Commonly known as: NORVASC Take 10 mg by mouth every morning.   aspirin EC 81  MG tablet Take 1 tablet (81 mg total) by mouth daily.   atorvastatin 10 MG tablet Commonly known as: LIPITOR Take 10 mg by mouth every evening.   donepezil 10 MG tablet Commonly known as: ARICEPT Take 10 mg by mouth every evening.   losartan 50 MG tablet Commonly known as: COZAAR Take 50 mg by mouth at bedtime.   nitroGLYCERIN 0.4 MG SL tablet Commonly known as: NITROSTAT Place 0.4 mg under the tongue every 5 (five) minutes as needed for chest pain.   NON FORMULARY Diet Type:  Liberalized due to poor appetite to Mech soft   NUTRITIONAL SUPPLEMENT PO Take 1 each by mouth 2 (two) times a day. Magic Cup - take with lunch and dinner   ondansetron 4 MG tablet Commonly known as: ZOFRAN Take 4 mg by mouth every 4 (four) hours as needed for nausea.   oxybutynin 5 MG tablet Commonly known as: DITROPAN Take 5 mg by mouth 2 (two) times daily.   polyethylene glycol 17 g packet Commonly known as: MIRALAX / GLYCOLAX Take 17 g by mouth daily as needed for moderate constipation.   predniSONE 1 MG tablet Commonly known as: DELTASONE Take 1 mg by mouth daily with breakfast.   ProAir HFA 108 (90 Base) MCG/ACT inhaler Generic drug: albuterol Inhale 2 puffs into the lungs every 6 (six) hours as needed for wheezing or shortness of breath.   rOPINIRole 1 MG tablet Commonly known as: REQUIP Take 1 mg by mouth See admin instructions. 1mg  at noon, 1mg  2 hours before bed, then 1mg  at bedtime   senna-docusate 8.6-50 MG tablet Commonly known as: Senokot-S Take 1 tablet by mouth 2 (two) times daily.   Silenor 6 MG Tabs Generic drug: Doxepin HCl Take 0.5 tablets (3 mg total) by mouth at bedtime.   traMADol 50 MG tablet Commonly known as: ULTRAM Take 1 tablet (50 mg total) by mouth 2 (two) times daily.   Tums 500 MG chewable tablet Generic drug: calcium carbonate Chew 1 tablet by mouth 3 (three) times daily as needed for indigestion or heartburn.   Vitamin D3 1.25 MG (50000 UT) Caps  Take 1 capsule by mouth once a week.   warfarin 2 MG tablet Commonly known as: COUMADIN Take 2 mg by mouth daily.       No orders of the defined types were placed in this encounter.   Immunization History  Administered Date(s) Administered  . Influenza-Unspecified 08/11/2017, 07/02/2018, 08/02/2019  . PPD Test 03/18/2017  . Pneumococcal Conjugate-13 09/17/2017  . Pneumococcal Polysaccharide-23 04/17/2019    Social History   Tobacco Use  . Smoking status: Never Smoker  . Smokeless tobacco: Never Used  Substance Use Topics  . Alcohol use: No    Review of Systems  DATA OBTAINED: from patient, nurse GENERAL:  no fevers, fatigue, appetite changes SKIN: No itching, rash HEENT: No complaint RESPIRATORY: No cough, wheezing, SOB CARDIAC: No chest pain, palpitations, lower extremity edema  GI: No abdominal  pain, No N/V/D or constipation, No heartburn or reflux  GU: No dysuria, frequency or urgency, or incontinence  MUSCULOSKELETAL: No unrelieved bone/joint pain NEUROLOGIC: No headache, dizziness  PSYCHIATRIC: No overt anxiety or sadness  Vitals:   08/28/19 1628  BP: 135/73  Pulse: 66  Resp: 18  Temp: (!) 97.2 F (36.2 C)   Body mass index is 24.81 kg/m. Physical Exam  GENERAL APPEARANCE: Alert, conversant, No acute distress  SKIN: No diaphoresis rash HEENT: Unremarkable RESPIRATORY: Breathing is even, unlabored. Lung sounds are clear   CARDIOVASCULAR: Heart RRR no murmurs, rubs or gallops. No peripheral edema  GASTROINTESTINAL: Abdomen is soft, non-tender, not distended w/ normal bowel sounds.  GENITOURINARY: Bladder non tender, not distended  MUSCULOSKELETAL: No abnormal joints or musculature NEUROLOGIC: Cranial nerves 2-12 grossly intact. Moves all extremities PSYCHIATRIC: Mood and affect appropriate with dementia, no behavioral issues  Patient Active Problem List   Diagnosis Date Noted  . Failure to thrive in adult 01/08/2019  . Moderate protein  malnutrition ([place]) 01/08/2019  . Pressure injury of skin 12/18/2018  . Intertrochanteric fracture of left femur ([place]) 12/11/2018  . CKD (chronic kidney disease), stage III 12/11/2018  . Depression with anxiety 12/11/2018  . Overactive bladder 09/24/2017  . Insomnia 07/31/2017  . Anxiety 06/16/2017  . Citrobacter infection 03/19/2017  . AKI (acute kidney injury) ([place]) 03/19/2017  . History of pulmonary embolus (PE) 03/19/2017  . Elevated INR 03/19/2017  . Candidal intertrigo 03/19/2017  . Compression fracture of L2 ([place]) 03/12/2017  . Acute lower UTI 03/12/2017  . Accelerated hypertension 03/12/2017  . Leukocytosis 07/31/2015  . Acute bronchitis 07/28/2015  . Chronic anticoagulation 07/28/2015  . Intractable back pain 07/20/2015  . Back pain 07/20/2015  . Cellulitis and abscess of left buttock 04/12/2013  . Stricture and stenosis of esophagus 10/10/2012  . Dementia ([place]) 10/06/2012  . Dysphagia 10/06/2012  . Axillary abscess - bilateral, multiple 09/27/2012  . Hypotension 08/24/2012  . Near syncope 08/24/2012  . Abscess of axilla, left 06/27/2012  . Bradycardia 06/13/2012  . Abscess 06/11/2012  . Chest pain 06/09/2012  . Cellulitis 06/09/2012  . Weakness of both legs 06/09/2012  . PMR (polymyalgia rheumatica) (HCC)   . Restless leg syndrome   . Angina pectoris, unstable ([place]) 03/02/2012  . Acute blood loss as cause of postoperative anemia 03/02/2012  . Dyspnea 02/03/2012  . UTI (urinary tract infection) 02/03/2012  . CAD (coronary artery disease)   . HTN (hypertension)   . VTE (venous thromboembolism) 10/01/2010  . HYPERCHOLESTEROLEMIA  IIA 08/27/2009  . Hyperlipidemia 12/11/2008  . HYPERTENSION, BENIGN 12/11/2008    CMP     Component Value Date/Time   NA 141 06/12/2019   K 4.3 06/12/2019   CL 105 12/17/2018 0417   CO2 22 12/17/2018 0417   GLUCOSE 104 (H) 12/17/2018 0417   BUN 25 (A) 06/12/2019   CREATININE 0.8 06/12/2019   CREATININE 0.99 12/17/2018 0417    CALCIUM 7.7 (L) 12/17/2018 0417   PROT 6.7 03/11/2017 2031   ALBUMIN 3.4 (L) 03/11/2017 2031   AST 14 06/12/2019   ALT 12 06/12/2019   ALKPHOS 49 06/12/2019   BILITOT 0.5 03/11/2017 2031   GFRNONAA 48 (L) 12/17/2018 0417   GFRAA 56 (L) 12/17/2018 0417   Recent Labs    12/14/18 0536 12/15/18 0911 12/16/18 0611 12/17/18 0417 12/27/18 06/12/19  NA 138 138 136 135 142 141  K 3.9 3.4* 4.0 4.1 3.6 4.3  CL 110 108 108 105  --   --  CO2 22 23 22 22   --   --   GLUCOSE 144* 85 96 104*  --   --   BUN 25* 25* 21 20 32* 25*  CREATININE 1.13* 0.81 0.86 0.99 0.9 0.8  CALCIUM 8.3* 7.8* 7.8* 7.7*  --   --   MG 2.1 2.3 2.2  --   --   --    Recent Labs    06/12/19  AST 14  ALT 12  ALKPHOS 49   Recent Labs    12/16/18 0611 12/17/18 0417 12/18/18 0408 12/27/18 06/12/19  WBC 7.4 7.3 8.2 12.2 7.0  NEUTROABS  --  4.7 5.3  --  3  HGB 7.3* 9.4* 9.5* 9.0* 11.1*  HCT 24.5* 31.3* 31.4* 28* 35*  MCV 83.3 83.2 83.3  --   --   PLT 282 265 257 404* 279   No results for input(s): CHOL, LDLCALC, TRIG in the last 8760 hours.  Invalid input(s): HCL No results found for: [facility] Lab Results  Component Value Date   TSH 1.71 08/01/2018   Lab Results  Component Value Date   HGBA1C 5.7 08/01/2018   Lab Results  Component Value Date   CHOL 140 08/01/2018   HDL 46 08/01/2018   LDLCALC 80 08/01/2018   TRIG 82 08/01/2018   CHOLHDL 2.4 03/03/2012    Significant Diagnostic Results in last 30 days:  No results found.  Assessment and Plan  CAD (coronary artery disease) No reported chest pain; continue ASA 81 mg daily and sublingual nitro glycerin as needed; patient is on statin  HTN (hypertension) Controlled; continue Cozaar 50 mg daily and Norvasc 10 mg daily  VTE (venous thromboembolism) For VTE and PE, chronic stable; continue Coumadin which is actively titrated weekly  None   Hennie Duos, MD

## 2019-08-31 DIAGNOSIS — R2681 Unsteadiness on feet: Secondary | ICD-10-CM | POA: Diagnosis not present

## 2019-08-31 DIAGNOSIS — M6281 Muscle weakness (generalized): Secondary | ICD-10-CM | POA: Diagnosis not present

## 2019-08-31 DIAGNOSIS — S72142D Displaced intertrochanteric fracture of left femur, subsequent encounter for closed fracture with routine healing: Secondary | ICD-10-CM | POA: Diagnosis not present

## 2019-08-31 DIAGNOSIS — I2510 Atherosclerotic heart disease of native coronary artery without angina pectoris: Secondary | ICD-10-CM | POA: Diagnosis not present

## 2019-08-31 DIAGNOSIS — E785 Hyperlipidemia, unspecified: Secondary | ICD-10-CM | POA: Diagnosis not present

## 2019-09-01 ENCOUNTER — Encounter: Payer: Self-pay | Admitting: Internal Medicine

## 2019-09-01 NOTE — Assessment & Plan Note (Signed)
For VTE and PE, chronic stable; continue Coumadin which is actively titrated weekly

## 2019-09-01 NOTE — Assessment & Plan Note (Signed)
No reported chest pain; continue ASA 81 mg daily and sublingual nitro glycerin as needed; patient is on statin

## 2019-09-01 NOTE — Assessment & Plan Note (Signed)
Controlled; continue Cozaar 50 mg daily and Norvasc 10 mg daily

## 2019-09-03 ENCOUNTER — Encounter: Payer: Self-pay | Admitting: Internal Medicine

## 2019-09-03 ENCOUNTER — Non-Acute Institutional Stay (SKILLED_NURSING_FACILITY): Payer: Medicare Other | Admitting: Internal Medicine

## 2019-09-03 DIAGNOSIS — I10 Essential (primary) hypertension: Secondary | ICD-10-CM | POA: Diagnosis not present

## 2019-09-03 DIAGNOSIS — M6281 Muscle weakness (generalized): Secondary | ICD-10-CM | POA: Diagnosis not present

## 2019-09-03 DIAGNOSIS — Z7901 Long term (current) use of anticoagulants: Secondary | ICD-10-CM

## 2019-09-03 DIAGNOSIS — S72142D Displaced intertrochanteric fracture of left femur, subsequent encounter for closed fracture with routine healing: Secondary | ICD-10-CM | POA: Diagnosis not present

## 2019-09-03 DIAGNOSIS — E785 Hyperlipidemia, unspecified: Secondary | ICD-10-CM | POA: Diagnosis not present

## 2019-09-03 DIAGNOSIS — R2681 Unsteadiness on feet: Secondary | ICD-10-CM | POA: Diagnosis not present

## 2019-09-03 DIAGNOSIS — I2510 Atherosclerotic heart disease of native coronary artery without angina pectoris: Secondary | ICD-10-CM | POA: Diagnosis not present

## 2019-09-03 DIAGNOSIS — I8290 Acute embolism and thrombosis of unspecified vein: Secondary | ICD-10-CM

## 2019-09-03 NOTE — Progress Notes (Signed)
Location:  [facility] Room Number: 204-D Place of Service:  SNF [phone]) Provider:  Henreitta Leber, MD  Patient Care Team: Hennie Duos, MD as PCP - General (Internal Medicine)  Extended Emergency Contact Information Primary Emergency Contact: Haile,[name] Address: [place], [place] [address] [name] of [place] Phone: [phone] Mobile Phone: [phone] Relation: Son Secondary Emergency Contact: Dana of [place] Phone: [phone] Relation: Daughter  Code Status:  DNR Goals of care: Advanced Directive information Advanced Directives 08/28/2019  Does Patient Have a Medical Advance Directive? Yes  Type of Advance Directive Out of facility DNR (pink MOST or yellow form)  Does patient want to make changes to medical advance directive? No - Guardian declined  Copy of [place] in Chart? -  Pre-existing out of facility DNR order (yellow form or pink MOST form) Yellow form placed in chart (order not valid for inpatient use)     Chief Complaint  Patient presents with  . Acute Visit    Anticoagulation management  With history of VTE and pulmonary embolism  HPI:  Pt is a 85 y.o. 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  She is currently on Coumadin 2 mg a day it appears per chart review she is recently been on doses ranging from 2-up to 2-1/2 mg on various days.  She appears to have been fairly stable on 2 mg recently but today her INR is somewhat elevated at 3.2.  It was 2.7 last week.  It appears she has been getting 2 mg all days except 2.5 mg once a week but her  INR did go up to 2.9 at which time it was reduced to 2 mg a day and it did come down to 2.4-- then it was up to 2.7 last week and  is 3.2 today  There has been no evidence of  increased bruising or bleeding and she does not really have any complaints today.    Past Medical History:  Diagnosis Date  . Anemia 03/02/2012  . Angina pectoris, unstable ([place]) 03/02/2012  . Anxiety   . Axillary abscess   . Bradycardia 06/13/2012  . CAD (coronary artery disease)    S/p PTCA / stenting (last cath 2004, multiple LAD stents, 2 stents in the right coronary artery all patent)  . Cancer ([place])   . Complication of anesthesia   . Compression fracture of L2 ([place]) 03/12/2017  . Dementia ([place])   . Dysphagia 10/06/2012  . Dyspnea 02/03/2012  . GERD (gastroesophageal reflux disease)   . History of pulmonary embolus (PE) 03/19/2017  . HTN (hypertension)   . PMR (polymyalgia rheumatica) (HCC)   . PONV (postoperative nausea and vomiting)   . Restless leg syndrome   . 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 Started coumadin  . Weakness of both legs 06/09/2012   Past Surgical History:  Procedure Laterality Date  . BREAST LUMPECTOMY    . COLECTOMY    . ESOPHAGOGASTRODUODENOSCOPY (EGD) WITH ESOPHAGEAL DILATION  10/10/2012   Procedure: ESOPHAGOGASTRODUODENOSCOPY (EGD) WITH ESOPHAGEAL DILATION;  Surgeon: Inda Castle, MD;  Location: [facility];  Service: Endoscopy;  Laterality: N/A;  . FEMUR IM NAIL Left 12/13/2018   Procedure: INTRAMEDULLARY (IM) NAIL FEMORAL LEFT;  Surgeon: Paralee Cancel, MD;  Location[facility];  Service: Orthopedics;  Laterality: Left;  . heart stents  x 8  . INCISION AND DRAINAGE     bilateral axillary, non specific staff  . INCISION AND DRAINAGE ABSCESS  09/28/2012   Procedure: INCISION AND DRAINAGE ABSCESS;  Surgeon: Zenovia Jarred, MD;  Location: [facility];  Service: General;  Laterality: Bilateral;  . stent     cardiac x 8 stents.    Allergies  Allergen Reactions  . Ambien [Zolpidem Tartrate] Nausea And Vomiting  . Codeine   . Codeine Phosphate Nausea And Vomiting  . Darvocet [Propoxyphene N-Acetaminophen] Nausea And Vomiting  . Fish Allergy    . Fish-Derived Products   . Hydrocodone   . Promethazine   . Promethazine Hcl Nausea And Vomiting  . Sulfamethoxazole Other (See Comments)    Pt doesn't remember reaction  . Vicodin [Hydrocodone-Acetaminophen] Other (See Comments)    Unknown reaction  . Amoxicillin Nausea And Vomiting and Rash  . Penicillins Nausea And Vomiting and Rash    Outpatient Encounter Medications as of 09/03/2019  Medication Sig  . acetaminophen (TYLENOL) 325 MG tablet Take 650 mg by mouth every 6 (six) hours as needed for mild pain, moderate pain or fever.  Marland Kitchen acetaminophen (TYLENOL) 325 MG tablet Take 650 mg by mouth every 6 (six) hours. Take routinely for left hip pain  . albuterol (PROAIR HFA) 108 (90 Base) MCG/ACT inhaler Inhale 2 puffs into the lungs every 6 (six) hours as needed for wheezing or shortness of breath.  . ALPRAZolam (XANAX) 0.25 MG tablet Take 0.25 mg by mouth daily.  Marland Kitchen amLODipine (NORVASC) 10 MG tablet Take 10 mg by mouth every morning.   Marland Kitchen aspirin EC 81 MG tablet Take 1 tablet (81 mg total) by mouth daily.  Marland Kitchen atorvastatin (LIPITOR) 10 MG tablet Take 10 mg by mouth every evening.  . calcium carbonate (TUMS) 500 MG chewable tablet Chew 1 tablet by mouth 3 (three) times daily as needed for indigestion or heartburn.  . Cholecalciferol (VITAMIN D3) 1.25 MG (50000 UT) CAPS Take 1 capsule by mouth once a week.  . donepezil (ARICEPT) 10 MG tablet Take 10 mg by mouth every evening.   Marland Kitchen losartan (COZAAR) 50 MG tablet Take 50 mg by mouth at bedtime.   . nitroGLYCERIN (NITROSTAT) 0.4 MG SL tablet Place 0.4 mg under the tongue every 5 (five) minutes as needed for chest pain.  . NON FORMULARY Diet Type:  Liberalized due to poor appetite to Mech soft  . Nutritional Supplements (NUTRITIONAL SUPPLEMENT PO) Take 1 each by mouth 2 (two) times a day. Magic Cup - take with lunch and dinner  . ondansetron (ZOFRAN) 4 MG tablet Take 4 mg by mouth every 4 (four) hours as needed for nausea.   Marland Kitchen oxybutynin (DITROPAN)  5 MG tablet Take 5 mg by mouth 2 (two) times daily.   . polyethylene glycol (MIRALAX / GLYCOLAX) packet Take 17 g by mouth daily as needed for moderate constipation.  . predniSONE (DELTASONE) 1 MG tablet Take 1 mg by mouth daily with breakfast.   . rOPINIRole (REQUIP) 1 MG tablet Take 1 mg by mouth See admin instructions. 1mg  at noon, 1mg  2 hours before bed, then 1mg  at bedtime  . senna-docusate (SENOKOT-S) 8.6-50 MG tablet Take 1 tablet by mouth 2 (two) times daily.  Marland Kitchen SILENOR 6 MG TABS Take 0.5 tablets (3 mg total) by mouth at bedtime.  . traMADol (ULTRAM) 50 MG tablet Take 1 tablet (50 mg total) by  mouth 2 (two) times daily.  . [DISCONTINUED] warfarin (COUMADIN) 2 MG tablet Take 2 mg by mouth daily.    No facility-administered encounter medications on file as of 09/03/2019.     Review of Systems   General she is not complaining of any fever or chills.  Skin does not complain of increased rashes or itching or bruising.  Head ears eyes nose mouth and throat is not complain of visual changes or sore throat.  Respiratory denies shortness of breath or cough.  Cardiac is not complain of chest pain appears to have her chronic fairly mild lower extremity edema.  GI is not complaining of abdominal discomfort nausea vomiting diarrhea constipation.  Musculoskeletal does not complain of joint pain.  Neurologic is not complaining of dizziness headache or syncope.  And psych does have some mild cognitive impairment but does not complain of being depressed or anxious continues to be in good spirits coloring in her book  Immunization History  Administered Date(s) Administered  . Influenza-Unspecified 08/11/2017, 07/02/2018, 08/02/2019  . PPD Test 03/18/2017  . Pneumococcal Conjugate-13 09/17/2017  . Pneumococcal Polysaccharide-23 04/17/2019   Pertinent  Health Maintenance Due  Topic Date Due  . INFLUENZA VACCINE  Completed  . DEXA SCAN  Completed  . PNA vac Low Risk Adult  Completed    Fall Risk  03/14/2018  Falls in the past year? No   Functional Status Survey:    Vitals:   09/03/19 1531  BP: 140/87  Pulse: 71  Resp: 17  Temp: 98.7 F (37.1 C)  TempSrc: Oral  Weight: 131 lb 4.8 oz (59.6 kg)  Height: 5\' 2"  (1.575 m)   Body mass index is 24.02 kg/m. Physical Exam   In general this is a pleasant elderly female no distress.  Her skin is warm and dry she does not have any evidence of new bruising or bleeding.  Eyes visual acuity appears to be intact sclera and conjunctive are clear.  Oropharynx is clear mucous membranes moist.  Chest is clear to auscultation there is no labored breathing.  Heart is regular rate and rhythm without murmur gallop or rub she has her mild baseline lower extremity edema bilaterally.  Abdomen soft nontender with positive bowel sounds.  Musculoskeletal is able to move all extremities x4 it appears at baseline again limited exam since she is in bed but appears to be at baseline.  Neurologic is grossly intact her speech is clear could not really appreciate lateralizing findings.  Psych she is pleasant and appropriate  Labs reviewed: Recent Labs    12/14/18 0536 12/15/18 0911 12/16/18 0611 12/17/18 0417 12/27/18 06/12/19  NA 138 138 136 135 142 141  K 3.9 3.4* 4.0 4.1 3.6 4.3  CL 110 108 108 105  --   --   CO2 22 23 22 22   --   --   GLUCOSE 144* 85 96 104*  --   --   BUN 25* 25* 21 20 32* 25*  CREATININE 1.13* 0.81 0.86 0.99 0.9 0.8  CALCIUM 8.3* 7.8* 7.8* 7.7*  --   --   MG 2.1 2.3 2.2  --   --   --    Recent Labs    06/12/19  AST 14  ALT 12  ALKPHOS 49   Recent Labs    12/16/18 0611 12/17/18 0417 12/18/18 0408 12/27/18 06/12/19  WBC 7.4 7.3 8.2 12.2 7.0  NEUTROABS  --  4.7 5.3  --  3  HGB 7.3* 9.4* 9.5* 9.0* 11.1*  HCT 24.5* 31.3* 31.4* 28* 35*  MCV 83.3 83.2 83.3  --   --   PLT 282 265 257 404* 279   Lab Results  Component Value Date   TSH 1.71 08/01/2018   Lab Results  Component Value Date    HGBA1C 5.7 08/01/2018   Lab Results  Component Value Date   CHOL 140 08/01/2018   HDL 46 08/01/2018   LDLCALC 80 08/01/2018   TRIG 82 08/01/2018   CHOLHDL 2.4 03/03/2012    Significant Diagnostic Results in last 30 days:  No results found.  Assessment/Plan #1 anticoagulation with history of VTE and pulmonary embolism-INR is somewhat supratherapeutic at 3.2 today it appears to be trending up on 2 mg a day-will hold it tonight and recheck an INR tomorrow. Clinically she appears to be stable without evidence of increased bruising or bleeding.  2.  Hypertension she is on Cozaar 50 mg a day Norvasc 10 mg a day-per review of blood pressures she has some variability-- systolic is [id number] today I see previous readings in the 150s at times but I also see readings in the 120s over the past few days-average appears to be right around [id number] systolically however occasionally there have been systolic readings under [id number] which would raise concern for hypotension-at this point will monitor since clinically she appears to be stable and would be hesitant to be real aggressive secondary to risk of hypotension and advanced age but this will have to be watched  #3  History of coronary artery disease she continues on aspirin 81 mg a day as well as atorvastatin 10 mg a day-at this point appears to be stable she does not complain of chest pain--nursing staff has not reported any recent acute issues  [id number]--of note greater than 25 minutes spent assessing patient-reviewing her chart and labs-coordinating and formulating plan of care-of note greater than 50% of time spent coordinating a plan of care with input as noted above        Granville Lewis, PA-C [phone]

## 2019-09-04 ENCOUNTER — Non-Acute Institutional Stay (SKILLED_NURSING_FACILITY): Payer: Medicare Other | Admitting: Internal Medicine

## 2019-09-04 ENCOUNTER — Encounter: Payer: Self-pay | Admitting: Internal Medicine

## 2019-09-04 DIAGNOSIS — I2510 Atherosclerotic heart disease of native coronary artery without angina pectoris: Secondary | ICD-10-CM | POA: Diagnosis not present

## 2019-09-04 DIAGNOSIS — I825Y9 Chronic embolism and thrombosis of unspecified deep veins of unspecified proximal lower extremity: Secondary | ICD-10-CM

## 2019-09-04 DIAGNOSIS — S72142D Displaced intertrochanteric fracture of left femur, subsequent encounter for closed fracture with routine healing: Secondary | ICD-10-CM | POA: Diagnosis not present

## 2019-09-04 DIAGNOSIS — R2681 Unsteadiness on feet: Secondary | ICD-10-CM | POA: Diagnosis not present

## 2019-09-04 DIAGNOSIS — Z7901 Long term (current) use of anticoagulants: Secondary | ICD-10-CM | POA: Diagnosis not present

## 2019-09-04 DIAGNOSIS — M6281 Muscle weakness (generalized): Secondary | ICD-10-CM | POA: Diagnosis not present

## 2019-09-04 DIAGNOSIS — E785 Hyperlipidemia, unspecified: Secondary | ICD-10-CM | POA: Diagnosis not present

## 2019-09-05 DIAGNOSIS — E785 Hyperlipidemia, unspecified: Secondary | ICD-10-CM | POA: Diagnosis not present

## 2019-09-05 DIAGNOSIS — I2510 Atherosclerotic heart disease of native coronary artery without angina pectoris: Secondary | ICD-10-CM | POA: Diagnosis not present

## 2019-09-05 DIAGNOSIS — S72142D Displaced intertrochanteric fracture of left femur, subsequent encounter for closed fracture with routine healing: Secondary | ICD-10-CM | POA: Diagnosis not present

## 2019-09-05 DIAGNOSIS — M6281 Muscle weakness (generalized): Secondary | ICD-10-CM | POA: Diagnosis not present

## 2019-09-05 DIAGNOSIS — R2681 Unsteadiness on feet: Secondary | ICD-10-CM | POA: Diagnosis not present

## 2019-09-06 DIAGNOSIS — E785 Hyperlipidemia, unspecified: Secondary | ICD-10-CM | POA: Diagnosis not present

## 2019-09-06 DIAGNOSIS — R2681 Unsteadiness on feet: Secondary | ICD-10-CM | POA: Diagnosis not present

## 2019-09-06 DIAGNOSIS — S72142D Displaced intertrochanteric fracture of left femur, subsequent encounter for closed fracture with routine healing: Secondary | ICD-10-CM | POA: Diagnosis not present

## 2019-09-06 DIAGNOSIS — I2510 Atherosclerotic heart disease of native coronary artery without angina pectoris: Secondary | ICD-10-CM | POA: Diagnosis not present

## 2019-09-06 DIAGNOSIS — M6281 Muscle weakness (generalized): Secondary | ICD-10-CM | POA: Diagnosis not present

## 2019-09-07 DIAGNOSIS — I2510 Atherosclerotic heart disease of native coronary artery without angina pectoris: Secondary | ICD-10-CM | POA: Diagnosis not present

## 2019-09-07 DIAGNOSIS — E785 Hyperlipidemia, unspecified: Secondary | ICD-10-CM | POA: Diagnosis not present

## 2019-09-07 DIAGNOSIS — M6281 Muscle weakness (generalized): Secondary | ICD-10-CM | POA: Diagnosis not present

## 2019-09-07 DIAGNOSIS — R2681 Unsteadiness on feet: Secondary | ICD-10-CM | POA: Diagnosis not present

## 2019-09-07 DIAGNOSIS — S72142D Displaced intertrochanteric fracture of left femur, subsequent encounter for closed fracture with routine healing: Secondary | ICD-10-CM | POA: Diagnosis not present

## 2019-09-09 ENCOUNTER — Encounter: Payer: Self-pay | Admitting: Internal Medicine

## 2019-09-09 NOTE — Progress Notes (Signed)
Location:   [facility] of Service:   SNF  [name] Duos, MD  Patient Care Team: [name] Duos, MD as PCP - General (Internal Medicine)  Extended Emergency Contact Information Primary Emergency Contact: [name],[name] Address: [address]          [place], [place] [address] [name] Phone: [phone] Mobile Phone: [phone] Relation: Son Secondary Emergency Contact: Elgin of [place] Phone: [phone] Relation: Daughter    Allergies: Ambien [zolpidem tartrate], Codeine, Codeine phosphate, Darvocet [propoxyphene n-acetaminophen], Fish allergy, Fish-derived products, Hydrocodone, Promethazine, Promethazine hcl, Sulfamethoxazole, Vicodin [hydrocodone-acetaminophen], Amoxicillin, and Penicillins  Chief Complaint  Patient presents with  . Acute Visit    HPI: Patient is 85 y.o. female who is being seen for INR check.  Yesterday patient's INR was 3.2 on 2 mg daily and it was held and today her INR is 2.3.  Past Medical History:  Diagnosis Date  . Anemia 03/02/2012  . Angina pectoris, unstable ([facility]) 03/02/2012  . Anxiety   . Axillary abscess   . Bradycardia 06/13/2012  . CAD (coronary artery disease)    S/p PTCA / stenting (last cath 2004, multiple LAD stents, 2 stents in the right coronary artery all patent)  . Cancer ([facility])   . Complication of anesthesia   . Compression fracture of L2 ([facility]) 03/12/2017  . Dementia ([facility])   . Dysphagia 10/06/2012  . Dyspnea 02/03/2012  . GERD (gastroesophageal reflux disease)   . History of pulmonary embolus (PE) 03/19/2017  . HTN (hypertension)   . PMR (polymyalgia rheumatica) (HCC)   . PONV (postoperative nausea and vomiting)   . Restless leg syndrome   . 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 Started coumadin  . Weakness of both legs 06/09/2012    Past Surgical History:  Procedure Laterality Date  . BREAST LUMPECTOMY    . COLECTOMY    .  ESOPHAGOGASTRODUODENOSCOPY (EGD) WITH ESOPHAGEAL DILATION  10/10/2012   Procedure: ESOPHAGOGASTRODUODENOSCOPY (EGD) WITH ESOPHAGEAL DILATION;  Surgeon: Inda Castle, MD;  Location: [facility];  Service: Endoscopy;  Laterality: N/A;  . FEMUR IM NAIL Left 12/13/2018   Procedure: INTRAMEDULLARY (IM) NAIL FEMORAL LEFT;  Surgeon: Paralee Cancel, MD;  [facility];  Service: Orthopedics;  Laterality: Left;  . heart stents  x 8  . INCISION AND DRAINAGE     bilateral axillary, non specific staff  . INCISION AND DRAINAGE ABSCESS  09/28/2012   Procedure: INCISION AND DRAINAGE ABSCESS;  Surgeon: Zenovia Jarred, MD;  Location: [facility];  Service: General;  Laterality: Bilateral;  . stent     cardiac x 8 stents.    Allergies as of 09/04/2019      Reactions   Ambien [zolpidem Tartrate] Nausea And Vomiting   Codeine    Codeine Phosphate Nausea And Vomiting   Darvocet [propoxyphene N-acetaminophen] Nausea And Vomiting   Fish Allergy    Fish-derived Products    Hydrocodone    Promethazine    Promethazine Hcl Nausea And Vomiting   Sulfamethoxazole Other (See Comments)   Pt doesn't remember reaction   Vicodin [hydrocodone-acetaminophen] Other (See Comments)   Unknown reaction   Amoxicillin Nausea And Vomiting, Rash   Penicillins Nausea And Vomiting, Rash      Medication List       Accurate as of September 04, 2019 11:59 PM. If you have any questions, ask your nurse or doctor.        acetaminophen  325 MG tablet Commonly known as: TYLENOL Take 650 mg by mouth every 6 (six) hours as needed for mild pain, moderate pain or fever.   acetaminophen 325 MG tablet Commonly known as: TYLENOL Take 650 mg by mouth every 6 (six) hours. Take routinely for left hip pain   ALPRAZolam 0.25 MG tablet Commonly known as: XANAX Take 0.25 mg by mouth daily.   amLODipine 10 MG tablet Commonly known as: NORVASC Take 10 mg by mouth every morning.   aspirin EC 81 MG tablet Take 1 tablet (81 mg total) by  mouth daily.   atorvastatin 10 MG tablet Commonly known as: LIPITOR Take 10 mg by mouth every evening.   donepezil 10 MG tablet Commonly known as: ARICEPT Take 10 mg by mouth every evening.   losartan 50 MG tablet Commonly known as: COZAAR Take 50 mg by mouth at bedtime.   nitroGLYCERIN 0.4 MG SL tablet Commonly known as: NITROSTAT Place 0.4 mg under the tongue every 5 (five) minutes as needed for chest pain.   NON FORMULARY Diet Type:  Liberalized due to poor appetite to Mech soft   NUTRITIONAL SUPPLEMENT PO Take 1 each by mouth 2 (two) times a day. Magic Cup - take with lunch and dinner   ondansetron 4 MG tablet Commonly known as: ZOFRAN Take 4 mg by mouth every 4 (four) hours as needed for nausea.   oxybutynin 5 MG tablet Commonly known as: DITROPAN Take 5 mg by mouth 2 (two) times daily.   polyethylene glycol 17 g packet Commonly known as: MIRALAX / GLYCOLAX Take 17 g by mouth daily as needed for moderate constipation.   predniSONE 1 MG tablet Commonly known as: DELTASONE Take 1 mg by mouth daily with breakfast.   ProAir HFA 108 (90 Base) MCG/ACT inhaler Generic drug: albuterol Inhale 2 puffs into the lungs every 6 (six) hours as needed for wheezing or shortness of breath.   rOPINIRole 1 MG tablet Commonly known as: REQUIP Take 1 mg by mouth See admin instructions. 1mg  at noon, 1mg  2 hours before bed, then 1mg  at bedtime   senna-docusate 8.6-50 MG tablet Commonly known as: Senokot-S Take 1 tablet by mouth 2 (two) times daily.   Silenor 6 MG Tabs Generic drug: Doxepin HCl Take 0.5 tablets (3 mg total) by mouth at bedtime.   traMADol 50 MG tablet Commonly known as: ULTRAM Take 1 tablet (50 mg total) by mouth 2 (two) times daily.   Tums 500 MG chewable tablet Generic drug: calcium carbonate Chew 1 tablet by mouth 3 (three) times daily as needed for indigestion or heartburn.   Vitamin D3 1.25 MG (50000 UT) Caps Take 1 capsule by mouth once a week.        No orders of the defined types were placed in this encounter.   Immunization History  Administered Date(s) Administered  . Influenza-Unspecified 08/11/2017, 07/02/2018, 08/02/2019  . PPD Test 03/18/2017  . Pneumococcal Conjugate-13 09/17/2017  . Pneumococcal Polysaccharide-23 04/17/2019    Social History   Tobacco Use  . Smoking status: Never Smoker  . Smokeless tobacco: Never Used  Substance Use Topics  . Alcohol use: No     Vitals:   09/09/19 2000  BP: 112/72  Pulse: (!) 59  Resp: 18  Temp: (!) 97.2 F (36.2 C)   There is no height or weight on file to calculate BMI.   Patient Active Problem List   Diagnosis Date Noted  . Failure to thrive in adult 01/08/2019  .  Moderate protein malnutrition ([facility]) 01/08/2019  . Pressure injury of skin 12/18/2018  . Intertrochanteric fracture of left femur ([place] [place]) 12/11/2018  . CKD (chronic kidney disease), stage III 12/11/2018  . Depression with anxiety 12/11/2018  . Overactive bladder 09/24/2017  . Insomnia 07/31/2017  . Anxiety 06/16/2017  . Citrobacter infection 03/19/2017  . AKI (acute kidney injury) ([facility]) 03/19/2017  . History of pulmonary embolus (PE) 03/19/2017  . Elevated INR 03/19/2017  . Candidal intertrigo 03/19/2017  . Compression fracture of L2 ([facility]) 03/12/2017  . Acute lower UTI 03/12/2017  . Accelerated hypertension 03/12/2017  . Leukocytosis 07/31/2015  . Acute bronchitis 07/28/2015  . Chronic anticoagulation 07/28/2015  . Intractable back pain 07/20/2015  . Back pain 07/20/2015  . Cellulitis and abscess of left buttock 04/12/2013  . Stricture and stenosis of esophagus 10/10/2012  . Dementia ([facility]) 10/06/2012  . Dysphagia 10/06/2012  . Axillary abscess - bilateral, multiple 09/27/2012  . Hypotension 08/24/2012  . Near syncope 08/24/2012  . Abscess of axilla, left 06/27/2012  . Bradycardia 06/13/2012  . Abscess 06/11/2012  . Chest pain 06/09/2012  . Cellulitis 06/09/2012  . Weakness of both legs  06/09/2012  . PMR (polymyalgia rheumatica) (HCC)   . Restless leg syndrome   . Angina pectoris, unstable ([facility]) 03/02/2012  . Acute blood loss as cause of postoperative anemia 03/02/2012  . Dyspnea 02/03/2012  . UTI (urinary tract infection) 02/03/2012  . CAD (coronary artery disease)   . HTN (hypertension)   . VTE (venous thromboembolism) 10/01/2010  . HYPERCHOLESTEROLEMIA  IIA 08/27/2009  . Hyperlipidemia 12/11/2008  . HYPERTENSION, BENIGN 12/11/2008    CMP     Component Value Date/Time   NA 141 06/12/2019   K 4.3 06/12/2019   CL 105 12/17/2018 0417   CO2 22 12/17/2018 0417   GLUCOSE 104 (H) 12/17/2018 0417   BUN 25 (A) 06/12/2019   CREATININE 0.8 06/12/2019   CREATININE 0.99 12/17/2018 0417   CALCIUM 7.7 (L) 12/17/2018 0417   PROT 6.7 03/11/2017 2031   ALBUMIN 3.4 (L) 03/11/2017 2031   AST 14 06/12/2019   ALT 12 06/12/2019   ALKPHOS 49 06/12/2019   BILITOT 0.5 03/11/2017 2031   GFRNONAA 48 (L) 12/17/2018 0417   GFRAA 56 (L) 12/17/2018 0417   Recent Labs    12/14/18 0536 12/15/18 0911 12/16/18 0611 12/17/18 0417 12/27/18 06/12/19  NA 138 138 136 135 142 141  K 3.9 3.4* 4.0 4.1 3.6 4.3  CL 110 108 108 105  --   --   CO2 22 23 22 22   --   --   GLUCOSE 144* 85 96 104*  --   --   BUN 25* 25* 21 20 32* 25*  CREATININE 1.13* 0.81 0.86 0.99 0.9 0.8  CALCIUM 8.3* 7.8* 7.8* 7.7*  --   --   MG 2.1 2.3 2.2  --   --   --    Recent Labs    06/12/19  AST 14  ALT 12  ALKPHOS 49   Recent Labs    12/16/18 0611 12/17/18 0417 12/18/18 0408 12/27/18 06/12/19  WBC 7.4 7.3 8.2 12.2 7.0  NEUTROABS  --  4.7 5.3  --  3  HGB 7.3* 9.4* 9.5* 9.0* 11.1*  HCT 24.5* 31.3* 31.4* 28* 35*  MCV 83.3 83.2 83.3  --   --   PLT 282 265 257 404* 279   No results for input(s): CHOL, LDLCALC, TRIG in the last 8760 hours.  Invalid input(s): HCL No results found  for: [facility] Lab Results  Component Value Date   TSH 1.71 08/01/2018   Lab Results  Component Value Date   HGBA1C  5.7 08/01/2018   Lab Results  Component Value Date   CHOL 140 08/01/2018   HDL 46 08/01/2018   LDLCALC 80 08/01/2018   TRIG 82 08/01/2018   CHOLHDL 2.4 03/03/2012    Significant Diagnostic Results in last 30 days:  No results found.  Assessment and Plan  History of PE/history DVT/chronic anticoagulation-yesterday patient's INR was 3.2 on 2 mg of Coumadin daily.  Today INR is 2.3.  We will start Coumadin 1.5 mg on Monday and 2 mg every other day.  Recheck INR 1 week    [name] [name] , MD

## 2019-09-10 DIAGNOSIS — E559 Vitamin D deficiency, unspecified: Secondary | ICD-10-CM | POA: Diagnosis not present

## 2019-09-10 DIAGNOSIS — I10 Essential (primary) hypertension: Secondary | ICD-10-CM | POA: Diagnosis not present

## 2019-09-10 DIAGNOSIS — M6281 Muscle weakness (generalized): Secondary | ICD-10-CM | POA: Diagnosis not present

## 2019-09-10 DIAGNOSIS — E785 Hyperlipidemia, unspecified: Secondary | ICD-10-CM | POA: Diagnosis not present

## 2019-09-10 DIAGNOSIS — I2510 Atherosclerotic heart disease of native coronary artery without angina pectoris: Secondary | ICD-10-CM | POA: Diagnosis not present

## 2019-09-10 DIAGNOSIS — R2681 Unsteadiness on feet: Secondary | ICD-10-CM | POA: Diagnosis not present

## 2019-09-10 DIAGNOSIS — S72142D Displaced intertrochanteric fracture of left femur, subsequent encounter for closed fracture with routine healing: Secondary | ICD-10-CM | POA: Diagnosis not present

## 2019-09-10 LAB — VITAMIN D 25 HYDROXY (VIT D DEFICIENCY, FRACTURES): Vit D, 25-Hydroxy: 26.37

## 2019-09-12 DIAGNOSIS — E785 Hyperlipidemia, unspecified: Secondary | ICD-10-CM | POA: Diagnosis not present

## 2019-09-12 DIAGNOSIS — M6281 Muscle weakness (generalized): Secondary | ICD-10-CM | POA: Diagnosis not present

## 2019-09-12 DIAGNOSIS — R2681 Unsteadiness on feet: Secondary | ICD-10-CM | POA: Diagnosis not present

## 2019-09-12 DIAGNOSIS — I2510 Atherosclerotic heart disease of native coronary artery without angina pectoris: Secondary | ICD-10-CM | POA: Diagnosis not present

## 2019-09-12 DIAGNOSIS — S72142D Displaced intertrochanteric fracture of left femur, subsequent encounter for closed fracture with routine healing: Secondary | ICD-10-CM | POA: Diagnosis not present

## 2019-09-13 DIAGNOSIS — R2681 Unsteadiness on feet: Secondary | ICD-10-CM | POA: Diagnosis not present

## 2019-09-13 DIAGNOSIS — S72142D Displaced intertrochanteric fracture of left femur, subsequent encounter for closed fracture with routine healing: Secondary | ICD-10-CM | POA: Diagnosis not present

## 2019-09-13 DIAGNOSIS — I2510 Atherosclerotic heart disease of native coronary artery without angina pectoris: Secondary | ICD-10-CM | POA: Diagnosis not present

## 2019-09-13 DIAGNOSIS — M6281 Muscle weakness (generalized): Secondary | ICD-10-CM | POA: Diagnosis not present

## 2019-09-13 DIAGNOSIS — E785 Hyperlipidemia, unspecified: Secondary | ICD-10-CM | POA: Diagnosis not present

## 2019-09-14 DIAGNOSIS — R2681 Unsteadiness on feet: Secondary | ICD-10-CM | POA: Diagnosis not present

## 2019-09-14 DIAGNOSIS — S72142D Displaced intertrochanteric fracture of left femur, subsequent encounter for closed fracture with routine healing: Secondary | ICD-10-CM | POA: Diagnosis not present

## 2019-09-14 DIAGNOSIS — E785 Hyperlipidemia, unspecified: Secondary | ICD-10-CM | POA: Diagnosis not present

## 2019-09-14 DIAGNOSIS — I2510 Atherosclerotic heart disease of native coronary artery without angina pectoris: Secondary | ICD-10-CM | POA: Diagnosis not present

## 2019-09-14 DIAGNOSIS — M6281 Muscle weakness (generalized): Secondary | ICD-10-CM | POA: Diagnosis not present

## 2019-09-17 DIAGNOSIS — R2681 Unsteadiness on feet: Secondary | ICD-10-CM | POA: Diagnosis not present

## 2019-09-17 DIAGNOSIS — E785 Hyperlipidemia, unspecified: Secondary | ICD-10-CM | POA: Diagnosis not present

## 2019-09-17 DIAGNOSIS — I2510 Atherosclerotic heart disease of native coronary artery without angina pectoris: Secondary | ICD-10-CM | POA: Diagnosis not present

## 2019-09-17 DIAGNOSIS — M6281 Muscle weakness (generalized): Secondary | ICD-10-CM | POA: Diagnosis not present

## 2019-09-17 DIAGNOSIS — S72142D Displaced intertrochanteric fracture of left femur, subsequent encounter for closed fracture with routine healing: Secondary | ICD-10-CM | POA: Diagnosis not present

## 2019-09-19 ENCOUNTER — Non-Acute Institutional Stay (SKILLED_NURSING_FACILITY): Payer: Medicare Other | Admitting: Internal Medicine

## 2019-09-19 ENCOUNTER — Encounter: Payer: Self-pay | Admitting: Internal Medicine

## 2019-09-19 DIAGNOSIS — Z7901 Long term (current) use of anticoagulants: Secondary | ICD-10-CM

## 2019-09-19 DIAGNOSIS — F0280 Dementia in other diseases classified elsewhere without behavioral disturbance: Secondary | ICD-10-CM

## 2019-09-19 DIAGNOSIS — Z5181 Encounter for therapeutic drug level monitoring: Secondary | ICD-10-CM

## 2019-09-19 DIAGNOSIS — I10 Essential (primary) hypertension: Secondary | ICD-10-CM | POA: Diagnosis not present

## 2019-09-19 DIAGNOSIS — I2510 Atherosclerotic heart disease of native coronary artery without angina pectoris: Secondary | ICD-10-CM | POA: Diagnosis not present

## 2019-09-19 DIAGNOSIS — G301 Alzheimer's disease with late onset: Secondary | ICD-10-CM | POA: Diagnosis not present

## 2019-09-19 DIAGNOSIS — Z86711 Personal history of pulmonary embolism: Secondary | ICD-10-CM

## 2019-09-19 DIAGNOSIS — S72145D Nondisplaced intertrochanteric fracture of left femur, subsequent encounter for closed fracture with routine healing: Secondary | ICD-10-CM

## 2019-09-19 NOTE — Progress Notes (Signed)
This is a routine/acute visit.  Level of care skilled.  [facility]  Chief complaint routine visit for medical management of chronic medical conditions including dementia-hypertension-coronary artery disease-hyperlipidemia-overactive bladder-polymyalgia rheumatica-vitamin D deficiency  Also anticoagulation management patient on Coumadin secondary to history of pulmonary embolism DVT.  History of present illness.  Patient is a pleasant 85 year old female with the above diagnoses-she is on chronic Coumadin with a history of DVT and pulmonary embolism in the past.  INR today is elevated at 3.5 last week was 2.6.  She is on 2 mg of Coumadin.  All days except Mondays when she gets 1.5 mg  Of note she has just finished a course of antibiotics for what was thought to be a dental infection and this was prescribed by the dentist.  She has just completed clindamycin apparently had been on amoxicillin before that.  She does not exhibit any increased bruising or bleeding  Currently she is lying in bed comfortably she is bright alert working on her coloring books which is what she usually does when I see her.  She does not really have any complaints.  Vital signs appear to be stable her weight appears to be relatively stable at 131 pounds.  Regards to her other issues she does have a history of hypertension and has variable blood pressures she is on Cozaar 50 mg a day and Norvasc 10 mg a day blood pressure today is 143/75 AC other readings with systolics in the 0000000 but I also see 1 in the high [id number] systolically.  Appears to be a lot of variability here.  In regards to coronary artery disease she has been essentially asymptomatic without complaints of chest pain or shortness of breath she is on aspirin 81 mg a day as well as nitro as needed.  In regards to dementia she is on Aricept and appears to do well with this there have been no behaviors noted she continues to  be pleasant-occupies herself with coloring books often it appears-her weight is relatively stable at 131 pounds.  Regards to her overactive bladder she is on Ditropan 5 mg twice daily and this appears to be helping.  She also is on low-dose prednisone milligram a day with a history of polymyalgia rheumatica.  I do note she has a listed history of vitamin D deficiency vitamin D level earlier this month was slightly low at 26.37 however it was 34 on previous lab-she gets 50,000 units a week 1 would be suspicious possibly she is missed a dose or 2-will have this updated in several weeks to see where it is trending.  Past Medical History:  Diagnosis Date  . Anemia 03/02/2012  . Angina pectoris, unstable ([place]) 03/02/2012  . Anxiety   . Axillary abscess   . Bradycardia 06/13/2012  . CAD (coronary artery disease)    S/p PTCA / stenting (last cath 2004, multiple LAD stents, 2 stents in the right coronary artery all patent)  . Cancer ([place])   . Complication of anesthesia   . Compression fracture of L2 ([place]) 03/12/2017  . Dementia ([place])   . Dysphagia 10/06/2012  . Dyspnea 02/03/2012  . GERD (gastroesophageal reflux disease)   . History of pulmonary embolus (PE) 03/19/2017  . HTN (hypertension)   . PMR (polymyalgia rheumatica) (HCC)   . PONV (postoperative nausea and vomiting)   . Restless leg syndrome   . 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 Started coumadin  . Weakness of  both legs 06/09/2012        Past Surgical History:  Procedure Laterality Date  . BREAST LUMPECTOMY    . COLECTOMY    . ESOPHAGOGASTRODUODENOSCOPY (EGD) WITH ESOPHAGEAL DILATION  10/10/2012   Procedure: ESOPHAGOGASTRODUODENOSCOPY (EGD) WITH ESOPHAGEAL DILATION; Surgeon: Inda Castle, MD; Location: [facility]; Service: Endoscopy; Laterality: N/A;  . FEMUR IM NAIL Left 12/13/2018   Procedure: INTRAMEDULLARY (IM) NAIL FEMORAL LEFT; Surgeon: Paralee Cancel, MD; Location: [facility]; Service: Orthopedics; Laterality: Left;   . heart stents  x 8  . INCISION AND DRAINAGE     bilateral axillary, non specific staff  . INCISION AND DRAINAGE ABSCESS  09/28/2012   Procedure: INCISION AND DRAINAGE ABSCESS; Surgeon: Zenovia Jarred, MD; Location: [facility]; Service: General; Laterality: Bilateral;  . stent     cardiac x 8 stents.        Allergies as of 09/04/2019      Reactions   Ambien [zolpidem Tartrate] Nausea And Vomiting   Codeine    Codeine Phosphate Nausea And Vomiting   Darvocet [propoxyphene N-acetaminophen] Nausea And Vomiting   Fish Allergy    Fish-derived Products    Hydrocodone    Promethazine    Promethazine Hcl Nausea And Vomiting   Sulfamethoxazole Other (See Comments)   Pt doesn't remember reaction   Vicodin [hydrocodone-acetaminophen] Other (See Comments)   Unknown reaction   Amoxicillin Nausea And Vomiting, Rash   Penicillins Nausea And Vomiting, Rash           Medication List                acetaminophen 325 MG tablet  Commonly known as: TYLENOL  Take 650 mg by mouth every 6 (six) hours as needed for mild pain, moderate pain or fever.   acetaminophen 325 MG tablet  Commonly known as: TYLENOL  Take 650 mg by mouth every 6 (six) hours. Take routinely for left hip pain   ALPRAZolam 0.25 MG tablet  Commonly known as: XANAX  Take 0.25 mg by mouth daily.   amLODipine 10 MG tablet  Commonly known as: NORVASC  Take 10 mg by mouth every morning.   aspirin EC 81 MG tablet  Take 1 tablet (81 mg total) by mouth daily.   atorvastatin 10 MG tablet  Commonly known as: LIPITOR  Take 10 mg by mouth every evening.   donepezil 10 MG tablet  Commonly known as: ARICEPT  Take 10 mg by mouth every evening.   losartan 50 MG tablet  Commonly known as: COZAAR  Take 50 mg by mouth at bedtime.   nitroGLYCERIN 0.4 MG SL tablet  Commonly known as: NITROSTAT  Place 0.4 mg under the tongue every 5 (five) minutes as needed for chest pain.   NON FORMULARY  Diet Type: Liberalized due to poor  appetite to Mech soft   NUTRITIONAL SUPPLEMENT PO  Take 1 each by mouth 2 (two) times a day. Magic Cup - take with lunch and dinner   ondansetron 4 MG tablet  Commonly known as: ZOFRAN  Take 4 mg by mouth every 4 (four) hours as needed for nausea.   oxybutynin 5 MG tablet  Commonly known as: DITROPAN  Take 5 mg by mouth 2 (two) times daily.   polyethylene glycol 17 g packet  Commonly known as: MIRALAX / GLYCOLAX  Take 17 g by mouth daily as needed for moderate constipation.   predniSONE 1 MG tablet  Commonly known as: DELTASONE  Take 1 mg by mouth daily  with breakfast.   ProAir HFA 108 (90 Base) MCG/ACT inhaler  Generic drug: albuterol  Inhale 2 puffs into the lungs every 6 (six) hours as needed for wheezing or shortness of breath.   rOPINIRole 1 MG tablet  Commonly known as: REQUIP  Take 1 mg by mouth See admin instructions. 1mg  at noon, 1mg  2 hours before bed, then 1mg  at bedtime   senna-docusate 8.6-50 MG tablet  Commonly known as: Senokot-S  Take 1 tablet by mouth 2 (two) times daily.   Silenor 6 MG Tabs  Generic drug: Doxepin HCl  Take 0.5 tablets (3 mg total) by mouth at bedtime.   traMADol 50 MG tablet  Commonly known as: ULTRAM  Take 1 tablet (50 mg total) by mouth 2 (two) times daily.   Tums 500 MG chewable tablet  Generic drug: calcium carbonate  Chew 1 tablet by mouth 3 (three) times daily as needed for indigestion or heartburn.   Vitamin D3 1.25 MG (50000 UT) Caps  Take 1 capsule by mouth once a week.      No orders of the defined types were placed in this encounter.      Immunization History  Administered Date(s) Administered  . Influenza-Unspecified 08/11/2017, 07/02/2018, 08/02/2019  . PPD Test 03/18/2017  . Pneumococcal Conjugate-13 09/17/2017  . Pneumococcal Polysaccharide-23 04/17/2019   Social History       Tobacco Use  . Smoking status: Never Smoker  . Smokeless tobacco: Never Used  Substance Use Topics  . Alcohol use: No             Patient Active Problem List   Diagnosis Date Noted  . Failure to thrive in adult 01/08/2019  . Moderate protein malnutrition ([place]) 01/08/2019  . Pressure injury of skin 12/18/2018  . Intertrochanteric fracture of left femur ([place]) 12/11/2018  . CKD (chronic kidney disease), stage III 12/11/2018  . Depression with anxiety 12/11/2018  . Overactive bladder 09/24/2017  . Insomnia 07/31/2017  . Anxiety 06/16/2017  . Citrobacter infection 03/19/2017  . AKI (acute kidney injury) ([place]) 03/19/2017  . History of pulmonary embolus (PE) 03/19/2017  . Elevated INR 03/19/2017  . [place] intertrigo 03/19/2017  . Compression fracture of L2 ([place]) 03/12/2017  . Acute lower UTI 03/12/2017  . Accelerated hypertension 03/12/2017  . Leukocytosis 07/31/2015  . Acute bronchitis 07/28/2015  . Chronic anticoagulation 07/28/2015  . Intractable back pain 07/20/2015  . Back pain 07/20/2015  . Cellulitis and abscess of left buttock 04/12/2013  . Stricture and stenosis of esophagus 10/10/2012  . Dementia ([place]) 10/06/2012  . Dysphagia 10/06/2012  . Axillary abscess - bilateral, multiple 09/27/2012  . Hypotension 08/24/2012  . Near syncope 08/24/2012  . Abscess of axilla, left 06/27/2012  . Bradycardia 06/13/2012  . Abscess 06/11/2012  . Chest pain 06/09/2012  . Cellulitis 06/09/2012  . Weakness of both legs 06/09/2012  . PMR (polymyalgia rheumatica) (HCC)   . Restless leg syndrome   . Angina pectoris, unstable ([place]) 03/02/2012  . Acute blood loss as cause of postoperative anemia 03/02/2012  . Dyspnea 02/03/2012  . UTI (urinary tract infection) 02/03/2012  . CAD (coronary artery disease)   . HTN (hypertension)   . VTE (venous thromboembolism) 10/01/2010  . HYPERCHOLESTEROLEMIA IIA 08/27/2009  . Hyperlipidemia 12/11/2008  . HYPERTENSION, BENIGN 12/11/2008   .  Review of systems.  This is somewhat limited secondary to dementia.  General she is not complaining of any fever or chills does not  really have any complaints.  Skin does not  complain of rashes or itching or increased bruising.  Head ears eyes nose mouth and throat not complaining of any sore throat or visual changes.  Respiratory denies shortness of breath or cough.  Cardiac denies chest pain or increased edema.  GI is not complaining of abdominal pain nausea vomiting diarrhea or constipation.  GU is not complaining of dysuria.  Musculoskeletal denies joint pain or pain at this time in general.  Neurologic is not complaining of being dizzy or having a headache or feeling syncopal.  And psych appears to be in good spirits is not complaining of being depressed or anxious.  Physical exam.  Temperature is 97.3 pulse of 53 respirations 18 blood pressure 143/75.  In general this is a pleasant elderly female in no distress lying in bed coloring in her book.  Her skin is warm and dry do not note increased bruising from baseline.  Eyes visual acuity appears to be intact sclera and conjunctive are clear.  Oropharynx is clear mucous membranes moist.  Chest is clear to auscultation there is no labored breathing.  Heart is slightly bradycardic without murmur gallop or rub she has baseline mild lower extremity edema.  Abdomen is soft nontender with positive bowel sounds.  Musculoskeletal Limited exam since she is in bed but is able to move all extremities x4 it appears at baseline.  Neurologic is grossly intact could not appreciate lateralizing findings her speech is clear cranial nerves intact.  Psych she is pleasant and appropriate oriented to self follows simple verbal commands without any difficulty.  Labs.  September 19, 2019.  INR-3.5.   September 10, 2019.  Vitamin D level 26.37.  June 12, 2019.  Sodium 141 potassium 4.3 BUN 24.8 creatinine 0.82.  Liver function tests within normal limits.  WBC 7.0 hemoglobin 11.1 platelets 279.   Assessment and plan.  1.  Anticoagulation management on  chronic Coumadin with history of DVT-pulmonary embolism in the past-INR supratherapeutic today at 3.5-no evidence of increased bruising or bleeding-Will hold her Coumadin tonight and update an INR tomorrow.  2.  History of hypertension as noted above she has variable blood pressures with systolics at times in the 140s and other times significantly lower than that at this point will monitor considering her advanced age will be fairly conservative-would like to avoid hypotension if at all possible.-Continues on Norvasc 10 mg a day and Cozaar 50 mg a day  3.  History of coronary artery disease she continues on anticoagulation she is on aspirin 81 mg a day as well as a statin most recent LDL was 80-she also has nitro as needed-this is been relatively asymptomatic without complaints of chest pain or shortness of breath.  4.  History of dementia she appears to be doing well with supportive care her weight is relatively stable she is on Aricept 10 mg a day no behaviors have been noted she is always very pleasant when I visit with her and seems to enjoy coloring in her book.  5.  History of overactive bladder she is on Ditropan 5 mg twice daily and this appears to be stable.  6.  History of polymyalgia rheumatica this appears to be fairly well controlled on her prednisone 1 mg a day.  7.-History of vitamin D deficiency-vitamin D level slightly low at 26.37 previously had been at 34-will update this in few weeks-she does get 50,000 units q. weekly-possibly she may have missed a dose or 2 which may account for the lower level but would like to  have this rechecked.  8.  History of anxiety this appears to be controlled on Xanax 0.25 mg once a day.  9.  History of left femoral fracture status post repair in February 2020 -she appears to be stable in this regard she does have tramadol for pain twice a day-she is largely nonambulatory.  10.-History of restless legs she continues on Requip twice daily 1 mg she  does not really complain of any leg pain or discomfort or difficulty sleeping during my conversation with her today.  11.  History of dental infection this has been followed by dentistry she is completed a course of clindamycin she is not complaining of any mouth discomfort nursing does not report any issues in this regards anymore at this point will monitor.  12.  Bradycardia at times she will have pulses it appears in the 50s this is not unusual and well-tolerated at this point will continue to monitor   Of note will update a CBC and metabolic panel she does have some history of chronic kidney disease this appears to have been stable with a creatinine of 0.8 BUN of 24.8 back in August but this will warrant updating.    [id number] note greater than 35 minutes spent assessing patient reviewing her chart and labs and coordinating and formulating plan of care-of note greater than 50% of time spent coordinating a plan of care for numerous diagnoses

## 2019-09-20 ENCOUNTER — Non-Acute Institutional Stay (SKILLED_NURSING_FACILITY): Payer: Medicare Other | Admitting: Internal Medicine

## 2019-09-20 DIAGNOSIS — D649 Anemia, unspecified: Secondary | ICD-10-CM | POA: Diagnosis not present

## 2019-09-20 DIAGNOSIS — I8290 Acute embolism and thrombosis of unspecified vein: Secondary | ICD-10-CM

## 2019-09-20 DIAGNOSIS — Z5181 Encounter for therapeutic drug level monitoring: Secondary | ICD-10-CM

## 2019-09-20 DIAGNOSIS — Z7901 Long term (current) use of anticoagulants: Secondary | ICD-10-CM

## 2019-09-20 DIAGNOSIS — Z9189 Other specified personal risk factors, not elsewhere classified: Secondary | ICD-10-CM | POA: Diagnosis not present

## 2019-09-20 DIAGNOSIS — I10 Essential (primary) hypertension: Secondary | ICD-10-CM

## 2019-09-20 LAB — BASIC METABOLIC PANEL
BUN: 22 — AB (ref 4–21)
CO2: 21 (ref 13–22)
Chloride: 106 (ref 99–108)
Creatinine: 0.9 (ref 0.5–1.1)
Glucose: 78
Potassium: 4.8 (ref 3.4–5.3)
Sodium: 137 (ref 137–147)

## 2019-09-20 LAB — CBC: RBC: 3.83 — AB (ref 3.87–5.11)

## 2019-09-20 LAB — CBC AND DIFFERENTIAL
HCT: 34 — AB (ref 36–46)
Hemoglobin: 10.9 — AB (ref 12.0–16.0)
Neutrophils Absolute: 3
Platelets: 303 (ref 150–399)
WBC: 7

## 2019-09-20 LAB — COMPREHENSIVE METABOLIC PANEL
Calcium: 8.5 — AB (ref 8.7–10.7)
GFR calc Af Amer: 65.3
GFR calc non Af Amer: 56.34

## 2019-09-20 NOTE — Progress Notes (Signed)
This is an acute visit.  Level of care is skilled.  [facility]ility [facility]  Chief complaint acute visit to follow-up on anticoagulation management as well as anemia.  History of present illness.  Patient is a pleasant 85 year old female who was seen yesterday for routine visit for medical management as well as a supratherapeutic INR of 3.5.  Her Coumadin was held she was on 2 mg previously every day except 1.5 mg on Mondays.    she is on Coumadin with a previous history of DVT and pulmonary embolism We did do a follow-up INR today and it is back down to 2.3.  There has been no increased bruising or bleeding.  We also did updated labs which show stability she does have a history of anemia but hemoglobin is stable at 10.9.  Currently she is lying in bed comfortably which is her usual status vital signs appear stable blood pressures a bit elevated today at 146/74 she does have variable systolics but I do not see consistent elevations we are trying to keep an eye on this.  She has no complaints today.  Past Medical History:  Diagnosis Date  . Anemia 03/02/2012  . Angina pectoris, unstable ([place]) 03/02/2012  . Anxiety   . Axillary abscess   . Bradycardia 06/13/2012  . CAD (coronary artery disease)    S/p PTCA / stenting (last cath 2004, multiple LAD stents, 2 stents in the right coronary artery all patent)  . Cancer ([place])   . Complication of anesthesia   . Compression fracture of L2 ([place]) 03/12/2017  . Dementia ([place])   . Dysphagia 10/06/2012  . Dyspnea 02/03/2012  . GERD (gastroesophageal reflux disease)   . History of pulmonary embolus (PE) 03/19/2017  . HTN (hypertension)   . PMR (polymyalgia rheumatica) (HCC)   . PONV (postoperative nausea and vomiting)   . Restless leg syndrome   . 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 Started coumadin  . Weakness of both legs 06/09/2012        Past Surgical History:  Procedure Laterality Date  . BREAST  LUMPECTOMY    . COLECTOMY    . ESOPHAGOGASTRODUODENOSCOPY (EGD) WITH ESOPHAGEAL DILATION  10/10/2012   Procedure: ESOPHAGOGASTRODUODENOSCOPY (EGD) WITH ESOPHAGEAL DILATION; Surgeon: Inda Castle, MD; Location: [facility]; Service: Endoscopy; Laterality: N/A;  . FEMUR IM NAIL Left 12/13/2018   Procedure: INTRAMEDULLARY (IM) NAIL FEMORAL LEFT; Surgeon: Paralee Cancel, MD; [facility]; Service: Orthopedics; Laterality: Left;  . heart stents  x 8  . INCISION AND DRAINAGE     bilateral axillary, non specific staff  . INCISION AND DRAINAGE ABSCESS  09/28/2012   Procedure: INCISION AND DRAINAGE ABSCESS; Surgeon: Zenovia Jarred, MD; Location: [facility]; Service: General; Laterality: Bilateral;  . stent     cardiac x 8 stents.        Allergies as of 09/04/2019      Reactions   Ambien [zolpidem Tartrate] Nausea And Vomiting   Codeine    Codeine Phosphate Nausea And Vomiting   Darvocet [propoxyphene N-acetaminophen] Nausea And Vomiting   Fish Allergy    Fish-derived Products    Hydrocodone    Promethazine    Promethazine Hcl Nausea And Vomiting   Sulfamethoxazole Other (See Comments)   Pt doesn't remember reaction   Vicodin [hydrocodone-acetaminophen] Other (See Comments)   Unknown reaction   Amoxicillin Nausea And Vomiting, Rash   Penicillins Nausea And Vomiting, Rash  Medication List                acetaminophen 325 MG tablet  Commonly known as: TYLENOL  Take 650 mg by mouth every 6 (six) hours as needed for mild pain, moderate pain or fever.   acetaminophen 325 MG tablet  Commonly known as: TYLENOL  Take 650 mg by mouth every 6 (six) hours. Take routinely for left hip pain   ALPRAZolam 0.25 MG tablet  Commonly known as: XANAX  Take 0.25 mg by mouth daily.   amLODipine 10 MG tablet  Commonly known as: NORVASC  Take 10 mg by mouth every morning.   aspirin EC 81 MG tablet  Take 1 tablet (81 mg  total) by mouth daily.   atorvastatin 10 MG tablet  Commonly known as: LIPITOR  Take 10 mg by mouth every evening.   donepezil 10 MG tablet  Commonly known as: ARICEPT  Take 10 mg by mouth every evening.   losartan 50 MG tablet  Commonly known as: COZAAR  Take 50 mg by mouth at bedtime.   nitroGLYCERIN 0.4 MG SL tablet  Commonly known as: NITROSTAT  Place 0.4 mg under the tongue every 5 (five) minutes as needed for chest pain.   NON FORMULARY  Diet Type: Liberalized due to poor appetite to Mech soft   NUTRITIONAL SUPPLEMENT PO  Take 1 each by mouth 2 (two) times a day. Magic Cup - take with lunch and dinner   ondansetron 4 MG tablet  Commonly known as: ZOFRAN  Take 4 mg by mouth every 4 (four) hours as needed for nausea.   oxybutynin 5 MG tablet  Commonly known as: DITROPAN  Take 5 mg by mouth 2 (two) times daily.   polyethylene glycol 17 g packet  Commonly known as: MIRALAX / GLYCOLAX  Take 17 g by mouth daily as needed for moderate constipation.   predniSONE 1 MG tablet  Commonly known as: DELTASONE  Take 1 mg by mouth daily with breakfast.   ProAir HFA 108 (90 Base) MCG/ACT inhaler  Generic drug: albuterol  Inhale 2 puffs into the lungs every 6 (six) hours as needed for wheezing or shortness of breath.   rOPINIRole 1 MG tablet  Commonly known as: REQUIP  Take 1 mg by mouth See admin instructions. 1mg  at noon, 1mg  2 hours before bed, then 1mg  at bedtime   senna-docusate 8.6-50 MG tablet  Commonly known as: Senokot-S  Take 1 tablet by mouth 2 (two) times daily.   Silenor 6 MG Tabs  Generic drug: Doxepin HCl  Take 0.5 tablets (3 mg total) by mouth at bedtime.   traMADol 50 MG tablet  Commonly known as: ULTRAM  Take 1 tablet (50 mg total) by mouth 2 (two) times daily.   Tums 500 MG chewable tablet  Generic drug: calcium carbonate  Chew 1 tablet by mouth 3 (three) times daily as needed for indigestion or heartburn.   Vitamin D3 1.25 MG (50000 UT) Caps  Take 1 capsule by  mouth once a week.      No orders of the defined types were placed in this encounter.      Immunization History  Administered Date(s) Administered  . Influenza-Unspecified 08/11/2017, 07/02/2018, 08/02/2019  . PPD Test 03/18/2017  . Pneumococcal Conjugate-13 09/17/2017  . Pneumococcal Polysaccharide-23 04/17/2019   Social History       Tobacco Use  . Smoking status: Never Smoker  . Smokeless tobacco: Never Used  Substance Use Topics  . Alcohol  use: No             Review of systems.  This is somewhat limited secondary to dementia.  General no complaints of fever or chills.  Skin no increased bruising noted does not complain of bleeding.  Head ears eyes nose mouth and throat is not complaining of any visual changes or eye pain nasal discharge or sore throat.  Respiratory is not complaining of being short of breath or having a cough.  Cardiac no complaints of chest pain or increased edema from baseline.  GI is not complaining of abdominal discomfort nausea vomiting diarrhea constipation.  GU no complaints of dysuria.  Musculoskeletal is not complaining of joint pain today.  Neurologic does not complain of dizziness headache syncope dizziness or increased weakness from baseline.  And psych no complaints of being sad or depressed or anxious.   Physical exam  Temperature is 97.0 pulse 52 respirations 17 blood pressure 146/74.  In general this is a pleasant elderly female in no distress lying comfortably in her bed.  Her skin is warm and dry do not note any increased bruising or bleeding.  Eyes sclera and conjunctive are clear visual acuity appears to be intact.  Oropharynx is clear mucous membranes moist.  Chest is largely clear to auscultation-she had a slight amount of expiratory wheezing upper left which cleared l with cough-also a very minimal amountnotedf expiratory at the bases bilaterally-no evidence of any labored breathing  Heart  is bradycardic in the 50s without murmur gallop or rub she has mild lower extremity edema.  Abdomen is soft nontender with positive bowel sounds.  Musculoskeletal continues to move all extremities x4 with a limited exam since she is in bed.  Neurologic is grossly intact her speech is clear cannot appreciate lateralizing findings.  Psych she is oriented to self she is pleasant appropriate answers simple questions appropriately.  Labs.  September 20, 2019.Marland Kitchen  INR-2.3.  Of note INR yesterday was 3.5-September 19, 2019  WBC 7.0 hemoglobin 10.9 platelets 303.  June 12, 2019.  Sodium 141 potassium 4.3 BUN 24.8 creatinine 0.82.     Liver function tests within normal limits.  Assessment and plan.  Anticoagulation management with history of DVT and pulmonary embolism-INR is back in therapeutic range-we will decrease her Coumadin to 1.5 mg Tuesday Thursday Saturday and Sunday and continue 2 mg Monday Wednesday and Friday-.  We will have this updated on Monday, November 23..  2.  Anemia this shows stability with a hemoglobin of 10.9 on today's lab.  3.  History of occasional slight wheezing--has orders for albuterol inhaler every 6 hours as needed as well as ProAir-this appears to be stable-- at times she will have  slight wheezing  but this does not usually progress continue to monitor  4.  Hypertension she continues to have somewhat variable systolics but as noted in previous note there is not consistent elevation over 140 and we are somewhat conservative with her history of advanced age and comorbidities-she is on Norvasc 10 mg a day and Cozaar 50 mg a day   [id number]

## 2019-09-21 ENCOUNTER — Encounter: Payer: Self-pay | Admitting: Internal Medicine

## 2019-09-24 ENCOUNTER — Non-Acute Institutional Stay (SKILLED_NURSING_FACILITY): Payer: Medicare Other | Admitting: Internal Medicine

## 2019-09-24 ENCOUNTER — Encounter: Payer: Self-pay | Admitting: Internal Medicine

## 2019-09-24 DIAGNOSIS — Z86711 Personal history of pulmonary embolism: Secondary | ICD-10-CM

## 2019-09-24 DIAGNOSIS — I8290 Acute embolism and thrombosis of unspecified vein: Secondary | ICD-10-CM

## 2019-09-24 DIAGNOSIS — Z7901 Long term (current) use of anticoagulants: Secondary | ICD-10-CM | POA: Diagnosis not present

## 2019-09-24 NOTE — Progress Notes (Signed)
Location:  [place] Room Number: 204-D Place of Service:  SNF [phone]) Provider:  Henreitta Leber, MD  Patient Care Team: Hennie Duos, MD as PCP - General (Internal Medicine)  Extended Emergency Contact Information Primary Emergency Contact: Mcshan,[name] Address: [place], [place] [address] [name] of [place] Phone: [phone] Mobile Phone: [phone] Relation: Son Secondary Emergency Contact: [place] of [place] Phone: [phone] Relation: Daughter  Code Status:  DNR Goals of care: Advanced Directive information Advanced Directives 09/03/2019  Does Patient Have a Medical Advance Directive? Yes  Type of Advance Directive Out of facility DNR (pink MOST or yellow form)  Does patient want to make changes to medical advance directive? No - Patient declined  Copy of [place] in Chart? -  Pre-existing out of facility DNR order (yellow form or pink MOST form) Yellow form placed in chart (order not valid for inpatient use)     Chief Complaint  Patient presents with  . Acute Visit    Anticoagulation management  With history of DVT and pulmonary embolism.    HPI:  Pt is a 85 y.o. female seen today for an acute visit for follow-up of anticoagulation management-she is on Coumadin with a history of DVT and pulmonary embolism.  Last week her INR was somewhat high at 3.5 and we held it for day and it did come down to 2.3.  She previously was on 2 mg every day except 1.5 mg on Mondays and we did reduce this somewhat to 1.5 mg Saturday Sunday Tuesday and Thursday and 2 mg on Monday Wednesday and Friday.  INR today is slightly low at 1.8.  There has been no reports of increased bruising or bleeding as usual patient is resting in bed comfortably she has no complaints and vital signs appear to be stable.     Past Medical History:  Diagnosis Date  . Anemia  03/02/2012  . Angina pectoris, unstable ([place]) 03/02/2012  . Anxiety   . Axillary abscess   . Bradycardia 06/13/2012  . CAD (coronary artery disease)    S/p PTCA / stenting (last cath 2004, multiple LAD stents, 2 stents in the right coronary artery all patent)  . Cancer ([place])   . Complication of anesthesia   . Compression fracture of L2 ([place]) 03/12/2017  . Dementia ([place])   . Dysphagia 10/06/2012  . Dyspnea 02/03/2012  . GERD (gastroesophageal reflux disease)   . History of pulmonary embolus (PE) 03/19/2017  . HTN (hypertension)   . PMR (polymyalgia rheumatica) (HCC)   . PONV (postoperative nausea and vomiting)   . Restless leg syndrome   . 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 Started coumadin  . Weakness of both legs 06/09/2012   Past Surgical History:  Procedure Laterality Date  . BREAST LUMPECTOMY    . COLECTOMY    . ESOPHAGOGASTRODUODENOSCOPY (EGD) WITH ESOPHAGEAL DILATION  10/10/2012   Procedure: ESOPHAGOGASTRODUODENOSCOPY (EGD) WITH ESOPHAGEAL DILATION;  Surgeon: Inda Castle, MD;  Location: [facility];  Service: Endoscopy;  Laterality: N/A;  . FEMUR IM NAIL Left 12/13/2018   Procedure: INTRAMEDULLARY (IM) NAIL FEMORAL LEFT;  Surgeon: Paralee Cancel, MD;  Location[facility];  Service: Orthopedics;  Laterality: Left;  . heart stents  x 8  . INCISION AND DRAINAGE     bilateral axillary, non specific staff  . INCISION AND DRAINAGE ABSCESS  09/28/2012   Procedure: INCISION AND DRAINAGE ABSCESS;  Surgeon: Zenovia Jarred, MD;  Location: [facility];  Service: General;  Laterality: Bilateral;  . stent     cardiac x 8 stents.    Allergies  Allergen Reactions  . Ambien [Zolpidem Tartrate] Nausea And Vomiting  . Codeine   . Codeine Phosphate Nausea And Vomiting  . Darvocet [Propoxyphene N-Acetaminophen] Nausea And Vomiting  . Fish Allergy   . Fish-Derived Products   . Hydrocodone   . Promethazine   . Promethazine Hcl Nausea And Vomiting  . Sulfamethoxazole Other (See  Comments)    Pt doesn't remember reaction  . Vicodin [Hydrocodone-Acetaminophen] Other (See Comments)    Unknown reaction  . Amoxicillin Nausea And Vomiting and Rash  . Penicillins Nausea And Vomiting and Rash    Outpatient Encounter Medications as of 09/24/2019  Medication Sig  . acetaminophen (TYLENOL) 325 MG tablet Take 650 mg by mouth every 6 (six) hours as needed for mild pain, moderate pain or fever.  Marland Kitchen acetaminophen (TYLENOL) 325 MG tablet Take 650 mg by mouth every 6 (six) hours. Take routinely for left hip pain  . albuterol (PROAIR HFA) 108 (90 Base) MCG/ACT inhaler Inhale 2 puffs into the lungs every 6 (six) hours as needed for wheezing or shortness of breath.  . ALPRAZolam (XANAX) 0.25 MG tablet Take 0.25 mg by mouth daily.  Marland Kitchen amLODipine (NORVASC) 10 MG tablet Take 10 mg by mouth every morning.   Marland Kitchen aspirin EC 81 MG tablet Take 1 tablet (81 mg total) by mouth daily.  Marland Kitchen atorvastatin (LIPITOR) 10 MG tablet Take 10 mg by mouth every evening.  . calcium carbonate (TUMS) 500 MG chewable tablet Chew 1 tablet by mouth 3 (three) times daily as needed for indigestion or heartburn.  . Cholecalciferol (VITAMIN D3) 1.25 MG (50000 UT) CAPS Take 1 capsule by mouth once a week.  . donepezil (ARICEPT) 10 MG tablet Take 10 mg by mouth every evening.   Marland Kitchen losartan (COZAAR) 50 MG tablet Take 50 mg by mouth at bedtime.   . nitroGLYCERIN (NITROSTAT) 0.4 MG SL tablet Place 0.4 mg under the tongue every 5 (five) minutes as needed for chest pain.  . NON FORMULARY Diet Type:  Liberalized due to poor appetite to Mech soft  . Nutritional Supplements (NUTRITIONAL SUPPLEMENT PO) Take 1 each by mouth 2 (two) times a day. Magic Cup - take with lunch and dinner  . ondansetron (ZOFRAN) 4 MG tablet Take 4 mg by mouth every 4 (four) hours as needed for nausea.   Marland Kitchen oxybutynin (DITROPAN) 5 MG tablet Take 5 mg by mouth 2 (two) times daily.   . polyethylene glycol (MIRALAX / GLYCOLAX) packet Take 17 g by mouth daily as  needed for moderate constipation.  . predniSONE (DELTASONE) 1 MG tablet Take 1 mg by mouth daily with breakfast.   . rOPINIRole (REQUIP) 1 MG tablet Take 1 mg by mouth See admin instructions. 1mg  at noon, 1mg  2 hours before bed, then 1mg  at bedtime  . senna-docusate (SENOKOT-S) 8.6-50 MG tablet Take 1 tablet by mouth 2 (two) times daily.  Marland Kitchen SILENOR 6 MG TABS Take 0.5 tablets (3 mg total) by mouth at bedtime.  . traMADol (ULTRAM) 50 MG tablet Take 1 tablet (50 mg total) by mouth 2 (two) times daily.  Marland Kitchen warfarin (COUMADIN) 2 MG tablet Take 2 mg by mouth every Monday, Wednesday, and Friday.  . warfarin (COUMADIN) 3 MG tablet Take 1.5 mg by mouth See admin instructions.  Tue-Thu-Sat-Sun   No facility-administered encounter medications on file as of 09/24/2019.     Review of Systems   This is limited secondary to dementia.  General she is not complaining of any fever or chills.  Skin no complaints of increased rashes bruising or bleeding.  Head ears eyes nose mouth and throat is not complain of visual changes sore throat or dental discomfort she did recently complete a course of clindamycin for dental infection.  Respiratory she is not complaining of shortness of breath or cough.  Cardiac does not complain of chest pain or increased edema from baseline.  GI no complaints of abdominal discomfort nausea vomiting diarrhea constipation.  Musculoskeletal no complaints of joint pain.  Neurologic does not complain of any headaches or feeling dizzy or syncopal or weak.  Psych does not give any statements indicating depression or being sad.    Immunization History  Administered Date(s) Administered  . Influenza-Unspecified 08/11/2017, 07/02/2018, 08/02/2019  . PPD Test 03/18/2017  . Pneumococcal Conjugate-13 09/17/2017  . Pneumococcal Polysaccharide-23 04/17/2019   Pertinent  Health Maintenance Due  Topic Date Due  . INFLUENZA VACCINE  Completed  . DEXA SCAN  Completed  . PNA vac Low  Risk Adult  Completed   Fall Risk  03/14/2018  Falls in the past year? No   Functional Status Survey:    Vitals:   09/24/19 1435  BP: 124/83  Pulse: 68  Resp: 18  Temp: (!) 97.1 F (36.2 C)  TempSrc: Oral  Weight: 131 lb 9.6 oz (59.7 kg)  Height: 5\' 1"  (1.549 m)   Body mass index is 24.87 kg/m. Physical Exam  In general this is a pleasant elderly female in no distress lying comfortably in bed.  Her skin is warm and dry I do not note any new bruising or bleeding.   Chest is clear to auscultation somewhat shallow air entry there is no congestion noted today no labored breathing.  Heart is regular rate and rhythm in the 60s today she has mild lower extremity edema appears to be baseline.  Abdomen is soft nontender with positive bowel sounds.  Musculoskeletal Limited exam since she is in bed but is able to move all extremities x4 it appears at baseline.  Neurologic is grossly intact her speech is clear.  Could not appreciate lateralizing findings.  Psych she is oriented to self she is pleasant appropriate labs.  I labs.  September 24, 2019.  INR 1.8.  September 20, 2019.  INR 2.3.  September 19, 2019-INR was 3.5.  September 19, 2019.  WBC 7.6 hemoglobin 10.9 platelets 303  Sodium 137 potassium 4.8 BUN 22.0 creatinine 0.87  .   Labs reviewed: Recent Labs    12/14/18 0536 12/15/18 0911 12/16/18 0611 12/17/18 0417 12/27/18 06/12/19  NA 138 138 136 135 142 141  K 3.9 3.4* 4.0 4.1 3.6 4.3  CL 110 108 108 105  --   --   CO2 22 23 22 22   --   --   GLUCOSE 144* 85 96 104*  --   --   BUN 25* 25* 21 20 32* 25*  CREATININE 1.13* 0.81 0.86 0.99 0.9 0.8  CALCIUM 8.3* 7.8* 7.8* 7.7*  --   --   MG 2.1 2.3 2.2  --   --   --    Recent Labs    06/12/19  AST 14  ALT 12  ALKPHOS 49   Recent Labs    12/16/18 0611 12/17/18 0417 12/18/18 0408 12/27/18 06/12/19  WBC 7.4 7.3 8.2 12.2 7.0  NEUTROABS  --  4.7 5.3  --  3  HGB 7.3* 9.4* 9.5* 9.0* 11.1*  HCT  24.5* 31.3* 31.4* 28* 35*  MCV 83.3 83.2 83.3  --   --   PLT 282 265 257 404* 279   Lab Results  Component Value Date   TSH 1.71 08/01/2018   Lab Results  Component Value Date   HGBA1C 5.7 08/01/2018   Lab Results  Component Value Date   CHOL 140 08/01/2018   HDL 46 08/01/2018   LDLCALC 80 08/01/2018   TRIG 82 08/01/2018   CHOLHDL 2.4 03/03/2012    Significant Diagnostic Results in last 30 days:  No results found.  Assessment/Plan  #1 anticoagulation management with history of DVT and pulmonary embolism-INR is slightly low-will increase Coumadin to 2 mg a day on Monday Tuesdays Thursdays and Saturdays-1.5 mg on Wednesdays Fridays and Sundays.  We will update an INR in 1 week.  [place], [place]

## 2019-09-25 ENCOUNTER — Encounter: Payer: Self-pay | Admitting: Internal Medicine

## 2019-09-25 ENCOUNTER — Non-Acute Institutional Stay (SKILLED_NURSING_FACILITY): Payer: Medicare Other | Admitting: Internal Medicine

## 2019-09-25 DIAGNOSIS — Z298 Encounter for other specified prophylactic measures: Secondary | ICD-10-CM

## 2019-09-25 DIAGNOSIS — I10 Essential (primary) hypertension: Secondary | ICD-10-CM

## 2019-09-25 DIAGNOSIS — Z789 Other specified health status: Secondary | ICD-10-CM | POA: Diagnosis not present

## 2019-09-25 NOTE — Progress Notes (Signed)
Location:  [facility] Room Number: 204-D Place of Service:  SNF (31)  Hennie Duos, MD  Patient Care Team: Hennie Duos, MD as PCP - General (Internal Medicine)  Extended Emergency Contact Information Primary Emergency Contact: Stribling,[name] Address: [address]          [place], [place] [address] [name] of [place] Phone: [phone] Mobile Phone: [phone] Relation: Son Secondary Emergency Contact: [place] of [place] Phone: [phone] Relation: Daughter    Allergies: Ambien [zolpidem tartrate], Codeine, Codeine phosphate, Darvocet [propoxyphene n-acetaminophen], Fish allergy, Fish-derived products, Hydrocodone, Promethazine, Promethazine hcl, Sulfamethoxazole, Vicodin [hydrocodone-acetaminophen], Amoxicillin, and Penicillins  Chief Complaint  Patient presents with  . Acute Visit    Patient is seen for COVID prophylaxis    HPI: Patient is a 85 y.o. female who is being seen for viral prophylaxis.  Everyone in the facility is being put on vitamin C, zinc, and vitamin D in an effort to prophylax against Covid.  Past Medical History:  Diagnosis Date  . Anemia 03/02/2012  . Angina pectoris, unstable ([place]) 03/02/2012  . Anxiety   . Axillary abscess   . Bradycardia 06/13/2012  . CAD (coronary artery disease)    S/p PTCA / stenting (last cath 2004, multiple LAD stents, 2 stents in the right coronary artery all patent)  . Cancer ([place])   . Complication of anesthesia   . Compression fracture of L2 ([place]) 03/12/2017  . Dementia ([place])   . Dysphagia 10/06/2012  . Dyspnea 02/03/2012  . GERD (gastroesophageal reflux disease)   . History of pulmonary embolus (PE) 03/19/2017  . HTN (hypertension)   . PMR (polymyalgia rheumatica) (HCC)   . PONV (postoperative nausea and vomiting)   . Restless leg syndrome   . 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 Started coumadin  . Weakness of both  legs 06/09/2012    Past Surgical History:  Procedure Laterality Date  . BREAST LUMPECTOMY    . COLECTOMY    . ESOPHAGOGASTRODUODENOSCOPY (EGD) WITH ESOPHAGEAL DILATION  10/10/2012   Procedure: ESOPHAGOGASTRODUODENOSCOPY (EGD) WITH ESOPHAGEAL DILATION;  Surgeon: Inda Castle, MD;  Location: [facility];  Service: Endoscopy;  Laterality: N/A;  . FEMUR IM NAIL Left 12/13/2018   Procedure: INTRAMEDULLARY (IM) NAIL FEMORAL LEFT;  Surgeon: Paralee Cancel, MD;  [facility];  Service: Orthopedics;  Laterality: Left;  . heart stents  x 8  . INCISION AND DRAINAGE     bilateral axillary, non specific staff  . INCISION AND DRAINAGE ABSCESS  09/28/2012   Procedure: INCISION AND DRAINAGE ABSCESS;  Surgeon: Zenovia Jarred, MD;  Location: [facility];  Service: General;  Laterality: Bilateral;  . stent     cardiac x 8 stents.    Allergies as of 09/25/2019      Reactions   Ambien [zolpidem Tartrate] Nausea And Vomiting   Codeine    Codeine Phosphate Nausea And Vomiting   Darvocet [propoxyphene N-acetaminophen] Nausea And Vomiting   Fish Allergy    Fish-derived Products    Hydrocodone    Promethazine    Promethazine Hcl Nausea And Vomiting   Sulfamethoxazole Other (See Comments)   Pt doesn't remember reaction   Vicodin [hydrocodone-acetaminophen] Other (See Comments)   Unknown reaction   Amoxicillin Nausea And Vomiting, Rash   Penicillins Nausea And Vomiting, Rash      Medication List       Accurate as of September 25, 2019  1:16 PM.  If you have any questions, ask your nurse or doctor.        acetaminophen 325 MG tablet Commonly known as: TYLENOL Take 650 mg by mouth every 6 (six) hours as needed for mild pain, moderate pain or fever.   acetaminophen 325 MG tablet Commonly known as: TYLENOL Take 650 mg by mouth every 6 (six) hours. Take routinely for left hip pain   ALPRAZolam 0.25 MG tablet Commonly known as: XANAX Take 0.25 mg by mouth daily.   amLODipine 10 MG tablet  Commonly known as: NORVASC Take 10 mg by mouth every morning.   aspirin EC 81 MG tablet Take 1 tablet (81 mg total) by mouth daily.   atorvastatin 10 MG tablet Commonly known as: LIPITOR Take 10 mg by mouth every evening.   donepezil 10 MG tablet Commonly known as: ARICEPT Take 10 mg by mouth every evening.   losartan 50 MG tablet Commonly known as: COZAAR Take 50 mg by mouth at bedtime.   nitroGLYCERIN 0.4 MG SL tablet Commonly known as: NITROSTAT Place 0.4 mg under the tongue every 5 (five) minutes as needed for chest pain.   NON FORMULARY Diet Type:  Liberalized due to poor appetite to Mech soft   NUTRITIONAL SUPPLEMENT PO Take 1 each by mouth 2 (two) times a day. Magic Cup - take with lunch and dinner   ondansetron 4 MG tablet Commonly known as: ZOFRAN Take 4 mg by mouth every 4 (four) hours as needed for nausea.   oxybutynin 5 MG tablet Commonly known as: DITROPAN Take 5 mg by mouth 2 (two) times daily.   polyethylene glycol 17 g packet Commonly known as: MIRALAX / GLYCOLAX Take 17 g by mouth daily as needed for moderate constipation.   predniSONE 1 MG tablet Commonly known as: DELTASONE Take 1 mg by mouth daily with breakfast.   ProAir HFA 108 (90 Base) MCG/ACT inhaler Generic drug: albuterol Inhale 2 puffs into the lungs every 6 (six) hours as needed for wheezing or shortness of breath.   rOPINIRole 1 MG tablet Commonly known as: REQUIP Take 1 mg by mouth See admin instructions. 1mg  at noon, 1mg  2 hours before bed, then 1mg  at bedtime   senna-docusate 8.6-50 MG tablet Commonly known as: Senokot-S Take 1 tablet by mouth 2 (two) times daily.   Silenor 6 MG Tabs Generic drug: Doxepin HCl Take 0.5 tablets (3 mg total) by mouth at bedtime.   traMADol 50 MG tablet Commonly known as: ULTRAM Take 1 tablet (50 mg total) by mouth 2 (two) times daily.   Tums 500 MG chewable tablet Generic drug: calcium carbonate Chew 1 tablet by mouth 3 (three) times  daily as needed for indigestion or heartburn.   Vitamin D3 1.25 MG (50000 UT) Caps Take 1 capsule by mouth once a week.   warfarin 2 MG tablet Commonly known as: COUMADIN Take 2 mg by mouth every Monday, Wednesday, and Friday.   warfarin 3 MG tablet Commonly known as: COUMADIN Take 1.5 mg by mouth See admin instructions. Tue-Thu-Sat-Sun       No orders of the defined types were placed in this encounter.   Immunization History  Administered Date(s) Administered  . Influenza-Unspecified 08/11/2017, 07/02/2018, 08/02/2019  . PPD Test 03/18/2017  . Pneumococcal Conjugate-13 09/17/2017  . Pneumococcal Polysaccharide-23 04/17/2019    Social History   Tobacco Use  . Smoking status: Never Smoker  . Smokeless tobacco: Never Used  Substance Use Topics  . Alcohol use: No  Review of Systems  GENERAL:  no fevers, fatigue, appetite changes SKIN: No itching, rash HEENT: No complaint RESPIRATORY: No cough, wheezing, SOB CARDIAC: No chest pain, palpitations, lower extremity edema  GI: No abdominal pain, No N/V/D or constipation, No heartburn or reflux  GU: No dysuria, frequency or urgency, or incontinence  MUSCULOSKELETAL: No unrelieved bone/joint pain NEUROLOGIC: No headache, dizziness  PSYCHIATRIC: No overt anxiety or sadness  Vitals:   09/25/19 1314  BP: (!) 142/66  Pulse: (!) 58  Resp: 18  Temp: (!) 97 F (36.1 C)  SpO2: 97%   Body mass index is 24.87 kg/m. Physical Exam  GENERAL APPEARANCE: Alert, conversant, No acute distress  SKIN: No diaphoresis rash HEENT: Unremarkable RESPIRATORY: Breathing is even, unlabored. Lung sounds are clear   CARDIOVASCULAR: Heart RRR no murmurs, rubs or gallops. No peripheral edema  GASTROINTESTINAL: Abdomen is soft, non-tender, not distended w/ normal bowel sounds.  GENITOURINARY: Bladder non tender, not distended  MUSCULOSKELETAL: No abnormal joints or musculature NEUROLOGIC: Cranial nerves 2-12 grossly intact. Moves all  extremities PSYCHIATRIC: Mood and affect with dementia, no behavioral issues  Patient Active Problem List   Diagnosis Date Noted  . Failure to thrive in adult 01/08/2019  . Moderate protein malnutrition ([place]) 01/08/2019  . Pressure injury of skin 12/18/2018  . Intertrochanteric fracture of left femur ([place]) 12/11/2018  . CKD (chronic kidney disease), stage III 12/11/2018  . Depression with anxiety 12/11/2018  . Overactive bladder 09/24/2017  . Insomnia 07/31/2017  . Anxiety 06/16/2017  . Citrobacter infection 03/19/2017  . AKI (acute kidney injury) ([facility]) 03/19/2017  . History of pulmonary embolus (PE) 03/19/2017  . Elevated INR 03/19/2017  . Candidal intertrigo 03/19/2017  . Compression fracture of L2 ([place]) 03/12/2017  . Acute lower UTI 03/12/2017  . Accelerated hypertension 03/12/2017  . Leukocytosis 07/31/2015  . Acute bronchitis 07/28/2015  . Chronic anticoagulation 07/28/2015  . Intractable back pain 07/20/2015  . Back pain 07/20/2015  . Cellulitis and abscess of left buttock 04/12/2013  . Stricture and stenosis of esophagus 10/10/2012  . Dementia ([facility]) 10/06/2012  . Dysphagia 10/06/2012  . Axillary abscess - bilateral, multiple 09/27/2012  . Hypotension 08/24/2012  . Near syncope 08/24/2012  . Abscess of axilla, left 06/27/2012  . Bradycardia 06/13/2012  . Abscess 06/11/2012  . Chest pain 06/09/2012  . Cellulitis 06/09/2012  . Weakness of both legs 06/09/2012  . PMR (polymyalgia rheumatica) (HCC)   . Restless leg syndrome   . Angina pectoris, unstable ([place]) 03/02/2012  . Acute blood loss as cause of postoperative anemia 03/02/2012  . Dyspnea 02/03/2012  . UTI (urinary tract infection) 02/03/2012  . CAD (coronary artery disease)   . HTN (hypertension)   . VTE (venous thromboembolism) 10/01/2010  . HYPERCHOLESTEROLEMIA  IIA 08/27/2009  . Hyperlipidemia 12/11/2008  . HYPERTENSION, BENIGN 12/11/2008    CMP     Component Value Date/Time   NA 141 06/12/2019   K  4.3 06/12/2019   CL 105 12/17/2018 0417   CO2 22 12/17/2018 0417   GLUCOSE 104 (H) 12/17/2018 0417   BUN 25 (A) 06/12/2019   CREATININE 0.8 06/12/2019   CREATININE 0.99 12/17/2018 0417   CALCIUM 7.7 (L) 12/17/2018 0417   PROT 6.7 03/11/2017 2031   ALBUMIN 3.4 (L) 03/11/2017 2031   AST 14 06/12/2019   ALT 12 06/12/2019   ALKPHOS 49 06/12/2019   BILITOT 0.5 03/11/2017 2031   GFRNONAA 48 (L) 12/17/2018 0417   GFRAA 56 (L) 12/17/2018 0417   Recent Labs  12/14/18 0536 12/15/18 0911 12/16/18 0611 12/17/18 0417 12/27/18 06/12/19  NA 138 138 136 135 142 141  K 3.9 3.4* 4.0 4.1 3.6 4.3  CL 110 108 108 105  --   --   CO2 22 23 22 22   --   --   GLUCOSE 144* 85 96 104*  --   --   BUN 25* 25* 21 20 32* 25*  CREATININE 1.13* 0.81 0.86 0.99 0.9 0.8  CALCIUM 8.3* 7.8* 7.8* 7.7*  --   --   MG 2.1 2.3 2.2  --   --   --    Recent Labs    06/12/19  AST 14  ALT 12  ALKPHOS 49   Recent Labs    12/16/18 0611 12/17/18 0417 12/18/18 0408 12/27/18 06/12/19  WBC 7.4 7.3 8.2 12.2 7.0  NEUTROABS  --  4.7 5.3  --  3  HGB 7.3* 9.4* 9.5* 9.0* 11.1*  HCT 24.5* 31.3* 31.4* 28* 35*  MCV 83.3 83.2 83.3  --   --   PLT 282 265 257 404* 279   No results for input(s): CHOL, LDLCALC, TRIG in the last 8760 hours.  Invalid input(s): HCL No results found for: [facility] Lab Results  Component Value Date   TSH 1.71 08/01/2018   Lab Results  Component Value Date   HGBA1C 5.7 08/01/2018   Lab Results  Component Value Date   CHOL 140 08/01/2018   HDL 46 08/01/2018   LDLCALC 80 08/01/2018   TRIG 82 08/01/2018   CHOLHDL 2.4 03/03/2012    Significant Diagnostic Results in last 30 days:  No results found.  Assessment and Plan  Hypertension/need for prophylactic immunotherapy/no immunity to COVID-19-pharmacy was consulted; patient has been started on zinc sulfate 220 mg 1 p.o. every other day, vitamin C 500 mg daily and patient is already on vitamin D 50,000 units weekly and ASA 81 mg  daily.  Patient will have a vitamin D level drawn today and in 90 days    Hennie Duos, MD

## 2019-09-26 DIAGNOSIS — E559 Vitamin D deficiency, unspecified: Secondary | ICD-10-CM | POA: Diagnosis not present

## 2019-09-29 ENCOUNTER — Encounter: Payer: Self-pay | Admitting: Internal Medicine

## 2019-10-01 ENCOUNTER — Non-Acute Institutional Stay (SKILLED_NURSING_FACILITY): Payer: Medicare Other | Admitting: Internal Medicine

## 2019-10-01 DIAGNOSIS — Z7901 Long term (current) use of anticoagulants: Secondary | ICD-10-CM | POA: Diagnosis not present

## 2019-10-01 DIAGNOSIS — Z86711 Personal history of pulmonary embolism: Secondary | ICD-10-CM

## 2019-10-02 DIAGNOSIS — I739 Peripheral vascular disease, unspecified: Secondary | ICD-10-CM | POA: Diagnosis not present

## 2019-10-02 DIAGNOSIS — L602 Onychogryphosis: Secondary | ICD-10-CM | POA: Diagnosis not present

## 2019-10-02 NOTE — Progress Notes (Addendum)
Location:   [facility]:   SNF  Hennie Duos, MD  [name] [name] Care Team: Hennie Duos, MD as PCP - General (Internal Medicine)  Extended Emergency Contact Information Primary Emergency Contact: Requejo,[name] Address: [address]          [place], [place] [address] [name] of [place] Phone: [phone] Mobile Phone: [phone] Relation: Son Secondary Emergency Contact: [place] of [place] Phone: [phone] Relation: Daughter    Allergies: Ambien [zolpidem tartrate], Codeine, Codeine phosphate, Darvocet [propoxyphene n-acetaminophen], Fish allergy, Fish-derived products, Hydrocodone, Promethazine, Promethazine hcl, Sulfamethoxazole, Vicodin [hydrocodone-acetaminophen], Amoxicillin, and Penicillins  Chief Complaint  [name] [name] presents with  . Acute Visit    HPI: [name] [name] is 85 y.o. female who is being seen for INR check.  [name] [name]'s INR is 1.9.  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  Past Medical History:  Diagnosis Date  . Anemia 03/02/2012  . Angina pectoris, unstable ([place]) 03/02/2012  . Anxiety   . Axillary abscess   . Bradycardia 06/13/2012  . CAD (coronary artery disease)    S/p PTCA / stenting (last cath 2004, multiple LAD stents, 2 stents in the right coronary artery all patent)  . Cancer ([place])   . Complication of anesthesia   . Compression fracture of L2 ([place]) 03/12/2017  . Dementia ([place])   . Dysphagia 10/06/2012  . Dyspnea 02/03/2012  . GERD (gastroesophageal reflux disease)   . History of pulmonary embolus (PE) 03/19/2017  . HTN (hypertension)   . PMR (polymyalgia rheumatica) (HCC)   . PONV (postoperative nausea and vomiting)   . Restless leg syndrome   . 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 Started coumadin  . Weakness of both legs 06/09/2012    Past Surgical History:  Procedure Laterality Date  . BREAST LUMPECTOMY    . COLECTOMY    .  ESOPHAGOGASTRODUODENOSCOPY (EGD) WITH ESOPHAGEAL DILATION  10/10/2012   Procedure: ESOPHAGOGASTRODUODENOSCOPY (EGD) WITH ESOPHAGEAL DILATION;  Surgeon: Inda Castle, MD;  Location: [facility];  Service: Endoscopy;  Laterality: N/A;  . FEMUR IM NAIL Left 12/13/2018   Procedure: INTRAMEDULLARY (IM) NAIL FEMORAL LEFT;  Surgeon: Paralee Cancel, MD;  Location: [facility];  Service: Orthopedics;  Laterality: Left;  . heart stents  x 8  . INCISION AND DRAINAGE     bilateral axillary, non specific staff  . INCISION AND DRAINAGE ABSCESS  09/28/2012   Procedure: INCISION AND DRAINAGE ABSCESS;  Surgeon: Zenovia Jarred, MD;  Location: [facility];  Service: General;  Laterality: Bilateral;  . stent     cardiac x 8 stents.    Allergies as of 10/01/2019      Reactions   Ambien [zolpidem Tartrate] Nausea And Vomiting   Codeine    Codeine Phosphate Nausea And Vomiting   Darvocet [propoxyphene N-acetaminophen] Nausea And Vomiting   Fish Allergy    Fish-derived Products    Hydrocodone    Promethazine    Promethazine Hcl Nausea And Vomiting   Sulfamethoxazole Other (See Comments)   Pt doesn't remember reaction   Vicodin [hydrocodone-acetaminophen] Other (See Comments)   Unknown reaction   Amoxicillin Nausea And Vomiting, Rash   Penicillins Nausea And Vomiting, Rash      Medication List       Accurate as of October 01, 2019 11:59 PM. If you have any questions, ask your nurse or doctor.        acetaminophen 325 MG tablet  Commonly known as: TYLENOL Take 650 mg by mouth every 6 (six) hours as needed for mild pain, moderate pain or fever.   acetaminophen 325 MG tablet Commonly known as: TYLENOL Take 650 mg by mouth every 6 (six) hours. Take routinely for left hip pain   ALPRAZolam 0.25 MG tablet Commonly known as: XANAX Take 0.25 mg by mouth daily.   amLODipine 10 MG tablet Commonly known as: NORVASC Take 10 mg by mouth every morning.   aspirin EC 81 MG tablet Take 1 tablet (81 mg total)  by mouth daily.   atorvastatin 10 MG tablet Commonly known as: LIPITOR Take 10 mg by mouth every evening.   donepezil 10 MG tablet Commonly known as: ARICEPT Take 10 mg by mouth every evening.   losartan 50 MG tablet Commonly known as: COZAAR Take 50 mg by mouth at bedtime.   nitroGLYCERIN 0.4 MG SL tablet Commonly known as: NITROSTAT Place 0.4 mg under the tongue every 5 (five) minutes as needed for chest pain.   NON FORMULARY Diet Type:  Liberalized due to poor appetite to Mech soft   NUTRITIONAL SUPPLEMENT PO Take 1 each by mouth 2 (two) times a day. Magic Cup - take with lunch and dinner   ondansetron 4 MG tablet Commonly known as: ZOFRAN Take 4 mg by mouth every 4 (four) hours as needed for nausea.   oxybutynin 5 MG tablet Commonly known as: DITROPAN Take 5 mg by mouth 2 (two) times daily.   polyethylene glycol 17 g packet Commonly known as: MIRALAX / GLYCOLAX Take 17 g by mouth daily as needed for moderate constipation.   predniSONE 1 MG tablet Commonly known as: DELTASONE Take 1 mg by mouth daily with breakfast.   ProAir HFA 108 (90 Base) MCG/ACT inhaler Generic drug: albuterol Inhale 2 puffs into the lungs every 6 (six) hours as needed for wheezing or shortness of breath.   rOPINIRole 1 MG tablet Commonly known as: REQUIP Take 1 mg by mouth See admin instructions. 1mg  at noon, 1mg  2 hours before bed, then 1mg  at bedtime   senna-docusate 8.6-50 MG tablet Commonly known as: Senokot-S Take 1 tablet by mouth 2 (two) times daily.   Silenor 6 MG Tabs Generic drug: Doxepin HCl Take 0.5 tablets (3 mg total) by mouth at bedtime.   traMADol 50 MG tablet Commonly known as: ULTRAM Take 1 tablet (50 mg total) by mouth 2 (two) times daily.   Tums 500 MG chewable tablet Generic drug: calcium carbonate Chew 1 tablet by mouth 3 (three) times daily as needed for indigestion or heartburn.   Vitamin D3 1.25 MG (50000 UT) Caps Take 1 capsule by mouth once a week.    warfarin 2 MG tablet Commonly known as: COUMADIN Take 2 mg by mouth every Monday, Wednesday, and Friday.   warfarin 3 MG tablet Commonly known as: COUMADIN Take 1.5 mg by mouth See admin instructions. Tue-Thu-Sat-Sun       No orders of the defined types were placed in this encounter.   Immunization History  Administered Date(s) Administered  . Influenza-Unspecified 08/11/2017, 07/02/2018, 08/02/2019  . PPD Test 03/18/2017  . Pneumococcal Conjugate-13 09/17/2017  . Pneumococcal Polysaccharide-23 04/17/2019    Social History   Tobacco Use  . Smoking status: Never Smoker  . Smokeless tobacco: Never Used  Substance Use Topics  . Alcohol use: No     Vitals:   10/02/19 1552  BP: 125/67  Pulse: 62  Resp: 17  Temp: 98.1 F (36.7 C)  Body mass index is 24.87 kg/m.   [name] [name] Active Problem List   Diagnosis Date Noted  . Failure to thrive in adult 01/08/2019  . Moderate protein malnutrition ([place]) 01/08/2019  . Pressure injury of skin 12/18/2018  . Intertrochanteric fracture of left femur ([place]) 12/11/2018  . CKD (chronic kidney disease), stage III 12/11/2018  . Depression with anxiety 12/11/2018  . Overactive bladder 09/24/2017  . Insomnia 07/31/2017  . Anxiety 06/16/2017  . Citrobacter infection 03/19/2017  . AKI (acute kidney injury) ([place]) 03/19/2017  . History of pulmonary embolus (PE) 03/19/2017  . Elevated INR 03/19/2017  . Candidal intertrigo 03/19/2017  . Compression fracture of L2 ([place]) 03/12/2017  . Acute lower UTI 03/12/2017  . Accelerated hypertension 03/12/2017  . Leukocytosis 07/31/2015  . Acute bronchitis 07/28/2015  . Chronic anticoagulation 07/28/2015  . Intractable back pain 07/20/2015  . Back pain 07/20/2015  . Cellulitis and abscess of left buttock 04/12/2013  . Stricture and stenosis of esophagus 10/10/2012  . Dementia ([place]) 10/06/2012  . Dysphagia 10/06/2012  . Axillary abscess - bilateral, multiple 09/27/2012  . Hypotension  08/24/2012  . Near syncope 08/24/2012  . Abscess of axilla, left 06/27/2012  . Bradycardia 06/13/2012  . Abscess 06/11/2012  . Chest pain 06/09/2012  . Cellulitis 06/09/2012  . Weakness of both legs 06/09/2012  . PMR (polymyalgia rheumatica) (HCC)   . Restless leg syndrome   . Angina pectoris, unstable ([place]) 03/02/2012  . Acute blood loss as cause of postoperative anemia 03/02/2012  . Dyspnea 02/03/2012  . UTI (urinary tract infection) 02/03/2012  . CAD (coronary artery disease)   . HTN (hypertension)   . VTE (venous thromboembolism) 10/01/2010  . HYPERCHOLESTEROLEMIA  IIA 08/27/2009  . Hyperlipidemia 12/11/2008  . HYPERTENSION, BENIGN 12/11/2008    CMP     Component Value Date/Time   NA 141 06/12/2019   K 4.3 06/12/2019   CL 105 12/17/2018 0417   CO2 22 12/17/2018 0417   GLUCOSE 104 (H) 12/17/2018 0417   BUN 25 (A) 06/12/2019   CREATININE 0.8 06/12/2019   CREATININE 0.99 12/17/2018 0417   CALCIUM 7.7 (L) 12/17/2018 0417   PROT 6.7 03/11/2017 2031   ALBUMIN 3.4 (L) 03/11/2017 2031   AST 14 06/12/2019   ALT 12 06/12/2019   ALKPHOS 49 06/12/2019   BILITOT 0.5 03/11/2017 2031   GFRNONAA 48 (L) 12/17/2018 0417   GFRAA 56 (L) 12/17/2018 0417   Recent Labs    12/14/18 0536 12/15/18 0911 12/16/18 0611 12/17/18 0417 12/27/18 06/12/19  NA 138 138 136 135 142 141  K 3.9 3.4* 4.0 4.1 3.6 4.3  CL 110 108 108 105  --   --   CO2 22 23 22 22   --   --   GLUCOSE 144* 85 96 104*  --   --   BUN 25* 25* 21 20 32* 25*  CREATININE 1.13* 0.81 0.86 0.99 0.9 0.8  CALCIUM 8.3* 7.8* 7.8* 7.7*  --   --   MG 2.1 2.3 2.2  --   --   --    Recent Labs    06/12/19  AST 14  ALT 12  ALKPHOS 49   Recent Labs    12/16/18 0611 12/17/18 0417 12/18/18 0408 12/27/18 06/12/19  WBC 7.4 7.3 8.2 12.2 7.0  NEUTROABS  --  4.7 5.3  --  3  HGB 7.3* 9.4* 9.5* 9.0* 11.1*  HCT 24.5* 31.3* 31.4* 28* 35*  MCV 83.3 83.2 83.3  --   --  PLT 282 265 257 404* 279   No results for input(s): CHOL,  LDLCALC, TRIG in the last 8760 hours.  Invalid input(s): HCL No results found for: [facility] Lab Results  Component Value Date   TSH 1.71 08/01/2018   Lab Results  Component Value Date   HGBA1C 5.7 08/01/2018   Lab Results  Component Value Date   CHOL 140 08/01/2018   HDL 46 08/01/2018   LDLCALC 80 08/01/2018   TRIG 82 08/01/2018   CHOLHDL 2.4 03/03/2012    Significant Diagnostic Results in last 30 days:  No results found.  Assessment and Plan  History of PE/chronic V TE-anticoagulated on Coumadin-today [name] [name]'s INR is 1.9 on 2 mg of Coumadin Monday Tuesday Thursday Saturday and 1.5 mg on Sunday Wednesday Friday.  This will be changed to 2 mg on Monday Tuesday Wednesday Thursday Friday and 1.5 mg on Saturday Sunday with repeat INR in 1 week.     Hennie Duos , MD

## 2019-10-07 ENCOUNTER — Encounter: Payer: Self-pay | Admitting: Internal Medicine

## 2019-10-08 ENCOUNTER — Non-Acute Institutional Stay (SKILLED_NURSING_FACILITY): Payer: Medicare Other | Admitting: Internal Medicine

## 2019-10-08 ENCOUNTER — Other Ambulatory Visit: Payer: Self-pay | Admitting: Internal Medicine

## 2019-10-08 DIAGNOSIS — Z7901 Long term (current) use of anticoagulants: Secondary | ICD-10-CM | POA: Diagnosis not present

## 2019-10-08 DIAGNOSIS — I2782 Chronic pulmonary embolism: Secondary | ICD-10-CM

## 2019-10-08 MED ORDER — TRAMADOL HCL 50 MG PO TABS: 50.0000 mg | ORAL_TABLET | Freq: Two times a day (BID) | ORAL | 0 refills | Status: DC

## 2019-10-08 NOTE — Progress Notes (Signed)
Location:  [facility] Room Number: 204-D Place of Service:  SNF (31)  Hennie Duos, MD  Patient Care Team: Hennie Duos, MD as PCP - General (Internal Medicine)  Extended Emergency Contact Information Primary Emergency Contact: Kerstein,[name] Address: [address]          [place], [place] [address] [name] of [place] Phone: [phone] Mobile Phone: [phone] Relation: Son Secondary Emergency Contact: [place] of [place] Phone: [phone] Relation: Daughter    Allergies: Ambien [zolpidem tartrate], Codeine, Codeine phosphate, Darvocet [propoxyphene n-acetaminophen], Fish allergy, Fish-derived products, Hydrocodone, Promethazine, Promethazine hcl, Sulfamethoxazole, Vicodin [hydrocodone-acetaminophen], Amoxicillin, and Penicillins  Chief Complaint  Patient presents with  . Anticoagulation    INR check    HPI: Patient is a 85 y.o. female who is being seen for INR check for chronic PE.  Patient's INR today is 1.9.  Past Medical History:  Diagnosis Date  . Anemia 03/02/2012  . Angina pectoris, unstable ([place]) 03/02/2012  . Anxiety   . Axillary abscess   . Bradycardia 06/13/2012  . CAD (coronary artery disease)    S/p PTCA / stenting (last cath 2004, multiple LAD stents, 2 stents in the right coronary artery all patent)  . Cancer ([place])   . Complication of anesthesia   . Compression fracture of L2 ([place]) 03/12/2017  . Dementia ([place])   . Dysphagia 10/06/2012  . Dyspnea 02/03/2012  . GERD (gastroesophageal reflux disease)   . History of pulmonary embolus (PE) 03/19/2017  . HTN (hypertension)   . PMR (polymyalgia rheumatica) (HCC)   . PONV (postoperative nausea and vomiting)   . Restless leg syndrome   . 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 Started coumadin  . Weakness of both legs 06/09/2012    Past Surgical History:  Procedure Laterality Date  . BREAST LUMPECTOMY    .  COLECTOMY    . ESOPHAGOGASTRODUODENOSCOPY (EGD) WITH ESOPHAGEAL DILATION  10/10/2012   Procedure: ESOPHAGOGASTRODUODENOSCOPY (EGD) WITH ESOPHAGEAL DILATION;  Surgeon: Inda Castle, MD;  Location: [facility];  Service: Endoscopy;  Laterality: N/A;  . FEMUR IM NAIL Left 12/13/2018   Procedure: INTRAMEDULLARY (IM) NAIL FEMORAL LEFT;  Surgeon: Paralee Cancel, MD;  [facility];  Service: Orthopedics;  Laterality: Left;  . heart stents  x 8  . INCISION AND DRAINAGE     bilateral axillary, non specific staff  . INCISION AND DRAINAGE ABSCESS  09/28/2012   Procedure: INCISION AND DRAINAGE ABSCESS;  Surgeon: Zenovia Jarred, MD;  Location: [facility];  Service: General;  Laterality: Bilateral;  . stent     cardiac x 8 stents.    Allergies as of 10/08/2019      Reactions   Ambien [zolpidem Tartrate] Nausea And Vomiting   Codeine    Codeine Phosphate Nausea And Vomiting   Darvocet [propoxyphene N-acetaminophen] Nausea And Vomiting   Fish Allergy    Fish-derived Products    Hydrocodone    Promethazine    Promethazine Hcl Nausea And Vomiting   Sulfamethoxazole Other (See Comments)   Pt doesn't remember reaction   Vicodin [hydrocodone-acetaminophen] Other (See Comments)   Unknown reaction   Amoxicillin Nausea And Vomiting, Rash   Penicillins Nausea And Vomiting, Rash      Medication List       Accurate as of October 08, 2019 11:59 PM. If you have any questions, ask your nurse or doctor.        acetaminophen 325  MG tablet Commonly known as: TYLENOL Take 650 mg by mouth every 6 (six) hours as needed for mild pain, moderate pain or fever.   acetaminophen 325 MG tablet Commonly known as: TYLENOL Take 650 mg by mouth every 6 (six) hours. Take routinely for left hip pain   ALPRAZolam 0.25 MG tablet Commonly known as: XANAX Take 0.25 mg by mouth daily.   amLODipine 10 MG tablet Commonly known as: NORVASC Take 10 mg by mouth every morning.   aspirin EC 81 MG tablet Take 1 tablet  (81 mg total) by mouth daily.   atorvastatin 10 MG tablet Commonly known as: LIPITOR Take 10 mg by mouth every evening.   donepezil 10 MG tablet Commonly known as: ARICEPT Take 10 mg by mouth every evening.   losartan 50 MG tablet Commonly known as: COZAAR Take 50 mg by mouth at bedtime.   nitroGLYCERIN 0.4 MG SL tablet Commonly known as: NITROSTAT Place 0.4 mg under the tongue every 5 (five) minutes as needed for chest pain.   NON FORMULARY Diet Type:  Liberalized due to poor appetite to Mech soft   NUTRITIONAL SUPPLEMENT PO Take 1 each by mouth 2 (two) times a day. Magic Cup - take with lunch and dinner   ondansetron 4 MG tablet Commonly known as: ZOFRAN Take 4 mg by mouth every 4 (four) hours as needed for nausea.   oxybutynin 5 MG tablet Commonly known as: DITROPAN Take 5 mg by mouth 2 (two) times daily.   polyethylene glycol 17 g packet Commonly known as: MIRALAX / GLYCOLAX Take 17 g by mouth daily as needed for moderate constipation.   predniSONE 1 MG tablet Commonly known as: DELTASONE Take 1 mg by mouth daily with breakfast.   ProAir HFA 108 (90 Base) MCG/ACT inhaler Generic drug: albuterol Inhale 2 puffs into the lungs every 6 (six) hours as needed for wheezing or shortness of breath.   rOPINIRole 1 MG tablet Commonly known as: REQUIP Take 1 mg by mouth See admin instructions. 1mg  at noon, 1mg  2 hours before bed, then 1mg  at bedtime   senna-docusate 8.6-50 MG tablet Commonly known as: Senokot-S Take 1 tablet by mouth 2 (two) times daily.   Silenor 6 MG Tabs Generic drug: Doxepin HCl Take 0.5 tablets (3 mg total) by mouth at bedtime.   traMADol 50 MG tablet Commonly known as: ULTRAM Take 1 tablet (50 mg total) by mouth 2 (two) times daily.   Tums 500 MG chewable tablet Generic drug: calcium carbonate Chew 1 tablet by mouth 3 (three) times daily as needed for indigestion or heartburn.   vitamin C 500 MG tablet Commonly known as: ASCORBIC ACID  Take 500 mg by mouth daily.   Vitamin D3 1.25 MG (50000 UT) Caps Take 1 capsule by mouth once a week.   warfarin 2 MG tablet Commonly known as: COUMADIN Take 2 mg by mouth See admin instructions. M-F What changed: Another medication with the same name was removed. Continue taking this medication, and follow the directions you see here. Changed by: Inocencio Homes, MD   zinc sulfate 220 (50 Zn) MG capsule Take 220 mg by mouth every other day.       No orders of the defined types were placed in this encounter.   Immunization History  Administered Date(s) Administered  . Influenza-Unspecified 08/11/2017, 07/02/2018, 08/02/2019  . PPD Test 03/18/2017  . Pneumococcal Conjugate-13 09/17/2017  . Pneumococcal Polysaccharide-23 04/17/2019    Social History   Tobacco Use  .  Smoking status: Never Smoker  . Smokeless tobacco: Never Used  Substance Use Topics  . Alcohol use: No     Vitals:   10/08/19 1612  BP: 112/65  Pulse: 72  Resp: 18  Temp: (!) 97.1 F (36.2 C)   Body mass index is 24.87 kg/m.   Patient Active Problem List   Diagnosis Date Noted  . Failure to thrive in adult 01/08/2019  . Moderate protein malnutrition ([place]) 01/08/2019  . Pressure injury of skin 12/18/2018  . Intertrochanteric fracture of left femur ([place]) 12/11/2018  . CKD (chronic kidney disease), stage III 12/11/2018  . Depression with anxiety 12/11/2018  . Overactive bladder 09/24/2017  . Insomnia 07/31/2017  . Anxiety 06/16/2017  . Citrobacter infection 03/19/2017  . AKI (acute kidney injury) ([place]) 03/19/2017  . History of pulmonary embolus (PE) 03/19/2017  . Elevated INR 03/19/2017  . Candidal intertrigo 03/19/2017  . Compression fracture of L2 ([place]) 03/12/2017  . Acute lower UTI 03/12/2017  . Accelerated hypertension 03/12/2017  . Leukocytosis 07/31/2015  . Acute bronchitis 07/28/2015  . Chronic anticoagulation 07/28/2015  . Intractable back pain 07/20/2015  . Back pain 07/20/2015   . Cellulitis and abscess of left buttock 04/12/2013  . Stricture and stenosis of esophagus 10/10/2012  . Dementia ([place]) 10/06/2012  . Dysphagia 10/06/2012  . Axillary abscess - bilateral, multiple 09/27/2012  . Hypotension 08/24/2012  . Near syncope 08/24/2012  . Abscess of axilla, left 06/27/2012  . Bradycardia 06/13/2012  . Abscess 06/11/2012  . Chest pain 06/09/2012  . Cellulitis 06/09/2012  . Weakness of both legs 06/09/2012  . PMR (polymyalgia rheumatica) (HCC)   . Restless leg syndrome   . Angina pectoris, unstable ([place]) 03/02/2012  . Acute blood loss as cause of postoperative anemia 03/02/2012  . Dyspnea 02/03/2012  . UTI (urinary tract infection) 02/03/2012  . CAD (coronary artery disease)   . HTN (hypertension)   . VTE (venous thromboembolism) 10/01/2010  . HYPERCHOLESTEROLEMIA  IIA 08/27/2009  . Hyperlipidemia 12/11/2008  . HYPERTENSION, BENIGN 12/11/2008    CMP     Component Value Date/Time   NA 141 06/12/2019 0000   K 4.3 06/12/2019 0000   CL 105 12/17/2018 0417   CO2 22 12/17/2018 0417   GLUCOSE 104 (H) 12/17/2018 0417   BUN 25 (A) 06/12/2019 0000   CREATININE 0.8 06/12/2019 0000   CREATININE 0.99 12/17/2018 0417   CALCIUM 7.7 (L) 12/17/2018 0417   PROT 6.7 03/11/2017 2031   ALBUMIN 3.4 (L) 03/11/2017 2031   AST 14 06/12/2019 0000   ALT 12 06/12/2019 0000   ALKPHOS 49 06/12/2019 0000   BILITOT 0.5 03/11/2017 2031   GFRNONAA 48 (L) 12/17/2018 0417   GFRAA 56 (L) 12/17/2018 0417   Recent Labs    12/14/18 0536 12/15/18 0911 12/16/18 0611 12/17/18 0417 12/27/18 0000 06/12/19 0000  NA 138 138 136 135 142 141  K 3.9 3.4* 4.0 4.1 3.6 4.3  CL 110 108 108 105  --   --   CO2 22 23 22 22   --   --   GLUCOSE 144* 85 96 104*  --   --   BUN 25* 25* 21 20 32* 25*  CREATININE 1.13* 0.81 0.86 0.99 0.9 0.8  CALCIUM 8.3* 7.8* 7.8* 7.7*  --   --   MG 2.1 2.3 2.2  --   --   --    Recent Labs    06/12/19 0000  AST 14  ALT 12  ALKPHOS 49   Recent  Labs     12/16/18 0611 12/17/18 0417 12/18/18 0408 12/27/18 0000 06/12/19 0000  WBC 7.4 7.3 8.2 12.2 7.0  NEUTROABS  --  4.7 5.3  --  3  HGB 7.3* 9.4* 9.5* 9.0* 11.1*  HCT 24.5* 31.3* 31.4* 28* 35*  MCV 83.3 83.2 83.3  --   --   PLT 282 265 257 404* 279   No results for input(s): CHOL, LDLCALC, TRIG in the last 8760 hours.  Invalid input(s): HCL No results found for: [facility] Lab Results  Component Value Date   TSH 1.71 08/01/2018   Lab Results  Component Value Date   HGBA1C 5.7 08/01/2018   Lab Results  Component Value Date   CHOL 140 08/01/2018   HDL 46 08/01/2018   LDLCALC 80 08/01/2018   TRIG 82 08/01/2018   CHOLHDL 2.4 03/03/2012    Significant Diagnostic Results in last 30 days:  No results found.  Assessment and Plan  Chronic PE/anticoagulated on Coumadin-patient's INR is 1.9.  We will continue with current regimen of 2 mg on Monday Tuesday Wednesday Thursday Friday and 1.5 mg on Saturday and Sunday.  Repeat INR 1 week    Hennie Duos , MD

## 2019-10-15 ENCOUNTER — Non-Acute Institutional Stay (SKILLED_NURSING_FACILITY): Payer: Medicare Other | Admitting: Internal Medicine

## 2019-10-15 DIAGNOSIS — I2782 Chronic pulmonary embolism: Secondary | ICD-10-CM

## 2019-10-15 DIAGNOSIS — Z7901 Long term (current) use of anticoagulants: Secondary | ICD-10-CM

## 2019-10-15 NOTE — Progress Notes (Signed)
Location:  [facility] Room Number: 204-D Place of Service:  SNF (31)  Hennie Duos, MD  Patient Care Team: Hennie Duos, MD as PCP - General (Internal Medicine)  Extended Emergency Contact Information Primary Emergency Contact: Carchi,[name] Address: [address]          [place], [place] [address] [name] of [place] Phone: [phone] Mobile Phone: [phone] Relation: Son Secondary Emergency Contact: [place] of [place] Phone: [phone] Relation: Daughter    Allergies: Ambien [zolpidem tartrate], Codeine, Codeine phosphate, Darvocet [propoxyphene n-acetaminophen], Fish allergy, Fish-derived products, Hydrocodone, Promethazine, Promethazine hcl, Sulfamethoxazole, Vicodin [hydrocodone-acetaminophen], Amoxicillin, and Penicillins  Chief Complaint  Patient presents with  . Anticoagulation    INR management    HPI: Patient is 85 y.o. female who is being seen for chronic PE and INR check.  Patient's INR is 3.2.  Patient has no complaints.  Past Medical History:  Diagnosis Date  . Anemia 03/02/2012  . Angina pectoris, unstable ([place]) 03/02/2012  . Anxiety   . Axillary abscess   . Bradycardia 06/13/2012  . CAD (coronary artery disease)    S/p PTCA / stenting (last cath 2004, multiple LAD stents, 2 stents in the right coronary artery all patent)  . Cancer ([place])   . Complication of anesthesia   . Compression fracture of L2 ([place]) 03/12/2017  . Dementia ([place])   . Dysphagia 10/06/2012  . Dyspnea 02/03/2012  . GERD (gastroesophageal reflux disease)   . History of pulmonary embolus (PE) 03/19/2017  . HTN (hypertension)   . PMR (polymyalgia rheumatica) (HCC)   . PONV (postoperative nausea and vomiting)   . Restless leg syndrome   . 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 Started coumadin  . Weakness of both legs 06/09/2012    Past Surgical History:  Procedure Laterality Date  .  BREAST LUMPECTOMY    . COLECTOMY    . ESOPHAGOGASTRODUODENOSCOPY (EGD) WITH ESOPHAGEAL DILATION  10/10/2012   Procedure: ESOPHAGOGASTRODUODENOSCOPY (EGD) WITH ESOPHAGEAL DILATION;  Surgeon: Inda Castle, MD;  Location: [facility];  Service: Endoscopy;  Laterality: N/A;  . FEMUR IM NAIL Left 12/13/2018   Procedure: INTRAMEDULLARY (IM) NAIL FEMORAL LEFT;  Surgeon: Paralee Cancel, MD;  [facility];  Service: Orthopedics;  Laterality: Left;  . heart stents  x 8  . INCISION AND DRAINAGE     bilateral axillary, non specific staff  . INCISION AND DRAINAGE ABSCESS  09/28/2012   Procedure: INCISION AND DRAINAGE ABSCESS;  Surgeon: Zenovia Jarred, MD;  Location: [facility];  Service: General;  Laterality: Bilateral;  . stent     cardiac x 8 stents.    Allergies as of 10/15/2019      Reactions   Ambien [zolpidem Tartrate] Nausea And Vomiting   Codeine    Codeine Phosphate Nausea And Vomiting   Darvocet [propoxyphene N-acetaminophen] Nausea And Vomiting   Fish Allergy    Fish-derived Products    Hydrocodone    Promethazine    Promethazine Hcl Nausea And Vomiting   Sulfamethoxazole Other (See Comments)   Pt doesn't remember reaction   Vicodin [hydrocodone-acetaminophen] Other (See Comments)   Unknown reaction   Amoxicillin Nausea And Vomiting, Rash   Penicillins Nausea And Vomiting, Rash      Medication List       Accurate as of October 15, 2019 11:59 PM. If you have any questions, ask your nurse or doctor.  acetaminophen 325 MG tablet Commonly known as: TYLENOL Take 650 mg by mouth every 6 (six) hours as needed for mild pain, moderate pain or fever.   acetaminophen 325 MG tablet Commonly known as: TYLENOL Take 650 mg by mouth every 6 (six) hours. Take routinely for left hip pain   ALPRAZolam 0.25 MG tablet Commonly known as: XANAX Take 0.25 mg by mouth daily.   amLODipine 10 MG tablet Commonly known as: NORVASC Take 10 mg by mouth every morning.   aspirin EC 81  MG tablet Take 1 tablet (81 mg total) by mouth daily.   atorvastatin 10 MG tablet Commonly known as: LIPITOR Take 10 mg by mouth every evening.   donepezil 10 MG tablet Commonly known as: ARICEPT Take 10 mg by mouth every evening.   losartan 50 MG tablet Commonly known as: COZAAR Take 50 mg by mouth at bedtime.   nitroGLYCERIN 0.4 MG SL tablet Commonly known as: NITROSTAT Place 0.4 mg under the tongue every 5 (five) minutes as needed for chest pain.   NON FORMULARY Diet Type:  Liberalized due to poor appetite to Mech soft   NUTRITIONAL SUPPLEMENT PO Take 1 each by mouth 2 (two) times a day. Magic Cup - take with lunch and dinner   ondansetron 4 MG tablet Commonly known as: ZOFRAN Take 4 mg by mouth every 4 (four) hours as needed for nausea.   oxybutynin 5 MG tablet Commonly known as: DITROPAN Take 5 mg by mouth 2 (two) times daily.   polyethylene glycol 17 g packet Commonly known as: MIRALAX / GLYCOLAX Take 17 g by mouth daily as needed for moderate constipation.   predniSONE 1 MG tablet Commonly known as: DELTASONE Take 1 mg by mouth daily with breakfast.   ProAir HFA 108 (90 Base) MCG/ACT inhaler Generic drug: albuterol Inhale 2 puffs into the lungs every 6 (six) hours as needed for wheezing or shortness of breath.   rOPINIRole 1 MG tablet Commonly known as: REQUIP Take 1 mg by mouth See admin instructions. 1mg  at noon, 1mg  2 hours before bed, then 1mg  at bedtime   senna-docusate 8.6-50 MG tablet Commonly known as: Senokot-S Take 1 tablet by mouth 2 (two) times daily.   Silenor 6 MG Tabs Generic drug: Doxepin HCl Take 0.5 tablets (3 mg total) by mouth at bedtime.   traMADol 50 MG tablet Commonly known as: ULTRAM Take 1 tablet (50 mg total) by mouth 2 (two) times daily.   Tums 500 MG chewable tablet Generic drug: calcium carbonate Chew 1 tablet by mouth 3 (three) times daily as needed for indigestion or heartburn.   vitamin C 500 MG tablet Commonly  known as: ASCORBIC ACID Take 500 mg by mouth daily.   Vitamin D3 1.25 MG (50000 UT) Caps Take 1 capsule by mouth once a week.   warfarin 2 MG tablet Commonly known as: COUMADIN Take 2 mg by mouth See admin instructions. M-F   warfarin 3 MG tablet Commonly known as: COUMADIN Take 1.5 mg by mouth every Sunday. Take 0.5 tablet to = 1.5 mg   zinc sulfate 220 (50 Zn) MG capsule Take 220 mg by mouth every other day.       No orders of the defined types were placed in this encounter.   Immunization History  Administered Date(s) Administered  . Influenza-Unspecified 08/11/2017, 07/02/2018, 08/02/2019  . PPD Test 03/18/2017  . Pneumococcal Conjugate-13 09/17/2017  . Pneumococcal Polysaccharide-23 04/17/2019    Social History   Tobacco Use  .  Smoking status: Never Smoker  . Smokeless tobacco: Never Used  Substance Use Topics  . Alcohol use: No    Review of Systems  DATA OBTAINED: from patient, nurse, medical record, family member GENERAL:  no fevers, fatigue, appetite changes SKIN: No itching, rash HEENT: No complaint RESPIRATORY: No cough, wheezing, SOB CARDIAC: No chest pain, palpitations, lower extremity edema  GI: No abdominal pain, No N/V/D or constipation, No heartburn or reflux  GU: No dysuria, frequency or urgency, or incontinence  MUSCULOSKELETAL: No unrelieved bone/joint pain NEUROLOGIC: No headache, dizziness  PSYCHIATRIC: No overt anxiety or sadness  Vitals:   10/15/19 1158  BP: 127/82  Pulse: 66  Resp: 18  Temp: 97.6 F (36.4 C)   Body mass index is 25.21 kg/m. Physical Exam  GENERAL APPEARANCE: Alert, conversant, No acute distress  SKIN: No diaphoresis rash HEENT: Unremarkable RESPIRATORY: Breathing is even, unlabored. Lung sounds are clear   CARDIOVASCULAR: Heart RRR no murmurs, rubs or gallops. No peripheral edema  GASTROINTESTINAL: Abdomen is soft, non-tender, not distended w/ normal bowel sounds.  GENITOURINARY: Bladder non tender, not  distended  MUSCULOSKELETAL: No abnormal joints or musculature NEUROLOGIC: Cranial nerves 2-12 grossly intact. Moves all extremities PSYCHIATRIC: Mood and affect appropriate to situation, no behavioral issues  Patient Active Problem List   Diagnosis Date Noted  . Failure to thrive in adult 01/08/2019  . Moderate protein malnutrition ([place]) 01/08/2019  . Pressure injury of skin 12/18/2018  . Intertrochanteric fracture of left femur ([place]) 12/11/2018  . CKD (chronic kidney disease), stage III 12/11/2018  . Depression with anxiety 12/11/2018  . Overactive bladder 09/24/2017  . Insomnia 07/31/2017  . Anxiety 06/16/2017  . Citrobacter infection 03/19/2017  . AKI (acute kidney injury) ([place]) 03/19/2017  . History of pulmonary embolus (PE) 03/19/2017  . Elevated INR 03/19/2017  . Candidal intertrigo 03/19/2017  . Compression fracture of L2 ([place]) 03/12/2017  . Acute lower UTI 03/12/2017  . Accelerated hypertension 03/12/2017  . Leukocytosis 07/31/2015  . Acute bronchitis 07/28/2015  . Chronic anticoagulation 07/28/2015  . Intractable back pain 07/20/2015  . Back pain 07/20/2015  . Cellulitis and abscess of left buttock 04/12/2013  . Stricture and stenosis of esophagus 10/10/2012  . Dementia ([place]) 10/06/2012  . Dysphagia 10/06/2012  . Axillary abscess - bilateral, multiple 09/27/2012  . Hypotension 08/24/2012  . Near syncope 08/24/2012  . Abscess of axilla, left 06/27/2012  . Bradycardia 06/13/2012  . Abscess 06/11/2012  . Chest pain 06/09/2012  . Cellulitis 06/09/2012  . Weakness of both legs 06/09/2012  . PMR (polymyalgia rheumatica) (HCC)   . Restless leg syndrome   . Angina pectoris, unstable ([place]) 03/02/2012  . Acute blood loss as cause of postoperative anemia 03/02/2012  . Dyspnea 02/03/2012  . UTI (urinary tract infection) 02/03/2012  . CAD (coronary artery disease)   . HTN (hypertension)   . VTE (venous thromboembolism) 10/01/2010  . HYPERCHOLESTEROLEMIA  IIA 08/27/2009    . Hyperlipidemia 12/11/2008  . HYPERTENSION, BENIGN 12/11/2008    CMP     Component Value Date/Time   NA 141 06/12/2019 0000   K 4.3 06/12/2019 0000   CL 105 12/17/2018 0417   CO2 22 12/17/2018 0417   GLUCOSE 104 (H) 12/17/2018 0417   BUN 25 (A) 06/12/2019 0000   CREATININE 0.8 06/12/2019 0000   CREATININE 0.99 12/17/2018 0417   CALCIUM 7.7 (L) 12/17/2018 0417   PROT 6.7 03/11/2017 2031   ALBUMIN 3.4 (L) 03/11/2017 2031   AST 14 06/12/2019 0000   ALT  12 06/12/2019 0000   ALKPHOS 49 06/12/2019 0000   BILITOT 0.5 03/11/2017 2031   GFRNONAA 48 (L) 12/17/2018 0417   GFRAA 56 (L) 12/17/2018 0417   Recent Labs    12/14/18 0536 12/15/18 0911 12/16/18 0611 12/17/18 0417 12/27/18 0000 06/12/19 0000  NA 138 138 136 135 142 141  K 3.9 3.4* 4.0 4.1 3.6 4.3  CL 110 108 108 105  --   --   CO2 22 23 22 22   --   --   GLUCOSE 144* 85 96 104*  --   --   BUN 25* 25* 21 20 32* 25*  CREATININE 1.13* 0.81 0.86 0.99 0.9 0.8  CALCIUM 8.3* 7.8* 7.8* 7.7*  --   --   MG 2.1 2.3 2.2  --   --   --    Recent Labs    06/12/19 0000  AST 14  ALT 12  ALKPHOS 49   Recent Labs    12/16/18 0611 12/17/18 0417 12/18/18 0408 12/27/18 0000 06/12/19 0000  WBC 7.4 7.3 8.2 12.2 7.0  NEUTROABS  --  4.7 5.3  --  3  HGB 7.3* 9.4* 9.5* 9.0* 11.1*  HCT 24.5* 31.3* 31.4* 28* 35*  MCV 83.3 83.2 83.3  --   --   PLT 282 265 257 404* 279   No results for input(s): CHOL, LDLCALC, TRIG in the last 8760 hours.  Invalid input(s): HCL No results found for: [facility] Lab Results  Component Value Date   TSH 1.71 08/01/2018   Lab Results  Component Value Date   HGBA1C 5.7 08/01/2018   Lab Results  Component Value Date   CHOL 140 08/01/2018   HDL 46 08/01/2018   LDLCALC 80 08/01/2018   TRIG 82 08/01/2018   CHOLHDL 2.4 03/03/2012    Significant Diagnostic Results in last 30 days:  No results found.  Assessment and Plan  Chronic PE/anticoagulated on Coumadin-patient's INR is 3.2 on  Coumadin 2 mg Monday through Friday and 1.5 mg on Saturday and Sunday.  Need to recheck INR daily until it is less than or equal to 2.7 at which time we will start 1.5 mg on Monday Wednesday Friday and 2 mg on Tuesday Thursday Saturday Sunday.  Repeat INR on Friday 12/18 just to make sure she is not going out again.   Hennie Duos, MD

## 2019-10-17 ENCOUNTER — Encounter: Payer: Self-pay | Admitting: Internal Medicine

## 2019-10-23 ENCOUNTER — Encounter: Payer: Self-pay | Admitting: Internal Medicine

## 2019-10-23 ENCOUNTER — Non-Acute Institutional Stay (SKILLED_NURSING_FACILITY): Payer: Medicare Other | Admitting: Internal Medicine

## 2019-10-23 DIAGNOSIS — Z7901 Long term (current) use of anticoagulants: Secondary | ICD-10-CM

## 2019-10-23 DIAGNOSIS — H353131 Nonexudative age-related macular degeneration, bilateral, early dry stage: Secondary | ICD-10-CM | POA: Diagnosis not present

## 2019-10-23 DIAGNOSIS — I2782 Chronic pulmonary embolism: Secondary | ICD-10-CM

## 2019-10-23 DIAGNOSIS — H04123 Dry eye syndrome of bilateral lacrimal glands: Secondary | ICD-10-CM | POA: Diagnosis not present

## 2019-10-23 DIAGNOSIS — Z961 Presence of intraocular lens: Secondary | ICD-10-CM | POA: Diagnosis not present

## 2019-10-23 DIAGNOSIS — H43813 Vitreous degeneration, bilateral: Secondary | ICD-10-CM | POA: Diagnosis not present

## 2019-10-23 NOTE — Progress Notes (Signed)
Location:  [facility] Room Number: 204-D Place of Service:  SNF (31)  Hennie Duos, MD  Patient Care Team: Hennie Duos, MD as PCP - General (Internal Medicine)  Extended Emergency Contact Information Primary Emergency Contact: Kloss,[name] Address: [address]          [place], [place] [address] [name] of [place] Phone: [phone] Mobile Phone: [phone] Relation: Son Secondary Emergency Contact: [place] of [place] Phone: [phone] Relation: Daughter    Allergies: Ambien [zolpidem tartrate], Codeine, Codeine phosphate, Darvocet [propoxyphene n-acetaminophen], Fish allergy, Fish-derived products, Hydrocodone, Promethazine, Promethazine hcl, Sulfamethoxazole, Vicodin [hydrocodone-acetaminophen], Amoxicillin, and Penicillins  Chief Complaint  Patient presents with  . Acute Visit    HPI: Patient is 84 y.o. female with chronic PE/DVT on Coumadin who is being seen today for an INR check.  Patient's INR is 2.8.  Past Medical History:  Diagnosis Date  . Anemia 03/02/2012  . Angina pectoris, unstable ([place]) 03/02/2012  . Anxiety   . Axillary abscess   . Bradycardia 06/13/2012  . CAD (coronary artery disease)    S/p PTCA / stenting (last cath 2004, multiple LAD stents, 2 stents in the right coronary artery all patent)  . Cancer ([place])   . Complication of anesthesia   . Compression fracture of L2 ([place]) 03/12/2017  . Dementia ([place])   . Dysphagia 10/06/2012  . Dyspnea 02/03/2012  . GERD (gastroesophageal reflux disease)   . History of pulmonary embolus (PE) 03/19/2017  . HTN (hypertension)   . PMR (polymyalgia rheumatica) (HCC)   . PONV (postoperative nausea and vomiting)   . Restless leg syndrome   . 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 Started coumadin  . Weakness of both legs 06/09/2012    Past Surgical History:  Procedure Laterality Date  . BREAST LUMPECTOMY    .  COLECTOMY    . ESOPHAGOGASTRODUODENOSCOPY (EGD) WITH ESOPHAGEAL DILATION  10/10/2012   Procedure: ESOPHAGOGASTRODUODENOSCOPY (EGD) WITH ESOPHAGEAL DILATION;  Surgeon: Inda Castle, MD;  Location: [facility];  Service: Endoscopy;  Laterality: N/A;  . FEMUR IM NAIL Left 12/13/2018   Procedure: INTRAMEDULLARY (IM) NAIL FEMORAL LEFT;  Surgeon: Paralee Cancel, MD;  [facility];  Service: Orthopedics;  Laterality: Left;  . heart stents  x 8  . INCISION AND DRAINAGE     bilateral axillary, non specific staff  . INCISION AND DRAINAGE ABSCESS  09/28/2012   Procedure: INCISION AND DRAINAGE ABSCESS;  Surgeon: Zenovia Jarred, MD;  Location: [facility];  Service: General;  Laterality: Bilateral;  . stent     cardiac x 8 stents.    Allergies as of 10/23/2019      Reactions   Ambien [zolpidem Tartrate] Nausea And Vomiting   Codeine    Codeine Phosphate Nausea And Vomiting   Darvocet [propoxyphene N-acetaminophen] Nausea And Vomiting   Fish Allergy    Fish-derived Products    Hydrocodone    Promethazine    Promethazine Hcl Nausea And Vomiting   Sulfamethoxazole Other (See Comments)   Pt doesn't remember reaction   Vicodin [hydrocodone-acetaminophen] Other (See Comments)   Unknown reaction   Amoxicillin Nausea And Vomiting, Rash   Penicillins Nausea And Vomiting, Rash      Medication List       Accurate as of October 23, 2019 11:59 PM. If you have any questions, ask your nurse or doctor.        acetaminophen 325 MG tablet  Commonly known as: TYLENOL Take 650 mg by mouth every 6 (six) hours as needed for mild pain, moderate pain or fever.   acetaminophen 325 MG tablet Commonly known as: TYLENOL Take 650 mg by mouth every 6 (six) hours. Take routinely for left hip pain   ALPRAZolam 0.25 MG tablet Commonly known as: XANAX Take 0.25 mg by mouth daily.   amLODipine 10 MG tablet Commonly known as: NORVASC Take 10 mg by mouth every morning.   aspirin EC 81 MG tablet Take 1  tablet (81 mg total) by mouth daily.   atorvastatin 10 MG tablet Commonly known as: LIPITOR Take 10 mg by mouth every evening.   donepezil 10 MG tablet Commonly known as: ARICEPT Take 10 mg by mouth every evening.   losartan 50 MG tablet Commonly known as: COZAAR Take 50 mg by mouth at bedtime.   nitroGLYCERIN 0.4 MG SL tablet Commonly known as: NITROSTAT Place 0.4 mg under the tongue every 5 (five) minutes as needed for chest pain.   NON FORMULARY Diet Type:  Liberalized due to poor appetite to Mech soft   NUTRITIONAL SUPPLEMENT PO Take 1 each by mouth 2 (two) times a day. Magic Cup - take with lunch and dinner   ondansetron 4 MG tablet Commonly known as: ZOFRAN Take 4 mg by mouth every 4 (four) hours as needed for nausea.   oxybutynin 5 MG tablet Commonly known as: DITROPAN Take 5 mg by mouth 2 (two) times daily.   polyethylene glycol 17 g packet Commonly known as: MIRALAX / GLYCOLAX Take 17 g by mouth daily as needed for moderate constipation.   predniSONE 1 MG tablet Commonly known as: DELTASONE Take 1 mg by mouth daily with breakfast.   ProAir HFA 108 (90 Base) MCG/ACT inhaler Generic drug: albuterol Inhale 2 puffs into the lungs every 6 (six) hours as needed for wheezing or shortness of breath.   rOPINIRole 1 MG tablet Commonly known as: REQUIP Take 1 mg by mouth See admin instructions. 1mg  at noon, 1mg  2 hours before bed, then 1mg  at bedtime   senna-docusate 8.6-50 MG tablet Commonly known as: Senokot-S Take 1 tablet by mouth 2 (two) times daily.   Silenor 6 MG Tabs Generic drug: Doxepin HCl Take 0.5 tablets (3 mg total) by mouth at bedtime.   traMADol 50 MG tablet Commonly known as: ULTRAM Take 1 tablet (50 mg total) by mouth 2 (two) times daily.   Tums 500 MG chewable tablet Generic drug: calcium carbonate Chew 1 tablet by mouth 3 (three) times daily as needed for indigestion or heartburn.   vitamin C 500 MG tablet Commonly known as: ASCORBIC  ACID Take 500 mg by mouth daily.   Vitamin D3 1.25 MG (50000 UT) Caps Take 1 capsule by mouth once a week.   warfarin 2 MG tablet Commonly known as: COUMADIN Take 2 mg by mouth See admin instructions. Thursday, Saturday, Sunday   warfarin 3 MG tablet Commonly known as: COUMADIN Take 1.5 mg by mouth every Sunday. Monday, Tues, Wednesday, Friday   zinc sulfate 220 (50 Zn) MG capsule Take 220 mg by mouth every other day.       No orders of the defined types were placed in this encounter.   Immunization History  Administered Date(s) Administered  . Influenza-Unspecified 08/11/2017, 07/02/2018, 08/02/2019  . PPD Test 03/18/2017  . Pneumococcal Conjugate-13 09/17/2017  . Pneumococcal Polysaccharide-23 04/17/2019    Social History   Tobacco Use  . Smoking status: Never Smoker  .  Smokeless tobacco: Never Used  Substance Use Topics  . Alcohol use: No     Vitals:   10/23/19 1555  BP: (!) 145/86  Pulse: 96  Resp: 18  Temp: 98.8 F (37.1 C)  SpO2: 97%   Body mass index is 25.21 kg/m.   Patient Active Problem List   Diagnosis Date Noted  . Failure to thrive in adult 01/08/2019  . Moderate protein malnutrition ([place]) 01/08/2019  . Pressure injury of skin 12/18/2018  . Intertrochanteric fracture of left femur ([place]) 12/11/2018  . CKD (chronic kidney disease), stage III 12/11/2018  . Depression with anxiety 12/11/2018  . Overactive bladder 09/24/2017  . Insomnia 07/31/2017  . Anxiety 06/16/2017  . Citrobacter infection 03/19/2017  . AKI (acute kidney injury) ([place]) 03/19/2017  . History of pulmonary embolus (PE) 03/19/2017  . Elevated INR 03/19/2017  . Candidal intertrigo 03/19/2017  . Compression fracture of L2 ([place]) 03/12/2017  . Acute lower UTI 03/12/2017  . Accelerated hypertension 03/12/2017  . Leukocytosis 07/31/2015  . Acute bronchitis 07/28/2015  . Chronic anticoagulation 07/28/2015  . Intractable back pain 07/20/2015  . Back pain 07/20/2015  .  Cellulitis and abscess of left buttock 04/12/2013  . Stricture and stenosis of esophagus 10/10/2012  . Dementia ([place]) 10/06/2012  . Dysphagia 10/06/2012  . Axillary abscess - bilateral, multiple 09/27/2012  . Hypotension 08/24/2012  . Near syncope 08/24/2012  . Abscess of axilla, left 06/27/2012  . Bradycardia 06/13/2012  . Abscess 06/11/2012  . Chest pain 06/09/2012  . Cellulitis 06/09/2012  . Weakness of both legs 06/09/2012  . PMR (polymyalgia rheumatica) (HCC)   . Restless leg syndrome   . Angina pectoris, unstable ([place]) 03/02/2012  . Acute blood loss as cause of postoperative anemia 03/02/2012  . Dyspnea 02/03/2012  . UTI (urinary tract infection) 02/03/2012  . CAD (coronary artery disease)   . HTN (hypertension)   . VTE (venous thromboembolism) 10/01/2010  . HYPERCHOLESTEROLEMIA  IIA 08/27/2009  . Hyperlipidemia 12/11/2008  . HYPERTENSION, BENIGN 12/11/2008    CMP     Component Value Date/Time   NA 137 09/20/2019 0000   K 4.8 09/20/2019 0000   CL 106 09/20/2019 0000   CO2 21 09/20/2019 0000   GLUCOSE 104 (H) 12/17/2018 0417   BUN 22 (A) 09/20/2019 0000   CREATININE 0.9 09/20/2019 0000   CREATININE 0.99 12/17/2018 0417   CALCIUM 8.5 (A) 09/20/2019 0000   PROT 6.7 03/11/2017 2031   ALBUMIN 3.4 (L) 03/11/2017 2031   AST 14 06/12/2019 0000   ALT 12 06/12/2019 0000   ALKPHOS 49 06/12/2019 0000   BILITOT 0.5 03/11/2017 2031   GFRNONAA 56.34 09/20/2019 0000   GFRAA 65.30 09/20/2019 0000   Recent Labs    12/14/18 0536 12/15/18 0911 12/16/18 0611 12/17/18 0417 12/27/18 0000 06/12/19 0000 09/20/19 0000  NA 138 138 136 135 142 141 137  K 3.9 3.4* 4.0 4.1 3.6 4.3 4.8  CL 110 108 108 105  --   --  106  CO2 22 23 22 22   --   --  21  GLUCOSE 144* 85 96 104*  --   --   --   BUN 25* 25* 21 20 32* 25* 22*  CREATININE 1.13* 0.81 0.86 0.99 0.9 0.8 0.9  CALCIUM 8.3* 7.8* 7.8* 7.7*  --   --  8.5*  MG 2.1 2.3 2.2  --   --   --   --    Recent Labs    06/12/19 0000  AST 14  ALT 12  ALKPHOS 49   Recent Labs    12/16/18 0611 12/17/18 0417 12/18/18 0408 12/27/18 0000 06/12/19 0000 09/20/19 0000  WBC 7.4 7.3 8.2 12.2 7.0 7.0  NEUTROABS  --  4.7 5.3  --  3 3  HGB 7.3* 9.4* 9.5* 9.0* 11.1* 10.9*  HCT 24.5* 31.3* 31.4* 28* 35* 34*  MCV 83.3 83.2 83.3  --   --   --   PLT 282 265 257 404* 279 303   No results for input(s): CHOL, LDLCALC, TRIG in the last 8760 hours.  Invalid input(s): HCL No results found for: [facility] Lab Results  Component Value Date   TSH 1.71 08/01/2018   Lab Results  Component Value Date   HGBA1C 5.7 08/01/2018   Lab Results  Component Value Date   CHOL 140 08/01/2018   HDL 46 08/01/2018   LDLCALC 80 08/01/2018   TRIG 82 08/01/2018   CHOLHDL 2.4 03/03/2012    Significant Diagnostic Results in last 30 days:  No results found.  Assessment and Plan  Chronic PE/anticoagulated on Coumadin-patient's INR is 2.8 on 1.5 mg of Coumadin Monday Wednesday Friday and 2 mg the other 4 days.  2.8 is little high so we will change the dose to 1.5 mg Monday Tuesday Wednesday Friday and 2 mg on Thursday Saturday Sunday with repeat INR in 1 week    Hennie Duos , MD

## 2019-10-28 ENCOUNTER — Encounter: Payer: Self-pay | Admitting: Internal Medicine

## 2019-10-30 ENCOUNTER — Non-Acute Institutional Stay (SKILLED_NURSING_FACILITY): Payer: Medicare Other | Admitting: Internal Medicine

## 2019-10-30 DIAGNOSIS — Z7901 Long term (current) use of anticoagulants: Secondary | ICD-10-CM

## 2019-10-30 DIAGNOSIS — I8290 Acute embolism and thrombosis of unspecified vein: Secondary | ICD-10-CM

## 2019-10-31 ENCOUNTER — Encounter: Payer: Self-pay | Admitting: Internal Medicine

## 2019-10-31 NOTE — Progress Notes (Signed)
Location:  [facility] Room Number: 204-D Place of Service:  SNF (31)  Hennie Duos, MD  Patient Care Team: Hennie Duos, MD as PCP - General (Internal Medicine)  Extended Emergency Contact Information Primary Emergency Contact: Medders,[name] Address: [address]          [place], [place] [address] [name] Phone: [phone] Mobile Phone: [phone] Relation: Son Secondary Emergency Contact: [place] of [place] Phone: [phone] Relation: Daughter    Allergies: Ambien [zolpidem tartrate], Codeine, Codeine phosphate, Darvocet [propoxyphene n-acetaminophen], Fish allergy, Fish-derived products, Hydrocodone, Promethazine, Promethazine hcl, Sulfamethoxazole, Vicodin [hydrocodone-acetaminophen], Amoxicillin, and Penicillins  Chief Complaint  Patient presents with  . Anticoagulation    INR management    HPI: Patient is a 85 y.o. female who is anticoagulated on Coumadin secondary to history of PE/VTE.,  Who is being seen for INR check.  Today patient's INR is 2.6, which is therapeutic  Past Medical History:  Diagnosis Date  . Anemia 03/02/2012  . Angina pectoris, unstable ([place]) 03/02/2012  . Anxiety   . Axillary abscess   . Bradycardia 06/13/2012  . CAD (coronary artery disease)    S/p PTCA / stenting (last cath 2004, multiple LAD stents, 2 stents in the right coronary artery all patent)  . Cancer ([place])   . Complication of anesthesia   . Compression fracture of L2 ([place]) 03/12/2017  . Dementia ([place])   . Dysphagia 10/06/2012  . Dyspnea 02/03/2012  . GERD (gastroesophageal reflux disease)   . History of pulmonary embolus (PE) 03/19/2017  . HTN (hypertension)   . PMR (polymyalgia rheumatica) (HCC)   . PONV (postoperative nausea and vomiting)   . Restless leg syndrome   . 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 Started coumadin  . Weakness of both legs 06/09/2012    Past  Surgical History:  Procedure Laterality Date  . BREAST LUMPECTOMY    . COLECTOMY    . ESOPHAGOGASTRODUODENOSCOPY (EGD) WITH ESOPHAGEAL DILATION  10/10/2012   Procedure: ESOPHAGOGASTRODUODENOSCOPY (EGD) WITH ESOPHAGEAL DILATION;  Surgeon: Inda Castle, MD;  Location: [facility];  Service: Endoscopy;  Laterality: N/A;  . FEMUR IM NAIL Left 12/13/2018   Procedure: INTRAMEDULLARY (IM) NAIL FEMORAL LEFT;  Surgeon: Paralee Cancel, MD;  [facility];  Service: Orthopedics;  Laterality: Left;  . heart stents  x 8  . INCISION AND DRAINAGE     bilateral axillary, non specific staff  . INCISION AND DRAINAGE ABSCESS  09/28/2012   Procedure: INCISION AND DRAINAGE ABSCESS;  Surgeon: Zenovia Jarred, MD;  Location: [facility];  Service: General;  Laterality: Bilateral;  . stent     cardiac x 8 stents.    Allergies as of 10/30/2019      Reactions   Ambien [zolpidem Tartrate] Nausea And Vomiting   Codeine    Codeine Phosphate Nausea And Vomiting   Darvocet [propoxyphene N-acetaminophen] Nausea And Vomiting   Fish Allergy    Fish-derived Products    Hydrocodone    Promethazine    Promethazine Hcl Nausea And Vomiting   Sulfamethoxazole Other (See Comments)   Pt doesn't remember reaction   Vicodin [hydrocodone-acetaminophen] Other (See Comments)   Unknown reaction   Amoxicillin Nausea And Vomiting, Rash   Penicillins Nausea And Vomiting, Rash      Medication List       Accurate as of October 30, 2019 11:59 PM. If you have any questions, ask your nurse  or doctor.        acetaminophen 325 MG tablet Commonly known as: TYLENOL Take 650 mg by mouth every 6 (six) hours as needed for mild pain, moderate pain or fever.   acetaminophen 325 MG tablet Commonly known as: TYLENOL Take 650 mg by mouth every 6 (six) hours. Take routinely for left hip pain   ALPRAZolam 0.25 MG tablet Commonly known as: XANAX Take 0.25 mg by mouth daily.   amLODipine 10 MG tablet Commonly known as:  NORVASC Take 10 mg by mouth every morning.   aspirin EC 81 MG tablet Take 1 tablet (81 mg total) by mouth daily.   atorvastatin 10 MG tablet Commonly known as: LIPITOR Take 10 mg by mouth every evening.   donepezil 10 MG tablet Commonly known as: ARICEPT Take 10 mg by mouth every evening.   losartan 50 MG tablet Commonly known as: COZAAR Take 50 mg by mouth at bedtime.   nitroGLYCERIN 0.4 MG SL tablet Commonly known as: NITROSTAT Place 0.4 mg under the tongue every 5 (five) minutes as needed for chest pain.   NON FORMULARY Diet Type:  Liberalized due to poor appetite to Mech soft   NUTRITIONAL SUPPLEMENT PO Take 1 each by mouth 2 (two) times a day. Magic Cup - take with lunch and dinner   ondansetron 4 MG tablet Commonly known as: ZOFRAN Take 4 mg by mouth every 4 (four) hours as needed for nausea.   oxybutynin 5 MG tablet Commonly known as: DITROPAN Take 5 mg by mouth 2 (two) times daily.   polyethylene glycol 17 g packet Commonly known as: MIRALAX / GLYCOLAX Take 17 g by mouth daily as needed for moderate constipation.   predniSONE 1 MG tablet Commonly known as: DELTASONE Take 1 mg by mouth daily with breakfast.   ProAir HFA 108 (90 Base) MCG/ACT inhaler Generic drug: albuterol Inhale 2 puffs into the lungs every 6 (six) hours as needed for wheezing or shortness of breath.   rOPINIRole 1 MG tablet Commonly known as: REQUIP Take 1 mg by mouth See admin instructions. 1mg  at noon, 1mg  2 hours before bed, then 1mg  at bedtime   senna-docusate 8.6-50 MG tablet Commonly known as: Senokot-S Take 1 tablet by mouth 2 (two) times daily.   Silenor 6 MG Tabs Generic drug: Doxepin HCl Take 0.5 tablets (3 mg total) by mouth at bedtime.   traMADol 50 MG tablet Commonly known as: ULTRAM Take 1 tablet (50 mg total) by mouth 2 (two) times daily.   Tums 500 MG chewable tablet Generic drug: calcium carbonate Chew 1 tablet by mouth 3 (three) times daily as needed for  indigestion or heartburn.   vitamin C 500 MG tablet Commonly known as: ASCORBIC ACID Take 500 mg by mouth daily.   Vitamin D3 1.25 MG (50000 UT) Caps Take 1 capsule by mouth once a week.   warfarin 2 MG tablet Commonly known as: COUMADIN Take 2 mg by mouth See admin instructions. Thursday, Saturday, Sunday   warfarin 3 MG tablet Commonly known as: COUMADIN Take 1.5 mg by mouth every Sunday. Monday, Tues, Wednesday, Friday   zinc sulfate 220 (50 Zn) MG capsule Take 220 mg by mouth every other day.       No orders of the defined types were placed in this encounter.   Immunization History  Administered Date(s) Administered  . Influenza-Unspecified 08/11/2017, 07/02/2018, 08/02/2019  . PPD Test 03/18/2017  . Pneumococcal Conjugate-13 09/17/2017  . Pneumococcal Polysaccharide-23 04/17/2019  Social History   Tobacco Use  . Smoking status: Never Smoker  . Smokeless tobacco: Never Used  Substance Use Topics  . Alcohol use: No     Vitals:   10/31/19 1345  BP: 116/76  Pulse: 68  Resp: 18  Temp: (!) 97.3 F (36.3 C)  SpO2: 97%   Body mass index is 25.21 kg/m.   Patient Active Problem List   Diagnosis Date Noted  . Failure to thrive in adult 01/08/2019  . Moderate protein malnutrition ([place]) 01/08/2019  . Pressure injury of skin 12/18/2018  . Intertrochanteric fracture of left femur ([place]) 12/11/2018  . CKD (chronic kidney disease), stage III 12/11/2018  . Depression with anxiety 12/11/2018  . Overactive bladder 09/24/2017  . Insomnia 07/31/2017  . Anxiety 06/16/2017  . Citrobacter infection 03/19/2017  . AKI (acute kidney injury) ([place]) 03/19/2017  . History of pulmonary embolus (PE) 03/19/2017  . Elevated INR 03/19/2017  . Candidal intertrigo 03/19/2017  . Compression fracture of L2 ([place]) 03/12/2017  . Acute lower UTI 03/12/2017  . Accelerated hypertension 03/12/2017  . Leukocytosis 07/31/2015  . Acute bronchitis 07/28/2015  . Chronic anticoagulation  07/28/2015  . Intractable back pain 07/20/2015  . Back pain 07/20/2015  . Cellulitis and abscess of left buttock 04/12/2013  . Stricture and stenosis of esophagus 10/10/2012  . Dementia ([place]) 10/06/2012  . Dysphagia 10/06/2012  . Axillary abscess - bilateral, multiple 09/27/2012  . Hypotension 08/24/2012  . Near syncope 08/24/2012  . Abscess of axilla, left 06/27/2012  . Bradycardia 06/13/2012  . Abscess 06/11/2012  . Chest pain 06/09/2012  . Cellulitis 06/09/2012  . Weakness of both legs 06/09/2012  . PMR (polymyalgia rheumatica) (HCC)   . Restless leg syndrome   . Angina pectoris, unstable ([place]) 03/02/2012  . Acute blood loss as cause of postoperative anemia 03/02/2012  . Dyspnea 02/03/2012  . UTI (urinary tract infection) 02/03/2012  . CAD (coronary artery disease)   . HTN (hypertension)   . VTE (venous thromboembolism) 10/01/2010  . HYPERCHOLESTEROLEMIA  IIA 08/27/2009  . Hyperlipidemia 12/11/2008  . HYPERTENSION, BENIGN 12/11/2008    CMP     Component Value Date/Time   NA 137 09/20/2019 0000   K 4.8 09/20/2019 0000   CL 106 09/20/2019 0000   CO2 21 09/20/2019 0000   GLUCOSE 104 (H) 12/17/2018 0417   BUN 22 (A) 09/20/2019 0000   CREATININE 0.9 09/20/2019 0000   CREATININE 0.99 12/17/2018 0417   CALCIUM 8.5 (A) 09/20/2019 0000   PROT 6.7 03/11/2017 2031   ALBUMIN 3.4 (L) 03/11/2017 2031   AST 14 06/12/2019 0000   ALT 12 06/12/2019 0000   ALKPHOS 49 06/12/2019 0000   BILITOT 0.5 03/11/2017 2031   GFRNONAA 56.34 09/20/2019 0000   GFRAA 65.30 09/20/2019 0000   Recent Labs    12/14/18 0536 12/15/18 0911 12/16/18 0611 12/17/18 0417 12/27/18 0000 06/12/19 0000 09/20/19 0000  NA 138 138 136 135 142 141 137  K 3.9 3.4* 4.0 4.1 3.6 4.3 4.8  CL 110 108 108 105  --   --  106  CO2 22 23 22 22   --   --  21  GLUCOSE 144* 85 96 104*  --   --   --   BUN 25* 25* 21 20 32* 25* 22*  CREATININE 1.13* 0.81 0.86 0.99 0.9 0.8 0.9  CALCIUM 8.3* 7.8* 7.8* 7.7*  --   --   8.5*  MG 2.1 2.3 2.2  --   --   --   --  Recent Labs    06/12/19 0000  AST 14  ALT 12  ALKPHOS 49   Recent Labs    12/16/18 0611 12/17/18 0417 12/18/18 0408 12/27/18 0000 06/12/19 0000 09/20/19 0000  WBC 7.4 7.3 8.2 12.2 7.0 7.0  NEUTROABS  --  4.7 5.3  --  3 3  HGB 7.3* 9.4* 9.5* 9.0* 11.1* 10.9*  HCT 24.5* 31.3* 31.4* 28* 35* 34*  MCV 83.3 83.2 83.3  --   --   --   PLT 282 265 257 404* 279 303   No results for input(s): CHOL, LDLCALC, TRIG in the last 8760 hours.  Invalid input(s): HCL No results found for: [facility] Lab Results  Component Value Date   TSH 1.71 08/01/2018   Lab Results  Component Value Date   HGBA1C 5.7 08/01/2018   Lab Results  Component Value Date   CHOL 140 08/01/2018   HDL 46 08/01/2018   LDLCALC 80 08/01/2018   TRIG 82 08/01/2018   CHOLHDL 2.4 03/03/2012    Significant Diagnostic Results in last 30 days:  No results found.  Assessment and Plan  Chronic DVT/PE-patient's INR is 2.6 which is therapeutic on 1.5 mg of Coumadin Monday Tuesday Wednesday Friday and 2 mg on Thursday Saturday and Sunday.  We will continue same regimen and repeat INR in 1 week.  No problem-specific Assessment & Plan notes found for this encounter.   Labs/tests ordered:    Hennie Duos , MD

## 2019-11-03 ENCOUNTER — Encounter: Payer: Self-pay | Admitting: Internal Medicine

## 2019-11-07 ENCOUNTER — Other Ambulatory Visit: Payer: Self-pay | Admitting: Internal Medicine

## 2019-11-07 MED ORDER — TRAMADOL HCL 50 MG PO TABS: 50.0000 mg | ORAL_TABLET | Freq: Two times a day (BID) | ORAL | 0 refills | Status: DC

## 2019-11-13 ENCOUNTER — Encounter: Payer: Self-pay | Admitting: Internal Medicine

## 2019-11-13 ENCOUNTER — Non-Acute Institutional Stay (SKILLED_NURSING_FACILITY): Payer: Medicare Other | Admitting: Internal Medicine

## 2019-11-13 DIAGNOSIS — I8290 Acute embolism and thrombosis of unspecified vein: Secondary | ICD-10-CM | POA: Diagnosis not present

## 2019-11-13 DIAGNOSIS — Z7901 Long term (current) use of anticoagulants: Secondary | ICD-10-CM | POA: Diagnosis not present

## 2019-11-13 NOTE — Progress Notes (Signed)
Location:  [facility] Room Number: 204-D Place of Service:  SNF (31)  Hennie Duos, MD  Patient Care Team: Hennie Duos, MD as PCP - General (Internal Medicine)  Extended Emergency Contact Information Primary Emergency Contact: Kizziah,[name] Address: [address]          [place], [place] [address] [name] of [place] Phone: [phone] Mobile Phone: [phone] Relation: Son Secondary Emergency Contact: [place] of [place] Phone: [phone] Relation: Daughter    Allergies: Ambien [zolpidem tartrate], Codeine, Codeine phosphate, Darvocet [propoxyphene n-acetaminophen], Fish allergy, Fish-derived products, Hydrocodone, Promethazine, Promethazine hcl, Sulfamethoxazole, Vicodin [hydrocodone-acetaminophen], Amoxicillin, and Penicillins  Chief Complaint  Patient presents with  . Medical Management of Chronic Issues    Routine [name] visit    HPI: Patient is 86 y.o. female with chronic DVT/PE on Coumadin is being seen for an INR check.  Today patient's INR is 2.5, which is therapeutic.  Past Medical History:  Diagnosis Date  . Anemia 03/02/2012  . Angina pectoris, unstable ([place]) 03/02/2012  . Anxiety   . Axillary abscess   . Bradycardia 06/13/2012  . CAD (coronary artery disease)    S/p PTCA / stenting (last cath 2004, multiple LAD stents, 2 stents in the right coronary artery all patent)  . Cancer ([place])   . Complication of anesthesia   . Compression fracture of L2 ([place]) 03/12/2017  . Dementia ([place])   . Dysphagia 10/06/2012  . Dyspnea 02/03/2012  . GERD (gastroesophageal reflux disease)   . History of pulmonary embolus (PE) 03/19/2017  . HTN (hypertension)   . PMR (polymyalgia rheumatica) (HCC)   . PONV (postoperative nausea and vomiting)   . Restless leg syndrome   . 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 Started coumadin  . Weakness of both legs 06/09/2012    Past  Surgical History:  Procedure Laterality Date  . BREAST LUMPECTOMY    . COLECTOMY    . ESOPHAGOGASTRODUODENOSCOPY (EGD) WITH ESOPHAGEAL DILATION  10/10/2012   Procedure: ESOPHAGOGASTRODUODENOSCOPY (EGD) WITH ESOPHAGEAL DILATION;  Surgeon: Inda Castle, MD;  Location: [facility];  Service: Endoscopy;  Laterality: N/A;  . FEMUR IM NAIL Left 12/13/2018   Procedure: INTRAMEDULLARY (IM) NAIL FEMORAL LEFT;  Surgeon: Paralee Cancel, MD;  [facility];  Service: Orthopedics;  Laterality: Left;  . heart stents  x 8  . INCISION AND DRAINAGE     bilateral axillary, non specific staff  . INCISION AND DRAINAGE ABSCESS  09/28/2012   Procedure: INCISION AND DRAINAGE ABSCESS;  Surgeon: Zenovia Jarred, MD;  Location: [facility];  Service: General;  Laterality: Bilateral;  . stent     cardiac x 8 stents.    Allergies as of 11/13/2019      Reactions   Ambien [zolpidem Tartrate] Nausea And Vomiting   Codeine    Codeine Phosphate Nausea And Vomiting   Darvocet [propoxyphene N-acetaminophen] Nausea And Vomiting   Fish Allergy    Fish-derived Products    Hydrocodone    Promethazine    Promethazine Hcl Nausea And Vomiting   Sulfamethoxazole Other (See Comments)   Pt doesn't remember reaction   Vicodin [hydrocodone-acetaminophen] Other (See Comments)   Unknown reaction   Amoxicillin Nausea And Vomiting, Rash   Penicillins Nausea And Vomiting, Rash      Medication List       Accurate as of November 13, 2019 11:59 PM. If you have any questions, ask your nurse  or doctor.        acetaminophen 325 MG tablet Commonly known as: TYLENOL Take 650 mg by mouth every 6 (six) hours as needed for mild pain, moderate pain or fever.   acetaminophen 325 MG tablet Commonly known as: TYLENOL Take 650 mg by mouth every 6 (six) hours. Take routinely for left hip pain   ALPRAZolam 0.25 MG tablet Commonly known as: XANAX Take 0.25 mg by mouth daily.   amLODipine 10 MG tablet Commonly known as: NORVASC Take  10 mg by mouth every morning.   aspirin EC 81 MG tablet Take 1 tablet (81 mg total) by mouth daily.   atorvastatin 10 MG tablet Commonly known as: LIPITOR Take 10 mg by mouth every evening.   donepezil 10 MG tablet Commonly known as: ARICEPT Take 10 mg by mouth every evening.   losartan 50 MG tablet Commonly known as: COZAAR Take 50 mg by mouth at bedtime.   nitroGLYCERIN 0.4 MG SL tablet Commonly known as: NITROSTAT Place 0.4 mg under the tongue every 5 (five) minutes as needed for chest pain.   NON FORMULARY Diet Type:  Liberalized due to poor appetite to Mech soft   NUTRITIONAL SUPPLEMENT PO Take 1 each by mouth 2 (two) times a day. Magic Cup - take with lunch and dinner   ondansetron 4 MG tablet Commonly known as: ZOFRAN Take 4 mg by mouth every 4 (four) hours as needed for nausea.   oxybutynin 5 MG tablet Commonly known as: DITROPAN Take 5 mg by mouth 2 (two) times daily.   polyethylene glycol 17 g packet Commonly known as: MIRALAX / GLYCOLAX Take 17 g by mouth daily as needed for moderate constipation.   predniSONE 1 MG tablet Commonly known as: DELTASONE Take 1 mg by mouth daily with breakfast.   ProAir HFA 108 (90 Base) MCG/ACT inhaler Generic drug: albuterol Inhale 2 puffs into the lungs every 6 (six) hours as needed for wheezing or shortness of breath.   rOPINIRole 1 MG tablet Commonly known as: REQUIP Take 1 mg by mouth See admin instructions. 1mg  at noon, 1mg  2 hours before bed, then 1mg  at bedtime   senna-docusate 8.6-50 MG tablet Commonly known as: Senokot-S Take 1 tablet by mouth 2 (two) times daily.   Silenor 6 MG Tabs Generic drug: Doxepin HCl Take 0.5 tablets (3 mg total) by mouth at bedtime.   traMADol 50 MG tablet Commonly known as: ULTRAM Take 1 tablet (50 mg total) by mouth 2 (two) times daily.   Tums 500 MG chewable tablet Generic drug: calcium carbonate Chew 1 tablet by mouth 3 (three) times daily as needed for indigestion or  heartburn.   vitamin C 500 MG tablet Commonly known as: ASCORBIC ACID Take 500 mg by mouth daily.   Vitamin D3 1.25 MG (50000 UT) Caps Take 1 capsule by mouth once a week.   warfarin 2 MG tablet Commonly known as: COUMADIN Take 2 mg by mouth See admin instructions. Thursday, Saturday, Sunday   warfarin 3 MG tablet Commonly known as: COUMADIN Take 1.5 mg by mouth See admin instructions. Monday, Tues, Wednesday, Friday   zinc sulfate 220 (50 Zn) MG capsule Take 220 mg by mouth every other day.       No orders of the defined types were placed in this encounter.   Immunization History  Administered Date(s) Administered  . Influenza-Unspecified 08/11/2017, 07/02/2018, 08/02/2019  . Moderna SARS-COVID-2 Vaccination 11/01/2019  . PPD Test 03/18/2017  . Pneumococcal Conjugate-13 09/17/2017  .  Pneumococcal Polysaccharide-23 04/17/2019    Social History   Tobacco Use  . Smoking status: Never Smoker  . Smokeless tobacco: Never Used  Substance Use Topics  . Alcohol use: No     Vitals:   11/13/19 0927  BP: (!) 129/52  Pulse: (!) 57  Resp: 20  Temp: 97.6 F (36.4 C)  SpO2: 97%   Body mass index is 25.21 kg/m.   Patient Active Problem List   Diagnosis Date Noted  . Failure to thrive in adult 01/08/2019  . Moderate protein malnutrition ([place]) 01/08/2019  . Pressure injury of skin 12/18/2018  . Intertrochanteric fracture of left femur ([place]) 12/11/2018  . CKD (chronic kidney disease), stage III 12/11/2018  . Depression with anxiety 12/11/2018  . Overactive bladder 09/24/2017  . Insomnia 07/31/2017  . Anxiety 06/16/2017  . Citrobacter infection 03/19/2017  . AKI (acute kidney injury) ([place]) 03/19/2017  . History of pulmonary embolus (PE) 03/19/2017  . Elevated INR 03/19/2017  . Candidal intertrigo 03/19/2017  . Compression fracture of L2 ([place]) 03/12/2017  . Acute lower UTI 03/12/2017  . Accelerated hypertension 03/12/2017  . Leukocytosis 07/31/2015  . Acute  bronchitis 07/28/2015  . Chronic anticoagulation 07/28/2015  . Intractable back pain 07/20/2015  . Back pain 07/20/2015  . Cellulitis and abscess of left buttock 04/12/2013  . Stricture and stenosis of esophagus 10/10/2012  . Dementia ([place]) 10/06/2012  . Dysphagia 10/06/2012  . Axillary abscess - bilateral, multiple 09/27/2012  . Hypotension 08/24/2012  . Near syncope 08/24/2012  . Abscess of axilla, left 06/27/2012  . Bradycardia 06/13/2012  . Abscess 06/11/2012  . Chest pain 06/09/2012  . Cellulitis 06/09/2012  . Weakness of both legs 06/09/2012  . PMR (polymyalgia rheumatica) (HCC)   . Restless leg syndrome   . Angina pectoris, unstable ([place]) 03/02/2012  . Acute blood loss as cause of postoperative anemia 03/02/2012  . Dyspnea 02/03/2012  . UTI (urinary tract infection) 02/03/2012  . CAD (coronary artery disease)   . HTN (hypertension)   . VTE (venous thromboembolism) 10/01/2010  . HYPERCHOLESTEROLEMIA  IIA 08/27/2009  . Hyperlipidemia 12/11/2008  . HYPERTENSION, BENIGN 12/11/2008    CMP     Component Value Date/Time   NA 137 09/20/2019 0000   K 4.8 09/20/2019 0000   CL 106 09/20/2019 0000   CO2 21 09/20/2019 0000   GLUCOSE 104 (H) 12/17/2018 0417   BUN 22 (A) 09/20/2019 0000   CREATININE 0.9 09/20/2019 0000   CREATININE 0.99 12/17/2018 0417   CALCIUM 8.5 (A) 09/20/2019 0000   PROT 6.7 03/11/2017 2031   ALBUMIN 3.4 (L) 03/11/2017 2031   AST 14 06/12/2019 0000   ALT 12 06/12/2019 0000   ALKPHOS 49 06/12/2019 0000   BILITOT 0.5 03/11/2017 2031   GFRNONAA 56.34 09/20/2019 0000   GFRAA 65.30 09/20/2019 0000   Recent Labs    12/14/18 0536 12/14/18 0536 12/15/18 0911 12/15/18 0911 12/16/18 [id number] 12/16/18 0611 12/17/18 0417 12/17/18 0417 12/27/18 0000 06/12/19 0000 09/20/19 0000  NA 138   < > 138   < > 136   < > 135  --  142 141 137  K 3.9   < > 3.4*   < > 4.0   < > 4.1   < > 3.6 4.3 4.8  CL 110   < > 108   < > 108  --  105  --   --   --  106  CO2 22   <  > 23   < >  22  --  22  --   --   --  21  GLUCOSE 144*   < > 85  --  96  --  104*  --   --   --   --   BUN 25*   < > 25*   < > 21   < > 20  --  32* 25* 22*  CREATININE 1.13*   < > 0.81   < > 0.86   < > 0.99  --  0.9 0.8 0.9  CALCIUM 8.3*   < > 7.8*   < > 7.8*  --  7.7*  --   --   --  8.5*  MG 2.1  --  2.3  --  2.2  --   --   --   --   --   --    < > = values in this interval not displayed.   Recent Labs    06/12/19 0000  AST 14  ALT 12  ALKPHOS 49   Recent Labs    12/16/18 0611 12/16/18 0611 12/17/18 0417 12/17/18 0417 12/18/18 0408 12/18/18 0408 12/27/18 0000 06/12/19 0000 09/20/19 0000  WBC 7.4   < > 7.3   < > 8.2  --  12.2 7.0 7.0  NEUTROABS  --   --  4.7   < > 5.3  --   --  3 3  HGB 7.3*   < > 9.4*   < > 9.5*   < > 9.0* 11.1* 10.9*  HCT 24.5*   < > 31.3*   < > 31.4*   < > 28* 35* 34*  MCV 83.3  --  83.2  --  83.3  --   --   --   --   PLT 282   < > 265   < > 257   < > 404* 279 303   < > = values in this interval not displayed.   No results for input(s): CHOL, LDLCALC, TRIG in the last 8760 hours.  Invalid input(s): HCL No results found for: [facility] Lab Results  Component Value Date   TSH 1.71 08/01/2018   Lab Results  Component Value Date   HGBA1C 5.7 08/01/2018   Lab Results  Component Value Date   CHOL 140 08/01/2018   HDL 46 08/01/2018   LDLCALC 80 08/01/2018   TRIG 82 08/01/2018   CHOLHDL 2.4 03/03/2012    Significant Diagnostic Results in last 30 days:  No results found.  Assessment and Plan  Chronic DVT/anticoagulated on Coumadin-patient's INR is 2.5 which is therapeutic.  Patient is on 1.5 mg of Coumadin Monday Wednesday Friday and 2 mg of Coumadin every other day of the week.  We will continue current regimen and repeat INR in 1 week.    Hennie Duos , MD

## 2019-11-19 ENCOUNTER — Encounter: Payer: Self-pay | Admitting: Internal Medicine

## 2019-11-19 ENCOUNTER — Non-Acute Institutional Stay (SKILLED_NURSING_FACILITY): Payer: Medicare Other | Admitting: Internal Medicine

## 2019-11-19 DIAGNOSIS — Z7901 Long term (current) use of anticoagulants: Secondary | ICD-10-CM

## 2019-11-19 DIAGNOSIS — Z5181 Encounter for therapeutic drug level monitoring: Secondary | ICD-10-CM

## 2019-11-19 DIAGNOSIS — K625 Hemorrhage of anus and rectum: Secondary | ICD-10-CM | POA: Diagnosis not present

## 2019-11-19 NOTE — Progress Notes (Signed)
Location:    [facility] Room Number: 204/D Place of Service:  SNF [phone]) Provider:  Henreitta Leber, MD  Patient Care Team: Hennie Duos, MD as PCP - General (Internal Medicine)  Extended Emergency Contact Information Primary Emergency Contact: Fishburn,[name] Address: [place], [place] [address] [name] of [place] Phone: [phone] Mobile Phone: [phone] Relation: Son Secondary Emergency Contact: Lydia of [place] Phone: [phone] Relation: Daughter  Code Status:  DNR Goals of care: Advanced Directive information Advanced Directives 11/19/2019  Does Patient Have a Medical Advance Directive? Yes  Type of Advance Directive Out of facility DNR (pink MOST or yellow form)  Does patient want to make changes to medical advance directive? -  Copy of [place] in Chart? -  Pre-existing out of facility DNR order (yellow form or pink MOST form) Yellow form placed in chart (order not valid for inpatient use)     Chief Complaint  Patient presents with  . Acute Visit    Blood in Stool    HPI:  Pt is a 86 y.o. female seen today for an acute visit for reported blood in stool.  Apparently when patient was having a large bowel movement some blood was noted-this apparently was fairly bright red blood.  There is been no further reports of bleeding.  Patient is a somewhat poor historian secondary to dementia but she does not have any complaints today.  Vital signs appear to be stable.  Of note she is on Coumadin INR today was actually slightly subtherapeutic at 1.7.  She is on Coumadin for history of pulmonary embolism.  As well as DVT.  She is also on low-dose aspirin with a history of coronary artery disease  Currently she is resting in bed comfortably and does not have any complaints does not complain of any abdominal pain nursing does not  report any recent issues   Past Medical History:  Diagnosis Date  . Anemia 03/02/2012  . Angina pectoris, unstable ([place]) 03/02/2012  . Anxiety   . Axillary abscess   . Bradycardia 06/13/2012  . CAD (coronary artery disease)    S/p PTCA / stenting (last cath 2004, multiple LAD stents, 2 stents in the right coronary artery all patent)  . Cancer ([place])   . Complication of anesthesia   . Compression fracture of L2 ([place]) 03/12/2017  . Dementia ([place])   . Dysphagia 10/06/2012  . Dyspnea 02/03/2012  . GERD (gastroesophageal reflux disease)   . History of pulmonary embolus (PE) 03/19/2017  . HTN (hypertension)   . PMR (polymyalgia rheumatica) (HCC)   . PONV (postoperative nausea and vomiting)   . Restless leg syndrome   . 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 Started coumadin  . Weakness of both legs 06/09/2012   Past Surgical History:  Procedure Laterality Date  . BREAST LUMPECTOMY    . COLECTOMY    . ESOPHAGOGASTRODUODENOSCOPY (EGD) WITH ESOPHAGEAL DILATION  10/10/2012   Procedure: ESOPHAGOGASTRODUODENOSCOPY (EGD) WITH ESOPHAGEAL DILATION;  Surgeon: Inda Castle, MD;  Location: [facility];  Service: Endoscopy;  Laterality: N/A;  . FEMUR IM NAIL Left 12/13/2018   Procedure: INTRAMEDULLARY (IM) NAIL FEMORAL LEFT;  Surgeon: Paralee Cancel, MD;  Location[facility];  Service: Orthopedics;  Laterality: Left;  . heart stents  x 8  . INCISION AND DRAINAGE  bilateral axillary, non specific staff  . INCISION AND DRAINAGE ABSCESS  09/28/2012   Procedure: INCISION AND DRAINAGE ABSCESS;  Surgeon: Zenovia Jarred, MD;  Location: [facility];  Service: General;  Laterality: Bilateral;  . stent     cardiac x 8 stents.    Allergies  Allergen Reactions  . Ambien [Zolpidem Tartrate] Nausea And Vomiting  . Codeine   . Codeine Phosphate Nausea And Vomiting  . Darvocet [Propoxyphene N-Acetaminophen] Nausea And Vomiting  . Fish Allergy   . Fish-Derived Products   . Hydrocodone   . Promethazine    . Promethazine Hcl Nausea And Vomiting  . Sulfamethoxazole Other (See Comments)    Pt doesn't remember reaction  . Vicodin [Hydrocodone-Acetaminophen] Other (See Comments)    Unknown reaction  . Amoxicillin Nausea And Vomiting and Rash  . Penicillins Nausea And Vomiting and Rash    Outpatient Encounter Medications as of 11/19/2019  Medication Sig  . acetaminophen (TYLENOL) 325 MG tablet Take 650 mg by mouth every 6 (six) hours as needed for mild pain, moderate pain or fever.  Marland Kitchen acetaminophen (TYLENOL) 325 MG tablet Take 650 mg by mouth every 6 (six) hours. Take routinely for left hip pain  . albuterol (PROAIR HFA) 108 (90 Base) MCG/ACT inhaler Inhale 2 puffs into the lungs every 6 (six) hours as needed for wheezing or shortness of breath.  . ALPRAZolam (XANAX) 0.25 MG tablet Take 0.25 mg by mouth daily.  Marland Kitchen amLODipine (NORVASC) 10 MG tablet Take 10 mg by mouth every morning.   Marland Kitchen aspirin EC 81 MG tablet Take 1 tablet (81 mg total) by mouth daily.  Marland Kitchen atorvastatin (LIPITOR) 10 MG tablet Take 10 mg by mouth every evening.  . calcium carbonate (TUMS) 500 MG chewable tablet Chew 1 tablet by mouth 3 (three) times daily as needed for indigestion or heartburn.  . Cholecalciferol (VITAMIN D3) 1.25 MG (50000 UT) CAPS Take 1 capsule by mouth once a week.  . donepezil (ARICEPT) 10 MG tablet Take 10 mg by mouth every evening.   Marland Kitchen losartan (COZAAR) 50 MG tablet Take 50 mg by mouth at bedtime.   . nitroGLYCERIN (NITROSTAT) 0.4 MG SL tablet Place 0.4 mg under the tongue every 5 (five) minutes as needed for chest pain.  . NON FORMULARY Diet Type:  Liberalized due to poor appetite to Mech soft  . Nutritional Supplements (NUTRITIONAL SUPPLEMENT PO) Take 1 each by mouth 2 (two) times a day. Magic Cup - take with lunch and dinner  . ondansetron (ZOFRAN) 4 MG tablet Take 4 mg by mouth every 4 (four) hours as needed for nausea.   Marland Kitchen oxybutynin (DITROPAN) 5 MG tablet Take 5 mg by mouth 2 (two) times daily.   .  polyethylene glycol (MIRALAX / GLYCOLAX) packet Take 17 g by mouth daily as needed for moderate constipation.  . predniSONE (DELTASONE) 1 MG tablet Take 1 mg by mouth daily with breakfast.   . rOPINIRole (REQUIP) 1 MG tablet Take 1 mg by mouth See admin instructions. 1mg  at noon, 1mg  2 hours before bed, then 1mg  at bedtime  . senna-docusate (SENOKOT-S) 8.6-50 MG tablet Take 1 tablet by mouth 2 (two) times daily.  Marland Kitchen SILENOR 6 MG TABS Take 0.5 tablets (3 mg total) by mouth at bedtime.  . traMADol (ULTRAM) 50 MG tablet Take 1 tablet (50 mg total) by mouth 2 (two) times daily.  . vitamin C (ASCORBIC ACID) 500 MG tablet Take 500 mg by mouth daily.  Marland Kitchen zinc sulfate 220 (  50 Zn) MG capsule Take 220 mg by mouth every other day.  . [DISCONTINUED] warfarin (COUMADIN) 2 MG tablet Take 2 mg by mouth See admin instructions. Thursday, Saturday, Sunday  . [DISCONTINUED] warfarin (COUMADIN) 3 MG tablet Take 1.5 mg by mouth See admin instructions. Monday, Tues, Wednesday, Friday   No facility-administered encounter medications on file as of 11/19/2019.    Review of Systems  This is somewhat limited secondary to patient being a poor historian.  In general no complaints of fever or chills.  Skin does not complain of rashes itching no increased bruising or overt bleeding noted.  Head ears eyes nose mouth and throat is not complaining of visual changes or sore throat.  Respiratory does not complain of being short of breath or having a cough.  Cardiac no complaints of chest pain has baseline fairly mild lower extremity edema.  GI is not complaining of abdominal discomfort nausea vomiting diarrhea constipation  Some bleeding was noted with a recent bowel movement.  No further episodes.  GU does not complain of dysuria no hematuria has been noted.  Musculoskeletal does not complain of joint pain currently.  Neurologic she does not complain of having a headache or dizziness.  Psych does not complain of  being depressed or anxious appears to be at baseline does have some element of dementia  Immunization History  Administered Date(s) Administered  . Influenza-Unspecified 08/11/2017, 07/02/2018, 08/02/2019  . Moderna SARS-COVID-2 Vaccination 11/01/2019  . PPD Test 03/18/2017  . Pneumococcal Conjugate-13 09/17/2017  . Pneumococcal Polysaccharide-23 04/17/2019   Pertinent  Health Maintenance Due  Topic Date Due  . INFLUENZA VACCINE  Completed  . DEXA SCAN  Completed  . PNA vac Low Risk Adult  Completed   Fall Risk  03/14/2018  Falls in the past year? No   Functional Status Survey:    Vitals:   11/19/19 1608  BP: (!) 117/52  Pulse: (!) 56  Resp: 17  Temp: (!) 96.4 F (35.8 C)  TempSrc: Oral  SpO2: 97%  Weight: 131 lb 3.2 oz (59.5 kg)  Height: 5\' 1"  (1.549 m)   Body mass index is 24.79 kg/m. Physical Exam   In general this is a pleasant elderly female in no distress lying comfortably in bed.  Her skin is warm and dry do not really note any evidence of bleeding or increased bruising.  Eyes visual acuity appears to be intact sclera and conjunctive are clear.  Oropharynx is clear.  Chest is clear to auscultation-she had a minimal wheeze anterior field that cleared quickly with cough.  Heart is largely regular rate and rhythm with some irregular beats pulse is in the low 60s on auscultation.  Abdomen is soft nontender with positive bowel sounds.  Rectal exam could not appreciate any sign of bleeding per digital exam she appears to have a small hemorrhoid but is not really bleeding at this time.  GU could not appreciate any bleeding from the vaginal area or discharge.  Musculoskeletal appears able to move all extremities at baseline with lower extremity weakness this is a limited exam since she is in bed.  Neurologic is grossly intact cannot really appreciate lateralizing findings her speech is clear.  Psych she is pleasant appropriate follow simple verbal  commands.    Labs reviewed:     Recent Labs    12/14/18 0536 12/14/18 0536 12/15/18 0911 12/15/18 0911 12/16/18 [id number] 12/16/18 [id number] 12/17/18 0417 12/17/18 0417 12/27/18 0000 06/12/19 0000 09/20/19 0000  NA 138   < >  138   < > 136   < > 135  --  142 141 137  K 3.9   < > 3.4*   < > 4.0   < > 4.1   < > 3.6 4.3 4.8  CL 110   < > 108   < > 108  --  105  --   --   --  106  CO2 22   < > 23   < > 22  --  22  --   --   --  21  GLUCOSE 144*   < > 85  --  96  --  104*  --   --   --   --   BUN 25*   < > 25*   < > 21   < > 20  --  32* 25* 22*  CREATININE 1.13*   < > 0.81   < > 0.86   < > 0.99  --  0.9 0.8 0.9  CALCIUM 8.3*   < > 7.8*   < > 7.8*  --  7.7*  --   --   --  8.5*  MG 2.1  --  2.3  --  2.2  --   --   --   --   --   --    < > = values in this interval not displayed.   Recent Labs    06/12/19 0000  AST 14  ALT 12  ALKPHOS 49   Recent Labs    12/16/18 0611 12/16/18 0611 12/17/18 0417 12/17/18 0417 12/18/18 0408 12/18/18 0408 12/27/18 0000 06/12/19 0000 09/20/19 0000  WBC 7.4   < > 7.3   < > 8.2  --  12.2 7.0 7.0  NEUTROABS  --   --  4.7   < > 5.3  --   --  3 3  HGB 7.3*   < > 9.4*   < > 9.5*   < > 9.0* 11.1* 10.9*  HCT 24.5*   < > 31.3*   < > 31.4*   < > 28* 35* 34*  MCV 83.3  --  83.2  --  83.3  --   --   --   --   PLT 282   < > 265   < > 257   < > 404* 279 303   < > = values in this interval not displayed.   Lab Results  Component Value Date   TSH 1.71 08/01/2018   Lab Results  Component Value Date   HGBA1C 5.7 08/01/2018   Lab Results  Component Value Date   CHOL 140 08/01/2018   HDL 46 08/01/2018   LDLCALC 80 08/01/2018   TRIG 82 08/01/2018   CHOLHDL 2.4 03/03/2012    Significant Diagnostic Results in last 30 days:  No results found.  Assessment/Plan  #1 1 episode of rectal bleeding-this apparently was during a very large bowel movement per discussion with her nursing tech.  There have been no further episodes and no evidence of bleeding  currently per exam.  At this point will monitor-- her INR actually is subtherapeutic.  We will check a CBC and metabolic panel tomorrow to ensure stability of her hemoglobin.  And monitor for any further episodes I suspect at this point this is more trauma related from the large bowel movement.  2.  Anticoagulation management with history of pulmonary embolism and DVT INR is slightly subtherapeutic will increase her Coumadin to 2  mg a day currently she is on 1.5 mg Monday Tuesday Wednesday and Friday and 2 mg Thursday Saturday and Sunday.  We will update an INR on Friday to keep an eye on this.  [id number].  Of note greater than 25 minutes spent assessing patient-discussing her status with nursing staff-reviewing her chart and labs-coordinating a plan of care-of note greater than 50% of time spent coordinating a plan of care with input as noted above

## 2019-11-20 ENCOUNTER — Non-Acute Institutional Stay (SKILLED_NURSING_FACILITY): Payer: Medicare Other | Admitting: Internal Medicine

## 2019-11-20 DIAGNOSIS — Z7901 Long term (current) use of anticoagulants: Secondary | ICD-10-CM | POA: Diagnosis not present

## 2019-11-20 DIAGNOSIS — R6889 Other general symptoms and signs: Secondary | ICD-10-CM | POA: Diagnosis not present

## 2019-11-20 DIAGNOSIS — I2699 Other pulmonary embolism without acute cor pulmonale: Secondary | ICD-10-CM | POA: Diagnosis not present

## 2019-11-20 DIAGNOSIS — I8290 Acute embolism and thrombosis of unspecified vein: Secondary | ICD-10-CM | POA: Diagnosis not present

## 2019-11-20 DIAGNOSIS — D649 Anemia, unspecified: Secondary | ICD-10-CM | POA: Diagnosis not present

## 2019-11-20 LAB — BASIC METABOLIC PANEL
BUN: 29 — AB (ref 4–21)
CO2: 22 (ref 13–22)
Chloride: 108 (ref 99–108)
Creatinine: 1 (ref 0.5–1.1)
Glucose: 99
Potassium: 4.2 (ref 3.4–5.3)
Sodium: 142 (ref 137–147)

## 2019-11-20 LAB — COMPREHENSIVE METABOLIC PANEL
Calcium: 9 (ref 8.7–10.7)
GFR calc Af Amer: 54.46
GFR calc non Af Amer: 46.99

## 2019-11-20 LAB — CBC AND DIFFERENTIAL
HCT: 33 — AB (ref 36–46)
Hemoglobin: 10.7 — AB (ref 12.0–16.0)
Neutrophils Absolute: 6
Platelets: 324 (ref 150–399)
WBC: 8.8

## 2019-11-20 LAB — CBC: RBC: 3.81 — AB (ref 3.87–5.11)

## 2019-11-23 ENCOUNTER — Telehealth: Payer: Self-pay | Admitting: Nurse Practitioner

## 2019-11-23 ENCOUNTER — Non-Acute Institutional Stay (SKILLED_NURSING_FACILITY): Payer: Medicare Other | Admitting: Internal Medicine

## 2019-11-23 DIAGNOSIS — Z789 Other specified health status: Secondary | ICD-10-CM | POA: Diagnosis not present

## 2019-11-23 DIAGNOSIS — E559 Vitamin D deficiency, unspecified: Secondary | ICD-10-CM | POA: Diagnosis not present

## 2019-11-23 DIAGNOSIS — E039 Hypothyroidism, unspecified: Secondary | ICD-10-CM | POA: Diagnosis not present

## 2019-11-23 DIAGNOSIS — D649 Anemia, unspecified: Secondary | ICD-10-CM | POA: Diagnosis not present

## 2019-11-23 DIAGNOSIS — E119 Type 2 diabetes mellitus without complications: Secondary | ICD-10-CM | POA: Diagnosis not present

## 2019-11-23 DIAGNOSIS — Z79899 Other long term (current) drug therapy: Secondary | ICD-10-CM | POA: Diagnosis not present

## 2019-11-23 DIAGNOSIS — R0689 Other abnormalities of breathing: Secondary | ICD-10-CM | POA: Diagnosis not present

## 2019-11-23 DIAGNOSIS — R509 Fever, unspecified: Secondary | ICD-10-CM | POA: Diagnosis not present

## 2019-11-23 DIAGNOSIS — N390 Urinary tract infection, site not specified: Secondary | ICD-10-CM | POA: Diagnosis not present

## 2019-11-23 LAB — BASIC METABOLIC PANEL
BUN: 21 (ref 4–21)
CO2: 16 (ref 13–22)
Chloride: 106 (ref 99–108)
Creatinine: 0.9 (ref 0.5–1.1)
Glucose: 100
Potassium: 3.9 (ref 3.4–5.3)
Sodium: 138 (ref 137–147)

## 2019-11-23 LAB — CBC AND DIFFERENTIAL
HCT: 35 — AB (ref 36–46)
Hemoglobin: 11.1 — AB (ref 12.0–16.0)
Neutrophils Absolute: 18
Platelets: 312 (ref 150–399)
WBC: 18.2

## 2019-11-23 LAB — CBC: RBC: 4.05 (ref 3.87–5.11)

## 2019-11-23 LAB — COMPREHENSIVE METABOLIC PANEL
Calcium: 8.8 (ref 8.7–10.7)
GFR calc Af Amer: 66.14
GFR calc non Af Amer: 57.07

## 2019-11-23 NOTE — Telephone Encounter (Signed)
Reported CXR: hazy infiltrate left retrocardiac lung base. Rocephin started by Dr. [name].

## 2019-11-24 ENCOUNTER — Encounter: Payer: Self-pay | Admitting: Internal Medicine

## 2019-11-25 ENCOUNTER — Encounter: Payer: Self-pay | Admitting: Internal Medicine

## 2019-11-25 NOTE — Progress Notes (Signed)
Location:      Place of Service:     [name] Duos, MD  Patient Care Team: [name] Duos, MD as PCP - General (Internal Medicine)  Extended Emergency Contact Information Primary Emergency Contact: [name] [name] Address: [address]          [place], [place] [address] [name] [name] Phone: [phone] Mobile Phone: [phone] Relation: Son Secondary Emergency Contact: [place] of [place] Phone: [phone] Relation: Daughter    Allergies: Ambien [zolpidem tartrate], Codeine, Codeine phosphate, Darvocet [propoxyphene n-acetaminophen], Fish allergy, Fish-derived products, Hydrocodone, Promethazine, Promethazine hcl, Sulfamethoxazole, Vicodin [hydrocodone-acetaminophen], Amoxicillin, and Penicillins  Chief Complaint  Patient presents with  . Acute Visit    HPI: Patient is 86 y.o. female who is being seen for INR check.  Patient has chronic DVT/PE.  Patient's INR today is 1.8 which is not therapeutic.  Patient has no complaint.  Past Medical History:  Diagnosis Date  . Anemia 03/02/2012  . Angina pectoris, unstable ([place]) 03/02/2012  . Anxiety   . Axillary abscess   . Bradycardia 06/13/2012  . CAD (coronary artery disease)    S/p PTCA / stenting (last cath 2004, multiple LAD stents, 2 stents in the right coronary artery all patent)  . Cancer ([place])   . Complication of anesthesia   . Compression fracture of L2 ([place]) 03/12/2017  . Dementia ([place])   . Dysphagia 10/06/2012  . Dyspnea 02/03/2012  . GERD (gastroesophageal reflux disease)   . History of pulmonary embolus (PE) 03/19/2017  . HTN (hypertension)   . PMR (polymyalgia rheumatica) (HCC)   . PONV (postoperative nausea and vomiting)   . Restless leg syndrome   . 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 Started coumadin  . Weakness of both legs 06/09/2012    Past Surgical History:  Procedure Laterality Date  . BREAST LUMPECTOMY    . COLECTOMY    .  ESOPHAGOGASTRODUODENOSCOPY (EGD) WITH ESOPHAGEAL DILATION  10/10/2012   Procedure: ESOPHAGOGASTRODUODENOSCOPY (EGD) WITH ESOPHAGEAL DILATION;  Surgeon: Inda Castle, MD;  Location: [facility];  Service: Endoscopy;  Laterality: N/A;  . FEMUR IM NAIL Left 12/13/2018   Procedure: INTRAMEDULLARY (IM) NAIL FEMORAL LEFT;  Surgeon: Paralee Cancel, MD;  [facility];  Service: Orthopedics;  Laterality: Left;  . heart stents  x 8  . INCISION AND DRAINAGE     bilateral axillary, non specific staff  . INCISION AND DRAINAGE ABSCESS  09/28/2012   Procedure: INCISION AND DRAINAGE ABSCESS;  Surgeon: Zenovia Jarred, MD;  Location: [facility];  Service: General;  Laterality: Bilateral;  . stent     cardiac x 8 stents.    Allergies as of 11/20/2019      Reactions   Ambien [zolpidem Tartrate] Nausea And Vomiting   Codeine    Codeine Phosphate Nausea And Vomiting   Darvocet [propoxyphene N-acetaminophen] Nausea And Vomiting   Fish Allergy    Fish-derived Products    Hydrocodone    Promethazine    Promethazine Hcl Nausea And Vomiting   Sulfamethoxazole Other (See Comments)   Pt doesn't remember reaction   Vicodin [hydrocodone-acetaminophen] Other (See Comments)   Unknown reaction   Amoxicillin Nausea And Vomiting, Rash   Penicillins Nausea And Vomiting, Rash      Medication List       Accurate as of November 20, 2019 11:59 PM. If you have any questions, ask your nurse or doctor.        acetaminophen 325  MG tablet Commonly known as: TYLENOL Take 650 mg by mouth every 6 (six) hours as needed for mild pain, moderate pain or fever.   acetaminophen 325 MG tablet Commonly known as: TYLENOL Take 650 mg by mouth every 6 (six) hours. Take routinely for left hip pain   ALPRAZolam 0.25 MG tablet Commonly known as: XANAX Take 0.25 mg by mouth daily.   amLODipine 10 MG tablet Commonly known as: NORVASC Take 10 mg by mouth every morning.   aspirin EC 81 MG tablet Take 1 tablet (81 mg total) by  mouth daily.   atorvastatin 10 MG tablet Commonly known as: LIPITOR Take 10 mg by mouth every evening.   donepezil 10 MG tablet Commonly known as: ARICEPT Take 10 mg by mouth every evening.   losartan 50 MG tablet Commonly known as: COZAAR Take 50 mg by mouth at bedtime.   nitroGLYCERIN 0.4 MG SL tablet Commonly known as: NITROSTAT Place 0.4 mg under the tongue every 5 (five) minutes as needed for chest pain.   NON FORMULARY Diet Type:  Liberalized due to poor appetite to Mech soft   NUTRITIONAL SUPPLEMENT PO Take 1 each by mouth 2 (two) times a day. Magic Cup - take with lunch and dinner   ondansetron 4 MG tablet Commonly known as: ZOFRAN Take 4 mg by mouth every 4 (four) hours as needed for nausea.   oxybutynin 5 MG tablet Commonly known as: DITROPAN Take 5 mg by mouth 2 (two) times daily.   polyethylene glycol 17 g packet Commonly known as: MIRALAX / GLYCOLAX Take 17 g by mouth daily as needed for moderate constipation.   predniSONE 1 MG tablet Commonly known as: DELTASONE Take 1 mg by mouth daily with breakfast.   ProAir HFA 108 (90 Base) MCG/ACT inhaler Generic drug: albuterol Inhale 2 puffs into the lungs every 6 (six) hours as needed for wheezing or shortness of breath.   rOPINIRole 1 MG tablet Commonly known as: REQUIP Take 1 mg by mouth See admin instructions. 1mg  at noon, 1mg  2 hours before bed, then 1mg  at bedtime   senna-docusate 8.6-50 MG tablet Commonly known as: Senokot-S Take 1 tablet by mouth 2 (two) times daily.   Silenor 6 MG Tabs Generic drug: Doxepin HCl Take 0.5 tablets (3 mg total) by mouth at bedtime.   traMADol 50 MG tablet Commonly known as: ULTRAM Take 1 tablet (50 mg total) by mouth 2 (two) times daily.   Tums 500 MG chewable tablet Generic drug: calcium carbonate Chew 1 tablet by mouth 3 (three) times daily as needed for indigestion or heartburn.   vitamin C 500 MG tablet Commonly known as: ASCORBIC ACID Take 500 mg by  mouth daily.   Vitamin D3 1.25 MG (50000 UT) Caps Take 1 capsule by mouth once a week.   warfarin 2 MG tablet Commonly known as: COUMADIN Take 2 mg by mouth daily.   zinc sulfate 220 (50 Zn) MG capsule Take 220 mg by mouth every other day.       No orders of the defined types were placed in this encounter.   Immunization History  Administered Date(s) Administered  . Influenza-Unspecified 08/11/2017, 07/02/2018, 08/02/2019  . Moderna SARS-COVID-2 Vaccination 11/01/2019  . PPD Test 03/18/2017  . Pneumococcal Conjugate-13 09/17/2017  . Pneumococcal Polysaccharide-23 04/17/2019    Social History   Tobacco Use  . Smoking status: Never Smoker  . Smokeless tobacco: Never Used  Substance Use Topics  . Alcohol use: No  Vitals:   11/25/19 2227  BP: 131/75  Pulse: 75  Resp: 17  Temp: (!) 96.6 F (35.9 C)   There is no height or weight on file to calculate BMI.   Patient Active Problem List   Diagnosis Date Noted  . Failure to thrive in adult 01/08/2019  . Moderate protein malnutrition ([place]) 01/08/2019  . Pressure injury of skin 12/18/2018  . Intertrochanteric fracture of left femur ([place]) 12/11/2018  . CKD (chronic kidney disease), stage III 12/11/2018  . Depression with anxiety 12/11/2018  . Overactive bladder 09/24/2017  . Insomnia 07/31/2017  . Anxiety 06/16/2017  . Citrobacter infection 03/19/2017  . AKI (acute kidney injury) ([place]) 03/19/2017  . History of pulmonary embolus (PE) 03/19/2017  . Elevated INR 03/19/2017  . Candidal intertrigo 03/19/2017  . Compression fracture of L2 ([place]) 03/12/2017  . Acute lower UTI 03/12/2017  . Accelerated hypertension 03/12/2017  . Leukocytosis 07/31/2015  . Acute bronchitis 07/28/2015  . Chronic anticoagulation 07/28/2015  . Intractable back pain 07/20/2015  . Back pain 07/20/2015  . Cellulitis and abscess of left buttock 04/12/2013  . Stricture and stenosis of esophagus 10/10/2012  . Dementia ([place]) 10/06/2012    . Dysphagia 10/06/2012  . Axillary abscess - bilateral, multiple 09/27/2012  . Hypotension 08/24/2012  . Near syncope 08/24/2012  . Abscess of axilla, left 06/27/2012  . Bradycardia 06/13/2012  . Abscess 06/11/2012  . Chest pain 06/09/2012  . Cellulitis 06/09/2012  . Weakness of both legs 06/09/2012  . PMR (polymyalgia rheumatica) (HCC)   . Restless leg syndrome   . Angina pectoris, unstable ([place]) 03/02/2012  . Acute blood loss as cause of postoperative anemia 03/02/2012  . Dyspnea 02/03/2012  . UTI (urinary tract infection) 02/03/2012  . CAD (coronary artery disease)   . HTN (hypertension)   . VTE (venous thromboembolism) 10/01/2010  . HYPERCHOLESTEROLEMIA  IIA 08/27/2009  . Hyperlipidemia 12/11/2008  . HYPERTENSION, BENIGN 12/11/2008    CMP     Component Value Date/Time   NA 137 09/20/2019 0000   K 4.8 09/20/2019 0000   CL 106 09/20/2019 0000   CO2 21 09/20/2019 0000   GLUCOSE 104 (H) 12/17/2018 0417   BUN 22 (A) 09/20/2019 0000   CREATININE 0.9 09/20/2019 0000   CREATININE 0.99 12/17/2018 0417   CALCIUM 8.5 (A) 09/20/2019 0000   PROT 6.7 03/11/2017 2031   ALBUMIN 3.4 (L) 03/11/2017 2031   AST 14 06/12/2019 0000   ALT 12 06/12/2019 0000   ALKPHOS 49 06/12/2019 0000   BILITOT 0.5 03/11/2017 2031   GFRNONAA 56.34 09/20/2019 0000   GFRAA 65.30 09/20/2019 0000   Recent Labs    12/14/18 0536 12/14/18 0536 12/15/18 0911 12/15/18 0911 12/16/18 [id number] 12/16/18 [id number] 12/17/18 0417 12/17/18 0417 12/27/18 0000 06/12/19 0000 09/20/19 0000  NA 138   < > 138   < > 136   < > 135  --  142 141 137  K 3.9   < > 3.4*   < > 4.0   < > 4.1   < > 3.6 4.3 4.8  CL 110   < > 108   < > 108  --  105  --   --   --  106  CO2 22   < > 23   < > 22  --  22  --   --   --  21  GLUCOSE 144*   < > 85  --  96  --  104*  --   --   --   --  BUN 25*   < > 25*   < > 21   < > 20  --  32* 25* 22*  CREATININE 1.13*   < > 0.81   < > 0.86   < > 0.99  --  0.9 0.8 0.9  CALCIUM 8.3*   < > 7.8*   < >  7.8*  --  7.7*  --   --   --  8.5*  MG 2.1  --  2.3  --  2.2  --   --   --   --   --   --    < > = values in this interval not displayed.   Recent Labs    06/12/19 0000  AST 14  ALT 12  ALKPHOS 49   Recent Labs    12/16/18 0611 12/16/18 0611 12/17/18 0417 12/17/18 0417 12/18/18 0408 12/18/18 0408 12/27/18 0000 06/12/19 0000 09/20/19 0000  WBC 7.4   < > 7.3   < > 8.2  --  12.2 7.0 7.0  NEUTROABS  --   --  4.7   < > 5.3  --   --  3 3  HGB 7.3*   < > 9.4*   < > 9.5*   < > 9.0* 11.1* 10.9*  HCT 24.5*   < > 31.3*   < > 31.4*   < > 28* 35* 34*  MCV 83.3  --  83.2  --  83.3  --   --   --   --   PLT 282   < > 265   < > 257   < > 404* 279 303   < > = values in this interval not displayed.   No results for input(s): CHOL, LDLCALC, TRIG in the last 8760 hours.  Invalid input(s): HCL No results found for: [facility] Lab Results  Component Value Date   TSH 1.71 08/01/2018   Lab Results  Component Value Date   HGBA1C 5.7 08/01/2018   Lab Results  Component Value Date   CHOL 140 08/01/2018   HDL 46 08/01/2018   LDLCALC 80 08/01/2018   TRIG 82 08/01/2018   CHOLHDL 2.4 03/03/2012    Significant Diagnostic Results in last 30 days:  No results found.  Assessment and Plan  Chronic DVT/anticoagulated on Coumadin-patient's INR is 1.8 on 1.5 mg of Coumadin Monday Tuesday Wednesday Friday and 2 mg on Thursday Saturday and Sunday.  Changed to 2 mg on Tuesday Thursday Saturday Sunday and the 1.5 mg on every 3 days with INR in 1 week.    [name] [name] , MD

## 2019-11-26 DIAGNOSIS — E119 Type 2 diabetes mellitus without complications: Secondary | ICD-10-CM | POA: Diagnosis not present

## 2019-11-26 DIAGNOSIS — E559 Vitamin D deficiency, unspecified: Secondary | ICD-10-CM | POA: Diagnosis not present

## 2019-11-26 DIAGNOSIS — D649 Anemia, unspecified: Secondary | ICD-10-CM | POA: Diagnosis not present

## 2019-11-26 LAB — BASIC METABOLIC PANEL
BUN: 23 — AB (ref 4–21)
CO2: 18 (ref 13–22)
Chloride: 103 (ref 99–108)
Creatinine: 1 (ref 0.5–1.1)
Glucose: 102
Potassium: 5.3 (ref 3.4–5.3)
Sodium: 136 — AB (ref 137–147)

## 2019-11-26 LAB — COMPREHENSIVE METABOLIC PANEL
Calcium: 8.8 (ref 8.7–10.7)
GFR calc Af Amer: 56.48
GFR calc non Af Amer: 48.73

## 2019-11-26 LAB — CBC AND DIFFERENTIAL
HCT: 29 — AB (ref 36–46)
Hemoglobin: 9.4 — AB (ref 12.0–16.0)
Neutrophils Absolute: 12
Platelets: 224 (ref 150–399)
WBC: 14.7

## 2019-11-26 LAB — CBC: RBC: 3.38 — AB (ref 3.87–5.11)

## 2019-11-27 ENCOUNTER — Encounter: Payer: Self-pay | Admitting: Internal Medicine

## 2019-11-27 ENCOUNTER — Non-Acute Institutional Stay (SKILLED_NURSING_FACILITY): Payer: Medicare Other | Admitting: Internal Medicine

## 2019-11-27 DIAGNOSIS — Z86711 Personal history of pulmonary embolism: Secondary | ICD-10-CM

## 2019-11-27 DIAGNOSIS — Z7901 Long term (current) use of anticoagulants: Secondary | ICD-10-CM

## 2019-11-27 NOTE — Progress Notes (Signed)
Location:  [facility] [place] [facility] Room Number: [phone] Place of Service:  SNF (31)  Hennie Duos, MD  Patient Care Team: Hennie Duos, MD as PCP - General (Internal Medicine)  Extended Emergency Contact Information Primary Emergency Contact: Helmstetter,[name] Address: [address]          [place], [place] [address] [name] of [place] Phone: [phone] Mobile Phone: [phone] Relation: Son Secondary Emergency Contact: [place] of [place] Phone: [phone] Relation: Daughter    Allergies: Ambien [zolpidem tartrate], Codeine, Codeine phosphate, Darvocet [propoxyphene n-acetaminophen], Fish allergy, Fish-derived products, Hydrocodone, Promethazine, Promethazine hcl, Sulfamethoxazole, Vicodin [hydrocodone-acetaminophen], Amoxicillin, and Penicillins  Chief Complaint  Patient presents with  . Anticoagulation    Inr Check     HPI: Patient is 86 y.o. 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  Today patient's INR is 2.9 which is therapeutic.  Past Medical History:  Diagnosis Date  . Anemia 03/02/2012  . Angina pectoris, unstable ([place]) 03/02/2012  . Anxiety   . Axillary abscess   . Bradycardia 06/13/2012  . CAD (coronary artery disease)    S/p PTCA / stenting (last cath 2004, multiple LAD stents, 2 stents in the right coronary artery all patent)  . Cancer ([place])   . Complication of anesthesia   . Compression fracture of L2 ([place]) 03/12/2017  . Dementia ([place])   . Dysphagia 10/06/2012  . Dyspnea 02/03/2012  . GERD (gastroesophageal reflux disease)   . History of pulmonary embolus (PE) 03/19/2017  . HTN (hypertension)   . PMR (polymyalgia rheumatica) (HCC)   . PONV (postoperative nausea and vomiting)   . Restless leg syndrome   . 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 Started coumadin  . Weakness of both legs 06/09/2012    Past Surgical History:  Procedure Laterality Date   . BREAST LUMPECTOMY    . COLECTOMY    . ESOPHAGOGASTRODUODENOSCOPY (EGD) WITH ESOPHAGEAL DILATION  10/10/2012   Procedure: ESOPHAGOGASTRODUODENOSCOPY (EGD) WITH ESOPHAGEAL DILATION;  Surgeon: Inda Castle, MD;  Location: [facility];  Service: Endoscopy;  Laterality: N/A;  . FEMUR IM NAIL Left 12/13/2018   Procedure: INTRAMEDULLARY (IM) NAIL FEMORAL LEFT;  Surgeon: Paralee Cancel, MD;  [facility];  Service: Orthopedics;  Laterality: Left;  . heart stents  x 8  . INCISION AND DRAINAGE     bilateral axillary, non specific staff  . INCISION AND DRAINAGE ABSCESS  09/28/2012   Procedure: INCISION AND DRAINAGE ABSCESS;  Surgeon: Zenovia Jarred, MD;  Location: [facility];  Service: General;  Laterality: Bilateral;  . stent     cardiac x 8 stents.    Allergies as of 11/27/2019      Reactions   Ambien [zolpidem Tartrate] Nausea And Vomiting   Codeine    Codeine Phosphate Nausea And Vomiting   Darvocet [propoxyphene N-acetaminophen] Nausea And Vomiting   Fish Allergy    Fish-derived Products    Hydrocodone    Promethazine    Promethazine Hcl Nausea And Vomiting   Sulfamethoxazole Other (See Comments)   Pt doesn't remember reaction   Vicodin [hydrocodone-acetaminophen] Other (See Comments)   Unknown reaction   Amoxicillin Nausea And Vomiting, Rash   Penicillins Nausea And Vomiting, Rash      Medication List       Accurate as of November 27, 2019  3:48 PM. If you have any questions, ask your nurse or doctor.  acetaminophen 325 MG tablet Commonly known as: TYLENOL Take 650 mg by mouth every 6 (six) hours as needed for mild pain, moderate pain or fever.   acetaminophen 325 MG tablet Commonly known as: TYLENOL Take 650 mg by mouth every 6 (six) hours. Take routinely for left hip pain   ALPRAZolam 0.25 MG tablet Commonly known as: XANAX Take 0.25 mg by mouth daily.   amLODipine 10 MG tablet Commonly known as: NORVASC Take 10 mg by mouth every morning.   aspirin EC  81 MG tablet Take 1 tablet (81 mg total) by mouth daily.   atorvastatin 10 MG tablet Commonly known as: LIPITOR Take 10 mg by mouth every evening.   donepezil 10 MG tablet Commonly known as: ARICEPT Take 10 mg by mouth every evening.   losartan 50 MG tablet Commonly known as: COZAAR Take 50 mg by mouth at bedtime.   nitroGLYCERIN 0.4 MG SL tablet Commonly known as: NITROSTAT Place 0.4 mg under the tongue every 5 (five) minutes as needed for chest pain.   NON FORMULARY Diet Type:  Liberalized due to poor appetite to Mech soft   NUTRITIONAL SUPPLEMENT PO Take 1 each by mouth 2 (two) times a day. Magic Cup - take with lunch and dinner   ondansetron 4 MG tablet Commonly known as: ZOFRAN Take 4 mg by mouth every 4 (four) hours as needed for nausea.   oxybutynin 5 MG tablet Commonly known as: DITROPAN Take 5 mg by mouth 2 (two) times daily.   polyethylene glycol 17 g packet Commonly known as: MIRALAX / GLYCOLAX Take 17 g by mouth daily as needed for moderate constipation.   predniSONE 1 MG tablet Commonly known as: DELTASONE Take 1 mg by mouth daily with breakfast.   ProAir HFA 108 (90 Base) MCG/ACT inhaler Generic drug: albuterol Inhale 2 puffs into the lungs every 6 (six) hours as needed for wheezing or shortness of breath.   rOPINIRole 1 MG tablet Commonly known as: REQUIP Take 1 mg by mouth See admin instructions. 1mg  at noon, 1mg  2 hours before bed, then 1mg  at bedtime   senna-docusate 8.6-50 MG tablet Commonly known as: Senokot-S Take 1 tablet by mouth 2 (two) times daily.   Silenor 6 MG Tabs Generic drug: Doxepin HCl Take 0.5 tablets (3 mg total) by mouth at bedtime.   traMADol 50 MG tablet Commonly known as: ULTRAM Take 1 tablet (50 mg total) by mouth 2 (two) times daily.   Tums 500 MG chewable tablet Generic drug: calcium carbonate Chew 1 tablet by mouth 3 (three) times daily as needed for indigestion or heartburn.   vitamin C 500 MG  tablet Commonly known as: ASCORBIC ACID Take 500 mg by mouth daily.   Vitamin D3 1.25 MG (50000 UT) Caps Take 1 capsule by mouth once a week.   warfarin 2 MG tablet Commonly known as: COUMADIN Take 2 mg by mouth See admin instructions. On tues thurs Saturday and Sunday   warfarin 3 MG tablet Commonly known as: COUMADIN Take 1.5 mg by mouth See admin instructions. On mond, wed, and Friday   zinc sulfate 220 (50 Zn) MG capsule Take 220 mg by mouth every other day.       No orders of the defined types were placed in this encounter.   Immunization History  Administered Date(s) Administered  . Influenza-Unspecified 08/11/2017, 07/02/2018, 08/02/2019  . Moderna SARS-COVID-2 Vaccination 11/01/2019  . PPD Test 03/18/2017  . Pneumococcal Conjugate-13 09/17/2017  . Pneumococcal Polysaccharide-23 04/17/2019  Social History   Tobacco Use  . Smoking status: Never Smoker  . Smokeless tobacco: Never Used  Substance Use Topics  . Alcohol use: No     Vitals:   11/27/19 1532  BP: 127/72  Pulse: 67  Resp: 18  Temp: 98.1 F (36.7 C)  SpO2: 97%   Body mass index is 24.79 kg/m.   Patient Active Problem List   Diagnosis Date Noted  . Failure to thrive in adult 01/08/2019  . Moderate protein malnutrition ([place]) 01/08/2019  . Pressure injury of skin 12/18/2018  . Intertrochanteric fracture of left femur ([place]) 12/11/2018  . CKD (chronic kidney disease), stage III 12/11/2018  . Depression with anxiety 12/11/2018  . Overactive bladder 09/24/2017  . Insomnia 07/31/2017  . Anxiety 06/16/2017  . Citrobacter infection 03/19/2017  . AKI (acute kidney injury) ([place]) 03/19/2017  . History of pulmonary embolus (PE) 03/19/2017  . Elevated INR 03/19/2017  . Candidal intertrigo 03/19/2017  . Compression fracture of L2 ([place]) 03/12/2017  . Acute lower UTI 03/12/2017  . Accelerated hypertension 03/12/2017  . Leukocytosis 07/31/2015  . Acute bronchitis 07/28/2015  . Chronic  anticoagulation 07/28/2015  . Intractable back pain 07/20/2015  . Back pain 07/20/2015  . Cellulitis and abscess of left buttock 04/12/2013  . Stricture and stenosis of esophagus 10/10/2012  . Dementia ([place]) 10/06/2012  . Dysphagia 10/06/2012  . Axillary abscess - bilateral, multiple 09/27/2012  . Hypotension 08/24/2012  . Near syncope 08/24/2012  . Abscess of axilla, left 06/27/2012  . Bradycardia 06/13/2012  . Abscess 06/11/2012  . Chest pain 06/09/2012  . Cellulitis 06/09/2012  . Weakness of both legs 06/09/2012  . PMR (polymyalgia rheumatica) (HCC)   . Restless leg syndrome   . Angina pectoris, unstable ([place]) 03/02/2012  . Acute blood loss as cause of postoperative anemia 03/02/2012  . Dyspnea 02/03/2012  . UTI (urinary tract infection) 02/03/2012  . CAD (coronary artery disease)   . HTN (hypertension)   . VTE (venous thromboembolism) 10/01/2010  . HYPERCHOLESTEROLEMIA  IIA 08/27/2009  . Hyperlipidemia 12/11/2008  . HYPERTENSION, BENIGN 12/11/2008    CMP     Component Value Date/Time   NA 137 09/20/2019 0000   K 4.8 09/20/2019 0000   CL 106 09/20/2019 0000   CO2 21 09/20/2019 0000   GLUCOSE 104 (H) 12/17/2018 0417   BUN 22 (A) 09/20/2019 0000   CREATININE 0.9 09/20/2019 0000   CREATININE 0.99 12/17/2018 0417   CALCIUM 8.5 (A) 09/20/2019 0000   PROT 6.7 03/11/2017 2031   ALBUMIN 3.4 (L) 03/11/2017 2031   AST 14 06/12/2019 0000   ALT 12 06/12/2019 0000   ALKPHOS 49 06/12/2019 0000   BILITOT 0.5 03/11/2017 2031   GFRNONAA 56.34 09/20/2019 0000   GFRAA 65.30 09/20/2019 0000   Recent Labs    12/14/18 0536 12/14/18 0536 12/15/18 0911 12/15/18 0911 12/16/18 [id number] 12/16/18 [id number] 12/17/18 0417 12/17/18 0417 12/27/18 0000 06/12/19 0000 09/20/19 0000  NA 138   < > 138   < > 136   < > 135  --  142 141 137  K 3.9   < > 3.4*   < > 4.0   < > 4.1   < > 3.6 4.3 4.8  CL 110   < > 108   < > 108  --  105  --   --   --  106  CO2 22   < > 23   < > 22  --  22  --   --    --  21  GLUCOSE 144*   < > 85  --  96  --  104*  --   --   --   --   BUN 25*   < > 25*   < > 21   < > 20  --  32* 25* 22*  CREATININE 1.13*   < > 0.81   < > 0.86   < > 0.99  --  0.9 0.8 0.9  CALCIUM 8.3*   < > 7.8*   < > 7.8*  --  7.7*  --   --   --  8.5*  MG 2.1  --  2.3  --  2.2  --   --   --   --   --   --    < > = values in this interval not displayed.   Recent Labs    06/12/19 0000  AST 14  ALT 12  ALKPHOS 49   Recent Labs    12/16/18 0611 12/16/18 0611 12/17/18 0417 12/17/18 0417 12/18/18 0408 12/18/18 0408 12/27/18 0000 06/12/19 0000 09/20/19 0000  WBC 7.4   < > 7.3   < > 8.2  --  12.2 7.0 7.0  NEUTROABS  --   --  4.7   < > 5.3  --   --  3 3  HGB 7.3*   < > 9.4*   < > 9.5*   < > 9.0* 11.1* 10.9*  HCT 24.5*   < > 31.3*   < > 31.4*   < > 28* 35* 34*  MCV 83.3  --  83.2  --  83.3  --   --   --   --   PLT 282   < > 265   < > 257   < > 404* 279 303   < > = values in this interval not displayed.   No results for input(s): CHOL, LDLCALC, TRIG in the last 8760 hours.  Invalid input(s): HCL No results found for: [facility] Lab Results  Component Value Date   TSH 1.71 08/01/2018   Lab Results  Component Value Date   HGBA1C 5.7 08/01/2018   Lab Results  Component Value Date   CHOL 140 08/01/2018   HDL 46 08/01/2018   LDLCALC 80 08/01/2018   TRIG 82 08/01/2018   CHOLHDL 2.4 03/03/2012    Significant Diagnostic Results in last 30 days:  No results found.  Assessment and Plan  DVT/PE/anticoagulated on Coumadin-patient's INR today is 2.9 on 2 mg Tuesday Thursday Saturday Sunday and 1.5 mg on Monday Wednesday Friday.  We will continue same regimen and repeat in 1 week    Hennie Duos , MD

## 2019-12-02 ENCOUNTER — Encounter: Payer: Self-pay | Admitting: Internal Medicine

## 2019-12-02 NOTE — Progress Notes (Signed)
Location:   [facility] Service:   SNF  Hennie Duos, MD  Patient Care Team: Hennie Duos, MD as PCP - General (Internal Medicine)  Extended Emergency Contact Information Primary Emergency Contact: Mehaffey,[name] Address: [address]          [place], [place] [address] [name] of [place] Phone: [phone] Mobile Phone: [phone] Relation: Son Secondary Emergency Contact: [place] of [place] Phone: [phone] Relation: Daughter    Allergies: Ambien [zolpidem tartrate], Codeine, Codeine phosphate, Darvocet [propoxyphene n-acetaminophen], Fish allergy, Fish-derived products, Hydrocodone, Promethazine, Promethazine hcl, Sulfamethoxazole, Vicodin [hydrocodone-acetaminophen], Amoxicillin, and Penicillins  Chief Complaint  Patient presents with  . Acute Visit    HPI: Patient is 86 y.o. female who nursing asked me to see OB/GYN because she has had some fever and chills.  Patient has not any cough cold, urinary symptoms chest pain shortness of breath nausea vomiting diarrhea.  I asked for the nurses to do a point-of-care Covid test on him immediately and it was negative  Past Medical History:  Diagnosis Date  . Anemia 03/02/2012  . Angina pectoris, unstable ([place]) 03/02/2012  . Anxiety   . Axillary abscess   . Bradycardia 06/13/2012  . CAD (coronary artery disease)    S/p PTCA / stenting (last cath 2004, multiple LAD stents, 2 stents in the right coronary artery all patent)  . Cancer ([place])   . Complication of anesthesia   . Compression fracture of L2 ([place]) 03/12/2017  . Dementia ([place])   . Dysphagia 10/06/2012  . Dyspnea 02/03/2012  . GERD (gastroesophageal reflux disease)   . History of pulmonary embolus (PE) 03/19/2017  . HTN (hypertension)   . PMR (polymyalgia rheumatica) (HCC)   . PONV (postoperative nausea and vomiting)   . Restless leg syndrome   . 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 Started  coumadin  . Weakness of both legs 06/09/2012    Past Surgical History:  Procedure Laterality Date  . BREAST LUMPECTOMY    . COLECTOMY    . ESOPHAGOGASTRODUODENOSCOPY (EGD) WITH ESOPHAGEAL DILATION  10/10/2012   Procedure: ESOPHAGOGASTRODUODENOSCOPY (EGD) WITH ESOPHAGEAL DILATION;  Surgeon: Inda Castle, MD;  Location: [facility];  Service: Endoscopy;  Laterality: N/A;  . FEMUR IM NAIL Left 12/13/2018   Procedure: INTRAMEDULLARY (IM) NAIL FEMORAL LEFT;  Surgeon: Paralee Cancel, MD;  [facility];  Service: Orthopedics;  Laterality: Left;  . heart stents  x 8  . INCISION AND DRAINAGE     bilateral axillary, non specific staff  . INCISION AND DRAINAGE ABSCESS  09/28/2012   Procedure: INCISION AND DRAINAGE ABSCESS;  Surgeon: Zenovia Jarred, MD;  Location: [facility];  Service: General;  Laterality: Bilateral;  . stent     cardiac x 8 stents.    Allergies as of 11/23/2019      Reactions   Ambien [zolpidem Tartrate] Nausea And Vomiting   Codeine    Codeine Phosphate Nausea And Vomiting   Darvocet [propoxyphene N-acetaminophen] Nausea And Vomiting   Fish Allergy    Fish-derived Products    Hydrocodone    Promethazine    Promethazine Hcl Nausea And Vomiting   Sulfamethoxazole Other (See Comments)   Pt doesn't remember reaction   Vicodin [hydrocodone-acetaminophen] Other (See Comments)   Unknown reaction   Amoxicillin Nausea And Vomiting, Rash   Penicillins Nausea And Vomiting, Rash      Medication List       Accurate as  of November 23, 2019 11:59 PM. If you have any questions, ask your nurse or doctor.        acetaminophen 325 MG tablet Commonly known as: TYLENOL Take 650 mg by mouth every 6 (six) hours as needed for mild pain, moderate pain or fever.   acetaminophen 325 MG tablet Commonly known as: TYLENOL Take 650 mg by mouth every 6 (six) hours. Take routinely for left hip pain   ALPRAZolam 0.25 MG tablet Commonly known as: XANAX Take 0.25 mg by mouth daily.     amLODipine 10 MG tablet Commonly known as: NORVASC Take 10 mg by mouth every morning.   aspirin EC 81 MG tablet Take 1 tablet (81 mg total) by mouth daily.   atorvastatin 10 MG tablet Commonly known as: LIPITOR Take 10 mg by mouth every evening.   donepezil 10 MG tablet Commonly known as: ARICEPT Take 10 mg by mouth every evening.   losartan 50 MG tablet Commonly known as: COZAAR Take 50 mg by mouth at bedtime.   nitroGLYCERIN 0.4 MG SL tablet Commonly known as: NITROSTAT Place 0.4 mg under the tongue every 5 (five) minutes as needed for chest pain.   NON FORMULARY Diet Type:  Liberalized due to poor appetite to Mech soft   NUTRITIONAL SUPPLEMENT PO Take 1 each by mouth 2 (two) times a day. Magic Cup - take with lunch and dinner   ondansetron 4 MG tablet Commonly known as: ZOFRAN Take 4 mg by mouth every 4 (four) hours as needed for nausea.   oxybutynin 5 MG tablet Commonly known as: DITROPAN Take 5 mg by mouth 2 (two) times daily.   polyethylene glycol 17 g packet Commonly known as: MIRALAX / GLYCOLAX Take 17 g by mouth daily as needed for moderate constipation.   predniSONE 1 MG tablet Commonly known as: DELTASONE Take 1 mg by mouth daily with breakfast.   ProAir HFA 108 (90 Base) MCG/ACT inhaler Generic drug: albuterol Inhale 2 puffs into the lungs every 6 (six) hours as needed for wheezing or shortness of breath.   rOPINIRole 1 MG tablet Commonly known as: REQUIP Take 1 mg by mouth See admin instructions. 1mg  at noon, 1mg  2 hours before bed, then 1mg  at bedtime   senna-docusate 8.6-50 MG tablet Commonly known as: Senokot-S Take 1 tablet by mouth 2 (two) times daily.   Silenor 6 MG Tabs Generic drug: Doxepin HCl Take 0.5 tablets (3 mg total) by mouth at bedtime.   traMADol 50 MG tablet Commonly known as: ULTRAM Take 1 tablet (50 mg total) by mouth 2 (two) times daily.   Tums 500 MG chewable tablet Generic drug: calcium carbonate Chew 1 tablet by  mouth 3 (three) times daily as needed for indigestion or heartburn.   vitamin C 500 MG tablet Commonly known as: ASCORBIC ACID Take 500 mg by mouth daily.   Vitamin D3 1.25 MG (50000 UT) Caps Take 1 capsule by mouth once a week.   warfarin 2 MG tablet Commonly known as: COUMADIN Take 2 mg by mouth See admin instructions. On tues thurs Saturday and Sunday   zinc sulfate 220 (50 Zn) MG capsule Take 220 mg by mouth every other day.       No orders of the defined types were placed in this encounter.   Immunization History  Administered Date(s) Administered  . Influenza-Unspecified 08/11/2017, 07/02/2018, 08/02/2019  . Moderna SARS-COVID-2 Vaccination 11/01/2019  . PPD Test 03/18/2017  . Pneumococcal Conjugate-13 09/17/2017  . Pneumococcal Polysaccharide-23 04/17/2019  Social History   Tobacco Use  . Smoking status: Never Smoker  . Smokeless tobacco: Never Used  Substance Use Topics  . Alcohol use: No    Review of Systems  GENERAL:  no fevers, fatigue, appetite changes SKIN: No itching, rash HEENT: No complaint RESPIRATORY: No cough, wheezing, SOB CARDIAC: No chest pain, palpitations, lower extremity edema  GI: No abdominal pain, No N/V/D or constipation, No heartburn or reflux  GU: No dysuria, frequency or urgency, has had or incontinence  MUSCULOSKELETAL: No unrelieved bone/joint pain NEUROLOGIC: No headache, dizziness  PSYCHIATRIC: No overt anxiety or sadness  Vitals:   12/02/19 1351  BP: (!) 168/72  Pulse: 72  Resp: 17  Temp: (!) 97.2 F (36.2 C)   There is no height or weight on file to calculate BMI. Physical Exam  GENERAL APPEARANCE: Alert, conversant, looks like she feels bad but not toxic SKIN: No diaphoresis rash HEENT: Unremarkable RESPIRATORY: Breathing is even, unlabored. Lung sounds are slightly decreased respiratory on the right; O2 sat at the bedside is 92 to 92% on room air with a pulse of 83 CARDIOVASCULAR: Heart RRR no murmurs, rubs  or gallops. No peripheral edema  GASTROINTESTINAL: Abdomen is soft, non-tender, not distended w/ normal bowel sounds.  GENITOURINARY: Bladder non tender, not distended  MUSCULOSKELETAL: No abnormal joints or musculature NEUROLOGIC: Cranial nerves 2-12 grossly intact. Moves all extremities PSYCHIATRIC: Mood and affect appropriate to situation, no behavioral issues  Patient Active Problem List   Diagnosis Date Noted  . Failure to thrive in adult 01/08/2019  . Moderate protein malnutrition ([place]) 01/08/2019  . Pressure injury of skin 12/18/2018  . Intertrochanteric fracture of left femur ([place]) 12/11/2018  . CKD (chronic kidney disease), stage III 12/11/2018  . Depression with anxiety 12/11/2018  . Overactive bladder 09/24/2017  . Insomnia 07/31/2017  . Anxiety 06/16/2017  . Citrobacter infection 03/19/2017  . AKI (acute kidney injury) ([place]) 03/19/2017  . History of pulmonary embolus (PE) 03/19/2017  . Elevated INR 03/19/2017  . Candidal intertrigo 03/19/2017  . Compression fracture of L2 ([place]) 03/12/2017  . Acute lower UTI 03/12/2017  . Accelerated hypertension 03/12/2017  . Leukocytosis 07/31/2015  . Acute bronchitis 07/28/2015  . Chronic anticoagulation 07/28/2015  . Intractable back pain 07/20/2015  . Back pain 07/20/2015  . Cellulitis and abscess of left buttock 04/12/2013  . Stricture and stenosis of esophagus 10/10/2012  . Dementia ([place]) 10/06/2012  . Dysphagia 10/06/2012  . Axillary abscess - bilateral, multiple 09/27/2012  . Hypotension 08/24/2012  . Near syncope 08/24/2012  . Abscess of axilla, left 06/27/2012  . Bradycardia 06/13/2012  . Abscess 06/11/2012  . Chest pain 06/09/2012  . Cellulitis 06/09/2012  . Weakness of both legs 06/09/2012  . PMR (polymyalgia rheumatica) (HCC)   . Restless leg syndrome   . Angina pectoris, unstable ([place]) 03/02/2012  . Acute blood loss as cause of postoperative anemia 03/02/2012  . Dyspnea 02/03/2012  . UTI (urinary tract  infection) 02/03/2012  . CAD (coronary artery disease)   . HTN (hypertension)   . VTE (venous thromboembolism) 10/01/2010  . HYPERCHOLESTEROLEMIA  IIA 08/27/2009  . Hyperlipidemia 12/11/2008  . HYPERTENSION, BENIGN 12/11/2008    CMP     Component Value Date/Time   NA 137 09/20/2019 0000   K 4.8 09/20/2019 0000   CL 106 09/20/2019 0000   CO2 21 09/20/2019 0000   GLUCOSE 104 (H) 12/17/2018 0417   BUN 22 (A) 09/20/2019 0000   CREATININE 0.9 09/20/2019 0000   CREATININE 0.99  12/17/2018 0417   CALCIUM 8.5 (A) 09/20/2019 0000   PROT 6.7 03/11/2017 2031   ALBUMIN 3.4 (L) 03/11/2017 2031   AST 14 06/12/2019 0000   ALT 12 06/12/2019 0000   ALKPHOS 49 06/12/2019 0000   BILITOT 0.5 03/11/2017 2031   GFRNONAA 56.34 09/20/2019 0000   GFRAA 65.30 09/20/2019 0000   Recent Labs    12/14/18 0536 12/14/18 0536 12/15/18 0911 12/15/18 0911 12/16/18 [id number] 12/16/18 [id number] 12/17/18 0417 12/17/18 0417 12/27/18 0000 06/12/19 0000 09/20/19 0000  NA 138   < > 138   < > 136   < > 135  --  142 141 137  K 3.9   < > 3.4*   < > 4.0   < > 4.1   < > 3.6 4.3 4.8  CL 110   < > 108   < > 108  --  105  --   --   --  106  CO2 22   < > 23   < > 22  --  22  --   --   --  21  GLUCOSE 144*   < > 85  --  96  --  104*  --   --   --   --   BUN 25*   < > 25*   < > 21   < > 20  --  32* 25* 22*  CREATININE 1.13*   < > 0.81   < > 0.86   < > 0.99  --  0.9 0.8 0.9  CALCIUM 8.3*   < > 7.8*   < > 7.8*  --  7.7*  --   --   --  8.5*  MG 2.1  --  2.3  --  2.2  --   --   --   --   --   --    < > = values in this interval not displayed.   Recent Labs    06/12/19 0000  AST 14  ALT 12  ALKPHOS 49   Recent Labs    12/16/18 0611 12/16/18 0611 12/17/18 0417 12/17/18 0417 12/18/18 0408 12/18/18 0408 12/27/18 0000 06/12/19 0000 09/20/19 0000  WBC 7.4   < > 7.3   < > 8.2  --  12.2 7.0 7.0  NEUTROABS  --   --  4.7   < > 5.3  --   --  3 3  HGB 7.3*   < > 9.4*   < > 9.5*   < > 9.0* 11.1* 10.9*  HCT 24.5*   < >  31.3*   < > 31.4*   < > 28* 35* 34*  MCV 83.3  --  83.2  --  83.3  --   --   --   --   PLT 282   < > 265   < > 257   < > 404* 279 303   < > = values in this interval not displayed.   No results for input(s): CHOL, LDLCALC, TRIG in the last 8760 hours.  Invalid input(s): HCL No results found for: [facility] Lab Results  Component Value Date   TSH 1.71 08/01/2018   Lab Results  Component Value Date   HGBA1C 5.7 08/01/2018   Lab Results  Component Value Date   CHOL 140 08/01/2018   HDL 46 08/01/2018   LDLCALC 80 08/01/2018   TRIG 82 08/01/2018   CHOLHDL 2.4 03/03/2012    Significant Diagnostic Results in last  30 days:  No results found.  Assessment and Plan  Medical illness in adult-I have asked for chest x-ray CBC BMP and urine CNS.  Patient's white count came back at 18,009 did something rare currently, order Rocephin 1 g IM daily before her urine returned. We will follow up all of the results results.   Hennie Duos, MD

## 2019-12-03 ENCOUNTER — Non-Acute Institutional Stay (SKILLED_NURSING_FACILITY): Payer: Medicare Other | Admitting: Internal Medicine

## 2019-12-03 DIAGNOSIS — Z789 Other specified health status: Secondary | ICD-10-CM | POA: Diagnosis not present

## 2019-12-03 DIAGNOSIS — E559 Vitamin D deficiency, unspecified: Secondary | ICD-10-CM | POA: Diagnosis not present

## 2019-12-03 DIAGNOSIS — R4182 Altered mental status, unspecified: Secondary | ICD-10-CM | POA: Diagnosis not present

## 2019-12-03 DIAGNOSIS — R079 Chest pain, unspecified: Secondary | ICD-10-CM | POA: Diagnosis not present

## 2019-12-03 DIAGNOSIS — E039 Hypothyroidism, unspecified: Secondary | ICD-10-CM | POA: Diagnosis not present

## 2019-12-03 DIAGNOSIS — D649 Anemia, unspecified: Secondary | ICD-10-CM | POA: Diagnosis not present

## 2019-12-03 DIAGNOSIS — E119 Type 2 diabetes mellitus without complications: Secondary | ICD-10-CM | POA: Diagnosis not present

## 2019-12-03 LAB — BASIC METABOLIC PANEL
BUN: 15 (ref 4–21)
CO2: 18 (ref 13–22)
Chloride: 109 — AB (ref 99–108)
Creatinine: 0.8 (ref 0.5–1.1)
Glucose: 115
Potassium: 3.7 (ref 3.4–5.3)
Sodium: 143 (ref 137–147)

## 2019-12-03 LAB — CBC AND DIFFERENTIAL
HCT: 36 (ref 36–46)
Hemoglobin: 11.6 — AB (ref 12.0–16.0)
Neutrophils Absolute: 7
Platelets: 461 — AB (ref 150–399)
WBC: 8.9

## 2019-12-03 LAB — COMPREHENSIVE METABOLIC PANEL
Calcium: 9.1 (ref 8.7–10.7)
GFR calc Af Amer: 69.03
GFR calc non Af Amer: 59.56

## 2019-12-03 LAB — CBC: RBC: 4.21 (ref 3.87–5.11)

## 2019-12-03 NOTE — Progress Notes (Signed)
Location:  [facility] Room Number: 204-D Place of Service:  SNF (31)  Hennie Duos, MD  Patient Care Team: Hennie Duos, MD as PCP - General (Internal Medicine)  Extended Emergency Contact Information Primary Emergency Contact: Pirie,[name] Address: [address]          [place], [place] [address] [name] of [place] Phone: [phone] Mobile Phone: [phone] Relation: Son Secondary Emergency Contact: [place] of [place] Phone: [phone] Relation: Daughter    Allergies: Ambien [zolpidem tartrate], Codeine, Codeine phosphate, Darvocet [propoxyphene n-acetaminophen], Fish allergy, Fish-derived products, Hydrocodone, Promethazine, Promethazine hcl, Sulfamethoxazole, Vicodin [hydrocodone-acetaminophen], Amoxicillin, and Penicillins  Chief Complaint  Patient presents with  . Acute Visit    Patient seen for slurred speech.     HPI: Patient is a 86 y.o. female who is being seen for change in status.  The nurse initially had a question as to whether the patient could be having a stroke because she is slurring her speech a little when she talks, however everything she says makes sense and she can move all 4 extremities equally.  She is not coughing no vomiting diarrhea no fever no shortness of breath no complaints of pain in the nurses tell me the patient is drinking well.  Past Medical History:  Diagnosis Date  . Anemia 03/02/2012  . Angina pectoris, unstable ([place]) 03/02/2012  . Anxiety   . Axillary abscess   . Bradycardia 06/13/2012  . CAD (coronary artery disease)    S/p PTCA / stenting (last cath 2004, multiple LAD stents, 2 stents in the right coronary artery all patent)  . Cancer ([place])   . Complication of anesthesia   . Compression fracture of L2 ([place]) 03/12/2017  . Dementia ([place])   . Dysphagia 10/06/2012  . Dyspnea 02/03/2012  . GERD (gastroesophageal reflux disease)   . History of  pulmonary embolus (PE) 03/19/2017  . HTN (hypertension)   . PMR (polymyalgia rheumatica) (HCC)   . PONV (postoperative nausea and vomiting)   . Restless leg syndrome   . 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 Started coumadin  . Weakness of both legs 06/09/2012    Past Surgical History:  Procedure Laterality Date  . BREAST LUMPECTOMY    . COLECTOMY    . ESOPHAGOGASTRODUODENOSCOPY (EGD) WITH ESOPHAGEAL DILATION  10/10/2012   Procedure: ESOPHAGOGASTRODUODENOSCOPY (EGD) WITH ESOPHAGEAL DILATION;  Surgeon: Inda Castle, MD;  Location: [facility];  Service: Endoscopy;  Laterality: N/A;  . FEMUR IM NAIL Left 12/13/2018   Procedure: INTRAMEDULLARY (IM) NAIL FEMORAL LEFT;  Surgeon: Paralee Cancel, MD;  Location: [facility];  Service: Orthopedics;  Laterality: Left;  . heart stents  x 8  . INCISION AND DRAINAGE     bilateral axillary, non specific staff  . INCISION AND DRAINAGE ABSCESS  09/28/2012   Procedure: INCISION AND DRAINAGE ABSCESS;  Surgeon: Zenovia Jarred, MD;  Location: [facility];  Service: General;  Laterality: Bilateral;  . stent     cardiac x 8 stents.    Allergies as of 12/03/2019      Reactions   Ambien [zolpidem Tartrate] Nausea And Vomiting   Codeine    Codeine Phosphate Nausea And Vomiting   Darvocet [propoxyphene N-acetaminophen] Nausea And Vomiting   Fish Allergy    Fish-derived Products    Hydrocodone    Promethazine    Promethazine Hcl Nausea And Vomiting   Sulfamethoxazole Other (See Comments)   Pt doesn't  remember reaction   Vicodin [hydrocodone-acetaminophen] Other (See Comments)   Unknown reaction   Amoxicillin Nausea And Vomiting, Rash   Penicillins Nausea And Vomiting, Rash      Medication List       Accurate as of December 03, 2019 11:59 PM. If you have any questions, ask your nurse or doctor.        acetaminophen 325 MG tablet Commonly known as: TYLENOL Take 650 mg by mouth every 6 (six) hours as needed for mild pain, moderate pain or  fever.   acetaminophen 325 MG tablet Commonly known as: TYLENOL Take 650 mg by mouth every 6 (six) hours. Take routinely for left hip pain   ALPRAZolam 0.25 MG tablet Commonly known as: XANAX Take 0.25 mg by mouth daily.   amLODipine 10 MG tablet Commonly known as: NORVASC Take 10 mg by mouth every morning.   aspirin EC 81 MG tablet Take 1 tablet (81 mg total) by mouth daily.   atorvastatin 10 MG tablet Commonly known as: LIPITOR Take 10 mg by mouth every evening.   donepezil 10 MG tablet Commonly known as: ARICEPT Take 10 mg by mouth every evening.   losartan 50 MG tablet Commonly known as: COZAAR Take 50 mg by mouth at bedtime.   nitroGLYCERIN 0.4 MG SL tablet Commonly known as: NITROSTAT Place 0.4 mg under the tongue every 5 (five) minutes as needed for chest pain.   NON FORMULARY Diet Type:  Liberalized due to poor appetite to Mech soft   NUTRITIONAL SUPPLEMENT PO Take 1 each by mouth 2 (two) times a day. Magic Cup - take with lunch and dinner   ondansetron 4 MG tablet Commonly known as: ZOFRAN Take 4 mg by mouth every 4 (four) hours as needed for nausea.   oxybutynin 5 MG tablet Commonly known as: DITROPAN Take 5 mg by mouth 2 (two) times daily.   polyethylene glycol 17 g packet Commonly known as: MIRALAX / GLYCOLAX Take 17 g by mouth daily as needed for moderate constipation.   predniSONE 1 MG tablet Commonly known as: DELTASONE Take 1 mg by mouth daily with breakfast.   ProAir HFA 108 (90 Base) MCG/ACT inhaler Generic drug: albuterol Inhale 2 puffs into the lungs every 6 (six) hours as needed for wheezing or shortness of breath.   rOPINIRole 1 MG tablet Commonly known as: REQUIP Take 1 mg by mouth See admin instructions. 1mg  at noon, 1mg  2 hours before bed, then 1mg  at bedtime   senna-docusate 8.6-50 MG tablet Commonly known as: Senokot-S Take 1 tablet by mouth 2 (two) times daily.   Silenor 6 MG Tabs Generic drug: Doxepin HCl Take 0.5  tablets (3 mg total) by mouth at bedtime.   traMADol 50 MG tablet Commonly known as: ULTRAM Take 1 tablet (50 mg total) by mouth 2 (two) times daily.   Tums 500 MG chewable tablet Generic drug: calcium carbonate Chew 1 tablet by mouth 3 (three) times daily as needed for indigestion or heartburn.   vitamin C 500 MG tablet Commonly known as: ASCORBIC ACID Take 500 mg by mouth daily.   Vitamin D3 1.25 MG (50000 UT) Caps Take 1 capsule by mouth once a week.   warfarin 2 MG tablet Commonly known as: COUMADIN Take 2 mg by mouth See admin instructions. On Tues, Thurs, Saturday and Sunday   warfarin 3 MG tablet Commonly known as: COUMADIN Take 1.5 mg by mouth every Monday, Wednesday, and Friday. Take 0.5 tablet to = 1.5 mg  zinc sulfate 220 (50 Zn) MG capsule Take 220 mg by mouth every other day.       No orders of the defined types were placed in this encounter.   Immunization History  Administered Date(s) Administered  . Influenza-Unspecified 08/01/2016, 08/11/2017, 07/02/2018, 08/02/2019  . Moderna SARS-COVID-2 Vaccination 11/01/2019, 11/29/2019  . PPD Test 03/18/2017  . Pneumococcal Conjugate-13 09/17/2017  . Pneumococcal Polysaccharide-23 04/17/2019    Social History   Tobacco Use  . Smoking status: Never Smoker  . Smokeless tobacco: Never Used  Substance Use Topics  . Alcohol use: No    Review of Systems  GENERAL:  no fevers, fatigue, appetite changes SKIN: No itching, rash HEENT: No complaint RESPIRATORY: No cough, wheezing, SOB CARDIAC: No chest pain, palpitations, lower extremity edema  GI: No abdominal pain, No N/V/D or constipation, No heartburn or reflux  GU: No dysuria, frequency or urgency, or incontinence  MUSCULOSKELETAL: No unrelieved bone/joint pain NEUROLOGIC: No headache, dizziness  PSYCHIATRIC: No overt anxiety or sadness  Vitals:   12/03/19 1632  BP: 123/68  Pulse: 72  Resp: 18  Temp: 97.6 F (36.4 C)   Body mass index is 24.79  kg/m. Physical Exam  GENERAL APPEARANCE: Alert, conversant, No acute distress  SKIN: No diaphoresis rash HEENT: Unremarkable RESPIRATORY: Breathing is even, unlabored. Lung sounds are clear   CARDIOVASCULAR: Heart RRR no murmurs, rubs or gallops. No peripheral edema  GASTROINTESTINAL: Abdomen is soft, non-tender, not distended w/ normal bowel sounds.  GENITOURINARY: Bladder non tender, not distended  MUSCULOSKELETAL: No abnormal joints or musculature NEUROLOGIC: Cranial nerves 2-12 grossly intact. Moves all extremities; speech is slow and slightly slurred but there is no facial weakness and the patient makes perfect sense when she speaks PSYCHIATRIC: Mood and affect appropriate to situation, no behavioral issues  Patient Active Problem List   Diagnosis Date Noted  . Failure to thrive in adult 01/08/2019  . Moderate protein malnutrition ([place]) 01/08/2019  . Pressure injury of skin 12/18/2018  . Intertrochanteric fracture of left femur ([place]) 12/11/2018  . CKD (chronic kidney disease), stage III 12/11/2018  . Depression with anxiety 12/11/2018  . Overactive bladder 09/24/2017  . Insomnia 07/31/2017  . Anxiety 06/16/2017  . Citrobacter infection 03/19/2017  . AKI (acute kidney injury) ([place]) 03/19/2017  . History of pulmonary embolus (PE) 03/19/2017  . Elevated INR 03/19/2017  . Candidal intertrigo 03/19/2017  . Compression fracture of L2 ([place]) 03/12/2017  . Acute lower UTI 03/12/2017  . Accelerated hypertension 03/12/2017  . Leukocytosis 07/31/2015  . Acute bronchitis 07/28/2015  . Chronic anticoagulation 07/28/2015  . Intractable back pain 07/20/2015  . Back pain 07/20/2015  . Cellulitis and abscess of left buttock 04/12/2013  . Stricture and stenosis of esophagus 10/10/2012  . Dementia ([place]) 10/06/2012  . Dysphagia 10/06/2012  . Axillary abscess - bilateral, multiple 09/27/2012  . Hypotension 08/24/2012  . Near syncope 08/24/2012  . Abscess of axilla, left 06/27/2012  .  Bradycardia 06/13/2012  . Abscess 06/11/2012  . Chest pain 06/09/2012  . Cellulitis 06/09/2012  . Weakness of both legs 06/09/2012  . PMR (polymyalgia rheumatica) (HCC)   . Restless leg syndrome   . Angina pectoris, unstable ([facility]) 03/02/2012  . Acute blood loss as cause of postoperative anemia 03/02/2012  . Dyspnea 02/03/2012  . UTI (urinary tract infection) 02/03/2012  . CAD (coronary artery disease)   . HTN (hypertension)   . VTE (venous thromboembolism) 10/01/2010  . HYPERCHOLESTEROLEMIA  IIA 08/27/2009  . Hyperlipidemia 12/11/2008  . HYPERTENSION, BENIGN  12/11/2008    CMP     Component Value Date/Time   NA 137 09/20/2019 0000   K 4.8 09/20/2019 0000   CL 106 09/20/2019 0000   CO2 21 09/20/2019 0000   GLUCOSE 104 (H) 12/17/2018 0417   BUN 22 (A) 09/20/2019 0000   CREATININE 0.9 09/20/2019 0000   CREATININE 0.99 12/17/2018 0417   CALCIUM 8.5 (A) 09/20/2019 0000   PROT 6.7 03/11/2017 2031   ALBUMIN 3.4 (L) 03/11/2017 2031   AST 14 06/12/2019 0000   ALT 12 06/12/2019 0000   ALKPHOS 49 06/12/2019 0000   BILITOT 0.5 03/11/2017 2031   GFRNONAA 56.34 09/20/2019 0000   GFRAA 65.30 09/20/2019 0000   Recent Labs    12/14/18 0536 12/14/18 0536 12/15/18 0911 12/15/18 0911 12/16/18 [id number] 12/16/18 0611 12/17/18 0417 12/17/18 0417 12/27/18 0000 06/12/19 0000 09/20/19 0000  NA 138   < > 138   < > 136   < > 135  --  142 141 137  K 3.9   < > 3.4*   < > 4.0   < > 4.1   < > 3.6 4.3 4.8  CL 110   < > 108   < > 108  --  105  --   --   --  106  CO2 22   < > 23   < > 22  --  22  --   --   --  21  GLUCOSE 144*   < > 85  --  96  --  104*  --   --   --   --   BUN 25*   < > 25*   < > 21   < > 20  --  32* 25* 22*  CREATININE 1.13*   < > 0.81   < > 0.86   < > 0.99  --  0.9 0.8 0.9  CALCIUM 8.3*   < > 7.8*   < > 7.8*  --  7.7*  --   --   --  8.5*  MG 2.1  --  2.3  --  2.2  --   --   --   --   --   --    < > = values in this interval not displayed.   Recent Labs    06/12/19 0000    AST 14  ALT 12  ALKPHOS 49   Recent Labs    12/16/18 0611 12/16/18 0611 12/17/18 0417 12/17/18 0417 12/18/18 0408 12/18/18 0408 12/27/18 0000 06/12/19 0000 09/20/19 0000  WBC 7.4   < > 7.3   < > 8.2  --  12.2 7.0 7.0  NEUTROABS  --   --  4.7   < > 5.3  --   --  3 3  HGB 7.3*   < > 9.4*   < > 9.5*   < > 9.0* 11.1* 10.9*  HCT 24.5*   < > 31.3*   < > 31.4*   < > 28* 35* 34*  MCV 83.3  --  83.2  --  83.3  --   --   --   --   PLT 282   < > 265   < > 257   < > 404* 279 303   < > = values in this interval not displayed.   No results for input(s): CHOL, LDLCALC, TRIG in the last 8760 hours.  Invalid input(s): HCL No results found for: [facility] Lab Results  Component  Value Date   TSH 1.71 08/01/2018   Lab Results  Component Value Date   HGBA1C 5.7 08/01/2018   Lab Results  Component Value Date   CHOL 140 08/01/2018   HDL 46 08/01/2018   LDLCALC 80 08/01/2018   TRIG 82 08/01/2018   CHOLHDL 2.4 03/03/2012    Significant Diagnostic Results in last 30 days:  No results found.  Assessment and Plan  Acute illness-think patient is acutely ill, not having a stroke; will order CBC BMP UA with C and S ,chest x-ray; patient's O2 saturation is 93% on room air; rapid Covid test is negative  Later entry-patient's WBC is 18,000; I have ordered Rocephin 1 g Daily for 7 days until return of chest x-ray; suspect pneumonia as patient was just recently treated for UTI   Hennie Duos, MD

## 2019-12-04 ENCOUNTER — Encounter: Payer: Self-pay | Admitting: Internal Medicine

## 2019-12-05 ENCOUNTER — Non-Acute Institutional Stay (SKILLED_NURSING_FACILITY): Payer: Medicare Other | Admitting: Internal Medicine

## 2019-12-05 ENCOUNTER — Encounter: Payer: Self-pay | Admitting: Internal Medicine

## 2019-12-05 ENCOUNTER — Other Ambulatory Visit: Payer: Self-pay | Admitting: Internal Medicine

## 2019-12-05 DIAGNOSIS — R4182 Altered mental status, unspecified: Secondary | ICD-10-CM

## 2019-12-05 MED ORDER — ALPRAZOLAM 0.25 MG PO TABS: 0.2500 mg | ORAL_TABLET | Freq: Every day | ORAL | 0 refills | Status: DC

## 2019-12-05 NOTE — Progress Notes (Signed)
Location:    [facility] Room Number: 204/D Place of Service:  SNF [phone]) Provider:  Henreitta Leber, MD  Patient Care Team: Hennie Duos, MD as PCP - General (Internal Medicine)  Extended Emergency Contact Information Primary Emergency Contact: Malta,[name] Address: [place], [place] [address] [name] of [place] Phone: [phone] Mobile Phone: [phone] Relation: Son Secondary Emergency Contact: [place] of [place] Phone: [phone] Relation: Daughter  Code Status:  DNR Goals of care: Advanced Directive information Advanced Directives 12/05/2019  Does Patient Have a Medical Advance Directive? Yes  Type of Advance Directive Out of facility DNR (pink MOST or yellow form)  Does patient want to make changes to medical advance directive? No - Patient declined  Copy of [place] in Chart? -  Pre-existing out of facility DNR order (yellow form or pink MOST form) Yellow form placed in chart (order not valid for inpatient use)     Chief complaint acute visit follow-up change in status earlier in the week  HPI:  Pt is a 86 y.o. female seen today for an acute visit for  Follow-up of a change in status earlier this week.  She is a long-term resident of the facility with a history of dementia as well as coronary artery disease-history of pulmonary embolism on chronic Coumadin also a history of overactive bladder polymyalgia rheumatica constipation anxiety hypertension and insomnia  Patient was seen earlier this week because staff says she was just different-appeared to be weaker possibly more confused.  When Dr. [name] and I saw her she appeared possibly somewhat more confused but was responding pretty appropriately-neurologically appeared to be intact-there were suspicions she possibly had a UTI.  Labs were ordered which actually came back quite  unremarkable.  Urinalysis looked fairly benign with negative nitrites negative leukocytes.  Chest-x-ray was negative for any acute process.  Her vital signs have remained stable and today she appears to be essentially at her baseline.  Resting in bed comfortably coloring in her book.    Past Medical History:  Diagnosis Date  . Anemia 03/02/2012  . Angina pectoris, unstable ([place]) 03/02/2012  . Anxiety   . Axillary abscess   . Bradycardia 06/13/2012  . CAD (coronary artery disease)    S/p PTCA / stenting (last cath 2004, multiple LAD stents, 2 stents in the right coronary artery all patent)  . Cancer ([place])   . Complication of anesthesia   . Compression fracture of L2 ([place]) 03/12/2017  . Dementia ([place])   . Dysphagia 10/06/2012  . Dyspnea 02/03/2012  . GERD (gastroesophageal reflux disease)   . History of pulmonary embolus (PE) 03/19/2017  . HTN (hypertension)   . PMR (polymyalgia rheumatica) (HCC)   . PONV (postoperative nausea and vomiting)   . Restless leg syndrome   . 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 Started coumadin  . Weakness of both legs 06/09/2012   Past Surgical History:  Procedure Laterality Date  . BREAST LUMPECTOMY    . COLECTOMY    . ESOPHAGOGASTRODUODENOSCOPY (EGD) WITH ESOPHAGEAL DILATION  10/10/2012   Procedure: ESOPHAGOGASTRODUODENOSCOPY (EGD) WITH ESOPHAGEAL DILATION;  Surgeon: Inda Castle, MD;  Location: [facility];  Service: Endoscopy;  Laterality: N/A;  . FEMUR IM NAIL Left 12/13/2018   Procedure: INTRAMEDULLARY (IM) NAIL FEMORAL LEFT;  Surgeon: Paralee Cancel, MD;  [facility];  Service: Orthopedics;  Laterality: Left;  . heart stents  x 8  . INCISION AND DRAINAGE     bilateral axillary, non specific staff  . INCISION AND DRAINAGE ABSCESS  09/28/2012   Procedure: INCISION AND DRAINAGE ABSCESS;  Surgeon: Zenovia Jarred, MD;  Location: [facility];  Service: General;  Laterality: Bilateral;  . stent     cardiac x 8 stents.    Allergies   Allergen Reactions  . Ambien [Zolpidem Tartrate] Nausea And Vomiting  . Codeine   . Codeine Phosphate Nausea And Vomiting  . Darvocet [Propoxyphene N-Acetaminophen] Nausea And Vomiting  . Fish Allergy   . Fish-Derived Products   . Hydrocodone   . Promethazine   . Promethazine Hcl Nausea And Vomiting  . Sulfamethoxazole Other (See Comments)    Pt doesn't remember reaction  . Vicodin [Hydrocodone-Acetaminophen] Other (See Comments)    Unknown reaction  . Amoxicillin Nausea And Vomiting and Rash  . Penicillins Nausea And Vomiting and Rash    Outpatient Encounter Medications as of 12/05/2019  Medication Sig  . acetaminophen (TYLENOL) 325 MG tablet Take 650 mg by mouth every 6 (six) hours as needed for mild pain, moderate pain or fever.  Marland Kitchen acetaminophen (TYLENOL) 325 MG tablet Take 650 mg by mouth every 6 (six) hours. Take routinely for left hip pain  . albuterol (PROAIR HFA) 108 (90 Base) MCG/ACT inhaler Inhale 2 puffs into the lungs every 6 (six) hours as needed for wheezing or shortness of breath.  . ALPRAZolam (XANAX) 0.25 MG tablet Take 1 tablet (0.25 mg total) by mouth daily.  Marland Kitchen amLODipine (NORVASC) 10 MG tablet Take 10 mg by mouth every morning.   Marland Kitchen aspirin EC 81 MG tablet Take 1 tablet (81 mg total) by mouth daily.  Marland Kitchen atorvastatin (LIPITOR) 10 MG tablet Take 10 mg by mouth every evening.  . calcium carbonate (TUMS) 500 MG chewable tablet Chew 1 tablet by mouth 3 (three) times daily as needed for indigestion or heartburn.  . Cholecalciferol (VITAMIN D3) 1.25 MG (50000 UT) CAPS Take 1 capsule by mouth once a week.  . donepezil (ARICEPT) 10 MG tablet Take 10 mg by mouth every evening.   Marland Kitchen losartan (COZAAR) 50 MG tablet Take 50 mg by mouth at bedtime.   . nitroGLYCERIN (NITROSTAT) 0.4 MG SL tablet Place 0.4 mg under the tongue every 5 (five) minutes as needed for chest pain.  . NON FORMULARY Diet Type:  Liberalized due to poor appetite to Mech soft  . Nutritional Supplements  (NUTRITIONAL SUPPLEMENT PO) Take 1 each by mouth 2 (two) times a day. Magic Cup - take with lunch and dinner  . ondansetron (ZOFRAN) 4 MG tablet Take 4 mg by mouth every 4 (four) hours as needed for nausea.   Marland Kitchen oxybutynin (DITROPAN) 5 MG tablet Take 5 mg by mouth 2 (two) times daily.   . polyethylene glycol (MIRALAX / GLYCOLAX) packet Take 17 g by mouth daily as needed for moderate constipation.  . predniSONE (DELTASONE) 1 MG tablet Take 1 mg by mouth daily with breakfast.   . rOPINIRole (REQUIP) 1 MG tablet Take 1 mg by mouth See admin instructions. 1mg  at noon, 1mg  2 hours before bed, then 1mg  at bedtime  . senna-docusate (SENOKOT-S) 8.6-50 MG tablet Take 1 tablet by mouth 2 (two) times daily.  Marland Kitchen SILENOR 6 MG TABS Take 0.5 tablets (3 mg total) by mouth at bedtime.  . traMADol (ULTRAM) 50 MG tablet Take 1 tablet (50 mg total) by mouth  2 (two) times daily.  . vitamin C (ASCORBIC ACID) 500 MG tablet Take 500 mg by mouth daily.  Marland Kitchen warfarin (COUMADIN) 2 MG tablet Take 2 mg by mouth See admin instructions. On Tues, Thurs, Saturday and Sunday  . warfarin (COUMADIN) 3 MG tablet Take 1.5 mg by mouth every Monday, Wednesday, and Friday. Take 0.5 tablet to = 1.5 mg  . zinc sulfate 220 (50 Zn) MG capsule Take 220 mg by mouth every other day.   No facility-administered encounter medications on file as of 12/05/2019.    Review of Systems   This is limited secondary to dementia but per nursing is at her baseline.  Gretchan herself does not complain of any shortness of breath or dysuria or chest pain or fever or chills.  She is not complaining of any pain  Immunization History  Administered Date(s) Administered  . Influenza-Unspecified 08/01/2016, 08/11/2017, 07/02/2018, 08/02/2019  . Moderna SARS-COVID-2 Vaccination 11/01/2019, 11/29/2019  . PPD Test 03/18/2017  . Pneumococcal Conjugate-13 09/17/2017  . Pneumococcal Polysaccharide-23 04/17/2019   Pertinent  Health Maintenance Due  Topic Date Due    . INFLUENZA VACCINE  Completed  . DEXA SCAN  Completed  . PNA vac Low Risk Adult  Completed   Fall Risk  03/14/2018  Falls in the past year? No   Functional Status Survey:    Vitals:   12/05/19 1039  BP: 123/74  Pulse: 80  Resp: 20  Temp: 97.8 F (36.6 C)  TempSrc: Oral  SpO2: 97%  Weight: 131 lb 3.2 oz (59.5 kg)  Height: 5\' 1"  (1.549 m)   Body mass index is 24.79 kg/m. Physical Exam   In general this is a pleasant elderly female who appears to be back at her baseline sitting up in bed coloring in her book.  She is bright and alert at baseline.  Her skin is warm and dry.  Eyes visual acuity appears to be intact sclera and conjunctive are clear.  Oropharynx is clear mucous membranes moist.  Chest is clear to auscultation there is no labored breathing.  Heart is irregular irregular rate and rhythm without murmur gallop or rub she has mild baseline lower extremity edema.  Abdomen is soft nontender with positive bowel sounds.  GU could not really appreciate suprapubic tenderness.  .  Musculoskeletal appears able to move all her extremities x4 at baseline.  Neurologic is grossly intact cannot appreciate lateralizing findings her speech is clear.  Psych she is pleasant and appropriate follow simple verbal commands-which is her baseline she is alert coloring in her book.    Labs reviewed: December 03, 2019.  WBC 8.9 hemoglobin 11.6 platelets 461.  Sodium 143 potassium 3.7 BUN 14.8 creatinine 0.83.    Recent Labs    12/14/18 0536 12/14/18 0536 12/15/18 0911 12/15/18 0911 12/16/18 [id number] 12/16/18 0611 12/17/18 0417 12/17/18 0417 12/27/18 0000 06/12/19 0000 09/20/19 0000  NA 138   < > 138   < > 136   < > 135  --  142 141 137  K 3.9   < > 3.4*   < > 4.0   < > 4.1   < > 3.6 4.3 4.8  CL 110   < > 108   < > 108  --  105  --   --   --  106  CO2 22   < > 23   < > 22  --  22  --   --   --  21  GLUCOSE 144*   < >  85  --  96  --  104*  --   --   --   --    BUN 25*   < > 25*   < > 21   < > 20  --  32* 25* 22*  CREATININE 1.13*   < > 0.81   < > 0.86   < > 0.99  --  0.9 0.8 0.9  CALCIUM 8.3*   < > 7.8*   < > 7.8*  --  7.7*  --   --   --  8.5*  MG 2.1  --  2.3  --  2.2  --   --   --   --   --   --    < > = values in this interval not displayed.   Recent Labs    06/12/19 0000  AST 14  ALT 12  ALKPHOS 49   Recent Labs    12/16/18 0611 12/16/18 0611 12/17/18 0417 12/17/18 0417 12/18/18 0408 12/18/18 0408 12/27/18 0000 06/12/19 0000 09/20/19 0000  WBC 7.4   < > 7.3   < > 8.2  --  12.2 7.0 7.0  NEUTROABS  --   --  4.7   < > 5.3  --   --  3 3  HGB 7.3*   < > 9.4*   < > 9.5*   < > 9.0* 11.1* 10.9*  HCT 24.5*   < > 31.3*   < > 31.4*   < > 28* 35* 34*  MCV 83.3  --  83.2  --  83.3  --   --   --   --   PLT 282   < > 265   < > 257   < > 404* 279 303   < > = values in this interval not displayed.   Lab Results  Component Value Date   TSH 1.71 08/01/2018   Lab Results  Component Value Date   HGBA1C 5.7 08/01/2018   Lab Results  Component Value Date   CHOL 140 08/01/2018   HDL 46 08/01/2018   LDLCALC 80 08/01/2018   TRIG 82 08/01/2018   CHOLHDL 2.4 03/03/2012    Significant Diagnostic Results in last 30 days:  No results found.  Assessment/Plan  History of change in status-she appears to be back at her baseline lab work appears to be baseline-her urinalysis looks fairly benign-chest x-ray was negative for any acute process.  Her vital signs remained stable.  At this point continue to monitor. She is back doing well at this point.  [id number]

## 2019-12-06 ENCOUNTER — Encounter: Payer: Self-pay | Admitting: Internal Medicine

## 2019-12-06 ENCOUNTER — Non-Acute Institutional Stay (SKILLED_NURSING_FACILITY): Payer: Medicare Other | Admitting: Internal Medicine

## 2019-12-06 DIAGNOSIS — R791 Abnormal coagulation profile: Secondary | ICD-10-CM

## 2019-12-06 DIAGNOSIS — R21 Rash and other nonspecific skin eruption: Secondary | ICD-10-CM | POA: Diagnosis not present

## 2019-12-06 NOTE — Progress Notes (Signed)
Location:    [facility] Room Number: 204/D Place of Service:  SNF [phone]) Provider:  Henreitta Leber, MD  Patient Care Team: Hennie Duos, MD as PCP - General (Internal Medicine)  Extended Emergency Contact Information Primary Emergency Contact: Walczyk,[name] Address: [address]          [place], [place] [address] [name] of [place] Phone: [phone] Mobile Phone: [phone] Relation: Son Secondary Emergency Contact: [place] of [place] Phone: [phone] Relation: Daughter  Code Status:  DNR Goals of care: Advanced Directive information Advanced Directives 12/06/2019  Does Patient Have a Medical Advance Directive? Yes  Type of Advance Directive Out of facility DNR (pink MOST or yellow form)  Does patient want to make changes to medical advance directive? No - Patient declined  Copy of [place] in Chart? -  Pre-existing out of facility DNR order (yellow form or pink MOST form) Yellow form placed in chart (order not valid for inpatient use)     Chief Complaint  Patient presents with  . Acute Visit    Elevated INR    HPI:  Pt is a 86 y.o. female seen today for an acute visit for an elevated INR of 8.0.  Patient is on Coumadin with a history of DVT and pulmonary embolism in the past.  INR last week was 2.9 but today is 8.0.  There is no increased bruising or bleeding she appears to be at baseline.  She was recently seen for change in status but labs were reassuring as well as urinalysis and chest x-ray-and she has essentially returned to her baseline.  Of note she does have a perineal rash and yesterday  was started on nystatin and apparently has received a dose of Diflucan as well.  Currently she is resting in bed comfortably and does not really have any complaints vital signs are stable     Past Medical History:  Diagnosis Date  . Anemia 03/02/2012   . Angina pectoris, unstable ([facility]) 03/02/2012  . Anxiety   . Axillary abscess   . Bradycardia 06/13/2012  . CAD (coronary artery disease)    S/p PTCA / stenting (last cath 2004, multiple LAD stents, 2 stents in the right coronary artery all patent)  . Cancer ([facility])   . Complication of anesthesia   . Compression fracture of L2 ([facility]) 03/12/2017  . Dementia ([facility])   . Dysphagia 10/06/2012  . Dyspnea 02/03/2012  . GERD (gastroesophageal reflux disease)   . History of pulmonary embolus (PE) 03/19/2017  . HTN (hypertension)   . PMR (polymyalgia rheumatica) (HCC)   . PONV (postoperative nausea and vomiting)   . Restless leg syndrome   . 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 Started coumadin  . Weakness of both legs 06/09/2012   Past Surgical History:  Procedure Laterality Date  . BREAST LUMPECTOMY    . COLECTOMY    . ESOPHAGOGASTRODUODENOSCOPY (EGD) WITH ESOPHAGEAL DILATION  10/10/2012   Procedure: ESOPHAGOGASTRODUODENOSCOPY (EGD) WITH ESOPHAGEAL DILATION;  Surgeon: Inda Castle, MD;  Location: [facility];  Service: Endoscopy;  Laterality: N/A;  . FEMUR IM NAIL Left 12/13/2018   Procedure: INTRAMEDULLARY (IM) NAIL FEMORAL LEFT;  Surgeon: Paralee Cancel, MD;  Location: [facility];  Service: Orthopedics;  Laterality: Left;  . heart stents  x 8  . INCISION AND DRAINAGE     bilateral axillary, non specific staff  . INCISION AND  DRAINAGE ABSCESS  09/28/2012   Procedure: INCISION AND DRAINAGE ABSCESS;  Surgeon: Zenovia Jarred, MD;  Location: [facility];  Service: General;  Laterality: Bilateral;  . stent     cardiac x 8 stents.    Allergies  Allergen Reactions  . Ambien [Zolpidem Tartrate] Nausea And Vomiting  . Codeine   . Codeine Phosphate Nausea And Vomiting  . Darvocet [Propoxyphene N-Acetaminophen] Nausea And Vomiting  . Fish Allergy   . Fish-Derived Products   . Hydrocodone   . Promethazine   . Promethazine Hcl Nausea And Vomiting  . Sulfamethoxazole Other (See Comments)     Pt doesn't remember reaction  . Vicodin [Hydrocodone-Acetaminophen] Other (See Comments)    Unknown reaction  . Amoxicillin Nausea And Vomiting and Rash  . Penicillins Nausea And Vomiting and Rash    Outpatient Encounter Medications as of 12/06/2019  Medication Sig  . acetaminophen (TYLENOL) 325 MG tablet Take 650 mg by mouth every 6 (six) hours as needed for mild pain, moderate pain or fever.  Marland Kitchen acetaminophen (TYLENOL) 325 MG tablet Take 650 mg by mouth every 6 (six) hours. Take routinely for left hip pain  . albuterol (PROAIR HFA) 108 (90 Base) MCG/ACT inhaler Inhale 2 puffs into the lungs every 6 (six) hours as needed for wheezing or shortness of breath.  . ALPRAZolam (XANAX) 0.25 MG tablet Take 1 tablet (0.25 mg total) by mouth daily.  Marland Kitchen amLODipine (NORVASC) 10 MG tablet Take 10 mg by mouth every morning.   Marland Kitchen aspirin EC 81 MG tablet Take 1 tablet (81 mg total) by mouth daily.  Marland Kitchen atorvastatin (LIPITOR) 10 MG tablet Take 10 mg by mouth every evening.  . calcium carbonate (TUMS) 500 MG chewable tablet Chew 1 tablet by mouth 3 (three) times daily as needed for indigestion or heartburn.  . Cholecalciferol (VITAMIN D3) 1.25 MG (50000 UT) CAPS Take 1 capsule by mouth once a week.  . donepezil (ARICEPT) 10 MG tablet Take 10 mg by mouth every evening.   Marland Kitchen losartan (COZAAR) 50 MG tablet Take 50 mg by mouth at bedtime.   . nitroGLYCERIN (NITROSTAT) 0.4 MG SL tablet Place 0.4 mg under the tongue every 5 (five) minutes as needed for chest pain.  . NON FORMULARY Diet Type:  Liberalized due to poor appetite to Mech soft  . Nutritional Supplements (NUTRITIONAL SUPPLEMENT PO) Take 1 each by mouth 2 (two) times a day. Magic Cup - take with lunch and dinner  . nystatin cream (MYCOSTATIN) apply to perineal area/guteal cleft  . ondansetron (ZOFRAN) 4 MG tablet Take 4 mg by mouth every 4 (four) hours as needed for nausea.   Marland Kitchen oxybutynin (DITROPAN) 5 MG tablet Take 5 mg by mouth 2 (two) times daily.   .  polyethylene glycol (MIRALAX / GLYCOLAX) packet Take 17 g by mouth daily as needed for moderate constipation.  . predniSONE (DELTASONE) 1 MG tablet Take 1 mg by mouth daily with breakfast.   . rOPINIRole (REQUIP) 1 MG tablet Take 1 mg by mouth See admin instructions. 1mg  at noon, 1mg  2 hours before bed, then 1mg  at bedtime  . senna-docusate (SENOKOT-S) 8.6-50 MG tablet Take 1 tablet by mouth 2 (two) times daily.  Marland Kitchen SILENOR 6 MG TABS Take 0.5 tablets (3 mg total) by mouth at bedtime.  . traMADol (ULTRAM) 50 MG tablet Take 1 tablet (50 mg total) by mouth 2 (two) times daily.  . vitamin C (ASCORBIC ACID) 500 MG tablet Take 500 mg by mouth daily.  Marland Kitchen  zinc sulfate 220 (50 Zn) MG capsule Take 220 mg by mouth every other day.  . [DISCONTINUED] warfarin (COUMADIN) 2 MG tablet Take 2 mg by mouth See admin instructions. On Tues, Thurs, Saturday and Sunday  . [DISCONTINUED] warfarin (COUMADIN) 3 MG tablet Take 1.5 mg by mouth every Monday, Wednesday, and Friday. Take 0.5 tablet to = 1.5 mg   No facility-administered encounter medications on file as of 12/06/2019.    Review of Systems   This is limited somewhat to dementia.  In general no complaints of fever or chills.  Skin no increased bruising or bleeding complaints.   does have a perineal rash  Head ears eyes nose mouth and throat does not complain of visual changes or sore throat.  Respiratory no complaints of shortness of breath or cough.  Cardiac does not complain of chest pain or increased edema from baseline.  GI does not complain of being nauseous or having vomiting or diarrhea constipation.  GU no complaints of dysuria.  Musculoskeletal has chronic lower extremity weakness but does not complain of pain.  Neurologic does not complain of increased weakness from baseline or dizziness or headache.  And psych does have a history of dementia does not complain of being depressed or anxious.    Immunization History  Administered Date(s)  Administered  . Influenza-Unspecified 08/01/2016, 08/11/2017, 07/02/2018, 08/02/2019  . Moderna SARS-COVID-2 Vaccination 11/01/2019, 11/29/2019  . PPD Test 03/18/2017  . Pneumococcal Conjugate-13 09/17/2017  . Pneumococcal Polysaccharide-23 04/17/2019   Pertinent  Health Maintenance Due  Topic Date Due  . INFLUENZA VACCINE  Completed  . DEXA SCAN  Completed  . PNA vac Low Risk Adult  Completed   Fall Risk  03/14/2018  Falls in the past year? No   Functional Status Survey:    Vitals:   12/06/19 1641  BP: 117/67  Pulse: (!) 51  Resp: 19  Temp: (!) 96.4 F (35.8 C)  TempSrc: Oral  SpO2: 97%  Weight: 131 lb 3.2 oz (59.5 kg)  Height: 5\' 1"  (1.549 m)   Body mass index is 24.79 kg/m. Physical Exam General this is a pleasant elderly female no distress resting comfortably in bed.  Her skin is warm and dry do not see any increased bruising from what is her baseline She does have a erythematous perineal rash in groin area bilaterally  Eyes visual acuity appears to be intact sclera and conjunctive are clear.  Oropharynx is clear mucous membranes moist.  Chest is clear to auscultation there is no labored breathing.  Heart is irregular irregular rate and rhythm with pulse in the 50s-she has chronic baseline lower extremity edema this is pretty mild.  Abdomen is soft nontender with positive bowel sounds.  Musculoskeletal moves all extremities x4 at baseline limited exam since she is in bed.  Neurologic appears grossly intact she is alert.  Psych she is oriented to self she is pleasant appropriate at baseline follows simple verbal commands without difficulty Labs reviewed:  December 06, 2019.  INR is 8.0.  Fill a first 2021.  WBC 8.9 hemoglobin 11.6 platelets 461.  Sodium 143 potassium 3.7 BUN 14.8 creatinine 0.83.   Recent Labs    12/14/18 0536 12/14/18 0536 12/15/18 0911 12/15/18 0911 12/16/18 [id number] 12/16/18 0611 12/17/18 0417 12/17/18 0417 12/27/18 0000  06/12/19 0000 09/20/19 0000  NA 138   < > 138   < > 136   < > 135  --  142 141 137  K 3.9   < > 3.4*   < >  4.0   < > 4.1   < > 3.6 4.3 4.8  CL 110   < > 108   < > 108  --  105  --   --   --  106  CO2 22   < > 23   < > 22  --  22  --   --   --  21  GLUCOSE 144*   < > 85  --  96  --  104*  --   --   --   --   BUN 25*   < > 25*   < > 21   < > 20  --  32* 25* 22*  CREATININE 1.13*   < > 0.81   < > 0.86   < > 0.99  --  0.9 0.8 0.9  CALCIUM 8.3*   < > 7.8*   < > 7.8*  --  7.7*  --   --   --  8.5*  MG 2.1  --  2.3  --  2.2  --   --   --   --   --   --    < > = values in this interval not displayed.   Recent Labs    06/12/19 0000  AST 14  ALT 12  ALKPHOS 49   Recent Labs    12/16/18 0611 12/16/18 0611 12/17/18 0417 12/17/18 0417 12/18/18 0408 12/18/18 0408 12/27/18 0000 06/12/19 0000 09/20/19 0000  WBC 7.4   < > 7.3   < > 8.2  --  12.2 7.0 7.0  NEUTROABS  --   --  4.7   < > 5.3  --   --  3 3  HGB 7.3*   < > 9.4*   < > 9.5*   < > 9.0* 11.1* 10.9*  HCT 24.5*   < > 31.3*   < > 31.4*   < > 28* 35* 34*  MCV 83.3  --  83.2  --  83.3  --   --   --   --   PLT 282   < > 265   < > 257   < > 404* 279 303   < > = values in this interval not displayed.   Lab Results  Component Value Date   TSH 1.71 08/01/2018   Lab Results  Component Value Date   HGBA1C 5.7 08/01/2018   Lab Results  Component Value Date   CHOL 140 08/01/2018   HDL 46 08/01/2018   LDLCALC 80 08/01/2018   TRIG 82 08/01/2018   CHOLHDL 2.4 03/03/2012    Significant Diagnostic Results in last 30 days:  No results found.  Assessment/Plan #1-history of elevated INR-INR is significantly elevated will hold Coumadin also discontinue the Diflucan.  At this point bedrest until further notice-patient largely remains in bed at baseline.  She does not show evidence of any increased bruising or bleeding but this will need to be monitored bed   She will need an INR tomorrow-.  Clinically she remains stable and improved  from what her status was earlier this week  #2 perineal rash-will continue nystatin topical-Diflucan will need to be discontinued secondary to elevated INR.    [id number]

## 2019-12-07 ENCOUNTER — Non-Acute Institutional Stay (SKILLED_NURSING_FACILITY): Payer: Medicare Other | Admitting: Internal Medicine

## 2019-12-07 ENCOUNTER — Encounter: Payer: Self-pay | Admitting: Internal Medicine

## 2019-12-07 DIAGNOSIS — R791 Abnormal coagulation profile: Secondary | ICD-10-CM

## 2019-12-07 DIAGNOSIS — Z7901 Long term (current) use of anticoagulants: Secondary | ICD-10-CM

## 2019-12-07 DIAGNOSIS — Z86711 Personal history of pulmonary embolism: Secondary | ICD-10-CM | POA: Diagnosis not present

## 2019-12-07 NOTE — Progress Notes (Signed)
Location:  [facility] Room Number: 204-D Place of Service:  SNF (31)  Hennie Duos, MD  Patient Care Team: Hennie Duos, MD as PCP - General (Internal Medicine)  Extended Emergency Contact Information Primary Emergency Contact: Maue,[name] Address: [address]          [place], [place] [address] [name] of [place] Phone: [phone] Mobile Phone: [phone] Relation: Son Secondary Emergency Contact: [place] of [place] Phone: [phone] Relation: Daughter    Allergies: Ambien [zolpidem tartrate], Codeine, Codeine phosphate, Darvocet [propoxyphene n-acetaminophen], Fish allergy, Fish-derived products, Hydrocodone, Promethazine, Promethazine hcl, Sulfamethoxazole, Vicodin [hydrocodone-acetaminophen], Amoxicillin, and Penicillins  Chief Complaint  Patient presents with  . Acute Visit    Anticoagulation management and follow up on illness.    HPI: Patient is a 86 y.o. female who is being seen for INR check.  Today the patient's INR is 8.  Yesterday when I was off the patient's INR was 8.  Patient's INR is on hold.  Patient has been on 2 mg daily since 1/18 with INR from 2.0-2.1.  Past Medical History:  Diagnosis Date  . Anemia 03/02/2012  . Angina pectoris, unstable ([facility]) 03/02/2012  . Anxiety   . Axillary abscess   . Bradycardia 06/13/2012  . CAD (coronary artery disease)    S/p PTCA / stenting (last cath 2004, multiple LAD stents, 2 stents in the right coronary artery all patent)  . Cancer ([facility])   . Complication of anesthesia   . Compression fracture of L2 ([facility]) 03/12/2017  . Dementia ([facility])   . Dysphagia 10/06/2012  . Dyspnea 02/03/2012  . GERD (gastroesophageal reflux disease)   . History of pulmonary embolus (PE) 03/19/2017  . HTN (hypertension)   . PMR (polymyalgia rheumatica) (HCC)   . PONV (postoperative nausea and vomiting)   . Restless leg syndrome   . VTE (venous  thromboembolism) 10/2010   DVT and PE. Started coumadin  . Weakness of both legs 06/09/2012    Past Surgical History:  Procedure Laterality Date  . BREAST LUMPECTOMY    . COLECTOMY    . ESOPHAGOGASTRODUODENOSCOPY (EGD) WITH ESOPHAGEAL DILATION  10/10/2012   Procedure: ESOPHAGOGASTRODUODENOSCOPY (EGD) WITH ESOPHAGEAL DILATION;  Surgeon: Inda Castle, MD;  Location: [facility];  Service: Endoscopy;  Laterality: N/A;  . FEMUR IM NAIL Left 12/13/2018   Procedure: INTRAMEDULLARY (IM) NAIL FEMORAL LEFT;  Surgeon: Paralee Cancel, MD;  [facility];  Service: Orthopedics;  Laterality: Left;  . heart stents  x 8  . INCISION AND DRAINAGE     bilateral axillary, non specific staff  . INCISION AND DRAINAGE ABSCESS  09/28/2012   Procedure: INCISION AND DRAINAGE ABSCESS;  Surgeon: Zenovia Jarred, MD;  Location: [facility];  Service: General;  Laterality: Bilateral;  . stent     cardiac x 8 stents.    Allergies as of 12/07/2019      Reactions   Ambien [zolpidem Tartrate] Nausea And Vomiting   Codeine    Codeine Phosphate Nausea And Vomiting   Darvocet [propoxyphene N-acetaminophen] Nausea And Vomiting   Fish Allergy    Fish-derived Products    Hydrocodone    Promethazine    Promethazine Hcl Nausea And Vomiting   Sulfamethoxazole Other (See Comments)   Pt doesn't remember reaction   Vicodin [hydrocodone-acetaminophen] Other (See Comments)   Unknown reaction   Amoxicillin Nausea And Vomiting, Rash   Penicillins Nausea And Vomiting, Rash  Medication List       Accurate as of December 07, 2019 11:59 PM. If you have any questions, ask your nurse or doctor.        STOP taking these medications   aspirin EC 81 MG tablet Stopped by: Inocencio Homes, MD     TAKE these medications   acetaminophen 325 MG tablet Commonly known as: TYLENOL Take 650 mg by mouth every 6 (six) hours as needed for mild pain, moderate pain or fever.   acetaminophen 325 MG tablet Commonly known as:  TYLENOL Take 650 mg by mouth every 6 (six) hours. Take routinely for left hip pain   ALPRAZolam 0.25 MG tablet Commonly known as: XANAX Take 1 tablet (0.25 mg total) by mouth daily.   amLODipine 10 MG tablet Commonly known as: NORVASC Take 10 mg by mouth every morning.   atorvastatin 10 MG tablet Commonly known as: LIPITOR Take 10 mg by mouth every evening.   donepezil 10 MG tablet Commonly known as: ARICEPT Take 10 mg by mouth every evening.   losartan 50 MG tablet Commonly known as: COZAAR Take 50 mg by mouth at bedtime.   nitroGLYCERIN 0.4 MG SL tablet Commonly known as: NITROSTAT Place 0.4 mg under the tongue every 5 (five) minutes as needed for chest pain.   NON FORMULARY Diet Type:  Liberalized due to poor appetite to Mech soft   NUTRITIONAL SUPPLEMENT PO Take 1 each by mouth 2 (two) times a day. Magic Cup - take with lunch and dinner   nystatin cream Commonly known as: MYCOSTATIN apply to perineal area/guteal cleft   ondansetron 4 MG tablet Commonly known as: ZOFRAN Take 4 mg by mouth every 4 (four) hours as needed for nausea.   oxybutynin 5 MG tablet Commonly known as: DITROPAN Take 5 mg by mouth 2 (two) times daily.   phytonadione 1 mg/0.5 ml Soln injection Commonly known as: VITAMIN K Inject 10 mg into the muscle once.   polyethylene glycol 17 g packet Commonly known as: MIRALAX / GLYCOLAX Take 17 g by mouth daily as needed for moderate constipation.   predniSONE 1 MG tablet Commonly known as: DELTASONE Take 1 mg by mouth daily with breakfast.   ProAir HFA 108 (90 Base) MCG/ACT inhaler Generic drug: albuterol Inhale 2 puffs into the lungs every 6 (six) hours as needed for wheezing or shortness of breath.   rOPINIRole 1 MG tablet Commonly known as: REQUIP Take 1 mg by mouth See admin instructions. 1mg  at noon, 1mg  2 hours before bed, then 1mg  at bedtime   senna-docusate 8.6-50 MG tablet Commonly known as: Senokot-S Take 1 tablet by mouth 2  (two) times daily.   Silenor 6 MG Tabs Generic drug: Doxepin HCl Take 0.5 tablets (3 mg total) by mouth at bedtime.   traMADol 50 MG tablet Commonly known as: ULTRAM Take 1 tablet (50 mg total) by mouth 2 (two) times daily.   Tums 500 MG chewable tablet Generic drug: calcium carbonate Chew 1 tablet by mouth 3 (three) times daily as needed for indigestion or heartburn.   vitamin C 500 MG tablet Commonly known as: ASCORBIC ACID Take 500 mg by mouth daily.   Vitamin D3 1.25 MG (50000 UT) Caps Take 1 capsule by mouth once a week.   zinc sulfate 220 (50 Zn) MG capsule Take 220 mg by mouth every other day.       No orders of the defined types were placed in this encounter.   Immunization History  Administered Date(s) Administered  . Influenza-Unspecified 08/01/2016, 08/11/2017, 07/02/2018, 08/02/2019  . Moderna SARS-COVID-2 Vaccination 11/01/2019, 11/29/2019  . PPD Test 03/18/2017  . Pneumococcal Conjugate-13 09/17/2017  . Pneumococcal Polysaccharide-23 04/17/2019    Social History   Tobacco Use  . Smoking status: Never Smoker  . Smokeless tobacco: Never Used  Substance Use Topics  . Alcohol use: No     Vitals:   12/07/19 1536  BP: 116/70  Pulse: 60  Resp: 17  Temp: (!) 97 F (36.1 C)   Body mass index is 24.79 kg/m.   Patient Active Problem List   Diagnosis Date Noted  . Failure to thrive in adult 01/08/2019  . Moderate protein malnutrition ([facility]) 01/08/2019  . Pressure injury of skin 12/18/2018  . Intertrochanteric fracture of left femur ([facility]) 12/11/2018  . CKD (chronic kidney disease), stage III 12/11/2018  . Depression with anxiety 12/11/2018  . Overactive bladder 09/24/2017  . Insomnia 07/31/2017  . Anxiety 06/16/2017  . Citrobacter infection 03/19/2017  . AKI (acute kidney injury) ([facility]) 03/19/2017  . History of pulmonary embolus (PE) 03/19/2017  . Elevated INR 03/19/2017  . Candidal intertrigo 03/19/2017  . Compression fracture of L2 ([facility])  03/12/2017  . Acute lower UTI 03/12/2017  . Accelerated hypertension 03/12/2017  . Leukocytosis 07/31/2015  . Acute bronchitis 07/28/2015  . Chronic anticoagulation 07/28/2015  . Intractable back pain 07/20/2015  . Back pain 07/20/2015  . Cellulitis and abscess of left buttock 04/12/2013  . Stricture and stenosis of esophagus 10/10/2012  . Dementia ([facility]) 10/06/2012  . Dysphagia 10/06/2012  . Axillary abscess - bilateral, multiple 09/27/2012  . Hypotension 08/24/2012  . Near syncope 08/24/2012  . Abscess of axilla, left 06/27/2012  . Bradycardia 06/13/2012  . Abscess 06/11/2012  . Chest pain 06/09/2012  . Cellulitis 06/09/2012  . Weakness of both legs 06/09/2012  . PMR (polymyalgia rheumatica) (HCC)   . Restless leg syndrome   . Angina pectoris, unstable ([facility]) 03/02/2012  . Acute blood loss as cause of postoperative anemia 03/02/2012  . Dyspnea 02/03/2012  . UTI (urinary tract infection) 02/03/2012  . CAD (coronary artery disease)   . HTN (hypertension)   . VTE (venous thromboembolism) 10/01/2010  . HYPERCHOLESTEROLEMIA  IIA 08/27/2009  . Hyperlipidemia 12/11/2008  . HYPERTENSION, BENIGN 12/11/2008    CMP     Component Value Date/Time   NA 143 12/03/2019 0000   K 3.7 12/03/2019 0000   CL 109 (A) 12/03/2019 0000   CO2 18 12/03/2019 0000   GLUCOSE 104 (H) 12/17/2018 0417   BUN 15 12/03/2019 0000   CREATININE 0.8 12/03/2019 0000   CREATININE 0.99 12/17/2018 0417   CALCIUM 9.1 12/03/2019 0000   PROT 6.7 03/11/2017 2031   ALBUMIN 3.4 (L) 03/11/2017 2031   AST 14 06/12/2019 0000   ALT 12 06/12/2019 0000   ALKPHOS 49 06/12/2019 0000   BILITOT 0.5 03/11/2017 2031   GFRNONAA 59.56 12/03/2019 0000   GFRAA 69.03 12/03/2019 0000   Recent Labs    12/14/18 0536 12/14/18 0536 12/15/18 0911 12/15/18 0911 12/16/18 0611 12/16/18 0611 12/17/18 0417 12/27/18 0000 11/23/19 0000 11/26/19 0000 12/03/19 0000  NA 138   < > 138   < > 136   < > 135   < > 138 136* 143  K 3.9    < > 3.4*   < > 4.0   < > 4.1   < > 3.9 5.3 3.7  CL 110   < > 108   < > 108   < >  105   < > 106 103 109*  CO2 22   < > 23   < > 22   < > 22   < > 16 18 18   GLUCOSE 144*   < > 85  --  96  --  104*  --   --   --   --   BUN 25*   < > 25*   < > 21   < > 20   < > 21 23* 15  CREATININE 1.13*   < > 0.81   < > 0.86   < > 0.99   < > 0.9 1.0 0.8  CALCIUM 8.3*   < > 7.8*   < > 7.8*   < > 7.7*   < > 8.8 8.8 9.1  MG 2.1  --  2.3  --  2.2  --   --   --   --   --   --    < > = values in this interval not displayed.   Recent Labs    06/12/19 0000  AST 14  ALT 12  ALKPHOS 49   Recent Labs    12/16/18 0611 12/16/18 0611 12/17/18 0417 12/17/18 0417 12/18/18 0408 12/27/18 0000 11/23/19 0000 11/26/19 0000 12/03/19 0000  WBC 7.4   < > 7.3   < > 8.2   < > 18.2 14.7 8.9  NEUTROABS  --   --  4.7   < > 5.3   < > 18 12 7   HGB 7.3*   < > 9.4*   < > 9.5*   < > 11.1* 9.4* 11.6*  HCT 24.5*   < > 31.3*   < > 31.4*   < > 35* 29* 36  MCV 83.3  --  83.2  --  83.3  --   --   --   --   PLT 282   < > 265   < > 257   < > 312 224 461*   < > = values in this interval not displayed.   No results for input(s): CHOL, LDLCALC, TRIG in the last 8760 hours.  Invalid input(s): HCL No results found for: [facility] Lab Results  Component Value Date   TSH 1.71 08/01/2018   Lab Results  Component Value Date   HGBA1C 5.7 08/01/2018   Lab Results  Component Value Date   CHOL 140 08/01/2018   HDL 46 08/01/2018   LDLCALC 80 08/01/2018   TRIG 82 08/01/2018   CHOLHDL 2.4 03/03/2012    Significant Diagnostic Results in last 30 days:  No results found.  Assessment and Plan  History of PE/anticoagulated on Coumadin/hyper anticoagulated -patient's INR today is 8.  She will receive vitamin K 10 mg subcu with INR daily until it comes into the mid 2 range and then I will think a new regimen for her.    Hennie Duos , MD

## 2019-12-08 ENCOUNTER — Encounter: Payer: Self-pay | Admitting: Internal Medicine

## 2019-12-09 ENCOUNTER — Encounter: Payer: Self-pay | Admitting: Internal Medicine

## 2019-12-11 ENCOUNTER — Encounter: Payer: Self-pay | Admitting: Internal Medicine

## 2019-12-11 ENCOUNTER — Non-Acute Institutional Stay (SKILLED_NURSING_FACILITY): Payer: Medicare Other | Admitting: Internal Medicine

## 2019-12-11 DIAGNOSIS — Z86711 Personal history of pulmonary embolism: Secondary | ICD-10-CM | POA: Diagnosis not present

## 2019-12-11 DIAGNOSIS — I8290 Acute embolism and thrombosis of unspecified vein: Secondary | ICD-10-CM | POA: Diagnosis not present

## 2019-12-11 DIAGNOSIS — Z7901 Long term (current) use of anticoagulants: Secondary | ICD-10-CM

## 2019-12-11 NOTE — Progress Notes (Signed)
Location:  [facility] Room Number: [phone] Place of Service:  SNF (31)  Hennie Duos, MD  Patient Care Team: Hennie Duos, MD as PCP - General (Internal Medicine)  Extended Emergency Contact Information Primary Emergency Contact: Cassata,[name] Address: [address]          [place], [place] [address] [name] of [place] Phone: [phone] Mobile Phone: [phone] Relation: Son Secondary Emergency Contact: [place] of [place] Phone: [phone] Relation: Daughter    Allergies: Ambien [zolpidem tartrate], Codeine, Codeine phosphate, Darvocet [propoxyphene n-acetaminophen], Fish allergy, Fish-derived products, Hydrocodone, Promethazine, Promethazine hcl, Sulfamethoxazole, Vicodin [hydrocodone-acetaminophen], Amoxicillin, and Penicillins  Chief Complaint  Patient presents with  . Acute Visit    HPI: Patient is 86 y.o. female who   Past Medical History:  Diagnosis Date  . Anemia 03/02/2012  . Angina pectoris, unstable ([place]) 03/02/2012  . Anxiety   . Axillary abscess   . Bradycardia 06/13/2012  . CAD (coronary artery disease)    S/p PTCA / stenting (last cath 2004, multiple LAD stents, 2 stents in the right coronary artery all patent)  . Cancer ([place])   . Complication of anesthesia   . Compression fracture of L2 ([place]) 03/12/2017  . Dementia ([place])   . Dysphagia 10/06/2012  . Dyspnea 02/03/2012  . GERD (gastroesophageal reflux disease)   . History of pulmonary embolus (PE) 03/19/2017  . HTN (hypertension)   . PMR (polymyalgia rheumatica) (HCC)   . PONV (postoperative nausea and vomiting)   . Restless leg syndrome   . 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 Started coumadin  . Weakness of both legs 06/09/2012    Past Surgical History:  Procedure Laterality Date  . BREAST LUMPECTOMY    . COLECTOMY    . ESOPHAGOGASTRODUODENOSCOPY (EGD) WITH ESOPHAGEAL DILATION  10/10/2012   Procedure:  ESOPHAGOGASTRODUODENOSCOPY (EGD) WITH ESOPHAGEAL DILATION;  Surgeon: Inda Castle, MD;  Location: [facility];  Service: Endoscopy;  Laterality: N/A;  . FEMUR IM NAIL Left 12/13/2018   Procedure: INTRAMEDULLARY (IM) NAIL FEMORAL LEFT;  Surgeon: Paralee Cancel, MD;  Location: [facility];  Service: Orthopedics;  Laterality: Left;  . heart stents  x 8  . INCISION AND DRAINAGE     bilateral axillary, non specific staff  . INCISION AND DRAINAGE ABSCESS  09/28/2012   Procedure: INCISION AND DRAINAGE ABSCESS;  Surgeon: Zenovia Jarred, MD;  Location: [facility];  Service: General;  Laterality: Bilateral;  . stent     cardiac x 8 stents.    Allergies as of 12/11/2019      Reactions   Ambien [zolpidem Tartrate] Nausea And Vomiting   Codeine    Codeine Phosphate Nausea And Vomiting   Darvocet [propoxyphene N-acetaminophen] Nausea And Vomiting   Fish Allergy    Fish-derived Products    Hydrocodone    Promethazine    Promethazine Hcl Nausea And Vomiting   Sulfamethoxazole Other (See Comments)   Pt doesn't remember reaction   Vicodin [hydrocodone-acetaminophen] Other (See Comments)   Unknown reaction   Amoxicillin Nausea And Vomiting, Rash   Penicillins Nausea And Vomiting, Rash      Medication List       Accurate as of December 11, 2019 11:59 PM. If you have any questions, ask your nurse or doctor.        STOP taking these medications   phytonadione 1 mg/0.5 ml Soln injection Commonly known as: VITAMIN K Stopped by: Inocencio Homes,  MD     TAKE these medications   acetaminophen 325 MG tablet Commonly known as: TYLENOL Take 650 mg by mouth every 6 (six) hours as needed for mild pain, moderate pain or fever.   acetaminophen 325 MG tablet Commonly known as: TYLENOL Take 650 mg by mouth every 6 (six) hours. Take routinely for left hip pain   ALPRAZolam 0.5 MG tablet Commonly known as: XANAX Take 0.25 mg by mouth daily. What changed: Another medication with the same name was removed.  Continue taking this medication, and follow the directions you see here. Changed by: Inocencio Homes, MD   amLODipine 10 MG tablet Commonly known as: NORVASC Take 10 mg by mouth every morning.   atorvastatin 10 MG tablet Commonly known as: LIPITOR Take 10 mg by mouth every evening.   donepezil 10 MG tablet Commonly known as: ARICEPT Take 10 mg by mouth every evening.   losartan 50 MG tablet Commonly known as: COZAAR Take 50 mg by mouth at bedtime.   nitroGLYCERIN 0.4 MG SL tablet Commonly known as: NITROSTAT Place 0.4 mg under the tongue every 5 (five) minutes as needed for chest pain.   NON FORMULARY Diet Type:  Liberalized due to poor appetite to Mech soft   NUTRITIONAL SUPPLEMENT PO Take 1 each by mouth 2 (two) times a day. Magic Cup - take with lunch and dinner   nystatin cream Commonly known as: MYCOSTATIN apply to perineal area/guteal cleft   ondansetron 4 MG tablet Commonly known as: ZOFRAN Take 4 mg by mouth every 4 (four) hours as needed for nausea.   oxybutynin 5 MG tablet Commonly known as: DITROPAN Take 5 mg by mouth 2 (two) times daily.   polyethylene glycol 17 g packet Commonly known as: MIRALAX / GLYCOLAX Take 17 g by mouth daily as needed for moderate constipation.   predniSONE 1 MG tablet Commonly known as: DELTASONE Take 1 mg by mouth daily with breakfast.   ProAir HFA 108 (90 Base) MCG/ACT inhaler Generic drug: albuterol Inhale 2 puffs into the lungs every 6 (six) hours as needed for wheezing or shortness of breath.   rOPINIRole 1 MG tablet Commonly known as: REQUIP Take 1 mg by mouth See admin instructions. 1mg  at noon, 1mg  2 hours before bed, then 1mg  at bedtime   senna-docusate 8.6-50 MG tablet Commonly known as: Senokot-S Take 1 tablet by mouth 2 (two) times daily.   Silenor 6 MG Tabs Generic drug: Doxepin HCl Take 0.5 tablets (3 mg total) by mouth at bedtime.   traMADol 50 MG tablet Commonly known as: ULTRAM Take 1 tablet (50  mg total) by mouth 2 (two) times daily.   Tums 500 MG chewable tablet Generic drug: calcium carbonate Chew 1 tablet by mouth 3 (three) times daily as needed for indigestion or heartburn.   vitamin C 500 MG tablet Commonly known as: ASCORBIC ACID Take 500 mg by mouth daily.   Vitamin D3 1.25 MG (50000 UT) Caps Take 1 capsule by mouth once a week.   warfarin 3 MG tablet Commonly known as: COUMADIN Take 1.5 mg by mouth daily. Give 1/2 tab   zinc sulfate 220 (50 Zn) MG capsule Take 220 mg by mouth every other day.       No orders of the defined types were placed in this encounter.   Immunization History  Administered Date(s) Administered  . Influenza-Unspecified 08/01/2016, 08/11/2017, 07/02/2018, 08/02/2019  . Moderna SARS-COVID-2 Vaccination 11/01/2019, 11/29/2019  . PPD Test 03/18/2017  . Pneumococcal Conjugate-13  09/17/2017  . Pneumococcal Polysaccharide-23 04/17/2019    Social History   Tobacco Use  . Smoking status: Never Smoker  . Smokeless tobacco: Never Used  Substance Use Topics  . Alcohol use: No     Vitals:   12/11/19 1038  BP: 117/63  Pulse: (!) 50  Resp: 18  Temp: (!) 97 F (36.1 C)   Body mass index is 24.79 kg/m.   Patient Active Problem List   Diagnosis Date Noted  . Failure to thrive in adult 01/08/2019  . Moderate protein malnutrition ([place]) 01/08/2019  . Pressure injury of skin 12/18/2018  . Intertrochanteric fracture of left femur ([place]) 12/11/2018  . CKD (chronic kidney disease), stage III 12/11/2018  . Depression with anxiety 12/11/2018  . Overactive bladder 09/24/2017  . Insomnia 07/31/2017  . Anxiety 06/16/2017  . Citrobacter infection 03/19/2017  . AKI (acute kidney injury) ([place]) 03/19/2017  . History of pulmonary embolus (PE) 03/19/2017  . Elevated INR 03/19/2017  . Candidal intertrigo 03/19/2017  . Compression fracture of L2 ([place]) 03/12/2017  . Acute lower UTI 03/12/2017  . Accelerated hypertension 03/12/2017  .  Leukocytosis 07/31/2015  . Acute bronchitis 07/28/2015  . Chronic anticoagulation 07/28/2015  . Intractable back pain 07/20/2015  . Back pain 07/20/2015  . Cellulitis and abscess of left buttock 04/12/2013  . Stricture and stenosis of esophagus 10/10/2012  . Dementia ([place]) 10/06/2012  . Dysphagia 10/06/2012  . Axillary abscess - bilateral, multiple 09/27/2012  . Hypotension 08/24/2012  . Near syncope 08/24/2012  . Abscess of axilla, left 06/27/2012  . Bradycardia 06/13/2012  . Abscess 06/11/2012  . Chest pain 06/09/2012  . Cellulitis 06/09/2012  . Weakness of both legs 06/09/2012  . PMR (polymyalgia rheumatica) (HCC)   . Restless leg syndrome   . Angina pectoris, unstable ([place]) 03/02/2012  . Acute blood loss as cause of postoperative anemia 03/02/2012  . Dyspnea 02/03/2012  . UTI (urinary tract infection) 02/03/2012  . CAD (coronary artery disease)   . HTN (hypertension)   . VTE (venous thromboembolism) 10/01/2010  . HYPERCHOLESTEROLEMIA  IIA 08/27/2009  . Hyperlipidemia 12/11/2008  . HYPERTENSION, BENIGN 12/11/2008    CMP     Component Value Date/Time   NA 143 12/03/2019 0000   K 3.7 12/03/2019 0000   CL 109 (A) 12/03/2019 0000   CO2 18 12/03/2019 0000   GLUCOSE 104 (H) 12/17/2018 0417   BUN 15 12/03/2019 0000   CREATININE 0.8 12/03/2019 0000   CREATININE 0.99 12/17/2018 0417   CALCIUM 9.1 12/03/2019 0000   PROT 6.7 03/11/2017 2031   ALBUMIN 3.4 (L) 03/11/2017 2031   AST 14 06/12/2019 0000   ALT 12 06/12/2019 0000   ALKPHOS 49 06/12/2019 0000   BILITOT 0.5 03/11/2017 2031   GFRNONAA 59.56 12/03/2019 0000   GFRAA 69.03 12/03/2019 0000   Recent Labs    12/17/18 0417 12/27/18 0000 11/23/19 0000 11/26/19 0000 12/03/19 0000  NA 135   < > 138 136* 143  K 4.1   < > 3.9 5.3 3.7  CL 105   < > 106 103 109*  CO2 22   < > 16 18 18   GLUCOSE 104*  --   --   --   --   BUN 20   < > 21 23* 15  CREATININE 0.99   < > 0.9 1.0 0.8  CALCIUM 7.7*   < > 8.8 8.8 9.1   < >  = values in this interval not displayed.   Recent Labs  06/12/19 0000  AST 14  ALT 12  ALKPHOS 49   Recent Labs    12/17/18 0417 12/17/18 0417 12/18/18 0408 12/27/18 0000 11/23/19 0000 11/26/19 0000 12/03/19 0000  WBC 7.3   < > 8.2   < > 18.2 14.7 8.9  NEUTROABS 4.7   < > 5.3   < > 18 12 7   HGB 9.4*   < > 9.5*   < > 11.1* 9.4* 11.6*  HCT 31.3*   < > 31.4*   < > 35* 29* 36  MCV 83.2  --  83.3  --   --   --   --   PLT 265   < > 257   < > 312 224 461*   < > = values in this interval not displayed.   No results for input(s): CHOL, LDLCALC, TRIG in the last 8760 hours.  Invalid input(s): HCL No results found for: [facility] Lab Results  Component Value Date   TSH 1.71 08/01/2018   Lab Results  Component Value Date   HGBA1C 5.7 08/01/2018   Lab Results  Component Value Date   CHOL 140 08/01/2018   HDL 46 08/01/2018   LDLCALC 80 08/01/2018   TRIG 82 08/01/2018   CHOLHDL 2.4 03/03/2012    Significant Diagnostic Results in last 30 days:  No results found.  Assessment and Plan  Chronic DVT and PE/anticoagulated on Coumadin-patient INR was 8, she was given vitamin K 10 mg subcu and her INR went down to 1.4; on 2/7 placed on 1.5 mg of Coumadin daily; will repeat INR on 2/12 just to make sure patient is not going out again, although it should not as patient has been on this dose before without any difficulties      Hennie Duos , MD

## 2019-12-14 ENCOUNTER — Non-Acute Institutional Stay (SKILLED_NURSING_FACILITY): Payer: Medicare Other | Admitting: Internal Medicine

## 2019-12-14 ENCOUNTER — Encounter: Payer: Self-pay | Admitting: Internal Medicine

## 2019-12-14 DIAGNOSIS — I8290 Acute embolism and thrombosis of unspecified vein: Secondary | ICD-10-CM

## 2019-12-14 DIAGNOSIS — Z86711 Personal history of pulmonary embolism: Secondary | ICD-10-CM | POA: Diagnosis not present

## 2019-12-14 NOTE — Progress Notes (Signed)
Location:  [facility] Room Number: 204-D Place of Service:  SNF (31)  [name] Duos, MD  Patient Care Team: [name] Duos, MD as PCP - General (Internal Medicine)  Extended Emergency Contact Information Primary Emergency Contact: Jepsen,[name] Address: [name]          [place], [name] [address] [name] Phone: [phone] Mobile Phone: [phone] Relation: Son Secondary Emergency Contact: [place] of [place] Phone: [phone] Relation: Daughter    Allergies: Ambien [zolpidem tartrate], Codeine, Codeine phosphate, Darvocet [propoxyphene n-acetaminophen], Fish allergy, Fish-derived products, Hydrocodone, Promethazine, Promethazine hcl, Sulfamethoxazole, Vicodin [hydrocodone-acetaminophen], Amoxicillin, and Penicillins  Chief Complaint  Patient presents with  . Anticoagulation    Coumadin management    HPI: Patient is a 86 y.o. female who who is being seen for INR check.  Patient's INR on 1 2/7 was 1.5; which is not therapeutic for , DVT, and history of PE.  Past Medical History:  Diagnosis Date  . Anemia 03/02/2012  . Angina pectoris, unstable ([place]) 03/02/2012  . Anxiety   . Axillary abscess   . Bradycardia 06/13/2012  . CAD (coronary artery disease)    S/p PTCA / stenting (last cath 2004, multiple LAD stents, 2 stents in the right coronary artery all patent)  . Cancer ([place])   . Complication of anesthesia   . Compression fracture of L2 ([place]) 03/12/2017  . Dementia ([place])   . Dysphagia 10/06/2012  . Dyspnea 02/03/2012  . GERD (gastroesophageal reflux disease)   . History of pulmonary embolus (PE) 03/19/2017  . HTN (hypertension)   . PMR (polymyalgia rheumatica) (HCC)   . PONV (postoperative nausea and vomiting)   . Restless leg syndrome   . 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 Started coumadin  . Weakness of both legs 06/09/2012    Past Surgical History:    Procedure Laterality Date  . BREAST LUMPECTOMY    . COLECTOMY    . ESOPHAGOGASTRODUODENOSCOPY (EGD) WITH ESOPHAGEAL DILATION  10/10/2012   Procedure: ESOPHAGOGASTRODUODENOSCOPY (EGD) WITH ESOPHAGEAL DILATION;  Surgeon: Inda Castle, MD;  Location: [facility];  Service: Endoscopy;  Laterality: N/A;  . FEMUR IM NAIL Left 12/13/2018   Procedure: INTRAMEDULLARY (IM) NAIL FEMORAL LEFT;  Surgeon: Paralee Cancel, MD;  [facility];  Service: Orthopedics;  Laterality: Left;  . heart stents  x 8  . INCISION AND DRAINAGE     bilateral axillary, non specific staff  . INCISION AND DRAINAGE ABSCESS  09/28/2012   Procedure: INCISION AND DRAINAGE ABSCESS;  Surgeon: Zenovia Jarred, MD;  Location: [facility];  Service: General;  Laterality: Bilateral;  . stent     cardiac x 8 stents.    Allergies as of 12/14/2019      Reactions   Ambien [zolpidem Tartrate] Nausea And Vomiting   Codeine    Codeine Phosphate Nausea And Vomiting   Darvocet [propoxyphene N-acetaminophen] Nausea And Vomiting   Fish Allergy    Fish-derived Products    Hydrocodone    Promethazine    Promethazine Hcl Nausea And Vomiting   Sulfamethoxazole Other (See Comments)   Pt doesn't remember reaction   Vicodin [hydrocodone-acetaminophen] Other (See Comments)   Unknown reaction   Amoxicillin Nausea And Vomiting, Rash   Penicillins Nausea And Vomiting, Rash      Medication List       Accurate as of December 14, 2019  3:38 PM. If you have any questions, ask  your nurse or doctor.        acetaminophen 325 MG tablet Commonly known as: TYLENOL Take 650 mg by mouth every 6 (six) hours as needed for mild pain, moderate pain or fever.   acetaminophen 325 MG tablet Commonly known as: TYLENOL Take 650 mg by mouth every 6 (six) hours. Take routinely for left hip pain   ALPRAZolam 0.5 MG tablet Commonly known as: XANAX Take 0.25 mg by mouth daily.   amLODipine 10 MG tablet Commonly known as: NORVASC Take 10 mg by mouth  every morning.   atorvastatin 10 MG tablet Commonly known as: LIPITOR Take 10 mg by mouth every evening.   donepezil 10 MG tablet Commonly known as: ARICEPT Take 10 mg by mouth every evening.   losartan 50 MG tablet Commonly known as: COZAAR Take 50 mg by mouth at bedtime.   nitroGLYCERIN 0.4 MG SL tablet Commonly known as: NITROSTAT Place 0.4 mg under the tongue every 5 (five) minutes as needed for chest pain.   NON FORMULARY Diet Type:  Liberalized due to poor appetite to Mech soft   NUTRITIONAL SUPPLEMENT PO Take 1 each by mouth 2 (two) times a day. Magic Cup - take with lunch and dinner   nystatin cream Commonly known as: MYCOSTATIN apply to perineal area/guteal cleft   ondansetron 4 MG tablet Commonly known as: ZOFRAN Take 4 mg by mouth every 4 (four) hours as needed for nausea.   oxybutynin 5 MG tablet Commonly known as: DITROPAN Take 5 mg by mouth 2 (two) times daily.   polyethylene glycol 17 g packet Commonly known as: MIRALAX / GLYCOLAX Take 17 g by mouth daily as needed for moderate constipation.   predniSONE 1 MG tablet Commonly known as: DELTASONE Take 1 mg by mouth daily with breakfast.   ProAir HFA 108 (90 Base) MCG/ACT inhaler Generic drug: albuterol Inhale 2 puffs into the lungs every 6 (six) hours as needed for wheezing or shortness of breath.   rOPINIRole 1 MG tablet Commonly known as: REQUIP Take 1 mg by mouth See admin instructions. 1mg  at noon, 1mg  2 hours before bed, then 1mg  at bedtime   senna-docusate 8.6-50 MG tablet Commonly known as: Senokot-S Take 1 tablet by mouth 2 (two) times daily.   Silenor 6 MG Tabs Generic drug: Doxepin HCl Take 0.5 tablets (3 mg total) by mouth at bedtime.   traMADol 50 MG tablet Commonly known as: ULTRAM Take 1 tablet (50 mg total) by mouth 2 (two) times daily.   Tums 500 MG chewable tablet Generic drug: calcium carbonate Chew 1 tablet by mouth 3 (three) times daily as needed for indigestion or  heartburn.   vitamin C 500 MG tablet Commonly known as: ASCORBIC ACID Take 500 mg by mouth daily.   Vitamin D3 1.25 MG (50000 UT) Caps Take 1 capsule by mouth once a week.   warfarin 3 MG tablet Commonly known as: COUMADIN Take 1.5 mg by mouth daily. Give 1/2 tab   zinc sulfate 220 (50 Zn) MG capsule Take 220 mg by mouth every other day.       No orders of the defined types were placed in this encounter.   Immunization History  Administered Date(s) Administered  . Influenza-Unspecified 08/01/2016, 08/11/2017, 07/02/2018, 08/02/2019  . Moderna SARS-COVID-2 Vaccination 11/01/2019, 11/29/2019  . PPD Test 03/18/2017  . Pneumococcal Conjugate-13 09/17/2017  . Pneumococcal Polysaccharide-23 04/17/2019    Social History   Tobacco Use  . Smoking status: Never Smoker  . Smokeless tobacco:  Never Used  Substance Use Topics  . Alcohol use: No     Vitals:   12/14/19 1534  BP: (!) 142/75  Pulse: (!) 52  Resp: 18  Temp: (!) 97.3 F (36.3 C)   Body mass index is 24.79 kg/m.   Patient Active Problem List   Diagnosis Date Noted  . Failure to thrive in adult 01/08/2019  . Moderate protein malnutrition ([place]) 01/08/2019  . Pressure injury of skin 12/18/2018  . Intertrochanteric fracture of left femur ([place]) 12/11/2018  . CKD (chronic kidney disease), stage III 12/11/2018  . Depression with anxiety 12/11/2018  . Overactive bladder 09/24/2017  . Insomnia 07/31/2017  . Anxiety 06/16/2017  . Citrobacter infection 03/19/2017  . AKI (acute kidney injury) ([place]) 03/19/2017  . History of pulmonary embolus (PE) 03/19/2017  . Elevated INR 03/19/2017  . Candidal intertrigo 03/19/2017  . Compression fracture of L2 ([place]) 03/12/2017  . Acute lower UTI 03/12/2017  . Accelerated hypertension 03/12/2017  . Leukocytosis 07/31/2015  . Acute bronchitis 07/28/2015  . Chronic anticoagulation 07/28/2015  . Intractable back pain 07/20/2015  . Back pain 07/20/2015  . Cellulitis and  abscess of left buttock 04/12/2013  . Stricture and stenosis of esophagus 10/10/2012  . Dementia ([place]) 10/06/2012  . Dysphagia 10/06/2012  . Axillary abscess - bilateral, multiple 09/27/2012  . Hypotension 08/24/2012  . Near syncope 08/24/2012  . Abscess of axilla, left 06/27/2012  . Bradycardia 06/13/2012  . Abscess 06/11/2012  . Chest pain 06/09/2012  . Cellulitis 06/09/2012  . Weakness of both legs 06/09/2012  . PMR (polymyalgia rheumatica) (HCC)   . Restless leg syndrome   . Angina pectoris, unstable ([place]) 03/02/2012  . Acute blood loss as cause of postoperative anemia 03/02/2012  . Dyspnea 02/03/2012  . UTI (urinary tract infection) 02/03/2012  . CAD (coronary artery disease)   . HTN (hypertension)   . VTE (venous thromboembolism) 10/01/2010  . HYPERCHOLESTEROLEMIA  IIA 08/27/2009  . Hyperlipidemia 12/11/2008  . HYPERTENSION, BENIGN 12/11/2008    CMP     Component Value Date/Time   NA 143 12/03/2019 0000   K 3.7 12/03/2019 0000   CL 109 (A) 12/03/2019 0000   CO2 18 12/03/2019 0000   GLUCOSE 104 (H) 12/17/2018 0417   BUN 15 12/03/2019 0000   CREATININE 0.8 12/03/2019 0000   CREATININE 0.99 12/17/2018 0417   CALCIUM 9.1 12/03/2019 0000   PROT 6.7 03/11/2017 2031   ALBUMIN 3.4 (L) 03/11/2017 2031   AST 14 06/12/2019 0000   ALT 12 06/12/2019 0000   ALKPHOS 49 06/12/2019 0000   BILITOT 0.5 03/11/2017 2031   GFRNONAA 59.56 12/03/2019 0000   GFRAA 69.03 12/03/2019 0000   Recent Labs    12/15/18 0911 12/15/18 0911 12/16/18 [id number] 12/16/18 0611 12/17/18 0417 12/27/18 0000 11/23/19 0000 11/26/19 0000 12/03/19 0000  NA 138   < > 136   < > 135   < > 138 136* 143  K 3.4*   < > 4.0   < > 4.1   < > 3.9 5.3 3.7  CL 108   < > 108   < > 105   < > 106 103 109*  CO2 23   < > 22   < > 22   < > 16 18 18   GLUCOSE 85  --  96  --  104*  --   --   --   --   BUN 25*   < > 21   < > 20   < >  21 23* 15  CREATININE 0.81   < > 0.86   < > 0.99   < > 0.9 1.0 0.8  CALCIUM 7.8*   < >  7.8*   < > 7.7*   < > 8.8 8.8 9.1  MG 2.3  --  2.2  --   --   --   --   --   --    < > = values in this interval not displayed.   Recent Labs    06/12/19 0000  AST 14  ALT 12  ALKPHOS 49   Recent Labs    12/16/18 0611 12/16/18 0611 12/17/18 0417 12/17/18 0417 12/18/18 0408 12/27/18 0000 11/23/19 0000 11/26/19 0000 12/03/19 0000  WBC 7.4   < > 7.3   < > 8.2   < > 18.2 14.7 8.9  NEUTROABS  --   --  4.7   < > 5.3   < > 18 12 7   HGB 7.3*   < > 9.4*   < > 9.5*   < > 11.1* 9.4* 11.6*  HCT 24.5*   < > 31.3*   < > 31.4*   < > 35* 29* 36  MCV 83.3  --  83.2  --  83.3  --   --   --   --   PLT 282   < > 265   < > 257   < > 312 224 461*   < > = values in this interval not displayed.   No results for input(s): CHOL, LDLCALC, TRIG in the last 8760 hours.  Invalid input(s): HCL No results found for: [facility] Lab Results  Component Value Date   TSH 1.71 08/01/2018   Lab Results  Component Value Date   HGBA1C 5.7 08/01/2018   Lab Results  Component Value Date   CHOL 140 08/01/2018   HDL 46 08/01/2018   LDLCALC 80 08/01/2018   TRIG 82 08/01/2018   CHOLHDL 2.4 03/03/2012    Significant Diagnostic Results in last 30 days:  No results found.  Assessment and Plan  history of PE/DVT/anticoagulated on Coumadin-patient's INR is 1.5; Coumadin 1.5 mg on 2/7 through 211; start 2 mg daily on 212 with repeat INR on 12-15    [name] [name] , MD

## 2019-12-15 ENCOUNTER — Encounter: Payer: Self-pay | Admitting: Internal Medicine

## 2019-12-16 ENCOUNTER — Encounter: Payer: Self-pay | Admitting: Internal Medicine

## 2019-12-17 ENCOUNTER — Non-Acute Institutional Stay (SKILLED_NURSING_FACILITY): Payer: Medicare Other | Admitting: Internal Medicine

## 2019-12-17 ENCOUNTER — Encounter: Payer: Self-pay | Admitting: Internal Medicine

## 2019-12-17 DIAGNOSIS — Z86711 Personal history of pulmonary embolism: Secondary | ICD-10-CM | POA: Diagnosis not present

## 2019-12-17 DIAGNOSIS — Z7901 Long term (current) use of anticoagulants: Secondary | ICD-10-CM | POA: Diagnosis not present

## 2019-12-17 DIAGNOSIS — I8290 Acute embolism and thrombosis of unspecified vein: Secondary | ICD-10-CM | POA: Diagnosis not present

## 2019-12-17 NOTE — Progress Notes (Signed)
Location:  [facility] Room Number: 204-D Place of Service:  SNF (31)  Hennie Duos, MD  Patient Care Team: Hennie Duos, MD as PCP - General (Internal Medicine)  Extended Emergency Contact Information Primary Emergency Contact: Teale,[name] Address: [address]          [place], [place] [address] [name] of [place] Phone: [phone] Mobile Phone: [phone] Relation: Son Secondary Emergency Contact: [place] of [place] Phone: [phone] Relation: Daughter    Allergies: Ambien [zolpidem tartrate], Codeine, Codeine phosphate, Darvocet [propoxyphene n-acetaminophen], Fish allergy, Fish-derived products, Hydrocodone, Promethazine, Promethazine hcl, Sulfamethoxazole, Vicodin [hydrocodone-acetaminophen], Amoxicillin, and Penicillins  Chief Complaint  Patient presents with  . Anticoagulation    INR management    HPI: Patient is a 86 y.o. female History of PE/VTE who is being seen for INR check.  Today patient's INR is 1.6 on 2 mg a day from 2/12-15, was being held before that.  This is not therapeutic.  Past Medical History:  Diagnosis Date  . Anemia 03/02/2012  . Angina pectoris, unstable ([place]) 03/02/2012  . Anxiety   . Axillary abscess   . Bradycardia 06/13/2012  . CAD (coronary artery disease)    S/p PTCA / stenting (last cath 2004, multiple LAD stents, 2 stents in the right coronary artery all patent)  . Cancer ([place])   . Complication of anesthesia   . Compression fracture of L2 ([place]) 03/12/2017  . Dementia ([place])   . Dysphagia 10/06/2012  . Dyspnea 02/03/2012  . GERD (gastroesophageal reflux disease)   . History of pulmonary embolus (PE) 03/19/2017  . HTN (hypertension)   . PMR (polymyalgia rheumatica) (HCC)   . PONV (postoperative nausea and vomiting)   . Restless leg syndrome   . 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 Started coumadin  . Weakness of both legs 06/09/2012     Past Surgical History:  Procedure Laterality Date  . BREAST LUMPECTOMY    . COLECTOMY    . ESOPHAGOGASTRODUODENOSCOPY (EGD) WITH ESOPHAGEAL DILATION  10/10/2012   Procedure: ESOPHAGOGASTRODUODENOSCOPY (EGD) WITH ESOPHAGEAL DILATION;  Surgeon: Inda Castle, MD;  Location: [facility];  Service: Endoscopy;  Laterality: N/A;  . FEMUR IM NAIL Left 12/13/2018   Procedure: INTRAMEDULLARY (IM) NAIL FEMORAL LEFT;  Surgeon: Paralee Cancel, MD;  Location: [facility];  Service: Orthopedics;  Laterality: Left;  . heart stents  x 8  . INCISION AND DRAINAGE     bilateral axillary, non specific staff  . INCISION AND DRAINAGE ABSCESS  09/28/2012   Procedure: INCISION AND DRAINAGE ABSCESS;  Surgeon: Zenovia Jarred, MD;  Location: [facility];  Service: General;  Laterality: Bilateral;  . stent     cardiac x 8 stents.    Allergies as of 12/17/2019      Reactions   Ambien [zolpidem Tartrate] Nausea And Vomiting   Codeine    Codeine Phosphate Nausea And Vomiting   Darvocet [propoxyphene N-acetaminophen] Nausea And Vomiting   Fish Allergy    Fish-derived Products    Hydrocodone    Promethazine    Promethazine Hcl Nausea And Vomiting   Sulfamethoxazole Other (See Comments)   Pt doesn't remember reaction   Vicodin [hydrocodone-acetaminophen] Other (See Comments)   Unknown reaction   Amoxicillin Nausea And Vomiting, Rash   Penicillins Nausea And Vomiting, Rash      Medication List       Accurate as of December 17, 2019 11:59 PM. If you  have any questions, ask your nurse or doctor.        acetaminophen 325 MG tablet Commonly known as: TYLENOL Take 650 mg by mouth every 6 (six) hours as needed for mild pain, moderate pain or fever.   acetaminophen 325 MG tablet Commonly known as: TYLENOL Take 650 mg by mouth every 6 (six) hours. Take routinely for left hip pain   ALPRAZolam 0.5 MG tablet Commonly known as: XANAX Take 0.25 mg by mouth daily.   amLODipine 10 MG tablet Commonly known as:  NORVASC Take 10 mg by mouth every morning.   atorvastatin 10 MG tablet Commonly known as: LIPITOR Take 10 mg by mouth every evening.   donepezil 10 MG tablet Commonly known as: ARICEPT Take 10 mg by mouth every evening.   losartan 50 MG tablet Commonly known as: COZAAR Take 50 mg by mouth at bedtime.   nitroGLYCERIN 0.4 MG SL tablet Commonly known as: NITROSTAT Place 0.4 mg under the tongue every 5 (five) minutes as needed for chest pain.   NON FORMULARY Diet Type:  Liberalized due to poor appetite to Mech soft   NUTRITIONAL SUPPLEMENT PO Take 1 each by mouth 2 (two) times a day. Magic Cup - take with lunch and dinner   ondansetron 4 MG tablet Commonly known as: ZOFRAN Take 4 mg by mouth every 4 (four) hours as needed for nausea.   oxybutynin 5 MG tablet Commonly known as: DITROPAN Take 5 mg by mouth 2 (two) times daily.   polyethylene glycol 17 g packet Commonly known as: MIRALAX / GLYCOLAX Take 17 g by mouth daily as needed for moderate constipation.   predniSONE 1 MG tablet Commonly known as: DELTASONE Take 1 mg by mouth daily with breakfast.   ProAir HFA 108 (90 Base) MCG/ACT inhaler Generic drug: albuterol Inhale 2 puffs into the lungs every 6 (six) hours as needed for wheezing or shortness of breath.   rOPINIRole 1 MG tablet Commonly known as: REQUIP Take 1 mg by mouth See admin instructions. 1mg  at noon, 1mg  2 hours before bed, then 1mg  at bedtime   senna-docusate 8.6-50 MG tablet Commonly known as: Senokot-S Take 1 tablet by mouth 2 (two) times daily.   Silenor 6 MG Tabs Generic drug: Doxepin HCl Take 0.5 tablets (3 mg total) by mouth at bedtime.   traMADol 50 MG tablet Commonly known as: ULTRAM Take 1 tablet (50 mg total) by mouth 2 (two) times daily.   Tums 500 MG chewable tablet Generic drug: calcium carbonate Chew 1 tablet by mouth 3 (three) times daily as needed for indigestion or heartburn.   vitamin C 500 MG tablet Commonly known as:  ASCORBIC ACID Take 500 mg by mouth daily.   Vitamin D3 1.25 MG (50000 UT) Caps Take 1 capsule by mouth once a week.   warfarin 2 MG tablet Commonly known as: COUMADIN Take 2 mg by mouth daily at 6 PM.   zinc sulfate 220 (50 Zn) MG capsule Take 220 mg by mouth every other day.       No orders of the defined types were placed in this encounter.   Immunization History  Administered Date(s) Administered  . Influenza-Unspecified 08/01/2016, 08/11/2017, 07/02/2018, 08/02/2019  . Moderna SARS-COVID-2 Vaccination 11/01/2019, 11/29/2019  . PPD Test 03/18/2017  . Pneumococcal Conjugate-13 09/17/2017  . Pneumococcal Polysaccharide-23 04/17/2019    Social History   Tobacco Use  . Smoking status: Never Smoker  . Smokeless tobacco: Never Used  Substance Use Topics  . Alcohol  use: No     Vitals:   12/17/19 1557  BP: (!) 149/74  Pulse: (!) 57  Resp: 18  Temp: 97.6 F (36.4 C)   Body mass index is 24.79 kg/m.   Patient Active Problem List   Diagnosis Date Noted  . Failure to thrive in adult 01/08/2019  . Moderate protein malnutrition ([place]) 01/08/2019  . Pressure injury of skin 12/18/2018  . Intertrochanteric fracture of left femur ([place]) 12/11/2018  . CKD (chronic kidney disease), stage III 12/11/2018  . Depression with anxiety 12/11/2018  . Overactive bladder 09/24/2017  . Insomnia 07/31/2017  . Anxiety 06/16/2017  . Citrobacter infection 03/19/2017  . AKI (acute kidney injury) ([facility]) 03/19/2017  . History of pulmonary embolus (PE) 03/19/2017  . Elevated INR 03/19/2017  . Candidal intertrigo 03/19/2017  . Compression fracture of L2 ([place]) 03/12/2017  . Acute lower UTI 03/12/2017  . Accelerated hypertension 03/12/2017  . Leukocytosis 07/31/2015  . Acute bronchitis 07/28/2015  . Chronic anticoagulation 07/28/2015  . Intractable back pain 07/20/2015  . Back pain 07/20/2015  . Cellulitis and abscess of left buttock 04/12/2013  . Stricture and stenosis of esophagus  10/10/2012  . Dementia ([facility] [place]) 10/06/2012  . Dysphagia 10/06/2012  . Axillary abscess - bilateral, multiple 09/27/2012  . Hypotension 08/24/2012  . Near syncope 08/24/2012  . Abscess of axilla, left 06/27/2012  . Bradycardia 06/13/2012  . Abscess 06/11/2012  . Chest pain 06/09/2012  . Cellulitis 06/09/2012  . Weakness of both legs 06/09/2012  . PMR (polymyalgia rheumatica) (HCC)   . Restless leg syndrome   . Angina pectoris, unstable ([place]) 03/02/2012  . Acute blood loss as cause of postoperative anemia 03/02/2012  . Dyspnea 02/03/2012  . UTI (urinary tract infection) 02/03/2012  . CAD (coronary artery disease)   . HTN (hypertension)   . VTE (venous thromboembolism) 10/01/2010  . HYPERCHOLESTEROLEMIA  IIA 08/27/2009  . Hyperlipidemia 12/11/2008  . HYPERTENSION, BENIGN 12/11/2008    CMP     Component Value Date/Time   NA 143 12/03/2019 0000   K 3.7 12/03/2019 0000   CL 109 (A) 12/03/2019 0000   CO2 18 12/03/2019 0000   GLUCOSE 104 (H) 12/17/2018 0417   BUN 15 12/03/2019 0000   CREATININE 0.8 12/03/2019 0000   CREATININE 0.99 12/17/2018 0417   CALCIUM 9.1 12/03/2019 0000   PROT 6.7 03/11/2017 2031   ALBUMIN 3.4 (L) 03/11/2017 2031   AST 14 06/12/2019 0000   ALT 12 06/12/2019 0000   ALKPHOS 49 06/12/2019 0000   BILITOT 0.5 03/11/2017 2031   GFRNONAA 59.56 12/03/2019 0000   GFRAA 69.03 12/03/2019 0000   Recent Labs    11/23/19 0000 11/26/19 0000 12/03/19 0000  NA 138 136* 143  K 3.9 5.3 3.7  CL 106 103 109*  CO2 16 18 18   BUN 21 23* 15  CREATININE 0.9 1.0 0.8  CALCIUM 8.8 8.8 9.1   Recent Labs    06/12/19 0000  AST 14  ALT 12  ALKPHOS 49   Recent Labs    11/23/19 0000 11/26/19 0000 12/03/19 0000  WBC 18.2 14.7 8.9  NEUTROABS 18 12 7   HGB 11.1* 9.4* 11.6*  HCT 35* 29* 36  PLT 312 224 461*   No results for input(s): CHOL, LDLCALC, TRIG in the last 8760 hours.  Invalid input(s): HCL No results found for: [facility] Lab Results  Component Value  Date   TSH 1.71 08/01/2018   Lab Results  Component Value Date   HGBA1C 5.7 08/01/2018  Lab Results  Component Value Date   CHOL 140 08/01/2018   HDL 46 08/01/2018   LDLCALC 80 08/01/2018   TRIG 82 08/01/2018   CHOLHDL 2.4 03/03/2012    Significant Diagnostic Results in last 30 days:  No results found.  Assessment and Plan  History of PE/DVT/anticoagulated on Coumadin-plan to continue 2 mg daily until Friday evening and entire week of 2 mg daily to look at; repeat INR on Friday 219     Hennie Duos , MD

## 2019-12-21 ENCOUNTER — Non-Acute Institutional Stay (SKILLED_NURSING_FACILITY): Payer: Medicare Other | Admitting: Internal Medicine

## 2019-12-21 ENCOUNTER — Encounter: Payer: Self-pay | Admitting: Internal Medicine

## 2019-12-21 DIAGNOSIS — I8290 Acute embolism and thrombosis of unspecified vein: Secondary | ICD-10-CM | POA: Diagnosis not present

## 2019-12-21 DIAGNOSIS — Z7901 Long term (current) use of anticoagulants: Secondary | ICD-10-CM | POA: Diagnosis not present

## 2019-12-21 DIAGNOSIS — Z86711 Personal history of pulmonary embolism: Secondary | ICD-10-CM | POA: Diagnosis not present

## 2019-12-21 NOTE — Progress Notes (Deleted)
Location:  [facility] Room Number: 204-D Place of Service:  SNF (31)  Hennie Duos, MD  Patient Care Team: Hennie Duos, MD as PCP - General (Internal Medicine)  Extended Emergency Contact Information Primary Emergency Contact: Naim,[name] Address: [address]          [place], [place] [address] [name] of [place] Phone: [phone] Mobile Phone: [phone] Relation: Son Secondary Emergency Contact: [place] of [place] Phone: [phone] Relation: Daughter    Allergies: Ambien [zolpidem tartrate], Codeine, Codeine phosphate, Darvocet [propoxyphene n-acetaminophen], Fish allergy, Fish-derived products, Hydrocodone, Promethazine, Promethazine hcl, Sulfamethoxazole, Vicodin [hydrocodone-acetaminophen], Amoxicillin, and Penicillins  Chief Complaint  Patient presents with  . Anticoagulation    INR management    HPI: Patient is a 86 y.o. female who   Past Medical History:  Diagnosis Date  . Anemia 03/02/2012  . Angina pectoris, unstable ([facility]) 03/02/2012  . Anxiety   . Axillary abscess   . Bradycardia 06/13/2012  . CAD (coronary artery disease)    S/p PTCA / stenting (last cath 2004, multiple LAD stents, 2 stents in the right coronary artery all patent)  . Cancer ([place])   . Complication of anesthesia   . Compression fracture of L2 ([place]) 03/12/2017  . Dementia ([facility])   . Dysphagia 10/06/2012  . Dyspnea 02/03/2012  . GERD (gastroesophageal reflux disease)   . History of pulmonary embolus (PE) 03/19/2017  . HTN (hypertension)   . PMR (polymyalgia rheumatica) (HCC)   . PONV (postoperative nausea and vomiting)   . Restless leg syndrome   . 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 Started coumadin  . Weakness of both legs 06/09/2012    Past Surgical History:  Procedure Laterality Date  . BREAST LUMPECTOMY    . COLECTOMY    . ESOPHAGOGASTRODUODENOSCOPY (EGD) WITH ESOPHAGEAL DILATION   10/10/2012   Procedure: ESOPHAGOGASTRODUODENOSCOPY (EGD) WITH ESOPHAGEAL DILATION;  Surgeon: Inda Castle, MD;  Location: [facility];  Service: Endoscopy;  Laterality: N/A;  . FEMUR IM NAIL Left 12/13/2018   Procedure: INTRAMEDULLARY (IM) NAIL FEMORAL LEFT;  Surgeon: Paralee Cancel, MD;  [facility];  Service: Orthopedics;  Laterality: Left;  . heart stents  x 8  . INCISION AND DRAINAGE     bilateral axillary, non specific staff  . INCISION AND DRAINAGE ABSCESS  09/28/2012   Procedure: INCISION AND DRAINAGE ABSCESS;  Surgeon: Zenovia Jarred, MD;  Location: [facility];  Service: General;  Laterality: Bilateral;  . stent     cardiac x 8 stents.    Allergies as of 12/21/2019      Reactions   Ambien [zolpidem Tartrate] Nausea And Vomiting   Codeine    Codeine Phosphate Nausea And Vomiting   Darvocet [propoxyphene N-acetaminophen] Nausea And Vomiting   Fish Allergy    Fish-derived Products    Hydrocodone    Promethazine    Promethazine Hcl Nausea And Vomiting   Sulfamethoxazole Other (See Comments)   Pt doesn't remember reaction   Vicodin [hydrocodone-acetaminophen] Other (See Comments)   Unknown reaction   Amoxicillin Nausea And Vomiting, Rash   Penicillins Nausea And Vomiting, Rash      Medication List       Accurate as of December 21, 2019  4:30 PM. If you have any questions, ask your nurse or doctor.        acetaminophen 325 MG tablet Commonly known as: TYLENOL Take 650 mg by mouth every 6 (  six) hours as needed for mild pain, moderate pain or fever.   acetaminophen 325 MG tablet Commonly known as: TYLENOL Take 650 mg by mouth every 6 (six) hours. Take routinely for left hip pain   ALPRAZolam 0.5 MG tablet Commonly known as: XANAX Take 0.25 mg by mouth daily.   amLODipine 10 MG tablet Commonly known as: NORVASC Take 10 mg by mouth every morning.   atorvastatin 10 MG tablet Commonly known as: LIPITOR Take 10 mg by mouth every evening.   donepezil 10 MG  tablet Commonly known as: ARICEPT Take 10 mg by mouth every evening.   losartan 50 MG tablet Commonly known as: COZAAR Take 50 mg by mouth at bedtime.   nitroGLYCERIN 0.4 MG SL tablet Commonly known as: NITROSTAT Place 0.4 mg under the tongue every 5 (five) minutes as needed for chest pain.   NON FORMULARY Diet Type:  Liberalized due to poor appetite to Mech soft   NUTRITIONAL SUPPLEMENT PO Take 1 each by mouth 2 (two) times a day. Magic Cup - take with lunch and dinner   ondansetron 4 MG tablet Commonly known as: ZOFRAN Take 4 mg by mouth every 4 (four) hours as needed for nausea.   oxybutynin 5 MG tablet Commonly known as: DITROPAN Take 5 mg by mouth 2 (two) times daily.   polyethylene glycol 17 g packet Commonly known as: MIRALAX / GLYCOLAX Take 17 g by mouth daily as needed for moderate constipation.   predniSONE 1 MG tablet Commonly known as: DELTASONE Take 1 mg by mouth daily with breakfast.   ProAir HFA 108 (90 Base) MCG/ACT inhaler Generic drug: albuterol Inhale 2 puffs into the lungs every 6 (six) hours as needed for wheezing or shortness of breath.   rOPINIRole 1 MG tablet Commonly known as: REQUIP Take 1 mg by mouth See admin instructions. 1mg  at noon, 1mg  2 hours before bed, then 1mg  at bedtime   senna-docusate 8.6-50 MG tablet Commonly known as: Senokot-S Take 1 tablet by mouth 2 (two) times daily.   Silenor 6 MG Tabs Generic drug: Doxepin HCl Take 0.5 tablets (3 mg total) by mouth at bedtime.   traMADol 50 MG tablet Commonly known as: ULTRAM Take 1 tablet (50 mg total) by mouth 2 (two) times daily.   Tums 500 MG chewable tablet Generic drug: calcium carbonate Chew 1 tablet by mouth 3 (three) times daily as needed for indigestion or heartburn.   vitamin C 500 MG tablet Commonly known as: ASCORBIC ACID Take 500 mg by mouth daily.   Vitamin D3 1.25 MG (50000 UT) Caps Take 1 capsule by mouth once a week.   warfarin 2 MG tablet Commonly known  as: COUMADIN Take 2 mg by mouth See admin instructions. Give 1 tablet Sun-Tue-Thur-Fri-Sat   warfarin 3 MG tablet Commonly known as: COUMADIN Take 1.5 mg by mouth See admin instructions. Take 0.5 tablet to = 1.5 mg on Mondays and Wednesdays.   zinc sulfate 220 (50 Zn) MG capsule Take 220 mg by mouth every other day.       No orders of the defined types were placed in this encounter.   Immunization History  Administered Date(s) Administered  . Influenza-Unspecified 08/01/2016, 08/11/2017, 07/02/2018, 08/02/2019  . Moderna SARS-COVID-2 Vaccination 11/01/2019, 11/29/2019  . PPD Test 03/18/2017  . Pneumococcal Conjugate-13 09/17/2017  . Pneumococcal Polysaccharide-23 04/17/2019    Social History   Tobacco Use  . Smoking status: Never Smoker  . Smokeless tobacco: Never Used  Substance Use Topics  .  Alcohol use: No    Review of Systems  DATA OBTAINED: from patient, nurse, medical record, family member GENERAL:  no fevers, fatigue, appetite changes SKIN: No itching, rash HEENT: No complaint RESPIRATORY: No cough, wheezing, SOB CARDIAC: No chest pain, palpitations, lower extremity edema  GI: No abdominal pain, No N/V/D or constipation, No heartburn or reflux  GU: No dysuria, frequency or urgency, or incontinence  MUSCULOSKELETAL: No unrelieved bone/joint pain NEUROLOGIC: No headache, dizziness  PSYCHIATRIC: No overt anxiety or sadness  Vitals:   12/21/19 1611  BP: 130/67  Pulse: 78  Resp: 19  Temp: 98.3 F (36.8 C)   Body mass index is 24.73 kg/m. Physical Exam  GENERAL APPEARANCE: Alert, conversant, No acute distress  SKIN: No diaphoresis rash HEENT: Unremarkable RESPIRATORY: Breathing is even, unlabored. Lung sounds are clear   CARDIOVASCULAR: Heart RRR no murmurs, rubs or gallops. No peripheral edema  GASTROINTESTINAL: Abdomen is soft, non-tender, not distended w/ normal bowel sounds.  GENITOURINARY: Bladder non tender, not distended  MUSCULOSKELETAL: No  abnormal joints or musculature NEUROLOGIC: Cranial nerves 2-12 grossly intact. Moves all extremities PSYCHIATRIC: Mood and affect appropriate to situation, no behavioral issues  Patient Active Problem List   Diagnosis Date Noted  . Failure to thrive in adult 01/08/2019  . Moderate protein malnutrition ([place]) 01/08/2019  . Pressure injury of skin 12/18/2018  . Intertrochanteric fracture of left femur ([place]) 12/11/2018  . CKD (chronic kidney disease), stage III 12/11/2018  . Depression with anxiety 12/11/2018  . Overactive bladder 09/24/2017  . Insomnia 07/31/2017  . Anxiety 06/16/2017  . Citrobacter infection 03/19/2017  . AKI (acute kidney injury) ([place]) 03/19/2017  . History of pulmonary embolus (PE) 03/19/2017  . Elevated INR 03/19/2017  . Candidal intertrigo 03/19/2017  . Compression fracture of L2 ([place]) 03/12/2017  . Acute lower UTI 03/12/2017  . Accelerated hypertension 03/12/2017  . Leukocytosis 07/31/2015  . Acute bronchitis 07/28/2015  . Chronic anticoagulation 07/28/2015  . Intractable back pain 07/20/2015  . Back pain 07/20/2015  . Cellulitis and abscess of left buttock 04/12/2013  . Stricture and stenosis of esophagus 10/10/2012  . Dementia ([place]) 10/06/2012  . Dysphagia 10/06/2012  . Axillary abscess - bilateral, multiple 09/27/2012  . Hypotension 08/24/2012  . Near syncope 08/24/2012  . Abscess of axilla, left 06/27/2012  . Bradycardia 06/13/2012  . Abscess 06/11/2012  . Chest pain 06/09/2012  . Cellulitis 06/09/2012  . Weakness of both legs 06/09/2012  . PMR (polymyalgia rheumatica) (HCC)   . Restless leg syndrome   . Angina pectoris, unstable ([place]) 03/02/2012  . Acute blood loss as cause of postoperative anemia 03/02/2012  . Dyspnea 02/03/2012  . UTI (urinary tract infection) 02/03/2012  . CAD (coronary artery disease)   . HTN (hypertension)   . VTE (venous thromboembolism) 10/01/2010  . HYPERCHOLESTEROLEMIA  IIA 08/27/2009  . Hyperlipidemia 12/11/2008    . HYPERTENSION, BENIGN 12/11/2008    CMP     Component Value Date/Time   NA 143 12/03/2019 0000   K 3.7 12/03/2019 0000   CL 109 (A) 12/03/2019 0000   CO2 18 12/03/2019 0000   GLUCOSE 104 (H) 12/17/2018 0417   BUN 15 12/03/2019 0000   CREATININE 0.8 12/03/2019 0000   CREATININE 0.99 12/17/2018 0417   CALCIUM 9.1 12/03/2019 0000   PROT 6.7 03/11/2017 2031   ALBUMIN 3.4 (L) 03/11/2017 2031   AST 14 06/12/2019 0000   ALT 12 06/12/2019 0000   ALKPHOS 49 06/12/2019 0000   BILITOT 0.5 03/11/2017 2031  GFRNONAA 59.56 12/03/2019 0000   GFRAA 69.03 12/03/2019 0000   Recent Labs    11/23/19 0000 11/26/19 0000 12/03/19 0000  NA 138 136* 143  K 3.9 5.3 3.7  CL 106 103 109*  CO2 16 18 18   BUN 21 23* 15  CREATININE 0.9 1.0 0.8  CALCIUM 8.8 8.8 9.1   Recent Labs    06/12/19 0000  AST 14  ALT 12  ALKPHOS 49   Recent Labs    11/23/19 0000 11/26/19 0000 12/03/19 0000  WBC 18.2 14.7 8.9  NEUTROABS 18 12 7   HGB 11.1* 9.4* 11.6*  HCT 35* 29* 36  PLT 312 224 461*   No results for input(s): CHOL, LDLCALC, TRIG in the last 8760 hours.  Invalid input(s): HCL No results found for: [facility] Lab Results  Component Value Date   TSH 1.71 08/01/2018   Lab Results  Component Value Date   HGBA1C 5.7 08/01/2018   Lab Results  Component Value Date   CHOL 140 08/01/2018   HDL 46 08/01/2018   LDLCALC 80 08/01/2018   TRIG 82 08/01/2018   CHOLHDL 2.4 03/03/2012    Significant Diagnostic Results in last 30 days:  No results found.  Assessment and Plan  No problem-specific Assessment & Plan notes found for this encounter.   Labs/tests ordered:    Hennie Duos, MD

## 2019-12-21 NOTE — Progress Notes (Signed)
Location:  [facility] Room Number: 204-D Place of Service:  SNF (31)  [name] Duos, MD  Patient Care Team: [name] Duos, MD as PCP - General (Internal Medicine)  Extended Emergency Contact Information Primary Emergency Contact: [name],[name] Address: [place]          [name], [name] [address] [name] of [place] Phone: [phone] Mobile Phone: [phone] Relation: Son Secondary Emergency Contact: Horace of [place] Phone: [phone] Relation: Daughter    Allergies: Ambien [zolpidem tartrate], Codeine, Codeine phosphate, Darvocet [propoxyphene n-acetaminophen], Fish allergy, Fish-derived products, Hydrocodone, Promethazine, Promethazine hcl, Sulfamethoxazole, Vicodin [hydrocodone-acetaminophen], Amoxicillin, and Penicillins  Chief Complaint  Patient presents with  . Anticoagulation    INR management    HPI: Patient is a 86 y.o. female with history of PE/VTE who is on Coumadin who is getting an INR check today.  Today patient's INR is 2.9 after having been on 2 mg daily for 7 days.  Past Medical History:  Diagnosis Date  . Anemia 03/02/2012  . Angina pectoris, unstable ([place]) 03/02/2012  . Anxiety   . Axillary abscess   . Bradycardia 06/13/2012  . CAD (coronary artery disease)    S/p PTCA / stenting (last cath 2004, multiple LAD stents, 2 stents in the right coronary artery all patent)  . Cancer ([place])   . Complication of anesthesia   . Compression fracture of L2 ([place]) 03/12/2017  . Dementia ([place])   . Dysphagia 10/06/2012  . Dyspnea 02/03/2012  . GERD (gastroesophageal reflux disease)   . History of pulmonary embolus (PE) 03/19/2017  . HTN (hypertension)   . PMR (polymyalgia rheumatica) (HCC)   . PONV (postoperative nausea and vomiting)   . Restless leg syndrome   . 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 Started coumadin  . Weakness of both legs 06/09/2012    Past  Surgical History:  Procedure Laterality Date  . BREAST LUMPECTOMY    . COLECTOMY    . ESOPHAGOGASTRODUODENOSCOPY (EGD) WITH ESOPHAGEAL DILATION  10/10/2012   Procedure: ESOPHAGOGASTRODUODENOSCOPY (EGD) WITH ESOPHAGEAL DILATION;  Surgeon: Inda Castle, MD;  Location: [facility];  Service: Endoscopy;  Laterality: N/A;  . FEMUR IM NAIL Left 12/13/2018   Procedure: INTRAMEDULLARY (IM) NAIL FEMORAL LEFT;  Surgeon: Paralee Cancel, MD;  [facility];  Service: Orthopedics;  Laterality: Left;  . heart stents  x 8  . INCISION AND DRAINAGE     bilateral axillary, non specific staff  . INCISION AND DRAINAGE ABSCESS  09/28/2012   Procedure: INCISION AND DRAINAGE ABSCESS;  Surgeon: Zenovia Jarred, MD;  Location: [facility];  Service: General;  Laterality: Bilateral;  . stent     cardiac x 8 stents.    Allergies as of 12/21/2019      Reactions   Ambien [zolpidem Tartrate] Nausea And Vomiting   Codeine    Codeine Phosphate Nausea And Vomiting   Darvocet [propoxyphene N-acetaminophen] Nausea And Vomiting   Fish Allergy    Fish-derived Products    Hydrocodone    Promethazine    Promethazine Hcl Nausea And Vomiting   Sulfamethoxazole Other (See Comments)   Pt doesn't remember reaction   Vicodin [hydrocodone-acetaminophen] Other (See Comments)   Unknown reaction   Amoxicillin Nausea And Vomiting, Rash   Penicillins Nausea And Vomiting, Rash      Medication List       Accurate as of December 21, 2019  8:20 PM. If you have  any questions, ask your nurse or doctor.        acetaminophen 325 MG tablet Commonly known as: TYLENOL Take 650 mg by mouth every 6 (six) hours as needed for mild pain, moderate pain or fever.   acetaminophen 325 MG tablet Commonly known as: TYLENOL Take 650 mg by mouth every 6 (six) hours. Take routinely for left hip pain   ALPRAZolam 0.5 MG tablet Commonly known as: XANAX Take 0.25 mg by mouth daily.   amLODipine 10 MG tablet Commonly known as: NORVASC Take  10 mg by mouth every morning.   atorvastatin 10 MG tablet Commonly known as: LIPITOR Take 10 mg by mouth every evening.   donepezil 10 MG tablet Commonly known as: ARICEPT Take 10 mg by mouth every evening.   losartan 50 MG tablet Commonly known as: COZAAR Take 50 mg by mouth at bedtime.   nitroGLYCERIN 0.4 MG SL tablet Commonly known as: NITROSTAT Place 0.4 mg under the tongue every 5 (five) minutes as needed for chest pain.   NON FORMULARY Diet Type:  Liberalized due to poor appetite to Mech soft   NUTRITIONAL SUPPLEMENT PO Take 1 each by mouth 2 (two) times a day. Magic Cup - take with lunch and dinner   ondansetron 4 MG tablet Commonly known as: ZOFRAN Take 4 mg by mouth every 4 (four) hours as needed for nausea.   oxybutynin 5 MG tablet Commonly known as: DITROPAN Take 5 mg by mouth 2 (two) times daily.   polyethylene glycol 17 g packet Commonly known as: MIRALAX / GLYCOLAX Take 17 g by mouth daily as needed for moderate constipation.   predniSONE 1 MG tablet Commonly known as: DELTASONE Take 1 mg by mouth daily with breakfast.   ProAir HFA 108 (90 Base) MCG/ACT inhaler Generic drug: albuterol Inhale 2 puffs into the lungs every 6 (six) hours as needed for wheezing or shortness of breath.   rOPINIRole 1 MG tablet Commonly known as: REQUIP Take 1 mg by mouth See admin instructions. 1mg  at noon, 1mg  2 hours before bed, then 1mg  at bedtime   senna-docusate 8.6-50 MG tablet Commonly known as: Senokot-S Take 1 tablet by mouth 2 (two) times daily.   Silenor 6 MG Tabs Generic drug: Doxepin HCl Take 0.5 tablets (3 mg total) by mouth at bedtime.   traMADol 50 MG tablet Commonly known as: ULTRAM Take 1 tablet (50 mg total) by mouth 2 (two) times daily.   Tums 500 MG chewable tablet Generic drug: calcium carbonate Chew 1 tablet by mouth 3 (three) times daily as needed for indigestion or heartburn.   vitamin C 500 MG tablet Commonly known as: ASCORBIC  ACID Take 500 mg by mouth daily.   Vitamin D3 1.25 MG (50000 UT) Caps Take 1 capsule by mouth once a week.   warfarin 2 MG tablet Commonly known as: COUMADIN Take 2 mg by mouth See admin instructions. Give 1 tablet Sun-Tue-Thur-Fri-Sat   warfarin 3 MG tablet Commonly known as: COUMADIN Take 1.5 mg by mouth See admin instructions. Take 0.5 tablet to = 1.5 mg on Mondays and Wednesdays.   zinc sulfate 220 (50 Zn) MG capsule Take 220 mg by mouth every other day.       No orders of the defined types were placed in this encounter.   Immunization History  Administered Date(s) Administered  . Influenza-Unspecified 08/01/2016, 08/11/2017, 07/02/2018, 08/02/2019  . Moderna SARS-COVID-2 Vaccination 11/01/2019, 11/29/2019  . PPD Test 03/18/2017  . Pneumococcal Conjugate-13 09/17/2017  .  Pneumococcal Polysaccharide-23 04/17/2019    Social History   Tobacco Use  . Smoking status: Never Smoker  . Smokeless tobacco: Never Used  Substance Use Topics  . Alcohol use: No     Vitals:   12/21/19 1611  BP: 130/67  Pulse: 78  Resp: 19  Temp: 98.3 F (36.8 C)   Body mass index is 24.73 kg/m.   Patient Active Problem List   Diagnosis Date Noted  . Failure to thrive in adult 01/08/2019  . Moderate protein malnutrition ([place]) 01/08/2019  . Pressure injury of skin 12/18/2018  . Intertrochanteric fracture of left femur ([place]) 12/11/2018  . CKD (chronic kidney disease), stage III 12/11/2018  . Depression with anxiety 12/11/2018  . Overactive bladder 09/24/2017  . Insomnia 07/31/2017  . Anxiety 06/16/2017  . Citrobacter infection 03/19/2017  . AKI (acute kidney injury) ([place]) 03/19/2017  . History of pulmonary embolus (PE) 03/19/2017  . Elevated INR 03/19/2017  . Candidal intertrigo 03/19/2017  . Compression fracture of L2 ([place]) 03/12/2017  . Acute lower UTI 03/12/2017  . Accelerated hypertension 03/12/2017  . Leukocytosis 07/31/2015  . Acute bronchitis 07/28/2015  . Chronic  anticoagulation 07/28/2015  . Intractable back pain 07/20/2015  . Back pain 07/20/2015  . Cellulitis and abscess of left buttock 04/12/2013  . Stricture and stenosis of esophagus 10/10/2012  . Dementia ([place]) 10/06/2012  . Dysphagia 10/06/2012  . Axillary abscess - bilateral, multiple 09/27/2012  . Hypotension 08/24/2012  . Near syncope 08/24/2012  . Abscess of axilla, left 06/27/2012  . Bradycardia 06/13/2012  . Abscess 06/11/2012  . Chest pain 06/09/2012  . Cellulitis 06/09/2012  . Weakness of both legs 06/09/2012  . PMR (polymyalgia rheumatica) (HCC)   . Restless leg syndrome   . Angina pectoris, unstable ([place]) 03/02/2012  . Acute blood loss as cause of postoperative anemia 03/02/2012  . Dyspnea 02/03/2012  . UTI (urinary tract infection) 02/03/2012  . CAD (coronary artery disease)   . HTN (hypertension)   . VTE (venous thromboembolism) 10/01/2010  . HYPERCHOLESTEROLEMIA  IIA 08/27/2009  . Hyperlipidemia 12/11/2008  . HYPERTENSION, BENIGN 12/11/2008    CMP     Component Value Date/Time   NA 143 12/03/2019 0000   K 3.7 12/03/2019 0000   CL 109 (A) 12/03/2019 0000   CO2 18 12/03/2019 0000   GLUCOSE 104 (H) 12/17/2018 0417   BUN 15 12/03/2019 0000   CREATININE 0.8 12/03/2019 0000   CREATININE 0.99 12/17/2018 0417   CALCIUM 9.1 12/03/2019 0000   PROT 6.7 03/11/2017 2031   ALBUMIN 3.4 (L) 03/11/2017 2031   AST 14 06/12/2019 0000   ALT 12 06/12/2019 0000   ALKPHOS 49 06/12/2019 0000   BILITOT 0.5 03/11/2017 2031   GFRNONAA 59.56 12/03/2019 0000   GFRAA 69.03 12/03/2019 0000   Recent Labs    11/23/19 0000 11/26/19 0000 12/03/19 0000  NA 138 136* 143  K 3.9 5.3 3.7  CL 106 103 109*  CO2 16 18 18   BUN 21 23* 15  CREATININE 0.9 1.0 0.8  CALCIUM 8.8 8.8 9.1   Recent Labs    06/12/19 0000  AST 14  ALT 12  ALKPHOS 49   Recent Labs    11/23/19 0000 11/26/19 0000 12/03/19 0000  WBC 18.2 14.7 8.9  NEUTROABS 18 12 7   HGB 11.1* 9.4* 11.6*  HCT 35* 29* 36   PLT 312 224 461*   No results for input(s): CHOL, LDLCALC, TRIG in the last 8760 hours.  Invalid input(s): HCL No  results found for: [facility] Lab Results  Component Value Date   TSH 1.71 08/01/2018   Lab Results  Component Value Date   HGBA1C 5.7 08/01/2018   Lab Results  Component Value Date   CHOL 140 08/01/2018   HDL 46 08/01/2018   LDLCALC 80 08/01/2018   TRIG 82 08/01/2018   CHOLHDL 2.4 03/03/2012    Significant Diagnostic Results in last 30 days:  No results found.  Assessment and Plan  History of PE/DVT/anticoagulated on Coumadin-INR is 2.9 which is therapeutic but a little bit high, on the.  Patient will go out again.  Plan to decrease Coumadin to 1.5 mg Monday and Wednesday and 2 mg on every other day with repeat INR in 1 week  No problem-specific Assessment & Plan notes found for this encounter.   Labs/tests ordered:    [name] [name] , MD

## 2019-12-22 ENCOUNTER — Encounter: Payer: Self-pay | Admitting: Internal Medicine

## 2019-12-26 DIAGNOSIS — E559 Vitamin D deficiency, unspecified: Secondary | ICD-10-CM | POA: Diagnosis not present

## 2019-12-28 ENCOUNTER — Non-Acute Institutional Stay (SKILLED_NURSING_FACILITY): Payer: Medicare Other | Admitting: Internal Medicine

## 2019-12-28 DIAGNOSIS — I8290 Acute embolism and thrombosis of unspecified vein: Secondary | ICD-10-CM

## 2019-12-28 DIAGNOSIS — Z7901 Long term (current) use of anticoagulants: Secondary | ICD-10-CM

## 2019-12-28 DIAGNOSIS — Z86711 Personal history of pulmonary embolism: Secondary | ICD-10-CM

## 2019-12-28 LAB — VITAMIN D 25 HYDROXY (VIT D DEFICIENCY, FRACTURES): Vit D, 25-Hydroxy: 24

## 2019-12-29 ENCOUNTER — Encounter: Payer: Self-pay | Admitting: Internal Medicine

## 2019-12-29 NOTE — Progress Notes (Signed)
Location:  [facility] Room Number: 204-D Place of Service:  SNF (31)  Hennie Duos, MD  Patient Care Team: Hennie Duos, MD as PCP - General (Internal Medicine)  Extended Emergency Contact Information Primary Emergency Contact: Clemon,[name] Address: [address]          [place], [place] [address] [name] of [place] Phone: [phone] Mobile Phone: [phone] Relation: Son Secondary Emergency Contact: [place] of [place] Phone: [phone] Relation: Daughter    Allergies: Ambien [zolpidem tartrate], Codeine, Codeine phosphate, Darvocet [propoxyphene n-acetaminophen], Fish allergy, Fish-derived products, Hydrocodone, Promethazine, Promethazine hcl, Sulfamethoxazole, Vicodin [hydrocodone-acetaminophen], Amoxicillin, and Penicillins  Chief Complaint  Patient presents with  . Anticoagulation    INR management    HPI: Patient is a 86 y.o. female who   Past Medical History:  Diagnosis Date  . Anemia 03/02/2012  . Angina pectoris, unstable ([place]) 03/02/2012  . Anxiety   . Axillary abscess   . Bradycardia 06/13/2012  . CAD (coronary artery disease)    S/p PTCA / stenting (last cath 2004, multiple LAD stents, 2 stents in the right coronary artery all patent)  . Cancer ([place])   . Complication of anesthesia   . Compression fracture of L2 ([place]) 03/12/2017  . Dementia ([place])   . Dysphagia 10/06/2012  . Dyspnea 02/03/2012  . GERD (gastroesophageal reflux disease)   . History of pulmonary embolus (PE) 03/19/2017  . HTN (hypertension)   . PMR (polymyalgia rheumatica) (HCC)   . PONV (postoperative nausea and vomiting)   . Restless leg syndrome   . 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 Started coumadin  . Weakness of both legs 06/09/2012    Past Surgical History:  Procedure Laterality Date  . BREAST LUMPECTOMY    . COLECTOMY    . ESOPHAGOGASTRODUODENOSCOPY (EGD) WITH ESOPHAGEAL DILATION   10/10/2012   Procedure: ESOPHAGOGASTRODUODENOSCOPY (EGD) WITH ESOPHAGEAL DILATION;  Surgeon: Inda Castle, MD;  Location: [facility];  Service: Endoscopy;  Laterality: N/A;  . FEMUR IM NAIL Left 12/13/2018   Procedure: INTRAMEDULLARY (IM) NAIL FEMORAL LEFT;  Surgeon: Paralee Cancel, MD;  [facility];  Service: Orthopedics;  Laterality: Left;  . heart stents  x 8  . INCISION AND DRAINAGE     bilateral axillary, non specific staff  . INCISION AND DRAINAGE ABSCESS  09/28/2012   Procedure: INCISION AND DRAINAGE ABSCESS;  Surgeon: Zenovia Jarred, MD;  Location: [facility];  Service: General;  Laterality: Bilateral;  . stent     cardiac x 8 stents.    Allergies as of 12/28/2019      Reactions   Ambien [zolpidem Tartrate] Nausea And Vomiting   Codeine    Codeine Phosphate Nausea And Vomiting   Darvocet [propoxyphene N-acetaminophen] Nausea And Vomiting   Fish Allergy    Fish-derived Products    Hydrocodone    Promethazine    Promethazine Hcl Nausea And Vomiting   Sulfamethoxazole Other (See Comments)   Pt doesn't remember reaction   Vicodin [hydrocodone-acetaminophen] Other (See Comments)   Unknown reaction   Amoxicillin Nausea And Vomiting, Rash   Penicillins Nausea And Vomiting, Rash      Medication List       Accurate as of December 28, 2019 11:59 PM. If you have any questions, ask your nurse or doctor.        acetaminophen 325 MG tablet Commonly known as: TYLENOL Take 650 mg by mouth every 6 (six)  hours as needed for mild pain, moderate pain or fever.   acetaminophen 325 MG tablet Commonly known as: TYLENOL Take 650 mg by mouth every 6 (six) hours. Take routinely for left hip pain   ALPRAZolam 0.5 MG tablet Commonly known as: XANAX Take 0.25 mg by mouth daily.   amLODipine 10 MG tablet Commonly known as: NORVASC Take 10 mg by mouth every morning.   atorvastatin 10 MG tablet Commonly known as: LIPITOR Take 10 mg by mouth every evening.   donepezil 10 MG  tablet Commonly known as: ARICEPT Take 10 mg by mouth every evening.   losartan 50 MG tablet Commonly known as: COZAAR Take 50 mg by mouth at bedtime.   nitroGLYCERIN 0.4 MG SL tablet Commonly known as: NITROSTAT Place 0.4 mg under the tongue every 5 (five) minutes as needed for chest pain.   NON FORMULARY Diet Type:  Liberalized due to poor appetite to Mech soft   NUTRITIONAL SUPPLEMENT PO Take 1 each by mouth 2 (two) times a day. Magic Cup - take with lunch and dinner   ondansetron 4 MG tablet Commonly known as: ZOFRAN Take 4 mg by mouth every 4 (four) hours as needed for nausea.   oxybutynin 5 MG tablet Commonly known as: DITROPAN Take 5 mg by mouth 2 (two) times daily.   polyethylene glycol 17 g packet Commonly known as: MIRALAX / GLYCOLAX Take 17 g by mouth daily as needed for moderate constipation.   predniSONE 1 MG tablet Commonly known as: DELTASONE Take 1 mg by mouth daily with breakfast.   ProAir HFA 108 (90 Base) MCG/ACT inhaler Generic drug: albuterol Inhale 2 puffs into the lungs every 6 (six) hours as needed for wheezing or shortness of breath.   rOPINIRole 1 MG tablet Commonly known as: REQUIP Take 1 mg by mouth See admin instructions. 1mg  at noon, 1mg  2 hours before bed, then 1mg  at bedtime   senna-docusate 8.6-50 MG tablet Commonly known as: Senokot-S Take 1 tablet by mouth 2 (two) times daily.   Silenor 6 MG Tabs Generic drug: Doxepin HCl Take 0.5 tablets (3 mg total) by mouth at bedtime.   traMADol 50 MG tablet Commonly known as: ULTRAM Take 1 tablet (50 mg total) by mouth 2 (two) times daily.   Tums 500 MG chewable tablet Generic drug: calcium carbonate Chew 1 tablet by mouth 3 (three) times daily as needed for indigestion or heartburn.   Vitamin D3 1.25 MG (50000 UT) Caps Take 1 capsule by mouth once a week.   warfarin 2 MG tablet Commonly known as: COUMADIN Take 2 mg by mouth See admin instructions. Give 1 tablet  Sun-Tue-Thur-Fri-Sat   warfarin 3 MG tablet Commonly known as: COUMADIN Take 1.5 mg by mouth See admin instructions. Take 0.5 tablet to = 1.5 mg on Mondays and Wednesdays.       No orders of the defined types were placed in this encounter.   Immunization History  Administered Date(s) Administered  . Influenza-Unspecified 08/01/2016, 08/11/2017, 07/02/2018, 08/02/2019  . Moderna SARS-COVID-2 Vaccination 11/01/2019, 11/29/2019  . PPD Test 03/18/2017  . Pneumococcal Conjugate-13 09/17/2017  . Pneumococcal Polysaccharide-23 04/17/2019    Social History   Tobacco Use  . Smoking status: Never Smoker  . Smokeless tobacco: Never Used  Substance Use Topics  . Alcohol use: No     There were no vitals filed for this visit. There is no height or weight on file to calculate BMI.   Patient Active Problem List   Diagnosis  Date Noted  . Failure to thrive in adult 01/08/2019  . Moderate protein malnutrition ([place]) 01/08/2019  . Pressure injury of skin 12/18/2018  . Intertrochanteric fracture of left femur ([place]) 12/11/2018  . CKD (chronic kidney disease), stage III 12/11/2018  . Depression with anxiety 12/11/2018  . Overactive bladder 09/24/2017  . Insomnia 07/31/2017  . Anxiety 06/16/2017  . Citrobacter infection 03/19/2017  . AKI (acute kidney injury) ([place]) 03/19/2017  . History of pulmonary embolus (PE) 03/19/2017  . Elevated INR 03/19/2017  . Candidal intertrigo 03/19/2017  . Compression fracture of L2 ([place]) 03/12/2017  . Acute lower UTI 03/12/2017  . Accelerated hypertension 03/12/2017  . Leukocytosis 07/31/2015  . Acute bronchitis 07/28/2015  . Chronic anticoagulation 07/28/2015  . Intractable back pain 07/20/2015  . Back pain 07/20/2015  . Cellulitis and abscess of left buttock 04/12/2013  . Stricture and stenosis of esophagus 10/10/2012  . Dementia ([place]) 10/06/2012  . Dysphagia 10/06/2012  . Axillary abscess - bilateral, multiple 09/27/2012  . Hypotension  08/24/2012  . Near syncope 08/24/2012  . Abscess of axilla, left 06/27/2012  . Bradycardia 06/13/2012  . Abscess 06/11/2012  . Chest pain 06/09/2012  . Cellulitis 06/09/2012  . Weakness of both legs 06/09/2012  . PMR (polymyalgia rheumatica) (HCC)   . Restless leg syndrome   . Angina pectoris, unstable ([place]) 03/02/2012  . Acute blood loss as cause of postoperative anemia 03/02/2012  . Dyspnea 02/03/2012  . UTI (urinary tract infection) 02/03/2012  . CAD (coronary artery disease)   . HTN (hypertension)   . VTE (venous thromboembolism) 10/01/2010  . HYPERCHOLESTEROLEMIA  IIA 08/27/2009  . Hyperlipidemia 12/11/2008  . HYPERTENSION, BENIGN 12/11/2008    CMP     Component Value Date/Time   NA 143 12/03/2019 0000   K 3.7 12/03/2019 0000   CL 109 (A) 12/03/2019 0000   CO2 18 12/03/2019 0000   GLUCOSE 104 (H) 12/17/2018 0417   BUN 15 12/03/2019 0000   CREATININE 0.8 12/03/2019 0000   CREATININE 0.99 12/17/2018 0417   CALCIUM 9.1 12/03/2019 0000   PROT 6.7 03/11/2017 2031   ALBUMIN 3.4 (L) 03/11/2017 2031   AST 14 06/12/2019 0000   ALT 12 06/12/2019 0000   ALKPHOS 49 06/12/2019 0000   BILITOT 0.5 03/11/2017 2031   GFRNONAA 59.56 12/03/2019 0000   GFRAA 69.03 12/03/2019 0000   Recent Labs    11/23/19 0000 11/26/19 0000 12/03/19 0000  NA 138 136* 143  K 3.9 5.3 3.7  CL 106 103 109*  CO2 16 18 18   BUN 21 23* 15  CREATININE 0.9 1.0 0.8  CALCIUM 8.8 8.8 9.1   Recent Labs    06/12/19 0000  AST 14  ALT 12  ALKPHOS 49   Recent Labs    11/23/19 0000 11/26/19 0000 12/03/19 0000  WBC 18.2 14.7 8.9  NEUTROABS 18 12 7   HGB 11.1* 9.4* 11.6*  HCT 35* 29* 36  PLT 312 224 461*   No results for input(s): CHOL, LDLCALC, TRIG in the last 8760 hours.  Invalid input(s): HCL No results found for: [facility] Lab Results  Component Value Date   TSH 1.71 08/01/2018   Lab Results  Component Value Date   HGBA1C 5.7 08/01/2018   Lab Results  Component Value Date    CHOL 140 08/01/2018   HDL 46 08/01/2018   LDLCALC 80 08/01/2018   TRIG 82 08/01/2018   CHOLHDL 2.4 03/03/2012    Significant Diagnostic Results in last 30 days:  No results  found.  Assessment and Plan  History of PE/VTE/anticoagulated on Coumadin-patient's INR is 2.2 which is therapeutic on Coumadin 1.5 mg Monday Wednesday Friday and 2 mg all other days of the week; continue current regimen and follow-up INR 1 week     Hennie Duos , MD

## 2019-12-30 ENCOUNTER — Encounter: Payer: Self-pay | Admitting: Internal Medicine

## 2020-01-03 ENCOUNTER — Non-Acute Institutional Stay (SKILLED_NURSING_FACILITY): Payer: Medicare Other | Admitting: Internal Medicine

## 2020-01-03 ENCOUNTER — Encounter: Payer: Self-pay | Admitting: Internal Medicine

## 2020-01-03 DIAGNOSIS — G301 Alzheimer's disease with late onset: Secondary | ICD-10-CM

## 2020-01-03 DIAGNOSIS — N3281 Overactive bladder: Secondary | ICD-10-CM

## 2020-01-03 DIAGNOSIS — M353 Polymyalgia rheumatica: Secondary | ICD-10-CM | POA: Diagnosis not present

## 2020-01-03 DIAGNOSIS — I10 Essential (primary) hypertension: Secondary | ICD-10-CM | POA: Diagnosis not present

## 2020-01-03 DIAGNOSIS — G2581 Restless legs syndrome: Secondary | ICD-10-CM

## 2020-01-03 DIAGNOSIS — R001 Bradycardia, unspecified: Secondary | ICD-10-CM

## 2020-01-03 DIAGNOSIS — F0280 Dementia in other diseases classified elsewhere without behavioral disturbance: Secondary | ICD-10-CM

## 2020-01-03 NOTE — Progress Notes (Signed)
Location:    [facility] Room Number: 204/D Place of Service:  SNF (31) Provider: Leda Min, MD  Patient Care Team: Hennie Duos, MD as PCP - General (Internal Medicine)  Extended Emergency Contact Information Primary Emergency Contact: Beaver,[name] Address: [place], [place] [address] [name] of [place] Phone: [phone] Mobile Phone: [phone] Relation: Son Secondary Emergency Contact: [place] of [place] Phone: [phone] Relation: Daughter  Code Status:  DNR Goals of care: Advanced Directive information Advanced Directives 01/03/2020  Does Patient Have a Medical Advance Directive? Yes  Type of Advance Directive Out of facility DNR (pink MOST or yellow form)  Does patient want to make changes to medical advance directive? No - Patient declined  Copy of [place] in Chart? -  Pre-existing out of facility DNR order (yellow form or pink MOST form) Yellow form placed in chart (order not valid for inpatient use)     Chief Complaint  Patient presents with  . Medical Management of Chronic Issues    Routine visit of medical management  Medical management of chronic medical conditions including hypertension-dementia-coronary artery disease-hyperlipidemia as well as polymyalgia rheumatica and overactive bladder.  As well as history of DVT on chronic Coumadin.    HPI:  Pt is a 86 y.o. female seen today for medical management of chronic diseases.  As noted above.  Today she appears to be at her baseline sitting in bed comfortably-working at times on her coloring book.  She does have a history of dementia but does quite well with supportive care her weight is stable at around 131 pounds nursing does not report any issues she is on Aricept 10 mg a day as well as Xanax once a day for some history of anxiety.  Several weeks ago she  did have a change in status --did appear to be transitory-her labs x-rays and urine culture were all unremarkable she fairly rapidly returned back to her baseline.  She also has a history of DVT and pulmonary embolism-is on Coumadin-INR recently was 2.2-update INR is pending.  Several weeks ago her INR was significantly elevated she did receive vitamin K and it did come down significantly.  There was no evidence of bleeding or bruising.  Regards to hypertension she does appear to have somewhat variable blood pressures appears baseline is in the [id number] systolically-she is on Cozaar 50 mg a day as well as Norvasc 10 mg a day  She also has a history of overactive bladder-pharmacy has left a note about possibly decreasing her Ditropan secondary to her advanced age and comorbidities. Nursing does not report any issues in this regards and she does not really have any complaints      Past Medical History:  Diagnosis Date  . Anemia 03/02/2012  . Angina pectoris, unstable ([place]) 03/02/2012  . Anxiety   . Axillary abscess   . Bradycardia 06/13/2012  . CAD (coronary artery disease)    S/p PTCA / stenting (last cath 2004, multiple LAD stents, 2 stents in the right coronary artery all patent)  . Cancer ([place])   . Complication of anesthesia   . Compression fracture of L2 ([facility] [place]) 03/12/2017  . Dementia ([place])   . Dysphagia 10/06/2012  . Dyspnea 02/03/2012  . GERD (gastroesophageal reflux disease)   . History of pulmonary embolus (PE) 03/19/2017  . HTN (hypertension)   .  PMR (polymyalgia rheumatica) (HCC)   . PONV (postoperative nausea and vomiting)   . Restless leg syndrome   . 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 Started coumadin  . Weakness of both legs 06/09/2012   Past Surgical History:  Procedure Laterality Date  . BREAST LUMPECTOMY    . COLECTOMY    . ESOPHAGOGASTRODUODENOSCOPY (EGD) WITH ESOPHAGEAL DILATION  10/10/2012   Procedure: ESOPHAGOGASTRODUODENOSCOPY (EGD) WITH  ESOPHAGEAL DILATION;  Surgeon: Inda Castle, MD;  Location: [facility];  Service: Endoscopy;  Laterality: N/A;  . FEMUR IM NAIL Left 12/13/2018   Procedure: INTRAMEDULLARY (IM) NAIL FEMORAL LEFT;  Surgeon: Paralee Cancel, MD;  Location: [facility];  Service: Orthopedics;  Laterality: Left;  . heart stents  x 8  . INCISION AND DRAINAGE     bilateral axillary, non specific staff  . INCISION AND DRAINAGE ABSCESS  09/28/2012   Procedure: INCISION AND DRAINAGE ABSCESS;  Surgeon: Zenovia Jarred, MD;  Location: [facility];  Service: General;  Laterality: Bilateral;  . stent     cardiac x 8 stents.    Allergies  Allergen Reactions  . Ambien [Zolpidem Tartrate] Nausea And Vomiting  . Codeine   . Codeine Phosphate Nausea And Vomiting  . Darvocet [Propoxyphene N-Acetaminophen] Nausea And Vomiting  . Fish Allergy   . Fish-Derived Products   . Hydrocodone   . Promethazine   . Promethazine Hcl Nausea And Vomiting  . Sulfamethoxazole Other (See Comments)    Pt doesn't remember reaction  . Vicodin [Hydrocodone-Acetaminophen] Other (See Comments)    Unknown reaction  . Amoxicillin Nausea And Vomiting and Rash  . Penicillins Nausea And Vomiting and Rash    Allergies as of 01/03/2020      Reactions   Ambien [zolpidem Tartrate] Nausea And Vomiting   Codeine    Codeine Phosphate Nausea And Vomiting   Darvocet [propoxyphene N-acetaminophen] Nausea And Vomiting   Fish Allergy    Fish-derived Products    Hydrocodone    Promethazine    Promethazine Hcl Nausea And Vomiting   Sulfamethoxazole Other (See Comments)   Pt doesn't remember reaction   Vicodin [hydrocodone-acetaminophen] Other (See Comments)   Unknown reaction   Amoxicillin Nausea And Vomiting, Rash   Penicillins Nausea And Vomiting, Rash      Medication List       Accurate as of January 03, 2020 11:32 AM. If you have any questions, ask your nurse or doctor.        acetaminophen 325 MG tablet Commonly known as: TYLENOL Take 650 mg by  mouth every 6 (six) hours as needed for mild pain, moderate pain or fever.   acetaminophen 325 MG tablet Commonly known as: TYLENOL Take 650 mg by mouth every 6 (six) hours. Take routinely for left hip pain   ALPRAZolam 0.5 MG tablet Commonly known as: XANAX Take 0.25 mg by mouth daily.   amLODipine 10 MG tablet Commonly known as: NORVASC Take 10 mg by mouth every morning.   atorvastatin 10 MG tablet Commonly known as: LIPITOR Take 10 mg by mouth every evening.   donepezil 10 MG tablet Commonly known as: ARICEPT Take 10 mg by mouth every evening.   losartan 50 MG tablet Commonly known as: COZAAR Take 50 mg by mouth at bedtime.   nitroGLYCERIN 0.4 MG SL tablet Commonly known as: NITROSTAT Place 0.4 mg under the tongue every 5 (five) minutes as needed for chest pain.   NON FORMULARY Diet Type:  Liberalized due to poor appetite  to Mech soft   NUTRITIONAL SUPPLEMENT PO Take 1 each by mouth 2 (two) times a day. Magic Cup - take with lunch and dinner   ondansetron 4 MG tablet Commonly known as: ZOFRAN Take 4 mg by mouth every 4 (four) hours as needed for nausea.   oxybutynin 5 MG tablet Commonly known as: DITROPAN Take 5 mg by mouth 2 (two) times daily.   polyethylene glycol 17 g packet Commonly known as: MIRALAX / GLYCOLAX Take 17 g by mouth daily as needed for moderate constipation.   predniSONE 1 MG tablet Commonly known as: DELTASONE Take 1 mg by mouth daily with breakfast.   ProAir HFA 108 (90 Base) MCG/ACT inhaler Generic drug: albuterol Inhale 2 puffs into the lungs every 6 (six) hours as needed for wheezing or shortness of breath.   rOPINIRole 1 MG tablet Commonly known as: REQUIP Take 1 mg by mouth See admin instructions. 1mg  at noon, 1mg  2 hours before bed, then 1mg  at bedtime   senna-docusate 8.6-50 MG tablet Commonly known as: Senokot-S Take 1 tablet by mouth 2 (two) times daily.   Silenor 6 MG Tabs Generic drug: Doxepin HCl Take 0.5 tablets (3  mg total) by mouth at bedtime.   traMADol 50 MG tablet Commonly known as: ULTRAM Take 1 tablet (50 mg total) by mouth 2 (two) times daily.   Tums 500 MG chewable tablet Generic drug: calcium carbonate Chew 1 tablet by mouth 3 (three) times daily as needed for indigestion or heartburn.   Vitamin D3 1.25 MG (50000 UT) Caps Take 1 capsule by mouth once a week.   warfarin 2 MG tablet Commonly known as: COUMADIN Take 2 mg by mouth. Give every Sun,Tues,Thurs,Fri,Sat   warfarin 3 MG tablet Commonly known as: COUMADIN Take 1.5 mg by mouth See admin instructions. Take 0.5 tablet to = 1.5 mg on Mondays and Wednesdays.       Review of Systems   This is limited secondary to dementia.  In general no complaints of fever or chills.  Skin does not complain of rashes itching increased bruising or bleeding.  Head ears eyes nose mouth and throat does not complain of visual changes sore throat.  Respiratory no complaints of shortness of breath or cough.  Cardiac does not complain of chest pain or increased edema from baseline.  GI does not complain of abdominal discomfort nausea vomiting diarrhea constipation.  GU no complaints of dysuria she does have a history of overactive bladder.  Musculoskeletal is not complaining of joint pain.  Neurologic does not complain of dizziness headache numbness or syncope.  Psych does have a history of dementia and anxiety-both appear to be stable with no signs of acute depression or uncontrolled anxiety  Immunization History  Administered Date(s) Administered  . Influenza-Unspecified 08/01/2016, 08/11/2017, 07/02/2018, 08/02/2019  . Moderna SARS-COVID-2 Vaccination 11/01/2019, 11/29/2019  . PPD Test 03/18/2017  . Pneumococcal Conjugate-13 09/17/2017  . Pneumococcal Polysaccharide-23 04/17/2019   Pertinent  Health Maintenance Due  Topic Date Due  . INFLUENZA VACCINE  Completed  . DEXA SCAN  Completed  . PNA vac Low Risk Adult  Completed    Fall Risk  03/14/2018  Falls in the past year? No   Functional Status Survey:    Vitals:   01/03/20 1051  BP: 135/61  Pulse: (!) 54  Resp: 19  Temp: 98.3 F (36.8 C)  TempSrc: Oral  SpO2: 97%  Weight: 130 lb 14.4 oz (59.4 kg)  Height: 5\' 1"  (1.549 m)  Body mass index is 24.73 kg/m. Physical Exam In general this is a pleasant elderly female in no distress lying comfortably in bed.  Her skin is warm and dry do not note any increased bruising or bleeding.  Eyes visual acuity appears to be intact sclera and conjunctive are clear.  Oropharynx is clear mucous membranes moist.  Chest is clear to auscultation without labored breathing.  Heart is regular rate and rhythm slightly bradycardic in the 50s-there is mild lower extremity edema appears to be baseline.  Abdomen is soft nontender with positive bowel sounds.  Musculoskeletal Limited exam since he is in bed but is able to move all extremities x4 with baseline lower extremity weakness.  Neurologic appears grossly intact her speech is clear could not appreciate lateralizing findings.  Psych she continues to be pleasant and appropriate oriented to self-does follow simple verbal commands.   Labs reviewed: Recent Labs    11/23/19 0000 11/26/19 0000 12/03/19 0000  NA 138 136* 143  K 3.9 5.3 3.7  CL 106 103 109*  CO2 16 18 18   BUN 21 23* 15  CREATININE 0.9 1.0 0.8  CALCIUM 8.8 8.8 9.1   Recent Labs    06/12/19 0000  AST 14  ALT 12  ALKPHOS 49   Recent Labs    11/23/19 0000 11/26/19 0000 12/03/19 0000  WBC 18.2 14.7 8.9  NEUTROABS 18 12 7   HGB 11.1* 9.4* 11.6*  HCT 35* 29* 36  PLT 312 224 461*   Lab Results  Component Value Date   TSH 1.71 08/01/2018   Lab Results  Component Value Date   HGBA1C 5.7 08/01/2018   Lab Results  Component Value Date   CHOL 140 08/01/2018   HDL 46 08/01/2018   LDLCALC 80 08/01/2018   TRIG 82 08/01/2018   CHOLHDL 2.4 03/03/2012    Significant Diagnostic  Results in last 30 days:  No results found.  Assessment/Plan  #1 history of hypertension as noted above she is on Cozaar 50 mg a day as well as Norvasc 10 mg a day-systolics appear to be in the 130-140 range-considering her advanced age and comorbidities this appears to be stable and appropriate-at this point will monitor.  2.  History of dementia with anxiety this appears stable on Aricept 10 mg a day as well as Xanax 0.25 mg a day-nursing does not report any significant behaviors she continues to be pleasant and appropriate.-Her weight appears to be stable-her diet has been liberalized she is on supplements as well  3.  History of anticoagulation management on chronic Coumadin with history DVT-as well as pulmonary embolism-INR is therapeutic update INR is pending is on 2 mg a day most days except 1.5 mg on Mondays and Wednesdays.  4.  History of coronary artery disease she is on aspirin 81 mg a day and a statin most recent LDL was 80 she also has orders for nitro as needed this is been stable now for some time without complaints of chest pain or shortness of breath.  5.  History of restless legs she also on Requip 3 times a day this appears to be stable and well-tolerated.  6.  History of polymyalgia rheumatica she continues on prednisone 1 mg a day and this appears to be effective.  7.  History of overactive bladder she is on oxybutynin 5 mg twice daily-will reduce this to 2.5 mg twice daily per pharmacy recommendation and monitor-if stable on this suspect he may be able to discontinue it  #  8 history of left femoral fracture status post repair in February 2020-she is stable in this regard she does have tramadol 50 mg twice a day this appears to be effective and well-tolerated.  9.  History of intermittent bradycardia-this is well-tolerated-at times she does have pulses in the 50s but this is not unusual.  [id number]

## 2020-01-04 ENCOUNTER — Non-Acute Institutional Stay (SKILLED_NURSING_FACILITY): Payer: Medicare Other | Admitting: Internal Medicine

## 2020-01-04 ENCOUNTER — Encounter: Payer: Self-pay | Admitting: Internal Medicine

## 2020-01-04 DIAGNOSIS — Z86711 Personal history of pulmonary embolism: Secondary | ICD-10-CM

## 2020-01-04 DIAGNOSIS — I8290 Acute embolism and thrombosis of unspecified vein: Secondary | ICD-10-CM | POA: Diagnosis not present

## 2020-01-04 DIAGNOSIS — Z7901 Long term (current) use of anticoagulants: Secondary | ICD-10-CM | POA: Diagnosis not present

## 2020-01-04 NOTE — Progress Notes (Signed)
Location:  [facility] Room Number: 204-D Place of Service:  SNF (31)  Hennie Duos, MD  Patient Care Team: Hennie Duos, MD as PCP - General (Internal Medicine)  Extended Emergency Contact Information Primary Emergency Contact: Fermin,[name] Address: [address]          [place], [place] [address] [name] of [place] Phone: [phone] Mobile Phone: [phone] Relation: Son Secondary Emergency Contact: [place] of [place] Phone: [phone] Relation: Daughter    Allergies: Ambien [zolpidem tartrate], Codeine, Codeine phosphate, Darvocet [propoxyphene n-acetaminophen], Fish allergy, Fish-derived products, Hydrocodone, Promethazine, Promethazine hcl, Sulfamethoxazole, Vicodin [hydrocodone-acetaminophen], Amoxicillin, and Penicillins  Chief Complaint  Patient presents with  . Anticoagulation    INR management    HPI: Patient is a 86 y.o. female who has chronic PE and DVT and INR check.  Today patient's INR is 2.0 which is..  Past Medical History:  Diagnosis Date  . Anemia 03/02/2012  . Angina pectoris, unstable ([facility]) 03/02/2012  . Anxiety   . Axillary abscess   . Bradycardia 06/13/2012  . CAD (coronary artery disease)    S/p PTCA / stenting (last cath 2004, multiple LAD stents, 2 stents in the right coronary artery all patent)  . Cancer ([facility])   . Complication of anesthesia   . Compression fracture of L2 ([facility]) 03/12/2017  . Dementia ([facility])   . Dysphagia 10/06/2012  . Dyspnea 02/03/2012  . GERD (gastroesophageal reflux disease)   . History of pulmonary embolus (PE) 03/19/2017  . HTN (hypertension)   . PMR (polymyalgia rheumatica) (HCC)   . PONV (postoperative nausea and vomiting)   . Restless leg syndrome   . 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 Started coumadin  . Weakness of both legs 06/09/2012    Past Surgical History:  Procedure Laterality Date  . BREAST LUMPECTOMY      . COLECTOMY    . ESOPHAGOGASTRODUODENOSCOPY (EGD) WITH ESOPHAGEAL DILATION  10/10/2012   Procedure: ESOPHAGOGASTRODUODENOSCOPY (EGD) WITH ESOPHAGEAL DILATION;  Surgeon: Inda Castle, MD;  Location: [facility];  Service: Endoscopy;  Laterality: N/A;  . FEMUR IM NAIL Left 12/13/2018   Procedure: INTRAMEDULLARY (IM) NAIL FEMORAL LEFT;  Surgeon: Paralee Cancel, MD;  [facility];  Service: Orthopedics;  Laterality: Left;  . heart stents  x 8  . INCISION AND DRAINAGE     bilateral axillary, non specific staff  . INCISION AND DRAINAGE ABSCESS  09/28/2012   Procedure: INCISION AND DRAINAGE ABSCESS;  Surgeon: Zenovia Jarred, MD;  Location: [facility];  Service: General;  Laterality: Bilateral;  . stent     cardiac x 8 stents.    Allergies as of 01/04/2020      Reactions   Ambien [zolpidem Tartrate] Nausea And Vomiting   Codeine    Codeine Phosphate Nausea And Vomiting   Darvocet [propoxyphene N-acetaminophen] Nausea And Vomiting   Fish Allergy    Fish-derived Products    Hydrocodone    Promethazine    Promethazine Hcl Nausea And Vomiting   Sulfamethoxazole Other (See Comments)   Pt doesn't remember reaction   Vicodin [hydrocodone-acetaminophen] Other (See Comments)   Unknown reaction   Amoxicillin Nausea And Vomiting, Rash   Penicillins Nausea And Vomiting, Rash      Medication List       Accurate as of January 04, 2020  4:31 PM. If you have any questions, ask your nurse or doctor.  acetaminophen 325 MG tablet Commonly known as: TYLENOL Take 650 mg by mouth every 6 (six) hours as needed for mild pain, moderate pain or fever.   acetaminophen 325 MG tablet Commonly known as: TYLENOL Take 650 mg by mouth every 6 (six) hours. Take routinely for left hip pain   ALPRAZolam 0.5 MG tablet Commonly known as: XANAX Take 0.25 mg by mouth daily.   amLODipine 10 MG tablet Commonly known as: NORVASC Take 10 mg by mouth every morning.   atorvastatin 10 MG tablet Commonly  known as: LIPITOR Take 10 mg by mouth every evening.   donepezil 10 MG tablet Commonly known as: ARICEPT Take 10 mg by mouth every evening.   losartan 50 MG tablet Commonly known as: COZAAR Take 50 mg by mouth at bedtime.   nitroGLYCERIN 0.4 MG SL tablet Commonly known as: NITROSTAT Place 0.4 mg under the tongue every 5 (five) minutes as needed for chest pain.   NON FORMULARY Diet Type:  Liberalized due to poor appetite to Mech soft   NUTRITIONAL SUPPLEMENT PO Take 1 each by mouth 2 (two) times a day. Magic Cup - take with lunch and dinner   ondansetron 4 MG tablet Commonly known as: ZOFRAN Take 4 mg by mouth every 4 (four) hours as needed for nausea.   oxybutynin 5 MG tablet Commonly known as: DITROPAN Take 5 mg by mouth 2 (two) times daily.   polyethylene glycol 17 g packet Commonly known as: MIRALAX / GLYCOLAX Take 17 g by mouth daily as needed for moderate constipation.   predniSONE 1 MG tablet Commonly known as: DELTASONE Take 1 mg by mouth daily with breakfast.   ProAir HFA 108 (90 Base) MCG/ACT inhaler Generic drug: albuterol Inhale 2 puffs into the lungs every 6 (six) hours as needed for wheezing or shortness of breath.   rOPINIRole 1 MG tablet Commonly known as: REQUIP Take 1 mg by mouth See admin instructions. 1mg  at noon, 1mg  2 hours before bed, then 1mg  at bedtime   senna-docusate 8.6-50 MG tablet Commonly known as: Senokot-S Take 1 tablet by mouth 2 (two) times daily.   Silenor 6 MG Tabs Generic drug: Doxepin HCl Take 0.5 tablets (3 mg total) by mouth at bedtime.   traMADol 50 MG tablet Commonly known as: ULTRAM Take 1 tablet (50 mg total) by mouth 2 (two) times daily.   Tums 500 MG chewable tablet Generic drug: calcium carbonate Chew 1 tablet by mouth 3 (three) times daily as needed for indigestion or heartburn.   Vitamin D3 1.25 MG (50000 UT) Caps Take 1 capsule by mouth once a week.   warfarin 2 MG tablet Commonly known as:  COUMADIN Take 2 mg by mouth. Give every Sun,Tues,Thurs,Fri,Sat   warfarin 3 MG tablet Commonly known as: COUMADIN Take 1.5 mg by mouth See admin instructions. Take 0.5 tablet to = 1.5 mg on Mondays and Wednesdays.       No orders of the defined types were placed in this encounter.   Immunization History  Administered Date(s) Administered  . Influenza-Unspecified 08/01/2016, 08/11/2017, 07/02/2018, 08/02/2019  . Moderna SARS-COVID-2 Vaccination 11/01/2019, 11/29/2019  . PPD Test 03/18/2017  . Pneumococcal Conjugate-13 09/17/2017  . Pneumococcal Polysaccharide-23 04/17/2019    Social History   Tobacco Use  . Smoking status: Never Smoker  . Smokeless tobacco: Never Used  Substance Use Topics  . Alcohol use: No     Vitals:   01/04/20 1628  BP: 120/73  Pulse: 77  Resp: 19  Temp: (!)  97.2 F (36.2 C)   Body mass index is 24.73 kg/m.   Patient Active Problem List   Diagnosis Date Noted  . Failure to thrive in adult 01/08/2019  . Moderate protein malnutrition ([facility]) 01/08/2019  . Pressure injury of skin 12/18/2018  . Intertrochanteric fracture of left femur ([facility]) 12/11/2018  . CKD (chronic kidney disease), stage III 12/11/2018  . Depression with anxiety 12/11/2018  . Overactive bladder 09/24/2017  . Insomnia 07/31/2017  . Anxiety 06/16/2017  . Citrobacter infection 03/19/2017  . AKI (acute kidney injury) ([facility]) 03/19/2017  . History of pulmonary embolus (PE) 03/19/2017  . Elevated INR 03/19/2017  . [place] intertrigo 03/19/2017  . Compression fracture of L2 ([facility]) 03/12/2017  . Acute lower UTI 03/12/2017  . Accelerated hypertension 03/12/2017  . Leukocytosis 07/31/2015  . Acute bronchitis 07/28/2015  . Chronic anticoagulation 07/28/2015  . Intractable back pain 07/20/2015  . Back pain 07/20/2015  . Cellulitis and abscess of left buttock 04/12/2013  . Stricture and stenosis of esophagus 10/10/2012  . Dementia ([facility]) 10/06/2012  . Dysphagia 10/06/2012  .  Axillary abscess - bilateral, multiple 09/27/2012  . Hypotension 08/24/2012  . Near syncope 08/24/2012  . Abscess of axilla, left 06/27/2012  . Bradycardia 06/13/2012  . Abscess 06/11/2012  . Chest pain 06/09/2012  . Cellulitis 06/09/2012  . Weakness of both legs 06/09/2012  . PMR (polymyalgia rheumatica) (HCC)   . Restless leg syndrome   . Angina pectoris, unstable ([facility]) 03/02/2012  . Acute blood loss as cause of postoperative anemia 03/02/2012  . Dyspnea 02/03/2012  . UTI (urinary tract infection) 02/03/2012  . CAD (coronary artery disease)   . HTN (hypertension)   . VTE (venous thromboembolism) 10/01/2010  . HYPERCHOLESTEROLEMIA  IIA 08/27/2009  . Hyperlipidemia 12/11/2008  . HYPERTENSION, BENIGN 12/11/2008    CMP     Component Value Date/Time   NA 143 12/03/2019 0000   K 3.7 12/03/2019 0000   CL 109 (A) 12/03/2019 0000   CO2 18 12/03/2019 0000   GLUCOSE 104 (H) 12/17/2018 0417   BUN 15 12/03/2019 0000   CREATININE 0.8 12/03/2019 0000   CREATININE 0.99 12/17/2018 0417   CALCIUM 9.1 12/03/2019 0000   PROT 6.7 03/11/2017 2031   ALBUMIN 3.4 (L) 03/11/2017 2031   AST 14 06/12/2019 0000   ALT 12 06/12/2019 0000   ALKPHOS 49 06/12/2019 0000   BILITOT 0.5 03/11/2017 2031   GFRNONAA 59.56 12/03/2019 0000   GFRAA 69.03 12/03/2019 0000   Recent Labs    11/23/19 0000 11/26/19 0000 12/03/19 0000  NA 138 136* 143  K 3.9 5.3 3.7  CL 106 103 109*  CO2 16 18 18   BUN 21 23* 15  CREATININE 0.9 1.0 0.8  CALCIUM 8.8 8.8 9.1   Recent Labs    06/12/19 0000  AST 14  ALT 12  ALKPHOS 49   Recent Labs    11/23/19 0000 11/26/19 0000 12/03/19 0000  WBC 18.2 14.7 8.9  NEUTROABS 18 12 7   HGB 11.1* 9.4* 11.6*  HCT 35* 29* 36  PLT 312 224 461*   No results for input(s): CHOL, LDLCALC, TRIG in the last 8760 hours.  Invalid input(s): HCL No results found for: [facility] Lab Results  Component Value Date   TSH 1.71 08/01/2018   Lab Results  Component Value Date    HGBA1C 5.7 08/01/2018   Lab Results  Component Value Date   CHOL 140 08/01/2018   HDL 46 08/01/2018   LDLCALC 80 08/01/2018  TRIG 82 08/01/2018   CHOLHDL 2.4 03/03/2012    Significant Diagnostic Results in last 30 days:  No results found.  Assessment and Plan  History of PE/VTE-patient is INR is 2.0 which is therapeutic.  We will continue current regimen 90 mg Tuesday Thursday Saturday and Sunday and 1.5 mg on Monday Wednesday Friday.  Repeat INR 1 week.     Hennie Duos , MD

## 2020-01-06 ENCOUNTER — Encounter: Payer: Self-pay | Admitting: Internal Medicine

## 2020-01-07 ENCOUNTER — Other Ambulatory Visit: Payer: Self-pay | Admitting: Internal Medicine

## 2020-01-07 ENCOUNTER — Encounter: Payer: Self-pay | Admitting: Internal Medicine

## 2020-01-07 MED ORDER — ALPRAZOLAM 0.5 MG PO TABS: 0.2500 mg | ORAL_TABLET | Freq: Every day | ORAL | 0 refills | Status: DC

## 2020-01-08 DIAGNOSIS — L853 Xerosis cutis: Secondary | ICD-10-CM | POA: Diagnosis not present

## 2020-01-08 DIAGNOSIS — I739 Peripheral vascular disease, unspecified: Secondary | ICD-10-CM | POA: Diagnosis not present

## 2020-01-08 DIAGNOSIS — L602 Onychogryphosis: Secondary | ICD-10-CM | POA: Diagnosis not present

## 2020-01-14 ENCOUNTER — Encounter: Payer: Self-pay | Admitting: Internal Medicine

## 2020-01-14 ENCOUNTER — Non-Acute Institutional Stay (SKILLED_NURSING_FACILITY): Payer: Medicare Other | Admitting: Internal Medicine

## 2020-01-14 DIAGNOSIS — Z86711 Personal history of pulmonary embolism: Secondary | ICD-10-CM | POA: Diagnosis not present

## 2020-01-14 DIAGNOSIS — Z7901 Long term (current) use of anticoagulants: Secondary | ICD-10-CM | POA: Diagnosis not present

## 2020-01-14 DIAGNOSIS — J069 Acute upper respiratory infection, unspecified: Secondary | ICD-10-CM | POA: Diagnosis not present

## 2020-01-14 NOTE — Progress Notes (Signed)
This encounter was created in error - please disregard.

## 2020-01-14 NOTE — Progress Notes (Signed)
Location:  [facility] Room Number: 204-D Place of Service:  SNF (31)  Hennie Duos, MD  Patient Care Team: Hennie Duos, MD as PCP - General (Internal Medicine)  Extended Emergency Contact Information Primary Emergency Contact: Gesell,[name] Address: [address]          [place], [place] [address] [name] of [place] Phone: [phone] Mobile Phone: [phone] Relation: Son Secondary Emergency Contact: [place] of [place] Phone: [phone] Relation: Daughter    Allergies: Ambien [zolpidem tartrate], Codeine, Codeine phosphate, Darvocet [propoxyphene n-acetaminophen], Fish allergy, Fish-derived products, Hydrocodone, Promethazine, Promethazine hcl, Sulfamethoxazole, Vicodin [hydrocodone-acetaminophen], Amoxicillin, and Penicillins  Chief Complaint  Patient presents with  . Anticoagulation    INR management    HPI: Patient is 86 y.o. female with history of PE/DVT, on Coumadin who is being seen for an INR check.  Today patient's INR is 2.2 which is therapeutic.  Past Medical History:  Diagnosis Date  . Anemia 03/02/2012  . Angina pectoris, unstable ([place]) 03/02/2012  . Anxiety   . Axillary abscess   . Bradycardia 06/13/2012  . CAD (coronary artery disease)    S/p PTCA / stenting (last cath 2004, multiple LAD stents, 2 stents in the right coronary artery all patent)  . Cancer ([place])   . Complication of anesthesia   . Compression fracture of L2 ([place]) 03/12/2017  . Dementia ([place])   . Dysphagia 10/06/2012  . Dyspnea 02/03/2012  . GERD (gastroesophageal reflux disease)   . History of pulmonary embolus (PE) 03/19/2017  . HTN (hypertension)   . PMR (polymyalgia rheumatica) (HCC)   . PONV (postoperative nausea and vomiting)   . Restless leg syndrome   . 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 Started coumadin  . Weakness of both legs 06/09/2012    Past Surgical History:  Procedure  Laterality Date  . BREAST LUMPECTOMY    . COLECTOMY    . ESOPHAGOGASTRODUODENOSCOPY (EGD) WITH ESOPHAGEAL DILATION  10/10/2012   Procedure: ESOPHAGOGASTRODUODENOSCOPY (EGD) WITH ESOPHAGEAL DILATION;  Surgeon: Inda Castle, MD;  Location: [facility];  Service: Endoscopy;  Laterality: N/A;  . FEMUR IM NAIL Left 12/13/2018   Procedure: INTRAMEDULLARY (IM) NAIL FEMORAL LEFT;  Surgeon: Paralee Cancel, MD;  Location: [facility];  Service: Orthopedics;  Laterality: Left;  . heart stents  x 8  . INCISION AND DRAINAGE     bilateral axillary, non specific staff  . INCISION AND DRAINAGE ABSCESS  09/28/2012   Procedure: INCISION AND DRAINAGE ABSCESS;  Surgeon: Zenovia Jarred, MD;  Location: [facility];  Service: General;  Laterality: Bilateral;  . stent     cardiac x 8 stents.    Allergies as of 01/14/2020      Reactions   Ambien [zolpidem Tartrate] Nausea And Vomiting   Codeine    Codeine Phosphate Nausea And Vomiting   Darvocet [propoxyphene N-acetaminophen] Nausea And Vomiting   Fish Allergy    Fish-derived Products    Hydrocodone    Promethazine    Promethazine Hcl Nausea And Vomiting   Sulfamethoxazole Other (See Comments)   Pt doesn't remember reaction   Vicodin [hydrocodone-acetaminophen] Other (See Comments)   Unknown reaction   Amoxicillin Nausea And Vomiting, Rash   Penicillins Nausea And Vomiting, Rash      Medication List       Accurate as of January 14, 2020 10:07 PM. If you have any questions, ask your nurse or doctor.  acetaminophen 325 MG tablet Commonly known as: TYLENOL Take 650 mg by mouth every 6 (six) hours as needed for mild pain, moderate pain or fever.   acetaminophen 325 MG tablet Commonly known as: TYLENOL Take 650 mg by mouth every 6 (six) hours. Take routinely for left hip pain   ALPRAZolam 0.5 MG tablet Commonly known as: XANAX Take 0.5 tablets (0.25 mg total) by mouth daily.   amLODipine 10 MG tablet Commonly known as: NORVASC Take 10 mg by  mouth every morning.   atorvastatin 10 MG tablet Commonly known as: LIPITOR Take 10 mg by mouth every evening.   donepezil 10 MG tablet Commonly known as: ARICEPT Take 10 mg by mouth every evening.   losartan 50 MG tablet Commonly known as: COZAAR Take 50 mg by mouth at bedtime.   nitroGLYCERIN 0.4 MG SL tablet Commonly known as: NITROSTAT Place 0.4 mg under the tongue every 5 (five) minutes as needed for chest pain.   NON FORMULARY Diet Type:  Liberalized due to poor appetite to Mech soft   NUTRITIONAL SUPPLEMENT PO Take 1 each by mouth 2 (two) times a day. Magic Cup - take with lunch and dinner   ondansetron 4 MG tablet Commonly known as: ZOFRAN Take 4 mg by mouth every 4 (four) hours as needed for nausea.   oxybutynin 5 MG tablet Commonly known as: DITROPAN Take 2.5 mg by mouth 2 (two) times daily. 0.5 tablet to = 2.5 mg   polyethylene glycol 17 g packet Commonly known as: MIRALAX / GLYCOLAX Take 17 g by mouth daily as needed for moderate constipation.   predniSONE 1 MG tablet Commonly known as: DELTASONE Take 1 mg by mouth daily with breakfast.   ProAir HFA 108 (90 Base) MCG/ACT inhaler Generic drug: albuterol Inhale 2 puffs into the lungs every 6 (six) hours as needed for wheezing or shortness of breath.   rOPINIRole 1 MG tablet Commonly known as: REQUIP Take 1 mg by mouth See admin instructions. 1mg  at noon, 1mg  2 hours before bed, then 1mg  at bedtime   senna-docusate 8.6-50 MG tablet Commonly known as: Senokot-S Take 1 tablet by mouth 2 (two) times daily.   Silenor 6 MG Tabs Generic drug: Doxepin HCl Take 0.5 tablets (3 mg total) by mouth at bedtime.   traMADol 50 MG tablet Commonly known as: ULTRAM Take 1 tablet (50 mg total) by mouth 2 (two) times daily.   Tums 500 MG chewable tablet Generic drug: calcium carbonate Chew 1 tablet by mouth 3 (three) times daily as needed for indigestion or heartburn.   Vitamin D3 1.25 MG (50000 UT) Caps Take 1  capsule by mouth once a week.   warfarin 3 MG tablet Commonly known as: COUMADIN Take 1.5 mg by mouth every Monday, Wednesday, and Friday. Take 0.5 tablet to = 1.5 mg   warfarin 2 MG tablet Commonly known as: COUMADIN Take 2 mg by mouth. Give every Sun,Tues,Thurs,Sat Start taking on: January 15, 2020       No orders of the defined types were placed in this encounter.   Immunization History  Administered Date(s) Administered  . Influenza-Unspecified 08/01/2016, 08/11/2017, 07/02/2018, 08/02/2019  . Moderna SARS-COVID-2 Vaccination 11/01/2019, 11/29/2019  . PPD Test 03/18/2017  . Pneumococcal Conjugate-13 09/17/2017  . Pneumococcal Polysaccharide-23 04/17/2019    Social History   Tobacco Use  . Smoking status: Never Smoker  . Smokeless tobacco: Never Used  Substance Use Topics  . Alcohol use: No     Vitals:  01/14/20 1547  BP: 130/69  Pulse: 66  Resp: 19  Temp: (!) 97.2 F (36.2 C)   Body mass index is 24.94 kg/m.   Patient Active Problem List   Diagnosis Date Noted  . Failure to thrive in adult 01/08/2019  . Moderate protein malnutrition ([place]) 01/08/2019  . Pressure injury of skin 12/18/2018  . Intertrochanteric fracture of left femur ([place]) 12/11/2018  . CKD (chronic kidney disease), stage III 12/11/2018  . Depression with anxiety 12/11/2018  . Overactive bladder 09/24/2017  . Insomnia 07/31/2017  . Anxiety 06/16/2017  . Citrobacter infection 03/19/2017  . AKI (acute kidney injury) ([place]) 03/19/2017  . History of pulmonary embolus (PE) 03/19/2017  . Elevated INR 03/19/2017  . [place]idal intertrigo 03/19/2017  . Compression fracture of L2 ([place]) 03/12/2017  . Acute lower UTI 03/12/2017  . Accelerated hypertension 03/12/2017  . Leukocytosis 07/31/2015  . Acute bronchitis 07/28/2015  . Chronic anticoagulation 07/28/2015  . Intractable back pain 07/20/2015  . Back pain 07/20/2015  . Cellulitis and abscess of left buttock 04/12/2013  . Stricture and  stenosis of esophagus 10/10/2012  . Dementia ([place]) 10/06/2012  . Dysphagia 10/06/2012  . Axillary abscess - bilateral, multiple 09/27/2012  . Hypotension 08/24/2012  . Near syncope 08/24/2012  . Abscess of axilla, left 06/27/2012  . Bradycardia 06/13/2012  . Abscess 06/11/2012  . Chest pain 06/09/2012  . Cellulitis 06/09/2012  . Weakness of both legs 06/09/2012  . PMR (polymyalgia rheumatica) (HCC)   . Restless leg syndrome   . Angina pectoris, unstable ([place]) 03/02/2012  . Acute blood loss as cause of postoperative anemia 03/02/2012  . Dyspnea 02/03/2012  . UTI (urinary tract infection) 02/03/2012  . CAD (coronary artery disease)   . HTN (hypertension)   . VTE (venous thromboembolism) 10/01/2010  . HYPERCHOLESTEROLEMIA  IIA 08/27/2009  . Hyperlipidemia 12/11/2008  . HYPERTENSION, BENIGN 12/11/2008    CMP     Component Value Date/Time   NA 143 12/03/2019 0000   K 3.7 12/03/2019 0000   CL 109 (A) 12/03/2019 0000   CO2 18 12/03/2019 0000   GLUCOSE 104 (H) 12/17/2018 0417   BUN 15 12/03/2019 0000   CREATININE 0.8 12/03/2019 0000   CREATININE 0.99 12/17/2018 0417   CALCIUM 9.1 12/03/2019 0000   PROT 6.7 03/11/2017 2031   ALBUMIN 3.4 (L) 03/11/2017 2031   AST 14 06/12/2019 0000   ALT 12 06/12/2019 0000   ALKPHOS 49 06/12/2019 0000   BILITOT 0.5 03/11/2017 2031   GFRNONAA 59.56 12/03/2019 0000   GFRAA 69.03 12/03/2019 0000   Recent Labs    11/23/19 0000 11/26/19 0000 12/03/19 0000  NA 138 136* 143  K 3.9 5.3 3.7  CL 106 103 109*  CO2 16 18 18   BUN 21 23* 15  CREATININE 0.9 1.0 0.8  CALCIUM 8.8 8.8 9.1   Recent Labs    06/12/19 0000  AST 14  ALT 12  ALKPHOS 49   Recent Labs    11/23/19 0000 11/26/19 0000 12/03/19 0000  WBC 18.2 14.7 8.9  NEUTROABS 18 12 7   HGB 11.1* 9.4* 11.6*  HCT 35* 29* 36  PLT 312 224 461*   No results for input(s): CHOL, LDLCALC, TRIG in the last 8760 hours.  Invalid input(s): HCL No results found for: [facility]  Results  Component Value Date   TSH 1.71 08/01/2018   Lab Results  Component Value Date   HGBA1C 5.7 08/01/2018   Lab Results  Component Value Date   CHOL  140 08/01/2018   HDL 46 08/01/2018   LDLCALC 80 08/01/2018   TRIG 82 08/01/2018   CHOLHDL 2.4 03/03/2012    Significant Diagnostic Results in last 30 days:  No results found.  Assessment and Plan  History of PE/anticoagulated on Coumadin-patient's INR is 2.2 which is therapeutic.  Patient is on Coumadin 1.5 mg Monday Wednesday Friday and 2 mg Tuesday Thursday Saturday Sunday.  We will continue current regimen and recheck in 1 week    Hennie Duos , MD

## 2020-01-15 ENCOUNTER — Encounter: Payer: Self-pay | Admitting: Internal Medicine

## 2020-01-15 DIAGNOSIS — M6281 Muscle weakness (generalized): Secondary | ICD-10-CM | POA: Diagnosis not present

## 2020-01-15 DIAGNOSIS — R2681 Unsteadiness on feet: Secondary | ICD-10-CM | POA: Diagnosis not present

## 2020-01-15 DIAGNOSIS — N183 Chronic kidney disease, stage 3 unspecified: Secondary | ICD-10-CM | POA: Diagnosis not present

## 2020-01-16 DIAGNOSIS — R2681 Unsteadiness on feet: Secondary | ICD-10-CM | POA: Diagnosis not present

## 2020-01-16 DIAGNOSIS — N183 Chronic kidney disease, stage 3 unspecified: Secondary | ICD-10-CM | POA: Diagnosis not present

## 2020-01-16 DIAGNOSIS — M6281 Muscle weakness (generalized): Secondary | ICD-10-CM | POA: Diagnosis not present

## 2020-01-17 DIAGNOSIS — M6281 Muscle weakness (generalized): Secondary | ICD-10-CM | POA: Diagnosis not present

## 2020-01-17 DIAGNOSIS — N183 Chronic kidney disease, stage 3 unspecified: Secondary | ICD-10-CM | POA: Diagnosis not present

## 2020-01-17 DIAGNOSIS — R2681 Unsteadiness on feet: Secondary | ICD-10-CM | POA: Diagnosis not present

## 2020-01-19 DIAGNOSIS — R2681 Unsteadiness on feet: Secondary | ICD-10-CM | POA: Diagnosis not present

## 2020-01-19 DIAGNOSIS — M6281 Muscle weakness (generalized): Secondary | ICD-10-CM | POA: Diagnosis not present

## 2020-01-19 DIAGNOSIS — N183 Chronic kidney disease, stage 3 unspecified: Secondary | ICD-10-CM | POA: Diagnosis not present

## 2020-01-21 ENCOUNTER — Encounter: Payer: Self-pay | Admitting: Internal Medicine

## 2020-01-21 ENCOUNTER — Non-Acute Institutional Stay (SKILLED_NURSING_FACILITY): Payer: Medicare Other | Admitting: Internal Medicine

## 2020-01-21 DIAGNOSIS — R2681 Unsteadiness on feet: Secondary | ICD-10-CM | POA: Diagnosis not present

## 2020-01-21 DIAGNOSIS — I8290 Acute embolism and thrombosis of unspecified vein: Secondary | ICD-10-CM | POA: Diagnosis not present

## 2020-01-21 DIAGNOSIS — Z5181 Encounter for therapeutic drug level monitoring: Secondary | ICD-10-CM

## 2020-01-21 DIAGNOSIS — Z86711 Personal history of pulmonary embolism: Secondary | ICD-10-CM

## 2020-01-21 DIAGNOSIS — Z7901 Long term (current) use of anticoagulants: Secondary | ICD-10-CM

## 2020-01-21 DIAGNOSIS — M6281 Muscle weakness (generalized): Secondary | ICD-10-CM | POA: Diagnosis not present

## 2020-01-21 DIAGNOSIS — N183 Chronic kidney disease, stage 3 unspecified: Secondary | ICD-10-CM | POA: Diagnosis not present

## 2020-01-21 NOTE — Progress Notes (Signed)
Location:    [facility] Room Number: 204/D Place of Service:  SNF [phone]) Provider:  Henreitta Leber, MD  Patient Care Team: Hennie Duos, MD as PCP - General (Internal Medicine)  Extended Emergency Contact Information Primary Emergency Contact: Fissel,[name] Address: [place],  [address] [name] of [place] Phone: [phone] Mobile Phone: [phone] Relation: Son Secondary Emergency Contact: [place] of [place] Phone: [phone] Relation: Daughter  Code Status:  DNR Goals of care: Advanced Directive information Advanced Directives 01/21/2020  Does Patient Have a Medical Advance Directive? Yes  Type of Advance Directive Out of facility DNR (pink MOST or yellow form)  Does patient want to make changes to medical advance directive? No - Patient declined  Copy of [place] in Chart? -  Pre-existing out of facility DNR order (yellow form or pink MOST form) Yellow form placed in chart (order not valid for inpatient use)     Chief Complaint  Patient presents with  . Anticoagulation    Anticoagulation Management  On chronic Coumadin with a history of DVT as well as pulmonary embolism  HPI:  Pt is a 86 y.o. female seen today for an acute visit for for anticoagulation management with a history of DVT and pulmonary embolism.  She is on chronic Coumadin.  INR today is subtherapeutic at 1.3 it was 2.2 last week she is on 2 mg on Saturday Sunday Tuesday and Thursday and 1.5 mg Monday Wednesday and Friday.  INR recently has been therapeutic it was borderline supratherapeutic at 2.9 back on February 13 when she was apparently on 2 mg a day this was reduced somewhat in appeared to be stable but it appears this down to 1.3 today-there apparently has been no missed doses.  Patient continues to be at her baseline-- she is in her bed she is pleasant  coloring her book-has no complaints today.     Past Medical History:  Diagnosis Date  . Anemia 03/02/2012  . Angina pectoris, unstable ([place]) 03/02/2012  . Anxiety   . Axillary abscess   . Bradycardia 06/13/2012  . CAD (coronary artery disease)    S/p PTCA / stenting (last cath 2004, multiple LAD stents, 2 stents in the right coronary artery all patent)  . Cancer ([place])   . Complication of anesthesia   . Compression fracture of L2 ([place]) 03/12/2017  . Dementia ([place])   . Dysphagia 10/06/2012  . Dyspnea 02/03/2012  . GERD (gastroesophageal reflux disease)   . History of pulmonary embolus (PE) 03/19/2017  . HTN (hypertension)   . PMR (polymyalgia rheumatica) (HCC)   . PONV (postoperative nausea and vomiting)   . Restless leg syndrome   . 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 Started coumadin  . Weakness of both legs 06/09/2012   Past Surgical History:  Procedure Laterality Date  . BREAST LUMPECTOMY    . COLECTOMY    . ESOPHAGOGASTRODUODENOSCOPY (EGD) WITH ESOPHAGEAL DILATION  10/10/2012   Procedure: ESOPHAGOGASTRODUODENOSCOPY (EGD) WITH ESOPHAGEAL DILATION;  Surgeon: Inda Castle, MD;  Location: [facility];  Service: Endoscopy;  Laterality: N/A;  . FEMUR IM NAIL Left 12/13/2018   Procedure: INTRAMEDULLARY (IM) NAIL FEMORAL LEFT;  Surgeon: Paralee Cancel, MD;  [facility];  Service: Orthopedics;  Laterality: Left;  . heart stents  x 8  . INCISION AND DRAINAGE  bilateral axillary, non specific staff  . INCISION AND DRAINAGE ABSCESS  09/28/2012   Procedure: INCISION AND DRAINAGE ABSCESS;  Surgeon: Zenovia Jarred, MD;  Location: [facility];  Service: General;  Laterality: Bilateral;  . stent     cardiac x 8 stents.    Allergies  Allergen Reactions  . Ambien [Zolpidem Tartrate] Nausea And Vomiting  . Codeine   . Codeine Phosphate Nausea And Vomiting  . Darvocet [Propoxyphene N-Acetaminophen] Nausea And Vomiting  . Fish Allergy   . Fish-Derived Products   .  Hydrocodone   . Promethazine   . Promethazine Hcl Nausea And Vomiting  . Sulfamethoxazole Other (See Comments)    Pt doesn't remember reaction  . Vicodin [Hydrocodone-Acetaminophen] Other (See Comments)    Unknown reaction  . Amoxicillin Nausea And Vomiting and Rash  . Penicillins Nausea And Vomiting and Rash    Outpatient Encounter Medications as of 01/21/2020  Medication Sig  . acetaminophen (TYLENOL) 325 MG tablet Take 650 mg by mouth every 6 (six) hours as needed for mild pain, moderate pain or fever.  Marland Kitchen acetaminophen (TYLENOL) 325 MG tablet Take 650 mg by mouth every 6 (six) hours. Take routinely for left hip pain  . albuterol (PROAIR HFA) 108 (90 Base) MCG/ACT inhaler Inhale 2 puffs into the lungs every 6 (six) hours as needed for wheezing or shortness of breath.  . ALPRAZolam (XANAX) 0.5 MG tablet Take 0.5 tablets (0.25 mg total) by mouth daily.  Marland Kitchen amLODipine (NORVASC) 10 MG tablet Take 10 mg by mouth every morning.   Marland Kitchen atorvastatin (LIPITOR) 10 MG tablet Take 10 mg by mouth every evening.  . calcium carbonate (TUMS) 500 MG chewable tablet Chew 1 tablet by mouth 3 (three) times daily as needed for indigestion or heartburn.  . Cholecalciferol (VITAMIN D3) 1.25 MG (50000 UT) CAPS Take 1 capsule by mouth once a week.  . donepezil (ARICEPT) 10 MG tablet Take 10 mg by mouth every evening.   Marland Kitchen losartan (COZAAR) 50 MG tablet Take 50 mg by mouth at bedtime.   . nitroGLYCERIN (NITROSTAT) 0.4 MG SL tablet Place 0.4 mg under the tongue every 5 (five) minutes as needed for chest pain.  . NON FORMULARY Diet Type:  Liberalized due to poor appetite to Mech soft  . Nutritional Supplements (NUTRITIONAL SUPPLEMENT PO) Take 1 each by mouth 2 (two) times a day. Magic Cup - take with lunch and dinner  . ondansetron (ZOFRAN) 4 MG tablet Take 4 mg by mouth every 4 (four) hours as needed for nausea.   Marland Kitchen oxybutynin (DITROPAN) 5 MG tablet Take 2.5 mg by mouth 2 (two) times daily. 0.5 tablet to = 2.5 mg  .  polyethylene glycol (MIRALAX / GLYCOLAX) packet Take 17 g by mouth daily as needed for moderate constipation.  . predniSONE (DELTASONE) 1 MG tablet Take 1 mg by mouth daily with breakfast.   . rOPINIRole (REQUIP) 1 MG tablet 1mg  at noon, 1mg  2 hours before bed, then 1mg  at bedtime  . senna-docusate (SENOKOT-S) 8.6-50 MG tablet Take 1 tablet by mouth 2 (two) times daily.  Marland Kitchen SILENOR 6 MG TABS Take 0.5 tablets (3 mg total) by mouth at bedtime.  . traMADol (ULTRAM) 50 MG tablet Take 1 tablet (50 mg total) by mouth 2 (two) times daily.  Marland Kitchen warfarin (COUMADIN) 2 MG tablet Take 2 mg by mouth daily.   . [DISCONTINUED] warfarin (COUMADIN) 3 MG tablet Take 1.5 mg by mouth every Monday, Wednesday, and Friday. Take 0.5 tablet to =  1.5 mg   No facility-administered encounter medications on file as of 01/21/2020.    Review of Systems   This is somewhat limited since patient is a poor historian-nursing does not report any issues.  Has been no evidence of increased bruising or bleeding she does not complain of any shortness of breath or pain at this point  Immunization History  Administered Date(s) Administered  . Influenza-Unspecified 08/01/2016, 08/11/2017, 07/02/2018, 08/02/2019  . Moderna SARS-COVID-2 Vaccination 11/01/2019, 11/29/2019  . PPD Test 03/18/2017  . Pneumococcal Conjugate-13 09/17/2017  . Pneumococcal Polysaccharide-23 04/17/2019   Pertinent  Health Maintenance Due  Topic Date Due  . INFLUENZA VACCINE  Completed  . DEXA SCAN  Completed  . PNA vac Low Risk Adult  Completed   Fall Risk  03/14/2018  Falls in the past year? No   Functional Status Survey:    Vitals:   01/21/20 1609  BP: 128/80  Pulse: 69  Resp: 16  Temp: (!) 96.9 F (36.1 C)  TempSrc: Oral  SpO2: 97%  Weight: 132 lb (59.9 kg)  Height: 5\' 1"  (1.549 m)   Body mass index is 24.94 kg/m. Physical Exam In general this is a pleasant elderly female no distress lying comfortably in bed she is coloring in her book.   Her skin is warm and dry I do not see evidence of any increased bruising or bleeding.  Eyes visual acuity appears to be intact sclera and conjunctive are clear.  Oropharynx is clear mucous membranes moist.  Chest is clear to auscultation there is no labored breathing.  Heart is regular rate and rhythm without murmur gallop or rub she has her baseline lower extremity edema.  Abdomen is soft nontender with positive bowel sounds.  Musculoskeletal Limited exam since she is in bed but is able to move all her extremities x4 it appears at baseline with baseline lower extremity weakness.  Neurologic is grossly intact her speech is clear cannot appreciate lateralizing findings.  Psych she is oriented to self she is pleasant appropriate follows simple verbal commands.   Labs reviewed: Recent Labs    11/23/19 0000 11/26/19 0000 12/03/19 0000  NA 138 136* 143  K 3.9 5.3 3.7  CL 106 103 109*  CO2 16 18 18   BUN 21 23* 15  CREATININE 0.9 1.0 0.8  CALCIUM 8.8 8.8 9.1   Recent Labs    06/12/19 0000  AST 14  ALT 12  ALKPHOS 49   Recent Labs    11/23/19 0000 11/26/19 0000 12/03/19 0000  WBC 18.2 14.7 8.9  NEUTROABS 18 12 7   HGB 11.1* 9.4* 11.6*  HCT 35* 29* 36  PLT 312 224 461*   Lab Results  Component Value Date   TSH 1.71 08/01/2018   Lab Results  Component Value Date   HGBA1C 5.7 08/01/2018   Lab Results  Component Value Date   CHOL 140 08/01/2018   HDL 46 08/01/2018   LDLCALC 80 08/01/2018   TRIG 82 08/01/2018   CHOLHDL 2.4 03/03/2012    Significant Diagnostic Results in last 30 days:  No results found.  Assessment/Plan  History of pulmonary embolism DVT-on chronic Coumadin-INR is subtherapeutic at 1.3-she has been on Coumadin 2 mg Sundays Saturday Tuesdays and Thursdays and 5 mg Monday Wednesday and Friday.  We will increase to 2 mg every day and we will check a PT/INR in 1 week.  Clinically appears to be stable in this regards.  [id number]

## 2020-01-22 ENCOUNTER — Encounter: Payer: Self-pay | Admitting: Internal Medicine

## 2020-01-22 DIAGNOSIS — N183 Chronic kidney disease, stage 3 unspecified: Secondary | ICD-10-CM | POA: Diagnosis not present

## 2020-01-22 DIAGNOSIS — M6281 Muscle weakness (generalized): Secondary | ICD-10-CM | POA: Diagnosis not present

## 2020-01-22 DIAGNOSIS — R2681 Unsteadiness on feet: Secondary | ICD-10-CM | POA: Diagnosis not present

## 2020-01-23 DIAGNOSIS — N183 Chronic kidney disease, stage 3 unspecified: Secondary | ICD-10-CM | POA: Diagnosis not present

## 2020-01-23 DIAGNOSIS — M6281 Muscle weakness (generalized): Secondary | ICD-10-CM | POA: Diagnosis not present

## 2020-01-23 DIAGNOSIS — F5101 Primary insomnia: Secondary | ICD-10-CM | POA: Diagnosis not present

## 2020-01-23 DIAGNOSIS — R2681 Unsteadiness on feet: Secondary | ICD-10-CM | POA: Diagnosis not present

## 2020-01-23 DIAGNOSIS — F0390 Unspecified dementia without behavioral disturbance: Secondary | ICD-10-CM | POA: Diagnosis not present

## 2020-01-24 DIAGNOSIS — N183 Chronic kidney disease, stage 3 unspecified: Secondary | ICD-10-CM | POA: Diagnosis not present

## 2020-01-24 DIAGNOSIS — M6281 Muscle weakness (generalized): Secondary | ICD-10-CM | POA: Diagnosis not present

## 2020-01-24 DIAGNOSIS — R2681 Unsteadiness on feet: Secondary | ICD-10-CM | POA: Diagnosis not present

## 2020-01-26 DIAGNOSIS — R2681 Unsteadiness on feet: Secondary | ICD-10-CM | POA: Diagnosis not present

## 2020-01-26 DIAGNOSIS — N183 Chronic kidney disease, stage 3 unspecified: Secondary | ICD-10-CM | POA: Diagnosis not present

## 2020-01-26 DIAGNOSIS — M6281 Muscle weakness (generalized): Secondary | ICD-10-CM | POA: Diagnosis not present

## 2020-01-27 DIAGNOSIS — R2681 Unsteadiness on feet: Secondary | ICD-10-CM | POA: Diagnosis not present

## 2020-01-27 DIAGNOSIS — M6281 Muscle weakness (generalized): Secondary | ICD-10-CM | POA: Diagnosis not present

## 2020-01-27 DIAGNOSIS — N183 Chronic kidney disease, stage 3 unspecified: Secondary | ICD-10-CM | POA: Diagnosis not present

## 2020-01-28 ENCOUNTER — Non-Acute Institutional Stay (SKILLED_NURSING_FACILITY): Payer: Medicare Other | Admitting: Internal Medicine

## 2020-01-28 DIAGNOSIS — Z86711 Personal history of pulmonary embolism: Secondary | ICD-10-CM

## 2020-01-28 DIAGNOSIS — Z7901 Long term (current) use of anticoagulants: Secondary | ICD-10-CM

## 2020-01-29 ENCOUNTER — Encounter: Payer: Self-pay | Admitting: Internal Medicine

## 2020-01-29 DIAGNOSIS — M6281 Muscle weakness (generalized): Secondary | ICD-10-CM | POA: Diagnosis not present

## 2020-01-29 DIAGNOSIS — N183 Chronic kidney disease, stage 3 unspecified: Secondary | ICD-10-CM | POA: Diagnosis not present

## 2020-01-29 DIAGNOSIS — R2681 Unsteadiness on feet: Secondary | ICD-10-CM | POA: Diagnosis not present

## 2020-01-29 NOTE — Progress Notes (Signed)
Location:  [facility] Room Number: 204-D Place of Service:  SNF (31)  Hennie Duos, MD  Patient Care Team: Hennie Duos, MD as PCP - General (Internal Medicine)  Extended Emergency Contact Information Primary Emergency Contact: Elias,[name] Address: [address]          [place], [place] [address] [name] of [place] Phone: [phone] Mobile Phone: [phone] Relation: Son Secondary Emergency Contact: [place] of [place] Phone: [phone] Relation: Daughter    Allergies: Ambien [zolpidem tartrate], Codeine, Codeine phosphate, Darvocet [propoxyphene n-acetaminophen], Fish allergy, Fish-derived products, Hydrocodone, Promethazine, Promethazine hcl, Sulfamethoxazole, Vicodin [hydrocodone-acetaminophen], Amoxicillin, and Penicillins  Chief Complaint  Patient presents with  . Anticoagulation    Patient seen for Coumadin management    HPI: Patient is a 86 y.o. female with history of PE who is being seen for an INR check.  Today patient's INR is 2.0 which is therapeutic.  Past Medical History:  Diagnosis Date  . Anemia 03/02/2012  . Angina pectoris, unstable ([place]) 03/02/2012  . Anxiety   . Axillary abscess   . Bradycardia 06/13/2012  . CAD (coronary artery disease)    S/p PTCA / stenting (last cath 2004, multiple LAD stents, 2 stents in the right coronary artery all patent)  . Cancer ([place])   . Complication of anesthesia   . Compression fracture of L2 ([place]) 03/12/2017  . Dementia ([place])   . Dysphagia 10/06/2012  . Dyspnea 02/03/2012  . GERD (gastroesophageal reflux disease)   . History of pulmonary embolus (PE) 03/19/2017  . HTN (hypertension)   . PMR (polymyalgia rheumatica) (HCC)   . PONV (postoperative nausea and vomiting)   . Restless leg syndrome   . 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 Started coumadin  . Weakness of both legs 06/09/2012    Past Surgical History:    Procedure Laterality Date  . BREAST LUMPECTOMY    . COLECTOMY    . ESOPHAGOGASTRODUODENOSCOPY (EGD) WITH ESOPHAGEAL DILATION  10/10/2012   Procedure: ESOPHAGOGASTRODUODENOSCOPY (EGD) WITH ESOPHAGEAL DILATION;  Surgeon: Inda Castle, MD;  Location: [facility];  Service: Endoscopy;  Laterality: N/A;  . FEMUR IM NAIL Left 12/13/2018   Procedure: INTRAMEDULLARY (IM) NAIL FEMORAL LEFT;  Surgeon: Paralee Cancel, MD;  Location: [facility];  Service: Orthopedics;  Laterality: Left;  . heart stents  x 8  . INCISION AND DRAINAGE     bilateral axillary, non specific staff  . INCISION AND DRAINAGE ABSCESS  09/28/2012   Procedure: INCISION AND DRAINAGE ABSCESS;  Surgeon: Zenovia Jarred, MD;  Location: [facility];  Service: General;  Laterality: Bilateral;  . stent     cardiac x 8 stents.    Allergies as of 01/28/2020      Reactions   Ambien [zolpidem Tartrate] Nausea And Vomiting   Codeine    Codeine Phosphate Nausea And Vomiting   Darvocet [propoxyphene N-acetaminophen] Nausea And Vomiting   Fish Allergy    Fish-derived Products    Hydrocodone    Promethazine    Promethazine Hcl Nausea And Vomiting   Sulfamethoxazole Other (See Comments)   Pt doesn't remember reaction   Vicodin [hydrocodone-acetaminophen] Other (See Comments)   Unknown reaction   Amoxicillin Nausea And Vomiting, Rash   Penicillins Nausea And Vomiting, Rash      Medication List       Accurate as of January 28, 2020 11:59 PM. If you have any questions, ask your nurse or doctor.  acetaminophen 325 MG tablet Commonly known as: TYLENOL Take 650 mg by mouth every 6 (six) hours as needed for mild pain, moderate pain or fever.   acetaminophen 325 MG tablet Commonly known as: TYLENOL Take 650 mg by mouth every 6 (six) hours. Take routinely for left hip pain   ALPRAZolam 0.5 MG tablet Commonly known as: XANAX Take 0.5 tablets (0.25 mg total) by mouth daily.   amLODipine 10 MG tablet Commonly known as: NORVASC Take  10 mg by mouth every morning.   atorvastatin 10 MG tablet Commonly known as: LIPITOR Take 10 mg by mouth every evening.   donepezil 10 MG tablet Commonly known as: ARICEPT Take 10 mg by mouth every evening.   losartan 50 MG tablet Commonly known as: COZAAR Take 50 mg by mouth at bedtime.   nitroGLYCERIN 0.4 MG SL tablet Commonly known as: NITROSTAT Place 0.4 mg under the tongue every 5 (five) minutes as needed for chest pain.   NON FORMULARY Diet Type:  Liberalized due to poor appetite to Mech soft   NUTRITIONAL SUPPLEMENT PO Take 1 each by mouth 2 (two) times a day. Magic Cup - take with lunch and dinner   ondansetron 4 MG tablet Commonly known as: ZOFRAN Take 4 mg by mouth every 4 (four) hours as needed for nausea.   oxybutynin 5 MG tablet Commonly known as: DITROPAN Take 2.5 mg by mouth 2 (two) times daily. 0.5 tablet to = 2.5 mg   polyethylene glycol 17 g packet Commonly known as: MIRALAX / GLYCOLAX Take 17 g by mouth daily as needed for moderate constipation.   predniSONE 1 MG tablet Commonly known as: DELTASONE Take 1 mg by mouth daily with breakfast.   ProAir HFA 108 (90 Base) MCG/ACT inhaler Generic drug: albuterol Inhale 2 puffs into the lungs every 6 (six) hours as needed for wheezing or shortness of breath.   rOPINIRole 1 MG tablet Commonly known as: REQUIP 1mg  at noon, 1mg  2 hours before bed, then 1mg  at bedtime   senna-docusate 8.6-50 MG tablet Commonly known as: Senokot-S Take 1 tablet by mouth 2 (two) times daily.   Silenor 6 MG Tabs Generic drug: Doxepin HCl Take 0.5 tablets (3 mg total) by mouth at bedtime.   traMADol 50 MG tablet Commonly known as: ULTRAM Take 1 tablet (50 mg total) by mouth 2 (two) times daily.   Tums 500 MG chewable tablet Generic drug: calcium carbonate Chew 1 tablet by mouth 3 (three) times daily as needed for indigestion or heartburn.   Vitamin D3 1.25 MG (50000 UT) Caps Take 1 capsule by mouth once a week.     warfarin 2 MG tablet Commonly known as: COUMADIN Take 2 mg by mouth daily.       No orders of the defined types were placed in this encounter.   Immunization History  Administered Date(s) Administered  . Influenza-Unspecified 08/01/2016, 08/11/2017, 07/02/2018, 08/02/2019  . Moderna SARS-COVID-2 Vaccination 11/01/2019, 11/29/2019  . PPD Test 03/18/2017  . Pneumococcal Conjugate-13 09/17/2017  . Pneumococcal Polysaccharide-23 04/17/2019    Social History   Tobacco Use  . Smoking status: Never Smoker  . Smokeless tobacco: Never Used  Substance Use Topics  . Alcohol use: No     Vitals:   01/29/20 1235  BP: 131/68  Pulse: (!) 58  Resp: 20  Temp: 97.6 F (36.4 C)   Body mass index is 24.94 kg/m.   Patient Active Problem List   Diagnosis Date Noted  . Failure to thrive  in adult 01/08/2019  . Moderate protein malnutrition ([place]) 01/08/2019  . Pressure injury of skin 12/18/2018  . Intertrochanteric fracture of left femur ([place]) 12/11/2018  . CKD (chronic kidney disease), stage III 12/11/2018  . Depression with anxiety 12/11/2018  . Overactive bladder 09/24/2017  . Insomnia 07/31/2017  . Anxiety 06/16/2017  . Citrobacter infection 03/19/2017  . AKI (acute kidney injury) ([facility]) 03/19/2017  . History of pulmonary embolus (PE) 03/19/2017  . Elevated INR 03/19/2017  . [place] intertrigo 03/19/2017  . Compression fracture of L2 ([place]) 03/12/2017  . Acute lower UTI 03/12/2017  . Accelerated hypertension 03/12/2017  . Leukocytosis 07/31/2015  . Acute bronchitis 07/28/2015  . Anticoagulated on Coumadin 07/28/2015  . Intractable back pain 07/20/2015  . Back pain 07/20/2015  . Cellulitis and abscess of left buttock 04/12/2013  . Stricture and stenosis of esophagus 10/10/2012  . Dementia ([place]) 10/06/2012  . Dysphagia 10/06/2012  . Axillary abscess - bilateral, multiple 09/27/2012  . Hypotension 08/24/2012  . Near syncope 08/24/2012  . Abscess of axilla, left  06/27/2012  . Bradycardia 06/13/2012  . Abscess 06/11/2012  . Chest pain 06/09/2012  . Cellulitis 06/09/2012  . Weakness of both legs 06/09/2012  . PMR (polymyalgia rheumatica) (HCC)   . Restless leg syndrome   . Angina pectoris, unstable ([place]) 03/02/2012  . Acute blood loss as cause of postoperative anemia 03/02/2012  . Dyspnea 02/03/2012  . UTI (urinary tract infection) 02/03/2012  . CAD (coronary artery disease)   . HTN (hypertension)   . VTE (venous thromboembolism) 10/01/2010  . HYPERCHOLESTEROLEMIA  IIA 08/27/2009  . Hyperlipidemia 12/11/2008  . HYPERTENSION, BENIGN 12/11/2008    CMP     Component Value Date/Time   NA 143 12/03/2019 0000   K 3.7 12/03/2019 0000   CL 109 (A) 12/03/2019 0000   CO2 18 12/03/2019 0000   GLUCOSE 104 (H) 12/17/2018 0417   BUN 15 12/03/2019 0000   CREATININE 0.8 12/03/2019 0000   CREATININE 0.99 12/17/2018 0417   CALCIUM 9.1 12/03/2019 0000   PROT 6.7 03/11/2017 2031   ALBUMIN 3.4 (L) 03/11/2017 2031   AST 14 06/12/2019 0000   ALT 12 06/12/2019 0000   ALKPHOS 49 06/12/2019 0000   BILITOT 0.5 03/11/2017 2031   GFRNONAA 59.56 12/03/2019 0000   GFRAA 69.03 12/03/2019 0000   Recent Labs    11/23/19 0000 11/26/19 0000 12/03/19 0000  NA 138 136* 143  K 3.9 5.3 3.7  CL 106 103 109*  CO2 16 18 18   BUN 21 23* 15  CREATININE 0.9 1.0 0.8  CALCIUM 8.8 8.8 9.1   Recent Labs    06/12/19 0000  AST 14  ALT 12  ALKPHOS 49   Recent Labs    11/23/19 0000 11/26/19 0000 12/03/19 0000  WBC 18.2 14.7 8.9  NEUTROABS 18 12 7   HGB 11.1* 9.4* 11.6*  HCT 35* 29* 36  PLT 312 224 461*   No results for input(s): CHOL, LDLCALC, TRIG in the last 8760 hours.  Invalid input(s): HCL No results found for: [facility] Lab Results  Component Value Date   TSH 1.71 08/01/2018   Lab Results  Component Value Date   HGBA1C 5.7 08/01/2018   Lab Results  Component Value Date   CHOL 140 08/01/2018   HDL 46 08/01/2018   LDLCALC 80 08/01/2018    TRIG 82 08/01/2018   CHOLHDL 2.4 03/03/2012    Significant Diagnostic Results in last 30 days:  No results found.  Assessment and Plan  History of PE/anticoagulated on Coumadin-today patient's INR is 2.0 which is therapeutic, on Coumadin 2 mg daily.  Continue same regimen and repeat INR in 1 week    Hennie Duos , MD

## 2020-01-30 DIAGNOSIS — R2681 Unsteadiness on feet: Secondary | ICD-10-CM | POA: Diagnosis not present

## 2020-01-30 DIAGNOSIS — N183 Chronic kidney disease, stage 3 unspecified: Secondary | ICD-10-CM | POA: Diagnosis not present

## 2020-01-30 DIAGNOSIS — M6281 Muscle weakness (generalized): Secondary | ICD-10-CM | POA: Diagnosis not present

## 2020-01-31 ENCOUNTER — Other Ambulatory Visit: Payer: Self-pay | Admitting: Internal Medicine

## 2020-01-31 ENCOUNTER — Encounter: Payer: Self-pay | Admitting: Internal Medicine

## 2020-01-31 ENCOUNTER — Non-Acute Institutional Stay (SKILLED_NURSING_FACILITY): Payer: Medicare Other | Admitting: Internal Medicine

## 2020-01-31 DIAGNOSIS — I2510 Atherosclerotic heart disease of native coronary artery without angina pectoris: Secondary | ICD-10-CM

## 2020-01-31 DIAGNOSIS — R2681 Unsteadiness on feet: Secondary | ICD-10-CM | POA: Diagnosis not present

## 2020-01-31 DIAGNOSIS — G301 Alzheimer's disease with late onset: Secondary | ICD-10-CM | POA: Diagnosis not present

## 2020-01-31 DIAGNOSIS — I10 Essential (primary) hypertension: Secondary | ICD-10-CM | POA: Diagnosis not present

## 2020-01-31 DIAGNOSIS — G2581 Restless legs syndrome: Secondary | ICD-10-CM

## 2020-01-31 DIAGNOSIS — F0280 Dementia in other diseases classified elsewhere without behavioral disturbance: Secondary | ICD-10-CM

## 2020-01-31 DIAGNOSIS — M6281 Muscle weakness (generalized): Secondary | ICD-10-CM | POA: Diagnosis not present

## 2020-01-31 DIAGNOSIS — N183 Chronic kidney disease, stage 3 unspecified: Secondary | ICD-10-CM | POA: Diagnosis not present

## 2020-01-31 DIAGNOSIS — N3281 Overactive bladder: Secondary | ICD-10-CM

## 2020-01-31 MED ORDER — TRAMADOL HCL 50 MG PO TABS: 50.0000 mg | ORAL_TABLET | Freq: Two times a day (BID) | ORAL | 0 refills | Status: DC

## 2020-01-31 NOTE — Progress Notes (Signed)
Location:    [facility] Room Number: 204/D Place of Service:  SNF (31) Provider:  Leda Min, MD  Patient Care Team: Hennie Duos, MD as PCP - General (Internal Medicine)  Extended Emergency Contact Information Primary Emergency Contact: [name],[name] Address: [place], [place] [address] [name] of [place] Phone: [phone] Mobile Phone: [phone] Relation: Son Secondary Emergency Contact: [place] of [place] Phone: [phone] Relation: Daughter  Code Status:  DNR Goals of care: Advanced Directive information Advanced Directives 01/31/2020  Does Patient Have a Medical Advance Directive? Yes  Type of Advance Directive Out of facility DNR (pink MOST or yellow form)  Does patient want to make changes to medical advance directive? No - Patient declined  Copy of [place] in Chart? -  Pre-existing out of facility DNR order (yellow form or pink MOST form) Yellow form placed in chart (order not valid for inpatient use)     Chief Complaint  Patient presents with  . Medical Management of Chronic Issues    Routine visit of medical management  Medical management of chronic medical conditions including dementia-coronary artery disease-hypertension-polymyalgia rheumatica-overactive bladder-and hyperlipidemia. As well as history of DVT  HPI:  Pt is a 86 y.o. female seen today for medical management of chronic diseases.  As noted above.  Patient does not report any acute issues and nursing staff does not report any concerns at this point.  She is a long-term resident of the facility with the above diagnoses.  She does have a history of DVT and pulmonary embolism is on chronic Coumadin currently 2 mg a day.  Most recent INR was therapeutic at 2.0 update INR is pending.  She has a history of dementia but does quite well with supportive  care her weight continues to be fairly stable at 132 pounds.  She is on Aricept 10 mg a day and Xanax which she receives once a day.  She also has a history of overactive bladder she is on Ditropan and we reduced this to 0.25 mg twice daily per pharmacy recommendation and this appears to be doing all right.  She also has a history of hypertension blood pressures appear to be pretty stable occasionally she will have an elevated systolic but recent systolics have been in the 11/21/1928 range-she is on Cozaar 50 mg a day and Norvasc 10 mg a day.  Currently she is lying in bed coloring in her book which is the way I usually find her-she remains pleasant cooperative.  Vital signs are stable   Past Medical History:  Diagnosis Date  . Anemia 03/02/2012  . Angina pectoris, unstable ([place]) 03/02/2012  . Anxiety   . Axillary abscess   . Bradycardia 06/13/2012  . CAD (coronary artery disease)    S/p PTCA / stenting (last cath 2004, multiple LAD stents, 2 stents in the right coronary artery all patent)  . Cancer ([place])   . Complication of anesthesia   . Compression fracture of L2 ([place]) 03/12/2017  . Dementia ([place])   . Dysphagia 10/06/2012  . Dyspnea 02/03/2012  . GERD (gastroesophageal reflux disease)   . History of pulmonary embolus (PE) 03/19/2017  . HTN (hypertension)   . PMR (polymyalgia rheumatica) (HCC)   . PONV (postoperative nausea and vomiting)   . Restless leg syndrome   . VTE (venous thromboembolism) 10/2010   DVT  and PE. Started coumadin  . Weakness of both legs 06/09/2012   Past Surgical History:  Procedure Laterality Date  . BREAST LUMPECTOMY    . COLECTOMY    . ESOPHAGOGASTRODUODENOSCOPY (EGD) WITH ESOPHAGEAL DILATION  10/10/2012   Procedure: ESOPHAGOGASTRODUODENOSCOPY (EGD) WITH ESOPHAGEAL DILATION;  Surgeon: Inda Castle, MD;  Location: [facility];  Service: Endoscopy;  Laterality: N/A;  . FEMUR IM NAIL Left 12/13/2018   Procedure: INTRAMEDULLARY (IM) NAIL FEMORAL LEFT;   Surgeon: Paralee Cancel, MD;  Location[facility];  Service: Orthopedics;  Laterality: Left;  . heart stents  x 8  . INCISION AND DRAINAGE     bilateral axillary, non specific staff  . INCISION AND DRAINAGE ABSCESS  09/28/2012   Procedure: INCISION AND DRAINAGE ABSCESS;  Surgeon: Zenovia Jarred, MD;  Location: [facility];  Service: General;  Laterality: Bilateral;  . stent     cardiac x 8 stents.    Allergies  Allergen Reactions  . Ambien [Zolpidem Tartrate] Nausea And Vomiting  . Codeine   . Codeine Phosphate Nausea And Vomiting  . Darvocet [Propoxyphene N-Acetaminophen] Nausea And Vomiting  . Fish Allergy   . Fish-Derived Products   . Hydrocodone   . Promethazine   . Promethazine Hcl Nausea And Vomiting  . Sulfamethoxazole Other (See Comments)    Pt doesn't remember reaction  . Vicodin [Hydrocodone-Acetaminophen] Other (See Comments)    Unknown reaction  . Amoxicillin Nausea And Vomiting and Rash  . Penicillins Nausea And Vomiting and Rash    Allergies as of 01/31/2020      Reactions   Ambien [zolpidem Tartrate] Nausea And Vomiting   Codeine    Codeine Phosphate Nausea And Vomiting   Darvocet [propoxyphene N-acetaminophen] Nausea And Vomiting   Fish Allergy    Fish-derived Products    Hydrocodone    Promethazine    Promethazine Hcl Nausea And Vomiting   Sulfamethoxazole Other (See Comments)   Pt doesn't remember reaction   Vicodin [hydrocodone-acetaminophen] Other (See Comments)   Unknown reaction   Amoxicillin Nausea And Vomiting, Rash   Penicillins Nausea And Vomiting, Rash      Medication List       Accurate as of January 31, 2020 12:57 PM. If you have any questions, ask your nurse or doctor.        acetaminophen 325 MG tablet Commonly known as: TYLENOL Take 650 mg by mouth every 6 (six) hours as needed for mild pain, moderate pain or fever.   acetaminophen 325 MG tablet Commonly known as: TYLENOL Take 650 mg by mouth every 6 (six) hours. Take routinely for left  hip pain   ALPRAZolam 0.5 MG tablet Commonly known as: XANAX Take 0.5 tablets (0.25 mg total) by mouth daily.   amLODipine 10 MG tablet Commonly known as: NORVASC Take 10 mg by mouth every morning.   atorvastatin 10 MG tablet Commonly known as: LIPITOR Take 10 mg by mouth every evening.   donepezil 10 MG tablet Commonly known as: ARICEPT Take 10 mg by mouth every evening.   losartan 50 MG tablet Commonly known as: COZAAR Take 50 mg by mouth at bedtime.   nitroGLYCERIN 0.4 MG SL tablet Commonly known as: NITROSTAT Place 0.4 mg under the tongue every 5 (five) minutes as needed for chest pain.   NON FORMULARY Diet Type:  Liberalized due to poor appetite to Mech soft   NUTRITIONAL SUPPLEMENT PO Take 1 each by mouth 2 (two) times a day. Magic Cup - take with lunch and  dinner   ondansetron 4 MG tablet Commonly known as: ZOFRAN Take 4 mg by mouth every 4 (four) hours as needed for nausea.   oxybutynin 5 MG tablet Commonly known as: DITROPAN Take 2.5 mg by mouth 2 (two) times daily. 0.5 tablet to = 2.5 mg   polyethylene glycol 17 g packet Commonly known as: MIRALAX / GLYCOLAX Take 17 g by mouth daily as needed for moderate constipation.   predniSONE 1 MG tablet Commonly known as: DELTASONE Take 1 mg by mouth daily with breakfast.   ProAir HFA 108 (90 Base) MCG/ACT inhaler Generic drug: albuterol Inhale 2 puffs into the lungs every 6 (six) hours as needed for wheezing or shortness of breath.   rOPINIRole 1 MG tablet Commonly known as: REQUIP 1mg  at noon, 1mg  2 hours before bed, then 1mg  at bedtime   senna-docusate 8.6-50 MG tablet Commonly known as: Senokot-S Take 1 tablet by mouth 2 (two) times daily.   Silenor 6 MG Tabs Generic drug: Doxepin HCl Take 0.5 tablets (3 mg total) by mouth at bedtime.   traMADol 50 MG tablet Commonly known as: ULTRAM Take 1 tablet (50 mg total) by mouth 2 (two) times daily.   Tums 500 MG chewable tablet Generic drug: calcium  carbonate Chew 1 tablet by mouth 3 (three) times daily as needed for indigestion or heartburn.   Vitamin D3 1.25 MG (50000 UT) Caps Take 1 capsule by mouth once a week.   warfarin 2 MG tablet Commonly known as: COUMADIN Take 2 mg by mouth daily.       Review of Systems   This is limited secondary to dementia provided by nursing as well.  General no complaints of fever chills her weight appears to be stable.  Skin no complaints of rashes or itching no increased bruising beyond baseline.  Head ears eyes nose mouth and throat is not complaining of visual changes or sore throat.  Respiratory no complaints of coughing or shortness of breath.  Cardiac no complaints of chest pain or edema appears to be well controlled.  GI no complaints of abdominal pain nausea vomiting diarrhea constipation.  GU no complaints of dysuria.  Musculoskeletal she not complaining of any joint pain.  Neurologic no complaints of headache or dizziness or syncope or numbness.  And psych does have a history of dementia and anxiety but this appears to be very well controlled.    Immunization History  Administered Date(s) Administered  . Influenza-Unspecified 08/01/2016, 08/11/2017, 07/02/2018, 08/02/2019  . Moderna SARS-COVID-2 Vaccination 11/01/2019, 11/29/2019  . PPD Test 03/18/2017  . Pneumococcal Conjugate-13 09/17/2017  . Pneumococcal Polysaccharide-23 04/17/2019   Pertinent  Health Maintenance Due  Topic Date Due  . INFLUENZA VACCINE  06/01/2020  . DEXA SCAN  Completed  . PNA vac Low Risk Adult  Completed   Fall Risk  03/14/2018  Falls in the past year? No   Functional Status Survey:    Vitals:   01/31/20 1248  BP: 128/60  Pulse: 68  Resp: 17  Temp: (!) 97.4 F (36.3 C)  TempSrc: Oral  SpO2: 97%  Weight: 132 lb (59.9 kg)  Height: 5\' 1"  (1.549 m)   Body mass index is 24.94 kg/m. Physical Exam   In general this is a pleasant elderly female in no distress lying in bed  comfortably she is coloring in her book.  Her skin is warm and dry.  Eyes visual acuity appears to be intact sclera and conjunctive are clear.  Oropharynx is clear mucous membranes  moist.  Chest is clear to auscultation there is no labored breathing.  Heart is regular rate and rhythm in the 60s today she has mild baseline lower extremity edema bilaterally.  Abdomen is soft nontender with positive bowel sounds.  Musculoskeletal Limited exam since she is in bed but does move her upper extremities at baseline does have lower extremity weakness at baseline as well.  Neurologic appears grossly intact cannot appreciate lateralizing findings her speech is clear.  Psych she is pleasant and cooperative oriented to self only she does follow simple verbal commands.    Labs reviewed: Recent Labs    11/23/19 0000 11/26/19 0000 12/03/19 0000  NA 138 136* 143  K 3.9 5.3 3.7  CL 106 103 109*  CO2 16 18 18   BUN 21 23* 15  CREATININE 0.9 1.0 0.8  CALCIUM 8.8 8.8 9.1   Recent Labs    06/12/19 0000  AST 14  ALT 12  ALKPHOS 49   Recent Labs    11/23/19 0000 11/26/19 0000 12/03/19 0000  WBC 18.2 14.7 8.9  NEUTROABS 18 12 7   HGB 11.1* 9.4* 11.6*  HCT 35* 29* 36  PLT 312 224 461*   Lab Results  Component Value Date   TSH 1.71 08/01/2018   Lab Results  Component Value Date   HGBA1C 5.7 08/01/2018   Lab Results  Component Value Date   CHOL 140 08/01/2018   HDL 46 08/01/2018   LDLCALC 80 08/01/2018   TRIG 82 08/01/2018   CHOLHDL 2.4 03/03/2012    Significant Diagnostic Results in last 30 days:  No results found.  Assessment/Plan  #1 history of dementia with anxiety this appears well controlled on Aricept 10 mg a day her weight is stable.  She continues on Xanax 0.25 mg a day for anxiety.  2.  History of coronary artery disease she does not complain of any chest pain or concerning edema- continues on Coumadin.  Most recent HDL was 80.  She has orders for  nitroglycerin but I do not believe she uses this much if any.  3.  Hypertension as noted above this appears to be controlled on Cozaar 50 mg a day and Norvasc 10 mg a day.  4.  History of overactive bladder as noted above her Ditropan has been reduced to 2.5 mg twice daily apparently this is still effective will monitor for now.  5.  History of restless leg syndrome she is on Requip 3 times a day including 1 mg at noon as well as 1 mg later in the day twice daily.  6.  History of left femoral fracture she got that repaired in February 2020-she does not really complain of any pain she does have orders for tramadol 50 mg twice daily.  7.  History of intermittent bradycardia this appears well-tolerated and asymptomatic occasionally she has pulses in the 50s it is in the 60s today.  8.  History of polymyalgia rheumatica this appears well controlled on prednisone 1 mg a day.  9.  History of DVT pulmonary embolism she continues on Coumadin INR is therapeutic at 2.0 update INR is pending she is currently on 2 mg of Coumadin today.  10.  History of insomnia she continues on Silenor 3 mg nightly this appears to be well-tolerated.  [id number]

## 2020-02-01 DIAGNOSIS — N183 Chronic kidney disease, stage 3 unspecified: Secondary | ICD-10-CM | POA: Diagnosis not present

## 2020-02-01 DIAGNOSIS — M6281 Muscle weakness (generalized): Secondary | ICD-10-CM | POA: Diagnosis not present

## 2020-02-01 DIAGNOSIS — R2681 Unsteadiness on feet: Secondary | ICD-10-CM | POA: Diagnosis not present

## 2020-02-02 ENCOUNTER — Encounter: Payer: Self-pay | Admitting: Internal Medicine

## 2020-02-03 ENCOUNTER — Encounter: Payer: Self-pay | Admitting: Internal Medicine

## 2020-02-04 ENCOUNTER — Non-Acute Institutional Stay (SKILLED_NURSING_FACILITY): Payer: Medicare Other | Admitting: Internal Medicine

## 2020-02-04 DIAGNOSIS — Z7901 Long term (current) use of anticoagulants: Secondary | ICD-10-CM

## 2020-02-04 DIAGNOSIS — Z86711 Personal history of pulmonary embolism: Secondary | ICD-10-CM

## 2020-02-04 DIAGNOSIS — N183 Chronic kidney disease, stage 3 unspecified: Secondary | ICD-10-CM | POA: Diagnosis not present

## 2020-02-04 DIAGNOSIS — R2681 Unsteadiness on feet: Secondary | ICD-10-CM | POA: Diagnosis not present

## 2020-02-04 DIAGNOSIS — M6281 Muscle weakness (generalized): Secondary | ICD-10-CM | POA: Diagnosis not present

## 2020-02-04 NOTE — Progress Notes (Signed)
Location:  [place] of Service:  SNF (31)  Hennie Duos, MD  Patient Care Team: Hennie Duos, MD as PCP - General (Internal Medicine)  Extended Emergency Contact Information Primary Emergency Contact: Cassels,[name] Address: [address]          [place], [place] [address] [name] of [place] Phone: [phone] Mobile Phone: [phone] Relation: Son Secondary Emergency Contact: [place] of [place] Phone: [phone] Relation: Daughter    Allergies: Ambien [zolpidem tartrate], Codeine, Codeine phosphate, Darvocet [propoxyphene n-acetaminophen], Fish allergy, Fish-derived products, Hydrocodone, Promethazine, Promethazine hcl, Sulfamethoxazole, Vicodin [hydrocodone-acetaminophen], Amoxicillin, and Penicillins  Chief Complaint  Patient presents with  . Anticoagulation    Patient seen for Coumadin management    HPI: Patient is 86 y.o. female with history of PE and DVT who is on Coumadin who is being seen for an INR check.  Patient's INR is 2.5 which is therapeutic.  Past Medical History:  Diagnosis Date  . Anemia 03/02/2012  . Angina pectoris, unstable ([place] [place]) 03/02/2012  . Anxiety   . Axillary abscess   . Bradycardia 06/13/2012  . CAD (coronary artery disease)    S/p PTCA / stenting (last cath 2004, multiple LAD stents, 2 stents in the right coronary artery all patent)  . Cancer ([place])   . Complication of anesthesia   . Compression fracture of L2 ([place] [facility]) 03/12/2017  . Dementia ([place])   . Dysphagia 10/06/2012  . Dyspnea 02/03/2012  . GERD (gastroesophageal reflux disease)   . History of pulmonary embolus (PE) 03/19/2017  . HTN (hypertension)   . PMR (polymyalgia rheumatica) (HCC)   . PONV (postoperative nausea and vomiting)   . Restless leg syndrome   . 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 Started coumadin  . Weakness of both legs 06/09/2012    Past Surgical History:  Procedure  Laterality Date  . BREAST LUMPECTOMY    . COLECTOMY    . ESOPHAGOGASTRODUODENOSCOPY (EGD) WITH ESOPHAGEAL DILATION  10/10/2012   Procedure: ESOPHAGOGASTRODUODENOSCOPY (EGD) WITH ESOPHAGEAL DILATION;  Surgeon: Inda Castle, MD;  Location: [facility];  Service: Endoscopy;  Laterality: N/A;  . FEMUR IM NAIL Left 12/13/2018   Procedure: INTRAMEDULLARY (IM) NAIL FEMORAL LEFT;  Surgeon: Paralee Cancel, MD;  [facility]: [facility];  Service: Orthopedics;  Laterality: Left;  . heart stents  x 8  . INCISION AND DRAINAGE     bilateral axillary, non specific staff  . INCISION AND DRAINAGE ABSCESS  09/28/2012   Procedure: INCISION AND DRAINAGE ABSCESS;  Surgeon: Zenovia Jarred, MD;  Location: [facility];  Service: General;  Laterality: Bilateral;  . stent     cardiac x 8 stents.    Allergies as of 02/04/2020      Reactions   Ambien [zolpidem Tartrate] Nausea And Vomiting   Codeine    Codeine Phosphate Nausea And Vomiting   Darvocet [propoxyphene N-acetaminophen] Nausea And Vomiting   Fish Allergy    Fish-derived Products    Hydrocodone    Promethazine    Promethazine Hcl Nausea And Vomiting   Sulfamethoxazole Other (See Comments)   Pt doesn't remember reaction   Vicodin [hydrocodone-acetaminophen] Other (See Comments)   Unknown reaction   Amoxicillin Nausea And Vomiting, Rash   Penicillins Nausea And Vomiting, Rash      Medication List       Accurate as of February 04, 2020 11:59 PM. If you have any questions, ask your nurse or doctor.  acetaminophen 325 MG tablet Commonly known as: TYLENOL Take 650 mg by mouth every 6 (six) hours as needed for mild pain, moderate pain or fever.   acetaminophen 325 MG tablet Commonly known as: TYLENOL Take 650 mg by mouth every 6 (six) hours. Take routinely for left hip pain   ALPRAZolam 0.5 MG tablet Commonly known as: XANAX Take 0.5 tablets (0.25 mg total) by mouth daily.   amLODipine 10 MG tablet Commonly known as: NORVASC Take 10 mg by mouth  every morning.   atorvastatin 10 MG tablet Commonly known as: LIPITOR Take 10 mg by mouth every evening.   donepezil 10 MG tablet Commonly known as: ARICEPT Take 10 mg by mouth every evening.   losartan 50 MG tablet Commonly known as: COZAAR Take 50 mg by mouth at bedtime.   nitroGLYCERIN 0.4 MG SL tablet Commonly known as: NITROSTAT Place 0.4 mg under the tongue every 5 (five) minutes as needed for chest pain.   NON FORMULARY Diet Type:  Liberalized due to poor appetite to Mech soft   NUTRITIONAL SUPPLEMENT PO Take 1 each by mouth 2 (two) times a day. Magic Cup - take with lunch and dinner   ondansetron 4 MG tablet Commonly known as: ZOFRAN Take 4 mg by mouth every 4 (four) hours as needed for nausea.   oxybutynin 5 MG tablet Commonly known as: DITROPAN Take 2.5 mg by mouth 2 (two) times daily. 0.5 tablet to = 2.5 mg   polyethylene glycol 17 g packet Commonly known as: MIRALAX / GLYCOLAX Take 17 g by mouth daily as needed for moderate constipation.   predniSONE 1 MG tablet Commonly known as: DELTASONE Take 1 mg by mouth daily with breakfast.   ProAir HFA 108 (90 Base) MCG/ACT inhaler Generic drug: albuterol Inhale 2 puffs into the lungs every 6 (six) hours as needed for wheezing or shortness of breath.   rOPINIRole 1 MG tablet Commonly known as: REQUIP 1mg  at noon, 1mg  2 hours before bed, then 1mg  at bedtime   senna-docusate 8.6-50 MG tablet Commonly known as: Senokot-S Take 1 tablet by mouth 2 (two) times daily.   Silenor 6 MG Tabs Generic drug: Doxepin HCl Take 0.5 tablets (3 mg total) by mouth at bedtime.   traMADol 50 MG tablet Commonly known as: ULTRAM Take 1 tablet (50 mg total) by mouth 2 (two) times daily.   Tums 500 MG chewable tablet Generic drug: calcium carbonate Chew 1 tablet by mouth 3 (three) times daily as needed for indigestion or heartburn.   Vitamin D3 1.25 MG (50000 UT) Caps Take 1 capsule by mouth once a week.   warfarin 2 MG  tablet Commonly known as: COUMADIN Take 2 mg by mouth daily.       No orders of the defined types were placed in this encounter.   Immunization History  Administered Date(s) Administered  . Influenza-Unspecified 08/01/2016, 08/11/2017, 07/02/2018, 08/02/2019  . Moderna SARS-COVID-2 Vaccination 11/01/2019, 11/29/2019  . PPD Test 03/18/2017  . Pneumococcal Conjugate-13 09/17/2017  . Pneumococcal Polysaccharide-23 04/17/2019    Social History   Tobacco Use  . Smoking status: Never Smoker  . Smokeless tobacco: Never Used  Substance Use Topics  . Alcohol use: No     Vitals:   02/04/20 1639  BP: 117/66  Pulse: 65  Resp: 17  Temp: 98.1 F (36.7 C)   Body mass index is 24.94 kg/m.   Patient Active Problem List   Diagnosis Date Noted  . Failure to thrive in adult  01/08/2019  . Moderate protein malnutrition ([place]) 01/08/2019  . Pressure injury of skin 12/18/2018  . Intertrochanteric fracture of left femur ([facility]) 12/11/2018  . CKD (chronic kidney disease), stage III 12/11/2018  . Depression with anxiety 12/11/2018  . Overactive bladder 09/24/2017  . Insomnia 07/31/2017  . Anxiety 06/16/2017  . Citrobacter infection 03/19/2017  . AKI (acute kidney injury) ([facility]) 03/19/2017  . History of pulmonary embolus (PE) 03/19/2017  . Elevated INR 03/19/2017  . Candidal intertrigo 03/19/2017  . Compression fracture of L2 ([place]) 03/12/2017  . Acute lower UTI 03/12/2017  . Accelerated hypertension 03/12/2017  . Leukocytosis 07/31/2015  . Acute bronchitis 07/28/2015  . Anticoagulated on Coumadin 07/28/2015  . Intractable back pain 07/20/2015  . Back pain 07/20/2015  . Cellulitis and abscess of left buttock 04/12/2013  . Stricture and stenosis of esophagus 10/10/2012  . Dementia ([place] [facility]) 10/06/2012  . Dysphagia 10/06/2012  . Axillary abscess - bilateral, multiple 09/27/2012  . Hypotension 08/24/2012  . Near syncope 08/24/2012  . Abscess of axilla, left 06/27/2012  . Bradycardia  06/13/2012  . Abscess 06/11/2012  . Chest pain 06/09/2012  . Cellulitis 06/09/2012  . Weakness of both legs 06/09/2012  . PMR (polymyalgia rheumatica) (HCC)   . Restless leg syndrome   . Angina pectoris, unstable ([place]) 03/02/2012  . Acute blood loss as cause of postoperative anemia 03/02/2012  . Dyspnea 02/03/2012  . UTI (urinary tract infection) 02/03/2012  . CAD (coronary artery disease)   . HTN (hypertension)   . VTE (venous thromboembolism) 10/01/2010  . HYPERCHOLESTEROLEMIA  IIA 08/27/2009  . Hyperlipidemia 12/11/2008  . HYPERTENSION, BENIGN 12/11/2008    CMP     Component Value Date/Time   NA 143 12/03/2019 0000   K 3.7 12/03/2019 0000   CL 109 (A) 12/03/2019 0000   CO2 18 12/03/2019 0000   GLUCOSE 104 (H) 12/17/2018 0417   BUN 15 12/03/2019 0000   CREATININE 0.8 12/03/2019 0000   CREATININE 0.99 12/17/2018 0417   CALCIUM 9.1 12/03/2019 0000   PROT 6.7 03/11/2017 2031   ALBUMIN 3.4 (L) 03/11/2017 2031   AST 14 06/12/2019 0000   ALT 12 06/12/2019 0000   ALKPHOS 49 06/12/2019 0000   BILITOT 0.5 03/11/2017 2031   GFRNONAA 59.56 12/03/2019 0000   GFRAA 69.03 12/03/2019 0000   Recent Labs    11/23/19 0000 11/26/19 0000 12/03/19 0000  NA 138 136* 143  K 3.9 5.3 3.7  CL 106 103 109*  CO2 16 18 18   BUN 21 23* 15  CREATININE 0.9 1.0 0.8  CALCIUM 8.8 8.8 9.1   Recent Labs    06/12/19 0000  AST 14  ALT 12  ALKPHOS 49   Recent Labs    11/23/19 0000 11/26/19 0000 12/03/19 0000  WBC 18.2 14.7 8.9  NEUTROABS 18 12 7   HGB 11.1* 9.4* 11.6*  HCT 35* 29* 36  PLT 312 224 461*   No results for input(s): CHOL, LDLCALC, TRIG in the last 8760 hours.  Invalid input(s): HCL No results found for: [facility] Lab Results  Component Value Date   TSH 1.71 08/01/2018   Lab Results  Component Value Date   HGBA1C 5.7 08/01/2018   Lab Results  Component Value Date   CHOL 140 08/01/2018   HDL 46 08/01/2018   LDLCALC 80 08/01/2018   TRIG 82 08/01/2018    CHOLHDL 2.4 03/03/2012    Significant Diagnostic Results in last 30 days:  No results found.  Assessment and Plan  History of  PE/anticoagulated on Coumadin-INR 2.5 which is therapeutic.  Patient is on 2 mg daily.  We will continue same regimen and recheck INR in 1 week.    Hennie Duos , MD

## 2020-02-05 DIAGNOSIS — M6281 Muscle weakness (generalized): Secondary | ICD-10-CM | POA: Diagnosis not present

## 2020-02-05 DIAGNOSIS — N183 Chronic kidney disease, stage 3 unspecified: Secondary | ICD-10-CM | POA: Diagnosis not present

## 2020-02-05 DIAGNOSIS — R2681 Unsteadiness on feet: Secondary | ICD-10-CM | POA: Diagnosis not present

## 2020-02-06 DIAGNOSIS — R2681 Unsteadiness on feet: Secondary | ICD-10-CM | POA: Diagnosis not present

## 2020-02-06 DIAGNOSIS — M6281 Muscle weakness (generalized): Secondary | ICD-10-CM | POA: Diagnosis not present

## 2020-02-06 DIAGNOSIS — N183 Chronic kidney disease, stage 3 unspecified: Secondary | ICD-10-CM | POA: Diagnosis not present

## 2020-02-07 DIAGNOSIS — N183 Chronic kidney disease, stage 3 unspecified: Secondary | ICD-10-CM | POA: Diagnosis not present

## 2020-02-07 DIAGNOSIS — M6281 Muscle weakness (generalized): Secondary | ICD-10-CM | POA: Diagnosis not present

## 2020-02-07 DIAGNOSIS — R2681 Unsteadiness on feet: Secondary | ICD-10-CM | POA: Diagnosis not present

## 2020-02-08 ENCOUNTER — Encounter: Payer: Self-pay | Admitting: Internal Medicine

## 2020-02-08 DIAGNOSIS — R2681 Unsteadiness on feet: Secondary | ICD-10-CM | POA: Diagnosis not present

## 2020-02-08 DIAGNOSIS — N183 Chronic kidney disease, stage 3 unspecified: Secondary | ICD-10-CM | POA: Diagnosis not present

## 2020-02-08 DIAGNOSIS — M6281 Muscle weakness (generalized): Secondary | ICD-10-CM | POA: Diagnosis not present

## 2020-02-10 ENCOUNTER — Encounter: Payer: Self-pay | Admitting: Internal Medicine

## 2020-02-10 DIAGNOSIS — M6281 Muscle weakness (generalized): Secondary | ICD-10-CM | POA: Diagnosis not present

## 2020-02-10 DIAGNOSIS — N183 Chronic kidney disease, stage 3 unspecified: Secondary | ICD-10-CM | POA: Diagnosis not present

## 2020-02-10 DIAGNOSIS — R2681 Unsteadiness on feet: Secondary | ICD-10-CM | POA: Diagnosis not present

## 2020-02-12 DIAGNOSIS — N183 Chronic kidney disease, stage 3 unspecified: Secondary | ICD-10-CM | POA: Diagnosis not present

## 2020-02-12 DIAGNOSIS — M6281 Muscle weakness (generalized): Secondary | ICD-10-CM | POA: Diagnosis not present

## 2020-02-12 DIAGNOSIS — R2681 Unsteadiness on feet: Secondary | ICD-10-CM | POA: Diagnosis not present

## 2020-02-13 DIAGNOSIS — M6281 Muscle weakness (generalized): Secondary | ICD-10-CM | POA: Diagnosis not present

## 2020-02-13 DIAGNOSIS — N183 Chronic kidney disease, stage 3 unspecified: Secondary | ICD-10-CM | POA: Diagnosis not present

## 2020-02-13 DIAGNOSIS — R2681 Unsteadiness on feet: Secondary | ICD-10-CM | POA: Diagnosis not present

## 2020-02-14 DIAGNOSIS — M6281 Muscle weakness (generalized): Secondary | ICD-10-CM | POA: Diagnosis not present

## 2020-02-14 DIAGNOSIS — N183 Chronic kidney disease, stage 3 unspecified: Secondary | ICD-10-CM | POA: Diagnosis not present

## 2020-02-14 DIAGNOSIS — R2681 Unsteadiness on feet: Secondary | ICD-10-CM | POA: Diagnosis not present

## 2020-02-15 DIAGNOSIS — R2681 Unsteadiness on feet: Secondary | ICD-10-CM | POA: Diagnosis not present

## 2020-02-15 DIAGNOSIS — N183 Chronic kidney disease, stage 3 unspecified: Secondary | ICD-10-CM | POA: Diagnosis not present

## 2020-02-15 DIAGNOSIS — M6281 Muscle weakness (generalized): Secondary | ICD-10-CM | POA: Diagnosis not present

## 2020-02-18 ENCOUNTER — Non-Acute Institutional Stay (SKILLED_NURSING_FACILITY): Payer: Medicare Other | Admitting: Internal Medicine

## 2020-02-18 ENCOUNTER — Encounter: Payer: Self-pay | Admitting: Internal Medicine

## 2020-02-18 DIAGNOSIS — N183 Chronic kidney disease, stage 3 unspecified: Secondary | ICD-10-CM | POA: Diagnosis not present

## 2020-02-18 DIAGNOSIS — Z86711 Personal history of pulmonary embolism: Secondary | ICD-10-CM

## 2020-02-18 DIAGNOSIS — M6281 Muscle weakness (generalized): Secondary | ICD-10-CM | POA: Diagnosis not present

## 2020-02-18 DIAGNOSIS — R2681 Unsteadiness on feet: Secondary | ICD-10-CM | POA: Diagnosis not present

## 2020-02-18 DIAGNOSIS — Z7901 Long term (current) use of anticoagulants: Secondary | ICD-10-CM | POA: Diagnosis not present

## 2020-02-18 NOTE — Progress Notes (Signed)
Location:  [facility] Room Number: 204-D Place of Service:  SNF (31)  Hennie Duos, MD  Patient Care Team: Hennie Duos, MD as PCP - General (Internal Medicine)  Extended Emergency Contact Information Primary Emergency Contact: Rebstock,[name] Address: [address]          [place], [place] [address] [name] of [place] Phone: [phone] Mobile Phone: [phone] Relation: Son Secondary Emergency Contact: [place] of [place] Phone: [phone] Relation: Daughter    Allergies: Ambien [zolpidem tartrate], Codeine, Codeine phosphate, Darvocet [propoxyphene n-acetaminophen], Fish allergy, Fish-derived products, Hydrocodone, Promethazine, Promethazine hcl, Sulfamethoxazole, Vicodin [hydrocodone-acetaminophen], Amoxicillin, and Penicillins  Chief Complaint  Patient presents with  . Anticoagulation    Patient is seen for Coumadin management.    HPI: Patient is a 86 y.o. female who is being seen for an INR check.  Today patient's INR is 2.0 which is therapeutic.  Past Medical History:  Diagnosis Date  . Anemia 03/02/2012  . Angina pectoris, unstable ([place]) 03/02/2012  . Anxiety   . Axillary abscess   . Bradycardia 06/13/2012  . CAD (coronary artery disease)    S/p PTCA / stenting (last cath 2004, multiple LAD stents, 2 stents in the right coronary artery all patent)  . Cancer ([facility])   . Complication of anesthesia   . Compression fracture of L2 ([facility]) 03/12/2017  . Dementia ([facility])   . Dysphagia 10/06/2012  . Dyspnea 02/03/2012  . GERD (gastroesophageal reflux disease)   . History of pulmonary embolus (PE) 03/19/2017  . HTN (hypertension)   . PMR (polymyalgia rheumatica) (HCC)   . PONV (postoperative nausea and vomiting)   . Restless leg syndrome   . 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 Started coumadin  . Weakness of both legs 06/09/2012    Past Surgical History:  Procedure Laterality  Date  . BREAST LUMPECTOMY    . COLECTOMY    . ESOPHAGOGASTRODUODENOSCOPY (EGD) WITH ESOPHAGEAL DILATION  10/10/2012   Procedure: ESOPHAGOGASTRODUODENOSCOPY (EGD) WITH ESOPHAGEAL DILATION;  Surgeon: Inda Castle, MD;  Location: [place];  Service: Endoscopy;  Laterality: N/A;  . FEMUR IM NAIL Left 12/13/2018   Procedure: INTRAMEDULLARY (IM) NAIL FEMORAL LEFT;  Surgeon: Paralee Cancel, MD;  [facility];  Service: Orthopedics;  Laterality: Left;  . heart stents  x 8  . INCISION AND DRAINAGE     bilateral axillary, non specific staff  . INCISION AND DRAINAGE ABSCESS  09/28/2012   Procedure: INCISION AND DRAINAGE ABSCESS;  Surgeon: Zenovia Jarred, MD;  Location: [facility];  Service: General;  Laterality: Bilateral;  . stent     cardiac x 8 stents.    Allergies as of 02/18/2020      Reactions   Ambien [zolpidem Tartrate] Nausea And Vomiting   Codeine    Codeine Phosphate Nausea And Vomiting   Darvocet [propoxyphene N-acetaminophen] Nausea And Vomiting   Fish Allergy    Fish-derived Products    Hydrocodone    Promethazine    Promethazine Hcl Nausea And Vomiting   Sulfamethoxazole Other (See Comments)   Pt doesn't remember reaction   Vicodin [hydrocodone-acetaminophen] Other (See Comments)   Unknown reaction   Amoxicillin Nausea And Vomiting, Rash   Penicillins Nausea And Vomiting, Rash      Medication List       Accurate as of February 18, 2020  4:47 PM. If you have any questions, ask your nurse or doctor.  acetaminophen 325 MG tablet Commonly known as: TYLENOL Take 650 mg by mouth every 6 (six) hours as needed for mild pain, moderate pain or fever.   acetaminophen 325 MG tablet Commonly known as: TYLENOL Take 650 mg by mouth every 6 (six) hours. Take routinely for left hip pain   ALPRAZolam 0.5 MG tablet Commonly known as: XANAX Take 0.5 tablets (0.25 mg total) by mouth daily.   amLODipine 10 MG tablet Commonly known as: NORVASC Take 10 mg by mouth every  morning.   atorvastatin 10 MG tablet Commonly known as: LIPITOR Take 10 mg by mouth every evening.   donepezil 10 MG tablet Commonly known as: ARICEPT Take 10 mg by mouth every evening.   losartan 50 MG tablet Commonly known as: COZAAR Take 50 mg by mouth at bedtime.   nitroGLYCERIN 0.4 MG SL tablet Commonly known as: NITROSTAT Place 0.4 mg under the tongue every 5 (five) minutes as needed for chest pain.   NON FORMULARY Diet Type:  Liberalized due to poor appetite to Mech soft   NUTRITIONAL SUPPLEMENT PO Take 1 each by mouth 2 (two) times a day. Magic Cup - take with lunch and dinner   ondansetron 4 MG tablet Commonly known as: ZOFRAN Take 4 mg by mouth every 4 (four) hours as needed for nausea.   oxybutynin 5 MG tablet Commonly known as: DITROPAN Take 2.5 mg by mouth 2 (two) times daily. 0.5 tablet to = 2.5 mg   polyethylene glycol 17 g packet Commonly known as: MIRALAX / GLYCOLAX Take 17 g by mouth daily as needed for moderate constipation.   predniSONE 1 MG tablet Commonly known as: DELTASONE Take 1 mg by mouth daily with breakfast.   ProAir HFA 108 (90 Base) MCG/ACT inhaler Generic drug: albuterol Inhale 2 puffs into the lungs every 6 (six) hours as needed for wheezing or shortness of breath.   rOPINIRole 1 MG tablet Commonly known as: REQUIP 1mg  at noon, 1mg  2 hours before bed, then 1mg  at bedtime   senna-docusate 8.6-50 MG tablet Commonly known as: Senokot-S Take 1 tablet by mouth 2 (two) times daily.   Silenor 6 MG Tabs Generic drug: Doxepin HCl Take 0.5 tablets (3 mg total) by mouth at bedtime.   traMADol 50 MG tablet Commonly known as: ULTRAM Take 1 tablet (50 mg total) by mouth 2 (two) times daily.   Tums 500 MG chewable tablet Generic drug: calcium carbonate Chew 1 tablet by mouth 3 (three) times daily as needed for indigestion or heartburn.   Vitamin D3 1.25 MG (50000 UT) Caps Take 1 capsule by mouth once a week.   warfarin 2 MG  tablet Commonly known as: COUMADIN Take 2 mg by mouth daily.       No orders of the defined types were placed in this encounter.   Immunization History  Administered Date(s) Administered  . Influenza-Unspecified 08/01/2016, 08/11/2017, 07/02/2018, 08/02/2019  . Moderna SARS-COVID-2 Vaccination 11/01/2019, 11/29/2019  . PPD Test 03/18/2017  . Pneumococcal Conjugate-13 09/17/2017  . Pneumococcal Polysaccharide-23 04/17/2019    Social History   Tobacco Use  . Smoking status: Never Smoker  . Smokeless tobacco: Never Used  Substance Use Topics  . Alcohol use: No     Vitals:   02/18/20 1542  BP: (!) 147/66  Pulse: (!) 51  Resp: 17  Temp: 97.6 F (36.4 C)   Body mass index is 24.94 kg/m.   Patient Active Problem List   Diagnosis Date Noted  . Failure to thrive  in adult 01/08/2019  . Moderate protein malnutrition ([place]) 01/08/2019  . Pressure injury of skin 12/18/2018  . Intertrochanteric fracture of left femur ([place]) 12/11/2018  . CKD (chronic kidney disease), stage III 12/11/2018  . Depression with anxiety 12/11/2018  . Overactive bladder 09/24/2017  . Insomnia 07/31/2017  . Anxiety 06/16/2017  . Citrobacter infection 03/19/2017  . AKI (acute kidney injury) ([place]) 03/19/2017  . History of pulmonary embolus (PE) 03/19/2017  . Elevated INR 03/19/2017  . Candidal intertrigo 03/19/2017  . Compression fracture of L2 ([place]) 03/12/2017  . Acute lower UTI 03/12/2017  . Accelerated hypertension 03/12/2017  . Leukocytosis 07/31/2015  . Acute bronchitis 07/28/2015  . Anticoagulated on Coumadin 07/28/2015  . Intractable back pain 07/20/2015  . Back pain 07/20/2015  . Cellulitis and abscess of left buttock 04/12/2013  . Stricture and stenosis of esophagus 10/10/2012  . Dementia ([place]) 10/06/2012  . Dysphagia 10/06/2012  . Axillary abscess - bilateral, multiple 09/27/2012  . Hypotension 08/24/2012  . Near syncope 08/24/2012  . Abscess of axilla, left 06/27/2012  .  Bradycardia 06/13/2012  . Abscess 06/11/2012  . Chest pain 06/09/2012  . Cellulitis 06/09/2012  . Weakness of both legs 06/09/2012  . PMR (polymyalgia rheumatica) (HCC)   . Restless leg syndrome   . Angina pectoris, unstable ([place]) 03/02/2012  . Acute blood loss as cause of postoperative anemia 03/02/2012  . Dyspnea 02/03/2012  . UTI (urinary tract infection) 02/03/2012  . CAD (coronary artery disease)   . HTN (hypertension)   . VTE (venous thromboembolism) 10/01/2010  . HYPERCHOLESTEROLEMIA  IIA 08/27/2009  . Hyperlipidemia 12/11/2008  . HYPERTENSION, BENIGN 12/11/2008    CMP     Component Value Date/Time   NA 143 12/03/2019 0000   K 3.7 12/03/2019 0000   CL 109 (A) 12/03/2019 0000   CO2 18 12/03/2019 0000   GLUCOSE 104 (H) 12/17/2018 0417   BUN 15 12/03/2019 0000   CREATININE 0.8 12/03/2019 0000   CREATININE 0.99 12/17/2018 0417   CALCIUM 9.1 12/03/2019 0000   PROT 6.7 03/11/2017 2031   ALBUMIN 3.4 (L) 03/11/2017 2031   AST 14 06/12/2019 0000   ALT 12 06/12/2019 0000   ALKPHOS 49 06/12/2019 0000   BILITOT 0.5 03/11/2017 2031   GFRNONAA 59.56 12/03/2019 0000   GFRAA 69.03 12/03/2019 0000   Recent Labs    11/23/19 0000 11/26/19 0000 12/03/19 0000  NA 138 136* 143  K 3.9 5.3 3.7  CL 106 103 109*  CO2 16 18 18   BUN 21 23* 15  CREATININE 0.9 1.0 0.8  CALCIUM 8.8 8.8 9.1   Recent Labs    06/12/19 0000  AST 14  ALT 12  ALKPHOS 49   Recent Labs    11/23/19 0000 11/26/19 0000 12/03/19 0000  WBC 18.2 14.7 8.9  NEUTROABS 18 12 7   HGB 11.1* 9.4* 11.6*  HCT 35* 29* 36  PLT 312 224 461*   No results for input(s): CHOL, LDLCALC, TRIG in the last 8760 hours.  Invalid input(s): HCL No results found for: [facility] Lab Results  Component Value Date   TSH 1.71 08/01/2018   Lab Results  Component Value Date   HGBA1C 5.7 08/01/2018   Lab Results  Component Value Date   CHOL 140 08/01/2018   HDL 46 08/01/2018   LDLCALC 80 08/01/2018   TRIG 82  08/01/2018   CHOLHDL 2.4 03/03/2012    Significant Diagnostic Results in last 30 days:  No results found.  Assessment and Plan  History of PE and DVT/anticoagulated on Coumadin-today patient's INR is 2.0 which is therapeutic.  This is on Coumadin 2 mg daily.  We will continue same regimen and repeat in 1 week     Hennie Duos , MD

## 2020-02-19 DIAGNOSIS — M6281 Muscle weakness (generalized): Secondary | ICD-10-CM | POA: Diagnosis not present

## 2020-02-19 DIAGNOSIS — R2681 Unsteadiness on feet: Secondary | ICD-10-CM | POA: Diagnosis not present

## 2020-02-19 DIAGNOSIS — N183 Chronic kidney disease, stage 3 unspecified: Secondary | ICD-10-CM | POA: Diagnosis not present

## 2020-02-20 DIAGNOSIS — R2681 Unsteadiness on feet: Secondary | ICD-10-CM | POA: Diagnosis not present

## 2020-02-20 DIAGNOSIS — N183 Chronic kidney disease, stage 3 unspecified: Secondary | ICD-10-CM | POA: Diagnosis not present

## 2020-02-20 DIAGNOSIS — M6281 Muscle weakness (generalized): Secondary | ICD-10-CM | POA: Diagnosis not present

## 2020-02-21 DIAGNOSIS — M6281 Muscle weakness (generalized): Secondary | ICD-10-CM | POA: Diagnosis not present

## 2020-02-21 DIAGNOSIS — R2681 Unsteadiness on feet: Secondary | ICD-10-CM | POA: Diagnosis not present

## 2020-02-21 DIAGNOSIS — N183 Chronic kidney disease, stage 3 unspecified: Secondary | ICD-10-CM | POA: Diagnosis not present

## 2020-02-22 DIAGNOSIS — R2681 Unsteadiness on feet: Secondary | ICD-10-CM | POA: Diagnosis not present

## 2020-02-22 DIAGNOSIS — M6281 Muscle weakness (generalized): Secondary | ICD-10-CM | POA: Diagnosis not present

## 2020-02-22 DIAGNOSIS — N183 Chronic kidney disease, stage 3 unspecified: Secondary | ICD-10-CM | POA: Diagnosis not present

## 2020-02-25 ENCOUNTER — Encounter: Payer: Self-pay | Admitting: Internal Medicine

## 2020-02-25 DIAGNOSIS — N183 Chronic kidney disease, stage 3 unspecified: Secondary | ICD-10-CM | POA: Diagnosis not present

## 2020-02-25 DIAGNOSIS — M6281 Muscle weakness (generalized): Secondary | ICD-10-CM | POA: Diagnosis not present

## 2020-02-25 DIAGNOSIS — R2681 Unsteadiness on feet: Secondary | ICD-10-CM | POA: Diagnosis not present

## 2020-02-26 ENCOUNTER — Encounter: Payer: Self-pay | Admitting: Internal Medicine

## 2020-02-26 ENCOUNTER — Non-Acute Institutional Stay (SKILLED_NURSING_FACILITY): Payer: Medicare Other | Admitting: Internal Medicine

## 2020-02-26 DIAGNOSIS — Z86711 Personal history of pulmonary embolism: Secondary | ICD-10-CM | POA: Diagnosis not present

## 2020-02-26 DIAGNOSIS — Z7901 Long term (current) use of anticoagulants: Secondary | ICD-10-CM

## 2020-03-01 ENCOUNTER — Encounter: Payer: Self-pay | Admitting: Internal Medicine

## 2020-03-01 NOTE — Progress Notes (Signed)
Location:  [facility] Room Number: 204-D Place of Service:  SNF (31) 1 Hennie Duos, MD  Patient Care Team: Hennie Duos, MD as PCP - General (Internal Medicine)  Extended Emergency Contact Information Primary Emergency Contact: Etheredge,[name] Address: [address]          [place], [place] [address] [name] of [place] Phone: [phone] Mobile Phone: [phone] Relation: Son Secondary Emergency Contact: [place] of [place] Phone: [phone] Relation: Daughter    Allergies: Ambien [zolpidem tartrate], Codeine, Codeine phosphate, Darvocet [propoxyphene n-acetaminophen], Fish allergy, Fish-derived products, Hydrocodone, Promethazine, Promethazine hcl, Sulfamethoxazole, Vicodin [hydrocodone-acetaminophen], Amoxicillin, and Penicillins  Chief Complaint  Patient presents with  . Anticoagulation    Patient seen for Coumadin management    HPI: Patient is a 86 y.o. female history of PE/VTE who is being seen for an INR check.  Today her INR is 2.5 which is therapeutic.  Past Medical History:  Diagnosis Date  . Anemia 03/02/2012  . Angina pectoris, unstable ([place]) 03/02/2012  . Anxiety   . Axillary abscess   . Bradycardia 06/13/2012  . CAD (coronary artery disease)    S/p PTCA / stenting (last cath 2004, multiple LAD stents, 2 stents in the right coronary artery all patent)  . Cancer ([place])   . Complication of anesthesia   . Compression fracture of L2 ([place]) 03/12/2017  . Dementia ([place])   . Dysphagia 10/06/2012  . Dyspnea 02/03/2012  . GERD (gastroesophageal reflux disease)   . History of pulmonary embolus (PE) 03/19/2017  . HTN (hypertension)   . PMR (polymyalgia rheumatica) (HCC)   . PONV (postoperative nausea and vomiting)   . Restless leg syndrome   . 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 Started coumadin  . Weakness of both legs 06/09/2012    Past Surgical History:  Procedure  Laterality Date  . BREAST LUMPECTOMY    . COLECTOMY    . ESOPHAGOGASTRODUODENOSCOPY (EGD) WITH ESOPHAGEAL DILATION  10/10/2012   Procedure: ESOPHAGOGASTRODUODENOSCOPY (EGD) WITH ESOPHAGEAL DILATION;  Surgeon: Inda Castle, MD;  Location: [place] [facility];  Service: Endoscopy;  Laterality: N/A;  . FEMUR IM NAIL Left 12/13/2018   Procedure: INTRAMEDULLARY (IM) NAIL FEMORAL LEFT;  Surgeon: Paralee Cancel, MD;  Location: [facility];  Service: Orthopedics;  Laterality: Left;  . heart stents  x 8  . INCISION AND DRAINAGE     bilateral axillary, non specific staff  . INCISION AND DRAINAGE ABSCESS  09/28/2012   Procedure: INCISION AND DRAINAGE ABSCESS;  Surgeon: Zenovia Jarred, MD;  Location: [facility];  Service: General;  Laterality: Bilateral;  . stent     cardiac x 8 stents.    Allergies as of 02/26/2020      Reactions   Ambien [zolpidem Tartrate] Nausea And Vomiting   Codeine    Codeine Phosphate Nausea And Vomiting   Darvocet [propoxyphene N-acetaminophen] Nausea And Vomiting   Fish Allergy    Fish-derived Products    Hydrocodone    Promethazine    Promethazine Hcl Nausea And Vomiting   Sulfamethoxazole Other (See Comments)   Pt doesn't remember reaction   Vicodin [hydrocodone-acetaminophen] Other (See Comments)   Unknown reaction   Amoxicillin Nausea And Vomiting, Rash   Penicillins Nausea And Vomiting, Rash      Medication List       Accurate as of February 26, 2020 11:59 PM. If you have any questions, ask your nurse or doctor.  acetaminophen 325 MG tablet Commonly known as: TYLENOL Take 650 mg by mouth every 6 (six) hours as needed for mild pain, moderate pain or fever.   acetaminophen 325 MG tablet Commonly known as: TYLENOL Take 650 mg by mouth every 6 (six) hours. Take routinely for left hip pain   ALPRAZolam 0.5 MG tablet Commonly known as: XANAX Take 0.5 tablets (0.25 mg total) by mouth daily.   amLODipine 10 MG tablet Commonly known as: NORVASC Take 10 mg by  mouth every morning.   atorvastatin 10 MG tablet Commonly known as: LIPITOR Take 10 mg by mouth every evening.   donepezil 10 MG tablet Commonly known as: ARICEPT Take 10 mg by mouth every evening.   losartan 50 MG tablet Commonly known as: COZAAR Take 50 mg by mouth at bedtime.   nitroGLYCERIN 0.4 MG SL tablet Commonly known as: NITROSTAT Place 0.4 mg under the tongue every 5 (five) minutes as needed for chest pain.   NON FORMULARY Diet Type:  Liberalized due to poor appetite to Mech soft   NUTRITIONAL SUPPLEMENT PO Take 1 each by mouth 2 (two) times a day. Magic Cup - take with lunch and dinner   ondansetron 4 MG tablet Commonly known as: ZOFRAN Take 4 mg by mouth every 4 (four) hours as needed for nausea.   oxybutynin 5 MG tablet Commonly known as: DITROPAN Take 2.5 mg by mouth 2 (two) times daily. 0.5 tablet to = 2.5 mg   polyethylene glycol 17 g packet Commonly known as: MIRALAX / GLYCOLAX Take 17 g by mouth daily as needed for moderate constipation.   predniSONE 1 MG tablet Commonly known as: DELTASONE Take 1 mg by mouth daily with breakfast.   ProAir HFA 108 (90 Base) MCG/ACT inhaler Generic drug: albuterol Inhale 2 puffs into the lungs every 6 (six) hours as needed for wheezing or shortness of breath.   rOPINIRole 1 MG tablet Commonly known as: REQUIP 1mg  at noon, 1mg  2 hours before bed, then 1mg  at bedtime   senna-docusate 8.6-50 MG tablet Commonly known as: Senokot-S Take 1 tablet by mouth 2 (two) times daily.   Silenor 6 MG Tabs Generic drug: Doxepin HCl Take 0.5 tablets (3 mg total) by mouth at bedtime.   traMADol 50 MG tablet Commonly known as: ULTRAM Take 1 tablet (50 mg total) by mouth 2 (two) times daily.   Tums 500 MG chewable tablet Generic drug: calcium carbonate Chew 1 tablet by mouth 3 (three) times daily as needed for indigestion or heartburn.   Vitamin D3 1.25 MG (50000 UT) Caps Take 1 capsule by mouth once a week.   warfarin 2  MG tablet Commonly known as: COUMADIN Take 2 mg by mouth daily.       No orders of the defined types were placed in this encounter.   Immunization History  Administered Date(s) Administered  . Influenza-Unspecified 08/01/2016, 08/11/2017, 07/02/2018, 08/02/2019  . Moderna SARS-COVID-2 Vaccination 11/01/2019, 11/29/2019  . PPD Test 03/18/2017  . Pneumococcal Conjugate-13 09/17/2017  . Pneumococcal Polysaccharide-23 04/17/2019    Social History   Tobacco Use  . Smoking status: Never Smoker  . Smokeless tobacco: Never Used  Substance Use Topics  . Alcohol use: No     Vitals:   02/26/20 1626  BP: 138/82  Pulse: (!) 57  Resp: 17  Temp: (!) 97 F (36.1 C)   Body mass index is 24.77 kg/m.   Patient Active Problem List   Diagnosis Date Noted  . Failure to thrive  in adult 01/08/2019  . Moderate protein malnutrition ([place]) 01/08/2019  . Pressure injury of skin 12/18/2018  . Intertrochanteric fracture of left femur ([place]) 12/11/2018  . CKD (chronic kidney disease), stage III 12/11/2018  . Depression with anxiety 12/11/2018  . Overactive bladder 09/24/2017  . Insomnia 07/31/2017  . Anxiety 06/16/2017  . Citrobacter infection 03/19/2017  . AKI (acute kidney injury) ([place]) 03/19/2017  . History of pulmonary embolus (PE) 03/19/2017  . Elevated INR 03/19/2017  . Candidal intertrigo 03/19/2017  . Compression fracture of L2 ([place]) 03/12/2017  . Acute lower UTI 03/12/2017  . Accelerated hypertension 03/12/2017  . Leukocytosis 07/31/2015  . Acute bronchitis 07/28/2015  . Anticoagulated on Coumadin 07/28/2015  . Intractable back pain 07/20/2015  . Back pain 07/20/2015  . Cellulitis and abscess of left buttock 04/12/2013  . Stricture and stenosis of esophagus 10/10/2012  . Dementia ([place]) 10/06/2012  . Dysphagia 10/06/2012  . Axillary abscess - bilateral, multiple 09/27/2012  . Hypotension 08/24/2012  . Near syncope 08/24/2012  . Abscess of axilla, left 06/27/2012  .  Bradycardia 06/13/2012  . Abscess 06/11/2012  . Chest pain 06/09/2012  . Cellulitis 06/09/2012  . Weakness of both legs 06/09/2012  . PMR (polymyalgia rheumatica) (HCC)   . Restless leg syndrome   . Angina pectoris, unstable ([place]) 03/02/2012  . Acute blood loss as cause of postoperative anemia 03/02/2012  . Dyspnea 02/03/2012  . UTI (urinary tract infection) 02/03/2012  . CAD (coronary artery disease)   . HTN (hypertension)   . VTE (venous thromboembolism) 10/01/2010  . HYPERCHOLESTEROLEMIA  IIA 08/27/2009  . Hyperlipidemia 12/11/2008  . HYPERTENSION, BENIGN 12/11/2008    CMP     Component Value Date/Time   NA 143 12/03/2019 0000   K 3.7 12/03/2019 0000   CL 109 (A) 12/03/2019 0000   CO2 18 12/03/2019 0000   GLUCOSE 104 (H) 12/17/2018 0417   BUN 15 12/03/2019 0000   CREATININE 0.8 12/03/2019 0000   CREATININE 0.99 12/17/2018 0417   CALCIUM 9.1 12/03/2019 0000   PROT 6.7 03/11/2017 2031   ALBUMIN 3.4 (L) 03/11/2017 2031   AST 14 06/12/2019 0000   ALT 12 06/12/2019 0000   ALKPHOS 49 06/12/2019 0000   BILITOT 0.5 03/11/2017 2031   GFRNONAA 59.56 12/03/2019 0000   GFRAA 69.03 12/03/2019 0000   Recent Labs    11/23/19 0000 11/26/19 0000 12/03/19 0000  NA 138 136* 143  K 3.9 5.3 3.7  CL 106 103 109*  CO2 16 18 18   BUN 21 23* 15  CREATININE 0.9 1.0 0.8  CALCIUM 8.8 8.8 9.1   Recent Labs    06/12/19 0000  AST 14  ALT 12  ALKPHOS 49   Recent Labs    11/23/19 0000 11/26/19 0000 12/03/19 0000  WBC 18.2 14.7 8.9  NEUTROABS 18 12 7   HGB 11.1* 9.4* 11.6*  HCT 35* 29* 36  PLT 312 224 461*   No results for input(s): CHOL, LDLCALC, TRIG in the last 8760 hours.  Invalid input(s): HCL No results found for: [facility] Lab Results  Component Value Date   TSH 1.71 08/01/2018   Lab Results  Component Value Date   HGBA1C 5.7 08/01/2018   Lab Results  Component Value Date   CHOL 140 08/01/2018   HDL 46 08/01/2018   LDLCALC 80 08/01/2018   TRIG 82  08/01/2018   CHOLHDL 2.4 03/03/2012    Significant Diagnostic Results in last 30 days:  No results found.  Assessment and Plan  History of PE/anticoagulated on Coumadin-patient's INR today is 2.5 which is therapeutic on Coumadin 2 mg daily; will continue Coumadin 2 mg daily and repeat INR in 1 week.    Hennie Duos , MD

## 2020-03-03 ENCOUNTER — Encounter: Payer: Self-pay | Admitting: Internal Medicine

## 2020-03-03 ENCOUNTER — Non-Acute Institutional Stay (SKILLED_NURSING_FACILITY): Payer: Medicare Other | Admitting: Internal Medicine

## 2020-03-03 DIAGNOSIS — Z86711 Personal history of pulmonary embolism: Secondary | ICD-10-CM

## 2020-03-03 DIAGNOSIS — Z7901 Long term (current) use of anticoagulants: Secondary | ICD-10-CM

## 2020-03-03 MED ORDER — TRAMADOL HCL 50 MG PO TABS: 50.0000 mg | ORAL_TABLET | Freq: Two times a day (BID) | ORAL | 0 refills | Status: DC

## 2020-03-03 MED ORDER — ALPRAZOLAM 0.5 MG PO TABS: 0.2500 mg | ORAL_TABLET | Freq: Every day | ORAL | 0 refills | Status: DC

## 2020-03-03 NOTE — Progress Notes (Signed)
Location:  [facility] Room Number: 204-D Place of Service:  SNF (31)  Hennie Duos, MD  Patient Care Team: Hennie Duos, MD as PCP - General (Internal Medicine)  Extended Emergency Contact Information Primary Emergency Contact: Schadler,[name] Address: [address]          [place], [place] [address] [name] of [place] Phone: [phone] Mobile Phone: [phone] Relation: Son Secondary Emergency Contact: [place] of [place] Phone: [phone] Relation: Daughter    Allergies: Ambien [zolpidem tartrate], Codeine, Codeine phosphate, Darvocet [propoxyphene n-acetaminophen], Fish allergy, Fish-derived products, Hydrocodone, Promethazine, Promethazine hcl, Sulfamethoxazole, Vicodin [hydrocodone-acetaminophen], Amoxicillin, and Penicillins  Chief Complaint  Patient presents with  . Acute Visit    HPI: Patient is 86 y.o. female with history of PE on Coumadin who is being seen for an INR check.  Today patient's INR is 2.5 which is therapeutic.  Past Medical History:  Diagnosis Date  . Anemia 03/02/2012  . Angina pectoris, unstable ([place]) 03/02/2012  . Anxiety   . Axillary abscess   . Bradycardia 06/13/2012  . CAD (coronary artery disease)    S/p PTCA / stenting (last cath 2004, multiple LAD stents, 2 stents in the right coronary artery all patent)  . Cancer ([place])   . Complication of anesthesia   . Compression fracture of L2 ([place]) 03/12/2017  . Dementia ([place])   . Dysphagia 10/06/2012  . Dyspnea 02/03/2012  . GERD (gastroesophageal reflux disease)   . History of pulmonary embolus (PE) 03/19/2017  . HTN (hypertension)   . PMR (polymyalgia rheumatica) (HCC)   . PONV (postoperative nausea and vomiting)   . Restless leg syndrome   . 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 Started coumadin  . Weakness of both legs 06/09/2012    Past Surgical History:  Procedure Laterality Date  . BREAST LUMPECTOMY      . COLECTOMY    . ESOPHAGOGASTRODUODENOSCOPY (EGD) WITH ESOPHAGEAL DILATION  10/10/2012   Procedure: ESOPHAGOGASTRODUODENOSCOPY (EGD) WITH ESOPHAGEAL DILATION;  Surgeon: Inda Castle, MD;  Location: [facility];  Service: Endoscopy;  Laterality: N/A;  . FEMUR IM NAIL Left 12/13/2018   Procedure: INTRAMEDULLARY (IM) NAIL FEMORAL LEFT;  Surgeon: Paralee Cancel, MD;  Location[facility];  Service: Orthopedics;  Laterality: Left;  . heart stents  x 8  . INCISION AND DRAINAGE     bilateral axillary, non specific staff  . INCISION AND DRAINAGE ABSCESS  09/28/2012   Procedure: INCISION AND DRAINAGE ABSCESS;  Surgeon: Zenovia Jarred, MD;  Location: [facility];  Service: General;  Laterality: Bilateral;  . stent     cardiac x 8 stents.    Allergies as of 03/03/2020      Reactions   Ambien [zolpidem Tartrate] Nausea And Vomiting   Codeine    Codeine Phosphate Nausea And Vomiting   Darvocet [propoxyphene N-acetaminophen] Nausea And Vomiting   Fish Allergy    Fish-derived Products    Hydrocodone    Promethazine    Promethazine Hcl Nausea And Vomiting   Sulfamethoxazole Other (See Comments)   Pt doesn't remember reaction   Vicodin [hydrocodone-acetaminophen] Other (See Comments)   Unknown reaction   Amoxicillin Nausea And Vomiting, Rash   Penicillins Nausea And Vomiting, Rash      Medication List       Accurate as of Mar 03, 2020 11:22 PM. If you have any questions, ask your nurse or doctor.        acetaminophen 325  MG tablet Commonly known as: TYLENOL Take 650 mg by mouth every 6 (six) hours as needed for mild pain, moderate pain or fever.   acetaminophen 325 MG tablet Commonly known as: TYLENOL Take 650 mg by mouth every 6 (six) hours. Take routinely for left hip pain   ALPRAZolam 0.5 MG tablet Commonly known as: XANAX Take 0.5 tablets (0.25 mg total) by mouth daily.   amLODipine 10 MG tablet Commonly known as: NORVASC Take 10 mg by mouth every morning.   atorvastatin 10 MG  tablet Commonly known as: LIPITOR Take 10 mg by mouth every evening.   donepezil 10 MG tablet Commonly known as: ARICEPT Take 10 mg by mouth every evening.   losartan 50 MG tablet Commonly known as: COZAAR Take 50 mg by mouth at bedtime.   nitroGLYCERIN 0.4 MG SL tablet Commonly known as: NITROSTAT Place 0.4 mg under the tongue every 5 (five) minutes as needed for chest pain.   NON FORMULARY Diet Type:  Liberalized due to poor appetite to Mech soft   NUTRITIONAL SUPPLEMENT PO Take 1 each by mouth 2 (two) times a day. Magic Cup - take with lunch and dinner   ondansetron 4 MG tablet Commonly known as: ZOFRAN Take 4 mg by mouth every 4 (four) hours as needed for nausea.   oxybutynin 5 MG tablet Commonly known as: DITROPAN Take 2.5 mg by mouth 2 (two) times daily. 0.5 tablet to = 2.5 mg   polyethylene glycol 17 g packet Commonly known as: MIRALAX / GLYCOLAX Take 17 g by mouth daily as needed for moderate constipation.   predniSONE 1 MG tablet Commonly known as: DELTASONE Take 1 mg by mouth daily with breakfast.   ProAir HFA 108 (90 Base) MCG/ACT inhaler Generic drug: albuterol Inhale 2 puffs into the lungs every 6 (six) hours as needed for wheezing or shortness of breath.   rOPINIRole 1 MG tablet Commonly known as: REQUIP 1mg  at noon, 1mg  2 hours before bed, then 1mg  at bedtime   senna-docusate 8.6-50 MG tablet Commonly known as: Senokot-S Take 1 tablet by mouth 2 (two) times daily.   Silenor 6 MG Tabs Generic drug: Doxepin HCl Take 0.5 tablets (3 mg total) by mouth at bedtime.   traMADol 50 MG tablet Commonly known as: ULTRAM Take 1 tablet (50 mg total) by mouth 2 (two) times daily.   Tums 500 MG chewable tablet Generic drug: calcium carbonate Chew 1 tablet by mouth 3 (three) times daily as needed for indigestion or heartburn.   Vitamin D3 1.25 MG (50000 UT) Caps Take 1 capsule by mouth once a week.   warfarin 2 MG tablet Commonly known as:  COUMADIN Take 2 mg by mouth daily.       Meds ordered this encounter  Medications  . ALPRAZolam (XANAX) 0.5 MG tablet    Sig: Take 0.5 tablets (0.25 mg total) by mouth daily.    Dispense:  60 tablet    Refill:  0  . traMADol (ULTRAM) 50 MG tablet    Sig: Take 1 tablet (50 mg total) by mouth 2 (two) times daily.    Dispense:  60 tablet    Refill:  0    Immunization History  Administered Date(s) Administered  . Influenza-Unspecified 08/01/2016, 08/11/2017, 07/02/2018, 08/02/2019  . Moderna SARS-COVID-2 Vaccination 11/01/2019, 11/29/2019  . PPD Test 03/18/2017  . Pneumococcal Conjugate-13 09/17/2017  . Pneumococcal Polysaccharide-23 04/17/2019    Social History   Tobacco Use  . Smoking status: Never Smoker  .  Smokeless tobacco: Never Used  Substance Use Topics  . Alcohol use: No     Vitals:   03/03/20 1606  BP: 117/70  Pulse: 71  Resp: 17  Temp: (!) 96.9 F (36.1 C)   Body mass index is 24.77 kg/m.   Patient Active Problem List   Diagnosis Date Noted  . Failure to thrive in adult 01/08/2019  . Moderate protein malnutrition ([place]) 01/08/2019  . Pressure injury of skin 12/18/2018  . Intertrochanteric fracture of left femur ([place]) 12/11/2018  . CKD (chronic kidney disease), stage III 12/11/2018  . Depression with anxiety 12/11/2018  . Overactive bladder 09/24/2017  . Insomnia 07/31/2017  . Anxiety 06/16/2017  . Citrobacter infection 03/19/2017  . AKI (acute kidney injury) ([place]) 03/19/2017  . History of pulmonary embolus (PE) 03/19/2017  . Elevated INR 03/19/2017  . Candidal intertrigo 03/19/2017  . Compression fracture of L2 ([place]) 03/12/2017  . Acute lower UTI 03/12/2017  . Accelerated hypertension 03/12/2017  . Leukocytosis 07/31/2015  . Acute bronchitis 07/28/2015  . Anticoagulated on Coumadin 07/28/2015  . Intractable back pain 07/20/2015  . Back pain 07/20/2015  . Cellulitis and abscess of left buttock 04/12/2013  . Stricture and stenosis of  esophagus 10/10/2012  . Dementia ([place]) 10/06/2012  . Dysphagia 10/06/2012  . Axillary abscess - bilateral, multiple 09/27/2012  . Hypotension 08/24/2012  . Near syncope 08/24/2012  . Abscess of axilla, left 06/27/2012  . Bradycardia 06/13/2012  . Abscess 06/11/2012  . Chest pain 06/09/2012  . Cellulitis 06/09/2012  . Weakness of both legs 06/09/2012  . PMR (polymyalgia rheumatica) (HCC)   . Restless leg syndrome   . Angina pectoris, unstable ([place]) 03/02/2012  . Acute blood loss as cause of postoperative anemia 03/02/2012  . Dyspnea 02/03/2012  . UTI (urinary tract infection) 02/03/2012  . CAD (coronary artery disease)   . HTN (hypertension)   . VTE (venous thromboembolism) 10/01/2010  . HYPERCHOLESTEROLEMIA  IIA 08/27/2009  . Hyperlipidemia 12/11/2008  . HYPERTENSION, BENIGN 12/11/2008    CMP     Component Value Date/Time   NA 143 12/03/2019 0000   K 3.7 12/03/2019 0000   CL 109 (A) 12/03/2019 0000   CO2 18 12/03/2019 0000   GLUCOSE 104 (H) 12/17/2018 0417   BUN 15 12/03/2019 0000   CREATININE 0.8 12/03/2019 0000   CREATININE 0.99 12/17/2018 0417   CALCIUM 9.1 12/03/2019 0000   PROT 6.7 03/11/2017 2031   ALBUMIN 3.4 (L) 03/11/2017 2031   AST 14 06/12/2019 0000   ALT 12 06/12/2019 0000   ALKPHOS 49 06/12/2019 0000   BILITOT 0.5 03/11/2017 2031   GFRNONAA 59.56 12/03/2019 0000   GFRAA 69.03 12/03/2019 0000   Recent Labs    11/23/19 0000 11/26/19 0000 12/03/19 0000  NA 138 136* 143  K 3.9 5.3 3.7  CL 106 103 109*  CO2 16 18 18   BUN 21 23* 15  CREATININE 0.9 1.0 0.8  CALCIUM 8.8 8.8 9.1   Recent Labs    06/12/19 0000  AST 14  ALT 12  ALKPHOS 49   Recent Labs    11/23/19 0000 11/26/19 0000 12/03/19 0000  WBC 18.2 14.7 8.9  NEUTROABS 18 12 7   HGB 11.1* 9.4* 11.6*  HCT 35* 29* 36  PLT 312 224 461*   No results for input(s): CHOL, LDLCALC, TRIG in the last 8760 hours.  Invalid input(s): HCL No results found for: [facility] Lab Results   Component Value Date   TSH 1.71 08/01/2018   Lab  Results  Component Value Date   HGBA1C 5.7 08/01/2018   Lab Results  Component Value Date   CHOL 140 08/01/2018   HDL 46 08/01/2018   LDLCALC 80 08/01/2018   TRIG 82 08/01/2018   CHOLHDL 2.4 03/03/2012    Significant Diagnostic Results in last 30 days:  No results found.  Assessment and Plan  History of PE/anticoagulated on Coumadin-patient's INR is 2.5 which is therapeutic on Coumadin 2 mg daily; continue current dose of 2 mg daily and repeat INR 1 week    Hennie Duos , MD

## 2020-03-05 DIAGNOSIS — F5101 Primary insomnia: Secondary | ICD-10-CM | POA: Diagnosis not present

## 2020-03-05 DIAGNOSIS — F411 Generalized anxiety disorder: Secondary | ICD-10-CM | POA: Diagnosis not present

## 2020-03-05 DIAGNOSIS — F0390 Unspecified dementia without behavioral disturbance: Secondary | ICD-10-CM | POA: Diagnosis not present

## 2020-03-10 ENCOUNTER — Non-Acute Institutional Stay (SKILLED_NURSING_FACILITY): Payer: Medicare Other | Admitting: Internal Medicine

## 2020-03-10 DIAGNOSIS — I8290 Acute embolism and thrombosis of unspecified vein: Secondary | ICD-10-CM

## 2020-03-10 DIAGNOSIS — R791 Abnormal coagulation profile: Secondary | ICD-10-CM

## 2020-03-10 NOTE — Progress Notes (Signed)
This is an acute visit.  Level care scale.  [facility]  Chief complaint acute visit secondary to elevated INR-on chronic anticoagulation with history of pulmonary embolism.  As well as DVT  History of present illness.  Patient is a pleasant 86 year old female with a history of pulmonary embolism and DVT-she is on chronic Coumadin-INR today is elevated at 3.4-she has been on Coumadin 2 mg a day late March has been quite stable with INRs between 2 and 2.5 however it is elevated at 3.4 today-INR was 2.5 on May 3.  There is been no evidence of any increased bruising or bleeding-as usual she is in her room coloring and appears to be doing well-nursing does not report any issues her vital signs appear to be stable.  Past Medical History:  Diagnosis Date  . Anemia 03/02/2012  . Angina pectoris, unstable ([place]) 03/02/2012  . Anxiety   . Axillary abscess   . Bradycardia 06/13/2012  . CAD (coronary artery disease)    S/p PTCA / stenting (last cath 2004, multiple LAD stents, 2 stents in the right coronary artery all patent)  . Cancer ([place])   . Complication of anesthesia   . Compression fracture of L2 ([place]) 03/12/2017  . Dementia ([place])   . Dysphagia 10/06/2012  . Dyspnea 02/03/2012  . GERD (gastroesophageal reflux disease)   . History of pulmonary embolus (PE) 03/19/2017  . HTN (hypertension)   . PMR (polymyalgia rheumatica) (HCC)   . PONV (postoperative nausea and vomiting)   . Restless leg syndrome   . 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 Started coumadin  . Weakness of both legs 06/09/2012         Past Surgical History:  Procedure Laterality Date  . BREAST LUMPECTOMY    . COLECTOMY    . ESOPHAGOGASTRODUODENOSCOPY (EGD) WITH ESOPHAGEAL DILATION  10/10/2012   Procedure: ESOPHAGOGASTRODUODENOSCOPY (EGD) WITH ESOPHAGEAL DILATION;  Surgeon: Inda Castle, MD;  Location: [facility];  Service: Endoscopy;  Laterality: N/A;  . FEMUR IM NAIL  Left 12/13/2018   Procedure: INTRAMEDULLARY (IM) NAIL FEMORAL LEFT;  Surgeon: Paralee Cancel, MD;  Location: [facility];  Service: Orthopedics;  Laterality: Left;  . heart stents  x 8  . INCISION AND DRAINAGE     bilateral axillary, non specific staff  . INCISION AND DRAINAGE ABSCESS  09/28/2012   Procedure: INCISION AND DRAINAGE ABSCESS;  Surgeon: Zenovia Jarred, MD;  Location: [facility];  Service: General;  Laterality: Bilateral;  . stent     cardiac x 8 stents.         Allergies     Reactions   Ambien [zolpidem Tartrate] Nausea And Vomiting   Codeine    Codeine Phosphate Nausea And Vomiting   Darvocet [propoxyphene N-acetaminophen] Nausea And Vomiting   Fish Allergy    Fish-derived Products    Hydrocodone    Promethazine    Promethazine Hcl Nausea And Vomiting   Sulfamethoxazole Other (See Comments)   Pt doesn't remember reaction   Vicodin [hydrocodone-acetaminophen] Other (See Comments)   Unknown reaction   Amoxicillin Nausea And Vomiting, Rash   Penicillins Nausea And Vomiting, Rash         Medication List             acetaminophen 325 MG tablet Commonly known as: TYLENOL Take 650 mg by mouth every 6 (six) hours as needed for mild pain, moderate pain or fever.   acetaminophen 325 MG tablet Commonly known  as: TYLENOL Take 650 mg by mouth every 6 (six) hours. Take routinely for left hip pain   ALPRAZolam 0.5 MG tablet Commonly known as: XANAX Take 0.5 tablets (0.25 mg total) by mouth daily.   amLODipine 10 MG tablet Commonly known as: NORVASC Take 10 mg by mouth every morning.   atorvastatin 10 MG tablet Commonly known as: LIPITOR Take 10 mg by mouth every evening.   donepezil 10 MG tablet Commonly known as: ARICEPT Take 10 mg by mouth every evening.   losartan 50 MG tablet Commonly known as: COZAAR Take 50 mg by mouth at bedtime.   nitroGLYCERIN 0.4 MG SL tablet Commonly known as: NITROSTAT Place 0.4 mg  under the tongue every 5 (five) minutes as needed for chest pain.   NON FORMULARY Diet Type:  Liberalized due to poor appetite to Mech soft   NUTRITIONAL SUPPLEMENT PO Take 1 each by mouth 2 (two) times a day. Magic Cup - take with lunch and dinner   ondansetron 4 MG tablet Commonly known as: ZOFRAN Take 4 mg by mouth every 4 (four) hours as needed for nausea.   oxybutynin 5 MG tablet Commonly known as: DITROPAN Take 2.5 mg by mouth 2 (two) times daily. 0.5 tablet to = 2.5 mg   polyethylene glycol 17 g packet Commonly known as: MIRALAX / GLYCOLAX Take 17 g by mouth daily as needed for moderate constipation.   predniSONE 1 MG tablet Commonly known as: DELTASONE Take 1 mg by mouth daily with breakfast.   ProAir HFA 108 (90 Base) MCG/ACT inhaler Generic drug: albuterol Inhale 2 puffs into the lungs every 6 (six) hours as needed for wheezing or shortness of breath.   rOPINIRole 1 MG tablet Commonly known as: REQUIP 1mg  at noon, 1mg  2 hours before bed, then 1mg  at bedtime   senna-docusate 8.6-50 MG tablet Commonly known as: Senokot-S Take 1 tablet by mouth 2 (two) times daily.   Silenor 6 MG Tabs Generic drug: Doxepin HCl Take 0.5 tablets (3 mg total) by mouth at bedtime.   traMADol 50 MG tablet Commonly known as: ULTRAM Take 1 tablet (50 mg total) by mouth 2 (two) times daily.   Tums 500 MG chewable tablet Generic drug: calcium carbonate Chew 1 tablet by mouth 3 (three) times daily as needed for indigestion or heartburn.   Vitamin D3 1.25 MG (50000 UT) Caps Take 1 capsule by mouth once a week.   warfarin 2 MG tablet Commonly known as: COUMADIN Take 2 mg by mouth daily.              Immunization History  Administered Date(s) Administered  . Influenza-Unspecified 08/01/2016, 08/11/2017, 07/02/2018, 08/02/2019  . Moderna SARS-COVID-2 Vaccination 11/01/2019, 11/29/2019  . PPD Test 03/18/2017  . Pneumococcal Conjugate-13 09/17/2017  .  Pneumococcal Polysaccharide-23 04/17/2019    Social History       Tobacco Use  . Smoking status: Never Smoker  . Smokeless tobacco: Never Used  Substance Use Topics  . Alcohol use: No    Review of systems.  This is limited secondary to dementia but she is not complaining of any bruising or bleeding does not complain of having any chest pain or shortness of breath states she is doing all right   Physical exam.  Temperature is 96.9-pulse 77 respirations 16 blood pressure 107/77  In general this is a pleasant elderly female no distress sitting in her chair coloring in her book as usual.  Her skin is warm and dry.-I do not see  any evidence of increased bruising or bleeding  Eyes visual acuity appears to be intact sclera and conjunctive are clear.  Oropharynx clear mucous membranes moist.  Chest is clear to auscultation there is no labored breathing.  Heart is regular rate and rhythm she has her baseline lower extremity edema.  Abdomen is somewhat obese soft nontender with positive bowel sounds.  Musculoskeletal is able to move all extremities x4 with baseline lower extremity weakness does not appear to have any trouble using her upper extremities to color.  Neurologic appears grossly intact cannot appreciate lateralizing findings her speech is clear.  Psych she continues to be pleasant and appropriate does follow simple verbal commands    Labs.  December 03, 2019.  WBC 8.9 hemoglobin 11.6 platelets 401.  Sodium 143 potassium 3.7 BUN 14.8 creatinine 0.83.  Assessment and plan.  1.  Anticoagulation management on chronic Coumadin with history of DVT-pulmonary embolism-INR is somewhat elevated at 3.4 today-previously had been therapeutic for an extended period of time on 2 mg a day-we will hold Coumadin tonight and recheck an INR tomorrow-I do not see any evidence of any increased bruising or bleeding-will await updated INR tomorrow.  [id number]

## 2020-03-11 ENCOUNTER — Encounter: Payer: Self-pay | Admitting: Internal Medicine

## 2020-03-13 ENCOUNTER — Non-Acute Institutional Stay (SKILLED_NURSING_FACILITY): Payer: Medicare Other | Admitting: Internal Medicine

## 2020-03-13 DIAGNOSIS — I8290 Acute embolism and thrombosis of unspecified vein: Secondary | ICD-10-CM

## 2020-03-13 DIAGNOSIS — Z7901 Long term (current) use of anticoagulants: Secondary | ICD-10-CM | POA: Diagnosis not present

## 2020-03-13 DIAGNOSIS — Z86711 Personal history of pulmonary embolism: Secondary | ICD-10-CM | POA: Diagnosis not present

## 2020-03-13 NOTE — Progress Notes (Signed)
This is an acute visit.  Level care skilled.  Facility is satisfied.  Chief complaint is acute visit follow-up of anticoagulation management.  On chronic Coumadin with history of DVT and pulmonary embolism.  History of present illness.  Patient is a pleasant 86 year old female with a history of pulmonary embolism DVT-has been on chronic Coumadin and fairly stable recently on 2 mg a day.  However INR earlier this week was elevated at 3.4-next day it was 3.1-it has been held a couple additional days and repeat INR today shows her INR now is 1.3.  Therehass been no evidence of increased bruising or bleeding-she is in her bed today appears to be comfortable and at her baseline with no complaints.  Past Medical History:  Diagnosis Date  . Anemia 03/02/2012  . Angina pectoris, unstable ([place]) 03/02/2012  . Anxiety   . Axillary abscess   . Bradycardia 06/13/2012  . CAD (coronary artery disease)    S/p PTCA / stenting (last cath 2004, multiple LAD stents, 2 stents in the right coronary artery all patent)  . Cancer ([place])   . Complication of anesthesia   . Compression fracture of L2 ([place]) 03/12/2017  . Dementia ([place])   . Dysphagia 10/06/2012  . Dyspnea 02/03/2012  . GERD (gastroesophageal reflux disease)   . History of pulmonary embolus (PE) 03/19/2017  . HTN (hypertension)   . PMR (polymyalgia rheumatica) (HCC)   . PONV (postoperative nausea and vomiting)   . Restless leg syndrome   . 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 Started coumadin  . Weakness of both legs 06/09/2012         Past Surgical History:  Procedure Laterality Date  . BREAST LUMPECTOMY    . COLECTOMY    . ESOPHAGOGASTRODUODENOSCOPY (EGD) WITH ESOPHAGEAL DILATION  10/10/2012   Procedure: ESOPHAGOGASTRODUODENOSCOPY (EGD) WITH ESOPHAGEAL DILATION; Surgeon: Inda Castle, MD; Location: [facility]; Service: Endoscopy; Laterality: N/A;  . FEMUR IM NAIL Left 12/13/2018    Procedure: INTRAMEDULLARY (IM) NAIL FEMORAL LEFT; Surgeon: Paralee Cancel, MD; Location: [facility]; Service: Orthopedics; Laterality: Left;  . heart stents  x 8  . INCISION AND DRAINAGE     bilateral axillary, non specific staff  . INCISION AND DRAINAGE ABSCESS  09/28/2012   Procedure: INCISION AND DRAINAGE ABSCESS; Surgeon: Zenovia Jarred, MD; Location: [facility]; Service: General; Laterality: Bilateral;  . stent     cardiac x 8 stents.                 Allergies    Reactions   Ambien [zolpidem Tartrate] Nausea And Vomiting   Codeine    Codeine Phosphate Nausea And Vomiting   Darvocet [propoxyphene N-acetaminophen] Nausea And Vomiting   Fish Allergy    Fish-derived Products    Hydrocodone    Promethazine    Promethazine Hcl Nausea And Vomiting   Sulfamethoxazole Other (See Comments)   Pt doesn't remember reaction   Vicodin [hydrocodone-acetaminophen] Other (See Comments)   Unknown reaction   Amoxicillin Nausea And Vomiting, Rash   Penicillins Nausea And Vomiting, Rash           Medication List                       acetaminophen325 MG tablet Commonly known as: TYLENOL Take 650 mg by mouth every 6 (six) hours as needed for mild pain, moderate pain or fever.     acetaminophen325 MG tablet Commonly known as: TYLENOL Take 650 mg  by mouth every 6 (six) hours. Take routinely for left hip pain     ALPRAZolam0.5 MG tablet Commonly known as: XANAX Take 0.5 tablets (0.25 mg total) by mouth daily.     amLODipine10 MG tablet Commonly known as: NORVASC Take 10 mg by mouth every morning.     atorvastatin10 MG tablet Commonly known as: LIPITOR Take 10 mg by mouth every evening.     donepezil10 MG tablet Commonly known as: ARICEPT Take 10 mg by mouth every evening.     losartan50 MG tablet Commonly known as: COZAAR Take 50 mg by mouth at bedtime.     nitroGLYCERIN0.4 MG SL tablet Commonly known as:  NITROSTAT Place 0.4 mg under the tongue every 5 (five) minutes as needed for chest pain.     NON FORMULARY Diet Type: Liberalized due to poor appetite to Mech soft     NUTRITIONAL SUPPLEMENT PO Take 1 each by mouth 2 (two) times a day. Magic Cup - take with lunch and dinner     ondansetron4 MG tablet Commonly known as: ZOFRAN Take 4 mg by mouth every 4 (four) hours as needed for nausea.     oxybutynin5 MG tablet Commonly known as: DITROPAN Take 2.5 mg by mouth 2 (two) times daily. 0.5 tablet to = 2.5 mg     polyethylene glycol17 g packet Commonly known as: MIRALAX / GLYCOLAX Take 17 g by mouth daily as needed for moderate constipation.     predniSONE1 MG tablet Commonly known as: DELTASONE Take 1 mg by mouth daily with breakfast.     ProAir HFA108 (90 Base) MCG/ACT inhaler Generic drug: albuterol Inhale 2 puffs into the lungs every 6 (six) hours as needed for wheezing or shortness of breath.     rOPINIRole1 MG tablet Commonly known as: REQUIP 1mg  at noon, 1mg  2 hours before bed, then 1mg  at bedtime     senna-docusate8.6-50 MG tablet Commonly known as: Senokot-S Take 1 tablet by mouth 2 (two) times daily.     Silenor6 MG Tabs Generic drug: Doxepin HCl Take 0.5 tablets (3 mg total) by mouth at bedtime.     traMADol50 MG tablet Commonly known as: ULTRAM Take 1 tablet (50 mg total) by mouth 2 (two) times daily.     Tums500 MG chewable tablet Generic drug: calcium carbonate Chew 1 tablet by mouth 3 (three) times daily as needed for indigestion or heartburn.     Vitamin D31.25 MG (50000 UT) Caps Take 1 capsule by mouth once a week.                        Immunization History  Administered Date(s) Administered  . Influenza-Unspecified 08/01/2016, 08/11/2017, 07/02/2018, 08/02/2019  . Moderna SARS-COVID-2 Vaccination 11/01/2019, 11/29/2019  . PPD Test 03/18/2017  . Pneumococcal Conjugate-13 09/17/2017  . Pneumococcal  Polysaccharide-23 04/17/2019    Social History       Tobacco Use  . Smoking status: Never Smoker  . Smokeless tobacco: Never Used  Substance Use Topics  . Alcohol use: No    Review of systems.   This is quite limited secondary to dementia but she is not complaining of any shortness of breath chest pain I do not note any increased bruising or bleeding   Physical exam.  Temperature is 97.8 pulse 58 respiration 17 blood pressure 108/58.  General this is a pleasant elderly female no distress lying comfortably in bed she appears to be at her baseline she is bright and alert.  Skin is warm and dry do not see any any evidence of increasing bruising or bleeding.  Eyes visual acuity appears to be intact her sclera and conjunctive are clear.  Oropharynx clear mucous membranes moist.  Chest is clear to auscultation with no labored breathing.  Heart is regular rate and rhythm slightly bradycardic in the high 50s today on auscultation she has baseline lower extremity edema.   Abdomen is soft nontender with positive bowel sounds it is obese at baseline.  Musculoskeletal moves all extremities at baseline with continued lower extremity weakness.  Neurologic appears grossly intact cannot appreciate lateralizing findings.  Psych she is oriented to self she is pleasant appropriate.  Labs.  Mar 13, 2020-.  INR 1.3.  Mar 11, 2020-INR 3.1.  Mar 10, 2020.  INR 3.4. \  December 03, 2019.  WBC 8.9 hemoglobin 11.6 platelets 401.  Sodium 143 potassium 3.7 BUN 14.8 creatinine 0.83.  Assessment and plan.  Anticoagulation management with history of supratherapeutic INR now subtherapeutic at 1.3.  Somewhat challenging situation-INR is now below 1.5 so we will give her 2.5 mg of Coumadin tonight and then 2 mg a day-previously she had been quite stable on the 2 mg dose-this will need close monitoring and we will recheck this on Monday, May 17.  If INR is therapeutic at  that time suspect we will continue 2 mg but if rises fairly quickly may need to even back off from that dose  Clinically she appears to be stable in regards to DVT and pulmonary embolism no complaints of chest pain shortness of breath or increased bruising or bleeding.  [id number]

## 2020-03-14 ENCOUNTER — Encounter: Payer: Self-pay | Admitting: Internal Medicine

## 2020-03-17 ENCOUNTER — Non-Acute Institutional Stay (SKILLED_NURSING_FACILITY): Payer: Medicare Other | Admitting: Internal Medicine

## 2020-03-17 ENCOUNTER — Encounter: Payer: Self-pay | Admitting: Internal Medicine

## 2020-03-17 DIAGNOSIS — I8290 Acute embolism and thrombosis of unspecified vein: Secondary | ICD-10-CM

## 2020-03-17 DIAGNOSIS — Z86711 Personal history of pulmonary embolism: Secondary | ICD-10-CM

## 2020-03-17 DIAGNOSIS — Z7901 Long term (current) use of anticoagulants: Secondary | ICD-10-CM | POA: Diagnosis not present

## 2020-03-17 NOTE — Progress Notes (Signed)
Location:    [facility] Room Number: 204/D Place of Service:  SNF [id number]) Provider:  Henreitta Leber, MD  Patient Care Team: Hennie Duos, MD as PCP - General (Internal Medicine)  Extended Emergency Contact Information Primary Emergency Contact: Stanko,[name] Address: [place], [place] [address] [name] of [place] Phone: [phone] Mobile Phone: [phone] Relation: Son Secondary Emergency Contact: [place] Phone: [phone] Relation: Daughter  Code Status: DNR Goals of care: Advanced Directive information Advanced Directives 03/03/2020  Does Patient Have a Medical Advance Directive? Yes  Type of Advance Directive Out of facility DNR (pink MOST or yellow form);[place];Living will  Does patient want to make changes to medical advance directive? No - Patient declined  Copy of [place] in Chart? Yes - validated most recent copy scanned in chart (See row information)  Pre-existing out of facility DNR order (yellow form or pink MOST form) Yellow form placed in chart (order not valid for inpatient use)     Chief Complaint  Patient presents with  . Anticoagulation    Anticoagulation Management  With history of pulmonary embolism as well as history of DVT-on chronic Coumadin  HPI:  Pt is a 86 y.o. female seen today for an acute visit for anticoagulation management-she is on chronic Coumadin with a history of PE and DVT in the past.  Recently she has been pretty stable on 2 mg a day but INR last week on May 13 showed it was down to 1.3.  This was after her Coumadin was held secondary to rising up to 3.4.  On restart however it was subtherapeutic.  INR today however #normalized at 2.0.  On May 13 she did receive 2.5 mg and then was continued on 2 mg a day-.  Currently she is on no signs of increased bruising and  bleeding-she is lying in her bed comfortably does not really have any complaints.    Past Medical History:  Diagnosis Date  . Anemia 03/02/2012  . Angina pectoris, unstable ([place]) 03/02/2012  . Anxiety   . Axillary abscess   . Bradycardia 06/13/2012  . CAD (coronary artery disease)    S/p PTCA / stenting (last cath 2004, multiple LAD stents, 2 stents in the right coronary artery all patent)  . Cancer ([place])   . Complication of anesthesia   . Compression fracture of L2 ([place]) 03/12/2017  . Dementia ([place])   . Dysphagia 10/06/2012  . Dyspnea 02/03/2012  . GERD (gastroesophageal reflux disease)   . History of pulmonary embolus (PE) 03/19/2017  . HTN (hypertension)   . PMR (polymyalgia rheumatica) (HCC)   . PONV (postoperative nausea and vomiting)   . Restless leg syndrome   . 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 Started coumadin  . Weakness of both legs 06/09/2012   Past Surgical History:  Procedure Laterality Date  . BREAST LUMPECTOMY    . COLECTOMY    . ESOPHAGOGASTRODUODENOSCOPY (EGD) WITH ESOPHAGEAL DILATION  10/10/2012   Procedure: ESOPHAGOGASTRODUODENOSCOPY (EGD) WITH ESOPHAGEAL DILATION;  Surgeon: Inda Castle, MD;  Location: [facility];  Service: Endoscopy;  Laterality: N/A;  . FEMUR IM NAIL Left 12/13/2018   Procedure: INTRAMEDULLARY (IM) NAIL FEMORAL LEFT;  Surgeon: Paralee Cancel, MD;  Location: [facility];  Service: Orthopedics;  Laterality: Left;  . heart stents  x 8  .  INCISION AND DRAINAGE     bilateral axillary, non specific staff  . INCISION AND DRAINAGE ABSCESS  09/28/2012   Procedure: INCISION AND DRAINAGE ABSCESS;  Surgeon: Zenovia Jarred, MD;  Location: [facility];  Service: General;  Laterality: Bilateral;  . stent     cardiac x 8 stents.    Allergies  Allergen Reactions  . Ambien [Zolpidem Tartrate] Nausea And Vomiting  . Codeine   . Codeine Phosphate Nausea And Vomiting  . Darvocet [Propoxyphene N-Acetaminophen] Nausea And Vomiting  . Fish Allergy    . Fish-Derived Products   . Hydrocodone   . Promethazine   . Promethazine Hcl Nausea And Vomiting  . Sulfamethoxazole Other (See Comments)    Pt doesn't remember reaction  . Vicodin [Hydrocodone-Acetaminophen] Other (See Comments)    Unknown reaction  . Amoxicillin Nausea And Vomiting and Rash  . Penicillins Nausea And Vomiting and Rash    Outpatient Encounter Medications as of 03/17/2020  Medication Sig  . acetaminophen (TYLENOL) 325 MG tablet Take 650 mg by mouth every 6 (six) hours as needed for mild pain, moderate pain or fever.  Marland Kitchen acetaminophen (TYLENOL) 325 MG tablet Take 650 mg by mouth every 6 (six) hours. Take routinely for left hip pain  . albuterol (PROAIR HFA) 108 (90 Base) MCG/ACT inhaler Inhale 2 puffs into the lungs every 6 (six) hours as needed for wheezing or shortness of breath.  . ALPRAZolam (XANAX) 0.5 MG tablet Take 0.5 tablets (0.25 mg total) by mouth daily.  Marland Kitchen amLODipine (NORVASC) 10 MG tablet Take 10 mg by mouth every morning.   Marland Kitchen atorvastatin (LIPITOR) 10 MG tablet Take 10 mg by mouth every evening.  . calcium carbonate (TUMS) 500 MG chewable tablet Chew 1 tablet by mouth 3 (three) times daily as needed for indigestion or heartburn.  . Cholecalciferol (VITAMIN D3) 1.25 MG (50000 UT) CAPS Take 1 capsule by mouth once a week.  . donepezil (ARICEPT) 10 MG tablet Take 10 mg by mouth every evening.   Marland Kitchen losartan (COZAAR) 50 MG tablet Take 50 mg by mouth at bedtime.   . nitroGLYCERIN (NITROSTAT) 0.4 MG SL tablet Place 0.4 mg under the tongue every 5 (five) minutes as needed for chest pain.  . NON FORMULARY Diet Type:  Liberalized due to poor appetite to Mech soft  . Nutritional Supplements (NUTRITIONAL SUPPLEMENT PO) Take 1 each by mouth 2 (two) times a day. Magic Cup - take with lunch and dinner  . ondansetron (ZOFRAN) 4 MG tablet Take 4 mg by mouth every 4 (four) hours as needed for nausea.   . polyethylene glycol (MIRALAX / GLYCOLAX) packet Take 17 g by mouth daily as  needed for moderate constipation.  . predniSONE (DELTASONE) 1 MG tablet Take 1 mg by mouth daily with breakfast.   . rOPINIRole (REQUIP) 1 MG tablet 1mg  at noon, 1mg  2 hours before bed, then 1mg  at bedtime  . senna-docusate (SENOKOT-S) 8.6-50 MG tablet Take 1 tablet by mouth 2 (two) times daily.  Marland Kitchen SILENOR 6 MG TABS Take 0.5 tablets (3 mg total) by mouth at bedtime.  . traMADol (ULTRAM) 50 MG tablet Take 1 tablet (50 mg total) by mouth 2 (two) times daily.  Marland Kitchen warfarin (COUMADIN) 2 MG tablet Take 2 mg by mouth daily.   . [DISCONTINUED] oxybutynin (DITROPAN) 5 MG tablet Take 2.5 mg by mouth 2 (two) times daily. 0.5 tablet to = 2.5 mg   No facility-administered encounter medications on file as of 03/17/2020.  Review of Systems   This is limited secondary to dementia-please see HPI-again patient has no complaints-- nursing has not reported any recent concerns Immunization History  Administered Date(s) Administered  . Influenza-Unspecified 08/01/2016, 08/11/2017, 07/02/2018, 08/02/2019  . Moderna SARS-COVID-2 Vaccination 11/01/2019, 11/29/2019  . PPD Test 03/18/2017  . Pneumococcal Conjugate-13 09/17/2017  . Pneumococcal Polysaccharide-23 04/17/2019   Pertinent  Health Maintenance Due  Topic Date Due  . INFLUENZA VACCINE  06/01/2020  . DEXA SCAN  Completed  . PNA vac Low Risk Adult  Completed   Fall Risk  03/14/2018  Falls in the past year? No   Functional Status Survey:    Vitals:   03/17/20 1555  BP: (!) 142/56  Pulse: 81  Resp: 17  Temp: (!) 97.3 F (36.3 C)  TempSrc: Oral  SpO2: 97%  Weight: 137 lb 3.2 oz (62.2 kg)  Height: 5\' 1"  (1.549 m)   Body mass index is 25.92 kg/m. Physical Exam   In general this is a pleasant elderly female in no distress she is resting comfortably.  Her skin is warm and dry do not note any new bruising or evidence of bleeding.  Eyes visual acuity appears to be intact sclera and conjunctive are clear.  Oropharynx is clear mucous  membranes moist.  Chest is clear to auscultation there is no labored breathing.  Heart is regular rate and rhythm-continues to have mild lower extremity edema.  Abdomen is soft nontender with positive bowel sounds.  Musculoskeletal moves all extremities at baseline-limited exam since she is in bed.  Neurologic appears grossly intact her speech is clear.  Psych she is oriented to self continues to be pleasant and appropriate does follow simple verbal commands  Labs reviewed:  Mar 17, 2020.  INR 2.0.  Mar 13, 2020-.  INR 1.3.  Mar 11, 2020-INR 3.1.  Mar 10, 2020.  INR 3.4. Recent Labs    11/23/19 0000 11/26/19 0000 12/03/19 0000  NA 138 136* 143  K 3.9 5.3 3.7  CL 106 103 109*  CO2 16 18 18   BUN 21 23* 15  CREATININE 0.9 1.0 0.8  CALCIUM 8.8 8.8 9.1   Recent Labs    06/12/19 0000  AST 14  ALT 12  ALKPHOS 49   Recent Labs    11/23/19 0000 11/26/19 0000 12/03/19 0000  WBC 18.2 14.7 8.9  NEUTROABS 18 12 7   HGB 11.1* 9.4* 11.6*  HCT 35* 29* 36  PLT 312 224 461*   Lab Results  Component Value Date   TSH 1.71 08/01/2018   Lab Results  Component Value Date   HGBA1C 5.7 08/01/2018   Lab Results  Component Value Date   CHOL 140 08/01/2018   HDL 46 08/01/2018   LDLCALC 80 08/01/2018   TRIG 82 08/01/2018   CHOLHDL 2.4 03/03/2012    Significant Diagnostic Results in last 30 days:  No results found.  Assessment/Plan  #1 history of DVT and PE-on chronic Coumadin-INR is now therapeutic at 2.0-we will continue 2 mg a day and recheck this later than week on Thursday, May 20.  Clinically she appears to be stable.  [id number]

## 2020-03-18 ENCOUNTER — Encounter: Payer: Self-pay | Admitting: Internal Medicine

## 2020-03-27 ENCOUNTER — Non-Acute Institutional Stay (SKILLED_NURSING_FACILITY): Payer: Medicare Other | Admitting: Internal Medicine

## 2020-03-27 DIAGNOSIS — Z5181 Encounter for therapeutic drug level monitoring: Secondary | ICD-10-CM

## 2020-03-27 DIAGNOSIS — Z86718 Personal history of other venous thrombosis and embolism: Secondary | ICD-10-CM

## 2020-03-27 DIAGNOSIS — Z7901 Long term (current) use of anticoagulants: Secondary | ICD-10-CM | POA: Diagnosis not present

## 2020-03-27 DIAGNOSIS — Z86711 Personal history of pulmonary embolism: Secondary | ICD-10-CM | POA: Diagnosis not present

## 2020-03-27 NOTE — Progress Notes (Signed)
This is an acute visit.  Level of care skilled.  [facility]  Chief complaint acute visit for anticoagulation management with history of pulmonary embolism and DVT.  History of present illness.  Patient is a 86 year old female seen today for anticoagulation management-she is on chronic Coumadin with a history of PE and DVT.  She is currently on 2 mg a day and has been generally stable on this but at 1 point earlier this mont her INR was down to 1.3--this was after her Coumadin had been held secondary to rising INR that peaked at 3.4.  On restart however INR was subtherapeutic.  INR did normalize on May 17 at 2.0 and we have ordered another INR for today and has come back at 2.3.  Currently currently-as usual-she is lying in her bed comfortably she has no complaints there is been no increase bleeding or increased bruising from baseline.  Vital signs continue to be stable.  Past Medical History:  Diagnosis Date  . Anemia 03/02/2012  . Angina pectoris, unstable ([place]) 03/02/2012  . Anxiety   . Axillary abscess   . Bradycardia 06/13/2012  . CAD (coronary artery disease)    S/p PTCA / stenting (last cath 2004, multiple LAD stents, 2 stents in the right coronary artery all patent)  . Cancer ([place])   . Complication of anesthesia   . Compression fracture of L2 ([place]) 03/12/2017  . Dementia ([place])   . Dysphagia 10/06/2012  . Dyspnea 02/03/2012  . GERD (gastroesophageal reflux disease)   . History of pulmonary embolus (PE) 03/19/2017  . HTN (hypertension)   . PMR (polymyalgia rheumatica) (HCC)   . PONV (postoperative nausea and vomiting)   . Restless leg syndrome   . 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 Started coumadin  . Weakness of both legs 06/09/2012        Past Surgical History:  Procedure Laterality Date  . BREAST LUMPECTOMY    . COLECTOMY    . ESOPHAGOGASTRODUODENOSCOPY (EGD) WITH ESOPHAGEAL DILATION  10/10/2012   Procedure:  ESOPHAGOGASTRODUODENOSCOPY (EGD) WITH ESOPHAGEAL DILATION;  Surgeon: Inda Castle, MD;  Location: [facility];  Service: Endoscopy;  Laterality: N/A;  . FEMUR IM NAIL Left 12/13/2018   Procedure: INTRAMEDULLARY (IM) NAIL FEMORAL LEFT;  Surgeon: Paralee Cancel, MD;  [facility];  Service: Orthopedics;  Laterality: Left;  . heart stents  x 8  . INCISION AND DRAINAGE     bilateral axillary, non specific staff  . INCISION AND DRAINAGE ABSCESS  09/28/2012   Procedure: INCISION AND DRAINAGE ABSCESS;  Surgeon: Zenovia Jarred, MD;  Location: [facility];  Service: General;  Laterality: Bilateral;  . stent     cardiac x 8 stents.         Allergies  Allergen Reactions  . Ambien [Zolpidem Tartrate] Nausea And Vomiting  . Codeine   . Codeine Phosphate Nausea And Vomiting  . Darvocet [Propoxyphene N-Acetaminophen] Nausea And Vomiting  . Fish Allergy   . Fish-Derived Products   . Hydrocodone   . Promethazine   . Promethazine Hcl Nausea And Vomiting  . Sulfamethoxazole Other (See Comments)    Pt doesn't remember reaction  . Vicodin [Hydrocodone-Acetaminophen] Other (See Comments)    Unknown reaction  . Amoxicillin Nausea And Vomiting and Rash  . Penicillins Nausea And Vomiting and Rash      MEDICATIONS     Medication Sig  . acetaminophen (TYLENOL) 325 MG tablet Take 650 mg by mouth  every 6 (six) hours as needed for mild pain, moderate pain or fever.  Marland Kitchen acetaminophen (TYLENOL) 325 MG tablet Take 650 mg by mouth every 6 (six) hours. Take routinely for left hip pain  . albuterol (PROAIR HFA) 108 (90 Base) MCG/ACT inhaler Inhale 2 puffs into the lungs every 6 (six) hours as needed for wheezing or shortness of breath.  . ALPRAZolam (XANAX) 0.5 MG tablet Take 0.5 tablets (0.25 mg total) by mouth daily.  Marland Kitchen amLODipine (NORVASC) 10 MG tablet Take 10 mg by mouth every morning.   Marland Kitchen atorvastatin (LIPITOR) 10 MG tablet Take 10 mg by mouth every evening.  . calcium carbonate  (TUMS) 500 MG chewable tablet Chew 1 tablet by mouth 3 (three) times daily as needed for indigestion or heartburn.  . Cholecalciferol (VITAMIN D3) 1.25 MG (50000 UT) CAPS Take 1 capsule by mouth once a week.  . donepezil (ARICEPT) 10 MG tablet Take 10 mg by mouth every evening.   Marland Kitchen losartan (COZAAR) 50 MG tablet Take 50 mg by mouth at bedtime.   . nitroGLYCERIN (NITROSTAT) 0.4 MG SL tablet Place 0.4 mg under the tongue every 5 (five) minutes as needed for chest pain.  . NON FORMULARY Diet Type:  Liberalized due to poor appetite to Mech soft  . Nutritional Supplements (NUTRITIONAL SUPPLEMENT PO) Take 1 each by mouth 2 (two) times a day. Magic Cup - take with lunch and dinner  . ondansetron (ZOFRAN) 4 MG tablet Take 4 mg by mouth every 4 (four) hours as needed for nausea.   . polyethylene glycol (MIRALAX / GLYCOLAX) packet Take 17 g by mouth daily as needed for moderate constipation.  . predniSONE (DELTASONE) 1 MG tablet Take 1 mg by mouth daily with breakfast.   . rOPINIRole (REQUIP) 1 MG tablet 1mg  at noon, 1mg  2 hours before bed, then 1mg  at bedtime  . senna-docusate (SENOKOT-S) 8.6-50 MG tablet Take 1 tablet by mouth 2 (two) times daily.  Marland Kitchen SILENOR 6 MG TABS Take 0.5 tablets (3 mg total) by mouth at bedtime.  . traMADol (ULTRAM) 50 MG tablet Take 1 tablet (50 mg total) by mouth 2 (two) times daily.  Marland Kitchen warfarin (COUMADIN) 2 MG tablet Take 2 mg by mouth daily.   . [DISCONTINUED] oxybutynin (DITROPAN) 5 MG tablet Take 2.5 mg by mouth 2 (two) times daily. 0.5 tablet to = 2.5 mg        Review of systems.  This is limited secondary to dementia she does not complain of any pain shortness of breath-says she thinks she is doing okay today.  Nursing does not report any issues     Physical exam.  Temperature 96.7 pulse 60 respiration 17 blood pressure 130/57.  In general this a pleasant elderly female no distress lying comfortably in bed.  Her skin is warm and dry I do not note any  concerning new bruising or any evidence of bleeding.  Eyes sclera conjunctive are clear visual acuity appears to be intact.  Oropharynx clear mucous membranes moist.  Chest is clear to auscultation there is no labored breathing.  Heart is regular rate and rhythm with an occasional irregular beat she has mild baseline lower extremity edema.  Abdomen soft nontender with positive bowel sounds.  Musculoskeletal Limited exam she is in bed but appears able to move all extremities x4 with continued lower extremity weakness.  Neurologic appears grossly intact cannot read appreciate lateralizing findings her speech is clear.  Psych she is pleasant and appropriate oriented to self  only does follow simple verbal commands with some prompting.  Labs.  Mar 27, 2020.  INR 2.3. Mar 17, 2020.  INR 2.0.  Mar 13, 2020-.  INR 1.3.  Mar 11, 2020-INR 3.1.  Mar 10, 2020.  INR 3.4.   December 03, 2019.  WBC 8.9 hemoglobin 11.6 platelets 161.  Sodium 143 potassium 3.7 BUN 14.8 creatinine 0.83.  Recent Labs    11/23/19 0000 11/26/19 0000 12/03/19 0000  NA 138 136* 143  K 3.9 5.3 3.7  CL 106 103 109*  CO2 16 18 18   BUN 21 23* 15  CREATININE 0.9 1.0 0.8  CALCIUM 8.8 8.8 9.1     Recent Labs (within last 365 days)     Recent Labs    06/12/19 0000  AST 14  ALT 12  ALKPHOS 49     Recent Labs (within last 365 days)       Recent Labs    11/23/19 0000 11/26/19 0000 12/03/19 0000  WBC 18.2 14.7 8.9  NEUTROABS 18 12 7   HGB 11.1* 9.4* 11.6*  HCT 35* 29* 36  PLT 312 224 461*     Recent Labs       Lab Results  Component Value Date   TSH 1.71 08/01/2018     Recent Labs   Assessment and plan.  1.-History of DVT and PE on chronic Coumadin-INR continues to be therapeutic at 2.3-at this point continue 2 mg a day which she has been pretty stable on for the most part-and recheck this in 1 week.  Also will update a CBC and BMP next week for updated values to  make sure hemoglobin remains stable. As well as renal function  [id number]

## 2020-03-29 ENCOUNTER — Encounter: Payer: Self-pay | Admitting: Internal Medicine

## 2020-04-01 ENCOUNTER — Other Ambulatory Visit: Payer: Self-pay | Admitting: Internal Medicine

## 2020-04-01 MED ORDER — TRAMADOL HCL 50 MG PO TABS: 50.0000 mg | ORAL_TABLET | Freq: Two times a day (BID) | ORAL | 0 refills | Status: DC

## 2020-04-03 DIAGNOSIS — M2042 Other hammer toe(s) (acquired), left foot: Secondary | ICD-10-CM | POA: Diagnosis not present

## 2020-04-03 DIAGNOSIS — M2041 Other hammer toe(s) (acquired), right foot: Secondary | ICD-10-CM | POA: Diagnosis not present

## 2020-04-03 DIAGNOSIS — I739 Peripheral vascular disease, unspecified: Secondary | ICD-10-CM | POA: Diagnosis not present

## 2020-04-03 DIAGNOSIS — D649 Anemia, unspecified: Secondary | ICD-10-CM | POA: Diagnosis not present

## 2020-04-03 DIAGNOSIS — B351 Tinea unguium: Secondary | ICD-10-CM | POA: Diagnosis not present

## 2020-04-03 DIAGNOSIS — I10 Essential (primary) hypertension: Secondary | ICD-10-CM | POA: Diagnosis not present

## 2020-04-08 ENCOUNTER — Encounter: Payer: Self-pay | Admitting: Internal Medicine

## 2020-04-08 ENCOUNTER — Non-Acute Institutional Stay (SKILLED_NURSING_FACILITY): Payer: Medicare Other | Admitting: Internal Medicine

## 2020-04-08 DIAGNOSIS — F0280 Dementia in other diseases classified elsewhere without behavioral disturbance: Secondary | ICD-10-CM | POA: Insufficient documentation

## 2020-04-08 DIAGNOSIS — I10 Essential (primary) hypertension: Secondary | ICD-10-CM | POA: Diagnosis not present

## 2020-04-08 DIAGNOSIS — I2510 Atherosclerotic heart disease of native coronary artery without angina pectoris: Secondary | ICD-10-CM | POA: Diagnosis not present

## 2020-04-08 DIAGNOSIS — G301 Alzheimer's disease with late onset: Secondary | ICD-10-CM

## 2020-04-08 DIAGNOSIS — Z66 Do not resuscitate: Secondary | ICD-10-CM

## 2020-04-08 DIAGNOSIS — R0602 Shortness of breath: Secondary | ICD-10-CM | POA: Diagnosis not present

## 2020-04-08 NOTE — Progress Notes (Signed)
Location:  [facility] of Service:  SNF [phone]) Provider:  Keyle Doby L. Mariea Clonts, D.O., C.M.D.  [name], DO  Patient Care Team: [name], DO as PCP - General (Geriatric Medicine)  Extended Emergency Contact Information Primary Emergency Contact: Cormier,[name] Address: [place], [place] [address] [name] of [place] Phone: [phone] Mobile Phone: [phone] Relation: Son Secondary Emergency Contact: [place] of [place] Phone: [phone] Relation: Daughter  Code Status:  DNR Goals of care: Advanced Directive information Advanced Directives 03/17/2020  Does Patient Have a Medical Advance Directive? Yes  Type of Advance Directive Out of facility DNR (pink MOST or yellow form)  Does patient want to make changes to medical advance directive? No - Patient declined  Copy of [place] in Chart? -  Pre-existing out of facility DNR order (yellow form or pink MOST form) Yellow form placed in chart (order not valid for inpatient use)   Chief Complaint  Patient presents with  . Acute Visit    shortness of breath, left shoulder pain (has had the shoulder pain since a prior vaccine several months ago)    HPI:  Pt is a 86 y.o. female with h/o CAD with unstable angina, htn, VTE/PE, dysphagia with esophageal stricture, dementia with failure to thrive, hyperlipidemia, prior hip fx and L2 compression fx, CKD3, overactive bladder, polymyalgia rheumatica, restless leg syndrome seen today for an acute visit for increased shortness of breath and left shoulder pain.  Reports left shoulder has hurt since she got her covid vaccine, then says it was last month (but had it in dec and jan)--unclear.  She has not chest pain or abdominal pain.  Vitals are stable.  She also says her left left ribs are sore to touch when examined.  She does not appear in acute distress.  She wants the ADON to  call her son.    Past Medical History:  Diagnosis Date  . Anemia 03/02/2012  . Angina pectoris, unstable ([place]) 03/02/2012  . Anxiety   . Axillary abscess   . Bradycardia 06/13/2012  . CAD (coronary artery disease)    S/p PTCA / stenting (last cath 2004, multiple LAD stents, 2 stents in the right coronary artery all patent)  . Cancer ([place])   . Complication of anesthesia   . Compression fracture of L2 ([place]) 03/12/2017  . Dementia ([place])   . Dysphagia 10/06/2012  . Dyspnea 02/03/2012  . GERD (gastroesophageal reflux disease)   . History of pulmonary embolus (PE) 03/19/2017  . HTN (hypertension)   . PMR (polymyalgia rheumatica) (HCC)   . PONV (postoperative nausea and vomiting)   . Restless leg syndrome   . 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 Started coumadin  . Weakness of both legs 06/09/2012   Past Surgical History:  Procedure Laterality Date  . BREAST LUMPECTOMY    . COLECTOMY    . ESOPHAGOGASTRODUODENOSCOPY (EGD) WITH ESOPHAGEAL DILATION  10/10/2012   Procedure: ESOPHAGOGASTRODUODENOSCOPY (EGD) WITH ESOPHAGEAL DILATION;  Surgeon: Inda Castle, MD;  Location: [facility];  Service: Endoscopy;  Laterality: N/A;  . FEMUR IM NAIL Left 12/13/2018   Procedure: INTRAMEDULLARY (IM) NAIL FEMORAL LEFT;  Surgeon: Paralee Cancel, MD;  Location: [facility];  Service: Orthopedics;  Laterality: Left;  . heart stents  x 8  . INCISION AND DRAINAGE     bilateral axillary, non specific staff  .  INCISION AND DRAINAGE ABSCESS  09/28/2012   Procedure: INCISION AND DRAINAGE ABSCESS;  Surgeon: Zenovia Jarred, MD;  Location: [facility];  Service: General;  Laterality: Bilateral;  . stent     cardiac x 8 stents.    Allergies  Allergen Reactions  . Ambien [Zolpidem Tartrate] Nausea And Vomiting  . Codeine   . Codeine Phosphate Nausea And Vomiting  . Darvocet [Propoxyphene N-Acetaminophen] Nausea And Vomiting  . Fish Allergy   . Fish-Derived Products   . Hydrocodone   . Promethazine   .  Promethazine Hcl Nausea And Vomiting  . Sulfamethoxazole Other (See Comments)    Pt doesn't remember reaction  . Vicodin [Hydrocodone-Acetaminophen] Other (See Comments)    Unknown reaction  . Amoxicillin Nausea And Vomiting and Rash  . Penicillins Nausea And Vomiting and Rash    Outpatient Encounter Medications as of 04/08/2020  Medication Sig  . acetaminophen (TYLENOL) 325 MG tablet Take 650 mg by mouth every 6 (six) hours as needed for mild pain, moderate pain or fever.  Marland Kitchen acetaminophen (TYLENOL) 325 MG tablet Take 650 mg by mouth every 6 (six) hours. Take routinely for left hip pain  . albuterol (PROAIR HFA) 108 (90 Base) MCG/ACT inhaler Inhale 2 puffs into the lungs every 6 (six) hours as needed for wheezing or shortness of breath.  . ALPRAZolam (XANAX) 0.5 MG tablet Take 0.5 tablets (0.25 mg total) by mouth daily.  Marland Kitchen amLODipine (NORVASC) 10 MG tablet Take 10 mg by mouth every morning.   Marland Kitchen atorvastatin (LIPITOR) 10 MG tablet Take 10 mg by mouth every evening.  . calcium carbonate (TUMS) 500 MG chewable tablet Chew 1 tablet by mouth 3 (three) times daily as needed for indigestion or heartburn.  . Cholecalciferol (VITAMIN D3) 1.25 MG (50000 UT) CAPS Take 1 capsule by mouth once a week.  . donepezil (ARICEPT) 10 MG tablet Take 10 mg by mouth every evening.   Marland Kitchen losartan (COZAAR) 50 MG tablet Take 50 mg by mouth at bedtime.   . nitroGLYCERIN (NITROSTAT) 0.4 MG SL tablet Place 0.4 mg under the tongue every 5 (five) minutes as needed for chest pain.  . NON FORMULARY Diet Type:  Liberalized due to poor appetite to Mech soft  . Nutritional Supplements (NUTRITIONAL SUPPLEMENT PO) Take 1 each by mouth 2 (two) times a day. Magic Cup - take with lunch and dinner  . ondansetron (ZOFRAN) 4 MG tablet Take 4 mg by mouth every 4 (four) hours as needed for nausea.   . polyethylene glycol (MIRALAX / GLYCOLAX) packet Take 17 g by mouth daily as needed for moderate constipation.  . predniSONE (DELTASONE) 1 MG  tablet Take 1 mg by mouth daily with breakfast.   . rOPINIRole (REQUIP) 1 MG tablet 1mg  at noon, 1mg  2 hours before bed, then 1mg  at bedtime  . senna-docusate (SENOKOT-S) 8.6-50 MG tablet Take 1 tablet by mouth 2 (two) times daily.  Marland Kitchen SILENOR 6 MG TABS Take 0.5 tablets (3 mg total) by mouth at bedtime.  . traMADol (ULTRAM) 50 MG tablet Take 1 tablet (50 mg total) by mouth 2 (two) times daily.  Marland Kitchen warfarin (COUMADIN) 2 MG tablet Take 2 mg by mouth daily.    No facility-administered encounter medications on file as of 04/08/2020.    Review of Systems  Constitutional: Negative for chills and fever.  HENT: Negative for congestion and sore throat.   Eyes: Negative for blurred vision.  Respiratory: Positive for shortness of breath. Negative for cough, hemoptysis, sputum  production and wheezing.   Cardiovascular: Negative for chest pain, palpitations, orthopnea, leg swelling and PND.  Gastrointestinal: Negative for abdominal pain and heartburn.  Genitourinary: Positive for frequency and urgency. Negative for dysuria.       OAB, incontinence  Musculoskeletal: Positive for joint pain. Negative for falls.  Neurological: Positive for weakness. Negative for dizziness and loss of consciousness.  Endo/Heme/Allergies: Bruises/bleeds easily.  Psychiatric/Behavioral: Positive for memory loss. Negative for depression. The patient is not nervous/anxious and does not have insomnia.     Immunization History  Administered Date(s) Administered  . Influenza-Unspecified 08/01/2016, 08/11/2017, 07/02/2018, 08/02/2019  . Moderna SARS-COVID-2 Vaccination 11/01/2019, 11/29/2019  . PPD Test 03/18/2017  . Pneumococcal Conjugate-13 09/17/2017  . Pneumococcal Polysaccharide-23 04/17/2019   Pertinent  Health Maintenance Due  Topic Date Due  . INFLUENZA VACCINE  06/01/2020  . DEXA SCAN  Completed  . PNA vac Low Risk Adult  Completed   Fall Risk  03/14/2018  Falls in the past year? No   Functional Status Survey:     Vitals:   04/08/20 1523  BP: (!) 168/76  Pulse: 77  Resp: 18  Temp: 97.9 F (36.6 C)  SpO2: 94%   There is no height or weight on file to calculate BMI. Physical Exam Vitals reviewed.  Constitutional:      General: She is not in acute distress.    Appearance: She is not ill-appearing or toxic-appearing.  HENT:     Head: Normocephalic and atraumatic.  Cardiovascular:     Rate and Rhythm: Normal rate and regular rhythm.     Pulses: Normal pulses.     Heart sounds: Murmur present.  Pulmonary:     Effort: Pulmonary effort is normal. No respiratory distress.     Breath sounds: Normal breath sounds. No wheezing, rhonchi or rales.  Abdominal:     General: Bowel sounds are normal.     Palpations: Abdomen is soft.     Tenderness: There is no abdominal tenderness.  Musculoskeletal:        General: Tenderness present. Normal range of motion.     Comments: Of left shoulder and lower left ribs on palpation  Skin:    General: Skin is warm and dry.  Neurological:     Mental Status: She is alert.     Comments: Short-term memory loss/ inconsistent with time in history  Psychiatric:        Mood and Affect: Mood normal.     Labs reviewed: Recent Labs    11/23/19 0000 11/26/19 0000 12/03/19 0000  NA 138 136* 143  K 3.9 5.3 3.7  CL 106 103 109*  CO2 16 18 18   BUN 21 23* 15  CREATININE 0.9 1.0 0.8  CALCIUM 8.8 8.8 9.1   Recent Labs    06/12/19 0000  AST 14  ALT 12  ALKPHOS 49   Recent Labs    11/23/19 0000 11/26/19 0000 12/03/19 0000  WBC 18.2 14.7 8.9  NEUTROABS 18 12 7   HGB 11.1* 9.4* 11.6*  HCT 35* 29* 36  PLT 312 224 461*   Lab Results  Component Value Date   TSH 1.71 08/01/2018   Lab Results  Component Value Date   HGBA1C 5.7 08/01/2018   Lab Results  Component Value Date   CHOL 140 08/01/2018   HDL 46 08/01/2018   LDLCALC 80 08/01/2018   TRIG 82 08/01/2018   CHOLHDL 2.4 03/03/2012     Assessment/Plan 1. Shortness of breath -vitals do  not suggest  acute event and pt appears ok clinically -did give her a nitroglycerin for pain and put her on oxygen for comfort -try to avoid hospitalizing her considering her stability, advanced age, underlying dementia--would not be a good candidate for cath or intervention outside of medication mgt  -if bp remains high over the course of days to weeks, may need to increase her losartan  2. Coronary artery disease involving native coronary artery of native heart without angina pectoris -as in #1, cont ARB, prn ntg for chest pain and O2 if dyspneic  3. HYPERTENSION, BENIGN -bp high today, but not severely so--pt was a bit upset at time -cont norvasc 10mg , losartan 50mg  but increase if bp remains elevated   4. Late onset Alzheimer's disease without behavioral disturbance (HCC) -cont aricept and monitor for bradycardia  5. DNR (do not resuscitate) -order entered into epic system as it is at [facility] not attempt resuscitation (DNR)  Family/ staff Communication: discussed with ADON who was calling pt's son  Labs/tests ordered:  No new, monitor  Jaris Kohles L. Jaeleigh Monaco, D.O. [facility] [address], [place] [address] Cell Phone (Mon-Fri 8am-5pm):  [phone] On Call:  [phone] & follow prompts after 5pm & weekends Office Phone:  [phone] Office Fax:  [phone]

## 2020-04-09 DIAGNOSIS — F5101 Primary insomnia: Secondary | ICD-10-CM | POA: Diagnosis not present

## 2020-04-09 DIAGNOSIS — F411 Generalized anxiety disorder: Secondary | ICD-10-CM | POA: Diagnosis not present

## 2020-04-09 DIAGNOSIS — F0390 Unspecified dementia without behavioral disturbance: Secondary | ICD-10-CM | POA: Diagnosis not present

## 2020-04-10 ENCOUNTER — Encounter: Payer: Self-pay | Admitting: Internal Medicine

## 2020-04-10 ENCOUNTER — Non-Acute Institutional Stay (SKILLED_NURSING_FACILITY): Payer: Medicare Other | Admitting: Internal Medicine

## 2020-04-10 DIAGNOSIS — I8290 Acute embolism and thrombosis of unspecified vein: Secondary | ICD-10-CM | POA: Diagnosis not present

## 2020-04-10 DIAGNOSIS — Z7901 Long term (current) use of anticoagulants: Secondary | ICD-10-CM

## 2020-04-10 DIAGNOSIS — R0602 Shortness of breath: Secondary | ICD-10-CM

## 2020-04-10 DIAGNOSIS — Z86711 Personal history of pulmonary embolism: Secondary | ICD-10-CM

## 2020-04-10 DIAGNOSIS — I10 Essential (primary) hypertension: Secondary | ICD-10-CM

## 2020-04-10 DIAGNOSIS — D649 Anemia, unspecified: Secondary | ICD-10-CM | POA: Diagnosis not present

## 2020-04-10 LAB — CBC AND DIFFERENTIAL
HCT: 31 — AB (ref 36–46)
Hemoglobin: 9.7 — AB (ref 12.0–16.0)
Platelets: 315 (ref 150–399)
WBC: 9

## 2020-04-10 LAB — CBC: RBC: 3.72 — AB (ref 3.87–5.11)

## 2020-04-10 NOTE — Progress Notes (Signed)
Location:  [facility] Room Number: 204-D Place of Service:  SNF [phone]) Provider: Granville Lewis, P.A.  PCP: [name], DO  Patient Care Team: [name], DO as PCP - General (Geriatric Medicine) Rehab, [place] ([place])  Extended Emergency Contact Information Primary Emergency Contact: Tersigni,[name] Address: [place], [place] [address] [name] of [place] Phone: [phone] Mobile Phone: [phone] Relation: Son Secondary Emergency Contact: [place] of [place] Phone: [phone] Relation: Daughter  Code Status:  DNR  Goals of care: Advanced Directive information Advanced Directives 03/17/2020  Does Patient Have a Medical Advance Directive? Yes  Type of Advance Directive Out of facility DNR (pink MOST or yellow form)  Does patient want to make changes to medical advance directive? No - Patient declined  Copy of [place] in Chart? -  Pre-existing out of facility DNR order (yellow form or pink MOST form) Yellow form placed in chart (order not valid for inpatient use)     Chief Complaint  Patient presents with  . Anticoagulation    Antiocoagulation management   On chronic Coumadin with a history of DVT-PE--also follow-up of hypertension and shortness of breath  HPI:  Pt is a 86 y.o. female seen today for an acute visit for anticoagulation management with a history of DVT and pulmonary embolism in the past.  Also follow-up of hypertension as well as complaints of shortness of breath earlier this week.  Patient is a long-term resident of the facility she has a history of DVT and pulmonary embolism and is on chronic Coumadin currently 2 mg a day.  INR is therapeutic today at 2.7 it was 2.2 last week.  There has been no evidence of increased bruising or bleeding.  She appears to be at her baseline today resting in bed  comfortably-her only concern was she wanted me to hand her her coloring book.  Earlier this week she was seen by Dr. Mariea Clonts for complaints of shortness of breath-this appears to be transitory and she did receive a dose of nitro as well as oxygen for comfort.  She is no longer wearing the oxygen and appears quite comfortable.  Blood pressure also at that time was noted to be elevated with a systolic in the [id number] is thought to be more situational may be anxiety related.  Blood pressure today is 121/76 it appears baseline blood pressures are more in the 1 teens to the 130s-with occasional spikes in the 150 area but this is not frequent.  She does continue on Cozaar 50 mg a day in addition to Norvasc 10 mg a day.     Past Medical History:  Diagnosis Date  . Anemia 03/02/2012  . Angina pectoris, unstable ([facility]) 03/02/2012  . Anxiety   . Axillary abscess   . Bradycardia 06/13/2012  . CAD (coronary artery disease)    S/p PTCA / stenting (last cath 2004, multiple LAD stents, 2 stents in the right coronary artery all patent)  . Cancer ([facility])   . Complication of anesthesia   . Compression fracture of L2 ([place]) 03/12/2017  . Dementia ([facility])   . Dysphagia 10/06/2012  . Dyspnea 02/03/2012  . GERD (gastroesophageal reflux disease)   . History of pulmonary embolus (PE) 03/19/2017  . HTN (hypertension)   . PMR (polymyalgia rheumatica) (HCC)   . PONV (postoperative nausea and vomiting)   .  Restless leg syndrome   . 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 Started coumadin  . Weakness of both legs 06/09/2012   Past Surgical History:  Procedure Laterality Date  . BREAST LUMPECTOMY    . COLECTOMY    . ESOPHAGOGASTRODUODENOSCOPY (EGD) WITH ESOPHAGEAL DILATION  10/10/2012   Procedure: ESOPHAGOGASTRODUODENOSCOPY (EGD) WITH ESOPHAGEAL DILATION;  Surgeon: Inda Castle, MD;  Location: [facility];  Service: Endoscopy;  Laterality: N/A;  . FEMUR IM NAIL Left 12/13/2018   Procedure: INTRAMEDULLARY  (IM) NAIL FEMORAL LEFT;  Surgeon: Paralee Cancel, MD;  [facility];  Service: Orthopedics;  Laterality: Left;  . heart stents  x 8  . INCISION AND DRAINAGE     bilateral axillary, non specific staff  . INCISION AND DRAINAGE ABSCESS  09/28/2012   Procedure: INCISION AND DRAINAGE ABSCESS;  Surgeon: Zenovia Jarred, MD;  Location: [facility];  Service: General;  Laterality: Bilateral;  . stent     cardiac x 8 stents.    Allergies  Allergen Reactions  . Ambien [Zolpidem Tartrate] Nausea And Vomiting  . Codeine   . Codeine Phosphate Nausea And Vomiting  . Darvocet [Propoxyphene N-Acetaminophen] Nausea And Vomiting  . Fish Allergy   . Fish-Derived Products   . Hydrocodone   . Promethazine   . Promethazine Hcl Nausea And Vomiting  . Sulfamethoxazole Other (See Comments)    Pt doesn't remember reaction  . Vicodin [Hydrocodone-Acetaminophen] Other (See Comments)    Unknown reaction  . Amoxicillin Nausea And Vomiting and Rash  . Penicillins Nausea And Vomiting and Rash    Outpatient Encounter Medications as of 04/10/2020  Medication Sig  . acetaminophen (TYLENOL) 325 MG tablet Take 650 mg by mouth every 6 (six) hours as needed for mild pain, moderate pain or fever.  Marland Kitchen acetaminophen (TYLENOL) 325 MG tablet Take 650 mg by mouth every 6 (six) hours. Take routinely for left hip pain  . albuterol (PROAIR HFA) 108 (90 Base) MCG/ACT inhaler Inhale 2 puffs into the lungs every 6 (six) hours as needed for wheezing or shortness of breath.  . ALPRAZolam (XANAX) 0.5 MG tablet Take 0.5 tablets (0.25 mg total) by mouth daily.  Marland Kitchen amLODipine (NORVASC) 10 MG tablet Take 10 mg by mouth every morning.   Marland Kitchen atorvastatin (LIPITOR) 10 MG tablet Take 10 mg by mouth every evening.  . calcium carbonate (TUMS) 500 MG chewable tablet Chew 1 tablet by mouth 3 (three) times daily as needed for indigestion or heartburn.  . Cholecalciferol (VITAMIN D3) 1.25 MG (50000 UT) CAPS Take 1 capsule by mouth once a week.  .  donepezil (ARICEPT) 10 MG tablet Take 10 mg by mouth every evening.   Marland Kitchen losartan (COZAAR) 50 MG tablet Take 50 mg by mouth at bedtime.   . nitroGLYCERIN (NITROSTAT) 0.4 MG SL tablet Place 0.4 mg under the tongue every 5 (five) minutes as needed for chest pain.  . NON FORMULARY Diet Type:  Liberalized due to poor appetite to Mech soft  . Nutritional Supplements (NUTRITIONAL SUPPLEMENT PO) Take 1 each by mouth 2 (two) times a day. Magic Cup - take with lunch and dinner  . ondansetron (ZOFRAN) 4 MG tablet Take 4 mg by mouth every 4 (four) hours as needed for nausea.   . polyethylene glycol (MIRALAX / GLYCOLAX) packet Take 17 g by mouth daily as needed for moderate constipation.  . predniSONE (DELTASONE) 1 MG tablet Take 1 mg by mouth daily with breakfast.   . rOPINIRole (REQUIP) 1  MG tablet 1mg  at noon, 1mg  2 hours before bed, then 1mg  at bedtime  . senna-docusate (SENOKOT-S) 8.6-50 MG tablet Take 1 tablet by mouth 2 (two) times daily.  Marland Kitchen SILENOR 6 MG TABS Take 0.5 tablets (3 mg total) by mouth at bedtime.  . traMADol (ULTRAM) 50 MG tablet Take 1 tablet (50 mg total) by mouth 2 (two) times daily.  Marland Kitchen warfarin (COUMADIN) 2 MG tablet Take 2 mg by mouth daily.    No facility-administered encounter medications on file as of 04/10/2020.    Review of Systems This is limited secondary to dementia but she is not complaining of any shortness of breath today or chest pain.  Does not complain of headache dizziness syncope.  Her only concern is she wants to get her coloring book so she can color.   Immunization History  Administered Date(s) Administered  . Influenza-Unspecified 08/01/2016, 08/11/2017, 07/02/2018, 08/02/2019  . Moderna SARS-COVID-2 Vaccination 11/01/2019, 11/29/2019  . PPD Test 03/18/2017  . Pneumococcal Conjugate-13 09/17/2017  . Pneumococcal Polysaccharide-23 04/17/2019   Pertinent  Health Maintenance Due  Topic Date Due  . INFLUENZA VACCINE  06/01/2020  . DEXA SCAN  Completed    . PNA vac Low Risk Adult  Completed   Fall Risk  03/14/2018  Falls in the past year? No   Functional Status Survey:    Temperature is 97.0 pulse 77 respirations 19 blood pressure 121/76.    Physical Exam   In general this is a pleasant elderly female who appears to be at her baseline she is lying in bed comfortably.  Her skin is warm and dry do not note any increased bruising from baseline.  Eyes visual acuity is intact sclera and conjunctive are clear.  Oropharynx is clear mucous membranes moist.  Chest is clear to auscultation with somewhat shallow air entry there is no labored breathing.  Heart is regular rate and rhythm with a baseline systolic murmur she has mild baseline lower extremity edema bilaterally.  Abdomen is soft nontender with positive bowel sounds.  Musculoskeletal moves upper extremities at baseline continues with lower extremity weakness at baseline.  Neurologic appears grossly intact her speech is clear she is alert do not appreciate any true lateralizing findings.  Psych she is oriented to self she is pleasant appropriate appears to be in good spirits-appears to be her self today.    Labs reviewed:  04/10/2020.  INR 2.7.  April 03, 2020.  INR 2.2.   April 10, 2020.  WBC 9.0 hemoglobin 9.7 platelets 315.    April 03, 2020.  WBC 7.7 hemoglobin 9.5 platelets 296.  Sodium 136 potassium 4.7 BUN 26 creatinine 0.78.   Recent Labs    11/23/19 0000 11/26/19 0000 12/03/19 0000  NA 138 136* 143  K 3.9 5.3 3.7  CL 106 103 109*  CO2 16 18 18   BUN 21 23* 15  CREATININE 0.9 1.0 0.8  CALCIUM 8.8 8.8 9.1   Recent Labs    06/12/19 0000  AST 14  ALT 12  ALKPHOS 49   Recent Labs    11/23/19 0000 11/26/19 0000 12/03/19 0000  WBC 18.2 14.7 8.9  NEUTROABS 18 12 7   HGB 11.1* 9.4* 11.6*  HCT 35* 29* 36  PLT 312 224 461*   Lab Results  Component Value Date   TSH 1.71 08/01/2018   Lab Results  Component Value Date   HGBA1C 5.7  08/01/2018   Lab Results  Component Value Date   CHOL 140 08/01/2018  HDL 46 08/01/2018   LDLCALC 80 08/01/2018   TRIG 82 08/01/2018   CHOLHDL 2.4 03/03/2012    Significant Diagnostic Results in last 30 days:  No results found.  Assessment/Plan  #1 history of anticoagulation management with history of DVT PE in the past-INR is therapeutic at 2.7-at this point continue Coumadin 2 mg a day and we will recheck this next week.  2.-History of shortness of breath this appears to have been transitory possibly anxiety related-she did receive a dose of nitro as well as oxygen-she appears quite comfortable today and at baseline no complaints of shortness of breath or chest pain will monitor.  3.  Hypertension again this was thought possibly to be transitory during her issues earlier this week-blood pressure appears stable and is 121/76 today-I do not see consistent elevations although occasionally she will have a systolic in the 563S recently-but this does not appear to be consistent.  She continues on Norvasc 10 mg a day and Cozaar 50 mg a day.   #4 anemia-this appears stable updated hemoglobin was 9.7 today which appears to be consistent with previous levels at this point will monitor this appears stable appears recent hemoglobins have been more in the 9-11 range  [phone]

## 2020-04-11 ENCOUNTER — Encounter: Payer: Self-pay | Admitting: Internal Medicine

## 2020-04-14 ENCOUNTER — Encounter: Payer: Self-pay | Admitting: Internal Medicine

## 2020-04-14 ENCOUNTER — Non-Acute Institutional Stay (SKILLED_NURSING_FACILITY): Payer: Medicare Other | Admitting: Internal Medicine

## 2020-04-14 DIAGNOSIS — I8290 Acute embolism and thrombosis of unspecified vein: Secondary | ICD-10-CM | POA: Diagnosis not present

## 2020-04-14 DIAGNOSIS — R791 Abnormal coagulation profile: Secondary | ICD-10-CM

## 2020-04-14 DIAGNOSIS — Z86711 Personal history of pulmonary embolism: Secondary | ICD-10-CM

## 2020-04-14 NOTE — Progress Notes (Signed)
Location:    [place] Room Number: 204/D Place of Service:  SNF [phone]) Provider:  Leda Roys, DO  Patient Care Team: [name], DO as PCP - General (Geriatric Medicine) Rehab, [facility] ([place])  Extended Emergency Contact Information Primary Emergency Contact: Schindel,[name] Address: [place], [place] [address] [name] of [place] Phone: [phone] Mobile Phone: [phone] Relation: Son Secondary Emergency Contact: [place] of [place] Phone: [phone] Relation: Daughter  Code Status:  DNR Goals of care: Advanced Directive information Advanced Directives 04/14/2020  Does Patient Have a Medical Advance Directive? Yes  Type of Paramedic of [place];Living will;Out of facility DNR (pink MOST or yellow form)  Does patient want to make changes to medical advance directive? No - Patient declined  Copy of [place] in Chart? Yes - validated most recent copy scanned in chart (See row information)  Pre-existing out of facility DNR order (yellow form or pink MOST form) Yellow form placed in chart (order not valid for inpatient use)     Chief Complaint  Patient presents with  . Anticoagulation    Anticoagulation Management    HPI:  Pt is a 86 y.o. female seen today for an acute visit for anticoagulation management-she is on chronic Coumadin with a history of pulmonary embolism as well as DVT.  She has been fairly stable on 2 mg a day which is her current dose however she is slightly supratherapeutic today at 3.3.  Her last INR was 2.7 last week-it was 2.2 the previous week.  There is no evidence of increased bruising or bleeding.  She appears to be at her baseline today resting in bed comfortably does not have any acute complaints       Review of Systems \ This is somewhat  limited secondary to dementia.  In general she not complain of any fever or chills.  Skin does not complain of rashes itching or diaphoresis.  Or increasing bruising  Head ears eyes nose mouth and throat is not complaining of visual changes or sore throat.  Respiratory no complaints of shortness of breath or cough today.  Cardiac does not complain of chest pain has baseline mild lower extremity edema.  GI she is not complaining of abdominal pain or nausea or vomiting.  GU no complaints of dysuria.  Musculoskeletal has lower extremity weakness at baseline is not really complaining of any joint pain  Neurologic as noted above is not complain of dizziness headache or numbness.  Psych does not complain of being depressed or anxious at times appears to have some anxiety but that is not the case today.  .      Past Medical History:  Diagnosis Date  . Anemia 03/02/2012  . Angina pectoris, unstable ([place]) 03/02/2012  . Anxiety   . Axillary abscess   . Bradycardia 06/13/2012  . CAD (coronary artery disease)    S/p PTCA / stenting (last cath 2004, multiple LAD stents, 2 stents in the right coronary artery all patent)  . Cancer ([place])   . Complication of anesthesia   . Compression fracture of L2 ([place]) 03/12/2017  . Dementia ([place])   . Dysphagia 10/06/2012  . Dyspnea 02/03/2012  . GERD (gastroesophageal reflux disease)   . History of pulmonary embolus (PE) 03/19/2017  . HTN (hypertension)   . PMR (  polymyalgia rheumatica) (HCC)   . PONV (postoperative nausea and vomiting)   . Restless leg syndrome   . 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 Started coumadin  . Weakness of both legs 06/09/2012        Past Surgical History:  Procedure Laterality Date  . BREAST LUMPECTOMY    . COLECTOMY    . ESOPHAGOGASTRODUODENOSCOPY (EGD) WITH ESOPHAGEAL DILATION  10/10/2012   Procedure: ESOPHAGOGASTRODUODENOSCOPY (EGD) WITH ESOPHAGEAL DILATION;  Surgeon: Inda Castle,  MD;  Location: [facility];  Service: Endoscopy;  Laterality: N/A;  . FEMUR IM NAIL Left 12/13/2018   Procedure: INTRAMEDULLARY (IM) NAIL FEMORAL LEFT;  Surgeon: Paralee Cancel, MD;  Location: [facility];  Service: Orthopedics;  Laterality: Left;  . heart stents  x 8  . INCISION AND DRAINAGE     bilateral axillary, non specific staff  . INCISION AND DRAINAGE ABSCESS  09/28/2012   Procedure: INCISION AND DRAINAGE ABSCESS;  Surgeon: Zenovia Jarred, MD;  Location: [facility];  Service: General;  Laterality: Bilateral;  . stent     cardiac x 8 stents.         Allergies  Allergen Reactions  . Ambien [Zolpidem Tartrate] Nausea And Vomiting  . Codeine   . Codeine Phosphate Nausea And Vomiting  . Darvocet [Propoxyphene N-Acetaminophen] Nausea And Vomiting  . Fish Allergy   . Fish-Derived Products   . Hydrocodone   . Promethazine   . Promethazine Hcl Nausea And Vomiting  . Sulfamethoxazole Other (See Comments)    Pt doesn't remember reaction  . Vicodin [Hydrocodone-Acetaminophen] Other (See Comments)    Unknown reaction  . Amoxicillin Nausea And Vomiting and Rash  . Penicillins Nausea And Vomiting and Rash        MEDICATIONS    Medication Sig  . acetaminophen (TYLENOL) 325 MG tablet Take 650 mg by mouth every 6 (six) hours as needed for mild pain, moderate pain or fever.  Marland Kitchen acetaminophen (TYLENOL) 325 MG tablet Take 650 mg by mouth every 6 (six) hours. Take routinely for left hip pain  . albuterol (PROAIR HFA) 108 (90 Base) MCG/ACT inhaler Inhale 2 puffs into the lungs every 6 (six) hours as needed for wheezing or shortness of breath.  . ALPRAZolam (XANAX) 0.5 MG tablet Take 0.5 tablets (0.25 mg total) by mouth daily.  Marland Kitchen amLODipine (NORVASC) 10 MG tablet Take 10 mg by mouth every morning.   Marland Kitchen atorvastatin (LIPITOR) 10 MG tablet Take 10 mg by mouth every evening.  . calcium carbonate (TUMS) 500 MG chewable tablet Chew 1 tablet by mouth 3 (three) times daily as needed  for indigestion or heartburn.  . Cholecalciferol (VITAMIN D3) 1.25 MG (50000 UT) CAPS Take 1 capsule by mouth once a week.  . donepezil (ARICEPT) 10 MG tablet Take 10 mg by mouth every evening.   Marland Kitchen losartan (COZAAR) 50 MG tablet Take 50 mg by mouth at bedtime.   . nitroGLYCERIN (NITROSTAT) 0.4 MG SL tablet Place 0.4 mg under the tongue every 5 (five) minutes as needed for chest pain.  . NON FORMULARY Diet Type:  Liberalized due to poor appetite to Mech soft  . Nutritional Supplements (NUTRITIONAL SUPPLEMENT PO) Take 1 each by mouth 2 (two) times a day. Magic Cup - take with lunch and dinner  . ondansetron (ZOFRAN) 4 MG tablet Take 4 mg by mouth every 4 (four) hours as needed for nausea.   . polyethylene glycol (MIRALAX / GLYCOLAX) packet Take 17 g by mouth daily as  needed for moderate constipation.  . predniSONE (DELTASONE) 1 MG tablet Take 1 mg by mouth daily with breakfast.   . rOPINIRole (REQUIP) 1 MG tablet 1mg  at noon, 1mg  2 hours before bed, then 1mg  at bedtime  . senna-docusate (SENOKOT-S) 8.6-50 MG tablet Take 1 tablet by mouth 2 (two) times daily.  Marland Kitchen SILENOR 6 MG TABS Take 0.5 tablets (3 mg total) by mouth at bedtime.  . traMADol (ULTRAM) 50 MG tablet Take 1 tablet (50 mg total) by mouth 2 (two) times daily.  Marland Kitchen warfarin (COUMADIN) 2 MG tablet Take 2 mg by mouth daily.    No facility-administered encounter medications on file as of 04/10/2020.            Immunization History  Administered Date(s) Administered  . Influenza-Unspecified 08/01/2016, 08/11/2017, 07/02/2018, 08/02/2019  . Moderna SARS-COVID-2 Vaccination 11/01/2019, 11/29/2019  . PPD Test 03/18/2017  . Pneumococcal Conjugate-13 09/17/2017  . Pneumococcal Polysaccharide-23 04/17/2019   Pertinent  Health Maintenance Due  Topic Date Due  . INFLUENZA VACCINE  06/01/2020  . DEXA SCAN  Completed  . PNA vac Low Risk Adult  Completed   Fall Risk  03/14/2018  Falls in the past year? No   Functional Status  Survey:    Vitals:   04/14/20 1419  BP: 133/67  Pulse: (!) 54  Resp: 18  Temp: 97.8 F (36.6 C)  TempSrc: Oral  SpO2: 98%  Weight: 137 lb 3.2 oz (62.2 kg)  Height: 5\' 1"  (1.549 m)   Body mass index is 25.92 kg/m. Physical Exam  General this is a well-nourished elderly female no distress lying comfortably in bed.  Her skin is warm and dry do not see any evidence of increasing bruising beyond baseline or any bleeding.  Eyes sclera and conjunctive are clear visual acuity appears to be intact.  Oropharynx clear mucous membranes moist.  Chest is clear to auscultation there is no labored breathing.  Air entry continues to be somewhat shallow but that is not new.  Heart is regular rhythm slightly bradycardic she has a baseline systolic murmur and mild baseline lower extremity edema.  Abdomen soft nontender with positive bowel sounds.  Musculoskeletal baseline exam moving her upper extremities at baseline with continued lower extremity weakness.  Neurologic she is alert cannot appreciate lateralizing findings.  Psych she is pleasant appropriate oriented to self.    Labs reviewed:  April 14, 2020.  INR 3.3.  April 10, 2020-INR 2.7.   April 10, 2020.  WBC 9.0 hemoglobin 9.7 platelets 315.  April 03, 2020.  WBC 7.7 hemoglobin 9.5 platelets 296.  Sodium 136 potassium 4.7 BUN 26 creatinine 0.78.     Recent Labs    11/23/19 0000 11/26/19 0000 12/03/19 0000  NA 138 136* 143  K 3.9 5.3 3.7  CL 106 103 109*  CO2 16 18 18   BUN 21 23* 15  CREATININE 0.9 1.0 0.8  CALCIUM 8.8 8.8 9.1   Recent Labs    06/12/19 0000  AST 14  ALT 12  ALKPHOS 49   Recent Labs    11/23/19 0000 11/26/19 0000 12/03/19 0000  WBC 18.2 14.7 8.9  NEUTROABS 18 12 7   HGB 11.1* 9.4* 11.6*  HCT 35* 29* 36  PLT 312 224 461*   Lab Results  Component Value Date   TSH 1.71 08/01/2018   Lab Results  Component Value Date   HGBA1C 5.7 08/01/2018   Lab Results  Component Value  Date   CHOL 140 08/01/2018  HDL 46 08/01/2018   LDLCALC 80 08/01/2018   TRIG 82 08/01/2018   CHOLHDL 2.4 03/03/2012    Significant Diagnostic Results in last 30 days:  No results found.  Assessment/Plan   #1 anticoagulation management with history of DVT and PE-on chronic Coumadin-INR has gone up to 3.3-she does not show evidence of any increased bruising or bleeding-we will hold her Coumadin tonight and have this rechecked tomorrow.  Clinically she appears to be stable-at times she does have bradycardia but this is somewhat chronic and well-tolerated we will continue to monitor-does not complain of any chest pain or shortness of breath today  [id number].

## 2020-04-15 ENCOUNTER — Encounter: Payer: Self-pay | Admitting: Internal Medicine

## 2020-04-24 ENCOUNTER — Non-Acute Institutional Stay (SKILLED_NURSING_FACILITY): Payer: Medicare Other | Admitting: Internal Medicine

## 2020-04-24 DIAGNOSIS — Z7901 Long term (current) use of anticoagulants: Secondary | ICD-10-CM | POA: Diagnosis not present

## 2020-04-24 DIAGNOSIS — R0602 Shortness of breath: Secondary | ICD-10-CM | POA: Diagnosis not present

## 2020-04-24 DIAGNOSIS — I8290 Acute embolism and thrombosis of unspecified vein: Secondary | ICD-10-CM

## 2020-04-24 DIAGNOSIS — I10 Essential (primary) hypertension: Secondary | ICD-10-CM

## 2020-04-24 NOTE — Progress Notes (Signed)
This is an acute visit.  Level care skilled.  [facility]  Chief complaint acute visit.  Secondary to shortness of breath.  .  History of present illness.  Patient is a pleasant 86 year old female who apparently complained to nursing staff that she was feeling short of breath.  She denied any chest pain or other associated symptoms her vital signs were stable oxygen saturation actually was 100%.  Per chart review it appears that this is not a totally new situation-Dr. Mariea Clonts did see her recently it appears for similar episode.  Actually at that time she was also complaining of some left arm pain but she denies that today.  At that time she did receive nitro and also oxygen to help with her complaints of pain as well as shortness of breath   It was thought there was an anxiety component to this as well.  Today she also appears a little bit anxious stating that she needs to go to the hospital to see her son-it appears somehow she thinks her son is in the hospital.  I did try to reassure her and  appeared to be calm her and nursing  did apply oxygen for comfort.  Vital signs remained stable and when I checked on her later she says she was feeling better.  She continued to deny any chest pain physical exam remained essentially unchanged throughout the process-.  Out of caution we did order chest x-ray which has not shown any acute process.  She  does have a history of coronary artery disease as well as a previous history of DVT and pulmonary embolism-she is on chronic Coumadin and INR was 2.6 yesterday which is therapeutic. Regarding coronary artery disease she does have orders for as needed nitro  She also apparently has at times some wheezing and does have albuterol  as needed   Past Medical History:  Diagnosis Date   Anemia 03/02/2012   Angina pectoris, unstable ([place]) 03/02/2012   Anxiety    Axillary abscess    Bradycardia 06/13/2012   CAD (coronary  artery disease)    S/p PTCA / stenting (last cath 2004, multiple LAD stents, 2 stents in the right coronary artery all patent)   Cancer ([place])    Complication of anesthesia    Compression fracture of L2 ([place]) 03/12/2017   Dementia ([place])    Dysphagia 10/06/2012   Dyspnea 02/03/2012   GERD (gastroesophageal reflux disease)    History of pulmonary embolus (PE) 03/19/2017   HTN (hypertension)    PMR (polymyalgia rheumatica) (HCC)    PONV (postoperative nausea and vomiting)    Restless leg syndrome    VTE (venous thromboembolism) 10/2010   DVT and PE. Started coumadin   Weakness of both legs 06/09/2012        Past Surgical History:  Procedure Laterality Date   BREAST LUMPECTOMY     COLECTOMY     ESOPHAGOGASTRODUODENOSCOPY (EGD) WITH ESOPHAGEAL DILATION  10/10/2012   Procedure: ESOPHAGOGASTRODUODENOSCOPY (EGD) WITH ESOPHAGEAL DILATION; Surgeon: Inda Castle, MD; Location: [facility]; Service: Endoscopy; Laterality: N/A;   FEMUR IM NAIL Left 12/13/2018   Procedure: INTRAMEDULLARY (IM) NAIL FEMORAL LEFT; Surgeon: Paralee Cancel, MD; [facility]; Service: Orthopedics; Laterality: Left;   heart stents  x 8   INCISION AND DRAINAGE     bilateral axillary, non specific staff   INCISION AND DRAINAGE ABSCESS  09/28/2012   Procedure: INCISION AND DRAINAGE ABSCESS; Surgeon: Zenovia Jarred, MD; Location: [facility]; Service: General;  Laterality: Bilateral;   stent     cardiac x 8 stents.         Allergies  Allergen Reactions   Ambien [Zolpidem Tartrate] Nausea And Vomiting   Codeine    Codeine Phosphate Nausea And Vomiting   Darvocet [Propoxyphene N-Acetaminophen] Nausea And Vomiting   Fish Allergy    Fish-Derived Products    Hydrocodone    Promethazine    Promethazine Hcl Nausea And Vomiting   Sulfamethoxazole Other (See Comments)    Pt doesn't remember reaction   Vicodin  [Hydrocodone-Acetaminophen] Other (See Comments)    Unknown reaction   Amoxicillin Nausea And Vomiting and Rash   Penicillins Nausea And Vomiting and Rash        MEDICATIONS    Medication Sig   acetaminophen (TYLENOL) 325 MG tablet Take 650 mg by mouth every 6 (six) hours as needed for mild pain, moderate pain or fever.   acetaminophen (TYLENOL) 325 MG tablet Take 650 mg by mouth every 6 (six) hours. Take routinely for left hip pain   albuterol (PROAIR HFA) 108 (90 Base) MCG/ACT inhaler Inhale 2 puffs into the lungs every 6 (six) hours as needed for wheezing or shortness of breath.   ALPRAZolam (XANAX) 0.5 MG tablet Take 0.5 tablets (0.25 mg total) by mouth daily.   amLODipine (NORVASC) 10 MG tablet Take 10 mg by mouth every morning.    atorvastatin (LIPITOR) 10 MG tablet Take 10 mg by mouth every evening.   calcium carbonate (TUMS) 500 MG chewable tablet Chew 1 tablet by mouth 3 (three) times daily as needed for indigestion or heartburn.   Cholecalciferol (VITAMIN D3) 1.25 MG (50000 UT) CAPS Take 1 capsule by mouth once a week.   donepezil (ARICEPT) 10 MG tablet Take 10 mg by mouth every evening.    losartan (COZAAR) 50 MG tablet Take 50 mg by mouth at bedtime.    nitroGLYCERIN (NITROSTAT) 0.4 MG SL tablet Place 0.4 mg under the tongue every 5 (five) minutes as needed for chest pain.   NON FORMULARY Diet Type: Liberalized due to poor appetite to Mech soft   Nutritional Supplements (NUTRITIONAL SUPPLEMENT PO) Take 1 each by mouth 2 (two) times a day. Magic Cup - take with lunch and dinner   ondansetron (ZOFRAN) 4 MG tablet Take 4 mg by mouth every 4 (four) hours as needed for nausea.    polyethylene glycol (MIRALAX / GLYCOLAX) packet Take 17 g by mouth daily as needed for moderate constipation.   predniSONE (DELTASONE) 1 MG tablet Take 1 mg by mouth daily with breakfast.    rOPINIRole (REQUIP) 1 MG tablet 1mg  at noon, 1mg  2 hours before bed, then 1mg  at bedtime    senna-docusate (SENOKOT-S) 8.6-50 MG tablet Take 1 tablet by mouth 2 (two) times daily.   SILENOR 6 MG TABS Take 0.5 tablets (3 mg total) by mouth at bedtime.   traMADol (ULTRAM) 50 MG tablet Take 1 tablet (50 mg total) by mouth 2 (two) times daily.   warfarin (COUMADIN) 2 MG tablet Take 2 mg by mouth daily.    No facility-administered encounter medications on file as of 04/10/2020.                Immunization History  Administered Date(s) Administered   Influenza-Unspecified 08/01/2016, 08/11/2017, 07/02/2018, 08/02/2019   Moderna SARS-COVID-2 Vaccination 11/01/2019, 11/29/2019   PPD Test 03/18/2017   Pneumococcal Conjugate-13 09/17/2017   Pneumococcal Polysaccharide-23 04/17/2019       Pertinent  Health Maintenance Due  Topic Date Due   INFLUENZA VACCINE  06/01/2020   DEXA SCAN  Completed   PNA vac Low Risk Adult  Completed   Fall Risk  03/14/2018  Falls in the past year? No   Functional Status Survey:   Review of systems.  This is limited secondary to dementia please see above.  Currently is not complaining of shortness of breath or any chest pain appears to be back at her baseline.  Physical exam.  Temperature is 97.8 pulse is 84 respirations 22 blood pressure 150/73 oxygen saturation is 100%.  In general this is a pleasant elderly female initially appeared somewhat anxious but on reevaluation appeared to be less anxious.  No sign of acute distress.  Skin is warm and dry.  Eyes visual acuity appears to be intact sclera and conjunctive are clear.  Oropharynx clear mucous membranes moist.  Chest is largely clear to auscultation there is just a minimal wheeze at the bases.  There is no labored breathing.  Heart is regular rate and rhythm with a baseline systolic murmur she has baseline lower extremity edema.  Abdomen is soft nontender with positive bowel sounds.  Musculoskeletal is able to move her extremities at baseline  she does have baseline lower extremity weakness.  Neurologic appears grossly intact cannot appreciate lateralizing findings her speech is clear.  Psych she is oriented to self she is pleasant appropriate again just on initial exam but appeared to be calm or when I reevaluated her.  .  Labs.  April 23, 2020.  INR 2.6.  April 10, 2020.  WBC 9.0 hemoglobin 9.7 platelets 315.  April 03, 2020.  WBC 7.7 hemoglobin 9.5 platelets 296.  Sodium 136 potassium 4.7 BUN 26.0 creatinine 0.78.  Marland Kitchen  Assessment and plan.  Shortness of breath-this could be more anxiety related-on reevaluation she did not really have complaints of this she says she was feeling better.  She does have orders for Xanax once a day.  Physical exam was fairly benign-there was a small amount of wheezing but I do not believe that was new-out of an abundance of caution a chest x-ray was ordered and was negative for any acute process.  They did apply oxygen for comfort-O2 saturations were stable well into the 90s.  At this point have ordered vital signs every 4 hours x 3 just to keep an eye on the situation and make sure she continues to be stable.  2.  History of DVT PE-INR is therapeutic at 2.6 on lab done yesterday-this will be rechecked next week.-Continues on Coumadin 2 mg a day   3.  Hypertension when Dr. Mariea Clonts saw her there were some elevated blood pressures and it was somewhat elevated today I suspect this was due to anxiety-Per review of blood pressures however I do not see consistent elevations over systolically [id number] recent readings 138/62-136/61-116/78.  At this point will monitor-she continues on Cozaar 50 mg nightly as well as Norvasc 10 mg a day  [id number]-of note greater than 35 minutes spent assessing patient-reviewing her chart and labs-reevaluating patient-discussing her status with nursing-and coordinating and formulating a plan of care--of note greater than 50% of time spent coordinating a plan of  care with input as noted above

## 2020-04-27 ENCOUNTER — Encounter: Payer: Self-pay | Admitting: Internal Medicine

## 2020-04-29 DIAGNOSIS — R4789 Other speech disturbances: Secondary | ICD-10-CM | POA: Diagnosis not present

## 2020-04-29 DIAGNOSIS — I10 Essential (primary) hypertension: Secondary | ICD-10-CM | POA: Diagnosis not present

## 2020-04-29 DIAGNOSIS — G319 Degenerative disease of nervous system, unspecified: Secondary | ICD-10-CM | POA: Diagnosis not present

## 2020-04-29 DIAGNOSIS — N3000 Acute cystitis without hematuria: Secondary | ICD-10-CM | POA: Diagnosis not present

## 2020-04-29 DIAGNOSIS — R778 Other specified abnormalities of plasma proteins: Secondary | ICD-10-CM | POA: Diagnosis not present

## 2020-04-29 DIAGNOSIS — R5381 Other malaise: Secondary | ICD-10-CM | POA: Diagnosis not present

## 2020-04-29 DIAGNOSIS — M353 Polymyalgia rheumatica: Secondary | ICD-10-CM | POA: Diagnosis not present

## 2020-04-29 DIAGNOSIS — R001 Bradycardia, unspecified: Secondary | ICD-10-CM | POA: Diagnosis not present

## 2020-04-29 DIAGNOSIS — I129 Hypertensive chronic kidney disease with stage 1 through stage 4 chronic kidney disease, or unspecified chronic kidney disease: Secondary | ICD-10-CM | POA: Diagnosis not present

## 2020-04-29 DIAGNOSIS — Z01818 Encounter for other preprocedural examination: Secondary | ICD-10-CM | POA: Diagnosis not present

## 2020-04-29 DIAGNOSIS — R410 Disorientation, unspecified: Secondary | ICD-10-CM | POA: Diagnosis not present

## 2020-04-29 DIAGNOSIS — E785 Hyperlipidemia, unspecified: Secondary | ICD-10-CM | POA: Diagnosis not present

## 2020-04-29 DIAGNOSIS — G459 Transient cerebral ischemic attack, unspecified: Secondary | ICD-10-CM | POA: Diagnosis not present

## 2020-04-29 DIAGNOSIS — G9341 Metabolic encephalopathy: Secondary | ICD-10-CM | POA: Diagnosis not present

## 2020-04-29 DIAGNOSIS — J3489 Other specified disorders of nose and nasal sinuses: Secondary | ICD-10-CM | POA: Diagnosis not present

## 2020-04-29 DIAGNOSIS — Z7901 Long term (current) use of anticoagulants: Secondary | ICD-10-CM | POA: Diagnosis not present

## 2020-04-29 DIAGNOSIS — N390 Urinary tract infection, site not specified: Secondary | ICD-10-CM | POA: Diagnosis not present

## 2020-04-29 DIAGNOSIS — R404 Transient alteration of awareness: Secondary | ICD-10-CM | POA: Diagnosis not present

## 2020-04-29 DIAGNOSIS — I6389 Other cerebral infarction: Secondary | ICD-10-CM | POA: Diagnosis not present

## 2020-04-29 DIAGNOSIS — R1310 Dysphagia, unspecified: Secondary | ICD-10-CM | POA: Diagnosis not present

## 2020-04-29 DIAGNOSIS — I700 Atherosclerosis of aorta: Secondary | ICD-10-CM | POA: Diagnosis not present

## 2020-04-29 DIAGNOSIS — M2548 Effusion, other site: Secondary | ICD-10-CM | POA: Diagnosis not present

## 2020-04-29 DIAGNOSIS — J9601 Acute respiratory failure with hypoxia: Secondary | ICD-10-CM | POA: Diagnosis not present

## 2020-04-29 DIAGNOSIS — R4781 Slurred speech: Secondary | ICD-10-CM | POA: Diagnosis not present

## 2020-04-29 DIAGNOSIS — R7989 Other specified abnormal findings of blood chemistry: Secondary | ICD-10-CM | POA: Diagnosis not present

## 2020-04-29 DIAGNOSIS — J986 Disorders of diaphragm: Secondary | ICD-10-CM | POA: Diagnosis not present

## 2020-04-29 DIAGNOSIS — I6782 Cerebral ischemia: Secondary | ICD-10-CM | POA: Diagnosis not present

## 2020-04-29 DIAGNOSIS — I083 Combined rheumatic disorders of mitral, aortic and tricuspid valves: Secondary | ICD-10-CM | POA: Diagnosis not present

## 2020-04-29 DIAGNOSIS — R0602 Shortness of breath: Secondary | ICD-10-CM | POA: Diagnosis not present

## 2020-04-29 DIAGNOSIS — N183 Chronic kidney disease, stage 3 unspecified: Secondary | ICD-10-CM | POA: Diagnosis not present

## 2020-04-29 DIAGNOSIS — R4182 Altered mental status, unspecified: Secondary | ICD-10-CM | POA: Diagnosis not present

## 2020-04-29 DIAGNOSIS — F0390 Unspecified dementia without behavioral disturbance: Secondary | ICD-10-CM | POA: Diagnosis not present

## 2020-04-29 DIAGNOSIS — I6523 Occlusion and stenosis of bilateral carotid arteries: Secondary | ICD-10-CM | POA: Diagnosis not present

## 2020-04-29 DIAGNOSIS — G2581 Restless legs syndrome: Secondary | ICD-10-CM | POA: Diagnosis not present

## 2020-04-30 DIAGNOSIS — Z7401 Bed confinement status: Secondary | ICD-10-CM | POA: Diagnosis not present

## 2020-04-30 DIAGNOSIS — M2550 Pain in unspecified joint: Secondary | ICD-10-CM | POA: Diagnosis not present

## 2020-04-30 DIAGNOSIS — R001 Bradycardia, unspecified: Secondary | ICD-10-CM | POA: Diagnosis not present

## 2020-04-30 DIAGNOSIS — N183 Chronic kidney disease, stage 3 unspecified: Secondary | ICD-10-CM | POA: Diagnosis not present

## 2020-04-30 DIAGNOSIS — I129 Hypertensive chronic kidney disease with stage 1 through stage 4 chronic kidney disease, or unspecified chronic kidney disease: Secondary | ICD-10-CM | POA: Diagnosis not present

## 2020-04-30 DIAGNOSIS — J9601 Acute respiratory failure with hypoxia: Secondary | ICD-10-CM | POA: Diagnosis not present

## 2020-04-30 DIAGNOSIS — R0602 Shortness of breath: Secondary | ICD-10-CM | POA: Diagnosis not present

## 2020-04-30 DIAGNOSIS — G9341 Metabolic encephalopathy: Secondary | ICD-10-CM | POA: Diagnosis not present

## 2020-04-30 DIAGNOSIS — R5381 Other malaise: Secondary | ICD-10-CM | POA: Diagnosis not present

## 2020-05-01 ENCOUNTER — Telehealth: Payer: Self-pay

## 2020-05-01 DIAGNOSIS — R1312 Dysphagia, oropharyngeal phase: Secondary | ICD-10-CM | POA: Diagnosis not present

## 2020-05-01 DIAGNOSIS — G9341 Metabolic encephalopathy: Secondary | ICD-10-CM | POA: Diagnosis not present

## 2020-05-01 DIAGNOSIS — R2681 Unsteadiness on feet: Secondary | ICD-10-CM | POA: Diagnosis not present

## 2020-05-01 DIAGNOSIS — M6281 Muscle weakness (generalized): Secondary | ICD-10-CM | POA: Diagnosis not present

## 2020-05-01 DIAGNOSIS — I69828 Other speech and language deficits following other cerebrovascular disease: Secondary | ICD-10-CM | POA: Diagnosis not present

## 2020-05-01 NOTE — Telephone Encounter (Signed)
Seems like [facility] would know better about what time they are dispensing her different doses of ropinirole.  Please ask them to clarify with the facility about times.

## 2020-05-01 NOTE — Telephone Encounter (Signed)
[facility] states that patient has presciption sent in for "Ropinirole 1mg  and 2mg ." Medication for Ropinirole 1mg  states for patient to take at lunch and bedtime, while Ropinirole 2mg  has patient taking medication 2 hrs before bedtime. Medication times are 12pm,8pm, and 5pm. Pharmacy just wanted to call and make sure that this was correct description and medication. Please Advise.

## 2020-05-02 ENCOUNTER — Non-Acute Institutional Stay (SKILLED_NURSING_FACILITY): Payer: Medicare Other | Admitting: Internal Medicine

## 2020-05-02 ENCOUNTER — Encounter: Payer: Self-pay | Admitting: Internal Medicine

## 2020-05-02 DIAGNOSIS — G9341 Metabolic encephalopathy: Secondary | ICD-10-CM | POA: Diagnosis not present

## 2020-05-02 DIAGNOSIS — R2681 Unsteadiness on feet: Secondary | ICD-10-CM | POA: Diagnosis not present

## 2020-05-02 DIAGNOSIS — I8290 Acute embolism and thrombosis of unspecified vein: Secondary | ICD-10-CM

## 2020-05-02 DIAGNOSIS — Z7901 Long term (current) use of anticoagulants: Secondary | ICD-10-CM

## 2020-05-02 DIAGNOSIS — I10 Essential (primary) hypertension: Secondary | ICD-10-CM

## 2020-05-02 DIAGNOSIS — M6281 Muscle weakness (generalized): Secondary | ICD-10-CM | POA: Diagnosis not present

## 2020-05-02 DIAGNOSIS — N3000 Acute cystitis without hematuria: Secondary | ICD-10-CM | POA: Diagnosis not present

## 2020-05-02 DIAGNOSIS — G301 Alzheimer's disease with late onset: Secondary | ICD-10-CM

## 2020-05-02 DIAGNOSIS — F0280 Dementia in other diseases classified elsewhere without behavioral disturbance: Secondary | ICD-10-CM

## 2020-05-02 DIAGNOSIS — R0602 Shortness of breath: Secondary | ICD-10-CM

## 2020-05-02 DIAGNOSIS — R1312 Dysphagia, oropharyngeal phase: Secondary | ICD-10-CM | POA: Diagnosis not present

## 2020-05-02 DIAGNOSIS — Z66 Do not resuscitate: Secondary | ICD-10-CM

## 2020-05-02 DIAGNOSIS — I69828 Other speech and language deficits following other cerebrovascular disease: Secondary | ICD-10-CM | POA: Diagnosis not present

## 2020-05-02 DIAGNOSIS — M353 Polymyalgia rheumatica: Secondary | ICD-10-CM

## 2020-05-02 DIAGNOSIS — R930 Abnormal findings on diagnostic imaging of skull and head, not elsewhere classified: Secondary | ICD-10-CM | POA: Diagnosis not present

## 2020-05-02 NOTE — Telephone Encounter (Signed)
I called [name] [name] states that she will call [employer] [employer] and clarify with them.

## 2020-05-02 NOTE — Progress Notes (Signed)
Provider:  Rexene [name]. [name] [name], D.O., C.M.D. Location:  [place] Room Number: [place] of Service:  SNF (31)  PCP: [name] Curry, DO Patient Care Team: [name] Curry, DO as PCP - General (Geriatric Medicine) Rehab, [place] ([place])  Extended Emergency Contact Information Primary Emergency Contact: [name] [name],[name] [name] Address: [place], [place] [address] [name] [name] of [place] Phone: [phone] Mobile Phone: [phone] Relation: Son Secondary Emergency Contact: [place] Phone: [phone] Relation: Daughter  Code Status: DNR Goals of Care: Advanced Directive information Advanced Directives 05/02/2020  Does Patient Have a Medical Advance Directive? Yes  Type of Advance Directive Out of facility DNR (pink MOST or yellow form)  Does patient want to make changes to medical advance directive? No - Guardian declined  Copy of [place] in Chart? -  Pre-existing out of facility DNR order (yellow form or pink MOST form) Pink MOST/Yellow Form most recent copy in chart - Physician notified to receive inpatient order      Chief Complaint  Patient presents with  . New Admit To SNF    New Admit to SNF     HPI: Patient is a 86 y.o. female with dementia, h/o PE on anticoagulation with warfarin, htn, hyperlipidemia, prior MI, PMR who lives here for long-term care seen today for readmission to [facility] s/p hospitalization at [facility] from 6/29-6/30/21 with delirium.   She was ruled out for TIA and diagnosed with acute cystitis.  She also had acute hypoxic respiratory failure per the d/c summary but appears this was added due to her oxygen use which had been added for dyspnea a few weeks prior and was meant to be for comfort and temporary--should be weaned. CXR was negative.  BNP normal.        Remarkable labs were  elevated troponin of 27 and mild anemia.  UA was only a little dirty but ucx grew out lactose fermenting gram negative rods and pt was treated with levaquin 500mg  daily x 2 doses and sent here on 3 more doses of 250mg  based on her creatinine.    MRI brain was negative for acute stroke--she did have a 35mm mass in the right sella.  Neurology was consulted and apparently observation recommended.  hba1c was 5.5.  LDL 89 and she's 96, but lipitor got added again.  She had a ST consult and was initially on a ground diet, but then mechanical soft, staying upright during meals and aspiration precautions overall recommended after pharyngeal function study was done.  Carotid dopplers showed just 1-39% stenosis.  Past Medical History:  Diagnosis Date  . Anemia 03/02/2012  . Angina pectoris, unstable ([place]) 03/02/2012  . Anxiety   . Axillary abscess   . Bradycardia 06/13/2012  . CAD (coronary artery disease)    S/p PTCA / stenting (last cath 2004, multiple LAD stents, 2 stents in the right coronary artery all patent)  . Cancer ([place])   . Complication of anesthesia   . Compression fracture of L2 ([place]) 03/12/2017  . Dementia ([place])   . Dysphagia 10/06/2012  . Dyspnea 02/03/2012  . GERD (gastroesophageal reflux disease)   . History of pulmonary embolus (PE) 03/19/2017  . HTN (hypertension)   . PMR (polymyalgia rheumatica) (HCC)   . PONV (postoperative nausea and vomiting)   . Restless leg syndrome   .  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 Started coumadin  . Weakness of both legs 06/09/2012   Past Surgical History:  Procedure Laterality Date  . BREAST LUMPECTOMY    . COLECTOMY    . ESOPHAGOGASTRODUODENOSCOPY (EGD) WITH ESOPHAGEAL DILATION  10/10/2012   Procedure: ESOPHAGOGASTRODUODENOSCOPY (EGD) WITH ESOPHAGEAL DILATION;  Surgeon: Inda Castle, MD;  Location: [place];  Service: Endoscopy;  Laterality: N/A;  . FEMUR IM NAIL Left 12/13/2018   Procedure: INTRAMEDULLARY (IM) NAIL FEMORAL LEFT;   Surgeon: Paralee Cancel, MD;  Location: [facility];  Service: Orthopedics;  Laterality: Left;  . heart stents  x 8  . INCISION AND DRAINAGE     bilateral axillary, non specific staff  . INCISION AND DRAINAGE ABSCESS  09/28/2012   Procedure: INCISION AND DRAINAGE ABSCESS;  Surgeon: Zenovia Jarred, MD;  Location: [place];  Service: General;  Laterality: Bilateral;  . stent     cardiac x 8 stents.    Social History   Socioeconomic History  . Marital status: Widowed    Spouse name: Not on file  . Number of children: Not on file  . Years of education: Not on file  . Highest education level: Not on file  Occupational History  . Occupation: housewife  Tobacco Use  . Smoking status: Never Smoker  . Smokeless tobacco: Never Used  Vaping Use  . Vaping Use: Never used  Substance and Sexual Activity  . Alcohol use: No  . Drug use: No  . Sexual activity: Never  Other Topics Concern  . Not on file  Social History Narrative   She lives at home with her son and is still ambulatory daily with a cane or walker. She denies any history of smoking. Marland Kitchen.Mother died at 65 from a myocardial infarction.  Father    died at 25 from a stroke, possible history of PE. She has nine brothers and    sisters, one sister with bypass and a brother with bypass, also has a    brother who died abruptly of a pulmonary embolism.      Admitted to [facility] 03/16/17   Alcohol none   DNR      Social Determinants of Health   Financial Resource Strain:   . Difficulty of Paying Living Expenses:   Food Insecurity:   . Worried About Charity fundraiser in the Last Year:   . Arboriculturist in the Last Year:   Transportation Needs:   . Film/video editor (Medical):   Marland Kitchen Lack of Transportation (Non-Medical):   Physical Activity:   . Days of Exercise per Week:   . Minutes of Exercise per Session:   Stress:   . Feeling of Stress :   Social Connections:   . Frequency of Communication with Friends and  Family:   . Frequency of Social Gatherings with Friends and Family:   . Attends Religious Services:   . Active Member of Clubs or Organizations:   . Attends Archivist Meetings:   Marland Kitchen Marital Status:     reports that she has never smoked. She has never used smokeless tobacco. She reports that she does not drink alcohol and does not use drugs.  Functional Status Survey:    Family History  Problem Relation Age of Onset  . Heart failure Mother   . Stroke Father   . Breast cancer Sister   . Heart disease Sister   . Heart disease Brother  Health Maintenance  Topic Date Due  . TETANUS/TDAP  10/13/2020 (Originally 07/23/1940)  . INFLUENZA VACCINE  06/01/2020  . DEXA SCAN  Completed  . COVID-19 Vaccine  Completed  . PNA vac Low Risk Adult  Completed    Allergies  Allergen Reactions  . Ambien [Zolpidem Tartrate] Nausea And Vomiting  . Codeine   . Codeine Phosphate Nausea And Vomiting  . Darvocet [Propoxyphene N-Acetaminophen] Nausea And Vomiting  . Fish Allergy   . Fish-Derived Products   . Hydrocodone   . Promethazine   . Promethazine Hcl Nausea And Vomiting  . Sulfamethoxazole Other (See Comments)    Pt doesn't remember reaction  . Vicodin [Hydrocodone-Acetaminophen] Other (See Comments)    Unknown reaction  . Amoxicillin Nausea And Vomiting and Rash  . Penicillins Nausea And Vomiting and Rash    Outpatient Encounter Medications as of 05/02/2020  Medication Sig  . acetaminophen (TYLENOL) 325 MG tablet Take 650 mg by mouth every 6 (six) hours as needed for mild pain, moderate pain or fever.  Marland Kitchen acetaminophen (TYLENOL) 325 MG tablet Take 650 mg by mouth every 6 (six) hours. Take routinely for left hip pain  . albuterol (PROAIR HFA) 108 (90 Base) MCG/ACT inhaler Inhale 2 puffs into the lungs every 6 (six) hours as needed for wheezing or shortness of breath.  . ALPRAZolam (XANAX) 0.5 MG tablet Take 0.5 tablets (0.25 mg total) by mouth daily.  Marland Kitchen amLODipine (NORVASC)  10 MG tablet Take 10 mg by mouth every morning.   Marland Kitchen atorvastatin (LIPITOR) 10 MG tablet Take 10 mg by mouth every evening.  . calcium carbonate (TUMS) 500 MG chewable tablet Chew 1 tablet by mouth 3 (three) times daily as needed for indigestion or heartburn.  . Cholecalciferol (VITAMIN D3) 1.25 MG (50000 UT) CAPS Take 1 capsule by mouth once a week.  . donepezil (ARICEPT) 10 MG tablet Take 10 mg by mouth every evening.   Marland Kitchen losartan (COZAAR) 50 MG tablet Take 50 mg by mouth at bedtime.   . nitroGLYCERIN (NITROSTAT) 0.4 MG SL tablet Place 0.4 mg under the tongue every 5 (five) minutes as needed for chest pain.  . NON FORMULARY Diet Order: Mech Soft Liberalized Diet.  . Nutritional Supplements (NUTRITIONAL SUPPLEMENT PO) Take 1 each by mouth 2 (two) times a day. Magic Cup - take with lunch and dinner  . ondansetron (ZOFRAN) 4 MG tablet Take 4 mg by mouth every 4 (four) hours as needed for nausea.   . polyethylene glycol (MIRALAX / GLYCOLAX) packet Take 17 g by mouth daily as needed for moderate constipation.  . predniSONE (DELTASONE) 1 MG tablet Take 1 mg by mouth daily with breakfast.   . rOPINIRole (REQUIP) 1 MG tablet 1mg  at noon, 1mg  2 hours before bed, then 1mg  at bedtime  . senna-docusate (SENOKOT-S) 8.6-50 MG tablet Take 1 tablet by mouth 2 (two) times daily.  Marland Kitchen SILENOR 6 MG TABS Take 0.5 tablets (3 mg total) by mouth at bedtime.  . traMADol (ULTRAM) 50 MG tablet Take 1 tablet (50 mg total) by mouth 2 (two) times daily.  Marland Kitchen warfarin (COUMADIN) 2 MG tablet Take 2 mg by mouth daily.    No facility-administered encounter medications on file as of 05/02/2020.    Review of Systems  Constitutional: Positive for malaise/fatigue. Negative for chills and fever.  HENT: Positive for hearing loss.   Eyes: Negative for blurred vision.  Respiratory: Positive for cough. Negative for shortness of breath.   Cardiovascular: Negative for chest pain,  palpitations and leg swelling.  Gastrointestinal: Negative  for abdominal pain.  Genitourinary: Negative for dysuria.  Musculoskeletal: Negative for falls and joint pain.  Neurological: Negative for dizziness and loss of consciousness.  Endo/Heme/Allergies: Bruises/bleeds easily.  Psychiatric/Behavioral: Positive for memory loss.    Vitals:   05/02/20 1336  BP: 117/68  Pulse: 68  Temp: 98.7 F (37.1 C)  SpO2: 98%  Weight: 137 lb 3.2 oz (62.2 kg)  Height: 5\' 1"  (1.549 m)   Body mass index is 25.92 kg/m. Physical Exam Vitals reviewed.  Constitutional:      General: She is not in acute distress.    Appearance: Normal appearance. She is not toxic-appearing.  HENT:     Head: Normocephalic and atraumatic.     Right Ear: External ear normal.     Left Ear: External ear normal.     Nose: Nose normal.     Mouth/Throat:     Pharynx: Oropharynx is clear.  Eyes:     Extraocular Movements: Extraocular movements intact.     Conjunctiva/sclera: Conjunctivae normal.     Pupils: Pupils are equal, round, and reactive to light.  Cardiovascular:     Rate and Rhythm: Normal rate and regular rhythm.     Pulses: Normal pulses.     Heart sounds: Normal heart sounds.  Pulmonary:     Effort: Pulmonary effort is normal.     Breath sounds: Normal breath sounds. No wheezing, rhonchi or rales.     Comments: Wearing 2L O2 Abdominal:     General: Bowel sounds are normal. There is no distension.     Palpations: Abdomen is soft.     Tenderness: There is no abdominal tenderness.  Musculoskeletal:        General: Normal range of motion.     Cervical back: Neck supple.     Right lower leg: No edema.     Left lower leg: No edema.  Skin:    General: Skin is warm and dry.  Neurological:     General: No focal deficit present.     Mental Status: She is alert. Mental status is at baseline.     Motor: Weakness present.     Gait: Gait abnormal.     Comments: Uses manual wheelchair; short-term memory loss  Psychiatric:        Mood and Affect: Mood normal.          Behavior: Behavior normal.     Labs reviewed: Basic Metabolic Panel: Recent Labs    11/23/19 0000 11/26/19 0000 12/03/19 0000  NA 138 136* 143  K 3.9 5.3 3.7  CL 106 103 109*  CO2 16 18 18   BUN 21 23* 15  CREATININE 0.9 1.0 0.8  CALCIUM 8.8 8.8 9.1   Liver Function Tests: Recent Labs    06/12/19 0000  AST 14  ALT 12  ALKPHOS 49   No results for input(s): LIPASE, AMYLASE in the last 8760 hours. No results for input(s): AMMONIA in the last 8760 hours. CBC: Recent Labs    11/23/19 0000 11/26/19 0000 12/03/19 0000  WBC 18.2 14.7 8.9  NEUTROABS 18 12 7   HGB 11.1* 9.4* 11.6*  HCT 35* 29* 36  PLT 312 224 461*   Cardiac Enzymes: No results for input(s): CKTOTAL, CKMB, CKMBINDEX, TROPONINI in the last 8760 hours. BNP: Invalid input(s): POCBNP Lab Results  Component Value Date   HGBA1C 5.7 08/01/2018   Lab Results  Component Value Date   TSH 1.71 08/01/2018  No results found for: VITAMINB12 No results found for: FOLATE Lab Results  Component Value Date   IRON 28 (L) 03/02/2012   TIBC 386 03/02/2012   FERRITIN 8 (L) 03/02/2012    Imaging and Procedures obtained prior to SNF admission: DG Lumbar Spine Complete  Result Date: 12/11/2018 CLINICAL DATA:  Recent fall with low back pain EXAM: LUMBAR SPINE - COMPLETE 4+ VIEW COMPARISON:  03/08/2017 FINDINGS: Stable compression deformities of L2 and L3 are noted. Degenerative changes are noted worst at L4-5 facet hypertrophic changes are noted. No acute bony abnormality is seen. IMPRESSION: Chronic degenerative changes and compression fractures without acute abnormality. Electronically Signed   By: Inez Catalina M.D.   On: 12/11/2018 22:28   DG Pelvis 1-2 Views  Result Date: 12/11/2018 CLINICAL DATA:  Recent fall with left leg pain, initial encounter EXAM: PELVIS - 1-2 VIEW COMPARISON:  None. FINDINGS: Pelvic ring is intact. Comminuted intratrochanteric left femoral fracture is noted with impaction and mild  angulation at the fracture site. Femoral head is well seated. IMPRESSION: Comminuted intratrochanteric left femoral fracture. Electronically Signed   By: Inez Catalina M.D.   On: 12/11/2018 22:27   CT HEAD WO CONTRAST  Result Date: 12/11/2018 CLINICAL DATA:  Recent fall EXAM: CT HEAD WITHOUT CONTRAST CT CERVICAL SPINE WITHOUT CONTRAST TECHNIQUE: Multidetector CT imaging of the head and cervical spine was performed following the standard protocol without intravenous contrast. Multiplanar CT image reconstructions of the cervical spine were also generated. COMPARISON:  11/14/15 FINDINGS: CT HEAD FINDINGS Brain: Chronic white matter ischemic changes are noted. No findings to suggest acute hemorrhage, acute infarction or space-occupying mass lesion are noted. Dense lesion is again identified within the sella stable from the prior exam. Vascular: No hyperdense vessel or unexpected calcification. Skull: Normal. Negative for fracture or focal lesion. Sinuses/Orbits: No acute finding. Other: None CT CERVICAL SPINE FINDINGS Alignment: Within normal limits. Skull base and vertebrae: 7 cervical segments are well visualized. Vertebral body height is well maintained. No acute fracture or acute facet abnormality is noted. Facet hypertrophic changes are seen. No significant neural foraminal narrowing is noted. Soft tissues and spinal canal: Surrounding soft tissues are unremarkable. Upper chest: Visualized upper lung fields are within normal limits. Other: None IMPRESSION: CT of the head: No change from the prior exam. No acute abnormality noted. CT of the cervical spine: Multilevel degenerative change without acute abnormality. Electronically Signed   By: Inez Catalina M.D.   On: 12/11/2018 22:16   CT CERVICAL SPINE WO CONTRAST  Result Date: 12/11/2018 CLINICAL DATA:  Recent fall EXAM: CT HEAD WITHOUT CONTRAST CT CERVICAL SPINE WITHOUT CONTRAST TECHNIQUE: Multidetector CT imaging of the head and cervical spine was performed  following the standard protocol without intravenous contrast. Multiplanar CT image reconstructions of the cervical spine were also generated. COMPARISON:  11/14/15 FINDINGS: CT HEAD FINDINGS Brain: Chronic white matter ischemic changes are noted. No findings to suggest acute hemorrhage, acute infarction or space-occupying mass lesion are noted. Dense lesion is again identified within the sella stable from the prior exam. Vascular: No hyperdense vessel or unexpected calcification. Skull: Normal. Negative for fracture or focal lesion. Sinuses/Orbits: No acute finding. Other: None CT CERVICAL SPINE FINDINGS Alignment: Within normal limits. Skull base and vertebrae: 7 cervical segments are well visualized. Vertebral body height is well maintained. No acute fracture or acute facet abnormality is noted. Facet hypertrophic changes are seen. No significant neural foraminal narrowing is noted. Soft tissues and spinal canal: Surrounding soft tissues are unremarkable. Upper  chest: Visualized upper lung fields are within normal limits. Other: None IMPRESSION: CT of the head: No change from the prior exam. No acute abnormality noted. CT of the cervical spine: Multilevel degenerative change without acute abnormality. Electronically Signed   By: Inez Catalina M.D.   On: 12/11/2018 22:16   Chest Portable 1 View  Result Date: 12/12/2018 CLINICAL DATA:  Preop fractured hip EXAM: PORTABLE CHEST 1 VIEW COMPARISON:  03/11/2017 FINDINGS: Elevation of the right hemidiaphragm. No confluent airspace opacities or effusions. Heart is normal size. No acute bony abnormality. IMPRESSION: Stable chronic elevation of the right hemidiaphragm. No active disease. Electronically Signed   By: Rolm Baptise M.D.   On: 12/12/2018 00:30   DG FEMUR MIN 2 VIEWS LEFT  Result Date: 12/11/2018 CLINICAL DATA:  Recent fall with left leg pain, initial encounter EXAM: LEFT FEMUR 2 VIEWS COMPARISON:  None. FINDINGS: Comminuted left intratrochanteric femoral  fracture is noted. Some impaction and angulation at the fracture site is noted. No other fracture is seen. IMPRESSION: Left intratrochanteric femoral fracture. Electronically Signed   By: Inez Catalina M.D.   On: 12/11/2018 22:29   [place] EKG SITE RITE  Result Date: 12/12/2018 If Site Rite image not attached, placement could not be confirmed due to current cardiac rhythm.   Assessment/Plan 1. Acute cystitis without hematuria -no symptoms now, completing her last three doses of levaquin here -avoid overtesting to prevent abx resistance   2. Mass in region of sella turcica present on magnetic resonance imaging -noted but observation was recommended during hospitalization at advanced age and with cognitive losses plus absence of any symptoms related to it  3. Late onset Alzheimer's disease without behavioral disturbance ([place]) -goals are comfort-based -try to avoid hospitalizations by doing labs here and treating accordingly  4. HYPERTENSION, BENIGN -bp has been controlled, cont same regimen and monitor  5. VTE (venous thromboembolism) -has had and on coumadin  6. Anticoagulated on Coumadin -continue for goal INR 2-3; might discuss NOACs with family to avoid so many labs for this sweet lady with dementia -on 2mg  daily now  7. SOB (shortness of breath) -uses oxygen if needed for this, has some cough, monitor and try to wean O2  8. DNR (do not resuscitate) -order reentered  38. PMR (polymyalgia rheumatica) (HCC) -remains on just 1mg  prednisone daily -might consider gradual wean of this at routine visit  Family/ staff Communication: discussed with snf nurse  Labs/tests ordered:  Cbc, bmp  Chanoch Mccleery L. Saranne Crislip, D.O. [facility] [address], [place] [address] Cell Phone (Mon-Fri 8am-5pm):  [phone] On Call:  [phone] & follow prompts after 5pm & weekends Office Phone:  [phone] Office Fax:  [phone]

## 2020-05-04 DIAGNOSIS — J069 Acute upper respiratory infection, unspecified: Secondary | ICD-10-CM | POA: Diagnosis not present

## 2020-05-05 DIAGNOSIS — R1312 Dysphagia, oropharyngeal phase: Secondary | ICD-10-CM | POA: Diagnosis not present

## 2020-05-05 DIAGNOSIS — R2681 Unsteadiness on feet: Secondary | ICD-10-CM | POA: Diagnosis not present

## 2020-05-05 DIAGNOSIS — G9341 Metabolic encephalopathy: Secondary | ICD-10-CM | POA: Diagnosis not present

## 2020-05-05 DIAGNOSIS — I69828 Other speech and language deficits following other cerebrovascular disease: Secondary | ICD-10-CM | POA: Diagnosis not present

## 2020-05-05 DIAGNOSIS — M6281 Muscle weakness (generalized): Secondary | ICD-10-CM | POA: Diagnosis not present

## 2020-05-06 DIAGNOSIS — D649 Anemia, unspecified: Secondary | ICD-10-CM | POA: Diagnosis not present

## 2020-05-06 DIAGNOSIS — M6281 Muscle weakness (generalized): Secondary | ICD-10-CM | POA: Diagnosis not present

## 2020-05-06 DIAGNOSIS — I10 Essential (primary) hypertension: Secondary | ICD-10-CM | POA: Diagnosis not present

## 2020-05-06 DIAGNOSIS — G9341 Metabolic encephalopathy: Secondary | ICD-10-CM | POA: Diagnosis not present

## 2020-05-06 DIAGNOSIS — R2681 Unsteadiness on feet: Secondary | ICD-10-CM | POA: Diagnosis not present

## 2020-05-06 DIAGNOSIS — I69828 Other speech and language deficits following other cerebrovascular disease: Secondary | ICD-10-CM | POA: Diagnosis not present

## 2020-05-06 DIAGNOSIS — R1312 Dysphagia, oropharyngeal phase: Secondary | ICD-10-CM | POA: Diagnosis not present

## 2020-05-06 LAB — CBC AND DIFFERENTIAL
HCT: 28 — AB (ref 36–46)
Hemoglobin: 8.6 — AB (ref 12.0–16.0)
Platelets: 291 (ref 150–399)
WBC: 11.8

## 2020-05-06 LAB — BASIC METABOLIC PANEL
BUN: 27 — AB (ref 4–21)
CO2: 21 (ref 13–22)
Chloride: 104 (ref 99–108)
Creatinine: 0.9 (ref 0.5–1.1)
Glucose: 96
Potassium: 4.7 (ref 3.4–5.3)
Sodium: 137 (ref 137–147)

## 2020-05-06 LAB — CBC: RBC: 3.35 — AB (ref 3.87–5.11)

## 2020-05-06 LAB — COMPREHENSIVE METABOLIC PANEL: Calcium: 8.9 (ref 8.7–10.7)

## 2020-05-07 DIAGNOSIS — M6281 Muscle weakness (generalized): Secondary | ICD-10-CM | POA: Diagnosis not present

## 2020-05-07 DIAGNOSIS — R2681 Unsteadiness on feet: Secondary | ICD-10-CM | POA: Diagnosis not present

## 2020-05-07 DIAGNOSIS — R1312 Dysphagia, oropharyngeal phase: Secondary | ICD-10-CM | POA: Diagnosis not present

## 2020-05-07 DIAGNOSIS — G9341 Metabolic encephalopathy: Secondary | ICD-10-CM | POA: Diagnosis not present

## 2020-05-07 DIAGNOSIS — I69828 Other speech and language deficits following other cerebrovascular disease: Secondary | ICD-10-CM | POA: Diagnosis not present

## 2020-05-08 DIAGNOSIS — G9341 Metabolic encephalopathy: Secondary | ICD-10-CM | POA: Diagnosis not present

## 2020-05-08 DIAGNOSIS — I69828 Other speech and language deficits following other cerebrovascular disease: Secondary | ICD-10-CM | POA: Diagnosis not present

## 2020-05-08 DIAGNOSIS — M6281 Muscle weakness (generalized): Secondary | ICD-10-CM | POA: Diagnosis not present

## 2020-05-08 DIAGNOSIS — R2681 Unsteadiness on feet: Secondary | ICD-10-CM | POA: Diagnosis not present

## 2020-05-08 DIAGNOSIS — R1312 Dysphagia, oropharyngeal phase: Secondary | ICD-10-CM | POA: Diagnosis not present

## 2020-05-09 ENCOUNTER — Encounter: Payer: Self-pay | Admitting: Internal Medicine

## 2020-05-09 DIAGNOSIS — I69828 Other speech and language deficits following other cerebrovascular disease: Secondary | ICD-10-CM | POA: Diagnosis not present

## 2020-05-09 DIAGNOSIS — R2681 Unsteadiness on feet: Secondary | ICD-10-CM | POA: Diagnosis not present

## 2020-05-09 DIAGNOSIS — G9341 Metabolic encephalopathy: Secondary | ICD-10-CM | POA: Diagnosis not present

## 2020-05-09 DIAGNOSIS — R930 Abnormal findings on diagnostic imaging of skull and head, not elsewhere classified: Secondary | ICD-10-CM | POA: Insufficient documentation

## 2020-05-09 DIAGNOSIS — N3000 Acute cystitis without hematuria: Secondary | ICD-10-CM | POA: Insufficient documentation

## 2020-05-09 DIAGNOSIS — R1312 Dysphagia, oropharyngeal phase: Secondary | ICD-10-CM | POA: Diagnosis not present

## 2020-05-09 DIAGNOSIS — M6281 Muscle weakness (generalized): Secondary | ICD-10-CM | POA: Diagnosis not present

## 2020-05-10 DIAGNOSIS — I69828 Other speech and language deficits following other cerebrovascular disease: Secondary | ICD-10-CM | POA: Diagnosis not present

## 2020-05-10 DIAGNOSIS — G9341 Metabolic encephalopathy: Secondary | ICD-10-CM | POA: Diagnosis not present

## 2020-05-10 DIAGNOSIS — R1312 Dysphagia, oropharyngeal phase: Secondary | ICD-10-CM | POA: Diagnosis not present

## 2020-05-10 DIAGNOSIS — M6281 Muscle weakness (generalized): Secondary | ICD-10-CM | POA: Diagnosis not present

## 2020-05-10 DIAGNOSIS — R2681 Unsteadiness on feet: Secondary | ICD-10-CM | POA: Diagnosis not present

## 2020-05-12 DIAGNOSIS — M6281 Muscle weakness (generalized): Secondary | ICD-10-CM | POA: Diagnosis not present

## 2020-05-12 DIAGNOSIS — G9341 Metabolic encephalopathy: Secondary | ICD-10-CM | POA: Diagnosis not present

## 2020-05-12 DIAGNOSIS — I69828 Other speech and language deficits following other cerebrovascular disease: Secondary | ICD-10-CM | POA: Diagnosis not present

## 2020-05-12 DIAGNOSIS — R1312 Dysphagia, oropharyngeal phase: Secondary | ICD-10-CM | POA: Diagnosis not present

## 2020-05-12 DIAGNOSIS — R2681 Unsteadiness on feet: Secondary | ICD-10-CM | POA: Diagnosis not present

## 2020-05-13 DIAGNOSIS — G9341 Metabolic encephalopathy: Secondary | ICD-10-CM | POA: Diagnosis not present

## 2020-05-13 DIAGNOSIS — I69828 Other speech and language deficits following other cerebrovascular disease: Secondary | ICD-10-CM | POA: Diagnosis not present

## 2020-05-13 DIAGNOSIS — M6281 Muscle weakness (generalized): Secondary | ICD-10-CM | POA: Diagnosis not present

## 2020-05-13 DIAGNOSIS — R1312 Dysphagia, oropharyngeal phase: Secondary | ICD-10-CM | POA: Diagnosis not present

## 2020-05-13 DIAGNOSIS — R2681 Unsteadiness on feet: Secondary | ICD-10-CM | POA: Diagnosis not present

## 2020-05-14 DIAGNOSIS — I69828 Other speech and language deficits following other cerebrovascular disease: Secondary | ICD-10-CM | POA: Diagnosis not present

## 2020-05-14 DIAGNOSIS — R2681 Unsteadiness on feet: Secondary | ICD-10-CM | POA: Diagnosis not present

## 2020-05-14 DIAGNOSIS — M6281 Muscle weakness (generalized): Secondary | ICD-10-CM | POA: Diagnosis not present

## 2020-05-14 DIAGNOSIS — R1312 Dysphagia, oropharyngeal phase: Secondary | ICD-10-CM | POA: Diagnosis not present

## 2020-05-14 DIAGNOSIS — G9341 Metabolic encephalopathy: Secondary | ICD-10-CM | POA: Diagnosis not present

## 2020-05-15 DIAGNOSIS — I69828 Other speech and language deficits following other cerebrovascular disease: Secondary | ICD-10-CM | POA: Diagnosis not present

## 2020-05-15 DIAGNOSIS — R1312 Dysphagia, oropharyngeal phase: Secondary | ICD-10-CM | POA: Diagnosis not present

## 2020-05-15 DIAGNOSIS — G9341 Metabolic encephalopathy: Secondary | ICD-10-CM | POA: Diagnosis not present

## 2020-05-15 DIAGNOSIS — M6281 Muscle weakness (generalized): Secondary | ICD-10-CM | POA: Diagnosis not present

## 2020-05-15 DIAGNOSIS — R2681 Unsteadiness on feet: Secondary | ICD-10-CM | POA: Diagnosis not present

## 2020-05-16 DIAGNOSIS — M6281 Muscle weakness (generalized): Secondary | ICD-10-CM | POA: Diagnosis not present

## 2020-05-16 DIAGNOSIS — I69828 Other speech and language deficits following other cerebrovascular disease: Secondary | ICD-10-CM | POA: Diagnosis not present

## 2020-05-16 DIAGNOSIS — R1312 Dysphagia, oropharyngeal phase: Secondary | ICD-10-CM | POA: Diagnosis not present

## 2020-05-16 DIAGNOSIS — R2681 Unsteadiness on feet: Secondary | ICD-10-CM | POA: Diagnosis not present

## 2020-05-16 DIAGNOSIS — G9341 Metabolic encephalopathy: Secondary | ICD-10-CM | POA: Diagnosis not present

## 2020-05-19 DIAGNOSIS — G9341 Metabolic encephalopathy: Secondary | ICD-10-CM | POA: Diagnosis not present

## 2020-05-19 DIAGNOSIS — R1312 Dysphagia, oropharyngeal phase: Secondary | ICD-10-CM | POA: Diagnosis not present

## 2020-05-19 DIAGNOSIS — R2681 Unsteadiness on feet: Secondary | ICD-10-CM | POA: Diagnosis not present

## 2020-05-19 DIAGNOSIS — M6281 Muscle weakness (generalized): Secondary | ICD-10-CM | POA: Diagnosis not present

## 2020-05-19 DIAGNOSIS — I69828 Other speech and language deficits following other cerebrovascular disease: Secondary | ICD-10-CM | POA: Diagnosis not present

## 2020-05-20 DIAGNOSIS — I69828 Other speech and language deficits following other cerebrovascular disease: Secondary | ICD-10-CM | POA: Diagnosis not present

## 2020-05-20 DIAGNOSIS — R2681 Unsteadiness on feet: Secondary | ICD-10-CM | POA: Diagnosis not present

## 2020-05-20 DIAGNOSIS — G9341 Metabolic encephalopathy: Secondary | ICD-10-CM | POA: Diagnosis not present

## 2020-05-20 DIAGNOSIS — R1312 Dysphagia, oropharyngeal phase: Secondary | ICD-10-CM | POA: Diagnosis not present

## 2020-05-20 DIAGNOSIS — M6281 Muscle weakness (generalized): Secondary | ICD-10-CM | POA: Diagnosis not present

## 2020-05-21 DIAGNOSIS — R2681 Unsteadiness on feet: Secondary | ICD-10-CM | POA: Diagnosis not present

## 2020-05-21 DIAGNOSIS — G9341 Metabolic encephalopathy: Secondary | ICD-10-CM | POA: Diagnosis not present

## 2020-05-21 DIAGNOSIS — M6281 Muscle weakness (generalized): Secondary | ICD-10-CM | POA: Diagnosis not present

## 2020-05-21 DIAGNOSIS — R1312 Dysphagia, oropharyngeal phase: Secondary | ICD-10-CM | POA: Diagnosis not present

## 2020-05-21 DIAGNOSIS — I69828 Other speech and language deficits following other cerebrovascular disease: Secondary | ICD-10-CM | POA: Diagnosis not present

## 2020-05-22 DIAGNOSIS — I69828 Other speech and language deficits following other cerebrovascular disease: Secondary | ICD-10-CM | POA: Diagnosis not present

## 2020-05-22 DIAGNOSIS — M6281 Muscle weakness (generalized): Secondary | ICD-10-CM | POA: Diagnosis not present

## 2020-05-22 DIAGNOSIS — G9341 Metabolic encephalopathy: Secondary | ICD-10-CM | POA: Diagnosis not present

## 2020-05-22 DIAGNOSIS — R1312 Dysphagia, oropharyngeal phase: Secondary | ICD-10-CM | POA: Diagnosis not present

## 2020-05-22 DIAGNOSIS — R2681 Unsteadiness on feet: Secondary | ICD-10-CM | POA: Diagnosis not present

## 2020-05-23 DIAGNOSIS — G9341 Metabolic encephalopathy: Secondary | ICD-10-CM | POA: Diagnosis not present

## 2020-05-23 DIAGNOSIS — M6281 Muscle weakness (generalized): Secondary | ICD-10-CM | POA: Diagnosis not present

## 2020-05-23 DIAGNOSIS — R2681 Unsteadiness on feet: Secondary | ICD-10-CM | POA: Diagnosis not present

## 2020-05-23 DIAGNOSIS — R1312 Dysphagia, oropharyngeal phase: Secondary | ICD-10-CM | POA: Diagnosis not present

## 2020-05-23 DIAGNOSIS — I69828 Other speech and language deficits following other cerebrovascular disease: Secondary | ICD-10-CM | POA: Diagnosis not present

## 2020-05-26 DIAGNOSIS — I69828 Other speech and language deficits following other cerebrovascular disease: Secondary | ICD-10-CM | POA: Diagnosis not present

## 2020-05-26 DIAGNOSIS — R2681 Unsteadiness on feet: Secondary | ICD-10-CM | POA: Diagnosis not present

## 2020-05-26 DIAGNOSIS — G9341 Metabolic encephalopathy: Secondary | ICD-10-CM | POA: Diagnosis not present

## 2020-05-26 DIAGNOSIS — M6281 Muscle weakness (generalized): Secondary | ICD-10-CM | POA: Diagnosis not present

## 2020-05-26 DIAGNOSIS — R1312 Dysphagia, oropharyngeal phase: Secondary | ICD-10-CM | POA: Diagnosis not present

## 2020-05-27 DIAGNOSIS — G9341 Metabolic encephalopathy: Secondary | ICD-10-CM | POA: Diagnosis not present

## 2020-05-27 DIAGNOSIS — I69828 Other speech and language deficits following other cerebrovascular disease: Secondary | ICD-10-CM | POA: Diagnosis not present

## 2020-05-27 DIAGNOSIS — R2681 Unsteadiness on feet: Secondary | ICD-10-CM | POA: Diagnosis not present

## 2020-05-27 DIAGNOSIS — M6281 Muscle weakness (generalized): Secondary | ICD-10-CM | POA: Diagnosis not present

## 2020-05-27 DIAGNOSIS — R1312 Dysphagia, oropharyngeal phase: Secondary | ICD-10-CM | POA: Diagnosis not present

## 2020-05-28 DIAGNOSIS — I69828 Other speech and language deficits following other cerebrovascular disease: Secondary | ICD-10-CM | POA: Diagnosis not present

## 2020-05-28 DIAGNOSIS — G9341 Metabolic encephalopathy: Secondary | ICD-10-CM | POA: Diagnosis not present

## 2020-05-28 DIAGNOSIS — M6281 Muscle weakness (generalized): Secondary | ICD-10-CM | POA: Diagnosis not present

## 2020-05-28 DIAGNOSIS — R1312 Dysphagia, oropharyngeal phase: Secondary | ICD-10-CM | POA: Diagnosis not present

## 2020-05-28 DIAGNOSIS — R2681 Unsteadiness on feet: Secondary | ICD-10-CM | POA: Diagnosis not present

## 2020-05-29 DIAGNOSIS — G9341 Metabolic encephalopathy: Secondary | ICD-10-CM | POA: Diagnosis not present

## 2020-05-29 DIAGNOSIS — I69828 Other speech and language deficits following other cerebrovascular disease: Secondary | ICD-10-CM | POA: Diagnosis not present

## 2020-05-29 DIAGNOSIS — R2681 Unsteadiness on feet: Secondary | ICD-10-CM | POA: Diagnosis not present

## 2020-05-29 DIAGNOSIS — M6281 Muscle weakness (generalized): Secondary | ICD-10-CM | POA: Diagnosis not present

## 2020-05-29 DIAGNOSIS — R1312 Dysphagia, oropharyngeal phase: Secondary | ICD-10-CM | POA: Diagnosis not present

## 2020-05-30 DIAGNOSIS — R2681 Unsteadiness on feet: Secondary | ICD-10-CM | POA: Diagnosis not present

## 2020-05-30 DIAGNOSIS — M6281 Muscle weakness (generalized): Secondary | ICD-10-CM | POA: Diagnosis not present

## 2020-05-30 DIAGNOSIS — G9341 Metabolic encephalopathy: Secondary | ICD-10-CM | POA: Diagnosis not present

## 2020-05-30 DIAGNOSIS — R1312 Dysphagia, oropharyngeal phase: Secondary | ICD-10-CM | POA: Diagnosis not present

## 2020-05-30 DIAGNOSIS — I69828 Other speech and language deficits following other cerebrovascular disease: Secondary | ICD-10-CM | POA: Diagnosis not present

## 2020-06-02 ENCOUNTER — Other Ambulatory Visit: Payer: Self-pay | Admitting: Family

## 2020-06-02 ENCOUNTER — Encounter: Payer: Self-pay | Admitting: Internal Medicine

## 2020-06-02 MED ORDER — ALPRAZOLAM 0.5 MG PO TABS: 0.2500 mg | ORAL_TABLET | Freq: Every day | ORAL | 0 refills | Status: DC

## 2020-06-03 ENCOUNTER — Telehealth: Payer: Self-pay

## 2020-06-03 NOTE — Telephone Encounter (Signed)
Anemia is worse  WBC is up 11.8 on 6/30 it was 9.7 put on prednisone  Per Dr. Mariea Clonts

## 2020-06-04 DIAGNOSIS — F411 Generalized anxiety disorder: Secondary | ICD-10-CM | POA: Diagnosis not present

## 2020-06-04 DIAGNOSIS — F5101 Primary insomnia: Secondary | ICD-10-CM | POA: Diagnosis not present

## 2020-06-04 DIAGNOSIS — F0390 Unspecified dementia without behavioral disturbance: Secondary | ICD-10-CM | POA: Diagnosis not present

## 2020-06-11 ENCOUNTER — Other Ambulatory Visit: Payer: Self-pay | Admitting: Internal Medicine

## 2020-06-11 ENCOUNTER — Other Ambulatory Visit: Payer: Self-pay | Admitting: Family

## 2020-06-11 DIAGNOSIS — M353 Polymyalgia rheumatica: Secondary | ICD-10-CM

## 2020-06-11 MED ORDER — TRAMADOL HCL 50 MG PO TABS: 50.0000 mg | ORAL_TABLET | Freq: Two times a day (BID) | ORAL | 0 refills | Status: DC

## 2020-06-25 ENCOUNTER — Encounter: Payer: Self-pay | Admitting: Family

## 2020-06-25 ENCOUNTER — Non-Acute Institutional Stay (SKILLED_NURSING_FACILITY): Payer: Medicare Other | Admitting: Family

## 2020-06-25 DIAGNOSIS — G2581 Restless legs syndrome: Secondary | ICD-10-CM | POA: Diagnosis not present

## 2020-06-25 DIAGNOSIS — I10 Essential (primary) hypertension: Secondary | ICD-10-CM

## 2020-06-25 DIAGNOSIS — F0280 Dementia in other diseases classified elsewhere without behavioral disturbance: Secondary | ICD-10-CM

## 2020-06-25 DIAGNOSIS — G4700 Insomnia, unspecified: Secondary | ICD-10-CM

## 2020-06-25 DIAGNOSIS — Z7901 Long term (current) use of anticoagulants: Secondary | ICD-10-CM

## 2020-06-25 DIAGNOSIS — G301 Alzheimer's disease with late onset: Secondary | ICD-10-CM

## 2020-06-25 DIAGNOSIS — F411 Generalized anxiety disorder: Secondary | ICD-10-CM | POA: Diagnosis not present

## 2020-06-25 DIAGNOSIS — M353 Polymyalgia rheumatica: Secondary | ICD-10-CM

## 2020-06-25 NOTE — Progress Notes (Signed)
Location:    [facility].   Nursing Home Room Number: 204-D Place of Service:  SNF (31) Provider:  Marlowe Sax, NP    Patient Care Team: [name], DO as PCP - General (Geriatric Medicine) Rehab, [place] ([place])  Extended Emergency Contact Information Primary Emergency Contact: Linney,[name] Address: [place], [place] [address] [name] of [place] Phone: [phone] Mobile Phone: [phone] Relation: Son Secondary Emergency Contact: [place] of [place] Phone: [phone] Relation: Daughter  Code Status:  DNR Goals of care: Advanced Directive information Advanced Directives 06/25/2020  Does Patient Have a Medical Advance Directive? Yes  Type of Paramedic of [place];Out of facility DNR (pink MOST or yellow form);Living will  Does patient want to make changes to medical advance directive? No - Patient declined  Copy of [place] in Chart? Yes - validated most recent copy scanned in chart (See row information)  Pre-existing out of facility DNR order (yellow form or pink MOST form) -     Chief Complaint  Patient presents with  . Medical Management of Chronic Issues    Routine Visit.  . Immunizations    Discuss the need for Influenza Vaccine.    HPI:  Pt is a 86 y.o. female seen today for medical management of chronic diseases.she has a medical history of Hypertension,CAD with unstable angina,PE/VTE,Hyperlipidemia,CKD stage 3,OAB,Polymylagia Rheumatica , RLS,dysphagia,on oxygen via nasal canula for chronic shortness of breath among other condition.she is seen up in bed watching TV.She denies any acute issues today.Has been working with restorative Therapy 5 x per week for AROM for both upper and lower extremities  Her recent INR was 2.6 on coumadin 2.5 mg tablet daily INR in 4 weeks was ordered by Dr.Reed on  06/20/2020. Her weight log reviewed 10 lbs weight gain noted over 7 days though weight inconsistency noted:  Weight 144.2 lbs ( 06/02/2020 )  Weight 134.2 lbs ( 06/13/2020 )  Weight 144.2 lbs ( 06/19/2020 )  She denies any cough,edema or worsening shortness of breath. B/p log readings in the 120's/70's - 140's/60, and HR in the 60's-80's.    Past Medical History:  Diagnosis Date  . Anemia 03/02/2012  . Angina pectoris, unstable ([place]) 03/02/2012  . Anxiety   . Axillary abscess   . Bradycardia 06/13/2012  . CAD (coronary artery disease)    S/p PTCA / stenting (last cath 2004, multiple LAD stents, 2 stents in the right coronary artery all patent)  . Cancer ([place])   . Complication of anesthesia   . Compression fracture of L2 ([place]) 03/12/2017  . Dementia ([place])   . Dysphagia 10/06/2012  . Dyspnea 02/03/2012  . GERD (gastroesophageal reflux disease)   . History of pulmonary embolus (PE) 03/19/2017  . HTN (hypertension)   . PMR (polymyalgia rheumatica) (HCC)   . PONV (postoperative nausea and vomiting)   . Restless leg syndrome   . 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 Started coumadin  . Weakness of both legs 06/09/2012   Past Surgical History:  Procedure Laterality Date  . BREAST LUMPECTOMY    . COLECTOMY    . ESOPHAGOGASTRODUODENOSCOPY (EGD) WITH ESOPHAGEAL DILATION  10/10/2012   Procedure: ESOPHAGOGASTRODUODENOSCOPY (EGD) WITH ESOPHAGEAL DILATION;  Surgeon: Inda Castle, MD;  Location: [facility];  Service: Endoscopy;  Laterality: N/A;  . FEMUR IM NAIL Left 12/13/2018  Procedure: INTRAMEDULLARY (IM) NAIL FEMORAL LEFT;  Surgeon: Paralee Cancel, MD;  Location: [facility];  Service: Orthopedics;  Laterality: Left;  . heart stents  x 8  . INCISION AND DRAINAGE     bilateral axillary, non specific staff  . INCISION AND DRAINAGE ABSCESS  09/28/2012   Procedure: INCISION AND DRAINAGE ABSCESS;  Surgeon: Zenovia Jarred, MD;  Location: [facility];  Service: General;  Laterality: Bilateral;    . stent     cardiac x 8 stents.    Allergies  Allergen Reactions  . Ambien [Zolpidem Tartrate] Nausea And Vomiting  . Codeine   . Codeine Phosphate Nausea And Vomiting  . Darvocet [Propoxyphene N-Acetaminophen] Nausea And Vomiting  . Fish Allergy   . Fish-Derived Products   . Hydrocodone   . Promethazine   . Promethazine Hcl Nausea And Vomiting  . Sulfamethoxazole Other (See Comments)    Pt doesn't remember reaction  . Vicodin [Hydrocodone-Acetaminophen] Other (See Comments)    Unknown reaction  . Amoxicillin Nausea And Vomiting and Rash  . Penicillins Nausea And Vomiting and Rash    Allergies as of 06/25/2020      Reactions   Ambien [zolpidem Tartrate] Nausea And Vomiting   Codeine    Codeine Phosphate Nausea And Vomiting   Darvocet [propoxyphene N-acetaminophen] Nausea And Vomiting   Fish Allergy    Fish-derived Products    Hydrocodone    Promethazine    Promethazine Hcl Nausea And Vomiting   Sulfamethoxazole Other (See Comments)   Pt doesn't remember reaction   Vicodin [hydrocodone-acetaminophen] Other (See Comments)   Unknown reaction   Amoxicillin Nausea And Vomiting, Rash   Penicillins Nausea And Vomiting, Rash      Medication List       Accurate as of June 25, 2020 12:36 PM. If you have any questions, ask your nurse or doctor.        STOP taking these medications   atorvastatin 10 MG tablet Commonly known as: LIPITOR Stopped by: Sandrea Hughs, NP     TAKE these medications   acetaminophen 325 MG tablet Commonly known as: TYLENOL Take 650 mg by mouth every 6 (six) hours as needed for mild pain, moderate pain or fever.   acetaminophen 325 MG tablet Commonly known as: TYLENOL Take 650 mg by mouth every 6 (six) hours. Take routinely for left hip pain   ALPRAZolam 0.5 MG tablet Commonly known as: XANAX Take 0.5 tablets (0.25 mg total) by mouth daily.   amLODipine 10 MG tablet Commonly known as: NORVASC Take 10 mg by mouth every morning.    donepezil 10 MG tablet Commonly known as: ARICEPT Take 10 mg by mouth at bedtime.   Doxepin HCl 3 MG Tabs Take 3 mg by mouth at bedtime. What changed: Another medication with the same name was removed. Continue taking this medication, and follow the directions you see here. Changed by: Sandrea Hughs, NP   losartan 50 MG tablet Commonly known as: COZAAR Take 50 mg by mouth at bedtime.   nitroGLYCERIN 0.4 MG SL tablet Commonly known as: NITROSTAT Place 0.4 mg under the tongue every 5 (five) minutes as needed for chest pain.   NON FORMULARY Diet Order: Mech Soft Liberalized Diet.   NUTRITIONAL SUPPLEMENT PO Take 1 each by mouth 2 (two) times a day. Magic Cup - take with lunch and dinner   ondansetron 4 MG tablet Commonly known as: ZOFRAN Take 4 mg by mouth every 4 (four) hours as needed for nausea.  polyethylene glycol 17 g packet Commonly known as: MIRALAX / GLYCOLAX Take 17 g by mouth daily as needed for moderate constipation.   predniSONE 1 MG tablet Commonly known as: DELTASONE Take 1 mg by mouth daily with breakfast.   ProAir HFA 108 (90 Base) MCG/ACT inhaler Generic drug: albuterol Inhale 2 puffs into the lungs every 6 (six) hours as needed for wheezing or shortness of breath.   rOPINIRole 1 MG tablet Commonly known as: REQUIP 1mg  at noon, 1mg  2 hours before bed, then 1mg  at bedtime   senna-docusate 8.6-50 MG tablet Commonly known as: Senokot-S Take 1 tablet by mouth 2 (two) times daily.   traMADol 50 MG tablet Commonly known as: ULTRAM Take 1 tablet (50 mg total) by mouth 2 (two) times daily.   Tums 500 MG chewable tablet Generic drug: calcium carbonate Chew 1 tablet by mouth 3 (three) times daily as needed for indigestion or heartburn.   Vitamin D3 1.25 MG (50000 UT) Caps Take 1 capsule by mouth once a week.   warfarin 2 MG tablet Commonly known as: COUMADIN Take 2 mg by mouth daily.       Review of Systems  Constitutional: Negative for  appetite change, chills, fatigue and fever.  HENT: Positive for postnasal drip. Negative for congestion, rhinorrhea, sinus pressure, sinus pain, sneezing and sore throat.   Eyes: Negative for discharge, redness and itching.  Respiratory: Negative for cough, chest tightness and wheezing.         oxygen via nasal canula for chronic shortness of breath   Cardiovascular: Negative for chest pain, palpitations and leg swelling.  Gastrointestinal: Negative for abdominal distention, abdominal pain, constipation, diarrhea, nausea and vomiting.  Endocrine: Negative for cold intolerance, heat intolerance, polydipsia, polyphagia and polyuria.  Genitourinary: Negative for difficulty urinating, dysuria and urgency.       Incontinent   Musculoskeletal: Positive for arthralgias and gait problem. Negative for joint swelling and myalgias.  Skin: Negative for color change, pallor and rash.  Neurological: Negative for dizziness, speech difficulty, light-headedness, numbness and headaches.  Hematological: Does not bruise/bleed easily.  Psychiatric/Behavioral: Negative for agitation, behavioral problems and sleep disturbance. The patient is not nervous/anxious.     Immunization History  Administered Date(s) Administered  . Influenza-Unspecified 08/01/2016, 08/11/2017, 07/02/2018, 08/02/2019  . Moderna SARS-COVID-2 Vaccination 11/01/2019, 11/29/2019  . PPD Test 03/18/2017  . Pneumococcal Conjugate-13 09/17/2017  . Pneumococcal Polysaccharide-23 04/17/2019   Pertinent  Health Maintenance Due  Topic Date Due  . INFLUENZA VACCINE  06/01/2020  . DEXA SCAN  Completed  . PNA vac Low Risk Adult  Completed   Fall Risk  03/14/2018  Falls in the past year? No    Vitals:   06/25/20 1223  BP: 136/63  Pulse: 76  Resp: 17  Temp: (!) 97.4 F (36.3 C)  SpO2: 98%  Weight: 144 lb 6.4 oz (65.5 kg)  Height: 5\' 1"  (1.549 m)   Body mass index is 27.28 kg/m. Physical Exam Vitals and nursing note reviewed.    Constitutional:      General: She is not in acute distress.    Appearance: She is overweight. She is not ill-appearing.  HENT:     Head: Normocephalic.     Nose: Nose normal. No congestion or rhinorrhea.     Mouth/Throat:     Mouth: Mucous membranes are moist.     Pharynx: Oropharynx is clear. No oropharyngeal exudate or posterior oropharyngeal erythema.  Eyes:     General: No scleral icterus.  Right eye: No discharge.        Left eye: No discharge.     Extraocular Movements: Extraocular movements intact.     Conjunctiva/sclera: Conjunctivae normal.     Pupils: Pupils are equal, round, and reactive to light.  Cardiovascular:     Rate and Rhythm: Normal rate and regular rhythm.     Pulses: Normal pulses.     Heart sounds: Normal heart sounds. No murmur heard.  No friction rub. No gallop.   Pulmonary:     Effort: Pulmonary effort is normal. No respiratory distress.     Breath sounds: Normal breath sounds. No wheezing, rhonchi or rales.  Chest:     Chest wall: No tenderness.  Abdominal:     General: Bowel sounds are normal. There is no distension.     Palpations: Abdomen is soft. There is no mass.     Tenderness: There is no abdominal tenderness. There is no guarding or rebound.  Musculoskeletal:        General: No swelling or tenderness.     Cervical back: Normal range of motion. No rigidity or tenderness.     Right lower leg: No edema.     Left lower leg: No edema.     Comments: Moves x 4 extremities requires assistance with transfer Arthritic changes on fingers   Lymphadenopathy:     Cervical: No cervical adenopathy.  Skin:    General: Skin is warm.     Coloration: Skin is not pale.     Findings: No bruising, erythema or rash.  Neurological:     Mental Status: She is alert. Mental status is at baseline.     Cranial Nerves: No cranial nerve deficit.     Sensory: No sensory deficit.     Gait: Gait abnormal.  Psychiatric:        Mood and Affect: Mood normal.         Speech: Speech normal.        Behavior: Behavior normal.        Thought Content: Thought content normal.        Judgment: Judgment normal.     Labs reviewed: Recent Labs    11/26/19 0000 12/03/19 0000 05/06/20 0000  NA 136* 143 137  K 5.3 3.7 4.7  CL 103 109* 104  CO2 18 18 21   BUN 23* 15 27*  CREATININE 1.0 0.8 0.9  CALCIUM 8.8 9.1 8.9    Recent Labs    11/23/19 0000 11/23/19 0000 11/26/19 0000 11/26/19 0000 12/03/19 0000 04/10/20 0000 05/06/20 0000  WBC 18.2   < > 14.7   < > 8.9 9.0 11.8  NEUTROABS 18  --  12  --  7  --   --   HGB 11.1*   < > 9.4*   < > 11.6* 9.7* 8.6*  HCT 35*   < > 29*   < > 36 31* 28*  PLT 312   < > 224   < > 461* 315 291   < > = values in this interval not displayed.   Lab Results  Component Value Date   TSH 1.71 08/01/2018   Lab Results  Component Value Date   HGBA1C 5.7 08/01/2018   Lab Results  Component Value Date   CHOL 140 08/01/2018   HDL 46 08/01/2018   LDLCALC 80 08/01/2018   TRIG 82 08/01/2018   CHOLHDL 2.4 03/03/2012    Significant Diagnostic Results in last 30 days:  No results found.  Assessment/Plan 1. Essential hypertension B/p reviewed at goal. - continue on losartan and Amlodipine   2. PMR (polymyalgia rheumatica) (HCC) - continue on prednisone 1 mg tablet with breakfast.   3. Restless leg syndrome Continue on ropinirole 1 mg tablet.  4. Generalized anxiety disorder Stable on alprazolam   5. Insomnia, unspecified type Continue on Doxepin   6. Chronic anticoagulation Latest INR 2.6 on coumadin 2.5 mg tablet daily.orders in place to recheck INR in 4 weeks ordered 06/20/2020   7. Late onset Alzheimer's disease without behavioral disturbance ([place]) No new behavioral issues reported. - continue on Aricept and supportive care  Family/ staff Communication: Reviewed plan of care with patient and facility Nurse  Labs/tests ordered:  None

## 2020-07-02 DIAGNOSIS — R05 Cough: Secondary | ICD-10-CM | POA: Diagnosis not present

## 2020-07-02 DIAGNOSIS — R0902 Hypoxemia: Secondary | ICD-10-CM | POA: Diagnosis not present

## 2020-07-02 IMAGING — CT CT CERVICAL SPINE W/O CM
3 of 5 series · 15 of 33 positions shown, 17 images · non-contrast
Comparison: 11/14/15

CLINICAL DATA: Recent fall

EXAM:
CT HEAD WITHOUT CONTRAST
CT CERVICAL SPINE WITHOUT CONTRAST
TECHNIQUE: Multidetector CT imaging of the head and cervical spine was
performed following the standard protocol without intravenous
contrast. Multiplanar CT image reconstructions of the cervical spine
were also generated.

[Series 6: coronal soft tissue · coronal · 0.30mm/px · 3 of 64 slices shown]
[im 13/64  bone]
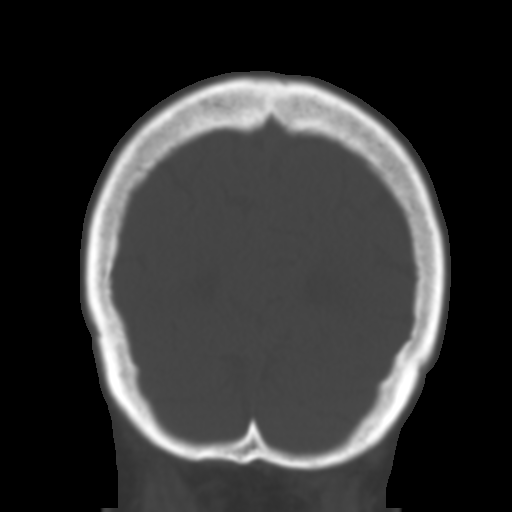
[im 26/64  bone]
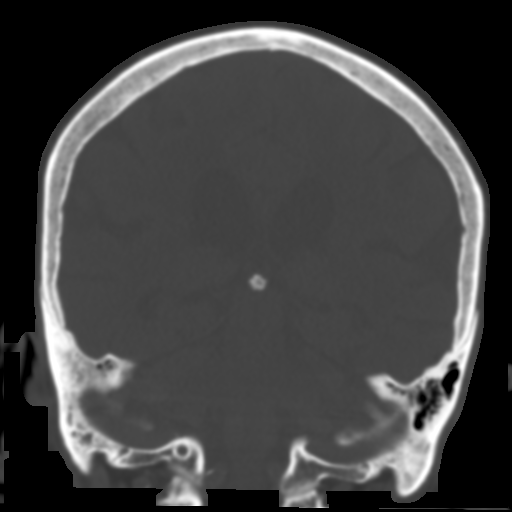
[im 38/64  bone]
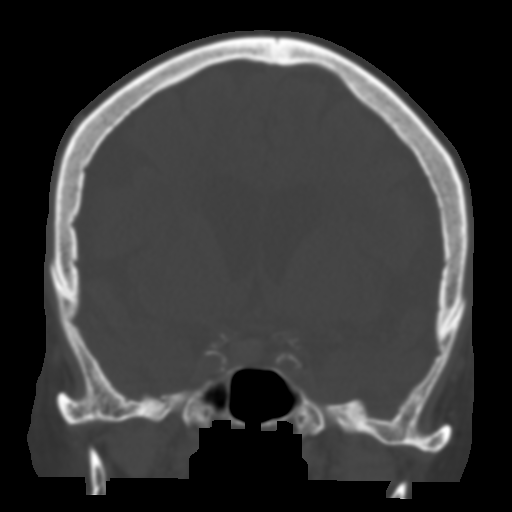

[Series 9: c spine soft · axial · 0.30mm/px · z∈[+1471,+1583]mm · 8 of 73 slices shown, 10 images]
[im 9/73  soft-tissue]
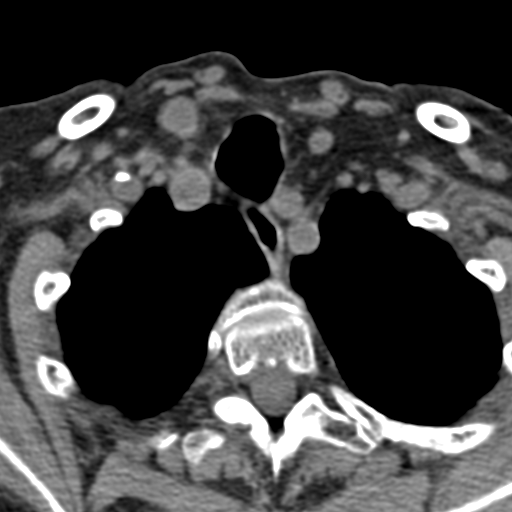
[im 9/73  bone]
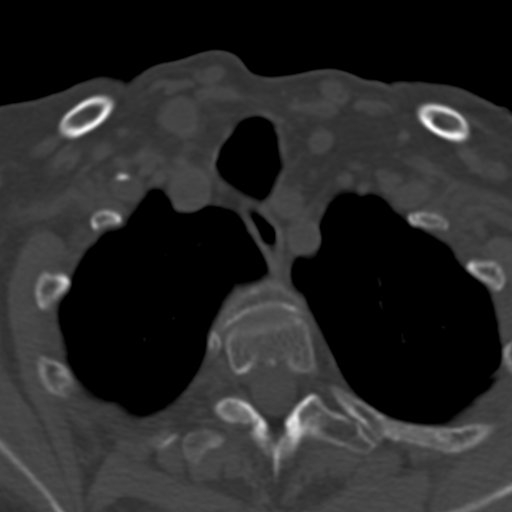
[im 17/73  bone]
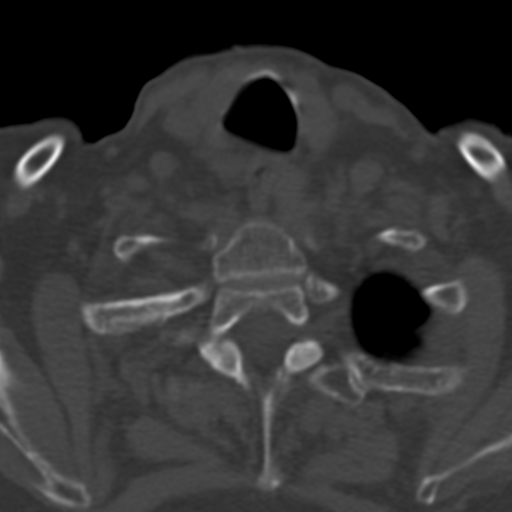
[im 25/73  bone]
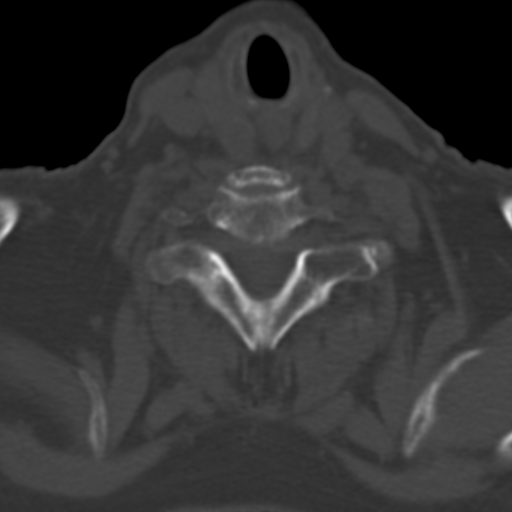
[im 33/73  bone]
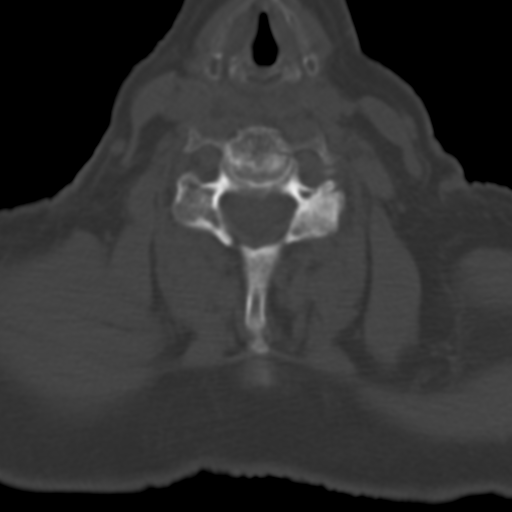
[im 41/73  soft-tissue]
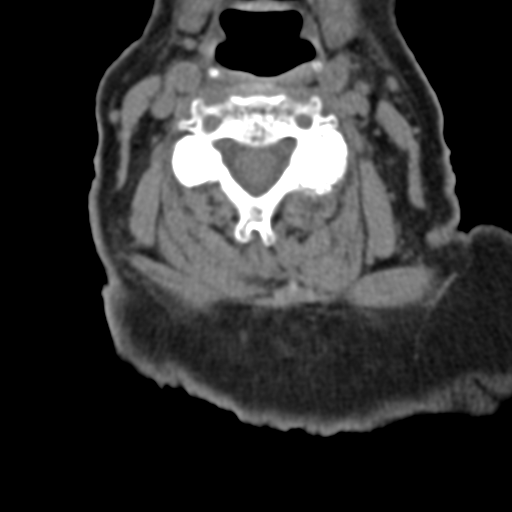
[im 41/73  bone]
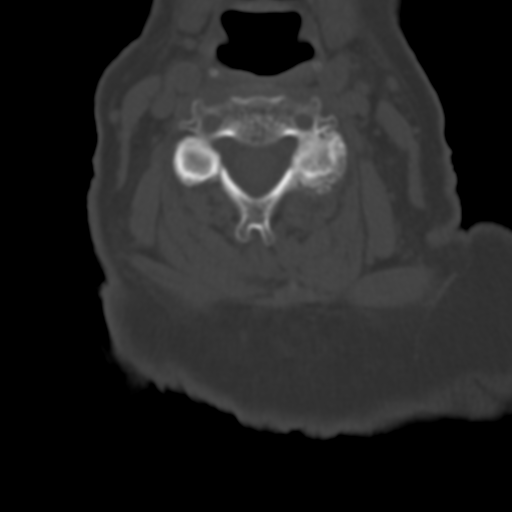
[im 49/73  bone]
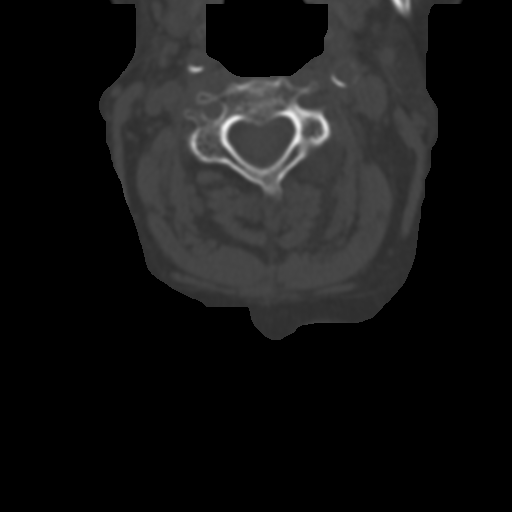
[im 57/73  bone]
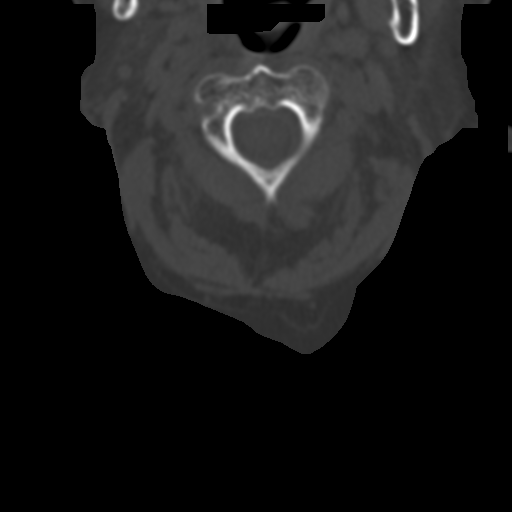
[im 65/73  bone]
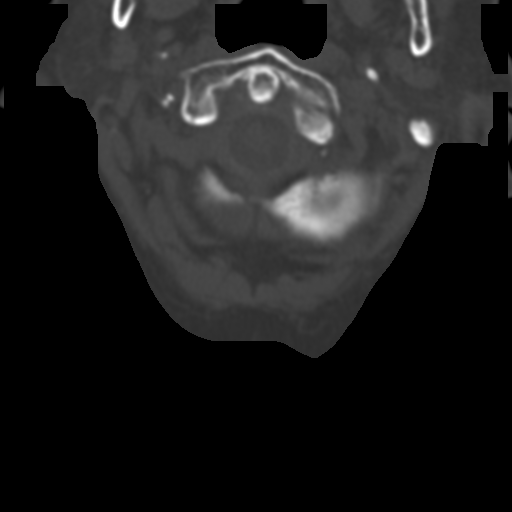

[Series 13: sagittal bone · sagittal · 0.29mm/px · 4 of 49 slices shown]
[im 10/49  bone]
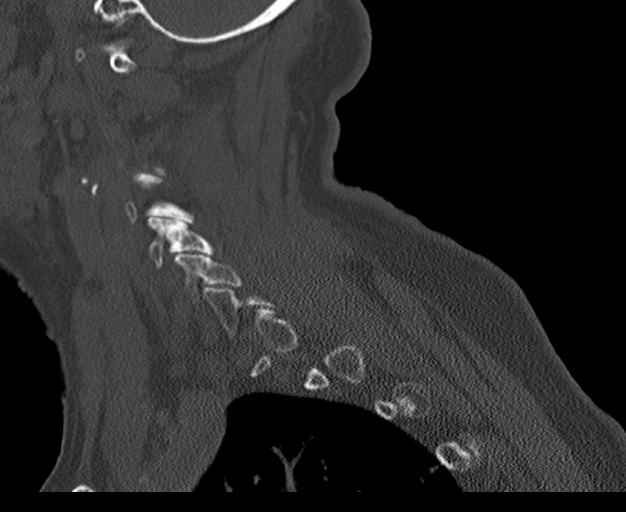
[im 20/49  bone]
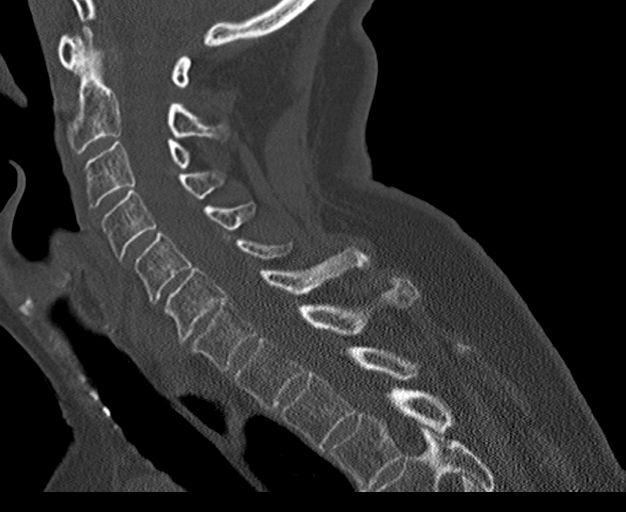
[im 29/49  bone]
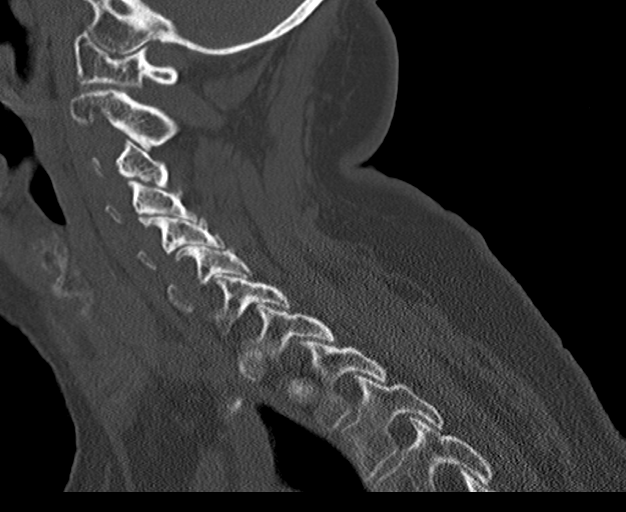
[im 39/49  bone]
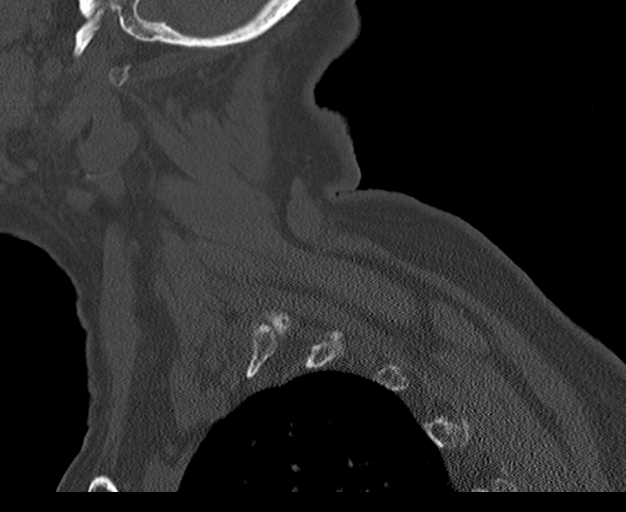

[15 of 33 positions shown; findings below may reference images not displayed]

FINDINGS: CT HEAD FINDINGS

Brain: Chronic white matter ischemic changes are noted. No findings
to suggest acute hemorrhage, acute infarction or space-occupying
mass lesion are noted. Dense lesion is again identified within the
sella stable from the prior exam.

Vascular: No hyperdense vessel or unexpected calcification.

Skull: Normal. Negative for fracture or focal lesion.

Sinuses/Orbits: No acute finding.

Other: None

CT CERVICAL SPINE FINDINGS

Alignment: Within normal limits.

Skull base and vertebrae: 7 cervical segments are well visualized.
Vertebral body height is well maintained. No acute fracture or acute
facet abnormality is noted. Facet hypertrophic changes are seen. No
significant neural foraminal narrowing is noted.

Soft tissues and spinal canal: Surrounding soft tissues are
unremarkable.

Upper chest: Visualized upper lung fields are within normal limits.

Other: None
IMPRESSION: CT of the head: No change from the prior exam. No acute abnormality
noted.

CT of the cervical spine: Multilevel degenerative change without
acute abnormality.

## 2020-07-04 IMAGING — RF DG FEMUR 2+V*R*
1 series · 4 of 4 positions shown · non-contrast
Comparison: Radiograph December 11, 2018.

CLINICAL DATA: Status post surgical fixation of proximal left
femoral fracture.

EXAM:
RIGHT FEMUR 2 VIEWS
Radiation exposure index: 6.5328 mGy.

[Series 1: unknown protocol · 0.14mm/px · 4 of 4 slices shown]
[im 1/4]
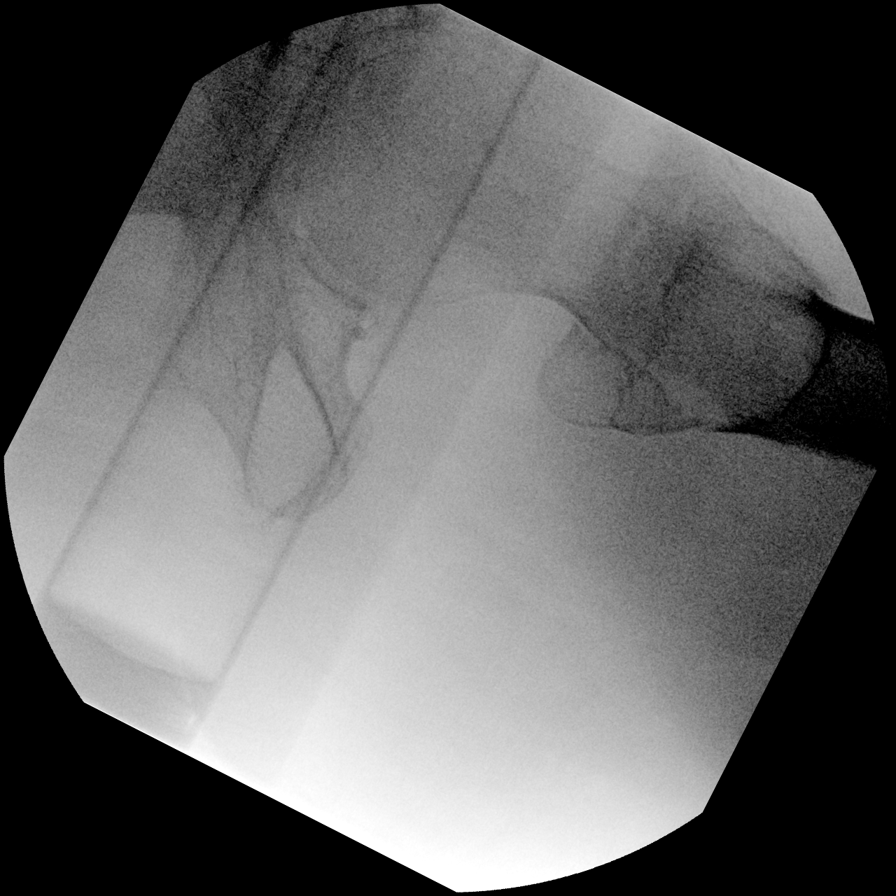
[im 2/4]
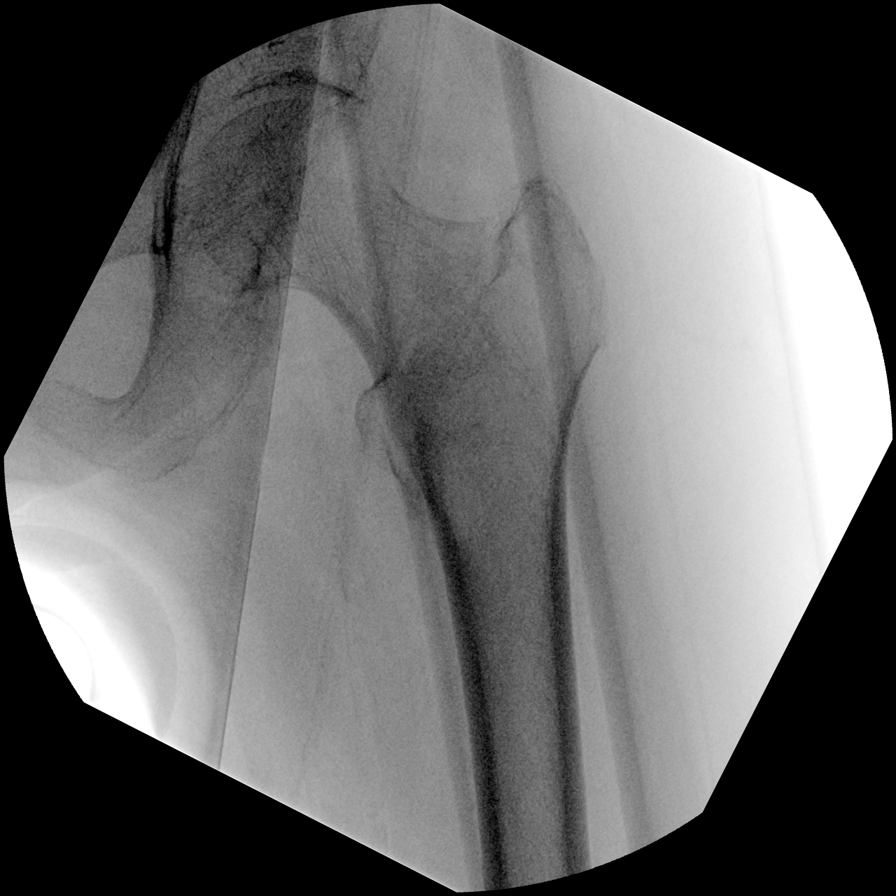
[im 3/4]
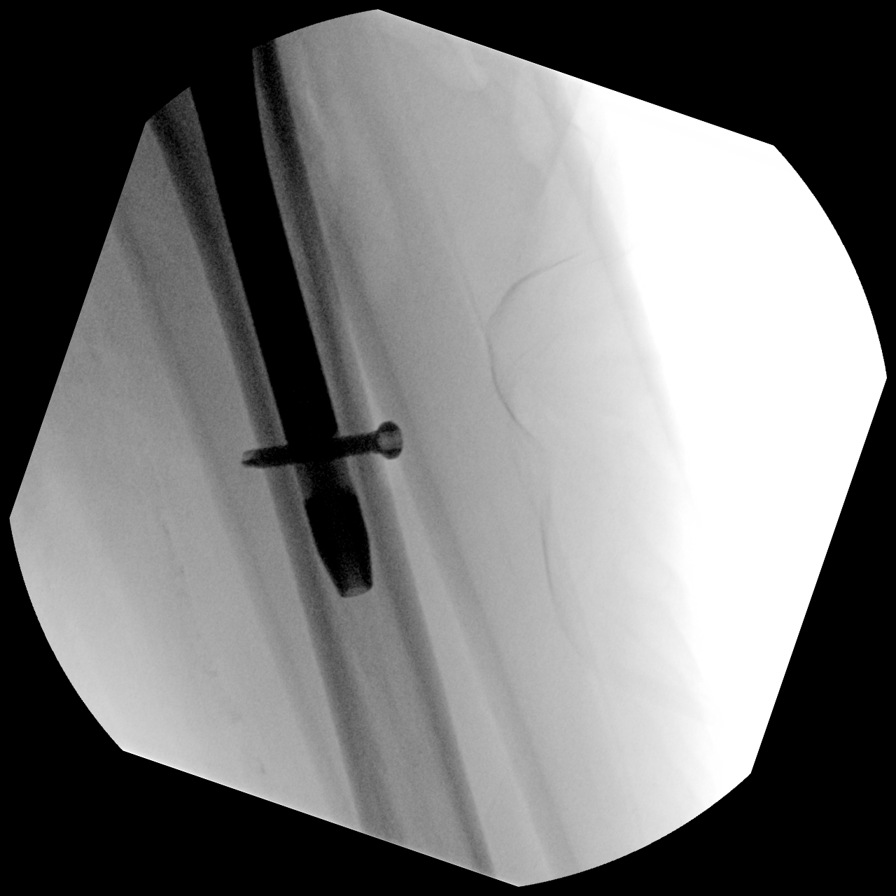
[im 4/4]
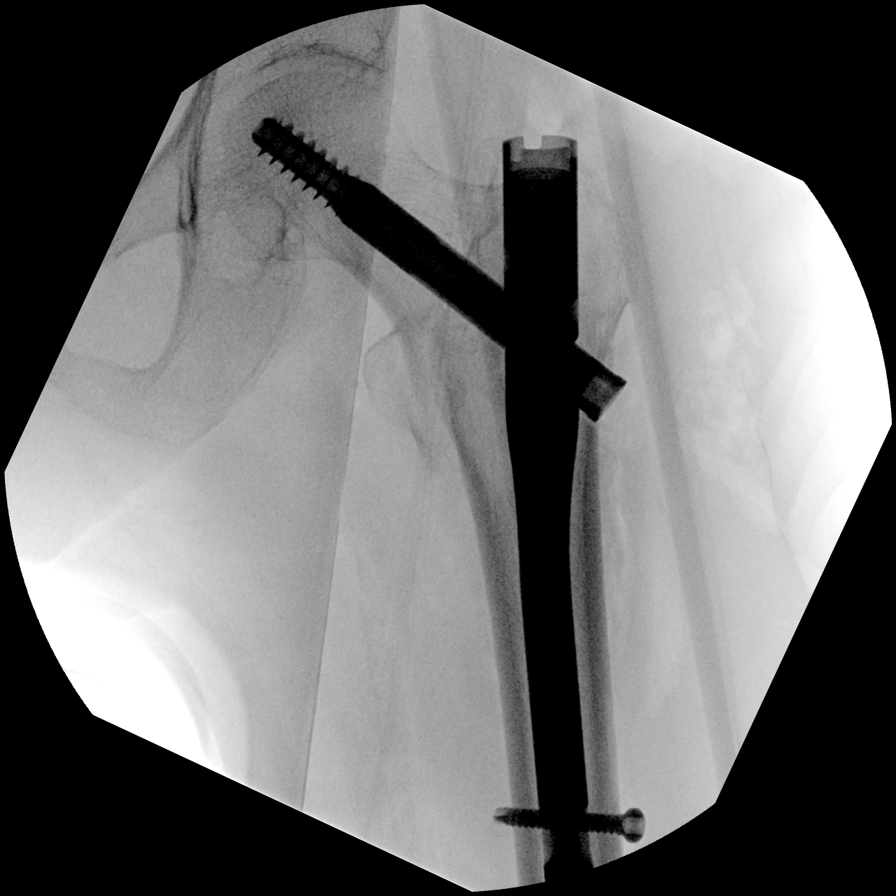

[4 of 4 positions shown; findings below may reference images not displayed]

FINDINGS: Four intraoperative fluoroscopic images demonstrate surgical
internal fixation of proximal left femoral inter trochanteric
fracture. Good alignment of fracture components is noted.
IMPRESSION: Status post surgical internal fixation of proximal left femoral
intertrochanteric fracture.

## 2020-07-08 ENCOUNTER — Encounter: Payer: Self-pay | Admitting: Internal Medicine

## 2020-07-08 ENCOUNTER — Non-Acute Institutional Stay (SKILLED_NURSING_FACILITY): Payer: Medicare Other | Admitting: Internal Medicine

## 2020-07-08 DIAGNOSIS — E559 Vitamin D deficiency, unspecified: Secondary | ICD-10-CM | POA: Diagnosis not present

## 2020-07-08 DIAGNOSIS — E119 Type 2 diabetes mellitus without complications: Secondary | ICD-10-CM | POA: Diagnosis not present

## 2020-07-08 DIAGNOSIS — J690 Pneumonitis due to inhalation of food and vomit: Secondary | ICD-10-CM | POA: Diagnosis not present

## 2020-07-08 DIAGNOSIS — R0902 Hypoxemia: Secondary | ICD-10-CM | POA: Diagnosis not present

## 2020-07-08 DIAGNOSIS — R05 Cough: Secondary | ICD-10-CM | POA: Diagnosis not present

## 2020-07-08 DIAGNOSIS — I10 Essential (primary) hypertension: Secondary | ICD-10-CM | POA: Diagnosis not present

## 2020-07-08 DIAGNOSIS — E039 Hypothyroidism, unspecified: Secondary | ICD-10-CM | POA: Diagnosis not present

## 2020-07-08 LAB — BASIC METABOLIC PANEL
BUN: 18 (ref 4–21)
CO2: 22 (ref 13–22)
Chloride: 98 — AB (ref 99–108)
Creatinine: 0.9 (ref 0.5–1.1)
Glucose: 106
Potassium: 4.5 (ref 3.4–5.3)
Sodium: 133 — AB (ref 137–147)

## 2020-07-08 LAB — CBC AND DIFFERENTIAL
HCT: 34 — AB (ref 36–46)
Hemoglobin: 10.3 — AB (ref 12.0–16.0)
Platelets: 351 (ref 150–399)
WBC: 8.1

## 2020-07-08 LAB — COMPREHENSIVE METABOLIC PANEL
Calcium: 9.2 (ref 8.7–10.7)
GFR calc Af Amer: 59.14
GFR calc non Af Amer: 51.02

## 2020-07-08 LAB — CBC: RBC: 4.28 (ref 3.87–5.11)

## 2020-07-08 NOTE — Progress Notes (Signed)
Location:  [facility] of Service:  SNF [id number]) Provider:  Shawanna Zanders L. Mariea Clonts, D.O., C.M.D.  [name], DO  Patient Care Team: [name], DO as PCP - General (Geriatric Medicine) Rehab, [facility] ([facility])  Extended Emergency Contact Information Primary Emergency Contact: Cornia,[name] Address: [address]          [place], [place] [address] [name] of [place] Phone: [phone] Mobile Phone: [phone] Relation: Son Secondary Emergency Contact: [place] of [place] Phone: [phone] Relation: Daughter  Code Status:  DNR Goals of care: Advanced Directive information Advanced Directives 06/25/2020  Does Patient Have a Medical Advance Directive? Yes  Type of Paramedic of [place];Out of facility DNR (pink MOST or yellow form);Living will  Does patient want to make changes to medical advance directive? No - Patient declined  Copy of [place] in Chart? Yes - validated most recent copy scanned in chart (See row information)  Pre-existing out of facility DNR order (yellow form or pink MOST form) -     Chief Complaint  Patient presents with  . Acute Visit    cough, congestion, hypoxia    HPI:  Pt is a 86 y.o. female seen today for an acute visit for cough, congestion, hypoxia.  She is 86 yo with dementia, CAD, prior hip fx, frailty, CKD, PMR, and failure to thrive.  Goals are comfort-based.  Nursing noted pt was wheezing this am and she was given her albuterol.  She has had a wet cough, sats just 92% on 2L initially and dropped to 89% later on when vitals rechecked.  When seen, she reported not feeling well, but could not otherwise explain.  She appeared dyspneic and I put the head of her bed up a little.  Appetite was poor.      Past Medical History:  Diagnosis Date  . Anemia 03/02/2012  . Angina pectoris, unstable ([facility])  03/02/2012  . Anxiety   . Axillary abscess   . Bradycardia 06/13/2012  . CAD (coronary artery disease)    S/p PTCA / stenting (last cath 2004, multiple LAD stents, 2 stents in the right coronary artery all patent)  . Cancer ([facility])   . Complication of anesthesia   . Compression fracture of L2 ([facility]) 03/12/2017  . Dementia ([facility])   . Dysphagia 10/06/2012  . Dyspnea 02/03/2012  . GERD (gastroesophageal reflux disease)   . History of pulmonary embolus (PE) 03/19/2017  . HTN (hypertension)   . PMR (polymyalgia rheumatica) (HCC)   . PONV (postoperative nausea and vomiting)   . Restless leg syndrome   . 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 Started coumadin  . Weakness of both legs 06/09/2012   Past Surgical History:  Procedure Laterality Date  . BREAST LUMPECTOMY    . COLECTOMY    . ESOPHAGOGASTRODUODENOSCOPY (EGD) WITH ESOPHAGEAL DILATION  10/10/2012   Procedure: ESOPHAGOGASTRODUODENOSCOPY (EGD) WITH ESOPHAGEAL DILATION;  Surgeon: Inda Castle, MD;  Location: [facility];  Service: Endoscopy;  Laterality: N/A;  . FEMUR IM NAIL Left 12/13/2018   Procedure: INTRAMEDULLARY (IM) NAIL FEMORAL LEFT;  Surgeon: Paralee Cancel, MD;  [facility];  Service: Orthopedics;  Laterality: Left;  . heart stents  x 8  . INCISION AND DRAINAGE     bilateral axillary, non specific staff  . INCISION AND DRAINAGE ABSCESS  09/28/2012   Procedure: INCISION AND DRAINAGE ABSCESS;  Surgeon: Zenovia Jarred, MD;  Location: [facility];  Service: General;  Laterality: Bilateral;  . stent     cardiac x 8 stents.    Allergies  Allergen Reactions  . Ambien [Zolpidem Tartrate] Nausea And Vomiting  . Codeine   . Codeine Phosphate Nausea And Vomiting  . Darvocet [Propoxyphene N-Acetaminophen] Nausea And Vomiting  . Fish Allergy   . Fish-Derived Products   . Hydrocodone   . Promethazine   . Promethazine Hcl Nausea And Vomiting  . Sulfamethoxazole Other (See Comments)    Pt doesn't remember reaction  .  Vicodin [Hydrocodone-Acetaminophen] Other (See Comments)    Unknown reaction  . Amoxicillin Nausea And Vomiting and Rash  . Penicillins Nausea And Vomiting and Rash    Outpatient Encounter Medications as of 07/08/2020  Medication Sig  . acetaminophen (TYLENOL) 325 MG tablet Take 650 mg by mouth every 6 (six) hours as needed for mild pain, moderate pain or fever.  Marland Kitchen acetaminophen (TYLENOL) 325 MG tablet Take 650 mg by mouth every 6 (six) hours. Take routinely for left hip pain  . albuterol (PROAIR HFA) 108 (90 Base) MCG/ACT inhaler Inhale 2 puffs into the lungs every 6 (six) hours as needed for wheezing or shortness of breath.  . ALPRAZolam (XANAX) 0.5 MG tablet Take 0.5 tablets (0.25 mg total) by mouth daily.  Marland Kitchen amLODipine (NORVASC) 10 MG tablet Take 10 mg by mouth every morning.   . calcium carbonate (TUMS) 500 MG chewable tablet Chew 1 tablet by mouth 3 (three) times daily as needed for indigestion or heartburn.  . Cholecalciferol (VITAMIN D3) 1.25 MG (50000 UT) CAPS Take 1 capsule by mouth once a week.  . donepezil (ARICEPT) 10 MG tablet Take 10 mg by mouth at bedtime.   . Doxepin HCl 3 MG TABS Take 3 mg by mouth at bedtime.  Marland Kitchen losartan (COZAAR) 50 MG tablet Take 50 mg by mouth at bedtime.   . nitroGLYCERIN (NITROSTAT) 0.4 MG SL tablet Place 0.4 mg under the tongue every 5 (five) minutes as needed for chest pain.  . NON FORMULARY Diet Order: Mech Soft Liberalized Diet.  . Nutritional Supplements (NUTRITIONAL SUPPLEMENT PO) Take 1 each by mouth 2 (two) times a day. Magic Cup - take with lunch and dinner  . ondansetron (ZOFRAN) 4 MG tablet Take 4 mg by mouth every 4 (four) hours as needed for nausea.   . polyethylene glycol (MIRALAX / GLYCOLAX) packet Take 17 g by mouth daily as needed for moderate constipation.  . predniSONE (DELTASONE) 1 MG tablet Take 1 mg by mouth daily with breakfast.   . rOPINIRole (REQUIP) 1 MG tablet 1mg  at noon, 1mg  2 hours before bed, then 1mg  at bedtime  .  senna-docusate (SENOKOT-S) 8.6-50 MG tablet Take 1 tablet by mouth 2 (two) times daily.  Marland Kitchen warfarin (COUMADIN) 2 MG tablet Take 2 mg by mouth daily.   . [DISCONTINUED] traMADol (ULTRAM) 50 MG tablet Take 1 tablet (50 mg total) by mouth 2 (two) times daily.   No facility-administered encounter medications on file as of 07/08/2020.    Review of Systems  Constitutional: Positive for malaise/fatigue. Negative for chills and fever.  HENT: Positive for congestion. Negative for sore throat.   Respiratory: Positive for cough, shortness of breath and wheezing. Negative for sputum production.   Cardiovascular: Negative for chest pain and palpitations.  Gastrointestinal: Negative for abdominal pain, diarrhea, nausea and vomiting.  Genitourinary: Negative for dysuria.  Musculoskeletal: Positive for joint pain and myalgias. Negative for  falls.       Chronic  Neurological: Positive for weakness. Negative for dizziness and loss of consciousness.  Psychiatric/Behavioral: Positive for memory loss.    Immunization History  Administered Date(s) Administered  . Influenza-Unspecified 08/01/2016, 08/11/2017, 07/02/2018, 08/02/2019  . Moderna SARS-COVID-2 Vaccination 11/01/2019, 11/29/2019  . PPD Test 03/18/2017  . Pneumococcal Conjugate-13 09/17/2017  . Pneumococcal Polysaccharide-23 04/17/2019   Pertinent  Health Maintenance Due  Topic Date Due  . INFLUENZA VACCINE  06/01/2020  . DEXA SCAN  Completed  . PNA vac Low Risk Adult  Completed   Fall Risk  03/14/2018  Falls in the past year? No   Functional Status Survey:    There were no vitals filed for this visit. There is no height or weight on file to calculate BMI. Physical Exam Vitals reviewed.  Constitutional:      Comments: dyspneic  HENT:     Head: Normocephalic and atraumatic.  Cardiovascular:     Rate and Rhythm: Normal rate and regular rhythm.     Pulses: Normal pulses.     Heart sounds: Normal heart sounds.     Comments: No edema  of ankles or sacrum Pulmonary:     Breath sounds: Rhonchi (wet-sounding rhonchi throughout both lung fields) present.  Abdominal:     General: Bowel sounds are normal.     Palpations: Abdomen is soft.     Tenderness: There is no abdominal tenderness.  Musculoskeletal:     Right lower leg: No edema.     Left lower leg: No edema.  Skin:    General: Skin is warm and dry.     Coloration: Skin is pale.  Neurological:     Mental Status: She is alert. Mental status is at baseline.     Motor: Weakness present.  Psychiatric:        Mood and Affect: Mood normal.     Labs reviewed: Recent Labs    11/26/19 0000 12/03/19 0000 05/06/20 0000  NA 136* 143 137  K 5.3 3.7 4.7  CL 103 109* 104  CO2 18 18 21   BUN 23* 15 27*  CREATININE 1.0 0.8 0.9  CALCIUM 8.8 9.1 8.9   No results for input(s): AST, ALT, ALKPHOS, BILITOT, PROT, ALBUMIN in the last 8760 hours. Recent Labs    11/23/19 0000 11/23/19 0000 11/26/19 0000 11/26/19 0000 12/03/19 0000 04/10/20 0000 05/06/20 0000  WBC 18.2   < > 14.7   < > 8.9 9.0 11.8  NEUTROABS 18  --  12  --  7  --   --   HGB 11.1*   < > 9.4*   < > 11.6* 9.7* 8.6*  HCT 35*   < > 29*   < > 36 31* 28*  PLT 312   < > 224   < > 461* 315 291   < > = values in this interval not displayed.   Lab Results  Component Value Date   TSH 1.71 08/01/2018   Lab Results  Component Value Date   HGBA1C 5.7 08/01/2018   Lab Results  Component Value Date   CHOL 140 08/01/2018   HDL 46 08/01/2018   LDLCALC 80 08/01/2018   TRIG 82 08/01/2018   CHOLHDL 2.4 03/03/2012    Significant Diagnostic Results in last 30 days:  No results found.  Assessment/Plan 1. Aspiration pneumonia of right lower lobe due to gastric secretions (HCC) Has allergy to PCN Stat pCXR 2 view Stat cbc, bmp Rocephin 1g IM now zithromax  500mg  now and 250mg  daily x 4 days Continue oxygen Use albuterol hfa q6h scheduled Elevated HOB a bit more, aspiration precautions  Family/ staff  Communication: d/w wound care nurse who had given me the sbar  Labs/tests ordered:  As above  Sharla Tankard L. Van Seymore, D.O. [facility] [address], [place] [address] Cell Phone (Mon-Fri 8am-5pm):  [phone] On Call:  [phone] & follow prompts after 5pm & weekends Office Phone:  [phone] Office Fax:  [phone]

## 2020-07-09 ENCOUNTER — Other Ambulatory Visit: Payer: Self-pay | Admitting: Family

## 2020-07-09 DIAGNOSIS — M353 Polymyalgia rheumatica: Secondary | ICD-10-CM

## 2020-07-09 MED ORDER — TRAMADOL HCL 50 MG PO TABS: 50.0000 mg | ORAL_TABLET | Freq: Two times a day (BID) | ORAL | 0 refills | Status: DC

## 2020-07-10 ENCOUNTER — Encounter: Payer: Self-pay | Admitting: Internal Medicine

## 2020-07-15 DIAGNOSIS — J069 Acute upper respiratory infection, unspecified: Secondary | ICD-10-CM | POA: Diagnosis not present

## 2020-07-16 DIAGNOSIS — I10 Essential (primary) hypertension: Secondary | ICD-10-CM | POA: Diagnosis not present

## 2020-07-16 LAB — BASIC METABOLIC PANEL
BUN: 23 — AB (ref 4–21)
CO2: 23 — AB (ref 13–22)
Chloride: 103 (ref 99–108)
Creatinine: 1 (ref 0.5–1.1)
Glucose: 152
Potassium: 3.7 (ref 3.4–5.3)
Sodium: 140 (ref 137–147)

## 2020-07-16 LAB — CBC AND DIFFERENTIAL
HCT: 32 — AB (ref 36–46)
Hemoglobin: 9.6 — AB (ref 12.0–16.0)
Platelets: 460 — AB (ref 150–399)
WBC: 15.1

## 2020-07-16 LAB — COMPREHENSIVE METABOLIC PANEL
Calcium: 9.3 (ref 8.7–10.7)
GFR calc Af Amer: 57.64
GFR calc non Af Amer: 49.73

## 2020-07-16 LAB — CBC: RBC: 4.16 (ref 3.87–5.11)

## 2020-07-17 ENCOUNTER — Encounter: Payer: Self-pay | Admitting: Internal Medicine

## 2020-07-17 DIAGNOSIS — R05 Cough: Secondary | ICD-10-CM | POA: Diagnosis not present

## 2020-07-17 DIAGNOSIS — N390 Urinary tract infection, site not specified: Secondary | ICD-10-CM | POA: Diagnosis not present

## 2020-07-17 DIAGNOSIS — J189 Pneumonia, unspecified organism: Secondary | ICD-10-CM | POA: Diagnosis not present

## 2020-07-17 LAB — CBC AND DIFFERENTIAL
HCT: 28 — AB (ref 36–46)
Hemoglobin: 8.7 — AB (ref 12.0–16.0)
Platelets: 439 — AB (ref 150–399)
WBC: 10.3

## 2020-07-17 LAB — CBC: RBC: 3.6 — AB (ref 3.87–5.11)

## 2020-07-23 DIAGNOSIS — R1311 Dysphagia, oral phase: Secondary | ICD-10-CM | POA: Diagnosis not present

## 2020-08-07 ENCOUNTER — Encounter: Payer: Self-pay | Admitting: Family

## 2020-08-07 ENCOUNTER — Non-Acute Institutional Stay (SKILLED_NURSING_FACILITY): Payer: Medicare Other | Admitting: Family

## 2020-08-07 DIAGNOSIS — Z7901 Long term (current) use of anticoagulants: Secondary | ICD-10-CM

## 2020-08-07 DIAGNOSIS — H6123 Impacted cerumen, bilateral: Secondary | ICD-10-CM

## 2020-08-07 DIAGNOSIS — G301 Alzheimer's disease with late onset: Secondary | ICD-10-CM

## 2020-08-07 DIAGNOSIS — G2581 Restless legs syndrome: Secondary | ICD-10-CM | POA: Diagnosis not present

## 2020-08-07 DIAGNOSIS — F0280 Dementia in other diseases classified elsewhere without behavioral disturbance: Secondary | ICD-10-CM

## 2020-08-07 DIAGNOSIS — I10 Essential (primary) hypertension: Secondary | ICD-10-CM

## 2020-08-07 NOTE — Progress Notes (Signed)
Location:    [facility].   Nursing Home Room Number: 204-D Place of Service:  SNF (31) Provider:  Marlowe Sax, NP  Patient Care Team: [name], DO as PCP - General (Geriatric Medicine) Rehab, [facility] ([facility])  Extended Emergency Contact Information Primary Emergency Contact: Courts,[name] Address: [place], [place] [address] [name] of [place] Phone: [phone] Mobile Phone: [phone] Relation: Son Secondary Emergency Contact: [place] of [place] Phone: [phone] Relation: Daughter  Code Status:  DNR Goals of care: Advanced Directive information Advanced Directives 08/07/2020  Does Patient Have a Medical Advance Directive? Yes  Type of Paramedic of [place];Living will;Out of facility DNR (pink MOST or yellow form)  Does patient want to make changes to medical advance directive? No - Patient declined  Copy of [place] in Chart? Yes - validated most recent copy scanned in chart (See row information)  Pre-existing out of facility DNR order (yellow form or pink MOST form) -     Chief Complaint  Patient presents with  . Medical Management of Chronic Issues    Routine Visit   . Immunizations    Discuss the need for Influenza Vaccine.   . Acute Visit    Hearing problems per Son.     HPI:  Pt is a 86 y.o. female seen today for medical management of chronic diseases. She is seen in her room asleep in bed.she awakens easily.she denies any acute issues.son called stated to the Nurse that whenever he talks with patient on the phone patient does not seem to be hearing well would like her ears checked for impaction. She continue to require oxygen via nasal cannula.No shortness of breath or wheezing reported. She has had a 2 lbs weight gain over the past one month which is beneficial due to previous weight loss. No  fall episode or acute illness reported.Her blood pressure readings are well controlled for her age.No hypotension.  She due for Influenza vaccine but will be administered by facility per their protocol then will update chart.     Past Medical History:  Diagnosis Date  . Anemia 03/02/2012  . Angina pectoris, unstable ([facility]) 03/02/2012  . Anxiety   . Axillary abscess   . Bradycardia 06/13/2012  . CAD (coronary artery disease)    S/p PTCA / stenting (last cath 2004, multiple LAD stents, 2 stents in the right coronary artery all patent)  . Cancer ([facility])   . Complication of anesthesia   . Compression fracture of L2 ([facility]) 03/12/2017  . Dementia ([facility])   . Dysphagia 10/06/2012  . Dyspnea 02/03/2012  . GERD (gastroesophageal reflux disease)   . History of pulmonary embolus (PE) 03/19/2017  . HTN (hypertension)   . PMR (polymyalgia rheumatica) (HCC)   . PONV (postoperative nausea and vomiting)   . Restless leg syndrome   . 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 Started coumadin  . Weakness of both legs 06/09/2012   Past Surgical History:  Procedure Laterality Date  . BREAST LUMPECTOMY    . COLECTOMY    . ESOPHAGOGASTRODUODENOSCOPY (EGD) WITH ESOPHAGEAL DILATION  10/10/2012   Procedure: ESOPHAGOGASTRODUODENOSCOPY (EGD) WITH ESOPHAGEAL DILATION;  Surgeon: Inda Castle, MD;  Location: [facility];  Service: Endoscopy;  Laterality: N/A;  . FEMUR IM NAIL Left 12/13/2018   Procedure: INTRAMEDULLARY (IM) NAIL FEMORAL LEFT;  Surgeon: Paralee Cancel, MD;  [facility];  Service: Orthopedics;  Laterality: Left;  . heart stents  x 8  . INCISION AND DRAINAGE     bilateral axillary, non specific staff  . INCISION AND DRAINAGE ABSCESS  09/28/2012   Procedure: INCISION AND DRAINAGE ABSCESS;  Surgeon: Zenovia Jarred, MD;  Location: [facility];  Service: General;  Laterality: Bilateral;  . stent     cardiac x 8 stents.    Allergies  Allergen Reactions  . Ambien [Zolpidem Tartrate] Nausea And  Vomiting  . Codeine   . Codeine Phosphate Nausea And Vomiting  . Darvocet [Propoxyphene N-Acetaminophen] Nausea And Vomiting  . Fish Allergy   . Fish-Derived Products   . Hydrocodone   . Other     Chocolate Flavor.   . Promethazine   . Promethazine Hcl Nausea And Vomiting  . Sulfamethoxazole Other (See Comments)    Pt doesn't remember reaction  . Vicodin [Hydrocodone-Acetaminophen] Other (See Comments)    Unknown reaction  . Amoxicillin Nausea And Vomiting and Rash  . Penicillins Nausea And Vomiting and Rash    Allergies as of 08/07/2020      Reactions   Ambien [zolpidem Tartrate] Nausea And Vomiting   Codeine    Codeine Phosphate Nausea And Vomiting   Darvocet [propoxyphene N-acetaminophen] Nausea And Vomiting   Fish Allergy    Fish-derived Products    Hydrocodone    Other    Chocolate Flavor.    Promethazine    Promethazine Hcl Nausea And Vomiting   Sulfamethoxazole Other (See Comments)   Pt doesn't remember reaction   Vicodin [hydrocodone-acetaminophen] Other (See Comments)   Unknown reaction   Amoxicillin Nausea And Vomiting, Rash   Penicillins Nausea And Vomiting, Rash      Medication List       Accurate as of August 07, 2020 12:09 PM. If you have any questions, ask your nurse or doctor.        acetaminophen 325 MG tablet Commonly known as: TYLENOL Take 650 mg by mouth every 6 (six) hours as needed for mild pain, moderate pain or fever.   acetaminophen 325 MG tablet Commonly known as: TYLENOL Take 650 mg by mouth every 6 (six) hours. Take routinely for left hip pain   ALPRAZolam 0.5 MG tablet Commonly known as: XANAX Take 0.5 tablets (0.25 mg total) by mouth daily.   amLODipine 10 MG tablet Commonly known as: NORVASC Take 10 mg by mouth every morning.   donepezil 10 MG tablet Commonly known as: ARICEPT Take 10 mg by mouth at bedtime.   Doxepin HCl 3 MG Tabs Take 3 mg by mouth at bedtime.   losartan 50 MG tablet Commonly known as: COZAAR Take  50 mg by mouth at bedtime.   nitroGLYCERIN 0.4 MG SL tablet Commonly known as: NITROSTAT Place 0.4 mg under the tongue every 5 (five) minutes as needed for chest pain.   NON FORMULARY Diet Order: Mech Soft Liberalized Diet.   NUTRITIONAL SUPPLEMENT PO Take 1 each by mouth 2 (two) times a day. Magic Cup - take with lunch and dinner   ondansetron 4 MG tablet Commonly known as: ZOFRAN Take 4 mg by mouth every 4 (four) hours as needed for nausea.   polyethylene glycol 17 g packet Commonly known as: MIRALAX / GLYCOLAX Take 17 g by mouth daily as needed for moderate constipation.   predniSONE 1 MG tablet Commonly known as: DELTASONE Take 1 mg by mouth daily with breakfast.   ProAir  HFA 108 (90 Base) MCG/ACT inhaler Generic drug: albuterol Inhale 2 puffs into the lungs every 6 (six) hours as needed for wheezing or shortness of breath.   rOPINIRole 2 MG tablet Commonly known as: REQUIP Take 4 mg by mouth at bedtime. Take 1 tablet by mouth every evening 2 hours before bedtime.   rOPINIRole 1 MG tablet Commonly known as: REQUIP 3 mg. Take 1 tablet by mouth twice daily at lunch and at bedtime.   senna-docusate 8.6-50 MG tablet Commonly known as: Senokot-S Take 1 tablet by mouth 2 (two) times daily.   traMADol 50 MG tablet Commonly known as: ULTRAM Take 1 tablet (50 mg total) by mouth 2 (two) times daily.   Tums 500 MG chewable tablet Generic drug: calcium carbonate Chew 1 tablet by mouth 3 (three) times daily as needed for indigestion or heartburn.   Vitamin D3 1.25 MG (50000 UT) Caps Take 1 capsule by mouth once a week.   warfarin 2 MG tablet Commonly known as: COUMADIN Take 2 mg by mouth daily.       Review of Systems  Constitutional: Negative for appetite change, chills, fatigue, fever and unexpected weight change.  HENT: Positive for hearing loss. Negative for congestion, ear discharge, ear pain, rhinorrhea, sinus pressure, sinus pain, sneezing, sore throat and  trouble swallowing.   Eyes: Negative for discharge, redness and itching.  Respiratory: Negative for cough, chest tightness, shortness of breath and wheezing.        Oxygen via nasal cannula in place   Cardiovascular: Negative for chest pain, palpitations and leg swelling.  Gastrointestinal: Negative for abdominal distention, abdominal pain, constipation, diarrhea, nausea and vomiting.  Endocrine: Negative for cold intolerance, heat intolerance, polydipsia, polyphagia and polyuria.  Genitourinary: Negative for difficulty urinating, dysuria and urgency.       Incontinent   Musculoskeletal: Positive for arthralgias and gait problem. Negative for joint swelling and myalgias.  Skin: Negative for color change, pallor and rash.  Neurological: Negative for dizziness, speech difficulty, light-headedness and headaches.  Hematological: Does not bruise/bleed easily.  Psychiatric/Behavioral: Negative for agitation, behavioral problems and sleep disturbance. The patient is not nervous/anxious.     Immunization History  Administered Date(s) Administered  . Influenza-Unspecified 08/01/2016, 08/11/2017, 07/02/2018, 08/02/2019  . Moderna SARS-COVID-2 Vaccination 11/01/2019, 11/29/2019  . PPD Test 03/18/2017  . Pneumococcal Conjugate-13 09/17/2017  . Pneumococcal Polysaccharide-23 04/17/2019   Pertinent  Health Maintenance Due  Topic Date Due  . INFLUENZA VACCINE  06/01/2020  . DEXA SCAN  Completed  . PNA vac Low Risk Adult  Completed   Fall Risk  03/14/2018  Falls in the past year? No    Vitals:   08/07/20 1104  BP: 130/72  Pulse: 64  Resp: 17  Temp: 98 F (36.7 C)  SpO2: 95%  Weight: 139 lb 6.4 oz (63.2 kg)  Height: 5\' 1"  (1.549 m)   Body mass index is 26.34 kg/m. Physical Exam Vitals and nursing note reviewed.  Constitutional:      General: She is not in acute distress.    Appearance: She is not ill-appearing.     Interventions: Nasal cannula in place.  HENT:     Head:  Normocephalic.     Right Ear: There is impacted cerumen.     Left Ear: There is impacted cerumen.     Ears:     Comments: TM not visualized due to cerumen impaction     Nose: Nose normal. No congestion or rhinorrhea.     Mouth/Throat:  Mouth: Mucous membranes are moist.     Pharynx: Oropharynx is clear. No oropharyngeal exudate or posterior oropharyngeal erythema.  Eyes:     General: No scleral icterus.       Right eye: No discharge.        Left eye: No discharge.     Conjunctiva/sclera: Conjunctivae normal.     Pupils: Pupils are equal, round, and reactive to light.  Neck:     Vascular: No carotid bruit.  Cardiovascular:     Rate and Rhythm: Normal rate and regular rhythm.     Pulses: Normal pulses.     Heart sounds: Normal heart sounds. No murmur heard.  No friction rub. No gallop.   Pulmonary:     Effort: Pulmonary effort is normal. No respiratory distress.     Breath sounds: Normal breath sounds. No wheezing, rhonchi or rales.     Comments: oxygen via nasal cannula in place  Chest:     Chest wall: No tenderness.  Abdominal:     General: Bowel sounds are normal. There is no distension.     Palpations: Abdomen is soft. There is no mass.     Tenderness: There is no abdominal tenderness. There is no guarding or rebound.  Musculoskeletal:        General: No swelling or tenderness.     Cervical back: Normal range of motion. No rigidity or tenderness.     Right lower leg: No edema.     Left lower leg: No edema.     Comments: Requires assistance with transfer moves x 4 extremities   Lymphadenopathy:     Cervical: No cervical adenopathy.  Skin:    General: Skin is warm and dry.     Coloration: Skin is not pale.     Findings: No bruising, erythema or rash.  Neurological:     Mental Status: She is alert. Mental status is at baseline.     Cranial Nerves: No cranial nerve deficit.     Coordination: Coordination normal.     Gait: Gait abnormal.     Comments: HOH scored 7/15  on BIM done by facility   Psychiatric:        Mood and Affect: Mood normal.        Behavior: Behavior normal.        Thought Content: Thought content normal.        Judgment: Judgment normal.    Labs reviewed: Recent Labs    05/06/20 0000 07/08/20 0000 07/16/20 0000  NA 137 133* 140  K 4.7 4.5 3.7  CL 104 98* 103  CO2 21 22 23*  BUN 27* 18 23*  CREATININE 0.9 0.9 1.0  CALCIUM 8.9 9.2 9.3    Recent Labs    11/23/19 0000 11/23/19 0000 11/26/19 0000 11/26/19 0000 12/03/19 0000 04/10/20 0000 07/08/20 0000 07/16/20 0000 07/17/20 0000  WBC 18.2   < > 14.7   < > 8.9   < > 8.1 15.1 10.3  NEUTROABS 18  --  12  --  7  --   --   --   --   HGB 11.1*   < > 9.4*   < > 11.6*   < > 10.3* 9.6* 8.7*  HCT 35*   < > 29*   < > 36   < > 34* 32* 28*  PLT 312   < > 224   < > 461*   < > 351 460* 439*   < > = values  in this interval not displayed.   Lab Results  Component Value Date   TSH 1.71 08/01/2018   Lab Results  Component Value Date   HGBA1C 5.7 08/01/2018   Lab Results  Component Value Date   CHOL 140 08/01/2018   HDL 46 08/01/2018   LDLCALC 80 08/01/2018   TRIG 82 08/01/2018   CHOLHDL 2.4 03/03/2012    Significant Diagnostic Results in last 30 days:  No results found.  Assessment/Plan  1. Bilateral impacted cerumen Bilateral TM not visualized due to cerumen impaction.Afebrile.no pain reported. Debrox 6.5% otic solution instil 5 drops into both ears then facility Nurse to lavage with warn water and peroxide.  2. Essential hypertension B/p well controlled for her age. - continue on Amlodipine,losartan   3. Restless leg syndrome Continue on Requip 3 mg tablet   4. Late onset Alzheimer's disease without behavioral disturbance [facility]) Staff reports no new behavioral issues. Continue on Aricept   5. Chronic anticoagulation On coumadin 3 mg tablet tablet daily recent increased due to subtherapeutic INR 1 next INR check due   Family/ staff Communication: Reviewed  plan of care with patient and facility Nurse   Labs/tests ordered: None

## 2020-08-11 ENCOUNTER — Other Ambulatory Visit: Payer: Self-pay | Admitting: Family

## 2020-08-11 DIAGNOSIS — M353 Polymyalgia rheumatica: Secondary | ICD-10-CM

## 2020-08-11 MED ORDER — TRAMADOL HCL 50 MG PO TABS: 50.0000 mg | ORAL_TABLET | Freq: Two times a day (BID) | ORAL | 0 refills | Status: DC

## 2020-08-11 NOTE — Progress Notes (Signed)
Tramadol 50 mg tablet one by mouth twice daily # 60 with no refill send to [facility] per facility Nurse request for PMR pain.

## 2020-08-19 DIAGNOSIS — Z23 Encounter for immunization: Secondary | ICD-10-CM | POA: Diagnosis not present

## 2020-08-29 ENCOUNTER — Encounter: Payer: Self-pay | Admitting: Adult Health

## 2020-08-29 ENCOUNTER — Non-Acute Institutional Stay (SKILLED_NURSING_FACILITY): Payer: Medicare Other | Admitting: Adult Health

## 2020-08-29 DIAGNOSIS — Z86711 Personal history of pulmonary embolism: Secondary | ICD-10-CM | POA: Diagnosis not present

## 2020-08-29 DIAGNOSIS — Z7901 Long term (current) use of anticoagulants: Secondary | ICD-10-CM

## 2020-09-01 DIAGNOSIS — I2699 Other pulmonary embolism without acute cor pulmonale: Secondary | ICD-10-CM | POA: Diagnosis not present

## 2020-09-01 DIAGNOSIS — Z7901 Long term (current) use of anticoagulants: Secondary | ICD-10-CM | POA: Diagnosis not present

## 2020-09-01 DIAGNOSIS — E039 Hypothyroidism, unspecified: Secondary | ICD-10-CM | POA: Diagnosis not present

## 2020-09-01 DIAGNOSIS — E119 Type 2 diabetes mellitus without complications: Secondary | ICD-10-CM | POA: Diagnosis not present

## 2020-09-06 NOTE — Progress Notes (Signed)
Location:  [facility] Room Number: 204-D Place of Service:  SNF (31) Provider:  Durenda Age, DNP, FNP-BC  Patient Care Team: [name], DO as PCP - General (Geriatric Medicine) Rehab, [place] ([place])  Extended Emergency Contact Information Primary Emergency Contact: Easterly,[name] Address: [place], [place] [address] [name] of [place] Phone: [phone] Mobile Phone: [phone] Relation: Son Secondary Emergency Contact: [place] of [place] Phone: [phone] Relation: Daughter  Code Status:  DNR  Goals of care: Advanced Directive information Advanced Directives 08/29/2020  Does Patient Have a Medical Advance Directive? Yes  Type of Advance Directive Out of facility DNR (pink MOST or yellow form)  Does patient want to make changes to medical advance directive? No - Patient declined  Copy of [place] in Chart? -  Pre-existing out of facility DNR order (yellow form or pink MOST form) Yellow form placed in chart (order not valid for inpatient use)     Chief Complaint  Patient presents with  . Acute Visit    INR management    HPI:  Pt is a 86 y.o. female who was seen today for INR management.  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 She has a PMH of CAD, angina pectoris, cancer and hypertension. Latest INR  3.8, supratherapeutic. INR goal 2-3. She is on Coumadin for history of DVT and PE. Current Coumadin dosage is 2.5 mg on MWF and 3.0 mg on TThSS. No bleeding nor bruising noted.   Past Medical History:  Diagnosis Date  . Anemia 03/02/2012  . Angina pectoris, unstable ([place]) 03/02/2012  . Anxiety   . Axillary abscess   . Bradycardia 06/13/2012  . CAD (coronary artery disease)    S/p PTCA / stenting (last cath 2004, multiple LAD stents, 2 stents in the right coronary artery  all patent)  . Cancer ([place])   . Complication of anesthesia   . Compression fracture of L2 ([place]) 03/12/2017  . Dementia ([place])   . Dysphagia 10/06/2012  . Dyspnea 02/03/2012  . GERD (gastroesophageal reflux disease)   . History of pulmonary embolus (PE) 03/19/2017  . HTN (hypertension)   . PMR (polymyalgia rheumatica) (HCC)   . PONV (postoperative nausea and vomiting)   . Restless leg syndrome   . 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 Started coumadin  . Weakness of both legs 06/09/2012   Past Surgical History:  Procedure Laterality Date  . BREAST LUMPECTOMY    . COLECTOMY    . ESOPHAGOGASTRODUODENOSCOPY (EGD) WITH ESOPHAGEAL DILATION  10/10/2012   Procedure: ESOPHAGOGASTRODUODENOSCOPY (EGD) WITH ESOPHAGEAL DILATION;  Surgeon: Inda Castle, MD;  Location: [facility];  Service: Endoscopy;  Laterality: N/A;  . FEMUR IM NAIL Left 12/13/2018   Procedure: INTRAMEDULLARY (IM) NAIL FEMORAL LEFT;  Surgeon: Paralee Cancel, MD;  Location: [facility];  Service: Orthopedics;  Laterality: Left;  . heart stents  x 8  . INCISION AND DRAINAGE     bilateral axillary, non specific staff  . INCISION AND DRAINAGE ABSCESS  09/28/2012   Procedure: INCISION AND DRAINAGE ABSCESS;  Surgeon: Zenovia Jarred, MD;  Location: [facility];  Service: General;  Laterality: Bilateral;  . stent     cardiac x 8 stents.    Allergies  Allergen Reactions  . Ambien [Zolpidem Tartrate] Nausea And Vomiting  .  Codeine   . Codeine Phosphate Nausea And Vomiting  . Darvocet [Propoxyphene N-Acetaminophen] Nausea And Vomiting  . Fish Allergy   . Fish-Derived Products   . Hydrocodone   . Other     Chocolate Flavor.   . Promethazine   . Promethazine Hcl Nausea And Vomiting  . Sulfamethoxazole Other (See Comments)    Pt doesn't remember reaction  . Vicodin [Hydrocodone-Acetaminophen] Other (See Comments)    Unknown reaction  . Amoxicillin Nausea And Vomiting and Rash  . Penicillins Nausea And Vomiting and Rash     Outpatient Encounter Medications as of 08/29/2020  Medication Sig  . acetaminophen (TYLENOL) 325 MG tablet Take 650 mg by mouth every 6 (six) hours as needed for mild pain, moderate pain or fever.  Marland Kitchen albuterol (PROAIR HFA) 108 (90 Base) MCG/ACT inhaler Inhale 2 puffs into the lungs every 6 (six) hours as needed for wheezing or shortness of breath.  . ALPRAZolam (XANAX) 0.5 MG tablet Take 0.5 tablets (0.25 mg total) by mouth daily.  Marland Kitchen amLODipine (NORVASC) 10 MG tablet Take 10 mg by mouth every morning.   . calcium carbonate (TUMS) 500 MG chewable tablet Chew 1 tablet by mouth 3 (three) times daily as needed for indigestion or heartburn.  . Cholecalciferol (VITAMIN D3) 1.25 MG (50000 UT) CAPS Take 1 capsule by mouth once a week.  . donepezil (ARICEPT) 10 MG tablet Take 10 mg by mouth at bedtime.   . Doxepin HCl 3 MG TABS Take 3 mg by mouth at bedtime.  Marland Kitchen losartan (COZAAR) 50 MG tablet Take 50 mg by mouth at bedtime.   . nitroGLYCERIN (NITROSTAT) 0.4 MG SL tablet Place 0.4 mg under the tongue every 5 (five) minutes as needed for chest pain.  . NON FORMULARY Diet Order: Mech Soft Liberalized Diet.  . Nutritional Supplements (NUTRITIONAL SUPPLEMENT PO) Take 1 each by mouth 2 (two) times a day. Magic Cup - take with lunch and dinner  . ondansetron (ZOFRAN) 4 MG tablet Take 4 mg by mouth every 4 (four) hours as needed for nausea.   . OXYGEN Inhale 2 L/min into the lungs as needed. Wean as able to keep SPO2 greater than 92%  . polyethylene glycol (MIRALAX / GLYCOLAX) packet Take 17 g by mouth daily as needed for moderate constipation.  . predniSONE (DELTASONE) 1 MG tablet Take 1 mg by mouth daily with breakfast.   . rOPINIRole (REQUIP) 1 MG tablet 1 mg. Take 1 tablet by mouth twice daily at lunch and at bedtime.  Marland Kitchen rOPINIRole (REQUIP) 2 MG tablet Take 2 mg by mouth at bedtime. Take 1 tablet by mouth every evening 2 hours before bedtime.   . senna-docusate (SENOKOT-S) 8.6-50 MG tablet Take 1 tablet  by mouth 2 (two) times daily.  . traMADol (ULTRAM) 50 MG tablet Take 1 tablet (50 mg total) by mouth 2 (two) times daily.  Marland Kitchen warfarin (COUMADIN) 2.5 MG tablet Take 2.5 mg by mouth every Monday, Wednesday, and Friday.  . warfarin (COUMADIN) 3 MG tablet Take 3 mg by mouth See admin instructions. Tue, Thu, Sat, Sun  . [DISCONTINUED] acetaminophen (TYLENOL) 325 MG tablet Take 650 mg by mouth every 6 (six) hours. Take routinely for left hip pain  . [DISCONTINUED] warfarin (COUMADIN) 2 MG tablet Take 2 mg by mouth daily.    No facility-administered encounter medications on file as of 08/29/2020.    Review of Systems  GENERAL: No fever, chills or weakness MOUTH and THROAT: Denies oral discomfort, gingival pain or  bleeding RESPIRATORY: no cough, SOB, DOE, wheezing, hemoptysis CARDIAC: No chest pain, edema or palpitations GI: No abdominal pain, diarrhea, constipation, heart burn, nausea or vomiting NEUROLOGICAL: Denies dizziness, syncope, numbness, or headache PSYCHIATRIC: Denies feelings of depression or anxiety. No report of hallucinations, insomnia, paranoia, or agitation   Immunization History  Administered Date(s) Administered  . Influenza-Unspecified 08/01/2016, 08/11/2017, 07/02/2018, 08/02/2019  . Moderna SARS-COVID-2 Vaccination 11/01/2019, 11/29/2019  . PPD Test 03/18/2017  . Pneumococcal Conjugate-13 09/17/2017  . Pneumococcal Polysaccharide-23 04/17/2019   Pertinent  Health Maintenance Due  Topic Date Due  . INFLUENZA VACCINE  06/01/2020  . DEXA SCAN  Completed  . PNA vac Low Risk Adult  Completed   Fall Risk  03/14/2018  Falls in the past year? No     Vitals:   08/29/20 1619  BP: 124/66  Pulse: 72  Resp: 16  Temp: 98 F (36.7 C)  TempSrc: Oral  SpO2: 97%  Weight: 139 lb 6.4 oz (63.2 kg)  Height: 5\' 1"  (1.549 m)   Body mass index is 26.34 kg/m.  Physical Exam  GENERAL APPEARANCE: Well nourished. In no acute distress. Normal body habitus SKIN:  Skin is warm  and dry.  MOUTH and THROAT: Lips are without lesions. Oral mucosa is moist and without lesions. Tongue is normal in shape, size, and color and without lesions RESPIRATORY: Breathing is even & unlabored, BS CTAB CARDIAC: RRR, no murmur,no extra heart sounds, no edema GI: Abdomen soft, normal BS, no masses, no tenderness NEUROLOGICAL: There is no tremor. Speech is clear. Alert to self, disoriented to time and place. PSYCHIATRIC:  Affect and behavior are appropriate  Labs reviewed: Recent Labs    05/06/20 0000 07/08/20 0000 07/16/20 0000  NA 137 133* 140  K 4.7 4.5 3.7  CL 104 98* 103  CO2 21 22 23*  BUN 27* 18 23*  CREATININE 0.9 0.9 1.0  CALCIUM 8.9 9.2 9.3    Recent Labs    11/23/19 0000 11/23/19 0000 11/26/19 0000 11/26/19 0000 12/03/19 0000 04/10/20 0000 07/08/20 0000 07/16/20 0000 07/17/20 0000  WBC 18.2   < > 14.7   < > 8.9   < > 8.1 15.1 10.3  NEUTROABS 18  --  12  --  7  --   --   --   --   HGB 11.1*   < > 9.4*   < > 11.6*   < > 10.3* 9.6* 8.7*  HCT 35*   < > 29*   < > 36   < > 34* 32* 28*  PLT 312   < > 224   < > 461*   < > 351 460* 439*   < > = values in this interval not displayed.   Lab Results  Component Value Date   TSH 1.71 08/01/2018   Lab Results  Component Value Date   HGBA1C 5.7 08/01/2018   Lab Results  Component Value Date   CHOL 140 08/01/2018   HDL 46 08/01/2018   LDLCALC 80 08/01/2018   TRIG 82 08/01/2018   CHOLHDL 2.4 03/03/2012     Assessment/Plan  1. Long term current use of anticoagulant -  INR 3.8, supratherapeutic -  Will hold Coumadin X 2 days and re-check INR on 09/01/20  2. History of pulmonary embolus (PE) -  No SOB -  On Coumadin therapy    Family/ staff Communication:  Discussed plan of care with resident and charge nurse.  Labs/tests ordered:  INR on 09/01/20  Goals  of care:  Long-term care   Durenda Age, DNP, MSN, FNP-BC [facility] [phone] (Monday-Friday 8:00  a.m. - 5:00 p.m.) [phone] (after hours)

## 2020-09-10 ENCOUNTER — Non-Acute Institutional Stay (SKILLED_NURSING_FACILITY): Payer: Medicare Other | Admitting: Family

## 2020-09-10 ENCOUNTER — Encounter: Payer: Self-pay | Admitting: Family

## 2020-09-10 ENCOUNTER — Other Ambulatory Visit: Payer: Self-pay | Admitting: Orthopedic Surgery

## 2020-09-10 DIAGNOSIS — F411 Generalized anxiety disorder: Secondary | ICD-10-CM

## 2020-09-10 DIAGNOSIS — G2581 Restless legs syndrome: Secondary | ICD-10-CM | POA: Diagnosis not present

## 2020-09-10 DIAGNOSIS — Z7901 Long term (current) use of anticoagulants: Secondary | ICD-10-CM

## 2020-09-10 DIAGNOSIS — M353 Polymyalgia rheumatica: Secondary | ICD-10-CM

## 2020-09-10 DIAGNOSIS — G301 Alzheimer's disease with late onset: Secondary | ICD-10-CM

## 2020-09-10 DIAGNOSIS — I10 Essential (primary) hypertension: Secondary | ICD-10-CM

## 2020-09-10 DIAGNOSIS — F0280 Dementia in other diseases classified elsewhere without behavioral disturbance: Secondary | ICD-10-CM

## 2020-09-10 DIAGNOSIS — Z86711 Personal history of pulmonary embolism: Secondary | ICD-10-CM

## 2020-09-10 MED ORDER — TRAMADOL HCL 50 MG PO TABS: 50.0000 mg | ORAL_TABLET | Freq: Two times a day (BID) | ORAL | 0 refills | Status: DC

## 2020-09-10 NOTE — Progress Notes (Signed)
Location:    [facility].   [facility] Room Number: 204-D Place of Service:  SNF (31) Provider: Marlowe Sax, NP     Patient Care Team: [name], DO as PCP - General (Geriatric Medicine) Rehab, [place] ([place])  Extended Emergency Contact Information Primary Emergency Contact: Filter,[name] Address: [place], [place] [address] [name] of [place] Phone: [phone] Mobile Phone: [phone] Relation: Son Secondary Emergency Contact: [place] of [place] Phone: [phone] Relation: Daughter  Code Status:  DNR Goals of care: Advanced Directive information Advanced Directives 09/10/2020  Does Patient Have a Medical Advance Directive? Yes  Type of Advance Directive Out of facility DNR (pink MOST or yellow form);[place];Living will  Does patient want to make changes to medical advance directive? No - Patient declined  Copy of [place] in Chart? Yes - validated most recent copy scanned in chart (See row information)  Pre-existing out of facility DNR order (yellow form or pink MOST form) -     Chief Complaint  Patient presents with  . Medical Management of Chronic Issues    Routine Visit.   . Immunizations    Discuss the need for Influenza Vaccine.    HPI:  Pt is a 86 y.o. female seen today for medical management of chronic diseases. She is seen in bed coloring her book.she denies any acute issues.she has a medical history of Hypertension,CAD,Restless leg syndrome, PMR on prednisone,Generalized Anxiety disorder,Late onset Alzheimer's dementia,Hx of PE on coumadin,CKD stage 3,Hypoxia on oxygen via nasal cannula among others. She had previous weight loss but latest weight has been stable.  No recent fall episiode reported. Her blood pressure log reviewed readings in the 100's/50's - 140's/70's no signs of hypotension  reported. Her recent INR was 2.1 Dr. Mariea Clonts was notified by Nurse orders in place to continue coumadin at 2.5 mg tablet and INR to be rechecked on 09/16/2020. No signs of bleeding reported.    Past Medical History:  Diagnosis Date  . Anemia 03/02/2012  . Angina pectoris, unstable ([place]) 03/02/2012  . Anxiety   . Axillary abscess   . Bradycardia 06/13/2012  . CAD (coronary artery disease)    S/p PTCA / stenting (last cath 2004, multiple LAD stents, 2 stents in the right coronary artery all patent)  . Cancer ([place])   . Complication of anesthesia   . Compression fracture of L2 ([place]) 03/12/2017  . Dementia ([place])   . Dysphagia 10/06/2012  . Dyspnea 02/03/2012  . GERD (gastroesophageal reflux disease)   . History of pulmonary embolus (PE) 03/19/2017  . HTN (hypertension)   . PMR (polymyalgia rheumatica) (HCC)   . PONV (postoperative nausea and vomiting)   . Restless leg syndrome   . 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 Started coumadin  . Weakness of both legs 06/09/2012   Past Surgical History:  Procedure Laterality Date  . BREAST LUMPECTOMY    . COLECTOMY    . ESOPHAGOGASTRODUODENOSCOPY (EGD) WITH ESOPHAGEAL DILATION  10/10/2012   Procedure: ESOPHAGOGASTRODUODENOSCOPY (EGD) WITH ESOPHAGEAL DILATION;  Surgeon: Inda Castle, MD;  Location: [facility];  Service: Endoscopy;  Laterality: N/A;  . FEMUR IM NAIL Left 12/13/2018   Procedure: INTRAMEDULLARY (IM) NAIL FEMORAL LEFT;  Surgeon: Paralee Cancel, MD;  [facility];  Service: Orthopedics;  Laterality: Left;  . heart stents  x  8  . INCISION AND DRAINAGE     bilateral axillary, non specific staff  . INCISION AND DRAINAGE ABSCESS  09/28/2012   Procedure: INCISION AND DRAINAGE ABSCESS;  Surgeon: Zenovia Jarred, MD;  Location: [facility];  Service: General;  Laterality: Bilateral;  . stent     cardiac x 8 stents.    Allergies  Allergen Reactions  . Ambien [Zolpidem Tartrate] Nausea And Vomiting  . Codeine   . Codeine  Phosphate Nausea And Vomiting  . Darvocet [Propoxyphene N-Acetaminophen] Nausea And Vomiting  . Fish Allergy   . Fish-Derived Products   . Hydrocodone   . Other     Chocolate Flavor.   . Promethazine   . Promethazine Hcl Nausea And Vomiting  . Sulfamethoxazole Other (See Comments)    Pt doesn't remember reaction  . Vicodin [Hydrocodone-Acetaminophen] Other (See Comments)    Unknown reaction  . Amoxicillin Nausea And Vomiting and Rash  . Penicillins Nausea And Vomiting and Rash    Allergies as of 09/10/2020      Reactions   Ambien [zolpidem Tartrate] Nausea And Vomiting   Codeine    Codeine Phosphate Nausea And Vomiting   Darvocet [propoxyphene N-acetaminophen] Nausea And Vomiting   Fish Allergy    Fish-derived Products    Hydrocodone    Other    Chocolate Flavor.    Promethazine    Promethazine Hcl Nausea And Vomiting   Sulfamethoxazole Other (See Comments)   Pt doesn't remember reaction   Vicodin [hydrocodone-acetaminophen] Other (See Comments)   Unknown reaction   Amoxicillin Nausea And Vomiting, Rash   Penicillins Nausea And Vomiting, Rash      Medication List       Accurate as of September 10, 2020 11:05 AM. If you have any questions, ask your nurse or doctor.        acetaminophen 325 MG tablet Commonly known as: TYLENOL Take 650 mg by mouth every 6 (six) hours as needed for mild pain, moderate pain or fever.   ALPRAZolam 0.5 MG tablet Commonly known as: XANAX Take 0.5 tablets (0.25 mg total) by mouth daily.   amLODipine 10 MG tablet Commonly known as: NORVASC Take 10 mg by mouth every morning.   donepezil 10 MG tablet Commonly known as: ARICEPT Take 10 mg by mouth at bedtime.   Doxepin HCl 3 MG Tabs Take 3 mg by mouth at bedtime.   losartan 50 MG tablet Commonly known as: COZAAR Take 50 mg by mouth at bedtime.   nitroGLYCERIN 0.4 MG SL tablet Commonly known as: NITROSTAT Place 0.4 mg under the tongue every 5 (five) minutes as needed for chest  pain.   NON FORMULARY Diet Order: Mech Soft Liberalized Diet.   NUTRITIONAL SUPPLEMENT PO Take 1 each by mouth 2 (two) times a day. Magic Cup - take with lunch and dinner   ondansetron 4 MG tablet Commonly known as: ZOFRAN Take 4 mg by mouth every 4 (four) hours as needed for nausea.   OXYGEN Inhale 2 L/min into the lungs as needed. Wean as able to keep SPO2 greater than 92%   polyethylene glycol 17 g packet Commonly known as: MIRALAX / GLYCOLAX Take 17 g by mouth daily as needed for moderate constipation.   predniSONE 1 MG tablet Commonly known as: DELTASONE Take 1 mg by mouth daily with breakfast.   ProAir HFA 108 (90 Base) MCG/ACT inhaler Generic drug: albuterol Inhale 2 puffs into the lungs every 6 (six) hours as needed for wheezing or shortness  of breath.   rOPINIRole 2 MG tablet Commonly known as: REQUIP Take 2 mg by mouth at bedtime. Take 1 tablet by mouth every evening 2 hours before bedtime.   rOPINIRole 1 MG tablet Commonly known as: REQUIP 1 mg. Take 1 tablet by mouth twice daily at lunch and at bedtime.   senna-docusate 8.6-50 MG tablet Commonly known as: Senokot-S Take 1 tablet by mouth 2 (two) times daily.   traMADol 50 MG tablet Commonly known as: ULTRAM Take 1 tablet (50 mg total) by mouth 2 (two) times daily.   Tums 500 MG chewable tablet Generic drug: calcium carbonate Chew 1 tablet by mouth 3 (three) times daily as needed for indigestion or heartburn.   Vitamin D3 1.25 MG (50000 UT) Caps Take 1 capsule by mouth once a week.   warfarin 2.5 MG tablet Commonly known as: COUMADIN Take 2.5 mg by mouth daily. What changed: Another medication with the same name was removed. Continue taking this medication, and follow the directions you see here. Changed by: Sandrea Hughs, NP       Review of Systems  Constitutional: Negative for appetite change, chills, fatigue, fever and unexpected weight change.  HENT: Positive for hearing loss. Negative for  congestion, postnasal drip, rhinorrhea, sinus pressure, sinus pain, sneezing and sore throat.   Eyes: Negative for discharge, redness and itching.  Respiratory: Negative for cough, chest tightness, shortness of breath and wheezing.        Oxygen 2 liters via nasal cannula in place   Cardiovascular: Negative for chest pain, palpitations and leg swelling.  Gastrointestinal: Negative for abdominal distention, abdominal pain, constipation, diarrhea, nausea and vomiting.  Endocrine: Negative for cold intolerance, heat intolerance, polydipsia, polyphagia and polyuria.  Genitourinary: Negative for difficulty urinating, dysuria, flank pain, frequency and urgency.  Musculoskeletal: Positive for arthralgias and gait problem. Negative for joint swelling, myalgias and neck pain.  Skin: Negative for color change, pallor, rash and wound.  Neurological: Negative for dizziness, speech difficulty, weakness, light-headedness and headaches.  Hematological: Does not bruise/bleed easily.  Psychiatric/Behavioral: Negative for agitation, behavioral problems and sleep disturbance. The patient is not nervous/anxious.     Immunization History  Administered Date(s) Administered  . Influenza-Unspecified 08/01/2016, 08/11/2017, 07/02/2018, 08/02/2019  . Moderna SARS-COVID-2 Vaccination 11/01/2019, 11/29/2019  . PPD Test 03/18/2017  . Pneumococcal Conjugate-13 09/17/2017  . Pneumococcal Polysaccharide-23 04/17/2019   Pertinent  Health Maintenance Due  Topic Date Due  . INFLUENZA VACCINE  06/01/2020  . DEXA SCAN  Completed  . PNA vac Low Risk Adult  Completed   Fall Risk  03/14/2018  Falls in the past year? No   Functional Status Survey:    Vitals:   09/10/20 1056  BP: (!) 108/52  Pulse: (!) 53  Resp: 18  Temp: 98 F (36.7 C)  SpO2: 96%  Weight: 139 lb (63 kg)  Height: 5\' 1"  (1.549 m)   Body mass index is 26.26 kg/m. Physical Exam Vitals and nursing note reviewed.  Constitutional:      General:  She is not in acute distress.    Appearance: She is not ill-appearing.  HENT:     Head: Normocephalic.     Nose: Nose normal. No congestion or rhinorrhea.     Mouth/Throat:     Mouth: Mucous membranes are moist.     Pharynx: Oropharynx is clear. No oropharyngeal exudate or posterior oropharyngeal erythema.  Eyes:     General: No scleral icterus.       Right eye: No  discharge.        Left eye: No discharge.     Conjunctiva/sclera: Conjunctivae normal.     Pupils: Pupils are equal, round, and reactive to light.  Cardiovascular:     Rate and Rhythm: Normal rate and regular rhythm.     Pulses: Normal pulses.     Heart sounds: No murmur heard.  No gallop.   Pulmonary:     Effort: Pulmonary effort is normal. No respiratory distress.     Breath sounds: Normal breath sounds. No wheezing, rhonchi or rales.     Comments: Oxygen 2 liters via nasal cannula in place  Chest:     Chest wall: No tenderness.  Abdominal:     General: Bowel sounds are normal. There is no distension.     Palpations: Abdomen is soft. There is no mass.     Tenderness: There is no abdominal tenderness. There is no right CVA tenderness, left CVA tenderness, guarding or rebound.  Musculoskeletal:        General: No swelling or tenderness.     Cervical back: Normal range of motion. No rigidity or tenderness.     Right lower leg: No edema.     Left lower leg: No edema.     Comments: Requires assistance with transfer.bilateral arthritic changes to fingers  Lymphadenopathy:     Cervical: No cervical adenopathy.  Skin:    General: Skin is warm and dry.     Coloration: Skin is not pale.     Findings: No bruising, erythema or rash.  Neurological:     Mental Status: She is alert.     Cranial Nerves: No cranial nerve deficit.     Motor: No weakness.     Gait: Gait abnormal.  Psychiatric:        Mood and Affect: Mood normal.        Speech: Speech normal.        Behavior: Behavior normal.        Thought Content:  Thought content normal.        Cognition and Memory: Memory is impaired.     Labs reviewed: Recent Labs    05/06/20 0000 07/08/20 0000 07/16/20 0000  NA 137 133* 140  K 4.7 4.5 3.7  CL 104 98* 103  CO2 21 22 23*  BUN 27* 18 23*  CREATININE 0.9 0.9 1.0  CALCIUM 8.9 9.2 9.3    Recent Labs    11/23/19 0000 11/23/19 0000 11/26/19 0000 11/26/19 0000 12/03/19 0000 04/10/20 0000 07/08/20 0000 07/16/20 0000 07/17/20 0000  WBC 18.2   < > 14.7   < > 8.9   < > 8.1 15.1 10.3  NEUTROABS 18  --  12  --  7  --   --   --   --   HGB 11.1*   < > 9.4*   < > 11.6*   < > 10.3* 9.6* 8.7*  HCT 35*   < > 29*   < > 36   < > 34* 32* 28*  PLT 312   < > 224   < > 461*   < > 351 460* 439*   < > = values in this interval not displayed.   Lab Results  Component Value Date   TSH 1.71 08/01/2018   Lab Results  Component Value Date   HGBA1C 5.7 08/01/2018   Lab Results  Component Value Date   CHOL 140 08/01/2018   HDL 46 08/01/2018   LDLCALC 80  08/01/2018   TRIG 82 08/01/2018   CHOLHDL 2.4 03/03/2012    Significant Diagnostic Results in last 30 days:  No results found.  Assessment/Plan 1. Essential hypertension B/p stable.  - continue on amlodipine 10 mg tablet daily and Losartan 50 mg tablet daily at bedtime. - latest CR at baseline.  2. Restless leg syndrome Continue on ropinirole 1 mg tablet twice daily.  3. PMR (polymyalgia rheumatica) (HCC) Continue on prednisone 1 mg tablet daily  - continue on Tramadol 50 mg tablet twice daily and Tylenol 650 mg tablet every 6 hrs PRN for pain.   4. Generalized anxiety disorder Stable.Coloring her book during visit.  - continue on alprazolam 0.25 mg tablet daily   5. Late onset Alzheimer's disease without behavioral disturbance ([place]) No behavioral issues reported. - continue with supportive care  - continue on Aricept 10 mg tablet at bedtime.Wt stable.  6. History of pulmonary embolus (PE) No shortness of breath noted. Continue  on Warfarin 2.5 mg tablet daily.  7. Long term current use of anticoagulant On Warfarin 2.5 mg tablet daily. Latest INR therapeutic 2.1 next INR to be recheck 09/16/2020 per Dr.Reed orders.  - No signs of bleeding.  - continue to monitor H/H   Family/ staff Communication: Plan of care reviewed with patient and facility Nurse.   Labs/tests ordered: None

## 2020-09-10 NOTE — Progress Notes (Signed)
Prescription sent

## 2020-09-23 DIAGNOSIS — Z86718 Personal history of other venous thrombosis and embolism: Secondary | ICD-10-CM | POA: Diagnosis not present

## 2020-09-24 ENCOUNTER — Non-Acute Institutional Stay (SKILLED_NURSING_FACILITY): Payer: Medicare Other | Admitting: Orthopedic Surgery

## 2020-09-24 ENCOUNTER — Encounter: Payer: Self-pay | Admitting: Orthopedic Surgery

## 2020-09-24 DIAGNOSIS — I10 Essential (primary) hypertension: Secondary | ICD-10-CM | POA: Diagnosis not present

## 2020-09-24 DIAGNOSIS — Z7901 Long term (current) use of anticoagulants: Secondary | ICD-10-CM | POA: Diagnosis not present

## 2020-09-24 DIAGNOSIS — Z86711 Personal history of pulmonary embolism: Secondary | ICD-10-CM

## 2020-09-24 NOTE — Progress Notes (Signed)
Location:    [facility] Room Number: 204/D Place of Service:  SNF (31) Provider:  Windell Moulding NP  [name], DO  Patient Care Team: [name], DO as PCP - General (Geriatric Medicine) Rehab, [place] ([place])  Extended Emergency Contact Information Primary Emergency Contact: Carbonell,[name] Address: [place], [place] [address] [name] of [place] Phone: [phone] Mobile Phone: [phone] Relation: Son Secondary Emergency Contact: [place] of [place] Phone: [phone] Relation: Daughter  Code Status:  DNR Goals of care: Advanced Directive information Advanced Directives 09/24/2020  Does Patient Have a Medical Advance Directive? Yes  Type of Paramedic of [place];Living will;Out of facility DNR (pink MOST or yellow form)  Does patient want to make changes to medical advance directive? No - Patient declined  Copy of [place] in Chart? Yes - validated most recent copy scanned in chart (See row information)  Pre-existing out of facility DNR order (yellow form or pink MOST form) Yellow form placed in chart (order not valid for inpatient use)     Chief Complaint  Patient presents with  . Anticoagulation    Low INR    HPI:  Pt is a 86 y.o. female seen today for an acute visit for INR of 1.9. She is a resident of [place], seen today at bedside. PMH includes: hypertension, coronary artery disease, history of pulmonary embolus, and dementia. Her INR today was reported to be 1.9 on 2 mg of coumadin daily. On call NP, gave orders for Coumadin 2.5 mg one time dose 09/23/20.  In the past few weeks she has had many adjustments to her daily coumadin regimen in an effort to reach a goal of INR 2.0-3.0. Facility nurse also states her appetite is poor and does not know her daily intake  of foods rich in vitamin K. Nursing staff does not report any signs of bleeding at this time.   Today, she is very pleasant when visited. Family member present during exam. She cannot recall what she ate a few hours ago. She denies trouble breathing or chest pain when asked, wearing 2LNC during exam.    Past Medical History:  Diagnosis Date  . Anemia 03/02/2012  . Angina pectoris, unstable ([place]) 03/02/2012  . Anxiety   . Axillary abscess   . Bradycardia 06/13/2012  . CAD (coronary artery disease)    S/p PTCA / stenting (last cath 2004, multiple LAD stents, 2 stents in the right coronary artery all patent)  . Cancer ([place])   . Complication of anesthesia   . Compression fracture of L2 ([place]) 03/12/2017  . Dementia ([place])   . Dysphagia 10/06/2012  . Dyspnea 02/03/2012  . GERD (gastroesophageal reflux disease)   . History of pulmonary embolus (PE) 03/19/2017  . HTN (hypertension)   . PMR (polymyalgia rheumatica) (HCC)   . PONV (postoperative nausea and vomiting)   . Restless leg syndrome   . 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 Started coumadin  . Weakness of both legs 06/09/2012   Past Surgical History:  Procedure Laterality Date  . BREAST LUMPECTOMY    . COLECTOMY    . ESOPHAGOGASTRODUODENOSCOPY (EGD) WITH ESOPHAGEAL DILATION  10/10/2012   Procedure: ESOPHAGOGASTRODUODENOSCOPY (EGD) WITH ESOPHAGEAL DILATION;  Surgeon: Inda Castle, MD;  Location: [facility];  Service: Endoscopy;  Laterality: N/A;  . FEMUR IM NAIL Left 12/13/2018   Procedure: INTRAMEDULLARY (IM) NAIL FEMORAL LEFT;  Surgeon: Paralee Cancel, MD;  Location: [facility];  Service: Orthopedics;  Laterality: Left;  . heart stents  x 8  . INCISION AND DRAINAGE     bilateral axillary, non specific staff  . INCISION AND DRAINAGE ABSCESS  09/28/2012   Procedure: INCISION AND DRAINAGE ABSCESS;  Surgeon: Zenovia Jarred, MD;  Location: [facility];  Service: General;  Laterality: Bilateral;  . stent     cardiac x 8 stents.     Allergies  Allergen Reactions  . Ambien [Zolpidem Tartrate] Nausea And Vomiting  . Codeine   . Codeine Phosphate Nausea And Vomiting  . Darvocet [Propoxyphene N-Acetaminophen] Nausea And Vomiting  . Fish Allergy   . Fish-Derived Products   . Hydrocodone   . Other     Chocolate Flavor.   . Promethazine   . Promethazine Hcl Nausea And Vomiting  . Sulfamethoxazole Other (See Comments)    Pt doesn't remember reaction  . Vicodin [Hydrocodone-Acetaminophen] Other (See Comments)    Unknown reaction  . Amoxicillin Nausea And Vomiting and Rash  . Penicillins Nausea And Vomiting and Rash    Allergies as of 09/24/2020      Reactions   Ambien [zolpidem Tartrate] Nausea And Vomiting   Codeine    Codeine Phosphate Nausea And Vomiting   Darvocet [propoxyphene N-acetaminophen] Nausea And Vomiting   Fish Allergy    Fish-derived Products    Hydrocodone    Other    Chocolate Flavor.    Promethazine    Promethazine Hcl Nausea And Vomiting   Sulfamethoxazole Other (See Comments)   Pt doesn't remember reaction   Vicodin [hydrocodone-acetaminophen] Other (See Comments)   Unknown reaction   Amoxicillin Nausea And Vomiting, Rash   Penicillins Nausea And Vomiting, Rash      Medication List       Accurate as of September 24, 2020  4:21 PM. If you have any questions, ask your nurse or doctor.        acetaminophen 325 MG tablet Commonly known as: TYLENOL Take 650 mg by mouth every 6 (six) hours as needed for mild pain, moderate pain or fever.   ALPRAZolam 0.5 MG tablet Commonly known as: XANAX Take 0.5 tablets (0.25 mg total) by mouth daily.   amLODipine 10 MG tablet Commonly known as: NORVASC Take 10 mg by mouth every morning.   donepezil 10 MG tablet Commonly known as: ARICEPT Take 10 mg by mouth at bedtime.   Doxepin HCl 3 MG Tabs Take 3 mg by mouth at bedtime.   losartan 50 MG tablet Commonly known as: COZAAR Take 50 mg by mouth at bedtime.   nitroGLYCERIN 0.4 MG  SL tablet Commonly known as: NITROSTAT Place 0.4 mg under the tongue every 5 (five) minutes as needed for chest pain.   NON FORMULARY Diet Order: Mech Soft Liberalized Diet.   NUTRITIONAL SUPPLEMENT PO Take 1 each by mouth 2 (two) times a day. Magic Cup - take with lunch and dinner   ondansetron 4 MG tablet Commonly known as: ZOFRAN Take 4 mg by mouth every 4 (four) hours as needed for nausea.   OXYGEN Inhale 2 L/min into the lungs as needed. Wean as able to keep SPO2 greater than 92%   polyethylene glycol 17 g packet Commonly known as: MIRALAX / GLYCOLAX Take 17 g by mouth daily.   predniSONE 1 MG tablet Commonly known as: DELTASONE Take 1  mg by mouth daily with breakfast.   ProAir HFA 108 (90 Base) MCG/ACT inhaler Generic drug: albuterol Inhale 2 puffs into the lungs every 6 (six) hours as needed for wheezing or shortness of breath.   rOPINIRole 2 MG tablet Commonly known as: REQUIP Take 2 mg by mouth. every evening .2 hours before bedtime   rOPINIRole 1 MG tablet Commonly known as: REQUIP 1 mg. Take 1 tablet by mouth twice daily at lunch and at bedtime.   senna-docusate 8.6-50 MG tablet Commonly known as: Senokot-S Take 1 tablet by mouth 2 (two) times daily.   traMADol 50 MG tablet Commonly known as: ULTRAM Take 1 tablet (50 mg total) by mouth 2 (two) times daily.   Tums 500 MG chewable tablet Generic drug: calcium carbonate Chew 1 tablet by mouth 3 (three) times daily as needed for indigestion or heartburn.   Vitamin D3 1.25 MG (50000 UT) Caps Take 1 capsule by mouth once a week.   warfarin 2.5 MG tablet Commonly known as: COUMADIN Take 2.5 mg by mouth daily.       Review of Systems  Unable to perform ROS: Dementia  HENT: Positive for hearing loss.   Respiratory:       Oxygen use  Psychiatric/Behavioral: Positive for confusion.    Immunization History  Administered Date(s) Administered  . Influenza-Unspecified 08/01/2016, 08/11/2017,  07/02/2018, 08/02/2019, 08/19/2020  . Moderna SARS-COVID-2 Vaccination 11/01/2019, 11/29/2019, 09/18/2020  . PPD Test 03/18/2017  . Pneumococcal Conjugate-13 09/17/2017  . Pneumococcal Polysaccharide-23 04/17/2019   Pertinent  Health Maintenance Due  Topic Date Due  . INFLUENZA VACCINE  Completed  . DEXA SCAN  Completed  . PNA vac Low Risk Adult  Completed   Fall Risk  03/14/2018  Falls in the past year? No   Functional Status Survey:    Vitals:   09/24/20 1613  BP: 131/65  Pulse: 61  Resp: 18  Temp: (!) 97.3 F (36.3 C)  SpO2: 96%  Weight: 140 lb (63.5 kg)  Height: 5\' 1"  (1.549 m)   Body mass index is 26.45 kg/m. Physical Exam Constitutional:      General: She is not in acute distress. Cardiovascular:     Rate and Rhythm: Normal rate and regular rhythm.     Pulses: Normal pulses.     Heart sounds: Normal heart sounds. No murmur heard.   Pulmonary:     Effort: Pulmonary effort is normal. No respiratory distress.     Breath sounds: Normal breath sounds. No wheezing.     Comments: 2LNC Chest:     Chest wall: No tenderness.  Musculoskeletal:     Right lower leg: No edema.     Left lower leg: No edema.  Skin:    Capillary Refill: Capillary refill takes less than 2 seconds.  Neurological:     Mental Status: She is alert. She is disoriented.  Psychiatric:        Mood and Affect: Mood normal.        Behavior: Behavior normal.        Cognition and Memory: Memory is impaired.     Labs reviewed: Recent Labs    05/06/20 0000 07/08/20 0000 07/16/20 0000  NA 137 133* 140  K 4.7 4.5 3.7  CL 104 98* 103  CO2 21 22 23*  BUN 27* 18 23*  CREATININE 0.9 0.9 1.0  CALCIUM 8.9 9.2 9.3   No results for input(s): AST, ALT, ALKPHOS, BILITOT, PROT, ALBUMIN in the last 8760 hours. Recent Labs  11/23/19 0000 11/23/19 0000 11/26/19 0000 11/26/19 0000 12/03/19 0000 04/10/20 0000 07/08/20 0000 07/16/20 0000 07/17/20 0000  WBC 18.2   < > 14.7   < > 8.9   < > 8.1  15.1 10.3  NEUTROABS 18  --  12  --  7  --   --   --   --   HGB 11.1*   < > 9.4*   < > 11.6*   < > 10.3* 9.6* 8.7*  HCT 35*   < > 29*   < > 36   < > 34* 32* 28*  PLT 312   < > 224   < > 461*   < > 351 460* 439*   < > = values in this interval not displayed.   Lab Results  Component Value Date   TSH 1.71 08/01/2018   Lab Results  Component Value Date   HGBA1C 5.7 08/01/2018   Lab Results  Component Value Date   CHOL 140 08/01/2018   HDL 46 08/01/2018   LDLCALC 80 08/01/2018   TRIG 82 08/01/2018   CHOLHDL 2.4 03/03/2012    Significant Diagnostic Results in last 30 days:  No results found.  Assessment/Plan 1. Long term current use of anticoagulant - current INR 1.9 on 2 mg of coumadin, on call NP gave one time dose of 2.5 mg 09/23/2020 - no signs of bleeding on exam, breathing unlabored on [facility] - suspect her daily intake of foods rich in vitamin K are inconsistent and may explain fluctuation in INR - will increase coumadin to 2.5 mg PO daily - recheck PT/INR 09/29/2020 - recommend limiting vitamin K foods in her diet  - plan to discuss NOAC's next routine visit with family   2. History of pulmonary embolus (PE) - same as above    Family/ staff Communication: Plan discussed with facility nurse and patient  Labs/tests ordered:  PT/INR 09/29/2020

## 2020-09-29 DIAGNOSIS — Z7901 Long term (current) use of anticoagulants: Secondary | ICD-10-CM | POA: Diagnosis not present

## 2020-10-02 ENCOUNTER — Other Ambulatory Visit: Payer: Self-pay | Admitting: Orthopedic Surgery

## 2020-10-02 DIAGNOSIS — H35033 Hypertensive retinopathy, bilateral: Secondary | ICD-10-CM | POA: Diagnosis not present

## 2020-10-02 DIAGNOSIS — F411 Generalized anxiety disorder: Secondary | ICD-10-CM

## 2020-10-02 DIAGNOSIS — H43813 Vitreous degeneration, bilateral: Secondary | ICD-10-CM | POA: Diagnosis not present

## 2020-10-02 DIAGNOSIS — H04123 Dry eye syndrome of bilateral lacrimal glands: Secondary | ICD-10-CM | POA: Diagnosis not present

## 2020-10-02 DIAGNOSIS — H353131 Nonexudative age-related macular degeneration, bilateral, early dry stage: Secondary | ICD-10-CM | POA: Diagnosis not present

## 2020-10-02 MED ORDER — ALPRAZOLAM 0.5 MG PO TABS: 0.2500 mg | ORAL_TABLET | Freq: Every day | ORAL | 0 refills | Status: DC

## 2020-10-06 DIAGNOSIS — Z86718 Personal history of other venous thrombosis and embolism: Secondary | ICD-10-CM | POA: Diagnosis not present

## 2020-10-07 ENCOUNTER — Other Ambulatory Visit: Payer: Self-pay | Admitting: Orthopedic Surgery

## 2020-10-07 DIAGNOSIS — M353 Polymyalgia rheumatica: Secondary | ICD-10-CM

## 2020-10-07 MED ORDER — TRAMADOL HCL 50 MG PO TABS: 50.0000 mg | ORAL_TABLET | Freq: Two times a day (BID) | ORAL | 0 refills | Status: DC

## 2020-10-13 ENCOUNTER — Encounter: Payer: Self-pay | Admitting: Orthopedic Surgery

## 2020-10-13 ENCOUNTER — Non-Acute Institutional Stay (SKILLED_NURSING_FACILITY): Payer: Medicare Other | Admitting: Orthopedic Surgery

## 2020-10-13 DIAGNOSIS — F411 Generalized anxiety disorder: Secondary | ICD-10-CM

## 2020-10-13 DIAGNOSIS — F0280 Dementia in other diseases classified elsewhere without behavioral disturbance: Secondary | ICD-10-CM

## 2020-10-13 DIAGNOSIS — H6123 Impacted cerumen, bilateral: Secondary | ICD-10-CM

## 2020-10-13 DIAGNOSIS — M353 Polymyalgia rheumatica: Secondary | ICD-10-CM

## 2020-10-13 DIAGNOSIS — I10 Essential (primary) hypertension: Secondary | ICD-10-CM

## 2020-10-13 DIAGNOSIS — Z7901 Long term (current) use of anticoagulants: Secondary | ICD-10-CM | POA: Diagnosis not present

## 2020-10-13 DIAGNOSIS — Z86711 Personal history of pulmonary embolism: Secondary | ICD-10-CM | POA: Diagnosis not present

## 2020-10-13 DIAGNOSIS — Z86718 Personal history of other venous thrombosis and embolism: Secondary | ICD-10-CM | POA: Diagnosis not present

## 2020-10-13 DIAGNOSIS — G301 Alzheimer's disease with late onset: Secondary | ICD-10-CM

## 2020-10-13 NOTE — Progress Notes (Signed)
Location:    [facility] Room Number: 204/D Place of Service:  SNF (31) Provider:  Lavonte Palos AGNP-C  [name], DO  Patient Care Team: [name], DO as PCP - General (Geriatric Medicine) Rehab, [place] ([facility] [place])  Extended Emergency Contact Information Primary Emergency Contact: Boodram,[name] Address: [place], [place] [address] [name] of [place] Phone: [phone] Mobile Phone: [phone] Relation: Son Secondary Emergency Contact: [place] of [place] Phone: [phone] Relation: Daughter  Code Status:  DNR Goals of care: Advanced Directive information Advanced Directives 10/13/2020  Does Patient Have a Medical Advance Directive? Yes  Type of Paramedic of [place];Living will;Out of facility DNR (pink MOST or yellow form)  Does patient want to make changes to medical advance directive? No - Patient declined  Copy of [place] in Chart? Yes - validated most recent copy scanned in chart (See row information)  Pre-existing out of facility DNR order (yellow form or pink MOST form) Yellow form placed in chart (order not valid for inpatient use)     Chief Complaint  Patient presents with  . Medical Management of Chronic Issues    Routine Visit of Medical Management     HPI:  Pt is a 86 y.o. female seen today for medical management of chronic diseases.    She is a resident of [facility], seen at bedside today. PMH includes: hypertension, CAD, venous thromboembolism, unstable angina, dysphagia, dementia, overactive bladder, moderate protein malnutrition and chronic kidney disease stage III.   Today, she is sitting in bed coloring. She is alert to self, person, and situation. [place] name facility she is at or year. Follows commands and can express needs. Her room is covered  with various pictures she has colored. Her daughter brings in new coloring books and crayons often. On her bedside table, she had a unopened Nurse, mental health delivered to her. She opened it, and it ws more coloring supplied from family.   Recorded blood pressures are as follows:  12/13- 139/53  12/12- 134/66  12/11- 132/67  Recorded weights are as follows:   11/12- 140 lbs  10/01-139.4 lbs  09/21- 137.4 lbs Next weight to be done in the middle of the month. She is still receiving magic cup BID for additional calories. She states she is not very hungry, will only take a couple bites of each meal and then feels full. Thinks she drinks fluids well. Water pitcher and cup of water present on side table. Denies any trouble swallowing.   In the past few weeks she has had her coumadin adjusted numerous times. Remains on coumadin for history for DVT and PE. Currently taking 2.5 mg coumadin daily. Next INR check scheduled 12/22. Facility nurse denies signs of bleeding.   Continues to need assistance with transfers. Uses wheelchair to ambulate. No reported falls, injuries or hospitalizations.   Facility nurse does not report any concerns, vitals stable. No recent behavioral outbursts reported.   Received Moderna booster vaccine 11/18- no reactions documented.      Past Medical History:  Diagnosis Date  . Anemia 03/02/2012  . Angina pectoris, unstable ([place]) 03/02/2012  . Anxiety   . Axillary abscess   . Bradycardia 06/13/2012  . CAD (coronary artery disease)    S/p PTCA / stenting (last cath 2004, multiple LAD stents, 2  stents in the right coronary artery all patent)  . Cancer ([place])   . Complication of anesthesia   . Compression fracture of L2 ([place]) 03/12/2017  . Dementia ([place])   . Dysphagia 10/06/2012  . Dyspnea 02/03/2012  . GERD (gastroesophageal reflux disease)   . History of pulmonary embolus (PE) 03/19/2017  . HTN (hypertension)   . PMR (polymyalgia rheumatica) (HCC)   . PONV (postoperative  nausea and vomiting)   . Restless leg syndrome   . 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 Started coumadin  . Weakness of both legs 06/09/2012   Past Surgical History:  Procedure Laterality Date  . BREAST LUMPECTOMY    . COLECTOMY    . ESOPHAGOGASTRODUODENOSCOPY (EGD) WITH ESOPHAGEAL DILATION  10/10/2012   Procedure: ESOPHAGOGASTRODUODENOSCOPY (EGD) WITH ESOPHAGEAL DILATION;  Surgeon: Inda Castle, MD;  Location: [facility];  Service: Endoscopy;  Laterality: N/A;  . FEMUR IM NAIL Left 12/13/2018   Procedure: INTRAMEDULLARY (IM) NAIL FEMORAL LEFT;  Surgeon: Paralee Cancel, MD;  Location[facility];  Service: Orthopedics;  Laterality: Left;  . heart stents  x 8  . INCISION AND DRAINAGE     bilateral axillary, non specific staff  . INCISION AND DRAINAGE ABSCESS  09/28/2012   Procedure: INCISION AND DRAINAGE ABSCESS;  Surgeon: Zenovia Jarred, MD;  Location: [facility];  Service: General;  Laterality: Bilateral;  . stent     cardiac x 8 stents.    Allergies  Allergen Reactions  . Ambien [Zolpidem Tartrate] Nausea And Vomiting  . Codeine   . Codeine Phosphate Nausea And Vomiting  . Darvocet [Propoxyphene N-Acetaminophen] Nausea And Vomiting  . Fish Allergy   . Fish-Derived Products   . Hydrocodone   . Other     Chocolate Flavor.   . Promethazine   . Promethazine Hcl Nausea And Vomiting  . Sulfamethoxazole Other (See Comments)    Pt doesn't remember reaction  . Vicodin [Hydrocodone-Acetaminophen] Other (See Comments)    Unknown reaction  . Amoxicillin Nausea And Vomiting and Rash  . Penicillins Nausea And Vomiting and Rash    Allergies as of 10/13/2020      Reactions   Ambien [zolpidem Tartrate] Nausea And Vomiting   Codeine    Codeine Phosphate Nausea And Vomiting   Darvocet [propoxyphene N-acetaminophen] Nausea And Vomiting   Fish Allergy    Fish-derived Products    Hydrocodone    Other    Chocolate Flavor.    Promethazine    Promethazine Hcl Nausea And  Vomiting   Sulfamethoxazole Other (See Comments)   Pt doesn't remember reaction   Vicodin [hydrocodone-acetaminophen] Other (See Comments)   Unknown reaction   Amoxicillin Nausea And Vomiting, Rash   Penicillins Nausea And Vomiting, Rash      Medication List       Accurate as of October 13, 2020  9:52 AM. If you have any questions, ask your nurse or doctor.        acetaminophen 325 MG tablet Commonly known as: TYLENOL Take 650 mg by mouth every 6 (six) hours as needed for mild pain, moderate pain or fever.   albuterol 108 (90 Base) MCG/ACT inhaler Commonly known as: VENTOLIN HFA Inhale 2 puffs into the lungs every 6 (six) hours as needed for wheezing or shortness of breath.   ALPRAZolam 0.5 MG tablet Commonly known as: XANAX Take 0.5 tablets (0.25 mg total) by mouth daily.   amLODipine 10 MG tablet Commonly known as: NORVASC Take 10 mg by mouth  every morning.   calcium carbonate 500 MG chewable tablet Commonly known as: TUMS - dosed in mg elemental calcium Chew 1 tablet by mouth 3 (three) times daily as needed for indigestion or heartburn.   donepezil 10 MG tablet Commonly known as: ARICEPT Take 10 mg by mouth at bedtime.   Doxepin HCl 3 MG Tabs Take 3 mg by mouth at bedtime.   losartan 50 MG tablet Commonly known as: COZAAR Take 50 mg by mouth at bedtime.   nitroGLYCERIN 0.4 MG SL tablet Commonly known as: NITROSTAT Place 0.4 mg under the tongue every 5 (five) minutes as needed for chest pain.   NON FORMULARY Diet Order: Mech Soft Liberalized Diet.   NUTRITIONAL SUPPLEMENT PO Take 1 Container by mouth 2 (two) times a day. Magic Cup - take with lunch and dinner   ondansetron 4 MG tablet Commonly known as: ZOFRAN Take 4 mg by mouth every 4 (four) hours as needed for nausea.   OXYGEN Inhale 2 L/min into the lungs as needed. Wean as able to keep SPO2 greater than 92%   polyethylene glycol 17 g packet Commonly known as: MIRALAX / GLYCOLAX Take 17 g by  mouth daily.   predniSONE 1 MG tablet Commonly known as: DELTASONE Take 1 mg by mouth daily with breakfast.   rOPINIRole 2 MG tablet Commonly known as: REQUIP Take 2 mg by mouth. every evening .2 hours before bedtime   rOPINIRole 1 MG tablet Commonly known as: REQUIP 1 mg. Take 1 tablet by mouth twice daily at lunch and at bedtime.   senna-docusate 8.6-50 MG tablet Commonly known as: Senokot-S Take 1 tablet by mouth 2 (two) times daily.   traMADol 50 MG tablet Commonly known as: ULTRAM Take 1 tablet (50 mg total) by mouth 2 (two) times daily.   Vitamin D3 1.25 MG (50000 UT) Caps Take 1 capsule by mouth once a week.   warfarin 2.5 MG tablet Commonly known as: COUMADIN Take 2.5 mg by mouth daily.       Review of Systems  Constitutional: Negative for activity change, appetite change and fatigue.  HENT: Positive for hearing loss and trouble swallowing.        Missing teeth  Eyes: Negative for photophobia.  Respiratory: Negative for cough, shortness of breath and wheezing.   Cardiovascular: Negative for chest pain and leg swelling.  Gastrointestinal: Negative for abdominal pain, blood in stool, constipation, diarrhea and nausea.  Genitourinary: Negative for dysuria and frequency.       Incontinence  Musculoskeletal: Positive for arthralgias and myalgias.  Skin:       Thin skin  Neurological: Positive for weakness. Negative for dizziness and numbness.  Hematological: Bruises/bleeds easily.  Psychiatric/Behavioral: Positive for confusion. Negative for dysphoric mood and sleep disturbance. The patient is not nervous/anxious.     Immunization History  Administered Date(s) Administered  . Influenza-Unspecified 08/01/2016, 08/11/2017, 07/02/2018, 08/02/2019, 08/19/2020  . Moderna Sars-Covid-2 Vaccination 11/01/2019, 11/29/2019, 09/18/2020  . PPD Test 03/18/2017  . Pneumococcal Conjugate-13 09/17/2017  . Pneumococcal Polysaccharide-23 04/17/2019   Pertinent  Health  Maintenance Due  Topic Date Due  . INFLUENZA VACCINE  Completed  . DEXA SCAN  Completed  . PNA vac Low Risk Adult  Completed   Fall Risk  03/14/2018  Falls in the past year? No   Functional Status Survey:    Vitals:   10/13/20 0943  BP: 134/66  Pulse: 69  Resp: 18  Temp: (!) 97.2 F (36.2 C)  SpO2: 96%  Weight:  140 lb (63.5 kg)  Height: 5\' 1"  (1.549 m)   Body mass index is 26.45 kg/m. Physical Exam Vitals reviewed.  Constitutional:      General: She is not in acute distress.    Appearance: Normal appearance. She is normal weight.  HENT:     Head: Normocephalic.     Right Ear: There is impacted cerumen.     Left Ear: There is impacted cerumen.     Nose: Nose normal.     Mouth/Throat:     Mouth: Mucous membranes are moist.  Eyes:     General:        Right eye: No discharge.        Left eye: No discharge.     Extraocular Movements: Extraocular movements intact.     Pupils: Pupils are equal, round, and reactive to light.  Cardiovascular:     Rate and Rhythm: Normal rate and regular rhythm.     Pulses: Normal pulses.     Heart sounds: Murmur heard.    Pulmonary:     Effort: Pulmonary effort is normal. No respiratory distress.     Breath sounds: Normal breath sounds. No wheezing.  Abdominal:     General: Abdomen is flat. Bowel sounds are normal. There is no distension.     Palpations: Abdomen is soft.     Tenderness: There is no abdominal tenderness.  Musculoskeletal:     Cervical back: Normal range of motion.     Right lower leg: No edema.     Left lower leg: No edema.  Lymphadenopathy:     Cervical: No cervical adenopathy.  Skin:    General: Skin is warm and dry.     Capillary Refill: Capillary refill takes less than 2 seconds.  Neurological:     General: No focal deficit present.     Mental Status: She is alert. Mental status is at baseline.     Motor: Weakness present.     Gait: Gait abnormal.  Psychiatric:        Mood and Affect: Mood normal.         Behavior: Behavior normal.        Cognition and Memory: Memory is impaired.     Labs reviewed: Recent Labs    05/06/20 0000 07/08/20 0000 07/16/20 0000  NA 137 133* 140  K 4.7 4.5 3.7  CL 104 98* 103  CO2 21 22 23*  BUN 27* 18 23*  CREATININE 0.9 0.9 1.0  CALCIUM 8.9 9.2 9.3   No results for input(s): AST, ALT, ALKPHOS, BILITOT, PROT, ALBUMIN in the last 8760 hours. Recent Labs    11/23/19 0000 11/26/19 0000 12/03/19 0000 04/10/20 0000 07/08/20 0000 07/16/20 0000 07/17/20 0000  WBC 18.2 14.7 8.9   < > 8.1 15.1 10.3  NEUTROABS 18 12 7   --   --   --   --   HGB 11.1* 9.4* 11.6*   < > 10.3* 9.6* 8.7*  HCT 35* 29* 36   < > 34* 32* 28*  PLT 312 224 461*   < > 351 460* 439*   < > = values in this interval not displayed.   Lab Results  Component Value Date   TSH 1.71 08/01/2018   Lab Results  Component Value Date   HGBA1C 5.7 08/01/2018   Lab Results  Component Value Date   CHOL 140 08/01/2018   HDL 46 08/01/2018   LDLCALC 80 08/01/2018   TRIG 82 08/01/2018   CHOLHDL  2.4 03/03/2012    Significant Diagnostic Results in last 30 days:  No results found.  Assessment/Plan 1. Bilateral impacted cerumen - cannot visualize TM due to wax buildup - debrox 5gtts to left and right ear Q HS x 7 days  2. Essential hypertension - well controlled, continue current regimen - continue to limit salt from diet, <2000 mg daily  3. Long term current use of anticoagulant - INR today, 1.4 - will increase coumadin to 3 mg PO daily - goal INR 2-3 - check INR in 1 week  4. History of pulmonary embolus (PE) - stable with coumadin, continue weekly INR checks  5. PMR (polymyalgia rheumatica) (HCC) -no reports of increased pain - stable with 1 mg prednisone daily  6. Generalized anxiety disorder - no panic reports or increased anxiety - continue daily xanax regimen  7. Late onset Alzheimer's disease without behavioral disturbance ([place]) - no behavioral outbursts at this  time - still able to use wheelchair, color and feed self    Family/ staff Communication: Plan discussed with patient and facility nurse  Labs/tests ordered:  PT/INR in 1 week

## 2020-10-14 ENCOUNTER — Encounter: Payer: Self-pay | Admitting: Internal Medicine

## 2020-10-14 ENCOUNTER — Non-Acute Institutional Stay (SKILLED_NURSING_FACILITY): Payer: Medicare Other | Admitting: Internal Medicine

## 2020-10-14 DIAGNOSIS — F411 Generalized anxiety disorder: Secondary | ICD-10-CM

## 2020-10-14 DIAGNOSIS — G301 Alzheimer's disease with late onset: Secondary | ICD-10-CM | POA: Diagnosis not present

## 2020-10-14 DIAGNOSIS — Z86711 Personal history of pulmonary embolism: Secondary | ICD-10-CM

## 2020-10-14 DIAGNOSIS — I25118 Atherosclerotic heart disease of native coronary artery with other forms of angina pectoris: Secondary | ICD-10-CM | POA: Diagnosis not present

## 2020-10-14 DIAGNOSIS — I10 Essential (primary) hypertension: Secondary | ICD-10-CM | POA: Diagnosis not present

## 2020-10-14 DIAGNOSIS — F0280 Dementia in other diseases classified elsewhere without behavioral disturbance: Secondary | ICD-10-CM

## 2020-10-14 DIAGNOSIS — R0789 Other chest pain: Secondary | ICD-10-CM

## 2020-10-14 NOTE — Progress Notes (Signed)
Location:  [facility] Room Number: 204/D Place of Service:  SNF [phone]) Provider:  Azari Janssens L. Mariea Clonts, D.O., C.M.D.  [name], DO  Patient Care Team: [name], DO as PCP - General (Geriatric Medicine) Rehab, [facility] ([place])  Extended Emergency Contact Information Primary Emergency Contact: Ocallaghan,[name] Address: [address]          [place], [place] [address] [name] of [place] Phone: [phone] Mobile Phone: [phone] Relation: Son Secondary Emergency Contact: [place] of [place] Phone: [phone] Relation: Daughter  Code Status: DNR  Goals of care: Advanced Directive information Advanced Directives 10/14/2020  Does Patient Have a Medical Advance Directive? Yes  Type of Paramedic of [place];Living will;Out of facility DNR (pink MOST or yellow form)  Does patient want to make changes to medical advance directive? No - Patient declined  Copy of [place] in Chart? Yes - validated most recent copy scanned in chart (See row information)  Pre-existing out of facility DNR order (yellow form or pink MOST form) Yellow form placed in chart (order not valid for inpatient use)     Chief Complaint  Patient presents with  . Medical Management of Chronic Issues    Routine Visit of Medical Management   . Immunizations    T-Dap    HPI:  Pt is a 86 y.o. female seen today for medical management of chronic diseases.    She is doing ok.  She had a spell of chest pressure 2 nights ago by her report and her roommate's.  This was relieved with use of her albuterol inhaler.   She has not had swelling recently.  She's wearing her oxygen though it's challenging to get it to stay in her nose.  She denies pain at this time.  Her bowels are moving fine.  She's sleeping ok at night.  Her spirits are good.  Her vitals are taken with  an automatic wrist cuff.    Nursing reported no concerns to me about her.    Past Medical History:  Diagnosis Date  . Anemia 03/02/2012  . Angina pectoris, unstable ([place]) 03/02/2012  . Anxiety   . Axillary abscess   . Bradycardia 06/13/2012  . CAD (coronary artery disease)    S/p PTCA / stenting (last cath 2004, multiple LAD stents, 2 stents in the right coronary artery all patent)  . Cancer ([place])   . Complication of anesthesia   . Compression fracture of L2 ([place]) 03/12/2017  . Dementia ([place])   . Dysphagia 10/06/2012  . Dyspnea 02/03/2012  . GERD (gastroesophageal reflux disease)   . History of pulmonary embolus (PE) 03/19/2017  . HTN (hypertension)   . PMR (polymyalgia rheumatica) (HCC)   . PONV (postoperative nausea and vomiting)   . Restless leg syndrome   . 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 Started coumadin  . Weakness of both legs 06/09/2012   Past Surgical History:  Procedure Laterality Date  . BREAST LUMPECTOMY    . COLECTOMY    . ESOPHAGOGASTRODUODENOSCOPY (EGD) WITH ESOPHAGEAL DILATION  10/10/2012   Procedure: ESOPHAGOGASTRODUODENOSCOPY (EGD) WITH ESOPHAGEAL DILATION;  Surgeon: Inda Castle, MD;  Location: [facility];  Service: Endoscopy;  Laterality: N/A;  . FEMUR IM NAIL Left 12/13/2018   Procedure: INTRAMEDULLARY (IM) NAIL FEMORAL LEFT;  Surgeon: Paralee Cancel, MD;  Location: [facility];  Service: Orthopedics;  Laterality: Left;  .  heart stents  x 8  . INCISION AND DRAINAGE     bilateral axillary, non specific staff  . INCISION AND DRAINAGE ABSCESS  09/28/2012   Procedure: INCISION AND DRAINAGE ABSCESS;  Surgeon: Zenovia Jarred, MD;  Location: [facility];  Service: General;  Laterality: Bilateral;  . stent     cardiac x 8 stents.    Allergies  Allergen Reactions  . Ambien [Zolpidem Tartrate] Nausea And Vomiting  . Codeine   . Codeine Phosphate Nausea And Vomiting  . Darvocet [Propoxyphene N-Acetaminophen] Nausea And Vomiting  . Fish Allergy   .  Fish-Derived Products   . Hydrocodone   . Other     Chocolate Flavor.   . Promethazine   . Promethazine Hcl Nausea And Vomiting  . Sulfamethoxazole Other (See Comments)    Pt doesn't remember reaction  . Vicodin [Hydrocodone-Acetaminophen] Other (See Comments)    Unknown reaction  . Amoxicillin Nausea And Vomiting and Rash  . Penicillins Nausea And Vomiting and Rash    Outpatient Encounter Medications as of 10/14/2020  Medication Sig  . acetaminophen (TYLENOL) 325 MG tablet Take 650 mg by mouth every 6 (six) hours as needed for mild pain, moderate pain or fever.  Marland Kitchen albuterol (VENTOLIN HFA) 108 (90 Base) MCG/ACT inhaler Inhale 2 puffs into the lungs every 6 (six) hours as needed for wheezing or shortness of breath.  . ALPRAZolam (XANAX) 0.5 MG tablet Take 0.5 tablets (0.25 mg total) by mouth daily.  Marland Kitchen amLODipine (NORVASC) 10 MG tablet Take 10 mg by mouth every morning.   . calcium carbonate (TUMS - DOSED IN MG ELEMENTAL CALCIUM) 500 MG chewable tablet Chew 1 tablet by mouth 3 (three) times daily as needed for indigestion or heartburn.  . carbamide peroxide (DEBROX) 6.5 % OTIC solution Place 5 drops into the left ear at bedtime.  . Cholecalciferol (VITAMIN D3) 1.25 MG (50000 UT) CAPS Take 1 capsule by mouth once a week.  . donepezil (ARICEPT) 10 MG tablet Take 10 mg by mouth at bedtime.   . Doxepin HCl 3 MG TABS Take 3 mg by mouth at bedtime. For Sleep  . losartan (COZAAR) 50 MG tablet Take 50 mg by mouth at bedtime.   . nitroGLYCERIN (NITROSTAT) 0.4 MG SL tablet Place 0.4 mg under the tongue every 5 (five) minutes as needed for chest pain.  . NON FORMULARY Diet Order: Mech Soft Liberalized Diet.  . Nutritional Supplements (NUTRITIONAL SUPPLEMENT PO) Take 1 Container by mouth 2 (two) times a day. Magic Cup - take with lunch and dinner  . ondansetron (ZOFRAN) 4 MG tablet Take 4 mg by mouth every 4 (four) hours as needed for nausea.   . OXYGEN Inhale 2 L/min into the lungs as needed. Wean  as able to keep SPO2 greater than 92%  . polyethylene glycol (MIRALAX / GLYCOLAX) packet Take 17 g by mouth daily.  . predniSONE (DELTASONE) 1 MG tablet Take 1 mg by mouth daily with breakfast.   . rOPINIRole (REQUIP) 1 MG tablet 1 mg. Take 1 tablet by mouth twice daily at lunch and at bedtime.  Marland Kitchen rOPINIRole (REQUIP) 2 MG tablet Take 2 mg by mouth. every evening .2 hours before bedtime  . senna-docusate (SENOKOT-S) 8.6-50 MG tablet Take 1 tablet by mouth 2 (two) times daily.  . traMADol (ULTRAM) 50 MG tablet Take 1 tablet (50 mg total) by mouth 2 (two) times daily.  Marland Kitchen warfarin (COUMADIN) 3 MG tablet Take 3 mg by mouth daily.  . [DISCONTINUED]  warfarin (COUMADIN) 2.5 MG tablet Take 2.5 mg by mouth daily.    No facility-administered encounter medications on file as of 10/14/2020.    Review of Systems  Constitutional: Positive for malaise/fatigue. Negative for chills and fever.  HENT: Positive for hearing loss. Negative for congestion and sore throat.   Eyes: Negative for blurred vision.  Respiratory: Positive for wheezing. Negative for cough and shortness of breath.        Chest tightness the other night relieved with albuterol  Cardiovascular: Negative for chest pain, palpitations and leg swelling.  Gastrointestinal: Negative for abdominal pain and constipation.  Genitourinary: Negative for dysuria.  Musculoskeletal: Negative for falls and joint pain.  Skin: Negative for rash.  Neurological: Negative for dizziness and loss of consciousness.  Endo/Heme/Allergies: Bruises/bleeds easily.  Psychiatric/Behavioral: Positive for memory loss. Negative for depression. The patient is nervous/anxious.     Immunization History  Administered Date(s) Administered  . Influenza-Unspecified 08/01/2016, 08/11/2017, 07/02/2018, 08/02/2019, 08/19/2020  . Moderna Sars-Covid-2 Vaccination 11/01/2019, 11/29/2019, 09/18/2020  . PPD Test 03/18/2017  . Pneumococcal Conjugate-13 09/17/2017  . Pneumococcal  Polysaccharide-23 04/17/2019   Pertinent  Health Maintenance Due  Topic Date Due  . INFLUENZA VACCINE  Completed  . DEXA SCAN  Completed  . PNA vac Low Risk Adult  Completed   Fall Risk  03/14/2018  Falls in the past year? No   Functional Status Survey:    Vitals:   10/14/20 0940  BP: (!) 139/53  Pulse: (!) 52  Resp: 18  Temp: (!) 97 F (36.1 C)  SpO2: 95%  Weight: 140 lb (63.5 kg)  Height: 5\' 1"  (1.549 m)   Body mass index is 26.45 kg/m. Physical Exam Vitals reviewed.  Constitutional:      Appearance: Normal appearance.  HENT:     Head: Normocephalic and atraumatic.  Eyes:     Conjunctiva/sclera: Conjunctivae normal.     Pupils: Pupils are equal, round, and reactive to light.  Cardiovascular:     Rate and Rhythm: Normal rate and regular rhythm.  Pulmonary:     Effort: Pulmonary effort is normal.     Breath sounds: No wheezing, rhonchi or rales.     Comments: Wearing O2 via [place] Abdominal:     General: Bowel sounds are normal.     Palpations: Abdomen is soft.     Tenderness: There is no abdominal tenderness.  Musculoskeletal:        General: Normal range of motion.     Cervical back: Neck supple.     Right lower leg: No edema.     Left lower leg: No edema.  Skin:    General: Skin is warm and dry.  Neurological:     General: No focal deficit present.     Mental Status: She is alert.     Motor: Weakness present.     Gait: Gait abnormal.     Comments: Uses wheelchair  Psychiatric:        Mood and Affect: Mood normal.     Labs reviewed: Recent Labs    05/06/20 0000 07/08/20 0000 07/16/20 0000  NA 137 133* 140  K 4.7 4.5 3.7  CL 104 98* 103  CO2 21 22 23*  BUN 27* 18 23*  CREATININE 0.9 0.9 1.0  CALCIUM 8.9 9.2 9.3   No results for input(s): AST, ALT, ALKPHOS, BILITOT, PROT, ALBUMIN in the last 8760 hours. Recent Labs    11/23/19 0000 11/26/19 0000 12/03/19 0000 04/10/20 0000 07/08/20 0000 07/16/20 0000 07/17/20  0000  WBC 18.2 14.7 8.9    < > 8.1 15.1 10.3  NEUTROABS 18 12 7   --   --   --   --   HGB 11.1* 9.4* 11.6*   < > 10.3* 9.6* 8.7*  HCT 35* 29* 36   < > 34* 32* 28*  PLT 312 224 461*   < > 351 460* 439*   < > = values in this interval not displayed.   Lab Results  Component Value Date   TSH 1.71 08/01/2018   Lab Results  Component Value Date   HGBA1C 5.7 08/01/2018   Lab Results  Component Value Date   CHOL 140 08/01/2018   HDL 46 08/01/2018   LDLCALC 80 08/01/2018   TRIG 82 08/01/2018   CHOLHDL 2.4 03/03/2012    Significant Diagnostic Results in last 30 days:  No results found.  Assessment/Plan 1. Coronary artery disease of native artery of native heart with stable angina pectoris ([place]) -has prn ntg for chest pain  2. Chest tightness -seems this last event was relieved with albuterol inhaler -she also has CAD and has had esophageal stricture and spasms in history  3. Late onset Alzheimer's disease without behavioral disturbance ([place]) -continue SNF support  4. Essential hypertension -bp at goal with current regimen most days -if consistently over 572 systolic, recheck manually and provide use with readings  5. Generalized anxiety disorder -cont current regimen as is, pleasant, calm and sociable at present today  6. History of pulmonary embolus (PE) -continue oxygen therapy for sob  Family/ staff Communication: d/w snf nurse and roommate also provided some assistance with history  Labs/tests ordered:  No new   Glora Hulgan L. Liadan Guizar, D.O. [facility] [address], [place] [address] Cell Phone (Mon-Fri 8am-5pm):  [phone] On Call:  [phone] & follow prompts after 5pm & weekends Office Phone:  [phone] Office Fax:  [phone]

## 2020-10-16 ENCOUNTER — Other Ambulatory Visit: Payer: Self-pay | Admitting: Orthopedic Surgery

## 2020-10-16 DIAGNOSIS — F411 Generalized anxiety disorder: Secondary | ICD-10-CM

## 2020-10-16 MED ORDER — ALPRAZOLAM 0.5 MG PO TABS: 0.2500 mg | ORAL_TABLET | Freq: Every day | ORAL | 0 refills | Status: DC

## 2020-10-20 DIAGNOSIS — Z86718 Personal history of other venous thrombosis and embolism: Secondary | ICD-10-CM | POA: Diagnosis not present

## 2020-10-20 DIAGNOSIS — E039 Hypothyroidism, unspecified: Secondary | ICD-10-CM | POA: Diagnosis not present

## 2020-10-20 DIAGNOSIS — I2699 Other pulmonary embolism without acute cor pulmonale: Secondary | ICD-10-CM | POA: Diagnosis not present

## 2020-10-20 DIAGNOSIS — Z7901 Long term (current) use of anticoagulants: Secondary | ICD-10-CM | POA: Diagnosis not present

## 2020-10-27 ENCOUNTER — Other Ambulatory Visit: Payer: Self-pay | Admitting: Orthopedic Surgery

## 2020-10-27 DIAGNOSIS — M353 Polymyalgia rheumatica: Secondary | ICD-10-CM

## 2020-10-27 DIAGNOSIS — Z7901 Long term (current) use of anticoagulants: Secondary | ICD-10-CM | POA: Diagnosis not present

## 2020-10-27 MED ORDER — TRAMADOL HCL 50 MG PO TABS
50.0000 mg | ORAL_TABLET | Freq: Two times a day (BID) | ORAL | 0 refills | Status: DC
Start: 2020-10-27 — End: 2020-11-12

## 2020-11-04 ENCOUNTER — Other Ambulatory Visit: Payer: Self-pay | Admitting: Internal Medicine

## 2020-11-04 DIAGNOSIS — F411 Generalized anxiety disorder: Secondary | ICD-10-CM

## 2020-11-04 MED ORDER — ALPRAZOLAM 0.5 MG PO TABS: 0.2500 mg | ORAL_TABLET | Freq: Every day | ORAL | 5 refills | Status: DC

## 2020-11-05 DIAGNOSIS — J9601 Acute respiratory failure with hypoxia: Secondary | ICD-10-CM | POA: Diagnosis not present

## 2020-11-05 DIAGNOSIS — E785 Hyperlipidemia, unspecified: Secondary | ICD-10-CM | POA: Diagnosis not present

## 2020-11-05 DIAGNOSIS — I129 Hypertensive chronic kidney disease with stage 1 through stage 4 chronic kidney disease, or unspecified chronic kidney disease: Secondary | ICD-10-CM | POA: Diagnosis not present

## 2020-11-05 DIAGNOSIS — M353 Polymyalgia rheumatica: Secondary | ICD-10-CM | POA: Diagnosis not present

## 2020-11-05 DIAGNOSIS — R2681 Unsteadiness on feet: Secondary | ICD-10-CM | POA: Diagnosis not present

## 2020-11-05 DIAGNOSIS — G301 Alzheimer's disease with late onset: Secondary | ICD-10-CM | POA: Diagnosis not present

## 2020-11-05 DIAGNOSIS — M6281 Muscle weakness (generalized): Secondary | ICD-10-CM | POA: Diagnosis not present

## 2020-11-06 DIAGNOSIS — E785 Hyperlipidemia, unspecified: Secondary | ICD-10-CM | POA: Diagnosis not present

## 2020-11-06 DIAGNOSIS — J9601 Acute respiratory failure with hypoxia: Secondary | ICD-10-CM | POA: Diagnosis not present

## 2020-11-06 DIAGNOSIS — M353 Polymyalgia rheumatica: Secondary | ICD-10-CM | POA: Diagnosis not present

## 2020-11-06 DIAGNOSIS — M6281 Muscle weakness (generalized): Secondary | ICD-10-CM | POA: Diagnosis not present

## 2020-11-06 DIAGNOSIS — I129 Hypertensive chronic kidney disease with stage 1 through stage 4 chronic kidney disease, or unspecified chronic kidney disease: Secondary | ICD-10-CM | POA: Diagnosis not present

## 2020-11-06 DIAGNOSIS — G301 Alzheimer's disease with late onset: Secondary | ICD-10-CM | POA: Diagnosis not present

## 2020-11-06 DIAGNOSIS — R2681 Unsteadiness on feet: Secondary | ICD-10-CM | POA: Diagnosis not present

## 2020-11-07 DIAGNOSIS — E785 Hyperlipidemia, unspecified: Secondary | ICD-10-CM | POA: Diagnosis not present

## 2020-11-07 DIAGNOSIS — G301 Alzheimer's disease with late onset: Secondary | ICD-10-CM | POA: Diagnosis not present

## 2020-11-07 DIAGNOSIS — I129 Hypertensive chronic kidney disease with stage 1 through stage 4 chronic kidney disease, or unspecified chronic kidney disease: Secondary | ICD-10-CM | POA: Diagnosis not present

## 2020-11-07 DIAGNOSIS — M6281 Muscle weakness (generalized): Secondary | ICD-10-CM | POA: Diagnosis not present

## 2020-11-07 DIAGNOSIS — J9601 Acute respiratory failure with hypoxia: Secondary | ICD-10-CM | POA: Diagnosis not present

## 2020-11-07 DIAGNOSIS — M353 Polymyalgia rheumatica: Secondary | ICD-10-CM | POA: Diagnosis not present

## 2020-11-07 DIAGNOSIS — R2681 Unsteadiness on feet: Secondary | ICD-10-CM | POA: Diagnosis not present

## 2020-11-08 DIAGNOSIS — E785 Hyperlipidemia, unspecified: Secondary | ICD-10-CM | POA: Diagnosis not present

## 2020-11-08 DIAGNOSIS — J9601 Acute respiratory failure with hypoxia: Secondary | ICD-10-CM | POA: Diagnosis not present

## 2020-11-08 DIAGNOSIS — R2681 Unsteadiness on feet: Secondary | ICD-10-CM | POA: Diagnosis not present

## 2020-11-08 DIAGNOSIS — G301 Alzheimer's disease with late onset: Secondary | ICD-10-CM | POA: Diagnosis not present

## 2020-11-08 DIAGNOSIS — I129 Hypertensive chronic kidney disease with stage 1 through stage 4 chronic kidney disease, or unspecified chronic kidney disease: Secondary | ICD-10-CM | POA: Diagnosis not present

## 2020-11-08 DIAGNOSIS — M6281 Muscle weakness (generalized): Secondary | ICD-10-CM | POA: Diagnosis not present

## 2020-11-08 DIAGNOSIS — M353 Polymyalgia rheumatica: Secondary | ICD-10-CM | POA: Diagnosis not present

## 2020-11-10 DIAGNOSIS — G301 Alzheimer's disease with late onset: Secondary | ICD-10-CM | POA: Diagnosis not present

## 2020-11-10 DIAGNOSIS — R2681 Unsteadiness on feet: Secondary | ICD-10-CM | POA: Diagnosis not present

## 2020-11-10 DIAGNOSIS — M6281 Muscle weakness (generalized): Secondary | ICD-10-CM | POA: Diagnosis not present

## 2020-11-10 DIAGNOSIS — J9601 Acute respiratory failure with hypoxia: Secondary | ICD-10-CM | POA: Diagnosis not present

## 2020-11-10 DIAGNOSIS — M353 Polymyalgia rheumatica: Secondary | ICD-10-CM | POA: Diagnosis not present

## 2020-11-10 DIAGNOSIS — E785 Hyperlipidemia, unspecified: Secondary | ICD-10-CM | POA: Diagnosis not present

## 2020-11-10 DIAGNOSIS — I129 Hypertensive chronic kidney disease with stage 1 through stage 4 chronic kidney disease, or unspecified chronic kidney disease: Secondary | ICD-10-CM | POA: Diagnosis not present

## 2020-11-11 DIAGNOSIS — R2681 Unsteadiness on feet: Secondary | ICD-10-CM | POA: Diagnosis not present

## 2020-11-11 DIAGNOSIS — G301 Alzheimer's disease with late onset: Secondary | ICD-10-CM | POA: Diagnosis not present

## 2020-11-11 DIAGNOSIS — I129 Hypertensive chronic kidney disease with stage 1 through stage 4 chronic kidney disease, or unspecified chronic kidney disease: Secondary | ICD-10-CM | POA: Diagnosis not present

## 2020-11-11 DIAGNOSIS — J9601 Acute respiratory failure with hypoxia: Secondary | ICD-10-CM | POA: Diagnosis not present

## 2020-11-11 DIAGNOSIS — E785 Hyperlipidemia, unspecified: Secondary | ICD-10-CM | POA: Diagnosis not present

## 2020-11-11 DIAGNOSIS — M6281 Muscle weakness (generalized): Secondary | ICD-10-CM | POA: Diagnosis not present

## 2020-11-11 DIAGNOSIS — M353 Polymyalgia rheumatica: Secondary | ICD-10-CM | POA: Diagnosis not present

## 2020-11-12 ENCOUNTER — Other Ambulatory Visit: Payer: Self-pay | Admitting: Orthopedic Surgery

## 2020-11-12 DIAGNOSIS — M6281 Muscle weakness (generalized): Secondary | ICD-10-CM | POA: Diagnosis not present

## 2020-11-12 DIAGNOSIS — R2681 Unsteadiness on feet: Secondary | ICD-10-CM | POA: Diagnosis not present

## 2020-11-12 DIAGNOSIS — M353 Polymyalgia rheumatica: Secondary | ICD-10-CM

## 2020-11-12 DIAGNOSIS — G301 Alzheimer's disease with late onset: Secondary | ICD-10-CM | POA: Diagnosis not present

## 2020-11-12 DIAGNOSIS — I129 Hypertensive chronic kidney disease with stage 1 through stage 4 chronic kidney disease, or unspecified chronic kidney disease: Secondary | ICD-10-CM | POA: Diagnosis not present

## 2020-11-12 DIAGNOSIS — E785 Hyperlipidemia, unspecified: Secondary | ICD-10-CM | POA: Diagnosis not present

## 2020-11-12 DIAGNOSIS — J9601 Acute respiratory failure with hypoxia: Secondary | ICD-10-CM | POA: Diagnosis not present

## 2020-11-12 MED ORDER — TRAMADOL HCL 50 MG PO TABS: 50.0000 mg | ORAL_TABLET | Freq: Two times a day (BID) | ORAL | 0 refills | Status: DC

## 2020-11-13 DIAGNOSIS — J9601 Acute respiratory failure with hypoxia: Secondary | ICD-10-CM | POA: Diagnosis not present

## 2020-11-13 DIAGNOSIS — I129 Hypertensive chronic kidney disease with stage 1 through stage 4 chronic kidney disease, or unspecified chronic kidney disease: Secondary | ICD-10-CM | POA: Diagnosis not present

## 2020-11-13 DIAGNOSIS — M353 Polymyalgia rheumatica: Secondary | ICD-10-CM | POA: Diagnosis not present

## 2020-11-13 DIAGNOSIS — E785 Hyperlipidemia, unspecified: Secondary | ICD-10-CM | POA: Diagnosis not present

## 2020-11-13 DIAGNOSIS — G301 Alzheimer's disease with late onset: Secondary | ICD-10-CM | POA: Diagnosis not present

## 2020-11-13 DIAGNOSIS — R2681 Unsteadiness on feet: Secondary | ICD-10-CM | POA: Diagnosis not present

## 2020-11-13 DIAGNOSIS — M6281 Muscle weakness (generalized): Secondary | ICD-10-CM | POA: Diagnosis not present

## 2020-11-14 DIAGNOSIS — G301 Alzheimer's disease with late onset: Secondary | ICD-10-CM | POA: Diagnosis not present

## 2020-11-14 DIAGNOSIS — J9601 Acute respiratory failure with hypoxia: Secondary | ICD-10-CM | POA: Diagnosis not present

## 2020-11-14 DIAGNOSIS — M6281 Muscle weakness (generalized): Secondary | ICD-10-CM | POA: Diagnosis not present

## 2020-11-14 DIAGNOSIS — M353 Polymyalgia rheumatica: Secondary | ICD-10-CM | POA: Diagnosis not present

## 2020-11-14 DIAGNOSIS — E785 Hyperlipidemia, unspecified: Secondary | ICD-10-CM | POA: Diagnosis not present

## 2020-11-14 DIAGNOSIS — I129 Hypertensive chronic kidney disease with stage 1 through stage 4 chronic kidney disease, or unspecified chronic kidney disease: Secondary | ICD-10-CM | POA: Diagnosis not present

## 2020-11-14 DIAGNOSIS — R2681 Unsteadiness on feet: Secondary | ICD-10-CM | POA: Diagnosis not present

## 2020-11-17 DIAGNOSIS — M6281 Muscle weakness (generalized): Secondary | ICD-10-CM | POA: Diagnosis not present

## 2020-11-17 DIAGNOSIS — R2681 Unsteadiness on feet: Secondary | ICD-10-CM | POA: Diagnosis not present

## 2020-11-17 DIAGNOSIS — M353 Polymyalgia rheumatica: Secondary | ICD-10-CM | POA: Diagnosis not present

## 2020-11-17 DIAGNOSIS — J9601 Acute respiratory failure with hypoxia: Secondary | ICD-10-CM | POA: Diagnosis not present

## 2020-11-17 DIAGNOSIS — I129 Hypertensive chronic kidney disease with stage 1 through stage 4 chronic kidney disease, or unspecified chronic kidney disease: Secondary | ICD-10-CM | POA: Diagnosis not present

## 2020-11-17 DIAGNOSIS — E785 Hyperlipidemia, unspecified: Secondary | ICD-10-CM | POA: Diagnosis not present

## 2020-11-17 DIAGNOSIS — G301 Alzheimer's disease with late onset: Secondary | ICD-10-CM | POA: Diagnosis not present

## 2020-11-18 DIAGNOSIS — J9601 Acute respiratory failure with hypoxia: Secondary | ICD-10-CM | POA: Diagnosis not present

## 2020-11-18 DIAGNOSIS — I129 Hypertensive chronic kidney disease with stage 1 through stage 4 chronic kidney disease, or unspecified chronic kidney disease: Secondary | ICD-10-CM | POA: Diagnosis not present

## 2020-11-18 DIAGNOSIS — R2681 Unsteadiness on feet: Secondary | ICD-10-CM | POA: Diagnosis not present

## 2020-11-18 DIAGNOSIS — M353 Polymyalgia rheumatica: Secondary | ICD-10-CM | POA: Diagnosis not present

## 2020-11-18 DIAGNOSIS — G301 Alzheimer's disease with late onset: Secondary | ICD-10-CM | POA: Diagnosis not present

## 2020-11-18 DIAGNOSIS — M6281 Muscle weakness (generalized): Secondary | ICD-10-CM | POA: Diagnosis not present

## 2020-11-18 DIAGNOSIS — E785 Hyperlipidemia, unspecified: Secondary | ICD-10-CM | POA: Diagnosis not present

## 2020-11-19 ENCOUNTER — Encounter: Payer: Self-pay | Admitting: Orthopedic Surgery

## 2020-11-19 ENCOUNTER — Non-Acute Institutional Stay (SKILLED_NURSING_FACILITY): Payer: Medicare Other | Admitting: Orthopedic Surgery

## 2020-11-19 DIAGNOSIS — E785 Hyperlipidemia, unspecified: Secondary | ICD-10-CM | POA: Diagnosis not present

## 2020-11-19 DIAGNOSIS — M353 Polymyalgia rheumatica: Secondary | ICD-10-CM | POA: Diagnosis not present

## 2020-11-19 DIAGNOSIS — M6281 Muscle weakness (generalized): Secondary | ICD-10-CM | POA: Diagnosis not present

## 2020-11-19 DIAGNOSIS — J9601 Acute respiratory failure with hypoxia: Secondary | ICD-10-CM | POA: Diagnosis not present

## 2020-11-19 DIAGNOSIS — R2681 Unsteadiness on feet: Secondary | ICD-10-CM | POA: Diagnosis not present

## 2020-11-19 DIAGNOSIS — R410 Disorientation, unspecified: Secondary | ICD-10-CM

## 2020-11-19 DIAGNOSIS — I129 Hypertensive chronic kidney disease with stage 1 through stage 4 chronic kidney disease, or unspecified chronic kidney disease: Secondary | ICD-10-CM | POA: Diagnosis not present

## 2020-11-19 DIAGNOSIS — G301 Alzheimer's disease with late onset: Secondary | ICD-10-CM | POA: Diagnosis not present

## 2020-11-19 NOTE — Progress Notes (Signed)
Location:  [facility] Room Number: 204/D Place of Service:  SNF (31) Provider:  Windell Moulding, AGNP-C  [name], DO  Patient Care Team: [name], DO as PCP - General (Geriatric Medicine) Rehab, [facility] ([place])  Extended Emergency Contact Information Primary Emergency Contact: Doring,[name] Address: [place], [place] [address] [name] of [place] Phone: [phone] Mobile Phone: [phone] Relation: Son Secondary Emergency Contact: [place] of [place] Phone: [phone] Relation: Daughter  Code Status:  DNR Goals of care: Advanced Directive information Advanced Directives 10/14/2020  Does Patient Have a Medical Advance Directive? Yes  Type of Paramedic of [place];Living will;Out of facility DNR (pink MOST or yellow form)  Does patient want to make changes to medical advance directive? No - Patient declined  Copy of [place] in Chart? Yes - validated most recent copy scanned in chart (See row information)  Pre-existing out of facility DNR order (yellow form or pink MOST form) Yellow form placed in chart (order not valid for inpatient use)     Chief Complaint  Patient presents with  . Acute Visit    Confusion     HPI:  Pt is a 87 y.o. female seen today for an acute visit for confusion.   She is a resident of [facility], seen today at bedside. PMH includes: hypertension, CAD, dysphagia, late onset Alzheimers without behavioral disturbance, CKD stage III, and failure to thrive.   Facility nurse reports increased confusion, slow movement, and back pain. Symptoms noticed today. Roommate also states she is not herself and very talkative. She is alert to self and familiar faces. Disoriented to time, place and situation. Follows some commands, can express needs. At the  beginning of our encounter she is holding the phone to her ear. She thinks she is talking to her son. I gently took away the phone and heard a busy signal. I asked her some questions about her health and she gave inappropriate answers.  No recent falls or injuries.   Facility nurse does not report any other concerns, vitals stable.    Past Medical History:  Diagnosis Date  . Anemia 03/02/2012  . Angina pectoris, unstable ([place]) 03/02/2012  . Anxiety   . Axillary abscess   . Bradycardia 06/13/2012  . CAD (coronary artery disease)    S/p PTCA / stenting (last cath 2004, multiple LAD stents, 2 stents in the right coronary artery all patent)  . Cancer ([facility])   . Complication of anesthesia   . Compression fracture of L2 ([facility]) 03/12/2017  . Dementia ([place])   . Dysphagia 10/06/2012  . Dyspnea 02/03/2012  . GERD (gastroesophageal reflux disease)   . History of pulmonary embolus (PE) 03/19/2017  . HTN (hypertension)   . PMR (polymyalgia rheumatica) (HCC)   . PONV (postoperative nausea and vomiting)   . Restless leg syndrome   . 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 Started coumadin  . Weakness of both legs 06/09/2012   Past Surgical History:  Procedure Laterality Date  . BREAST LUMPECTOMY    . COLECTOMY    . ESOPHAGOGASTRODUODENOSCOPY (EGD) WITH ESOPHAGEAL DILATION  10/10/2012   Procedure: ESOPHAGOGASTRODUODENOSCOPY (EGD) WITH ESOPHAGEAL DILATION;  Surgeon: Inda Castle, MD;  Location: [facility];  Service: Endoscopy;  Laterality: N/A;  . FEMUR IM NAIL Left 12/13/2018  Procedure: INTRAMEDULLARY (IM) NAIL FEMORAL LEFT;  Surgeon: Paralee Cancel, MD;  [facility];  Service: Orthopedics;  Laterality: Left;  . heart stents  x 8  . INCISION AND DRAINAGE     bilateral axillary, non specific staff  . INCISION AND DRAINAGE ABSCESS  09/28/2012   Procedure: INCISION AND DRAINAGE ABSCESS;  Surgeon: Zenovia Jarred, MD;  Location: [facility];  Service: General;  Laterality: Bilateral;  .  stent     cardiac x 8 stents.    Allergies  Allergen Reactions  . Ambien [Zolpidem Tartrate] Nausea And Vomiting  . Codeine   . Codeine Phosphate Nausea And Vomiting  . Darvocet [Propoxyphene N-Acetaminophen] Nausea And Vomiting  . Fish Allergy   . Fish-Derived Products   . Hydrocodone   . Other     Chocolate Flavor.   . Promethazine   . Promethazine Hcl Nausea And Vomiting  . Sulfamethoxazole Other (See Comments)    Pt doesn't remember reaction  . Vicodin [Hydrocodone-Acetaminophen] Other (See Comments)    Unknown reaction  . Amoxicillin Nausea And Vomiting and Rash  . Penicillins Nausea And Vomiting and Rash    Outpatient Encounter Medications as of 11/19/2020  Medication Sig  . acetaminophen (TYLENOL) 325 MG tablet Take 650 mg by mouth every 6 (six) hours as needed for mild pain, moderate pain or fever.  Marland Kitchen albuterol (VENTOLIN HFA) 108 (90 Base) MCG/ACT inhaler Inhale 2 puffs into the lungs every 6 (six) hours as needed for wheezing or shortness of breath.  . ALPRAZolam (XANAX) 0.5 MG tablet Take 0.5 tablets (0.25 mg total) by mouth daily.  Marland Kitchen amLODipine (NORVASC) 10 MG tablet Take 10 mg by mouth every morning.   . calcium carbonate (TUMS - DOSED IN MG ELEMENTAL CALCIUM) 500 MG chewable tablet Chew 1 tablet by mouth 3 (three) times daily as needed for indigestion or heartburn.  . Cholecalciferol (VITAMIN D3) 1.25 MG (50000 UT) CAPS Take 1 capsule by mouth once a week.  . donepezil (ARICEPT) 10 MG tablet Take 10 mg by mouth at bedtime.   . Doxepin HCl 3 MG TABS Take 3 mg by mouth at bedtime. For Sleep  . losartan (COZAAR) 50 MG tablet Take 50 mg by mouth at bedtime.   . nitroGLYCERIN (NITROSTAT) 0.4 MG SL tablet Place 0.4 mg under the tongue every 5 (five) minutes as needed for chest pain.  . NON FORMULARY Diet Order: Mech Soft Liberalized Diet.  . Nutritional Supplements (NUTRITIONAL SUPPLEMENT PO) Take 1 Container by mouth 2 (two) times a day. Magic Cup - take with lunch and  dinner  . ondansetron (ZOFRAN) 4 MG tablet Take 4 mg by mouth every 4 (four) hours as needed for nausea.   . OXYGEN Inhale 2 L/min into the lungs as needed. Wean as able to keep SPO2 greater than 92%  . polyethylene glycol (MIRALAX / GLYCOLAX) packet Take 17 g by mouth daily.  . predniSONE (DELTASONE) 1 MG tablet Take 1 mg by mouth daily with breakfast.   . rOPINIRole (REQUIP) 1 MG tablet 1 mg. Take 1 tablet by mouth twice daily at lunch and at bedtime.  Marland Kitchen rOPINIRole (REQUIP) 2 MG tablet Take 2 mg by mouth. every evening .2 hours before bedtime  . senna-docusate (SENOKOT-S) 8.6-50 MG tablet Take 1 tablet by mouth 2 (two) times daily.  . traMADol (ULTRAM) 50 MG tablet Take 1 tablet (50 mg total) by mouth 2 (two) times daily.   No facility-administered encounter medications on file as of 11/19/2020.  Review of Systems  Unable to perform ROS: Dementia    Immunization History  Administered Date(s) Administered  . Influenza-Unspecified 08/01/2016, 08/11/2017, 07/02/2018, 08/02/2019, 08/19/2020  . Moderna Sars-Covid-2 Vaccination 11/01/2019, 11/29/2019, 09/18/2020  . PPD Test 03/18/2017  . Pneumococcal Conjugate-13 09/17/2017  . Pneumococcal Polysaccharide-23 04/17/2019   Pertinent  Health Maintenance Due  Topic Date Due  . INFLUENZA VACCINE  Completed  . DEXA SCAN  Completed  . PNA vac Low Risk Adult  Completed   Fall Risk  03/14/2018  Falls in the past year? No   Functional Status Survey:    There were no vitals filed for this visit. There is no height or weight on file to calculate BMI. Physical Exam Constitutional:      General: She is not in acute distress.    Comments: frail  HENT:     Head: Normocephalic.     Right Ear: There is no impacted cerumen.     Left Ear: There is no impacted cerumen.     Nose: Nose normal.     Mouth/Throat:     Mouth: Mucous membranes are moist.     Pharynx: No posterior oropharyngeal erythema.  Cardiovascular:     Rate and Rhythm: Normal  rate and regular rhythm.     Pulses: Normal pulses.     Heart sounds: Normal heart sounds. No murmur heard.   Pulmonary:     Effort: Pulmonary effort is normal. No respiratory distress.     Breath sounds: Normal breath sounds. No wheezing.  Abdominal:     General: Bowel sounds are normal. There is no distension.     Palpations: Abdomen is soft.     Tenderness: There is no abdominal tenderness.  Skin:    General: Skin is warm and dry.     Capillary Refill: Capillary refill takes less than 2 seconds.  Neurological:     Mental Status: She is alert. She is disoriented.     Motor: Weakness present.     Gait: Gait abnormal.  Psychiatric:        Mood and Affect: Mood normal.        Behavior: Behavior is hyperactive.        Cognition and Memory: Memory is impaired.     Labs reviewed: Recent Labs    05/06/20 0000 07/08/20 0000 07/16/20 0000  NA 137 133* 140  K 4.7 4.5 3.7  CL 104 98* 103  CO2 21 22 23*  BUN 27* 18 23*  CREATININE 0.9 0.9 1.0  CALCIUM 8.9 9.2 9.3   No results for input(s): AST, ALT, ALKPHOS, BILITOT, PROT, ALBUMIN in the last 8760 hours. Recent Labs    11/23/19 0000 11/26/19 0000 12/03/19 0000 04/10/20 0000 07/08/20 0000 07/16/20 0000 07/17/20 0000  WBC 18.2 14.7 8.9   < > 8.1 15.1 10.3  NEUTROABS 18 12 7   --   --   --   --   HGB 11.1* 9.4* 11.6*   < > 10.3* 9.6* 8.7*  HCT 35* 29* 36   < > 34* 32* 28*  PLT 312 224 461*   < > 351 460* 439*   < > = values in this interval not displayed.   Lab Results  Component Value Date   TSH 1.71 08/01/2018   Lab Results  Component Value Date   HGBA1C 5.7 08/01/2018   Lab Results  Component Value Date   CHOL 140 08/01/2018   HDL 46 08/01/2018   LDLCALC 80 08/01/2018   TRIG  82 08/01/2018   CHOLHDL 2.4 03/03/2012    Significant Diagnostic Results in last 30 days:  No results found.  Assessment/Plan - she appears more confused than normal, very talkative today - lung sounds clear, afebrile - suspect  possible UTI, she has a history of them - cbc/diff - bmp - UA and urine culture - encourage oral fluids QID   Family/ staff Communication: Plan discussed with patient and facility nurse  Labs/tests ordered:  Cbc/diff, bmp, UA, urine culture

## 2020-11-19 NOTE — Progress Notes (Signed)
Location:    [facility] Room Number: 204/D Place of Service:  SNF (31) Provider:Amy Fargo NP   [name], DO  Patient Care Team: [name], DO as PCP - General (Geriatric Medicine) Rehab, [place] ([place])  Extended Emergency Contact Information Primary Emergency Contact: Vonruden,[name] Address: [place], [place] [address] [name] of [place] Phone: [phone] Mobile Phone: [phone] Relation: Son Secondary Emergency Contact: [place] of [place] Phone: [phone] Relation: Daughter  Code Status: DNR  Goals of care: Advanced Directive information Advanced Directives 11/19/2020  Does Patient Have a Medical Advance Directive? Yes  Type of Paramedic of [place];Living will;Out of facility DNR (pink MOST or yellow form)  Does patient want to make changes to medical advance directive? No - Patient declined  Copy of [place] in Chart? Yes - validated most recent copy scanned in chart (See row information)  Pre-existing out of facility DNR order (yellow form or pink MOST form) Yellow form placed in chart (order not valid for inpatient use)     Chief Complaint  Patient presents with  . Acute Visit    Confusion   . Immunizations    T-Dap    HPI:  Pt is a 87 y.o. female seen today for an acute visit for    Past Medical History:  Diagnosis Date  . Anemia 03/02/2012  . Angina pectoris, unstable ([place]) 03/02/2012  . Anxiety   . Axillary abscess   . Bradycardia 06/13/2012  . CAD (coronary artery disease)    S/p PTCA / stenting (last cath 2004, multiple LAD stents, 2 stents in the right coronary artery all patent)  . Cancer ([place])   . Complication of anesthesia   . Compression fracture of L2 ([place]) 03/12/2017  . Dementia ([place])   . Dysphagia 10/06/2012  . Dyspnea 02/03/2012  . GERD (gastroesophageal  reflux disease)   . History of pulmonary embolus (PE) 03/19/2017  . HTN (hypertension)   . PMR (polymyalgia rheumatica) (HCC)   . PONV (postoperative nausea and vomiting)   . Restless leg syndrome   . 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 Started coumadin  . Weakness of both legs 06/09/2012   Past Surgical History:  Procedure Laterality Date  . BREAST LUMPECTOMY    . COLECTOMY    . ESOPHAGOGASTRODUODENOSCOPY (EGD) WITH ESOPHAGEAL DILATION  10/10/2012   Procedure: ESOPHAGOGASTRODUODENOSCOPY (EGD) WITH ESOPHAGEAL DILATION;  Surgeon: Inda Castle, MD;  Location: [facility];  Service: Endoscopy;  Laterality: N/A;  . FEMUR IM NAIL Left 12/13/2018   Procedure: INTRAMEDULLARY (IM) NAIL FEMORAL LEFT;  Surgeon: Paralee Cancel, MD;  [facility];  Service: Orthopedics;  Laterality: Left;  . heart stents  x 8  . INCISION AND DRAINAGE     bilateral axillary, non specific staff  . INCISION AND DRAINAGE ABSCESS  09/28/2012   Procedure: INCISION AND DRAINAGE ABSCESS;  Surgeon: Zenovia Jarred, MD;  Location: [facility];  Service: General;  Laterality: Bilateral;  . stent     cardiac x 8 stents.    Allergies  Allergen Reactions  . Ambien [Zolpidem Tartrate] Nausea And Vomiting  . Codeine   . Codeine Phosphate Nausea And Vomiting  . Darvocet [Propoxyphene N-Acetaminophen] Nausea And Vomiting  . Fish Allergy   . Fish-Derived Products   . Hydrocodone   .  Other     Chocolate Flavor.   . Promethazine   . Promethazine Hcl Nausea And Vomiting  . Sulfamethoxazole Other (See Comments)    Pt doesn't remember reaction  . Vicodin [Hydrocodone-Acetaminophen] Other (See Comments)    Unknown reaction  . Amoxicillin Nausea And Vomiting and Rash  . Penicillins Nausea And Vomiting and Rash    Allergies as of 11/19/2020      Reactions   Ambien [zolpidem Tartrate] Nausea And Vomiting   Codeine    Codeine Phosphate Nausea And Vomiting   Darvocet [propoxyphene N-acetaminophen] Nausea And  Vomiting   Fish Allergy    Fish-derived Products    Hydrocodone    Other    Chocolate Flavor.    Promethazine    Promethazine Hcl Nausea And Vomiting   Sulfamethoxazole Other (See Comments)   Pt doesn't remember reaction   Vicodin [hydrocodone-acetaminophen] Other (See Comments)   Unknown reaction   Amoxicillin Nausea And Vomiting, Rash   Penicillins Nausea And Vomiting, Rash      Medication List       Accurate as of November 19, 2020  3:55 PM. If you have any questions, ask your nurse or doctor.        acetaminophen 325 MG tablet Commonly known as: TYLENOL Take 650 mg by mouth every 6 (six) hours as needed for mild pain, moderate pain or fever.   albuterol 108 (90 Base) MCG/ACT inhaler Commonly known as: VENTOLIN HFA Inhale 2 puffs into the lungs every 6 (six) hours as needed for wheezing or shortness of breath.   ALPRAZolam 0.5 MG tablet Commonly known as: XANAX Take 0.5 tablets (0.25 mg total) by mouth daily.   amLODipine 10 MG tablet Commonly known as: NORVASC Take 10 mg by mouth every morning.   calcium carbonate 500 MG chewable tablet Commonly known as: TUMS - dosed in mg elemental calcium Chew 1 tablet by mouth 3 (three) times daily as needed for indigestion or heartburn.   donepezil 10 MG tablet Commonly known as: ARICEPT Take 10 mg by mouth at bedtime.   Doxepin HCl 3 MG Tabs Take 3 mg by mouth at bedtime. For Sleep   losartan 50 MG tablet Commonly known as: COZAAR Take 50 mg by mouth at bedtime.   nitroGLYCERIN 0.4 MG SL tablet Commonly known as: NITROSTAT Place 0.4 mg under the tongue every 5 (five) minutes as needed for chest pain.   NON FORMULARY Diet Order: Mech Soft Liberalized Diet with thin liquids no therapeutic restrictions.   NUTRITIONAL SUPPLEMENT PO Take 1 Container by mouth 2 (two) times a day. Magic Cup - take with lunch and dinner   ondansetron 4 MG tablet Commonly known as: ZOFRAN Take 4 mg by mouth every 4 (four) hours as  needed for nausea.   OXYGEN Inhale 2 L/min into the lungs as needed. Wean as able to keep SPO2 greater than 92%   polyethylene glycol 17 g packet Commonly known as: MIRALAX / GLYCOLAX Take 17 g by mouth daily.   predniSONE 1 MG tablet Commonly known as: DELTASONE Take 1 mg by mouth daily with breakfast.   rOPINIRole 2 MG tablet Commonly known as: REQUIP Take 2 mg by mouth. every evening .2 hours before bedtime   rOPINIRole 1 MG tablet Commonly known as: REQUIP 1 mg. Take 1 tablet by mouth twice daily at lunch and at bedtime.   senna-docusate 8.6-50 MG tablet Commonly known as: Senokot-S Take 1 tablet by mouth 2 (two) times daily.   traMADol  50 MG tablet Commonly known as: ULTRAM Take 1 tablet (50 mg total) by mouth 2 (two) times daily.   Vitamin D3 1.25 MG (50000 UT) Caps Take 1 capsule by mouth once a week.   warfarin 3 MG tablet Commonly known as: COUMADIN Take 3 mg by mouth daily.       Review of Systems  Immunization History  Administered Date(s) Administered  . Influenza-Unspecified 08/01/2016, 08/11/2017, 07/02/2018, 08/02/2019, 08/19/2020  . Moderna Sars-Covid-2 Vaccination 11/01/2019, 11/29/2019, 09/18/2020  . PPD Test 03/18/2017  . Pneumococcal Conjugate-13 09/17/2017  . Pneumococcal Polysaccharide-23 04/17/2019   Pertinent  Health Maintenance Due  Topic Date Due  . INFLUENZA VACCINE  Completed  . DEXA SCAN  Completed  . PNA vac Low Risk Adult  Completed   Fall Risk  03/14/2018  Falls in the past year? No   Functional Status Survey:    Vitals:   11/19/20 1554  BP: 122/69  Pulse: 76  Resp: 19  Temp: (!) 97.3 F (36.3 C)  SpO2: 98%  Weight: 136 lb 3.2 oz (61.8 kg)  Height: 5\' 1"  (1.549 m)   Body mass index is 25.73 kg/m. Physical Exam  Labs reviewed: Recent Labs    05/06/20 0000 07/08/20 0000 07/16/20 0000  NA 137 133* 140  K 4.7 4.5 3.7  CL 104 98* 103  CO2 21 22 23*  BUN 27* 18 23*  CREATININE 0.9 0.9 1.0  CALCIUM 8.9 9.2  9.3   No results for input(s): AST, ALT, ALKPHOS, BILITOT, PROT, ALBUMIN in the last 8760 hours. Recent Labs    11/23/19 0000 11/26/19 0000 12/03/19 0000 04/10/20 0000 07/08/20 0000 07/16/20 0000 07/17/20 0000  WBC 18.2 14.7 8.9   < > 8.1 15.1 10.3  NEUTROABS 18 12 7   --   --   --   --   HGB 11.1* 9.4* 11.6*   < > 10.3* 9.6* 8.7*  HCT 35* 29* 36   < > 34* 32* 28*  PLT 312 224 461*   < > 351 460* 439*   < > = values in this interval not displayed.   Lab Results  Component Value Date   TSH 1.71 08/01/2018   Lab Results  Component Value Date   HGBA1C 5.7 08/01/2018   Lab Results  Component Value Date   CHOL 140 08/01/2018   HDL 46 08/01/2018   LDLCALC 80 08/01/2018   TRIG 82 08/01/2018   CHOLHDL 2.4 03/03/2012    Significant Diagnostic Results in last 30 days:  No results found.  Assessment/Plan There are no diagnoses linked to this encounter.   Family/ staff Communication:   Labs/tests ordered:

## 2020-11-20 DIAGNOSIS — E785 Hyperlipidemia, unspecified: Secondary | ICD-10-CM | POA: Diagnosis not present

## 2020-11-20 DIAGNOSIS — M6281 Muscle weakness (generalized): Secondary | ICD-10-CM | POA: Diagnosis not present

## 2020-11-20 DIAGNOSIS — N390 Urinary tract infection, site not specified: Secondary | ICD-10-CM | POA: Diagnosis not present

## 2020-11-20 DIAGNOSIS — J9601 Acute respiratory failure with hypoxia: Secondary | ICD-10-CM | POA: Diagnosis not present

## 2020-11-20 DIAGNOSIS — I129 Hypertensive chronic kidney disease with stage 1 through stage 4 chronic kidney disease, or unspecified chronic kidney disease: Secondary | ICD-10-CM | POA: Diagnosis not present

## 2020-11-20 DIAGNOSIS — G301 Alzheimer's disease with late onset: Secondary | ICD-10-CM | POA: Diagnosis not present

## 2020-11-20 DIAGNOSIS — M353 Polymyalgia rheumatica: Secondary | ICD-10-CM | POA: Diagnosis not present

## 2020-11-20 DIAGNOSIS — R2681 Unsteadiness on feet: Secondary | ICD-10-CM | POA: Diagnosis not present

## 2020-11-21 DIAGNOSIS — G301 Alzheimer's disease with late onset: Secondary | ICD-10-CM | POA: Diagnosis not present

## 2020-11-21 DIAGNOSIS — I129 Hypertensive chronic kidney disease with stage 1 through stage 4 chronic kidney disease, or unspecified chronic kidney disease: Secondary | ICD-10-CM | POA: Diagnosis not present

## 2020-11-21 DIAGNOSIS — J9601 Acute respiratory failure with hypoxia: Secondary | ICD-10-CM | POA: Diagnosis not present

## 2020-11-21 DIAGNOSIS — R2681 Unsteadiness on feet: Secondary | ICD-10-CM | POA: Diagnosis not present

## 2020-11-21 DIAGNOSIS — M6281 Muscle weakness (generalized): Secondary | ICD-10-CM | POA: Diagnosis not present

## 2020-11-21 DIAGNOSIS — E785 Hyperlipidemia, unspecified: Secondary | ICD-10-CM | POA: Diagnosis not present

## 2020-11-21 DIAGNOSIS — M353 Polymyalgia rheumatica: Secondary | ICD-10-CM | POA: Diagnosis not present

## 2020-11-23 DIAGNOSIS — I129 Hypertensive chronic kidney disease with stage 1 through stage 4 chronic kidney disease, or unspecified chronic kidney disease: Secondary | ICD-10-CM | POA: Diagnosis not present

## 2020-11-23 DIAGNOSIS — R2681 Unsteadiness on feet: Secondary | ICD-10-CM | POA: Diagnosis not present

## 2020-11-23 DIAGNOSIS — J9601 Acute respiratory failure with hypoxia: Secondary | ICD-10-CM | POA: Diagnosis not present

## 2020-11-23 DIAGNOSIS — M353 Polymyalgia rheumatica: Secondary | ICD-10-CM | POA: Diagnosis not present

## 2020-11-23 DIAGNOSIS — E785 Hyperlipidemia, unspecified: Secondary | ICD-10-CM | POA: Diagnosis not present

## 2020-11-23 DIAGNOSIS — G301 Alzheimer's disease with late onset: Secondary | ICD-10-CM | POA: Diagnosis not present

## 2020-11-23 DIAGNOSIS — M6281 Muscle weakness (generalized): Secondary | ICD-10-CM | POA: Diagnosis not present

## 2020-11-24 ENCOUNTER — Non-Acute Institutional Stay (SKILLED_NURSING_FACILITY): Payer: Medicare Other | Admitting: Orthopedic Surgery

## 2020-11-24 ENCOUNTER — Encounter: Payer: Self-pay | Admitting: Orthopedic Surgery

## 2020-11-24 ENCOUNTER — Other Ambulatory Visit: Payer: Self-pay | Admitting: Orthopedic Surgery

## 2020-11-24 DIAGNOSIS — I129 Hypertensive chronic kidney disease with stage 1 through stage 4 chronic kidney disease, or unspecified chronic kidney disease: Secondary | ICD-10-CM | POA: Diagnosis not present

## 2020-11-24 DIAGNOSIS — G301 Alzheimer's disease with late onset: Secondary | ICD-10-CM | POA: Diagnosis not present

## 2020-11-24 DIAGNOSIS — E785 Hyperlipidemia, unspecified: Secondary | ICD-10-CM | POA: Diagnosis not present

## 2020-11-24 DIAGNOSIS — F411 Generalized anxiety disorder: Secondary | ICD-10-CM | POA: Diagnosis not present

## 2020-11-24 DIAGNOSIS — M353 Polymyalgia rheumatica: Secondary | ICD-10-CM

## 2020-11-24 DIAGNOSIS — J9601 Acute respiratory failure with hypoxia: Secondary | ICD-10-CM | POA: Diagnosis not present

## 2020-11-24 DIAGNOSIS — M6281 Muscle weakness (generalized): Secondary | ICD-10-CM | POA: Diagnosis not present

## 2020-11-24 DIAGNOSIS — F0280 Dementia in other diseases classified elsewhere without behavioral disturbance: Secondary | ICD-10-CM

## 2020-11-24 DIAGNOSIS — R2681 Unsteadiness on feet: Secondary | ICD-10-CM | POA: Diagnosis not present

## 2020-11-24 DIAGNOSIS — Z86711 Personal history of pulmonary embolism: Secondary | ICD-10-CM

## 2020-11-24 DIAGNOSIS — I10 Essential (primary) hypertension: Secondary | ICD-10-CM

## 2020-11-24 DIAGNOSIS — Z7901 Long term (current) use of anticoagulants: Secondary | ICD-10-CM

## 2020-11-24 MED ORDER — TRAMADOL HCL 50 MG PO TABS: 50.0000 mg | ORAL_TABLET | Freq: Two times a day (BID) | ORAL | 0 refills | Status: DC

## 2020-11-24 NOTE — Progress Notes (Signed)
Location:   [name], AGNP-C     [name], D.O., C.M.D.    Patient Care Team: [name], DO as PCP - General (Geriatric Medicine) Rehab, [facility] ([place])  Extended Emergency Contact Information Primary Emergency Contact: Smithhart,[name] Address: [address]          [place], [place] [address] [name] of [place] Phone: [phone] Mobile Phone: [phone] Relation: Son Secondary Emergency Contact: [place] of [employer] Phone: [phone] Relation: Daughter  Code Status:  DNR Goals of care: Advanced Directive information Advanced Directives 11/19/2020  Does Patient Have a Medical Advance Directive? Yes  Type of Paramedic of [place];Living will;Out of facility DNR (pink MOST or yellow form)  Does patient want to make changes to medical advance directive? No - Patient declined  Copy of [place] in Chart? Yes - validated most recent copy scanned in chart (See row information)  Pre-existing out of facility DNR order (yellow form or pink MOST form) Yellow form placed in chart (order not valid for inpatient use)     No chief complaint on file.   HPI:  Pt is a 87 y.o. female seen today for medical management of chronic diseases.    She is a resident of [facility], seen today at bedside. PMH includes: hypertension, CAD, history of pulmonary embolus, dysphagia, Alzheimers disease without behavioral disturbance, back pain, CKD stage III, depression, anxiety, failure to thrive.   Today she is sitting in her wheelchair eating lunch. She is alert to self, familiar faces. Disoriented to situation and time. Follows commands and can express need. Remains on 2L oxygen. 01/19 she was worked up for increased confusion. She was  given tylenol for her chronic back pain. No other reports of confusion reported,UA and culture negative. She continues to have regular checks for her INR. Son, [name] not interested in switching to [place] at this time. Continues to spend her free time coloring and doing puzzles. Word search puzzle half completed at bedside today. Overall, she is very pleasant to talk with and she was happy to have company today.   No recent falls, injuries or behavioral outbursts.   Recent blood pressures are as follows:  01/24- 140/64  01/23- 142/70  01/22- 140/80  Recent weight trends are as follows:  01/14- 136.2 lbs  12/15- 138.2 lbs  11/12- 140 lbs  Remains on mechanical soft diet with thin liquids. Given magic cups with meals. Eating about 50% of meals daily. Still able to feed self.   Facility nurse does not report any concerns, vitals stable.    Past Medical History:  Diagnosis Date  . Anemia 03/02/2012  . Angina pectoris, unstable ([place]) 03/02/2012  . Anxiety   . Axillary abscess   . Bradycardia 06/13/2012  . CAD (coronary artery disease)    S/p PTCA / stenting (last cath 2004, multiple LAD stents, 2 stents in the right coronary artery all patent)  . Cancer ([place])   . Complication of anesthesia   . Compression fracture of L2 ([place]) 03/12/2017  . Dementia ([place])   . Dysphagia 10/06/2012  . Dyspnea 02/03/2012  . GERD (gastroesophageal reflux disease)   . History of pulmonary embolus (PE) 03/19/2017  . HTN (hypertension)   . PMR (polymyalgia rheumatica) (HCC)   . PONV (postoperative nausea and  vomiting)   . Restless leg syndrome   . 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 Started coumadin  . Weakness of both legs 06/09/2012   Past Surgical History:  Procedure Laterality Date  . BREAST LUMPECTOMY    . COLECTOMY    . ESOPHAGOGASTRODUODENOSCOPY (EGD) WITH ESOPHAGEAL DILATION  10/10/2012   Procedure: ESOPHAGOGASTRODUODENOSCOPY (EGD) WITH ESOPHAGEAL DILATION;  Surgeon: Inda Castle, MD;   Location: [facility];  Service: Endoscopy;  Laterality: N/A;  . FEMUR IM NAIL Left 12/13/2018   Procedure: INTRAMEDULLARY (IM) NAIL FEMORAL LEFT;  Surgeon: Paralee Cancel, MD;  [facility];  Service: Orthopedics;  Laterality: Left;  . heart stents  x 8  . INCISION AND DRAINAGE     bilateral axillary, non specific staff  . INCISION AND DRAINAGE ABSCESS  09/28/2012   Procedure: INCISION AND DRAINAGE ABSCESS;  Surgeon: Zenovia Jarred, MD;  Location: [facility];  Service: General;  Laterality: Bilateral;  . stent     cardiac x 8 stents.    Allergies  Allergen Reactions  . Ambien [Zolpidem Tartrate] Nausea And Vomiting  . Codeine   . Codeine Phosphate Nausea And Vomiting  . Darvocet [Propoxyphene N-Acetaminophen] Nausea And Vomiting  . Fish Allergy   . Fish-Derived Products   . Hydrocodone   . Other     Chocolate Flavor.   . Promethazine   . Promethazine Hcl Nausea And Vomiting  . Sulfamethoxazole Other (See Comments)    Pt doesn't remember reaction  . Vicodin [Hydrocodone-Acetaminophen] Other (See Comments)    Unknown reaction  . Amoxicillin Nausea And Vomiting and Rash  . Penicillins Nausea And Vomiting and Rash    Outpatient Encounter Medications as of 11/24/2020  Medication Sig  . acetaminophen (TYLENOL) 325 MG tablet Take 650 mg by mouth every 6 (six) hours as needed for mild pain, moderate pain or fever.  Marland Kitchen albuterol (VENTOLIN HFA) 108 (90 Base) MCG/ACT inhaler Inhale 2 puffs into the lungs every 6 (six) hours as needed for wheezing or shortness of breath.  . ALPRAZolam (XANAX) 0.5 MG tablet Take 0.5 tablets (0.25 mg total) by mouth daily.  Marland Kitchen amLODipine (NORVASC) 10 MG tablet Take 10 mg by mouth every morning.   . calcium carbonate (TUMS - DOSED IN MG ELEMENTAL CALCIUM) 500 MG chewable tablet Chew 1 tablet by mouth 3 (three) times daily as needed for indigestion or heartburn.  . Cholecalciferol (VITAMIN D3) 1.25 MG (50000 UT) CAPS Take 1 capsule by mouth once a week.  .  donepezil (ARICEPT) 10 MG tablet Take 10 mg by mouth at bedtime.   . Doxepin HCl 3 MG TABS Take 3 mg by mouth at bedtime. For Sleep  . losartan (COZAAR) 50 MG tablet Take 50 mg by mouth at bedtime.   . nitroGLYCERIN (NITROSTAT) 0.4 MG SL tablet Place 0.4 mg under the tongue every 5 (five) minutes as needed for chest pain.  . NON FORMULARY Diet Order: Mech Soft Liberalized Diet with thin liquids no therapeutic restrictions.  . Nutritional Supplements (NUTRITIONAL SUPPLEMENT PO) Take 1 Container by mouth 2 (two) times a day. Magic Cup - take with lunch and dinner  . ondansetron (ZOFRAN) 4 MG tablet Take 4 mg by mouth every 4 (four) hours as needed for nausea.   . OXYGEN Inhale 2 L/min into the lungs as needed. Wean as able to keep SPO2 greater than 92%  . polyethylene glycol (MIRALAX / GLYCOLAX) packet Take 17 g by mouth daily.  . predniSONE (DELTASONE) 1  MG tablet Take 1 mg by mouth daily with breakfast.   . rOPINIRole (REQUIP) 1 MG tablet 1 mg. Take 1 tablet by mouth twice daily at lunch and at bedtime.  Marland Kitchen rOPINIRole (REQUIP) 2 MG tablet Take 2 mg by mouth. every evening .2 hours before bedtime  . senna-docusate (SENOKOT-S) 8.6-50 MG tablet Take 1 tablet by mouth 2 (two) times daily.  . traMADol (ULTRAM) 50 MG tablet Take 1 tablet (50 mg total) by mouth 2 (two) times daily.  Marland Kitchen warfarin (COUMADIN) 3 MG tablet Take 3 mg by mouth daily.   No facility-administered encounter medications on file as of 11/24/2020.    Review of Systems  Constitutional: Positive for weight loss. Negative for fever and malaise/fatigue.  HENT: Positive for hearing loss.        Trouble swallowing, missing teeth  Eyes: Negative for blurred vision.  Respiratory: Negative for cough, shortness of breath and wheezing.        Oxygen use  Cardiovascular: Negative for chest pain and leg swelling.  Gastrointestinal: Negative for abdominal pain, constipation, heartburn, nausea and vomiting.  Genitourinary:       Incontinence   Musculoskeletal: Positive for back pain and myalgias. Negative for falls.  Neurological: Positive for weakness. Negative for dizziness and headaches.  Psychiatric/Behavioral: Positive for depression and memory loss. The patient is nervous/anxious.     Immunization History  Administered Date(s) Administered  . Influenza-Unspecified 08/01/2016, 08/11/2017, 07/02/2018, 08/02/2019, 08/19/2020  . Moderna Sars-Covid-2 Vaccination 11/01/2019, 11/29/2019, 09/18/2020  . PPD Test 03/18/2017  . Pneumococcal Conjugate-13 09/17/2017  . Pneumococcal Polysaccharide-23 04/17/2019   Pertinent  Health Maintenance Due  Topic Date Due  . INFLUENZA VACCINE  Completed  . DEXA SCAN  Completed  . PNA vac Low Risk Adult  Completed   Fall Risk  03/14/2018  Falls in the past year? No   Functional Status Survey:    There were no vitals filed for this visit. There is no height or weight on file to calculate BMI. Physical Exam Vitals reviewed.  Constitutional:      General: She is not in acute distress. HENT:     Head: Normocephalic.     Right Ear: There is no impacted cerumen.     Left Ear: There is no impacted cerumen.     Nose: Nose normal.     Mouth/Throat:     Mouth: Mucous membranes are moist.  Eyes:     General:        Right eye: No discharge.        Left eye: No discharge.  Cardiovascular:     Rate and Rhythm: Normal rate and regular rhythm.     Pulses: Normal pulses.     Heart sounds: Murmur heard.    Pulmonary:     Effort: Pulmonary effort is normal. No respiratory distress.     Breath sounds: Normal breath sounds. No wheezing.  Abdominal:     General: Bowel sounds are normal. There is no distension.     Palpations: Abdomen is soft.     Tenderness: There is no abdominal tenderness.  Musculoskeletal:     Cervical back: Normal range of motion.     Right lower leg: No edema.     Left lower leg: No edema.  Lymphadenopathy:     Cervical: No cervical adenopathy.  Skin:     General: Skin is warm and dry.     Capillary Refill: Capillary refill takes less than 2 seconds.  Neurological:  General: No focal deficit present.     Mental Status: She is alert. Mental status is at baseline.     Motor: Weakness present.     Gait: Gait abnormal.  Psychiatric:        Mood and Affect: Mood normal.        Behavior: Behavior normal.        Cognition and Memory: Memory is impaired.     Labs reviewed: Recent Labs    05/06/20 0000 07/08/20 0000 07/16/20 0000  NA 137 133* 140  K 4.7 4.5 3.7  CL 104 98* 103  CO2 21 22 23*  BUN 27* 18 23*  CREATININE 0.9 0.9 1.0  CALCIUM 8.9 9.2 9.3   No results for input(s): AST, ALT, ALKPHOS, BILITOT, PROT, ALBUMIN in the last 8760 hours. Recent Labs    11/26/19 0000 12/03/19 0000 04/10/20 0000 07/08/20 0000 07/16/20 0000 07/17/20 0000  WBC 14.7 8.9   < > 8.1 15.1 10.3  NEUTROABS 12 7  --   --   --   --   HGB 9.4* 11.6*   < > 10.3* 9.6* 8.7*  HCT 29* 36   < > 34* 32* 28*  PLT 224 461*   < > 351 460* 439*   < > = values in this interval not displayed.   Lab Results  Component Value Date   TSH 1.71 08/01/2018   Lab Results  Component Value Date   HGBA1C 5.7 08/01/2018   Lab Results  Component Value Date   CHOL 140 08/01/2018   HDL 46 08/01/2018   LDLCALC 80 08/01/2018   TRIG 82 08/01/2018   CHOLHDL 2.4 03/03/2012    Significant Diagnostic Results in last 30 days:  No results found.  Assessment/Plan 1. Late onset Alzheimer's disease without behavioral disturbance ([place]) - stable no recent outbursts - continues to use wheelchair, color and feed self  2. Generalized anxiety disorder - no signs of increased anxiety - continue daily xanax regimen  3. PMR (polymyalgia rheumatica) (HCC) - no recent flares, cont prednisone  4. Essential hypertension - bp at goal < 150/90 - continue amlodipine and losartan - continue to limit sodium from diet  5. History of pulmonary embolus (PE) - stable with  coumadin, continue weekly checks - son not interested in switching to [place]  6. Long term current use of anticoagulant - goal INR 2-3 - recent INR 2.5 - continue 3 mg coumadin daily - continue INR checks    Family/ staff Communication: Plan discussed with patient and facility nurse  Labs/tests ordered:  None  [facility] [address], [place] [address] On Call:  [phone] & follow prompts after 5pm & weekends Office Phone:  [phone] Office Fax:  [phone]

## 2020-11-25 DIAGNOSIS — R2681 Unsteadiness on feet: Secondary | ICD-10-CM | POA: Diagnosis not present

## 2020-11-25 DIAGNOSIS — G301 Alzheimer's disease with late onset: Secondary | ICD-10-CM | POA: Diagnosis not present

## 2020-11-25 DIAGNOSIS — I129 Hypertensive chronic kidney disease with stage 1 through stage 4 chronic kidney disease, or unspecified chronic kidney disease: Secondary | ICD-10-CM | POA: Diagnosis not present

## 2020-11-25 DIAGNOSIS — J9601 Acute respiratory failure with hypoxia: Secondary | ICD-10-CM | POA: Diagnosis not present

## 2020-11-25 DIAGNOSIS — M6281 Muscle weakness (generalized): Secondary | ICD-10-CM | POA: Diagnosis not present

## 2020-11-25 DIAGNOSIS — E785 Hyperlipidemia, unspecified: Secondary | ICD-10-CM | POA: Diagnosis not present

## 2020-11-25 DIAGNOSIS — M353 Polymyalgia rheumatica: Secondary | ICD-10-CM | POA: Diagnosis not present

## 2020-11-26 DIAGNOSIS — E785 Hyperlipidemia, unspecified: Secondary | ICD-10-CM | POA: Diagnosis not present

## 2020-11-26 DIAGNOSIS — M6281 Muscle weakness (generalized): Secondary | ICD-10-CM | POA: Diagnosis not present

## 2020-11-26 DIAGNOSIS — G301 Alzheimer's disease with late onset: Secondary | ICD-10-CM | POA: Diagnosis not present

## 2020-11-26 DIAGNOSIS — J9601 Acute respiratory failure with hypoxia: Secondary | ICD-10-CM | POA: Diagnosis not present

## 2020-11-26 DIAGNOSIS — R2681 Unsteadiness on feet: Secondary | ICD-10-CM | POA: Diagnosis not present

## 2020-11-26 DIAGNOSIS — I129 Hypertensive chronic kidney disease with stage 1 through stage 4 chronic kidney disease, or unspecified chronic kidney disease: Secondary | ICD-10-CM | POA: Diagnosis not present

## 2020-11-26 DIAGNOSIS — M353 Polymyalgia rheumatica: Secondary | ICD-10-CM | POA: Diagnosis not present

## 2020-11-27 DIAGNOSIS — M6281 Muscle weakness (generalized): Secondary | ICD-10-CM | POA: Diagnosis not present

## 2020-11-27 DIAGNOSIS — M353 Polymyalgia rheumatica: Secondary | ICD-10-CM | POA: Diagnosis not present

## 2020-11-27 DIAGNOSIS — R2681 Unsteadiness on feet: Secondary | ICD-10-CM | POA: Diagnosis not present

## 2020-11-27 DIAGNOSIS — I129 Hypertensive chronic kidney disease with stage 1 through stage 4 chronic kidney disease, or unspecified chronic kidney disease: Secondary | ICD-10-CM | POA: Diagnosis not present

## 2020-11-27 DIAGNOSIS — G301 Alzheimer's disease with late onset: Secondary | ICD-10-CM | POA: Diagnosis not present

## 2020-11-27 DIAGNOSIS — J9601 Acute respiratory failure with hypoxia: Secondary | ICD-10-CM | POA: Diagnosis not present

## 2020-11-27 DIAGNOSIS — E785 Hyperlipidemia, unspecified: Secondary | ICD-10-CM | POA: Diagnosis not present

## 2020-11-28 DIAGNOSIS — R2681 Unsteadiness on feet: Secondary | ICD-10-CM | POA: Diagnosis not present

## 2020-11-28 DIAGNOSIS — I129 Hypertensive chronic kidney disease with stage 1 through stage 4 chronic kidney disease, or unspecified chronic kidney disease: Secondary | ICD-10-CM | POA: Diagnosis not present

## 2020-11-28 DIAGNOSIS — M6281 Muscle weakness (generalized): Secondary | ICD-10-CM | POA: Diagnosis not present

## 2020-11-28 DIAGNOSIS — E785 Hyperlipidemia, unspecified: Secondary | ICD-10-CM | POA: Diagnosis not present

## 2020-11-28 DIAGNOSIS — J9601 Acute respiratory failure with hypoxia: Secondary | ICD-10-CM | POA: Diagnosis not present

## 2020-11-28 DIAGNOSIS — M353 Polymyalgia rheumatica: Secondary | ICD-10-CM | POA: Diagnosis not present

## 2020-11-28 DIAGNOSIS — G301 Alzheimer's disease with late onset: Secondary | ICD-10-CM | POA: Diagnosis not present

## 2020-12-11 ENCOUNTER — Other Ambulatory Visit: Payer: Self-pay | Admitting: Orthopedic Surgery

## 2020-12-11 DIAGNOSIS — M353 Polymyalgia rheumatica: Secondary | ICD-10-CM

## 2020-12-11 MED ORDER — TRAMADOL HCL 50 MG PO TABS: 50.0000 mg | ORAL_TABLET | Freq: Two times a day (BID) | ORAL | 0 refills | Status: DC

## 2020-12-22 ENCOUNTER — Encounter: Payer: Self-pay | Admitting: Internal Medicine

## 2020-12-23 ENCOUNTER — Other Ambulatory Visit: Payer: Self-pay | Admitting: Internal Medicine

## 2020-12-23 DIAGNOSIS — I739 Peripheral vascular disease, unspecified: Secondary | ICD-10-CM | POA: Diagnosis not present

## 2020-12-23 DIAGNOSIS — L602 Onychogryphosis: Secondary | ICD-10-CM | POA: Diagnosis not present

## 2020-12-23 DIAGNOSIS — M353 Polymyalgia rheumatica: Secondary | ICD-10-CM

## 2020-12-23 MED ORDER — TRAMADOL HCL 50 MG PO TABS: 50.0000 mg | ORAL_TABLET | Freq: Two times a day (BID) | ORAL | 0 refills | Status: DC

## 2020-12-24 ENCOUNTER — Encounter: Payer: Self-pay | Admitting: Orthopedic Surgery

## 2020-12-24 NOTE — Progress Notes (Signed)
Location:    [facility] Room Number: 204/D Place of Service:  SNF (31) Provider: Windell Moulding NP   [name], DO  Patient Care Team: [name], DO as PCP - General (Geriatric Medicine) Rehab, [place] ([place])  Extended Emergency Contact Information Primary Emergency Contact: Leppla,[name] Address: [place], [place] [address] [name] of [place] Phone: [phone] Mobile Phone: [phone] Relation: Son Secondary Emergency Contact: [place] of [place] Phone: [phone] Relation: Daughter  Code Status: DNR  Goals of care: Advanced Directive information Advanced Directives 12/24/2020  Does Patient Have a Medical Advance Directive? Yes  Type of Paramedic of [place];Living will;Out of facility DNR (pink MOST or yellow form)  Does patient want to make changes to medical advance directive? No - Patient declined  Copy of [place] in Chart? Yes - validated most recent copy scanned in chart (See row information)  Pre-existing out of facility DNR order (yellow form or pink MOST form) Yellow form placed in chart (order not valid for inpatient use)     Chief Complaint  Patient presents with  . Medical Management of Chronic Issues    Routine Visit of Medical Management   . Immunizations    Discuss need for T-Dap    HPI:  Pt is a 87 y.o. female seen today for medical management of chronic diseases.     Past Medical History:  Diagnosis Date  . Anemia 03/02/2012  . Angina pectoris, unstable ([place]) 03/02/2012  . Anxiety   . Axillary abscess   . Bradycardia 06/13/2012  . CAD (coronary artery disease)    S/p PTCA / stenting (last cath 2004, multiple LAD stents, 2 stents in the right coronary artery all patent)  . Cancer ([place])   . Complication of anesthesia   . Compression fracture of L2 ([place]) 03/12/2017  .  Dementia ([place])   . Dysphagia 10/06/2012  . Dyspnea 02/03/2012  . GERD (gastroesophageal reflux disease)   . History of pulmonary embolus (PE) 03/19/2017  . HTN (hypertension)   . PMR (polymyalgia rheumatica) (HCC)   . PONV (postoperative nausea and vomiting)   . Restless leg syndrome   . 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 Started coumadin  . Weakness of both legs 06/09/2012   Past Surgical History:  Procedure Laterality Date  . BREAST LUMPECTOMY    . COLECTOMY    . ESOPHAGOGASTRODUODENOSCOPY (EGD) WITH ESOPHAGEAL DILATION  10/10/2012   Procedure: ESOPHAGOGASTRODUODENOSCOPY (EGD) WITH ESOPHAGEAL DILATION;  Surgeon: Inda Castle, MD;  Location: [facility];  Service: Endoscopy;  Laterality: N/A;  . FEMUR IM NAIL Left 12/13/2018   Procedure: INTRAMEDULLARY (IM) NAIL FEMORAL LEFT;  Surgeon: Paralee Cancel, MD;  Location: [facility];  Service: Orthopedics;  Laterality: Left;  . heart stents  x 8  . INCISION AND DRAINAGE     bilateral axillary, non specific staff  . INCISION AND DRAINAGE ABSCESS  09/28/2012   Procedure: INCISION AND DRAINAGE ABSCESS;  Surgeon: Zenovia Jarred, MD;  Location: [facility];  Service: General;  Laterality: Bilateral;  . stent     cardiac x 8 stents.    Allergies  Allergen Reactions  . Ambien [Zolpidem Tartrate] Nausea And Vomiting  . Codeine   . Codeine Phosphate Nausea And Vomiting  . Darvocet [Propoxyphene N-Acetaminophen] Nausea And Vomiting  . Fish  Allergy   . Fish-Derived Products   . Hydrocodone   . Other     Chocolate Flavor.   . Promethazine   . Promethazine Hcl Nausea And Vomiting  . Sulfamethoxazole Other (See Comments)    Pt doesn't remember reaction  . Vicodin [Hydrocodone-Acetaminophen] Other (See Comments)    Unknown reaction  . Amoxicillin Nausea And Vomiting and Rash  . Penicillins Nausea And Vomiting and Rash    Allergies as of 12/24/2020      Reactions   Ambien [zolpidem Tartrate] Nausea And Vomiting   Codeine     Codeine Phosphate Nausea And Vomiting   Darvocet [propoxyphene N-acetaminophen] Nausea And Vomiting   Fish Allergy    Fish-derived Products    Hydrocodone    Other    Chocolate Flavor.    Promethazine    Promethazine Hcl Nausea And Vomiting   Sulfamethoxazole Other (See Comments)   Pt doesn't remember reaction   Vicodin [hydrocodone-acetaminophen] Other (See Comments)   Unknown reaction   Amoxicillin Nausea And Vomiting, Rash   Penicillins Nausea And Vomiting, Rash      Medication List       Accurate as of December 24, 2020  8:51 AM. If you have any questions, ask your nurse or doctor.        acetaminophen 325 MG tablet Commonly known as: TYLENOL Take 650 mg by mouth every 6 (six) hours as needed for mild pain, moderate pain or fever.   albuterol 108 (90 Base) MCG/ACT inhaler Commonly known as: VENTOLIN HFA Inhale 2 puffs into the lungs every 6 (six) hours as needed for wheezing or shortness of breath.   ALPRAZolam 0.25 MG tablet Commonly known as: XANAX Take 0.25 mg by mouth daily in the afternoon. What changed: Another medication with the same name was removed. Continue taking this medication, and follow the directions you see here. Changed by: Yvonna Alanis, NP   amLODipine 10 MG tablet Commonly known as: NORVASC Take 10 mg by mouth every morning.   calcium carbonate 500 MG chewable tablet Commonly known as: TUMS - dosed in mg elemental calcium Chew 1 tablet by mouth 3 (three) times daily as needed for indigestion or heartburn.   donepezil 10 MG tablet Commonly known as: ARICEPT Take 10 mg by mouth at bedtime.   Doxepin HCl 3 MG Tabs Take 3 mg by mouth at bedtime. For Sleep   losartan 50 MG tablet Commonly known as: COZAAR Take 50 mg by mouth at bedtime.   nitroGLYCERIN 0.4 MG SL tablet Commonly known as: NITROSTAT Place 0.4 mg under the tongue every 5 (five) minutes as needed for chest pain.   NON FORMULARY Diet Order: Mech Soft Liberalized Diet with  thin liquids no therapeutic restrictions.   NUTRITIONAL SUPPLEMENT PO Take 1 Container by mouth 2 (two) times a day. Magic Cup - take with lunch and dinner   ondansetron 4 MG tablet Commonly known as: ZOFRAN Take 4 mg by mouth every 4 (four) hours as needed for nausea.   OXYGEN Inhale 2 L/min into the lungs as needed. Wean as able to keep SPO2 greater than 92%   polyethylene glycol 17 g packet Commonly known as: MIRALAX / GLYCOLAX Take 17 g by mouth daily.   predniSONE 1 MG tablet Commonly known as: DELTASONE Take 1 mg by mouth daily with breakfast.   rOPINIRole 2 MG tablet Commonly known as: REQUIP Take 2 mg by mouth. every evening .2 hours before bedtime   rOPINIRole 1 MG tablet  Commonly known as: REQUIP 1 mg. Take 1 tablet by mouth twice daily at lunch and at bedtime.   senna-docusate 8.6-50 MG tablet Commonly known as: Senokot-S Take 1 tablet by mouth 2 (two) times daily.   traMADol 50 MG tablet Commonly known as: ULTRAM Take 1 tablet (50 mg total) by mouth 2 (two) times daily.   Vitamin D3 1.25 MG (50000 UT) Caps Take 1 capsule by mouth once a week.   warfarin 3 MG tablet Commonly known as: COUMADIN Take 3 mg by mouth daily. On Tues, Thurs, Sat   warfarin 2.5 MG tablet Commonly known as: COUMADIN Take 2.5 mg by mouth daily. On Mon,Wed,Fri,Sun       Review of Systems  Immunization History  Administered Date(s) Administered  . Influenza-Unspecified 08/01/2016, 08/11/2017, 07/02/2018, 08/02/2019, 08/19/2020  . Moderna Sars-Covid-2 Vaccination 11/01/2019, 11/29/2019, 09/18/2020  . PPD Test 03/18/2017  . Pneumococcal Conjugate-13 09/17/2017  . Pneumococcal Polysaccharide-23 04/17/2019   Pertinent  Health Maintenance Due  Topic Date Due  . INFLUENZA VACCINE  Completed  . DEXA SCAN  Completed  . PNA vac Low Risk Adult  Completed   Fall Risk  03/14/2018  Falls in the past year? No   Functional Status Survey:    Vitals:   12/24/20 0850  BP: 132/76   Pulse: 62  Resp: 18  Temp: (!) 97.4 F (36.3 C)  SpO2: 98%  Weight: 134 lb 3.2 oz (60.9 kg)  Height: 5\' 1"  (1.549 m)   Body mass index is 25.36 kg/m. Physical Exam  Labs reviewed: Recent Labs    05/06/20 0000 07/08/20 0000 07/16/20 0000  NA 137 133* 140  K 4.7 4.5 3.7  CL 104 98* 103  CO2 21 22 23*  BUN 27* 18 23*  CREATININE 0.9 0.9 1.0  CALCIUM 8.9 9.2 9.3   No results for input(s): AST, ALT, ALKPHOS, BILITOT, PROT, ALBUMIN in the last 8760 hours. Recent Labs    07/08/20 0000 07/16/20 0000 07/17/20 0000  WBC 8.1 15.1 10.3  HGB 10.3* 9.6* 8.7*  HCT 34* 32* 28*  PLT 351 460* 439*   Lab Results  Component Value Date   TSH 1.71 08/01/2018   Lab Results  Component Value Date   HGBA1C 5.7 08/01/2018   Lab Results  Component Value Date   CHOL 140 08/01/2018   HDL 46 08/01/2018   LDLCALC 80 08/01/2018   TRIG 82 08/01/2018   CHOLHDL 2.4 03/03/2012    Significant Diagnostic Results in last 30 days:  No results found.  Assessment/Plan There are no diagnoses linked to this encounter.   Family/ staff Communication:   Labs/tests ordered:      This encounter was created in error - please disregard.

## 2020-12-25 ENCOUNTER — Non-Acute Institutional Stay (SKILLED_NURSING_FACILITY): Payer: Medicare Other | Admitting: Orthopedic Surgery

## 2020-12-25 ENCOUNTER — Encounter: Payer: Self-pay | Admitting: Orthopedic Surgery

## 2020-12-25 DIAGNOSIS — F5101 Primary insomnia: Secondary | ICD-10-CM | POA: Diagnosis not present

## 2020-12-25 DIAGNOSIS — I10 Essential (primary) hypertension: Secondary | ICD-10-CM

## 2020-12-25 DIAGNOSIS — Z86711 Personal history of pulmonary embolism: Secondary | ICD-10-CM | POA: Diagnosis not present

## 2020-12-25 DIAGNOSIS — F411 Generalized anxiety disorder: Secondary | ICD-10-CM

## 2020-12-25 DIAGNOSIS — G301 Alzheimer's disease with late onset: Secondary | ICD-10-CM | POA: Diagnosis not present

## 2020-12-25 DIAGNOSIS — F0280 Dementia in other diseases classified elsewhere without behavioral disturbance: Secondary | ICD-10-CM

## 2020-12-25 DIAGNOSIS — Z7901 Long term (current) use of anticoagulants: Secondary | ICD-10-CM

## 2020-12-25 DIAGNOSIS — I25118 Atherosclerotic heart disease of native coronary artery with other forms of angina pectoris: Secondary | ICD-10-CM

## 2020-12-25 DIAGNOSIS — F32A Depression, unspecified: Secondary | ICD-10-CM | POA: Diagnosis not present

## 2020-12-25 DIAGNOSIS — S81811A Laceration without foreign body, right lower leg, initial encounter: Secondary | ICD-10-CM

## 2020-12-25 DIAGNOSIS — F0281 Dementia in other diseases classified elsewhere with behavioral disturbance: Secondary | ICD-10-CM | POA: Diagnosis not present

## 2020-12-25 DIAGNOSIS — R634 Abnormal weight loss: Secondary | ICD-10-CM

## 2020-12-25 NOTE — Progress Notes (Signed)
Location:    [facility] Room Number: 204/D Place of Service:  SNF (31) Provider: Windell Moulding NP   [name], DO  Patient Care Team: [name], DO as PCP - General (Geriatric Medicine) Rehab, [facility] ([place])  Extended Emergency Contact Information Primary Emergency Contact: Raker,[name] Address: [place], [place] [address] [name] of [place] Phone: [phone] Mobile Phone: [phone] Relation: Son Secondary Emergency Contact: [place] of [place] Phone: [phone] Relation: Daughter  Code Status:  DNR Goals of care: Advanced Directive information Advanced Directives 12/25/2020  Does Patient Have a Medical Advance Directive? Yes  Type of Paramedic of [place];Living will;Out of facility DNR (pink MOST or yellow form)  Does patient want to make changes to medical advance directive? No - Patient declined  Copy of [place] in Chart? Yes - validated most recent copy scanned in chart (See row information)  Pre-existing out of facility DNR order (yellow form or pink MOST form) Yellow form placed in chart (order not valid for inpatient use)     Chief Complaint  Patient presents with  . Medical Management of Chronic Issues    Routine Visit of Medical Management   . Immunizations    Discuss need for T-Dap    HPI:  Pt is a 87 y.o. female seen today for medical management of chronic diseases.    She is a resident of [facility], seen today at bedside. PMH includes: hypertension, CAD, history of pulmonary embolus, dysphagia, Alzheimers disease without behavioral disturbance, back pain, CKD stage III, depression, anxiety, failure to thrive.   Today, she is laying in bed coloring. She is alert to self and familiar faces. Remains disoriented to time and situation. Follows  commands and can express needs. Continues to use 2 liters oxygen. Reports her daughter recently visited and painted her nails. She was happy to show me her painted nails. Facility wound nurse has been following right shin skin tear. Unknown how she got it. Amira denies pain. Continues to require assistance with ADLs and transfers to wheelchair. She denies chest pain or shortness of breath.   No recent falls or behavioral outbursts.   Recent blood pressures are as follows:  02/24- 120/64  02/23- 128/72  02/22- 132/76  Recent weights are as follows:  02/07- 134.2 lbs  01/14- 136.2 lbs  12/15- 138.2 lbs  Facility nurse does not report any concerns, vitals stable.      Past Medical History:  Diagnosis Date  . Anemia 03/02/2012  . Angina pectoris, unstable ([place]) 03/02/2012  . Anxiety   . Axillary abscess   . Bradycardia 06/13/2012  . CAD (coronary artery disease)    S/p PTCA / stenting (last cath 2004, multiple LAD stents, 2 stents in the right coronary artery all patent)  . Cancer ([place])   . Complication of anesthesia   . Compression fracture of L2 ([place]) 03/12/2017  . Dementia ([place])   . Dysphagia 10/06/2012  . Dyspnea 02/03/2012  . GERD (gastroesophageal reflux disease)   . History of pulmonary embolus (PE) 03/19/2017  . HTN (hypertension)   . PMR (polymyalgia rheumatica) (HCC)   . PONV (postoperative nausea and vomiting)   . Restless leg syndrome   . 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 Started coumadin  . Weakness of both legs  06/09/2012   Past Surgical History:  Procedure Laterality Date  . BREAST LUMPECTOMY    . COLECTOMY    . ESOPHAGOGASTRODUODENOSCOPY (EGD) WITH ESOPHAGEAL DILATION  10/10/2012   Procedure: ESOPHAGOGASTRODUODENOSCOPY (EGD) WITH ESOPHAGEAL DILATION;  Surgeon: Inda Castle, MD;  Location: [facility];  Service: Endoscopy;  Laterality: N/A;  . FEMUR IM NAIL Left 12/13/2018   Procedure: INTRAMEDULLARY (IM) NAIL FEMORAL LEFT;  Surgeon: Paralee Cancel, MD;  [facility];  Service: Orthopedics;  Laterality: Left;  . heart stents  x 8  . INCISION AND DRAINAGE     bilateral axillary, non specific staff  . INCISION AND DRAINAGE ABSCESS  09/28/2012   Procedure: INCISION AND DRAINAGE ABSCESS;  Surgeon: Zenovia Jarred, MD;  Location: [facility];  Service: General;  Laterality: Bilateral;  . stent     cardiac x 8 stents.    Allergies  Allergen Reactions  . Ambien [Zolpidem Tartrate] Nausea And Vomiting  . Codeine   . Codeine Phosphate Nausea And Vomiting  . Darvocet [Propoxyphene N-Acetaminophen] Nausea And Vomiting  . Fish Allergy   . Fish-Derived Products   . Hydrocodone   . Other     Chocolate Flavor.   . Promethazine   . Promethazine Hcl Nausea And Vomiting  . Sulfamethoxazole Other (See Comments)    Pt doesn't remember reaction  . Vicodin [Hydrocodone-Acetaminophen] Other (See Comments)    Unknown reaction  . Amoxicillin Nausea And Vomiting and Rash  . Penicillins Nausea And Vomiting and Rash    Allergies as of 12/25/2020      Reactions   Ambien [zolpidem Tartrate] Nausea And Vomiting   Codeine    Codeine Phosphate Nausea And Vomiting   Darvocet [propoxyphene N-acetaminophen] Nausea And Vomiting   Fish Allergy    Fish-derived Products    Hydrocodone    Other    Chocolate Flavor.    Promethazine    Promethazine Hcl Nausea And Vomiting   Sulfamethoxazole Other (See Comments)   Pt doesn't remember reaction   Vicodin [hydrocodone-acetaminophen] Other (See Comments)   Unknown reaction   Amoxicillin Nausea And Vomiting, Rash   Penicillins Nausea And Vomiting, Rash      Medication List       Accurate as of December 25, 2020  9:43 AM. If you have any questions, ask your nurse or doctor.        acetaminophen 325 MG tablet Commonly known as: TYLENOL Take 650 mg by mouth every 6 (six) hours as needed for mild pain, moderate pain or fever.   albuterol 108 (90 Base) MCG/ACT inhaler Commonly known as:  VENTOLIN HFA Inhale 2 puffs into the lungs every 6 (six) hours as needed for wheezing or shortness of breath.   ALPRAZolam 0.25 MG tablet Commonly known as: XANAX Take 0.25 mg by mouth daily in the afternoon.   amLODipine 10 MG tablet Commonly known as: NORVASC Take 10 mg by mouth every morning.   calcium carbonate 500 MG chewable tablet Commonly known as: TUMS - dosed in mg elemental calcium Chew 1 tablet by mouth 3 (three) times daily as needed for indigestion or heartburn.   donepezil 10 MG tablet Commonly known as: ARICEPT Take 10 mg by mouth at bedtime.   Doxepin HCl 3 MG Tabs Take 3 mg by mouth at bedtime. For Sleep   losartan 50 MG tablet Commonly known as: COZAAR Take 50 mg by mouth at bedtime.   nitroGLYCERIN 0.4 MG SL tablet Commonly known as: NITROSTAT Place 0.4 mg under  the tongue every 5 (five) minutes as needed for chest pain.   NON FORMULARY Diet Order: Mech Soft Liberalized Diet with thin liquids no therapeutic restrictions.   NUTRITIONAL SUPPLEMENT PO Take 1 Container by mouth 2 (two) times a day. Magic Cup - take with lunch and dinner   ondansetron 4 MG tablet Commonly known as: ZOFRAN Take 4 mg by mouth every 4 (four) hours as needed for nausea.   OXYGEN Inhale 2 L/min into the lungs as needed. Wean as able to keep SPO2 greater than 92%   polyethylene glycol 17 g packet Commonly known as: MIRALAX / GLYCOLAX Take 17 g by mouth daily.   predniSONE 1 MG tablet Commonly known as: DELTASONE Take 1 mg by mouth daily with breakfast.   rOPINIRole 2 MG tablet Commonly known as: REQUIP Take 2 mg by mouth. every evening .2 hours before bedtime   rOPINIRole 1 MG tablet Commonly known as: REQUIP 1 mg. Take 1 tablet by mouth twice daily at lunch and at bedtime.   senna-docusate 8.6-50 MG tablet Commonly known as: Senokot-S Take 1 tablet by mouth 2 (two) times daily.   traMADol 50 MG tablet Commonly known as: ULTRAM Take 1 tablet (50 mg total) by  mouth 2 (two) times daily.   Vitamin D3 1.25 MG (50000 UT) Caps Take 1 capsule by mouth once a week.   warfarin 3 MG tablet Commonly known as: COUMADIN Take 3 mg by mouth daily. On Tues, Thurs, Sat   warfarin 2.5 MG tablet Commonly known as: COUMADIN Take 2.5 mg by mouth daily. On Mon,Wed,Fri,Sun       Review of Systems  Constitutional: Negative for activity change, appetite change and fever.  HENT: Positive for hearing loss and trouble swallowing. Negative for dental problem.        Missing teeth  Eyes: Negative for visual disturbance.  Respiratory: Negative for cough, shortness of breath and wheezing.        Oxygen use  Cardiovascular: Negative for chest pain and leg swelling.  Gastrointestinal: Negative for abdominal distention, abdominal pain, constipation, diarrhea and nausea.  Genitourinary: Negative for dysuria, frequency and hematuria.  Musculoskeletal: Positive for arthralgias and myalgias.  Skin:       Skin tear  Neurological: Positive for weakness. Negative for dizziness and headaches.  Hematological: Bruises/bleeds easily.  Psychiatric/Behavioral: Positive for confusion and dysphoric mood. The patient is not nervous/anxious.     Immunization History  Administered Date(s) Administered  . Influenza-Unspecified 08/01/2016, 08/11/2017, 07/02/2018, 08/02/2019, 08/19/2020  . Moderna Sars-Covid-2 Vaccination 11/01/2019, 11/29/2019, 09/18/2020  . PPD Test 03/18/2017  . Pneumococcal Conjugate-13 09/17/2017  . Pneumococcal Polysaccharide-23 04/17/2019   Pertinent  Health Maintenance Due  Topic Date Due  . INFLUENZA VACCINE  Completed  . DEXA SCAN  Completed  . PNA vac Low Risk Adult  Completed   Fall Risk  03/14/2018  Falls in the past year? No   Functional Status Survey:    Vitals:   12/25/20 0940  BP: 120/64  Pulse: 77  Resp: 16  Temp: 98.2 F (36.8 C)  SpO2: 97%  Weight: 134 lb 3.2 oz (60.9 kg)  Height: 5\' 1"  (1.549 m)   Body mass index is 25.36  kg/m. Physical Exam Vitals reviewed.  Constitutional:      General: She is not in acute distress. HENT:     Head: Normocephalic.     Right Ear: There is no impacted cerumen.     Left Ear: There is no impacted cerumen.  Nose: Nose normal.     Mouth/Throat:     Mouth: Mucous membranes are moist.  Eyes:     General:        Right eye: No discharge.        Left eye: No discharge.  Cardiovascular:     Rate and Rhythm: Normal rate. Rhythm irregular.     Pulses: Normal pulses.     Heart sounds: Normal heart sounds. No murmur heard.   Pulmonary:     Effort: Pulmonary effort is normal. No respiratory distress.     Breath sounds: Normal breath sounds.  Abdominal:     General: Abdomen is flat. Bowel sounds are normal. There is no distension.     Palpations: Abdomen is soft.     Tenderness: There is no abdominal tenderness.  Musculoskeletal:     Cervical back: Normal range of motion.     Right lower leg: No edema.     Left lower leg: No edema.  Lymphadenopathy:     Cervical: No cervical adenopathy.  Skin:    General: Skin is warm and dry.     Capillary Refill: Capillary refill takes less than 2 seconds.     Comments: Skin tear to right lower shin. No sign of infection. Covered with foam dressing.   Neurological:     General: No focal deficit present.     Mental Status: She is alert. Mental status is at baseline.     Motor: Weakness present.     Gait: Gait abnormal.     Comments: Wheelchair/lift  Psychiatric:        Mood and Affect: Mood normal.        Behavior: Behavior normal.        Cognition and Memory: Memory is impaired.     Labs reviewed: Recent Labs    05/06/20 0000 07/08/20 0000 07/16/20 0000  NA 137 133* 140  K 4.7 4.5 3.7  CL 104 98* 103  CO2 21 22 23*  BUN 27* 18 23*  CREATININE 0.9 0.9 1.0  CALCIUM 8.9 9.2 9.3   No results for input(s): AST, ALT, ALKPHOS, BILITOT, PROT, ALBUMIN in the last 8760 hours. Recent Labs    07/08/20 0000 07/16/20 0000  07/17/20 0000  WBC 8.1 15.1 10.3  HGB 10.3* 9.6* 8.7*  HCT 34* 32* 28*  PLT 351 460* 439*   Lab Results  Component Value Date   TSH 1.71 08/01/2018   Lab Results  Component Value Date   HGBA1C 5.7 08/01/2018   Lab Results  Component Value Date   CHOL 140 08/01/2018   HDL 46 08/01/2018   LDLCALC 80 08/01/2018   TRIG 82 08/01/2018   CHOLHDL 2.4 03/03/2012    Significant Diagnostic Results in last 30 days:  No results found.  Assessment/Plan 1. Late onset Alzheimer's disease without behavioral disturbance ([place]) - no recent outbursts, continues to feed self  2. Generalized anxiety disorder - stable with daily xanax  3. Essential hypertension - at goal, cont amlodipine and losartan - cont to limit sodium in diet - cbc/diff- next month - bmp- next month  4. History of pulmonary embolus (PE) - stable with coumadin - cont weekly INR checks  5. Long term current use of anticoagulant - goal INR 2-3 - cont coumadin regimen  6. Coronary artery disease of native artery of native heart with stable angina pectoris ([place]) - no recent episodes of chest pain - cont heart healthy diet - cont prn nitroglycerin for chest pain  7. Skin tear of lower leg without complication, right, initial encounter - followed by facility wound nurse - cont daily cleanse with NS, apply xeroform and cover with foam dressing  8. Weight loss - lost about 4 lbs in 2 months - cont mechanical soft diet and magic cups - albumin- next month   Family/ staff Communication: Plan discussed with patient and facility nurse  Labs/tests ordered:  Labs to be done next month

## 2020-12-30 DIAGNOSIS — I2510 Atherosclerotic heart disease of native coronary artery without angina pectoris: Secondary | ICD-10-CM | POA: Diagnosis not present

## 2020-12-30 DIAGNOSIS — J9601 Acute respiratory failure with hypoxia: Secondary | ICD-10-CM | POA: Diagnosis not present

## 2021-01-15 ENCOUNTER — Encounter: Payer: Self-pay | Admitting: Orthopedic Surgery

## 2021-01-15 ENCOUNTER — Non-Acute Institutional Stay (SKILLED_NURSING_FACILITY): Payer: Medicare Other | Admitting: Orthopedic Surgery

## 2021-01-15 DIAGNOSIS — G301 Alzheimer's disease with late onset: Secondary | ICD-10-CM

## 2021-01-15 DIAGNOSIS — Z86711 Personal history of pulmonary embolism: Secondary | ICD-10-CM

## 2021-01-15 DIAGNOSIS — Z7901 Long term (current) use of anticoagulants: Secondary | ICD-10-CM

## 2021-01-15 DIAGNOSIS — F411 Generalized anxiety disorder: Secondary | ICD-10-CM

## 2021-01-15 DIAGNOSIS — M353 Polymyalgia rheumatica: Secondary | ICD-10-CM

## 2021-01-15 DIAGNOSIS — F0280 Dementia in other diseases classified elsewhere without behavioral disturbance: Secondary | ICD-10-CM

## 2021-01-15 DIAGNOSIS — I10 Essential (primary) hypertension: Secondary | ICD-10-CM

## 2021-01-15 DIAGNOSIS — I25118 Atherosclerotic heart disease of native coronary artery with other forms of angina pectoris: Secondary | ICD-10-CM

## 2021-01-15 DIAGNOSIS — R634 Abnormal weight loss: Secondary | ICD-10-CM

## 2021-01-15 NOTE — Progress Notes (Signed)
Location:  [facility] Room Number: 204/D Place of Service:  SNF (31) Provider:  Windell Moulding, AGNP-C  [name], DO  Patient Care Team: [name], DO as PCP - General (Geriatric Medicine) Rehab, [place] ([place])  Extended Emergency Contact Information Primary Emergency Contact: Chaudhari,[name] Address: [place], [place] [address] [name] of [place] Phone: [phone] Mobile Phone: [phone] Relation: Son Secondary Emergency Contact: [place] of [place] Phone: [phone] Relation: Daughter  Code Status:  DNR Goals of care: Advanced Directive information Advanced Directives 12/25/2020  Does Patient Have a Medical Advance Directive? Yes  Type of Paramedic of [place];Living will;Out of facility DNR (pink MOST or yellow form)  Does patient want to make changes to medical advance directive? No - Patient declined  Copy of [place] in Chart? Yes - validated most recent copy scanned in chart (See row information)  Pre-existing out of facility DNR order (yellow form or pink MOST form) Yellow form placed in chart (order not valid for inpatient use)     Chief Complaint  Patient presents with  . Medical Management of Chronic Issues    HPI:  Pt is a 87 y.o. female seen today for medical management of chronic diseases.    She is a resident of [facility], seen today at bedside. PMH includes: hypertension, CAD, history of pulmonary embolus, dysphagia, Alzheimers disease without behavioral disturbance, back pain, CKD stage III, depression, anxiety, failure to thrive.   Today, she is in bed with a word search puzzle. She is alert to self and familiar faces. Disoriented to time and situation. Follow commands and expresses needs. Reports her granddaughter recently visited and brought  her a new quilt for her bed. She is very happy to discuss her family and shoe off presents she has received. Skin tear to right lower shin has healed, treatment discontinued by facility wound nurse. She complained of chest pain within past month, she was given 2 doses of nitroglycerin and pain subsided. She denies chest pain or sob today. Remains on 2 liters oxygen.   No recent falls, injuries or behavioral outbursts.   Recent blood pressures are as follows:  03/16- 128/74  03/15- 121/72  03/14- 132/78  She has progressively lost about 2 lbs per month in the last 4 months. Remains on soft diet.   Facility nurse does not report any concerns, vitals stable.   Past Medical History:  Diagnosis Date  . Anemia 03/02/2012  . Angina pectoris, unstable ([place]) 03/02/2012  . Anxiety   . Axillary abscess   . Bradycardia 06/13/2012  . CAD (coronary artery disease)    S/p PTCA / stenting (last cath 2004, multiple LAD stents, 2 stents in the right coronary artery all patent)  . Cancer ([place])   . Complication of anesthesia   . Compression fracture of L2 ([place]) 03/12/2017  . Dementia ([place])   . Dysphagia 10/06/2012  . Dyspnea 02/03/2012  . GERD (gastroesophageal reflux disease)   . History of pulmonary embolus (PE) 03/19/2017  . HTN (hypertension)   . PMR (polymyalgia rheumatica) (HCC)   . PONV (postoperative nausea and vomiting)   . Restless leg syndrome   . 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 Started coumadin  . Weakness of both legs 06/09/2012   Past Surgical History:  Procedure Laterality Date  . BREAST LUMPECTOMY    . COLECTOMY    . ESOPHAGOGASTRODUODENOSCOPY (EGD) WITH ESOPHAGEAL DILATION  10/10/2012   Procedure: ESOPHAGOGASTRODUODENOSCOPY (EGD) WITH ESOPHAGEAL DILATION;  Surgeon: Inda Castle, MD;  Location: [facility];  Service: Endoscopy;  Laterality: N/A;  . FEMUR IM NAIL Left 12/13/2018   Procedure: INTRAMEDULLARY (IM) NAIL FEMORAL LEFT;  Surgeon: Paralee Cancel, MD;   Location: [facility];  Service: Orthopedics;  Laterality: Left;  . heart stents  x 8  . INCISION AND DRAINAGE     bilateral axillary, non specific staff  . INCISION AND DRAINAGE ABSCESS  09/28/2012   Procedure: INCISION AND DRAINAGE ABSCESS;  Surgeon: Zenovia Jarred, MD;  Location: [facility];  Service: General;  Laterality: Bilateral;  . stent     cardiac x 8 stents.    Allergies  Allergen Reactions  . Ambien [Zolpidem Tartrate] Nausea And Vomiting  . Codeine   . Codeine Phosphate Nausea And Vomiting  . Darvocet [Propoxyphene N-Acetaminophen] Nausea And Vomiting  . Fish Allergy   . Fish-Derived Products   . Hydrocodone   . Other     Chocolate Flavor.   . Promethazine   . Promethazine Hcl Nausea And Vomiting  . Sulfamethoxazole Other (See Comments)    Pt doesn't remember reaction  . Vicodin [Hydrocodone-Acetaminophen] Other (See Comments)    Unknown reaction  . Amoxicillin Nausea And Vomiting and Rash  . Penicillins Nausea And Vomiting and Rash    Outpatient Encounter Medications as of 01/15/2021  Medication Sig  . acetaminophen (TYLENOL) 325 MG tablet Take 650 mg by mouth every 6 (six) hours as needed for mild pain, moderate pain or fever.  Marland Kitchen albuterol (VENTOLIN HFA) 108 (90 Base) MCG/ACT inhaler Inhale 2 puffs into the lungs every 6 (six) hours as needed for wheezing or shortness of breath.  . ALPRAZolam (XANAX) 0.25 MG tablet Take 0.25 mg by mouth daily in the afternoon.  Marland Kitchen amLODipine (NORVASC) 10 MG tablet Take 10 mg by mouth every morning.   . calcium carbonate (TUMS - DOSED IN MG ELEMENTAL CALCIUM) 500 MG chewable tablet Chew 1 tablet by mouth 3 (three) times daily as needed for indigestion or heartburn.  . Cholecalciferol (VITAMIN D3) 1.25 MG (50000 UT) CAPS Take 1 capsule by mouth once a week.  . donepezil (ARICEPT) 10 MG tablet Take 10 mg by mouth at bedtime.   . Doxepin HCl 3 MG TABS Take 3 mg by mouth at bedtime. For Sleep  . losartan (COZAAR) 50 MG tablet Take 50 mg by  mouth at bedtime.   . nitroGLYCERIN (NITROSTAT) 0.4 MG SL tablet Place 0.4 mg under the tongue every 5 (five) minutes as needed for chest pain.  . NON FORMULARY Diet Order: Mech Soft Liberalized Diet with thin liquids no therapeutic restrictions.  . Nutritional Supplements (NUTRITIONAL SUPPLEMENT PO) Take 1 Container by mouth 2 (two) times a day. Magic Cup - take with lunch and dinner  . ondansetron (ZOFRAN) 4 MG tablet Take 4 mg by mouth every 4 (four) hours as needed for nausea.   . OXYGEN Inhale 2 L/min into the lungs as needed. Wean as able to keep SPO2 greater than 92%  . polyethylene glycol (MIRALAX / GLYCOLAX) packet Take 17 g by mouth daily.  . predniSONE (DELTASONE) 1 MG tablet Take 1 mg by mouth daily with breakfast.   . rOPINIRole (REQUIP) 1 MG tablet 1 mg. Take 1 tablet by mouth twice daily at lunch and at bedtime.  Marland Kitchen  rOPINIRole (REQUIP) 2 MG tablet Take 2 mg by mouth. every evening .2 hours before bedtime  . senna-docusate (SENOKOT-S) 8.6-50 MG tablet Take 1 tablet by mouth 2 (two) times daily.  . traMADol (ULTRAM) 50 MG tablet Take 1 tablet (50 mg total) by mouth 2 (two) times daily.  Marland Kitchen warfarin (COUMADIN) 2.5 MG tablet Take 2.5 mg by mouth daily. On Mon,Wed,Fri,Sun  . warfarin (COUMADIN) 3 MG tablet Take 3 mg by mouth daily. On Tues, Thurs, Sat   No facility-administered encounter medications on file as of 01/15/2021.    Review of Systems  Constitutional: Negative for activity change, appetite change and fatigue.  HENT: Positive for hearing loss and trouble swallowing. Negative for dental problem.   Eyes: Negative for visual disturbance.  Respiratory: Negative for cough, shortness of breath and wheezing.        Oxygen use  Cardiovascular: Positive for chest pain. Negative for leg swelling.  Gastrointestinal: Negative for abdominal distention, abdominal pain, constipation, diarrhea and nausea.  Genitourinary: Negative for dysuria, frequency and hematuria.  Musculoskeletal:  Positive for arthralgias and myalgias.  Skin:       Dry skin  Neurological: Positive for weakness. Negative for dizziness and headaches.  Hematological: Bruises/bleeds easily.  Psychiatric/Behavioral: Positive for confusion. Negative for dysphoric mood. The patient is not nervous/anxious.     Immunization History  Administered Date(s) Administered  . Influenza-Unspecified 08/01/2016, 08/11/2017, 07/02/2018, 08/02/2019, 08/19/2020  . Moderna Sars-Covid-2 Vaccination 11/01/2019, 11/29/2019, 09/18/2020  . PPD Test 03/18/2017  . Pneumococcal Conjugate-13 09/17/2017  . Pneumococcal Polysaccharide-23 04/17/2019   Pertinent  Health Maintenance Due  Topic Date Due  . INFLUENZA VACCINE  Completed  . DEXA SCAN  Completed  . PNA vac Low Risk Adult  Completed   Fall Risk  03/14/2018  Falls in the past year? No   Functional Status Survey:    Vitals:   01/15/21 1340  BP: 130/66  Pulse: 69  Resp: 16  Temp: 98 F (36.7 C)  Weight: 132 lb 4.8 oz (60 kg)   Body mass index is 25 kg/m. Physical Exam Vitals reviewed.  Constitutional:      General: She is not in acute distress. HENT:     Head: Normocephalic.     Right Ear: There is no impacted cerumen.     Left Ear: There is no impacted cerumen.     Mouth/Throat:     Mouth: Mucous membranes are moist.     Comments: Poor dentition Eyes:     General:        Right eye: No discharge.        Left eye: No discharge.  Cardiovascular:     Rate and Rhythm: Normal rate. Rhythm irregular.     Pulses: Normal pulses.     Heart sounds: Normal heart sounds. No murmur heard.   Pulmonary:     Effort: Pulmonary effort is normal. No respiratory distress.     Breath sounds: Normal breath sounds. No wheezing.  Abdominal:     General: Bowel sounds are normal. There is no distension.     Palpations: Abdomen is soft.     Tenderness: There is no abdominal tenderness.  Musculoskeletal:     Cervical back: Normal range of motion.     Right lower  leg: No edema.     Left lower leg: No edema.  Lymphadenopathy:     Cervical: No cervical adenopathy.  Skin:    General: Skin is warm and dry.     Capillary Refill:  Capillary refill takes less than 2 seconds.     Comments: Skin tar to right shin healed  Neurological:     General: No focal deficit present.     Mental Status: She is alert. Mental status is at baseline.     Motor: Weakness present.     Gait: Gait abnormal.     Comments: wheelchair  Psychiatric:        Mood and Affect: Mood normal.        Behavior: Behavior normal.        Cognition and Memory: Memory is impaired.     Labs reviewed: Recent Labs    05/06/20 0000 07/08/20 0000 07/16/20 0000  NA 137 133* 140  K 4.7 4.5 3.7  CL 104 98* 103  CO2 21 22 23*  BUN 27* 18 23*  CREATININE 0.9 0.9 1.0  CALCIUM 8.9 9.2 9.3   No results for input(s): AST, ALT, ALKPHOS, BILITOT, PROT, ALBUMIN in the last 8760 hours. Recent Labs    07/08/20 0000 07/16/20 0000 07/17/20 0000  WBC 8.1 15.1 10.3  HGB 10.3* 9.6* 8.7*  HCT 34* 32* 28*  PLT 351 460* 439*   Lab Results  Component Value Date   TSH 1.71 08/01/2018   Lab Results  Component Value Date   HGBA1C 5.7 08/01/2018   Lab Results  Component Value Date   CHOL 140 08/01/2018   HDL 46 08/01/2018   LDLCALC 80 08/01/2018   TRIG 82 08/01/2018   CHOLHDL 2.4 03/03/2012    Significant Diagnostic Results in last 30 days:  No results found.  Assessment/Plan 1. Late onset Alzheimer's disease without behavioral disturbance ([place]) - no recent outbursts - continues to occupy self with activities, feed self  2. Generalized anxiety disorder - stable with daily xanax  3. Essential hypertension - stable with amlodipine and losartan  4. History of pulmonary embolus (PE) - stable with coumadin regimen - cont weekly INR checks  5. Long term current use of anticoagulant - goal INR 2-3 - cont coumadin regimen  6. Coronary artery disease of native artery of native  heart with stable angina pectoris ([place]) - one episode of chest pain - resolved with two doses nitroglycerin - cont heart healthy soft diet - cont prn nitroglycerin and oxygen  7. Weight loss - continues to lose about 2 lbs a month - cont magic cups and  - hepatic panel  8. PMR (polymyalgia rheumatica) (HCC) - stable with low dose prednisone and daily tramadol    Family/ staff Communication: Plan discussed with patient and facility nurse  Labs/tests ordered:  Hepatic panel

## 2021-01-16 DIAGNOSIS — I2510 Atherosclerotic heart disease of native coronary artery without angina pectoris: Secondary | ICD-10-CM | POA: Diagnosis not present

## 2021-01-16 DIAGNOSIS — I129 Hypertensive chronic kidney disease with stage 1 through stage 4 chronic kidney disease, or unspecified chronic kidney disease: Secondary | ICD-10-CM | POA: Diagnosis not present

## 2021-01-22 DIAGNOSIS — F5101 Primary insomnia: Secondary | ICD-10-CM | POA: Diagnosis not present

## 2021-01-22 DIAGNOSIS — G301 Alzheimer's disease with late onset: Secondary | ICD-10-CM | POA: Diagnosis not present

## 2021-01-22 DIAGNOSIS — F32A Depression, unspecified: Secondary | ICD-10-CM | POA: Diagnosis not present

## 2021-01-22 DIAGNOSIS — F0281 Dementia in other diseases classified elsewhere with behavioral disturbance: Secondary | ICD-10-CM | POA: Diagnosis not present

## 2021-01-23 DIAGNOSIS — F329 Major depressive disorder, single episode, unspecified: Secondary | ICD-10-CM | POA: Diagnosis not present

## 2021-01-23 DIAGNOSIS — I129 Hypertensive chronic kidney disease with stage 1 through stage 4 chronic kidney disease, or unspecified chronic kidney disease: Secondary | ICD-10-CM | POA: Diagnosis not present

## 2021-01-23 DIAGNOSIS — M6281 Muscle weakness (generalized): Secondary | ICD-10-CM | POA: Diagnosis not present

## 2021-01-23 DIAGNOSIS — F419 Anxiety disorder, unspecified: Secondary | ICD-10-CM | POA: Diagnosis not present

## 2021-01-23 DIAGNOSIS — R000 Tachycardia, unspecified: Secondary | ICD-10-CM | POA: Diagnosis not present

## 2021-01-23 DIAGNOSIS — N183 Chronic kidney disease, stage 3 unspecified: Secondary | ICD-10-CM | POA: Diagnosis not present

## 2021-01-23 DIAGNOSIS — M353 Polymyalgia rheumatica: Secondary | ICD-10-CM | POA: Diagnosis not present

## 2021-01-26 DIAGNOSIS — M353 Polymyalgia rheumatica: Secondary | ICD-10-CM | POA: Diagnosis not present

## 2021-01-26 DIAGNOSIS — R000 Tachycardia, unspecified: Secondary | ICD-10-CM | POA: Diagnosis not present

## 2021-01-26 DIAGNOSIS — I129 Hypertensive chronic kidney disease with stage 1 through stage 4 chronic kidney disease, or unspecified chronic kidney disease: Secondary | ICD-10-CM | POA: Diagnosis not present

## 2021-01-26 DIAGNOSIS — N183 Chronic kidney disease, stage 3 unspecified: Secondary | ICD-10-CM | POA: Diagnosis not present

## 2021-01-26 DIAGNOSIS — M6281 Muscle weakness (generalized): Secondary | ICD-10-CM | POA: Diagnosis not present

## 2021-01-27 ENCOUNTER — Other Ambulatory Visit: Payer: Self-pay | Admitting: Orthopedic Surgery

## 2021-01-27 DIAGNOSIS — N183 Chronic kidney disease, stage 3 unspecified: Secondary | ICD-10-CM | POA: Diagnosis not present

## 2021-01-27 DIAGNOSIS — M353 Polymyalgia rheumatica: Secondary | ICD-10-CM

## 2021-01-27 DIAGNOSIS — M6281 Muscle weakness (generalized): Secondary | ICD-10-CM | POA: Diagnosis not present

## 2021-01-27 DIAGNOSIS — I129 Hypertensive chronic kidney disease with stage 1 through stage 4 chronic kidney disease, or unspecified chronic kidney disease: Secondary | ICD-10-CM | POA: Diagnosis not present

## 2021-01-27 DIAGNOSIS — R000 Tachycardia, unspecified: Secondary | ICD-10-CM | POA: Diagnosis not present

## 2021-01-27 MED ORDER — TRAMADOL HCL 50 MG PO TABS: 50.0000 mg | ORAL_TABLET | Freq: Two times a day (BID) | ORAL | 0 refills | Status: DC

## 2021-01-28 DIAGNOSIS — I129 Hypertensive chronic kidney disease with stage 1 through stage 4 chronic kidney disease, or unspecified chronic kidney disease: Secondary | ICD-10-CM | POA: Diagnosis not present

## 2021-01-28 DIAGNOSIS — R000 Tachycardia, unspecified: Secondary | ICD-10-CM | POA: Diagnosis not present

## 2021-01-28 DIAGNOSIS — M6281 Muscle weakness (generalized): Secondary | ICD-10-CM | POA: Diagnosis not present

## 2021-01-28 DIAGNOSIS — M353 Polymyalgia rheumatica: Secondary | ICD-10-CM | POA: Diagnosis not present

## 2021-01-28 DIAGNOSIS — N183 Chronic kidney disease, stage 3 unspecified: Secondary | ICD-10-CM | POA: Diagnosis not present

## 2021-01-29 DIAGNOSIS — N183 Chronic kidney disease, stage 3 unspecified: Secondary | ICD-10-CM | POA: Diagnosis not present

## 2021-01-29 DIAGNOSIS — M6281 Muscle weakness (generalized): Secondary | ICD-10-CM | POA: Diagnosis not present

## 2021-01-29 DIAGNOSIS — R000 Tachycardia, unspecified: Secondary | ICD-10-CM | POA: Diagnosis not present

## 2021-01-29 DIAGNOSIS — I129 Hypertensive chronic kidney disease with stage 1 through stage 4 chronic kidney disease, or unspecified chronic kidney disease: Secondary | ICD-10-CM | POA: Diagnosis not present

## 2021-01-29 DIAGNOSIS — M353 Polymyalgia rheumatica: Secondary | ICD-10-CM | POA: Diagnosis not present

## 2021-01-30 DIAGNOSIS — I129 Hypertensive chronic kidney disease with stage 1 through stage 4 chronic kidney disease, or unspecified chronic kidney disease: Secondary | ICD-10-CM | POA: Diagnosis not present

## 2021-01-30 DIAGNOSIS — M353 Polymyalgia rheumatica: Secondary | ICD-10-CM | POA: Diagnosis not present

## 2021-01-30 DIAGNOSIS — N183 Chronic kidney disease, stage 3 unspecified: Secondary | ICD-10-CM | POA: Diagnosis not present

## 2021-01-30 DIAGNOSIS — M6281 Muscle weakness (generalized): Secondary | ICD-10-CM | POA: Diagnosis not present

## 2021-01-30 DIAGNOSIS — R001 Bradycardia, unspecified: Secondary | ICD-10-CM | POA: Diagnosis not present

## 2021-02-02 DIAGNOSIS — R001 Bradycardia, unspecified: Secondary | ICD-10-CM | POA: Diagnosis not present

## 2021-02-02 DIAGNOSIS — M6281 Muscle weakness (generalized): Secondary | ICD-10-CM | POA: Diagnosis not present

## 2021-02-02 DIAGNOSIS — I129 Hypertensive chronic kidney disease with stage 1 through stage 4 chronic kidney disease, or unspecified chronic kidney disease: Secondary | ICD-10-CM | POA: Diagnosis not present

## 2021-02-02 DIAGNOSIS — N183 Chronic kidney disease, stage 3 unspecified: Secondary | ICD-10-CM | POA: Diagnosis not present

## 2021-02-02 DIAGNOSIS — M353 Polymyalgia rheumatica: Secondary | ICD-10-CM | POA: Diagnosis not present

## 2021-02-05 DIAGNOSIS — F5101 Primary insomnia: Secondary | ICD-10-CM | POA: Diagnosis not present

## 2021-02-05 DIAGNOSIS — G301 Alzheimer's disease with late onset: Secondary | ICD-10-CM | POA: Diagnosis not present

## 2021-02-05 DIAGNOSIS — F32A Depression, unspecified: Secondary | ICD-10-CM | POA: Diagnosis not present

## 2021-02-05 DIAGNOSIS — F0281 Dementia in other diseases classified elsewhere with behavioral disturbance: Secondary | ICD-10-CM | POA: Diagnosis not present

## 2021-02-09 DIAGNOSIS — I129 Hypertensive chronic kidney disease with stage 1 through stage 4 chronic kidney disease, or unspecified chronic kidney disease: Secondary | ICD-10-CM | POA: Diagnosis not present

## 2021-02-09 DIAGNOSIS — J9610 Chronic respiratory failure, unspecified whether with hypoxia or hypercapnia: Secondary | ICD-10-CM | POA: Diagnosis not present

## 2021-02-09 DIAGNOSIS — F0280 Dementia in other diseases classified elsewhere without behavioral disturbance: Secondary | ICD-10-CM | POA: Diagnosis not present

## 2021-02-09 DIAGNOSIS — G309 Alzheimer's disease, unspecified: Secondary | ICD-10-CM | POA: Diagnosis not present

## 2021-02-19 DIAGNOSIS — F32A Depression, unspecified: Secondary | ICD-10-CM | POA: Diagnosis not present

## 2021-02-19 DIAGNOSIS — F5101 Primary insomnia: Secondary | ICD-10-CM | POA: Diagnosis not present

## 2021-02-19 DIAGNOSIS — F0281 Dementia in other diseases classified elsewhere with behavioral disturbance: Secondary | ICD-10-CM | POA: Diagnosis not present

## 2021-02-19 DIAGNOSIS — G301 Alzheimer's disease with late onset: Secondary | ICD-10-CM | POA: Diagnosis not present

## 2021-02-26 DIAGNOSIS — J9610 Chronic respiratory failure, unspecified whether with hypoxia or hypercapnia: Secondary | ICD-10-CM | POA: Diagnosis not present

## 2021-02-26 DIAGNOSIS — F0280 Dementia in other diseases classified elsewhere without behavioral disturbance: Secondary | ICD-10-CM | POA: Diagnosis not present

## 2021-02-26 DIAGNOSIS — E440 Moderate protein-calorie malnutrition: Secondary | ICD-10-CM | POA: Diagnosis not present

## 2021-02-26 DIAGNOSIS — G309 Alzheimer's disease, unspecified: Secondary | ICD-10-CM | POA: Diagnosis not present

## 2021-03-05 DIAGNOSIS — F32A Depression, unspecified: Secondary | ICD-10-CM | POA: Diagnosis not present

## 2021-03-05 DIAGNOSIS — F0281 Dementia in other diseases classified elsewhere with behavioral disturbance: Secondary | ICD-10-CM | POA: Diagnosis not present

## 2021-03-05 DIAGNOSIS — G301 Alzheimer's disease with late onset: Secondary | ICD-10-CM | POA: Diagnosis not present

## 2021-03-05 DIAGNOSIS — F5101 Primary insomnia: Secondary | ICD-10-CM | POA: Diagnosis not present

## 2021-04-01 DIAGNOSIS — F0280 Dementia in other diseases classified elsewhere without behavioral disturbance: Secondary | ICD-10-CM | POA: Diagnosis not present

## 2021-04-01 DIAGNOSIS — N1831 Chronic kidney disease, stage 3a: Secondary | ICD-10-CM | POA: Diagnosis not present

## 2021-04-01 DIAGNOSIS — G309 Alzheimer's disease, unspecified: Secondary | ICD-10-CM | POA: Diagnosis not present

## 2021-04-01 DIAGNOSIS — Z86711 Personal history of pulmonary embolism: Secondary | ICD-10-CM | POA: Diagnosis not present

## 2021-04-04 DIAGNOSIS — M6281 Muscle weakness (generalized): Secondary | ICD-10-CM | POA: Diagnosis not present

## 2021-04-04 DIAGNOSIS — M353 Polymyalgia rheumatica: Secondary | ICD-10-CM | POA: Diagnosis not present

## 2021-04-04 DIAGNOSIS — G301 Alzheimer's disease with late onset: Secondary | ICD-10-CM | POA: Diagnosis not present

## 2021-04-05 DIAGNOSIS — G301 Alzheimer's disease with late onset: Secondary | ICD-10-CM | POA: Diagnosis not present

## 2021-04-05 DIAGNOSIS — F32A Depression, unspecified: Secondary | ICD-10-CM | POA: Diagnosis not present

## 2021-04-05 DIAGNOSIS — F0281 Dementia in other diseases classified elsewhere with behavioral disturbance: Secondary | ICD-10-CM | POA: Diagnosis not present

## 2021-04-05 DIAGNOSIS — F5101 Primary insomnia: Secondary | ICD-10-CM | POA: Diagnosis not present

## 2021-04-06 DIAGNOSIS — G301 Alzheimer's disease with late onset: Secondary | ICD-10-CM | POA: Diagnosis not present

## 2021-04-06 DIAGNOSIS — M6281 Muscle weakness (generalized): Secondary | ICD-10-CM | POA: Diagnosis not present

## 2021-04-06 DIAGNOSIS — M353 Polymyalgia rheumatica: Secondary | ICD-10-CM | POA: Diagnosis not present

## 2021-04-07 DIAGNOSIS — G301 Alzheimer's disease with late onset: Secondary | ICD-10-CM | POA: Diagnosis not present

## 2021-04-07 DIAGNOSIS — M353 Polymyalgia rheumatica: Secondary | ICD-10-CM | POA: Diagnosis not present

## 2021-04-07 DIAGNOSIS — M6281 Muscle weakness (generalized): Secondary | ICD-10-CM | POA: Diagnosis not present

## 2021-04-08 DIAGNOSIS — G301 Alzheimer's disease with late onset: Secondary | ICD-10-CM | POA: Diagnosis not present

## 2021-04-08 DIAGNOSIS — M353 Polymyalgia rheumatica: Secondary | ICD-10-CM | POA: Diagnosis not present

## 2021-04-08 DIAGNOSIS — M6281 Muscle weakness (generalized): Secondary | ICD-10-CM | POA: Diagnosis not present

## 2021-04-09 DIAGNOSIS — M6281 Muscle weakness (generalized): Secondary | ICD-10-CM | POA: Diagnosis not present

## 2021-04-09 DIAGNOSIS — M353 Polymyalgia rheumatica: Secondary | ICD-10-CM | POA: Diagnosis not present

## 2021-04-09 DIAGNOSIS — G301 Alzheimer's disease with late onset: Secondary | ICD-10-CM | POA: Diagnosis not present

## 2021-04-10 DIAGNOSIS — M353 Polymyalgia rheumatica: Secondary | ICD-10-CM | POA: Diagnosis not present

## 2021-04-10 DIAGNOSIS — G301 Alzheimer's disease with late onset: Secondary | ICD-10-CM | POA: Diagnosis not present

## 2021-04-10 DIAGNOSIS — M6281 Muscle weakness (generalized): Secondary | ICD-10-CM | POA: Diagnosis not present

## 2021-04-11 DIAGNOSIS — G301 Alzheimer's disease with late onset: Secondary | ICD-10-CM | POA: Diagnosis not present

## 2021-04-11 DIAGNOSIS — M6281 Muscle weakness (generalized): Secondary | ICD-10-CM | POA: Diagnosis not present

## 2021-04-11 DIAGNOSIS — M353 Polymyalgia rheumatica: Secondary | ICD-10-CM | POA: Diagnosis not present

## 2021-04-13 DIAGNOSIS — M6281 Muscle weakness (generalized): Secondary | ICD-10-CM | POA: Diagnosis not present

## 2021-04-13 DIAGNOSIS — G301 Alzheimer's disease with late onset: Secondary | ICD-10-CM | POA: Diagnosis not present

## 2021-04-13 DIAGNOSIS — R9431 Abnormal electrocardiogram [ECG] [EKG]: Secondary | ICD-10-CM | POA: Diagnosis not present

## 2021-04-13 DIAGNOSIS — M353 Polymyalgia rheumatica: Secondary | ICD-10-CM | POA: Diagnosis not present

## 2021-04-14 DIAGNOSIS — M6281 Muscle weakness (generalized): Secondary | ICD-10-CM | POA: Diagnosis not present

## 2021-04-14 DIAGNOSIS — G301 Alzheimer's disease with late onset: Secondary | ICD-10-CM | POA: Diagnosis not present

## 2021-04-14 DIAGNOSIS — M353 Polymyalgia rheumatica: Secondary | ICD-10-CM | POA: Diagnosis not present

## 2021-04-15 DIAGNOSIS — M6281 Muscle weakness (generalized): Secondary | ICD-10-CM | POA: Diagnosis not present

## 2021-04-15 DIAGNOSIS — G301 Alzheimer's disease with late onset: Secondary | ICD-10-CM | POA: Diagnosis not present

## 2021-04-15 DIAGNOSIS — M353 Polymyalgia rheumatica: Secondary | ICD-10-CM | POA: Diagnosis not present

## 2021-04-16 DIAGNOSIS — M353 Polymyalgia rheumatica: Secondary | ICD-10-CM | POA: Diagnosis not present

## 2021-04-16 DIAGNOSIS — M6281 Muscle weakness (generalized): Secondary | ICD-10-CM | POA: Diagnosis not present

## 2021-04-16 DIAGNOSIS — G301 Alzheimer's disease with late onset: Secondary | ICD-10-CM | POA: Diagnosis not present

## 2021-04-17 DIAGNOSIS — M353 Polymyalgia rheumatica: Secondary | ICD-10-CM | POA: Diagnosis not present

## 2021-04-17 DIAGNOSIS — G301 Alzheimer's disease with late onset: Secondary | ICD-10-CM | POA: Diagnosis not present

## 2021-04-17 DIAGNOSIS — M6281 Muscle weakness (generalized): Secondary | ICD-10-CM | POA: Diagnosis not present

## 2021-04-20 DIAGNOSIS — M353 Polymyalgia rheumatica: Secondary | ICD-10-CM | POA: Diagnosis not present

## 2021-04-20 DIAGNOSIS — M6281 Muscle weakness (generalized): Secondary | ICD-10-CM | POA: Diagnosis not present

## 2021-04-20 DIAGNOSIS — G301 Alzheimer's disease with late onset: Secondary | ICD-10-CM | POA: Diagnosis not present

## 2021-04-21 DIAGNOSIS — M6281 Muscle weakness (generalized): Secondary | ICD-10-CM | POA: Diagnosis not present

## 2021-04-21 DIAGNOSIS — M353 Polymyalgia rheumatica: Secondary | ICD-10-CM | POA: Diagnosis not present

## 2021-04-21 DIAGNOSIS — G301 Alzheimer's disease with late onset: Secondary | ICD-10-CM | POA: Diagnosis not present

## 2021-04-22 DIAGNOSIS — G301 Alzheimer's disease with late onset: Secondary | ICD-10-CM | POA: Diagnosis not present

## 2021-04-22 DIAGNOSIS — M6281 Muscle weakness (generalized): Secondary | ICD-10-CM | POA: Diagnosis not present

## 2021-04-22 DIAGNOSIS — M353 Polymyalgia rheumatica: Secondary | ICD-10-CM | POA: Diagnosis not present

## 2021-04-28 DIAGNOSIS — M2042 Other hammer toe(s) (acquired), left foot: Secondary | ICD-10-CM | POA: Diagnosis not present

## 2021-04-28 DIAGNOSIS — M2041 Other hammer toe(s) (acquired), right foot: Secondary | ICD-10-CM | POA: Diagnosis not present

## 2021-04-28 DIAGNOSIS — I739 Peripheral vascular disease, unspecified: Secondary | ICD-10-CM | POA: Diagnosis not present

## 2021-04-28 DIAGNOSIS — B351 Tinea unguium: Secondary | ICD-10-CM | POA: Diagnosis not present

## 2021-05-01 DIAGNOSIS — F0280 Dementia in other diseases classified elsewhere without behavioral disturbance: Secondary | ICD-10-CM | POA: Diagnosis not present

## 2021-05-01 DIAGNOSIS — D6869 Other thrombophilia: Secondary | ICD-10-CM | POA: Diagnosis not present

## 2021-05-01 DIAGNOSIS — I2510 Atherosclerotic heart disease of native coronary artery without angina pectoris: Secondary | ICD-10-CM | POA: Diagnosis not present

## 2021-05-01 DIAGNOSIS — G309 Alzheimer's disease, unspecified: Secondary | ICD-10-CM | POA: Diagnosis not present

## 2021-05-02 DIAGNOSIS — I10 Essential (primary) hypertension: Secondary | ICD-10-CM | POA: Diagnosis not present

## 2021-05-28 DIAGNOSIS — F0281 Dementia in other diseases classified elsewhere with behavioral disturbance: Secondary | ICD-10-CM | POA: Diagnosis not present

## 2021-05-28 DIAGNOSIS — G301 Alzheimer's disease with late onset: Secondary | ICD-10-CM | POA: Diagnosis not present

## 2021-05-28 DIAGNOSIS — F5101 Primary insomnia: Secondary | ICD-10-CM | POA: Diagnosis not present

## 2021-05-28 DIAGNOSIS — F32A Depression, unspecified: Secondary | ICD-10-CM | POA: Diagnosis not present

## 2021-06-02 DIAGNOSIS — R1312 Dysphagia, oropharyngeal phase: Secondary | ICD-10-CM | POA: Diagnosis not present

## 2021-06-03 DIAGNOSIS — R1312 Dysphagia, oropharyngeal phase: Secondary | ICD-10-CM | POA: Diagnosis not present

## 2021-06-03 DIAGNOSIS — Z0000 Encounter for general adult medical examination without abnormal findings: Secondary | ICD-10-CM | POA: Diagnosis not present

## 2021-06-03 DIAGNOSIS — Z139 Encounter for screening, unspecified: Secondary | ICD-10-CM | POA: Diagnosis not present

## 2021-06-03 DIAGNOSIS — Z1331 Encounter for screening for depression: Secondary | ICD-10-CM | POA: Diagnosis not present

## 2021-06-03 DIAGNOSIS — R300 Dysuria: Secondary | ICD-10-CM | POA: Diagnosis not present

## 2021-06-04 DIAGNOSIS — F0280 Dementia in other diseases classified elsewhere without behavioral disturbance: Secondary | ICD-10-CM | POA: Diagnosis not present

## 2021-06-04 DIAGNOSIS — Z86711 Personal history of pulmonary embolism: Secondary | ICD-10-CM | POA: Diagnosis not present

## 2021-06-04 DIAGNOSIS — D6869 Other thrombophilia: Secondary | ICD-10-CM | POA: Diagnosis not present

## 2021-06-04 DIAGNOSIS — G309 Alzheimer's disease, unspecified: Secondary | ICD-10-CM | POA: Diagnosis not present

## 2021-06-05 DIAGNOSIS — N390 Urinary tract infection, site not specified: Secondary | ICD-10-CM | POA: Diagnosis not present

## 2021-06-05 DIAGNOSIS — R1312 Dysphagia, oropharyngeal phase: Secondary | ICD-10-CM | POA: Diagnosis not present

## 2021-06-06 DIAGNOSIS — R1312 Dysphagia, oropharyngeal phase: Secondary | ICD-10-CM | POA: Diagnosis not present

## 2021-06-08 DIAGNOSIS — R1312 Dysphagia, oropharyngeal phase: Secondary | ICD-10-CM | POA: Diagnosis not present

## 2021-06-10 DIAGNOSIS — N390 Urinary tract infection, site not specified: Secondary | ICD-10-CM | POA: Diagnosis not present

## 2021-06-11 DIAGNOSIS — R1312 Dysphagia, oropharyngeal phase: Secondary | ICD-10-CM | POA: Diagnosis not present

## 2021-06-12 DIAGNOSIS — R1312 Dysphagia, oropharyngeal phase: Secondary | ICD-10-CM | POA: Diagnosis not present

## 2021-06-13 DIAGNOSIS — R1312 Dysphagia, oropharyngeal phase: Secondary | ICD-10-CM | POA: Diagnosis not present

## 2021-06-14 DIAGNOSIS — R1312 Dysphagia, oropharyngeal phase: Secondary | ICD-10-CM | POA: Diagnosis not present

## 2021-06-15 DIAGNOSIS — H6123 Impacted cerumen, bilateral: Secondary | ICD-10-CM | POA: Diagnosis not present

## 2021-06-15 DIAGNOSIS — R1312 Dysphagia, oropharyngeal phase: Secondary | ICD-10-CM | POA: Diagnosis not present

## 2021-06-18 DIAGNOSIS — F0281 Dementia in other diseases classified elsewhere with behavioral disturbance: Secondary | ICD-10-CM | POA: Diagnosis not present

## 2021-06-18 DIAGNOSIS — G301 Alzheimer's disease with late onset: Secondary | ICD-10-CM | POA: Diagnosis not present

## 2021-06-18 DIAGNOSIS — R1312 Dysphagia, oropharyngeal phase: Secondary | ICD-10-CM | POA: Diagnosis not present

## 2021-06-18 DIAGNOSIS — F339 Major depressive disorder, recurrent, unspecified: Secondary | ICD-10-CM | POA: Diagnosis not present

## 2021-06-18 DIAGNOSIS — F5101 Primary insomnia: Secondary | ICD-10-CM | POA: Diagnosis not present

## 2021-06-19 DIAGNOSIS — R1312 Dysphagia, oropharyngeal phase: Secondary | ICD-10-CM | POA: Diagnosis not present

## 2021-06-20 DIAGNOSIS — R1312 Dysphagia, oropharyngeal phase: Secondary | ICD-10-CM | POA: Diagnosis not present

## 2021-06-21 DIAGNOSIS — R1312 Dysphagia, oropharyngeal phase: Secondary | ICD-10-CM | POA: Diagnosis not present

## 2021-06-22 DIAGNOSIS — R1312 Dysphagia, oropharyngeal phase: Secondary | ICD-10-CM | POA: Diagnosis not present

## 2021-06-24 DIAGNOSIS — F5101 Primary insomnia: Secondary | ICD-10-CM | POA: Diagnosis not present

## 2021-06-24 DIAGNOSIS — R1312 Dysphagia, oropharyngeal phase: Secondary | ICD-10-CM | POA: Diagnosis not present

## 2021-06-24 DIAGNOSIS — F32A Depression, unspecified: Secondary | ICD-10-CM | POA: Diagnosis not present

## 2021-06-26 DIAGNOSIS — R1312 Dysphagia, oropharyngeal phase: Secondary | ICD-10-CM | POA: Diagnosis not present

## 2021-06-27 DIAGNOSIS — R1312 Dysphagia, oropharyngeal phase: Secondary | ICD-10-CM | POA: Diagnosis not present

## 2021-07-02 DIAGNOSIS — F32A Depression, unspecified: Secondary | ICD-10-CM | POA: Diagnosis not present

## 2021-07-02 DIAGNOSIS — F5101 Primary insomnia: Secondary | ICD-10-CM | POA: Diagnosis not present

## 2021-07-09 DIAGNOSIS — F0280 Dementia in other diseases classified elsewhere without behavioral disturbance: Secondary | ICD-10-CM | POA: Diagnosis not present

## 2021-07-09 DIAGNOSIS — J9610 Chronic respiratory failure, unspecified whether with hypoxia or hypercapnia: Secondary | ICD-10-CM | POA: Diagnosis not present

## 2021-07-09 DIAGNOSIS — G309 Alzheimer's disease, unspecified: Secondary | ICD-10-CM | POA: Diagnosis not present

## 2021-07-09 DIAGNOSIS — M353 Polymyalgia rheumatica: Secondary | ICD-10-CM | POA: Diagnosis not present

## 2021-07-10 DIAGNOSIS — Z7901 Long term (current) use of anticoagulants: Secondary | ICD-10-CM | POA: Diagnosis not present

## 2021-07-10 DIAGNOSIS — D649 Anemia, unspecified: Secondary | ICD-10-CM | POA: Diagnosis not present

## 2021-07-10 DIAGNOSIS — I10 Essential (primary) hypertension: Secondary | ICD-10-CM | POA: Diagnosis not present

## 2021-07-16 DIAGNOSIS — F5101 Primary insomnia: Secondary | ICD-10-CM | POA: Diagnosis not present

## 2021-07-16 DIAGNOSIS — F0281 Dementia in other diseases classified elsewhere with behavioral disturbance: Secondary | ICD-10-CM | POA: Diagnosis not present

## 2021-07-16 DIAGNOSIS — G301 Alzheimer's disease with late onset: Secondary | ICD-10-CM | POA: Diagnosis not present

## 2021-07-16 DIAGNOSIS — F339 Major depressive disorder, recurrent, unspecified: Secondary | ICD-10-CM | POA: Diagnosis not present

## 2021-07-21 DIAGNOSIS — F32A Depression, unspecified: Secondary | ICD-10-CM | POA: Diagnosis not present

## 2021-07-21 DIAGNOSIS — F5101 Primary insomnia: Secondary | ICD-10-CM | POA: Diagnosis not present

## 2021-08-04 DIAGNOSIS — F32A Depression, unspecified: Secondary | ICD-10-CM | POA: Diagnosis not present

## 2021-08-04 DIAGNOSIS — F5101 Primary insomnia: Secondary | ICD-10-CM | POA: Diagnosis not present

## 2021-08-11 DIAGNOSIS — R627 Adult failure to thrive: Secondary | ICD-10-CM | POA: Diagnosis not present

## 2021-08-11 DIAGNOSIS — N1831 Chronic kidney disease, stage 3a: Secondary | ICD-10-CM | POA: Diagnosis not present

## 2021-08-11 DIAGNOSIS — D631 Anemia in chronic kidney disease: Secondary | ICD-10-CM | POA: Diagnosis not present

## 2021-08-11 DIAGNOSIS — E440 Moderate protein-calorie malnutrition: Secondary | ICD-10-CM | POA: Diagnosis not present

## 2021-08-14 DIAGNOSIS — M205X9 Other deformities of toe(s) (acquired), unspecified foot: Secondary | ICD-10-CM | POA: Diagnosis not present

## 2021-08-14 DIAGNOSIS — S91309A Unspecified open wound, unspecified foot, initial encounter: Secondary | ICD-10-CM | POA: Diagnosis not present

## 2021-08-27 DIAGNOSIS — G301 Alzheimer's disease with late onset: Secondary | ICD-10-CM | POA: Diagnosis not present

## 2021-08-27 DIAGNOSIS — F339 Major depressive disorder, recurrent, unspecified: Secondary | ICD-10-CM | POA: Diagnosis not present

## 2021-08-27 DIAGNOSIS — F02B18 Dementia in other diseases classified elsewhere, moderate, with other behavioral disturbance: Secondary | ICD-10-CM | POA: Diagnosis not present

## 2021-08-27 DIAGNOSIS — F5101 Primary insomnia: Secondary | ICD-10-CM | POA: Diagnosis not present

## 2021-09-03 DIAGNOSIS — L602 Onychogryphosis: Secondary | ICD-10-CM | POA: Diagnosis not present

## 2021-09-03 DIAGNOSIS — I739 Peripheral vascular disease, unspecified: Secondary | ICD-10-CM | POA: Diagnosis not present

## 2021-09-03 DIAGNOSIS — L97522 Non-pressure chronic ulcer of other part of left foot with fat layer exposed: Secondary | ICD-10-CM | POA: Diagnosis not present

## 2021-09-08 DIAGNOSIS — F32A Depression, unspecified: Secondary | ICD-10-CM | POA: Diagnosis not present

## 2021-09-08 DIAGNOSIS — F5101 Primary insomnia: Secondary | ICD-10-CM | POA: Diagnosis not present

## 2021-09-10 DIAGNOSIS — F339 Major depressive disorder, recurrent, unspecified: Secondary | ICD-10-CM | POA: Diagnosis not present

## 2021-09-10 DIAGNOSIS — F5101 Primary insomnia: Secondary | ICD-10-CM | POA: Diagnosis not present

## 2021-09-10 DIAGNOSIS — G301 Alzheimer's disease with late onset: Secondary | ICD-10-CM | POA: Diagnosis not present

## 2021-09-10 DIAGNOSIS — F02B18 Dementia in other diseases classified elsewhere, moderate, with other behavioral disturbance: Secondary | ICD-10-CM | POA: Diagnosis not present

## 2021-09-14 DIAGNOSIS — G309 Alzheimer's disease, unspecified: Secondary | ICD-10-CM | POA: Diagnosis not present

## 2021-09-14 DIAGNOSIS — D6869 Other thrombophilia: Secondary | ICD-10-CM | POA: Diagnosis not present

## 2021-09-14 DIAGNOSIS — F0280 Dementia in other diseases classified elsewhere without behavioral disturbance: Secondary | ICD-10-CM | POA: Diagnosis not present

## 2021-09-14 DIAGNOSIS — J9610 Chronic respiratory failure, unspecified whether with hypoxia or hypercapnia: Secondary | ICD-10-CM | POA: Diagnosis not present

## 2021-09-15 DIAGNOSIS — F5101 Primary insomnia: Secondary | ICD-10-CM | POA: Diagnosis not present

## 2021-09-15 DIAGNOSIS — F32A Depression, unspecified: Secondary | ICD-10-CM | POA: Diagnosis not present

## 2021-09-21 DIAGNOSIS — R41841 Cognitive communication deficit: Secondary | ICD-10-CM | POA: Diagnosis not present

## 2021-09-21 DIAGNOSIS — R1312 Dysphagia, oropharyngeal phase: Secondary | ICD-10-CM | POA: Diagnosis not present

## 2021-09-22 DIAGNOSIS — R1312 Dysphagia, oropharyngeal phase: Secondary | ICD-10-CM | POA: Diagnosis not present

## 2021-09-22 DIAGNOSIS — R41841 Cognitive communication deficit: Secondary | ICD-10-CM | POA: Diagnosis not present

## 2021-09-23 DIAGNOSIS — R1312 Dysphagia, oropharyngeal phase: Secondary | ICD-10-CM | POA: Diagnosis not present

## 2021-09-23 DIAGNOSIS — R41841 Cognitive communication deficit: Secondary | ICD-10-CM | POA: Diagnosis not present

## 2021-09-25 DIAGNOSIS — R41841 Cognitive communication deficit: Secondary | ICD-10-CM | POA: Diagnosis not present

## 2021-09-25 DIAGNOSIS — R1312 Dysphagia, oropharyngeal phase: Secondary | ICD-10-CM | POA: Diagnosis not present

## 2021-09-26 DIAGNOSIS — R41841 Cognitive communication deficit: Secondary | ICD-10-CM | POA: Diagnosis not present

## 2021-09-26 DIAGNOSIS — R1312 Dysphagia, oropharyngeal phase: Secondary | ICD-10-CM | POA: Diagnosis not present

## 2021-09-28 DIAGNOSIS — R1312 Dysphagia, oropharyngeal phase: Secondary | ICD-10-CM | POA: Diagnosis not present

## 2021-09-28 DIAGNOSIS — R41841 Cognitive communication deficit: Secondary | ICD-10-CM | POA: Diagnosis not present

## 2021-09-29 DIAGNOSIS — R41841 Cognitive communication deficit: Secondary | ICD-10-CM | POA: Diagnosis not present

## 2021-09-29 DIAGNOSIS — R1312 Dysphagia, oropharyngeal phase: Secondary | ICD-10-CM | POA: Diagnosis not present

## 2021-09-30 DIAGNOSIS — R41841 Cognitive communication deficit: Secondary | ICD-10-CM | POA: Diagnosis not present

## 2021-09-30 DIAGNOSIS — R1312 Dysphagia, oropharyngeal phase: Secondary | ICD-10-CM | POA: Diagnosis not present

## 2021-10-01 DIAGNOSIS — R059 Cough, unspecified: Secondary | ICD-10-CM | POA: Diagnosis not present

## 2021-10-01 DIAGNOSIS — R1312 Dysphagia, oropharyngeal phase: Secondary | ICD-10-CM | POA: Diagnosis not present

## 2021-10-01 DIAGNOSIS — R41841 Cognitive communication deficit: Secondary | ICD-10-CM | POA: Diagnosis not present

## 2021-10-01 DIAGNOSIS — R0989 Other specified symptoms and signs involving the circulatory and respiratory systems: Secondary | ICD-10-CM | POA: Diagnosis not present

## 2021-10-02 DIAGNOSIS — R1312 Dysphagia, oropharyngeal phase: Secondary | ICD-10-CM | POA: Diagnosis not present

## 2021-10-02 DIAGNOSIS — R41841 Cognitive communication deficit: Secondary | ICD-10-CM | POA: Diagnosis not present

## 2021-10-04 DIAGNOSIS — F5101 Primary insomnia: Secondary | ICD-10-CM | POA: Diagnosis not present

## 2021-10-04 DIAGNOSIS — F339 Major depressive disorder, recurrent, unspecified: Secondary | ICD-10-CM | POA: Diagnosis not present

## 2021-10-04 DIAGNOSIS — G301 Alzheimer's disease with late onset: Secondary | ICD-10-CM | POA: Diagnosis not present

## 2021-10-04 DIAGNOSIS — F02B18 Dementia in other diseases classified elsewhere, moderate, with other behavioral disturbance: Secondary | ICD-10-CM | POA: Diagnosis not present

## 2021-10-05 DIAGNOSIS — R41841 Cognitive communication deficit: Secondary | ICD-10-CM | POA: Diagnosis not present

## 2021-10-05 DIAGNOSIS — R1312 Dysphagia, oropharyngeal phase: Secondary | ICD-10-CM | POA: Diagnosis not present

## 2021-10-06 DIAGNOSIS — R1312 Dysphagia, oropharyngeal phase: Secondary | ICD-10-CM | POA: Diagnosis not present

## 2021-10-06 DIAGNOSIS — R41841 Cognitive communication deficit: Secondary | ICD-10-CM | POA: Diagnosis not present

## 2021-10-07 DIAGNOSIS — R1312 Dysphagia, oropharyngeal phase: Secondary | ICD-10-CM | POA: Diagnosis not present

## 2021-10-07 DIAGNOSIS — R41841 Cognitive communication deficit: Secondary | ICD-10-CM | POA: Diagnosis not present

## 2021-10-09 DIAGNOSIS — R41841 Cognitive communication deficit: Secondary | ICD-10-CM | POA: Diagnosis not present

## 2021-10-09 DIAGNOSIS — R1312 Dysphagia, oropharyngeal phase: Secondary | ICD-10-CM | POA: Diagnosis not present

## 2021-10-10 DIAGNOSIS — R1312 Dysphagia, oropharyngeal phase: Secondary | ICD-10-CM | POA: Diagnosis not present

## 2021-10-10 DIAGNOSIS — R41841 Cognitive communication deficit: Secondary | ICD-10-CM | POA: Diagnosis not present

## 2021-10-12 DIAGNOSIS — R1312 Dysphagia, oropharyngeal phase: Secondary | ICD-10-CM | POA: Diagnosis not present

## 2021-10-12 DIAGNOSIS — R41841 Cognitive communication deficit: Secondary | ICD-10-CM | POA: Diagnosis not present

## 2021-10-13 DIAGNOSIS — R1312 Dysphagia, oropharyngeal phase: Secondary | ICD-10-CM | POA: Diagnosis not present

## 2021-10-13 DIAGNOSIS — F5101 Primary insomnia: Secondary | ICD-10-CM | POA: Diagnosis not present

## 2021-10-13 DIAGNOSIS — R41841 Cognitive communication deficit: Secondary | ICD-10-CM | POA: Diagnosis not present

## 2021-10-13 DIAGNOSIS — F339 Major depressive disorder, recurrent, unspecified: Secondary | ICD-10-CM | POA: Diagnosis not present

## 2021-10-15 DIAGNOSIS — R1312 Dysphagia, oropharyngeal phase: Secondary | ICD-10-CM | POA: Diagnosis not present

## 2021-10-15 DIAGNOSIS — R41841 Cognitive communication deficit: Secondary | ICD-10-CM | POA: Diagnosis not present

## 2021-10-15 DIAGNOSIS — R059 Cough, unspecified: Secondary | ICD-10-CM | POA: Diagnosis not present

## 2021-10-16 DIAGNOSIS — I10 Essential (primary) hypertension: Secondary | ICD-10-CM | POA: Diagnosis not present

## 2021-10-16 DIAGNOSIS — N390 Urinary tract infection, site not specified: Secondary | ICD-10-CM | POA: Diagnosis not present

## 2021-10-18 DIAGNOSIS — R1312 Dysphagia, oropharyngeal phase: Secondary | ICD-10-CM | POA: Diagnosis not present

## 2021-10-18 DIAGNOSIS — R41841 Cognitive communication deficit: Secondary | ICD-10-CM | POA: Diagnosis not present

## 2021-10-19 DIAGNOSIS — R41841 Cognitive communication deficit: Secondary | ICD-10-CM | POA: Diagnosis not present

## 2021-10-19 DIAGNOSIS — R1312 Dysphagia, oropharyngeal phase: Secondary | ICD-10-CM | POA: Diagnosis not present

## 2021-10-20 DIAGNOSIS — R1312 Dysphagia, oropharyngeal phase: Secondary | ICD-10-CM | POA: Diagnosis not present

## 2021-10-20 DIAGNOSIS — R41841 Cognitive communication deficit: Secondary | ICD-10-CM | POA: Diagnosis not present

## 2021-10-21 DIAGNOSIS — R1312 Dysphagia, oropharyngeal phase: Secondary | ICD-10-CM | POA: Diagnosis not present

## 2021-10-21 DIAGNOSIS — R41841 Cognitive communication deficit: Secondary | ICD-10-CM | POA: Diagnosis not present

## 2021-10-22 DIAGNOSIS — G301 Alzheimer's disease with late onset: Secondary | ICD-10-CM | POA: Diagnosis not present

## 2021-10-22 DIAGNOSIS — F5101 Primary insomnia: Secondary | ICD-10-CM | POA: Diagnosis not present

## 2021-10-22 DIAGNOSIS — R1312 Dysphagia, oropharyngeal phase: Secondary | ICD-10-CM | POA: Diagnosis not present

## 2021-10-22 DIAGNOSIS — F02B18 Dementia in other diseases classified elsewhere, moderate, with other behavioral disturbance: Secondary | ICD-10-CM | POA: Diagnosis not present

## 2021-10-22 DIAGNOSIS — R41841 Cognitive communication deficit: Secondary | ICD-10-CM | POA: Diagnosis not present

## 2021-10-22 DIAGNOSIS — F339 Major depressive disorder, recurrent, unspecified: Secondary | ICD-10-CM | POA: Diagnosis not present

## 2021-10-23 DIAGNOSIS — R1312 Dysphagia, oropharyngeal phase: Secondary | ICD-10-CM | POA: Diagnosis not present

## 2021-10-23 DIAGNOSIS — R41841 Cognitive communication deficit: Secondary | ICD-10-CM | POA: Diagnosis not present

## 2021-10-29 DIAGNOSIS — M353 Polymyalgia rheumatica: Secondary | ICD-10-CM | POA: Diagnosis not present

## 2021-10-29 DIAGNOSIS — N1831 Chronic kidney disease, stage 3a: Secondary | ICD-10-CM | POA: Diagnosis not present

## 2021-10-29 DIAGNOSIS — D631 Anemia in chronic kidney disease: Secondary | ICD-10-CM | POA: Diagnosis not present

## 2021-10-29 DIAGNOSIS — J9610 Chronic respiratory failure, unspecified whether with hypoxia or hypercapnia: Secondary | ICD-10-CM | POA: Diagnosis not present

## 2021-11-05 DIAGNOSIS — F02B18 Dementia in other diseases classified elsewhere, moderate, with other behavioral disturbance: Secondary | ICD-10-CM | POA: Diagnosis not present

## 2021-11-05 DIAGNOSIS — F339 Major depressive disorder, recurrent, unspecified: Secondary | ICD-10-CM | POA: Diagnosis not present

## 2021-11-05 DIAGNOSIS — G301 Alzheimer's disease with late onset: Secondary | ICD-10-CM | POA: Diagnosis not present

## 2021-11-05 DIAGNOSIS — F5101 Primary insomnia: Secondary | ICD-10-CM | POA: Diagnosis not present

## 2021-11-10 DIAGNOSIS — F32A Depression, unspecified: Secondary | ICD-10-CM | POA: Diagnosis not present

## 2021-11-10 DIAGNOSIS — F5101 Primary insomnia: Secondary | ICD-10-CM | POA: Diagnosis not present

## 2021-11-12 DIAGNOSIS — H35033 Hypertensive retinopathy, bilateral: Secondary | ICD-10-CM | POA: Diagnosis not present

## 2021-11-12 DIAGNOSIS — M79672 Pain in left foot: Secondary | ICD-10-CM | POA: Diagnosis not present

## 2021-11-12 DIAGNOSIS — H43813 Vitreous degeneration, bilateral: Secondary | ICD-10-CM | POA: Diagnosis not present

## 2021-11-12 DIAGNOSIS — H04123 Dry eye syndrome of bilateral lacrimal glands: Secondary | ICD-10-CM | POA: Diagnosis not present

## 2021-11-12 DIAGNOSIS — H353131 Nonexudative age-related macular degeneration, bilateral, early dry stage: Secondary | ICD-10-CM | POA: Diagnosis not present

## 2021-11-16 DIAGNOSIS — F32A Depression, unspecified: Secondary | ICD-10-CM | POA: Diagnosis not present

## 2021-11-16 DIAGNOSIS — F5101 Primary insomnia: Secondary | ICD-10-CM | POA: Diagnosis not present

## 2021-11-17 DIAGNOSIS — M79672 Pain in left foot: Secondary | ICD-10-CM | POA: Diagnosis not present

## 2021-11-17 DIAGNOSIS — S93132A Subluxation of interphalangeal joint of left great toe, initial encounter: Secondary | ICD-10-CM | POA: Diagnosis not present

## 2021-12-01 DIAGNOSIS — F5101 Primary insomnia: Secondary | ICD-10-CM | POA: Diagnosis not present

## 2021-12-01 DIAGNOSIS — N390 Urinary tract infection, site not specified: Secondary | ICD-10-CM | POA: Diagnosis not present

## 2021-12-01 DIAGNOSIS — F32A Depression, unspecified: Secondary | ICD-10-CM | POA: Diagnosis not present

## 2021-12-03 DIAGNOSIS — F339 Major depressive disorder, recurrent, unspecified: Secondary | ICD-10-CM | POA: Diagnosis not present

## 2021-12-03 DIAGNOSIS — F02B18 Dementia in other diseases classified elsewhere, moderate, with other behavioral disturbance: Secondary | ICD-10-CM | POA: Diagnosis not present

## 2021-12-03 DIAGNOSIS — G301 Alzheimer's disease with late onset: Secondary | ICD-10-CM | POA: Diagnosis not present

## 2021-12-10 DIAGNOSIS — I129 Hypertensive chronic kidney disease with stage 1 through stage 4 chronic kidney disease, or unspecified chronic kidney disease: Secondary | ICD-10-CM | POA: Diagnosis not present

## 2021-12-10 DIAGNOSIS — R1312 Dysphagia, oropharyngeal phase: Secondary | ICD-10-CM | POA: Diagnosis not present

## 2021-12-10 DIAGNOSIS — G301 Alzheimer's disease with late onset: Secondary | ICD-10-CM | POA: Diagnosis not present

## 2021-12-10 DIAGNOSIS — R627 Adult failure to thrive: Secondary | ICD-10-CM | POA: Diagnosis not present

## 2021-12-10 DIAGNOSIS — M6281 Muscle weakness (generalized): Secondary | ICD-10-CM | POA: Diagnosis not present

## 2021-12-10 DIAGNOSIS — M353 Polymyalgia rheumatica: Secondary | ICD-10-CM | POA: Diagnosis not present

## 2021-12-11 DIAGNOSIS — R627 Adult failure to thrive: Secondary | ICD-10-CM | POA: Diagnosis not present

## 2021-12-11 DIAGNOSIS — M6281 Muscle weakness (generalized): Secondary | ICD-10-CM | POA: Diagnosis not present

## 2021-12-11 DIAGNOSIS — R1312 Dysphagia, oropharyngeal phase: Secondary | ICD-10-CM | POA: Diagnosis not present

## 2021-12-11 DIAGNOSIS — I129 Hypertensive chronic kidney disease with stage 1 through stage 4 chronic kidney disease, or unspecified chronic kidney disease: Secondary | ICD-10-CM | POA: Diagnosis not present

## 2021-12-11 DIAGNOSIS — M353 Polymyalgia rheumatica: Secondary | ICD-10-CM | POA: Diagnosis not present

## 2021-12-11 DIAGNOSIS — G301 Alzheimer's disease with late onset: Secondary | ICD-10-CM | POA: Diagnosis not present

## 2021-12-12 DIAGNOSIS — R1312 Dysphagia, oropharyngeal phase: Secondary | ICD-10-CM | POA: Diagnosis not present

## 2021-12-12 DIAGNOSIS — I129 Hypertensive chronic kidney disease with stage 1 through stage 4 chronic kidney disease, or unspecified chronic kidney disease: Secondary | ICD-10-CM | POA: Diagnosis not present

## 2021-12-12 DIAGNOSIS — M6281 Muscle weakness (generalized): Secondary | ICD-10-CM | POA: Diagnosis not present

## 2021-12-12 DIAGNOSIS — G301 Alzheimer's disease with late onset: Secondary | ICD-10-CM | POA: Diagnosis not present

## 2021-12-12 DIAGNOSIS — M353 Polymyalgia rheumatica: Secondary | ICD-10-CM | POA: Diagnosis not present

## 2021-12-12 DIAGNOSIS — R627 Adult failure to thrive: Secondary | ICD-10-CM | POA: Diagnosis not present

## 2021-12-13 DIAGNOSIS — M6281 Muscle weakness (generalized): Secondary | ICD-10-CM | POA: Diagnosis not present

## 2021-12-13 DIAGNOSIS — R627 Adult failure to thrive: Secondary | ICD-10-CM | POA: Diagnosis not present

## 2021-12-13 DIAGNOSIS — R1312 Dysphagia, oropharyngeal phase: Secondary | ICD-10-CM | POA: Diagnosis not present

## 2021-12-13 DIAGNOSIS — I129 Hypertensive chronic kidney disease with stage 1 through stage 4 chronic kidney disease, or unspecified chronic kidney disease: Secondary | ICD-10-CM | POA: Diagnosis not present

## 2021-12-13 DIAGNOSIS — M353 Polymyalgia rheumatica: Secondary | ICD-10-CM | POA: Diagnosis not present

## 2021-12-13 DIAGNOSIS — G301 Alzheimer's disease with late onset: Secondary | ICD-10-CM | POA: Diagnosis not present

## 2021-12-15 DIAGNOSIS — G301 Alzheimer's disease with late onset: Secondary | ICD-10-CM | POA: Diagnosis not present

## 2021-12-15 DIAGNOSIS — I129 Hypertensive chronic kidney disease with stage 1 through stage 4 chronic kidney disease, or unspecified chronic kidney disease: Secondary | ICD-10-CM | POA: Diagnosis not present

## 2021-12-15 DIAGNOSIS — M6281 Muscle weakness (generalized): Secondary | ICD-10-CM | POA: Diagnosis not present

## 2021-12-15 DIAGNOSIS — R627 Adult failure to thrive: Secondary | ICD-10-CM | POA: Diagnosis not present

## 2021-12-15 DIAGNOSIS — M353 Polymyalgia rheumatica: Secondary | ICD-10-CM | POA: Diagnosis not present

## 2021-12-15 DIAGNOSIS — R1312 Dysphagia, oropharyngeal phase: Secondary | ICD-10-CM | POA: Diagnosis not present

## 2021-12-15 DIAGNOSIS — F32A Depression, unspecified: Secondary | ICD-10-CM | POA: Diagnosis not present

## 2021-12-15 DIAGNOSIS — F5101 Primary insomnia: Secondary | ICD-10-CM | POA: Diagnosis not present

## 2021-12-16 DIAGNOSIS — R1312 Dysphagia, oropharyngeal phase: Secondary | ICD-10-CM | POA: Diagnosis not present

## 2021-12-16 DIAGNOSIS — M353 Polymyalgia rheumatica: Secondary | ICD-10-CM | POA: Diagnosis not present

## 2021-12-16 DIAGNOSIS — R627 Adult failure to thrive: Secondary | ICD-10-CM | POA: Diagnosis not present

## 2021-12-16 DIAGNOSIS — M6281 Muscle weakness (generalized): Secondary | ICD-10-CM | POA: Diagnosis not present

## 2021-12-16 DIAGNOSIS — I129 Hypertensive chronic kidney disease with stage 1 through stage 4 chronic kidney disease, or unspecified chronic kidney disease: Secondary | ICD-10-CM | POA: Diagnosis not present

## 2021-12-16 DIAGNOSIS — G301 Alzheimer's disease with late onset: Secondary | ICD-10-CM | POA: Diagnosis not present

## 2021-12-17 DIAGNOSIS — R627 Adult failure to thrive: Secondary | ICD-10-CM | POA: Diagnosis not present

## 2021-12-17 DIAGNOSIS — R1312 Dysphagia, oropharyngeal phase: Secondary | ICD-10-CM | POA: Diagnosis not present

## 2021-12-17 DIAGNOSIS — G301 Alzheimer's disease with late onset: Secondary | ICD-10-CM | POA: Diagnosis not present

## 2021-12-17 DIAGNOSIS — M6281 Muscle weakness (generalized): Secondary | ICD-10-CM | POA: Diagnosis not present

## 2021-12-17 DIAGNOSIS — M353 Polymyalgia rheumatica: Secondary | ICD-10-CM | POA: Diagnosis not present

## 2021-12-17 DIAGNOSIS — I129 Hypertensive chronic kidney disease with stage 1 through stage 4 chronic kidney disease, or unspecified chronic kidney disease: Secondary | ICD-10-CM | POA: Diagnosis not present

## 2021-12-18 DIAGNOSIS — G301 Alzheimer's disease with late onset: Secondary | ICD-10-CM | POA: Diagnosis not present

## 2021-12-18 DIAGNOSIS — R627 Adult failure to thrive: Secondary | ICD-10-CM | POA: Diagnosis not present

## 2021-12-18 DIAGNOSIS — I129 Hypertensive chronic kidney disease with stage 1 through stage 4 chronic kidney disease, or unspecified chronic kidney disease: Secondary | ICD-10-CM | POA: Diagnosis not present

## 2021-12-18 DIAGNOSIS — M6281 Muscle weakness (generalized): Secondary | ICD-10-CM | POA: Diagnosis not present

## 2021-12-18 DIAGNOSIS — M353 Polymyalgia rheumatica: Secondary | ICD-10-CM | POA: Diagnosis not present

## 2021-12-18 DIAGNOSIS — R1312 Dysphagia, oropharyngeal phase: Secondary | ICD-10-CM | POA: Diagnosis not present

## 2021-12-19 DIAGNOSIS — M6281 Muscle weakness (generalized): Secondary | ICD-10-CM | POA: Diagnosis not present

## 2021-12-19 DIAGNOSIS — I129 Hypertensive chronic kidney disease with stage 1 through stage 4 chronic kidney disease, or unspecified chronic kidney disease: Secondary | ICD-10-CM | POA: Diagnosis not present

## 2021-12-19 DIAGNOSIS — G301 Alzheimer's disease with late onset: Secondary | ICD-10-CM | POA: Diagnosis not present

## 2021-12-19 DIAGNOSIS — R627 Adult failure to thrive: Secondary | ICD-10-CM | POA: Diagnosis not present

## 2021-12-19 DIAGNOSIS — R1312 Dysphagia, oropharyngeal phase: Secondary | ICD-10-CM | POA: Diagnosis not present

## 2021-12-19 DIAGNOSIS — M353 Polymyalgia rheumatica: Secondary | ICD-10-CM | POA: Diagnosis not present

## 2021-12-22 DIAGNOSIS — R627 Adult failure to thrive: Secondary | ICD-10-CM | POA: Diagnosis not present

## 2021-12-22 DIAGNOSIS — M353 Polymyalgia rheumatica: Secondary | ICD-10-CM | POA: Diagnosis not present

## 2021-12-22 DIAGNOSIS — G301 Alzheimer's disease with late onset: Secondary | ICD-10-CM | POA: Diagnosis not present

## 2021-12-22 DIAGNOSIS — M6281 Muscle weakness (generalized): Secondary | ICD-10-CM | POA: Diagnosis not present

## 2021-12-22 DIAGNOSIS — I129 Hypertensive chronic kidney disease with stage 1 through stage 4 chronic kidney disease, or unspecified chronic kidney disease: Secondary | ICD-10-CM | POA: Diagnosis not present

## 2021-12-22 DIAGNOSIS — R1312 Dysphagia, oropharyngeal phase: Secondary | ICD-10-CM | POA: Diagnosis not present

## 2021-12-23 DIAGNOSIS — M6281 Muscle weakness (generalized): Secondary | ICD-10-CM | POA: Diagnosis not present

## 2021-12-23 DIAGNOSIS — M353 Polymyalgia rheumatica: Secondary | ICD-10-CM | POA: Diagnosis not present

## 2021-12-23 DIAGNOSIS — I129 Hypertensive chronic kidney disease with stage 1 through stage 4 chronic kidney disease, or unspecified chronic kidney disease: Secondary | ICD-10-CM | POA: Diagnosis not present

## 2021-12-23 DIAGNOSIS — R1312 Dysphagia, oropharyngeal phase: Secondary | ICD-10-CM | POA: Diagnosis not present

## 2021-12-23 DIAGNOSIS — G301 Alzheimer's disease with late onset: Secondary | ICD-10-CM | POA: Diagnosis not present

## 2021-12-23 DIAGNOSIS — R627 Adult failure to thrive: Secondary | ICD-10-CM | POA: Diagnosis not present

## 2021-12-24 DIAGNOSIS — R627 Adult failure to thrive: Secondary | ICD-10-CM | POA: Diagnosis not present

## 2021-12-24 DIAGNOSIS — M6281 Muscle weakness (generalized): Secondary | ICD-10-CM | POA: Diagnosis not present

## 2021-12-24 DIAGNOSIS — R1312 Dysphagia, oropharyngeal phase: Secondary | ICD-10-CM | POA: Diagnosis not present

## 2021-12-24 DIAGNOSIS — I129 Hypertensive chronic kidney disease with stage 1 through stage 4 chronic kidney disease, or unspecified chronic kidney disease: Secondary | ICD-10-CM | POA: Diagnosis not present

## 2021-12-24 DIAGNOSIS — M353 Polymyalgia rheumatica: Secondary | ICD-10-CM | POA: Diagnosis not present

## 2021-12-24 DIAGNOSIS — G301 Alzheimer's disease with late onset: Secondary | ICD-10-CM | POA: Diagnosis not present

## 2021-12-25 DIAGNOSIS — M6281 Muscle weakness (generalized): Secondary | ICD-10-CM | POA: Diagnosis not present

## 2021-12-25 DIAGNOSIS — G301 Alzheimer's disease with late onset: Secondary | ICD-10-CM | POA: Diagnosis not present

## 2021-12-25 DIAGNOSIS — M353 Polymyalgia rheumatica: Secondary | ICD-10-CM | POA: Diagnosis not present

## 2021-12-25 DIAGNOSIS — R627 Adult failure to thrive: Secondary | ICD-10-CM | POA: Diagnosis not present

## 2021-12-25 DIAGNOSIS — I129 Hypertensive chronic kidney disease with stage 1 through stage 4 chronic kidney disease, or unspecified chronic kidney disease: Secondary | ICD-10-CM | POA: Diagnosis not present

## 2021-12-25 DIAGNOSIS — R1312 Dysphagia, oropharyngeal phase: Secondary | ICD-10-CM | POA: Diagnosis not present

## 2021-12-28 DIAGNOSIS — I129 Hypertensive chronic kidney disease with stage 1 through stage 4 chronic kidney disease, or unspecified chronic kidney disease: Secondary | ICD-10-CM | POA: Diagnosis not present

## 2021-12-28 DIAGNOSIS — M6281 Muscle weakness (generalized): Secondary | ICD-10-CM | POA: Diagnosis not present

## 2021-12-28 DIAGNOSIS — M353 Polymyalgia rheumatica: Secondary | ICD-10-CM | POA: Diagnosis not present

## 2021-12-28 DIAGNOSIS — G301 Alzheimer's disease with late onset: Secondary | ICD-10-CM | POA: Diagnosis not present

## 2021-12-28 DIAGNOSIS — R627 Adult failure to thrive: Secondary | ICD-10-CM | POA: Diagnosis not present

## 2021-12-28 DIAGNOSIS — R1312 Dysphagia, oropharyngeal phase: Secondary | ICD-10-CM | POA: Diagnosis not present

## 2021-12-29 DIAGNOSIS — F32A Depression, unspecified: Secondary | ICD-10-CM | POA: Diagnosis not present

## 2021-12-29 DIAGNOSIS — F5101 Primary insomnia: Secondary | ICD-10-CM | POA: Diagnosis not present

## 2021-12-31 DIAGNOSIS — R1312 Dysphagia, oropharyngeal phase: Secondary | ICD-10-CM | POA: Diagnosis not present

## 2021-12-31 DIAGNOSIS — R627 Adult failure to thrive: Secondary | ICD-10-CM | POA: Diagnosis not present

## 2021-12-31 DIAGNOSIS — M6281 Muscle weakness (generalized): Secondary | ICD-10-CM | POA: Diagnosis not present

## 2021-12-31 DIAGNOSIS — M353 Polymyalgia rheumatica: Secondary | ICD-10-CM | POA: Diagnosis not present

## 2021-12-31 DIAGNOSIS — I129 Hypertensive chronic kidney disease with stage 1 through stage 4 chronic kidney disease, or unspecified chronic kidney disease: Secondary | ICD-10-CM | POA: Diagnosis not present

## 2021-12-31 DIAGNOSIS — G301 Alzheimer's disease with late onset: Secondary | ICD-10-CM | POA: Diagnosis not present

## 2022-01-04 DIAGNOSIS — G301 Alzheimer's disease with late onset: Secondary | ICD-10-CM | POA: Diagnosis not present

## 2022-01-04 DIAGNOSIS — F5101 Primary insomnia: Secondary | ICD-10-CM | POA: Diagnosis not present

## 2022-01-04 DIAGNOSIS — F339 Major depressive disorder, recurrent, unspecified: Secondary | ICD-10-CM | POA: Diagnosis not present

## 2022-01-04 DIAGNOSIS — F02B18 Dementia in other diseases classified elsewhere, moderate, with other behavioral disturbance: Secondary | ICD-10-CM | POA: Diagnosis not present

## 2022-01-12 DIAGNOSIS — F5101 Primary insomnia: Secondary | ICD-10-CM | POA: Diagnosis not present

## 2022-01-12 DIAGNOSIS — F32A Depression, unspecified: Secondary | ICD-10-CM | POA: Diagnosis not present

## 2022-01-17 DIAGNOSIS — M353 Polymyalgia rheumatica: Secondary | ICD-10-CM | POA: Diagnosis not present

## 2022-01-17 DIAGNOSIS — R1312 Dysphagia, oropharyngeal phase: Secondary | ICD-10-CM | POA: Diagnosis not present

## 2022-01-17 DIAGNOSIS — R627 Adult failure to thrive: Secondary | ICD-10-CM | POA: Diagnosis not present

## 2022-01-17 DIAGNOSIS — G301 Alzheimer's disease with late onset: Secondary | ICD-10-CM | POA: Diagnosis not present

## 2022-01-17 DIAGNOSIS — M6281 Muscle weakness (generalized): Secondary | ICD-10-CM | POA: Diagnosis not present

## 2022-01-17 DIAGNOSIS — I129 Hypertensive chronic kidney disease with stage 1 through stage 4 chronic kidney disease, or unspecified chronic kidney disease: Secondary | ICD-10-CM | POA: Diagnosis not present

## 2022-01-18 DIAGNOSIS — R627 Adult failure to thrive: Secondary | ICD-10-CM | POA: Diagnosis not present

## 2022-01-18 DIAGNOSIS — R1312 Dysphagia, oropharyngeal phase: Secondary | ICD-10-CM | POA: Diagnosis not present

## 2022-01-18 DIAGNOSIS — G301 Alzheimer's disease with late onset: Secondary | ICD-10-CM | POA: Diagnosis not present

## 2022-01-18 DIAGNOSIS — M6281 Muscle weakness (generalized): Secondary | ICD-10-CM | POA: Diagnosis not present

## 2022-01-18 DIAGNOSIS — I129 Hypertensive chronic kidney disease with stage 1 through stage 4 chronic kidney disease, or unspecified chronic kidney disease: Secondary | ICD-10-CM | POA: Diagnosis not present

## 2022-01-18 DIAGNOSIS — M353 Polymyalgia rheumatica: Secondary | ICD-10-CM | POA: Diagnosis not present

## 2022-01-19 DIAGNOSIS — M6281 Muscle weakness (generalized): Secondary | ICD-10-CM | POA: Diagnosis not present

## 2022-01-19 DIAGNOSIS — G301 Alzheimer's disease with late onset: Secondary | ICD-10-CM | POA: Diagnosis not present

## 2022-01-19 DIAGNOSIS — M353 Polymyalgia rheumatica: Secondary | ICD-10-CM | POA: Diagnosis not present

## 2022-01-19 DIAGNOSIS — I129 Hypertensive chronic kidney disease with stage 1 through stage 4 chronic kidney disease, or unspecified chronic kidney disease: Secondary | ICD-10-CM | POA: Diagnosis not present

## 2022-01-19 DIAGNOSIS — R627 Adult failure to thrive: Secondary | ICD-10-CM | POA: Diagnosis not present

## 2022-01-19 DIAGNOSIS — R1312 Dysphagia, oropharyngeal phase: Secondary | ICD-10-CM | POA: Diagnosis not present

## 2022-01-20 DIAGNOSIS — M353 Polymyalgia rheumatica: Secondary | ICD-10-CM | POA: Diagnosis not present

## 2022-01-20 DIAGNOSIS — M6281 Muscle weakness (generalized): Secondary | ICD-10-CM | POA: Diagnosis not present

## 2022-01-20 DIAGNOSIS — G301 Alzheimer's disease with late onset: Secondary | ICD-10-CM | POA: Diagnosis not present

## 2022-01-20 DIAGNOSIS — R627 Adult failure to thrive: Secondary | ICD-10-CM | POA: Diagnosis not present

## 2022-01-20 DIAGNOSIS — R1312 Dysphagia, oropharyngeal phase: Secondary | ICD-10-CM | POA: Diagnosis not present

## 2022-01-20 DIAGNOSIS — I129 Hypertensive chronic kidney disease with stage 1 through stage 4 chronic kidney disease, or unspecified chronic kidney disease: Secondary | ICD-10-CM | POA: Diagnosis not present

## 2022-01-21 DIAGNOSIS — G301 Alzheimer's disease with late onset: Secondary | ICD-10-CM | POA: Diagnosis not present

## 2022-01-21 DIAGNOSIS — R627 Adult failure to thrive: Secondary | ICD-10-CM | POA: Diagnosis not present

## 2022-01-21 DIAGNOSIS — M353 Polymyalgia rheumatica: Secondary | ICD-10-CM | POA: Diagnosis not present

## 2022-01-21 DIAGNOSIS — M6281 Muscle weakness (generalized): Secondary | ICD-10-CM | POA: Diagnosis not present

## 2022-01-21 DIAGNOSIS — I129 Hypertensive chronic kidney disease with stage 1 through stage 4 chronic kidney disease, or unspecified chronic kidney disease: Secondary | ICD-10-CM | POA: Diagnosis not present

## 2022-01-21 DIAGNOSIS — R1312 Dysphagia, oropharyngeal phase: Secondary | ICD-10-CM | POA: Diagnosis not present

## 2022-01-24 DIAGNOSIS — R627 Adult failure to thrive: Secondary | ICD-10-CM | POA: Diagnosis not present

## 2022-01-24 DIAGNOSIS — I129 Hypertensive chronic kidney disease with stage 1 through stage 4 chronic kidney disease, or unspecified chronic kidney disease: Secondary | ICD-10-CM | POA: Diagnosis not present

## 2022-01-24 DIAGNOSIS — M6281 Muscle weakness (generalized): Secondary | ICD-10-CM | POA: Diagnosis not present

## 2022-01-24 DIAGNOSIS — M353 Polymyalgia rheumatica: Secondary | ICD-10-CM | POA: Diagnosis not present

## 2022-01-24 DIAGNOSIS — R1312 Dysphagia, oropharyngeal phase: Secondary | ICD-10-CM | POA: Diagnosis not present

## 2022-01-24 DIAGNOSIS — G301 Alzheimer's disease with late onset: Secondary | ICD-10-CM | POA: Diagnosis not present

## 2022-01-25 DIAGNOSIS — M6281 Muscle weakness (generalized): Secondary | ICD-10-CM | POA: Diagnosis not present

## 2022-01-25 DIAGNOSIS — I129 Hypertensive chronic kidney disease with stage 1 through stage 4 chronic kidney disease, or unspecified chronic kidney disease: Secondary | ICD-10-CM | POA: Diagnosis not present

## 2022-01-25 DIAGNOSIS — R627 Adult failure to thrive: Secondary | ICD-10-CM | POA: Diagnosis not present

## 2022-01-25 DIAGNOSIS — R1312 Dysphagia, oropharyngeal phase: Secondary | ICD-10-CM | POA: Diagnosis not present

## 2022-01-25 DIAGNOSIS — M353 Polymyalgia rheumatica: Secondary | ICD-10-CM | POA: Diagnosis not present

## 2022-01-25 DIAGNOSIS — G301 Alzheimer's disease with late onset: Secondary | ICD-10-CM | POA: Diagnosis not present

## 2022-01-26 DIAGNOSIS — M353 Polymyalgia rheumatica: Secondary | ICD-10-CM | POA: Diagnosis not present

## 2022-01-26 DIAGNOSIS — R627 Adult failure to thrive: Secondary | ICD-10-CM | POA: Diagnosis not present

## 2022-01-26 DIAGNOSIS — I129 Hypertensive chronic kidney disease with stage 1 through stage 4 chronic kidney disease, or unspecified chronic kidney disease: Secondary | ICD-10-CM | POA: Diagnosis not present

## 2022-01-26 DIAGNOSIS — R1312 Dysphagia, oropharyngeal phase: Secondary | ICD-10-CM | POA: Diagnosis not present

## 2022-01-26 DIAGNOSIS — M6281 Muscle weakness (generalized): Secondary | ICD-10-CM | POA: Diagnosis not present

## 2022-01-26 DIAGNOSIS — G301 Alzheimer's disease with late onset: Secondary | ICD-10-CM | POA: Diagnosis not present

## 2022-01-27 DIAGNOSIS — I129 Hypertensive chronic kidney disease with stage 1 through stage 4 chronic kidney disease, or unspecified chronic kidney disease: Secondary | ICD-10-CM | POA: Diagnosis not present

## 2022-01-27 DIAGNOSIS — R627 Adult failure to thrive: Secondary | ICD-10-CM | POA: Diagnosis not present

## 2022-01-27 DIAGNOSIS — M6281 Muscle weakness (generalized): Secondary | ICD-10-CM | POA: Diagnosis not present

## 2022-01-27 DIAGNOSIS — R1312 Dysphagia, oropharyngeal phase: Secondary | ICD-10-CM | POA: Diagnosis not present

## 2022-01-27 DIAGNOSIS — G301 Alzheimer's disease with late onset: Secondary | ICD-10-CM | POA: Diagnosis not present

## 2022-01-27 DIAGNOSIS — M353 Polymyalgia rheumatica: Secondary | ICD-10-CM | POA: Diagnosis not present

## 2022-01-28 DIAGNOSIS — G301 Alzheimer's disease with late onset: Secondary | ICD-10-CM | POA: Diagnosis not present

## 2022-01-28 DIAGNOSIS — R1312 Dysphagia, oropharyngeal phase: Secondary | ICD-10-CM | POA: Diagnosis not present

## 2022-01-28 DIAGNOSIS — R627 Adult failure to thrive: Secondary | ICD-10-CM | POA: Diagnosis not present

## 2022-01-28 DIAGNOSIS — F02B18 Dementia in other diseases classified elsewhere, moderate, with other behavioral disturbance: Secondary | ICD-10-CM | POA: Diagnosis not present

## 2022-01-28 DIAGNOSIS — M6281 Muscle weakness (generalized): Secondary | ICD-10-CM | POA: Diagnosis not present

## 2022-01-28 DIAGNOSIS — I129 Hypertensive chronic kidney disease with stage 1 through stage 4 chronic kidney disease, or unspecified chronic kidney disease: Secondary | ICD-10-CM | POA: Diagnosis not present

## 2022-01-28 DIAGNOSIS — F5101 Primary insomnia: Secondary | ICD-10-CM | POA: Diagnosis not present

## 2022-01-28 DIAGNOSIS — M353 Polymyalgia rheumatica: Secondary | ICD-10-CM | POA: Diagnosis not present

## 2022-01-28 DIAGNOSIS — F339 Major depressive disorder, recurrent, unspecified: Secondary | ICD-10-CM | POA: Diagnosis not present

## 2022-01-31 ENCOUNTER — Observation Stay (HOSPITAL_COMMUNITY): Payer: Medicare Other

## 2022-01-31 ENCOUNTER — Encounter (HOSPITAL_COMMUNITY): Payer: Self-pay | Admitting: Emergency Medicine

## 2022-01-31 ENCOUNTER — Emergency Department (HOSPITAL_COMMUNITY): Payer: Medicare Other

## 2022-01-31 ENCOUNTER — Observation Stay (HOSPITAL_COMMUNITY)
Admission: EM | Admit: 2022-01-31 | Discharge: 2022-02-02 | Disposition: A | Discharge status: Skilled Nursing Facility | Payer: Medicare Other | Attending: Internal Medicine | Admitting: Internal Medicine

## 2022-01-31 DIAGNOSIS — I129 Hypertensive chronic kidney disease with stage 1 through stage 4 chronic kidney disease, or unspecified chronic kidney disease: Secondary | ICD-10-CM | POA: Diagnosis not present

## 2022-01-31 DIAGNOSIS — M6281 Muscle weakness (generalized): Secondary | ICD-10-CM | POA: Insufficient documentation

## 2022-01-31 DIAGNOSIS — M353 Polymyalgia rheumatica: Secondary | ICD-10-CM | POA: Diagnosis present

## 2022-01-31 DIAGNOSIS — N3000 Acute cystitis without hematuria: Secondary | ICD-10-CM | POA: Diagnosis not present

## 2022-01-31 DIAGNOSIS — F0390 Unspecified dementia without behavioral disturbance: Secondary | ICD-10-CM | POA: Diagnosis present

## 2022-01-31 DIAGNOSIS — Z86711 Personal history of pulmonary embolism: Secondary | ICD-10-CM | POA: Insufficient documentation

## 2022-01-31 DIAGNOSIS — Z85828 Personal history of other malignant neoplasm of skin: Secondary | ICD-10-CM | POA: Insufficient documentation

## 2022-01-31 DIAGNOSIS — N183 Chronic kidney disease, stage 3 unspecified: Secondary | ICD-10-CM | POA: Diagnosis not present

## 2022-01-31 DIAGNOSIS — I25110 Atherosclerotic heart disease of native coronary artery with unstable angina pectoris: Secondary | ICD-10-CM | POA: Diagnosis not present

## 2022-01-31 DIAGNOSIS — M7989 Other specified soft tissue disorders: Secondary | ICD-10-CM | POA: Diagnosis not present

## 2022-01-31 DIAGNOSIS — I10 Essential (primary) hypertension: Secondary | ICD-10-CM | POA: Diagnosis present

## 2022-01-31 DIAGNOSIS — Z7901 Long term (current) use of anticoagulants: Secondary | ICD-10-CM | POA: Insufficient documentation

## 2022-01-31 DIAGNOSIS — I8290 Acute embolism and thrombosis of unspecified vein: Secondary | ICD-10-CM | POA: Diagnosis present

## 2022-01-31 DIAGNOSIS — L97529 Non-pressure chronic ulcer of other part of left foot with unspecified severity: Secondary | ICD-10-CM | POA: Diagnosis not present

## 2022-01-31 DIAGNOSIS — M85872 Other specified disorders of bone density and structure, left ankle and foot: Secondary | ICD-10-CM | POA: Diagnosis not present

## 2022-01-31 DIAGNOSIS — G309 Alzheimer's disease, unspecified: Secondary | ICD-10-CM | POA: Diagnosis not present

## 2022-01-31 DIAGNOSIS — Z20822 Contact with and (suspected) exposure to covid-19: Secondary | ICD-10-CM | POA: Insufficient documentation

## 2022-01-31 DIAGNOSIS — R1312 Dysphagia, oropharyngeal phase: Secondary | ICD-10-CM | POA: Diagnosis not present

## 2022-01-31 DIAGNOSIS — J9610 Chronic respiratory failure, unspecified whether with hypoxia or hypercapnia: Secondary | ICD-10-CM | POA: Diagnosis not present

## 2022-01-31 DIAGNOSIS — J449 Chronic obstructive pulmonary disease, unspecified: Secondary | ICD-10-CM | POA: Diagnosis not present

## 2022-01-31 DIAGNOSIS — R000 Tachycardia, unspecified: Secondary | ICD-10-CM | POA: Diagnosis not present

## 2022-01-31 DIAGNOSIS — D649 Anemia, unspecified: Secondary | ICD-10-CM

## 2022-01-31 DIAGNOSIS — R4781 Slurred speech: Secondary | ICD-10-CM | POA: Diagnosis not present

## 2022-01-31 DIAGNOSIS — Z8673 Personal history of transient ischemic attack (TIA), and cerebral infarction without residual deficits: Secondary | ICD-10-CM | POA: Diagnosis not present

## 2022-01-31 DIAGNOSIS — I2510 Atherosclerotic heart disease of native coronary artery without angina pectoris: Secondary | ICD-10-CM | POA: Diagnosis present

## 2022-01-31 DIAGNOSIS — F0280 Dementia in other diseases classified elsewhere without behavioral disturbance: Secondary | ICD-10-CM | POA: Insufficient documentation

## 2022-01-31 DIAGNOSIS — E440 Moderate protein-calorie malnutrition: Secondary | ICD-10-CM | POA: Diagnosis present

## 2022-01-31 DIAGNOSIS — R4182 Altered mental status, unspecified: Secondary | ICD-10-CM | POA: Diagnosis not present

## 2022-01-31 DIAGNOSIS — I639 Cerebral infarction, unspecified: Secondary | ICD-10-CM

## 2022-01-31 DIAGNOSIS — N390 Urinary tract infection, site not specified: Secondary | ICD-10-CM | POA: Diagnosis present

## 2022-01-31 DIAGNOSIS — F419 Anxiety disorder, unspecified: Secondary | ICD-10-CM | POA: Diagnosis present

## 2022-01-31 DIAGNOSIS — G9340 Encephalopathy, unspecified: Principal | ICD-10-CM | POA: Diagnosis present

## 2022-01-31 DIAGNOSIS — R404 Transient alteration of awareness: Secondary | ICD-10-CM | POA: Diagnosis not present

## 2022-01-31 DIAGNOSIS — Z79899 Other long term (current) drug therapy: Secondary | ICD-10-CM | POA: Diagnosis not present

## 2022-01-31 DIAGNOSIS — M2012 Hallux valgus (acquired), left foot: Secondary | ICD-10-CM | POA: Diagnosis not present

## 2022-01-31 LAB — URINALYSIS, ROUTINE W REFLEX MICROSCOPIC
Bilirubin Urine: NEGATIVE
Glucose, UA: NEGATIVE mg/dL
Ketones, ur: NEGATIVE mg/dL
Nitrite: NEGATIVE
Protein, ur: 100 mg/dL — AB
RBC / HPF: >50 RBC/hpf — ABNORMAL HIGH (ref 0–5)
Specific Gravity, Urine: 1.013 (ref 1.005–1.030)
WBC, UA: >50 WBC/hpf — ABNORMAL HIGH (ref 0–5)
pH: 7 (ref 5.0–8.0)

## 2022-01-31 LAB — COMPREHENSIVE METABOLIC PANEL
ALT: 11 U/L (ref 0–44)
AST: 12 U/L — ABNORMAL LOW (ref 15–41)
Albumin: 2.1 g/dL — ABNORMAL LOW (ref 3.5–5.0)
Alkaline Phosphatase: 59 U/L (ref 38–126)
Anion gap: 6 (ref 5–15)
BUN: 24 mg/dL — ABNORMAL HIGH (ref 8–23)
CO2: 20 mmol/L — ABNORMAL LOW (ref 22–32)
Calcium: 8.4 mg/dL — ABNORMAL LOW (ref 8.9–10.3)
Chloride: 110 mmol/L (ref 98–111)
Creatinine, Ser: 1.19 mg/dL — ABNORMAL HIGH (ref 0.44–1.00)
GFR, Estimated: 41 mL/min — ABNORMAL LOW (ref >60)
Glucose, Bld: 96 mg/dL (ref 70–99)
Potassium: 3.8 mmol/L (ref 3.5–5.1)
Sodium: 136 mmol/L (ref 135–145)
Total Bilirubin: 0.4 mg/dL (ref 0.3–1.2)
Total Protein: 5.5 g/dL — ABNORMAL LOW (ref 6.5–8.1)

## 2022-01-31 LAB — PROTIME-INR
INR: 1.9 — ABNORMAL HIGH (ref 0.8–1.2)
Prothrombin Time: 21.7 seconds — ABNORMAL HIGH (ref 11.4–15.2)

## 2022-01-31 LAB — CBC WITH DIFFERENTIAL/PLATELET
Abs Immature Granulocytes: 0.07 10*3/uL (ref 0.00–0.07)
Basophils Absolute: 0.1 10*3/uL (ref 0.0–0.1)
Basophils Relative: 1 %
Eosinophils Absolute: 0.3 10*3/uL (ref 0.0–0.5)
Eosinophils Relative: 3 %
HCT: 33.2 % — ABNORMAL LOW (ref 36.0–46.0)
Hemoglobin: 10.4 g/dL — ABNORMAL LOW (ref 12.0–15.0)
Immature Granulocytes: 1 %
Lymphocytes Relative: 25 %
Lymphs Abs: 2.6 10*3/uL (ref 0.7–4.0)
MCH: 26.8 pg (ref 26.0–34.0)
MCHC: 31.3 g/dL (ref 30.0–36.0)
MCV: 85.6 fL (ref 80.0–100.0)
Monocytes Absolute: 0.5 10*3/uL (ref 0.1–1.0)
Monocytes Relative: 5 %
Neutro Abs: 6.6 10*3/uL (ref 1.7–7.7)
Neutrophils Relative %: 65 %
Platelets: 366 10*3/uL (ref 150–400)
RBC: 3.88 MIL/uL (ref 3.87–5.11)
RDW: 17 % — ABNORMAL HIGH (ref 11.5–15.5)
WBC: 10.2 10*3/uL (ref 4.0–10.5)
nRBC: 0 % (ref 0.0–0.2)

## 2022-01-31 LAB — C-REACTIVE PROTEIN: CRP: 4.2 mg/dL — ABNORMAL HIGH (ref <1.0)

## 2022-01-31 LAB — VITAMIN B12: Vitamin B-12: 442 pg/mL (ref 180–914)

## 2022-01-31 LAB — RESP PANEL BY RT-PCR (FLU A&B, COVID) ARPGX2
Influenza A by PCR: NEGATIVE
Influenza B by PCR: NEGATIVE
SARS Coronavirus 2 by RT PCR: NEGATIVE

## 2022-01-31 LAB — MRSA NEXT GEN BY PCR, NASAL: MRSA by PCR Next Gen: NOT DETECTED

## 2022-01-31 LAB — TROPONIN I (HIGH SENSITIVITY)
Troponin I (High Sensitivity): 23 ng/L — ABNORMAL HIGH (ref <18)
Troponin I (High Sensitivity): 23 ng/L — ABNORMAL HIGH (ref <18)

## 2022-01-31 LAB — SEDIMENTATION RATE: Sed Rate: 71 mm/hr — ABNORMAL HIGH (ref 0–22)

## 2022-01-31 LAB — TSH: TSH: 0.867 u[IU]/mL (ref 0.350–4.500)

## 2022-01-31 LAB — CBG MONITORING, ED: Glucose-Capillary: 96 mg/dL (ref 70–99)

## 2022-01-31 LAB — LACTIC ACID, PLASMA: Lactic Acid, Venous: 1.3 mmol/L (ref 0.5–1.9)

## 2022-01-31 LAB — AMMONIA: Ammonia: <10 umol/L (ref 9–35)

## 2022-01-31 MED ORDER — DONEPEZIL HCL 10 MG PO TABS
10.0000 mg | ORAL_TABLET | Freq: Every day | ORAL | Status: DC
  Administered 2022-01-31 – 2022-02-01 (×2): 10 mg via ORAL
  Filled 2022-01-31 (×3): qty 1

## 2022-01-31 MED ORDER — PREDNISONE 1 MG PO TABS
1.0000 mg | ORAL_TABLET | Freq: Every day | ORAL | Status: DC
  Administered 2022-02-01 – 2022-02-02 (×2): 1 mg via ORAL
  Filled 2022-01-31 (×2): qty 1

## 2022-01-31 MED ORDER — POLYETHYLENE GLYCOL 3350 17 G PO PACK
17.0000 g | PACK | Freq: Every day | ORAL | Status: DC
  Filled 2022-01-31: qty 1

## 2022-01-31 MED ORDER — ROPINIROLE HCL 1 MG PO TABS: 2.0000 mg | ORAL_TABLET | Freq: Every day | ORAL | Status: DC

## 2022-01-31 MED ORDER — SODIUM CHLORIDE 0.9 % IV SOLN
1.0000 g | INTRAVENOUS | Status: DC
  Administered 2022-02-01: 1 g via INTRAVENOUS
  Filled 2022-01-31: qty 10

## 2022-01-31 MED ORDER — SODIUM CHLORIDE 0.9 % IV SOLN: INTRAVENOUS | Status: DC

## 2022-01-31 MED ORDER — MORPHINE SULFATE (PF) 2 MG/ML IV SOLN
0.5000 mg | INTRAVENOUS | Status: DC | PRN
  Administered 2022-01-31: 0.5 mg via INTRAVENOUS
  Filled 2022-01-31: qty 1

## 2022-01-31 MED ORDER — ACETAMINOPHEN 325 MG PO TABS
650.0000 mg | ORAL_TABLET | Freq: Four times a day (QID) | ORAL | Status: DC | PRN
  Administered 2022-01-31 – 2022-02-01 (×2): 650 mg via ORAL
  Filled 2022-01-31 (×2): qty 2

## 2022-01-31 MED ORDER — ROPINIROLE HCL 1 MG PO TABS
1.0000 mg | ORAL_TABLET | Freq: Two times a day (BID) | ORAL | Status: DC
  Administered 2022-01-31 – 2022-02-02 (×4): 1 mg via ORAL
  Filled 2022-01-31 (×4): qty 1

## 2022-01-31 MED ORDER — APIXABAN 2.5 MG PO TABS
2.5000 mg | ORAL_TABLET | Freq: Two times a day (BID) | ORAL | Status: DC
  Administered 2022-01-31 – 2022-02-02 (×4): 2.5 mg via ORAL
  Filled 2022-01-31 (×4): qty 1

## 2022-01-31 MED ORDER — FERROUS SULFATE 325 (65 FE) MG PO TABS
324.0000 mg | ORAL_TABLET | Freq: Every day | ORAL | Status: DC
  Administered 2022-02-01 – 2022-02-02 (×2): 324 mg via ORAL
  Filled 2022-01-31 (×2): qty 1

## 2022-01-31 MED ORDER — NITROGLYCERIN 0.4 MG SL SUBL: 0.4000 mg | SUBLINGUAL_TABLET | SUBLINGUAL | Status: DC | PRN

## 2022-01-31 MED ORDER — SODIUM CHLORIDE 0.9 % IV SOLN
1.0000 g | Freq: Once | INTRAVENOUS | Status: AC
  Administered 2022-01-31: 1 g via INTRAVENOUS
  Filled 2022-01-31: qty 10

## 2022-01-31 MED ORDER — SENNOSIDES-DOCUSATE SODIUM 8.6-50 MG PO TABS
1.0000 | ORAL_TABLET | Freq: Two times a day (BID) | ORAL | Status: DC
  Administered 2022-02-01 – 2022-02-02 (×3): 1 via ORAL
  Filled 2022-01-31 (×4): qty 1

## 2022-01-31 MED ORDER — SODIUM CHLORIDE 0.9 % IV BOLUS
1000.0000 mL | Freq: Once | INTRAVENOUS | Status: AC
  Administered 2022-01-31: 1000 mL via INTRAVENOUS

## 2022-01-31 MED ORDER — SODIUM CHLORIDE 0.9 % IV SOLN: INTRAVENOUS | Status: AC

## 2022-01-31 MED ORDER — ROPINIROLE HCL 1 MG PO TABS
2.0000 mg | ORAL_TABLET | Freq: Every day | ORAL | Status: DC
  Administered 2022-02-01: 2 mg via ORAL
  Filled 2022-01-31: qty 2

## 2022-01-31 NOTE — Assessment & Plan Note (Addendum)
Has had an ulcer on plantar aspect of great left toe for > year per son.  ?Raw, foul smell, no drainage ?Will xray, check inflammatory markers although on daily prednisone  ?Wound care and nutrition  ?

## 2022-01-31 NOTE — H&P (Signed)
?History and Physical  ? ? ?Patient: [name] [name] HBZ:[id number] DOB: 03/30/1921 ?DOA: 01/31/2022 ?DOS: the patient was seen and examined on 01/31/2022 ?PCP: Elmore Guise, MD  ?Patient coming from: SNF  ? ? ?Chief Complaint: AMS ? ?HPI: [name] [name] is a 88 y.o. female with medical history significant of HTN, CAD s/p PCI, hx of PE/DVT on eliquis, alzheimer's disease, CKD stage III, depression and anxiety, dysphagia, PMR, chronic respiratory failure on 2L oxygen,  and FTT who presented to ED with complaints of AMS. Son is at bedside and gives history. He last talked to her yesterday around 12:30-1300 and she was her normal self. He states he took care of her for 20+ years and every time she would get a UTI she would get confused like today, but this is the worst he has seen her. He states she has not been ill recently and has not complained of anything off. She has been losing weight over the past few months (12-13 pounds due to dementia).  ? ?Baseline: converses and alert. Needs min assist for eating. Wc/bedbound  ? ? ? ?ER Course:  vitals: temp: 97.4, bp: 112/58, HR: 78, RR: 16, oxygen: 98% 2LNC ?Pertinent labs: hgb: 10.4 (8-9), creatinine: 1.19, BUN: 24, albumin 2.1, INR 1.9, UA-suspicious for infection ?CXR: no acute findings ?CT head: right thalamic infarct which has subacute to remote appearance, but new since 2021 ?In ED: 1L bolus, dose of rocephin.  ? ? ? ?Review of Systems: Unable to review all systems due to inability of patient to answer questions.  ?Past Medical History:  ?Diagnosis Date  ? Anemia 03/02/2012  ? Angina pectoris, unstable ([place]) 03/02/2012  ? Anxiety   ? Axillary abscess   ? Bradycardia 06/13/2012  ? CAD (coronary artery disease)   ? S/p PTCA / stenting (last cath 2004, multiple LAD stents, 2 stents in the right coronary artery all patent)  ? Cancer [facility])   ? Complication of anesthesia   ? Compression fracture of L2 ([place]) 03/12/2017  ? Dementia ([place])   ? Dysphagia 10/06/2012  ? Dyspnea  02/03/2012  ? GERD (gastroesophageal reflux disease)   ? History of pulmonary embolus (PE) 03/19/2017  ? HTN (hypertension)   ? PMR (polymyalgia rheumatica) (HCC)   ? PONV (postoperative nausea and vomiting)   ? Restless leg syndrome   ? VTE (venous thromboembolism) 10/2010  ? DVT and PE. Started coumadin  ? Weakness of both legs 06/09/2012  ? ?Past Surgical History:  ?Procedure Laterality Date  ? BREAST LUMPECTOMY    ? COLECTOMY    ? ESOPHAGOGASTRODUODENOSCOPY (EGD) WITH ESOPHAGEAL DILATION  10/10/2012  ? Procedure: ESOPHAGOGASTRODUODENOSCOPY (EGD) WITH ESOPHAGEAL DILATION;  Surgeon: Inda Castle, MD;  Location: [facility];  Service: Endoscopy;  Laterality: N/A;  ? FEMUR IM NAIL Left 12/13/2018  ? Procedure: INTRAMEDULLARY (IM) NAIL FEMORAL LEFT;  Surgeon: Paralee Cancel, MD;  [facility];  Service: Orthopedics;  Laterality: Left;  ? heart stents  x 8  ? INCISION AND DRAINAGE    ? bilateral axillary, non specific staff  ? INCISION AND DRAINAGE ABSCESS  09/28/2012  ? Procedure: INCISION AND DRAINAGE ABSCESS;  Surgeon: Zenovia Jarred, MD;  Location: [facility];  Service: General;  Laterality: Bilateral;  ? stent    ? cardiac x 8 stents.  ? ?Social History:  reports that she has never smoked. She has never used smokeless tobacco. She reports that she does not drink alcohol and does not use drugs. ? ?Allergies  ?  Allergen Reactions  ? Ambien [Zolpidem Tartrate] Nausea And Vomiting  ? Codeine   ? Codeine Phosphate Nausea And Vomiting  ? Darvocet [Propoxyphene N-Acetaminophen] Nausea And Vomiting  ? Fish Allergy   ? Fish-Derived Products   ? Hydrocodone   ? Other   ?  Chocolate Flavor.   ? Promethazine   ? Promethazine Hcl Nausea And Vomiting  ? Sulfamethoxazole Other (See Comments)  ?  Pt doesn't remember reaction  ? Vicodin [Hydrocodone-Acetaminophen] Other (See Comments)  ?  Unknown reaction  ? Amoxicillin Nausea And Vomiting and Rash  ? Penicillins Nausea And Vomiting and Rash  ? ? ?Family History  ?Problem Relation  Age of Onset  ? Heart failure Mother   ? Stroke Father   ? Breast cancer Sister   ? Heart disease Sister   ? Heart disease Brother   ? ? ?Prior to Admission medications   ?Medication Sig Start Date End Date Taking? Authorizing Provider  ?acetaminophen (TYLENOL) 325 MG tablet Take 650 mg by mouth every 6 (six) hours as needed for mild pain, moderate pain or fever.    [provider]  ?albuterol (VENTOLIN HFA) 108 (90 Base) MCG/ACT inhaler Inhale 2 puffs into the lungs every 6 (six) hours as needed for wheezing or shortness of breath.    [provider]  ?ALPRAZolam Duanne Moron) 0.25 MG tablet Take 0.25 mg by mouth daily in the afternoon.    [provider]  ?amLODipine (NORVASC) 10 MG tablet Take 10 mg by mouth every morning.     [provider]  ?calcium carbonate (TUMS - DOSED IN MG ELEMENTAL CALCIUM) 500 MG chewable tablet Chew 1 tablet by mouth 3 (three) times daily as needed for indigestion or heartburn.    [provider]  ?Cholecalciferol (VITAMIN D3) 1.25 MG (50000 UT) CAPS Take 1 capsule by mouth once a week.    [provider]  ?donepezil (ARICEPT) 10 MG tablet Take 10 mg by mouth at bedtime.     [provider]  ?Doxepin HCl 3 MG TABS Take 3 mg by mouth at bedtime. For Sleep 05/01/20   [provider]  ?losartan (COZAAR) 50 MG tablet Take 50 mg by mouth at bedtime.     [provider]  ?nitroGLYCERIN (NITROSTAT) 0.4 MG SL tablet Place 0.4 mg under the tongue every 5 (five) minutes as needed for chest pain.    [provider]  ?NON FORMULARY Diet Order: Mech Soft Liberalized Diet with thin liquids no therapeutic restrictions.    [provider]  ?Nutritional Supplements (NUTRITIONAL SUPPLEMENT PO) Take 1 Container by mouth 2 (two) times a day. Magic Cup - take with lunch and dinner    [provider]  ?ondansetron (ZOFRAN) 4 MG tablet Take 4 mg by mouth every 4 (four) hours as needed for nausea.      [provider]  ?OXYGEN Inhale 2 L/min into the lungs as needed. Wean as able to keep SPO2 greater than 92%    [provider]  ?polyethylene glycol (MIRALAX / GLYCOLAX) packet Take 17 g by mouth daily. 05/01/20   [provider]  ?predniSONE (DELTASONE) 1 MG tablet Take 1 mg by mouth daily with breakfast.     [provider]  ?rOPINIRole (REQUIP) 1 MG tablet 1 mg. Take 1 tablet by mouth twice daily at lunch and at bedtime.    [provider]  ?rOPINIRole (REQUIP) 2 MG tablet Take 2 mg by mouth. every evening .2 hours before  bedtime    [provider]  ?senna-docusate (SENOKOT-S) 8.6-50 MG tablet Take 1 tablet by mouth 2 (two) times daily. 12/18/18   Nita Sells, MD  ?traMADol (ULTRAM) 50 MG tablet Take 1 tablet (50 mg total) by mouth 2 (two) times daily. 01/27/21   [place]argo, Amy E, NP  ?warfarin (COUMADIN) 2.5 MG tablet Take 2.5 mg by mouth daily. On Mon,Wed,Fri,Sun    [provider]  ?warfarin (COUMADIN) 3 MG tablet Take 3 mg by mouth daily. On Tues, Thurs, Sat    [provider]  ? ? ?Physical Exam: ?Vitals:  ? 01/31/22 1330 01/31/22 1345 01/31/22 1400 01/31/22 1415  ?BP: (!) 127/53 (!) 103/44 (!) 104/42 136/65  ?Pulse: 64 (!) 46 (!) 45 (!) 47  ?Resp: '18 14 14 16  '$ ?Temp:      ?TempSrc:      ?SpO2: 100% 99% 100% 99%  ? ?General:  Appears calm and comfortable and is in NAD ?Eyes:  PERRL, EOMI, normal lids, iris ?ENT:  grossly normal hearing, lips & tongue, mmm; poor dentition ?Neck:  no LAD, masses or thyromegaly; no carotid bruits ?Cardiovascular:  RRR, quiet murmur. No LE edema.  ?Respiratory:   CTA bilaterally with no wheezes/rales/rhonchi.  Normal respiratory effort. ?Abdomen:  soft, NT, ND, NABS ?Back:   normal alignment, no CVAT ?Skin:  no rash or induration seen on limited exam. Has ulcer of great left toe, plantar side. Foul smelling, no drainage or surrounding erythema. Probable.  ? ? ?Musculoskeletal:  limited exam. no bony  abnormality ?Lower extremity:  No LE edema.  2+ distal pulses ?Psychiatric:  not responsive, but did look at her son and say his name.   ?Neurologic:  responds to painful stimuli and voice. Could not test remainder of e

## 2022-01-31 NOTE — Assessment & Plan Note (Addendum)
Declining PO intake with weight loss over past few months. Albumin 2.1  ?Will have nutrition see, palliative care may be beneficial as well  ?

## 2022-01-31 NOTE — Assessment & Plan Note (Addendum)
No complaints of chest pain although altered ?ekg with no significant changes ?Check troponin ?continue medical management  ?

## 2022-01-31 NOTE — ED Provider Notes (Signed)
?[facility] ?Provider Note ? ? ?CSN: [id number] ?Arrival date & time: 01/31/22  1031 ? ?  ? ?History ? ?Chief Complaint  ?Patient presents with  ? Altered Mental Status  ? ? ?LESETTE FRARY is a 88 y.o. female.  Level 5 caveat for altered mental status and dementia.  Patient is brought in from her facility for evaluation of decreased mental status.  Reportedly baseline is confused but can carry on a conversation.  Today was noted by staff to be less interactive.  She is not following commands.  EMS concern for possible gaze preference.  Reportedly last known well son talked to her on the phone 12 PM yesterday.  No reported fevers or falls. ? ?The history is provided by the EMS personnel.  ?Altered Mental Status ?Presenting symptoms: partial responsiveness   ?Most recent episode:  Today ?Episode history:  Continuous ?Timing:  Constant ?Progression:  Unchanged ?Chronicity:  New ?Context: nursing home resident   ? ?  ? ?Home Medications ?Prior to Admission medications   ?Medication Sig Start Date End Date Taking? Authorizing Provider  ?acetaminophen (TYLENOL) 325 MG tablet Take 650 mg by mouth every 6 (six) hours as needed for mild pain, moderate pain or fever.    [provider]  ?albuterol (VENTOLIN HFA) 108 (90 Base) MCG/ACT inhaler Inhale 2 puffs into the lungs every 6 (six) hours as needed for wheezing or shortness of breath.    [provider]  ?ALPRAZolam Duanne Moron) 0.25 MG tablet Take 0.25 mg by mouth daily in the afternoon.    [provider]  ?amLODipine (NORVASC) 10 MG tablet Take 10 mg by mouth every morning.     [provider]  ?calcium carbonate (TUMS - DOSED IN MG ELEMENTAL CALCIUM) 500 MG chewable tablet Chew 1 tablet by mouth 3 (three) times daily as needed for indigestion or heartburn.    [provider]  ?Cholecalciferol (VITAMIN D3) 1.25 MG (50000 UT) CAPS Take 1 capsule by mouth once a week.    [provider]   ?donepezil (ARICEPT) 10 MG tablet Take 10 mg by mouth at bedtime.     [provider]  ?Doxepin HCl 3 MG TABS Take 3 mg by mouth at bedtime. For Sleep 05/01/20   [provider]  ?losartan (COZAAR) 50 MG tablet Take 50 mg by mouth at bedtime.     [provider]  ?nitroGLYCERIN (NITROSTAT) 0.4 MG SL tablet Place 0.4 mg under the tongue every 5 (five) minutes as needed for chest pain.    [provider]  ?NON FORMULARY Diet Order: Mech Soft Liberalized Diet with thin liquids no therapeutic restrictions.    [provider]  ?Nutritional Supplements (NUTRITIONAL SUPPLEMENT PO) Take 1 Container by mouth 2 (two) times a day. Magic Cup - take with lunch and dinner    [provider]  ?ondansetron (ZOFRAN) 4 MG tablet Take 4 mg by mouth every 4 (four) hours as needed for nausea.     [provider]  ?OXYGEN Inhale 2 L/min into the lungs as needed. Wean as able to keep SPO2 greater than 92%    [provider]  ?polyethylene glycol (MIRALAX / GLYCOLAX) packet Take 17 g by mouth daily. 05/01/20   [provider]  ?predniSONE (DELTASONE) 1 MG tablet Take 1 mg by mouth daily with breakfast.     [provider]  ?rOPINIRole (REQUIP) 1 MG tablet 1 mg. Take 1 tablet by mouth twice daily at  lunch and at bedtime.    [provider]  ?rOPINIRole (REQUIP) 2 MG tablet Take 2 mg by mouth. every evening .2 hours before bedtime    [provider]  ?senna-docusate (SENOKOT-S) 8.6-50 MG tablet Take 1 tablet by mouth 2 (two) times daily. 12/18/18   Nita Sells, MD  ?traMADol (ULTRAM) 50 MG tablet Take 1 tablet (50 mg total) by mouth 2 (two) times daily. 01/27/21   Fargo, Amy E, NP  ?warfarin (COUMADIN) 2.5 MG tablet Take 2.5 mg by mouth daily. On Mon,Wed,Fri,Sun    [provider]  ?warfarin (COUMADIN) 3 MG tablet Take 3 mg by mouth daily. On Tues, Thurs, Sat    [provider]  ?   ? ?Allergies    ?Ambien  [zolpidem tartrate], Codeine, Codeine phosphate, Darvocet [propoxyphene n-acetaminophen], Fish allergy, Fish-derived products, Hydrocodone, Other, Promethazine, Promethazine hcl, Sulfamethoxazole, Vicodin [hydrocodone-acetaminophen], Amoxicillin, and Penicillins   ? ?Review of Systems   ?Review of Systems  ?Unable to perform ROS: Mental status change  ? ?Physical Exam ?Updated Vital Signs ?BP (!) 112/58   Pulse 78   Temp (!) 97.4 ?F (36.3 ?C) (Axillary)   Resp 16   LMP  (LMP Unknown)   SpO2 98%  ?Physical Exam ?Vitals and nursing note reviewed.  ?Constitutional:   ?   General: She is not in acute distress. ?   Appearance: Normal appearance. She is well-developed.  ?HENT:  ?   Head: Normocephalic and atraumatic.  ?Eyes:  ?   Conjunctiva/sclera: Conjunctivae normal.  ?Cardiovascular:  ?   Rate and Rhythm: Normal rate and regular rhythm.  ?   Heart sounds: No murmur heard. ?Pulmonary:  ?   Effort: Pulmonary effort is normal. No respiratory distress.  ?   Breath sounds: Normal breath sounds.  ?   Comments: She is on 2 L oxygen 24/7 for COPD ?Abdominal:  ?   Palpations: Abdomen is soft.  ?   Tenderness: There is no abdominal tenderness. There is no guarding or rebound.  ?Musculoskeletal:     ?   General: No swelling.  ?   Cervical back: Neck supple.  ?   Comments: She is in bilateral bunny boots.  No gross deformities  ?Skin: ?   General: Skin is warm and dry.  ?   Capillary Refill: Capillary refill takes less than 2 seconds.  ?Neurological:  ?   Comments: Patient with to pain in all 4 extremities.  Not following commands.  Not speaking.  No clear gaze preference.  No facial asymmetry.  ? ? ?ED Results / Procedures / Treatments   ?Labs ?(all labs ordered are listed, but only abnormal results are displayed) ?Labs Reviewed  ?CBC WITH DIFFERENTIAL/PLATELET - Abnormal; Notable for the following components:  ?    Result Value  ? Hemoglobin 10.4 (*)   ? HCT 33.2 (*)   ? RDW 17.0 (*)   ? All other components within normal  limits  ?COMPREHENSIVE METABOLIC PANEL - Abnormal; Notable for the following components:  ? CO2 20 (*)   ? BUN 24 (*)   ? Creatinine, Ser 1.19 (*)   ? Calcium 8.4 (*)   ? Total Protein 5.5 (*)   ? Albumin 2.1 (*)   ? AST 12 (*)   ? GFR, Estimated 41 (*)   ? All other components within normal limits  ?URINALYSIS, ROUTINE W REFLEX MICROSCOPIC - Abnormal; Notable for the following components:  ? Color, Urine AMBER (*)   ? APPearance TURBID (*)   ?  Hgb urine dipstick MODERATE (*)   ? Protein, ur 100 (*)   ? Leukocytes,Ua LARGE (*)   ? RBC / HPF >50 (*)   ? WBC, UA >50 (*)   ? Bacteria, UA MANY (*)   ? All other components within normal limits  ?PROTIME-INR - Abnormal; Notable for the following components:  ? Prothrombin Time 21.7 (*)   ? INR 1.9 (*)   ? All other components within normal limits  ?C-REACTIVE PROTEIN - Abnormal; Notable for the following components:  ? CRP 4.2 (*)   ? All other components within normal limits  ?SEDIMENTATION RATE - Abnormal; Notable for the following components:  ? Sed Rate 71 (*)   ? All other components within normal limits  ?TROPONIN I (HIGH SENSITIVITY) - Abnormal; Notable for the following components:  ? Troponin I (High Sensitivity) 23 (*)   ? All other components within normal limits  ?RESP PANEL BY RT-PCR (FLU A&B, COVID) ARPGX2  ?URINE CULTURE  ?AMMONIA  ?LACTIC ACID, PLASMA  ?TSH  ?VITAMIN B12  ?BASIC METABOLIC PANEL  ?CBC  ?IRON AND TIBC  ?FERRITIN  ?CBG MONITORING, ED  ?TROPONIN I (HIGH SENSITIVITY)  ? ? ?EKG ?EKG Interpretation ? ?Date/Time:  Sunday January 31 2022 10:36:28 EDT ?Ventricular Rate:  79 ?PR Interval:  143 ?QRS Duration: 86 ?QT Interval:  354 ?QTC Calculation: 406 ?R Axis:   -4 ?Text Interpretation: Ectopic atrial rhythm Anterior infarct, old No significant change since prior 2/20 Confirmed by Aletta Edouard [phone]) on 01/31/2022 10:43:22 AM ? ?Radiology ?CT Head Wo Contrast ? ?Result Date: 01/31/2022 ?CLINICAL DATA:  88 year old female with altered mental status.  EXAM: CT HEAD WITHOUT CONTRAST TECHNIQUE: Contiguous axial images were obtained from the base of the skull through the vertex without intravenous contrast. RADIATION DOSE REDUCTION: This exam was performed accordin

## 2022-01-31 NOTE — Progress Notes (Addendum)
Brief Neurology Note ? ?Contacted by Dr. Orma Flaming regarding this 88 yo patient who presented with acute on chronic encephalopathy in the setting of UTI for abnormal finding on head CT. Personal review of head CT shows what appears to be a chronic lacunar infarct in R thalamus that is new from 2021 but is not favored to be acute. Given that patient is on anticoagulation, I recommended MRI brain wo contrast to rule out acute ischemia. If MRI negative for stroke, continue anticoagulation and treat UTI. If MRI shows acute infarct or other significant abnormalities, please consult neurology. ? ?Su Monks, MD ?[facility] ?[phone] ? ?If 7pm- 7am, please page neurology on call as listed in [facility]. ? ?

## 2022-01-31 NOTE — Assessment & Plan Note (Signed)
On 2L oxygen, at her baseline ?Monitor  ?

## 2022-01-31 NOTE — Assessment & Plan Note (Addendum)
No overt neurological deficits on exam; however, CVA new since 2021 in altered patient with dementia on eliquis ?Discussed with neurology, will check MRI and officially consult if any acute findings.  ?

## 2022-01-31 NOTE — Assessment & Plan Note (Signed)
Continue daily low dose steroid and tramadol (hold tramadol until mental status improves) ?

## 2022-01-31 NOTE — Assessment & Plan Note (Signed)
Baseline of conversing and answering questions, although not always correctly ?Today she is non responsive ?Will continue her aricept ?Delirium precautions  ?

## 2022-01-31 NOTE — Assessment & Plan Note (Signed)
Stable on her xanax daily ?Continue  ?

## 2022-01-31 NOTE — Assessment & Plan Note (Addendum)
Records from one year ago show creatinine of 1.0-1.1 ?Continue to monitor  ?Gently IVF overnight  ?Strict I/O ?

## 2022-01-31 NOTE — Assessment & Plan Note (Addendum)
Soft readings, hold home medication of losartan, norvasc  ?

## 2022-01-31 NOTE — Assessment & Plan Note (Addendum)
Continue eliquis  ?

## 2022-01-31 NOTE — Assessment & Plan Note (Addendum)
88 year old female presenting with altered mental status  ?-observation to telemetry ?-although she has dementia this is a change from her baseline.  ?-source likely secondary to UTI. Continue rocephin, urine culture pending ?-CT head shows subacute CVA-MRI brain ordered see below  ?-ammonia wnl, check TSH/B12 with FTT ?-continue gentle IVF and will keep NPO until more alert to reduce risk of aspiration  ?

## 2022-01-31 NOTE — ED Triage Notes (Signed)
Pt arrives via [facility] for stroke like symptoms. Per EMS it was unclear [facility] time, however they state they received collateral information from son who reported he spoke with pt yesterday afternoon at between 1230 and 1300 and pt was at her baseline. Presently pt unable to follow commands, inappropriate/incomprehensible words.  ?

## 2022-01-31 NOTE — Assessment & Plan Note (Signed)
Baseline hgb appears to be around 8-9 ?Check iron studies, continue oral iron  ?

## 2022-02-01 DIAGNOSIS — Z7189 Other specified counseling: Secondary | ICD-10-CM | POA: Diagnosis not present

## 2022-02-01 DIAGNOSIS — F0280 Dementia in other diseases classified elsewhere without behavioral disturbance: Secondary | ICD-10-CM

## 2022-02-01 DIAGNOSIS — N390 Urinary tract infection, site not specified: Secondary | ICD-10-CM

## 2022-02-01 DIAGNOSIS — G9340 Encephalopathy, unspecified: Secondary | ICD-10-CM | POA: Diagnosis not present

## 2022-02-01 DIAGNOSIS — G301 Alzheimer's disease with late onset: Secondary | ICD-10-CM

## 2022-02-01 DIAGNOSIS — Z515 Encounter for palliative care: Secondary | ICD-10-CM

## 2022-02-01 LAB — CBC
HCT: 35.7 % — ABNORMAL LOW (ref 36.0–46.0)
Hemoglobin: 11 g/dL — ABNORMAL LOW (ref 12.0–15.0)
MCH: 26.4 pg (ref 26.0–34.0)
MCHC: 30.8 g/dL (ref 30.0–36.0)
MCV: 85.8 fL (ref 80.0–100.0)
Platelets: 268 10*3/uL (ref 150–400)
RBC: 4.16 MIL/uL (ref 3.87–5.11)
RDW: 17 % — ABNORMAL HIGH (ref 11.5–15.5)
WBC: 10.3 10*3/uL (ref 4.0–10.5)
nRBC: 0 % (ref 0.0–0.2)

## 2022-02-01 LAB — BASIC METABOLIC PANEL
Anion gap: 8 (ref 5–15)
BUN: 18 mg/dL (ref 8–23)
CO2: 14 mmol/L — ABNORMAL LOW (ref 22–32)
Calcium: 8 mg/dL — ABNORMAL LOW (ref 8.9–10.3)
Chloride: 119 mmol/L — ABNORMAL HIGH (ref 98–111)
Creatinine, Ser: 0.93 mg/dL (ref 0.44–1.00)
GFR, Estimated: 56 mL/min — ABNORMAL LOW (ref >60)
Glucose, Bld: 84 mg/dL (ref 70–99)
Potassium: 3.8 mmol/L (ref 3.5–5.1)
Sodium: 141 mmol/L (ref 135–145)

## 2022-02-01 LAB — FERRITIN: Ferritin: 57 ng/mL (ref 11–307)

## 2022-02-01 LAB — IRON AND TIBC
Iron: 29 ug/dL (ref 28–170)
Saturation Ratios: 15 % (ref 10.4–31.8)
TIBC: 199 ug/dL — ABNORMAL LOW (ref 250–450)
UIBC: 170 ug/dL

## 2022-02-01 LAB — GLUCOSE, CAPILLARY
Glucose-Capillary: 76 mg/dL (ref 70–99)
Glucose-Capillary: 98 mg/dL (ref 70–99)
Glucose-Capillary: 128 mg/dL — ABNORMAL HIGH (ref 70–99)

## 2022-02-01 LAB — MAGNESIUM: Magnesium: 1.8 mg/dL (ref 1.7–2.4)

## 2022-02-01 LAB — C-REACTIVE PROTEIN: CRP: 6.4 mg/dL — ABNORMAL HIGH (ref <1.0)

## 2022-02-01 MED ORDER — MEDIHONEY WOUND/BURN DRESSING EX PSTE
1.0000 "application " | PASTE | Freq: Every day | CUTANEOUS | Status: DC
  Administered 2022-02-02: 1 via TOPICAL
  Filled 2022-02-01: qty 44

## 2022-02-01 MED ORDER — SODIUM CHLORIDE 0.9 % IV SOLN: INTRAVENOUS | Status: DC

## 2022-02-01 MED ORDER — CARVEDILOL 3.125 MG PO TABS
3.1250 mg | ORAL_TABLET | Freq: Two times a day (BID) | ORAL | Status: DC
  Administered 2022-02-01: 3.125 mg via ORAL
  Filled 2022-02-01: qty 1

## 2022-02-01 MED ORDER — PROSOURCE PLUS PO LIQD
30.0000 mL | Freq: Two times a day (BID) | ORAL | Status: DC
  Administered 2022-02-01 – 2022-02-02 (×3): 30 mL via ORAL
  Filled 2022-02-01 (×3): qty 30

## 2022-02-01 MED ORDER — TRAMADOL HCL 50 MG PO TABS: 50.0000 mg | ORAL_TABLET | Freq: Two times a day (BID) | ORAL | Status: DC | PRN

## 2022-02-01 NOTE — Progress Notes (Signed)
?                                  PROGRESS NOTE                                             ?                                                                                                                     ?                                         ? ? Patient Demographics:  ? ? [name] [name], is a 88 y.o. female, DOB - 1921/09/07, LDJ:[id number] ? ?Outpatient Primary MD for the patient is Sherril Croon Satira Anis, MD    LOS - 0  Admit date - 01/31/2022   ? ?Chief Complaint  ?Patient presents with  ? Altered Mental Status  ?    ? ?Brief Narrative (HPI from H&P)  - 88 y.o. female with medical history significant of HTN, CAD s/p PCI, hx of PE/DVT on eliquis, alzheimer's disease, CKD stage III, depression and anxiety, dysphagia, PMR, chronic respiratory failure on 2L oxygen,  and FTT who presented to ED with complaints of AMS ? ? Subjective:  ? ? Chestine Spore today has, No headache, No chest pain, No abdominal pain - No Nausea, No new weakness tingling or numbness, no SOB. ? ? Assessment  & Plan :  ? ? ?Acute Toxic Encephalopathy - 88 year old female presenting with altered mental status due to UTI and dehydration, CT and MRI show a subacute stroke but not acute, she is largely bedbound.  With IV fluids and antibiotics she is clinically improving.,  TSH and B12 are stable.  PT OT and speech to eval.  If better likely back to [facility] in a day or two. ? ?Remote to subacute right thalamic CVA -  No overt neurological deficits on exam; she is bedbound at baseline, already on Eliquis which will be continued, no new change. ? ?Chronic ulcer of great toe of left foot ([place]) - Has had an ulcer on plantar aspect of great left toe for > year per son.  Stable x-ray, wound care, outpatient follow-up with orthopedics if debridement needed. ? ?CKD (chronic kidney disease), stage III ([place])  Records from one year ago show creatinine of 1.0-1.1 ?Continue to monitor with gentle hydration. ? ?Dementia ([place])  - Baseline but conversant, improving, continue home dose Aricept and supportive care ? ?Chronic respiratory failure (HCC) - On 2L oxygen, at her baseline, monitor . ? ?CAD (coronary artery disease)  - No complaints of chest pain, stable EKG no acute issue .  That  non-ACS pattern troponin rise which is minimal.  Continue Eliquis. ?  ?Anxiety - Stable on her xanax daily.  ? ?HYPERTENSION, BENIGN - Soft readings, hold home medication of losartan, norvasc.  Once blood pressure better likely better candidate for low-dose beta-blocker deferred to her underlying CAD ? ?PMR (polymyalgia rheumatica) (HCC) - Continue daily low dose steroid and tramadol (hold tramadol until mental status improves) ? ?HX of PE/VTE - Continue eliquis ? ?Moderate protein malnutrition (HCC) - Declining PO intake with weight loss over past few months. Albumin 2.1.  Will add protein supplements orally. ? ?Normocytic anemia - Baseline hgb appears to be around 8-9, pending  iron studies, continue oral iron. ? ?  ? ?   ? ?Condition - Extremely Guarded ? ?Family Communication  :  Corrie Mckusick [phone] - 02/01/22 ? ?Code Status :  DNR ? ?Consults  :  None ? ?PUD Prophylaxis :  ? ? Procedures  :    ? ?MRI - 1. No acute intracranial process. 2. Right thalamic lacunar infarct, which is new compared to the MRI from 04/29/2020 ? ?   ? ?Disposition Plan  :   ? ?Status is: Observation ? ? ?DVT Prophylaxis  :   ? ?apixaban (ELIQUIS) tablet 2.5 mg Start: 01/31/22 1400 ?apixaban (ELIQUIS) tablet 2.5 mg  ? ?Lab Results  ?Component Value Date  ? PLT 366 01/31/2022  ? ? ?Diet :  ?Diet Order   ? ?       ?  Diet NPO time specified Except for: Sips with Meds  Diet effective now       ?  ? ?  ?  ? ?  ?  ? ?Inpatient Medications ? ?Scheduled Meds: ? apixaban  2.5 mg Oral BID  ? donepezil  10 mg Oral QHS  ? ferrous sulfate  324 mg Oral Daily  ? leptospermum manuka honey  1 application. Topical Daily  ? polyethylene glycol  17 g Oral Daily  ? predniSONE  1 mg Oral Q  breakfast  ? rOPINIRole  1 mg Oral BID  ? rOPINIRole  2 mg Oral Daily  ? senna-docusate  1 tablet Oral BID  ? ?Continuous Infusions: ? sodium chloride 75 mL/hr at 02/01/22 0651  ? cefTRIAXone (ROCEPHIN)  IV    ? ?PRN Meds:.acetaminophen, morphine injection, nitroGLYCERIN ? ?Antibiotics  :   ? ?Anti-infectives (From admission, onward)  ? ? Start     Dose/Rate Route Frequency Ordered Stop  ? 02/01/22 1200  cefTRIAXone (ROCEPHIN) 1 g in sodium chloride 0.9 % 100 mL IVPB       ? 1 g ?200 mL/hr over 30 Minutes Intravenous Every 24 hours 01/31/22 1341 02/06/22 1159  ? 01/31/22 1230  cefTRIAXone (ROCEPHIN) 1 g in sodium chloride 0.9 % 100 mL IVPB       ? 1 g ?200 mL/hr over 30 Minutes Intravenous  Once 01/31/22 1224 01/31/22 1303  ? ?  ? ? ? Time Spent in minutes  30 ? ? ?Lala Lund M.D on 02/01/2022 at 8:54 AM ? ?To page go to [email or web address]  ? ?[facility] -  Office  [phone] ? ?See all Orders from today for further details ? ? ? Objective:  ? ?Vitals:  ? 01/31/22 2000 01/31/22 2344 02/01/22 8341 02/01/22 9622  ?BP: 137/66 119/69 133/62 (!) 119/51  ?Pulse: 83 71 60 86  ?Resp: '18 15 16 17  '$ ?Temp:  97.9 ?F (36.6 ?C) 98.1 ?F (36.7 ?C) 97.9 ?F (36.6 ?C)  ?TempSrc:  Axillary  Oral Oral  ?SpO2:  100% 100% 100%  ? ? ?Wt Readings from Last 3 Encounters:  ?01/15/21 60 kg  ?12/25/20 60.9 kg  ?12/24/20 60.9 kg  ? ? ? ?Intake/Output Summary (Last 24 hours) at 02/01/2022 0854 ?Last data filed at 01/31/2022 1850 ?Gross per 24 hour  ?Intake 5.71 ml  ?Output 100 ml  ?Net -94.29 ml  ? ? ? ?Physical Exam ? ?Awake Alert x 2, No new F.N deficits,   ?[place].AT,PERRAL ?Supple Neck, No JVD,   ?Symmetrical Chest wall movement, Good air movement bilaterally, CTAB ?RRR,No Gallops,Rubs or new Murmurs,  ?+ve B.Sounds, Abd Soft, No tenderness,   ?No Cyanosis, Clubbing or edema  ?  ? ?RN pressure injury documentation: ?Pressure Injury 12/18/18 Deep Tissue Injury - Purple or maroon localized area of discolored intact skin or blood-filled blister  due to damage of underlying soft tissue from pressure and/or shear. non blanchable, maroon area L buttocks (Active)  ?12/18/18 0950  ?Location: Buttocks  ?Location Orientation: Left  ?Staging: Deep Tissue Injury - Purple or maroon localized area of discolored intact skin or blood-filled blister due to damage of underlying soft tissue from pressure and/or shear.  ?Wound Description (Comments): non blanchable, maroon area L buttocks  ?Present on Admission: No  ? ? ? Data Review:  ? ? ?CBC ?Recent Labs  ?Lab 01/31/22 ?1043  ?WBC 10.2  ?HGB 10.4*  ?HCT 33.2*  ?PLT 366  ?MCV 85.6  ?MCH 26.8  ?MCHC 31.3  ?RDW 17.0*  ?LYMPHSABS 2.6  ?MONOABS 0.5  ?EOSABS 0.3  ?BASOSABS 0.1  ? ? ?Electrolytes ?Recent Labs  ?Lab 01/31/22 ?1043 01/31/22 ?1341  ?NA 136  --   ?K 3.8  --   ?CL 110  --   ?CO2 20*  --   ?GLUCOSE 96  --   ?BUN 24*  --   ?CREATININE 1.19*  --   ?CALCIUM 8.4*  --   ?AST 12*  --   ?ALT 11  --   ?ALKPHOS 59  --   ?BILITOT 0.4  --   ?ALBUMIN 2.1*  --   ?CRP 4.2*  --   ?LATICACIDVEN 1.3  --   ?INR 1.9*  --   ?TSH  --  0.867  ?AMMONIA <10  --   ? ? ?------------------------------------------------------------------------------------------------------------------ ?No results for input(s): CHOL, HDL, LDLCALC, TRIG, CHOLHDL, LDLDIRECT in the last 72 hours. ? ?Lab Results  ?Component Value Date  ? HGBA1C 5.7 08/01/2018  ? ? ?Recent Labs  ?  01/31/22 ?1341  ?TSH 0.867  ? ?------------------------------------------------------------------------------------------------------------------ ?ID Labs ?Recent Labs  ?Lab 01/31/22 ?1043  ?WBC 10.2  ?PLT 366  ?CRP 4.2*  ?LATICACIDVEN 1.3  ?CREATININE 1.19*  ? ?Cardiac Enzymes ?No results for input(s): CKMB, TROPONINI, MYOGLOBIN in the last 168 hours. ? ?Invalid input(s): CK ? ? ?Radiology Reports ?CT Head Wo Contrast ? ?Result Date: 01/31/2022 ?CLINICAL DATA:  88 year old female with altered mental status. EXAM: CT HEAD WITHOUT CONTRAST TECHNIQUE: Contiguous axial images were obtained from  the base of the skull through the vertex without intravenous contrast. RADIATION DOSE REDUCTION: This exam was performed according to the departmental dose-optimization program which includes automated expo

## 2022-02-01 NOTE — TOC Initial Note (Signed)
Transition of Care (TOC) - Initial/Assessment Note  ? ? ?Patient Details  ?Name: [name] [name] ?MRN: [id number] ?Date of Birth: 23-Jun-1921 ? ?Transition of Care (TOC) CM/SW Contact:    ?Benard Halsted, LCSW ?Phone Number: ?02/01/2022, 12:32 PM ? ?Clinical Narrative:                 ?CSW spoke with patient's son. He confirmed discharge plan is to return to [facility] and patient will require non-emergency EMS for transport. Per MD patient likely ready for discharge tomorrow; CSW scheduled [facility] for pickup tomorrow at 1pm. Will cancel if needed. SNF aware.  ? ?Expected Discharge Plan: [place] ?Barriers to Discharge: Continued Medical Work up ? ? ?Patient Goals and CMS Choice ?Patient states their goals for this hospitalization and ongoing recovery are:: Return to SNF ?CMS Medicare.gov Compare Post Acute Care list provided to:: Patient Represenative (must comment) ?Choice offered to / list presented to : Adult Children ? ?Expected Discharge Plan and Services ?Expected Discharge Plan: [place] ?In-house Referral: Clinical Social Work ?  ?Post Acute Care Choice: [facility] ?Living arrangements for the past 2 months: [facility] ?                ?  ?  ?  ?  ?  ?  ?  ?  ?  ?  ? ?Prior Living Arrangements/Services ?Living arrangements for the past 2 months: [place] ?Lives with:: Facility Resident ?Patient language and need for interpreter reviewed:: Yes ?Do you feel safe going back to the place where you live?: Yes      ?Need for Family Participation in Patient Care: Yes (Comment) ?Care giver support system in place?: Yes (comment) ?  ?Criminal Activity/Legal Involvement Pertinent to Current Situation/Hospitalization: No - Comment as needed ? ?Activities of Daily Living ?  ?  ? ?Permission Sought/Granted ?Permission sought to share information with : Facility Sport and exercise psychologist, Family Supports ?Permission granted to share information with  : No ? Share Information with NAME: [name] ? Permission granted to share info w AGENCY: [place] ? Permission granted to share info w Relationship: Son ? Permission granted to share info w Contact Information: [phone] ? ?Emotional Assessment ?Appearance:: Appears stated age ?Attitude/Demeanor/Rapport: Unable to Assess ?Affect (typically observed): Unable to Assess ?Orientation: : Oriented to Self ?Alcohol / Substance Use: Not Applicable ?Psych Involvement: No (comment) ? ?Admission diagnosis:  Encephalopathy acute [G93.40] ?Patient Active Problem List  ? Diagnosis Date Noted  ? Encephalopathy acute 01/31/2022  ? Chronic respiratory failure ([place]) 01/31/2022  ? Chronic ulcer of great toe of left foot ([place]) 01/31/2022  ? remote right thalamic CVA 01/31/2022  ? Normocytic anemia 01/31/2022  ? Mass in region of sella turcica present on magnetic resonance imaging 05/09/2020  ? Acute cystitis without hematuria 05/09/2020  ? Late onset Alzheimer's disease without behavioral disturbance ([place]) 04/08/2020  ? Failure to thrive in adult 01/08/2019  ? Moderate protein malnutrition ([place]) 01/08/2019  ? Pressure injury of skin 12/18/2018  ? Intertrochanteric fracture of left femur ([place]) 12/11/2018  ? CKD (chronic kidney disease), stage III ([place]) 12/11/2018  ? Depression with anxiety 12/11/2018  ? Overactive bladder 09/24/2017  ? Insomnia 07/31/2017  ? Anxiety 06/16/2017  ? History of pulmonary embolus (PE) 03/19/2017  ? Candidal intertrigo 03/19/2017  ? Compression fracture of L2 ([place]) 03/12/2017  ? Acute lower UTI 03/12/2017  ? Accelerated hypertension 03/12/2017  ? Anticoagulated on Coumadin 07/28/2015  ? Back pain 07/20/2015  ?  Stricture and stenosis of esophagus 10/10/2012  ? Dementia ([place]) 10/06/2012  ? Dysphagia 10/06/2012  ? Near syncope 08/24/2012  ? Weakness of both legs 06/09/2012  ? PMR (polymyalgia rheumatica) (HCC)   ? Restless leg syndrome   ? Angina pectoris, unstable ([place]) 03/02/2012  ? Dyspnea 02/03/2012  ?  CAD (coronary artery disease)   ? hx of PE/VTE 10/01/2010  ? HYPERCHOLESTEROLEMIA  IIA 08/27/2009  ? HYPERTENSION, BENIGN 12/11/2008  ? ?PCP:  Elmore Guise, MD ?Pharmacy:   ?[facility] [place] [address] ?[place] [address] ?[address] ?[place] [address] ?Phone: [phone] Fax: [phone] ? ? ? ? ?Social Determinants of Health (SDOH) Interventions ?  ? ?Readmission Risk Interventions ?   ? View : No data to display.  ?  ?  ?  ? ? ? ?

## 2022-02-01 NOTE — Evaluation (Signed)
Clinical/Bedside Swallow Evaluation ?Patient Details  ?Name: [name] [name] ?MRN: [id number] ?Date of Birth: 04/13/1921 ? ?Today's Date: 02/01/2022 ?Time: SLP Start Time (ACUTE ONLY): 6301 SLP Stop Time (ACUTE ONLY): 1339 ?SLP Time Calculation (min) (ACUTE ONLY): 15 min ? ?Past Medical History:  ?Past Medical History:  ?Diagnosis Date  ? Anemia 03/02/2012  ? Angina pectoris, unstable ([place]) 03/02/2012  ? Anxiety   ? Axillary abscess   ? Bradycardia 06/13/2012  ? CAD (coronary artery disease)   ? S/p PTCA / stenting (last cath 2004, multiple LAD stents, 2 stents in the right coronary artery all patent)  ? Cancer [facility])   ? Complication of anesthesia   ? Compression fracture of L2 ([place]) 03/12/2017  ? Dementia ([place])   ? Dysphagia 10/06/2012  ? Dyspnea 02/03/2012  ? GERD (gastroesophageal reflux disease)   ? History of pulmonary embolus (PE) 03/19/2017  ? HTN (hypertension)   ? PMR (polymyalgia rheumatica) (HCC)   ? PONV (postoperative nausea and vomiting)   ? Restless leg syndrome   ? VTE (venous thromboembolism) 10/2010  ? DVT and PE. Started coumadin  ? Weakness of both legs 06/09/2012  ? ?Past Surgical History:  ?Past Surgical History:  ?Procedure Laterality Date  ? BREAST LUMPECTOMY    ? COLECTOMY    ? ESOPHAGOGASTRODUODENOSCOPY (EGD) WITH ESOPHAGEAL DILATION  10/10/2012  ? Procedure: ESOPHAGOGASTRODUODENOSCOPY (EGD) WITH ESOPHAGEAL DILATION;  Surgeon: Inda Castle, MD;  Location: [facility];  Service: Endoscopy;  Laterality: N/A;  ? FEMUR IM NAIL Left 12/13/2018  ? Procedure: INTRAMEDULLARY (IM) NAIL FEMORAL LEFT;  Surgeon: Paralee Cancel, MD;  Location: [facility];  Service: Orthopedics;  Laterality: Left;  ? heart stents  x 8  ? INCISION AND DRAINAGE    ? bilateral axillary, non specific staff  ? INCISION AND DRAINAGE ABSCESS  09/28/2012  ? Procedure: INCISION AND DRAINAGE ABSCESS;  Surgeon: Zenovia Jarred, MD;  Location: [facility];  Service: General;  Laterality: Bilateral;  ? stent    ? cardiac x 8 stents.  ? ?HPI:  ?Pt is a 88  yo female presenting from SNF with AMS d/t suspected encephalopathy. MRI revealed no acute changes but with R thalamic lacunar infarct that is new compared to MRI from 04/29/20. Swallow eval 10/07/12 concerning for primary esophageal dysphagia, improved after EGD. that admission which found stricture that was stretched. Additional eval 02/29/16 WNL but with pt report of occasional globus sensation. PMH: HTN, CAD s/p PCI, hx of PE/DVT, Alzheimer's disease, CKD 3, dysphagia, chronic respiratory failure on 2 L O2.  ?  ?Assessment / Plan / Recommendation  ?Clinical Impression ? Pt has prolonged mastication with solids, with coughing noted after a bite of a dry graham cracker. With slightly softened textures, pt's swallow appears to be more funtional. Given mentation and concern for esophageal issues in the past, suspect that mechanical soft diet might be more appropriate. Will adjust soft diet to mechanical soft so that foods will come a little softer, continuing with thin liquids. Suspect that she is at or near her baseline for swallowing, although no family/caregiver present to confirm. ?SLP Visit Diagnosis: Dysphagia, unspecified (R13.10) ?   ?Aspiration Risk ? Mild aspiration risk  ?  ?Diet Recommendation Dysphagia 3 (Mech soft);Thin liquid  ? ?Liquid Administration via: Cup;Straw ?Medication Administration: Whole meds with puree ?Supervision: Staff to assist with self feeding ?Compensations: Slow rate;Small sips/bites;Follow solids with liquid ?Postural Changes: Seated upright at 90 degrees;Remain upright for at least 30 minutes after po intake  ?  ?  Other  Recommendations Oral Care Recommendations: Oral care BID   ? ?Recommendations for follow up therapy are one component of a multi-disciplinary discharge planning process, led by the attending physician.  Recommendations may be updated based on patient status, additional functional criteria and insurance authorization. ? ?Follow up Recommendations Skilled  nursing-short term rehab (<3 hours/day)  ? ? ?  ?Assistance Recommended at Discharge Frequent or constant Supervision/Assistance  ?Functional Status Assessment Patient has had a recent decline in their functional status and demonstrates the ability to make significant improvements in function in a reasonable and predictable amount of time.  ?Frequency and Duration min 1 x/week  ?1 week ?  ?   ? ?Prognosis Prognosis for Safe Diet Advancement: Fair ?Barriers to Reach Goals: Cognitive deficits  ? ?  ? ?Swallow Study   ?General HPI: Pt is a 88 yo female presenting from SNF with AMS d/t suspected encephalopathy. MRI revealed no acute changes but with R thalamic lacunar infarct that is new compared to MRI from 04/29/20. Swallow eval 10/07/12 concerning for primary esophageal dysphagia, improved after EGD. that admission which found stricture that was stretched. Additional eval 02/29/16 WNL but with pt report of occasional globus sensation. PMH: HTN, CAD s/p PCI, hx of PE/DVT, Alzheimer's disease, CKD 3, dysphagia, chronic respiratory failure on 2 L O2. ?Type of Study: Bedside Swallow Evaluation ?Previous Swallow Assessment: see HPI ?Diet Prior to this Study: Dysphagia 3 (soft);Thin liquids ?Temperature Spikes Noted: No ?Respiratory Status: Nasal cannula ?History of Recent Intubation: No ?Behavior/Cognition: Alert;Cooperative;Requires cueing ?Oral Cavity Assessment: Within Functional Limits ?Oral Care Completed by SLP: No ?Oral Cavity - Dentition: Poor condition;Missing dentition ?Self-Feeding Abilities: Total assist ?Patient Positioning: Upright in bed ?Baseline Vocal Quality: Normal ?Volitional Cough: Cognitively unable to elicit ?Volitional Swallow: Unable to elicit  ?  ?Oral/Motor/Sensory Function Overall Oral Motor/Sensory Function:  (difficulty following commands to assess)   ?Ice Chips Ice chips: Not tested   ?Thin Liquid Thin Liquid: Within functional limits ?Presentation: Straw  ?  ?Nectar Thick Nectar Thick Liquid:  Not tested   ?Honey Thick Honey Thick Liquid: Not tested   ?Puree Puree: Within functional limits ?Presentation: Spoon   ?Solid ? ? ?  Solid: Impaired ?Oral Phase Functional Implications: Impaired mastication;Prolonged oral transit ?Pharyngeal Phase Impairments: Cough - Immediate  ? ?  ? ?Osie Bond., M.A. CCC-SLP ?Acute Rehabilitation Services ?Pager [phone] ?Office [phone] ? ?02/01/2022,1:57 PM ? ? ? ?

## 2022-02-01 NOTE — Progress Notes (Signed)
PT Cancellation Note ? ?Patient Details ?Name: [name] ?MRN: [id number] ?DOB: [birth year] ? ? ?Cancelled Treatment:    Reason Eval/Treat Not Completed: PT screened, no needs identified, will sign off. Pt is a long term care resident that requires manual assist of 2 people for any activity. Pt at baseline per OT and no skilled PT needed.  ? ? ?[name] [facility] ?02/01/2022, 11:59 AM ?[name] PT ?Acute Rehabilitation Services ?Pager [phone] ?Office [phone] ? ?

## 2022-02-01 NOTE — Progress Notes (Signed)
?  Transition of Care (TOC) Screening Note ? ? ?Patient Details  ?Name: [name] [name] ?Date of Birth: Sep 08, 1921 ? ? ?Transition of Care (TOC) CM/SW Contact:    ?Cyndi Bender, RN ?Phone Number: ?02/01/2022, 8:51 AM ? ? ? ?Transition of Care Department [facility]) has reviewed patient and no TOC needs have been identified at this time. We will continue to monitor patient advancement through interdisciplinary progression rounds. If new patient transition needs arise, please place a TOC consult. ? ? ?

## 2022-02-01 NOTE — Consult Note (Signed)
? ?                                                                                ?Consultation Note ?Date: 02/01/2022  ? ?Patient Name: [name] [name]  ?DOB: 1921/05/03  MRN: [id number]  Age / Sex: 88 y.o., female  ?PCP: Elmore Guise, MD ?Referring Physician: Thurnell Lose, MD ? ?Reason for Consultation: Establishing goals of care ? ?HPI/Patient Profile: 88 y.o. female  with past medical history of Alzheimer's dementia, HTN, CAD s/p PCI, h/o PE/DVT on Eliquis, CKD stage 3, PMR, chronic respiratory failure 2L oxygen chronically, dysphagia, depression/anxiety, failure to thrive admitted on 01/31/2022 with altered mental status likely from UTI, remote right thalamic CVA (newly discovered), chronic ulcer left great toe.  ? ?Clinical Assessment and Goals of Care: ?I met today at Ms. Curtner's bedside and she was initially sleeping but awoke to my voice. She answers questions appropriately - oriented to person and place and following simple commands. She has no complaints. When I ask about if she has pain she shrugs and says "a little bit."  ? ?I revisited this afternoon with son, [name], at bedside. He reports that she is much improved and very close to her baseline. He spoke with Dr. Candiss Norse this morning and was updated on her condition. He agrees with back to facility tomorrow if she remains stable. She did not eat much lunch and he reports that she typically eats more finely minced meats and soft foods - downgraded diet to dys 2 which is more aligned with baseline diet. She enjoys Ensure (drank a whole one during my visit). Son reports that she has had gradual ongoing weight loss. We discussed progression of dementia. Goals clarified to fix what we can but to focus on her comfort and quality of life. I explained that [employer] will likely help him get additional help/staff (like hospice) when her condition declines further but I do not believe she needs this at this time. Family is very realistic and  just want her to be happy and comfortable.  ? ?All questions/concerns addressed. Emotional support provided.  ? ?Primary Decision Maker ?NEXT OF KIN adult son and daughter ?  ? ?SUMMARY OF RECOMMENDATIONS   ?- DNR ?- Return to nursing facility (can consider hospice in the future when she declines further) ? ?Code Status/Advance Care Planning: ?DNR ? ? ?Symptom Management:  ?Per attending.  ? ?Palliative Prophylaxis:  ?Aspiration, Bowel Regimen, Delirium Protocol, Frequent Pain Assessment, and Turn Reposition ? ? ?Prognosis:  ?Much improved from admission with treatment of UTI. Ongoing gradual weight loss and decline with underlying dementia.  ? ?Discharge Planning: Nursing facility.  ? ?  ? ?Primary Diagnoses: ?Present on Admission: ? CKD (chronic kidney disease), stage III ([place]) ? Dementia ([place]) ? HYPERTENSION, BENIGN ? Moderate protein malnutrition ([place]) ? hx of PE/VTE ? CAD (coronary artery disease) ? PMR (polymyalgia rheumatica) (HCC) ? Encephalopathy acute ? Acute lower UTI ? Anxiety ? ? ?I have reviewed the medical record, interviewed the patient and family, and examined the patient. The following aspects are pertinent. ? ?Past Medical History:  ?Diagnosis Date  ? Anemia 03/02/2012  ? Angina pectoris, unstable (  HCC) 03/02/2012  ? Anxiety   ? Axillary abscess   ? Bradycardia 06/13/2012  ? CAD (coronary artery disease)   ? S/p PTCA / stenting (last cath 2004, multiple LAD stents, 2 stents in the right coronary artery all patent)  ? Cancer (HCC)   ? Complication of anesthesia   ? Compression fracture of L2 (HCC) 03/12/2017  ? Dementia (HCC)   ? Dysphagia 10/06/2012  ? Dyspnea 02/03/2012  ? GERD (gastroesophageal reflux disease)   ? History of pulmonary embolus (PE) 03/19/2017  ? HTN (hypertension)   ? PMR (polymyalgia rheumatica) (HCC)   ? PONV (postoperative nausea and vomiting)   ? Restless leg syndrome   ? VTE (venous thromboembolism) 10/2010  ? DVT and PE. Started coumadin  ? Weakness of both legs 06/09/2012  ? ?Social  History  ? ?Socioeconomic History  ? Marital status: Widowed  ?  Spouse name: Not on file  ? Number of children: Not on file  ? Years of education: Not on file  ? Highest education level: Not on file  ?Occupational History  ? Occupation: housewife  ?Tobacco Use  ? Smoking status: Never  ? Smokeless tobacco: Never  ?Vaping Use  ? Vaping Use: Never used  ?Substance and Sexual Activity  ? Alcohol use: No  ? Drug use: No  ? Sexual activity: Never  ?Other Topics Concern  ? Not on file  ?Social History Narrative  ? She lives at home with her son and is still ambulatory daily with a cane or walker. She denies any history of smoking. ..Mother died at 57 from a myocardial infarction.  Father  ?  died at 73 from a stroke, possible history of PE. She has nine brothers and  ?  sisters, one sister with bypass and a brother with bypass, also has a  ?  brother who died abruptly of a pulmonary embolism.  ?   ? Admitted to [name] 03/16/17  ? Alcohol none  ? DNR  ?   ? ?Social Determinants of Health  ? ?Financial Resource Strain: Not on file  ?Food Insecurity: Not on file  ?Transportation Needs: Not on file  ?Physical Activity: Not on file  ?Stress: Not on file  ?Social Connections: Not on file  ? ?Family History  ?Problem Relation Age of Onset  ? Heart failure Mother   ? Stroke Father   ? Breast cancer Sister   ? Heart disease Sister   ? Heart disease Brother   ? ?Scheduled Meds: ? (feeding supplement) PROSource Plus  30 mL Oral BID BM  ? apixaban  2.5 mg Oral BID  ? carvedilol  3.125 mg Oral BID WC  ? donepezil  10 mg Oral QHS  ? ferrous sulfate  324 mg Oral Daily  ? leptospermum manuka honey  1 application. Topical Daily  ? polyethylene glycol  17 g Oral Daily  ? predniSONE  1 mg Oral Q breakfast  ? rOPINIRole  1 mg Oral BID  ? rOPINIRole  2 mg Oral Daily  ? senna-docusate  1 tablet Oral BID  ? ?Continuous Infusions: ? sodium chloride 75 mL/hr at 02/01/22 0651  ? cefTRIAXone (ROCEPHIN)  IV    ? ?PRN  Meds:.acetaminophen, morphine injection, nitroGLYCERIN ?Allergies  ?Allergen Reactions  ? Ambien [Zolpidem Tartrate] Nausea And Vomiting  ? Codeine   ? Codeine Phosphate Nausea And Vomiting  ? Darvocet [Propoxyphene N-Acetaminophen] Nausea And Vomiting  ? Fish Allergy   ? Fish-Derived Products   ?   Hydrocodone   ? Other   ?  Chocolate Flavor.   ? Promethazine   ? Promethazine Hcl Nausea And Vomiting  ? Sulfamethoxazole Other (See Comments)  ?  Pt doesn't remember reaction  ? Vicodin [Hydrocodone-Acetaminophen] Other (See Comments)  ?  Unknown reaction  ? Amoxicillin Nausea And Vomiting and Rash  ? Penicillins Nausea And Vomiting and Rash  ? ?Review of Systems  ?Unable to perform ROS: Dementia  ? ?Physical Exam ?Vitals and nursing note reviewed.  ?Cardiovascular:  ?   Rate and Rhythm: Normal rate.  ?Pulmonary:  ?   Effort: No tachypnea, accessory muscle usage or respiratory distress.  ?Abdominal:  ?   General: Abdomen is flat.  ?   Palpations: Abdomen is soft.  ?Neurological:  ?   Mental Status: She is alert.  ?   Comments: Oriented to person and place  ? ? ?Vital Signs: BP (!) 119/51 (BP Location: Right Arm)   Pulse 86   Temp 97.9 ?F (36.6 ?C) (Oral)   Resp 17   LMP  (LMP Unknown)   SpO2 100%  ?Pain Scale: PAINAD ?  ?Pain Score: Asleep ? ? ?SpO2: SpO2: 100 % ?O2 Device:SpO2: 100 % ?O2 Flow Rate: .O2 Flow Rate (L/min): 2 L/min ? ?IO: Intake/output summary:  ?Intake/Output Summary (Last 24 hours) at 02/01/2022 0959 ?Last data filed at 01/31/2022 1850 ?Gross per 24 hour  ?Intake 5.71 ml  ?Output 100 ml  ?Net -94.29 ml  ? ? ?LBM: Last BM Date :  (PTA) ?Baseline Weight:   ?Most recent weight:       ?Palliative Assessment/Data: ? ? ? ? ?Time Total: 55 min ? ?Greater than 50%  of this time was spent counseling and coordinating care related to the above assessment and plan. ? ?Signed by: ? , NP ?Palliative Medicine Team ?Pager # [phone] (M-F 8a-5p) ?Team Phone # [phone] (Nights/Weekends) ?   ? ? ? ? ? ? ? ? ? ? ? ? ?  ?

## 2022-02-01 NOTE — [place] FL2 (Signed)
?[place] MEDICAID FL2 LEVEL OF CARE SCREENING TOOL  ?  ? ?IDENTIFICATION  ?Patient Name: ?[name] [name] Birthdate: 03-28-1921 Sex: female Admission Date (Current Location): ?01/31/2022  ?[place] and [place] Number: ? [place] ?  Facility and Address:  ?[facility] [place] [address], [place], [place] [address] ?     Provider Number: ?[id number]  ?Attending Physician Name and Address:  ?Thurnell Lose, MD ? Relative Name and Phone Number:  ?  ?   ?Current Level of Care: ?Hospital Recommended Level of Care: ?[place] Prior Approval Number: ?  ? ?Date Approved/Denied: ?  PASRR Number: ?  ? ?Discharge Plan: ?SNF ?  ? ?Current Diagnoses: ?Patient Active Problem List  ? Diagnosis Date Noted  ? Encephalopathy acute 01/31/2022  ? Chronic respiratory failure ([place]) 01/31/2022  ? Chronic ulcer of great toe of left foot ([place]) 01/31/2022  ? remote right thalamic CVA 01/31/2022  ? Normocytic anemia 01/31/2022  ? Mass in region of sella turcica present on magnetic resonance imaging 05/09/2020  ? Acute cystitis without hematuria 05/09/2020  ? Late onset Alzheimer's disease without behavioral disturbance ([place]) 04/08/2020  ? Failure to thrive in adult 01/08/2019  ? Moderate protein malnutrition ([place]) 01/08/2019  ? Pressure injury of skin 12/18/2018  ? Intertrochanteric fracture of left femur ([place]) 12/11/2018  ? CKD (chronic kidney disease), stage III ([place]) 12/11/2018  ? Depression with anxiety 12/11/2018  ? Overactive bladder 09/24/2017  ? Insomnia 07/31/2017  ? Anxiety 06/16/2017  ? History of pulmonary embolus (PE) 03/19/2017  ? [place] intertrigo 03/19/2017  ? Compression fracture of L2 ([place]) 03/12/2017  ? Acute lower UTI 03/12/2017  ? Accelerated hypertension 03/12/2017  ? Anticoagulated on Coumadin 07/28/2015  ? Back pain 07/20/2015  ? Stricture and stenosis of esophagus 10/10/2012  ? Dementia ([place]) 10/06/2012  ? Dysphagia 10/06/2012  ? Near syncope 08/24/2012  ? Weakness of both legs  06/09/2012  ? PMR (polymyalgia rheumatica) (HCC)   ? Restless leg syndrome   ? Angina pectoris, unstable ([place]) 03/02/2012  ? Dyspnea 02/03/2012  ? CAD (coronary artery disease)   ? hx of PE/VTE 10/01/2010  ? HYPERCHOLESTEROLEMIA  IIA 08/27/2009  ? HYPERTENSION, BENIGN 12/11/2008  ? ? ?Orientation RESPIRATION BLADDER Height & Weight   ?  ?Self ? O2 (2L nasal cannula) Incontinent Weight:   ?Height:     ?BEHAVIORAL SYMPTOMS/MOOD NEUROLOGICAL BOWEL NUTRITION STATUS  ?    Continent Diet (See dc summary)  ?AMBULATORY STATUS COMMUNICATION OF NEEDS Skin   ?Extensive Assist Verbally Normal ?  ?  ?  ?    ?     ?     ? ? ?Personal Care Assistance Level of Assistance  ?Bathing, Feeding, Dressing Bathing Assistance: Maximum assistance ?Feeding assistance: Maximum assistance ?Dressing Assistance: Maximum assistance ?   ? ?Functional Limitations Info  ?    ?  ?   ? ? ?SPECIAL CARE FACTORS FREQUENCY  ?    ?  ?  ?  ?  ?  ?  ?   ? ? ?Contractures Contractures Info: Not present  ? ? ?Additional Factors Info  ?Code Status, Allergies Code Status Info: DNR ?Allergies Info: Ambien (Zolpidem Tartrate), Codeine, Codeine Phosphate, Darvocet (Propoxyphene N-acetaminophen), Fish Allergy, Fish-derived Products, Hydrocodone, Other, Promethazine, Promethazine Hcl, Sulfamethoxazole, Vicodin (Hydrocodone-acetaminophen), Amoxicillin, Penicillins ?  ?  ?  ?   ? ?Current Medications (02/01/2022):  This is the current hospital active medication list ?Current Facility-Administered Medications  ?Medication Dose Route Frequency Provider  Last Rate Last Admin  ? (feeding supplement) PROSource Plus liquid 30 mL  30 mL Oral BID BM Thurnell Lose, MD   30 mL at 02/01/22 1218  ? 0.9 %  sodium chloride infusion   Intravenous Continuous Thurnell Lose, MD 75 mL/hr at 02/01/22 0651 New Bag at 02/01/22 0651  ? acetaminophen (TYLENOL) tablet 650 mg  650 mg Oral Q6H PRN Orma Flaming, MD   650 mg at 01/31/22 2016  ? apixaban (ELIQUIS) tablet 2.5 mg  2.5 mg Oral  BID Orma Flaming, MD   2.5 mg at 02/01/22 6438  ? carvedilol (COREG) tablet 3.125 mg  3.125 mg Oral BID WC Thurnell Lose, MD      ? cefTRIAXone (ROCEPHIN) 1 g in sodium chloride 0.9 % 100 mL IVPB  1 g Intravenous Q24H Orma Flaming, MD 200 mL/hr at 02/01/22 1218 1 g at 02/01/22 1218  ? donepezil (ARICEPT) tablet 10 mg  10 mg Oral QHS Orma Flaming, MD   10 mg at 01/31/22 2014  ? ferrous sulfate tablet 324 mg  324 mg Oral Daily Orma Flaming, MD   324 mg at 02/01/22 3818  ? leptospermum manuka honey (MEDIHONEY) paste 1 application.  1 application. Topical Daily Thurnell Lose, MD      ? morphine (PF) 2 MG/ML injection 0.5 mg  0.5 mg Intravenous Q4H PRN Orma Flaming, MD   0.5 mg at 01/31/22 1658  ? nitroGLYCERIN (NITROSTAT) SL tablet 0.4 mg  0.4 mg Sublingual Q5 min PRN Orma Flaming, MD      ? polyethylene glycol (MIRALAX / GLYCOLAX) packet 17 g  17 g Oral Daily Orma Flaming, MD      ? predniSONE (DELTASONE) tablet 1 mg  1 mg Oral Q breakfast Orma Flaming, MD   1 mg at 02/01/22 0957  ? rOPINIRole (REQUIP) tablet 1 mg  1 mg Oral BID Orma Flaming, MD   1 mg at 02/01/22 0955  ? rOPINIRole (REQUIP) tablet 2 mg  2 mg Oral Daily Orma Flaming, MD      ? senna-docusate (Senokot-S) tablet 1 tablet  1 tablet Oral BID Orma Flaming, MD   1 tablet at 02/01/22 4037  ? ? ? ?Discharge Medications: ?Please see discharge summary for a list of discharge medications. ? ?Relevant Imaging Results: ? ?Relevant Lab Results: ? ? ?Additional Information ?SSN: [id number] ? ?Lissa Morales Shahrzad Koble, LCSW ? ? ? ? ?

## 2022-02-01 NOTE — Evaluation (Signed)
Occupational Therapy Evaluation/Discharge ?Patient Details ?Name: [name] [name] ?MRN: [id number] ?DOB: July 03, 1921 ?Today's Date: 02/01/2022 ? ? ?History of Present Illness Pt is a 88 y/o female who presents from SNF with AMS d/t suspected encephalopathy. MRI brain negative for acute changes. PMH: HTN, CAD s/p PCI, hx of PE/DVT, Alzheimer's disease, CKD 3, dysphagia, chronic respiratory failure on 2 L O2.  ? ?Clinical Impression ?  ?PTA, pt is a LTC resident at [facility]. Per SNF staff, pt requires +2 manual assist for transfers OOB and receives ADL assist bed level (including Min A for self feeding at times). Pt presents now at baseline for ADLs/mobility. Pt able to follow one step commands and pleasant throughout session. No further skilled OT services needed at acute level. Defer any therapy needs to SNF rehab staff.  ? ?VSS on 2 L O2  ?   ? ?Recommendations for follow up therapy are one component of a multi-disciplinary discharge planning process, led by the attending physician.  Recommendations may be updated based on patient status, additional functional criteria and insurance authorization.  ? ?Follow Up Recommendations ? Long-term institutional care without follow-up therapy (defer therapy needs to SNF staff)  ?  ?Assistance Recommended at Discharge Frequent or constant Supervision/Assistance  ?Patient can return home with the following Two people to help with walking and/or transfers;Two people to help with bathing/dressing/bathroom;Assistance with feeding ? ?  ?Functional Status Assessment ? Patient has not had a recent decline in their functional status  ?Equipment Recommendations ?    ?  ?Recommendations for Other Services   ? ? ?  ?Precautions / Restrictions Precautions ?Precautions: Fall;Other (comment) ?Precaution Comments: 2 L O2 at baseline ?Restrictions ?Weight Bearing Restrictions: No  ? ?  ? ?Mobility Bed Mobility ?Overal bed mobility: Needs Assistance ?  ?  ?  ?  ?  ?  ?General bed mobility comments:  Assist to reposition in bed due to ill positioning of trunk/neck. pt declined to sit EOB ?  ? ?Transfers ?  ?  ?  ?  ?  ?  ?  ?  ?  ?General transfer comment: deferred; +2 manual transfer at SNF at baseline ?  ? ?  ?Balance   ?  ?  ?  ?  ?  ?  ?  ?  ?  ?  ?  ?  ?  ?  ?  ?  ?  ?  ?   ? ?ADL either performed or assessed with clinical judgement  ? ?ADL Overall ADL's : At baseline ?  ?  ?Grooming: Minimal assistance;Bed level;Wash/dry face;Brushing hair ?Grooming Details (indicate cue type and reason): assist to brush back of hair but able to complete sides/front.Marland Kitchen able to wash face without assist. did initially drop comb but able to locate and re-attempt task ?  ?  ?  ?  ?  ?  ?  ?  ?  ?  ?  ?  ?  ?  ?  ?General ADL Comments: bathing, dressing, toileting completed bed level at SNF per staff. able to demo functional skills for coordinating self feeding tasks with Min A at most (NPO currently)  ? ? ? ?Vision Ability to See in Adequate Light: 1 Impaired ?Patient Visual Report: No change from baseline ?Vision Assessment?: No apparent visual deficits  ?   ?Perception   ?  ?Praxis   ?  ? ?Pertinent Vitals/Pain Pain Assessment ?Pain Assessment: No/denies pain  ? ? ? ?Hand Dominance Right ?  ?Extremity/Trunk Assessment Upper  Extremity Assessment ?Upper Extremity Assessment: RUE deficits/detail;LUE deficits/detail ?RUE Deficits / Details: mild to moderate shoulder ROM impairment; likely baseline arthritic changes; able to complete finger to nose test with increased time ?LUE Deficits / Details: mild to moderate shoulder ROM impairment; likely baseline arthritic changes ?  ?Lower Extremity Assessment ?Lower Extremity Assessment: Defer to PT evaluation ?  ?Cervical / Trunk Assessment ?Cervical / Trunk Assessment: Kyphotic ?  ?Communication Communication ?Communication: HOH ?  ?Cognition Arousal/Alertness: Awake/alert ?Behavior During Therapy: [facility] for tasks assessed/performed ?Overall Cognitive Status: History of cognitive  impairments - at baseline ?  ?  ?  ?  ?  ?  ?  ?  ?  ?  ?  ?  ?  ?  ?  ?  ?General Comments: hx of dementia; unable to report current location, month, etc. does provide some info that is likely correct about PLOF but when asked if she lives at [facility] [employer], pt reports "when it is warm out". able to follow directions, very pleasant ?  ?  ?General Comments  VSS on 2 L O2 ? ?  ?Exercises   ?  ?Shoulder Instructions    ? ? ?Home Living Family/patient expects to be discharged to:: Skilled nursing facility ?  ?  ?  ?  ?  ?  ?  ?  ?  ?  ?  ?  ?  ?  ?  ?  ?Additional Comments: from [place] ?  ? ?  ?Prior Functioning/Environment Prior Level of Function : Needs assist;Patient poor historian/Family not available ?  ?  ?  ?Physical Assist : Mobility (physical);ADLs (physical) ?  ?  ?Mobility Comments: per chart, pt is w/c and bedbound; pt does endorse use of wheelchair but unable to recall how she transfers to wheelchair (lift vs pivot assist). Per SNF staff via phone, pt requires +2 manual assist to transfer to chair ?ADLs Comments: Reports "a little" assist for bathing/dressing; per SNF staff via phone, pt receives bathing/dressing assist bed level. per chart, pt is "Min A for feeding" ?  ? ?  ?  ?OT Problem List:   ?  ?   ?OT Treatment/Interventions:    ?  ?OT Goals(Current goals can be found in the care plan section) Acute Rehab OT Goals ?Patient Stated Goal: appreciative of respositioning assist ?OT Goal Formulation: All assessment and education complete, DC therapy  ?OT Frequency:   ?  ? ?Co-evaluation   ?  ?  ?  ?  ? ?  ?AM-PAC OT "6 Clicks" Daily Activity     ?Outcome Measure Help from another person eating meals?: A Little ?Help from another person taking care of personal grooming?: A Little ?Help from another person toileting, which includes using toliet, bedpan, or urinal?: Total ?Help from another person bathing (including washing, rinsing, drying)?: A Lot ?Help from another person to put on and taking off  regular upper body clothing?: A Lot ?Help from another person to put on and taking off regular lower body clothing?: Total ?6 Click Score: 12 ?  ?End of Session Equipment Utilized During Treatment: Oxygen ?Nurse Communication: Mobility status ? ?Activity Tolerance: Patient tolerated treatment well ?Patient left: in bed;with call bell/phone within reach;with bed alarm set ? ?OT Visit Diagnosis: Muscle weakness (generalized) (M62.81)  ?              ?Time: 5852-7782 ?OT Time Calculation (min): 18 min ?Charges:  OT General Charges ?$OT Visit: 1 Visit ?OT Evaluation ?$OT Eval Low Complexity:  1 Low ? ?Malachy Chamber, OTR/L ?Acute Rehab Services ?Office: [phone]  ? ?Layla Maw ?02/01/2022, 8:17 AM ?

## 2022-02-01 NOTE — Progress Notes (Incomplete)
Initial Nutrition Assessment ? ?DOCUMENTATION CODES:  ? ?  ? ?INTERVENTION:  ? ?No new weight this admission. RN notified.  ?Ensure Enlive po BID, each supplement provides 350 kcal and 20 grams of protein. ? ? ? ?NUTRITION DIAGNOSIS:  ? ?  related to   as evidenced by  . ? ?GOAL:  ? ?  ? ?MONITOR:  ? ?  ? ?REASON FOR ASSESSMENT:  ? ?Consult ?Assessment of nutrition requirement/status, Poor PO, Wound healing ? ?ASSESSMENT:  ? ?88 y.o. female presented to the ED with altered mental status. PMH includes Alzheimer's, CKD III, HTN, dysphagia, and GERD. Pt admitted with acute encephalopathy, remote R Thalamic CVA, and L great toe ulcer.  ? ?Medications reviewed and include: Ferrous Sulfate, Miralax, Prednisone, Senokot, IV antibiotics  ?Labs reviewed: BUN 24, Creatinine 1.19, CRP 4.2 ? ?NUTRITION - FOCUSED PHYSICAL EXAM: ? ?{RD Focused Exam List:21252} ? ?Diet Order:   ?Diet Order   ? ?       ?  DIET SOFT Room service appropriate? Yes; Fluid consistency: Thin  Diet effective now       ?  ? ?  ?  ? ?  ? ? ?EDUCATION NEEDS:  ? ?  ? ?Skin:  Skin Assessment: Reviewed RN Assessment ? ?Last BM:  PTA ? ?Height:  ? ?Ht Readings from Last 1 Encounters:  ?01/15/21 '5\' 1"'$  (1.549 m)  ? ? ?Weight:  ? ?Wt Readings from Last 1 Encounters:  ?01/15/21 60 kg  ? ? ?Ideal Body Weight:  47.7 kg ? ?BMI:  There is no height or weight on file to calculate BMI. ? ?Estimated Nutritional Needs:  ? ?Kcal:    ? ?Protein:    ? ?Fluid:    ? ? ? ?Hermina Barters RD, LDN ?Clinical Dietitian ?See AMiON for contact information.  ? ?

## 2022-02-01 NOTE — Care Management Obs Status (Signed)
MEDICARE OBSERVATION STATUS NOTIFICATION ? ? ?Patient Details  ?Name: [name] [name] ?MRN: [id number] ?Date of Birth: 1921/07/14 ? ? ?Medicare Observation Status Notification Given:  Yes ? ? ? ?Cyndi Bender, RN ?02/01/2022, 2:20 PM ?

## 2022-02-01 NOTE — Consult Note (Addendum)
WOC Nurse Consult Note: ?Reason for Consult: Consult requested for left great toe.  Performed remotely after review of progress notes and photos in the EMR.  ?Wound type: Left plantar great toe with chronic full thickness wound; 100% loose yellow slough.  Generalized edema and erythremia surrounding. X-ray did not indicate osteomyelitis.  ?Dressing procedure/placement/frequency: Topical treatment orders provided for bedside nurses to perform as follows to assist with removal of nonviable tissue: Apply Medihoney to left great toe wound Q day, then cover with foam dressing.  (Change foam dressing Q 3 days or PRN soiling.) ?Please re-consult if further assistance is needed.  Thank-you,  ?Julien Girt MSN, RN, [place], [place], CNS ?[phone]  ? ?  ?

## 2022-02-02 DIAGNOSIS — L97529 Non-pressure chronic ulcer of other part of left foot with unspecified severity: Secondary | ICD-10-CM | POA: Diagnosis not present

## 2022-02-02 DIAGNOSIS — Z20822 Contact with and (suspected) exposure to covid-19: Secondary | ICD-10-CM | POA: Diagnosis not present

## 2022-02-02 DIAGNOSIS — N3000 Acute cystitis without hematuria: Secondary | ICD-10-CM | POA: Diagnosis not present

## 2022-02-02 DIAGNOSIS — G9340 Encephalopathy, unspecified: Secondary | ICD-10-CM | POA: Diagnosis not present

## 2022-02-02 LAB — COMPREHENSIVE METABOLIC PANEL
ALT: 11 U/L (ref 0–44)
AST: 18 U/L (ref 15–41)
Albumin: 2 g/dL — ABNORMAL LOW (ref 3.5–5.0)
Alkaline Phosphatase: 49 U/L (ref 38–126)
Anion gap: 8 (ref 5–15)
BUN: 20 mg/dL (ref 8–23)
CO2: 15 mmol/L — ABNORMAL LOW (ref 22–32)
Calcium: 8 mg/dL — ABNORMAL LOW (ref 8.9–10.3)
Chloride: 120 mmol/L — ABNORMAL HIGH (ref 98–111)
Creatinine, Ser: 0.99 mg/dL (ref 0.44–1.00)
GFR, Estimated: 52 mL/min — ABNORMAL LOW (ref >60)
Glucose, Bld: 90 mg/dL (ref 70–99)
Potassium: 3.9 mmol/L (ref 3.5–5.1)
Sodium: 143 mmol/L (ref 135–145)
Total Bilirubin: 0.4 mg/dL (ref 0.3–1.2)
Total Protein: 4.8 g/dL — ABNORMAL LOW (ref 6.5–8.1)

## 2022-02-02 LAB — CBC WITH DIFFERENTIAL/PLATELET
Abs Immature Granulocytes: 0.06 10*3/uL (ref 0.00–0.07)
Basophils Absolute: 0.1 10*3/uL (ref 0.0–0.1)
Basophils Relative: 1 %
Eosinophils Absolute: 0.3 10*3/uL (ref 0.0–0.5)
Eosinophils Relative: 4 %
HCT: 31.1 % — ABNORMAL LOW (ref 36.0–46.0)
Hemoglobin: 9.6 g/dL — ABNORMAL LOW (ref 12.0–15.0)
Immature Granulocytes: 1 %
Lymphocytes Relative: 20 %
Lymphs Abs: 1.7 10*3/uL (ref 0.7–4.0)
MCH: 26.7 pg (ref 26.0–34.0)
MCHC: 30.9 g/dL (ref 30.0–36.0)
MCV: 86.6 fL (ref 80.0–100.0)
Monocytes Absolute: 0.4 10*3/uL (ref 0.1–1.0)
Monocytes Relative: 5 %
Neutro Abs: 5.8 10*3/uL (ref 1.7–7.7)
Neutrophils Relative %: 69 %
Platelets: 381 10*3/uL (ref 150–400)
RBC: 3.59 MIL/uL — ABNORMAL LOW (ref 3.87–5.11)
RDW: 17.2 % — ABNORMAL HIGH (ref 11.5–15.5)
WBC: 8.3 10*3/uL (ref 4.0–10.5)
nRBC: 0 % (ref 0.0–0.2)

## 2022-02-02 LAB — URINE CULTURE: Culture: >=100000 — AB

## 2022-02-02 LAB — C-REACTIVE PROTEIN: CRP: 5.1 mg/dL — ABNORMAL HIGH (ref <1.0)

## 2022-02-02 LAB — MAGNESIUM: Magnesium: 1.9 mg/dL (ref 1.7–2.4)

## 2022-02-02 LAB — BRAIN NATRIURETIC PEPTIDE: B Natriuretic Peptide: 260.8 pg/mL — ABNORMAL HIGH (ref 0.0–100.0)

## 2022-02-02 MED ORDER — AMLODIPINE BESYLATE 5 MG PO TABS
5.0000 mg | ORAL_TABLET | Freq: Every day | ORAL | Status: DC
  Administered 2022-02-02: 5 mg via ORAL
  Filled 2022-02-02: qty 1

## 2022-02-02 MED ORDER — FOSFOMYCIN TROMETHAMINE 3 G PO PACK
3.0000 g | PACK | Freq: Once | ORAL | Status: AC
  Administered 2022-02-02: 3 g via ORAL
  Filled 2022-02-02: qty 3

## 2022-02-02 MED ORDER — GLUCAGON HCL RDNA (DIAGNOSTIC) 1 MG IJ SOLR
1.0000 mg | Freq: Once | INTRAMUSCULAR | Status: AC
  Administered 2022-02-02: 1 mg via INTRAVENOUS
  Filled 2022-02-02: qty 1

## 2022-02-02 NOTE — Progress Notes (Signed)
Speech Language Pathology Treatment: Dysphagia  ?Patient Details ?Name: [name] [name] ?MRN: [id number] ?DOB: [birth year] ?Today's Date: 02/02/2022 ?Time: 1225-8346 ?SLP Time Calculation (min) (ACUTE ONLY): 9 min ? ?Assessment / Plan / Recommendation ?Clinical Impression ? Pt has prolonged mastication that is suspected to be near baseline given missing dentition. Pt also makes some compensations with Mod I, automatically taking the solid to the R side of her mouth and letting it hang slightly out of her mouth and taking in only what she is actively masticating, although ultimately consuming the whole piece. No overt s/s of aspiration are observed and she has no significant oral residue. Although her oral preparation is slow, it appears to be functional. Will leave on current Dys 3 diet and thin liquids. SLP to sign off acutely, noting plans for discharge.  ?  ?HPI HPI: Pt is a 88 yo female presenting from SNF with AMS d/t suspected encephalopathy. MRI revealed no acute changes but with R thalamic lacunar infarct that is new compared to MRI from 04/29/20. Swallow eval 10/07/12 concerning for primary esophageal dysphagia, improved after EGD. that admission which found stricture that was stretched. Additional eval 02/29/16 WNL but with pt report of occasional globus sensation. PMH: HTN, CAD s/p PCI, hx of PE/DVT, Alzheimer's disease, CKD 3, dysphagia, chronic respiratory failure on 2 L O2. ?  ?   ?SLP Plan ? All goals met ? ?  ?  ?Recommendations for follow up therapy are one component of a multi-disciplinary discharge planning process, led by the attending physician.  Recommendations may be updated based on patient status, additional functional criteria and insurance authorization. ?  ? ?Recommendations  ?Diet recommendations: Dysphagia 3 (mechanical soft);Thin liquid ?Liquids provided via: Cup;Straw ?Medication Administration: Whole meds with puree ?Supervision: Staff to assist with self feeding ?Compensations: Slow  rate;Small sips/bites;Follow solids with liquid ?Postural Changes and/or Swallow Maneuvers: Seated upright 90 degrees;Upright 30-60 min after meal  ?   ?    ?   ? ? ? ? Oral Care Recommendations: Oral care BID ?Follow Up Recommendations: Skilled nursing-short term rehab (<3 hours/day) ?Assistance recommended at discharge: Frequent or constant Supervision/Assistance ?SLP Visit Diagnosis: Dysphagia, unspecified (R13.10) ?Plan: All goals met ? ? ? ? ?  ?  ? ? ?Osie Bond., M.A. CCC-SLP ?Acute Rehabilitation Services ?Pager [phone] ?Office [phone] ? ? ?02/02/2022, 10:59 AM ?

## 2022-02-02 NOTE — Plan of Care (Signed)

## 2022-02-02 NOTE — TOC Transition Note (Signed)
Transition of Care (TOC) - CM/SW Discharge Note ? ? ?Patient Details  ?Name: [name] [name] ?MRN: [id number] ?Date of Birth: 1921/08/10 ? ?Transition of Care (TOC) CM/SW Contact:  ?Benard Halsted, LCSW ?Phone Number: ?02/02/2022, 10:53 AM ? ? ?Clinical Narrative:    ?Patient will DC to: [facility] ?Anticipated DC date: 02/02/22 ?Family notified: Son, [name] ?Transport by: Ocie Cornfield 1pm ? ? ?Per MD patient ready for DC to [facility]. RN to call report prior to discharge [phone]). RN, patient, patient's family, and facility notified of DC. Discharge Summary and FL2 sent to facility. DC packet on chart. Ambulance transport requested for patient.  ? ?CSW will sign off for now as social work intervention is no longer needed. Please consult [place] again if new needs arise. ? ? ? ? ?Final next level of care: [place] ?Barriers to Discharge: No Barriers Identified ? ? ?Patient Goals and CMS Choice ?Patient states their goals for this hospitalization and ongoing recovery are:: Return to SNF ?CMS Medicare.gov Compare Post Acute Care list provided to:: Patient Represenative (must comment) ?Choice offered to / list presented to : Adult Children ? ?Discharge Placement ?  ?Existing PASRR number confirmed : 02/02/22          ?Patient chooses bed at: [facility] ?Patient to be transferred to facility by: [facility] ?Name of family member notified: Son ?Patient and family notified of of transfer: 02/02/22 ? ?Discharge Plan and Services ?In-house Referral: Clinical Social Work ?  ?Post Acute Care Choice: [place]          ?  ?  ?  ?  ?  ?  ?  ?  ?  ?  ? ?Social Determinants of Health (SDOH) Interventions ?  ? ? ?Readmission Risk Interventions ?   ? View : No data to display.  ?  ?  ?  ? ? ? ? ? ?

## 2022-02-02 NOTE — Progress Notes (Signed)
Pt HR consistently bradycardic this shift. Pt ranges from 30s to low 50s, VSS otherwise, no signs of distress, pt confused but alert, and denies any pain or discomfort at this time. MD notified, EKG ordered, central monitoring also notified nursing of bradycardia trends. Will monitor closely  ?

## 2022-02-02 NOTE — Discharge Summary (Signed)
?                                                                                ? ?[name] [name] MWN:[id number] DOB: 1921-03-03 DOA: 01/31/2022 ? ?PCP: Elmore Guise, MD ? ?Admit date: 01/31/2022  Discharge date: 02/02/2022 ? ?Admitted From: SNF   Disposition:  SNF ? ? ?Recommendations for Outpatient Follow-up:  ? ?Follow up with PCP in 1-2 weeks ? ?PCP Please obtain BMP/CBC, 2 view CXR in 1week,  (see Discharge instructions)  ? ?PCP Please follow up on the following pending results:  ? ? ?Home Health: None   ?Equipment/Devices: None  ?Consultations: None  ?Discharge Condition: Stable    ?CODE STATUS: Full    ?Diet Recommendation:  ? ?Diet Order   ? ?       ?  DIET DYS 2 Room service appropriate? Yes; Fluid consistency: Thin  Diet effective now       ?  ? ?  ?  ? ?  ?  ? ?Chief Complaint  ?Patient presents with  ? Altered Mental Status  ?  ? ?Brief history of present illness from the day of admission and additional interim summary   ? ?88 y.o. female with medical history significant of HTN, CAD s/p PCI, hx of PE/DVT on eliquis, alzheimer's disease, CKD stage III, depression and anxiety, dysphagia, PMR, chronic respiratory failure on 2L oxygen,  and FTT who presented to ED with complaints of AMS ? ?                                                               Hospital Course  ? ?Acute Toxic Encephalopathy - 88 year old female presenting with altered mental status due to ESBL UTI and dehydration, CT and MRI show a subacute stroke but not acute, she is largely bedbound.  He was treated with IV fluids received antibiotics, will be getting fosfomycin today, clinically back to baseline.  Had stable TSH, B12 levels. ?  ?Remote to subacute right thalamic CVA -  No overt neurological deficits on exam; she is bedbound at baseline, already on Eliquis which will be continued, no new change. ?  ?Chronic ulcer of great toe of left foot  ([place]) - Has had an ulcer on plantar aspect of great left toe for > year per son.  Stable x-ray, wound care to be continued at [facility].  If wound is not better outpatient orthopedic follow-up for debridement in the outpatient setting can be considered. ?  ?CKD (chronic kidney disease), stage III (HCC) stable. ?  ?Dementia ([place]) - Baseline but conversant, improving, continue home dose Aricept and supportive care, monitor heart rate if bradycardia becomes an issue discontinue Aricept. ?  ?Chronic respiratory failure ([place]) - On 2L oxygen, at her baseline, monitor . ?  ?CAD (coronary artery disease)  - No complaints of chest pain, stable EKG no acute issue .  That non-ACS pattern troponin rise which is minimal.  Continue Eliquis. ?  ?  Anxiety - Stable on her xanax daily.  ?  ?HYPERTENSION, BENIGN - Soft readings, hold home medication of losartan, norvasc.  Currently stable on home regimen. ?  ?PMR (polymyalgia rheumatica) (HCC) - Continue daily low dose steroid and tramadol. ?  ?HX of PE/VTE - Continue eliquis ?  ?Moderate protein malnutrition ([place]) - received oral protein supplements here. ?  ?Normocytic anemia - Baseline hgb appears to be around 8-9, on oral iron, outpatient anemia age-appropriate work-up directed by PCP. ?  ?Sinus bradycardia after receiving 2 doses of Coreg beta-blocker discontinued sinus bradycardia is improved, stable blood pressures, will also give a dose of glucagon prior to discharge in case some beta-blocker is in the system, if bradycardia stays to be an issue please discontinue Aricept.  She had stable TSH. ? ? ?Discharge diagnosis   ? ? ?Principal Problem: ?  Encephalopathy acute ?Active Problems: ?  remote right thalamic CVA ?  Chronic ulcer of great toe of left foot ([place]) ?  CKD (chronic kidney disease), stage III ([place]) ?  Dementia ([place]) ?  Chronic respiratory failure ([place]) ?  CAD (coronary artery disease) ?  Anxiety ?  HYPERTENSION, BENIGN ?  PMR (polymyalgia rheumatica) (HCC) ?  hx of  PE/VTE ?  Moderate protein malnutrition ([place]) ?  Acute lower UTI ?  Normocytic anemia ? ? ? ?Discharge instructions   ? ?Discharge Instructions   ? ? Discharge instructions   Complete by: As directed ?  ? Follow with Primary MD Sherril Croon Satira Anis, MD in 7 days  ? ?Get CBC, CMP, 2 view Chest X ray -  checked next visit within 1 week by Primary MD  ? ?Activity: As tolerated with Full fall precautions use walker/cane & assistance as needed ? ?Disposition Home  ? ?Diet: Dysphagia 2 with feeding assistance and aspiration precautions. ?  ?Special Instructions: If you have smoked or chewed Tobacco  in the last 2 yrs please stop smoking, stop any regular Alcohol  and or any Recreational drug use. ? ?On your next visit with your primary care physician please Get Medicines reviewed and adjusted. ? ?Please request your Prim.MD to go over all Hospital Tests and Procedure/Radiological results at the follow up, please get all Hospital records sent to your Prim MD by signing hospital release before you go home. ? ?If you experience worsening of your admission symptoms, develop shortness of breath, life threatening emergency, suicidal or homicidal thoughts you must seek medical attention immediately by calling 911 or calling your MD immediately  if symptoms less severe. ? ?You Must read complete instructions/literature along with all the possible adverse reactions/side effects for all the Medicines you take and that have been prescribed to you. Take any new Medicines after you have completely understood and accpet all the possible adverse reactions/side effects.  ? Discharge wound care:   Complete by: As directed ?  ? Medihoney to left great toe wound Q day, then cover with foam dressing.  (Change foam dressing Q 3 days or PRN soiling.)  ? Increase activity slowly   Complete by: As directed ?  ? ?  ? ? ?Discharge Medications  ? ?Allergies as of 02/02/2022   ? ?   Reactions  ? Ambien [zolpidem Tartrate] Nausea And Vomiting  ? Codeine    ? Codeine Phosphate Nausea And Vomiting  ? Darvocet [propoxyphene N-acetaminophen] Nausea And Vomiting  ? Fish Allergy   ? Fish-derived Products   ? Hydrocodone   ? Other   ? Chocolate Flavor.   ?  Promethazine   ? Promethazine Hcl Nausea And Vomiting  ? Sulfamethoxazole Other (See Comments)  ? Pt doesn't remember reaction  ? Vicodin [hydrocodone-acetaminophen] Other (See Comments)  ? Unknown reaction  ? Amoxicillin Nausea And Vomiting, Rash  ? Penicillins Nausea And Vomiting, Rash  ? ?  ? ?  ?Medication List  ?  ? ?TAKE these medications   ? ?acetaminophen 325 MG tablet ?Commonly known as: TYLENOL ?Take 650 mg by mouth every 6 (six) hours as needed for mild pain, moderate pain or fever. ?  ?albuterol 108 (90 Base) MCG/ACT inhaler ?Commonly known as: VENTOLIN HFA ?Inhale 2 puffs into the lungs every 6 (six) hours as needed for wheezing or shortness of breath. ?  ?amLODipine 10 MG tablet ?Commonly known as: NORVASC ?Take 10 mg by mouth every morning. ?  ?apixaban 2.5 MG Tabs tablet ?Commonly known as: ELIQUIS ?Take 2.5 mg by mouth 2 (two) times daily. ?  ?donepezil 10 MG tablet ?Commonly known as: ARICEPT ?Take 10 mg by mouth at bedtime. ?  ?ergocalciferol 1.25 MG (50000 UT) capsule ?Commonly known as: VITAMIN D2 ?Take 50,000 Units by mouth once a week. ?  ?ferrous sulfate 324 MG Tbec ?Take 324 mg by mouth daily. ?  ?losartan 50 MG tablet ?Commonly known as: COZAAR ?Take 50 mg by mouth at bedtime. ?  ?nitroGLYCERIN 0.4 MG SL tablet ?Commonly known as: NITROSTAT ?Place 0.4 mg under the tongue every 5 (five) minutes as needed for chest pain. ?  ?NON FORMULARY ?Diet Order: Mech Soft Liberalized Diet with thin liquids no therapeutic restrictions. ?  ?NUTRITIONAL SUPPLEMENT PO ?Take 1 Container by mouth 2 (two) times a day. Magic Cup - take with lunch and dinner ?  ?OXYGEN ?Inhale 2 L/min into the lungs as needed. Wean as able to keep SPO2 greater than 92% ?  ?polyethylene glycol 17 g packet ?Commonly known as: MIRALAX /  GLYCOLAX ?Take 17 g by mouth daily. ?  ?predniSONE 1 MG tablet ?Commonly known as: DELTASONE ?Take 1 mg by mouth daily with breakfast. ?  ?rOPINIRole 2 MG tablet ?Commonly known as: REQUIP ?Take 2 mg by mou

## 2022-02-02 NOTE — Discharge Instructions (Signed)
Follow with Primary MD Sherril Croon Satira Anis, MD in 7 days  ? ?Get CBC, CMP, 2 view Chest X ray -  checked next visit within 1 week by Primary MD  ? ?Activity: As tolerated with Full fall precautions use walker/cane & assistance as needed ? ?Disposition Home  ? ?Diet: Dysphagia 2 with feeding assistance and aspiration precautions. ?  ?Special Instructions: If you have smoked or chewed Tobacco  in the last 2 yrs please stop smoking, stop any regular Alcohol  and or any Recreational drug use. ? ?On your next visit with your primary care physician please Get Medicines reviewed and adjusted. ? ?Please request your Prim.MD to go over all Hospital Tests and Procedure/Radiological results at the follow up, please get all Hospital records sent to your Prim MD by signing hospital release before you go home. ? ?If you experience worsening of your admission symptoms, develop shortness of breath, life threatening emergency, suicidal or homicidal thoughts you must seek medical attention immediately by calling 911 or calling your MD immediately  if symptoms less severe. ? ?You Must read complete instructions/literature along with all the possible adverse reactions/side effects for all the Medicines you take and that have been prescribed to you. Take any new Medicines after you have completely understood and accpet all the possible adverse reactions/side effects.  ? ?  ?

## 2022-02-08 DIAGNOSIS — R059 Cough, unspecified: Secondary | ICD-10-CM | POA: Diagnosis not present

## 2022-02-08 DIAGNOSIS — Z8701 Personal history of pneumonia (recurrent): Secondary | ICD-10-CM | POA: Diagnosis not present

## 2022-02-15 DIAGNOSIS — M6281 Muscle weakness (generalized): Secondary | ICD-10-CM | POA: Diagnosis not present

## 2022-02-15 DIAGNOSIS — I129 Hypertensive chronic kidney disease with stage 1 through stage 4 chronic kidney disease, or unspecified chronic kidney disease: Secondary | ICD-10-CM | POA: Diagnosis not present

## 2022-02-15 DIAGNOSIS — R1312 Dysphagia, oropharyngeal phase: Secondary | ICD-10-CM | POA: Diagnosis not present

## 2022-02-15 DIAGNOSIS — J449 Chronic obstructive pulmonary disease, unspecified: Secondary | ICD-10-CM | POA: Diagnosis not present

## 2022-02-15 DIAGNOSIS — N183 Chronic kidney disease, stage 3 unspecified: Secondary | ICD-10-CM | POA: Diagnosis not present

## 2022-02-16 DIAGNOSIS — M6281 Muscle weakness (generalized): Secondary | ICD-10-CM | POA: Diagnosis not present

## 2022-02-16 DIAGNOSIS — J449 Chronic obstructive pulmonary disease, unspecified: Secondary | ICD-10-CM | POA: Diagnosis not present

## 2022-02-16 DIAGNOSIS — N183 Chronic kidney disease, stage 3 unspecified: Secondary | ICD-10-CM | POA: Diagnosis not present

## 2022-02-16 DIAGNOSIS — I129 Hypertensive chronic kidney disease with stage 1 through stage 4 chronic kidney disease, or unspecified chronic kidney disease: Secondary | ICD-10-CM | POA: Diagnosis not present

## 2022-02-16 DIAGNOSIS — R1312 Dysphagia, oropharyngeal phase: Secondary | ICD-10-CM | POA: Diagnosis not present

## 2022-02-17 DIAGNOSIS — R1312 Dysphagia, oropharyngeal phase: Secondary | ICD-10-CM | POA: Diagnosis not present

## 2022-02-17 DIAGNOSIS — J449 Chronic obstructive pulmonary disease, unspecified: Secondary | ICD-10-CM | POA: Diagnosis not present

## 2022-02-17 DIAGNOSIS — M6281 Muscle weakness (generalized): Secondary | ICD-10-CM | POA: Diagnosis not present

## 2022-02-17 DIAGNOSIS — N183 Chronic kidney disease, stage 3 unspecified: Secondary | ICD-10-CM | POA: Diagnosis not present

## 2022-02-17 DIAGNOSIS — I129 Hypertensive chronic kidney disease with stage 1 through stage 4 chronic kidney disease, or unspecified chronic kidney disease: Secondary | ICD-10-CM | POA: Diagnosis not present

## 2022-02-18 DIAGNOSIS — M6281 Muscle weakness (generalized): Secondary | ICD-10-CM | POA: Diagnosis not present

## 2022-02-18 DIAGNOSIS — N183 Chronic kidney disease, stage 3 unspecified: Secondary | ICD-10-CM | POA: Diagnosis not present

## 2022-02-18 DIAGNOSIS — I129 Hypertensive chronic kidney disease with stage 1 through stage 4 chronic kidney disease, or unspecified chronic kidney disease: Secondary | ICD-10-CM | POA: Diagnosis not present

## 2022-02-18 DIAGNOSIS — R1312 Dysphagia, oropharyngeal phase: Secondary | ICD-10-CM | POA: Diagnosis not present

## 2022-02-18 DIAGNOSIS — J449 Chronic obstructive pulmonary disease, unspecified: Secondary | ICD-10-CM | POA: Diagnosis not present

## 2022-02-19 DIAGNOSIS — J449 Chronic obstructive pulmonary disease, unspecified: Secondary | ICD-10-CM | POA: Diagnosis not present

## 2022-02-19 DIAGNOSIS — N183 Chronic kidney disease, stage 3 unspecified: Secondary | ICD-10-CM | POA: Diagnosis not present

## 2022-02-19 DIAGNOSIS — R1312 Dysphagia, oropharyngeal phase: Secondary | ICD-10-CM | POA: Diagnosis not present

## 2022-02-19 DIAGNOSIS — I129 Hypertensive chronic kidney disease with stage 1 through stage 4 chronic kidney disease, or unspecified chronic kidney disease: Secondary | ICD-10-CM | POA: Diagnosis not present

## 2022-02-19 DIAGNOSIS — M6281 Muscle weakness (generalized): Secondary | ICD-10-CM | POA: Diagnosis not present

## 2022-02-22 DIAGNOSIS — M6281 Muscle weakness (generalized): Secondary | ICD-10-CM | POA: Diagnosis not present

## 2022-02-22 DIAGNOSIS — R1312 Dysphagia, oropharyngeal phase: Secondary | ICD-10-CM | POA: Diagnosis not present

## 2022-02-22 DIAGNOSIS — J449 Chronic obstructive pulmonary disease, unspecified: Secondary | ICD-10-CM | POA: Diagnosis not present

## 2022-02-22 DIAGNOSIS — I129 Hypertensive chronic kidney disease with stage 1 through stage 4 chronic kidney disease, or unspecified chronic kidney disease: Secondary | ICD-10-CM | POA: Diagnosis not present

## 2022-02-22 DIAGNOSIS — N183 Chronic kidney disease, stage 3 unspecified: Secondary | ICD-10-CM | POA: Diagnosis not present

## 2022-02-23 DIAGNOSIS — F32A Depression, unspecified: Secondary | ICD-10-CM | POA: Diagnosis not present

## 2022-02-23 DIAGNOSIS — J449 Chronic obstructive pulmonary disease, unspecified: Secondary | ICD-10-CM | POA: Diagnosis not present

## 2022-02-23 DIAGNOSIS — I129 Hypertensive chronic kidney disease with stage 1 through stage 4 chronic kidney disease, or unspecified chronic kidney disease: Secondary | ICD-10-CM | POA: Diagnosis not present

## 2022-02-23 DIAGNOSIS — R1312 Dysphagia, oropharyngeal phase: Secondary | ICD-10-CM | POA: Diagnosis not present

## 2022-02-23 DIAGNOSIS — M6281 Muscle weakness (generalized): Secondary | ICD-10-CM | POA: Diagnosis not present

## 2022-02-23 DIAGNOSIS — F5101 Primary insomnia: Secondary | ICD-10-CM | POA: Diagnosis not present

## 2022-02-23 DIAGNOSIS — N183 Chronic kidney disease, stage 3 unspecified: Secondary | ICD-10-CM | POA: Diagnosis not present

## 2022-02-24 DIAGNOSIS — J449 Chronic obstructive pulmonary disease, unspecified: Secondary | ICD-10-CM | POA: Diagnosis not present

## 2022-02-24 DIAGNOSIS — N183 Chronic kidney disease, stage 3 unspecified: Secondary | ICD-10-CM | POA: Diagnosis not present

## 2022-02-24 DIAGNOSIS — R1312 Dysphagia, oropharyngeal phase: Secondary | ICD-10-CM | POA: Diagnosis not present

## 2022-02-24 DIAGNOSIS — M6281 Muscle weakness (generalized): Secondary | ICD-10-CM | POA: Diagnosis not present

## 2022-02-24 DIAGNOSIS — I129 Hypertensive chronic kidney disease with stage 1 through stage 4 chronic kidney disease, or unspecified chronic kidney disease: Secondary | ICD-10-CM | POA: Diagnosis not present

## 2022-02-25 DIAGNOSIS — R1312 Dysphagia, oropharyngeal phase: Secondary | ICD-10-CM | POA: Diagnosis not present

## 2022-02-25 DIAGNOSIS — I129 Hypertensive chronic kidney disease with stage 1 through stage 4 chronic kidney disease, or unspecified chronic kidney disease: Secondary | ICD-10-CM | POA: Diagnosis not present

## 2022-02-25 DIAGNOSIS — J449 Chronic obstructive pulmonary disease, unspecified: Secondary | ICD-10-CM | POA: Diagnosis not present

## 2022-02-25 DIAGNOSIS — N183 Chronic kidney disease, stage 3 unspecified: Secondary | ICD-10-CM | POA: Diagnosis not present

## 2022-02-25 DIAGNOSIS — M6281 Muscle weakness (generalized): Secondary | ICD-10-CM | POA: Diagnosis not present

## 2022-02-26 DIAGNOSIS — I129 Hypertensive chronic kidney disease with stage 1 through stage 4 chronic kidney disease, or unspecified chronic kidney disease: Secondary | ICD-10-CM | POA: Diagnosis not present

## 2022-02-26 DIAGNOSIS — R1312 Dysphagia, oropharyngeal phase: Secondary | ICD-10-CM | POA: Diagnosis not present

## 2022-02-26 DIAGNOSIS — J449 Chronic obstructive pulmonary disease, unspecified: Secondary | ICD-10-CM | POA: Diagnosis not present

## 2022-02-26 DIAGNOSIS — M6281 Muscle weakness (generalized): Secondary | ICD-10-CM | POA: Diagnosis not present

## 2022-02-26 DIAGNOSIS — N183 Chronic kidney disease, stage 3 unspecified: Secondary | ICD-10-CM | POA: Diagnosis not present

## 2022-02-28 DIAGNOSIS — F339 Major depressive disorder, recurrent, unspecified: Secondary | ICD-10-CM | POA: Diagnosis not present

## 2022-02-28 DIAGNOSIS — F02B18 Dementia in other diseases classified elsewhere, moderate, with other behavioral disturbance: Secondary | ICD-10-CM | POA: Diagnosis not present

## 2022-02-28 DIAGNOSIS — G301 Alzheimer's disease with late onset: Secondary | ICD-10-CM | POA: Diagnosis not present

## 2022-02-28 DIAGNOSIS — F5101 Primary insomnia: Secondary | ICD-10-CM | POA: Diagnosis not present

## 2022-03-01 DIAGNOSIS — J449 Chronic obstructive pulmonary disease, unspecified: Secondary | ICD-10-CM | POA: Diagnosis not present

## 2022-03-01 DIAGNOSIS — N183 Chronic kidney disease, stage 3 unspecified: Secondary | ICD-10-CM | POA: Diagnosis not present

## 2022-03-01 DIAGNOSIS — M6281 Muscle weakness (generalized): Secondary | ICD-10-CM | POA: Diagnosis not present

## 2022-03-01 DIAGNOSIS — I129 Hypertensive chronic kidney disease with stage 1 through stage 4 chronic kidney disease, or unspecified chronic kidney disease: Secondary | ICD-10-CM | POA: Diagnosis not present

## 2022-03-02 DIAGNOSIS — J449 Chronic obstructive pulmonary disease, unspecified: Secondary | ICD-10-CM | POA: Diagnosis not present

## 2022-03-02 DIAGNOSIS — I129 Hypertensive chronic kidney disease with stage 1 through stage 4 chronic kidney disease, or unspecified chronic kidney disease: Secondary | ICD-10-CM | POA: Diagnosis not present

## 2022-03-02 DIAGNOSIS — M6281 Muscle weakness (generalized): Secondary | ICD-10-CM | POA: Diagnosis not present

## 2022-03-02 DIAGNOSIS — N183 Chronic kidney disease, stage 3 unspecified: Secondary | ICD-10-CM | POA: Diagnosis not present

## 2022-03-03 DIAGNOSIS — I129 Hypertensive chronic kidney disease with stage 1 through stage 4 chronic kidney disease, or unspecified chronic kidney disease: Secondary | ICD-10-CM | POA: Diagnosis not present

## 2022-03-03 DIAGNOSIS — N183 Chronic kidney disease, stage 3 unspecified: Secondary | ICD-10-CM | POA: Diagnosis not present

## 2022-03-03 DIAGNOSIS — M6281 Muscle weakness (generalized): Secondary | ICD-10-CM | POA: Diagnosis not present

## 2022-03-03 DIAGNOSIS — J449 Chronic obstructive pulmonary disease, unspecified: Secondary | ICD-10-CM | POA: Diagnosis not present

## 2022-03-04 DIAGNOSIS — N183 Chronic kidney disease, stage 3 unspecified: Secondary | ICD-10-CM | POA: Diagnosis not present

## 2022-03-04 DIAGNOSIS — J449 Chronic obstructive pulmonary disease, unspecified: Secondary | ICD-10-CM | POA: Diagnosis not present

## 2022-03-04 DIAGNOSIS — M6281 Muscle weakness (generalized): Secondary | ICD-10-CM | POA: Diagnosis not present

## 2022-03-04 DIAGNOSIS — I129 Hypertensive chronic kidney disease with stage 1 through stage 4 chronic kidney disease, or unspecified chronic kidney disease: Secondary | ICD-10-CM | POA: Diagnosis not present

## 2022-03-23 DIAGNOSIS — F32A Depression, unspecified: Secondary | ICD-10-CM | POA: Diagnosis not present

## 2022-03-23 DIAGNOSIS — F5101 Primary insomnia: Secondary | ICD-10-CM | POA: Diagnosis not present

## 2022-03-25 DIAGNOSIS — G301 Alzheimer's disease with late onset: Secondary | ICD-10-CM | POA: Diagnosis not present

## 2022-03-25 DIAGNOSIS — F5101 Primary insomnia: Secondary | ICD-10-CM | POA: Diagnosis not present

## 2022-03-25 DIAGNOSIS — F339 Major depressive disorder, recurrent, unspecified: Secondary | ICD-10-CM | POA: Diagnosis not present

## 2022-03-25 DIAGNOSIS — F02B18 Dementia in other diseases classified elsewhere, moderate, with other behavioral disturbance: Secondary | ICD-10-CM | POA: Diagnosis not present

## 2022-04-06 DIAGNOSIS — F32A Depression, unspecified: Secondary | ICD-10-CM | POA: Diagnosis not present

## 2022-04-06 DIAGNOSIS — F5101 Primary insomnia: Secondary | ICD-10-CM | POA: Diagnosis not present

## 2022-04-13 DIAGNOSIS — F5101 Primary insomnia: Secondary | ICD-10-CM | POA: Diagnosis not present

## 2022-04-13 DIAGNOSIS — F32A Depression, unspecified: Secondary | ICD-10-CM | POA: Diagnosis not present

## 2022-06-01 DEATH — deceased
